# Patient Record
Sex: Male | Born: 1985 | Race: White | Hispanic: No | Marital: Single
Health system: Midwestern US, Community
[De-identification: ages and names within clinical notes are randomized; demographics above are authoritative.]

## PROBLEM LIST (undated history)

## (undated) DIAGNOSIS — Z9114 Patient's other noncompliance with medication regimen: Secondary | ICD-10-CM

## (undated) DIAGNOSIS — F329 Major depressive disorder, single episode, unspecified: Secondary | ICD-10-CM

## (undated) DIAGNOSIS — D649 Anemia, unspecified: Secondary | ICD-10-CM

## (undated) DIAGNOSIS — F250 Schizoaffective disorder, bipolar type: Secondary | ICD-10-CM

## (undated) DIAGNOSIS — J189 Pneumonia, unspecified organism: Secondary | ICD-10-CM

## (undated) DIAGNOSIS — M1990 Unspecified osteoarthritis, unspecified site: Secondary | ICD-10-CM

## (undated) DIAGNOSIS — F1910 Other psychoactive substance abuse, uncomplicated: Secondary | ICD-10-CM

## (undated) DIAGNOSIS — E1110 Type 2 diabetes mellitus with ketoacidosis without coma: Secondary | ICD-10-CM

## (undated) DIAGNOSIS — K219 Gastro-esophageal reflux disease without esophagitis: Secondary | ICD-10-CM

## (undated) DIAGNOSIS — B019 Varicella without complication: Secondary | ICD-10-CM

## (undated) DIAGNOSIS — F419 Anxiety disorder, unspecified: Secondary | ICD-10-CM

## (undated) DIAGNOSIS — F32A Depression, unspecified: Secondary | ICD-10-CM

## (undated) DIAGNOSIS — Z91148 Patient's other noncompliance with medication regimen for other reason: Secondary | ICD-10-CM

## (undated) DIAGNOSIS — J45909 Unspecified asthma, uncomplicated: Secondary | ICD-10-CM

## (undated) DIAGNOSIS — M419 Scoliosis, unspecified: Secondary | ICD-10-CM

## (undated) DIAGNOSIS — I10 Essential (primary) hypertension: Secondary | ICD-10-CM

## (undated) DIAGNOSIS — G8929 Other chronic pain: Secondary | ICD-10-CM

## (undated) DIAGNOSIS — E109 Type 1 diabetes mellitus without complications: Secondary | ICD-10-CM

## (undated) DIAGNOSIS — F259 Schizoaffective disorder, unspecified: Secondary | ICD-10-CM

## (undated) DIAGNOSIS — G43909 Migraine, unspecified, not intractable, without status migrainosus: Secondary | ICD-10-CM

## (undated) HISTORY — DX: Varicella without complication: B01.9

## (undated) HISTORY — DX: Essential (primary) hypertension: I10

---

## 2011-05-31 ENCOUNTER — Inpatient Hospital Stay (INDEPENDENT_AMBULATORY_CARE_PROVIDER_SITE_OTHER)
Admission: RE | Admit: 2011-05-31 | Discharge: 2011-05-31 | Disposition: A | Discharge status: Home / Self Care | Payer: Medicare Other | Source: Ambulatory Visit | Attending: Family Medicine | Admitting: Family Medicine

## 2011-05-31 ENCOUNTER — Inpatient Hospital Stay (HOSPITAL_COMMUNITY)
Admission: EM | Admit: 2011-05-31 | Discharge: 2011-06-01 | DRG: 639 | Disposition: A | Discharge status: Home / Self Care | Payer: Self-pay | Attending: Emergency Medicine | Admitting: Emergency Medicine

## 2011-05-31 ENCOUNTER — Emergency Department (HOSPITAL_COMMUNITY): Payer: Self-pay

## 2011-05-31 DIAGNOSIS — R824 Acetonuria: Secondary | ICD-10-CM

## 2011-05-31 DIAGNOSIS — F209 Schizophrenia, unspecified: Secondary | ICD-10-CM

## 2011-05-31 DIAGNOSIS — Z79899 Other long term (current) drug therapy: Secondary | ICD-10-CM

## 2011-05-31 DIAGNOSIS — F329 Major depressive disorder, single episode, unspecified: Secondary | ICD-10-CM | POA: Diagnosis present

## 2011-05-31 DIAGNOSIS — F259 Schizoaffective disorder, unspecified: Secondary | ICD-10-CM | POA: Diagnosis present

## 2011-05-31 DIAGNOSIS — E119 Type 2 diabetes mellitus without complications: Principal | ICD-10-CM | POA: Diagnosis present

## 2011-05-31 DIAGNOSIS — Z794 Long term (current) use of insulin: Secondary | ICD-10-CM

## 2011-05-31 DIAGNOSIS — F3289 Other specified depressive episodes: Secondary | ICD-10-CM | POA: Diagnosis present

## 2011-05-31 LAB — COMPREHENSIVE METABOLIC PANEL
ALT: 28 U/L (ref 0–53)
AST: 16 U/L (ref 0–37)
Albumin: 4.2 g/dL (ref 3.5–5.2)
Calcium: 9.7 mg/dL (ref 8.4–10.5)
Creatinine, Ser: 0.56 mg/dL (ref 0.4–1.5)
Sodium: 121 mEq/L — ABNORMAL LOW (ref 135–145)
Total Protein: 6.6 g/dL (ref 6.0–8.3)

## 2011-05-31 LAB — URINALYSIS, ROUTINE W REFLEX MICROSCOPIC
Bilirubin Urine: NEGATIVE
Glucose, UA: >1000 mg/dL — AB
Hgb urine dipstick: NEGATIVE
Specific Gravity, Urine: 1.043 — ABNORMAL HIGH (ref 1.005–1.030)
Urobilinogen, UA: 0.2 mg/dL (ref 0.0–1.0)
pH: 5.5 (ref 5.0–8.0)

## 2011-05-31 LAB — POCT I-STAT, CHEM 8
Calcium, Ion: 1.21 mmol/L (ref 1.12–1.32)
Chloride: 94 mEq/L — ABNORMAL LOW (ref 96–112)
Glucose, Bld: 479 mg/dL — ABNORMAL HIGH (ref 70–99)
HCT: 45 % (ref 39.0–52.0)
Hemoglobin: 15.3 g/dL (ref 13.0–17.0)
Potassium: 4.3 mEq/L (ref 3.5–5.1)

## 2011-05-31 LAB — POCT URINALYSIS DIP (DEVICE)
Leukocytes, UA: NEGATIVE
Protein, ur: NEGATIVE mg/dL
Urobilinogen, UA: 0.2 mg/dL (ref 0.0–1.0)
pH: 6 (ref 5.0–8.0)

## 2011-05-31 LAB — POCT I-STAT 3, ART BLOOD GAS (G3+)
O2 Saturation: 95 %
pCO2 arterial: 38.4 mmHg (ref 35.0–45.0)
pH, Arterial: 7.362 (ref 7.350–7.450)

## 2011-05-31 LAB — CBC
HCT: 41.6 % (ref 39.0–52.0)
MCHC: 37 g/dL — ABNORMAL HIGH (ref 30.0–36.0)
MCV: 84 fL (ref 78.0–100.0)
Platelets: 351 10*3/uL (ref 150–400)
RDW: 12.4 % (ref 11.5–15.5)
WBC: 12.2 10*3/uL — ABNORMAL HIGH (ref 4.0–10.5)

## 2011-05-31 LAB — DIFFERENTIAL
Basophils Absolute: 0.1 10*3/uL (ref 0.0–0.1)
Eosinophils Absolute: 0.1 10*3/uL (ref 0.0–0.7)
Eosinophils Relative: 1 % (ref 0–5)
Lymphocytes Relative: 16 % (ref 12–46)
Monocytes Absolute: 1.1 10*3/uL — ABNORMAL HIGH (ref 0.1–1.0)

## 2011-05-31 LAB — URINE MICROSCOPIC-ADD ON

## 2011-05-31 LAB — GLUCOSE, CAPILLARY
Glucose-Capillary: 585 mg/dL (ref 70–99)
Glucose-Capillary: 474 mg/dL — ABNORMAL HIGH (ref 70–99)
Glucose-Capillary: 333 mg/dL — ABNORMAL HIGH (ref 70–99)
Glucose-Capillary: 276 mg/dL — ABNORMAL HIGH (ref 70–99)
Glucose-Capillary: 226 mg/dL — ABNORMAL HIGH (ref 70–99)
Glucose-Capillary: 160 mg/dL — ABNORMAL HIGH (ref 70–99)

## 2011-06-01 LAB — GLUCOSE, CAPILLARY
Glucose-Capillary: 149 mg/dL — ABNORMAL HIGH (ref 70–99)
Glucose-Capillary: 174 mg/dL — ABNORMAL HIGH (ref 70–99)

## 2011-06-08 ENCOUNTER — Emergency Department (HOSPITAL_COMMUNITY)
Admission: EM | Admit: 2011-06-08 | Discharge: 2011-06-08 | Disposition: A | Discharge status: Home / Self Care | Payer: Self-pay | Attending: Emergency Medicine | Admitting: Emergency Medicine

## 2011-06-08 DIAGNOSIS — Z794 Long term (current) use of insulin: Secondary | ICD-10-CM | POA: Insufficient documentation

## 2011-06-08 DIAGNOSIS — E119 Type 2 diabetes mellitus without complications: Secondary | ICD-10-CM | POA: Insufficient documentation

## 2011-06-08 DIAGNOSIS — R000 Tachycardia, unspecified: Secondary | ICD-10-CM | POA: Insufficient documentation

## 2011-06-08 DIAGNOSIS — R5381 Other malaise: Secondary | ICD-10-CM | POA: Insufficient documentation

## 2011-06-08 LAB — BASIC METABOLIC PANEL
CO2: 28 mEq/L (ref 19–32)
Calcium: 9.8 mg/dL (ref 8.4–10.5)
Creatinine, Ser: 0.53 mg/dL (ref 0.50–1.35)
GFR calc non Af Amer: >60 mL/min (ref >60)
Glucose, Bld: 310 mg/dL — ABNORMAL HIGH (ref 70–99)

## 2011-06-08 LAB — GLUCOSE, CAPILLARY

## 2011-06-19 ENCOUNTER — Emergency Department (HOSPITAL_BASED_OUTPATIENT_CLINIC_OR_DEPARTMENT_OTHER)
Admission: EM | Admit: 2011-06-19 | Discharge: 2011-06-20 | Disposition: A | Discharge status: Other Acute Inpatient Hospital | Payer: Self-pay | Attending: Emergency Medicine | Admitting: Emergency Medicine

## 2011-06-19 DIAGNOSIS — F29 Unspecified psychosis not due to a substance or known physiological condition: Secondary | ICD-10-CM | POA: Insufficient documentation

## 2011-06-19 DIAGNOSIS — E119 Type 2 diabetes mellitus without complications: Secondary | ICD-10-CM | POA: Insufficient documentation

## 2011-06-19 LAB — URINALYSIS, ROUTINE W REFLEX MICROSCOPIC
Hgb urine dipstick: NEGATIVE
Nitrite: NEGATIVE
Specific Gravity, Urine: 1.038 — ABNORMAL HIGH (ref 1.005–1.030)
Urobilinogen, UA: 0.2 mg/dL (ref 0.0–1.0)
pH: 6.5 (ref 5.0–8.0)

## 2011-06-19 LAB — CBC
HCT: 42 % (ref 39.0–52.0)
MCV: 84.5 fL (ref 78.0–100.0)
RBC: 4.97 MIL/uL (ref 4.22–5.81)
RDW: 11.9 % (ref 11.5–15.5)
WBC: 7.9 10*3/uL (ref 4.0–10.5)

## 2011-06-19 LAB — DIFFERENTIAL
Basophils Absolute: 0.1 10*3/uL (ref 0.0–0.1)
Eosinophils Relative: 3 % (ref 0–5)
Lymphocytes Relative: 29 % (ref 12–46)
Lymphs Abs: 2.3 10*3/uL (ref 0.7–4.0)
Neutro Abs: 4.5 10*3/uL (ref 1.7–7.7)
Neutrophils Relative %: 58 % (ref 43–77)

## 2011-06-19 LAB — URINE MICROSCOPIC-ADD ON

## 2011-06-19 LAB — COMPREHENSIVE METABOLIC PANEL
Alkaline Phosphatase: 85 U/L (ref 39–117)
BUN: 20 mg/dL (ref 6–23)
Chloride: 98 mEq/L (ref 96–112)
Creatinine, Ser: <0.47 mg/dL — ABNORMAL LOW (ref 0.50–1.35)
Glucose, Bld: 211 mg/dL — ABNORMAL HIGH (ref 70–99)
Potassium: 3.8 mEq/L (ref 3.5–5.1)
Total Bilirubin: 0.4 mg/dL (ref 0.3–1.2)

## 2011-06-19 LAB — ETHANOL: Alcohol, Ethyl (B): <11 mg/dL (ref 0–11)

## 2011-06-19 LAB — RAPID URINE DRUG SCREEN, HOSP PERFORMED
Amphetamines: POSITIVE — AB
Cocaine: NOT DETECTED
Opiates: NOT DETECTED

## 2011-06-20 LAB — GLUCOSE, CAPILLARY
Glucose-Capillary: 109 mg/dL — ABNORMAL HIGH (ref 70–99)
Glucose-Capillary: 327 mg/dL — ABNORMAL HIGH (ref 70–99)

## 2011-08-10 ENCOUNTER — Emergency Department (HOSPITAL_COMMUNITY)
Admission: EM | Admit: 2011-08-10 | Discharge: 2011-08-10 | Disposition: A | Discharge status: Home / Self Care | Payer: Self-pay | Attending: Emergency Medicine | Admitting: Emergency Medicine

## 2011-08-10 DIAGNOSIS — Z794 Long term (current) use of insulin: Secondary | ICD-10-CM | POA: Insufficient documentation

## 2011-08-10 DIAGNOSIS — Z79899 Other long term (current) drug therapy: Secondary | ICD-10-CM | POA: Insufficient documentation

## 2011-08-10 DIAGNOSIS — F329 Major depressive disorder, single episode, unspecified: Secondary | ICD-10-CM | POA: Insufficient documentation

## 2011-08-10 DIAGNOSIS — E119 Type 2 diabetes mellitus without complications: Secondary | ICD-10-CM | POA: Insufficient documentation

## 2011-08-10 DIAGNOSIS — R350 Frequency of micturition: Secondary | ICD-10-CM | POA: Insufficient documentation

## 2011-08-10 DIAGNOSIS — F3289 Other specified depressive episodes: Secondary | ICD-10-CM | POA: Insufficient documentation

## 2011-08-10 LAB — POCT I-STAT, CHEM 8
BUN: 9 mg/dL (ref 6–23)
Calcium, Ion: 1.21 mmol/L (ref 1.12–1.32)
Chloride: 99 mEq/L (ref 96–112)
Creatinine, Ser: 0.5 mg/dL (ref 0.50–1.35)
Glucose, Bld: 390 mg/dL — ABNORMAL HIGH (ref 70–99)
HCT: 46 % (ref 39.0–52.0)
Hemoglobin: 15.6 g/dL (ref 13.0–17.0)
Potassium: 3.6 mEq/L (ref 3.5–5.1)
Sodium: 135 mEq/L (ref 135–145)
TCO2: 25 mmol/L (ref 0–100)

## 2011-08-10 LAB — GLUCOSE, CAPILLARY
Glucose-Capillary: 297 mg/dL — ABNORMAL HIGH (ref 70–99)
Glucose-Capillary: 254 mg/dL — ABNORMAL HIGH (ref 70–99)

## 2011-08-11 ENCOUNTER — Inpatient Hospital Stay (INDEPENDENT_AMBULATORY_CARE_PROVIDER_SITE_OTHER)
Admission: RE | Admit: 2011-08-11 | Discharge: 2011-08-11 | Disposition: A | Discharge status: Home / Self Care | Payer: Self-pay | Source: Ambulatory Visit | Attending: Emergency Medicine | Admitting: Emergency Medicine

## 2011-08-11 DIAGNOSIS — Z760 Encounter for issue of repeat prescription: Secondary | ICD-10-CM

## 2011-08-11 LAB — GLUCOSE, CAPILLARY: Glucose-Capillary: 441 mg/dL — ABNORMAL HIGH (ref 70–99)

## 2011-08-13 ENCOUNTER — Inpatient Hospital Stay (HOSPITAL_COMMUNITY)
Admission: EM | Admit: 2011-08-13 | Discharge: 2011-08-14 | DRG: 081 | Disposition: A | Discharge status: Home / Self Care | Payer: Self-pay | Attending: Family Medicine | Admitting: Family Medicine

## 2011-08-13 DIAGNOSIS — Z794 Long term (current) use of insulin: Secondary | ICD-10-CM

## 2011-08-13 DIAGNOSIS — T43505A Adverse effect of unspecified antipsychotics and neuroleptics, initial encounter: Secondary | ICD-10-CM | POA: Diagnosis present

## 2011-08-13 DIAGNOSIS — F259 Schizoaffective disorder, unspecified: Secondary | ICD-10-CM

## 2011-08-13 DIAGNOSIS — F329 Major depressive disorder, single episode, unspecified: Secondary | ICD-10-CM | POA: Diagnosis present

## 2011-08-13 DIAGNOSIS — T4275XA Adverse effect of unspecified antiepileptic and sedative-hypnotic drugs, initial encounter: Secondary | ICD-10-CM | POA: Diagnosis present

## 2011-08-13 DIAGNOSIS — IMO0001 Reserved for inherently not codable concepts without codable children: Secondary | ICD-10-CM | POA: Diagnosis present

## 2011-08-13 DIAGNOSIS — R404 Transient alteration of awareness: Principal | ICD-10-CM | POA: Diagnosis present

## 2011-08-13 DIAGNOSIS — F3289 Other specified depressive episodes: Secondary | ICD-10-CM | POA: Diagnosis present

## 2011-08-13 DIAGNOSIS — E876 Hypokalemia: Secondary | ICD-10-CM | POA: Diagnosis present

## 2011-08-13 LAB — URINALYSIS, ROUTINE W REFLEX MICROSCOPIC
Ketones, ur: 40 mg/dL — AB
Leukocytes, UA: NEGATIVE
Nitrite: NEGATIVE
pH: 6.5 (ref 5.0–8.0)

## 2011-08-13 LAB — CBC
MCH: 30.4 pg (ref 26.0–34.0)
MCV: 82.3 fL (ref 78.0–100.0)
Platelets: 196 10*3/uL (ref 150–400)
RDW: 12.3 % (ref 11.5–15.5)
WBC: 4.9 10*3/uL (ref 4.0–10.5)

## 2011-08-13 LAB — GLUCOSE, CAPILLARY
Glucose-Capillary: 267 mg/dL — ABNORMAL HIGH (ref 70–99)
Glucose-Capillary: 411 mg/dL — ABNORMAL HIGH (ref 70–99)
Glucose-Capillary: 428 mg/dL — ABNORMAL HIGH (ref 70–99)

## 2011-08-13 LAB — COMPREHENSIVE METABOLIC PANEL
BUN: 8 mg/dL (ref 6–23)
Calcium: 9.1 mg/dL (ref 8.4–10.5)
Creatinine, Ser: <0.47 mg/dL — ABNORMAL LOW (ref 0.50–1.35)
Glucose, Bld: 394 mg/dL — ABNORMAL HIGH (ref 70–99)
Total Protein: 6.2 g/dL (ref 6.0–8.3)

## 2011-08-13 LAB — RAPID URINE DRUG SCREEN, HOSP PERFORMED
Barbiturates: NOT DETECTED
Benzodiazepines: NOT DETECTED

## 2011-08-13 LAB — DIFFERENTIAL
Eosinophils Absolute: 0.1 10*3/uL (ref 0.0–0.7)
Eosinophils Relative: 2 % (ref 0–5)
Lymphs Abs: 1.1 10*3/uL (ref 0.7–4.0)
Monocytes Relative: 12 % (ref 3–12)

## 2011-08-13 LAB — SALICYLATE LEVEL: Salicylate Lvl: <2 mg/dL — ABNORMAL LOW (ref 2.8–20.0)

## 2011-08-13 LAB — URINE MICROSCOPIC-ADD ON

## 2011-08-14 LAB — GLUCOSE, CAPILLARY

## 2011-08-19 ENCOUNTER — Inpatient Hospital Stay (HOSPITAL_COMMUNITY)
Admission: EM | Admit: 2011-08-19 | Discharge: 2011-08-20 | DRG: 918 | Discharge status: Against Medical Advice | Payer: Self-pay | Attending: Internal Medicine | Admitting: Internal Medicine

## 2011-08-19 ENCOUNTER — Emergency Department (HOSPITAL_COMMUNITY): Payer: Self-pay

## 2011-08-19 DIAGNOSIS — IMO0002 Reserved for concepts with insufficient information to code with codable children: Secondary | ICD-10-CM | POA: Diagnosis present

## 2011-08-19 DIAGNOSIS — Z91199 Patient's noncompliance with other medical treatment and regimen due to unspecified reason: Secondary | ICD-10-CM

## 2011-08-19 DIAGNOSIS — R4182 Altered mental status, unspecified: Secondary | ICD-10-CM | POA: Diagnosis present

## 2011-08-19 DIAGNOSIS — Z882 Allergy status to sulfonamides status: Secondary | ICD-10-CM

## 2011-08-19 DIAGNOSIS — Z23 Encounter for immunization: Secondary | ICD-10-CM

## 2011-08-19 DIAGNOSIS — Z5900 Homelessness unspecified: Secondary | ICD-10-CM

## 2011-08-19 DIAGNOSIS — T50901A Poisoning by unspecified drugs, medicaments and biological substances, accidental (unintentional), initial encounter: Principal | ICD-10-CM | POA: Diagnosis present

## 2011-08-19 DIAGNOSIS — Z794 Long term (current) use of insulin: Secondary | ICD-10-CM

## 2011-08-19 DIAGNOSIS — Z79899 Other long term (current) drug therapy: Secondary | ICD-10-CM

## 2011-08-19 DIAGNOSIS — F17200 Nicotine dependence, unspecified, uncomplicated: Secondary | ICD-10-CM | POA: Diagnosis present

## 2011-08-19 DIAGNOSIS — Z560 Unemployment, unspecified: Secondary | ICD-10-CM

## 2011-08-19 DIAGNOSIS — F3289 Other specified depressive episodes: Secondary | ICD-10-CM | POA: Diagnosis present

## 2011-08-19 DIAGNOSIS — F329 Major depressive disorder, single episode, unspecified: Secondary | ICD-10-CM | POA: Diagnosis present

## 2011-08-19 DIAGNOSIS — F259 Schizoaffective disorder, unspecified: Secondary | ICD-10-CM | POA: Diagnosis present

## 2011-08-19 DIAGNOSIS — E1065 Type 1 diabetes mellitus with hyperglycemia: Secondary | ICD-10-CM | POA: Diagnosis present

## 2011-08-19 DIAGNOSIS — Z9119 Patient's noncompliance with other medical treatment and regimen: Secondary | ICD-10-CM

## 2011-08-19 DIAGNOSIS — R55 Syncope and collapse: Secondary | ICD-10-CM | POA: Diagnosis present

## 2011-08-19 LAB — DIFFERENTIAL
Lymphocytes Relative: 18 % (ref 12–46)
Lymphs Abs: 1.3 10*3/uL (ref 0.7–4.0)
Neutrophils Relative %: 75 % (ref 43–77)

## 2011-08-19 LAB — RAPID URINE DRUG SCREEN, HOSP PERFORMED
Amphetamines: POSITIVE — AB
Barbiturates: NOT DETECTED
Benzodiazepines: NOT DETECTED
Tetrahydrocannabinol: NOT DETECTED

## 2011-08-19 LAB — SALICYLATE LEVEL: Salicylate Lvl: <2 mg/dL — ABNORMAL LOW (ref 2.8–20.0)

## 2011-08-19 LAB — COMPREHENSIVE METABOLIC PANEL
AST: 8 U/L (ref 0–37)
Albumin: 3.5 g/dL (ref 3.5–5.2)
CO2: 25 mEq/L (ref 19–32)
Calcium: 9.6 mg/dL (ref 8.4–10.5)
Creatinine, Ser: 0.58 mg/dL (ref 0.50–1.35)
GFR calc non Af Amer: >60 mL/min (ref >60)

## 2011-08-19 LAB — CBC
HCT: 37.2 % — ABNORMAL LOW (ref 39.0–52.0)
Hemoglobin: 13.7 g/dL (ref 13.0–17.0)
MCV: 82.1 fL (ref 78.0–100.0)
RBC: 4.53 MIL/uL (ref 4.22–5.81)
WBC: 7 10*3/uL (ref 4.0–10.5)

## 2011-08-19 LAB — CK TOTAL AND CKMB (NOT AT ARMC)
CK, MB: 2.3 ng/mL (ref 0.3–4.0)
Relative Index: INVALID (ref 0.0–2.5)

## 2011-08-19 LAB — URINALYSIS, ROUTINE W REFLEX MICROSCOPIC
Bilirubin Urine: NEGATIVE
Hgb urine dipstick: NEGATIVE
Protein, ur: NEGATIVE mg/dL
Urobilinogen, UA: 0.2 mg/dL (ref 0.0–1.0)
pH: 7 (ref 5.0–8.0)

## 2011-08-19 LAB — GLUCOSE, CAPILLARY: Glucose-Capillary: 298 mg/dL — ABNORMAL HIGH (ref 70–99)

## 2011-08-19 LAB — URINE MICROSCOPIC-ADD ON

## 2011-08-19 LAB — TROPONIN I: Troponin I: <0.3 ng/mL (ref <0.30)

## 2011-08-19 LAB — ACETAMINOPHEN LEVEL: Acetaminophen (Tylenol), Serum: <15 ug/mL (ref 10–30)

## 2011-08-20 LAB — CBC
HCT: 33.8 % — ABNORMAL LOW (ref 39.0–52.0)
Hemoglobin: 12.1 g/dL — ABNORMAL LOW (ref 13.0–17.0)
WBC: 6.5 10*3/uL (ref 4.0–10.5)

## 2011-08-20 LAB — COMPREHENSIVE METABOLIC PANEL
ALT: 15 U/L (ref 0–53)
Albumin: 2.8 g/dL — ABNORMAL LOW (ref 3.5–5.2)
Alkaline Phosphatase: 58 U/L (ref 39–117)
Potassium: 3.8 mEq/L (ref 3.5–5.1)
Sodium: 137 mEq/L (ref 135–145)
Total Protein: 5.2 g/dL — ABNORMAL LOW (ref 6.0–8.3)

## 2011-08-20 LAB — GLUCOSE, CAPILLARY: Glucose-Capillary: 252 mg/dL — ABNORMAL HIGH (ref 70–99)

## 2011-08-20 LAB — LIPID PANEL
Cholesterol: 143 mg/dL (ref 0–200)
Total CHOL/HDL Ratio: 3.4 RATIO
VLDL: 22 mg/dL (ref 0–40)

## 2011-08-20 LAB — TROPONIN I: Troponin I: <0.3 ng/mL (ref <0.30)

## 2011-08-20 LAB — CK TOTAL AND CKMB (NOT AT ARMC)
CK, MB: 2.5 ng/mL (ref 0.3–4.0)
Relative Index: INVALID (ref 0.0–2.5)
Relative Index: INVALID (ref 0.0–2.5)
Total CK: 35 U/L (ref 7–232)

## 2011-08-22 NOTE — H&P (Signed)
NAMETHEADOR, JEZEWSKI NO.:  [id number]  MEDICAL RECORD NO.:  [id number]  LOCATION:  3302                         FACILITY:  [facility]  PHYSICIAN:  Isidor Holts, M.D.  DATE OF BIRTH:  10/23/1986  DATE OF ADMISSION:  08/19/2011 DATE OF DISCHARGE:                             HISTORY & PHYSICAL   PRIMARY MD:  Dr. Allena Katz, [facility].  CHIEF COMPLAINT:  Passed out, altered mental status, and possible drug overdose.  HISTORY OF PRESENT ILLNESS:  This is a 25 year old male, who is status post hospitalization from August 13, 2011 to August 14, 2011, for possible drug overdose including benzodiazepines, homelessness, and unemployed.  The patient is currently rambling, somewhat confused and hypersomnolent in the Emergency Department, so history is in the main, gleaned from ED MD and what can be made out from the patient's speech. According to ED MD, the patient walked into a restaurant to use the bathroom and then passed out.  EMS was called.  As fas as could be determined from the patient, he still homeless and had felt somewhat unwell and dizzy with blurred vision yesterday, ended up sleeping in a park close to restaurant.  Then, today he had woken up, gotten to the restaurant, asked to use the bathroom and then felt very weak, lay on the floor and "must have passed out."  He was brought to the Emergency Department.  He denies having taken "too many of my medications."  He states that he is currently taking Adderall, Geodon, trazodone, and Depakote.  He never utilized any of the other medications that were prescribed upon discharge.  PAST MEDICAL HISTORY: 1. Status post hospitalization from August 13, 2011 to August 14, 2011     for possible drug overdose, including benzodiazepines. 2. Diabetes mellitus type I. 3. Depression. 4. Schizoaffective disorder. 5. Homelessness. 6. Smoking history.  ALLERGIES:  Sulfa.  MEDICATIONS:  The patient currently takes the  following: 1. Adderall 10 mg p.o. daily. 2. Depakote ER 1000 mg p.o. at bedtime. 3. Geodon (80 mg) 2 tablets p.o. at bedtime. 4. Trazodone 300 mg p.o. at bedtime.  In addition to these, he was discharged on the following: 1. Humalog insulin subcutaneously per sliding scale. 2. Lantus 14 subcutaneously at bedtime. 3. Clonidine 0.1 mg p.o. at bedtime. 4. Clonazepam 0.5 mg p.o. p.r.n. t.i.d. for anxiety. 5. Ibuprofen 200 mg p.o. p.r.n. every 8 hourly for pain.  As described     above, the patient says that he does not take these later     medications, especially not since discharge from his latest     hospitalization.  REVIEW OF SYSTEMS:  Unobtainable at this time, although the patient denies abdominal pain, vomiting or diarrhea, cough, shortness of breath, or fever.  SOCIAL HISTORY:  The patient is currently unemployed, homeless, single, and smokes about half a pack of cigarettes per day.  Has no history of "illegal drug abuse or alcohol abuse."  FAMILY HISTORY:  According to the patient, both parents are living. However, he does not know how old they are or what their health status is.  PHYSICAL EXAMINATION:  VITAL SIGNS:  Temperature 98.7, blood pressure 111/78 mmHg, respiratory 16, heart rate 77 per  minute and regular, and pulse oximeter 100% on 2 L of oxygen.  The patient at the present time was moaning, stating that Foley catheter was "very painful."  Otherwise, rambling, although he intermittently does make sense allowing for a limited history to be obtained, otherwise not seen to be in obvious acute distress. HEENT:  No clinical pallor, no jaundice.  No conjunctival injection. Pupils are equally reactive normally to light.  Visible mucous membranes appear moist. NECK:  Supple.  JVP not seen.  No palpable lymphadenopathy.  No palpable goiter. CHEST:  Clinically clear to auscultation.  No wheezes or crackles. HEART:  S1, S2 heard normal, regular.  No murmurs. ABDOMEN:   Scaphoid.  Soft, nontender.  No palpable organomegaly or palpable masses.  Normal bowel sounds. LOWER EXTREMITY:  No pitting edema.  Palpable peripheral pulses. MUSCULOSKELETAL:  Appears unremarkable. CENTRAL NERVOUS SYSTEM:  No focal neurologic deficit on gross examination.  INVESTIGATIONS:  CBC; WBC 7.0, hemoglobin 13.7, hematocrit 82.1, and platelets 276.  Electrolytes; sodium 140, potassium 4.4, chloride 101, CO2 of 25, BUN 16, creatinine 0.58, and glucose 562.  CBG of 298.  Urine drug screen is positive only for amphetamines, alcohol level is less than 11.  Urinalysis is negative.  Chest x-ray on August 19, 2011, shows no acute cardiopulmonary disease.  Head CT scan on August 19, 2011, shows no acute intracranial abnormality.  A 12-lead EKG August 19, 2011, shows sinus rhythm regular, 96 per minute, left axis deviation, old Q-waves in V1 and V2, otherwise no acute ischemic changes.  This EKG is essentially unchanged from 12-lead EKG of August 13, 2011.  ASSESSMENT AND PLAN: 1. Syncopal episode/hypersomnolence and altered mental status.     Etiology is unclear at the present time, although this may be     indeed medication-induced and possibly secondary to inadvertent     overdose.  However, unable to substantiate for the present time.     Management will be supportive, with intravenous fluid hydration,     observation, regular neuro checks, telemetric monitoring.  The     patient will need step-down unit initially for close     observation, although I believe this will be for an exceedingly short     time.  2. Uncontrolled type I diabetes mellitus.  This is of course, secondary     to noncompliance with medications.  We shall commence the patient     on sliding scale insulin coverage for now and then adjust     medications as indicated.  3. Smoking history.  The patient was commenced on NicoDerm CQ patch,     however we will attempt to counsel him when altered mental  status     resolved.  4. Homelessness.  We shall involve clinical social worker for     appropriate input.  5. Psychiatric problems.  Unable to assess the patient's psychiatric     state at the present time.  However, he may indeed need psychiatric     reevaluation prior to discharge, given his background psychiatric     problems.     Further management will depend on clinical course.     Isidor Holts, M.D.     CO/MEDQ  D:  08/19/2011  T:  08/19/2011  Job:  [id number]  cc:   Dr. Allena Katz  Electronically Signed by Isidor Holts M.D. on 08/22/2011 04:54:09 PM

## 2011-08-24 NOTE — Discharge Summary (Signed)
[name], [name]:  [id number]  MEDICAL RECORD NO.:  [id number]  LOCATION:  1518                         FACILITY:  [facility]  PHYSICIAN:  Brendia Sacks, MD    DATE OF BIRTH:  1986/01/08  DATE OF ADMISSION:  08/13/2011 DATE OF DISCHARGE:  08/14/2011                              DISCHARGE SUMMARY   Primary care services include [facility] as well as [facility] for counseling and prescription medication.  Also the patient is going to be set up with [facility] [place].  CONDITION ON DISCHARGE:  Improved.  DISPOSITION:  Home.  DISCHARGE DIAGNOSES: 1. Somnolence secondary to prescribed psychiatric medications. 2. Report on admission of overdose, doubted. 3. Diabetes mellitus type 2, uncontrolled. 4. Depression. 5. Schizoaffective disorder.  HISTORY OF PRESENT ILLNESS:  This is a 25 year old man who was staying with friends and was brought to the emergency room for questionable history of overdose.  Poor history from EMS obtained from friends, the patient had been taking a lot of Klonopin.  There is concern for overdose.  However, the patient notes that with his prescribed medications, which include Depakote, Klonopin, Geodon, and trazodone that it is very difficult to arouse him from sleep.  He feels that most likely his friends tried to arouse him and when they were unable, they became concerned.  He adamantly denied any suicidal or homicidal ideation.  He denied suicide attempt and denied an overdose.  HOSPITAL COURSE:  [name] was admitted to the medical floor with a sitter.  He was seen in consultation with Psychiatry, who has cleared him for discharge home.  He is not felt to need any inpatient psychiatric services and not felt to be a danger to himself or to others.  No changes to his medication regimen were recommended.  The patient was monitored on telemetry, had no arrhythmias and is feeling quite well.  CONSULTATIONS:  Psychiatry,  recommendations as above.  PROCEDURES:  None.  IMAGING:  None.  MICROBIOLOGY:  None.  PERTINENT LABORATORY STUDIES: 1. Urine drug screen was negative to tricyclics.  Urine screen was     negative. 2. Acetaminophen level was negative, alcohol level was negative and     salicylate level was negative. 3. CBC was unremarkable. 4. Capillary blood sugar was moderately elevated; however, the patient     tells me that he has been on 40 units of Lantus, not 25 as medical     reconciliation reported. 5. Basic metabolic panel was otherwise unremarkable. 6. Urinalysis was essentially negative.  DISCHARGE INSTRUCTIONS:  The patient will be discharged home.  He is currently homeless and therefore he has been seen by social work and has been given bus passes as well as information on shelters.  He has also been set up with [facility] by social work and will continue with [facility] [facility] as well.  Social work is also in the process of trying to arrange [employer] [facility] [place] with him.  DISCHARGE INSTRUCTIONS:  As above.  ACTIVITIES:  Unrestricted.  DIET:  Recommend a diabetic diet.  DISCHARGE MEDICATIONS:  Nicotine transdermal patch 40 mg per 24 hours daily to skin if not smoking.  Resume the following home medications.  1. Humalog sliding scale as directed by his primary care physician.     Note, I  made no changes to this too. 2. Lantus 40 units subcutaneous q.h.s.  Note that this is actually his     chronic home dose. 3. Adderall 10 mg 1 tablet p.o. daily. 4. Clonazepam 0.5 mg p.o. t.i.d. as needed for anxiety. 5. Clonidine 0.1 mg p.o. q.h.s. 6. Depakote ER 500 mg 2 tablets p.o. q.h.s. 7. Geodon 80 mg 2 tablets p.o. q.h.s. 8. Ibuprofen 200 mg every 8 hours as needed for pain. 9. Trazodone 100 mg 3 tablets p.o. q.h.s.  The patient should continue to follow with counseling as well as follow up for his diabetes.  Time coordinating discharge is 26 minutes.     Brendia Sacks,  MD     DG/MEDQ  D:  08/14/2011  T:  08/14/2011  Job:  [id number]  Electronically Signed by Brendia Sacks  on 08/24/2011 09:59:56 PM

## 2011-08-24 NOTE — Consult Note (Signed)
[name], [name]:  [id number]  MEDICAL RECORD NO.:  [id number]  LOCATION:  1518                         FACILITY:  [facility]  PHYSICIAN:  Conni Slipper, MDDATE OF BIRTH:  [birth year]  DATE OF CONSULTATION:  08/13/2011 DATE OF DISCHARGE:                                CONSULTATION   HISTORY:  [name] is a 25 year old single Caucasian male who was [name] with questionable overdose on his medications, hypokalemia, and schizoaffective disorder.  The patient was brought into the hospital by the EMS when his roommate from the [facility] called regarding unresponsive behavior.  [name] stated that he has been in [place] about 2 months initially came to live with a friend from his school and later he was found his friend is a Higher education careers adviser and robed him and left him at the shelter.  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  The patient reported that he has been suffering with auditory hallucinations, mostly conversations, constantly going on his head which resulted given a diagnosis of schizophrenia in 2008 in [place].  The patient reportedly has a suicidal attempts resulted being admitted over there.  The patient reported his recent admission was couple of weeks ago in [place] when he tried to abuse the system by telling them he was suicidal when actually he was homeless.  The patient reportedly taking medications Depakote 1000 mg a day, Geodon 160 mg a day, trazodone up to 450 mg a day from [facility] [place].  He has reported name of the doctor is Dr. Migdalia Dk.  PAST PSYCHIATRIC HISTORY:  Significant for both inpatient and outpatient psychiatric services since 2008.  He was on medication like  Seroquel, Invega, Haldol, Cogentin, and Klonopin in the past.  The patient reported currently he does not take Klonopin, but he takes Depakote, Geodon, and trazodone.  He does not have a history of substance abuse or alcohol.  Medically, there is a questionable diabetes, the patient was not conformed it.  ALLERGIES:  No known drug allergies.  Psychosocially, the patient reportedly lived in [place] and [place]. His mom was living with his sister who is not doing well.  His brother was not found and his step-dad was not helpful.  He has aunt who kill herself with overdose, also suffered with a mental illness.  SOCIAL HISTORY:  The patient was homeless, living in [place] and shelters and trying to find social security disability income and struggling to live in [place].  MENTAL STATUS EXAMINATION:  He appeared as per his stated age.  He is tall, slender, lying down in hospital bed with sheets covered up to his waist.  There is a staff next to him for observation.  He stated mood was fine.  His affect was appropriate.  He has a normal rate, rhythm, and volume of speech.  His thought process is linear and goal directed. He has denied suicidal or homicidal ideation, intention,  or plan.  He has no evidence of paranoia, visual hallucinations.  He endorses auditory hallucinations, which were chronic in nature.  He has a fair insight, judgment, and impulse control.  DIAGNOSES:  AXIS I:  Schizoaffective disorder by history, questionable overdose on his medications. AXIS II:  Deferred. AXIS III:  None. AXIS IV:  Problems with moderate-to-severe psychosocial stressors, homeless, unemployment, financial problems, place to live, and being living on and off in different places for the last 2-3 months in [place].  He has been in contact with his mother who is a little helpful to him financially. Axis V:  GAF was 40-50.  TREATMENT PLAN:  I reviewed the case with the patient and Dr.  Irene Limbo. Hospitalist recommended continue his medication management Depakote 1000 mg at bedtime, Geodon 160 mg at bedtime, trazodone 300-450 mg at bedtime, but in which needed to be verified from the pharmacy.  The patient does not meet criteria for acute psychiatric hospitalizations due to repeated recurrent denial of the suicidal or homicidal ideations. No danger to himself or others.  The patient referred to the case manager/social worker for the appropriate psychosocial support.  The patient will be required outpatient psychiatric services upon discharge medically.     Conni Slipper, MD     JRJ/MEDQ  D:  08/13/2011  T:  08/14/2011  Job:  [id number]  Electronically Signed by Leata Mouse MD on 08/24/2011 01:48:41 PM

## 2011-08-24 NOTE — H&P (Signed)
NAMETREVELL, PARISEAU NO.:  [id number]  MEDICAL RECORD NO.:  [id number]  LOCATION:  WLED                         FACILITY:  [facility]  PHYSICIAN:  Brendia Sacks, MD    DATE OF BIRTH:  12-29-1985  DATE OF ADMISSION:  08/13/2011 DATE OF DISCHARGE:                             HISTORY & PHYSICAL   REFERRING PHYSICIAN:  Tinnie Gens P. Caporossi, MD  PRIMARY CARE PHYSICIAN:  [facility], Dr. Allena Katz.  CHIEF COMPLAINT:  "No clue."  HISTORY OF PRESENT ILLNESS:  This is a 25 year old man who presents with a history of overdose.  Per the review of ER physician's documentation and EMS, the patient was found in his hotel room this morning with pills missing from his trazodone and Klonopin bottle with witnesses having watched him take a significant amount of Klonopin.  Per these records, he was taking half a tablet of his Klonopin every 30 minutes and pills were noted to be missing from at least his Klonopin bottle.  EMS was called and the patient was obtunded on their arrival.  They had difficulty awakening him.  He was taken to the emergency room and Poison Control was contacted.  Supportive care was main treatment for him.  I reviewed the ER documentation which has some conflicting information. It was noted that the patient was unresponsive in the HPI and obtunded on arrival, but also that the history was provided by the patient.  Per ER physical examination, the patient is noted to be in no acute distress.  Respiratory status was stable and he is noted to be alert and oriented x4 with no abnormalities in mood or affect.  Again, he was noted to be obtunded in the ER, arising to sternal rub.  It is noted that about 20 Klonopin missing from his bottle as well as 10 trazodone missing from his bottle.  The patient himself has no idea why he is here.  He thinks he has been staying with someone and he believes that he took all of his medications and went to bed and his friend  tried to awaken him and when he had difficulty he called EMS.  The patient notes that he has extreme difficulty waking when he takes his prescribed medications for his psychiatric disorders.  He absolutely denies taking extra Klonopin or overdosed in anyway.  He denies suicidal or homicidal ideation.  He would not be surprised if the people who were in the hotel room with him took some of this medication.  The patient was placed in involuntary commitment and referred for admission by the emergency room physician.  REVIEW OF SYSTEMS:  Negative for fever, changes to his vision, sore throat, rash, muscle aches, chest pain, shortness of breath, nausea, vomiting, abdominal pain, diarrhea, dysuria, or bleeding.  PAST MEDICAL HISTORY: 1. Diabetes mellitus. 2. Depression. 3. Schizo-affective disorder.  PAST SURGICAL HISTORY:  None.  SOCIAL HISTORY:  One half pack per day of tobacco.  No alcohol.  No drugs.  ALLERGIES:  SULFA, he has no idea what his reaction is.  FAMILY HISTORY:  Negative for first-degree coronary artery disease.  MAINTENANCE MEDICATIONS: 1. Klonopin 0.5 mg p.o. t.i.d. as needed for anxiety. 2. Depakote ER 500  mg 2 capsules p.o. q.h.s. 3. Humalog mix 50/50 sliding scale t.i.d. 4. Ibuprofen 200 mg p.o. every 8 hours as needed for pain. 5. Trazodone 100 mg p.o. 3 tablets nightly. 6. Lantus 25 units subcutaneous nightly. 7. Geodon 80 mg 2 tablets p.o. nightly. 8. Clonidine 0.1 mg p.o. nightly. 9. Adderall 10 mg p.o. daily.  PHYSICAL EXAMINATION:  VITAL SIGNS:  Blood pressure 111/60, pulse 113, respirations 16, temperature 98.2, saturation 98%. GENERAL:  The patient was examined in the ER, currently lying on a stretcher.  He is in no acute distress.  He is alert and awake and answers questions appropriately.  He was able to provide his own review of systems and further history. HEENT:  Head appears to be normal.  Eyes, sclerae clear.  Pupils are equal, round,  reactive to light with irides and conjunctivae appear unremarkable.  ENT, hearing is grossly normal.  Lips and tongue appear unremarkable. NECK:  Supple.  No lymphadenopathy or masses.  No thyromegaly. CHEST:  Clear to auscultation bilaterally.  No wheezes, rales, or rhonchi.  There is normal respiratory effort. ABDOMEN:  Soft, nontender, and nondistended.  Nontender to palpation. SKIN:  Normal without rash or indurations.  Nontender to palpation. EXTREMITIES:  Tone in the upper and lower extremities appears to be grossly normal. PSYCHIATRIC:  Grossly normal mood and affect.  Speech is fluent and appropriate.  ANCILLARY STUDIES:  EKG independently reviewed shows a mild sinus tachycardia, no acute changes.  IMAGING:  None.  PERTINENT LABORATORY STUDIES: 1. CBC is unremarkable. 2. Basic metabolic panel was notable for potassium of 3.3 and glucose     of 394. 3. Hepatic function panel was unremarkable. 4. Acetaminophen level and salicylate levels were negative. 5. Urine drug screen was negative. 6. Alcohol level was negative. 7. Urinalysis was essentially unremarkable.  ASSESSMENT AND PLAN:  This is a 25 year old man who presents with a reported history of overdose. 1. Reported overdose of Klonopin and trazodone.  Main treatment is     supportive care.  Monitor for respiratory depression and cardiac     arrhythmias.  The patient will be admitted to the step-down unit.     The patient adamantly denies any suicidal or homicidal ideation or     overdose; however, there is conflicting information here.  He has     already been placed on involuntary commitment by Dr. Weldon Inches.  We     will ask Psychiatry to see in consultation to provide further     evaluation and recommendations.  We will hold his sedating     medications at this point and follow him. 2. Diabetes mellitus type 2, probably uncontrolled.  We will place him     back on his Lantus and place him on sliding scale insulin  here. 3. Hypokalemia.  We will replete. 4. Depression, schizo-affective disorder.  We will continue him on his     Depakote.  We will ask Psychiatry for further evaluation and     recommendations.     Brendia Sacks, MD     DG/MEDQ  D:  08/13/2011  T:  08/13/2011  Job:  [id number]  Electronically Signed by Brendia Sacks  on 08/24/2011 10:00:04 PM

## 2011-08-28 NOTE — Discharge Summary (Signed)
  NAMEVALERIA, [name]:  [id number]  MEDICAL RECORD NO.:  [id number]  LOCATION:  3302                         FACILITY:  [facility]  PHYSICIAN:  [name], M.D.DATE OF BIRTH:  11-Aug-1986  DATE OF ADMISSION:  08/19/2011 DATE OF DISCHARGE:  08/20/2011                              DISCHARGE SUMMARY   PRIMARY CARE PROVIDER:  Dr. Allena [name], of [facility].  DISCHARGE DIAGNOSES: 1. Altered mental status felt to be secondary to medication ingestion     although it is not clear what the patient was taking, this ought to     be a complete accidental overdose. 2. Controlled diabetes mellitus type 1. 3. Tobacco abuse. 4. History of schizoaffective disorder. 5. History of depression.  DISCHARGE MEDICATIONS:  The patient is leaving against medical advice. He will likely resume on his previous medications which are listed in his previous discharge summary: 1. Adderall 10 mg p.o. daily. 2. Depakote ER 1000 p.o. at bedtime. 3. Geodon 160 p.o. at bedtime. 4. Trazodone 300 at bedtime. 5. Insulin. 6. Clonidine 0.1 p.o. at bedtime. 7. Clonazepam 0.5 p.o. p.r.n. t.i.d. for anxiety. 8. Motrin 200 p.o. p.r.n. every 8 hours for pain.  HOSPITAL COURSE:  The patient is a 25 year old white male with past medical history as above who was discharged from the hospitalist service 5 days ago for somnolence secondary to prescribed psychiatric medications.  He presented back again with passed out and rambling, incoherent, to be hypersomnolent.  He was admitted and watched overnight.  By the morning of August 20, 2011, he was fully wide awake.  He was amenable to getting subcu sliding-scale insulin but would not allow anything else to be done.  He asked to immediately leave and was not willing to wait 30 minutes for myself, the hospitalist to come and evaluate him for formally discharging the patient.  I discussed this case with his nurse on call.  The patient's vital  signs are stable.  His only abnormal labs were elevated CBG levels for which he is going to receive sliding scale.  He admits to have been inconsistently taking his insulin medications.  His other labs were unremarkable and he left against medical advice.     [name], M.D.     SKK/MEDQ  D:  08/20/2011  T:  08/20/2011  Job:  [id number]  cc:   [facility]  Electronically Signed by [name] M.D. on 08/28/2011 04:40:41 PM

## 2011-09-16 ENCOUNTER — Emergency Department (HOSPITAL_COMMUNITY)
Admission: EM | Admit: 2011-09-16 | Discharge: 2011-09-16 | Disposition: A | Discharge status: Home / Self Care | Payer: Self-pay | Attending: Emergency Medicine | Admitting: Emergency Medicine

## 2011-09-16 DIAGNOSIS — R109 Unspecified abdominal pain: Secondary | ICD-10-CM | POA: Insufficient documentation

## 2011-09-16 DIAGNOSIS — F3289 Other specified depressive episodes: Secondary | ICD-10-CM | POA: Insufficient documentation

## 2011-09-16 DIAGNOSIS — R112 Nausea with vomiting, unspecified: Secondary | ICD-10-CM | POA: Insufficient documentation

## 2011-09-16 DIAGNOSIS — F329 Major depressive disorder, single episode, unspecified: Secondary | ICD-10-CM | POA: Insufficient documentation

## 2011-09-16 DIAGNOSIS — R3589 Other polyuria: Secondary | ICD-10-CM | POA: Insufficient documentation

## 2011-09-16 DIAGNOSIS — R358 Other polyuria: Secondary | ICD-10-CM | POA: Insufficient documentation

## 2011-09-16 DIAGNOSIS — Z794 Long term (current) use of insulin: Secondary | ICD-10-CM | POA: Insufficient documentation

## 2011-09-16 DIAGNOSIS — E119 Type 2 diabetes mellitus without complications: Secondary | ICD-10-CM | POA: Insufficient documentation

## 2011-09-16 DIAGNOSIS — Z79899 Other long term (current) drug therapy: Secondary | ICD-10-CM | POA: Insufficient documentation

## 2011-09-16 LAB — GLUCOSE, CAPILLARY
Glucose-Capillary: 372 mg/dL — ABNORMAL HIGH (ref 70–99)
Glucose-Capillary: 235 mg/dL — ABNORMAL HIGH (ref 70–99)
Glucose-Capillary: 398 mg/dL — ABNORMAL HIGH (ref 70–99)
Glucose-Capillary: 282 mg/dL — ABNORMAL HIGH (ref 70–99)

## 2011-09-16 LAB — COMPREHENSIVE METABOLIC PANEL
ALT: 17 U/L (ref 0–53)
AST: 11 U/L (ref 0–37)
Albumin: 4.5 g/dL (ref 3.5–5.2)
Alkaline Phosphatase: 92 U/L (ref 39–117)
BUN: 14 mg/dL (ref 6–23)
CO2: 32 mEq/L (ref 19–32)
Calcium: 10.4 mg/dL (ref 8.4–10.5)
Chloride: 90 mEq/L — ABNORMAL LOW (ref 96–112)
Creatinine, Ser: 0.6 mg/dL (ref 0.50–1.35)
GFR calc Af Amer: >60 mL/min (ref >60)
GFR calc non Af Amer: >60 mL/min (ref >60)
Glucose, Bld: 573 mg/dL (ref 70–99)
Potassium: 4 mEq/L (ref 3.5–5.1)
Sodium: 132 mEq/L — ABNORMAL LOW (ref 135–145)
Total Bilirubin: 0.5 mg/dL (ref 0.3–1.2)
Total Protein: 7.5 g/dL (ref 6.0–8.3)

## 2011-09-16 LAB — URINALYSIS, ROUTINE W REFLEX MICROSCOPIC
Glucose, UA: >1000 mg/dL — AB
Leukocytes, UA: NEGATIVE
Nitrite: NEGATIVE
pH: 7.5 (ref 5.0–8.0)

## 2011-09-16 LAB — CBC
HCT: 45.4 % (ref 39.0–52.0)
MCH: 30.7 pg (ref 26.0–34.0)
MCV: 83.5 fL (ref 78.0–100.0)
Platelets: 306 10*3/uL (ref 150–400)
RBC: 5.44 MIL/uL (ref 4.22–5.81)

## 2011-09-16 LAB — DIFFERENTIAL
Basophils Absolute: 0.1 10*3/uL (ref 0.0–0.1)
Basophils Relative: 1 % (ref 0–1)
Eosinophils Absolute: 0.1 10*3/uL (ref 0.0–0.7)
Eosinophils Relative: 1 % (ref 0–5)
Lymphocytes Relative: 21 % (ref 12–46)
Lymphs Abs: 2.1 10*3/uL (ref 0.7–4.0)
Monocytes Absolute: 0.7 10*3/uL (ref 0.1–1.0)
Monocytes Relative: 7 % (ref 3–12)
Neutro Abs: 6.7 10*3/uL (ref 1.7–7.7)
Neutrophils Relative %: 70 % (ref 43–77)

## 2011-09-16 LAB — POCT I-STAT 3, VENOUS BLOOD GAS (G3P V)
Acid-Base Excess: 4 mmol/L — ABNORMAL HIGH (ref 0.0–2.0)
Bicarbonate: 31.3 mEq/L — ABNORMAL HIGH (ref 20.0–24.0)
O2 Saturation: 49 %
TCO2: 33 mmol/L (ref 0–100)

## 2011-09-16 LAB — URINE MICROSCOPIC-ADD ON

## 2011-11-18 HISTORY — PX: INCISION AND DRAINAGE ABSCESS: SHX5864

## 2011-12-12 ENCOUNTER — Emergency Department (HOSPITAL_COMMUNITY)
Admission: EM | Admit: 2011-12-12 | Discharge: 2011-12-13 | Disposition: A | Discharge status: Home / Self Care | Payer: Self-pay | Attending: Emergency Medicine | Admitting: Emergency Medicine

## 2011-12-12 ENCOUNTER — Encounter: Payer: Self-pay | Admitting: Emergency Medicine

## 2011-12-12 DIAGNOSIS — L02818 Cutaneous abscess of other sites: Secondary | ICD-10-CM | POA: Insufficient documentation

## 2011-12-12 DIAGNOSIS — L02811 Cutaneous abscess of head [any part, except face]: Secondary | ICD-10-CM

## 2011-12-12 DIAGNOSIS — S0100XA Unspecified open wound of scalp, initial encounter: Secondary | ICD-10-CM | POA: Insufficient documentation

## 2011-12-12 DIAGNOSIS — X58XXXA Exposure to other specified factors, initial encounter: Secondary | ICD-10-CM | POA: Insufficient documentation

## 2011-12-12 NOTE — ED Notes (Signed)
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 No bleeding noted, hard to touch, redness in the area, above the skin level.

## 2011-12-13 ENCOUNTER — Emergency Department (HOSPITAL_COMMUNITY): Payer: Self-pay

## 2011-12-13 MED ORDER — DOXYCYCLINE HYCLATE 100 MG PO CAPS: 100.0000 mg | ORAL_CAPSULE | Freq: Two times a day (BID) | ORAL | Status: DC

## 2011-12-13 MED ORDER — TRAMADOL HCL 50 MG PO TABS: 50.0000 mg | ORAL_TABLET | Freq: Four times a day (QID) | ORAL | Status: DC | PRN

## 2011-12-13 MED ORDER — TRAMADOL HCL 50 MG PO TABS
50.0000 mg | ORAL_TABLET | Freq: Once | ORAL | Status: AC
  Administered 2011-12-13: 50 mg via ORAL
  Filled 2011-12-13: qty 1

## 2011-12-13 MED ORDER — LIDOCAINE HCL 2 % IJ SOLN
INTRAMUSCULAR | Status: AC
  Administered 2011-12-13: 01:00:00
  Filled 2011-12-13: qty 1

## 2011-12-13 NOTE — ED Provider Notes (Signed)
History     CSN: [id number]  Arrival date & time 12/12/11  2207   First MD Initiated Contact with Patient 12/13/11 0006      Chief Complaint  Patient presents with  . Abscess  . Cyst    (Consider location/radiation/quality/duration/timing/severity/associated sxs/prior treatment) Patient is a 25 y.o. male presenting with abscess. The history is provided by the patient.  Abscess  This is a new problem. Episode onset: 5 days ago. The problem has been unchanged. The abscess is present on the scalp. The problem is moderate. The abscess is characterized by redness, painfulness and swelling. The abscess first occurred at home.  Pt states his brother tried to squeeze it but nothing came out. States it is not improving. Denies fever, chills, malaise. 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 No pertinent past medical history.  History reviewed. No pertinent past surgical history.  History reviewed. No pertinent family history.  History  Substance Use Topics  . Smoking status: Current Everyday Smoker  . Smokeless tobacco: Not on file  . Alcohol Use: No      Review of Systems  Constitutional: Negative.   HENT: Negative.   Eyes: Negative.   Cardiovascular: Negative.   Gastrointestinal: Negative.   Genitourinary: Negative.   Musculoskeletal: Negative.   Skin: Positive for wound.  Neurological: Negative.   Psychiatric/Behavioral: Negative.     Allergies  Sulfa antibiotics  Home Medications  No current outpatient prescriptions on file.  BP 131/89  Pulse 103  Resp 20  SpO2 100%  Physical Exam  Nursing note and vitals reviewed. Constitutional: He is oriented to person, place, and time. He appears well-developed and well-nourished.  HENT:  Head: Normocephalic.  Eyes: Conjunctivae are normal.  Neck: Normal range of motion. Neck supple.  Cardiovascular: Normal rate, regular rhythm and normal heart  sounds.   Pulmonary/Chest: Effort normal and breath sounds normal. No respiratory distress.  Neurological: He is alert and oriented to person, place, and time.  Skin: Skin is warm and dry.       3cm indurated area to the posterior midline scalp, with central scabbing. Tender to palpation. NO surrounding erythema.   Psychiatric: He has a normal mood and affect.    ED Course  Procedures (including critical care time)    INCISION AND DRAINAGE Performed by: Jaynie Crumble A Consent: Verbal consent obtained. Risks and benefits: risks, benefits and alternatives were discussed Type: abscess  Body area: scalp  Anesthesia: local infiltration  Local anesthetic: lidocaine 2% w/o epinephrine  Anesthetic total: 2 ml  Complexity: complex Blunt dissection to break up loculations  Drainage: purulent  Drainage amount: minimal  Packing material: 1/4 in iodoform gauze  Patient tolerance: Patient tolerated the procedure well with no immediate complications.   Dg Skull 1-3 Views  12/13/2011  *RADIOLOGY REPORT*  Clinical Data: Laceration to the occiput; the patient works with metal.  Assess for metal embedded within the soft tissues.  SKULL - 1-3 VIEW  Comparison: CT of the head performed 08/19/2011  Findings: No radiopaque foreign bodies are identified.  The calvarium is grossly unremarkable in appearance.  There is no evidence of fracture.  The visualized paranasal sinuses and mastoid air cells are well-aerated.  Diffuse dental fillings are noted.  No significant soft tissue abnormalities are characterized on radiograph.  IMPRESSION: No radiopaque foreign bodies seen.  Original Report Authenticated By: Tonia Ghent, M.D.   No foreign body. Will d/c home  MDM  Lottie Mussel, [place] 12/13/11 [phone]

## 2011-12-14 ENCOUNTER — Emergency Department (INDEPENDENT_AMBULATORY_CARE_PROVIDER_SITE_OTHER)
Admission: EM | Admit: 2011-12-14 | Discharge: 2011-12-14 | Disposition: A | Discharge status: Home / Self Care | Payer: Self-pay | Source: Home / Self Care | Attending: Emergency Medicine | Admitting: Emergency Medicine

## 2011-12-14 ENCOUNTER — Encounter (HOSPITAL_COMMUNITY): Payer: Self-pay

## 2011-12-14 DIAGNOSIS — L089 Local infection of the skin and subcutaneous tissue, unspecified: Secondary | ICD-10-CM

## 2011-12-14 MED ORDER — IBUPROFEN 800 MG PO TABS: 800.0000 mg | ORAL_TABLET | Freq: Three times a day (TID) | ORAL | Status: DC

## 2011-12-14 NOTE — ED Provider Notes (Signed)
History     CSN: [id number]  Arrival date & time 12/14/11  1726   First MD Initiated Contact with Patient 12/14/11 1743      Chief Complaint  Patient presents with  . Wound Check    (Consider location/radiation/quality/duration/timing/severity/associated sxs/prior treatment) Patient is a 25 y.o. male presenting with wound check. The history is provided by the patient.  Wound Check  He was treated in the ED 5 to 10 days ago. Previous treatment in the ED includes I&D of abscess. Treatments since wound repair include oral antibiotics. There has been clear discharge from the wound. There is no swelling present. The pain has no pain.    Past Medical History  Diagnosis Date  . Diabetes mellitus     History reviewed. No pertinent past surgical history.  No family history on file.  History  Substance Use Topics  . Smoking status: Current Everyday Smoker -- 0.5 packs/day  . Smokeless tobacco: Not on file  . Alcohol Use: No      Review of Systems  Allergies  Sulfa antibiotics  Home Medications   Current Outpatient Rx  Name Route Sig Dispense Refill  . DOXYCYCLINE HYCLATE 100 MG PO CAPS Oral Take 1 capsule (100 mg total) by mouth 2 (two) times daily. 14 capsule 0  . INSULIN GLARGINE 100 UNIT/ML [place] SOLN Subcutaneous Inject 25 Units into the skin at bedtime.      . INSULIN LISPRO (HUMAN) 100 UNIT/ML [place] SOLN Subcutaneous Inject 10 Units into the skin 3 (three) times daily before meals.      . TRAMADOL HCL 50 MG PO TABS Oral Take 1 tablet (50 mg total) by mouth every 6 (six) hours as needed for pain. Maximum dose= 8 tablets per day 15 tablet 0  . IBUPROFEN 800 MG PO TABS Oral Take 1 tablet (800 mg total) by mouth 3 (three) times daily. 21 tablet 0    BP 124/79  Pulse 114  Temp(Src) 99 F (37.2 C) (Oral)  Resp 16  SpO2 100%  Physical Exam  Nursing note and vitals reviewed. Constitutional: He appears well-developed and well-nourished.  Skin: There is erythema.        ED Course  Procedures (including critical care time)  Labs Reviewed - No data to display Dg Skull 1-3 Views  12/13/2011  *RADIOLOGY REPORT*  Clinical Data: Laceration to the occiput; the patient works with metal.  Assess for metal embedded within the soft tissues.  SKULL - 1-3 VIEW  Comparison: CT of the head performed 08/19/2011  Findings: No radiopaque foreign bodies are identified.  The calvarium is grossly unremarkable in appearance.  There is no evidence of fracture.  The visualized paranasal sinuses and mastoid air cells are well-aerated.  Diffuse dental fillings are noted.  No significant soft tissue abnormalities are characterized on radiograph.  IMPRESSION: No radiopaque foreign bodies seen.  Original Report Authenticated By: Tonia Ghent, M.D.     1. Superficial injury of scalp with infection       MDM  Scalp- injury        Jimmie Molly, MD 12/14/11 1932

## 2011-12-14 NOTE — ED Notes (Signed)
Seen in ED 2 days ago for abscess to posterior scalp.  Here for recheck as directed.  States compliant with antibiotics and pain med.  Reports area is more painful today.  Has not changed dressing.

## 2011-12-14 NOTE — ED Provider Notes (Signed)
Medical screening examination/treatment/procedure(s) were performed by non-physician practitioner and as supervising physician I was immediately available for consultation/collaboration.   Kaiyden Simkin, MD 12/14/11 0750 

## 2011-12-18 ENCOUNTER — Emergency Department (HOSPITAL_COMMUNITY): Payer: Worker's Compensation

## 2011-12-18 ENCOUNTER — Inpatient Hospital Stay (HOSPITAL_COMMUNITY)
Admission: EM | Admit: 2011-12-18 | Discharge: 2011-12-22 | DRG: 581 | Disposition: A | Discharge status: Home / Self Care | Payer: Worker's Compensation | Source: Ambulatory Visit | Attending: Orthopedic Surgery | Admitting: Orthopedic Surgery

## 2011-12-18 ENCOUNTER — Encounter (HOSPITAL_COMMUNITY): Payer: Self-pay

## 2011-12-18 DIAGNOSIS — E119 Type 2 diabetes mellitus without complications: Secondary | ICD-10-CM | POA: Diagnosis present

## 2011-12-18 DIAGNOSIS — Z794 Long term (current) use of insulin: Secondary | ICD-10-CM

## 2011-12-18 DIAGNOSIS — L02519 Cutaneous abscess of unspecified hand: Principal | ICD-10-CM | POA: Diagnosis present

## 2011-12-18 DIAGNOSIS — F329 Major depressive disorder, single episode, unspecified: Secondary | ICD-10-CM | POA: Diagnosis present

## 2011-12-18 DIAGNOSIS — F3289 Other specified depressive episodes: Secondary | ICD-10-CM | POA: Diagnosis present

## 2011-12-18 DIAGNOSIS — F17200 Nicotine dependence, unspecified, uncomplicated: Secondary | ICD-10-CM | POA: Diagnosis present

## 2011-12-18 DIAGNOSIS — L03114 Cellulitis of left upper limb: Secondary | ICD-10-CM

## 2011-12-18 DIAGNOSIS — X58XXXA Exposure to other specified factors, initial encounter: Secondary | ICD-10-CM | POA: Diagnosis present

## 2011-12-18 DIAGNOSIS — S61209A Unspecified open wound of unspecified finger without damage to nail, initial encounter: Secondary | ICD-10-CM | POA: Diagnosis present

## 2011-12-18 DIAGNOSIS — Y990 Civilian activity done for income or pay: Secondary | ICD-10-CM

## 2011-12-18 HISTORY — DX: Major depressive disorder, single episode, unspecified: F32.9

## 2011-12-18 HISTORY — DX: Depression, unspecified: F32.A

## 2011-12-18 LAB — CBC
Hemoglobin: 13.9 g/dL (ref 13.0–17.0)
MCH: 30.5 pg (ref 26.0–34.0)
MCHC: 36.7 g/dL — ABNORMAL HIGH (ref 30.0–36.0)
Platelets: 339 10*3/uL (ref 150–400)
RBC: 4.55 MIL/uL (ref 4.22–5.81)

## 2011-12-18 LAB — DIFFERENTIAL
Basophils Absolute: 0.1 10*3/uL (ref 0.0–0.1)
Basophils Relative: 1 % (ref 0–1)
Eosinophils Absolute: 0.4 10*3/uL (ref 0.0–0.7)
Monocytes Relative: 9 % (ref 3–12)
Neutro Abs: 9.1 10*3/uL — ABNORMAL HIGH (ref 1.7–7.7)
Neutrophils Relative %: 71 % (ref 43–77)

## 2011-12-18 LAB — BASIC METABOLIC PANEL
BUN: 8 mg/dL (ref 6–23)
Chloride: 95 mEq/L — ABNORMAL LOW (ref 96–112)
GFR calc Af Amer: >90 mL/min (ref >90)
GFR calc non Af Amer: >90 mL/min (ref >90)
Potassium: 3.5 mEq/L (ref 3.5–5.1)
Sodium: 134 mEq/L — ABNORMAL LOW (ref 135–145)

## 2011-12-18 MED ORDER — HYDROMORPHONE HCL PF 1 MG/ML IJ SOLN
1.0000 mg | Freq: Once | INTRAMUSCULAR | Status: AC
  Administered 2011-12-18: 1 mg via INTRAVENOUS
  Filled 2011-12-18: qty 1

## 2011-12-18 MED ORDER — INSULIN ASPART 100 UNIT/ML ~~LOC~~ SOLN
0.0000 [IU] | SUBCUTANEOUS | Status: DC
  Administered 2011-12-19: 2 [IU] via SUBCUTANEOUS
  Administered 2011-12-19: 5 [IU] via SUBCUTANEOUS
  Administered 2011-12-19: 11 [IU] via SUBCUTANEOUS
  Administered 2011-12-19 (×2): 8 [IU] via SUBCUTANEOUS
  Administered 2011-12-19: 2 [IU] via SUBCUTANEOUS
  Administered 2011-12-20: 8 [IU] via SUBCUTANEOUS
  Administered 2011-12-20 (×2): 5 [IU] via SUBCUTANEOUS
  Administered 2011-12-20 – 2011-12-21 (×2): 3 [IU] via SUBCUTANEOUS
  Administered 2011-12-21: 2 [IU] via SUBCUTANEOUS
  Administered 2011-12-21 (×2): 8 [IU] via SUBCUTANEOUS
  Administered 2011-12-21: 2 [IU] via SUBCUTANEOUS
  Administered 2011-12-22: 8 [IU] via SUBCUTANEOUS
  Administered 2011-12-22: 2 [IU] via SUBCUTANEOUS
  Administered 2011-12-22: 5 [IU] via SUBCUTANEOUS
  Administered 2011-12-22: 15 [IU] via SUBCUTANEOUS
  Filled 2011-12-18 (×3): qty 3

## 2011-12-18 MED ORDER — SODIUM CHLORIDE 0.9 % IV BOLUS (SEPSIS)
1000.0000 mL | Freq: Once | INTRAVENOUS | Status: AC
  Administered 2011-12-18: 1000 mL via INTRAVENOUS

## 2011-12-18 MED ORDER — INSULIN GLARGINE 100 UNIT/ML ~~LOC~~ SOLN
25.0000 [IU] | Freq: Every day | SUBCUTANEOUS | Status: DC
  Administered 2011-12-19 – 2011-12-21 (×4): 25 [IU] via SUBCUTANEOUS
  Filled 2011-12-18: qty 3

## 2011-12-18 MED ORDER — SODIUM CHLORIDE 0.9 % IV SOLN
Freq: Once | INTRAVENOUS | Status: AC
  Administered 2011-12-18: 22:00:00 via INTRAVENOUS

## 2011-12-18 NOTE — ED Provider Notes (Deleted)
Patient presents to ED for evaluation of infection to left thumb.  Patient reports he received a laceration from an unknown sharp object while at work.  Large erythematous area surrounding MP joint of left thumb, with spreading cellulitis to dorsum of hand.  Patient is a diabetic.  Patient refusing lab work, as he states he does not think the lab work is important in the treatment plan.  Mild fruit odor noted when standing next to patient.  After speaking with Dr. Ethelda Chick, patient agreeable to diagnostic and treatment plan.  Jimmye Norman, NP 12/18/11 [phone]

## 2011-12-18 NOTE — ED Provider Notes (Signed)
History     CSN: [id number]  Arrival date & time 12/18/11  1703   First MD Initiated Contact with Patient 12/18/11 2010      Chief Complaint  Patient presents with  . Wound Infection    (Consider location/radiation/quality/duration/timing/severity/associated sxs/prior treatment) Patient is a 25 y.o. male presenting with hand injury.  Hand Injury  The incident occurred more than 2 days ago. The incident occurred at work. The injury mechanism was an incision. The pain is present in the left hand. The quality of the pain is described as throbbing. The pain is severe. The pain has been fluctuating since the incident. He reports no foreign bodies present. The symptoms are aggravated by movement, use and palpation. The treatment provided no relief.    Past Medical History  Diagnosis Date  . Diabetes mellitus     History reviewed. No pertinent past surgical history.  No family history on file.  History  Substance Use Topics  . Smoking status: Current Everyday Smoker -- 0.5 packs/day  . Smokeless tobacco: Not on file  . Alcohol Use: No      Review of Systems  Musculoskeletal: Positive for joint swelling and arthralgias.  All other systems reviewed and are negative.    Allergies  Sulfa antibiotics  Home Medications   Current Outpatient Rx  Name Route Sig Dispense Refill  . DOXYCYCLINE HYCLATE 100 MG PO CAPS Oral Take 100 mg by mouth 2 (two) times daily.      . IBUPROFEN 800 MG PO TABS Oral Take 800 mg by mouth 3 (three) times daily.      . INSULIN GLARGINE 100 UNIT/ML [place] SOLN Subcutaneous Inject 25 Units into the skin at bedtime.      . INSULIN LISPRO (HUMAN) 100 UNIT/ML [place] SOLN Subcutaneous Inject 10 Units into the skin 3 (three) times daily before meals.      . TRAMADOL HCL 50 MG PO TABS Oral Take 50 mg by mouth every 6 (six) hours as needed. For pain. Maximum dose= 8 tablets per day       BP 129/77  Pulse 108  Temp(Src) 98.3 F (36.8 C) (Oral)  Resp 16  Ht 6'  2" (1.88 m)  Wt 135 lb (61.236 kg)  BMI 17.33 kg/m2  SpO2 97%  Physical Exam  Constitutional: He is oriented to person, place, and time. He appears well-developed and well-nourished.  HENT:  Head: Normocephalic and atraumatic.  Eyes: Conjunctivae are normal. Pupils are equal, round, and reactive to light.  Neck: Normal range of motion. Neck supple.  Cardiovascular: Normal rate, regular rhythm, normal heart sounds and intact distal pulses.   Pulmonary/Chest: Effort normal and breath sounds normal.  Abdominal: Soft. Bowel sounds are normal.  Musculoskeletal: He exhibits edema and tenderness.  Neurological: He is alert and oriented to person, place, and time.  Skin: Skin is warm and dry.  Psychiatric: He has a normal mood and affect.    ED Course  Procedures (including critical care time)  Labs Reviewed  CBC - Abnormal; Notable for the following:    WBC 12.7 (*)    HCT 37.9 (*)    MCHC 36.7 (*)    All other components within normal limits  DIFFERENTIAL - Abnormal; Notable for the following:    Neutro Abs 9.1 (*)    Monocytes Absolute 1.2 (*)    All other components within normal limits  BASIC METABOLIC PANEL - Abnormal; Notable for the following:    Sodium 134 (*)  Chloride 95 (*)    Glucose, Bld 387 (*)    Creatinine, Ser 0.44 (*)    All other components within normal limits   Dg Hand Complete Left  12/18/2011  *RADIOLOGY REPORT*  Clinical Data: Left thumb wound.  Prior laceration.  Erythema. Diabetes.  LEFT HAND - COMPLETE 3+ VIEW  Comparison: None.  Findings: No fracture, foreign body, or acute bony findings are identified.  No findings of osteomyelitis or gas in the soft tissues.  IMPRESSION: 1.  No fracture, foreign body, findings of osteomyelitis, or gas in the soft tissues.  Original Report Authenticated By: Dellia Cloud, M.D.     No diagnosis found.  Patient presents to ED for evaluation of infection to left thumb. Patient reports he received a  laceration from an unknown sharp object while at work. Large erythematous area surrounding MCP joint of left thumb, with spreading cellulitis to dorsum of hand. Patient is a diabetic. Patient refusing lab work, as he states he does not think the lab work is important in the treatment plan. Mild fruit odor noted when standing next to patient. After speaking with Dr. Ethelda Chick, patient agreeable to diagnostic and treatment plan.  Jimmye Norman, NP  12/18/11 2144  10:19 PM   Dr. Orlan Leavens contacted to consult--currently in surgery, will see shortly.  11:43 PM  Dr.Ortman has seen patient, will admit for surgery in AM.   MDM          Jimmye Norman, NP 12/18/11 [phone]

## 2011-12-18 NOTE — ED Notes (Signed)
MD Orttman at bedside 

## 2011-12-18 NOTE — ED Provider Notes (Signed)
Patient injured his left palm several weeks ago presents today with redness pain and swelling in left hand radiating proximally to the distal forearm. On exam alert nontoxic left upper extremity with approximately dime size lesion over from MCP joint or so aspect hand is diffusely reddened, with a red streak distally and proximally the distal one fourth of the volar forearm patient has pain active extension of the thumb. Concern for cellulitis and possible extensor tenosynovitis  Doug Sou, MD 12/18/11 2043

## 2011-12-18 NOTE — ED Notes (Signed)
Pt states he was at work and on 12/07 and scraped his thumb on the left hand on a piece of metal.  Pt did not seek treatment at that time because it was a scrape and not a significant injury.  Subsequently on 12/20 pt noticed something on his head and sought treatment on 12/25 at [facility].  I & D performed in the ED and then pt followed up at [facility] on 12/27.  Pt presents to [facility] today for infection of the thumb injury sustained on 12/07.  Pt reports increased redness, swelling, and pain to the left hand.  Redness appears to be extending up beyond the wrist at this time.

## 2011-12-18 NOTE — H&P (Signed)
[name] is an 25 y.o. male.   Chief Complaint: Worsening left thumb infection HPI: Pt with several day history of worsening thumb infection. Pt presented to ed with redness and swelling in thumb has been on oral abx. Pt concerned about worsening infection.s  Past Medical History  Diagnosis Date  . Diabetes mellitus     History reviewed. No pertinent past surgical history.  No family history on file. Social History:  reports that he has been smoking.  He does not have any smokeless tobacco history on file. He reports that he does not drink alcohol or use illicit drugs.  Allergies:  Allergies  Allergen Reactions  . Sulfa Antibiotics Other (See Comments)    unknown    Medications Prior to Admission  Medication Dose Route Frequency Provider Last Rate Last Dose  . 0.9 %  sodium chloride infusion   Intravenous Once Jimmye Norman, NP 999 mL/hr at 12/18/11 2146    . HYDROmorphone (DILAUDID) injection 1 mg  1 mg Intravenous Once Jimmye Norman, NP   1 mg at 12/18/11 2144  . insulin aspart (novoLOG) injection 0-15 Units  0-15 Units Subcutaneous Q4H Cherith Tewell W Burnetta Kohls      . insulin glargine (LANTUS) injection 25 Units  25 Units Subcutaneous QHS Bladimir Auman W Charrie Mcconnon      . sodium chloride 0.9 % bolus 1,000 mL  1,000 mL Intravenous Once Jimmye Norman, NP   1,000 mL at 12/18/11 2334   Medications Prior to Admission  Medication Sig Dispense Refill  . doxycycline (VIBRAMYCIN) 100 MG capsule Take 100 mg by mouth 2 (two) times daily.        [name] ibuprofen (ADVIL,MOTRIN) 800 MG tablet Take 800 mg by mouth 3 (three) times daily.        . insulin glargine (LANTUS) 100 UNIT/ML injection Inject 25 Units into the skin at bedtime.        . insulin lispro (HUMALOG) 100 UNIT/ML injection Inject 10 Units into the skin 3 (three) times daily before meals.        . traMADol (ULTRAM) 50 MG tablet Take 50 mg by mouth every 6 (six) hours as needed. For pain. Maximum dose= 8 tablets per day         Results  for orders placed during the hospital encounter of 12/18/11 (from the past 48 hour(s))  CBC     Status: Abnormal   Collection Time   12/18/11  8:48 PM      Component Value Range Comment   WBC 12.7 (*) 4.0 - 10.5 (K/uL)    RBC 4.55  4.22 - 5.81 (MIL/uL)    Hemoglobin 13.9  13.0 - 17.0 (g/dL)    HCT 16.1 (*) 09.6 - 52.0 (%)    MCV 83.3  78.0 - 100.0 (fL)    MCH 30.5  26.0 - 34.0 (pg)    MCHC 36.7 (*) 30.0 - 36.0 (g/dL)    RDW 04.5  40.9 - 81.1 (%)    Platelets 339  150 - 400 (K/uL)   DIFFERENTIAL     Status: Abnormal   Collection Time   12/18/11  8:48 PM      Component Value Range Comment   Neutrophils Relative 71  43 - 77 (%)    Neutro Abs 9.1 (*) 1.7 - 7.7 (K/uL)    Lymphocytes Relative 16  12 - 46 (%)    Lymphs Abs 2.1  0.7 - 4.0 (K/uL)    Monocytes Relative 9  3 -  12 (%)    Monocytes Absolute 1.2 (*) 0.1 - 1.0 (K/uL)    Eosinophils Relative 3  0 - 5 (%)    Eosinophils Absolute 0.4  0.0 - 0.7 (K/uL)    Basophils Relative 1  0 - 1 (%)    Basophils Absolute 0.1  0.0 - 0.1 (K/uL)   BASIC METABOLIC PANEL     Status: Abnormal   Collection Time   12/18/11  8:48 PM      Component Value Range Comment   Sodium 134 (*) 135 - 145 (mEq/L)    Potassium 3.5  3.5 - 5.1 (mEq/L)    Chloride 95 (*) 96 - 112 (mEq/L)    CO2 30  19 - 32 (mEq/L)    Glucose, Bld 387 (*) 70 - 99 (mg/dL)    BUN 8  6 - 23 (mg/dL)    Creatinine, Ser 1.61 (*) 0.50 - 1.35 (mg/dL)    Calcium 9.5  8.4 - 10.5 (mg/dL)    GFR calc non Af Amer >90  >90 (mL/min)    GFR calc Af Amer >90  >90 (mL/min)    Dg Hand Complete Left  12/18/2011  *RADIOLOGY REPORT*  Clinical Data: Left thumb wound.  Prior laceration.  Erythema. Diabetes.  LEFT HAND - COMPLETE 3+ VIEW  Comparison: None.  Findings: No fracture, foreign body, or acute bony findings are identified.  No findings of osteomyelitis or gas in the soft tissues.  IMPRESSION: 1.  No fracture, foreign body, findings of osteomyelitis, or gas in the soft tissues.  Original Report  Authenticated By: Dellia Cloud, M.D.   Pt with scalp infection had i/d in er per patient   Blood pressure 129/77, pulse 108, temperature 98.3 F (36.8 C), temperature source Oral, resp. rate 16, height 6\' 2"  (1.88 m), weight 61.236 kg (135 lb), SpO2 97.00%. General Appearance:  Alert, cooperative, no distress, appears stated age  Head:  Normocephalic, without obvious abnormality, atraumatic  Eyes:  Pupils equal, conjunctiva/corneas clear,         Throat: Lips, mucosa, and tongue normal; teeth and gums normal  Neck: No visible masses     Lungs:   respirations unlabored  Chest Wall:  No tenderness or deformity  Heart:  Regular rate and rhythm,  Abdomen:   Soft, non-tender,         Extremities: Left thumb large amount of swelling small open wound but no purulence. Thumb warm well perfused. Swelling extends to wrist and dorsoradial forearm. Other fingers not involved. Mild fluctuance over dorsum of thumb.  Pulses: 2+ and symmetric  Skin: Skin color, texture, turgor normal, no rashes or lesions     Neurologic: Normal     Assessment/Plan Left thumb abscess  TO OR in several hours for urgent i/d of left thumb Will admit for iv vanc with concern for mrsa infection Wound dressed.  R/B/A DISCUSSED WITH PT IN ED PT VOICED UNDERSTANDING OF PLAN CONSENT SIGNED DAY OF SURGERY PT SEEN AND EXAMINED PRIOR TO OPERATIVE PROCEDURE/DAY OF SURGERY SITE MARKED. QUESTIONS ANSWERED WILL stay as inpatient FOLLOWING SURGERY  Sharma Covert 12/18/2011, 11:47 PM

## 2011-12-18 NOTE — ED Notes (Signed)
Patient transported to X-ray 

## 2011-12-19 ENCOUNTER — Inpatient Hospital Stay (HOSPITAL_COMMUNITY): Payer: Worker's Compensation | Admitting: Certified Registered"

## 2011-12-19 ENCOUNTER — Encounter (HOSPITAL_COMMUNITY): Payer: Self-pay | Admitting: Certified Registered"

## 2011-12-19 ENCOUNTER — Encounter (HOSPITAL_COMMUNITY)
Admission: EM | Disposition: A | Discharge status: Home / Self Care | Payer: Self-pay | Source: Ambulatory Visit | Attending: Orthopedic Surgery

## 2011-12-19 ENCOUNTER — Encounter (HOSPITAL_COMMUNITY): Payer: Self-pay | Admitting: *Deleted

## 2011-12-19 LAB — GLUCOSE, CAPILLARY
Glucose-Capillary: 126 mg/dL — ABNORMAL HIGH (ref 70–99)
Glucose-Capillary: 146 mg/dL — ABNORMAL HIGH (ref 70–99)
Glucose-Capillary: 149 mg/dL — ABNORMAL HIGH (ref 70–99)
Glucose-Capillary: 270 mg/dL — ABNORMAL HIGH (ref 70–99)

## 2011-12-19 SURGERY — INCISION AND DRAINAGE, ABSCESS
Anesthesia: General | Site: Thumb | Laterality: Left | Wound class: Dirty or Infected

## 2011-12-19 SURGERY — IRRIGATION AND DEBRIDEMENT EXTREMITY
Anesthesia: General | Laterality: Left

## 2011-12-19 MED ORDER — INSULIN ASPART 100 UNIT/ML ~~LOC~~ SOLN
SUBCUTANEOUS | Status: AC
  Filled 2011-12-19: qty 1

## 2011-12-19 MED ORDER — HYDROMORPHONE HCL PF 1 MG/ML IJ SOLN
0.2500 mg | INTRAMUSCULAR | Status: DC | PRN
  Administered 2011-12-19 (×2): 0.5 mg via INTRAVENOUS
  Administered 2011-12-19: 1 mg via INTRAVENOUS
  Filled 2011-12-19 (×2): qty 1

## 2011-12-19 MED ORDER — MIDAZOLAM HCL 5 MG/5ML IJ SOLN
INTRAMUSCULAR | Status: DC | PRN
  Administered 2011-12-19: 2 mg via INTRAVENOUS

## 2011-12-19 MED ORDER — DOCUSATE SODIUM 100 MG PO CAPS: 100.0000 mg | ORAL_CAPSULE | Freq: Two times a day (BID) | ORAL | Status: DC

## 2011-12-19 MED ORDER — METOCLOPRAMIDE HCL 5 MG/ML IJ SOLN
5.0000 mg | Freq: Three times a day (TID) | INTRAMUSCULAR | Status: DC | PRN
  Filled 2011-12-19: qty 2

## 2011-12-19 MED ORDER — PROMETHAZINE HCL 25 MG/ML IJ SOLN: 6.2500 mg | INTRAMUSCULAR | Status: DC | PRN

## 2011-12-19 MED ORDER — MORPHINE SULFATE 2 MG/ML IJ SOLN
1.0000 mg | INTRAMUSCULAR | Status: DC | PRN
  Administered 2011-12-19 – 2011-12-21 (×4): 1 mg via INTRAVENOUS
  Administered 2011-12-21: 0.5 mg via INTRAVENOUS
  Administered 2011-12-21 – 2011-12-22 (×3): 1 mg via INTRAVENOUS
  Filled 2011-12-19 (×6): qty 1

## 2011-12-19 MED ORDER — MEPERIDINE HCL 25 MG/ML IJ SOLN: 6.2500 mg | INTRAMUSCULAR | Status: DC | PRN

## 2011-12-19 MED ORDER — METHOCARBAMOL 100 MG/ML IJ SOLN
500.0000 mg | Freq: Four times a day (QID) | INTRAMUSCULAR | Status: DC | PRN
  Filled 2011-12-19: qty 5

## 2011-12-19 MED ORDER — DIPHENHYDRAMINE HCL 12.5 MG/5ML PO ELIX
12.5000 mg | ORAL_SOLUTION | ORAL | Status: DC | PRN
  Filled 2011-12-19: qty 10

## 2011-12-19 MED ORDER — MUPIROCIN 2 % EX OINT
1.0000 "application " | TOPICAL_OINTMENT | Freq: Two times a day (BID) | CUTANEOUS | Status: DC
  Administered 2011-12-19 – 2011-12-22 (×7): 1 via NASAL
  Filled 2011-12-19: qty 22

## 2011-12-19 MED ORDER — PHENYLEPHRINE HCL 10 MG/ML IJ SOLN
INTRAMUSCULAR | Status: DC | PRN
  Administered 2011-12-19 (×2): 80 ug via INTRAVENOUS

## 2011-12-19 MED ORDER — ONDANSETRON HCL 4 MG/2ML IJ SOLN
INTRAMUSCULAR | Status: DC | PRN
  Administered 2011-12-19: 4 mg via INTRAVENOUS

## 2011-12-19 MED ORDER — ONDANSETRON HCL 4 MG/2ML IJ SOLN: 4.0000 mg | Freq: Four times a day (QID) | INTRAMUSCULAR | Status: DC | PRN

## 2011-12-19 MED ORDER — METOCLOPRAMIDE HCL 10 MG PO TABS: 5.0000 mg | ORAL_TABLET | Freq: Three times a day (TID) | ORAL | Status: DC | PRN

## 2011-12-19 MED ORDER — METHOCARBAMOL 500 MG PO TABS: 500.0000 mg | ORAL_TABLET | Freq: Four times a day (QID) | ORAL | Status: DC | PRN

## 2011-12-19 MED ORDER — HYDROCODONE-ACETAMINOPHEN 10-325 MG PO TABS
1.0000 | ORAL_TABLET | ORAL | Status: DC | PRN
  Administered 2011-12-19 – 2011-12-22 (×5): 2 via ORAL
  Filled 2011-12-19 (×5): qty 2

## 2011-12-19 MED ORDER — ZOLPIDEM TARTRATE 5 MG PO TABS
5.0000 mg | ORAL_TABLET | Freq: Every evening | ORAL | Status: DC | PRN
  Administered 2011-12-19 – 2011-12-21 (×3): 5 mg via ORAL
  Filled 2011-12-19 (×3): qty 1

## 2011-12-19 MED ORDER — VANCOMYCIN HCL IN DEXTROSE 1-5 GM/200ML-% IV SOLN: 1000.0000 mg | INTRAVENOUS | Status: DC

## 2011-12-19 MED ORDER — ONDANSETRON HCL 4 MG PO TABS: 4.0000 mg | ORAL_TABLET | Freq: Four times a day (QID) | ORAL | Status: DC | PRN

## 2011-12-19 MED ORDER — DOCUSATE SODIUM 100 MG PO CAPS
100.0000 mg | ORAL_CAPSULE | Freq: Two times a day (BID) | ORAL | Status: DC
  Administered 2011-12-19 – 2011-12-22 (×8): 100 mg via ORAL
  Filled 2011-12-19 (×8): qty 1

## 2011-12-19 MED ORDER — INSULIN LISPRO 100 UNIT/ML ~~LOC~~ SOLN
10.0000 [IU] | Freq: Three times a day (TID) | SUBCUTANEOUS | Status: DC
  Administered 2011-12-19 – 2011-12-21 (×6): 10 [IU] via SUBCUTANEOUS
  Filled 2011-12-19: qty 3

## 2011-12-19 MED ORDER — HYDROCODONE-ACETAMINOPHEN 5-325 MG PO TABS
1.0000 | ORAL_TABLET | ORAL | Status: DC | PRN
  Administered 2011-12-19 (×2): 2 via ORAL
  Filled 2011-12-19: qty 2

## 2011-12-19 MED ORDER — INSULIN GLARGINE 100 UNIT/ML ~~LOC~~ SOLN
SUBCUTANEOUS | Status: AC
  Administered 2011-12-19: 25 [IU] via SUBCUTANEOUS
  Filled 2011-12-19: qty 1

## 2011-12-19 MED ORDER — METHOCARBAMOL 100 MG/ML IJ SOLN
500.0000 mg | Freq: Four times a day (QID) | INTRAVENOUS | Status: DC | PRN
  Filled 2011-12-19: qty 5

## 2011-12-19 MED ORDER — OXYCODONE HCL 5 MG PO TABS
5.0000 mg | ORAL_TABLET | ORAL | Status: DC | PRN
  Filled 2011-12-19 (×12): qty 2

## 2011-12-19 MED ORDER — INSULIN GLARGINE 100 UNIT/ML ~~LOC~~ SOLN
25.0000 [IU] | Freq: Every day | SUBCUTANEOUS | Status: DC
  Filled 2011-12-19: qty 3

## 2011-12-19 MED ORDER — HYDROMORPHONE HCL PF 1 MG/ML IJ SOLN
0.5000 mg | INTRAMUSCULAR | Status: DC | PRN
  Administered 2011-12-19 – 2011-12-22 (×15): 1 mg via INTRAVENOUS
  Filled 2011-12-19 (×13): qty 1

## 2011-12-19 MED ORDER — INSULIN ASPART 100 UNIT/ML ~~LOC~~ SOLN: 0.0000 [IU] | SUBCUTANEOUS | Status: DC

## 2011-12-19 MED ORDER — VANCOMYCIN HCL IN DEXTROSE 1-5 GM/200ML-% IV SOLN
1000.0000 mg | Freq: Three times a day (TID) | INTRAVENOUS | Status: DC
  Administered 2011-12-19 – 2011-12-21 (×6): 1000 mg via INTRAVENOUS
  Filled 2011-12-19 (×8): qty 200

## 2011-12-19 MED ORDER — VANCOMYCIN HCL IN DEXTROSE 1-5 GM/200ML-% IV SOLN
1000.0000 mg | Freq: Two times a day (BID) | INTRAVENOUS | Status: DC
  Filled 2011-12-19: qty 200

## 2011-12-19 MED ORDER — LACTATED RINGERS IV SOLN
INTRAVENOUS | Status: DC | PRN
  Administered 2011-12-19: 07:00:00 via INTRAVENOUS

## 2011-12-19 MED ORDER — OXYCODONE HCL 5 MG PO TABS
5.0000 mg | ORAL_TABLET | ORAL | Status: DC | PRN
  Administered 2011-12-19 – 2011-12-21 (×10): 10 mg via ORAL
  Administered 2011-12-21: 5 mg via ORAL
  Administered 2011-12-21 – 2011-12-22 (×5): 10 mg via ORAL
  Filled 2011-12-19 (×4): qty 2

## 2011-12-19 MED ORDER — VANCOMYCIN HCL IN DEXTROSE 1-5 GM/200ML-% IV SOLN
1000.0000 mg | INTRAVENOUS | Status: AC
  Administered 2011-12-19 (×2): 1000 mg via INTRAVENOUS
  Filled 2011-12-19: qty 200

## 2011-12-19 MED ORDER — INSULIN GLARGINE 100 UNIT/ML ~~LOC~~ SOLN
SUBCUTANEOUS | Status: AC
  Filled 2011-12-19: qty 1

## 2011-12-19 MED ORDER — CHLORHEXIDINE GLUCONATE CLOTH 2 % EX PADS
6.0000 | MEDICATED_PAD | Freq: Every day | CUTANEOUS | Status: DC
  Administered 2011-12-19 – 2011-12-21 (×4): 6 via TOPICAL

## 2011-12-19 MED ORDER — MIDAZOLAM HCL 2 MG/2ML IJ SOLN
0.5000 mg | Freq: Once | INTRAMUSCULAR | Status: AC | PRN
  Administered 2011-12-19: 1 mg via INTRAVENOUS

## 2011-12-19 MED ORDER — METHOCARBAMOL 500 MG PO TABS
500.0000 mg | ORAL_TABLET | Freq: Four times a day (QID) | ORAL | Status: DC | PRN
  Administered 2011-12-19 – 2011-12-22 (×9): 500 mg via ORAL
  Filled 2011-12-19 (×9): qty 1

## 2011-12-19 MED ORDER — POTASSIUM CHLORIDE IN NACL 20-0.45 MEQ/L-% IV SOLN
INTRAVENOUS | Status: DC
  Administered 2011-12-19: 03:00:00 via INTRAVENOUS
  Administered 2011-12-19: 1000 mL via INTRAVENOUS
  Administered 2011-12-20 – 2011-12-21 (×2): via INTRAVENOUS
  Filled 2011-12-19 (×6): qty 1000

## 2011-12-19 MED ORDER — PROPOFOL 10 MG/ML IV EMUL
INTRAVENOUS | Status: DC | PRN
  Administered 2011-12-19: 200 mg via INTRAVENOUS

## 2011-12-19 MED ORDER — MORPHINE SULFATE 2 MG/ML IJ SOLN
0.0500 mg/kg | INTRAMUSCULAR | Status: DC | PRN
  Filled 2011-12-19: qty 1

## 2011-12-19 MED ORDER — FENTANYL CITRATE 0.05 MG/ML IJ SOLN
INTRAMUSCULAR | Status: DC | PRN
  Administered 2011-12-19: 100 ug via INTRAVENOUS

## 2011-12-19 SURGICAL SUPPLY — 50 items
BANDAGE CONFORM 2  STR LF (GAUZE/BANDAGES/DRESSINGS) IMPLANT
BANDAGE ELASTIC 3 VELCRO ST LF (GAUZE/BANDAGES/DRESSINGS) ×2 IMPLANT
BANDAGE ELASTIC 4 VELCRO ST LF (GAUZE/BANDAGES/DRESSINGS) ×2 IMPLANT
BANDAGE GAUZE ELAST BULKY 4 IN (GAUZE/BANDAGES/DRESSINGS) ×2 IMPLANT
BNDG COHESIVE 1X5 TAN STRL LF (GAUZE/BANDAGES/DRESSINGS) IMPLANT
BNDG ESMARK 4X9 LF (GAUZE/BANDAGES/DRESSINGS) ×2 IMPLANT
CLOTH BEACON ORANGE TIMEOUT ST (SAFETY) ×2 IMPLANT
CORDS BIPOLAR (ELECTRODE) ×2 IMPLANT
COVER SURGICAL LIGHT HANDLE (MISCELLANEOUS) ×2 IMPLANT
CUFF TOURNIQUET SINGLE 18IN (TOURNIQUET CUFF) ×2 IMPLANT
CUFF TOURNIQUET SINGLE 24IN (TOURNIQUET CUFF) IMPLANT
DRAIN PENROSE 1/4X12 LTX STRL (WOUND CARE) IMPLANT
DRAPE SURG 17X23 STRL (DRAPES) ×2 IMPLANT
DRSG ADAPTIC 3X8 NADH LF (GAUZE/BANDAGES/DRESSINGS) ×2 IMPLANT
ELECT REM PT RETURN 9FT ADLT (ELECTROSURGICAL)
ELECTRODE REM PT RTRN 9FT ADLT (ELECTROSURGICAL) IMPLANT
GAUZE XEROFORM 1X8 LF (GAUZE/BANDAGES/DRESSINGS) ×2 IMPLANT
GAUZE XEROFORM 5X9 LF (GAUZE/BANDAGES/DRESSINGS) IMPLANT
GLOVE BIOGEL PI IND STRL 8.5 (GLOVE) ×1 IMPLANT
GLOVE BIOGEL PI INDICATOR 8.5 (GLOVE) ×1
GLOVE SURG ORTHO 8.0 STRL STRW (GLOVE) ×2 IMPLANT
GOWN PREVENTION PLUS XLARGE (GOWN DISPOSABLE) ×2 IMPLANT
GOWN STRL NON-REIN LRG LVL3 (GOWN DISPOSABLE) ×6 IMPLANT
HANDPIECE INTERPULSE COAX TIP (DISPOSABLE)
KIT BASIN OR (CUSTOM PROCEDURE TRAY) ×2 IMPLANT
KIT ROOM TURNOVER OR (KITS) ×2 IMPLANT
MANIFOLD NEPTUNE II (INSTRUMENTS) ×2 IMPLANT
NEEDLE HYPO 25GX1X1/2 BEV (NEEDLE) IMPLANT
NS IRRIG 1000ML POUR BTL (IV SOLUTION) ×2 IMPLANT
PACK ORTHO EXTREMITY (CUSTOM PROCEDURE TRAY) ×2 IMPLANT
PAD ARMBOARD 7.5X6 YLW CONV (MISCELLANEOUS) ×4 IMPLANT
PAD CAST 4YDX4 CTTN HI CHSV (CAST SUPPLIES) ×1 IMPLANT
PADDING CAST COTTON 4X4 STRL (CAST SUPPLIES) ×1
SET HNDPC FAN SPRY TIP SCT (DISPOSABLE) IMPLANT
SOAP 2 % CHG 4 OZ (WOUND CARE) ×2 IMPLANT
SPONGE GAUZE 4X4 12PLY (GAUZE/BANDAGES/DRESSINGS) ×2 IMPLANT
SPONGE LAP 18X18 X RAY DECT (DISPOSABLE) ×2 IMPLANT
SPONGE LAP 4X18 X RAY DECT (DISPOSABLE) ×2 IMPLANT
SUCTION FRAZIER TIP 10 FR DISP (SUCTIONS) ×2 IMPLANT
SUT ETHILON 4 0 PS 2 18 (SUTURE) IMPLANT
SUT ETHILON 5 0 P 3 18 (SUTURE) ×1
SUT NYLON ETHILON 5-0 P-3 1X18 (SUTURE) ×1 IMPLANT
SYR CONTROL 10ML LL (SYRINGE) IMPLANT
TOWEL OR 17X24 6PK STRL BLUE (TOWEL DISPOSABLE) ×2 IMPLANT
TOWEL OR 17X26 10 PK STRL BLUE (TOWEL DISPOSABLE) ×2 IMPLANT
TUBE ANAEROBIC SPECIMEN COL (MISCELLANEOUS) IMPLANT
TUBE CONNECTING 12X1/4 (SUCTIONS) ×2 IMPLANT
UNDERPAD 30X30 INCONTINENT (UNDERPADS AND DIAPERS) ×2 IMPLANT
WATER STERILE IRR 1000ML POUR (IV SOLUTION) ×2 IMPLANT
YANKAUER SUCT BULB TIP NO VENT (SUCTIONS) ×2 IMPLANT

## 2011-12-19 SURGICAL SUPPLY — 22 items
BANDAGE ELASTIC 3 VELCRO ST LF (GAUZE/BANDAGES/DRESSINGS) ×2 IMPLANT
BANDAGE GAUZE ELAST BULKY 4 IN (GAUZE/BANDAGES/DRESSINGS) ×2 IMPLANT
BNDG ELASTIC 2 VLCR STRL LF (GAUZE/BANDAGES/DRESSINGS) ×2 IMPLANT
CANISTER SUCTION 2500CC (MISCELLANEOUS) ×2 IMPLANT
CLOTH BEACON ORANGE TIMEOUT ST (SAFETY) ×2 IMPLANT
COVER SURGICAL LIGHT HANDLE (MISCELLANEOUS) ×2 IMPLANT
DRSG EMULSION OIL 3X3 NADH (GAUZE/BANDAGES/DRESSINGS) ×2 IMPLANT
GAUZE SPONGE 4X4 12PLY STRL LF (GAUZE/BANDAGES/DRESSINGS) ×2 IMPLANT
GLOVE BIOGEL PI IND STRL 7.0 (GLOVE) ×1 IMPLANT
GLOVE BIOGEL PI IND STRL 8.5 (GLOVE) ×1 IMPLANT
GLOVE BIOGEL PI INDICATOR 7.0 (GLOVE) ×1
GLOVE BIOGEL PI INDICATOR 8.5 (GLOVE) ×1
GLOVE ECLIPSE 6.5 STRL STRAW (GLOVE) ×2 IMPLANT
GLOVE SURG ORTHO 8.0 STRL STRW (GLOVE) ×2 IMPLANT
GOWN PREVENTION PLUS XLARGE (GOWN DISPOSABLE) ×2 IMPLANT
GOWN STRL NON-REIN LRG LVL3 (GOWN DISPOSABLE) ×2 IMPLANT
KIT BASIN OR (CUSTOM PROCEDURE TRAY) ×2 IMPLANT
PACK ORTHO EXTREMITY (CUSTOM PROCEDURE TRAY) ×2 IMPLANT
SUCTION FRAZIER TIP 10 FR DISP (SUCTIONS) ×2 IMPLANT
TOWEL OR 17X24 6PK STRL BLUE (TOWEL DISPOSABLE) ×2 IMPLANT
TOWEL OR 17X26 10 PK STRL BLUE (TOWEL DISPOSABLE) ×2 IMPLANT
TUBE CONNECTING 12X1/4 (SUCTIONS) ×2 IMPLANT

## 2011-12-19 NOTE — ED Notes (Signed)
Neuro vascular check presents Pt left hand with kerlex bandage in position of function. Pulses present, sensation intact, cap refill <2, skin warm dry. Will continue to monitor.l

## 2011-12-19 NOTE — Op Note (Signed)
NAMEALVIE, FOWLES NO.:  [id number]  MEDICAL RECORD NO.:  [id number]  LOCATION:  5035                         FACILITY:  [facility]  PHYSICIAN:  Madelynn Done, MD  DATE OF BIRTH:  [birth year]  DATE OF PROCEDURE:  12/19/2011 DATE OF DISCHARGE:                              OPERATIVE REPORT   PREOPERATIVE DIAGNOSIS:  Left thumb abscess, deep abscess.  POSTOPERATIVE DIAGNOSIS:  Left thumb abscess, deep abscess.  ATTENDING PHYSICIAN:  Sharma Covert IV, MD, who scrubbed and present the entire procedure.  ASSISTANT SURGEON:  None.  ANESTHESIA:  General via LMA.  SURGICAL PROCEDURE: 1. Left thumb drainage of a deep abscess with excisional debridement     of skin, subcutaneous tissue, and abscess area. 2. Left thumb tenosynovectomy, extensor pollicis longus and abductor     pollicis longus aggressive tenosynovectomy of the course of the     tendons.  SURGICAL INDICATIONS:  Mr. Stern is a 26 year old gentleman who sustained a laceration where sustaining open wound to his left thumb. The patient was seen and was placed on wound got infected.  The patient was seen and evaluated at [facility], continued to progress who presented to [facility] on December 18, 2011.  It was recommended that the undergo the above procedure.  Risks, benefits, and alternatives discussed in detail with the patient, and a signed informed consent was obtained.  Risks include, but not limited to bleeding, infection, damage to nearby nerves, arteries, or tendons, loss of motion of the elbow, wrist and digits, and need for further surgical intervention.  DESCRIPTION OF PROCEDURE:  The patient was properly identified in preop holding area and a mark with permanent marker was made on left thumb to indicate correct operative site.  The patient was brought back to the operating room and placed supine on the anesthesia room table.  General anesthesia administered.  The patient had been on IV  vancomycin.  Well- padded tourniquet was then placed in the left brachium and sealed with 1000 drape.  Left upper extremity was prepped and draped in normal sterile fashion.  Time-out was called.  Correct site was identified and procedure then begun.  Attention was then turned to the left thumb where the patient did have a necrotic carry over the dorsal aspect of the thumb that was incised longitudinally and this necrotic area was then excised sharply in elliptical fashion.  The abscess area was then opened up and the patient had gross purulence and his wound cultures were then taken.  Following this, dissection was then carried all the way down to the tendon sheath where aggressive tenosynovectomy was then carried out along the EPL, EPB and APL.  Tenosynovectomy was then carried all the way out extending into the dorsum of the metacarpal.  After removing the necrotic tissue, extensively using sharp scissors, knives and rongeurs, the abscess area was then decompressed and then thoroughly drained.  It was closed following thorough irrigation and debridement, sharp excisional debridement.  The wound was loosely closed over small blue vessel loops.  Adaptic dressing and sterile compressive bandage was then applied.  The patient was then placed in a well-padded thumb spica splint, extubated, and taken  to recovery room in good condition.  POSTOPERATIVE PLAN:  The patient was admitted back to the floor for IV antibiotics and pain control.  Wound check in approximately 48 hours, and then see whether or not the patient is going to need repeat I and D given his response to the above treatment modality and the IV therapy.     Madelynn Done, MD     FWO/MEDQ  D:  12/19/2011  T:  12/19/2011  Job:  [id number]

## 2011-12-19 NOTE — Progress Notes (Signed)
ANTIBIOTIC CONSULT NOTE - INITIAL  Pharmacy Consult for vancomycin Indication: thumb infection, abscess/possible MRSA  Allergies  Allergen Reactions  . Sulfa Antibiotics Other (See Comments)    unknown    Patient Measurements: Height: 6\' 2"  (188 cm) Weight: 135 lb (61.236 kg) IBW/kg (Calculated) : 82.2  Adjusted Body Weight: 61  Vital Signs: Temp: 98.3 F (36.8 C) (12/31 2052) Temp src: Oral (12/31 2052) BP: 129/77 mmHg (12/31 2052) Pulse Rate: 108  (12/31 2052) Intake/Output from previous day:   Intake/Output from this shift:    Labs:  [facility] 12/18/11 2048  WBC 12.7*  HGB 13.9  PLT 339  LABCREA --  CREATININE 0.44*   Estimated Creatinine Clearance: 120.1 ml/min (by C-G formula based on Cr of 0.44). No results found for this basename: VANCOTROUGH:2,VANCOPEAK:2,VANCORANDOM:2,GENTTROUGH:2,GENTPEAK:2,GENTRANDOM:2,TOBRATROUGH:2,TOBRAPEAK:2,TOBRARND:2,AMIKACINPEAK:2,AMIKACINTROU:2,AMIKACIN:2, in the last 72 hours   Microbiology: No results found for this or any previous visit (from the past 720 hour(s)).  Medical History: Past Medical History  Diagnosis Date  . Diabetes mellitus     Medications:   Anti-infectives     Start     Dose/Rate Route Frequency Ordered Stop   12/19/11 0800   vancomycin (VANCOCIN) IVPB 1000 mg/200 mL premix        1,000 mg 200 mL/hr over 60 Minutes Intravenous Every 8 hours 12/19/11 0006     12/19/11 0015   vancomycin (VANCOCIN) IVPB 1000 mg/200 mL premix        1,000 mg 200 mL/hr over 60 Minutes Intra[facility]ous [facility] 12/19/11 0005 12/20/11 0015         Assessment: 25 yom admitted to [facility] for worsening thumb infection/abscess with possible MRSA infection. Patient has been treated with doxycycline as outpatient. He has a significant past medical history of DM. Renal fx is excellent given age so patient will be started on q 8 hour dosing. Noted plans to take patient to OR for urgent I&D. Goal of Therapy:  Vancomycin  trough level 10-15 mcg/ml  Plan:  Vancomycin 1000mg  IV q 8 hours  Follow culture data and renal function for changes Follow up after OR for any changes Check vancomycin trough at steady state or as indicated.  Severiano Gilbert 12/19/2011,12:07 AM

## 2011-12-19 NOTE — ED Notes (Signed)
Pt AOx4, resp e/u, at time of admission.

## 2011-12-19 NOTE — Anesthesia Postprocedure Evaluation (Signed)
  Anesthesia Post-op Note  Patient: [name]  Procedure(s) Performed:  INCISION AND DRAINAGE ABSCESS  Patient Location: PACU  Anesthesia Type: General  Level of Consciousness: awake  Airway and Oxygen Therapy: Patient Spontanous Breathing  Post-op Pain: mild  Post-op Assessment: Post-op Vital signs reviewed  Post-op Vital Signs: stable  Complications: No apparent anesthesia complications

## 2011-12-19 NOTE — ED Provider Notes (Signed)
Medical screening examination/treatment/procedure(s) were conducted as a shared visit with non-physician practitioner(s) and myself.  I personally evaluated the patient during the encounter  Destanee Bedonie, MD 12/19/11 0110 

## 2011-12-19 NOTE — Anesthesia Preprocedure Evaluation (Addendum)
Anesthesia Evaluation  Patient identified by MRN, date of birth, ID band Patient awake    Reviewed: Allergy & Precautions, H&P , NPO status , Patient's Chart, lab work & pertinent test results  Airway Mallampati: I TM Distance: >3 FB Neck ROM: Full    Dental  (+) Teeth Intact and Dental Advisory Given   Pulmonary neg pulmonary ROS,  clear to auscultation        Cardiovascular Regular Normal    Neuro/Psych Depression Negative Neurological ROS     GI/Hepatic negative GI ROS, Neg liver ROS,   Endo/Other  Negative Endocrine ROSDiabetes mellitus-  Renal/GU negative Renal ROS     Musculoskeletal   Abdominal   Peds  Hematology negative hematology ROS (+)   Anesthesia Other Findings   Reproductive/Obstetrics                          Anesthesia Physical Anesthesia Plan  ASA: III  Anesthesia Plan: General   Post-op Pain Management:    Induction: Intravenous  Airway Management Planned:   Additional Equipment:   Intra-op Plan:   Post-operative Plan:   Informed Consent: I have reviewed the patients History and Physical, chart, labs and discussed the procedure including the risks, benefits and alternatives for the proposed anesthesia with the patient or authorized representative who has indicated his/her understanding and acceptance.     Plan Discussed with: CRNA  Anesthesia Plan Comments:         Anesthesia Quick Evaluation

## 2011-12-19 NOTE — OR Nursing (Signed)
Much calmer. Quiter, pain much better per pt/ now eating crackers and drinking soda/ asking many questions re surgery  And follow up/

## 2011-12-19 NOTE — Brief Op Note (Signed)
12/18/2011 - 12/19/2011  9:02 AM  PATIENT:  [name]  26 y.o. male  PRE-OPERATIVE DIAGNOSIS:  Left thumb abscess  POST-OPERATIVE DIAGNOSIS:  Left thumb abscess  PROCEDURE:  Procedure(s): INCISION AND DRAINAGE ABSCESS  SURGEON:  Surgeon(s): Sharma Covert  PHYSICIAN ASSISTANT:   ASSISTANTS: none   ANESTHESIA:   none  EBL:  Total I/O In: 900 [I.V.:900] Out: 10 [Blood:10]  BLOOD ADMINISTERED:none  DRAINS: One blue vessel loop  LOCAL MEDICATIONS USED:  NONE  SPECIMEN:  No Specimen  DISPOSITION OF SPECIMEN:  N/A  COUNTS:  YES  TOURNIQUET:   Total Tourniquet Time Documented: Upper Arm (Left) - 11 minutes  DICTATION: .Other Dictation: Dictation Number [id number]  PLAN OF CARE: Admit to inpatient   PATIENT DISPOSITION:  PACU - hemodynamically stable.   Delay start of Pharmacological VTE agent (>24hrs) due to surgical blood loss or risk of bleeding:  {YES/NO/NOT APPLICABLE:20182

## 2011-12-19 NOTE — Transfer of Care (Signed)
Immediate Anesthesia Transfer of Care Note  Patient: [name]  Procedure(s) Performed:  INCISION AND DRAINAGE ABSCESS  Patient Location: PACU  Anesthesia Type: General  Level of Consciousness: awake, alert  and oriented  Airway & Oxygen Therapy: Patient Spontanous Breathing and Patient connected to nasal cannula oxygen  Post-op Assessment: Report given to PACU RN, Post -op Vital signs reviewed and stable and Patient moving all extremities X 4  Post vital signs: Reviewed and stable  Complications: No apparent anesthesia complications

## 2011-12-20 ENCOUNTER — Encounter (HOSPITAL_COMMUNITY): Payer: Self-pay | Admitting: Orthopedic Surgery

## 2011-12-20 LAB — GLUCOSE, CAPILLARY
Glucose-Capillary: 252 mg/dL — ABNORMAL HIGH (ref 70–99)
Glucose-Capillary: 180 mg/dL — ABNORMAL HIGH (ref 70–99)
Glucose-Capillary: 194 mg/dL — ABNORMAL HIGH (ref 70–99)
Glucose-Capillary: 225 mg/dL — ABNORMAL HIGH (ref 70–99)

## 2011-12-20 NOTE — Progress Notes (Signed)
Subjective: Left thumb pain   Objective: Vital signs in last 24 hours: Temp:  [97.6 F (36.4 C)-98.3 F (36.8 C)] 98.3 F (36.8 C) (01/02 1943) Pulse Rate:  [90-92] 92  (01/02 1943) Resp:  [18] 18  (01/02 1943) BP: (112-128)/(67-75) 117/67 mmHg (01/02 1943) SpO2:  [98 %-100 %] 100 % (01/02 1943)  Intake/Output from previous day: 01/01 0701 - 01/02 0700 In: 1740 [P.O.:840; I.V.:900] Out: 12 [Urine:2; Blood:10] Intake/Output this shift:     Basename 12/18/11 2048  HGB 13.9    Basename 12/18/11 2048  WBC 12.7*  RBC 4.55  HCT 37.9*  PLT 339    Basename 12/18/11 2048  NA 134*  K 3.5  CL 95*  CO2 30  BUN 8  CREATININE 0.44*  GLUCOSE 387*  CALCIUM 9.5   No results found for this basename: LABPT:2,INR:2 in the last 72 hours  Thumb tip warm well perfused dressing in place  Assessment/Plan: Left thumb abscess s/p incision and drainage  Continue with iv vanc Await wound cultures Will check wound 12/21/2011 If looks better may consider d/c home If worse may require repeat i/d   Sharma Covert 12/20/2011, 9:23 PM

## 2011-12-20 NOTE — Progress Notes (Signed)
Utilization review completed. Shekera Beavers, RN, BSN. 12/20/11 

## 2011-12-21 LAB — BASIC METABOLIC PANEL
Chloride: 100 mEq/L (ref 96–112)
Creatinine, Ser: 0.36 mg/dL — ABNORMAL LOW (ref 0.50–1.35)
GFR calc Af Amer: >90 mL/min (ref >90)
Potassium: 3.8 mEq/L (ref 3.5–5.1)
Sodium: 139 mEq/L (ref 135–145)

## 2011-12-21 LAB — VANCOMYCIN, TROUGH: Vancomycin Tr: 7.2 ug/mL — ABNORMAL LOW (ref 10.0–20.0)

## 2011-12-21 LAB — GLUCOSE, CAPILLARY
Glucose-Capillary: 129 mg/dL — ABNORMAL HIGH (ref 70–99)
Glucose-Capillary: 141 mg/dL — ABNORMAL HIGH (ref 70–99)

## 2011-12-21 MED ORDER — DOCUSATE SODIUM 100 MG PO CAPS: 100.0000 mg | ORAL_CAPSULE | Freq: Two times a day (BID) | ORAL | Status: AC

## 2011-12-21 MED ORDER — VANCOMYCIN HCL 1000 MG IV SOLR
1250.0000 mg | Freq: Three times a day (TID) | INTRAVENOUS | Status: DC
  Administered 2011-12-21 (×2): 1250 mg via INTRAVENOUS
  Filled 2011-12-21 (×6): qty 1250

## 2011-12-21 MED ORDER — INSULIN PEN STARTER KIT
1.0000 | Freq: Once | Status: AC
  Administered 2011-12-21: 1
  Filled 2011-12-21: qty 1

## 2011-12-21 MED ORDER — HYDROMORPHONE HCL 2 MG PO TABS: 4.0000 mg | ORAL_TABLET | ORAL | Status: AC | PRN

## 2011-12-21 MED ORDER — DOXYCYCLINE HYCLATE 100 MG PO CAPS: 100.0000 mg | ORAL_CAPSULE | Freq: Two times a day (BID) | ORAL | Status: AC

## 2011-12-21 MED ORDER — BD GETTING STARTED TAKE HOME KIT: 3/10ML X 30G SYRINGES
1.0000 | Freq: Once | Status: AC
  Administered 2011-12-21: 1
  Filled 2011-12-21: qty 1

## 2011-12-21 MED ORDER — VITAMIN C 500 MG PO TABS: 500.0000 mg | ORAL_TABLET | Freq: Every day | ORAL | Status: DC

## 2011-12-21 MED ORDER — TRAZODONE HCL 150 MG PO TABS
300.0000 mg | ORAL_TABLET | Freq: Every day | ORAL | Status: DC
  Administered 2011-12-21: 300 mg via ORAL
  Filled 2011-12-21 (×2): qty 2

## 2011-12-21 MED ORDER — METHOCARBAMOL 500 MG PO TABS: 500.0000 mg | ORAL_TABLET | Freq: Four times a day (QID) | ORAL | Status: AC

## 2011-12-21 MED ORDER — INSULIN ASPART 100 UNIT/ML ~~LOC~~ SOLN
10.0000 [IU] | Freq: Three times a day (TID) | SUBCUTANEOUS | Status: DC
  Administered 2011-12-21 – 2011-12-22 (×4): 10 [IU] via SUBCUTANEOUS

## 2011-12-21 NOTE — Progress Notes (Signed)
ANTIBIOTIC CONSULT NOTE - FOLLOW UP  Pharmacy Consult for Vancomycin Indication: r/o MRSA thumb infection/abscess  Allergies  Allergen Reactions  . Sulfa Antibiotics Other (See Comments)    unknown    Patient Measurements: Height: 6\' 2"  (188 cm) Weight: 135 lb (61.236 kg) IBW/kg (Calculated) : 82.2    Vital Signs: Temp: 98.1 F (36.7 C) (01/03 0458) BP: 111/67 mmHg (01/03 0458) Pulse Rate: 86  (01/03 0458) Intake/Output from previous day: 01/02 0701 - 01/03 0700 In: 3575.8 [P.O.:1360; I.V.:2015.8; IV Piggyback:200] Out: 4 [Urine:4] Intake/Output from this shift: Total I/O In: 200 [IV Piggyback:200] Out: -   Labs:  Basename 12/21/11 0626 12/18/11 2048  WBC -- 12.7*  HGB -- 13.9  PLT -- 339  LABCREA -- --  CREATININE 0.36* 0.44*   Estimated Creatinine Clearance: 120.1 ml/min (by C-G formula based on Cr of 0.36).  Basename 12/21/11 0626  VANCOTROUGH 7.2*  VANCOPEAK --  Drue Dun --  GENTTROUGH --  GENTPEAK --  GENTRANDOM --  TOBRATROUGH --  Nolen Mu --  TOBRARND --  AMIKACINPEAK --  AMIKACINTROU --  AMIKACIN --     Microbiology: Recent Results (from the past 720 hour(s))  SURGICAL PCR SCREEN     Status: Abnormal   Collection Time   12/19/11  2:54 AM      Component Value Range Status Comment   MRSA, PCR POSITIVE (*) NEGATIVE  Final    Staphylococcus aureus POSITIVE (*) NEGATIVE  Final   ANAEROBIC CULTURE     Status: Normal (Preliminary result)   Collection Time   12/19/11  8:05 AM      Component Value Range Status Comment   Specimen Description ABSCESS THUMB LEFT   Final    Special Requests NONE   Final    Gram Stain     Final    Value: NO WBC SEEN     NO SQUAMOUS EPITHELIAL CELLS SEEN     RARE GRAM POSITIVE COCCI     IN PAIRS IN CHAINS   Culture     Final    Value: NO ANAEROBES ISOLATED; CULTURE IN PROGRESS FOR 5 DAYS   Report Status PENDING   Incomplete   CULTURE, ROUTINE-ABSCESS     Status: Normal (Preliminary result)   Collection Time   12/19/11  8:05 AM      Component Value Range Status Comment   Specimen Description ABSCESS THUMB LEFT   Final    Special Requests NONE   Final    Gram Stain     Final    Value: NO WBC SEEN     NO SQUAMOUS EPITHELIAL CELLS SEEN     RARE GRAM POSITIVE COCCI     IN PAIRS IN CHAINS   Culture     Final    Value: MODERATE STAPHYLOCOCCUS AUREUS     Note: RIFAMPIN AND GENTAMICIN SHOULD NOT BE USED AS SINGLE DRUGS FOR TREATMENT OF STAPH INFECTIONS.   Report Status PENDING   Incomplete     Anti-infectives     Start     Dose/Rate Route Frequency Ordered Stop   12/19/11 0945   vancomycin (VANCOCIN) IVPB 1000 mg/200 mL premix  Status:  Discontinued        1,000 mg 200 mL/hr over 60 Minutes Intravenous Every 12 hours 12/19/11 0944 12/19/11 1027   12/19/11 0800   vancomycin (VANCOCIN) IVPB 1000 mg/200 mL premix        1,000 mg 200 mL/hr over 60 Minutes Intravenous Every 8 hours 12/19/11 0006  12/19/11 0200   vancomycin (VANCOCIN) IVPB 1000 mg/200 mL premix  Status:  Discontinued        1,000 mg 200 mL/hr over 60 Minutes Intravenous 60 min pre-op 12/19/11 0149 12/19/11 0209   12/19/11 0015   vancomycin (VANCOCIN) IVPB 1000 mg/200 mL premix        1,000 mg 200 mL/hr over 60 Minutes Intra[facility]ous [facility] 12/19/11 0005 12/19/11 0735          Assessment: 26 y/o male patient receiving day #2 vancomycin for thumb infection s/p I&D. Cx reveal staph with final c&s pending. Renal fxn stable. Afebrile. Obtained trough today =7.2 , goal 10-15, will increase dose.  Goal of Therapy:  Vancomycin trough level 10-15 mcg/ml  Plan:  Increase vancomycin to 1250mg  iv q8h and monitor renal fxn. Follow up culture results  Verlene Mayer, [name], BCPS Pager [phone] 12/21/2011,2:05 PM

## 2011-12-21 NOTE — Discharge Summary (Signed)
Physician Discharge Summary  Patient ID: [name] MRN: [id number] DOB/AGE: 03-16-1986 26 y.o.  Admit date: 12/18/2011 Discharge date: 12/22/2011           Admission Diagnoses: Left thumb abscess Past Medical History  Diagnosis Date  . Diabetes mellitus   . Depression     Discharge Diagnoses:  Active Problems:  * No active hospital problems. *    Surgeries: Procedure(s): INCISION AND DRAINAGE ABSCESS on 12/18/2011 - 12/19/2011    Consultants:  none  Discharged Condition: Improved  Hospital Course: Gibson Lad is an 26 y.o. male who was admitted 12/18/2011 with a chief complaint of  Chief Complaint  Patient presents with  . Wound Infection  , and found to have a diagnosis of Left thumb abscess.  They were brought to the operating room on 12/18/2011 - 12/19/2011 and underwent Procedure(s): INCISION AND DRAINAGE ABSCESS.    They were given perioperative antibiotics: Anti-infectives     Start     Dose/Rate Route Frequency Ordered Stop   12/21/11 1445   vancomycin (VANCOCIN) 1,250 mg in sodium chloride 0.9 % 250 mL IVPB        1,250 mg 166.7 mL/hr over 90 Minutes Intravenous Every 8 hours 12/21/11 1409     12/19/11 0945   vancomycin (VANCOCIN) IVPB 1000 mg/200 mL premix  Status:  Discontinued        1,000 mg 200 mL/hr over 60 Minutes Intravenous Every 12 hours 12/19/11 0944 12/19/11 1027   12/19/11 0800   vancomycin (VANCOCIN) IVPB 1000 mg/200 mL premix  Status:  Discontinued        1,000 mg 200 mL/hr over 60 Minutes Intravenous Every 8 hours 12/19/11 0006 12/21/11 1408   12/19/11 0200   vancomycin (VANCOCIN) IVPB 1000 mg/200 mL premix  Status:  Discontinued        1,000 mg 200 mL/hr over 60 Minutes Intravenous 60 min pre-op 12/19/11 0149 12/19/11 0209   12/19/11 0015   vancomycin (VANCOCIN) IVPB 1000 mg/200 mL premix        1,000 mg 200 mL/hr over 60 Minutes Intra[facility]ous [facility] 12/19/11 0005 12/19/11 0735        .  They were given  sequential compression devices, early ambulation, and Other (comment)no indication for DVT prophylaxis.  Recent vital signs: Patient Vitals for the past 24 hrs:  BP Temp Pulse Resp SpO2  12/21/11 1400 121/77 mmHg 98.9 F (37.2 C) 83  16  98 %  12/21/11 0458 111/67 mmHg 98.1 F (36.7 C) 86  18  99 %  12/20/11 1943 117/67 mmHg 98.3 F (36.8 C) 92  18  100 %  .  Recent laboratory studies: No results found.  Discharge Medications:  Current Discharge Medication List    CONTINUE these medications which have NOT CHANGED   Details  doxycycline (VIBRAMYCIN) 100 MG capsule Take 100 mg by mouth 2 (two) times daily.      ibuprofen (ADVIL,MOTRIN) 800 MG tablet Take 800 mg by mouth 3 (three) times daily.      insulin glargine (LANTUS) 100 UNIT/ML injection Inject 25 Units into the skin at bedtime.      insulin lispro (HUMALOG) 100 UNIT/ML injection Inject 10 Units into the skin 3 (three) times daily before meals.      traMADol (ULTRAM) 50 MG tablet Take 50 mg by mouth every 6 (six) hours as needed. For pain. Maximum dose= 8 tablets per day         Diagnostic Studies: Dg Skull  1-3 Views  12/13/2011  *RADIOLOGY REPORT*  Clinical Data: Laceration to the occiput; the patient works with metal.  Assess for metal embedded within the soft tissues.  SKULL - 1-3 VIEW  Comparison: CT of the head performed 08/19/2011  Findings: No radiopaque foreign bodies are identified.  The calvarium is grossly unremarkable in appearance.  There is no evidence of fracture.  The visualized paranasal sinuses and mastoid air cells are well-aerated.  Diffuse dental fillings are noted.  No significant soft tissue abnormalities are characterized on radiograph.  IMPRESSION: No radiopaque foreign bodies seen.  Original Report Authenticated By: Tonia Ghent, M.D.   Dg Hand Complete Left  12/18/2011  *RADIOLOGY REPORT*  Clinical Data: Left thumb wound.  Prior laceration.  Erythema. Diabetes.  LEFT HAND - COMPLETE 3+ VIEW   Comparison: None.  Findings: No fracture, foreign body, or acute bony findings are identified.  No findings of osteomyelitis or gas in the soft tissues.  IMPRESSION: 1.  No fracture, foreign body, findings of osteomyelitis, or gas in the soft tissues.  Original Report Authenticated By: Dellia Cloud, M.D.    They benefited maximally from their hospital stay and there were no complications.     Disposition: Home or Self Care  Follow-up Information    Follow up with Arne Cleveland information:   [facility] [address] [place] [address] [phone]           Signed: Sharma Covert 12/21/2011, 6:05 PM

## 2011-12-21 NOTE — Progress Notes (Signed)
Inpatient Diabetes Program Recommendations  AACE/ADA: New Consensus Statement on Inpatient Glycemic Control (2009)  Target Ranges:  Prepandial:   less than 140 mg/dL      Peak postprandial:   less than 180 mg/dL (1-2 hours)      Critically ill patients:  140 - 180 mg/dL   Inpatient Diabetes Program Recommendations Correction (SSI): change to TID correction from q4 since patient is eating now  Spoke with patient concerning using insulin pen vs. Insulin vial and syringe.  Patient actually prefers the insulin pen but has had issues with getting them and affording the needles.  We called [facility] and they do provide insulin pens and needles.   Patient said he has been seeing "flashes" in his eyes disrupting his vision.  Reports his last eye exam was approximately 5 years ago.  Informed patient that standard of care is to have an eye exam yearly.  Will continue to follow during this admission.  Thanks

## 2011-12-22 LAB — GLUCOSE, CAPILLARY
Glucose-Capillary: 310 mg/dL — ABNORMAL HIGH (ref 70–99)
Glucose-Capillary: 157 mg/dL — ABNORMAL HIGH (ref 70–99)
Glucose-Capillary: 123 mg/dL — ABNORMAL HIGH (ref 70–99)
Glucose-Capillary: 224 mg/dL — ABNORMAL HIGH (ref 70–99)
Glucose-Capillary: 307 mg/dL — ABNORMAL HIGH (ref 70–99)

## 2011-12-22 LAB — CULTURE, ROUTINE-ABSCESS: Gram Stain: NONE SEEN

## 2011-12-22 MED ORDER — TEMAZEPAM 15 MG PO CAPS: 15.0000 mg | ORAL_CAPSULE | Freq: Every evening | ORAL | Status: DC | PRN

## 2011-12-22 NOTE — Progress Notes (Signed)
Occupational Therapy Evaluation Patient Details Name: Nethan Caudillo MRN: [id number] DOB: 02/27/1986 Today's Date: 12/22/2011  Pain: 3/10. Received pain meds prior to therapy. Given ice pack.  Problem List: Strength, ROM   Past Medical History:  Past Medical History  Diagnosis Date  . Diabetes mellitus   . Depression    Past Surgical History:  Past Surgical History  Procedure Date  . No past surgeries   . I&d extremity 12/19/2011    Procedure: IRRIGATION AND DEBRIDEMENT EXTREMITY;  Surgeon: Sharma Covert;  Location: [facility] OR;  Service: Orthopedics;  Laterality: Left;    OT Assessment/Plan/Recommendation OT Assessment Clinical Impression Statement: Pt completing ADL at indep/mod i level. All education completed. OT Recommendation/Assessment: Patient does not need any further OT services (Further needs to be assessed at next MD visit.)   Prior Functioning Home Living Lives With: Friend(s) Receives Help From: Friend(s) Type of Home: Apartment Home Layout: One level Bathroom Shower/Tub: Engineer, manufacturing systems: Standard Bathroom Accessibility: Yes How Accessible: Accessible via walker Home Adaptive Equipment: None Prior Function Level of Independence: Independent with basic ADLs;Independent with homemaking with ambulation Driving: Yes Vocation: Full time employment ADL ADL Eating/Feeding: Independent Where Assessed - Eating/Feeding: Edge of bed Grooming: Brushing hair;Wash/dry hands;Wash/dry face;Performed Where Assessed - Grooming: Standing at sink Upper Body Bathing: Simulated;Independent Where Assessed - Upper Body Bathing: Standing at sink Lower Body Bathing: Simulated;Independent Where Assessed - Lower Body Bathing: Standing at sink Upper Body Dressing: Simulated;Independent Where Assessed - Upper Body Dressing: Sit to stand from bed Lower Body Dressing: Independent Where Assessed - Lower Body Dressing: Sit to stand from bed Toilet Transfer:  Independent Toilet Transfer Method: Ambulating Toileting - Clothing Manipulation: Simulated Where Assessed - Glass blower/designer Manipulation: Standing Toileting - Hygiene: Independent Where Assessed - Toileting Hygiene: Standing Tub/Shower Transfer: Not assessed Tub/Shower Transfer Method: Not assessed Vision/Perception  Vision - History Baseline Vision: No visual deficits Cognition Cognition Arousal/Alertness: Awake/alert Overall Cognitive Status: Appears within functional limits for tasks assessed Orientation Level: Oriented X4 Sensation/Coordination Sensation Additional Comments: thumb not tested due to bandage. Coordination Gross Motor Movements are Fluid and Coordinated: Yes Fine Motor Movements are Fluid and Coordinated: No (due to dressings. pt adapting to dressings.) Extremity Assessment RUE Assessment RUE Assessment: Within Functional Limits LUE Assessment LUE Assessment:  (WFL with the exception of thumb/wrist) Mobility  Bed Mobility Bed Mobility: Yes - independent Transfers Transfers: Yes - independent  Skilled intervention: Exercises Hand Exercises Digit Composite Flexion: AROM Digit Lifts: AROM Other Exercises Other Exercises: Composite flexion/extension. Increasing MP flexion with gentle passive stretch. Educated on importance of keeping LUE elevated. End of Session OT - End of Session Activity Tolerance: Patient tolerated treatment well Patient left: in chair General Behavior During Session: [facility] for tasks performed Cognition: [facility] for tasks performed  Evaluation only. No goals established.   Laurren Lepkowski,HILLARY 12/22/2011, 1:15 PM

## 2011-12-24 LAB — ANAEROBIC CULTURE

## 2012-02-17 ENCOUNTER — Inpatient Hospital Stay (HOSPITAL_COMMUNITY)
Admission: RE | Admit: 2012-02-17 | Discharge: 2012-02-22 | DRG: 885 | Disposition: A | Discharge status: Home / Self Care | Payer: PRIVATE HEALTH INSURANCE | Source: Ambulatory Visit | Attending: Psychiatry | Admitting: Psychiatry

## 2012-02-17 ENCOUNTER — Emergency Department (HOSPITAL_COMMUNITY)
Admission: EM | Admit: 2012-02-17 | Discharge: 2012-02-17 | Disposition: A | Discharge status: Other Acute Inpatient Hospital | Payer: Medicare Other | Attending: Emergency Medicine | Admitting: Emergency Medicine

## 2012-02-17 ENCOUNTER — Encounter (HOSPITAL_COMMUNITY): Payer: Self-pay | Admitting: *Deleted

## 2012-02-17 DIAGNOSIS — F329 Major depressive disorder, single episode, unspecified: Secondary | ICD-10-CM

## 2012-02-17 DIAGNOSIS — F259 Schizoaffective disorder, unspecified: Principal | ICD-10-CM

## 2012-02-17 DIAGNOSIS — F3289 Other specified depressive episodes: Secondary | ICD-10-CM

## 2012-02-17 DIAGNOSIS — Z794 Long term (current) use of insulin: Secondary | ICD-10-CM

## 2012-02-17 DIAGNOSIS — R440 Auditory hallucinations: Secondary | ICD-10-CM

## 2012-02-17 DIAGNOSIS — R443 Hallucinations, unspecified: Secondary | ICD-10-CM | POA: Insufficient documentation

## 2012-02-17 DIAGNOSIS — IMO0002 Reserved for concepts with insufficient information to code with codable children: Secondary | ICD-10-CM | POA: Diagnosis present

## 2012-02-17 DIAGNOSIS — F29 Unspecified psychosis not due to a substance or known physiological condition: Secondary | ICD-10-CM

## 2012-02-17 DIAGNOSIS — Z882 Allergy status to sulfonamides status: Secondary | ICD-10-CM

## 2012-02-17 DIAGNOSIS — Z79899 Other long term (current) drug therapy: Secondary | ICD-10-CM

## 2012-02-17 DIAGNOSIS — R441 Visual hallucinations: Secondary | ICD-10-CM

## 2012-02-17 DIAGNOSIS — F17200 Nicotine dependence, unspecified, uncomplicated: Secondary | ICD-10-CM

## 2012-02-17 DIAGNOSIS — F203 Undifferentiated schizophrenia: Secondary | ICD-10-CM | POA: Diagnosis present

## 2012-02-17 DIAGNOSIS — F411 Generalized anxiety disorder: Secondary | ICD-10-CM | POA: Diagnosis present

## 2012-02-17 DIAGNOSIS — E1065 Type 1 diabetes mellitus with hyperglycemia: Secondary | ICD-10-CM

## 2012-02-17 HISTORY — DX: Schizoaffective disorder, bipolar type: F25.0

## 2012-02-17 HISTORY — DX: Schizoaffective disorder, unspecified: F25.9

## 2012-02-17 LAB — URINE MICROSCOPIC-ADD ON

## 2012-02-17 LAB — CBC
HCT: 41.7 % (ref 39.0–52.0)
Platelets: 284 10*3/uL (ref 150–400)
WBC: 7.1 10*3/uL (ref 4.0–10.5)

## 2012-02-17 LAB — URINALYSIS, ROUTINE W REFLEX MICROSCOPIC
Bilirubin Urine: NEGATIVE
Glucose, UA: >1000 mg/dL — AB
Hgb urine dipstick: NEGATIVE
Nitrite: NEGATIVE
Specific Gravity, Urine: >1.046 — ABNORMAL HIGH (ref 1.005–1.030)
pH: 6.5 (ref 5.0–8.0)

## 2012-02-17 LAB — COMPREHENSIVE METABOLIC PANEL
Albumin: 4.3 g/dL (ref 3.5–5.2)
BUN: 11 mg/dL (ref 6–23)
Chloride: 95 mEq/L — ABNORMAL LOW (ref 96–112)
GFR calc Af Amer: >90 mL/min (ref >90)
GFR calc non Af Amer: >90 mL/min (ref >90)
Potassium: 3.7 mEq/L (ref 3.5–5.1)
Sodium: 134 mEq/L — ABNORMAL LOW (ref 135–145)
Total Protein: 6.7 g/dL (ref 6.0–8.3)

## 2012-02-17 LAB — RAPID URINE DRUG SCREEN, HOSP PERFORMED
Cocaine: NOT DETECTED
Opiates: NOT DETECTED

## 2012-02-17 LAB — GLUCOSE, CAPILLARY
Glucose-Capillary: 414 mg/dL — ABNORMAL HIGH (ref 70–99)
Glucose-Capillary: 210 mg/dL — ABNORMAL HIGH (ref 70–99)

## 2012-02-17 LAB — ETHANOL: Alcohol, Ethyl (B): <11 mg/dL (ref 0–11)

## 2012-02-17 MED ORDER — DOXYCYCLINE HYCLATE 100 MG PO TABS
100.0000 mg | ORAL_TABLET | Freq: Two times a day (BID) | ORAL | Status: DC
  Administered 2012-02-17: 100 mg via ORAL
  Filled 2012-02-17: qty 1

## 2012-02-17 MED ORDER — INSULIN ASPART 100 UNIT/ML ~~LOC~~ SOLN
0.0000 [IU] | Freq: Three times a day (TID) | SUBCUTANEOUS | Status: DC
  Filled 2012-02-17: qty 3

## 2012-02-17 MED ORDER — INSULIN ASPART 100 UNIT/ML ~~LOC~~ SOLN
10.0000 [IU] | Freq: Three times a day (TID) | SUBCUTANEOUS | Status: DC
  Filled 2012-02-17: qty 3

## 2012-02-17 MED ORDER — ZOLPIDEM TARTRATE 5 MG PO TABS: 5.0000 mg | ORAL_TABLET | Freq: Every evening | ORAL | Status: DC | PRN

## 2012-02-17 MED ORDER — ALUM & MAG HYDROXIDE-SIMETH 200-200-20 MG/5ML PO SUSP: 30.0000 mL | ORAL | Status: DC | PRN

## 2012-02-17 MED ORDER — ONDANSETRON HCL 4 MG PO TABS: 4.0000 mg | ORAL_TABLET | Freq: Three times a day (TID) | ORAL | Status: DC | PRN

## 2012-02-17 MED ORDER — DOXYCYCLINE HYCLATE 100 MG PO TABS
200.0000 mg | ORAL_TABLET | Freq: Two times a day (BID) | ORAL | Status: DC
  Administered 2012-02-17 – 2012-02-21 (×9): 200 mg via ORAL
  Filled 2012-02-17 (×13): qty 2

## 2012-02-17 MED ORDER — ACETAMINOPHEN 325 MG PO TABS
650.0000 mg | ORAL_TABLET | Freq: Four times a day (QID) | ORAL | Status: DC | PRN
  Administered 2012-02-17 – 2012-02-18 (×2): 650 mg via ORAL

## 2012-02-17 MED ORDER — FLUPHENAZINE HCL 2.5 MG PO TABS
2.5000 mg | ORAL_TABLET | Freq: Every day | ORAL | Status: DC
  Filled 2012-02-17 (×2): qty 1

## 2012-02-17 MED ORDER — INSULIN ASPART 100 UNIT/ML ~~LOC~~ SOLN
10.0000 [IU] | Freq: Three times a day (TID) | SUBCUTANEOUS | Status: DC
  Administered 2012-02-18 – 2012-02-19 (×5): 10 [IU] via SUBCUTANEOUS
  Filled 2012-02-17: qty 3

## 2012-02-17 MED ORDER — INSULIN GLARGINE 100 UNIT/ML ~~LOC~~ SOLN
25.0000 [IU] | Freq: Every day | SUBCUTANEOUS | Status: DC
  Administered 2012-02-17 – 2012-02-18 (×2): 25 [IU] via SUBCUTANEOUS

## 2012-02-17 MED ORDER — INSULIN ASPART 100 UNIT/ML ~~LOC~~ SOLN
0.0000 [IU] | Freq: Three times a day (TID) | SUBCUTANEOUS | Status: DC
  Administered 2012-02-17: 15 [IU] via SUBCUTANEOUS
  Filled 2012-02-17: qty 3

## 2012-02-17 MED ORDER — ACETAMINOPHEN 325 MG PO TABS: 650.0000 mg | ORAL_TABLET | ORAL | Status: DC | PRN

## 2012-02-17 MED ORDER — HYDROCODONE-ACETAMINOPHEN 5-325 MG PO TABS
1.0000 | ORAL_TABLET | Freq: Four times a day (QID) | ORAL | Status: DC | PRN
  Administered 2012-02-17: 1 via ORAL
  Filled 2012-02-17: qty 1

## 2012-02-17 MED ORDER — TRAZODONE HCL 100 MG PO TABS: 200.0000 mg | ORAL_TABLET | Freq: Every day | ORAL | Status: DC

## 2012-02-17 MED ORDER — TRAZODONE HCL 100 MG PO TABS
200.0000 mg | ORAL_TABLET | Freq: Every day | ORAL | Status: DC
  Administered 2012-02-17 – 2012-02-21 (×5): 200 mg via ORAL
  Filled 2012-02-17 (×6): qty 2

## 2012-02-17 MED ORDER — QUETIAPINE FUMARATE 50 MG PO TABS
50.0000 mg | ORAL_TABLET | Freq: Once | ORAL | Status: AC
  Administered 2012-02-17: 50 mg via ORAL
  Filled 2012-02-17: qty 1

## 2012-02-17 MED ORDER — LORAZEPAM 1 MG PO TABS
1.0000 mg | ORAL_TABLET | ORAL | Status: DC | PRN
  Administered 2012-02-17: 1 mg via ORAL
  Filled 2012-02-17: qty 1

## 2012-02-17 MED ORDER — HYDROXYZINE HCL 25 MG PO TABS
25.0000 mg | ORAL_TABLET | Freq: Four times a day (QID) | ORAL | Status: DC | PRN
  Administered 2012-02-18 – 2012-02-21 (×8): 25 mg via ORAL
  Filled 2012-02-17 (×4): qty 1

## 2012-02-17 MED ORDER — BENZTROPINE MESYLATE 1 MG PO TABS: 1.0000 mg | ORAL_TABLET | Freq: Two times a day (BID) | ORAL | Status: DC

## 2012-02-17 MED ORDER — MAGNESIUM HYDROXIDE 400 MG/5ML PO SUSP: 30.0000 mL | Freq: Every day | ORAL | Status: DC | PRN

## 2012-02-17 MED ORDER — NICOTINE 21 MG/24HR TD PT24
21.0000 mg | MEDICATED_PATCH | Freq: Every day | TRANSDERMAL | Status: DC
  Administered 2012-02-17: 21 mg via TRANSDERMAL
  Filled 2012-02-17: qty 1

## 2012-02-17 MED ORDER — INSULIN GLARGINE 100 UNIT/ML ~~LOC~~ SOLN
25.0000 [IU] | Freq: Every day | SUBCUTANEOUS | Status: DC
  Filled 2012-02-17: qty 3

## 2012-02-17 MED ORDER — CITALOPRAM HYDROBROMIDE 20 MG PO TABS
20.0000 mg | ORAL_TABLET | Freq: Every day | ORAL | Status: DC
  Filled 2012-02-17 (×2): qty 1

## 2012-02-17 MED ORDER — CLONAZEPAM 1 MG PO TABS
1.0000 mg | ORAL_TABLET | Freq: Once | ORAL | Status: AC
  Administered 2012-02-17: 1 mg via ORAL
  Filled 2012-02-17: qty 1

## 2012-02-17 MED ORDER — IBUPROFEN 600 MG PO TABS
600.0000 mg | ORAL_TABLET | Freq: Three times a day (TID) | ORAL | Status: DC | PRN
  Administered 2012-02-17: 600 mg via ORAL
  Filled 2012-02-17: qty 1

## 2012-02-17 NOTE — BH Assessment (Signed)
Pt accepted to [facility]. By Dr. Fredda Hammed to Dr. Allena Katz bed 404-1. EDP and RN notified. All support paperwork signed and faxed to [facility].

## 2012-02-17 NOTE — ED Notes (Signed)
Resting quietly, nad.  Pt reports no relief w/ pain med and is requesting additional

## 2012-02-17 NOTE — ED Notes (Signed)
telepsych complete 

## 2012-02-17 NOTE — Tx Team (Addendum)
Initial Interdisciplinary Treatment Plan  PATIENT STRENGTHS: (choose at least two) Active sense of humor Average or above average intelligence Communication skills Motivation for treatment/growth  PATIENT STRESSORS: Financial difficulties Health problems Substance abuse   PROBLEM LIST: Problem List/Patient Goals Date to be addressed Date deferred Reason deferred Estimated date of resolution  Psychosis 02/17/12     Depression 02/17/12                                                DISCHARGE CRITERIA:  Adequate post-discharge living arrangements Improved stabilization in mood, thinking, and/or behavior Motivation to continue treatment in a less acute level of care Reduction of life-threatening or endangering symptoms to within safe limits Verbal commitment to aftercare and medication compliance  PRELIMINARY DISCHARGE PLAN: Outpatient therapy Placement in alternative living arrangements  PATIENT/FAMIILY INVOLVEMENT: This treatment plan has been presented to and reviewed with the patient, [name].  The patient and family have been given the opportunity to ask questions and make suggestions.  Juliann Pares 02/17/2012, 11:01 PM

## 2012-02-17 NOTE — ED Notes (Signed)
approx 3" healing laceration noted rt thumb (surgical) 1cm scab noted mid lac.  No drainage noted.  Pt reports that he had MRSA at the site and it had to be surgically drained.

## 2012-02-17 NOTE — ED Notes (Signed)
Pt became choked  While eating lunch.  Pt was able to clear airway, breath sounds CTA  Bilaterally.  Pt finish lunch following episode

## 2012-02-17 NOTE — ED Provider Notes (Signed)
History     CSN: [id number]  Arrival date & time 02/17/12  1047   First MD Initiated Contact with Patient 02/17/12 1135      12:20 PM HPI Patient reports yesterday began having visual auditory hallucinations. Hallucinations included seeing people that there, and got caught on his arms. Reports similar symptoms in the past 2 to schizo effective disorder. Denies suicidal ideation or homicidal ideation. States "I feel very unsafe". Was brought to the ED by his counsellor, Court.  Patient is a 26 y.o. male presenting with mental health disorder. The history is provided by the patient.  Mental Health Problem The primary symptoms include hallucinations. The current episode started yesterday. This is a recurrent problem.  He has auditory and visual hallucinations.  The degree of incapacity that he is experiencing as a consequence of his illness is mild. Additional symptoms of the illness include agitation, flight of ideas and distractible. Additional symptoms of the illness do not include no abdominal pain. He does not admit to suicidal ideas. He does not have a plan to commit suicide. He does not contemplate harming himself. He has not already injured self. He does not contemplate injuring another person. He has not already  injured another person. Risk factors that are present for mental illness include a history of mental illness.    Past Medical History  Diagnosis Date  . Schizo affective schizophrenia     History reviewed. No pertinent past surgical history.  History reviewed. No pertinent family history.  History  Substance Use Topics  . Smoking status: Not on file  . Smokeless tobacco: Not on file  . Alcohol Use:       Review of Systems  Constitutional: Negative for fever and chills.  Respiratory: Negative for cough.   Cardiovascular: Negative for chest pain.  Gastrointestinal: Negative for abdominal pain.  Skin: Positive for wound.  Psychiatric/Behavioral: Positive for  hallucinations, decreased concentration and agitation. Negative for suicidal ideas and self-injury. The patient is not nervous/anxious.   All other systems reviewed and are negative.    Allergies  Sulfa antibiotics  Home Medications  No current outpatient prescriptions on file.  BP 91/59  Pulse 111  Temp(Src) 97.8 F (36.6 C) (Oral)  Resp 20  SpO2 95%  Physical Exam  Vitals reviewed. Constitutional: He is oriented to person, place, and time. He appears well-developed and well-nourished.  HENT:  Head: Normocephalic and atraumatic.  Eyes: Pupils are equal, round, and reactive to light.  Neurological: He is alert and oriented to person, place, and time.  Skin: Skin is warm and dry. No rash noted. No erythema. No pallor.  Psychiatric: He has a normal mood and affect. His behavior is normal. Thought content normal.    ED Course  Procedures  Results for orders placed during the hospital encounter of 02/17/12  CBC      Component Value Range   WBC 7.1  4.0 - 10.5 (K/uL)   RBC 4.77  4.22 - 5.81 (MIL/uL)   Hemoglobin 14.8  13.0 - 17.0 (g/dL)   HCT 82.9  56.2 - 13.0 (%)   MCV 87.4  78.0 - 100.0 (fL)   MCH 31.0  26.0 - 34.0 (pg)   MCHC 35.5  30.0 - 36.0 (g/dL)   RDW NOT CALCULATED  11.5 - 15.5 (%)   Platelets 284  150 - 400 (K/uL)  COMPREHENSIVE METABOLIC PANEL      Component Value Range   Sodium 134 (*) 135 - 145 (mEq/L)   Potassium  3.7  3.5 - 5.1 (mEq/L)   Chloride 95 (*) 96 - 112 (mEq/L)   CO2 27  19 - 32 (mEq/L)   Glucose, Bld 290 (*) 70 - 99 (mg/dL)   BUN 11  6 - 23 (mg/dL)   Creatinine, Ser 1.61 (*) 0.50 - 1.35 (mg/dL)   Calcium 9.6  8.4 - 09.6 (mg/dL)   Total Protein 6.7  6.0 - 8.3 (g/dL)   Albumin 4.3  3.5 - 5.2 (g/dL)   AST 14  0 - 37 (U/L)   ALT 17  0 - 53 (U/L)   Alkaline Phosphatase 69  39 - 117 (U/L)   Total Bilirubin 0.7  0.3 - 1.2 (mg/dL)   GFR calc non Af Amer >90  >90 (mL/min)   GFR calc Af Amer >90  >90 (mL/min)  ETHANOL      Component Value Range     Alcohol, Ethyl (B) <11  0 - 11 (mg/dL)     MDM  Patient also reports a recent history of MRSA. States he was given IV antibiotics for 7 days by Dr. Thomas Hoff. Reports he was given a prescription to take home but only took 2 days worth. Denies change in left thumb wound. Reports it appears to be improving.   2:32 PM Spoke with Chevis Pretty, ACT who will see the patient.         Thomasene Lot, PA-C 02/17/12 1432

## 2012-02-17 NOTE — ED Notes (Signed)
Pt is aware that we need a urine sample-collection cup given

## 2012-02-17 NOTE — ED Notes (Signed)
Pt reports  He takes 10 units of novolog before meals w/ sliding scale and 25 units of lantus at night
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
## 2012-02-17 NOTE — ED Notes (Signed)
Pt now reports that he takes 10 units novolog  Before meals if his blood sugars are normal and if they (CBG) are abnormal he uses sliding scale--Bridgette PA aware

## 2012-02-17 NOTE — ED Notes (Signed)
Note--pat. eatting sandwich on arrival to unit, then ate lunch tray and sandwich w/ soda prior to notify staff he is diabetic.  Lennette Bihari PA aware of CBG-no additional orders recieved

## 2012-02-17 NOTE — ED Notes (Signed)
Pt now reports that he is diabetic and normally takes novolog and lantus-has been off of his meds x3 weeks.  Pt reports recent wt loss, increased appetite, and thirst.

## 2012-02-17 NOTE — ED Notes (Signed)
Pt in c/o auditory and visual hallucinations since yesterday, states he is compliant with his medication, denies ETOH or drug use, denies SI/HI

## 2012-02-17 NOTE — ED Notes (Signed)
Report given to [facility] admission Rn at [facility] reports to send pt in 30 min

## 2012-02-17 NOTE — Progress Notes (Signed)
Pt is 26 year old admitted to the services of Dr. Allena Katz for treatment of auditory and visual hallucinations.  Pt states they are not command in nature.  Pt states that he has had these before and when they returned he became concerned.  Pt states that he has not been using illicit drugs other than THC.  Pt states that he currently is living with friends and that he feels that he may be in danger with them, he does not trust them.  Pt has MRSA in his left thumb and was ordered Doxycycline for this.  Pt was appropriate and pleasant during admission.  Pt lists his friend Nettie Elm as his emergency contact [phone]

## 2012-02-17 NOTE — ED Notes (Signed)
Sleeping soundly 

## 2012-02-17 NOTE — ED Notes (Signed)
[phone] for father

## 2012-02-17 NOTE — ED Notes (Signed)
ACT-bobby-into see 

## 2012-02-18 DIAGNOSIS — F29 Unspecified psychosis not due to a substance or known physiological condition: Secondary | ICD-10-CM

## 2012-02-18 DIAGNOSIS — F203 Undifferentiated schizophrenia: Secondary | ICD-10-CM | POA: Diagnosis present

## 2012-02-18 LAB — URINALYSIS, ROUTINE W REFLEX MICROSCOPIC
Bilirubin Urine: NEGATIVE
Glucose, UA: 250 mg/dL — AB
Hgb urine dipstick: NEGATIVE
Ketones, ur: NEGATIVE mg/dL
Protein, ur: NEGATIVE mg/dL
Urobilinogen, UA: 0.2 mg/dL (ref 0.0–1.0)

## 2012-02-18 LAB — GLUCOSE, CAPILLARY
Glucose-Capillary: 391 mg/dL — ABNORMAL HIGH (ref 70–99)
Glucose-Capillary: 288 mg/dL — ABNORMAL HIGH (ref 70–99)

## 2012-02-18 MED ORDER — FLUPHENAZINE HCL 5 MG PO TABS: 5.0000 mg | ORAL_TABLET | Freq: Two times a day (BID) | ORAL | Status: DC

## 2012-02-18 MED ORDER — QUETIAPINE FUMARATE 100 MG PO TABS
100.0000 mg | ORAL_TABLET | Freq: Every day | ORAL | Status: DC
  Administered 2012-02-18 – 2012-02-20 (×3): 100 mg via ORAL
  Filled 2012-02-18 (×5): qty 1

## 2012-02-18 MED ORDER — IBUPROFEN 600 MG PO TABS
600.0000 mg | ORAL_TABLET | Freq: Four times a day (QID) | ORAL | Status: DC | PRN
  Administered 2012-02-18 – 2012-02-19 (×3): 600 mg via ORAL
  Filled 2012-02-18 (×3): qty 1

## 2012-02-18 MED ORDER — NICOTINE 21 MG/24HR TD PT24
21.0000 mg | MEDICATED_PATCH | Freq: Every day | TRANSDERMAL | Status: DC
  Administered 2012-02-18 – 2012-02-22 (×6): 21 mg via TRANSDERMAL
  Filled 2012-02-18 (×6): qty 1

## 2012-02-18 MED ORDER — QUETIAPINE FUMARATE 50 MG PO TABS
50.0000 mg | ORAL_TABLET | Freq: Two times a day (BID) | ORAL | Status: DC | PRN
  Administered 2012-02-18 – 2012-02-19 (×3): 50 mg via ORAL
  Filled 2012-02-18 (×3): qty 1

## 2012-02-18 NOTE — Progress Notes (Signed)
Patient ID: [name], male   DOB: 10/21/1986, 26 y.o.   MRN: [id number]  [facility] Group Notes:  (Counselor/Nursing/MHT/Case Management/Adjunct)  02/18/2012 11 AM  Type of Therapy:  Group Therapy, Dance/Movement Therapy   Participation Level:  Active  Participation Quality:  Appropriate  Affect:  Appropriate  Cognitive:  Oriented  Insight:  Good  Engagement in Group:  Good  Engagement in Therapy:  Good  Modes of Intervention:  Clarification, Problem-solving, Role-play, Socialization and Support  Summary of Progress/Problems:  Therapist discussed the definition of supports.  Therapist asked group to describe their idea of support.  Patient stated that support means, " eating a good meal".     Rhunette Croft

## 2012-02-18 NOTE — H&P (Signed)
  Pt was seen by me today and I agree with the key elements documented in H&P.  

## 2012-02-18 NOTE — H&P (Signed)
Psychiatric Admission Assessment Adult  Patient Identification:  [name] Date of Evaluation:  02/18/2012 Chief Complaint:  SCHIZOAFFECTIVE D/O  History of Present Illness:: This is a 26 year old Caucasian male, admitted to [name] from the [facility] ED with complaints of both auditory, tactile and visual hallucinations. Patient reports, "My symptoms are acting up on me. I am hearing voices and I see bugs crawling all over me for the past 2 days. I had these symptoms in 2007. I know that the voices and bugs that I hear and see craw all over me are not real, but I see and hear them any way. It is distracting as well distressing. I have been to a lot of other psychiatric hospitals in the past, but the one that I remember most is the [facility]. I was there for a while.  I remember some of the medicines that I was on in the past, they are, prolixin, Cogentin, Cymbalta, Lantus and Novolog insulin. I am diabetic. It started when I was 21. I had MRSA infection to my Lt. Thumb this past January. I had a small reddened spot to my left thumb that got infected. It turned out to be MRSA. It was surgically opened and drained on the first of January 2013. I was on a course of antibiotics that I had completed. "  Mood Symptoms:  Sadness, Depression Symptoms:  depressed mood, anxiety, (Hypo) Manic Symptoms:  Distractibility, Irritable Mood, Anxiety Symptoms:  Excessive Worry, Psychotic Symptoms:  Hallucinations: Auditory Tactile Visual  PTSD Symptoms: Had a traumatic exposure:  None reported  Past Psychiatric History: Diagnosis:  Hospitalizations:  Outpatient Care:  Substance Abuse Care:  Self-Mutilation:  Suicidal Attempts:  Violent Behaviors:   Past Medical History:   Past Medical History  Diagnosis Date  . Diabetes mellitus   . Depression    Loss of Consciousness:  None reported  Seizure History:  None reported Cardiac History:  None reported Traumatic Brain Injury:  None  reported Allergies:   Allergies  Allergen Reactions  . Sulfa Antibiotics Other (See Comments)    unknown   PTA Medications: Prescriptions prior to admission  Medication Sig Dispense Refill  . benztropine (COGENTIN) 1 MG tablet Take 1 mg by mouth 2 (two) times daily.      . Citalopram Hydrobromide (CELEXA PO) Take 1 tablet by mouth 2 (two) times daily.      . fluPHENAZine (PROLIXIN) 5 MG tablet Take 5 mg by mouth 2 (two) times daily. Patient not sure of dose      . HYDROcodone-acetaminophen (NORCO) 5-325 MG per tablet Take 2 tablets by mouth every 6 (six) hours as needed.      [name] ibuprofen (ADVIL,MOTRIN) 800 MG tablet Take 800 mg by mouth 3 (three) times daily as needed.      . traZODone (DESYREL) 100 MG tablet Take 200 mg by mouth at bedtime.      . insulin glargine (LANTUS) 100 UNIT/ML injection Inject 25 Units into the skin at bedtime.        . insulin lispro (HUMALOG) 100 UNIT/ML injection Inject 10 Units into the skin 3 (three) times daily before meals.          Previous Psychotropic Medications:  Medication/Dose  See lists above               Substance Abuse History in the last 12 months: Substance Age of 1st Use Last Use Amount Specific Type  Nicotine 21 Prior to hosp 2 packs  daily Cigarettes  Alcohol 14  2 bottles of beer in a week Beer  Cannabis 12 Occasional  Marijuana  Opiates Denies use     Cocaine Denies use     Methamphetamines Denies use     LSD Denies use     Ecstasy Denies use     Benzodiazepines Denies use     Caffeine      Inhalants      Others:                         Consequences of Substance Abuse: Medical Consequences:  Liver damage Legal Consequences:  Arrests, jail time Family Consequences:  Family discord Withdrawal Symptoms:   None  Social History: Current Place of Residence: [place] features [place] of Birth: [place], [place]  Family Members: "I have a brother who lives in [place] Marital Status:  Single Children:  0  Sons:0  Daughters:0 Relationships:"None reported" Education:  GED Educational Problems/Performance:"I completed GED" Religious Beliefs/Practices: None reported History of Abuse (Emotional/Phsycial/Sexual): None reported Occupational Experiences: Employed Hotel manager History:  None. Legal History: None reported Hobbies/Interests: None reported  Family History:  No family history on file.  Mental Status Examination/Evaluation: Objective:  Appearance: Casual, thin and frail looking  Eye Contact::  Good  Speech:  Clear and Coherent  Volume:  Normal  Mood:  Depressed  Affect:  Flat  Thought Process:  Coherent  Orientation:  Full  Thought Content:  Hallucinations: Auditory Tactile Visual  Suicidal Thoughts:  No  Homicidal Thoughts:  No  Memory:  Immediate;   Good Recent;   Fair Remote;   Fair  Judgement:  Fair  Insight:  Fair  Psychomotor Activity:  Normal  Concentration:  Fair  Recall:  Fair  Akathisia:  No  Handed:  Right  AIMS (if indicated):     Assets:  Desire for Improvement  Sleep:  Number of Hours: 6.25     Laboratory/X-Ray Psychological Evaluation(s)      Assessment:    AXIS I:  Psychotic disorder, NOS AXIS II:  Deferred AXIS III:   Past Medical History  Diagnosis Date  . Diabetes mellitus   . Depression    AXIS IV:  other psychosocial or environmental problems, problems related to social environment and problems with primary support group AXIS V:  41-50 serious symptoms  Treatment Plan/Recommendations: Admit for safety and stabilization.                                                                Review and reinstate any pertinent home medications.                                                                Increase Seroquel from 50 mg to 100 mg Q bedtime.  Add Seroquel 50 mg bid PRN for agitations.                                                                Obtain labs.                                                                 Continue current treatment plan.                                       Treatment Plan Summary: Daily contact with patient to assess and evaluate symptoms and progress in treatment Medication management Patient is currently on antibiotics for wound infection (Hx. MRSA).  Current Medications:  Current Facility-Administered Medications  Medication Dose Route Frequency Provider Last Rate Last Dose  . acetaminophen (TYLENOL) tablet 650 mg  650 mg Oral Q6H PRN Wonda Cerise, MD   650 mg at 02/18/12 1036  . alum & mag hydroxide-simeth (MAALOX/MYLANTA) 200-200-20 MG/5ML suspension 30 mL  30 mL Oral Q4H PRN Wonda Cerise, MD      . clonazePAM Scarlette Calico) tablet 1 mg  1 mg Oral Once Wonda Cerise, MD   1 mg at 02/17/12 2243  . doxycycline (VIBRA-TABS) tablet 200 mg  200 mg Oral Q12H Wonda Cerise, MD   200 mg at 02/18/12 1036  . hydrOXYzine (ATARAX/VISTARIL) tablet 25 mg  25 mg Oral Q6H PRN Wonda Cerise, MD   25 mg at 02/18/12 1036  . insulin aspart (novoLOG) injection 10 Units  10 Units Subcutaneous TID WC Wonda Cerise, MD   10 Units at 02/18/12 1206  . insulin glargine (LANTUS) injection 25 Units  25 Units Subcutaneous QHS Wonda Cerise, MD   25 Units at 02/17/12 2215  . magnesium hydroxide (MILK OF MAGNESIA) suspension 30 mL  30 mL Oral Daily PRN Wonda Cerise, MD      . nicotine (NICODERM CQ - dosed in mg/24 hours) patch 21 mg  21 mg Transdermal Q0600 Franchot Gallo, MD   21 mg at 02/18/12 4098  . QUEtiapine (SEROQUEL) tablet 50 mg  50 mg Oral Once Wonda Cerise, MD   50 mg at 02/17/12 2243  . traZODone (DESYREL) tablet 200 mg  200 mg Oral QHS Wonda Cerise, MD   200 mg at 02/17/12 2242    Observation Level/Precautions:  Q 15 minutes checks for safetry  Laboratory:  Reviewed and noted ED lab findings on file.  Psychotherapy:  Group  Medications:  See lists  Routine PRN Medications:  Yes  Consultations:    Discharge Concerns:  Safety  Other:     Armandina Stammer  I 3/3/201312:17 PM

## 2012-02-18 NOTE — ED Provider Notes (Signed)
Medical screening examination/treatment/procedure(s) were performed by non-physician practitioner and as supervising physician I was immediately available for consultation/collaboration.  Gerhard Munch, MD 02/18/12 [phone]

## 2012-02-18 NOTE — Progress Notes (Signed)
Pt has been intrusive  And medication seeking   He attended group with limited participation   He comes frequently to the nurses station requesting narcotic medications  He is mostly is cooperative with treatment   Verbal support given  Medications administered and effectiveness monitored  Q 15 min checks  Pt safe at present
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
## 2012-02-18 NOTE — Progress Notes (Signed)
Patient ID: [name], male   DOB: [birth year], 26 y.o.   MRN: [id number] Pt. attended and participated in aftercare planning group. Pt. accepted information on suicide prevention, warning signs to look for with suicide and crisis line numbers to use. The pt. agreed to call crisis line numbers if having warning signs or having thoughts of suicide. Pt. listed their current anxiety level as 6 and depression as a 6 on a scale of 1-10 with 10 being the highest.

## 2012-02-18 NOTE — Progress Notes (Signed)
[facility] Family/Significant Other Suicide Prevention Education  Suicide Prevention Education:   Patient Refusal for Family/Significant Other Suicide Prevention Education: The patient [name] has refused to provide written consent for family/significant other to be provided Family/Significant Other Suicide Prevention Education during admission and/or prior to discharge.  Physician notified.  Lamar Blinks [facility] 02/18/2012, 2:40 PM

## 2012-02-19 DIAGNOSIS — E1065 Type 1 diabetes mellitus with hyperglycemia: Secondary | ICD-10-CM | POA: Diagnosis present

## 2012-02-19 DIAGNOSIS — F411 Generalized anxiety disorder: Secondary | ICD-10-CM

## 2012-02-19 DIAGNOSIS — F209 Schizophrenia, unspecified: Secondary | ICD-10-CM

## 2012-02-19 LAB — DIFFERENTIAL
Eosinophils Relative: 2 % (ref 0–5)
Lymphocytes Relative: 30 % (ref 12–46)
Lymphs Abs: 2.1 10*3/uL (ref 0.7–4.0)
Monocytes Relative: 8 % (ref 3–12)
Neutrophils Relative %: 60 % (ref 43–77)

## 2012-02-19 LAB — URINE DRUGS OF ABUSE SCREEN W ALC, ROUTINE (REF LAB)
Amphetamine Screen, Ur: NEGATIVE
Benzodiazepines.: NEGATIVE
Cocaine Metabolites: NEGATIVE
Methadone: NEGATIVE
Propoxyphene: NEGATIVE

## 2012-02-19 LAB — RPR: RPR Ser Ql: NONREACTIVE

## 2012-02-19 LAB — CBC
Hemoglobin: 16.1 g/dL (ref 13.0–17.0)
MCV: 87.2 fL (ref 78.0–100.0)
Platelets: 289 10*3/uL (ref 150–400)
RBC: 5.08 MIL/uL (ref 4.22–5.81)
WBC: 7 10*3/uL (ref 4.0–10.5)

## 2012-02-19 LAB — T4, FREE: Free T4: 1.58 ng/dL (ref 0.80–1.80)

## 2012-02-19 LAB — HIV ANTIBODY (ROUTINE TESTING W REFLEX): HIV: NONREACTIVE

## 2012-02-19 LAB — T3, FREE: T3, Free: 2.6 pg/mL (ref 2.3–4.2)

## 2012-02-19 MED ORDER — INSULIN ASPART 100 UNIT/ML ~~LOC~~ SOLN
0.0000 [IU] | Freq: Three times a day (TID) | SUBCUTANEOUS | Status: DC
  Administered 2012-02-19: 7 [IU] via SUBCUTANEOUS
  Administered 2012-02-20: 2 [IU] via SUBCUTANEOUS
  Filled 2012-02-19: qty 3

## 2012-02-19 MED ORDER — FLUPHENAZINE HCL 5 MG PO TABS
5.0000 mg | ORAL_TABLET | ORAL | Status: DC
  Administered 2012-02-19 – 2012-02-22 (×7): 5 mg via ORAL
  Filled 2012-02-19 (×6): qty 1

## 2012-02-19 MED ORDER — BENZTROPINE MESYLATE 1 MG PO TABS
1.0000 mg | ORAL_TABLET | ORAL | Status: DC
  Administered 2012-02-19 – 2012-02-22 (×7): 1 mg via ORAL
  Filled 2012-02-19 (×10): qty 1

## 2012-02-19 MED ORDER — HYDROCODONE-ACETAMINOPHEN 5-325 MG PO TABS: 1.0000 | ORAL_TABLET | ORAL | Status: DC | PRN

## 2012-02-19 MED ORDER — INSULIN NPH (HUMAN) (ISOPHANE) 100 UNIT/ML ~~LOC~~ SUSP
10.0000 [IU] | Freq: Once | SUBCUTANEOUS | Status: AC
  Administered 2012-02-19: 10 [IU] via SUBCUTANEOUS
  Filled 2012-02-19: qty 10

## 2012-02-19 MED ORDER — INSULIN ASPART 100 UNIT/ML ~~LOC~~ SOLN
6.0000 [IU] | Freq: Three times a day (TID) | SUBCUTANEOUS | Status: DC
  Administered 2012-02-19 – 2012-02-20 (×3): 6 [IU] via SUBCUTANEOUS
  Filled 2012-02-19: qty 3

## 2012-02-19 MED ORDER — IBUPROFEN 600 MG PO TABS
600.0000 mg | ORAL_TABLET | Freq: Four times a day (QID) | ORAL | Status: DC | PRN
  Administered 2012-02-19 – 2012-02-21 (×3): 600 mg via ORAL
  Filled 2012-02-19 (×3): qty 1

## 2012-02-19 MED ORDER — TRAMADOL HCL 50 MG PO TABS
50.0000 mg | ORAL_TABLET | Freq: Four times a day (QID) | ORAL | Status: DC | PRN
  Administered 2012-02-19 – 2012-02-20 (×4): 50 mg via ORAL
  Filled 2012-02-19 (×4): qty 1

## 2012-02-19 MED ORDER — SERTRALINE HCL 50 MG PO TABS
50.0000 mg | ORAL_TABLET | Freq: Every day | ORAL | Status: DC
  Administered 2012-02-19 – 2012-02-22 (×4): 50 mg via ORAL
  Filled 2012-02-19 (×5): qty 1

## 2012-02-19 MED ORDER — CLONAZEPAM 0.5 MG PO TABS
0.5000 mg | ORAL_TABLET | ORAL | Status: DC
  Administered 2012-02-19 – 2012-02-20 (×4): 0.5 mg via ORAL
  Filled 2012-02-19 (×4): qty 1

## 2012-02-19 MED ORDER — INSULIN GLARGINE 100 UNIT/ML ~~LOC~~ SOLN
30.0000 [IU] | Freq: Every day | SUBCUTANEOUS | Status: DC
Start: 2012-02-19 — End: 2012-02-20
  Administered 2012-02-19: 30 [IU] via SUBCUTANEOUS

## 2012-02-19 MED ORDER — INSULIN ASPART 100 UNIT/ML ~~LOC~~ SOLN: 0.0000 [IU] | Freq: Three times a day (TID) | SUBCUTANEOUS | Status: DC

## 2012-02-19 NOTE — Progress Notes (Signed)
02/19/2012  Time: 0930   Group Topic/Focus: The focus of this group is on discussing various styles of communication and communicating assertively using 'I' (feeling) statements.   Participation Level:  Minimal  Participation Quality:  Attentive  Affect:  Appropriate  Cognitive:  Oriented  Additional Comments: Patient missed the majority of group as he was with MD. Patient was otherwise observed to be laying in the middle of the floor in the group room.   Braydee Shimkus  02/19/2012 1:16 PM
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
## 2012-02-19 NOTE — Progress Notes (Signed)
Patient ID: [name], male   DOB: [birth year], 26 y.o.   MRN: [id number] Pt reports fair sleep and normal energy level.  He reports his ability to pay attention is poor.  He requested medication for pain and anxiety and for managing the "chirping" he is hearing, saying "I know it is not real"  Pt asking when he will see the MD. On self inventory pt reported seeing people who aren't there, hearing them talking to him and then disappearing.

## 2012-02-19 NOTE — Progress Notes (Signed)
[facility] MD Progress Note  02/19/2012 2:48 PM  Diagnosis:  Axis I: Schizophrenia - Undifferentiated Type.  Generalized Anxiety Disorder.  The patient was seen today and reports the following:   ADL's: Intact.  Sleep: The patient reports to having some difficulty maintaining sleep.  Appetite: The patient reports a good appetite today.   Mild>(1-10) >Severe  Hopelessness (1-10): 9  Depression (1-10): 0  Anxiety (1-10): 9   Suicidal Ideation: The patient denies any suicidal ideations today.  Plan: No  Intent: No  Means: No  Homicidal Ideation: The patient denies any homicidal ideations today.  Plan: No  Intent: No.  Means: No   General Appearance/Behavior: Cooperative and depressed in appearance.  Eye Contact: Good.  Speech: Appropriate in rate and volume with no pressuring noted.  Motor Behavior: wnl.  Level of Consciousness: Alert and Oriented x 3.  Mental Status: Alert and Oriented x 3.  Mood: Severely Depressed in appearance although he rates his depression as a 0. Affect: Moderate to Severely constricted.  Anxiety Level: Severe anxiety reported.  Thought Process: Auditory and visual hallucinations reported.  Thought Content: The patient reports auditory and visual hallucinations but denies any delusional thinking.  Perception:. Auditory and visual hallucinations .  Judgment: Fair.  Insight: Fair.  Cognition: Oriented to time, place and person.  Sleep:  Number of Hours: 6.5    Vital Signs:Blood pressure 104/69, pulse 105, temperature 97.7 F (36.5 C), temperature source Oral, resp. rate 20, height 5' 9.5" (1.765 m), weight 59.875 kg (132 lb).  Current Medications: Current Facility-Administered Medications  Medication Dose Route Frequency Provider Last Rate Last Dose  . alum & mag hydroxide-simeth (MAALOX/MYLANTA) 200-200-20 MG/5ML suspension 30 mL  30 mL Oral Q4H PRN Wonda Cerise, MD      . benztropine (COGENTIN) tablet 1 mg  1 mg Oral BH-qamhs Armonee Bojanowski, MD   1 mg  at 02/19/12 1205  . clonazePAM (KLONOPIN) tablet 0.5 mg  0.5 mg Oral [id number] Angelie Kram, MD   0.5 mg at 02/19/12 1308  . doxycycline (VIBRA-TABS) tablet 200 mg  200 mg Oral Q12H Franchot Gallo, MD   200 mg at 02/19/12 0937  . fluPHENAZine (PROLIXIN) tablet 5 mg  5 mg Oral BH-qamhs Hollie Bartus, MD   5 mg at 02/19/12 1205  . hydrOXYzine (ATARAX/VISTARIL) tablet 25 mg  25 mg Oral Q6H PRN Wonda Cerise, MD   25 mg at 02/19/12 0821  . ibuprofen (ADVIL,MOTRIN) tablet 600 mg  600 mg Oral Q6H PRN Franchot Gallo, MD      . insulin aspart (novoLOG) injection 10 Units  10 Units Subcutaneous TID WC Franchot Gallo, MD   10 Units at 02/19/12 1206  . insulin glargine (LANTUS) injection 25 Units  25 Units Subcutaneous QHS Franchot Gallo, MD   25 Units at 02/18/12 2150  . magnesium hydroxide (MILK OF MAGNESIA) suspension 30 mL  30 mL Oral Daily PRN Wonda Cerise, MD      . nicotine (NICODERM CQ - dosed in mg/24 hours) patch 21 mg  21 mg Transdermal Q0600 Franchot Gallo, MD   21 mg at 02/19/12 0620  . QUEtiapine (SEROQUEL) tablet 100 mg  100 mg Oral QHS Franchot Gallo, MD   100 mg at 02/18/12 2141  . traMADol (ULTRAM) tablet 50 mg  50 mg Oral Q6H PRN Viviann Spare, NP   50 mg at 02/19/12 1154  . traZODone (DESYREL) tablet 200 mg  200 mg Oral QHS Franchot Gallo, MD   200 mg at 02/18/12 2138  .  DISCONTD: acetaminophen (TYLENOL) tablet 650 mg  650 mg Oral Q6H PRN Wonda Cerise, MD   650 mg at 02/18/12 1036  . DISCONTD: fluPHENAZine (PROLIXIN) tablet 5 mg  5 mg Oral BID Sanjuana Kava, NP      . DISCONTD: HYDROcodone-acetaminophen (NORCO) 5-325 MG per tablet 1 tablet  1 tablet Oral Q4H PRN Viviann Spare, NP      . DISCONTD: ibuprofen (ADVIL,MOTRIN) tablet 600 mg  600 mg Oral Q6H PRN Sanjuana Kava, NP   600 mg at 02/19/12 1610  . DISCONTD: QUEtiapine (SEROQUEL) tablet 50 mg  50 mg Oral BID PRN Sanjuana Kava, NP   50 mg at 02/19/12 0825   Lab Results:  Results for orders placed during the hospital encounter of  02/17/12 (from the past 48 hour(s))  GLUCOSE, CAPILLARY     Status: Abnormal   Collection Time   02/17/12 10:19 PM      Component Value Range Comment   Glucose-Capillary 191 (*) 70 - 99 (mg/dL)    Comment 1 Notify RN      Comment 2 Documented in Chart     GLUCOSE, CAPILLARY     Status: Abnormal   Collection Time   02/18/12  6:46 AM      Component Value Range Comment   Glucose-Capillary 347 (*) 70 - 99 (mg/dL)    Comment 1 Notify RN     HIV ANTIBODY (ROUTINE TESTING)     Status: Normal   Collection Time   02/18/12 11:05 AM      Component Value Range Comment   HIV NON REACTIVE  NON REACTIVE    GLUCOSE, CAPILLARY     Status: Abnormal   Collection Time   02/18/12 11:59 AM      Component Value Range Comment   Glucose-Capillary 391 (*) 70 - 99 (mg/dL)    Comment 1 Notify RN      Comment 2 Documented in Chart     URINALYSIS, ROUTINE W REFLEX MICROSCOPIC     Status: Abnormal   Collection Time   02/18/12  3:52 PM      Component Value Range Comment   Color, Urine YELLOW  YELLOW     APPearance CLOUDY (*) CLEAR     Specific Gravity, Urine 1.021  1.005 - 1.030     pH 6.5  5.0 - 8.0     Glucose, UA 250 (*) NEGATIVE (mg/dL)    Hgb urine dipstick NEGATIVE  NEGATIVE     Bilirubin Urine NEGATIVE  NEGATIVE     Ketones, ur NEGATIVE  NEGATIVE (mg/dL)    Protein, ur NEGATIVE  NEGATIVE (mg/dL)    Urobilinogen, UA 0.2  0.0 - 1.0 (mg/dL)    Nitrite NEGATIVE  NEGATIVE     Leukocytes, UA NEGATIVE  NEGATIVE  MICROSCOPIC NOT DONE ON URINES WITH NEGATIVE PROTEIN, BLOOD, LEUKOCYTES, NITRITE, OR GLUCOSE <1000 mg/dL.  DRUGS OF ABUSE SCREEN W ALC, ROUTINE URINE     Status: Abnormal   Collection Time   02/18/12  3:52 PM      Component Value Range Comment   Amphetamine Screen, Ur NEGATIVE  Negative     Marijuana Metabolite POSITIVE (*) Negative     Barbiturate Quant, Ur NEGATIVE  Negative     Methadone NEGATIVE  Negative     Propoxyphene NEGATIVE  Negative     Benzodiazepines. NEGATIVE  Negative      Phencyclidine (PCP) NEGATIVE  Negative     Cocaine Metabolites NEGATIVE  Negative  Opiate Screen, Urine NEGATIVE  Negative     Ethyl Alcohol <10  <10 (mg/dL)    Creatinine,U 16.1     GLUCOSE, CAPILLARY     Status: Abnormal   Collection Time   02/18/12  5:10 PM      Component Value Range Comment   Glucose-Capillary 288 (*) 70 - 99 (mg/dL)    Comment 1 Documented in Chart      Comment 2 Notify RN     GLUCOSE, CAPILLARY     Status: Abnormal   Collection Time   02/18/12  9:49 PM      Component Value Range Comment   Glucose-Capillary 324 (*) 70 - 99 (mg/dL)   GLUCOSE, CAPILLARY     Status: Abnormal   Collection Time   02/19/12  6:06 AM      Component Value Range Comment   Glucose-Capillary 258 (*) 70 - 99 (mg/dL)   CBC     Status: Abnormal   Collection Time   02/19/12  6:30 AM      Component Value Range Comment   WBC 7.0  4.0 - 10.5 (K/uL)    RBC 5.08  4.22 - 5.81 (MIL/uL)    Hemoglobin 16.1  13.0 - 17.0 (g/dL)    HCT 09.6  04.5 - 40.9 (%)    MCV 87.2  78.0 - 100.0 (fL)    MCH 31.7  26.0 - 34.0 (pg)    MCHC 36.3 (*) 30.0 - 36.0 (g/dL)    RDW 81.1  91.4 - 78.2 (%)    Platelets 289  150 - 400 (K/uL)   DIFFERENTIAL     Status: Normal   Collection Time   02/19/12  6:30 AM      Component Value Range Comment   Neutrophils Relative 60  43 - 77 (%)    Neutro Abs 4.2  1.7 - 7.7 (K/uL)    Lymphocytes Relative 30  12 - 46 (%)    Lymphs Abs 2.1  0.7 - 4.0 (K/uL)    Monocytes Relative 8  3 - 12 (%)    Monocytes Absolute 0.6  0.1 - 1.0 (K/uL)    Eosinophils Relative 2  0 - 5 (%)    Eosinophils Absolute 0.1  0.0 - 0.7 (K/uL)    Basophils Relative 1  0 - 1 (%)    Basophils Absolute 0.1  0.0 - 0.1 (K/uL)   T3, FREE     Status: Normal   Collection Time   02/19/12  6:30 AM      Component Value Range Comment   T3, Free 2.6  2.3 - 4.2 (pg/mL)   T4, FREE     Status: Normal   Collection Time   02/19/12  6:30 AM      Component Value Range Comment   Free T4 1.58  0.80 - 1.80 (ng/dL)   TSH      Status: Normal   Collection Time   02/19/12  6:30 AM      Component Value Range Comment   TSH 0.428  0.350 - 4.500 (uIU/mL)   RPR     Status: Normal   Collection Time   02/19/12  6:30 AM      Component Value Range Comment   RPR NON REACTIVE  NON REACTIVE    GLUCOSE, CAPILLARY     Status: Abnormal   Collection Time   02/19/12 11:29 AM      Component Value Range Comment   Glucose-Capillary 225 (*) 70 - 99 (mg/dL)  Comment 1 Notify RN      Time was spent today discussing with the patient the situation leading to his admission.  The patient reports that he is hearing voices as well as "seeing bugs" which are not there.  He also states that he will be having conversation with individuals and then they will disappear. He reports significant anxiety and hopelessness today.  Treatment Plan Summary:  1. Daily contact with patient to assess and evaluate symptoms and progress in treatment  2. Medication management  3. The patient will deny suicidal ideations or homicidal ideations for 48 hours prior to discharge and have a depression and anxiety rating of 3 or less. The patient will also deny any auditory or visual hallucinations or delusional thinking.   Plan:  1. Will continue the patient on her current medications.  2. Will restart Prolixin at 5 mgs po q am and hs for psychosis.  3. Will start Klonopin at 0.5 mgs po q am, 2 pm and hs for anxiety. 4. Will start cogentin 1 mg po q am and hs for EPS. 5. Will continue to monitor. 6. Laboratory studies reviewed.  Rhylin Venters 02/19/2012, 2:48 PM

## 2012-02-19 NOTE — Consult Note (Signed)
Requesting physician: Dr Harvie Heck Reading, Psychiatrist.  Reason for consultation: Management of uncontrolled type1 diabetes mellitus.  History of Present Illness: This is a 26 year old male, with known history of type 1 diabetes mellitus, depression, schizoaffective disorder, smoking history, admitted to [facility] via [facility] ED, for hallucinosis and depressive symptoms. We have been requested to assist in management of uncontrolled diabetes.  Allergies:   Allergies  Allergen Reactions  . Percocet (Oxycodone-Acetaminophen) Swelling  . Sulfa Antibiotics Other (See Comments)    Unknown - Childhood      Past Medical History  Diagnosis Date  . Diabetes mellitus   . Depression     Past Surgical History  Procedure Date  . No past surgeries   . I&d extremity 12/19/2011    Procedure: IRRIGATION AND DEBRIDEMENT EXTREMITY;  Surgeon: Sharma Covert;  Location: [facility] OR;  Service: Orthopedics;  Laterality: Left;    Scheduled Meds:    . benztropine  1 mg Oral BH-qamhs  . clonazePAM  0.5 mg Oral BH-q8a2phs  . doxycycline  200 mg Oral Q12H  . fluPHENAZine  5 mg Oral BH-qamhs  . insulin aspart  0-9 Units Subcutaneous TID WC  . insulin aspart  6 Units Subcutaneous TID WC  . insulin glargine  30 Units Subcutaneous QHS  . insulin NPH  10 Units Subcutaneous Once  . nicotine  21 mg Transdermal Q0600  . QUEtiapine  100 mg Oral QHS  . traZODone  200 mg Oral QHS  . DISCONTD: insulin aspart  0-15 Units Subcutaneous TID WC  . DISCONTD: insulin aspart  10 Units Subcutaneous TID WC  . DISCONTD: insulin glargine  25 Units Subcutaneous QHS   Continuous Infusions:  PRN Meds:.alum & mag hydroxide-simeth, hydrOXYzine, ibuprofen, magnesium hydroxide, traMADol, DISCONTD: acetaminophen, DISCONTD: HYDROcodone-acetaminophen, DISCONTD: ibuprofen, DISCONTD: QUEtiapine  Social History:  reports that he has been smoking.  He does not have any smokeless tobacco history on file. He reports that he does not  drink alcohol or use illicit drugs.  No family history on file.  Review of Systems:  Patent denies fever, chills, headache, blurred vision, difficulty in speaking, dysphagia, chest pain, cough, shortness of breath, orthopnea, paroxysmal nocturnal dyspnea, nausea, diaphoresis, abdominal pain, vomiting, diarrhea, belching, heartburn, hematemesis, melena, dysuria, nocturia, urinary frequency, hematochezia, lower extremity swelling, pain, or redness.  Physical Exam: Blood pressure 104/69, pulse 105, temperature 97.7 F (36.5 C), temperature source Oral, resp. rate 20, height 5' 9.5" (1.765 m), weight 59.875 kg (132 lb). General:  Patient does not appear to be in obvious acute distress. Alert, communicative, fully oriented, talking in complete sentences, not short of breath at rest.  HEENT:  No clinical pallor, no jaundice, no conjunctival injection or discharge. NECK:  Supple, JVP not seen, no carotid bruits, no palpable lymphadenopathy, no palpable goiter. CHEST:  Clinically clear to auscultation, no wheezes, no crackles. HEART:  Sounds 1 and 2 heard, normal, regular, no murmurs. ABDOMEN:  Flat, soft, non-tender, no palpable organomegaly, no palpable masses, normal bowel sounds. GENITALIA:  Not examined. LOWER EXTREMITIES:  No pitting edema, palpable peripheral pulses. MUSCULOSKELETAL SYSTEM:  unremarkable. CENTRAL NERVOUS SYSTEM:  No focal neurologic deficit on gross examination.  Labs on Admission:  Results for orders placed during the hospital encounter of 02/17/12 (from the past 48 hour(s))  GLUCOSE, CAPILLARY     Status: Abnormal   Collection Time   02/17/12 10:19 PM      Component Value Range Comment   Glucose-Capillary 191 (*) 70 - 99 (mg/dL)  Comment 1 Notify RN      Comment 2 Documented in Chart     GLUCOSE, CAPILLARY     Status: Abnormal   Collection Time   02/18/12  6:46 AM      Component Value Range Comment   Glucose-Capillary 347 (*) 70 - 99 (mg/dL)    Comment 1 Notify RN      HIV ANTIBODY (ROUTINE TESTING)     Status: Normal   Collection Time   02/18/12 11:05 AM      Component Value Range Comment   HIV NON REACTIVE  NON REACTIVE    GLUCOSE, CAPILLARY     Status: Abnormal   Collection Time   02/18/12 11:59 AM      Component Value Range Comment   Glucose-Capillary 391 (*) 70 - 99 (mg/dL)    Comment 1 Notify RN      Comment 2 Documented in Chart     URINALYSIS, ROUTINE W REFLEX MICROSCOPIC     Status: Abnormal   Collection Time   02/18/12  3:52 PM      Component Value Range Comment   Color, Urine YELLOW  YELLOW     APPearance CLOUDY (*) CLEAR     Specific Gravity, Urine 1.021  1.005 - 1.030     pH 6.5  5.0 - 8.0     Glucose, UA 250 (*) NEGATIVE (mg/dL)    Hgb urine dipstick NEGATIVE  NEGATIVE     Bilirubin Urine NEGATIVE  NEGATIVE     Ketones, ur NEGATIVE  NEGATIVE (mg/dL)    Protein, ur NEGATIVE  NEGATIVE (mg/dL)    Urobilinogen, UA 0.2  0.0 - 1.0 (mg/dL)    Nitrite NEGATIVE  NEGATIVE     Leukocytes, UA NEGATIVE  NEGATIVE  MICROSCOPIC NOT DONE ON URINES WITH NEGATIVE PROTEIN, BLOOD, LEUKOCYTES, NITRITE, OR GLUCOSE <1000 mg/dL.  DRUGS OF ABUSE SCREEN W ALC, ROUTINE URINE     Status: Abnormal   Collection Time   02/18/12  3:52 PM      Component Value Range Comment   Amphetamine Screen, Ur NEGATIVE  Negative     Marijuana Metabolite POSITIVE (*) Negative     Barbiturate Quant, Ur NEGATIVE  Negative     Methadone NEGATIVE  Negative     Propoxyphene NEGATIVE  Negative     Benzodiazepines. NEGATIVE  Negative     Phencyclidine (PCP) NEGATIVE  Negative     Cocaine Metabolites NEGATIVE  Negative     Opiate Screen, Urine NEGATIVE  Negative     Ethyl Alcohol <10  <10 (mg/dL)    Creatinine,U 16.1     GLUCOSE, CAPILLARY     Status: Abnormal   Collection Time   02/18/12  5:10 PM      Component Value Range Comment   Glucose-Capillary 288 (*) 70 - 99 (mg/dL)    Comment 1 Documented in Chart      Comment 2 Notify RN     GLUCOSE, CAPILLARY     Status: Abnormal    Collection Time   02/18/12  9:49 PM      Component Value Range Comment   Glucose-Capillary 324 (*) 70 - 99 (mg/dL)   GLUCOSE, CAPILLARY     Status: Abnormal   Collection Time   02/19/12  6:06 AM      Component Value Range Comment   Glucose-Capillary 258 (*) 70 - 99 (mg/dL)   CBC     Status: Abnormal   Collection Time   02/19/12  6:30  AM      Component Value Range Comment   WBC 7.0  4.0 - 10.5 (K/uL)    RBC 5.08  4.22 - 5.81 (MIL/uL)    Hemoglobin 16.1  13.0 - 17.0 (g/dL)    HCT 16.1  09.6 - 04.5 (%)    MCV 87.2  78.0 - 100.0 (fL)    MCH 31.7  26.0 - 34.0 (pg)    MCHC 36.3 (*) 30.0 - 36.0 (g/dL)    RDW 40.9  81.1 - 91.4 (%)    Platelets 289  150 - 400 (K/uL)   DIFFERENTIAL     Status: Normal   Collection Time   02/19/12  6:30 AM      Component Value Range Comment   Neutrophils Relative 60  43 - 77 (%)    Neutro Abs 4.2  1.7 - 7.7 (K/uL)    Lymphocytes Relative 30  12 - 46 (%)    Lymphs Abs 2.1  0.7 - 4.0 (K/uL)    Monocytes Relative 8  3 - 12 (%)    Monocytes Absolute 0.6  0.1 - 1.0 (K/uL)    Eosinophils Relative 2  0 - 5 (%)    Eosinophils Absolute 0.1  0.0 - 0.7 (K/uL)    Basophils Relative 1  0 - 1 (%)    Basophils Absolute 0.1  0.0 - 0.1 (K/uL)   T3, FREE     Status: Normal   Collection Time   02/19/12  6:30 AM      Component Value Range Comment   T3, Free 2.6  2.3 - 4.2 (pg/mL)   T4, FREE     Status: Normal   Collection Time   02/19/12  6:30 AM      Component Value Range Comment   Free T4 1.58  0.80 - 1.80 (ng/dL)   TSH     Status: Normal   Collection Time   02/19/12  6:30 AM      Component Value Range Comment   TSH 0.428  0.350 - 4.500 (uIU/mL)   RPR     Status: Normal   Collection Time   02/19/12  6:30 AM      Component Value Range Comment   RPR NON REACTIVE  NON REACTIVE    GLUCOSE, CAPILLARY     Status: Abnormal   Collection Time   02/19/12 11:29 AM      Component Value Range Comment   Glucose-Capillary 225 (*) 70 - 99 (mg/dL)    Comment 1 Notify RN        Radiological Exams on Admission: No results found.  Assessment/Plan Principal Problem:  *Schizophrenia, undifferentiated: Manage per primary. Active Problems: 1.  Generalized anxiety disorder: Per primary 2. Diabetes type 1, uncontrolled: CBGs are currently in the middle 200s, despite appropriate diet, Lantus at 25 units qhs, and meal cover with 10 units Novolog, t.i.d.  I recommend management with SSI in addition, and will decrease meal cover to 6 units. I have increased Lantus to 30 units qhs. Meanwhile, give a single dose of 10 units NPH now. 3. Smoking history: Patient smokes about a pack of cigarettes per day. He has already been counseled, and is on Nicoderm CQ.  Thanks for the consultation. We shall follow with you.   Time Spent on Admission: 45 mins.  Colena Ketterman,CHRISTOPHER 02/19/2012, 3:44 PM

## 2012-02-19 NOTE — Progress Notes (Signed)
Patient ID: [name], male   DOB: 01-10-1986, 26 y.o.   MRN: [id number] The patient was very irritable this evening because he said he was threatened by another patient. He started to make threatening comments that he was going to hit this patient if he comes near him. Was encouraged to keep his distance from this patient and that our goal was to keep all patients safe. Frequently seeks medication, requesting Klonopin and Percocet. States he is hearing voices. Denies any suicidal ideation.

## 2012-02-19 NOTE — Progress Notes (Signed)
[facility] Group Notes:  (Counselor/Nursing/MHT/Case Management/Adjunct)  02/19/2012 4:06 PM  Type of Therapy:  Group Therapy  Participation Level:  Active  Participation Quality:  Appropriate  Affect:  Appropriate  Cognitive:  Oriented  Insight:  Limited  Engagement in Group:  Good  Engagement in Therapy:  Good  Modes of Intervention:  Education, Problem-solving, Support and Exploration  Summary of Progress/Problems: Patient stated that he has an ACTT ([facility]) so he was getting medications but he started having more paranoid thoughts and thought he should come in the hospital . Irritated with doctor for not giving him the Klonopin he needs. Stated he got it in [place] and [place], but now can't get it here. I'm going to have an illness the rest of my life who cares if I'm addicted to a medicine all my life. Encouraged treatment of his hallucinations and paranoid thoughts which are causing some of his anxiety.   Khadir Roam, Aram Beecham 02/19/2012, 4:06 PM

## 2012-02-20 LAB — BASIC METABOLIC PANEL
BUN: 17 mg/dL (ref 6–23)
CO2: 32 mEq/L (ref 19–32)
Chloride: 100 mEq/L (ref 96–112)
GFR calc Af Amer: >90 mL/min (ref >90)
Glucose, Bld: 187 mg/dL — ABNORMAL HIGH (ref 70–99)
Potassium: 4.2 mEq/L (ref 3.5–5.1)

## 2012-02-20 LAB — GLUCOSE, CAPILLARY
Glucose-Capillary: 191 mg/dL — ABNORMAL HIGH (ref 70–99)
Glucose-Capillary: 320 mg/dL — ABNORMAL HIGH (ref 70–99)
Glucose-Capillary: 285 mg/dL — ABNORMAL HIGH (ref 70–99)

## 2012-02-20 MED ORDER — HYDROCODONE-ACETAMINOPHEN 5-325 MG PO TABS
1.0000 | ORAL_TABLET | ORAL | Status: DC | PRN
  Administered 2012-02-20 – 2012-02-22 (×8): 1 via ORAL
  Filled 2012-02-20 (×8): qty 1

## 2012-02-20 MED ORDER — INSULIN ASPART 100 UNIT/ML ~~LOC~~ SOLN
0.0000 [IU] | Freq: Every day | SUBCUTANEOUS | Status: DC
  Administered 2012-02-20 – 2012-02-21 (×2): 2 [IU] via SUBCUTANEOUS

## 2012-02-20 MED ORDER — INSULIN ASPART 100 UNIT/ML ~~LOC~~ SOLN
0.0000 [IU] | Freq: Three times a day (TID) | SUBCUTANEOUS | Status: DC
  Administered 2012-02-20: 11 [IU] via SUBCUTANEOUS
  Administered 2012-02-20: 15 [IU] via SUBCUTANEOUS
  Administered 2012-02-21: 4 [IU] via SUBCUTANEOUS
  Administered 2012-02-21: 20 [IU] via SUBCUTANEOUS
  Administered 2012-02-22 (×2): 7 [IU] via SUBCUTANEOUS

## 2012-02-20 MED ORDER — INSULIN ASPART 100 UNIT/ML ~~LOC~~ SOLN
8.0000 [IU] | Freq: Three times a day (TID) | SUBCUTANEOUS | Status: DC
  Administered 2012-02-20 – 2012-02-22 (×6): 8 [IU] via SUBCUTANEOUS
  Filled 2012-02-20: qty 3

## 2012-02-20 MED ORDER — CLONAZEPAM 1 MG PO TABS
1.0000 mg | ORAL_TABLET | ORAL | Status: DC
  Administered 2012-02-20 – 2012-02-22 (×5): 1 mg via ORAL
  Filled 2012-02-20: qty 1
  Filled 2012-02-20: qty 21
  Filled 2012-02-20: qty 1
  Filled 2012-02-20 (×2): qty 21
  Filled 2012-02-20: qty 1
  Filled 2012-02-20: qty 21
  Filled 2012-02-20 (×2): qty 1

## 2012-02-20 MED ORDER — INSULIN GLARGINE 100 UNIT/ML ~~LOC~~ SOLN
35.0000 [IU] | Freq: Every day | SUBCUTANEOUS | Status: DC
  Administered 2012-02-20 – 2012-02-21 (×2): 35 [IU] via SUBCUTANEOUS
  Filled 2012-02-20: qty 3

## 2012-02-20 MED ORDER — INSULIN NPH (HUMAN) (ISOPHANE) 100 UNIT/ML ~~LOC~~ SUSP
10.0000 [IU] | Freq: Once | SUBCUTANEOUS | Status: AC
  Administered 2012-02-20: 10 [IU] via SUBCUTANEOUS
  Filled 2012-02-20: qty 10

## 2012-02-20 NOTE — Progress Notes (Signed)
Pt states he slept poor, appetite is good, energy level is low, focus is good. Pt rates his depression as a 9, and hopelessness as a 10, although he told this RN he was feeling better. Pt denies SI/HI. Minimal insight as pt's goal is "I don't know yet." Pt has received, Tramadol, Vistaril and Ibuprofen this am for c/o pain in his thumb. Pt rates this pain at 10.

## 2012-02-20 NOTE — Progress Notes (Signed)
Patient ID: [name], male   DOB: August 06, 1986, 26 y.o.   MRN: [id number] (D) Pt. Awake, alert, NAD.  Wearing dark blue scrubs.  Appropriately groomed.  Affect is bright.  Behavior is attention seeking.    (A) Discussed nursing plan of care.  Assessed surgical wound on Left hand, healing well, no drainage, covered by bandaid.    (R) Pt. Exhibiting attention-seeking behavior.  He is repeatedly asking about his medications and what he can have next.  He denies SI/HI.  Denies A/V hallucinations at this time; he stated he was having auditory hallucinations earlier today but they are gone now.  He states that he is having anxiety and also pain associated with the healing surgical wound on his left hand.  He states that his medication is helping with the anxiety and the pain.

## 2012-02-20 NOTE — Progress Notes (Signed)
[facility] Group Notes:  (Counselor/Nursing/MHT/Case Management/Adjunct)  02/20/2012 4:40 PM  Type of Therapy:  Group Therapy  Participation Level:  Active  Participation Quality:  Appropriate  Affect:  Depressed  Cognitive:  Oriented  Insight:  Good  Engagement in Group:  Good  Engagement in Therapy:  Good  Modes of Intervention:  Education, Problem-solving, Support and Exploration  Summary of Progress/Problems: Patient was in and out of group. Did not verbally participate   Veto Kemps 02/20/2012, 4:40 PM

## 2012-02-20 NOTE — Progress Notes (Signed)
Subjective: Asymptomatic.  Objective: Vital signs in last 24 hours: Temp:  [97.7 F (36.5 C)] 97.7 F (36.5 C) (03/05 0613) Pulse Rate:  [96-106] 106  (03/05 0614) Resp:  [16] 16  (03/05 0613) BP: (110-115)/(65-68) 115/68 mmHg (03/05 1610) Weight change:  Last BM Date: 02/19/12  Intake/Output from previous day:       Physical Exam: General: Patient does not appear to be in obvious acute distress. Alert, communicative, fully oriented, talking in complete sentences, not short of breath at rest.  HEENT: No clinical pallor, no jaundice, no conjunctival injection or discharge.  NECK: Supple, JVP not seen, no carotid bruits, no palpable lymphadenopathy, no palpable goiter.  CHEST: Clinically clear to auscultation, no wheezes, no crackles.  HEART: Sounds 1 and 2 heard, normal, regular, no murmurs.  ABDOMEN: Flat, soft, non-tender, no palpable organomegaly, no palpable masses, normal bowel sounds.  GENITALIA: Not examined.  LOWER EXTREMITIES: No pitting edema, palpable peripheral pulses.  MUSCULOSKELETAL SYSTEM: unremarkable.  CENTRAL NERVOUS SYSTEM: No focal neurologic deficit on gross examination.     Lab Results:  Basename 02/19/12 0630  WBC 7.0  HGB 16.1  HCT 44.3  PLT 289    Basename 02/20/12 0630  NA 137  K 4.2  CL 100  CO2 32  GLUCOSE 187*  BUN 17  CREATININE 0.49*  CALCIUM 9.2   No results found for this or any previous visit (from the past 240 hour(s)).   Studies/Results: No results found.  Medications: Scheduled Meds:   . benztropine  1 mg Oral BH-qamhs  . clonazePAM  1 mg Oral BH-q8a2phs  . doxycycline  200 mg Oral Q12H  . fluPHENAZine  5 mg Oral BH-qamhs  . insulin aspart  0-20 Units Subcutaneous TID WC  . insulin aspart  0-5 Units Subcutaneous QHS  . insulin aspart  8 Units Subcutaneous TID WC  . insulin glargine  35 Units Subcutaneous QHS  . insulin NPH  10 Units Subcutaneous Once  . nicotine  21 mg Transdermal Q0600  . QUEtiapine  100 mg  Oral QHS  . sertraline  50 mg Oral Daily  . traZODone  200 mg Oral QHS  . DISCONTD: clonazePAM  0.5 mg Oral BH-q8a2phs  . DISCONTD: insulin aspart  0-9 Units Subcutaneous TID WC  . DISCONTD: insulin aspart  6 Units Subcutaneous TID WC  . DISCONTD: insulin glargine  30 Units Subcutaneous QHS   Continuous Infusions:  PRN Meds:.alum & mag hydroxide-simeth, HYDROcodone-acetaminophen, hydrOXYzine, ibuprofen, magnesium hydroxide, DISCONTD: traMADol  Assessment/Plan: Principal Problem:  *Schizophrenia, undifferentiated: Manage per primary.  Active Problems:  1. Generalized anxiety disorder: Per primary  2. Diabetes type 1, uncontrolled: CBGs are still in the middle 200s, despite appropriate diet, Lantus at 30 units qhs, SSI and meal cover with 6 units Novolog, t.i.d. I have added qhs cover, increased meal cover to 8 units, and Lantus to 35 Units, today. Further titration may be needed.  3. Smoking history: Patient smokes about a pack of cigarettes per day. He has already been counseled, and is on Nicoderm CQ.    LOS: 3 days   Laylanie Kruczek,[name] 02/20/2012, 5:21 PM

## 2012-02-20 NOTE — Progress Notes (Signed)
Patient ID: [name], male   DOB: 1986-06-09, 26 y.o.   MRN: [id number] Pleasant on approach, congenial, polite, cooperative. Says he is having throbbing pain in his L thumb and he is asking to go back on Lortab which Dr. Melvyn Novas has prescribed for him.  He denies any auditory hallucinations today, but in group therapy he mentioned that they were there intermittently.  He denies any suicidal thoughts, and rates his depression a 0/10 and anxiety a 0/10.  He plans to go back to his own place, and follow up with his hand surgeon on Friday as scheduled.   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  Continues to smoke cannabis.   Plan;  Discharge planning working on follow-up and will discharge in am if remains stable.

## 2012-02-20 NOTE — Progress Notes (Signed)
Met pt coming out of his room.  He had attended evening wrap up group.  He reports he is rather sleepy and wants to go to bed early.  Inform pt that after his CBG is obtained, he can receive his meds.  He voices no concerns/needs at this time.  He denies SI/HI/AV to this Clinical research associate.  His CBGs continue to run high.  Internal medicine came today for a consult, and pt's insulin orders have been changed.  He is to receive 30 units of Lantus tonight.  He also had meal coverage added to the correction insulin scale.  Pt encouraged to make needs known to staff.  Pt voices understanding.  Safety maintained with q15 minute checks.

## 2012-02-20 NOTE — Tx Team (Signed)
Interdisciplinary Treatment Plan Update (Adult)  Date:  02/20/2012  Time Reviewed:  9:36 AM   Progress in Treatment: Attending groups:   Yes   Participating in groups:  Yes Taking medication as prescribed:  Yes Tolerating medication:  Yes Family/Significant othe contact made:  Patient understands diagnosis:  Yes Discussing patient identified problems/goals with staff: Yes Medical problems stabilized or resolved: Yes Denies suicidal/homicidal ideation:Yes Issues/concerns per patient self-inventory: None Other:  New problem(s) identified:  None  Reason for Continuation of Hospitalization: Anxiety Hallucinations Medication stabilization  Interventions implemented related to continuation of hospitalization:  Medication monitoring and adjustment, safety checks Q15 min., suicide risk assessment, group therapy, psychoeducation, collateral contact, aftercare planning, ongoing physician assessments, medication education  Additional comments:  None  Estimated length of stay:  1-2 days  Discharge Plan:  Will return home to stay with his roommate with intention of moving out as quickly as possible, maybe will go to [facility] for a month or so (is eligible since has been >6 months) - Will follow up with [facility] ACTT  New goal(s):  N/A  Review of initial/current patient goals per problem list:    1.  Goal(s):  Reduce/eliminate psychosis (Patient admitted endorsing A/V, and tactile hallucinations)  Met:  No  Target date: d/c  As evidenced by: Casimiro Needle will endorse returning to baselines (no hallucinations or less frequent occurrences) - Tells Nurse Practitioner is not hearing voices today, but tells group he is hearing voices currently  2.  Goal (s): Eliminate SI/thoughts of self harm  Met: No  Target date: d/c  As evidenced by:  Patient will report that he is not having SI or other thouthts of self harm - On 3/5 denies SI, needs to deny 48 hours prior to discharge.  3.  Goal(s):   Stabilize on medications  Met: Yes  Target date: d/c  As evidenced by:  Patient will report medications are working - feels that meds are effective, will follow up with [facility] ACTT  4.  Goal(s):  Explore options for housing  Met:  Yes  Target date:  d/c  As evidenced by:  Patient advised of plans of where he will live at d/c, states he will go back to his former residence for now until can make arrangements to go elsewhere.  May go to [facility] for a month while looks for another place.  Attendees: Patient:  [name]  02/20/2012 11:35 AM   Family:     Physician:  Franchot Gallo, MD 02/20/2012 9:36 AM   Nursing:   Neill Loft, RN 02/20/2012 9:36 AM   CaseManager:  Juline Patch, LCSW 02/20/2012 9:36 AM   Counselor: Veto Kemps, MT-BC 02/20/2012 9:36 AM   Other:  Ambrose Mantle, LCSW 02/20/2012 9:36 AM   Other:     Other:     Other:      Scribe for Treatment Team:   Wynn Banker, LCSW,  02/20/2012 9:36 AM

## 2012-02-21 LAB — GLUCOSE, CAPILLARY
Glucose-Capillary: 97 mg/dL (ref 70–99)
Glucose-Capillary: 199 mg/dL — ABNORMAL HIGH (ref 70–99)
Glucose-Capillary: 420 mg/dL — ABNORMAL HIGH (ref 70–99)
Glucose-Capillary: 430 mg/dL — ABNORMAL HIGH (ref 70–99)
Glucose-Capillary: 267 mg/dL — ABNORMAL HIGH (ref 70–99)

## 2012-02-21 LAB — THC (MARIJUANA), URINE, CONFIRMATION: Marijuana, Ur-Confirmation: 46 NG/ML — ABNORMAL HIGH

## 2012-02-21 MED ORDER — INSULIN ASPART 100 UNIT/ML ~~LOC~~ SOLN
20.0000 [IU] | Freq: Once | SUBCUTANEOUS | Status: DC
  Filled 2012-02-21: qty 3

## 2012-02-21 MED ORDER — GABAPENTIN 400 MG PO CAPS
400.0000 mg | ORAL_CAPSULE | Freq: Three times a day (TID) | ORAL | Status: DC | PRN
  Administered 2012-02-21 – 2012-02-22 (×3): 400 mg via ORAL
  Filled 2012-02-21 (×3): qty 1

## 2012-02-21 MED ORDER — QUETIAPINE FUMARATE 200 MG PO TABS
200.0000 mg | ORAL_TABLET | Freq: Every day | ORAL | Status: DC
  Administered 2012-02-21: 200 mg via ORAL
  Filled 2012-02-21 (×2): qty 1

## 2012-02-21 NOTE — Progress Notes (Signed)
Writer met with patient who advised of continuing to have A/H.  He denies SI/HI.  Patient shared that he is concerned that he may not be able to return to his previous living arrangement.  Patient advised that he is upset that roommate has called his (patient's) employer and reported patient is using drugs.  He rates depression three, anxiety at seven and hopelessness/helplessness at ten.

## 2012-02-21 NOTE — Progress Notes (Signed)
Patient is complaining of anxiety and hopelessness.  Patient has been med seeking during the morning with multiple medical complaints. Patient reports being upset with roommate at home and not sleeping well.  Offered support, 15 minute checks.  Administered Norco for pain and Vistaril for anxiety.  Safety maintained.

## 2012-02-21 NOTE — Progress Notes (Signed)
Subjective:   Chart reviewed. Diabetic since age 26 yrs. Has been on Lantus 25 units at bedtime, Novolog 10 units tid with meals and SSI prior to admission. Says has no place to follow up or get insulins on discharge. Blood sugars at home range in 250-300 mg/dl. Non compliant with diet. Ate 6 cookies and drank Gatorade this afternoon and blood sugars now greater 400 mg/dl.  Objective  Vital signs in last 24 hours: Filed Vitals:   02/21/12 0640 02/21/12 0641 02/21/12 1230 02/21/12 1231  BP: 102/60 101/66 106/68 113/70  Pulse: 100 127 90 101  Temp: 97.8 F (36.6 C)     TempSrc: Oral     Resp: 16     Height:      Weight:       Weight change:  No intake or output data in the 24 hours ending 02/21/12 1755  Physical Exam:  General Exam: Comfortable. Ambulating. Respiratory System: Clear. No increased work of breathing.  Cardiovascular System: First and second heart sounds heard. Regular rate and rhythm. No JVD/murmurs.  Gastrointestinal System: Abdomen is non distended, soft and normal bowel sounds heard.  Central Nervous System: Alert and oriented. No focal neurological deficits.  Labs:  Basic Metabolic Panel:  Lab 02/20/12 4132  NA 137  K 4.2  CL 100  CO2 32  GLUCOSE 187*  BUN 17  CREATININE 0.49*  CALCIUM 9.2  ALB --  PHOS --   Liver Function Tests: No results found for this basename: AST:3,ALT:3,ALKPHOS:3,BILITOT:3,PROT:3,ALBUMIN:3 in the last 168 hours No results found for this basename: LIPASE:3,AMYLASE:3 in the last 168 hours No results found for this basename: AMMONIA:3 in the last 168 hours CBC:  Lab 02/19/12 0630  WBC 7.0  NEUTROABS 4.2  HGB 16.1  HCT 44.3  MCV 87.2  PLT 289   Cardiac Enzymes: No results found for this basename: CKTOTAL:5,CKMB:5,CKMBINDEX:5,TROPONINI:5 in the last 168 hours CBG:  Lab 02/21/12 1700 02/21/12 1657 02/21/12 1201 02/21/12 0610 02/20/12 2154  GLUCAP 430* 420* 199* 97 247*    Iron Studies: No results found for this  basename: IRON,TIBC,TRANSFERRIN,FERRITIN in the last 72 hours Studies/Results: No results found. Medications:      . benztropine  1 mg Oral BH-qamhs  . clonazePAM  1 mg Oral BH-q8a2phs  . doxycycline  200 mg Oral Q12H  . fluPHENAZine  5 mg Oral BH-qamhs  . insulin aspart  0-20 Units Subcutaneous TID WC  . insulin aspart  0-5 Units Subcutaneous QHS  . insulin aspart  20 Units Subcutaneous Once  . insulin aspart  8 Units Subcutaneous TID WC  . insulin glargine  35 Units Subcutaneous QHS  . nicotine  21 mg Transdermal Q0600  . QUEtiapine  200 mg Oral QHS  . sertraline  50 mg Oral Daily  . traZODone  200 mg Oral QHS  . DISCONTD: QUEtiapine  100 mg Oral QHS    I  have reviewed scheduled and prn medications.     Problem/Plan: Principal Problem:  *Schizophrenia, undifferentiated Active Problems:  Generalized anxiety disorder  Diabetes type 1, uncontrolled  1. Uncontrolled DM 1: Patient volunteers that it is unlikely he will be compliant with his diet and asks re Insulin pump. Advised patient that it will be very difficult to adequately control his DM if he is not on consistent diet and medications. He verbalizes understanding. Continue current Insulin regimen for now. Will consult Diabetes coordinator and may need to consider switching to 70/30 mix insulin for affordability on discharge. Difficult situation! 2.  Schizophrenia: Management per primary service. 3. Tobacco Abuse  Discussed with Dr. Allena Katz.  [name] 02/21/2012,5:55 PM  LOS: 4 days

## 2012-02-21 NOTE — Progress Notes (Addendum)
Patient ID: [name], male   DOB: [birth year], 26 y.o.   MRN: [id number] Pt is awake and active on the unit this PM. Pt denies SI/HI and is cooperative with staff. Pt states that he experiences AVH. He sometimes hears voices and has conversations with people who he knows are not there. Pt sees them only briefly before they disappear again. Pt also states that he hears sounds like chirping and sirens. Pt does not seem to be responding to internal stimuli. Pt went downstairs to the gym for recreation and was in good spirits. Writer will continue to monitor.  Pt CBG was 430 prior to dinner. NP notified and 28 units given per current insulin orders. Pt states that he has eaten several snacks since lunch that he gets from other pts. Pt also refused to give up his Gatorade stating that his CBG was "normal for him." Writer educated pt about the importance of healthy diabetes management including good snack choices. Pt verbally acknowledged understanding. Writer will continue to monitor and instruct staff to closely monitor snacks.

## 2012-02-21 NOTE — Progress Notes (Signed)
[facility] MD Progress Note  02/21/2012 1:25 PM  Diagnosis:  Axis I: Schizophrenia - Undifferentiated Type.  Generalized Anxiety Disorder.   The patient was seen today and reports the following:   ADL's: Intact.  Sleep: The patient reports to having ongoing difficulty initiating and maintaining sleep.  Appetite: The patient reports a good appetite today.   Mild>(1-10) >Severe  Hopelessness (1-10): 10  Depression (1-10): 0  Anxiety (1-10): 6   Suicidal Ideation: The patient adamantly denies any suicidal ideations today.  Plan: No  Intent: No  Means: No  Homicidal Ideation: The patient adamantly denies any homicidal ideations today.  Plan: No  Intent: No.  Means: No   General Appearance/Behavior: The patient continue to be friendly and cooperative with treatment team. Eye Contact: Good.  Speech: Appropriate in rate and volume with no pressuring noted.  Motor Behavior: wnl.  Level of Consciousness: Alert and Oriented x 3.  Mental Status: Alert and Oriented x 3.  Mood: Moderately depressed in appearance although he rates his depression as a 0.  Affect: Moderately constricted.  Anxiety Level: Moderate anxiety reported.  Thought Process: Auditory and visual hallucinations reported.  Thought Content: The patient reports ongoing auditory and visual hallucinations but denies any delusional thinking.  Perception:. Auditory and visual hallucinations continue .  Judgment: Fair.  Insight: Fair.  Cognition: Oriented to time, place and person.  Sleep:  Number of Hours: 6.5    Vital Signs:Blood pressure 101/66, pulse 127, temperature 97.8 F (36.6 C), temperature source Oral, resp. rate 16, height 5' 9.5" (1.765 m), weight 59.875 kg (132 lb).  Current Medications: Current Facility-Administered Medications  Medication Dose Route Frequency Provider Last Rate Last Dose  . alum & mag hydroxide-simeth (MAALOX/MYLANTA) 200-200-20 MG/5ML suspension 30 mL  30 mL Oral Q4H PRN Wonda Cerise, MD      .  benztropine (COGENTIN) tablet 1 mg  1 mg Oral BH-qamhs Alayiah Fontes, MD   1 mg at 02/21/12 0753  . clonazePAM (KLONOPIN) tablet 1 mg  1 mg Oral BH-q8a2phs Franchot Gallo, MD   1 mg at 02/21/12 0753  . doxycycline (VIBRA-TABS) tablet 200 mg  200 mg Oral Q12H Franchot Gallo, MD   200 mg at 02/21/12 0953  . fluPHENAZine (PROLIXIN) tablet 5 mg  5 mg Oral BH-qamhs Delvon Chipps, MD   5 mg at 02/21/12 0753  . gabapentin (NEURONTIN) capsule 400 mg  400 mg Oral Q8H PRN Franchot Gallo, MD      . HYDROcodone-acetaminophen (NORCO) 5-325 MG per tablet 1 tablet  1 tablet Oral Q4H PRN Viviann Spare, NP   1 tablet at 02/21/12 1204  . ibuprofen (ADVIL,MOTRIN) tablet 600 mg  600 mg Oral Q6H PRN Franchot Gallo, MD   600 mg at 02/20/12 0909  . insulin aspart (novoLOG) injection 0-20 Units  0-20 Units Subcutaneous TID WC Isidor Holts, MD   4 Units at 02/21/12 1203  . insulin aspart (novoLOG) injection 0-5 Units  0-5 Units Subcutaneous QHS Isidor Holts, MD   2 Units at 02/20/12 2211  . insulin aspart (novoLOG) injection 8 Units  8 Units Subcutaneous TID WC Isidor Holts, MD   8 Units at 02/21/12 1203  . insulin glargine (LANTUS) injection 35 Units  35 Units Subcutaneous QHS Isidor Holts, MD   35 Units at 02/20/12 2209  . insulin NPH (HUMULIN N,NOVOLIN N) injection 10 Units  10 Units Subcutaneous Once Isidor Holts, MD   10 Units at 02/20/12 1448  . magnesium hydroxide (MILK OF MAGNESIA) suspension 30  mL  30 mL Oral Daily PRN Wonda Cerise, MD      . nicotine (NICODERM CQ - dosed in mg/24 hours) patch 21 mg  21 mg Transdermal Q0600 Franchot Gallo, MD   21 mg at 02/21/12 1120  . QUEtiapine (SEROQUEL) tablet 200 mg  200 mg Oral QHS Erna Brossard, MD      . sertraline (ZOLOFT) tablet 50 mg  50 mg Oral Daily Franchot Gallo, MD   50 mg at 02/21/12 0753  . traZODone (DESYREL) tablet 200 mg  200 mg Oral QHS Franchot Gallo, MD   200 mg at 02/20/12 2213  . DISCONTD: clonazePAM (KLONOPIN) tablet 0.5 mg  0.5 mg  Oral BH-q8a2phs Adryana Mogensen, MD   0.5 mg at 02/20/12 1331  . DISCONTD: hydrOXYzine (ATARAX/VISTARIL) tablet 25 mg  25 mg Oral Q6H PRN Wonda Cerise, MD   25 mg at 02/21/12 0955  . DISCONTD: insulin aspart (novoLOG) injection 6 Units  6 Units Subcutaneous TID WC Isidor Holts, MD   6 Units at 02/20/12 1159  . DISCONTD: insulin glargine (LANTUS) injection 30 Units  30 Units Subcutaneous QHS Isidor Holts, MD   30 Units at 02/19/12 2155  . DISCONTD: QUEtiapine (SEROQUEL) tablet 100 mg  100 mg Oral QHS Franchot Gallo, MD   100 mg at 02/20/12 2213  . DISCONTD: traMADol (ULTRAM) tablet 50 mg  50 mg Oral Q6H PRN Viviann Spare, NP   50 mg at 02/20/12 1331   Lab Results:  Results for orders placed during the hospital encounter of 02/17/12 (from the past 48 hour(s))  GLUCOSE, CAPILLARY     Status: Abnormal   Collection Time   02/19/12  5:00 PM      Component Value Range Comment   Glucose-Capillary 301 (*) 70 - 99 (mg/dL)   GLUCOSE, CAPILLARY     Status: Abnormal   Collection Time   02/19/12  9:31 PM      Component Value Range Comment   Glucose-Capillary 372 (*) 70 - 99 (mg/dL)   GLUCOSE, CAPILLARY     Status: Abnormal   Collection Time   02/20/12  6:20 AM      Component Value Range Comment   Glucose-Capillary 191 (*) 70 - 99 (mg/dL)   BASIC METABOLIC PANEL     Status: Abnormal   Collection Time   02/20/12  6:30 AM      Component Value Range Comment   Sodium 137  135 - 145 (mEq/L)    Potassium 4.2  3.5 - 5.1 (mEq/L)    Chloride 100  96 - 112 (mEq/L)    CO2 32  19 - 32 (mEq/L)    Glucose, Bld 187 (*) 70 - 99 (mg/dL)    BUN 17  6 - 23 (mg/dL)    Creatinine, Ser 4.78 (*) 0.50 - 1.35 (mg/dL)    Calcium 9.2  8.4 - 10.5 (mg/dL)    GFR calc non Af Amer >90  >90 (mL/min)    GFR calc Af Amer >90  >90 (mL/min)   GLUCOSE, CAPILLARY     Status: Abnormal   Collection Time   02/20/12 11:48 AM      Component Value Range Comment   Glucose-Capillary 320 (*) 70 - 99 (mg/dL)   GLUCOSE, CAPILLARY      Status: Abnormal   Collection Time   02/20/12  5:06 PM      Component Value Range Comment   Glucose-Capillary 285 (*) 70 - 99 (mg/dL)   GLUCOSE, CAPILLARY  Status: Abnormal   Collection Time   02/20/12  7:30 PM      Component Value Range Comment   Glucose-Capillary 134 (*) 70 - 99 (mg/dL)   GLUCOSE, CAPILLARY     Status: Abnormal   Collection Time   02/20/12  9:54 PM      Component Value Range Comment   Glucose-Capillary 247 (*) 70 - 99 (mg/dL)   GLUCOSE, CAPILLARY     Status: Normal   Collection Time   02/21/12  6:10 AM      Component Value Range Comment   Glucose-Capillary 97  70 - 99 (mg/dL)   GLUCOSE, CAPILLARY     Status: Abnormal   Collection Time   02/21/12 12:01 PM      Component Value Range Comment   Glucose-Capillary 199 (*) 70 - 99 (mg/dL)    Time was spent today discussing with the patient his current symptoms.  The patient reports that he continues to experience auditory hallucinations as well as visual hallucinations involving seeing a person and having a conversation with the person and then having the person disappear.  Treatment Plan Summary:  1. Daily contact with patient to assess and evaluate symptoms and progress in treatment  2. Medication management  3. The patient will deny suicidal ideations or homicidal ideations for 48 hours prior to discharge and have a depression and anxiety rating of 3 or less. The patient will also deny any auditory or visual hallucinations or delusional thinking.   Plan:  1. Will continue the patient on her current medications.  2. Will increase Seroquel to 200 mgs po q hs for sleep and psychosis. 3. Will add Neurontin 400 mgs po q 8 hours - prn for break thru anxiety. 4. Will continue to monitor.   Quaneshia Wareing 02/21/2012, 1:25 PM

## 2012-02-21 NOTE — Progress Notes (Signed)
Pt resting in bed with eyes closed.  No distress observed.  Safety maintained with q15 minute checks. 

## 2012-02-21 NOTE — Progress Notes (Signed)
02/21/2012  Time: 0930   Group Topic/Focus: The focus of this group is on discussing various aspects of wellness, balancing those aspects and exploring ways to increase the ability to experience wellness.   Participation Level:  Active  Participation Quality:  Attentive  Affect:  Blunted  Cognitive:  Alert   Additional Comments: None.   [name]  02/21/2012 1:07 PM  

## 2012-02-21 NOTE — Progress Notes (Signed)
[facility] Group Notes:  (Counselor/Nursing/MHT/Case Management/Adjunct)  02/21/2012 2:07 PM  Type of Therapy:  Group Therapy  Participation Level:  Minimal  Participation Quality:  Drowsy  Affect:  Blunted  Cognitive:  Oriented  Insight:  Limited  Engagement in Group:  Limited  Engagement in Therapy:  Limited  Modes of Intervention:  Education and Problem-solving  Summary of Progress/Problems: Patient was late for group. Only spoke up to say that the room was too hot. Did not participate in discussion and eventually fell asleep.   Reyne Falconi, Aram Beecham 02/21/2012, 2:07 PM

## 2012-02-22 MED ORDER — INSULIN ASPART 100 UNIT/ML ~~LOC~~ SOLN: 8.0000 [IU] | Freq: Three times a day (TID) | SUBCUTANEOUS | Status: DC

## 2012-02-22 MED ORDER — DOXYCYCLINE HYCLATE 100 MG PO CAPS
200.0000 mg | ORAL_CAPSULE | Freq: Two times a day (BID) | ORAL | Status: DC
  Filled 2012-02-22 (×2): qty 2

## 2012-02-22 MED ORDER — SERTRALINE HCL 50 MG PO TABS: 50.0000 mg | ORAL_TABLET | Freq: Every day | ORAL | Status: DC

## 2012-02-22 MED ORDER — CITALOPRAM HYDROBROMIDE 20 MG PO TABS: ORAL_TABLET | ORAL | Status: DC

## 2012-02-22 MED ORDER — DOXYCYCLINE HYCLATE 100 MG PO CAPS: 100.0000 mg | ORAL_CAPSULE | Freq: Two times a day (BID) | ORAL | Status: DC

## 2012-02-22 MED ORDER — IBUPROFEN 800 MG PO TABS: 800.0000 mg | ORAL_TABLET | Freq: Three times a day (TID) | ORAL | Status: DC | PRN

## 2012-02-22 MED ORDER — TRAZODONE HCL 100 MG PO TABS: 200.0000 mg | ORAL_TABLET | Freq: Every day | ORAL | Status: DC

## 2012-02-22 MED ORDER — INSULIN ASPART 100 UNIT/ML ~~LOC~~ SOLN: SUBCUTANEOUS | Status: DC

## 2012-02-22 MED ORDER — QUETIAPINE FUMARATE 200 MG PO TABS: 200.0000 mg | ORAL_TABLET | Freq: Every day | ORAL | Status: DC

## 2012-02-22 MED ORDER — CLONAZEPAM 1 MG PO TABS: 1.0000 mg | ORAL_TABLET | ORAL | Status: DC

## 2012-02-22 MED ORDER — INSULIN GLARGINE 100 UNIT/ML ~~LOC~~ SOLN: 30.0000 [IU] | Freq: Every day | SUBCUTANEOUS | Status: DC

## 2012-02-22 MED ORDER — BENZTROPINE MESYLATE 1 MG PO TABS: 1.0000 mg | ORAL_TABLET | Freq: Two times a day (BID) | ORAL | Status: DC

## 2012-02-22 MED ORDER — FLUPHENAZINE HCL 5 MG PO TABS: 5.0000 mg | ORAL_TABLET | ORAL | Status: DC

## 2012-02-22 MED ORDER — CITALOPRAM HYDROBROMIDE 40 MG PO TABS: ORAL_TABLET | ORAL | Status: DC

## 2012-02-22 MED ORDER — INSULIN ASPART 100 UNIT/ML ~~LOC~~ SOLN: 0.0000 [IU] | Freq: Three times a day (TID) | SUBCUTANEOUS | Status: DC

## 2012-02-22 MED ORDER — DOXYCYCLINE HYCLATE 100 MG PO CAPS
100.0000 mg | ORAL_CAPSULE | Freq: Two times a day (BID) | ORAL | Status: DC
  Filled 2012-02-22 (×2): qty 1

## 2012-02-22 MED ORDER — DOXYCYCLINE HYCLATE 100 MG PO CAPS: 200.0000 mg | ORAL_CAPSULE | Freq: Two times a day (BID) | ORAL | Status: AC

## 2012-02-22 NOTE — Progress Notes (Signed)
Subjective:   Denies complaints.   Diabetic since age 26 yrs. Has been on Lantus 25 units at bedtime, Novolog 10 units tid with meals and SSI prior to admission. Says has no place to follow up or get insulins on discharge. Blood sugars at home range in 250-300 mg/dl. Non compliant with diet.   Objective  Vital signs in last 24 hours: Filed Vitals:   02/21/12 1230 02/21/12 1231 02/22/12 0532 02/22/12 0535  BP: 106/68 113/70 118/74 115/67  Pulse: 90 101 102 98  Temp:   97.3 F (36.3 C)   TempSrc:   Oral   Resp:   20   Height:      Weight:       Weight change:  No intake or output data in the 24 hours ending 02/22/12 0836  Physical Exam:  General Exam: Comfortable. Ambulating. Respiratory System: Clear. No increased work of breathing.  Cardiovascular System: First and second heart sounds heard. Regular rate and rhythm. No JVD/murmurs.  Gastrointestinal System: Abdomen is non distended, soft and normal bowel sounds heard.  Central Nervous System: Alert and oriented. No focal neurological deficits. Extremities: multiple healed scars on forearms.  Labs:  Basic Metabolic Panel:  Lab 02/20/12 7829  NA 137  K 4.2  CL 100  CO2 32  GLUCOSE 187*  BUN 17  CREATININE 0.49*  CALCIUM 9.2  ALB --  PHOS --   Liver Function Tests: No results found for this basename: AST:3,ALT:3,ALKPHOS:3,BILITOT:3,PROT:3,ALBUMIN:3 in the last 168 hours No results found for this basename: LIPASE:3,AMYLASE:3 in the last 168 hours No results found for this basename: AMMONIA:3 in the last 168 hours CBC:  Lab 02/19/12 0630  WBC 7.0  NEUTROABS 4.2  HGB 16.1  HCT 44.3  MCV 87.2  PLT 289   Cardiac Enzymes: No results found for this basename: CKTOTAL:5,CKMB:5,CKMBINDEX:5,TROPONINI:5 in the last 168 hours CBG:  Lab 02/22/12 0633 02/21/12 2145 02/21/12 1841 02/21/12 1700 02/21/12 1657  GLUCAP 226* 241* 267* 430* 420*    Iron Studies: No results found for this basename:  IRON,TIBC,TRANSFERRIN,FERRITIN in the last 72 hours Studies/Results: No results found. Medications:      . benztropine  1 mg Oral BH-qamhs  . clonazePAM  1 mg Oral BH-q8a2phs  . doxycycline  200 mg Oral Q12H  . fluPHENAZine  5 mg Oral BH-qamhs  . insulin aspart  0-20 Units Subcutaneous TID WC  . insulin aspart  0-5 Units Subcutaneous QHS  . insulin aspart  20 Units Subcutaneous Once  . insulin aspart  8 Units Subcutaneous TID WC  . insulin glargine  35 Units Subcutaneous QHS  . nicotine  21 mg Transdermal Q0600  . QUEtiapine  200 mg Oral QHS  . sertraline  50 mg Oral Daily  . traZODone  200 mg Oral QHS  . DISCONTD: QUEtiapine  100 mg Oral QHS    I  have reviewed scheduled and prn medications.     Problem/Plan: Principal Problem:  *Schizophrenia, undifferentiated Active Problems:  Generalized anxiety disorder  Diabetes type 1, uncontrolled  1. Uncontrolled DM 1: Better control (CBG's ranging b/w 241-267 mg/dl since last pm). Patient says that he has applied for Medicaid and should be approved in next couple of weeks. Will await Diabetes coordinator to meet with patient to discuss treatment/insulin choices.Stressed compliance.  2. Schizophrenia: Management per primary service. 3. Tobacco Abuse  Discussed with Dr. Allena Katz who plans for possible discharge tomorrow.  [name] 02/22/2012,8:36 AM  LOS: 5 days

## 2012-02-22 NOTE — Progress Notes (Signed)
Pt's mood is anxious, intrusive and demanding. Pt became upset when another pt received her medication before him and pt started cursing and calling the pt names. Pt redirected multiple times and asked to return to his room. Pt went to his room and came back to medication window a few minutes later not as irritable and took medication. Offered support, encouragement and 15 minute checks. Gave prn medication as requested and ordered. Safety maintained on unit.

## 2012-02-22 NOTE — [facility] Suicide Risk Assessment (Signed)
Suicide Risk Assessment  Discharge Assessment     Demographic factors:  Assessment Details Time of Assessment: Admission Information Obtained From: Patient Current Mental Status:  Current Mental Status: Suicidal ideation indicated by patient Risk Reduction Factors:  Risk Reduction Factors: Sense of responsibility to family  CLINICAL FACTORS:   Severe Anxiety and/or Agitation Schizophrenia:   Paranoid or undifferentiated type  COGNITIVE FEATURES THAT CONTRIBUTE TO RISK:  None Noted.    Diagnosis:  Axis I: Schizophrenia - Undifferentiated Type.  Generalized Anxiety Disorder.   The patient was seen today and reports the following:   ADL's: Intact.  Sleep: The patient reports to sleeping well last night without difficulty.  Appetite: The patient reports a good appetite today.   Mild>(1-10) >Severe  Hopelessness (1-10): 5 (due to not knowing what my life holds for me.) Depression (1-10): 0  Anxiety (1-10): 7 (due to worrying about my future.)  Suicidal Ideation: The patient adamantly denies any suicidal ideations today.  Plan: No  Intent: No  Means: No  Homicidal Ideation: The patient adamantly denies any homicidal ideations today.  Plan: No  Intent: No.  Means: No   General Appearance/Behavior: The patient continue to be friendly and cooperative with treatment team.  Eye Contact: Good.  Speech: Appropriate in rate and volume with no pressuring noted.  Motor Behavior: wnl.  Level of Consciousness: Alert and Oriented x 3.  Mental Status: Alert and Oriented x 3.  Mood: Euthymic today. Affect: Mildly constricted.  Anxiety Level: Moderate anxiety reported.  Thought Process: wnl.  Thought Content: The patient reports very mild auditory hallucinations but denies any visual hallucinations or delusional thinking.  Perception:. wnl .  Judgment: Fair to Good.  Insight: Fair to Good.  Cognition: Oriented to time, place and person.   Time was spent today discussing with the  patient his current symptoms. The patient reports that he continues to experience very mild auditory hallucinations but denies any visual hallucinations or delusional thinking.  He states that he can ignore the voices and would like to be discharged today.  The patient reports that he plans to return to his apartment with his roommate and will see his ACT team tomorrow for continued mental health follow up.  Treatment Plan Summary:  1. Daily contact with patient to assess and evaluate symptoms and progress in treatment  2. Medication management  3. The patient will deny suicidal ideations or homicidal ideations for 48 hours prior to discharge and have a depression and anxiety rating of 3 or less. The patient will also deny any auditory or visual hallucinations or delusional thinking.   Plan:  1. Will continue the patient on her current medications.  2. Will continue to monitor.  3. Will discharge today to outpatient follow up.  SUICIDE RISK:   Minimal: No identifiable suicidal ideation.  Patients presenting with no risk factors but with morbid ruminations; may be classified as minimal risk based on the severity of the depressive symptoms  [name] 02/22/2012, 11:56 AM

## 2012-02-22 NOTE — Progress Notes (Signed)
Pt resting in bed with eyes closed.  No distress observed.  Safety maintained with q15 minute checks. 

## 2012-02-22 NOTE — Progress Notes (Signed)
Pt d/c from hospital with bus pass. All items returned. D/C instructions given for follow up appointment and insulin instructions given. Insulin pens given, samples given and prescriptions given. Pt denies si and hi.

## 2012-02-22 NOTE — Tx Team (Signed)
Interdisciplinary Treatment Plan Update (Adult)  Date:  02/22/2012  Time Reviewed:  10:35 AM   Progress in Treatment: Attending groups:   Yes   Participating in groups:  Yes Taking medication as prescribed:  Yes Tolerating medication:  Yes Family/Significant othe contact made: Refused Patient understands diagnosis:  Yes Discussing patient identified problems/goals with staff: Yes Medical problems stabilized or resolved: Yes Denies suicidal/homicidal ideation:Yes Issues/concerns per patient self-inventory: None Other:  New problem(s) identified:  N/A  Reason for Continuation of Hospitalization: None  Interventions implemented related to continuation of hospitalization:  15 min. Checks until discharge  Medication Management; safety checks q 15 mins  Additional comments:  None  Estimated length of stay:  Discharge today  Discharge Plan:  Would like to return to roommate, but if roommate will not agree will go to homeless shelter.  [facility] appointment is set up, but patient also has [facility].  This is confirmed in Treatment Team, and patient will be seen by ACTT.  New goal(s):  None  Review of initial/current patient goals per problem list:   1.  Goal(s):  Eliminate/Reduce Psychosis (A/H)  Met:  Yes  Target date: d/c  As evidenced by:  [name] will report return to baseline - no psychosis or less frequent occurrences  2.  Goal (s):  Reduce Si/Thoughts of self ham  Met:  Yes  Target date: d/c  As evidenced by: [name] denies SI or other thoughts of self-harm  3.  Goal(s):  Stabilization on medications  Met:  Yes  Target date: d./c  As evidenced by:  Patient will report medications are working  4.  Goal(s): Schedule outpatient follow up  Met:  Yes  Target date: d/c  As evidenced by:  Follow up appointment scheduled  Attendees: Patient:  [name]  02/22/2012 10:35 AM   Family:     Physician:  Franchot Gallo, MD 02/22/2012 10:34 AM   Nursing:   Edwyna Shell, RN 02/22/2012 10:34 AM   CaseManager:  Juline Patch, LCSW 02/22/2012 10:34 AM   Counselor: Albertine Patricia, MT 02/22/2012 10:34 AM   Other:   Lynann Bologna, NP 02/22/2012 10:34 AM   Other: Ambrose Mantle, LCSW 02/22/2012 10:34AM  Other:     Other:      Scribe for Treatment Team:   Wynn Banker, LCSW,  02/22/2012 10:34 AM

## 2012-02-23 ENCOUNTER — Encounter (HOSPITAL_COMMUNITY): Payer: Self-pay | Admitting: Orthopedic Surgery

## 2012-02-23 NOTE — Discharge Summary (Signed)
Physician Discharge Summary Note  Patient:  [name] is an 26 y.o., male MRN:  [id number] DOB:  [birth year] Patient phone:  [phone] (home)  Patient address:   [address] [place] [address],   Date of Admission:  02/17/2012 Date of Discharge: 02/22/2012  Discharge Diagnoses: Principal Problem:  *Schizophrenia, undifferentiated Active Problems:  Generalized anxiety disorder  Diabetes type 1, uncontrolled  Axis Diagnosis:   AXIS I:  Schizophrenia, undifferentiated type; Methamphetamine Abuse in 7 month remission; Cannabis Abuse; Generalized Anxiety disorder AXIS II:  deferred AXIS III:  DM II uncontrolled Past Medical History  Diagnosis Date  . Diabetes mellitus   . Depression   . Schizo affective schizophrenia    AXIS IV:  housing problems; poor social supports AXIS V:  61-70 mild symptoms  Level of Care:  OP  Hospital Course:  [name] presented with an exacerbation of his schizophrenia. He was seeing bugs crawling on himself, and also having some auditory hallucinations, complained of some depression along with this. He previously done well on Prolixin. He had had no insurance for some time, and had been unable to afford any medications, including his medications for diabetes.  He was admitted to our acute stabilization unit and started back on Prolixin, Celexa was added to address his depression symptoms. Seroquel was ultimately added to help with sleep.   Diabetes was uncontrolled with CBGs greater than 400, and internal medicine was called to manage this. He was pleasant and cooperative on the unit. 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 Received hydrocodone for pain.  By March 7 he was in full contact with reality, denying any suicidal thoughts had slept well, and no hallucinations. He was discharged with a 14 day supply of insulin, and other  medications.  Consults:  Internal Medicine for Diabetes uncontrolled, insulin dosage adjustments made.  Significant Diagnostic Studies:  HgbA1c 11.9; UDS positive for cannabis metabolites; TSH .428, Free T4 1.58; RPR nonreactive.    Discharge Vitals:   Blood pressure 115/67, pulse 98, temperature 97.3 F (36.3 C), temperature source Oral, resp. rate 20, height 5' 9.5" (1.765 m), weight 59.875 kg (132 lb).  Mental Status Exam: See Mental Status Examination and Suicide Risk Assessment completed by Attending Physician prior to discharge.  Discharge destination:  Home  Is patient on multiple antipsychotic therapies at discharge:  Yes  Has Patient had three or more failed trials of antipsychotic monotherapy by history: Yes, including Risperdal, Prolixin, and Seroquel.  Recommended Plan for Multiple Antipsychotic Therapies: Outpatient provider may want to consider taper/discontinue Seroquel at night and monotherapy with Prolixin only.   Discharge Orders    Future Orders Please Complete By Expires   Nursing communication      Scheduling Instructions:   At discharge RN please dispense patient's lantus and novolog flex pens and give instructions.  Thank you.     Medication List  As of 02/23/2012  3:52 PM   STOP taking these medications         citalopram 20 MG tablet      HYDROcodone-acetaminophen 5-325 MG per tablet      insulin lispro 100 UNIT/ML injection         TAKE these medications      Indication    benztropine 1 MG tablet   Commonly known as: COGENTIN   Take 1 tablet (1 mg total) by mouth 2 (two) times daily. To  prevent EPS symptoms.       clonazePAM 1 MG tablet   Commonly known as: KLONOPIN   Take 1 tablet (1 mg total) by mouth 3 (three) times daily at 8am, 2pm and bedtime. For anxiety.       doxycycline 100 MG capsule   Commonly known as: VIBRAMYCIN   Take 2 capsules (200 mg total) by mouth every 12 (twelve) hours. For L Thumb infection.  Follow up with Dr. Stefano Gaul MD for this.       fluPHENAZine 5 MG tablet   Commonly known as: PROLIXIN   Take 1 tablet (5 mg total) by mouth 2 (two) times daily in the am and at bedtime.. For psychosis/voices.       ibuprofen 800 MG tablet   Commonly known as: ADVIL,MOTRIN   Take 1 tablet (800 mg total) by mouth 3 (three) times daily as needed for pain (for L thumb pain. ).       insulin aspart 100 UNIT/ML injection   Commonly known as: novoLOG   Inject 10 units subcutaneous, three times daily with meals. Use this dose only when you eat. This is a short-acting insulin for meal coverage, for diabetes.       insulin aspart 100 UNIT/ML injection   Commonly known as: novoLOG   Inject 0-20 units into the skin three times daily with meals, in addition to the meal coverage insulin. For diabetes control. See the sliding scale written on your prescription.       insulin glargine 100 UNIT/ML injection   Commonly known as: LANTUS   Inject 30 Units into the skin at bedtime. Long acting insulin for diabetes control       QUEtiapine 200 MG tablet   Commonly known as: SEROQUEL   Take 1 tablet (200 mg total) by mouth at bedtime. Helps with psychosis/voices, and sleep.       sertraline 50 MG tablet   Commonly known as: ZOLOFT   Take 1 tablet (50 mg total) by mouth daily. For depression.       traZODone 100 MG tablet   Commonly known as: DESYREL   Take 2 tablets (200 mg total) by mouth at bedtime. For sleep            Follow-up Information    Follow up with [facility]  on 02/23/2012. (You will be seen by [facility] between 1:00 and 3:00 p.m. on Friday, February 23, 2012)    Contact information:   [phone]      Follow up with community clinic in [place]. (for Diabetes Follow-up)    Contact information:   Call for appointment [phone], 1-4pm [facility] is no longer taking eligibility applications.          Follow-up recommendations:  Activity:  unrestricted Diet:  regular  [name] has a scheduled appointment with Christopher Bienenstock MD, orthopedist, Friday 3/8 at 9:30am for follow up of infected L thumb.   Signed: SCOTT,MARGARET A 02/23/2012, 3:52 PM

## 2012-02-25 ENCOUNTER — Encounter (HOSPITAL_COMMUNITY): Payer: Self-pay | Admitting: Family Medicine

## 2012-02-25 ENCOUNTER — Emergency Department (HOSPITAL_COMMUNITY): Payer: Medicare Other

## 2012-02-25 ENCOUNTER — Inpatient Hospital Stay (HOSPITAL_COMMUNITY)
Admission: EM | Admit: 2012-02-25 | Discharge: 2012-02-26 | DRG: 639 | Disposition: A | Discharge status: Home / Self Care | Payer: Medicare Other | Attending: Family Medicine | Admitting: Family Medicine

## 2012-02-25 DIAGNOSIS — Z794 Long term (current) use of insulin: Secondary | ICD-10-CM

## 2012-02-25 DIAGNOSIS — F1910 Other psychoactive substance abuse, uncomplicated: Secondary | ICD-10-CM | POA: Diagnosis present

## 2012-02-25 DIAGNOSIS — F209 Schizophrenia, unspecified: Secondary | ICD-10-CM | POA: Diagnosis present

## 2012-02-25 DIAGNOSIS — E1110 Type 2 diabetes mellitus with ketoacidosis without coma: Secondary | ICD-10-CM

## 2012-02-25 DIAGNOSIS — Z91199 Patient's noncompliance with other medical treatment and regimen due to unspecified reason: Secondary | ICD-10-CM

## 2012-02-25 DIAGNOSIS — Z9119 Patient's noncompliance with other medical treatment and regimen: Secondary | ICD-10-CM

## 2012-02-25 DIAGNOSIS — E1010 Type 1 diabetes mellitus with ketoacidosis without coma: Principal | ICD-10-CM | POA: Diagnosis present

## 2012-02-25 DIAGNOSIS — E860 Dehydration: Secondary | ICD-10-CM | POA: Diagnosis present

## 2012-02-25 HISTORY — DX: Other psychoactive substance abuse, uncomplicated: F19.10

## 2012-02-25 LAB — CBC
HCT: 37.9 % — ABNORMAL LOW (ref 39.0–52.0)
Hemoglobin: 14.7 g/dL (ref 13.0–17.0)
Hemoglobin: 13.7 g/dL (ref 13.0–17.0)
MCH: 31.4 pg (ref 26.0–34.0)
MCHC: 36.1 g/dL — ABNORMAL HIGH (ref 30.0–36.0)
MCV: 86.7 fL (ref 78.0–100.0)
RBC: 4.73 MIL/uL (ref 4.22–5.81)
RBC: 4.37 MIL/uL (ref 4.22–5.81)
WBC: 6.1 10*3/uL (ref 4.0–10.5)

## 2012-02-25 LAB — MRSA PCR SCREENING: MRSA by PCR: NEGATIVE

## 2012-02-25 LAB — POCT I-STAT, CHEM 8
BUN: 17 mg/dL (ref 6–23)
Chloride: 95 mEq/L — ABNORMAL LOW (ref 96–112)
HCT: 45 % (ref 39.0–52.0)
Sodium: 132 mEq/L — ABNORMAL LOW (ref 135–145)
TCO2: 25 mmol/L (ref 0–100)

## 2012-02-25 LAB — GLUCOSE, CAPILLARY
Glucose-Capillary: >600 mg/dL (ref 70–99)
Glucose-Capillary: 335 mg/dL — ABNORMAL HIGH (ref 70–99)
Glucose-Capillary: 79 mg/dL (ref 70–99)
Glucose-Capillary: 117 mg/dL — ABNORMAL HIGH (ref 70–99)

## 2012-02-25 LAB — RAPID URINE DRUG SCREEN, HOSP PERFORMED
Amphetamines: NOT DETECTED
Barbiturates: NOT DETECTED
Opiates: NOT DETECTED
Tetrahydrocannabinol: NOT DETECTED

## 2012-02-25 LAB — BASIC METABOLIC PANEL
BUN: 12 mg/dL (ref 6–23)
CO2: 29 mEq/L (ref 19–32)
GFR calc Af Amer: >90 mL/min (ref >90)
GFR calc non Af Amer: >90 mL/min (ref >90)
GFR calc non Af Amer: >90 mL/min (ref >90)
Glucose, Bld: 871 mg/dL (ref 70–99)
Glucose, Bld: 91 mg/dL (ref 70–99)
Potassium: 4.3 mEq/L (ref 3.5–5.1)
Potassium: 3.4 mEq/L — ABNORMAL LOW (ref 3.5–5.1)
Sodium: 129 mEq/L — ABNORMAL LOW (ref 135–145)

## 2012-02-25 LAB — BLOOD GAS, VENOUS
FIO2: 0.21 %
O2 Saturation: 95.6 %
pO2, Ven: 84.3 mmHg — ABNORMAL HIGH (ref 30.0–45.0)

## 2012-02-25 LAB — URINALYSIS, ROUTINE W REFLEX MICROSCOPIC
Glucose, UA: >1000 mg/dL — AB
Hgb urine dipstick: NEGATIVE
Leukocytes, UA: NEGATIVE
Specific Gravity, Urine: 1.005 (ref 1.005–1.030)
Urobilinogen, UA: 0.2 mg/dL (ref 0.0–1.0)

## 2012-02-25 LAB — DIFFERENTIAL
Lymphocytes Relative: 20 % (ref 12–46)
Lymphs Abs: 1.2 10*3/uL (ref 0.7–4.0)
Monocytes Relative: 7 % (ref 3–12)
Neutro Abs: 4.4 10*3/uL (ref 1.7–7.7)
Neutrophils Relative %: 72 % (ref 43–77)

## 2012-02-25 LAB — KETONES, QUALITATIVE: Acetone, Bld: NEGATIVE

## 2012-02-25 MED ORDER — SODIUM CHLORIDE 0.9 % IV BOLUS (SEPSIS): 1000.0000 mL | Freq: Once | INTRAVENOUS | Status: DC

## 2012-02-25 MED ORDER — SERTRALINE HCL 50 MG PO TABS
50.0000 mg | ORAL_TABLET | Freq: Every day | ORAL | Status: DC
  Administered 2012-02-25 – 2012-02-26 (×2): 50 mg via ORAL
  Filled 2012-02-25 (×2): qty 1

## 2012-02-25 MED ORDER — INSULIN ASPART 100 UNIT/ML ~~LOC~~ SOLN: 0.0000 [IU] | Freq: Every day | SUBCUTANEOUS | Status: DC

## 2012-02-25 MED ORDER — SODIUM CHLORIDE 0.9 % IV SOLN
INTRAVENOUS | Status: DC
  Administered 2012-02-25: 13:00:00 via INTRAVENOUS

## 2012-02-25 MED ORDER — SODIUM CHLORIDE 0.9 % IV SOLN
INTRAVENOUS | Status: DC
  Administered 2012-02-25: 5.4 [IU]/h via INTRAVENOUS
  Filled 2012-02-25: qty 1

## 2012-02-25 MED ORDER — TRAZODONE HCL 100 MG PO TABS
200.0000 mg | ORAL_TABLET | Freq: Every day | ORAL | Status: DC
  Administered 2012-02-25: 200 mg via ORAL
  Filled 2012-02-25 (×2): qty 2

## 2012-02-25 MED ORDER — ONDANSETRON HCL 4 MG/2ML IJ SOLN: 4.0000 mg | Freq: Three times a day (TID) | INTRAMUSCULAR | Status: DC | PRN

## 2012-02-25 MED ORDER — QUETIAPINE FUMARATE 200 MG PO TABS
200.0000 mg | ORAL_TABLET | Freq: Every day | ORAL | Status: DC
  Administered 2012-02-25: 200 mg via ORAL
  Filled 2012-02-25 (×2): qty 1

## 2012-02-25 MED ORDER — SODIUM CHLORIDE 0.9 % IV SOLN
INTRAVENOUS | Status: DC
  Administered 2012-02-26: 75 mL/h via INTRAVENOUS

## 2012-02-25 MED ORDER — SODIUM CHLORIDE 0.9 % IV SOLN
1000.0000 mL | Freq: Once | INTRAVENOUS | Status: AC
  Administered 2012-02-25: 1000 mL via INTRAVENOUS

## 2012-02-25 MED ORDER — CITALOPRAM HYDROBROMIDE 20 MG PO TABS
20.0000 mg | ORAL_TABLET | Freq: Every day | ORAL | Status: DC
  Filled 2012-02-25 (×2): qty 1

## 2012-02-25 MED ORDER — SODIUM CHLORIDE 0.9 % IV SOLN
INTRAVENOUS | Status: DC
  Administered 2012-02-25: 17:00:00 via INTRAVENOUS

## 2012-02-25 MED ORDER — BENZTROPINE MESYLATE 1 MG PO TABS
1.0000 mg | ORAL_TABLET | Freq: Two times a day (BID) | ORAL | Status: DC
  Administered 2012-02-25 – 2012-02-26 (×2): 1 mg via ORAL
  Filled 2012-02-25 (×3): qty 1

## 2012-02-25 MED ORDER — DEXTROSE-NACL 5-0.45 % IV SOLN: INTRAVENOUS | Status: DC | PRN

## 2012-02-25 MED ORDER — POTASSIUM CHLORIDE CRYS ER 20 MEQ PO TBCR
40.0000 meq | EXTENDED_RELEASE_TABLET | Freq: Two times a day (BID) | ORAL | Status: DC
  Administered 2012-02-25 – 2012-02-26 (×2): 40 meq via ORAL
  Filled 2012-02-25 (×3): qty 2

## 2012-02-25 MED ORDER — INSULIN ASPART 100 UNIT/ML ~~LOC~~ SOLN
8.0000 [IU] | Freq: Once | SUBCUTANEOUS | Status: AC
  Administered 2012-02-25: 8 [IU] via SUBCUTANEOUS

## 2012-02-25 MED ORDER — LORAZEPAM 2 MG/ML IJ SOLN
2.0000 mg | Freq: Once | INTRAMUSCULAR | Status: AC
  Administered 2012-02-25: 2 mg via INTRAVENOUS
  Filled 2012-02-25: qty 1

## 2012-02-25 MED ORDER — CLONAZEPAM 1 MG PO TABS
1.0000 mg | ORAL_TABLET | ORAL | Status: DC
  Administered 2012-02-25 – 2012-02-26 (×2): 1 mg via ORAL
  Filled 2012-02-25 (×2): qty 1

## 2012-02-25 MED ORDER — HALOPERIDOL LACTATE 5 MG/ML IJ SOLN
INTRAMUSCULAR | Status: AC
  Filled 2012-02-25: qty 1

## 2012-02-25 MED ORDER — HALOPERIDOL LACTATE 5 MG/ML IJ SOLN
5.0000 mg | Freq: Once | INTRAMUSCULAR | Status: AC
  Administered 2012-02-25: 5 mg via INTRAVENOUS

## 2012-02-25 MED ORDER — INSULIN GLARGINE 100 UNIT/ML ~~LOC~~ SOLN
20.0000 [IU] | Freq: Every day | SUBCUTANEOUS | Status: DC
  Administered 2012-02-25: 20 [IU] via SUBCUTANEOUS
  Filled 2012-02-25: qty 3

## 2012-02-25 MED ORDER — SODIUM CHLORIDE 0.9 % IV SOLN
INTRAVENOUS | Status: DC
  Administered 2012-02-25: 16:00:00 via INTRAVENOUS
  Filled 2012-02-25: qty 1

## 2012-02-25 MED ORDER — DEXTROSE-NACL 5-0.45 % IV SOLN: INTRAVENOUS | Status: DC

## 2012-02-25 MED ORDER — FLUPHENAZINE HCL 5 MG PO TABS
5.0000 mg | ORAL_TABLET | Freq: Two times a day (BID) | ORAL | Status: DC
  Administered 2012-02-25 – 2012-02-26 (×2): 5 mg via ORAL
  Filled 2012-02-25 (×3): qty 1

## 2012-02-25 MED ORDER — DEXTROSE 50 % IV SOLN
25.0000 mL | INTRAVENOUS | Status: DC | PRN
  Filled 2012-02-25: qty 25

## 2012-02-25 MED ORDER — INSULIN ASPART 100 UNIT/ML ~~LOC~~ SOLN
0.0000 [IU] | Freq: Three times a day (TID) | SUBCUTANEOUS | Status: DC
  Administered 2012-02-26: 7 [IU] via SUBCUTANEOUS
  Administered 2012-02-26: 1 [IU] via SUBCUTANEOUS
  Filled 2012-02-25: qty 3

## 2012-02-25 MED ORDER — SODIUM CHLORIDE 0.9 % IV SOLN
1000.0000 mL | INTRAVENOUS | Status: DC
  Administered 2012-02-25: 1000 mL via INTRAVENOUS

## 2012-02-25 NOTE — H&P (Signed)
History and Physical  [name] LOV:[id number] DOB: [birth year] DOA: 02/25/2012  Referring physician: Glynn Octave, MD PCP: Sheila Oats, MD, MD   Chief Complaint: Hearing voices  HPI:  26 year old man with a history of schizophrenia who presented to the emergency department with complaint of hallucinations. He was found to be severely dehydrated and hyperglycemic. Further investigation suggested DKA the patient was referred for admission. The patient does endorse hallucinations but denies suicidal ideation. He reports not taking his insulin but he cannot tell me why.  Chart review: 02/17/2012-02/22/2012 behavioral health hospitalization: Schizophrenia, generalized anxiety disorder, methamphetamine abuse, cannabis abuse.  Review of Systems:  Negative for fever, changes to his vision, sore throat, rash, muscle aches, chest pain, shortness of breath, dysuria, bleeding, nausea, vomiting, abdominal pain.  Past Medical History  Diagnosis Date  . Diabetes mellitus   . Depression   . Schizo affective schizophrenia     Past Surgical History  Procedure Date  . No past surgeries   . I&d extremity 12/19/2011    Procedure: IRRIGATION AND DEBRIDEMENT EXTREMITY;  Surgeon: Sharma Covert;  Location: [facility] OR;  Service: Orthopedics;  Laterality: Left;   Social History:  does not have a smoking history on file. He does not have any smokeless tobacco history on file. His alcohol and drug histories not on file.  Allergies  Allergen Reactions  . Percocet (Oxycodone-Acetaminophen) Swelling  . Sulfa Antibiotics Other (See Comments)    Unknown - Childhood  . Sulfa Antibiotics Other (See Comments)    unknown   Family history: Patient denies any significant medical problems in his family.  Prior to Admission medications   Medication Sig Start Date End Date Taking? Authorizing Provider  benztropine (COGENTIN) 1 MG tablet Take 1 tablet (1 mg total) by mouth 2 (two) times daily. To prevent EPS  symptoms. 02/22/12  Yes Viviann Spare, NP  citalopram (CELEXA) 20 MG tablet Take 20 mg by mouth daily.   Yes Historical Provider, MD  clonazePAM (KLONOPIN) 1 MG tablet Take 1 tablet (1 mg total) by mouth 3 (three) times daily at 8am, 2pm and bedtime. For anxiety. 02/22/12 03/23/12 Yes Viviann Spare, NP  doxycycline (VIBRAMYCIN) 100 MG capsule Take 2 capsules (200 mg total) by mouth every 12 (twelve) hours. For L Thumb infection.  Follow up with Dr. Stefano Gaul MD for this. 02/22/12 03/03/12 Yes Curlene Labrum Readling, MD  fluPHENAZine (PROLIXIN) 5 MG tablet Take 1 tablet (5 mg total) by mouth 2 (two) times daily in the am and at bedtime.. For psychosis/voices. 02/22/12 03/23/12 Yes Viviann Spare, NP  ibuprofen (ADVIL,MOTRIN) 800 MG tablet Take 1 tablet (800 mg total) by mouth 3 (three) times daily as needed for pain (for L thumb pain. ). 02/22/12  Yes Viviann Spare, NP  insulin aspart (NOVOLOG) 100 UNIT/ML injection Inject 10 units subcutaneous, three times daily with meals. Use this dose only when you eat. This is a short-acting insulin for meal coverage, for diabetes. 02/22/12  Yes Viviann Spare, NP  insulin aspart (NOVOLOG) 100 UNIT/ML injection Inject 0-20 units into the skin three times daily with meals, in addition to the meal coverage insulin. For diabetes control. See the sliding scale written on your prescription. 02/22/12  Yes Viviann Spare, NP  insulin glargine (LANTUS) 100 UNIT/ML injection Inject 30 Units into the skin at bedtime. Long acting insulin for diabetes control 02/22/12  Yes Viviann Spare, NP  QUEtiapine (SEROQUEL) 200 MG tablet Take 1 tablet (200 mg total) by mouth  at bedtime. Helps with psychosis/voices, and sleep. 02/22/12 03/23/12 Yes Curlene Labrum Readling, MD  sertraline (ZOLOFT) 50 MG tablet Take 1 tablet (50 mg total) by mouth daily. For depression. 02/22/12 02/21/13 Yes Viviann Spare, NP  traZODone (DESYREL) 100 MG tablet Take 2 tablets (200 mg total) by mouth at bedtime. For sleep 02/22/12   Yes Viviann Spare, NP   Physical Exam: Filed Vitals:   02/25/12 1020 02/25/12 1026  BP: 126/82 116/78  Pulse: 102 96  Temp: 97.6 F (36.4 C)   TempSrc: Oral   Resp: 16 16  SpO2: 100% 100%     General:  Appears moderately ill. Alert [place] to voice and follows commands. Sits up in bed without assistance.  Eyes: Pupils equal, round, reactive to light. Normal lids, irises, conjunctiva.  ENT: Very dry mucous membranes. Grossly normal hearing. Fair dentition.  Neck: No lymphadenopathy or masses. No thyromegaly.  Cardiovascular: Regular rate and rhythm. No murmur, rub, gallop. No lower extremity edema  Respiratory: Clear to auscultation bilaterally. No wheezes, rales, rhonchi. Normal respiratory effort.  Abdomen: Soft, nontender, nondistended.  Skin: Some scratches on his arms. No significant lesions seen.  Musculoskeletal: Grossly normal tone and strength in his upper and lower extremities.  Psychiatric: Flat affect. Speech fluent and clear. Follows commands. Endorses hallucinations.  Neurologic: Grossly intact.  Labs on Admission:  Basic Metabolic Panel:  Lab 02/25/12 1610 02/25/12 1041 02/20/12 0630  NA 132* 129* 137  K 5.0 4.3 --  CL 95* 88* 100  CO2 -- 25 32  GLUCOSE >700* 871* 187*  BUN 17 16 17   CREATININE 0.60 0.58 0.49*  CALCIUM -- 9.3 9.2  MG -- -- --  PHOS -- -- --   CBC:  Lab 02/25/12 1108 02/25/12 1041 02/19/12 0630  WBC -- 6.1 7.0  NEUTROABS -- 4.4 4.2  HGB 15.3 14.7 16.1  HCT 45.0 43.4 44.3  MCV -- 91.8 87.2  PLT -- 257 289   CBG:  Lab 02/25/12 1024 02/22/12 1137 02/22/12 0633 02/21/12 2145 02/21/12 1841  GLUCAP >600* 205* 226* 241* 267*     Radiological Exams on Admission: Dg Chest 2 View  02/25/2012  *RADIOLOGY REPORT*  Clinical Data: Hyperglycemia.  Medical clearance.  CHEST - 2 VIEW  Comparison: 08/19/2011.  Findings: The cardiac silhouette, mediastinal and hilar contours are within normal limits and stable. The lungs are clear.  No  pleural effusions.  The bony thorax is intact  IMPRESSION: Normal chest x-ray.  No change since prior study.  Original Report Authenticated By: P. Loralie Champagne, M.D.    Assessment/Plan 1. DKA: Secondary to noncompliance. Patient alert and following commands. Will defer ABG. Aggressive IV fluids. Insulin infusion. Long-term compliance may be an issue as the patient was discharged with a 14 day supply 3 days ago and nevertheless did not take it. 2. Diabetes mellitus type 1, uncontrolled hemoglobin A1c 11.9: Plan as above. Transitional long-acting insulin when DKA resolved. 3. Hallucinations/schizophrenia: Continue psychotropics as reflected in the recent discharge summary. Psychiatry consult on patient medically stable for transfer to behavioral health. 4. History of polysubstance abuse: Check urine drug screen.    Code Status: Full code Family Communication: None at bedside Disposition Plan: Psychiatry evaluation when medically improved.  Brendia Sacks, MD  [facility] Pager [phone] 02/25/2012, 12:01 PM

## 2012-02-25 NOTE — ED Notes (Signed)
Pt requesting food.  Informed of his NPO status.

## 2012-02-25 NOTE — ED Provider Notes (Signed)
History     CSN: [id number]  Arrival date & time 02/25/12  1013   First MD Initiated Contact with Patient 02/25/12 1020      Chief Complaint  Patient presents with  . Hyperglycemia  . Medical Clearance    (Consider location/radiation/quality/duration/timing/severity/associated sxs/prior treatment) HPI Comments: Patient presents via EMS with hallucinations and hyperglycemia. He was recently discharged from behavioral health hospital for schizophrenia. He states he's been out of his insulin for a long time but he he was receiving an is in the hospital. Colostomy as well as yesterday afternoon. Sugars over 600 EMS arrival. He complains of dry mouth abdominal pain and nausea. He denies any chest pain or shortness of breath.  The history is provided by the patient and the EMS personnel.    Past Medical History  Diagnosis Date  . Diabetes mellitus   . Depression   . Schizo affective schizophrenia   . Polysubstance abuse     Past Surgical History  Procedure Date  . I&d extremity 12/19/2011    Procedure: IRRIGATION AND DEBRIDEMENT EXTREMITY;  Surgeon: Sharma Covert;  Location: [facility] OR;  Service: Orthopedics;  Laterality: Left;    No family history on file.  History  Substance Use Topics  . Smoking status: Not on file  . Smokeless tobacco: Not on file  . Alcohol Use:       Review of Systems  Constitutional: Positive for fatigue. Negative for fever, activity change and appetite change.  HENT: Negative for congestion and rhinorrhea.   Respiratory: Negative for cough and shortness of breath.   Cardiovascular: Negative for chest pain.  Gastrointestinal: Positive for nausea and abdominal pain. Negative for vomiting.  Genitourinary: Negative for flank pain.  Musculoskeletal: Negative for back pain.  Skin: Negative for rash.  Neurological: Positive for headaches.  Psychiatric/Behavioral: Positive for hallucinations, confusion, decreased concentration and agitation. The patient is  nervous/anxious.     Allergies  Percocet; Sulfa antibiotics; and Sulfa antibiotics  Home Medications   No current outpatient prescriptions on file.  BP 91/51  Pulse 74  Temp(Src) 98.1 F (36.7 C) (Oral)  Resp 16  Ht 6\' 2"  (1.88 m)  Wt 143 lb 8.3 oz (65.1 kg)  BMI 18.43 kg/m2  SpO2 98%  Physical Exam  Constitutional: He is oriented to person, place, and time. He appears well-developed and well-nourished. No distress.  HENT:  Head: Normocephalic and atraumatic.  Mouth/Throat: Oropharynx is clear and moist. No oropharyngeal exudate.       Very dry MM  Eyes: Conjunctivae are normal. Pupils are equal, round, and reactive to light.  Neck: Normal range of motion. Neck supple.  Cardiovascular: Normal rate, regular rhythm and normal heart sounds.   Pulmonary/Chest: Effort normal and breath sounds normal. No respiratory distress.  Abdominal: Soft. There is no tenderness. There is no rebound and no guarding.  Musculoskeletal: Normal range of motion. He exhibits no edema and no tenderness.  Neurological: He is alert and oriented to person, place, and time. No cranial nerve deficit.  Skin: Skin is warm.    ED Course  Procedures (including critical care time)  Labs Reviewed  GLUCOSE, CAPILLARY - Abnormal; Notable for the following:    Glucose-Capillary >600 (*)    All other components within normal limits  BASIC METABOLIC PANEL - Abnormal; Notable for the following:    Sodium 129 (*)    Chloride 88 (*)    Glucose, Bld 871 (*)    All other components within normal limits  URINALYSIS,  ROUTINE W REFLEX MICROSCOPIC - Abnormal; Notable for the following:    Color, Urine STRAW (*)    Glucose, UA >1000 (*)    All other components within normal limits  POCT I-STAT, CHEM 8 - Abnormal; Notable for the following:    Sodium 132 (*)    Chloride 95 (*)    Glucose, Bld >700 (*)    All other components within normal limits  BLOOD GAS, VENOUS - Abnormal; Notable for the following:    pCO2,  Ven 56.3 (*)    pO2, Ven 84.3 (*)    Bicarbonate 24.8 (*)    Acid-base deficit 2.8 (*)    All other components within normal limits  GLUCOSE, CAPILLARY - Abnormal; Notable for the following:    Glucose-Capillary 335 (*)    All other components within normal limits  GLUCOSE, CAPILLARY - Abnormal; Notable for the following:    Glucose-Capillary 125 (*)    All other components within normal limits  BASIC METABOLIC PANEL - Abnormal; Notable for the following:    Potassium 3.4 (*)    Creatinine, Ser 0.40 (*)    All other components within normal limits  CBC - Abnormal; Notable for the following:    HCT 37.9 (*)    MCHC 36.1 (*)    All other components within normal limits  GLUCOSE, CAPILLARY - Abnormal; Notable for the following:    Glucose-Capillary 117 (*)    All other components within normal limits  CBC  DIFFERENTIAL  KETONES, QUALITATIVE  URINE MICROSCOPIC-ADD ON  MRSA PCR SCREENING  GLUCOSE, CAPILLARY  KETONES, QUALITATIVE  BASIC METABOLIC PANEL  BASIC METABOLIC PANEL  BASIC METABOLIC PANEL  URINE RAPID DRUG SCREEN (HOSP PERFORMED)   Dg Chest 2 View  02/25/2012  *RADIOLOGY REPORT*  Clinical Data: Hyperglycemia.  Medical clearance.  CHEST - 2 VIEW  Comparison: 08/19/2011.  Findings: The cardiac silhouette, mediastinal and hilar contours are within normal limits and stable. The lungs are clear.  No pleural effusions.  The bony thorax is intact  IMPRESSION: Normal chest x-ray.  No change since prior study.  Original Report Authenticated By: P. Loralie Champagne, M.D.     1. DKA (diabetic ketoacidoses)       MDM  Hyperglycemia, noncompliance.  Hallucinations with history of schizophrenia.  Vitals stable. Abdomen soft and nontender.  R/o DKA.  Anion gap 17, bicarb 24.  No ketones yet.  Suspect early DKA. IVF, insulin gtt.  Patient demanding to be fed stating that he will leave if we do not feed him.  He is actively psychotic and hearing voices and seeing people who are not  there. I stated that he could not eat at this time. Ativan given for agitation and protection of patient and staff.  CRITICAL CARE Performed by: Glynn Octave   Total critical care time: 30  Critical care time was exclusive of separately billable procedures and treating other patients.  Critical care was necessary to treat or prevent imminent or life-threatening deterioration.  Critical care was time spent personally by me on the following activities: development of treatment plan with patient and/or surrogate as well as nursing, discussions with consultants, evaluation of patient's response to treatment, examination of patient, obtaining history from patient or surrogate, ordering and performing treatments and interventions, ordering and review of laboratory studies, ordering and review of radiographic studies, pulse oximetry and re-evaluation of patient's condition.     Glynn Octave, MD 02/25/12 [phone]

## 2012-02-25 NOTE — ED Notes (Signed)
Per EMS: Pt called to be taken back to [facility] because he was still hearing voices and seeing people.  Pt is a diabetic.  Sugar was over 600.  Feels groggy.  Last insulin was given sometime yesterday.

## 2012-02-25 NOTE — ED Notes (Signed)
Attempted to call report to nurse.  Nurse will call back.

## 2012-02-25 NOTE — ED Notes (Signed)
Pt aware of the need for a urine sample, urinal at bedside. 

## 2012-02-25 NOTE — ED Notes (Signed)
Pt is cursing and yelling to give him food.  Dr. Manus Gunning brought in and explained importance of NPO status.  Yelling, "Give me some fucking food or I'm going to flip the fuck out".  When attempting to give pt ativan, pt stated, "I am not going to accept any medicine.  I hope you are expecting a fight when you try".  Dr. Manus Gunning brought in during administration of ativan.

## 2012-02-25 NOTE — ED Notes (Signed)
ZDG:UY40<HK> Expected date:02/25/12<BR> Expected time:10:10 AM<BR> Means of arrival:Ambulance<BR> Comments:<BR> Hyperglycemia V70.1 eval

## 2012-02-25 NOTE — ED Notes (Signed)
CBG registered high on ED Glucometer

## 2012-02-26 DIAGNOSIS — F209 Schizophrenia, unspecified: Secondary | ICD-10-CM

## 2012-02-26 DIAGNOSIS — F1910 Other psychoactive substance abuse, uncomplicated: Secondary | ICD-10-CM

## 2012-02-26 LAB — BASIC METABOLIC PANEL
BUN: 15 mg/dL (ref 6–23)
Chloride: 108 mEq/L (ref 96–112)
GFR calc Af Amer: >90 mL/min (ref >90)
Glucose, Bld: 118 mg/dL — ABNORMAL HIGH (ref 70–99)
Potassium: 3.6 mEq/L (ref 3.5–5.1)

## 2012-02-26 LAB — GLUCOSE, CAPILLARY: Glucose-Capillary: 333 mg/dL — ABNORMAL HIGH (ref 70–99)

## 2012-02-26 MED ORDER — INSULIN ASPART 100 UNIT/ML ~~LOC~~ SOLN: SUBCUTANEOUS | Status: DC

## 2012-02-26 MED ORDER — SODIUM CHLORIDE 0.9 % IV BOLUS (SEPSIS)
1000.0000 mL | Freq: Once | INTRAVENOUS | Status: AC
  Administered 2012-02-26: 1000 mL via INTRAVENOUS

## 2012-02-26 MED ORDER — INSULIN GLARGINE 100 UNIT/ML ~~LOC~~ SOLN: 30.0000 [IU] | Freq: Every day | SUBCUTANEOUS | Status: DC
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
## 2012-02-26 NOTE — Progress Notes (Signed)
Spoke with patient at bedside. Anxious to d/c home. States he lives alone, plan for getting meds was to go to ED. States he has enough meds at home to cover him for now. Discussed with him that he needs to establish with a PCP, provided information on [facility]. Instructed patient to contact them asap for f/u appt. Patient not very interested in anything but discharge. Requesting a bus pass, contacted CSW to arrange.

## 2012-02-26 NOTE — Progress Notes (Addendum)
PROGRESS NOTE  [name] JYN:[id number] DOB: [birth year] DOA: 02/25/2012 PCP: Sheila Oats, MD, MD  Brief narrative: 26 year old man with a history of schizophrenia who presented to the emergency department with complaint of hallucinations. He was found to be severely dehydrated and hyperglycemic. Further investigation suggested DKA the patient was referred for admission. The patient does endorse hallucinations but denies suicidal ideation. He reports not taking his insulin but he cannot tell me why.   Chart review:  02/17/2012-02/22/2012 behavioral health hospitalization: Schizophrenia, generalized anxiety disorder, methamphetamine abuse, cannabis abuse.   Past medical history: Diabetes mellitus type 1, depression, schizophrenia  Consultants:  Psychiatry  Procedures:  None  Antibiotics:  None  Interim History: Interval documentation reviewed.  Subjective: Patient feels well. Complains of continued hallucinations.  Objective: Filed Vitals:   02/25/12 1528 02/25/12 2047 02/26/12 0202 02/26/12 0521  BP: 91/51 98/60 89/53  95/61  Pulse: 74 92 86 85  Temp: 98.1 F (36.7 C) 98.4 F (36.9 C)  98.1 F (36.7 C)  TempSrc: Oral Oral  Oral  Resp: 16 16  18   Height: 6\' 2"  (1.88 m)     Weight: 65.1 kg (143 lb 8.3 oz)     SpO2: 98% 97%  97%    Intake/Output Summary (Last 24 hours) at 02/26/12 1007 Last data filed at 02/26/12 0552  Gross per 24 hour  Intake 5592.5 ml  Output      1 ml  Net 5591.5 ml    Exam:   General:  Appears calm and comfortable.  Cardiovascular: Regular rate and rhythm. No murmur, rub, gallop. No lower extremity edema.  Telemetry: Sinus rhythm. No acute changes.  Respiratory: Clear to auscultation bilaterally. No wheezes, rales, rhonchi. Normal respiratory effort.  Psychiatric: Calm.  Data Reviewed: Basic Metabolic Panel:  Lab 02/26/12 8657 02/25/12 1623 02/25/12 1108 02/25/12 1041 02/20/12 0630  NA 142 140 132* 129* 137  K 3.6  3.4* -- -- --  CL 108 104 95* 88* 100  CO2 29 29 -- 25 32  GLUCOSE 118* 91 >700* 871* 187*  BUN 15 12 17 16 17   CREATININE 0.46* 0.40* 0.60 0.58 0.49*  CALCIUM 8.3* 8.9 -- 9.3 9.2  MG -- -- -- -- --  PHOS -- -- -- -- --   CBC:  Lab 02/25/12 1623 02/25/12 1108 02/25/12 1041  WBC 8.1 -- 6.1  NEUTROABS -- -- 4.4  HGB 13.7 15.3 14.7  HCT 37.9* 45.0 43.4  MCV 86.7 -- 91.8  PLT 282 -- 257   CBG:  Lab 02/26/12 0735 02/25/12 2046 02/25/12 1803 02/25/12 1700 02/25/12 1549  GLUCAP 125* 380* 134* 117* 79    Recent Results (from the past 240 hour(s))  MRSA PCR SCREENING     Status: Normal   Collection Time   02/25/12  3:47 PM      Component Value Range Status Comment   MRSA by PCR NEGATIVE  NEGATIVE  Final      Studies: Dg Chest 2 View  02/25/2012  *RADIOLOGY REPORT*  Clinical Data: Hyperglycemia.  Medical clearance.  CHEST - 2 VIEW  Comparison: 08/19/2011.  Findings: The cardiac silhouette, mediastinal and hilar contours are within normal limits and stable. The lungs are clear.  No pleural effusions.  The bony thorax is intact  IMPRESSION: Normal chest x-ray.  No change since prior study.  Original Report Authenticated By: P. Loralie Champagne, M.D.    Scheduled Meds:   . sodium chloride  1,000 mL Intravenous Once  . benztropine  1 mg Oral BID  .  clonazePAM  1 mg Oral [id number]  . fluPHENAZine  5 mg Oral BID  . haloperidol lactate  5 mg Intravenous Once  . insulin aspart  0-5 Units Subcutaneous QHS  . insulin aspart  0-9 Units Subcutaneous TID WC  . insulin aspart  8 Units Subcutaneous Once  . insulin glargine  20 Units Subcutaneous QHS  . LORazepam  2 mg Intravenous Once  . potassium chloride  40 mEq Oral BID  . QUEtiapine  200 mg Oral QHS  . sertraline  50 mg Oral Daily  . sodium chloride  1,000 mL Intravenous Once  . traZODone  200 mg Oral QHS  . DISCONTD: sodium chloride   Intravenous STAT  . DISCONTD: citalopram  20 mg Oral Daily  . DISCONTD: sodium chloride  1,000 mL  Intravenous Once   Continuous Infusions:   . sodium chloride 75 mL/hr (02/26/12 0846)  . DISCONTD: sodium chloride 1,000 mL (02/25/12 1245)  . DISCONTD: sodium chloride 150 mL/hr at 02/25/12 1718  . DISCONTD: dextrose 5 % and 0.45% NaCl    . DISCONTD: dextrose 5 % and 0.45% NaCl    . DISCONTD: insulin (NOVOLIN-R) infusion 0.7 Units/hr (02/25/12 1345)  . DISCONTD: insulin (NOVOLIN-R) infusion       Assessment/Plan: 1. DKA: Resolved. Secondary to noncompliance. 2. Diabetes mellitus type 1, uncontrolled hemoglobin A1c 11.9: Blood sugars controlled. Continue Lantus and sliding scale insulin. Outpatient compliance is a clear issue. 3. Hallucinations/schizophrenia: Continue psychotropics as reflected in the recent discharge summary. Psychiatry consult. 4. History of polysubstance abuse: Urine drug screen positive for benzodiazepines, appropriate.  Patient is medically stable. Psychiatry consult for continued hallucinations. Stable for transfer to behavioral Health Center today if accepted.  Addendum 11:22 AM: Notified by RN that the patient would like to leave AMA. Patient was evaluated by psychiatry and cleared for discharge.  Code Status: Full code  Family Communication: None at bedside  Disposition Plan: Psychiatry evaluation when medically improved.    Brendia Sacks, MD  [facility] Pager [phone] 02/26/2012, 10:07 AM    LOS: 1 day

## 2012-02-26 NOTE — Progress Notes (Signed)
Patient Discharge Instructions:  Psychiatric Admission Assessment Note Faxed,  02/26/2012 Discharge Summary Note Faxed,   02/26/2012 After Visit Summary (AVS) Faxed,  02/26/2012 Face Sheet Faxed, 02/26/2012 Faxed to the Next Level Care provider:  02/26/2012  Faxed to [facility] @ [phone]  Wandra Scot, 02/26/2012, 3:59 PM

## 2012-02-26 NOTE — Progress Notes (Signed)
UR complete 

## 2012-02-26 NOTE — Consult Note (Signed)
Patient Identification:  [name] Date of Evaluation:  02/26/2012   History of Present Illness: Patient is 26 year old Caucasian male who was admitted on a medical floor due to noncompliance with his insulin. He was dehydrated and hyperglycemic. Patient was recently discharged from behavioral [facility]. She has history of schizophrenia and he was noncompliant with his medication. He was discharged on Prolixin and Seroquel. Patient was seen with social worker today. Patient told he was not taking his medication due to insurance reasons. Recently patient is approved and liked to keep his appointment with therapist. Patient admitted noncompliance with the medication caused increased hallucination and paranoia. However recent medication changes he is doing much better. He admitted having some mood swings and conflict with roommate but overall he denies any active suicidal thinking and homicidal thinking. He is followup at [facility] team in [place]. He reported no side effects of medication. He denies any tremors or shakes. He has no concern with her medication.  Past Psychiatric History: Patient has significant history of schizophrenia and polysubstance use. He has multiple hospitalization. He has tried to go antipsychotic medication including Risperdal Seroquel and Prolixin. Patient had a good response with combination of Prolixin and Seroquel. He's been seen with ACT team or passive months.   Past Medical History:     Past Medical History  Diagnosis Date  . Diabetes mellitus   . Depression   . Schizo affective schizophrenia   . Polysubstance abuse        Past Surgical History  Procedure Date  . I&d extremity 12/19/2011    Procedure: IRRIGATION AND DEBRIDEMENT EXTREMITY;  Surgeon: Sharma Covert;  Location: [facility] OR;  Service: Orthopedics;  Laterality: Left;    Allergies:  Allergies  Allergen Reactions  . Percocet (Oxycodone-Acetaminophen) Swelling  . Sulfa Antibiotics Other (See  Comments)    Unknown - Childhood  . Sulfa Antibiotics Other (See Comments)    unknown    Current Medications:  Prior to Admission medications   Medication Sig Start Date End Date Taking? Authorizing Provider  benztropine (COGENTIN) 1 MG tablet Take 1 tablet (1 mg total) by mouth 2 (two) times daily. To prevent EPS symptoms. 02/22/12  Yes Viviann Spare, NP  citalopram (CELEXA) 20 MG tablet Take 20 mg by mouth daily.   Yes Historical Provider, MD  clonazePAM (KLONOPIN) 1 MG tablet Take 1 tablet (1 mg total) by mouth 3 (three) times daily at 8am, 2pm and bedtime. For anxiety. 02/22/12 03/23/12 Yes Viviann Spare, NP  doxycycline (VIBRAMYCIN) 100 MG capsule Take 2 capsules (200 mg total) by mouth every 12 (twelve) hours. For L Thumb infection.  Follow up with Dr. Stefano Gaul MD for this. 02/22/12 03/03/12 Yes Curlene Labrum Readling, MD  fluPHENAZine (PROLIXIN) 5 MG tablet Take 1 tablet (5 mg total) by mouth 2 (two) times daily in the am and at bedtime.. For psychosis/voices. 02/22/12 03/23/12 Yes Viviann Spare, NP  ibuprofen (ADVIL,MOTRIN) 800 MG tablet Take 1 tablet (800 mg total) by mouth 3 (three) times daily as needed for pain (for L thumb pain. ). 02/22/12  Yes Viviann Spare, NP  insulin aspart (NOVOLOG) 100 UNIT/ML injection Inject 10 units subcutaneous, three times daily with meals. Use this dose only when you eat. This is a short-acting insulin for meal coverage, for diabetes. 02/22/12  Yes Viviann Spare, NP  insulin aspart (NOVOLOG) 100 UNIT/ML injection Inject 0-20 units into the skin three times daily with meals, in addition to the meal coverage insulin.  For diabetes control. See the sliding scale written on your prescription. 02/22/12  Yes Viviann Spare, NP  insulin glargine (LANTUS) 100 UNIT/ML injection Inject 30 Units into the skin at bedtime. Long acting insulin for diabetes control 02/22/12  Yes Viviann Spare, NP  QUEtiapine (SEROQUEL) 200 MG tablet Take 1 tablet (200 mg total) by mouth at  bedtime. Helps with psychosis/voices, and sleep. 02/22/12 03/23/12 Yes Curlene Labrum Readling, MD  sertraline (ZOLOFT) 50 MG tablet Take 1 tablet (50 mg total) by mouth daily. For depression. 02/22/12 02/21/13 Yes Viviann Spare, NP  traZODone (DESYREL) 100 MG tablet Take 2 tablets (200 mg total) by mouth at bedtime. For sleep 02/22/12  Yes Viviann Spare, NP    Social History:  He  does not have a smoking history on file. He does not have any smokeless tobacco history on file. His alcohol and drug histories not on file.   Family History:    No family history on file.  Mental Status Examination/Evaluation: Objective:  Appearance: Fairly Groomed  Psychomotor Activity:  Decreased  Eye Contact::  Fair  Speech:  Slow  Volume:  Decreased  Mood:  Anxious  Affect:  Appropriate  Thought Process:  Coherent  Orientation:  Full  Thought Content:  Auditory hallucinations  Suicidal Thoughts:  No  Homicidal Thoughts:  No  Judgement:  Good  Insight:  Good    DIAGNOSIS:   AXIS I   schizophrenia undifferentiated type   AXIS II  Deffered  AXIS III See medical notes.  AXIS IV economic problems, problems with access to health care services and problems with primary support group  AXIS V 61-70 mild symptoms     Assessment/Plan: Axis I schizophrenia paranoid type, polysubstance dependence Axis II deferred Axis III diabetes mellitus Axis IV mild to moderate  Plan At this time patient is now suicidal or homicidal. He is appropriate plan to followup with ACT team. He reported no side effects of medication. He likes to continue his medication which is helping his psychosis. Patient does not meet criteria for inpatient psychiatric treatment. Patient will be followup with ACT team this afternoon. Social worker will schedule the appointment. Patient is recommended to keep taking his medication regularly. Safety plan discuss that if he feel worsening of her symptoms or any time having suicidal thoughts or  homicidal thoughts that he need to call 911 or go to local ER

## 2012-02-26 NOTE — Discharge Summary (Signed)
Physician Discharge Summary  [name] KGM:[id number] DOB: January 18, 1986 DOA: 02/25/2012  PCP: Sheila Oats, MD, MD  Admit date: 02/25/2012 Discharge date: 02/26/2012  Discharge Diagnoses:  1. DKA, resolved 2. Diabetes mellitus type 1, uncontrolled 3. Hallucinations/schizophrenia 4. History of polysubstance abuse  Discharge Condition: Improved  Disposition: Home  History of present illness:  26 year old man with a history of schizophrenia who presented to the emergency department with complaint of hallucinations. He was found to be severely dehydrated and hyperglycemic. Further investigation suggested DKA the patient was referred for admission. The patient does endorse hallucinations but denies suicidal ideation. He reports not taking his insulin but he cannot tell me why.   Chart review:  02/17/2012-02/22/2012 behavioral health hospitalization: Schizophrenia, generalized anxiety disorder, methamphetamine abuse, cannabis abuse.   Hospital Course: Mr. Corbitt was admitted to the medical floor and treated for diabetic ketoacidosis. His condition quickly improved he is transitioned to subcutaneous insulin. The etiology of his DKA was noncompliance. He knows it is important to take his medications as directed. His blood sugars are stable for discharge. Patient complained of hallucinations and was seen in consultation with psychiatry. He was not felt to be a danger to self or others and was clear for discharge home.  1. DKA: Resolved. Secondary to noncompliance.  2. Diabetes mellitus type 1, uncontrolled hemoglobin A1c 11.9: Blood sugars controlled. Continue Lantus and sliding scale insulin. Outpatient compliance is a clear issue.  3. Hallucinations/schizophrenia: Continue psychotropics as reflected in the recent discharge summary. 4. History of polysubstance abuse: Urine drug screen positive for benzodiazepines, appropriate.  Consultants:  Psychiatry  Procedures:  None  Discharge  Instructions  Discharge Orders    Future Orders Please Complete By Expires   Diet Carb Modified      Discharge instructions      Comments:   Take insulin as directed. Suggest establishing with a primary care physician.   Activity as tolerated - No restrictions        Medication List  As of 02/26/2012  1:09 PM   STOP taking these medications         citalopram 20 MG tablet         TAKE these medications         benztropine 1 MG tablet   Commonly known as: COGENTIN   Take 1 tablet (1 mg total) by mouth 2 (two) times daily. To prevent EPS symptoms.      clonazePAM 1 MG tablet   Commonly known as: KLONOPIN   Take 1 tablet (1 mg total) by mouth 3 (three) times daily at 8am, 2pm and bedtime. For anxiety.      doxycycline 100 MG capsule   Commonly known as: VIBRAMYCIN   Take 2 capsules (200 mg total) by mouth every 12 (twelve) hours. For L Thumb infection.  Follow up with Dr. Stefano Gaul MD for this.      fluPHENAZine 5 MG tablet   Commonly known as: PROLIXIN   Take 1 tablet (5 mg total) by mouth 2 (two) times daily in the am and at bedtime.. For psychosis/voices.      ibuprofen 800 MG tablet   Commonly known as: ADVIL,MOTRIN   Take 1 tablet (800 mg total) by mouth 3 (three) times daily as needed for pain (for L thumb pain. ).      insulin aspart 100 UNIT/ML injection   Commonly known as: novoLOG   Inject 5 units subcutaneous, three times daily with meals. Use this dose only when you eat. This  is a short-acting insulin for meal coverage, for diabetes.      insulin glargine 100 UNIT/ML injection   Commonly known as: LANTUS   Inject 30 Units into the skin at bedtime. Long acting insulin for diabetes control      QUEtiapine 200 MG tablet   Commonly known as: SEROQUEL   Take 1 tablet (200 mg total) by mouth at bedtime. Helps with psychosis/voices, and sleep.      sertraline 50 MG tablet   Commonly known as: ZOLOFT   Take 1 tablet (50 mg total) by mouth daily. For depression.        traZODone 100 MG tablet   Commonly known as: DESYREL   Take 2 tablets (200 mg total) by mouth at bedtime. For sleep           Follow-up Information    Follow up with [facility] Schedule an appointment as soon as possible for a visit in 1 week.   Contact information:   [phone] [address]  [address] [place], [place] [address]           The results of significant diagnostics from this hospitalization (including imaging, microbiology, ancillary and laboratory) are listed below for reference.    Significant Diagnostic Studies: Dg Chest 2 View  02/25/2012  *RADIOLOGY REPORT*  Clinical Data: Hyperglycemia.  Medical clearance.  CHEST - 2 VIEW  Comparison: 08/19/2011.  Findings: The cardiac silhouette, mediastinal and hilar contours are within normal limits and stable. The lungs are clear.  No pleural effusions.  The bony thorax is intact  IMPRESSION: Normal chest x-ray.  No change since prior study.  Original Report Authenticated By: P. Loralie Champagne, M.D.    Microbiology: Recent Results (from the past 240 hour(s))  MRSA PCR SCREENING     Status: Normal   Collection Time   02/25/12  3:47 PM      Component Value Range Status Comment   MRSA by PCR NEGATIVE  NEGATIVE  Final      Labs: Basic Metabolic Panel:  Lab 02/26/12 9562 02/25/12 1623 02/25/12 1108 02/25/12 1041 02/20/12 0630  NA 142 140 132* 129* 137  K 3.6 3.4* -- -- --  CL 108 104 95* 88* 100  CO2 29 29 -- 25 32  GLUCOSE 118* 91 >700* 871* 187*  BUN 15 12 17 16 17   CREATININE 0.46* 0.40* 0.60 0.58 0.49*  CALCIUM 8.3* 8.9 -- 9.3 9.2  MG -- -- -- -- --  PHOS -- -- -- -- --   CBC:  Lab 02/25/12 1623 02/25/12 1108 02/25/12 1041  WBC 8.1 -- 6.1  NEUTROABS -- -- 4.4  HGB 13.7 15.3 14.7  HCT 37.9* 45.0 43.4  MCV 86.7 -- 91.8  PLT 282 -- 257   CBG:  Lab 02/26/12 1154 02/26/12 0735 02/25/12 2046 02/25/12 1803 02/25/12 1700  GLUCAP 333* 125* 380* 134* 117*    Time  coordinating discharge: 35 minutes.  Signed:  Brendia Sacks, MD  [facility] 02/26/2012, 1:09 PM

## 2012-02-26 NOTE — Progress Notes (Signed)
Patient very insistent to go home, MD already told the patient that he will be discharged after Psych MD will see him. Patient was very irritable   Yelling and wanting to see the house supervisor, charge nurse made aware and called AC (B. Shearer) spoke with patient and the same time Marchelle Folks, Clinical Psych came in to talk to patient.Patient calm down for a while and started shouting and yelling again. Psych MD came to evaluate patient and cleared for discharge. Patient was discharge  ambulatory, no s/s of distress or hallucination and was calm and quiet, bus pass was given to patient.Discharge instructions and follow up appointment was done and discussed with patient.

## 2012-02-26 NOTE — Progress Notes (Signed)
Pt evaluated by psych MD.    Per psych MD, IVC not necessary.  Psych MD asking CSW to f/u with ACT Team.  Spoke with Ce-Ce at [facility] team.  Per Ce-Ce, MD will follow up with Pt in Pt's home this afternoon.  Notified MD.  Providence Crosby, LCSWA Clinical Social Work [phone]

## 2012-02-26 NOTE — Progress Notes (Signed)
Inpatient Diabetes Program Recommendations  AACE/ADA: New Consensus Statement on Inpatient Glycemic Control (2009)  Target Ranges:  Prepandial:   less than 140 mg/dL      Peak postprandial:   less than 180 mg/dL (1-2 hours)      Critically ill patients:  140 - 180 mg/dL   Inpatient Diabetes Program Recommendations Insulin - Meal Coverage: Add Novolog 4 units with meals  Note: Type 1 DM and elevated post-prandial CBGs.  Will continue to follow during this admission.  Thank you  Piedad Climes RN,BSN,CDE Inpatient Diabetes Coordinator

## 2012-02-26 NOTE — Plan of Care (Signed)
Problem: Consults Goal: Diagnosis-Diabetes Mellitus Outcome: Completed/Met Date Met:  02/26/12 Hyperglycemia

## 2012-03-05 ENCOUNTER — Emergency Department (HOSPITAL_COMMUNITY)
Admission: EM | Admit: 2012-03-05 | Discharge: 2012-03-05 | Disposition: A | Discharge status: Home / Self Care | Payer: Medicare Other | Attending: Emergency Medicine | Admitting: Emergency Medicine

## 2012-03-05 ENCOUNTER — Encounter (HOSPITAL_COMMUNITY): Payer: Self-pay | Admitting: Emergency Medicine

## 2012-03-05 DIAGNOSIS — F203 Undifferentiated schizophrenia: Secondary | ICD-10-CM

## 2012-03-05 DIAGNOSIS — R443 Hallucinations, unspecified: Secondary | ICD-10-CM | POA: Insufficient documentation

## 2012-03-05 DIAGNOSIS — R739 Hyperglycemia, unspecified: Secondary | ICD-10-CM

## 2012-03-05 DIAGNOSIS — Z9114 Patient's other noncompliance with medication regimen: Secondary | ICD-10-CM

## 2012-03-05 DIAGNOSIS — Z91199 Patient's noncompliance with other medical treatment and regimen due to unspecified reason: Secondary | ICD-10-CM | POA: Insufficient documentation

## 2012-03-05 DIAGNOSIS — F411 Generalized anxiety disorder: Secondary | ICD-10-CM

## 2012-03-05 DIAGNOSIS — E1169 Type 2 diabetes mellitus with other specified complication: Secondary | ICD-10-CM | POA: Insufficient documentation

## 2012-03-05 DIAGNOSIS — Z9119 Patient's noncompliance with other medical treatment and regimen: Secondary | ICD-10-CM | POA: Insufficient documentation

## 2012-03-05 LAB — RAPID URINE DRUG SCREEN, HOSP PERFORMED
Amphetamines: NOT DETECTED
Barbiturates: NOT DETECTED
Benzodiazepines: POSITIVE — AB
Tetrahydrocannabinol: POSITIVE — AB

## 2012-03-05 LAB — GLUCOSE, CAPILLARY: Glucose-Capillary: 196 mg/dL — ABNORMAL HIGH (ref 70–99)

## 2012-03-05 LAB — COMPREHENSIVE METABOLIC PANEL
ALT: 20 U/L (ref 0–53)
AST: 11 U/L (ref 0–37)
Albumin: 4.4 g/dL (ref 3.5–5.2)
Alkaline Phosphatase: 81 U/L (ref 39–117)
GFR calc Af Amer: >90 mL/min (ref >90)
Glucose, Bld: 535 mg/dL — ABNORMAL HIGH (ref 70–99)
Potassium: 4.4 mEq/L (ref 3.5–5.1)
Sodium: 135 mEq/L (ref 135–145)
Total Protein: 6.8 g/dL (ref 6.0–8.3)

## 2012-03-05 LAB — CBC
Hemoglobin: 15.3 g/dL (ref 13.0–17.0)
MCHC: 35.7 g/dL (ref 30.0–36.0)
RBC: 4.89 MIL/uL (ref 4.22–5.81)

## 2012-03-05 MED ORDER — SERTRALINE HCL 50 MG PO TABS
50.0000 mg | ORAL_TABLET | Freq: Every day | ORAL | Status: DC
  Administered 2012-03-05: 50 mg via ORAL
  Filled 2012-03-05: qty 1

## 2012-03-05 MED ORDER — SODIUM CHLORIDE 0.9 % IV BOLUS (SEPSIS)
1000.0000 mL | Freq: Once | INTRAVENOUS | Status: AC
  Administered 2012-03-05: 1000 mL via INTRAVENOUS

## 2012-03-05 MED ORDER — FLUPHENAZINE HCL 5 MG PO TABS
5.0000 mg | ORAL_TABLET | ORAL | Status: DC
  Filled 2012-03-05 (×4): qty 1

## 2012-03-05 MED ORDER — ZOLPIDEM TARTRATE 5 MG PO TABS
5.0000 mg | ORAL_TABLET | Freq: Every evening | ORAL | Status: DC | PRN
  Administered 2012-03-05: 5 mg via ORAL
  Filled 2012-03-05: qty 1

## 2012-03-05 MED ORDER — INSULIN REGULAR HUMAN 100 UNIT/ML IJ SOLN
10.0000 [IU] | Freq: Once | INTRAMUSCULAR | Status: AC
  Administered 2012-03-05: 10 [IU] via SUBCUTANEOUS

## 2012-03-05 MED ORDER — INSULIN ASPART 100 UNIT/ML ~~LOC~~ SOLN
5.0000 [IU] | Freq: Three times a day (TID) | SUBCUTANEOUS | Status: DC
  Filled 2012-03-05: qty 1

## 2012-03-05 MED ORDER — CLONAZEPAM 1 MG PO TABS
1.0000 mg | ORAL_TABLET | ORAL | Status: DC
  Administered 2012-03-05 (×2): 1 mg via ORAL
  Filled 2012-03-05: qty 1
  Filled 2012-03-05: qty 2

## 2012-03-05 MED ORDER — INSULIN ASPART 100 UNIT/ML ~~LOC~~ SOLN: 6.0000 [IU] | SUBCUTANEOUS | Status: DC

## 2012-03-05 MED ORDER — QUETIAPINE FUMARATE 100 MG PO TABS
200.0000 mg | ORAL_TABLET | Freq: Every day | ORAL | Status: DC
  Administered 2012-03-05 (×2): 200 mg via ORAL
  Filled 2012-03-05 (×2): qty 2

## 2012-03-05 MED ORDER — ONDANSETRON HCL 4 MG PO TABS: 4.0000 mg | ORAL_TABLET | Freq: Three times a day (TID) | ORAL | Status: DC | PRN

## 2012-03-05 MED ORDER — BENZTROPINE MESYLATE 1 MG PO TABS
1.0000 mg | ORAL_TABLET | Freq: Two times a day (BID) | ORAL | Status: DC
  Administered 2012-03-05 (×2): 1 mg via ORAL
  Filled 2012-03-05 (×2): qty 1

## 2012-03-05 MED ORDER — ACETAMINOPHEN 325 MG PO TABS: 650.0000 mg | ORAL_TABLET | ORAL | Status: DC | PRN

## 2012-03-05 MED ORDER — IBUPROFEN 600 MG PO TABS
600.0000 mg | ORAL_TABLET | Freq: Three times a day (TID) | ORAL | Status: DC | PRN
  Administered 2012-03-05: 600 mg via ORAL
  Filled 2012-03-05: qty 3

## 2012-03-05 MED ORDER — INSULIN GLARGINE 100 UNIT/ML ~~LOC~~ SOLN
30.0000 [IU] | Freq: Every day | SUBCUTANEOUS | Status: DC
  Administered 2012-03-05: 30 [IU] via SUBCUTANEOUS
  Filled 2012-03-05: qty 1

## 2012-03-05 NOTE — ED Notes (Signed)
CBG 196 

## 2012-03-05 NOTE — ED Notes (Signed)
ZOX:[id number]<AV> Expected date:03/05/12<BR> Expected time: 4:08 PM<BR> Means of arrival:Other<BR> Comments:<BR> Hold for [employer]
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
## 2012-03-05 NOTE — ED Notes (Signed)
Continues to ask for food. Blood sugar over 500. Pt is verbally abusive to staff and states RN is incompetent. Explained need for IV fluids and IV insulin and that pt can not have cookies at this time. Pt continues to ask for cookies and food. conitinues to threaten to leave AMA

## 2012-03-05 NOTE — ED Provider Notes (Addendum)
History     CSN: [id number]  Arrival date & time 03/05/12  1425   First MD Initiated Contact with Patient 03/05/12 1504      Chief Complaint  Patient presents with  . Medical Clearance    The history is provided by the patient.   the patient reports a history of schizoaffective disorder and medication noncompliance secondary to the inability to afford his Seroquel.  His been off for cerebral for approximately one week.  She reports auditory hallucinations and reports the scare him.  They don't necessarily tell him to kill himself or other people however he does not feel as though being restarted on his medications and discharged home as appropriate care.  He denies homicidal and suicidal thoughts at this time.  He reports otherwise compliance with his additional medications.  He denies chest pain or abdominal pain.  His had no nausea vomiting or diarrhea.  His had no fever chills.  Nothing worsens the symptoms.  Nothing improves his symptoms.  Symptoms are constant the  Past Medical History  Diagnosis Date  . Diabetes mellitus   . Depression   . Schizo affective schizophrenia   . Polysubstance abuse     Past Surgical History  Procedure Date  . I&d extremity 12/19/2011    Procedure: IRRIGATION AND DEBRIDEMENT EXTREMITY;  Surgeon: Sharma Covert;  Location: [facility] OR;  Service: Orthopedics;  Laterality: Left;    No family history on file.  History  Substance Use Topics  . Smoking status: Not on file  . Smokeless tobacco: Not on file  . Alcohol Use:       Review of Systems  All other systems reviewed and are negative.    Allergies  Percocet; Sulfa antibiotics; and Sulfa antibiotics  Home Medications   Current Outpatient Rx  Name Route Sig Dispense Refill  . BENZTROPINE MESYLATE 1 MG PO TABS Oral Take 1 tablet (1 mg total) by mouth 2 (two) times daily. To prevent EPS symptoms. 60 tablet 0  . CLONAZEPAM 1 MG PO TABS Oral Take 1 tablet (1 mg total) by mouth 3 (three) times  daily at 8am, 2pm and bedtime. For anxiety. 90 tablet 0  . DOXYCYCLINE HYCLATE 50 MG PO CAPS Oral Take 100 mg by mouth 2 (two) times daily. For 7 days.    [name] FLUPHENAZINE HCL 5 MG PO TABS Oral Take 5 mg by mouth 2 (two) times daily in the am and at bedtime.. For psychosis/voices.    [name] HYDROMORPHONE HCL 2 MG PO TABS Oral Take 2 mg by mouth every 4 (four) hours as needed.    . IBUPROFEN 800 MG PO TABS Oral Take 800 mg by mouth 2 (two) times daily as needed.    . INSULIN ASPART 100 UNIT/ML [place] SOLN Subcutaneous Inject 6 Units into the skin See admin instructions. Patient uses sliding scale. Inject 6 units subcutaneous, three times daily with meals. Use this dose only when you eat. This is a short-acting insulin for meal coverage, for diabetes.    . INSULIN GLARGINE 100 UNIT/ML [place] SOLN Subcutaneous Inject 30 Units into the skin at bedtime. Long acting insulin for diabetes control 10 mL 0  . QUETIAPINE FUMARATE 200 MG PO TABS Oral Take 200 mg by mouth at bedtime. Helps with psychosis/voices, and sleep. Too exspensive    . SERTRALINE HCL 50 MG PO TABS Oral Take 50 mg by mouth daily. For depression. Just started med, have not picked up yet [facility] [address]    .  TRAZODONE HCL 100 MG PO TABS Oral Take 2 tablets (200 mg total) by mouth at bedtime. For sleep 30 tablet 0    BP 105/59  Pulse 69  Temp(Src) 97.9 F (36.6 C) (Oral)  Resp 18  SpO2 100%  Physical Exam  Nursing note and vitals reviewed. Constitutional: He is oriented to person, place, and time. He appears well-developed and well-nourished.  HENT:  Head: Normocephalic and atraumatic.  Eyes: EOM are normal.  Neck: Normal range of motion.  Cardiovascular: Normal rate, regular rhythm, normal heart sounds and intact distal pulses.   Pulmonary/Chest: Effort normal and breath sounds normal. No respiratory distress.  Abdominal: Soft. He exhibits no distension. There is no tenderness.  Musculoskeletal: Normal range of motion.  Neurological: He  is alert and oriented to person, place, and time.  Skin: Skin is warm and dry.  Psychiatric: He has a normal mood and affect. Judgment normal.    ED Course  Procedures (including critical care time)   Labs Reviewed  CBC  COMPREHENSIVE METABOLIC PANEL  ETHANOL  URINE RAPID DRUG SCREEN (HOSP PERFORMED)   No results found.   No diagnosis found.    MDM  I've asked the behavioral health assessment team to evaluate the patient for possible admission.  At this point we'll restart the patients medications and he may be stabilized in the ER once he is compliant with his medications.  The ability to obtain his medications will still need to be addressed however  5:25 PM The patient has hyperglycemia.  He has no evidence of diabetic ketoacidosis.  His anion gap.  He's been hydrated in the ER and given IV insulin.  Will readdress his blood sugar in a short time  6:32 PM Glucose inproved. Will ask telepsych to evaluate the patient for medication recommendations  11:28 PM The pt was evaluated by the psychiatrist, Dr Debbora Presto, MD who determined that the pt was safe for discharge home. The patient has medicaid and therefore should be able to get his seroquel without difficulty. The pt is aggreable to discharge home  Lyanne Co, MD 03/05/12 1534  Lyanne Co, MD 03/05/12 1832  Lyanne Co, MD 03/05/12 2329

## 2012-03-05 NOTE — ED Notes (Signed)
Pt has completed his tele-psych which recommends discharge. EDP notified and is in agreement with disposition. RN made aware. CSW met with pt and provided referrals for community mental health services such as [facility] and mobile crisis services. Pt agreed to follow up with his ACTT team. No further needs identified at this time.

## 2012-03-05 NOTE — ED Notes (Signed)
Pt states that he is having hallucinations from being off seraqual and that he wants to have treatment in a facility to treat this. Denies any si or hi. Alert x4, was sent over from [facility].

## 2012-03-06 NOTE — ED Notes (Signed)
Discharge instructions reviewed with pt, who verbalizes understanding. Departs unit in safe and stable condition.

## 2012-03-08 ENCOUNTER — Encounter (HOSPITAL_COMMUNITY): Payer: Self-pay | Admitting: Emergency Medicine

## 2012-03-08 ENCOUNTER — Emergency Department (HOSPITAL_COMMUNITY)
Admission: EM | Admit: 2012-03-08 | Discharge: 2012-03-08 | Discharge status: Against Medical Advice | Payer: Medicare Other | Attending: Emergency Medicine | Admitting: Emergency Medicine

## 2012-03-08 DIAGNOSIS — M545 Low back pain, unspecified: Secondary | ICD-10-CM

## 2012-03-08 DIAGNOSIS — R739 Hyperglycemia, unspecified: Secondary | ICD-10-CM

## 2012-03-08 DIAGNOSIS — Z794 Long term (current) use of insulin: Secondary | ICD-10-CM | POA: Insufficient documentation

## 2012-03-08 DIAGNOSIS — Z9119 Patient's noncompliance with other medical treatment and regimen: Secondary | ICD-10-CM

## 2012-03-08 DIAGNOSIS — F3289 Other specified depressive episodes: Secondary | ICD-10-CM | POA: Insufficient documentation

## 2012-03-08 DIAGNOSIS — F329 Major depressive disorder, single episode, unspecified: Secondary | ICD-10-CM | POA: Insufficient documentation

## 2012-03-08 DIAGNOSIS — E119 Type 2 diabetes mellitus without complications: Secondary | ICD-10-CM | POA: Insufficient documentation

## 2012-03-08 DIAGNOSIS — F17200 Nicotine dependence, unspecified, uncomplicated: Secondary | ICD-10-CM | POA: Insufficient documentation

## 2012-03-08 DIAGNOSIS — Z882 Allergy status to sulfonamides status: Secondary | ICD-10-CM | POA: Insufficient documentation

## 2012-03-08 DIAGNOSIS — F259 Schizoaffective disorder, unspecified: Secondary | ICD-10-CM | POA: Insufficient documentation

## 2012-03-08 DIAGNOSIS — Z91199 Patient's noncompliance with other medical treatment and regimen due to unspecified reason: Secondary | ICD-10-CM

## 2012-03-08 MED ORDER — HYDROCODONE-ACETAMINOPHEN 5-500 MG PO TABS: 1.0000 | ORAL_TABLET | Freq: Three times a day (TID) | ORAL | Status: AC | PRN

## 2012-03-08 NOTE — ED Notes (Signed)
States woke up one day ago with lower back not radiating continued today 8/10 sharp.  Denies any trauma.

## 2012-03-08 NOTE — ED Notes (Signed)
Pt aware of risk of leaving with high blood sugar. ama form signed. No complaints. Ambulatory at discharge.

## 2012-03-08 NOTE — Discharge Instructions (Signed)
Back Exercises Back exercises help treat and prevent back injuries. The goal of back exercises is to increase the strength of your abdominal and back muscles and the flexibility of your back. These exercises should be started when you no longer have back pain. Back exercises include:  Pelvic Tilt. Lie on your back with your knees bent. Tilt your pelvis until the lower part of your back is against the floor. Hold this position 5 to 10 sec and repeat 5 to 10 times.   Knee to Chest. Pull first 1 knee up against your chest and hold for 20 to 30 seconds, repeat this with the other knee, and then both knees. This may be done with the other leg straight or bent, whichever feels better.   Sit-Ups or Curl-Ups. Bend your knees 90 degrees. Start with tilting your pelvis, and do a partial, slow sit-up, lifting your trunk only 30 to 45 degrees off the floor. Take at least 2 to 3 seconds for each sit-up. Do not do sit-ups with your knees out straight. If partial sit-ups are difficult, simply do the above but with only tightening your abdominal muscles and holding it as directed.   Hip-Lift. Lie on your back with your knees flexed 90 degrees. Push down with your feet and shoulders as you raise your hips a couple inches off the floor; hold for 10 seconds, repeat 5 to 10 times.   Back arches. Lie on your stomach, propping yourself up on bent elbows. Slowly press on your hands, causing an arch in your low back. Repeat 3 to 5 times. Any initial stiffness and discomfort should lessen with repetition over time.   Shoulder-Lifts. Lie face down with arms beside your body. Keep hips and torso pressed to floor as you slowly lift your head and shoulders off the floor.  Do not overdo your exercises, especially in the beginning. Exercises may cause you some mild back discomfort which lasts for a few minutes; however, if the pain is more severe, or lasts for more than 15 minutes, do not continue exercises until you see your  caregiver. Improvement with exercise therapy for back problems is slow.  See your caregivers for assistance with developing a proper back exercise program. Document Released: 01/11/2005 Document Revised: 11/23/2011 Document Reviewed: 12/04/2005 [facility] Patient Information 2012 [place].Back Pain, Adult Low back pain is very common. About 1 in 5 people have back pain.The cause of low back pain is rarely dangerous. The pain often gets better over time.About half of people with a sudden onset of back pain feel better in just 2 weeks. About 8 in 10 people feel better by 6 weeks.  CAUSES Some common causes of back pain include:  Strain of the muscles or ligaments supporting the spine.   Wear and tear (degeneration) of the spinal discs.   Arthritis.   Direct injury to the back.  DIAGNOSIS Most of the time, the direct cause of low back pain is not known.However, back pain can be treated effectively even when the exact cause of the pain is unknown.Answering your caregiver's questions about your overall health and symptoms is one of the most accurate ways to make sure the cause of your pain is not dangerous. If your caregiver needs more information, he or she may order lab work or imaging tests (X-rays or MRIs).However, even if imaging tests show changes in your back, this usually does not require surgery. HOME CARE INSTRUCTIONS For many people, back pain returns.Since low back pain is rarely  dangerous, it is often a condition that people can learn to [facility] their own.   Remain active. It is stressful on the back to sit or stand in one place. Do not sit, drive, or stand in one place for more than 30 minutes at a time. Take short walks on level surfaces as soon as pain allows.Try to increase the length of time you walk each day.   Do not stay in bed.Resting more than 1 or 2 days can delay your recovery.   Do not avoid exercise or work.Your body is made to move.It is not dangerous  to be active, even though your back may hurt.Your back will likely heal faster if you return to being active before your pain is gone.   Pay attention to your body when you bend and lift. Many people have less discomfortwhen lifting if they bend their knees, keep the load close to their bodies,and avoid twisting. Often, the most comfortable positions are those that put less stress on your recovering back.   Find a comfortable position to sleep. Use a firm mattress and lie on your side with your knees slightly bent. If you lie on your back, put a pillow under your knees.   Only take over-the-counter or prescription medicines as directed by your caregiver. Over-the-counter medicines to reduce pain and inflammation are often the most helpful.Your caregiver may prescribe muscle relaxant drugs.These medicines help dull your pain so you can more quickly return to your normal activities and healthy exercise.   Put ice on the injured area.   Put ice in a plastic bag.   Place a towel between your skin and the bag.   Leave the ice on for 15 to 20 minutes, 3 to 4 times a day for the first 2 to 3 days. After that, ice and heat may be alternated to reduce pain and spasms.   Ask your caregiver about trying back exercises and gentle massage. This may be of some benefit.   Avoid feeling anxious or stressed.Stress increases muscle tension and can worsen back pain.It is important to recognize when you are anxious or stressed and learn ways to manage it.Exercise is a great option.  SEEK MEDICAL CARE IF:  You have pain that is not relieved with rest or medicine.   You have pain that does not improve in 1 week.   You have new symptoms.   You are generally not feeling well.  SEEK IMMEDIATE MEDICAL CARE IF:   You have pain that radiates from your back into your legs.   You develop new bowel or bladder control problems.   You have unusual weakness or numbness in your arms or legs.   You develop  nausea or vomiting.   You develop abdominal pain.   You feel faint.  Document Released: 12/04/2005 Document Revised: 11/23/2011 Document Reviewed: 04/24/2011 [facility] Patient Information 2012 [place], [place].   Acknowledgement of Risk of Discharge  Against Medical Advice   And Release of Liability    Because I am choosing to leave the hospital in spite of these risks, I release the hospital, its employees and officers, and my attending physician from all liability for any adverse results caused by my leaving the hospital prematurely.   ________________________________      _____________________________________ Patient's Signature  Date and Time   ________________________________      _____________________________________ Witness' Signature                                        Relationship to Patient    ________________________________      _____________________________________ Witness' Signature                                        Relationship to Patient   [If patient refuses to sign, write "Patient refused to sign" on the patient's signature line and have witnesses to the refusal sign as witnesses.]      Hyperglycemia Hyperglycemia occurs when the glucose (sugar) in your blood is too high. Hyperglycemia can happen for many reasons, but it most often happens to people who do not know they have diabetes or are not managing their diabetes properly.  CAUSES  Whether you have diabetes or not, there are other causes of hyperglycemia. Hyperglycemia can occur when you have diabetes, but it can also occur in other situations that you might not be as aware of, such as: Diabetes  If you have diabetes and are having problems controlling your blood glucose, hyperglycemia could occur because of some of the following reasons:   Not following your meal plan.   Not taking your diabetes medications or not taking it properly.   Exercising  less or doing less activity than you normally do.   Being sick.  Pre-diabetes  This cannot be ignored. Before people develop Type 2 diabetes, they almost always have "pre-diabetes." This is when your blood glucose levels are higher than normal, but not yet high enough to be diagnosed as diabetes. Research has shown that some long-term damage to the body, especially the heart and circulatory system, may already be occurring during pre-diabetes. If you take action to manage your blood glucose when you have pre-diabetes, you may delay or prevent Type 2 diabetes from developing.  Stress  If you have diabetes, you may be "diet" controlled or on oral medications or insulin to control your diabetes. However, you may find that your blood glucose is higher than usual in the hospital whether you have diabetes or not. This is often referred to as "stress hyperglycemia." Stress can elevate your blood glucose. This happens because of hormones put out by the body during times of stress. If stress has been the cause of your high blood glucose, it can be followed regularly by your caregiver. That way he/she can make sure your hyperglycemia does not continue to get worse or progress to diabetes.  Steroids  Steroids are medications that act on the infection fighting system (immune system) to block inflammation or infection. One side effect can be a rise in blood glucose. Most people can produce enough extra insulin to allow for this rise, but for those who cannot, steroids make blood glucose levels go even higher. It is not unusual for steroid treatments to "uncover" diabetes that is developing. It is not always possible to determine if the hyperglycemia will go away after the steroids are stopped. A special blood test called an A1c is sometimes done to determine if your blood glucose was elevated before the steroids were started.  SYMPTOMS  Thirsty.   Frequent urination.   Dry mouth.   Blurred vision.  Tired  or fatigue.   Weakness.   Sleepy.   Tingling in feet or leg.  DIAGNOSIS  Diagnosis is made by monitoring blood glucose in one or all of the following ways:  A1c test. This is a chemical found in your blood.   Fingerstick blood glucose monitoring.   Laboratory results.  TREATMENT  First, knowing the cause of the hyperglycemia is important before the hyperglycemia can be treated. Treatment may include, but is not be limited to:  Education.   Change or adjustment in medications.   Change or adjustment in meal plan.   Treatment for an illness, infection, etc.   More frequent blood glucose monitoring.   Change in exercise plan.   Decreasing or stopping steroids.   Lifestyle changes.  HOME CARE INSTRUCTIONS   Test your blood glucose as directed.   Exercise regularly. Your caregiver will give you instructions about exercise. Pre-diabetes or diabetes which comes on with stress is helped by exercising.   Eat wholesome, balanced meals. Eat often and at regular, fixed times. Your caregiver or nutritionist will give you a meal plan to guide your sugar intake.   Being at an ideal weight is important. If needed, losing as little as 10 to 15 pounds may help improve blood glucose levels.  SEEK MEDICAL CARE IF:   You have questions about medicine, activity, or diet.   You continue to have symptoms (problems such as increased thirst, urination, or weight gain).  SEEK IMMEDIATE MEDICAL CARE IF:   You are vomiting or have diarrhea.   Your breath smells fruity.   You are breathing faster or slower.   You are very sleepy or incoherent.   You have numbness, tingling, or pain in your feet or hands.   You have chest pain.   Your symptoms get worse even though you have been following your caregiver's orders.   If you have any other questions or concerns.  Document Released: 05/30/2001 Document Revised: 11/23/2011 Document Reviewed: 07/26/2009 [facility] Patient Information 2012  [place], [place].   Back Pain, Adult Low back pain is very common. About 1 in 5 people have back pain.The cause of low back pain is rarely dangerous. The pain often gets better over time.About half of people with a sudden onset of back pain feel better in just 2 weeks. About 8 in 10 people feel better by 6 weeks.  CAUSES Some common causes of back pain include:  Strain of the muscles or ligaments supporting the spine.   Wear and tear (degeneration) of the spinal discs.   Arthritis.   Direct injury to the back.  DIAGNOSIS Most of the time, the direct cause of low back pain is not known.However, back pain can be treated effectively even when the exact cause of the pain is unknown.Answering your caregiver's questions about your overall health and symptoms is one of the most accurate ways to make sure the cause of your pain is not dangerous. If your caregiver needs more information, he or she may order lab work or imaging tests (X-rays or MRIs).However, even if imaging tests show changes in your back, this usually does not require surgery. HOME CARE INSTRUCTIONS For many people, back pain returns.Since low back pain is rarely dangerous, it is often a condition that people can learn to [facility] their own.   Remain active. It is stressful on the back to sit or stand in one place. Do not sit, drive, or stand in one place for more than 30 minutes at  a time. Take short walks on level surfaces as soon as pain allows.Try to increase the length of time you walk each day.   Do not stay in bed.Resting more than 1 or 2 days can delay your recovery.   Do not avoid exercise or work.Your body is made to move.It is not dangerous to be active, even though your back may hurt.Your back will likely heal faster if you return to being active before your pain is gone.   Pay attention to your body when you bend and lift. Many people have less discomfortwhen lifting if they bend their knees, keep the load  close to their bodies,and avoid twisting. Often, the most comfortable positions are those that put less stress on your recovering back.   Find a comfortable position to sleep. Use a firm mattress and lie on your side with your knees slightly bent. If you lie on your back, put a pillow under your knees.   Only take over-the-counter or prescription medicines as directed by your caregiver. Over-the-counter medicines to reduce pain and inflammation are often the most helpful.Your caregiver may prescribe muscle relaxant drugs.These medicines help dull your pain so you can more quickly return to your normal activities and healthy exercise.   Put ice on the injured area.   Put ice in a plastic bag.   Place a towel between your skin and the bag.   Leave the ice on for 15 to 20 minutes, 3 to 4 times a day for the first 2 to 3 days. After that, ice and heat may be alternated to reduce pain and spasms.   Ask your caregiver about trying back exercises and gentle massage. This may be of some benefit.   Avoid feeling anxious or stressed.Stress increases muscle tension and can worsen back pain.It is important to recognize when you are anxious or stressed and learn ways to manage it.Exercise is a great option.  SEEK MEDICAL CARE IF:  You have pain that is not relieved with rest or medicine.   You have pain that does not improve in 1 week.   You have new symptoms.   You are generally not feeling well.  SEEK IMMEDIATE MEDICAL CARE IF:   You have pain that radiates from your back into your legs.   You develop new bowel or bladder control problems.   You have unusual weakness or numbness in your arms or legs.   You develop nausea or vomiting.   You develop abdominal pain.   You feel faint.  Document Released: 12/04/2005 Document Revised: 11/23/2011 Document Reviewed: 04/24/2011 [facility] Patient Information 2012 [place], [place].

## 2012-03-08 NOTE — ED Provider Notes (Signed)
History     CSN: [id number]  Arrival date & time 03/08/12  1131   First MD Initiated Contact with Patient 03/08/12 1212      Chief Complaint  Patient presents with  . Back Pain   patient does have a known history of diabetes, depression, and schizoaffective disorder. He currently denies any psychosocial symptoms. He is here today for back pain. He states he awoke with spasms and low back pain. He states he is currently not on any pain medications. The chart shows he had been on such medications as Dilaudid and ibuprofen. He denies any direct trauma. He denies any associated systemic symptoms. He's had no fever. No weakness, no numbness or tingling. No nausea, vomiting. No abdominal or genital pain. No focal deficits. Patient was taking tramadol, but states it was not helping, so he stopped taking it.  (Consider location/radiation/quality/duration/timing/severity/associated sxs/prior treatment) HPI  Past Medical History  Diagnosis Date  . Diabetes mellitus   . Depression   . Schizo affective schizophrenia   . Polysubstance abuse     Past Surgical History  Procedure Date  . I&d extremity 12/19/2011    Procedure: IRRIGATION AND DEBRIDEMENT EXTREMITY;  Surgeon: Sharma Covert;  Location: [facility] OR;  Service: Orthopedics;  Laterality: Left;    No family history on file.  History  Substance Use Topics  . Smoking status: Current Some Day Smoker -- 0.5 packs/day  . Smokeless tobacco: Not on file  . Alcohol Use: No      Review of Systems  All other systems reviewed and are negative.    Allergies  Percocet; Sulfa antibiotics; and Sulfa antibiotics  Home Medications   Current Outpatient Rx  Name Route Sig Dispense Refill  . BENZTROPINE MESYLATE 1 MG PO TABS Oral Take 1 tablet (1 mg total) by mouth 2 (two) times daily. To prevent EPS symptoms. 60 tablet 0  . CLONAZEPAM 1 MG PO TABS Oral Take 1 tablet (1 mg total) by mouth 3 (three) times daily at 8am, 2pm and bedtime. For  anxiety. 90 tablet 0  . FLUPHENAZINE HCL 5 MG PO TABS Oral Take 5 mg by mouth 2 (two) times daily in the am and at bedtime.. For psychosis/voices.    [name] HYDROMORPHONE HCL 2 MG PO TABS Oral Take 2 mg by mouth every 4 (four) hours as needed. For pain    . IBUPROFEN 800 MG PO TABS Oral Take 800 mg by mouth 2 (two) times daily as needed. For pain    . INSULIN ASPART 100 UNIT/ML [place] SOLN Subcutaneous Inject 6 Units into the skin See admin instructions. Patient uses sliding scale. Inject 6 units subcutaneous, three times daily with meals. Use this dose only when you eat. This is a short-acting insulin for meal coverage, for diabetes.    . INSULIN GLARGINE 100 UNIT/ML [place] SOLN Subcutaneous Inject 30 Units into the skin at bedtime.    [name] QUETIAPINE FUMARATE 200 MG PO TABS Oral Take 200 mg by mouth at bedtime.     . TRAZODONE HCL 100 MG PO TABS Oral Take 2 tablets (200 mg total) by mouth at bedtime. For sleep 30 tablet 0    BP 125/76  Pulse 100  Temp(Src) 97.6 F (36.4 C) (Oral)  Resp 22  SpO2 100%  Physical Exam  Nursing note and vitals reviewed. Constitutional: He is oriented to person, place, and time. He appears well-developed and well-nourished. No distress.  HENT:  Head: Normocephalic and atraumatic.  Eyes: Conjunctivae and EOM are  normal. Pupils are equal, round, and reactive to light.  Neck: Neck supple.  Cardiovascular: Normal rate and regular rhythm.  Exam reveals no gallop and no friction rub.   No murmur heard. Pulmonary/Chest: Breath sounds normal. He has no wheezes. He has no rales. He exhibits no tenderness.  Abdominal: Soft. Bowel sounds are normal. He exhibits no distension. There is no tenderness. There is no rebound and no guarding.  Musculoskeletal: Normal range of motion. He exhibits no edema.       Diffuse tenderness to palpation of the lower back and paralumbar musculature. There are a couple of abrasions, but no ecchymoses no spinal tenderness per se, no swelling no deformity    Neurological: He is alert and oriented to person, place, and time. He has normal reflexes. He displays normal reflexes. No cranial nerve deficit. He exhibits normal muscle tone. Coordination normal.  Skin: Skin is warm and dry. No rash noted. No erythema.  Psychiatric: He has a normal mood and affect.    ED Course  Procedures (including critical care time)  Labs Reviewed - No data to display No results found.   No diagnosis found.    MDM  Patient is seen and examined, initial history and physical is completed. Evaluation initiated   We'll treat for probable musculoskeletal back pain. Patient is given a very short course of pain medications and will be referred to the orthopedic specialist for followup  Also, we'll be checking blood sugar.  In regard to recent evaluation, patient has no current suicidal or homicidal thoughts. His mood and judgment appear to be normal. At this time.    12:51 PM  Blood sugar was elevated at 426. I feel this is likely chronic and not indicative of DKA. However, and nonetheless, patient refused IV, refused fluid hydration refused lab studies. He is awake, alert, oriented, and expresses adequate, mental and medical decision-making capacity. Patient was encouraged to continue taking his diabetes medications and to see his primary doctor today or tomorrow. He is encouraged to return to the ER at any time for any concerns.   Patient was encouraged to stable, will be signing out AGAINST MEDICAL ADVICE      Theron Arista A. Patrica Duel, MD 03/08/12 1252

## 2012-03-11 ENCOUNTER — Emergency Department (HOSPITAL_COMMUNITY)
Admission: EM | Admit: 2012-03-11 | Discharge: 2012-03-11 | Disposition: A | Discharge status: Home / Self Care | Payer: Medicare Other | Attending: Emergency Medicine | Admitting: Emergency Medicine

## 2012-03-11 ENCOUNTER — Encounter (HOSPITAL_COMMUNITY): Payer: Self-pay | Admitting: Emergency Medicine

## 2012-03-11 DIAGNOSIS — F259 Schizoaffective disorder, unspecified: Secondary | ICD-10-CM | POA: Insufficient documentation

## 2012-03-11 DIAGNOSIS — M25561 Pain in right knee: Secondary | ICD-10-CM

## 2012-03-11 DIAGNOSIS — F17200 Nicotine dependence, unspecified, uncomplicated: Secondary | ICD-10-CM | POA: Insufficient documentation

## 2012-03-11 DIAGNOSIS — E119 Type 2 diabetes mellitus without complications: Secondary | ICD-10-CM | POA: Insufficient documentation

## 2012-03-11 DIAGNOSIS — M25569 Pain in unspecified knee: Secondary | ICD-10-CM | POA: Insufficient documentation

## 2012-03-11 DIAGNOSIS — Z79899 Other long term (current) drug therapy: Secondary | ICD-10-CM | POA: Insufficient documentation

## 2012-03-11 DIAGNOSIS — Z794 Long term (current) use of insulin: Secondary | ICD-10-CM | POA: Insufficient documentation

## 2012-03-11 MED ORDER — IBUPROFEN 200 MG PO TABS
400.0000 mg | ORAL_TABLET | Freq: Once | ORAL | Status: AC
  Administered 2012-03-11: 400 mg via ORAL
  Filled 2012-03-11: qty 2

## 2012-03-11 NOTE — ED Notes (Signed)
Pt states that his knee began hurting yesterday not associated with trauma/injury to the knee.  States he can barely walk.

## 2012-03-11 NOTE — Discharge Instructions (Signed)
Arthralgia Your caregiver has diagnosed you as suffering from an arthralgia. Arthralgia means there is pain in a joint. This can come from many reasons including:  Bruising the joint which causes soreness (inflammation) in the joint.   Wear and tear on the joints which occur as we grow older (osteoarthritis).   Overusing the joint.   Various forms of arthritis.   Infections of the joint.  Regardless of the cause of pain in your joint, most of these different pains respond to anti-inflammatory drugs and rest. The exception to this is when a joint is infected, and these cases are treated with antibiotics, if it is a bacterial infection. HOME CARE INSTRUCTIONS   Rest the injured area for as long as directed by your caregiver. Then slowly start using the joint as directed by your caregiver and as the pain allows. Crutches as directed may be useful if the ankles, knees or hips are involved. If the knee was splinted or casted, continue use and care as directed. If an stretchy or elastic wrapping bandage has been applied today, it should be removed and re-applied every 3 to 4 hours. It should not be applied tightly, but firmly enough to keep swelling down. Watch toes and feet for swelling, bluish discoloration, coldness, numbness or excessive pain. If any of these problems (symptoms) occur, remove the ace bandage and re-apply more loosely. If these symptoms persist, contact your caregiver or return to this location.   For the first 24 hours, keep the injured extremity elevated on pillows while lying down.   Apply ice for 15 to 20 minutes to the sore joint every couple hours while awake for the first half day. Then 3 to 4 times per day for the first 48 hours. Put the ice in a plastic bag and place a towel between the bag of ice and your skin.   Wear any splinting, casting, elastic bandage applications, or slings as instructed.   Only take over-the-counter or prescription medicines for pain,  discomfort, or fever as directed by your caregiver. Do not use aspirin immediately after the injury unless instructed by your physician. Aspirin can cause increased bleeding and bruising of the tissues.   If you were given crutches, continue to use them as instructed and do not resume weight bearing on the sore joint until instructed.  Persistent pain and inability to use the sore joint as directed for more than 2 to 3 days are warning signs indicating that you should see a caregiver for a follow-up visit as soon as possible. Initially, a hairline fracture (break in bone) may not be evident on X-rays. Persistent pain and swelling indicate that further evaluation, non-weight bearing or use of the joint (use of crutches or slings as instructed), or further X-rays are indicated. X-rays may sometimes not show a small fracture until a week or 10 days later. Make a follow-up appointment with your own caregiver or one to whom we have referred you. A radiologist (specialist in reading X-rays) may read your X-rays. Make sure you know how you are to obtain your X-ray results. Do not assume everything is normal if you do not hear from us. SEEK MEDICAL CARE IF: Bruising, swelling, or pain increases. SEEK IMMEDIATE MEDICAL CARE IF:   Your fingers or toes are numb or blue.   The pain is not responding to medications and continues to stay the same or get worse.   The pain in your joint becomes severe.   You develop a fever over   102 F (38.9 C).   It becomes impossible to move or use the joint.  MAKE SURE YOU:   Understand these instructions.   Will watch your condition.   Will get help right away if you are not doing well or get worse.  Document Released: 12/04/2005 Document Revised: 11/23/2011 Document Reviewed: 07/22/2008 [employer] Patient Information 2012 [employer].Knee Pain The knee is the complex joint between your thigh and your lower leg. It is made up of bones, tendons, ligaments, and  cartilage. The bones that make up the knee are:  The femur in the thigh.   The tibia and fibula in the lower leg.   The patella or kneecap riding in the groove on the lower femur.  CAUSES  Knee pain is a common complaint with many causes. A few of these causes are:  Injury, such as:   A ruptured ligament or tendon injury.   Torn cartilage.   Medical conditions, such as:   Gout   Arthritis   Infections   Overuse, over training or overdoing a physical activity.  Knee pain can be minor or severe. Knee pain can accompany debilitating injury. Minor knee problems often respond well to self-care measures or get well on their own. More serious injuries may need medical intervention or even surgery. SYMPTOMS The knee is complex. Symptoms of knee problems can vary widely. Some of the problems are:  Pain with movement and weight bearing.   Swelling and tenderness.   Buckling of the knee.   Inability to straighten or extend your knee.   Your knee locks and you cannot straighten it.   Warmth and redness with pain and fever.   Deformity or dislocation of the kneecap.  DIAGNOSIS  Determining what is wrong may be very straight forward such as when there is an injury. It can also be challenging because of the complexity of the knee. Tests to make a diagnosis may include:  Your caregiver taking a history and doing a physical exam.   Routine X-rays can be used to rule out other problems. X-rays will not reveal a cartilage tear. Some injuries of the knee can be diagnosed by:   Arthroscopy a surgical technique by which a small video camera is inserted through tiny incisions on the sides of the knee. This procedure is used to examine and repair internal knee joint problems. Tiny instruments can be used during arthroscopy to repair the torn knee cartilage (meniscus).   Arthrography is a radiology technique. A contrast liquid is directly injected into the knee joint. Internal structures of  the knee joint then become visible on X-ray film.   An MRI scan is a non x-ray radiology procedure in which magnetic fields and a computer produce two- or three-dimensional images of the inside of the knee. Cartilage tears are often visible using an MRI scanner. MRI scans have largely replaced arthrography in diagnosing cartilage tears of the knee.   Blood work.   Examination of the fluid that helps to lubricate the knee joint (synovial fluid). This is done by taking a sample out using a needle and a syringe.  TREATMENT The treatment of knee problems depends on the cause. Some of these treatments are:  Depending on the injury, proper casting, splinting, surgery or physical therapy care will be needed.   Give yourself adequate recovery time. Do not overuse your joints. If you begin to get sore during workout routines, back off. Slow down or do fewer repetitions.   For repetitive activities   such as cycling or running, maintain your strength and nutrition.   Alternate muscle groups. For example if you are a weight lifter, work the upper body on one day and the lower body the next.   Either tight or weak muscles do not give the proper support for your knee. Tight or weak muscles do not absorb the stress placed on the knee joint. Keep the muscles surrounding the knee strong.   Take care of mechanical problems.   If you have flat feet, orthotics or special shoes may help. See your caregiver if you need help.   Arch supports, sometimes with wedges on the inner or outer aspect of the heel, can help. These can shift pressure away from the side of the knee most bothered by osteoarthritis.   A brace called an "unloader" brace also may be used to help ease the pressure on the most arthritic side of the knee.   If your caregiver has prescribed crutches, braces, wraps or ice, use as directed. The acronym for this is PRICE. This means protection, rest, ice, compression and elevation.   Nonsteroidal  anti-inflammatory drugs (NSAID's), can help relieve pain. But if taken immediately after an injury, they may actually increase swelling. Take NSAID's with food in your stomach. Stop them if you develop stomach problems. Do not take these if you have a history of ulcers, stomach pain or bleeding from the bowel. Do not take without your caregiver's approval if you have problems with fluid retention, heart failure, or kidney problems.   For ongoing knee problems, physical therapy may be helpful.   Glucosamine and chondroitin are over-the-counter dietary supplements. Both may help relieve the pain of osteoarthritis in the knee. These medicines are different from the usual anti-inflammatory drugs. Glucosamine may decrease the rate of cartilage destruction.   Injections of a corticosteroid drug into your knee joint may help reduce the symptoms of an arthritis flare-up. They may provide pain relief that lasts a few months. You may have to wait a few months between injections. The injections do have a small increased risk of infection, water retention and elevated blood sugar levels.   Hyaluronic acid injected into damaged joints may ease pain and provide lubrication. These injections may work by reducing inflammation. A series of shots may give relief for as long as 6 months.   Topical painkillers. Applying certain ointments to your skin may help relieve the pain and stiffness of osteoarthritis. Ask your pharmacist for suggestions. Many over the-counter products are approved for temporary relief of arthritis pain.   In some countries, doctors often prescribe topical NSAID's for relief of chronic conditions such as arthritis and tendinitis. A review of treatment with NSAID creams found that they worked as well as oral medications but without the serious side effects.  PREVENTION  Maintain a healthy weight. Extra pounds put more strain on your joints.   Get strong, stay limber. Weak muscles are a common  cause of knee injuries. Stretching is important. Include flexibility exercises in your workouts.   Be smart about exercise. If you have osteoarthritis, chronic knee pain or recurring injuries, you may need to change the way you exercise. This does not mean you have to stop being active. If your knees ache after jogging or playing basketball, consider switching to swimming, water aerobics or other low-impact activities, at least for a few days a week. Sometimes limiting high-impact activities will provide relief.   Make sure your shoes fit well. Choose footwear that is right   for your sport.   Protect your knees. Use the proper gear for knee-sensitive activities. Use kneepads when playing volleyball or laying carpet. Buckle your seat belt every time you drive. Most shattered kneecaps occur in car accidents.   Rest when you are tired.  SEEK MEDICAL CARE IF:  You have knee pain that is continual and does not seem to be getting better.  SEEK IMMEDIATE MEDICAL CARE IF:  Your knee joint feels hot to the touch and you have a high fever. MAKE SURE YOU:   Understand these instructions.   Will watch your condition.   Will get help right away if you are not doing well or get worse.  Document Released: 10/01/2007 Document Revised: 11/23/2011 Document Reviewed: 10/01/2007 [name] Patient Information 2012 [place], [place].  RESOURCE GUIDE  Dental Problems  Patients with Medicaid: [facility] [id number]                                           [phone] [address] Phone:  [phone]                                                  Phone:  [phone]  If unable to pay or uninsured, contact:  [facility] or [facility]. to become qualified for the adult dental clinic.  Chronic Pain Problems Contact [facility]  [phone] Patients need to be referred by their primary care doctor.  Insufficient Money for  Medicine Contact [employer] Way:  call "211" or [facility] [phone].  No Primary Care Doctor Call [facility]  [phone] Other agencies that provide inexpensive medical care    [facility]  [phone]    [facility]  [phone]    [facility]  [phone]    [facility]  [phone]    [facility]  [phone]    [facility]  [phone]  Psychological Services [facility]  [phone] [facility] Services  [phone] [facility] Mental Health   [phone] (emergency services [phone])  Substance Abuse Resources [facility]  [phone] [facility] [phone] [facility] [phone] Floydene Flock [phone] Residential & Outpatient Substance Abuse Program  4400830795  Abuse/Neglect [facility] Child Abuse Hotline [phone] [facility] Child Abuse Hotline [phone])220-6598 (After Hours)  Emergency Shelter [facility] [phone]  Maternity Homes [facility] 331-564-0705 [facility] [phone]  MRSA Hotline #:   7173581575    [facility] Resources  [facility]     [employer]                          [facility] Dept. [address] [place]                       [address]      [address]  [address]  [place]ela Hua Phone:  [phone]                                   Phone:  [phone]                 Phone:  [place] Phone:  [place] [phone] (After Hours)

## 2012-03-11 NOTE — ED Notes (Signed)
Pt states that his knee began hurting yesterday not associated with injury/trauma.

## 2012-03-11 NOTE — Progress Notes (Signed)
Orthopedic Tech Progress Note Patient Details:  [name] 03-Jun-1986 [id number]  Other Ortho Devices Type of Ortho Device: Crutches Ortho Device Interventions: Adjustment   Cammer, Mickie Bail 03/11/2012, 1:37 PM

## 2012-03-11 NOTE — ED Notes (Signed)
Previous MRSA infection in January 2013 per pt.

## 2012-03-11 NOTE — ED Provider Notes (Signed)
History    26 year old male with right knee pain. Onset while walking yesterday. Patient reports he gets a "twinge" in the lateral aspect of his right knee when he steps. No pain at rest. No numbness or tingling. Denies any significant acute pain anywhere else. A history of similar symptoms. No fevers or chills. No swelling or redness. Denies history of blood clot. Patient has been taking Vicodin for the pain without relief.  CSN: [id number]  Arrival date & time 03/11/12  1209   First MD Initiated Contact with Patient 03/11/12 1304      Chief Complaint  Patient presents with  . Knee Pain    (Consider location/radiation/quality/duration/timing/severity/associated sxs/prior treatment) HPI  Past Medical History  Diagnosis Date  . Diabetes mellitus   . Depression   . Schizo affective schizophrenia   . Polysubstance abuse     Past Surgical History  Procedure Date  . I&d extremity 12/19/2011    Procedure: IRRIGATION AND DEBRIDEMENT EXTREMITY;  Surgeon: Sharma Covert;  Location: [facility] OR;  Service: Orthopedics;  Laterality: Left;    No family history on file.  History  Substance Use Topics  . Smoking status: Current Some Day Smoker -- 0.5 packs/day  . Smokeless tobacco: Not on file  . Alcohol Use: No      Review of Systems   Review of symptoms negative unless otherwise noted in HPI.   Allergies  Percocet; Sulfa antibiotics; and Sulfa antibiotics  Home Medications   Current Outpatient Rx  Name Route Sig Dispense Refill  . BENZTROPINE MESYLATE 1 MG PO TABS Oral Take 1 tablet (1 mg total) by mouth 2 (two) times daily. To prevent EPS symptoms. 60 tablet 0  . CLONAZEPAM 1 MG PO TABS Oral Take 1 tablet (1 mg total) by mouth 3 (three) times daily at 8am, 2pm and bedtime. For anxiety. 90 tablet 0  . FLUPHENAZINE HCL 5 MG PO TABS Oral Take 5 mg by mouth 2 (two) times daily in the am and at bedtime.. For psychosis/voices.    [name] HYDROCODONE-ACETAMINOPHEN 5-500 MG PO TABS Oral Take  1 tablet by mouth every 8 (eight) hours as needed for pain. 10 tablet 0  . HYDROMORPHONE HCL 2 MG PO TABS Oral Take 2 mg by mouth every 4 (four) hours as needed. For pain    . IBUPROFEN 800 MG PO TABS Oral Take 800 mg by mouth 2 (two) times daily as needed. For pain    . INSULIN ASPART 100 UNIT/ML [place] SOLN Subcutaneous Inject 6 Units into the skin See admin instructions. Patient uses sliding scale. Inject 6 units subcutaneous, three times daily with meals. Use this dose only when you eat. This is a short-acting insulin for meal coverage, for diabetes.    . INSULIN GLARGINE 100 UNIT/ML [place] SOLN Subcutaneous Inject 30 Units into the skin at bedtime.    [name] QUETIAPINE FUMARATE 200 MG PO TABS Oral Take 200 mg by mouth at bedtime.     . TRAZODONE HCL 100 MG PO TABS Oral Take 2 tablets (200 mg total) by mouth at bedtime. For sleep 30 tablet 0    BP 122/74  Pulse 99  Temp 98 F (36.7 C)  Resp 18  SpO2 95%  Physical Exam  Nursing note and vitals reviewed. Constitutional: He appears well-developed and well-nourished. No distress.  HENT:  Head: Normocephalic and atraumatic.  Eyes: Conjunctivae are normal. Right eye exhibits no discharge. Left eye exhibits no discharge.  Neck: Neck supple.  Cardiovascular: Normal rate, regular  rhythm and normal heart sounds.  Exam reveals no gallop and no friction rub.   No murmur heard. Pulmonary/Chest: Effort normal and breath sounds normal. No respiratory distress.  Musculoskeletal: He exhibits no edema and no tenderness.       Lower extremities grossly normal in appearance and symmetric as compared to each other. Patient with very mild tenderness over the lateral aspect of his right knee. No effusion appreciated. No significant overlying skin changes. No tenderness posteriorly. No ligamentous laxity. Neurovascular intact distally.  Neurological: He is alert.  Skin: Skin is warm and dry.       Multiple small, nonspecific lesion to bilateral hands forearms in  various stages of healing.  Psychiatric: He has a normal mood and affect. His behavior is normal. Thought content normal.    ED Course  Procedures (including critical care time)  Labs Reviewed - No data to display No results found.   1. Right knee pain       MDM  26 year old male with right knee pain. No history of trauma. Exam fairly unremarkable. Feel that imaging of very little utility given extremely low suspicion for fracture. No ligamentous laxity appreciated either. Patient requested crutches which I feel is reasonable. Patient is also requesting Vicodin. Admitted already has prescriptions for narcotics. Per review of records, patient also has a history of drug overdose most recently back in August. Feel that patient would benefit from NSAIDs more than anything else. Explained this to him. We'll give or so referral. Return precautions were discussed.        Raeford Razor, MD 03/13/12 [phone]

## 2012-03-21 ENCOUNTER — Emergency Department (INDEPENDENT_AMBULATORY_CARE_PROVIDER_SITE_OTHER)
Admission: EM | Admit: 2012-03-21 | Discharge: 2012-03-21 | Disposition: A | Discharge status: Home Health | Payer: Medicare Other | Source: Home / Self Care | Attending: Emergency Medicine | Admitting: Emergency Medicine

## 2012-03-21 ENCOUNTER — Encounter (HOSPITAL_COMMUNITY): Payer: Self-pay

## 2012-03-21 DIAGNOSIS — M25561 Pain in right knee: Secondary | ICD-10-CM

## 2012-03-21 DIAGNOSIS — M25569 Pain in unspecified knee: Secondary | ICD-10-CM

## 2012-03-21 DIAGNOSIS — G8929 Other chronic pain: Secondary | ICD-10-CM

## 2012-03-21 MED ORDER — MELOXICAM 7.5 MG PO TABS: 7.5000 mg | ORAL_TABLET | Freq: Every day | ORAL | Status: DC

## 2012-03-21 NOTE — Discharge Instructions (Signed)
Have recommended today that you see and call your orthopedic provider as they have been treating you for your right knee pain. Your exam today does not suggest a you have a fracture dislocation or ligament or meniscal tear. We also discuss pain management and have encouraged you to take this medicine for the next 2 weeks and followup with your primary care Dr.     Chronic Pain Chronic pain can be defined as pain that is lasting, off and on, and lasts for 3 to 6 months or longer. Many things cause chronic pain, which can make it difficult to make a discrete diagnosis. There are many treatment options available for chronic pain. However, finding a treatment that works well for you may require trying various approaches until a suitable one is found. CAUSES  In some types of chronic medical conditions, the pain is caused by a normal pain response within the body. A normal pain response helps the body identify illness or injury and prevent further damage from being done. In these cases, the cause of the pain may be identified and treated, even if it may not be cured completely. Examples of chronic conditions which can cause chronic pain include:  Inflammation of the joints (arthritis).   Back pain or neck pain (including bulging or herniated disks).   Migraine headaches.   Cancer.  In some other types of chronic pain syndromes, the pain is caused by an abnormal pain response within the body. An abnormal pain response is present when there is no ongoing cause (or stimulus) for the pain, or when the cause of the pain is arising from the nerves or nervous system itself. Examples of conditions which can cause chronic pain due to an abnormal pain response include:  Fibromyalgia.   Reflex sympathetic dystrophy (RSD).   Neuropathy (when the nerves themselves are damaged, and may cause pain).  DIAGNOSIS  Your caregiver will help diagnose your condition over time. In many cases, the initial focus will  be on excluding conditions that could be causing the pain. Depending on your symptoms, your caregiver may order some tests to diagnose your condition. Some of these tests include:  Blood tests.   Computerized X-ray scans (CT scan).   Computerized magnetic scans (MRI).   X-rays.   Ultrasounds.   Nerve conduction studies.   Consultation with other physicians or specialists.  TREATMENT  There are many treatment options for people suffering from chronic pain. Finding a treatment that works well may take time.   You may be referred to a pain management specialist.   You may be put on medication to help with the pain. Unfortunately, some medications (such as opiate medications) may not be very effective in cases where chronic pain is due to abnormal pain responses. Finding the right medications can take some time.   Adjunctive therapies may be used to provide additional relief and improve a patient's quality of life. These therapies include:   Mindfulness meditation.   Acupuncture.   Biofeedback.   Cognitive-behavioral therapy.   In certain cases, surgical interventions may be attempted.  HOME CARE INSTRUCTIONS   Make sure you understand these instructions prior to discharge.   Ask any questions and share any further concerns you have with your caregiver prior to discharge.   Take all medications as directed by your caregiver.   Keep all follow-up appointments.  SEEK MEDICAL CARE IF:   Your pain gets worse.   You develop a new pain that was not present  before.   You cannot tolerate any medications prescribed by your caregiver.   You develop new symptoms since your last visit with your caregiver.  SEEK IMMEDIATE MEDICAL CARE IF:   You develop muscular weakness.   You have decreased sensation or numbness.   You lose control of bowel or bladder function.   Your pain suddenly gets much worse.   You have an oral temperature above 102 F (38.9 C), not controlled by  medication.   You develop shaking chills, confusion, chest pain, or shortness of breath.  Document Released: 08/26/2002 Document Revised: 11/23/2011 Document Reviewed: 12/02/2008 [facility] Patient Information 2012 [place], [place].

## 2012-03-21 NOTE — ED Provider Notes (Signed)
History     CSN: [id number]  Arrival date & time 03/21/12  1026   First MD Initiated Contact with Patient 03/21/12 1104      Chief Complaint  Patient presents with  . Knee Pain    (Consider location/radiation/quality/duration/timing/severity/associated sxs/prior treatment) HPI Comments: I continue to have pain on my right knee (points to the lateral aspect of right knee) I have seen a doctor in the emergency department and I also followup with an orthopedic doctor not the usual Dr. I see but another one. They provided me with his knee splint but is still hurting, and decided to come in today to have a second look and to treat my pain.  I have a doctor's appointment on April 19 with a provider at [facility] I have been seen there before but they a sign and median you Dr. My sugars have been doing better they go from 140-210 been taking them lately. Pain gets worse with movements and walking when I been my knee have not sustained any new injuries. It all started couple weeks ago as he was just walking down hill.  Patient is a 26 y.o. male presenting with knee pain. The history is provided by the patient.  Knee Pain This is a recurrent problem. The current episode started more than 1 week ago. The problem occurs constantly. The problem has not changed since onset.The symptoms are aggravated by walking (movement). The symptoms are relieved by nothing. Treatments tried: knee splint by orthopedic provider-    Past Medical History  Diagnosis Date  . Diabetes mellitus   . Depression   . Schizo affective schizophrenia   . Polysubstance abuse     Past Surgical History  Procedure Date  . I&d extremity 12/19/2011    Procedure: IRRIGATION AND DEBRIDEMENT EXTREMITY;  Surgeon: Sharma Covert;  Location: [facility] OR;  Service: Orthopedics;  Laterality: Left;    No family history on file.  History  Substance Use Topics  . Smoking status: Current Some Day Smoker -- 0.5 packs/day  . Smokeless  tobacco: Not on file  . Alcohol Use: No      Review of Systems  Constitutional: Negative for fever, activity change and appetite change.  Musculoskeletal: Negative for joint swelling.  Skin: Negative for rash and wound.  Neurological: Negative for weakness and numbness.    Allergies  Percocet; Sulfa antibiotics; and Sulfa antibiotics  Home Medications   Current Outpatient Rx  Name Route Sig Dispense Refill  . BENZTROPINE MESYLATE 1 MG PO TABS Oral Take 1 tablet (1 mg total) by mouth 2 (two) times daily. To prevent EPS symptoms. 60 tablet 0  . CLONAZEPAM 1 MG PO TABS Oral Take 1 tablet (1 mg total) by mouth 3 (three) times daily at 8am, 2pm and bedtime. For anxiety. 90 tablet 0  . FLUPHENAZINE HCL 5 MG PO TABS Oral Take 5 mg by mouth 2 (two) times daily in the am and at bedtime.. For psychosis/voices.    [name] HYDROMORPHONE HCL 2 MG PO TABS Oral Take 2 mg by mouth every 4 (four) hours as needed. For pain    . IBUPROFEN 800 MG PO TABS Oral Take 800 mg by mouth 2 (two) times daily as needed. For pain    . INSULIN ASPART 100 UNIT/ML [place] SOLN Subcutaneous Inject 6 Units into the skin See admin instructions. Patient uses sliding scale. Inject 6 units subcutaneous, three times daily with meals. Use this dose only when you eat. This is a short-acting insulin  for meal coverage, for diabetes.    . INSULIN GLARGINE 100 UNIT/ML [place] SOLN Subcutaneous Inject 30 Units into the skin at bedtime.    . MELOXICAM 7.5 MG PO TABS Oral Take 1 tablet (7.5 mg total) by mouth daily. 14 tablet 0  . QUETIAPINE FUMARATE 200 MG PO TABS Oral Take 200 mg by mouth at bedtime.     . TRAZODONE HCL 100 MG PO TABS Oral Take 2 tablets (200 mg total) by mouth at bedtime. For sleep 30 tablet 0    BP 117/81  Pulse 89  Temp(Src) 98.6 F (37 C) (Oral)  Resp 16  SpO2 98%  Physical Exam  Nursing note and vitals reviewed. Constitutional: He appears well-developed and well-nourished.  Eyes: Conjunctivae are normal.    Abdominal: He exhibits no distension.  Musculoskeletal: He exhibits tenderness. He exhibits no edema.       Right shoulder: He exhibits tenderness and pain. He exhibits normal range of motion, no swelling, no effusion, no crepitus, no deformity, normal pulse and normal strength.       Right knee: He exhibits bony tenderness. He exhibits no swelling, no effusion, no ecchymosis, no deformity, no erythema, normal alignment, no LCL laxity, normal patellar mobility, normal meniscus and no MCL laxity. tenderness found. Lateral joint line tenderness noted. No medial joint line, no MCL, no LCL and no patellar tendon tenderness noted.       Legs: Neurological: He is alert.  Skin: No rash noted. No erythema.    ED Course  Procedures (including critical care time)  Labs Reviewed - No data to display No results found.   1. Chronic pain   2. Knee pain, right       MDM          Jimmie Molly, MD 03/21/12 1200

## 2012-03-21 NOTE — ED Notes (Addendum)
Pt c/o R knee pain.  Pt seen at ED and FU with Ortho given crutches and pain meds, Pt states he is unable to use crutches and pain meds aren't working.  Pt also c/o muscle spasms to R calf.

## 2012-04-16 ENCOUNTER — Emergency Department (HOSPITAL_COMMUNITY)
Admission: EM | Admit: 2012-04-16 | Discharge: 2012-04-17 | Disposition: A | Discharge status: Home / Self Care | Payer: Medicare Other | Attending: Emergency Medicine | Admitting: Emergency Medicine

## 2012-04-16 ENCOUNTER — Encounter (HOSPITAL_COMMUNITY): Payer: Self-pay | Admitting: *Deleted

## 2012-04-16 DIAGNOSIS — Z794 Long term (current) use of insulin: Secondary | ICD-10-CM | POA: Insufficient documentation

## 2012-04-16 DIAGNOSIS — F329 Major depressive disorder, single episode, unspecified: Secondary | ICD-10-CM | POA: Insufficient documentation

## 2012-04-16 DIAGNOSIS — Z91199 Patient's noncompliance with other medical treatment and regimen due to unspecified reason: Secondary | ICD-10-CM | POA: Insufficient documentation

## 2012-04-16 DIAGNOSIS — Z9119 Patient's noncompliance with other medical treatment and regimen: Secondary | ICD-10-CM | POA: Insufficient documentation

## 2012-04-16 DIAGNOSIS — Z79899 Other long term (current) drug therapy: Secondary | ICD-10-CM | POA: Insufficient documentation

## 2012-04-16 DIAGNOSIS — Z8659 Personal history of other mental and behavioral disorders: Secondary | ICD-10-CM | POA: Insufficient documentation

## 2012-04-16 DIAGNOSIS — Z760 Encounter for issue of repeat prescription: Secondary | ICD-10-CM | POA: Insufficient documentation

## 2012-04-16 DIAGNOSIS — R358 Other polyuria: Secondary | ICD-10-CM | POA: Insufficient documentation

## 2012-04-16 DIAGNOSIS — R3589 Other polyuria: Secondary | ICD-10-CM | POA: Insufficient documentation

## 2012-04-16 DIAGNOSIS — R5383 Other fatigue: Secondary | ICD-10-CM | POA: Insufficient documentation

## 2012-04-16 DIAGNOSIS — R631 Polydipsia: Secondary | ICD-10-CM | POA: Insufficient documentation

## 2012-04-16 DIAGNOSIS — E119 Type 2 diabetes mellitus without complications: Secondary | ICD-10-CM | POA: Insufficient documentation

## 2012-04-16 DIAGNOSIS — F3289 Other specified depressive episodes: Secondary | ICD-10-CM | POA: Insufficient documentation

## 2012-04-16 DIAGNOSIS — R739 Hyperglycemia, unspecified: Secondary | ICD-10-CM

## 2012-04-16 DIAGNOSIS — R5381 Other malaise: Secondary | ICD-10-CM | POA: Insufficient documentation

## 2012-04-16 DIAGNOSIS — Z9114 Patient's other noncompliance with medication regimen: Secondary | ICD-10-CM

## 2012-04-16 HISTORY — DX: Scoliosis, unspecified: M41.9

## 2012-04-16 LAB — GLUCOSE, CAPILLARY: Glucose-Capillary: 526 mg/dL — ABNORMAL HIGH (ref 70–99)

## 2012-04-16 NOTE — ED Notes (Signed)
Per EMS: pt c/o weakness x 1 hour.  Unable to get to apt where insulin was.  Feeling flushed all over, CBG > 600.

## 2012-04-17 LAB — COMPREHENSIVE METABOLIC PANEL
ALT: 12 U/L (ref 0–53)
AST: 16 U/L (ref 0–37)
Albumin: 4.3 g/dL (ref 3.5–5.2)
CO2: 29 mEq/L (ref 19–32)
Calcium: 9.4 mg/dL (ref 8.4–10.5)
Chloride: 95 mEq/L — ABNORMAL LOW (ref 96–112)
Creatinine, Ser: 0.55 mg/dL (ref 0.50–1.35)
Sodium: 135 mEq/L (ref 135–145)

## 2012-04-17 LAB — RAPID URINE DRUG SCREEN, HOSP PERFORMED
Barbiturates: NOT DETECTED
Benzodiazepines: NOT DETECTED
Cocaine: NOT DETECTED
Tetrahydrocannabinol: POSITIVE — AB

## 2012-04-17 LAB — CBC
HCT: 41.2 % (ref 39.0–52.0)
MCHC: 35.9 g/dL (ref 30.0–36.0)
Platelets: 263 10*3/uL (ref 150–400)
RDW: 12.1 % (ref 11.5–15.5)
WBC: 9.6 10*3/uL (ref 4.0–10.5)

## 2012-04-17 LAB — URINALYSIS, ROUTINE W REFLEX MICROSCOPIC
Nitrite: NEGATIVE
Protein, ur: NEGATIVE mg/dL
Specific Gravity, Urine: 1.01 (ref 1.005–1.030)
Urobilinogen, UA: 1 mg/dL (ref 0.0–1.0)

## 2012-04-17 LAB — DIFFERENTIAL
Basophils Absolute: 0.1 10*3/uL (ref 0.0–0.1)
Basophils Relative: 1 % (ref 0–1)
Eosinophils Relative: 1 % (ref 0–5)
Lymphocytes Relative: 23 % (ref 12–46)
Monocytes Absolute: 0.8 10*3/uL (ref 0.1–1.0)
Neutro Abs: 6.4 10*3/uL (ref 1.7–7.7)

## 2012-04-17 LAB — URINE MICROSCOPIC-ADD ON

## 2012-04-17 MED ORDER — SODIUM CHLORIDE 0.9 % IV BOLUS (SEPSIS)
2000.0000 mL | Freq: Once | INTRAVENOUS | Status: AC
  Administered 2012-04-17: 2000 mL via INTRAVENOUS

## 2012-04-17 MED ORDER — TRAZODONE HCL 100 MG PO TABS: 200.0000 mg | ORAL_TABLET | Freq: Every day | ORAL | Status: DC

## 2012-04-17 MED ORDER — GLUCOSE BLOOD VI STRP: ORAL_STRIP | Status: DC

## 2012-04-17 MED ORDER — MORPHINE SULFATE 4 MG/ML IJ SOLN: 4.0000 mg | Freq: Once | INTRAMUSCULAR | Status: DC

## 2012-04-17 MED ORDER — BENZTROPINE MESYLATE 1 MG PO TABS: 1.0000 mg | ORAL_TABLET | Freq: Two times a day (BID) | ORAL | Status: DC

## 2012-04-17 MED ORDER — MORPHINE SULFATE 4 MG/ML IJ SOLN
4.0000 mg | Freq: Once | INTRAMUSCULAR | Status: AC
  Administered 2012-04-17: 4 mg via INTRAMUSCULAR
  Filled 2012-04-17: qty 1

## 2012-04-17 MED ORDER — INSULIN GLARGINE 100 UNIT/ML ~~LOC~~ SOLN: 30.0000 [IU] | Freq: Every day | SUBCUTANEOUS | Status: DC

## 2012-04-17 MED ORDER — MORPHINE SULFATE 4 MG/ML IJ SOLN
4.0000 mg | Freq: Once | INTRAMUSCULAR | Status: AC
  Administered 2012-04-17: 4 mg via INTRAVENOUS
  Filled 2012-04-17: qty 1

## 2012-04-17 MED ORDER — FLUPHENAZINE HCL 5 MG PO TABS: 5.0000 mg | ORAL_TABLET | Freq: Three times a day (TID) | ORAL | Status: DC

## 2012-04-17 MED ORDER — FREESTYLE SYSTEM KIT: 1.0000 | PACK | Status: DC | PRN

## 2012-04-17 MED ORDER — INSULIN ASPART 100 UNIT/ML ~~LOC~~ SOLN: 10.0000 [IU] | Freq: Three times a day (TID) | SUBCUTANEOUS | Status: DC

## 2012-04-17 NOTE — ED Notes (Signed)
Pt aware that we need a urine specimen.  

## 2012-04-17 NOTE — ED Provider Notes (Signed)
History     CSN: [id number]  Arrival date & time 04/16/12  2228   First MD Initiated Contact with Patient 04/17/12 0031      Chief Complaint  Patient presents with  . Hyperglycemia    (Consider location/radiation/quality/duration/timing/severity/associated sxs/prior treatment) The history is provided by the patient.   the patient reports generalized weakness today.  He has no focal weakness complaints.  He denies headache.  He reports noncompliance with his insulin because he ran out of her last month.  He reports his blood sugars a bit high.  His had polyuria and polydipsia.  The patient denies fevers or chills.  He denies chest pain or shortness of breath.  He has no abdominal pain.  He denies nausea vomiting diarrhea.  He denies melena and hematochezia.  He has no other complaints this time.  He reports he just received his Medicaid insurance and will be developing relationship with a primary care doctor soon.  He does report that he wants to change his current lifestyle and get his blood sugars under control.  He request refills of all of his medications including a new glucometer and glucose strips.  His symptoms are moderate in severity  Past Medical History  Diagnosis Date  . Diabetes mellitus   . Depression   . Schizo affective schizophrenia   . Polysubstance abuse   . Scoliosis     Past Surgical History  Procedure Date  . I&d extremity 12/19/2011    Procedure: IRRIGATION AND DEBRIDEMENT EXTREMITY;  Surgeon: Sharma Covert;  Location: [facility] OR;  Service: Orthopedics;  Laterality: Left;    History reviewed. No pertinent family history.  History  Substance Use Topics  . Smoking status: Current Some Day Smoker -- 0.5 packs/day  . Smokeless tobacco: Not on file  . Alcohol Use: No      Review of Systems  All other systems reviewed and are negative.    Allergies  Percocet; Sulfa antibiotics; and Sulfa antibiotics  Home Medications   Current Outpatient Rx  Name  Route Sig Dispense Refill  . CLONAZEPAM 1 MG PO TABS Oral Take 1 tablet (1 mg total) by mouth 3 (three) times daily at 8am, 2pm and bedtime. For anxiety. 90 tablet 0  . BENZTROPINE MESYLATE 1 MG PO TABS Oral Take 1 tablet (1 mg total) by mouth 2 (two) times daily. To prevent EPS symptoms. 60 tablet 1  . FLUPHENAZINE HCL 5 MG PO TABS Oral Take 1 tablet (5 mg total) by mouth 3 (three) times daily. For psychosis/voices. 90 tablet 1  . GLUCOSE BLOOD VI STRP  Use as instructed 100 each 12  . FREESTYLE SYSTEM KIT Does not apply 1 each by Does not apply route as needed for other. 1 each 0  . INSULIN ASPART 100 UNIT/ML [place] SOLN Subcutaneous Inject 10-18 Units into the skin 3 (three) times daily before meals. Patient uses sliding scale. 1 vial 3  . INSULIN GLARGINE 100 UNIT/ML [place] SOLN Subcutaneous Inject 30 Units into the skin at bedtime. 10 mL 3  . TRAZODONE HCL 100 MG PO TABS Oral Take 2 tablets (200 mg total) by mouth at bedtime. For sleep 30 tablet 1    BP 129/90  Pulse 85  Temp(Src) 98.2 F (36.8 C) (Oral)  Resp 22  SpO2 100%  Physical Exam  Nursing note and vitals reviewed. Constitutional: He is oriented to person, place, and time. He appears well-developed and well-nourished.  HENT:  Head: Normocephalic and atraumatic.  Eyes: EOM are  normal.  Neck: Normal range of motion.  Cardiovascular: Normal rate, regular rhythm, normal heart sounds and intact distal pulses.   Pulmonary/Chest: Effort normal and breath sounds normal. No respiratory distress.  Abdominal: Soft. He exhibits no distension. There is no tenderness.  Musculoskeletal: Normal range of motion.  Neurological: He is alert and oriented to person, place, and time.  Skin: Skin is warm and dry.  Psychiatric: He has a normal mood and affect. Judgment normal.    ED Course  Procedures (including critical care time)  Labs Reviewed  URINALYSIS, ROUTINE W REFLEX MICROSCOPIC - Abnormal; Notable for the following:    Glucose, UA >1000  (*)    Ketones, ur >80 (*)    All other components within normal limits  GLUCOSE, CAPILLARY - Abnormal; Notable for the following:    Glucose-Capillary 526 (*)    All other components within normal limits  COMPREHENSIVE METABOLIC PANEL - Abnormal; Notable for the following:    Chloride 95 (*)    Glucose, Bld 396 (*)    All other components within normal limits  CBC  DIFFERENTIAL  URINE MICROSCOPIC-ADD ON  URINE RAPID DRUG SCREEN (HOSP PERFORMED)   No results found.   1. Hyperglycemia   2. H/O medication noncompliance   3. Medication refill       MDM  The patient's medications were refilled.  Hyperglycemia secondary to insulin noncompliance.  No evidence of DKA.  Hydrated in the emergency department with several liters of fluid.  Discharge home with primary care all up.  At this time he does not have her primary care physician but he just received his Medicaid and we'll be searching to find a primary care physician.  He understands to return to the ER for new or worsening symptoms.  Medication refill.        Lyanne Co, MD 04/17/12 [phone]

## 2012-04-17 NOTE — Discharge Instructions (Signed)
Diabetes, Type 2, Am I At Risk?  Diabetes is a lasting (chronic) disease. In type 2 diabetes, the pancreas does not make enough insulin, and the body does not respond normally to the insulin that is made. This type of diabetes was also previously called adult onset diabetes. About 90% of all those who have diabetes have type 2. It usually occurs after the age of 40, but can occur at any age.    People develop type 2 diabetes because they do not use insulin properly. Eventually, the pancreas cannot make enough insulin for the body's needs. Over time, the amount of glucose (sugar) in the blood increases.  RISK FACTORS   Overweight - the more weight you have, the more resistant your cells become to insulin.    Family history - you are more likely to get diabetes if a parent or sibling has diabetes.    Race - certain races get diabetes more.    African Americans.    American Indians.    Asian Americans.    Hispanics.    Pacific Islander.    Inactive - exercise helps control weight and helps your cells be more sensitive to insulin.    Gestational diabetes - some women develop diabetes while they are pregnant. This goes away when they deliver. However, they are 50-60% more likely to develop type 2 diabetes at a later time.    Having a baby over 9 pounds - a sign that you may have had gestational diabetes.    Age - the risk of diabetes goes up as you get older, especially after age 45.    High blood pressure (hypertension).   SYMPTOMS  Many people have no signs or symptoms. Symptoms can be so mild that you might not even notice them. Some of these signs are:   Increased thirst.    Increased hunger.    Tiredness (fatigue).    Increased urination, especially at night.    Weight loss.    Blurred vision.    Sores that do not heal.   WHO SHOULD BE TESTED?   Anyone 45 years or older, especially if overweight, should consider getting tested.     If you are younger than 45, overweight, and have one or more of the risk factors, you should consider getting tested.   DIAGNOSIS   Fasting blood glucose (FBS). Usually, 2 are done.    FBS 101-125 mg/dl is considered pre-diabetes.    FBS 126 mg/dl or greater is considered diabetes.    2 hour Oral Glucose Tolerance Test (OGTT). This test is preformed by first having you not eat or drink for several hours. You are then given something sweet to drink and your blood glucose is measured fasting, at one hour and 2 hours. This test tells how well you are able to handle sugars or carbohydrates.    Fasting: 60-100 mg/dl.    1 hour: less than 200 mg/dl.    2 hours: less than 140 mg/dl.    A1c -A1c is a blood glucose test that gives and average of your blood glucose over 3 months. It is the accepted method to use to diagnose diabetes.    A1c 5.7-6.4% is considered pre-diabetes.    A1c 6.5% or greater is considered diabetes.   WHAT DOES IT MEAN TO HAVE PRE-DIABETES?  Pre-diabetes means you are at risk for getting type 2 diabetes. Your blood glucose is higher than normal, but not yet high enough to diagnose diabetes. The good news   is, if you have pre-diabetes you can reduce the risk of getting diabetes and even return to normal blood glucose levels. With modest weight loss and moderate physical activity, you can delay or prevent type 2 diabetes.    PREVENTION  You cannot do anything about race, age or family history, but you can lower your chances of getting diabetes. You can:     Exercise regularly and be active.    Reduce fat and calorie intake.    Make wise food choices as much as you can.    Reduce your intake of salt and alcohol.    Maintain a reasonable weight.    Keep blood pressure in an acceptable range. Take medication if needed.    Not smoke.    Maintain an acceptable cholesterol level (HDL, LDL, Triglycerides). Take medication if needed.   DOING MY PART: GETTING STARTED   Making big changes in your life is hard, especially if you are faced with more than one change. You can make it easier by taking these steps:   Make a plan to change behavior.    Decide exactly what you will do and when you will do it.    Plan what you need to get ready.    Think about what might prevent you from reaching your goals.    Find family and friends who will support and encourage you.    Decide how you will reward yourself when you do what you have planned.    Your doctor, dietitian, or counselor can help you make a plan.   HERE ARE SOME OF THE AREAS YOU MAY WISH TO CHANGE TO REDUCE YOUR RISK OF DIABETES.  If you are overweight or obese, choose sensible ways to get in shape. Even small amounts of weight loss, like 5-10 pounds, can help reduce the effects of insulin resistance and help blood glucose control.  Diet   Avoid crash diets. Instead, eat less of the foods you usually have. Limit the amount of fat you eat.    Increase your physical activity. Aim for at least 30 minutes of exercise most days of the week.    Set a reasonable weight-loss goal, such as losing 1 pound a week. Aim for a long-term goal of losing 5-7% of your total body weight.    Make wise food choices most of the time.    What you eat has a big impact on your health. By making wise food choices, you can help control your body weight, blood pressure, and cholesterol.    Take a hard look at the serving sizes of the foods you eat. Reduce serving sizes of meat, desserts, and foods high in fat. Increase your intake of fruits and vegetables.    Limit your fat intake to about 25% of your total calories. For example, if your food choices add up to about 2,000 calories a day, try to eat no more than 56 grams of fat. Your caregiver or a dietitian can help you figure out how much fat to have. You can check food labels for fat content too.    You may also want to reduce the number of calories you have each day.     Keep a food log. Write down what you eat, how much you eat, and anything else that helps keep you on track.    When you meet your goal, reward yourself with a nonfood item or activity.   Exercise   Be physically active every day.      Keep and exercise log. Write down what exercise you did, for how long, and anything else that keeps you on track.    Regular exercise (like brisk walking) tackles several risk factors at once. It helps you lose weight, it keeps your cholesterol and blood pressure under control, and it helps your body use insulin. People who are physically active for 30 minutes a day, 5 days a week, reduced their risk of type 2 diabetes. If you are not very active, you should start slowly at first. Talk with your caregiver first about what kinds of exercise would be safe for you. Make a plan to increase your activity level with the goal of being active for at least 30 minutes a day, most days of the week.    Choose activities you enjoy. Here are some ways to work extra activity into your daily routine:    Take the stairs rather than an elevator or escalator.    Park at the far end of the lot and walk.    Get off the bus a few stops early and walk the rest of the way.    Walk or bicycle instead of drive whenever you can.   Medications  Some people need medication to help control their blood pressure or cholesterol levels. If you do, take your medicines as directed. Ask your caregiver whether there are any medicines you can take to prevent type 2 diabetes.  Document Released: 12/07/2003 Document Revised: 11/23/2011 Document Reviewed: 09/01/2009  [employer] Patient Information 2012 [employer].

## 2012-04-25 ENCOUNTER — Emergency Department (HOSPITAL_COMMUNITY)
Admission: EM | Admit: 2012-04-25 | Discharge: 2012-04-26 | Disposition: A | Discharge status: Home / Self Care | Payer: Medicare Other | Attending: Emergency Medicine | Admitting: Emergency Medicine

## 2012-04-25 ENCOUNTER — Encounter (HOSPITAL_COMMUNITY): Payer: Self-pay | Admitting: Emergency Medicine

## 2012-04-25 DIAGNOSIS — E119 Type 2 diabetes mellitus without complications: Secondary | ICD-10-CM | POA: Insufficient documentation

## 2012-04-25 DIAGNOSIS — T50902A Poisoning by unspecified drugs, medicaments and biological substances, intentional self-harm, initial encounter: Secondary | ICD-10-CM

## 2012-04-25 DIAGNOSIS — F329 Major depressive disorder, single episode, unspecified: Secondary | ICD-10-CM

## 2012-04-25 DIAGNOSIS — R000 Tachycardia, unspecified: Secondary | ICD-10-CM | POA: Insufficient documentation

## 2012-04-25 DIAGNOSIS — Z8659 Personal history of other mental and behavioral disorders: Secondary | ICD-10-CM | POA: Insufficient documentation

## 2012-04-25 DIAGNOSIS — F3289 Other specified depressive episodes: Secondary | ICD-10-CM | POA: Insufficient documentation

## 2012-04-25 DIAGNOSIS — G8929 Other chronic pain: Secondary | ICD-10-CM | POA: Insufficient documentation

## 2012-04-25 DIAGNOSIS — Z79899 Other long term (current) drug therapy: Secondary | ICD-10-CM | POA: Insufficient documentation

## 2012-04-25 DIAGNOSIS — R45851 Suicidal ideations: Secondary | ICD-10-CM | POA: Insufficient documentation

## 2012-04-25 DIAGNOSIS — R42 Dizziness and giddiness: Secondary | ICD-10-CM | POA: Insufficient documentation

## 2012-04-25 DIAGNOSIS — T43502A Poisoning by unspecified antipsychotics and neuroleptics, intentional self-harm, initial encounter: Secondary | ICD-10-CM | POA: Insufficient documentation

## 2012-04-25 DIAGNOSIS — T43294A Poisoning by other antidepressants, undetermined, initial encounter: Secondary | ICD-10-CM | POA: Insufficient documentation

## 2012-04-25 DIAGNOSIS — Z794 Long term (current) use of insulin: Secondary | ICD-10-CM | POA: Insufficient documentation

## 2012-04-25 DIAGNOSIS — T43501A Poisoning by unspecified antipsychotics and neuroleptics, accidental (unintentional), initial encounter: Secondary | ICD-10-CM | POA: Insufficient documentation

## 2012-04-25 HISTORY — DX: Other chronic pain: G89.29

## 2012-04-25 LAB — COMPREHENSIVE METABOLIC PANEL
ALT: 7 U/L (ref 0–53)
BUN: 7 mg/dL (ref 6–23)
CO2: 21 mEq/L (ref 19–32)
Calcium: 8.7 mg/dL (ref 8.4–10.5)
GFR calc Af Amer: >90 mL/min (ref >90)
GFR calc non Af Amer: >90 mL/min (ref >90)
Glucose, Bld: 326 mg/dL — ABNORMAL HIGH (ref 70–99)
Sodium: 131 mEq/L — ABNORMAL LOW (ref 135–145)

## 2012-04-25 LAB — DIFFERENTIAL
Eosinophils Relative: 0 % (ref 0–5)
Lymphocytes Relative: 12 % (ref 12–46)
Lymphs Abs: 1.1 10*3/uL (ref 0.7–4.0)
Monocytes Absolute: 0.7 10*3/uL (ref 0.1–1.0)
Monocytes Relative: 7 % (ref 3–12)

## 2012-04-25 LAB — CBC
HCT: 38.2 % — ABNORMAL LOW (ref 39.0–52.0)
Hemoglobin: 13.4 g/dL (ref 13.0–17.0)
MCH: 30 pg (ref 26.0–34.0)
MCV: 85.5 fL (ref 78.0–100.0)
Platelets: 308 10*3/uL (ref 150–400)
RBC: 4.47 MIL/uL (ref 4.22–5.81)
WBC: 9.4 10*3/uL (ref 4.0–10.5)

## 2012-04-25 LAB — RAPID URINE DRUG SCREEN, HOSP PERFORMED
Benzodiazepines: NOT DETECTED
Opiates: NOT DETECTED

## 2012-04-25 LAB — GLUCOSE, CAPILLARY
Glucose-Capillary: 301 mg/dL — ABNORMAL HIGH (ref 70–99)
Glucose-Capillary: 333 mg/dL — ABNORMAL HIGH (ref 70–99)

## 2012-04-25 LAB — ETHANOL: Alcohol, Ethyl (B): <11 mg/dL (ref 0–11)

## 2012-04-25 LAB — PROTIME-INR: Prothrombin Time: 13.9 seconds (ref 11.6–15.2)

## 2012-04-25 MED ORDER — NICOTINE 21 MG/24HR TD PT24
21.0000 mg | MEDICATED_PATCH | Freq: Every day | TRANSDERMAL | Status: DC | PRN
  Administered 2012-04-25: 21 mg via TRANSDERMAL
  Filled 2012-04-25: qty 1

## 2012-04-25 MED ORDER — ACETAMINOPHEN 325 MG PO TABS
650.0000 mg | ORAL_TABLET | ORAL | Status: DC | PRN
  Administered 2012-04-25 – 2012-04-26 (×2): 650 mg via ORAL
  Filled 2012-04-25 (×2): qty 2

## 2012-04-25 MED ORDER — INSULIN ASPART 100 UNIT/ML ~~LOC~~ SOLN
8.0000 [IU] | Freq: Once | SUBCUTANEOUS | Status: AC
  Administered 2012-04-25: 8 [IU] via SUBCUTANEOUS
  Filled 2012-04-25: qty 1

## 2012-04-25 MED ORDER — LORAZEPAM 1 MG PO TABS
1.0000 mg | ORAL_TABLET | Freq: Three times a day (TID) | ORAL | Status: DC | PRN
  Administered 2012-04-25 – 2012-04-26 (×2): 1 mg via ORAL
  Filled 2012-04-25 (×3): qty 1

## 2012-04-25 MED ORDER — TRAZODONE HCL 100 MG PO TABS
200.0000 mg | ORAL_TABLET | Freq: Every day | ORAL | Status: DC
  Administered 2012-04-25: 200 mg via ORAL
  Filled 2012-04-25: qty 2

## 2012-04-25 MED ORDER — DIPHENHYDRAMINE HCL 25 MG PO CAPS
25.0000 mg | ORAL_CAPSULE | Freq: Once | ORAL | Status: AC
  Administered 2012-04-25: 25 mg via ORAL
  Filled 2012-04-25: qty 1

## 2012-04-25 MED ORDER — IBUPROFEN 200 MG PO TABS
400.0000 mg | ORAL_TABLET | Freq: Three times a day (TID) | ORAL | Status: DC | PRN
  Filled 2012-04-25: qty 2

## 2012-04-25 MED ORDER — INSULIN GLARGINE 100 UNIT/ML ~~LOC~~ SOLN
30.0000 [IU] | Freq: Every day | SUBCUTANEOUS | Status: DC
  Administered 2012-04-25: 30 [IU] via SUBCUTANEOUS
  Filled 2012-04-25: qty 1

## 2012-04-25 MED ORDER — FLUPHENAZINE HCL 5 MG PO TABS
5.0000 mg | ORAL_TABLET | Freq: Three times a day (TID) | ORAL | Status: DC
  Administered 2012-04-25 – 2012-04-26 (×4): 5 mg via ORAL
  Filled 2012-04-25 (×6): qty 1

## 2012-04-25 MED ORDER — INSULIN ASPART 100 UNIT/ML ~~LOC~~ SOLN: 10.0000 [IU] | Freq: Three times a day (TID) | SUBCUTANEOUS | Status: DC

## 2012-04-25 MED ORDER — ZOLPIDEM TARTRATE 5 MG PO TABS: 5.0000 mg | ORAL_TABLET | Freq: Every evening | ORAL | Status: DC | PRN

## 2012-04-25 MED ORDER — ALUM & MAG HYDROXIDE-SIMETH 200-200-20 MG/5ML PO SUSP: 30.0000 mL | ORAL | Status: DC | PRN

## 2012-04-25 MED ORDER — BENZTROPINE MESYLATE 1 MG PO TABS
1.0000 mg | ORAL_TABLET | Freq: Two times a day (BID) | ORAL | Status: DC
  Administered 2012-04-25 – 2012-04-26 (×3): 1 mg via ORAL
  Filled 2012-04-25 (×3): qty 1

## 2012-04-25 MED ORDER — INSULIN REGULAR HUMAN 100 UNIT/ML IJ SOLN: 8.0000 [IU] | Freq: Once | INTRAMUSCULAR | Status: DC

## 2012-04-25 MED ORDER — ONDANSETRON HCL 4 MG PO TABS: 4.0000 mg | ORAL_TABLET | Freq: Three times a day (TID) | ORAL | Status: DC | PRN

## 2012-04-25 MED ORDER — INSULIN GLARGINE 100 UNIT/ML ~~LOC~~ SOLN: 30.0000 [IU] | Freq: Every day | SUBCUTANEOUS | Status: DC

## 2012-04-25 MED ORDER — INSULIN ASPART 100 UNIT/ML ~~LOC~~ SOLN
5.0000 [IU] | Freq: Three times a day (TID) | SUBCUTANEOUS | Status: DC
  Administered 2012-04-25 – 2012-04-26 (×3): 5 [IU] via SUBCUTANEOUS
  Filled 2012-04-25 (×3): qty 1

## 2012-04-25 NOTE — ED Notes (Signed)
Report received from Previous RN. First contact with patient. Pt is alert, oriented, insulin given, admit to SI, and hallucination, no HI. Cooperative and calm with [place]

## 2012-04-25 NOTE — ED Notes (Addendum)
Pt sts "I tried to kill myself last night." reportedly took approx 15 geodon 80mg  and "at least" 10 trazodone 100mg  at 8-9pm last night. Sts when he went to breakfast this morning, he felt faint, like he was going to pass out. Was at the [facility] and could not lie down so he called ems because he didn't want to pass out. Pt alert and oriented. Denies complaints r/t OD. Sts some sob x3 days, only when taking deep breath r/t congestion and cough. Remi Deter, [place], now at bedside. Sts SI for past few days, denies HI. Sts hearing voices since yesterday, telling him to kill self.

## 2012-04-25 NOTE — ED Notes (Signed)
Per EMS, pt took unknown amt trazadone and geodon because the "voices told him to kill himself". Per EMS, pt felt faint this am and called EMS.

## 2012-04-25 NOTE — ED Provider Notes (Signed)
History     CSN: [id number]  Arrival date & time 04/25/12  1108   First MD Initiated Contact with Patient 04/25/12 1115      Chief Complaint  Patient presents with  . Suicide Attempt    HPI Pt was seen at 1125.  Per pt, c/o gradual onset and worsening of persistent depression and SI for the past several days.  Pt with SA last night.  States he took "15 geodon and 10 trazodone" tabs last night approx 2000.  States he did so because "the voices told him to kill himself."  States he felt "Lightheaded" this morning, so he called EMS.  Endorses he has not eaten today and is hungry now.  Denies taking any other meds, no abd pain, no N/V/D, no CP/SOB.    Past Medical History  Diagnosis Date  . Diabetes mellitus   . Depression   . Schizo affective schizophrenia   . Polysubstance abuse   . Scoliosis   . Chronic pain     Past Surgical History  Procedure Date  . I&d extremity 12/19/2011    Procedure: IRRIGATION AND DEBRIDEMENT EXTREMITY;  Surgeon: Sharma Covert;  Location: [facility] OR;  Service: Orthopedics;  Laterality: Left;   History  Substance Use Topics  . Smoking status: Current Some Day Smoker -- 0.5 packs/day  . Smokeless tobacco: Not on file  . Alcohol Use: No    Review of Systems ROS: Statement: All systems negative except as marked or noted in the HPI; Constitutional: Negative for fever and chills. ; ; Eyes: Negative for eye pain, redness and discharge. ; ; ENMT: Negative for ear pain, hoarseness, nasal congestion, sinus pressure and sore throat. ; ; Cardiovascular: Negative for chest pain, palpitations, diaphoresis, dyspnea and peripheral edema. ; ; Respiratory: Negative for cough, wheezing and stridor. ; ; Gastrointestinal: Negative for nausea, vomiting, diarrhea, abdominal pain, blood in stool, hematemesis, jaundice and rectal bleeding. . ; ; Genitourinary: Negative for dysuria, flank pain and hematuria. ; ; Musculoskeletal: Negative for back pain and neck pain. Negative for  swelling and trauma.; ; Skin: Negative for pruritus, rash, abrasions, blisters, bruising and skin lesion.; ; Neuro: +lightheadedness.  Negative for headache and neck stiffness. Negative for weakness, altered level of consciousness , altered mental status, extremity weakness, paresthesias, involuntary movement, seizure and syncope.; Psych:  +SI, +SA, no HI, +auditory hallucinations.   Allergies  Percocet; Sulfa antibiotics; and Sulfa antibiotics  Home Medications   Current Outpatient Rx  Name Route Sig Dispense Refill  . BENZTROPINE MESYLATE 1 MG PO TABS Oral Take 1 tablet (1 mg total) by mouth 2 (two) times daily. To prevent EPS symptoms. 60 tablet 1  . CLONAZEPAM 1 MG PO TABS Oral Take 1 tablet (1 mg total) by mouth 3 (three) times daily at 8am, 2pm and bedtime. For anxiety. 90 tablet 0  . FLUPHENAZINE HCL 5 MG PO TABS Oral Take 5 mg by mouth 3 (three) times daily. For psychosis/voices.    [name] GLUCOSE BLOOD VI STRP  Use as instructed 100 each 12  . FREESTYLE SYSTEM KIT Does not apply 1 each by Does not apply route as needed for other. 1 each 0  . INSULIN ASPART 100 UNIT/ML [place] SOLN Subcutaneous Inject 10-18 Units into the skin 3 (three) times daily before meals. Patient uses sliding scale. 1 vial 3  . INSULIN GLARGINE 100 UNIT/ML [place] SOLN Subcutaneous Inject 30 Units into the skin at bedtime. 10 mL 3  . TRAZODONE HCL 100 MG PO  TABS Oral Take 2 tablets (200 mg total) by mouth at bedtime. For sleep 30 tablet 1    BP 119/80  Pulse 98  Temp(Src) 98.8 F (37.1 C) (Oral)  Resp 20  SpO2 100%  Physical Exam 1130: Physical examination:  Nursing notes reviewed; Vital signs and O2 SAT reviewed;  Constitutional: Thin, Well hydrated, In no acute distress; Head:  Normocephalic, atraumatic; Eyes: EOMI, PERRL, No scleral icterus; ENMT: Mouth and pharynx normal, Mucous membranes moist; Neck: Supple, Full range of motion, No lymphadenopathy; Cardiovascular: Tachycardic rate and rhythm, No murmur or gallop;  Respiratory: Breath sounds clear & equal bilaterally, No rales, rhonchi, wheezes, Normal respiratory effort/excursion; Chest: Nontender, Movement normal; Abdomen: Soft, Nontender, Nondistended, Normal bowel sounds; Extremities: Pulses normal, No tenderness, No edema, No calf edema or asymmetry.; Neuro: AA&Ox3, Major CN grossly intact. Speech clear, no facial droop. No gross focal motor or sensory deficits in extremities.; Skin: Color normal, Warm, Dry; Psych:  Affect flat, +SI.   ED Course  Procedures   MDM  MDM Reviewed: nursing note, vitals and previous chart Interpretation: labs    Date: 04/25/2012  Rate: 108  Rhythm: sinus tachycardia  QRS Axis: normal  Intervals: normal  ST/T Wave abnormalities: normal  Conduction Disutrbances:none  Narrative Interpretation:   Old EKG Reviewed: unchanged; no significant changes from previous EKG dated 08/19/2011.   Results for orders placed during the hospital encounter of 04/25/12  ACETAMINOPHEN LEVEL      Component Value Range   Acetaminophen (Tylenol), Serum <15.0  10 - 30 (ug/mL)  APTT      Component Value Range   aPTT 33  24 - 37 (seconds)  CBC      Component Value Range   WBC 9.4  4.0 - 10.5 (K/uL)   RBC 4.47  4.22 - 5.81 (MIL/uL)   Hemoglobin 13.4  13.0 - 17.0 (g/dL)   HCT 16.1 (*) 09.6 - 52.0 (%)   MCV 85.5  78.0 - 100.0 (fL)   MCH 30.0  26.0 - 34.0 (pg)   MCHC 35.1  30.0 - 36.0 (g/dL)   RDW 04.5  40.9 - 81.1 (%)   Platelets 308  150 - 400 (K/uL)  DIFFERENTIAL      Component Value Range   Neutrophils Relative 81 (*) 43 - 77 (%)   Neutro Abs 7.6  1.7 - 7.7 (K/uL)   Lymphocytes Relative 12  12 - 46 (%)   Lymphs Abs 1.1  0.7 - 4.0 (K/uL)   Monocytes Relative 7  3 - 12 (%)   Monocytes Absolute 0.7  0.1 - 1.0 (K/uL)   Eosinophils Relative 0  0 - 5 (%)   Eosinophils Absolute 0.0  0.0 - 0.7 (K/uL)   Basophils Relative 0  0 - 1 (%)   Basophils Absolute 0.0  0.0 - 0.1 (K/uL)  COMPREHENSIVE METABOLIC PANEL      Component Value  Range   Sodium 131 (*) 135 - 145 (mEq/L)   Potassium 3.6  3.5 - 5.1 (mEq/L)   Chloride 92 (*) 96 - 112 (mEq/L)   CO2 21  19 - 32 (mEq/L)   Glucose, Bld 326 (*) 70 - 99 (mg/dL)   BUN 7  6 - 23 (mg/dL)   Creatinine, Ser 9.14 (*) 0.50 - 1.35 (mg/dL)   Calcium 8.7  8.4 - 78.2 (mg/dL)   Total Protein 6.8  6.0 - 8.3 (g/dL)   Albumin 3.3 (*) 3.5 - 5.2 (g/dL)   AST 9  0 - 37 (U/L)  ALT 7  0 - 53 (U/L)   Alkaline Phosphatase 94  39 - 117 (U/L)   Total Bilirubin 0.3  0.3 - 1.2 (mg/dL)   GFR calc non Af Amer >90  >90 (mL/min)   GFR calc Af Amer >90  >90 (mL/min)  URINE RAPID DRUG SCREEN (HOSP PERFORMED)      Component Value Range   Opiates NONE DETECTED  NONE DETECTED    Cocaine NONE DETECTED  NONE DETECTED    Benzodiazepines NONE DETECTED  NONE DETECTED    Amphetamines NONE DETECTED  NONE DETECTED    Tetrahydrocannabinol POSITIVE (*) NONE DETECTED    Barbiturates NONE DETECTED  NONE DETECTED   ETHANOL      Component Value Range   Alcohol, Ethyl (B) <11  0 - 11 (mg/dL)  PROTIME-INR      Component Value Range   Prothrombin Time 13.9  11.6 - 15.2 (seconds)   INR 1.05  0.00 - 1.49   SALICYLATE LEVEL      Component Value Range   Salicylate Lvl <2.0 (*) 2.8 - 20.0 (mg/dL)  GLUCOSE, CAPILLARY      Component Value Range   Glucose-Capillary 301 (*) 70 - 99 (mg/dL)   Comment 1 Notify RN       1600:  Insulin SQ given for pt's elevated CBG with good response.  T/C to ACT to eval.  Pt does not know what his insulin sliding scale is, states he "just gives myself some when I eat."  Short and long acting insulin orders written as per pt's last admission's d/c instructions 02/25/2012.               Laray Anger, DO 04/27/12 1155

## 2012-04-25 NOTE — ED Notes (Signed)
Security wanded and searched pt belongings. One patient belonging bag.

## 2012-04-25 NOTE — ED Notes (Signed)
GUY:[id number]<VQ> Expected date:04/25/12<BR> Expected time:10:58 AM<BR> Means of arrival:Ambulance<BR> Comments:<BR> Overdose

## 2012-04-26 ENCOUNTER — Emergency Department (HOSPITAL_COMMUNITY): Payer: Medicare Other

## 2012-04-26 LAB — GLUCOSE, CAPILLARY: Glucose-Capillary: 273 mg/dL — ABNORMAL HIGH (ref 70–99)

## 2012-04-26 MED ORDER — ZIPRASIDONE MESYLATE 20 MG IM SOLR
INTRAMUSCULAR | Status: AC
  Filled 2012-04-26: qty 20

## 2012-04-26 MED ORDER — ZIPRASIDONE MESYLATE 20 MG IM SOLR
10.0000 mg | Freq: Once | INTRAMUSCULAR | Status: AC
  Administered 2012-04-26: 10 mg via INTRAMUSCULAR

## 2012-04-26 MED ORDER — GUAIFENESIN 100 MG/5ML PO SOLN
10.0000 mL | Freq: Once | ORAL | Status: AC
  Administered 2012-04-26: 200 mg via ORAL
  Filled 2012-04-26: qty 10

## 2012-04-26 NOTE — ED Notes (Signed)
Pt to xray

## 2012-04-26 NOTE — BH Assessment (Signed)
Assessment Note   [name] is a 26 y.o. male who presents to with SI/Depression. Pt overdosed on 15-80mg  Geodon tabs and 10-100mg  Trazodone tabs last night.  Pt called ems with c/o feeling lightheaded and faint.  Pt reports being SI for a few days and hearing voices with command to kill self, pt not actively psychotic.  Pt refused to talk with this counselor, when asked why pt attempted to harm self, pt replied--"I want to keep that to myself, I don't feel like talking about it".  Telepsych ordered and completed by Dr. Jacky Kindle.  Recommendation: INPT ADMISSION.     Axis I: Major Depression, Recurrent severe and Substance Abuse Axis II: Deferred Axis III:  Past Medical History  Diagnosis Date  . Diabetes mellitus   . Depression   . Schizo affective schizophrenia   . Polysubstance abuse   . Scoliosis   . Chronic pain    Axis IV: other psychosocial or environmental problems, problems related to social environment and problems with primary support group Axis V: 31-40 impairment in reality testing  Past Medical History:  Past Medical History  Diagnosis Date  . Diabetes mellitus   . Depression   . Schizo affective schizophrenia   . Polysubstance abuse   . Scoliosis   . Chronic pain     Past Surgical History  Procedure Date  . I&d extremity 12/19/2011    Procedure: IRRIGATION AND DEBRIDEMENT EXTREMITY;  Surgeon: Sharma Covert;  Location: [facility] OR;  Service: Orthopedics;  Laterality: Left;    Family History: No family history on file.  Social History:  reports that he has been smoking.  He does not have any smokeless tobacco history on file. He reports that he uses illicit drugs (Marijuana). He reports that he does not drink alcohol.  Additional Social History:  Alcohol / Drug Use Pain Medications: None  Prescriptions: None  Over the Counter: None  History of alcohol / drug use?: Yes Longest period of sobriety (when/how long): None  Substance #1 Name of Substance 1: THC  1  - Age of First Use: Unk  1 - Amount (size/oz): Unk  1 - Frequency: Unk  1 - Duration: Unk  1 - Last Use / Amount: Unk  Allergies:  Allergies  Allergen Reactions  . Percocet (Oxycodone-Acetaminophen) Swelling  . Sulfa Antibiotics Other (See Comments)    Unknown - Childhood  . Sulfa Antibiotics Other (See Comments)    unknown    Home Medications:  (Not in a hospital admission)  OB/GYN Status:  No LMP for male patient.  General Assessment Data Location of Assessment: [facility] ED Living Arrangements: Alone Can pt return to current living arrangement?: Yes Admission Status: Voluntary Is patient capable of signing voluntary admission?: Yes Transfer from: Acute Hospital  Education Status Is patient currently in school?: No Current Grade: None  Highest grade of school patient has completed: Unk  Name of school: Unk  Contact person: None   Risk to self Suicidal Ideation: Yes-Currently Present Suicidal Intent: Yes-Currently Present Is patient at risk for suicide?: Yes Suicidal Plan?: Yes-Currently Present Specify Current Suicidal Plan: Pt overdosed on 15-80mg  Geodon and 10-100mg  Trazodone  Access to Means: Yes Specify Access to Suicidal Means: Medications  What has been your use of drugs/alcohol within the last 12 months?: Pt abusing THC Previous Attempts/Gestures: Yes How many times?: 2  Other Self Harm Risks: None  Triggers for Past Attempts: Unpredictable Intentional Self Injurious Behavior: None Family Suicide History: No Recent stressful life event(s): Other (  Comment) (Pt did not disclose infomation ) Persecutory voices/beliefs?: No Depression: Yes Depression Symptoms: Loss of interest in usual pleasures;Feeling worthless/self pity;Feeling angry/irritable Substance abuse history and/or treatment for substance abuse?: Yes Suicide prevention information given to non-admitted patients: Not applicable  Risk to Others Homicidal Ideation: No Thoughts of Harm to Others:  No Current Homicidal Intent: No Current Homicidal Plan: No Access to Homicidal Means: No Identified Victim: None  Assessment of Violence: None Noted Violent Behavior Description: None  Does patient have access to weapons?: No Criminal Charges Pending?: No Does patient have a court date: No  Psychosis Hallucinations: Auditory;With command (Command to kill self ) Delusions: None noted  Mental Status Report Appear/Hygiene: Body odor;Poor hygiene;Disheveled Eye Contact: Poor Motor Activity: Unremarkable Speech: Logical/coherent;Slow Level of Consciousness: Drowsy;Irritable Mood: Depressed;Anhedonia;Irritable;Sad Affect: Blunted Anxiety Level: None Thought Processes: Relevant Judgement: Impaired Orientation: Person;Place;Time;Situation Obsessive Compulsive Thoughts/Behaviors: None  Cognitive Functioning Concentration: Normal Memory: Recent Intact;Remote Intact IQ: Average Insight: Poor Impulse Control: Poor Appetite: Poor Weight Loss: 0  Weight Gain: 0  Sleep: No Change Total Hours of Sleep:  (Pt did not disclose ) Vegetative Symptoms: None  Prior Inpatient Therapy Prior Inpatient Therapy: Yes Prior Therapy Dates: Unk  Prior Therapy Facilty/Provider(s): [facility], [facility], [place], [place]  Reason for Treatment: SI/Psychosis   Prior Outpatient Therapy Prior Outpatient Therapy: Yes Prior Therapy Dates: 2013 Prior Therapy Facilty/Provider(s): [facility]  Reason for Treatment: Schizoaffective D/O   ADL Screening (condition at time of admission) Patient's cognitive ability adequate to safely complete daily activities?: Yes Patient able to express need for assistance with ADLs?: Yes Independently performs ADLs?: Yes Weakness of Legs: None Weakness of Arms/Hands: None       Abuse/Neglect Assessment (Assessment to be complete while patient is alone) Physical Abuse: Denies Verbal Abuse: Denies Sexual Abuse: Denies Exploitation of patient/patient's resources:  Denies Self-Neglect: Denies Values / Beliefs Cultural Requests During Hospitalization: None Spiritual Requests During Hospitalization: None Consults Spiritual Care Consult Needed: No Social Work Consult Needed: No Merchant navy officer (For Healthcare) Advance Directive: Patient does not have advance directive;Patient would not like information Pre-existing out of facility DNR order (yellow form or pink MOST form): No    Additional Information 1:1 In Past 12 Months?: No CIRT Risk: No Elopement Risk: No Does patient have medical clearance?: Yes     Disposition:  Disposition Disposition of Patient: Referred to ([facility] ) Other disposition(s): Other (Comment) (Telepsych) Patient referred to: Other (Comment) (Telepsych )  On Site Evaluation by:   Reviewed with Physician:     Murrell Redden 04/26/2012 12:43 AM

## 2012-04-26 NOTE — ED Notes (Signed)
Pt reports feeling more anxious. rn explained that pt needed to give the ativan a little time to work. rn advised pt to turn the lights off and work on taking deep breathes and relaxing. rn explained the process of placement and that ACT team was working on getting placement set up for pt.

## 2012-04-26 NOTE — ED Notes (Signed)
Pt calm and collected. Pt taken out of right handcuff. Pt meets criteria to be released from restraints. Pt vitals being taken and pt will be provided with snack.

## 2012-04-26 NOTE — Discharge Planning (Addendum)
Patient has been accepted to [facility] by Dr. Wendall Stade. Nurse to call report to [phone].  Patient has been IVCed and will be transported by [facility] to [facility] [facility] [place] building.  Pt's nurse notified.  [name] , MSW, LCSWA 04/26/2012 2:29 PM [phone]

## 2012-04-26 NOTE — ED Notes (Addendum)
Pt was placed in hand cuffs due to the fact that pt was getting verbally aggressive and staring at GDP's gun intently. Pt reports that he wants to escape and says that if he is cuffed then he has to keep his hands to himself. Pt said that the black restraints would be a fun challenge to get out of. Pt extremely riled up and anxious.

## 2012-04-26 NOTE — ED Notes (Signed)
Kathie Rhodes, rn received report.

## 2012-04-26 NOTE — ED Notes (Signed)
Sheriffs department called and will arrive shortly to transport pt.

## 2012-04-26 NOTE — ED Notes (Signed)
Pt being verbally aggressive and stating he has to leave this place. Pt educated that he is IVC and cannot leave. Pt is requesting to be arrested instead. Security and GDP in room. md alerted

## 2012-04-26 NOTE — Discharge Planning (Signed)
CSW verified with Broadus John at [facility] that patient's information was received.  Patient has been pre approved for admission by Dr. Wendall Stade, however there are no beds available.  Will contact us if bed becomes available. Patient is also being reviewed at [facility].  Marlaine Hind ANN S , MSW, LCSWA 04/26/2012 12:26 PM [phone]

## 2012-05-10 ENCOUNTER — Encounter (HOSPITAL_COMMUNITY): Payer: Self-pay | Admitting: *Deleted

## 2012-05-10 ENCOUNTER — Emergency Department (HOSPITAL_COMMUNITY)
Admission: EM | Admit: 2012-05-10 | Discharge: 2012-05-11 | Disposition: A | Discharge status: Other Acute Inpatient Hospital | Payer: Medicare Other | Attending: Emergency Medicine | Admitting: Emergency Medicine

## 2012-05-10 DIAGNOSIS — K0889 Other specified disorders of teeth and supporting structures: Secondary | ICD-10-CM

## 2012-05-10 DIAGNOSIS — H53149 Visual discomfort, unspecified: Secondary | ICD-10-CM | POA: Insufficient documentation

## 2012-05-10 DIAGNOSIS — F203 Undifferentiated schizophrenia: Secondary | ICD-10-CM

## 2012-05-10 DIAGNOSIS — R739 Hyperglycemia, unspecified: Secondary | ICD-10-CM

## 2012-05-10 DIAGNOSIS — E119 Type 2 diabetes mellitus without complications: Secondary | ICD-10-CM | POA: Insufficient documentation

## 2012-05-10 DIAGNOSIS — F209 Schizophrenia, unspecified: Secondary | ICD-10-CM | POA: Insufficient documentation

## 2012-05-10 DIAGNOSIS — F3289 Other specified depressive episodes: Secondary | ICD-10-CM | POA: Insufficient documentation

## 2012-05-10 DIAGNOSIS — M4120 Other idiopathic scoliosis, site unspecified: Secondary | ICD-10-CM | POA: Insufficient documentation

## 2012-05-10 DIAGNOSIS — R45851 Suicidal ideations: Secondary | ICD-10-CM | POA: Insufficient documentation

## 2012-05-10 DIAGNOSIS — K089 Disorder of teeth and supporting structures, unspecified: Secondary | ICD-10-CM | POA: Insufficient documentation

## 2012-05-10 DIAGNOSIS — Z794 Long term (current) use of insulin: Secondary | ICD-10-CM | POA: Insufficient documentation

## 2012-05-10 DIAGNOSIS — F329 Major depressive disorder, single episode, unspecified: Secondary | ICD-10-CM | POA: Insufficient documentation

## 2012-05-10 DIAGNOSIS — H539 Unspecified visual disturbance: Secondary | ICD-10-CM | POA: Insufficient documentation

## 2012-05-10 DIAGNOSIS — F17200 Nicotine dependence, unspecified, uncomplicated: Secondary | ICD-10-CM | POA: Insufficient documentation

## 2012-05-10 LAB — URINE MICROSCOPIC-ADD ON

## 2012-05-10 LAB — URINALYSIS, ROUTINE W REFLEX MICROSCOPIC
Bilirubin Urine: NEGATIVE
Glucose, UA: >1000 mg/dL — AB
Ketones, ur: NEGATIVE mg/dL
Leukocytes, UA: NEGATIVE
Nitrite: NEGATIVE
Specific Gravity, Urine: 1.033 — ABNORMAL HIGH (ref 1.005–1.030)
pH: 7 (ref 5.0–8.0)

## 2012-05-10 LAB — CBC
HCT: 39.3 % (ref 39.0–52.0)
Hemoglobin: 13.8 g/dL (ref 13.0–17.0)
MCV: 86.4 fL (ref 78.0–100.0)
RBC: 4.55 MIL/uL (ref 4.22–5.81)
WBC: 9.2 10*3/uL (ref 4.0–10.5)

## 2012-05-10 LAB — BASIC METABOLIC PANEL
BUN: 13 mg/dL (ref 6–23)
CO2: 30 mEq/L (ref 19–32)
Chloride: 92 mEq/L — ABNORMAL LOW (ref 96–112)
Creatinine, Ser: 0.48 mg/dL — ABNORMAL LOW (ref 0.50–1.35)
GFR calc Af Amer: >90 mL/min (ref >90)
Potassium: 3.7 mEq/L (ref 3.5–5.1)

## 2012-05-10 LAB — RAPID URINE DRUG SCREEN, HOSP PERFORMED
Amphetamines: NOT DETECTED
Barbiturates: NOT DETECTED
Benzodiazepines: NOT DETECTED
Cocaine: NOT DETECTED

## 2012-05-10 LAB — GLUCOSE, CAPILLARY

## 2012-05-10 MED ORDER — INSULIN ASPART 100 UNIT/ML ~~LOC~~ SOLN: 5.0000 [IU] | Freq: Once | SUBCUTANEOUS | Status: DC

## 2012-05-10 MED ORDER — LORAZEPAM 1 MG PO TABS
1.0000 mg | ORAL_TABLET | Freq: Three times a day (TID) | ORAL | Status: DC | PRN
  Administered 2012-05-10 – 2012-05-11 (×2): 1 mg via ORAL
  Filled 2012-05-10 (×2): qty 1

## 2012-05-10 MED ORDER — INSULIN ASPART 100 UNIT/ML ~~LOC~~ SOLN
10.0000 [IU] | Freq: Once | SUBCUTANEOUS | Status: AC
  Administered 2012-05-10: 10 [IU] via SUBCUTANEOUS
  Filled 2012-05-10: qty 10

## 2012-05-10 MED ORDER — HYDROCODONE-ACETAMINOPHEN 5-325 MG PO TABS
1.0000 | ORAL_TABLET | Freq: Four times a day (QID) | ORAL | Status: DC | PRN
  Administered 2012-05-10 – 2012-05-11 (×3): 1 via ORAL
  Filled 2012-05-10 (×4): qty 1

## 2012-05-10 MED ORDER — IBUPROFEN 200 MG PO TABS
600.0000 mg | ORAL_TABLET | Freq: Three times a day (TID) | ORAL | Status: DC | PRN
  Administered 2012-05-10 – 2012-05-11 (×2): 600 mg via ORAL
  Filled 2012-05-10 (×3): qty 3

## 2012-05-10 MED ORDER — INSULIN ASPART 100 UNIT/ML ~~LOC~~ SOLN
5.0000 [IU] | Freq: Once | SUBCUTANEOUS | Status: AC
  Administered 2012-05-10: 5 [IU] via SUBCUTANEOUS
  Filled 2012-05-10: qty 5

## 2012-05-10 MED ORDER — PENICILLIN V POTASSIUM 250 MG PO TABS
500.0000 mg | ORAL_TABLET | Freq: Four times a day (QID) | ORAL | Status: DC
  Administered 2012-05-10 – 2012-05-11 (×5): 500 mg via ORAL
  Filled 2012-05-10 (×5): qty 2

## 2012-05-10 MED ORDER — HYDROCODONE-ACETAMINOPHEN 5-325 MG PO TABS
1.0000 | ORAL_TABLET | Freq: Once | ORAL | Status: AC
  Administered 2012-05-10: 1 via ORAL

## 2012-05-10 MED ORDER — ONDANSETRON HCL 8 MG PO TABS: 4.0000 mg | ORAL_TABLET | Freq: Three times a day (TID) | ORAL | Status: DC | PRN

## 2012-05-10 NOTE — ED Notes (Signed)
Patient argumentative and angry, demanding more pain medication. Informed patient that his pain medication wasn't due until 9 pm. Patient stated that he "has the right to have his f###ing pain managed" and told the RN "f### you" Security and sitter remains at bedside.
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
## 2012-05-10 NOTE — ED Notes (Signed)
Personal belongings in ED locker #9

## 2012-05-10 NOTE — ED Provider Notes (Addendum)
History   This chart was scribed for Shelda Jakes, MD by Shari Heritage. The patient was seen in room MCY30/MCY30. Patient's care was started at 1246.      CSN: [id number]  Arrival date & time 05/10/12  1246   First MD Initiated Contact with Patient 05/10/12 1318      Chief Complaint  Patient presents with  . Suicidal    (Consider location/radiation/quality/duration/timing/severity/associated sxs/prior treatment) The history is provided by the patient. No language interpreter was used.   [name] is a 26 y.o. male who presents to the Emergency Department complaining of suicidal ideation associated with paranoia, loss of sleep, photophobia and  visual disturbance. Patient also reports wisdom tooth pain on the top right of his mouth. Patient was an inpatient at [facility]. He was released 2 days ago. Patient was taking Zyprexa, but the says that the medication is very expensive and has been unable to fill his current rx's. Patient with h/o diabetes, depression, schizo affect disorder schizophrenia, and polysubstance abuse. Patient is a current smoker.  Past Medical History  Diagnosis Date  . Diabetes mellitus   . Depression   . Schizo affective schizophrenia   . Polysubstance abuse   . Scoliosis   . Chronic pain     Past Surgical History  Procedure Date  . I&d extremity 12/19/2011    Procedure: IRRIGATION AND DEBRIDEMENT EXTREMITY;  Surgeon: Sharma Covert;  Location: [facility] OR;  Service: Orthopedics;  Laterality: Left;    History reviewed. No pertinent family history.  History  Substance Use Topics  . Smoking status: Current Some Day Smoker -- 0.5 packs/day  . Smokeless tobacco: Not on file  . Alcohol Use: No      Review of Systems  Constitutional: Negative for fever and chills.  Eyes: Positive for photophobia and visual disturbance.  Respiratory: Negative for shortness of breath.   Gastrointestinal: Negative for nausea and vomiting.  Neurological: Negative  for weakness.  Psychiatric/Behavioral: Positive for suicidal ideas.  Positive for loss of sleep and paranoia.  Allergies  Percocet; Sulfa antibiotics; and Sulfa antibiotics  Home Medications   Current Outpatient Rx  Name Route Sig Dispense Refill  . INSULIN ASPART 100 UNIT/ML [place] SOLN Subcutaneous Inject 10-18 Units into the skin 3 (three) times daily before meals. Patient uses sliding scale. 1 vial 3  . INSULIN GLARGINE 100 UNIT/ML [place] SOLN Subcutaneous Inject 30 Units into the skin at bedtime. 10 mL 3    BP 126/89  Pulse 125  Temp(Src) 98.1 F (36.7 C) (Oral)  Resp 16  SpO2 99%  Physical Exam  Constitutional: He is oriented to person, place, and time.  HENT:       Poor dentition in back molars. No gum swelling.  Eyes:       Pupils are sluggish and do not fully constrict.  Cardiovascular: Normal rate, regular rhythm and normal heart sounds.   No murmur heard. Pulmonary/Chest: Effort normal and breath sounds normal. He has no wheezes. He has no rales.  Abdominal: Bowel sounds are normal. There is no tenderness.  Musculoskeletal: Normal range of motion. He exhibits no edema.  Neurological: He is alert and oriented to person, place, and time.    ED Course  Procedures (including critical care time) DIAGNOSTIC STUDIES: Oxygen Saturation is 99% on room air, normal by my interpretation.    COORDINATION OF CARE: 2:20PM- Patient informed of current plan for treatment and evaluation and agrees with plan at this time.    Labs  Reviewed  BASIC METABOLIC PANEL - Abnormal; Notable for the following:    Chloride 92 (*)    Glucose, Bld 493 (*)    Creatinine, Ser 0.48 (*)    All other components within normal limits  CBC  URINALYSIS, ROUTINE W REFLEX MICROSCOPIC  URINE RAPID DRUG SCREEN (HOSP PERFORMED)   No results found. Results for orders placed during the hospital encounter of 05/10/12  CBC      Component Value Range   WBC 9.2  4.0 - 10.5 (K/uL)   RBC 4.55  4.22 - 5.81  (MIL/uL)   Hemoglobin 13.8  13.0 - 17.0 (g/dL)   HCT 16.1  09.6 - 04.5 (%)   MCV 86.4  78.0 - 100.0 (fL)   MCH 30.3  26.0 - 34.0 (pg)   MCHC 35.1  30.0 - 36.0 (g/dL)   RDW 40.9  81.1 - 91.4 (%)   Platelets 377  150 - 400 (K/uL)  BASIC METABOLIC PANEL      Component Value Range   Sodium 135  135 - 145 (mEq/L)   Potassium 3.7  3.5 - 5.1 (mEq/L)   Chloride 92 (*) 96 - 112 (mEq/L)   CO2 30  19 - 32 (mEq/L)   Glucose, Bld 493 (*) 70 - 99 (mg/dL)   BUN 13  6 - 23 (mg/dL)   Creatinine, Ser 7.82 (*) 0.50 - 1.35 (mg/dL)   Calcium 9.5  8.4 - 95.6 (mg/dL)   GFR calc non Af Amer >90  >90 (mL/min)   GFR calc Af Amer >90  >90 (mL/min)   Results for orders placed during the hospital encounter of 05/10/12  CBC      Component Value Range   WBC 9.2  4.0 - 10.5 (K/uL)   RBC 4.55  4.22 - 5.81 (MIL/uL)   Hemoglobin 13.8  13.0 - 17.0 (g/dL)   HCT 21.3  08.6 - 57.8 (%)   MCV 86.4  78.0 - 100.0 (fL)   MCH 30.3  26.0 - 34.0 (pg)   MCHC 35.1  30.0 - 36.0 (g/dL)   RDW 46.9  62.9 - 52.8 (%)   Platelets 377  150 - 400 (K/uL)  BASIC METABOLIC PANEL      Component Value Range   Sodium 135  135 - 145 (mEq/L)   Potassium 3.7  3.5 - 5.1 (mEq/L)   Chloride 92 (*) 96 - 112 (mEq/L)   CO2 30  19 - 32 (mEq/L)   Glucose, Bld 493 (*) 70 - 99 (mg/dL)   BUN 13  6 - 23 (mg/dL)   Creatinine, Ser 4.13 (*) 0.50 - 1.35 (mg/dL)   Calcium 9.5  8.4 - 24.4 (mg/dL)   GFR calc non Af Amer >90  >90 (mL/min)   GFR calc Af Amer >90  >90 (mL/min)  URINALYSIS, ROUTINE W REFLEX MICROSCOPIC      Component Value Range   Color, Urine YELLOW  YELLOW    APPearance CLEAR  CLEAR    Specific Gravity, Urine 1.033 (*) 1.005 - 1.030    pH 7.0  5.0 - 8.0    Glucose, UA >1000 (*) NEGATIVE (mg/dL)   Hgb urine dipstick NEGATIVE  NEGATIVE    Bilirubin Urine NEGATIVE  NEGATIVE    Ketones, ur NEGATIVE  NEGATIVE (mg/dL)   Protein, ur NEGATIVE  NEGATIVE (mg/dL)   Urobilinogen, UA 0.2  0.0 - 1.0 (mg/dL)   Nitrite NEGATIVE  NEGATIVE     Leukocytes, UA NEGATIVE  NEGATIVE   URINE RAPID DRUG SCREEN (  HOSP PERFORMED)      Component Value Range   Opiates NONE DETECTED  NONE DETECTED    Cocaine NONE DETECTED  NONE DETECTED    Benzodiazepines NONE DETECTED  NONE DETECTED    Amphetamines NONE DETECTED  NONE DETECTED    Tetrahydrocannabinol NONE DETECTED  NONE DETECTED    Barbiturates NONE DETECTED  NONE DETECTED   URINE MICROSCOPIC-ADD ON      Component Value Range   Squamous Epithelial / LPF RARE  RARE    WBC, UA 0-2  <3 (WBC/hpf)  GLUCOSE, CAPILLARY      Component Value Range   Glucose-Capillary 331 (*) 70 - 99 (mg/dL)     1. Schizophrenia, undifferentiated   2. Suicidal ideation   3. Hyperglycemia   4. Toothache       MDM   Patient just discharged from [facility] 2 days ago his back with complaints of paranoia and fleeting suicidal thoughts. Patient's glucose is significantly elevated and require treatment [facility] can see the patient patient also with complaint of right upper wisdom tooth pain has had tooth pain in the past we'll treat with pain medicine and antibiotics. For the blood sugar will need to treat with insulin and did get that under control. We'll give 10 units of regular insulin subcutaneous. Will followup blood sugars.    The patient has been accepted by [facility] if the blood sugar can get down to around 200. Most recent fingerstick asthma 295 patient's already received 10 units of regular insulin subcutaneous when he first [facility]. Will going give an additional 5 units of regular insulin subcutaneous. Recheck of blood sugar in an hour. Patient been evaluated by behavioral health team and as stated above except by [facility]. [facility]    I personally performed the services described in this documentation, which was scribed in my presence. The recorded information has been reviewed and considered.     Shelda Jakes, MD 05/10/12  1441  Shelda Jakes, MD 05/10/12 [phone]

## 2012-05-10 NOTE — ED Notes (Signed)
[facility] notified that patient's blood sugar is under 200 and can be processed for transport to outside facility

## 2012-05-10 NOTE — ED Notes (Signed)
Accidentally discharged patient. Patient remains under our care. dispo pending.

## 2012-05-10 NOTE — ED Notes (Signed)
Patient states recently tried to commit SI by overdosing on trazodone and geodon.  Seen in ED here and sent to [facility].  Was there for 2 weeks and released 2 days ago. States taking medication at that facility that helped.  However since discharge patient tried to get medication filled cannot because the cost is $70 and cannot afford it.  Patient states feels paranoia sits on couch watch locks and thinks they are going to open and a bag of chips are going to talk to him.  States SI and denies HI.  Patient entered stretcher triage with blue scrubs on and wanded by security in triage.

## 2012-05-10 NOTE — ED Notes (Signed)
Pt here for placement at mental health facility, pt with hx of schizo-effective disorder. Pt states he is unable to fill rx's for medications for psych. Pt reports paranoia, thoughts of suicide, and depression. Pt has been taking his insulin.

## 2012-05-10 NOTE — ED Notes (Signed)
Sitter informed this RN that patient has locked himself in the bathroom. Pt states "that I am taking a shower and I needed privacy" Pt states "I am not involuntary I can do what I want" This RN explained to pt that he must keep door open for safety purposes. Pt came out of bathroom. MD made aware. IVC papers iniated by [facility] team

## 2012-05-11 LAB — GLUCOSE, CAPILLARY
Glucose-Capillary: 503 mg/dL — ABNORMAL HIGH (ref 70–99)
Glucose-Capillary: 350 mg/dL — ABNORMAL HIGH (ref 70–99)

## 2012-05-11 MED ORDER — INSULIN ASPART 100 UNIT/ML ~~LOC~~ SOLN
10.0000 [IU] | Freq: Three times a day (TID) | SUBCUTANEOUS | Status: DC
  Administered 2012-05-11: 15 [IU] via SUBCUTANEOUS
  Filled 2012-05-11: qty 1

## 2012-05-11 MED ORDER — BENZOCAINE 10 % MT GEL
Freq: Three times a day (TID) | OROMUCOSAL | Status: DC | PRN
  Filled 2012-05-11: qty 9.4

## 2012-05-11 MED ORDER — INSULIN GLARGINE 100 UNIT/ML ~~LOC~~ SOLN
30.0000 [IU] | Freq: Every day | SUBCUTANEOUS | Status: DC
  Administered 2012-05-11: 30 [IU] via SUBCUTANEOUS

## 2012-05-11 NOTE — ED Notes (Signed)
PER [facility], PT'S GLUCOSE NEEDED TO DROP BELOW 300. LAST CBG 173. CBG LAB RESULTS FAXED TO [facility] AS REQUESTED.
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
## 2012-05-11 NOTE — ED Notes (Signed)
Sheriff at the bedside to transport to facility. IVC paperwork, transfer paperwork, ED notes, and belongings given to officer.

## 2012-05-11 NOTE — ED Provider Notes (Signed)
Pt was accepted at [facility].  Cheri Guppy, MD 05/11/12 [phone]

## 2012-05-11 NOTE — ED Notes (Signed)
CBG 224 

## 2012-05-11 NOTE — ED Notes (Signed)
Pt frustrated, security at the bedside. States he wants to call the sheriff himself. Pt instructed to stay in room.

## 2012-05-11 NOTE — ED Notes (Signed)
Pt resting quietly at the time. CBG 350. Will continue to monitor. No signs of distress noted. Sitter at bedside.

## 2012-05-11 NOTE — ED Notes (Signed)
Report called to [place], Charity fundraiser at [employer]. Pt to be transported by sheriff to facility. Pt updated on plan of care. Had no questions at the time.

## 2012-05-11 NOTE — ED Notes (Signed)
Patient ok to be d/c to [facility]. [facility] notified for transport. Report called to [facility]

## 2012-06-17 ENCOUNTER — Inpatient Hospital Stay (HOSPITAL_COMMUNITY)
Admission: EM | Admit: 2012-06-17 | Discharge: 2012-06-18 | DRG: 638 | Disposition: A | Discharge status: Home / Self Care | Payer: Medicare Other | Attending: Internal Medicine | Admitting: Internal Medicine

## 2012-06-17 ENCOUNTER — Inpatient Hospital Stay (HOSPITAL_COMMUNITY)
Admission: EM | Admit: 2012-06-17 | Discharge: 2012-06-17 | Disposition: A | Discharge status: Home / Self Care | Payer: Medicare Other | Source: Home / Self Care | Attending: Internal Medicine | Admitting: Internal Medicine

## 2012-06-17 ENCOUNTER — Encounter (HOSPITAL_COMMUNITY): Payer: Self-pay | Admitting: Family Medicine

## 2012-06-17 ENCOUNTER — Emergency Department (HOSPITAL_COMMUNITY): Payer: Medicare Other

## 2012-06-17 ENCOUNTER — Encounter (HOSPITAL_COMMUNITY): Payer: Self-pay | Admitting: Emergency Medicine

## 2012-06-17 DIAGNOSIS — E1110 Type 2 diabetes mellitus with ketoacidosis without coma: Secondary | ICD-10-CM

## 2012-06-17 DIAGNOSIS — E860 Dehydration: Secondary | ICD-10-CM | POA: Diagnosis present

## 2012-06-17 DIAGNOSIS — E1010 Type 1 diabetes mellitus with ketoacidosis without coma: Principal | ICD-10-CM | POA: Diagnosis present

## 2012-06-17 DIAGNOSIS — F203 Undifferentiated schizophrenia: Secondary | ICD-10-CM | POA: Diagnosis present

## 2012-06-17 DIAGNOSIS — F411 Generalized anxiety disorder: Secondary | ICD-10-CM | POA: Diagnosis present

## 2012-06-17 DIAGNOSIS — E871 Hypo-osmolality and hyponatremia: Secondary | ICD-10-CM | POA: Diagnosis present

## 2012-06-17 DIAGNOSIS — F209 Schizophrenia, unspecified: Secondary | ICD-10-CM | POA: Diagnosis present

## 2012-06-17 DIAGNOSIS — E1065 Type 1 diabetes mellitus with hyperglycemia: Secondary | ICD-10-CM

## 2012-06-17 HISTORY — DX: Patient's other noncompliance with medication regimen: Z91.14

## 2012-06-17 HISTORY — DX: Patient's other noncompliance with medication regimen for other reason: Z91.148

## 2012-06-17 LAB — POCT I-STAT, CHEM 8
BUN: 7 mg/dL (ref 6–23)
Chloride: 100 mEq/L (ref 96–112)
Chloride: 104 mEq/L (ref 96–112)
Creatinine, Ser: 0.7 mg/dL (ref 0.50–1.35)
Glucose, Bld: 411 mg/dL — ABNORMAL HIGH (ref 70–99)
Glucose, Bld: 310 mg/dL — ABNORMAL HIGH (ref 70–99)
HCT: 59 % — ABNORMAL HIGH (ref 39.0–52.0)
HCT: 55 % — ABNORMAL HIGH (ref 39.0–52.0)
Hemoglobin: 20.1 g/dL — ABNORMAL HIGH (ref 13.0–17.0)
Potassium: 4.4 mEq/L (ref 3.5–5.1)
Potassium: 4.5 mEq/L (ref 3.5–5.1)

## 2012-06-17 LAB — CBC
HCT: 44.2 % (ref 39.0–52.0)
HCT: 51.1 % (ref 39.0–52.0)
Hemoglobin: 15.9 g/dL (ref 13.0–17.0)
Hemoglobin: 18.2 g/dL — ABNORMAL HIGH (ref 13.0–17.0)
Hemoglobin: 18.4 g/dL — ABNORMAL HIGH (ref 13.0–17.0)
MCH: 30.5 pg (ref 26.0–34.0)
MCH: 31 pg (ref 26.0–34.0)
MCHC: 37 g/dL — ABNORMAL HIGH (ref 30.0–36.0)
MCV: 84.6 fL (ref 78.0–100.0)
Platelets: 347 10*3/uL (ref 150–400)
RBC: 5.22 MIL/uL (ref 4.22–5.81)
RBC: 5.87 MIL/uL — ABNORMAL HIGH (ref 4.22–5.81)
RBC: 6.04 MIL/uL — ABNORMAL HIGH (ref 4.22–5.81)
WBC: 13.8 10*3/uL — ABNORMAL HIGH (ref 4.0–10.5)

## 2012-06-17 LAB — BLOOD GAS, ARTERIAL
Bicarbonate: 9.4 mEq/L — ABNORMAL LOW (ref 20.0–24.0)
Patient temperature: 98.6
TCO2: 8.2 mmol/L (ref 0–100)
pCO2 arterial: 22.2 mmHg — ABNORMAL LOW (ref 35.0–45.0)
pH, Arterial: 7.25 — ABNORMAL LOW (ref 7.350–7.450)
pO2, Arterial: 142 mmHg — ABNORMAL HIGH (ref 80.0–100.0)

## 2012-06-17 LAB — BASIC METABOLIC PANEL
BUN: 10 mg/dL (ref 6–23)
Chloride: 87 mEq/L — ABNORMAL LOW (ref 96–112)
Creatinine, Ser: 0.58 mg/dL (ref 0.50–1.35)
GFR calc Af Amer: >90 mL/min (ref >90)
GFR calc non Af Amer: >90 mL/min (ref >90)

## 2012-06-17 LAB — GLUCOSE, CAPILLARY
Glucose-Capillary: 432 mg/dL — ABNORMAL HIGH (ref 70–99)
Glucose-Capillary: 387 mg/dL — ABNORMAL HIGH (ref 70–99)
Glucose-Capillary: 377 mg/dL — ABNORMAL HIGH (ref 70–99)
Glucose-Capillary: 296 mg/dL — ABNORMAL HIGH (ref 70–99)
Glucose-Capillary: 224 mg/dL — ABNORMAL HIGH (ref 70–99)

## 2012-06-17 LAB — RAPID URINE DRUG SCREEN, HOSP PERFORMED
Amphetamines: NOT DETECTED
Benzodiazepines: NOT DETECTED
Tetrahydrocannabinol: NOT DETECTED

## 2012-06-17 MED ORDER — DEXTROSE-NACL 5-0.45 % IV SOLN: INTRAVENOUS | Status: DC

## 2012-06-17 MED ORDER — POTASSIUM CHLORIDE 10 MEQ/100ML IV SOLN: 10.0000 meq | INTRAVENOUS | Status: DC

## 2012-06-17 MED ORDER — PANTOPRAZOLE SODIUM 40 MG IV SOLR
40.0000 mg | Freq: Two times a day (BID) | INTRAVENOUS | Status: DC
Start: 2012-06-17 — End: 2012-06-18
  Administered 2012-06-17 – 2012-06-18 (×2): 40 mg via INTRAVENOUS
  Filled 2012-06-17 (×3): qty 40

## 2012-06-17 MED ORDER — SODIUM CHLORIDE 0.9 % IV BOLUS (SEPSIS)
2000.0000 mL | Freq: Once | INTRAVENOUS | Status: AC
  Administered 2012-06-17: 2000 mL via INTRAVENOUS

## 2012-06-17 MED ORDER — DEXTROSE 50 % IV SOLN
25.0000 mL | INTRAVENOUS | Status: DC | PRN
  Filled 2012-06-17: qty 50

## 2012-06-17 MED ORDER — ONDANSETRON HCL 4 MG/2ML IJ SOLN: 4.0000 mg | Freq: Three times a day (TID) | INTRAMUSCULAR | Status: AC | PRN

## 2012-06-17 MED ORDER — GI COCKTAIL ~~LOC~~
30.0000 mL | Freq: Once | ORAL | Status: AC
  Administered 2012-06-17: 30 mL via ORAL
  Filled 2012-06-17: qty 30

## 2012-06-17 MED ORDER — POTASSIUM CHLORIDE 10 MEQ/100ML IV SOLN
10.0000 meq | INTRAVENOUS | Status: AC
  Administered 2012-06-17: 10 meq via INTRAVENOUS
  Filled 2012-06-17: qty 100

## 2012-06-17 MED ORDER — SODIUM CHLORIDE 0.9 % IV SOLN
1000.0000 mL | Freq: Once | INTRAVENOUS | Status: AC
  Administered 2012-06-17: 1000 mL via INTRAVENOUS

## 2012-06-17 MED ORDER — SODIUM CHLORIDE 0.9 % IV SOLN: INTRAVENOUS | Status: AC

## 2012-06-17 MED ORDER — HYDROMORPHONE HCL PF 1 MG/ML IJ SOLN: 1.0000 mg | INTRAMUSCULAR | Status: DC | PRN

## 2012-06-17 MED ORDER — SODIUM CHLORIDE 0.9 % IV SOLN
INTRAVENOUS | Status: DC
  Administered 2012-06-17: 22:00:00 via INTRAVENOUS

## 2012-06-17 MED ORDER — DEXTROSE 50 % IV SOLN: 25.0000 mL | INTRAVENOUS | Status: DC | PRN

## 2012-06-17 MED ORDER — SODIUM CHLORIDE 0.9 % IV SOLN: INTRAVENOUS | Status: DC

## 2012-06-17 MED ORDER — SODIUM CHLORIDE 0.9 % IV SOLN
INTRAVENOUS | Status: AC
  Administered 2012-06-17: 22:00:00 via INTRAVENOUS

## 2012-06-17 MED ORDER — INSULIN REGULAR HUMAN 100 UNIT/ML IJ SOLN
INTRAMUSCULAR | Status: DC
  Administered 2012-06-18: 0.5 [IU]/h via INTRAVENOUS
  Filled 2012-06-17: qty 1

## 2012-06-17 MED ORDER — ENOXAPARIN SODIUM 40 MG/0.4ML ~~LOC~~ SOLN
40.0000 mg | Freq: Every day | SUBCUTANEOUS | Status: DC
  Administered 2012-06-18: 40 mg via SUBCUTANEOUS
  Filled 2012-06-17 (×2): qty 0.4

## 2012-06-17 MED ORDER — SODIUM CHLORIDE 0.9 % IV SOLN
INTRAVENOUS | Status: DC
  Administered 2012-06-17: 2.4 [IU]/h via INTRAVENOUS
  Filled 2012-06-17: qty 1

## 2012-06-17 MED ORDER — SODIUM CHLORIDE 0.9 % IV SOLN
INTRAVENOUS | Status: DC
  Administered 2012-06-17: 12:00:00 via INTRAVENOUS

## 2012-06-17 MED ORDER — SODIUM CHLORIDE 0.9 % IV SOLN
1000.0000 mL | INTRAVENOUS | Status: DC
  Administered 2012-06-17: 1000 mL via INTRAVENOUS

## 2012-06-17 MED ORDER — POTASSIUM CHLORIDE 10 MEQ/100ML IV SOLN
10.0000 meq | INTRAVENOUS | Status: AC
  Administered 2012-06-18 (×2): 10 meq via INTRAVENOUS
  Filled 2012-06-17 (×3): qty 100

## 2012-06-17 MED ORDER — DEXTROSE-NACL 5-0.45 % IV SOLN
INTRAVENOUS | Status: DC
  Administered 2012-06-18: 01:00:00 via INTRAVENOUS

## 2012-06-17 MED ORDER — SODIUM CHLORIDE 0.9 % IV SOLN
INTRAVENOUS | Status: DC
  Filled 2012-06-17: qty 1

## 2012-06-17 NOTE — ED Provider Notes (Signed)
History     CSN: [id number]  Arrival date & time 06/17/12  4098   First MD Initiated Contact with Patient 06/17/12 [phone]      Chief Complaint  Patient presents with  . Hyperglycemia     HPI Pt was seen at 0940.  Per pt, c/o gradual onset and persistence of constantly "feeling like my sugar is high" for the past several days.  Pt states he has not taken his insulin or checked his sugar in a few weeks because "I just haven't."  Has been assoc with several episodes of N/V since yesterday after eating "a bag of chips."  Also c/o gradual onset and persistence of constant "cough" x1 month.  Denies CP/palpitations, no SOB, no fevers, no abd pain, no back pain, no diarrhea, no rash.     Past Medical History  Diagnosis Date  . Diabetes mellitus   . Depression   . Schizo affective schizophrenia   . Polysubstance abuse   . Scoliosis   . Chronic pain   . Noncompliance with medication regimen     Past Surgical History  Procedure Date  . I&d extremity 12/19/2011    Procedure: IRRIGATION AND DEBRIDEMENT EXTREMITY;  Surgeon: Sharma Covert;  Location: [facility] OR;  Service: Orthopedics;  Laterality: Left;     History  Substance Use Topics  . Smoking status: Current Some Day Smoker -- 0.5 packs/day  . Smokeless tobacco: Not on file  . Alcohol Use: No    Review of Systems ROS: Statement: All systems negative except as marked or noted in the HPI; Constitutional: Negative for fever and chills. ; ; Eyes: Negative for eye pain, redness and discharge. ; ; ENMT: Negative for ear pain, hoarseness, nasal congestion, sinus pressure and sore throat. ; ; Cardiovascular: Negative for chest pain, palpitations, diaphoresis, dyspnea and peripheral edema. ; ; Respiratory: +cough. Negative for wheezing and stridor. ; ; Gastrointestinal: +N/V. Negative for diarrhea, abdominal pain, blood in stool, hematemesis, jaundice and rectal bleeding. . ; ; Genitourinary: Negative for dysuria, flank pain and hematuria. ; ;  Musculoskeletal: Negative for back pain and neck pain. Negative for swelling and trauma.; ; Skin: Negative for pruritus, rash, abrasions, blisters, bruising and skin lesion.; ; Neuro: Negative for headache, lightheadedness and neck stiffness. Negative for weakness, altered level of consciousness , altered mental status, extremity weakness, paresthesias, involuntary movement, seizure and syncope.     Allergies  Percocet; Sulfa antibiotics; and Sulfa antibiotics  Home Medications   Current Outpatient Rx  Name Route Sig Dispense Refill  . BENZTROPINE MESYLATE 1 MG PO TABS Oral Take 1 mg by mouth 2 (two) times daily.    . BUSPIRONE HCL 10 MG PO TABS Oral Take 10 mg by mouth 2 (two) times daily.    [name] FLUOXETINE HCL 20 MG PO TABS Oral Take 20 mg by mouth every morning.    [name] GABAPENTIN 600 MG PO TABS Oral Take 600 mg by mouth 3 (three) times daily.    [name] HYDROXYZINE PAMOATE 25 MG PO CAPS Oral Take 25 mg by mouth 3 (three) times daily.    . INSULIN ASPART 100 UNIT/ML [place] SOLN Subcutaneous Inject 10-18 Units into the skin 3 (three) times daily before meals. Patient uses sliding scale. 1 vial 3  . INSULIN GLARGINE 100 UNIT/ML [place] SOLN Subcutaneous Inject 30 Units into the skin at bedtime. 10 mL 3  . OLANZAPINE 10 MG PO TBDP Oral Take 10 mg by mouth 2 (two) times daily.    [name]  TRAZODONE HCL 300 MG PO TABS Oral Take 300 mg by mouth at bedtime.      BP 127/86  Pulse 134  Temp 98 F (36.7 C) (Oral)  Resp 25  SpO2 100%  Physical Exam 0945: Physical examination:  Nursing notes reviewed; Vital signs and O2 SAT reviewed;  Constitutional:  Thin, In no acute distress; Head:  Normocephalic, atraumatic; Eyes: EOMI, PERRL, No scleral icterus; ENMT: Mouth and pharynx normal, Mucous membranes dry; Neck: Supple, Full range of motion, No lymphadenopathy; Cardiovascular: Tacycardic rate and rhythm, No gallop; Respiratory: Breath sounds clear & equal bilaterally, No rales, rhonchi, wheezes.  Speaking full sentences with  ease, Normal respiratory effort/excursion; Chest: Nontender, Movement normal; Abdomen: Soft, Nontender, Nondistended, Normal bowel sounds;; Extremities: Pulses normal, No tenderness, No edema, No calf edema or asymmetry.; Neuro: AA&Ox3, Major CN grossly intact. Speech clear. No gross focal motor or sensory deficits in extremities.; Skin: Color normal, Warm, Dry.    ED Course  Procedures   1020:  Pt told me he "didn't have" his meds, but ED RN was told that he had his medicines, but pt told her that he was "just too lazy" to take them.  T/C to Child psychotherapist, she will come speak with pt, but states pt has been through this several times, has Medicare and is aware he does not qualify for free meds.    MDM  MDM Reviewed: nursing note, previous chart and vitals Reviewed previous: labs and ECG Interpretation: labs, x-ray and ECG Total time providing critical care: 30-74 minutes. This excludes time spent performing separately reportable procedures and services. Consults: admitting MD   CRITICAL CARE Performed by: Laray Anger Total critical care time: 31 Critical care time was exclusive of separately billable procedures and treating other patients. Critical care was necessary to treat or prevent imminent or life-threatening deterioration. Critical care was time spent personally by me on the following activities: development of treatment plan with patient and/or surrogate as well as nursing, discussions with consultants, evaluation of patient's response to treatment, examination of patient, obtaining history from patient or surrogate, ordering and performing treatments and interventions, ordering and review of laboratory studies, ordering and review of radiographic studies, pulse oximetry and re-evaluation of patient's condition.    Date: 06/17/2012  Rate: 146  Rhythm: sinus tachycardia  QRS Axis: left  Intervals: normal  ST/T Wave abnormalities: normal  Conduction Disutrbances:left  anterior fascicular block  Narrative Interpretation:   Old EKG Reviewed: unchanged; no significant changes from previous EKG dated 04/25/2012.   Dg Chest 2 View 06/17/2012  *RADIOLOGY REPORT*  Clinical Data: Cough and congestion.  Nausea and vomiting.  CHEST - 2 VIEW  Comparison: 04/26/2012.  Findings: Trachea is midline.  Heart size normal.  Lungs are hyperinflated but clear.  No pleural fluid.  IMPRESSION: Hyperinflation.  No acute findings.  Original Report Authenticated By: Reyes Ivan, M.D.    Results for orders placed during the hospital encounter of 06/17/12  GLUCOSE, CAPILLARY      Component Value Range   Glucose-Capillary 432 (*) 70 - 99 mg/dL  BASIC METABOLIC PANEL      Component Value Range   Sodium 128 (*) 135 - 145 mEq/L   Potassium 4.3  3.5 - 5.1 mEq/L   Chloride 87 (*) 96 - 112 mEq/L   CO2 11 (*) 19 - 32 mEq/L   Glucose, Bld 425 (*) 70 - 99 mg/dL   BUN 10  6 - 23 mg/dL   Creatinine, Ser 1.61  0.50 -  1.35 mg/dL   Calcium 9.5  8.4 - 82.9 mg/dL   GFR calc non Af Amer >90  >90 mL/min   GFR calc Af Amer >90  >90 mL/min  CBC      Component Value Range   WBC 13.8 (*) 4.0 - 10.5 K/uL   RBC 6.04 (*) 4.22 - 5.81 MIL/uL   Hemoglobin 18.4 (*) 13.0 - 17.0 g/dL   HCT 56.2  13.0 - 86.5 %   MCV 84.6  78.0 - 100.0 fL   MCH 30.5  26.0 - 34.0 pg   MCHC 36.0  30.0 - 36.0 g/dL   RDW 78.4  69.6 - 29.5 %   Platelets 316  150 - 400 K/uL  BLOOD GAS, ARTERIAL      Component Value Range   O2 Content 2.0     Delivery systems NASAL CANNULA     pH, Arterial 7.250 (*) 7.350 - 7.450   pCO2 arterial 22.2 (*) 35.0 - 45.0 mmHg   pO2, Arterial 142.0 (*) 80.0 - 100.0 mmHg   Bicarbonate 9.4 (*) 20.0 - 24.0 mEq/L   TCO2 8.2  0 - 100 mmol/L   Acid-base deficit 16.3 (*) 0.0 - 2.0 mmol/L   O2 Saturation 98.3     Patient temperature 98.6     Collection site RIGHT RADIAL     Drawn by [id number]     Sample type ARTERIAL DRAW     Allens test (pass/fail) PASS  PASS  GLUCOSE, CAPILLARY       Component Value Range   Glucose-Capillary 387 (*) 70 - 99 mg/dL     4401:  BMP resulted, CO2 11, AG 11.  Pt has received 2+ L IVF, will start IV insulin per DKA protocol.  VS remain stable, resps easy.  No N/V or stooling while in the ED.  Needs admit.  Pt aware and agreeable.   12:26 PM:  T/C to [facility] Dr. Arthor Captain, case discussed, including:  HPI, pertinent PM/SHx, VS/PE, dx testing, ED course and treatment:  Agreeable to admit, requests to obtain stepdown bed to team 2.  1300:   Called into exam room.  Pt apparently does not want to be admitted.  Pt has gotten himself dressed, is pacing the exam room, talking on the cellphone, extending his arm out to remove his IV.  States he "has to leave to pay my bills and my rent."  Offered to make phone calls on his behalf, have Social Work/Case Management assist, etc.  Pt refuses and states he "has to go."  Pt verbalizes to myself, his ED RN and the Consulting civil engineer that he is aware he is "acidotic" due to not taking his insulin which has made him "sick."  States he has been taking his mental health meds every day "and seeing my psychiatrist every week," but "just didn't feel like" taking his insulin or checking his sugars.  States he has insulin at home and will take it when he gets home and "knows how to manage my diabetes" because he "has been doing this for a long time."  States he is aware he is "very sick" and "needs to be in the ICU" because of his diabetes, but states he "has to leave" and "knows how to handle this at home."  Refuses to stay any longer.  Pt refuses admission.  I, pt's ED RN and the Charge RN all strongly encouraged pt to stay, continues to refuse.  Pt makes his own medical decisions; does not appear to  be hallucinating, psychotic, responding to internal stimuli, or suicidal at this time.  Risks of AMA explained to pt, including, but not limited to:  stroke, heart attack, cardiac arrythmia ("irregular heart rate/beat"), "passing out," temporary  and/or permanent disability, death.  Pt verb understanding, repeating again to myself, the ED RN and the Charge RN what his dx was and what the risks of refusing treatment were, and continues to refuse admission, understanding the consequences of his decision.  I encouraged pt to follow up with his PMD tomorrow and return to the ED immediately if symptoms worsen, he changes his mind about admission, or for any other concerns.  Pt verb understanding, agreeable.  Pt left the ED with steady gait, easy resps, NAD, VSS.         Laray Anger, DO 06/19/12 1558

## 2012-06-17 NOTE — ED Notes (Signed)
Pt asked by Dr. Clarene Duke about mental health problems. Pt states "Ma'am that's none of your business I see a psychotherapeutic dr every week, my mental health is under control. I take my medication every day and have been taking it."

## 2012-06-17 NOTE — ED Notes (Signed)
Per EMS: Pt from home. Reports cbg: 380. States he has insulin at home, but has not taken it in a few weeks, states he is just lazy. Reports N/V/D starting last night and cough x 1 month. 20G IV started to LAC.

## 2012-06-17 NOTE — ED Notes (Signed)
EDP made aware pt wants something for heartburn

## 2012-06-17 NOTE — Progress Notes (Signed)
[facility] ED CM consulted by EDP, [place], [place] and ED RN about medication assistance This patient is listed as covered by Medicare therefore is not eligible for [facility] indigent medication assistance.  CM spoke with Dr Clarene Duke

## 2012-06-17 NOTE — ED Notes (Signed)
Pt irate and stating "I got to get out of here, I have to leave I have to pay my rent by 1:00pm, take this IV out of my arm I have a medical right to refuse treatment, I do not consent to anymore treatment, I want to leave AMA. Take this IV out of my arm ma'am. I want to leave."

## 2012-06-17 NOTE — ED Notes (Signed)
RUE:[id number]<JW> Expected date:06/17/12<BR> Expected time: 9:12 AM<BR> Means of arrival:Ambulance<BR> Comments:<BR> 48yoM,hyperglycemia,n/v

## 2012-06-17 NOTE — ED Notes (Signed)
Patient is refusing blood draw for labs.stated wants to leave AMA

## 2012-06-17 NOTE — H&P (Signed)
DATE OF ADMISSION:  06/17/2012  PCP:    Sheila Oats, MD   Chief Complaint:  Nausea and Vomiting and ABD Pain and Increased Blood Sugars   HPI: [name] is an 26 y.o. male with Type I Diabetes who presents with complaints of Increased Blood sugars and nausea vomiting and ABD pain for the past 3 days. He denies any fevers or chills or diarrhea.   He was seen earlier today in the ED and left AMA, because he reports that he had to leave to pay his rent.  He returned and was referred for medical admission to continue treating his DKA.    Past Medical History  Diagnosis Date  . Diabetes mellitus   . Depression   . Schizo affective schizophrenia   . Polysubstance abuse   . Scoliosis   . Chronic pain   . Noncompliance with medication regimen     Past Surgical History  Procedure Date  . I&d extremity 12/19/2011    Procedure: IRRIGATION AND DEBRIDEMENT EXTREMITY;  Surgeon: Sharma Covert;  Location: [facility] OR;  Service: Orthopedics;  Laterality: Left;    Medications:  HOME MEDS: Prior to Admission medications   Medication Sig Start Date End Date Taking? Authorizing Provider  benztropine (COGENTIN) 1 MG tablet Take 1 mg by mouth 2 (two) times daily.   Yes Historical Provider, MD  busPIRone (BUSPAR) 10 MG tablet Take 10 mg by mouth 2 (two) times daily.   Yes Historical Provider, MD  FLUoxetine (PROZAC) 20 MG tablet Take 20 mg by mouth every morning.   Yes Historical Provider, MD  gabapentin (NEURONTIN) 600 MG tablet Take 600 mg by mouth 3 (three) times daily.   Yes Historical Provider, MD  hydrOXYzine (VISTARIL) 25 MG capsule Take 25 mg by mouth 3 (three) times daily.   Yes Historical Provider, MD  insulin aspart (NOVOLOG) 100 UNIT/ML injection Inject 10-18 Units into the skin 3 (three) times daily before meals. Patient uses sliding scale. 04/17/12  Yes Lyanne Co, MD  insulin glargine (LANTUS) 100 UNIT/ML injection Inject 30 Units into the skin at bedtime. 04/17/12  Yes Lyanne Co, MD  OLANZapine zydis (ZYPREXA) 10 MG disintegrating tablet Take 10 mg by mouth 2 (two) times daily.   Yes Historical Provider, MD  trazodone (DESYREL) 300 MG tablet Take 300 mg by mouth at bedtime.   Yes Historical Provider, MD    Allergies:  Allergies  Allergen Reactions  . Percocet (Oxycodone-Acetaminophen) Swelling  . Sulfa Antibiotics Other (See Comments)    Unknown - Childhood  . Sulfa Antibiotics Other (See Comments)    unknown    Social History:   reports that he has been smoking.  He has never used smokeless tobacco. He reports that he uses illicit drugs (Marijuana). He reports that he does not drink alcohol.  Family History: History reviewed. No pertinent family history.  Review of Systems:  Positive for:  Indigestion/heartburn, ABD Pain, nausea and vomiting, and muscle weakness  The patient denies anorexia, fever, weight loss, vision loss, decreased hearing, hoarseness, chest pain, syncope, dyspnea on exertion, peripheral edema, balance deficits, hemoptysis, melena, hematochezia, severe  hematuria, incontinence, genital sores,, suspicious skin lesions, transient blindness, difficulty walking, depression, unusual weight change, abnormal bleeding, enlarged lymph nodes, angioedema, and breast masses.   Physical Exam:  GEN:  Pleasant  26 year old thin Caucasian male examined  and in no acute distress; cooperative with exam Filed Vitals:   06/17/12 1914 06/17/12 2046  BP: 137/86 144/87  Pulse:  117 115  Temp: 98.9 F (37.2 C)   TempSrc: Oral   Resp: 26 16  SpO2: 98% 100%   Blood pressure 144/87, pulse 115, temperature 98.9 F (37.2 C), temperature source Oral, resp. rate 16, SpO2 100.00%. PSYCH: He is alert and oriented x4; does not appear anxious does not appear depressed; affect is normal HEENT: Normocephalic and Atraumatic, Mucous membranes pink; PERRLA; EOM intact; Fundi:  Benign;  No scleral icterus, Nares: Patent, Oropharynx: Clear, Fair Dentition, Neck:   FROM, no cervical lymphadenopathy nor thyromegaly or carotid bruit; no JVD; Breasts:: Not examined CHEST WALL: No tenderness CHEST: Normal respiration, clear to auscultation bilaterally HEART: Regular rate and rhythm; no murmurs rubs or gallops BACK: No kyphosis or scoliosis; no CVA tenderness ABDOMEN: Positive Bowel Sounds, soft non-tender; no masses, no organomegaly. Rectal Exam: Not done EXTREMITIES: No bone or joint deformity; age-appropriate arthropathy of the hands and knees; no cyanosis, clubbing or edema; no ulcerations. Genitalia: not examined PULSES: 2+ and symmetric SKIN: Normal hydration no rash or ulceration CNS: Cranial nerves 2-12 grossly intact no focal neurologic deficit   Labs & Imaging Results for orders placed during the hospital encounter of 06/17/12 (from the past 48 hour(s))  GLUCOSE, CAPILLARY     Status: Abnormal   Collection Time   06/17/12  7:13 PM      Component Value Range Comment   Glucose-Capillary 377 (*) 70 - 99 mg/dL   CBC     Status: Abnormal   Collection Time   06/17/12  9:00 PM      Component Value Range Comment   WBC 19.8 (*) 4.0 - 10.5 K/uL    RBC 5.87 (*) 4.22 - 5.81 MIL/uL    Hemoglobin 18.2 (*) 13.0 - 17.0 g/dL    HCT 62.1  30.8 - 65.7 %    MCV 83.8  78.0 - 100.0 fL    MCH 31.0  26.0 - 34.0 pg    MCHC 37.0 (*) 30.0 - 36.0 g/dL    RDW 84.6  96.2 - 95.2 %    Platelets 347  150 - 400 K/uL   POCT I-STAT, CHEM 8     Status: Abnormal   Collection Time   06/17/12  9:21 PM      Component Value Range Comment   Sodium 134 (*) 135 - 145 mEq/L    Potassium 4.5  3.5 - 5.1 mEq/L    Chloride 104  96 - 112 mEq/L    BUN 7  6 - 23 mg/dL    Creatinine, Ser 8.41  0.50 - 1.35 mg/dL    Glucose, Bld 324 (*) 70 - 99 mg/dL    Calcium, Ion 4.01  0.27 - 1.32 mmol/L    TCO2 12  0 - 100 mmol/L    Hemoglobin 18.7 (*) 13.0 - 17.0 g/dL    HCT 25.3 (*) 66.4 - 52.0 %   URINE RAPID DRUG SCREEN (HOSP PERFORMED)     Status: Normal   Collection Time   06/17/12  9:34 PM        Component Value Range Comment   Opiates NONE DETECTED  NONE DETECTED    Cocaine NONE DETECTED  NONE DETECTED    Benzodiazepines NONE DETECTED  NONE DETECTED    Amphetamines NONE DETECTED  NONE DETECTED    Tetrahydrocannabinol NONE DETECTED  NONE DETECTED    Barbiturates NONE DETECTED  NONE DETECTED   GLUCOSE, CAPILLARY     Status: Abnormal   Collection Time   06/17/12  9:52  PM      Component Value Range Comment   Glucose-Capillary 296 (*) 70 - 99 mg/dL    Dg Chest 2 View  0/07/6577  *RADIOLOGY REPORT*  Clinical Data: Cough and congestion.  Nausea and vomiting.  CHEST - 2 VIEW  Comparison: 04/26/2012.  Findings: Trachea is midline.  Heart size normal.  Lungs are hyperinflated but clear.  No pleural fluid.  IMPRESSION: Hyperinflation.  No acute findings.  Original Report Authenticated By: Reyes Ivan, M.D.      Assessment/Plan: Present on Admission:  .DKA, type 1 .Diabetes type 1, uncontrolled .Hyponatremia .Schizophrenia, undifferentiated .Generalized anxiety disorder .Dehydration   Plan:    ADmit to Tlemetry Bed DKA Protocol IVFs Monitor Electrolytes and HB levels Reconcile Home Meds DVT prophylaxis and Tobacco Cessation Other plans as per orders.    CODE STATUS:      FULL CODE        Josecarlos Harriott C 06/17/2012, 11:11 PM

## 2012-06-17 NOTE — ED Provider Notes (Signed)
History     CSN: [id number]  Arrival date & time 06/17/12  1858   First MD Initiated Contact with Patient 06/17/12 2045      Chief Complaint  Patient presents with  . Hyperglycemia  . Diabetic Ketoacidosis     HPI The patient was seen earlier today in this emergency department and ended up leaving AMA at approximately 1:30 this afternoon. Patient was evaluated for having difficulty with his blood sugars. He has history of diabetes and was having polyuria and polydipsia. He states he been out of his insulin for at least the last month or so. Patient was evaluated in the ED and felt to be in diabetic ketoacidosis. Arrangements were made to admit him to the hospital. Patient decided to leave AMA. Patient states he took care of the business that he needed take care of and he returned here to the emergency room.  Labs from earlier today: Results for orders placed during the hospital encounter of 06/17/12  GLUCOSE, CAPILLARY  Component  Value  Range  Glucose-Capillary  432 (*)  70 - 99 mg/dL  BASIC METABOLIC PANEL  Component  Value  Range  Sodium  128 (*)  135 - 145 mEq/L  Potassium  4.3  3.5 - 5.1 mEq/L  Chloride  87 (*)  96 - 112 mEq/L  CO2  11 (*)  19 - 32 mEq/L  Glucose, Bld  425 (*)  70 - 99 mg/dL  BUN  10  6 - 23 mg/dL  Creatinine, Ser  4.09  0.50 - 1.35 mg/dL  Calcium  9.5  8.4 - 81.1 mg/dL  GFR calc non Af Amer  >90  >90 mL/min  GFR calc Af Amer  >90  >90 mL/min  CBC  Component  Value  Range  WBC  13.8 (*)  4.0 - 10.5 K/uL  RBC  6.04 (*)  4.22 - 5.81 MIL/uL  Hemoglobin  18.4 (*)  13.0 - 17.0 g/dL  HCT  91.4  78.2 - 95.6 %  MCV  84.6  78.0 - 100.0 fL  MCH  30.5  26.0 - 34.0 pg  MCHC  36.0  30.0 - 36.0 g/dL  RDW  21.3  08.6 - 57.8 %  Platelets  316  150 - 400 K/uL  BLOOD GAS, ARTERIAL  Component  Value  Range  O2 Content  2.0  Delivery systems  NASAL CANNULA  pH, Arterial  7.250 (*)  7.350 - 7.450  pCO2 arterial    22.2 (*)  35.0 - 45.0 mmHg  pO2, Arterial  142.0 (*)  80.0 - 100.0 mmHg  Bicarbonate  9.4 (*)  20.0 - 24.0 mEq/L  TCO2  8.2  0 - 100 mmol/L  Acid-base deficit  16.3 (*)  0.0 - 2.0 mmol/L  O2 Saturation  98.3  Patient temperature  98.6  Collection site  RIGHT RADIAL  Drawn by  [id number]  Sample type  ARTERIAL DRAW  Allens test (pass/fail)  PASS  PASS  GLUCOSE, CAPILLARY  Component  Value  Range  Glucose-Capillary  387 (*)  70 - 99 mg/dL   Past Medical History  Diagnosis Date  . Diabetes mellitus   . Depression   . Schizo affective schizophrenia   . Polysubstance abuse   . Scoliosis   . Chronic pain   . Noncompliance with medication regimen     Past Surgical History  Procedure Date  . I&d extremity 12/19/2011    Procedure: IRRIGATION AND DEBRIDEMENT EXTREMITY;  Surgeon: Sharma Covert;  Location: [facility] OR;  Service: Orthopedics;  Laterality: Left;    History reviewed. No pertinent family history.  History  Substance Use Topics  . Smoking status: Current Some Day Smoker -- 0.5 packs/day  . Smokeless tobacco: Never Used  . Alcohol Use: No      Review of Systems  All other systems reviewed and are negative.    Allergies  Percocet; Sulfa antibiotics; and Sulfa antibiotics  Home Medications   Current Outpatient Rx  Name Route Sig Dispense Refill  . BENZTROPINE MESYLATE 1 MG PO TABS Oral Take 1 mg by mouth 2 (two) times daily.    . BUSPIRONE HCL 10 MG PO TABS Oral Take 10 mg by mouth 2 (two) times daily.    [name] FLUOXETINE HCL 20 MG PO TABS Oral Take 20 mg by mouth every morning.    [name] GABAPENTIN 600 MG PO TABS Oral Take 600 mg by mouth 3 (three) times daily.    [name] HYDROXYZINE PAMOATE 25 MG PO CAPS Oral Take 25 mg by mouth 3 (three) times daily.    . INSULIN ASPART 100 UNIT/ML [place] SOLN Subcutaneous Inject 10-18 Units into the skin 3 (three) times daily before meals. Patient uses sliding scale. 1 vial 3  . INSULIN GLARGINE 100 UNIT/ML [place] SOLN Subcutaneous  Inject 30 Units into the skin at bedtime. 10 mL 3  . OLANZAPINE 10 MG PO TBDP Oral Take 10 mg by mouth 2 (two) times daily.    . TRAZODONE HCL 300 MG PO TABS Oral Take 300 mg by mouth at bedtime.      BP 144/87  Pulse 115  Temp 98.9 F (37.2 C) (Oral)  Resp 16  SpO2 100%  Physical Exam  Nursing note and vitals reviewed. Constitutional: No distress.       Malnourished, thin  HENT:  Head: Normocephalic and atraumatic.  Right Ear: External ear normal.  Left Ear: External ear normal.  Mouth/Throat: No oropharyngeal exudate (mucous membranes dry).  Eyes: Conjunctivae are normal. Right eye exhibits no discharge. Left eye exhibits no discharge. No scleral icterus.  Neck: Neck supple. No tracheal deviation present.  Cardiovascular: Regular rhythm and intact distal pulses.  Tachycardia present.   Pulmonary/Chest: Effort normal and breath sounds normal. No stridor. No respiratory distress. He has no wheezes. He has no rales.  Abdominal: Soft. Bowel sounds are normal. He exhibits no distension. There is no tenderness. There is no rebound and no guarding.  Musculoskeletal: He exhibits no edema and no tenderness.  Neurological: He is alert. He has normal strength. No sensory deficit. Cranial nerve deficit:  no gross defecits noted. He exhibits normal muscle tone. He displays no seizure activity. Coordination normal.  Skin: Skin is warm and dry. No rash noted.  Psychiatric: He has a normal mood and affect.    ED Course  Procedures (including critical care time)  Labs Reviewed  GLUCOSE, CAPILLARY - Abnormal; Notable for the following:    Glucose-Capillary 377 (*)     All other components within normal limits  POCT I-STAT, CHEM 8 - Abnormal; Notable for the following:    Sodium 134 (*)     Glucose, Bld 310 (*)     Hemoglobin 18.7 (*)     HCT 55.0 (*)     All other components within normal limits  CBC   Dg Chest 2 View  06/17/2012  *RADIOLOGY REPORT*  Clinical Data: Cough and  congestion.  Nausea and vomiting.  CHEST - 2 VIEW  Comparison: 04/26/2012.  Findings: Trachea is midline.  Heart size normal.  Lungs are hyperinflated but clear.  No pleural fluid.  IMPRESSION: Hyperinflation.  No acute findings.  Original Report Authenticated By: Reyes Ivan, M.D.      MDM  The patient was seen earlier this afternoon and diagnosed with diabetic ketoacidosis. He had a bicarbonate of 11. Patient was given IV fluids and arrangements were made for admission. Patient and decided to leave AMA. He has returned to receive treatment at this time. I will repeat his electrolyte panel. IV fluids and insulin infusion has been ordered. I have ordered potassium IV simultaneously considering his potassium level earlier today. The patient remains hemodynamically stable. He'll be admitted to the hospital for further treatment and evaluation.       Celene Kras, MD 06/17/12 2133

## 2012-06-17 NOTE — ED Notes (Signed)
Pt continuing to state "I want to leave." Security and [employer] at bedside with Dr. Clarene Duke. Dr. Clarene Duke explaining to pt risks vs benefits of staying in hospital. Pt was offered social work services to assist with paying rent. Pt declined stating "Ma'am stop harrassing me, I'm ready to leave now, I have to go now. I don't need that. I am in my right mind and I have a right to sign out AMA."

## 2012-06-17 NOTE — ED Notes (Signed)
Heard pt yelling, went in to check on pt, pt sitting on the stretcher with his clothes on, yelling stating that he needs to be out of here by 1pm, he states "I have told them that I  Wanted to leave AMA an hour ago, I need to be somewhere at 1pm."  Pt yelling and keeps saying that he wants to leave AMA.  Stark Jock RN came in the room to start pt on insulin gtt.  Pt refused and states that he wants to leave AMA.  Stark Jock RN reminded pt that when the EDP was at bedside and that he had agreed when she informed that he will be admitted to the ICU unit.  Pt Security at bedside.  Pt is wanting to have his IV taken out.  Dr. Clarene Duke made aware, she went in the room to speak to pt.

## 2012-06-17 NOTE — ED Notes (Signed)
Returned from break w/ pt upset and yelling he wanted to leave AMA, EDP was at bedside explaining to pt he needed to go to ICU and he was very sick, pt was not listening, just kept stating he was leaving AMA and she could not hold him here. Was offered that someone would call his landlord and explain about his rent but he did not want that done. Security was at bedside. Pt does have a hx of suicidal ideations, pt states he has a doctor now and has his psych issues under control.

## 2012-06-17 NOTE — ED Notes (Signed)
Patient states that he left here earlier against medical advice. The patient reports that he has felt worse since he left here. The patient reports chest pain. BS is 377

## 2012-06-17 NOTE — ED Notes (Signed)
[id number]> Expected date:<BR> Expected time:<BR> Means of arrival:<BR> Comments:<BR> EMS, hyperglycemia

## 2012-06-18 DIAGNOSIS — E1110 Type 2 diabetes mellitus with ketoacidosis without coma: Secondary | ICD-10-CM

## 2012-06-18 DIAGNOSIS — E871 Hypo-osmolality and hyponatremia: Secondary | ICD-10-CM

## 2012-06-18 DIAGNOSIS — E1065 Type 1 diabetes mellitus with hyperglycemia: Secondary | ICD-10-CM

## 2012-06-18 DIAGNOSIS — F209 Schizophrenia, unspecified: Secondary | ICD-10-CM

## 2012-06-18 LAB — BASIC METABOLIC PANEL
BUN: 6 mg/dL (ref 6–23)
CO2: 13 mEq/L — ABNORMAL LOW (ref 19–32)
CO2: 15 mEq/L — ABNORMAL LOW (ref 19–32)
CO2: 17 mEq/L — ABNORMAL LOW (ref 19–32)
Calcium: 8.1 mg/dL — ABNORMAL LOW (ref 8.4–10.5)
Calcium: 8.1 mg/dL — ABNORMAL LOW (ref 8.4–10.5)
Chloride: 101 mEq/L (ref 96–112)
Chloride: 103 mEq/L (ref 96–112)
Chloride: 104 mEq/L (ref 96–112)
Chloride: 104 mEq/L (ref 96–112)
Chloride: 107 mEq/L (ref 96–112)
Creatinine, Ser: 0.44 mg/dL — ABNORMAL LOW (ref 0.50–1.35)
Creatinine, Ser: 0.48 mg/dL — ABNORMAL LOW (ref 0.50–1.35)
GFR calc Af Amer: >90 mL/min (ref >90)
GFR calc Af Amer: >90 mL/min (ref >90)
GFR calc non Af Amer: >90 mL/min (ref >90)
GFR calc non Af Amer: >90 mL/min (ref >90)
Glucose, Bld: 147 mg/dL — ABNORMAL HIGH (ref 70–99)
Glucose, Bld: 170 mg/dL — ABNORMAL HIGH (ref 70–99)
Potassium: 3.7 mEq/L (ref 3.5–5.1)
Potassium: 3.8 mEq/L (ref 3.5–5.1)
Potassium: 3.9 mEq/L (ref 3.5–5.1)
Potassium: 4.1 mEq/L (ref 3.5–5.1)
Sodium: 131 mEq/L — ABNORMAL LOW (ref 135–145)
Sodium: 132 mEq/L — ABNORMAL LOW (ref 135–145)
Sodium: 133 mEq/L — ABNORMAL LOW (ref 135–145)
Sodium: 133 mEq/L — ABNORMAL LOW (ref 135–145)

## 2012-06-18 LAB — GLUCOSE, CAPILLARY
Glucose-Capillary: 113 mg/dL — ABNORMAL HIGH (ref 70–99)
Glucose-Capillary: 119 mg/dL — ABNORMAL HIGH (ref 70–99)
Glucose-Capillary: 160 mg/dL — ABNORMAL HIGH (ref 70–99)
Glucose-Capillary: 174 mg/dL — ABNORMAL HIGH (ref 70–99)

## 2012-06-18 LAB — MRSA PCR SCREENING: MRSA by PCR: NEGATIVE

## 2012-06-18 MED ORDER — LORAZEPAM 2 MG/ML IJ SOLN
1.0000 mg | Freq: Once | INTRAMUSCULAR | Status: AC
  Administered 2012-06-18: 1 mg via INTRAVENOUS
  Filled 2012-06-18: qty 1

## 2012-06-18 MED ORDER — INSULIN GLARGINE 100 UNIT/ML ~~LOC~~ SOLN
10.0000 [IU] | Freq: Every day | SUBCUTANEOUS | Status: DC
  Administered 2012-06-18: 10 [IU] via SUBCUTANEOUS

## 2012-06-18 MED ORDER — INSULIN ASPART 100 UNIT/ML ~~LOC~~ SOLN: 0.0000 [IU] | SUBCUTANEOUS | Status: DC

## 2012-06-18 MED ORDER — LORAZEPAM 2 MG/ML IJ SOLN: 0.5000 mg | Freq: Four times a day (QID) | INTRAMUSCULAR | Status: DC | PRN

## 2012-06-18 MED ORDER — INSULIN ASPART 100 UNIT/ML ~~LOC~~ SOLN
0.0000 [IU] | Freq: Three times a day (TID) | SUBCUTANEOUS | Status: DC
  Administered 2012-06-18: 100 [IU] via SUBCUTANEOUS
  Administered 2012-06-18: 08:00:00 via SUBCUTANEOUS

## 2012-06-18 MED ORDER — ALUM & MAG HYDROXIDE-SIMETH 200-200-20 MG/5ML PO SUSP
15.0000 mL | ORAL | Status: DC | PRN
  Administered 2012-06-18: 15 mL via ORAL
  Filled 2012-06-18: qty 30

## 2012-06-18 MED ORDER — SODIUM CHLORIDE 0.9 % IV SOLN
INTRAVENOUS | Status: DC
  Administered 2012-06-18 (×2): via INTRAVENOUS

## 2012-06-18 MED ORDER — NICOTINE 21 MG/24HR TD PT24
21.0000 mg | MEDICATED_PATCH | Freq: Every day | TRANSDERMAL | Status: DC
  Administered 2012-06-18: 21 mg via TRANSDERMAL
  Filled 2012-06-18: qty 1

## 2012-06-18 MED ORDER — LORAZEPAM 0.5 MG PO TABS
0.5000 mg | ORAL_TABLET | Freq: Four times a day (QID) | ORAL | Status: DC | PRN
  Administered 2012-06-18: 0.5 mg via ORAL
  Filled 2012-06-18: qty 1

## 2012-06-18 NOTE — Discharge Summary (Signed)
Discharge Summary  [name] MR#: [id number]  DOB:1985-12-31  Date of Admission: 06/17/2012 Date of Discharge: 06/18/2012  Patient's PCP: Christopher Oats, MD  Attending Physician:Nur Krasinski   Discharge Diagnoses: Principal Problem:  *DKA, type 1 Active Problems:  Schizophrenia, undifferentiated  Generalized anxiety disorder  Diabetes type 1, uncontrolled  Hyponatremia  Dehydration   Brief Admitting History and Physical [name] is an 26 y.o. male with Type I Diabetes who presents with complaints of Increased Blood sugars and nausea vomiting and ABD pain for the past 3 days. He denies any fevers or chills or diarrhea. He was seen earlier today in the ED and left AMA, because he reports that he had to leave to pay his rent. He returned and was referred for medical admission to continue treating his DKA   Discharge Medications Medication List  As of 06/18/2012  3:53 PM   TAKE these medications         benztropine 1 MG tablet   Commonly known as: COGENTIN   Take 1 mg by mouth 2 (two) times daily.      busPIRone 10 MG tablet   Commonly known as: BUSPAR   Take 10 mg by mouth 2 (two) times daily.      FLUoxetine 20 MG tablet   Commonly known as: PROZAC   Take 20 mg by mouth every morning.      gabapentin 600 MG tablet   Commonly known as: NEURONTIN   Take 600 mg by mouth 3 (three) times daily.      hydrOXYzine 25 MG capsule   Commonly known as: VISTARIL   Take 25 mg by mouth 3 (three) times daily.      insulin aspart 100 UNIT/ML injection   Commonly known as: novoLOG   Inject 10-18 Units into the skin 3 (three) times daily before meals. Patient uses sliding scale.      insulin glargine 100 UNIT/ML injection   Commonly known as: LANTUS   Inject 30 Units into the skin at bedtime.      OLANZapine zydis 10 MG disintegrating tablet   Commonly known as: ZYPREXA   Take 10 mg by mouth 2 (two) times daily.      trazodone 300 MG tablet   Commonly known as:  DESYREL   Take 300 mg by mouth at bedtime.            Hospital Course: DKA, type 1- frequently here for DKA- gap closed, off gtt, eating, refuses his medication as it stresses him out to take.  He plan to go back to [place] soon for care and declines follow up     Day of Discharge BP 115/76  Pulse 90  Temp 98.1 F (36.7 C) (Oral)  Resp 18  Ht 6\' 2"  (1.88 m)  Wt 64.4 kg (141 lb 15.6 oz)  BMI 18.23 kg/m2  SpO2 100% See progress note for physical exam  Results for orders placed during the hospital encounter of 06/17/12 (from the past 48 hour(s))  GLUCOSE, CAPILLARY     Status: Abnormal   Collection Time   06/17/12  7:13 PM      Component Value Range Comment   Glucose-Capillary 377 (*) 70 - 99 mg/dL   CBC     Status: Abnormal   Collection Time   06/17/12  9:00 PM      Component Value Range Comment   WBC 19.8 (*) 4.0 - 10.5 K/uL    RBC 5.87 (*) 4.22 - 5.81 MIL/uL  Hemoglobin 18.2 (*) 13.0 - 17.0 g/dL    HCT 43.3  29.5 - 18.8 %    MCV 83.8  78.0 - 100.0 fL    MCH 31.0  26.0 - 34.0 pg    MCHC 37.0 (*) 30.0 - 36.0 g/dL    RDW 41.6  60.6 - 30.1 %    Platelets 347  150 - 400 K/uL   POCT I-STAT, CHEM 8     Status: Abnormal   Collection Time   06/17/12  9:21 PM      Component Value Range Comment   Sodium 134 (*) 135 - 145 mEq/L    Potassium 4.5  3.5 - 5.1 mEq/L    Chloride 104  96 - 112 mEq/L    BUN 7  6 - 23 mg/dL    Creatinine, Ser 6.01  0.50 - 1.35 mg/dL    Glucose, Bld 093 (*) 70 - 99 mg/dL    Calcium, Ion 2.35  5.73 - 1.32 mmol/L    TCO2 12  0 - 100 mmol/L    Hemoglobin 18.7 (*) 13.0 - 17.0 g/dL    HCT 22.0 (*) 25.4 - 52.0 %   URINE RAPID DRUG SCREEN (HOSP PERFORMED)     Status: Normal   Collection Time   06/17/12  9:34 PM      Component Value Range Comment   Opiates NONE DETECTED  NONE DETECTED    Cocaine NONE DETECTED  NONE DETECTED    Benzodiazepines NONE DETECTED  NONE DETECTED    Amphetamines NONE DETECTED  NONE DETECTED    Tetrahydrocannabinol NONE DETECTED   NONE DETECTED    Barbiturates NONE DETECTED  NONE DETECTED   GLUCOSE, CAPILLARY     Status: Abnormal   Collection Time   06/17/12  9:52 PM      Component Value Range Comment   Glucose-Capillary 296 (*) 70 - 99 mg/dL   GLUCOSE, CAPILLARY     Status: Abnormal   Collection Time   06/17/12 11:13 PM      Component Value Range Comment   Glucose-Capillary 224 (*) 70 - 99 mg/dL   BASIC METABOLIC PANEL     Status: Abnormal   Collection Time   06/17/12 11:30 PM      Component Value Range Comment   Sodium 133 (*) 135 - 145 mEq/L    Potassium 3.9  3.5 - 5.1 mEq/L    Chloride 103  96 - 112 mEq/L    CO2 12 (*) 19 - 32 mEq/L    Glucose, Bld 200 (*) 70 - 99 mg/dL    BUN 6  6 - 23 mg/dL    Creatinine, Ser 2.70 (*) 0.50 - 1.35 mg/dL    Calcium 8.1 (*) 8.4 - 10.5 mg/dL    GFR calc non Af Amer >90  >90 mL/min    GFR calc Af Amer >90  >90 mL/min   CBC     Status: Abnormal   Collection Time   06/17/12 11:30 PM      Component Value Range Comment   WBC 17.5 (*) 4.0 - 10.5 K/uL    RBC 5.22  4.22 - 5.81 MIL/uL    Hemoglobin 15.9  13.0 - 17.0 g/dL    HCT 62.3  76.2 - 83.1 %    MCV 84.7  78.0 - 100.0 fL    MCH 30.5  26.0 - 34.0 pg    MCHC 36.0  30.0 - 36.0 g/dL    RDW 51.7  61.6 - 07.3 %  Platelets 294  150 - 400 K/uL   GLUCOSE, CAPILLARY     Status: Abnormal   Collection Time   06/18/12 12:14 AM      Component Value Range Comment   Glucose-Capillary 160 (*) 70 - 99 mg/dL   BASIC METABOLIC PANEL     Status: Abnormal   Collection Time   06/18/12  1:03 AM      Component Value Range Comment   Sodium 132 (*) 135 - 145 mEq/L    Potassium 3.8  3.5 - 5.1 mEq/L    Chloride 104  96 - 112 mEq/L    CO2 13 (*) 19 - 32 mEq/L    Glucose, Bld 155 (*) 70 - 99 mg/dL    BUN 6  6 - 23 mg/dL    Creatinine, Ser 1.61 (*) 0.50 - 1.35 mg/dL    Calcium 8.0 (*) 8.4 - 10.5 mg/dL    GFR calc non Af Amer >90  >90 mL/min    GFR calc Af Amer >90  >90 mL/min   GLUCOSE, CAPILLARY     Status: Abnormal   Collection Time   06/18/12   1:26 AM      Component Value Range Comment   Glucose-Capillary 120 (*) 70 - 99 mg/dL   GLUCOSE, CAPILLARY     Status: Abnormal   Collection Time   06/18/12  1:57 AM      Component Value Range Comment   Glucose-Capillary 113 (*) 70 - 99 mg/dL   GLUCOSE, CAPILLARY     Status: Abnormal   Collection Time   06/18/12  3:02 AM      Component Value Range Comment   Glucose-Capillary 119 (*) 70 - 99 mg/dL   BASIC METABOLIC PANEL     Status: Abnormal   Collection Time   06/18/12  3:10 AM      Component Value Range Comment   Sodium 133 (*) 135 - 145 mEq/L    Potassium 3.7  3.5 - 5.1 mEq/L    Chloride 107  96 - 112 mEq/L    CO2 15 (*) 19 - 32 mEq/L    Glucose, Bld 109 (*) 70 - 99 mg/dL    BUN 6  6 - 23 mg/dL    Creatinine, Ser 0.96 (*) 0.50 - 1.35 mg/dL    Calcium 8.1 (*) 8.4 - 10.5 mg/dL    GFR calc non Af Amer >90  >90 mL/min    GFR calc Af Amer >90  >90 mL/min   GLUCOSE, CAPILLARY     Status: Abnormal   Collection Time   06/18/12  4:05 AM      Component Value Range Comment   Glucose-Capillary 122 (*) 70 - 99 mg/dL   BASIC METABOLIC PANEL     Status: Abnormal   Collection Time   06/18/12  4:52 AM      Component Value Range Comment   Sodium 131 (*) 135 - 145 mEq/L    Potassium 4.1  3.5 - 5.1 mEq/L    Chloride 104  96 - 112 mEq/L    CO2 13 (*) 19 - 32 mEq/L    Glucose, Bld 147 (*) 70 - 99 mg/dL    BUN 6  6 - 23 mg/dL    Creatinine, Ser 0.45 (*) 0.50 - 1.35 mg/dL    Calcium 8.4  8.4 - 40.9 mg/dL    GFR calc non Af Amer >90  >90 mL/min    GFR calc Af Amer >90  >90 mL/min  GLUCOSE, CAPILLARY     Status: Abnormal   Collection Time   06/18/12  5:12 AM      Component Value Range Comment   Glucose-Capillary 191 (*) 70 - 99 mg/dL   GLUCOSE, CAPILLARY     Status: Abnormal   Collection Time   06/18/12  7:38 AM      Component Value Range Comment   Glucose-Capillary 176 (*) 70 - 99 mg/dL   MRSA PCR SCREENING     Status: Normal   Collection Time   06/18/12 10:18 AM      Component Value Range  Comment   MRSA by PCR NEGATIVE  NEGATIVE   GLUCOSE, CAPILLARY     Status: Abnormal   Collection Time   06/18/12 11:46 AM      Component Value Range Comment   Glucose-Capillary 174 (*) 70 - 99 mg/dL   BASIC METABOLIC PANEL     Status: Abnormal   Collection Time   06/18/12  2:10 PM      Component Value Range Comment   Sodium 130 (*) 135 - 145 mEq/L    Potassium 3.5  3.5 - 5.1 mEq/L    Chloride 101  96 - 112 mEq/L    CO2 17 (*) 19 - 32 mEq/L    Glucose, Bld 170 (*) 70 - 99 mg/dL    BUN 6  6 - 23 mg/dL    Creatinine, Ser 1.19 (*) 0.50 - 1.35 mg/dL    Calcium 8.1 (*) 8.4 - 10.5 mg/dL    GFR calc non Af Amer >90  >90 mL/min    GFR calc Af Amer >90  >90 mL/min     Dg Chest 2 View  06/17/2012  *RADIOLOGY REPORT*  Clinical Data: Cough and congestion.  Nausea and vomiting.  CHEST - 2 VIEW  Comparison: 04/26/2012.  Findings: Trachea is midline.  Heart size normal.  Lungs are hyperinflated but clear.  No pleural fluid.  IMPRESSION: Hyperinflation.  No acute findings.  Original Report Authenticated By: Reyes Ivan, M.D.     Disposition: home  Diet: diabetic  Activity: as tolerated   Follow-up Appts: Discharge Orders    Future Orders Please Complete By Expires   Diet Carb Modified      Scheduling Instructions:   Try clear liquids and then advance diet as tolerated   Increase activity slowly      Discharge instructions      Comments:   Take lantus as prescribed Drink lots of fluid       Time spent on discharge, talking to the patient, and coordinating care: 45 mins.   SignedMarlin Canary, DO 06/18/2012, 3:53 PM

## 2012-06-18 NOTE — Progress Notes (Signed)
Inpatient Diabetes Program Recommendations  AACE/ADA: New Consensus Statement on Inpatient Glycemic Control (2009)  Target Ranges:  Prepandial:   less than 140 mg/dL      Peak postprandial:   less than 180 mg/dL (1-2 hours)      Critically ill patients:  140 - 180 mg/dL   Reason for Visit: DKA    [name] is an 26 y.o. male with Type I Diabetes who presented on 06/17/12 with complaints of Increased Blood sugars and nausea vomiting and ABD pain for the past 3 days. He denied any fevers or chills or diarrhea. He was seen earlier that day in the ED and left AMA, because he reports that he had to leave to pay his rent. He returned and was referred for medical admission to continue treating his DKA.   Very familiar with pt from previous visits.  Has had 11 ED visits since 12/2011.  York Spaniel he has just decided that he doesn't want to take his insulin - it stresses him out.  When writer asked if the injections hurt, he said, "No, I'm lazy and I just don't want to take it."  Long discussion about why he needs insulin and he said he knows... And he's heard this numerous times before.    Became tearful and said that he had no family and no friends and he needs someone to be with.  Tried to be supportive.  He said that he may possibly move back to [place] where he has a friend.    Results for BAYLEY, HURN (MRN [id number]) as of 06/18/2012 14:30  Ref. Range 06/18/2012 03:02 06/18/2012 04:05 06/18/2012 05:12 06/18/2012 07:38 06/18/2012 11:46  Glucose-Capillary Latest Range: 70-99 mg/dL 962 (H) 952 (H) 841 (H) 176 (H) 174 (H)    Will need meal coverage insulin.  Please add Novolog 4 units tidwc.  Titrate Lantus to 30 units QHS.  Will follow.

## 2012-06-18 NOTE — Progress Notes (Signed)
[facility] PROGRESS NOTE  Elihu Milstein ZOX:[id number] DOB: 15-Oct-1986 DOA: 06/17/2012 PCP: Sheila Oats, MD  Brief narrative: [name] is an 26 y.o. male with Type I Diabetes who presented on 06/17/12 with complaints of Increased Blood sugars and nausea vomiting and ABD pain for the past 3 days. He denied any fevers or chills or diarrhea. He was seen earlier that day in the ED and left AMA, because he reports that he had to leave to pay his rent. He returned and was referred for medical admission to continue treating his DKA.    Consultants:    Procedures:    Antibiotics:    HPI/Subjective: Lying in bed, eyes closed, easily aroused. Reports abd pain/nausea. Reports taking inusulin "too stressful". NAD  Objective: Filed Vitals:   06/17/12 1914 06/17/12 2046 06/18/12 0017 06/18/12 0404  BP: 137/86 144/87 112/71 100/64  Pulse: 117 115 107 86  Temp: 98.9 F (37.2 C)  98.2 F (36.8 C) 98.3 F (36.8 C)  TempSrc: Oral  Oral Oral  Resp: 26 16 18 18   Height:   6\' 2"  (1.88 m)   Weight:   64.4 kg (141 lb 15.6 oz)   SpO2: 98% 100% 100% 100%    Intake/Output Summary (Last 24 hours) at 06/18/12 1015 Last data filed at 06/18/12 0900  Gross per 24 hour  Intake 252.43 ml  Output      0 ml  Net 252.43 ml    Exam:   General:  Awake alert oriented x3. NAD  Cardiovascular: RRR, no MGR,No LEE,PPP  Respiratory: normal effort. BSCTAB, no wheeze, no rhonchi  Abdomen: flat soft +bs. No guarding rebound  Data Reviewed: Basic Metabolic Panel:  Lab 06/18/12 8119 06/18/12 0310 06/18/12 0103 06/17/12 2330 06/17/12 2121 06/17/12 1007  NA 131* 133* 132* 133* 134* --  K 4.1 3.7 3.8 3.9 4.5 --  CL 104 107 104 103 104 --  CO2 13* 15* 13* 12* -- 11*  GLUCOSE 147* 109* 155* 200* 310* --  BUN 6 6 6 6 7  --  CREATININE 0.39* 0.40* 0.44* 0.49* 0.70 --  CALCIUM 8.4 8.1* 8.0* 8.1* -- 9.5  MG -- -- -- -- -- --  PHOS -- -- -- -- -- --   Liver Function Tests: No results  found for this basename: AST:5,ALT:5,ALKPHOS:5,BILITOT:5,PROT:5,ALBUMIN:5 in the last 168 hours No results found for this basename: LIPASE:5,AMYLASE:5 in the last 168 hours No results found for this basename: AMMONIA:5 in the last 168 hours CBC:  Lab 06/17/12 2330 06/17/12 2121 06/17/12 2100 06/17/12 1226 06/17/12 1007  WBC 17.5* -- 19.8* -- 13.8*  NEUTROABS -- -- -- -- --  HGB 15.9 18.7* 18.2* 20.1* 18.4*  HCT 44.2 55.0* 49.2 59.0* 51.1  MCV 84.7 -- 83.8 -- 84.6  PLT 294 -- 347 -- 316   Cardiac Enzymes: No results found for this basename: CKTOTAL:5,CKMB:5,CKMBINDEX:5,TROPONINI:5 in the last 168 hours BNP: No components found with this basename: POCBNP:5 CBG:  Lab 06/18/12 0738 06/18/12 0512 06/18/12 0405 06/18/12 0302 06/18/12 0157  GLUCAP 176* 191* 122* 119* 113*    No results found for this or any previous visit (from the past 240 hour(s)).   Studies: Dg Chest 2 View  06/17/2012  *RADIOLOGY REPORT*  Clinical Data: Cough and congestion.  Nausea and vomiting.  CHEST - 2 VIEW  Comparison: 04/26/2012.  Findings: Trachea is midline.  Heart size normal.  Lungs are hyperinflated but clear.  No pleural fluid.  IMPRESSION: Hyperinflation.  No acute findings.  Original Report Authenticated By:  Reyes Ivan, M.D.    Scheduled Meds:   . sodium chloride  1,000 mL Intravenous Once   Followed by  . sodium chloride  1,000 mL Intravenous Once  . enoxaparin  40 mg Subcutaneous QHS  . gi cocktail  30 mL Oral Once  . insulin aspart  0-15 Units Subcutaneous TID WC  . insulin glargine  10 Units Subcutaneous QHS  . LORazepam  1 mg Intravenous Once  . pantoprazole (PROTONIX) IV  40 mg Intravenous Q12H  . potassium chloride  10 mEq Intravenous Q1H  . potassium chloride  10 mEq Intravenous Q1H  . DISCONTD: insulin aspart  0-4 Units Subcutaneous Q4H   Continuous Infusions:   . sodium chloride 1,000 mL (06/17/12 2258)  . sodium chloride 999 mL/hr at 06/17/12 2157  . sodium chloride 150  mL/hr at 06/17/12 2158  . sodium chloride Stopped (06/17/12 2315)  . sodium chloride Stopped (06/17/12 2315)  . sodium chloride 100 mL/hr at 06/18/12 0528  . insulin (NOVOLIN-R) infusion 0.5 Units/hr (06/18/12 0203)  . DISCONTD: dextrose 5 % and 0.45% NaCl    . DISCONTD: dextrose 5 % and 0.45% NaCl 100 mL/hr at 06/18/12 0118  . DISCONTD: insulin (NOVOLIN-R) infusion 3.3 Units/hr (06/17/12 2319)     Assessment/Plan: 1.DKA, type 1: resolving. Off glucomander Anion gap 14. Will continue CBG AC and HS. Continue lantus and SSI. Monitor. .Diabetes type 1, uncontrolled. A1C 11.9 in march. Reports non compliance with insulin. Has been educated/counseled on multiple occasions regarding importance of compliance (per chart review).  .Hyponatremia: related to #1. Will continue IV fluids and check bmet this afternoon.  .Schizophrenia, undifferentiated: at baseline .Generalized anxiety disorder: at baseline .Dehydration : continue vigorous IV hydration. Monitor closely.  Abd pain/nausea: likely gastroparesis secondary to #1 and #2. Will provide anti-emetic. Monitor.      Code Status: full Family Communication:  Disposition Plan: home when medically stable   Gwenyth Bender, NP  [facility] Pager [phone]  If 7PM-7AM, please contact night-coverage [email or web address] Password [email or web address] 06/18/2012, 10:15 AM   LOS: 1 day       Patient is being D/C 'd please see D/C summary.  Marlin Canary

## 2012-06-18 NOTE — Plan of Care (Signed)
Problem: Phase I Progression Outcomes Goal: Order "Choose to Live" book from Pharmacy Outcome: Not Applicable Date Met:  06/18/12 Pt has this book. He has been admitted multiple times for hyperglycemia.

## 2014-11-17 ENCOUNTER — Emergency Department (INDEPENDENT_AMBULATORY_CARE_PROVIDER_SITE_OTHER)
Admission: EM | Admit: 2014-11-17 | Discharge: 2014-11-17 | Discharge status: Against Medical Advice | Payer: Medicare Other | Source: Home / Self Care | Attending: Family Medicine | Admitting: Family Medicine

## 2014-11-17 ENCOUNTER — Emergency Department (HOSPITAL_COMMUNITY)
Admission: EM | Admit: 2014-11-17 | Discharge: 2014-11-17 | Disposition: A | Discharge status: Home / Self Care | Payer: Medicare Other | Source: Home / Self Care | Attending: Emergency Medicine | Admitting: Emergency Medicine

## 2014-11-17 ENCOUNTER — Encounter (HOSPITAL_COMMUNITY): Payer: Self-pay | Admitting: Emergency Medicine

## 2014-11-17 DIAGNOSIS — E1065 Type 1 diabetes mellitus with hyperglycemia: Secondary | ICD-10-CM

## 2014-11-17 DIAGNOSIS — R000 Tachycardia, unspecified: Secondary | ICD-10-CM | POA: Insufficient documentation

## 2014-11-17 DIAGNOSIS — F419 Anxiety disorder, unspecified: Secondary | ICD-10-CM | POA: Insufficient documentation

## 2014-11-17 DIAGNOSIS — IMO0002 Reserved for concepts with insufficient information to code with codable children: Secondary | ICD-10-CM

## 2014-11-17 DIAGNOSIS — M419 Scoliosis, unspecified: Secondary | ICD-10-CM

## 2014-11-17 DIAGNOSIS — E1165 Type 2 diabetes mellitus with hyperglycemia: Secondary | ICD-10-CM

## 2014-11-17 DIAGNOSIS — F329 Major depressive disorder, single episode, unspecified: Secondary | ICD-10-CM | POA: Insufficient documentation

## 2014-11-17 DIAGNOSIS — F209 Schizophrenia, unspecified: Secondary | ICD-10-CM | POA: Insufficient documentation

## 2014-11-17 DIAGNOSIS — Z79899 Other long term (current) drug therapy: Secondary | ICD-10-CM

## 2014-11-17 DIAGNOSIS — Z720 Tobacco use: Secondary | ICD-10-CM

## 2014-11-17 DIAGNOSIS — Z9119 Patient's noncompliance with other medical treatment and regimen: Secondary | ICD-10-CM

## 2014-11-17 DIAGNOSIS — Z794 Long term (current) use of insulin: Secondary | ICD-10-CM | POA: Insufficient documentation

## 2014-11-17 DIAGNOSIS — E1010 Type 1 diabetes mellitus with ketoacidosis without coma: Secondary | ICD-10-CM | POA: Diagnosis not present

## 2014-11-17 DIAGNOSIS — G8929 Other chronic pain: Secondary | ICD-10-CM | POA: Insufficient documentation

## 2014-11-17 DIAGNOSIS — M25559 Pain in unspecified hip: Secondary | ICD-10-CM | POA: Diagnosis not present

## 2014-11-17 DIAGNOSIS — R739 Hyperglycemia, unspecified: Secondary | ICD-10-CM

## 2014-11-17 LAB — COMPREHENSIVE METABOLIC PANEL
ALT: 14 U/L (ref 0–53)
ANION GAP: 17 — AB (ref 5–15)
AST: 14 U/L (ref 0–37)
Albumin: 4.8 g/dL (ref 3.5–5.2)
Alkaline Phosphatase: 74 U/L (ref 39–117)
BUN: 11 mg/dL (ref 6–23)
CALCIUM: 9.7 mg/dL (ref 8.4–10.5)
CO2: 28 meq/L (ref 19–32)
CREATININE: 0.54 mg/dL (ref 0.50–1.35)
Chloride: 92 mEq/L — ABNORMAL LOW (ref 96–112)
GLUCOSE: 433 mg/dL — AB (ref 70–99)
Potassium: 4.4 mEq/L (ref 3.7–5.3)
Sodium: 137 mEq/L (ref 137–147)
TOTAL PROTEIN: 7.3 g/dL (ref 6.0–8.3)
Total Bilirubin: 0.4 mg/dL (ref 0.3–1.2)

## 2014-11-17 LAB — I-STAT CHEM 8, ED
BUN: 10 mg/dL (ref 6–23)
CHLORIDE: 96 meq/L (ref 96–112)
CREATININE: 0.6 mg/dL (ref 0.50–1.35)
Calcium, Ion: 1.11 mmol/L — ABNORMAL LOW (ref 1.12–1.23)
Glucose, Bld: 443 mg/dL — ABNORMAL HIGH (ref 70–99)
HCT: 48 % (ref 39.0–52.0)
Hemoglobin: 16.3 g/dL (ref 13.0–17.0)
Potassium: 4.4 mEq/L (ref 3.7–5.3)
Sodium: 136 mEq/L — ABNORMAL LOW (ref 137–147)
TCO2: 25 mmol/L (ref 0–100)

## 2014-11-17 LAB — POCT I-STAT, CHEM 8
BUN: 10 mg/dL (ref 6–23)
Calcium, Ion: 1.15 mmol/L (ref 1.12–1.23)
Chloride: 94 mEq/L — ABNORMAL LOW (ref 96–112)
Creatinine, Ser: 0.6 mg/dL (ref 0.50–1.35)
Glucose, Bld: 492 mg/dL — ABNORMAL HIGH (ref 70–99)
HCT: 47 % (ref 39.0–52.0)
Hemoglobin: 16 g/dL (ref 13.0–17.0)
POTASSIUM: 3.5 meq/L — AB (ref 3.7–5.3)
SODIUM: 135 meq/L — AB (ref 137–147)
TCO2: 23 mmol/L (ref 0–100)

## 2014-11-17 LAB — CBG MONITORING, ED: GLUCOSE-CAPILLARY: 427 mg/dL — AB (ref 70–99)

## 2014-11-17 LAB — CBC
HCT: 44.6 % (ref 39.0–52.0)
Hemoglobin: 16.4 g/dL (ref 13.0–17.0)
MCH: 32.1 pg (ref 26.0–34.0)
MCHC: 36.8 g/dL — ABNORMAL HIGH (ref 30.0–36.0)
MCV: 87.3 fL (ref 78.0–100.0)
PLATELETS: 297 10*3/uL (ref 150–400)
RBC: 5.11 MIL/uL (ref 4.22–5.81)
RDW: 11.8 % (ref 11.5–15.5)
WBC: 8.3 10*3/uL (ref 4.0–10.5)

## 2014-11-17 MED ORDER — DOXEPIN HCL 10 MG/ML PO CONC: 100.0000 mg | Freq: Every day | ORAL | Status: DC

## 2014-11-17 MED ORDER — AMPHETAMINE-DEXTROAMPHETAMINE 20 MG PO TABS
40.0000 mg | ORAL_TABLET | Freq: Two times a day (BID) | ORAL | Status: DC
Start: 2014-11-17 — End: 2014-11-22

## 2014-11-17 MED ORDER — INSULIN DETEMIR 100 UNIT/ML ~~LOC~~ SOLN: 35.0000 [IU] | Freq: Every day | SUBCUTANEOUS | Status: DC

## 2014-11-17 MED ORDER — INSULIN ASPART 100 UNIT/ML ~~LOC~~ SOLN: 10.0000 [IU] | Freq: Three times a day (TID) | SUBCUTANEOUS | Status: DC

## 2014-11-17 NOTE — Discharge Instructions (Signed)
As discussed, your evaluation today has been largely reassuring.  But, it is important that you monitor your condition carefully, and do not hesitate to return to the ED if you develop new, or concerning changes in your condition. ? ?Otherwise, please follow-up with your physician for appropriate ongoing care. ? ?

## 2014-11-17 NOTE — ED Notes (Signed)
pts CBG reported to nurse ED.

## 2014-11-17 NOTE — ED Notes (Signed)
Reports out medications.    Pt triaged and assessed by provider.   Provider in before nurse.

## 2014-11-17 NOTE — ED Provider Notes (Signed)
Lavonte Palos is a 28 y.o. male who presents to Urgent Care today for refill of medication. Patient is here to refill his insulin Adderall with Toprol and trazodone. He recently moved to the area and notes that his medications were lost in the move. He states that he has an appointment in the next several days with a primary care provider. He has not had insulin for 3 days. He feels well. No fevers or chills.   Past Medical History  Diagnosis Date  . Diabetes mellitus   . Depression   . Schizo affective schizophrenia   . Polysubstance abuse   . Scoliosis   . Chronic pain   . Noncompliance with medication regimen    Past Surgical History  Procedure Laterality Date  . I&d extremity  12/19/2011    Procedure: IRRIGATION AND DEBRIDEMENT EXTREMITY;  Surgeon: Sharma Covert;  Location: [facility] OR;  Service: Orthopedics;  Laterality: Left;   History  Substance Use Topics  . Smoking status: Current Some Day Smoker -- 0.50 packs/day  . Smokeless tobacco: Never Used  . Alcohol Use: No   ROS as above Medications: No current facility-administered medications for this encounter.   Current Outpatient Prescriptions  Medication Sig Dispense Refill  . benztropine (COGENTIN) 1 MG tablet Take 1 mg by mouth 2 (two) times daily.    [name] FLUoxetine (PROZAC) 20 MG tablet Take 20 mg by mouth every morning.    . gabapentin (NEURONTIN) 600 MG tablet Take 600 mg by mouth 3 (three) times daily.    . hydrOXYzine (VISTARIL) 25 MG capsule Take 25 mg by mouth 3 (three) times daily.    . insulin aspart (NOVOLOG) 100 UNIT/ML injection Inject 10-18 Units into the skin 3 (three) times daily before meals. Patient uses sliding scale. 1 vial 3  . insulin glargine (LANTUS) 100 UNIT/ML injection Inject 30 Units into the skin at bedtime. 10 mL 3  . OLANZapine zydis (ZYPREXA) 10 MG disintegrating tablet Take 10 mg by mouth 2 (two) times daily.    . trazodone (DESYREL) 300 MG tablet Take 300 mg by mouth at bedtime.    . busPIRone  (BUSPAR) 10 MG tablet Take 10 mg by mouth 2 (two) times daily.    . [DISCONTINUED] citalopram (CELEXA) 20 MG tablet Take 20 mg by mouth daily.    . [DISCONTINUED] insulin lispro (HUMALOG) 100 UNIT/ML injection Inject 10 Units into the skin 3 (three) times daily before meals.      . [DISCONTINUED] sertraline (ZOLOFT) 50 MG tablet Take 50 mg by mouth daily. For depression. Just started med, have not picked up yet [facility] [address]     Allergies  Allergen Reactions  . Percocet [Oxycodone-Acetaminophen] Swelling  . Sulfa Antibiotics Other (See Comments)    Unknown - Childhood  . Sulfa Antibiotics Other (See Comments)    unknown     Exam:  BP 108/77 mmHg  Pulse 110  Temp(Src) 98.1 F (36.7 C) (Oral)  Resp 16  SpO2 100% Gen: Well NAD   Results for orders placed or performed during the hospital encounter of 11/17/14 (from the past 24 hour(s))  I-STAT, chem 8     Status: Abnormal   Collection Time: 11/17/14  1:19 PM  Result Value Ref Range   Sodium 135 (L) 137 - 147 mEq/L   Potassium 3.5 (L) 3.7 - 5.3 mEq/L   Chloride 94 (L) 96 - 112 mEq/L   BUN 10 6 - 23 mg/dL   Creatinine, Ser 2.70 0.50 -  1.35 mg/dL   Glucose, Bld 163 (H) 70 - 99 mg/dL   Calcium, Ion 8.46 6.59 - 1.23 mmol/L   TCO2 23 0 - 100 mmol/L   Hemoglobin 16.0 13.0 - 17.0 g/dL   HCT 93.5 70.1 - 77.9 %   No results found.  Assessment and Plan: 28 y.o. male with hyperglycemia. After labs were obtained and after interview but before exam patient left AMA. We had a discussion about what medicines I can and cannot fill a dislocation. He became very disappointed that I could not fill Adderall. After the i-STAT Chem-8 was obtained patient abruptly left. He did not notify staff. We are unable to contact him.  Discussed warning signs or symptoms. Please see discharge instructions. Patient expresses understanding.     Rodolph Bong, MD 11/17/14 [phone]

## 2014-11-17 NOTE — ED Notes (Signed)
Pt reports to staff that provider is doesn't know what he is doing and is not sympathetic at all.    From being in the room during the assessment the provider did show concern and compassion.  Pt was provided with information on where to get other meds refilled that we are not able to fill.  Mw,cma.

## 2014-11-17 NOTE — ED Notes (Signed)
Went to [facility] for blood sugar being high and med. Refills. cbg 426 this am. Referred here by [facility].  Needs adhd meds refilled.

## 2014-11-17 NOTE — ED Provider Notes (Signed)
CSN: [id number]     Arrival date & time 11/17/14  1408 History   First MD Initiated Contact with Patient 11/17/14 1535     No chief complaint on file.    (Consider location/radiation/quality/duration/timing/severity/associated sxs/prior Treatment) HPI  Patient presents with minimal physical concerns, but with general concern about missed medication dosing the past 3 days.  Patient has multiple medical problems, largely related to depression, prior substance abuse. Patient states that he is not currently using any illicit substances. Patient moved here from different state several days ago, lost medication in route. Patient currently denies lightheadedness, chest pain, dyspnea, nausea, a focal physical complaint. Patient states that he went to our isolated primary care office, was referred to our urgent care office for medication refills.  He did not receive these refills, became frustrated, and left. Patient still has no complaints, but is concerned about the lack of access to medication. Patient's evaluation at that facility was largely reassuring, though he left prior to completing his evaluation. Currently the patient has no new complaints, is awake, alert, appropriately interactive.    Past Medical History  Diagnosis Date  . Diabetes mellitus   . Depression   . Schizo affective schizophrenia   . Polysubstance abuse   . Scoliosis   . Chronic pain   . Noncompliance with medication regimen    Past Surgical History  Procedure Laterality Date  . I&d extremity  12/19/2011    Procedure: IRRIGATION AND DEBRIDEMENT EXTREMITY;  Surgeon: Sharma Covert;  Location: [facility] OR;  Service: Orthopedics;  Laterality: Left;   No family history on file. History  Substance Use Topics  . Smoking status: Current Some Day Smoker -- 0.50 packs/day  . Smokeless tobacco: Never Used  . Alcohol Use: No    Review of Systems  Constitutional:       Per HPI, otherwise negative  HENT:       Per HPI,  otherwise negative  Respiratory:       Per HPI, otherwise negative  Cardiovascular:       Per HPI, otherwise negative  Gastrointestinal: Negative for vomiting.  Endocrine:       Negative aside from HPI  Genitourinary:       Neg aside from HPI   Musculoskeletal:       Per HPI, otherwise negative  Skin: Positive for wound.  Neurological: Negative for syncope.  Psychiatric/Behavioral: Negative for suicidal ideas. The patient is nervous/anxious.       Allergies  Sulfa antibiotics  Home Medications   Prior to Admission medications   Medication Sig Start Date End Date Taking? Authorizing Provider  amphetamine-dextroamphetamine (ADDERALL) 20 MG tablet Take 2 tablets (40 mg total) by mouth 2 (two) times daily. 11/17/14   Gerhard Munch, MD  doxepin (SINEQUAN) 10 MG/ML solution Take 10 mLs (100 mg total) by mouth at bedtime. 11/17/14   Gerhard Munch, MD  insulin aspart (NOVOLOG) 100 UNIT/ML injection Inject 10 Units into the skin 3 (three) times daily with meals. Patient uses sliding scale. 11/17/14   Gerhard Munch, MD  insulin detemir (LEVEMIR) 100 UNIT/ML injection Inject 0.35 mLs (35 Units total) into the skin daily. 11/17/14   Gerhard Munch, MD   BP 120/88 mmHg  Pulse 108  Temp(Src) 97.8 F (36.6 C) (Oral)  Resp 16  SpO2 99% Physical Exam  Constitutional: He is oriented to person, place, and time. He appears well-developed. No distress.  HENT:  Head: Normocephalic and atraumatic.  Eyes: Conjunctivae and EOM are normal.  Cardiovascular:  Regular rhythm.  Tachycardia present.   Pulmonary/Chest: Effort normal. No stridor. No respiratory distress.  Abdominal: He exhibits no distension.  Musculoskeletal: He exhibits no edema.  Neurological: He is alert and oriented to person, place, and time.  Skin: Skin is warm and dry.  Multiple cutaneous lesions on each hand / wrist.  No confluent erythema, discharge, bleeding.  Psychiatric: His mood appears anxious. Cognition and memory  are not impaired. He expresses no homicidal and no suicidal ideation. He expresses no suicidal plans and no homicidal plans.  Nursing note and vitals reviewed.   ED Course  Procedures (including critical care time) Labs Review Labs Reviewed  COMPREHENSIVE METABOLIC PANEL - Abnormal; Notable for the following:    Chloride 92 (*)    Glucose, Bld 433 (*)    Anion gap 17 (*)    All other components within normal limits  CBC - Abnormal; Notable for the following:    MCHC 36.8 (*)    All other components within normal limits  I-STAT CHEM 8, ED - Abnormal; Notable for the following:    Sodium 136 (*)    Glucose, Bld 443 (*)    Calcium, Ion 1.11 (*)    All other components within normal limits  CBG MONITORING, ED - Abnormal; Notable for the following:    Glucose-Capillary 427 (*)    All other components within normal limits    I reviewed the patient's chart from our urgent care center. Labs notable for hyperglycemia, no anion gap. Patient is mild tachycardia, but no evidence for distress, and no physical complaint. I confirm the medications the patient is currently taking. Given the patient's normal blood pressure, he will not be restarted on his antihypertensives, but will follow up with primary care later this week for additional evaluation. MDM   Final diagnoses:  Hyperglycemia    Patient presents with concern of hyperglycemia, lack of medication access. Patient is awake alert, I will tachycardic, but otherwise hemodynamically stable. No evidence for anion gap acidosis, nor other substantial modalities when necessary hyperglycemia. Per his request, the patient was started on his medications, discharged in stable condition.    Gerhard Munch, MD 11/17/14 [phone]

## 2014-11-18 ENCOUNTER — Inpatient Hospital Stay (HOSPITAL_COMMUNITY): Payer: Medicare Other

## 2014-11-18 ENCOUNTER — Inpatient Hospital Stay (HOSPITAL_COMMUNITY)
Admission: EM | Admit: 2014-11-18 | Discharge: 2014-11-22 | DRG: 639 | Disposition: A | Discharge status: Home / Self Care | Payer: Medicare Other | Attending: Internal Medicine | Admitting: Internal Medicine

## 2014-11-18 ENCOUNTER — Encounter (HOSPITAL_COMMUNITY): Payer: Self-pay | Admitting: Emergency Medicine

## 2014-11-18 DIAGNOSIS — E1011 Type 1 diabetes mellitus with ketoacidosis with coma: Secondary | ICD-10-CM

## 2014-11-18 DIAGNOSIS — Z794 Long term (current) use of insulin: Secondary | ICD-10-CM

## 2014-11-18 DIAGNOSIS — Z833 Family history of diabetes mellitus: Secondary | ICD-10-CM | POA: Diagnosis not present

## 2014-11-18 DIAGNOSIS — F203 Undifferentiated schizophrenia: Secondary | ICD-10-CM | POA: Diagnosis present

## 2014-11-18 DIAGNOSIS — K219 Gastro-esophageal reflux disease without esophagitis: Secondary | ICD-10-CM | POA: Diagnosis present

## 2014-11-18 DIAGNOSIS — F29 Unspecified psychosis not due to a substance or known physiological condition: Secondary | ICD-10-CM | POA: Diagnosis present

## 2014-11-18 DIAGNOSIS — Z87898 Personal history of other specified conditions: Secondary | ICD-10-CM

## 2014-11-18 DIAGNOSIS — E1010 Type 1 diabetes mellitus with ketoacidosis without coma: Secondary | ICD-10-CM | POA: Diagnosis present

## 2014-11-18 DIAGNOSIS — Z720 Tobacco use: Secondary | ICD-10-CM | POA: Diagnosis not present

## 2014-11-18 DIAGNOSIS — M25559 Pain in unspecified hip: Secondary | ICD-10-CM

## 2014-11-18 DIAGNOSIS — F411 Generalized anxiety disorder: Secondary | ICD-10-CM | POA: Diagnosis present

## 2014-11-18 DIAGNOSIS — Z9114 Patient's other noncompliance with medication regimen: Secondary | ICD-10-CM | POA: Diagnosis present

## 2014-11-18 DIAGNOSIS — R451 Restlessness and agitation: Secondary | ICD-10-CM | POA: Diagnosis present

## 2014-11-18 DIAGNOSIS — E1110 Type 2 diabetes mellitus with ketoacidosis without coma: Secondary | ICD-10-CM | POA: Diagnosis present

## 2014-11-18 DIAGNOSIS — Z781 Physical restraint status: Secondary | ICD-10-CM

## 2014-11-18 DIAGNOSIS — F419 Anxiety disorder, unspecified: Secondary | ICD-10-CM | POA: Diagnosis present

## 2014-11-18 DIAGNOSIS — M25551 Pain in right hip: Secondary | ICD-10-CM | POA: Diagnosis present

## 2014-11-18 DIAGNOSIS — F1290 Cannabis use, unspecified, uncomplicated: Secondary | ICD-10-CM | POA: Diagnosis present

## 2014-11-18 DIAGNOSIS — Z882 Allergy status to sulfonamides status: Secondary | ICD-10-CM

## 2014-11-18 DIAGNOSIS — G8929 Other chronic pain: Secondary | ICD-10-CM | POA: Diagnosis present

## 2014-11-18 DIAGNOSIS — M25552 Pain in left hip: Secondary | ICD-10-CM | POA: Diagnosis present

## 2014-11-18 DIAGNOSIS — E0810 Diabetes mellitus due to underlying condition with ketoacidosis without coma: Secondary | ICD-10-CM | POA: Insufficient documentation

## 2014-11-18 DIAGNOSIS — E860 Dehydration: Secondary | ICD-10-CM | POA: Diagnosis present

## 2014-11-18 DIAGNOSIS — F329 Major depressive disorder, single episode, unspecified: Secondary | ICD-10-CM | POA: Diagnosis present

## 2014-11-18 DIAGNOSIS — F259 Schizoaffective disorder, unspecified: Secondary | ICD-10-CM | POA: Diagnosis present

## 2014-11-18 DIAGNOSIS — R52 Pain, unspecified: Secondary | ICD-10-CM

## 2014-11-18 DIAGNOSIS — Z046 Encounter for general psychiatric examination, requested by authority: Secondary | ICD-10-CM

## 2014-11-18 DIAGNOSIS — M419 Scoliosis, unspecified: Secondary | ICD-10-CM | POA: Diagnosis present

## 2014-11-18 DIAGNOSIS — E1065 Type 1 diabetes mellitus with hyperglycemia: Secondary | ICD-10-CM | POA: Diagnosis present

## 2014-11-18 DIAGNOSIS — IMO0002 Reserved for concepts with insufficient information to code with codable children: Secondary | ICD-10-CM | POA: Diagnosis present

## 2014-11-18 HISTORY — DX: Type 2 diabetes mellitus with ketoacidosis without coma: E11.10

## 2014-11-18 HISTORY — DX: Gastro-esophageal reflux disease without esophagitis: K21.9

## 2014-11-18 LAB — BASIC METABOLIC PANEL
ANION GAP: 18 — AB (ref 5–15)
Anion gap: 14 (ref 5–15)
BUN: 13 mg/dL (ref 6–23)
BUN: 13 mg/dL (ref 6–23)
CHLORIDE: 94 meq/L — AB (ref 96–112)
CO2: 27 mEq/L (ref 19–32)
CO2: 26 meq/L (ref 19–32)
CREATININE: 0.61 mg/dL (ref 0.50–1.35)
Calcium: 9.8 mg/dL (ref 8.4–10.5)
Calcium: 8.5 mg/dL (ref 8.4–10.5)
Chloride: 107 mEq/L (ref 96–112)
Creatinine, Ser: 0.93 mg/dL (ref 0.50–1.35)
GFR calc Af Amer: >90 mL/min (ref >90)
GFR calc Af Amer: >90 mL/min (ref >90)
GFR calc non Af Amer: >90 mL/min (ref >90)
GFR calc non Af Amer: >90 mL/min (ref >90)
Glucose, Bld: 504 mg/dL — ABNORMAL HIGH (ref 70–99)
Glucose, Bld: 140 mg/dL — ABNORMAL HIGH (ref 70–99)
POTASSIUM: 4 meq/L (ref 3.7–5.3)
Potassium: 3.4 mEq/L — ABNORMAL LOW (ref 3.7–5.3)
Sodium: 139 mEq/L (ref 137–147)
Sodium: 147 mEq/L (ref 137–147)

## 2014-11-18 LAB — URINALYSIS, ROUTINE W REFLEX MICROSCOPIC
BILIRUBIN URINE: NEGATIVE
Glucose, UA: >1000 mg/dL — AB
Ketones, ur: 40 mg/dL — AB
Leukocytes, UA: NEGATIVE
Nitrite: NEGATIVE
PROTEIN: NEGATIVE mg/dL
Specific Gravity, Urine: 1.037 — ABNORMAL HIGH (ref 1.005–1.030)
Urobilinogen, UA: 0.2 mg/dL (ref 0.0–1.0)
pH: 6.5 (ref 5.0–8.0)

## 2014-11-18 LAB — CBG MONITORING, ED
GLUCOSE-CAPILLARY: 451 mg/dL — AB (ref 70–99)
GLUCOSE-CAPILLARY: 138 mg/dL — AB (ref 70–99)
GLUCOSE-CAPILLARY: 159 mg/dL — AB (ref 70–99)
Glucose-Capillary: 254 mg/dL — ABNORMAL HIGH (ref 70–99)
Glucose-Capillary: 95 mg/dL (ref 70–99)
Glucose-Capillary: 107 mg/dL — ABNORMAL HIGH (ref 70–99)
Glucose-Capillary: 275 mg/dL — ABNORMAL HIGH (ref 70–99)

## 2014-11-18 LAB — D-DIMER, QUANTITATIVE (NOT AT ARMC)

## 2014-11-18 LAB — GLUCOSE, CAPILLARY
Glucose-Capillary: 184 mg/dL — ABNORMAL HIGH (ref 70–99)
Glucose-Capillary: 62 mg/dL — ABNORMAL LOW (ref 70–99)

## 2014-11-18 LAB — RAPID URINE DRUG SCREEN, HOSP PERFORMED
Amphetamines: NOT DETECTED
Barbiturates: NOT DETECTED
Benzodiazepines: NOT DETECTED
COCAINE: NOT DETECTED
OPIATES: NOT DETECTED
Tetrahydrocannabinol: POSITIVE — AB

## 2014-11-18 LAB — HEMOGLOBIN A1C
Hgb A1c MFr Bld: 11.5 % — ABNORMAL HIGH (ref <5.7)
Mean Plasma Glucose: 283 mg/dL — ABNORMAL HIGH (ref <117)

## 2014-11-18 LAB — URIC ACID: URIC ACID, SERUM: 3.6 mg/dL — AB (ref 4.0–7.8)

## 2014-11-18 LAB — URINE MICROSCOPIC-ADD ON

## 2014-11-18 MED ORDER — DEXTROSE-NACL 5-0.45 % IV SOLN
INTRAVENOUS | Status: DC
  Administered 2014-11-18: 05:00:00 via INTRAVENOUS

## 2014-11-18 MED ORDER — ACETAMINOPHEN 325 MG PO TABS: 650.0000 mg | ORAL_TABLET | Freq: Four times a day (QID) | ORAL | Status: DC | PRN

## 2014-11-18 MED ORDER — SODIUM CHLORIDE 0.9 % IV SOLN
1000.0000 mL | INTRAVENOUS | Status: DC
  Administered 2014-11-18: 1000 mL via INTRAVENOUS

## 2014-11-18 MED ORDER — CLONAZEPAM 1 MG PO TABS
1.0000 mg | ORAL_TABLET | Freq: Once | ORAL | Status: AC
  Administered 2014-11-18: 1 mg via ORAL
  Filled 2014-11-18: qty 1

## 2014-11-18 MED ORDER — SODIUM CHLORIDE 0.9 % IV BOLUS (SEPSIS)
1000.0000 mL | Freq: Once | INTRAVENOUS | Status: AC
  Administered 2014-11-18: 1000 mL via INTRAVENOUS

## 2014-11-18 MED ORDER — OLANZAPINE 5 MG PO TABS
5.0000 mg | ORAL_TABLET | Freq: Once | ORAL | Status: DC
  Filled 2014-11-18: qty 1

## 2014-11-18 MED ORDER — KETOROLAC TROMETHAMINE 30 MG/ML IJ SOLN
30.0000 mg | Freq: Once | INTRAMUSCULAR | Status: AC
  Administered 2014-11-18: 30 mg via INTRAVENOUS
  Filled 2014-11-18: qty 1

## 2014-11-18 MED ORDER — HALOPERIDOL 5 MG PO TABS
5.0000 mg | ORAL_TABLET | Freq: Four times a day (QID) | ORAL | Status: DC | PRN
  Administered 2014-11-21: 5 mg via ORAL
  Filled 2014-11-18 (×3): qty 1

## 2014-11-18 MED ORDER — SODIUM CHLORIDE 0.9 % IV SOLN
INTRAVENOUS | Status: DC
  Administered 2014-11-18: 1.9 [IU]/h via INTRAVENOUS

## 2014-11-18 MED ORDER — INSULIN DETEMIR 100 UNIT/ML ~~LOC~~ SOLN: 15.0000 [IU] | Freq: Every day | SUBCUTANEOUS | Status: DC

## 2014-11-18 MED ORDER — INSULIN DETEMIR 100 UNIT/ML ~~LOC~~ SOLN
25.0000 [IU] | Freq: Every day | SUBCUTANEOUS | Status: DC
  Filled 2014-11-18: qty 0.25

## 2014-11-18 MED ORDER — INSULIN ASPART 100 UNIT/ML ~~LOC~~ SOLN
0.0000 [IU] | Freq: Three times a day (TID) | SUBCUTANEOUS | Status: DC
  Administered 2014-11-18: 2 [IU] via SUBCUTANEOUS
  Administered 2014-11-19: 9 [IU] via SUBCUTANEOUS
  Administered 2014-11-20: 2 [IU] via SUBCUTANEOUS
  Administered 2014-11-20: 5 [IU] via SUBCUTANEOUS
  Administered 2014-11-21: 9 [IU] via SUBCUTANEOUS
  Administered 2014-11-21: 3 [IU] via SUBCUTANEOUS
  Administered 2014-11-21: 5 [IU] via SUBCUTANEOUS

## 2014-11-18 MED ORDER — INSULIN ASPART 100 UNIT/ML ~~LOC~~ SOLN
0.0000 [IU] | Freq: Every day | SUBCUTANEOUS | Status: DC
  Administered 2014-11-20: 2 [IU] via SUBCUTANEOUS

## 2014-11-18 MED ORDER — AMPHETAMINE-DEXTROAMPHETAMINE 10 MG PO TABS
40.0000 mg | ORAL_TABLET | Freq: Two times a day (BID) | ORAL | Status: DC
  Administered 2014-11-18: 40 mg via ORAL
  Filled 2014-11-18: qty 4

## 2014-11-18 MED ORDER — DEXTROSE-NACL 5-0.45 % IV SOLN: INTRAVENOUS | Status: DC

## 2014-11-18 MED ORDER — SODIUM CHLORIDE 0.9 % IV SOLN
INTRAVENOUS | Status: DC
  Administered 2014-11-18: 3.9 [IU]/h via INTRAVENOUS
  Filled 2014-11-18: qty 2.5

## 2014-11-18 MED ORDER — SODIUM CHLORIDE 0.9 % IV BOLUS (SEPSIS): 2000.0000 mL | Freq: Once | INTRAVENOUS | Status: DC

## 2014-11-18 MED ORDER — HEPARIN SODIUM (PORCINE) 5000 UNIT/ML IJ SOLN
5000.0000 [IU] | Freq: Three times a day (TID) | INTRAMUSCULAR | Status: DC
  Administered 2014-11-18 – 2014-11-22 (×14): 5000 [IU] via SUBCUTANEOUS
  Filled 2014-11-18 (×14): qty 1

## 2014-11-18 MED ORDER — INSULIN ASPART 100 UNIT/ML ~~LOC~~ SOLN
0.0000 [IU] | Freq: Three times a day (TID) | SUBCUTANEOUS | Status: DC
  Administered 2014-11-20: 3 [IU] via SUBCUTANEOUS

## 2014-11-18 MED ORDER — INSULIN DETEMIR 100 UNIT/ML ~~LOC~~ SOLN
10.0000 [IU] | Freq: Every day | SUBCUTANEOUS | Status: DC
  Administered 2014-11-18: 10 [IU] via SUBCUTANEOUS
  Filled 2014-11-18: qty 0.1

## 2014-11-18 MED ORDER — SODIUM CHLORIDE 0.9 % IV SOLN: INTRAVENOUS | Status: DC

## 2014-11-18 MED ORDER — CLONAZEPAM 1 MG PO TABS: 1.0000 mg | ORAL_TABLET | Freq: Once | ORAL | Status: DC

## 2014-11-18 MED ORDER — POTASSIUM CHLORIDE 10 MEQ/100ML IV SOLN
10.0000 meq | INTRAVENOUS | Status: AC
  Administered 2014-11-18 (×2): 10 meq via INTRAVENOUS
  Filled 2014-11-18 (×2): qty 100

## 2014-11-18 MED ORDER — DOXEPIN HCL 100 MG PO CAPS
100.0000 mg | ORAL_CAPSULE | Freq: Every day | ORAL | Status: DC
  Administered 2014-11-18 – 2014-11-21 (×4): 100 mg via ORAL
  Filled 2014-11-18 (×6): qty 1

## 2014-11-18 MED ORDER — KETOROLAC TROMETHAMINE 30 MG/ML IJ SOLN: 30.0000 mg | Freq: Four times a day (QID) | INTRAMUSCULAR | Status: AC | PRN

## 2014-11-18 MED ORDER — DOXEPIN HCL 10 MG/ML PO CONC
100.0000 mg | Freq: Every day | ORAL | Status: DC
  Filled 2014-11-18: qty 10

## 2014-11-18 MED ORDER — INSULIN DETEMIR 100 UNIT/ML ~~LOC~~ SOLN
15.0000 [IU] | Freq: Every day | SUBCUTANEOUS | Status: DC
  Administered 2014-11-19: 15 [IU] via SUBCUTANEOUS
  Filled 2014-11-18 (×2): qty 0.15

## 2014-11-18 MED ORDER — HALOPERIDOL LACTATE 5 MG/ML IJ SOLN
5.0000 mg | Freq: Four times a day (QID) | INTRAMUSCULAR | Status: DC | PRN
  Administered 2014-11-18 – 2014-11-20 (×3): 5 mg via INTRAMUSCULAR
  Filled 2014-11-18 (×3): qty 1

## 2014-11-18 MED ORDER — TRAMADOL HCL 50 MG PO TABS
100.0000 mg | ORAL_TABLET | Freq: Three times a day (TID) | ORAL | Status: DC | PRN
  Administered 2014-11-18 – 2014-11-20 (×5): 100 mg via ORAL
  Filled 2014-11-18 (×7): qty 2

## 2014-11-18 MED ORDER — HYDROXYZINE HCL 25 MG PO TABS
50.0000 mg | ORAL_TABLET | Freq: Once | ORAL | Status: AC
  Administered 2014-11-18: 50 mg via ORAL
  Filled 2014-11-18: qty 2

## 2014-11-18 MED ORDER — POTASSIUM CHLORIDE CRYS ER 20 MEQ PO TBCR
40.0000 meq | EXTENDED_RELEASE_TABLET | Freq: Once | ORAL | Status: AC
  Administered 2014-11-18: 40 meq via ORAL
  Filled 2014-11-18: qty 2

## 2014-11-18 MED ORDER — INSULIN DETEMIR 100 UNIT/ML ~~LOC~~ SOLN
15.0000 [IU] | SUBCUTANEOUS | Status: AC
  Administered 2014-11-18: 5 [IU] via SUBCUTANEOUS
  Filled 2014-11-18: qty 0.15

## 2014-11-18 MED ORDER — QUETIAPINE FUMARATE 100 MG PO TABS
100.0000 mg | ORAL_TABLET | Freq: Two times a day (BID) | ORAL | Status: DC
  Administered 2014-11-19 – 2014-11-22 (×7): 100 mg via ORAL
  Filled 2014-11-18 (×8): qty 1

## 2014-11-18 MED ORDER — DEXTROSE 50 % IV SOLN: 25.0000 mL | INTRAVENOUS | Status: DC | PRN

## 2014-11-18 NOTE — ED Notes (Signed)
Security at bedside

## 2014-11-18 NOTE — ED Notes (Signed)
Pt in room yelling and cursing at staff.

## 2014-11-18 NOTE — Progress Notes (Signed)
Was on the floor and was requested to see the patient ( Dr Antionette Poles) for ++ agitation, patient here for DKA, Schizophrenia, having auditory hallucinations, Haldol IM, requested Psych to see the patient now, on call S work for [facility] here to see.

## 2014-11-18 NOTE — ED Notes (Signed)
Patient transported to MRI 

## 2014-11-18 NOTE — H&P (Addendum)
[facility] History and Physical  [name] ZDG:[id number] DOB: 1986-05-07 DOA: 11/18/2014  Referring physician: ED physician PCP: Default, Provider, MD  Specialists:   Chief Complaint: Polydipsia and polyuria  HPI: [name] is a 28 y.o. male with past medical history diabetes type 1, depression, schizophrenia, who presents with polydipsia and polyuria.  Patient reports that he has type 1 diabetes. He has not been taking his medications for more than a week. He developed polyuria and polydipsia. He also feel confused and dizzy. He does not have fever, chills, shortness of breath, chest pain. No nausea, vomiting or abdominal pain. He does not have dysuria or burning on urination.  Patient also reports having chronic bilateral hip pain, he pointed to the bilateral groin areas. The pain is aggravated by medially rotating the legs. He said he had a steroid injection in the past with good relief.  Work up in the ED demonstrates elevated blood sugar level with anion gap of 18. UDS positive for THC, urinalysis negative for UTI, but with positive ketone. Patient is admitted to inpatient for further evaluation and treatment.  Review of Systems: As presented in the history of presenting illness, rest negative.  Where does patient live?  Shelter (pt is homeless) Can patient participate in ADLs? none  Allergy:  Allergies  Allergen Reactions  . Sulfa Antibiotics Other (See Comments)    Unknown - Childhood    Past Medical History  Diagnosis Date  . Diabetes mellitus   . Depression   . Schizo affective schizophrenia   . Polysubstance abuse   . Scoliosis   . Chronic pain   . Noncompliance with medication regimen     Past Surgical History  Procedure Laterality Date  . I&d extremity  12/19/2011    Procedure: IRRIGATION AND DEBRIDEMENT EXTREMITY;  Surgeon: Sharma Covert;  Location: [facility] OR;  Service: Orthopedics;  Laterality: Left;    Social History:  reports that he has  been smoking.  He has never used smokeless tobacco. He reports that he uses illicit drugs (Marijuana). He reports that he does not drink alcohol.  Family History:  Family History  Problem Relation Age of Onset  . Diabetes Mother      Prior to Admission medications   Medication Sig Start Date End Date Taking? Authorizing Provider  amphetamine-dextroamphetamine (ADDERALL) 20 MG tablet Take 2 tablets (40 mg total) by mouth 2 (two) times daily. 11/17/14   Gerhard Munch, MD  doxepin (SINEQUAN) 10 MG/ML solution Take 10 mLs (100 mg total) by mouth at bedtime. 11/17/14   Gerhard Munch, MD  insulin aspart (NOVOLOG) 100 UNIT/ML injection Inject 10 Units into the skin 3 (three) times daily with meals. Patient uses sliding scale. 11/17/14   Gerhard Munch, MD  insulin detemir (LEVEMIR) 100 UNIT/ML injection Inject 0.35 mLs (35 Units total) into the skin daily. 11/17/14   Gerhard Munch, MD    Physical Exam: Filed Vitals:   11/18/14 0330 11/18/14 0345 11/18/14 0400 11/18/14 0430  BP: 129/89 128/84 147/99 134/83  Pulse: 95 85 93 87  Temp:      TempSrc:      Resp: 11   14  Height:      Weight:      SpO2: 100% 99% 100% 99%   General: Not in acute distress. Dry mucus and the membrane. HEENT:       Eyes: PERRL, EOMI, no scleral icterus       ENT: No discharge from the ears and nose, no pharynx injection,  no tonsillar enlargement.        Neck: No JVD, no bruit, no mass felt. Cardiac: S1/S2, RRR, No murmurs, No gallops or rubs Pulm: Good air movement bilaterally. Clear to auscultation bilaterally. No rales, wheezing, rhonchi or rubs. Abd: Soft, nondistended, nontender, no rebound pain, no organomegaly, BS present Ext: No edema bilaterally. 2+DP/PT pulse bilaterally Musculoskeletal: tendernss in bilateral groin area. Skin: No rashes.  Neuro: Alert and oriented X3, cranial nerves II-XII grossly intact, muscle strength 5/5 in all extremeties, sensation to light touch intact. Brachial reflex 2+  bilaterally. Knee reflex 1+ bilaterally. Negative Babinski's sign. Normal finger to nose test. Psych: Patient is not psychotic, no suicidal or hemocidal ideation.  Labs on Admission:  Basic Metabolic Panel:  Recent Labs Lab 11/17/14 1319 11/17/14 1439 11/17/14 1446 11/18/14 0153  NA 135* 137 136* 139  K 3.5* 4.4 4.4 4.0  CL 94* 92* 96 94*  CO2  --  28  --  27  GLUCOSE 492* 433* 443* 504*  BUN 10 11 10 13   CREATININE 0.60 0.54 0.60 0.93  CALCIUM  --  9.7  --  9.8   Liver Function Tests:  Recent Labs Lab 11/17/14 1439  AST 14  ALT 14  ALKPHOS 74  BILITOT 0.4  PROT 7.3  ALBUMIN 4.8   No results for input(s): LIPASE, AMYLASE in the last 168 hours. No results for input(s): AMMONIA in the last 168 hours. CBC:  Recent Labs Lab 11/17/14 1319 11/17/14 1439 11/17/14 1446  WBC  --  8.3  --   HGB 16.0 16.4 16.3  HCT 47.0 44.6 48.0  MCV  --  87.3  --   PLT  --  297  --    Cardiac Enzymes: No results for input(s): CKTOTAL, CKMB, CKMBINDEX, TROPONINI in the last 168 hours.  BNP (last 3 results) No results for input(s): PROBNP in the last 8760 hours. CBG:  Recent Labs Lab 11/17/14 1438 11/18/14 0318 11/18/14 0439 11/18/14 0541  GLUCAP 427* 451* 254* 95    Radiological Exams on Admission: No results found.  EKG: Independently reviewed.   Assessment/Plan Principal Problem:   DKA (diabetic ketoacidoses) Active Problems:   Schizophrenia, undifferentiated   Generalized anxiety disorder   Diabetes type 1, uncontrolled   Hip pain, bilateral  Addendum: Repeated BMP at 5 AM showed closed anion gap. We'll DC DKA protocol, given basal insulin coverage of Levemir 10 units, start sliding scale insulin.  Change bed to med-surg.   DKA and DM-I: Anion Gap 18 and bicarbonate 27, indicating mild DKA. No source of infection identified. Most likely due to medication noncompliance. Currently hemodynamically stable. Clinically dehydrated.  - Admit to stepdown  - IVF: 2  L of NS bolus, followed by 150 cc/h - start DKA protocol with BMP q2h - NPO  -blood cultrue x 2  Psych issues: Including depression, anxiety, schizophrenia. No suicidal and homicidal ideations. -continue home doxepin and Adderall  Hip pain: Unclear etiology -X-ray of Hip  -tylenol for pain   DVT ppx: SQ Heparin     Code Status: Full code Family Communication: None at bed side.  Disposition Plan: Admit to inpatient   Date of Service 11/18/2014    14/01/2014 [facility] Pager [phone]  If 7PM-7AM, please contact night-coverage [email or web address] Password [facility] 11/18/2014, 5:49 AM

## 2014-11-18 NOTE — Progress Notes (Signed)
Tech came to me and said pt was dressing to leave. I go in and talk with pt. He states" you are killing me and taking my soul". Pt very anxious and states that someone on staff is harassing him but he doesn't know who. Stated that he was leaving within the hour and leaving the state of [place]. Pt was tearful while talking. Contacted Dr. Susie Cassette and she stated she would see him about 5:45. When I returned to room pt had been smoking. Once again the pt was informed that this is a smoke free facility.

## 2014-11-18 NOTE — Progress Notes (Signed)
  Arrived to unit 5w38 from ED. Denies nausea/ c/o left hip pain, instructed on usage of call system and room surroundings.

## 2014-11-18 NOTE — Progress Notes (Signed)
Hypoglycemic Event  CBG: 62  Treatment: 15 GM carbohydrate snack  Symptoms: None  Follow-up CBG: Time:0608 CBG Result:154  Possible Reasons for Event: Unknown  Comments/MD notified: Schorr, NP; Pt refused 15 min CBG recheck    [name]  Remember to initiate Hypoglycemia Order Set & complete

## 2014-11-18 NOTE — Progress Notes (Signed)
Team 1 notified of arrival to [phone]

## 2014-11-18 NOTE — ED Notes (Addendum)
Flow manager called in regards to pt bed placement, states he will be put on list for med surg bed

## 2014-11-18 NOTE — Progress Notes (Signed)
Patient ID: [name], male   DOB: Jul 28, 1986, 28 y.o.   MRN: [id number]  Dr. Susie Cassette and Dr. Thedore Mins called emergency psych consult for paranoid psychosis and combative behaviros. [name] has an exacerbation of his schizophrenia. He has been paranoid about every body is trying to her him, and also having some auditory hallucinations, and responding to internal stimuli.  He previously treated with seroquel, Prolixin celexa and sertraline. He has type 1 diabetes and presented with DKA to [facility]. Patient required haldol and klonopin to calm him down and will be placed on Involuntary commitment at this time due to increased psychosis and combative behaviors.   Start Seroquel 100 mg BID and discontinue adderall due to psychotic features Continue Haldol and klonopin Q6H/PRN for now  [facility] R. 11/18/2014 5:57 PM

## 2014-11-18 NOTE — Progress Notes (Signed)
Pt moved to 5w26(camera room). Pt remained asleep during transfer. Sitter remains at bedside

## 2014-11-18 NOTE — ED Notes (Signed)
Spoke to pharmacy again about Levemir, pharmacy reports it will be sent ASAP.

## 2014-11-18 NOTE — ED Notes (Addendum)
Spoke with pharmacy about pt's missing dose of Levemir. Pharmacy will be sending medication ASAP.

## 2014-11-18 NOTE — Progress Notes (Signed)
Pt sleeping, sitter at bedside

## 2014-11-18 NOTE — H&P (Addendum)
[facility] Progress note   [name] BTD:[id number] DOB: [birth year] DOA: 11/18/2014  Referring physician: ED physician PCP: Default, Provider, MD  Specialists:   Chief Complaint: Polydipsia and polyuria  HPI: [name] is a 28 y.o. male with past medical history diabetes type 1, depression, schizophrenia, who presents with polydipsia and polyuria.  Patient reports that he has type 1 diabetes. He has not been taking his medications for more than a week. He developed polyuria and polydipsia. He also feel confused and dizzy. He does not have fever, chills, shortness of breath, chest pain. No nausea, vomiting or abdominal pain. He does not have dysuria or burning on urination.  Patient also reports having chronic bilateral hip pain, he pointed to the bilateral groin areas. The pain is aggravated by medially rotating the legs. He said he had a steroid injection in the past with good relief.  Work up in the ED demonstrates elevated blood sugar level with anion gap of 18. UDS positive for THC, urinalysis negative for UTI, but with positive ketone. Patient is admitted to inpatient for further evaluation and treatment.     Subjective  Requesting pain medicine due to pain in left leg Hip x rays negative  Threatening to leave AMA Security by the bedside    ASSESSMENT AND PLAN  DKA and DM-I: Anion Gap 18 and bicarbonate 27, indicating mild DKA. No source of infection identified. Most likely due to medication noncompliance. Currently hemodynamically stable. Clinically dehydrated. Med surg bed  -blood cultrue x 2 Carb modified diet  levemir and SSI   Psych issues: Including depression, anxiety, schizophrenia. No suicidal and homicidal ideations. -continue home doxepin and Adderall  Hip pain: Unclear etiology -X-ray of Hip  toradol and tramadol for hip pain With hx of substance abuse ,avoid narcotics  D-dimer to r/o DVT , if abnl will check doppler B/L LE Left hip pain,  will do MRI left hip to r/o AVN ,  Pt states he is unable to bear weight   DVT ppx: SQ Heparin     Code Status: Full code Family Communication: None at bed side.  Disposition Plan: Admit to inpatient     Physical Exam:  General: Not in acute distress. Dry mucus and the membrane. HEENT:       Eyes: PERRL, EOMI, no scleral icterus       ENT: No discharge from the ears and nose, no pharynx injection, no tonsillar enlargement.        Neck: No JVD, no bruit, no mass felt. Cardiac: S1/S2, RRR, No murmurs, No gallops or rubs Pulm: Good air movement bilaterally. Clear to auscultation bilaterally. No rales, wheezing, rhonchi or rubs. Abd: Soft, nondistended, nontender, no rebound pain, no organomegaly, BS present Ext: No edema bilaterally. 2+DP/PT pulse bilaterally Musculoskeletal: tendernss in bilateral groin area. Skin: No rashes.  Neuro: Alert and oriented X3, cranial nerves II-XII grossly intact, muscle strength 5/5 in all extremeties, sensation to light touch intact. Brachial reflex 2+ bilaterally. Knee reflex 1+ bilaterally. Negative Babinski's sign. Normal finger to nose test. Psych: Patient is not psychotic, no suicidal or hemocidal ideation.  Labs on Admission:  Basic Metabolic Panel:  Recent Labs Lab 11/17/14 1319 11/17/14 1439 11/17/14 1446 11/18/14 0153 11/18/14 0512  NA 135* 137 136* 139 147  K 3.5* 4.4 4.4 4.0 3.4*  CL 94* 92* 96 94* 107  CO2  --  28  --  27 26  GLUCOSE 492* 433* 443* 504* 140*  BUN 10 11 10 13  13  CREATININE 0.60 0.54 0.60 0.93 0.61  CALCIUM  --  9.7  --  9.8 8.5   Liver Function Tests:  Recent Labs Lab 11/17/14 1439  AST 14  ALT 14  ALKPHOS 74  BILITOT 0.4  PROT 7.3  ALBUMIN 4.8   No results for input(s): LIPASE, AMYLASE in the last 168 hours. No results for input(s): AMMONIA in the last 168 hours. CBC:  Recent Labs Lab 11/17/14 1319 11/17/14 1439 11/17/14 1446  WBC  --  8.3  --   HGB 16.0 16.4 16.3  HCT 47.0 44.6 48.0   MCV  --  87.3  --   PLT  --  297  --    Cardiac Enzymes: No results for input(s): CKTOTAL, CKMB, CKMBINDEX, TROPONINI in the last 168 hours.  BNP (last 3 results) No results for input(s): PROBNP in the last 8760 hours. CBG:  Recent Labs Lab 11/18/14 0318 11/18/14 0439 11/18/14 0541 11/18/14 0634 11/18/14 0732  GLUCAP 451* 254* 95 107* 138*    Radiological Exams on Admission: Dg Hip Bilateral W/pelvis  11/18/2014   CLINICAL DATA:  Chronic bilateral groin pain for several months. Initial encounter.  EXAM: BILATERAL HIP WITH PELVIS - 4+ VIEW  COMPARISON:  None.  FINDINGS: There is no evidence of fracture or dislocation. Both femoral heads are seated normally within their respective acetabula. No significant degenerative change is appreciated. The sacroiliac joints are unremarkable in appearance.  The visualized bowel gas pattern is grossly unremarkable in appearance.  IMPRESSION: No evidence of fracture or dislocation.   Electronically Signed   By: Roanna Raider M.D.   On: 11/18/2014 05:51    EKG: Independently reviewed.   Assessment/Plan Principal Problem:   DKA (diabetic ketoacidoses) Active Problems:   Schizophrenia, undifferentiated   Generalized anxiety disorder   Diabetes type 1, uncontrolled   Hip pain, bilateral        Date of Service 11/18/2014    [facility] Pager [phone]  If 7PM-7AM, please contact night-coverage [email or web address] Password [facility] 11/18/2014, 9:28 AM

## 2014-11-18 NOTE — Progress Notes (Addendum)
[facility] Progress note   Sora Olivo WUJ:[id number] DOB: 30-Aug-1986 DOA: 11/18/2014  Referring physician: ED physician PCP: Default, Provider, MD  Specialists:   Chief Complaint: Polydipsia and polyuria  HPI: General Wearing is a 28 y.o. male with past medical history diabetes type 1, depression, schizophrenia, who presents with polydipsia and polyuria.  Patient reports that he has type 1 diabetes. He has not been taking his medications for more than a week. He developed polyuria and polydipsia. He also feel confused and dizzy. He does not have fever, chills, shortness of breath, chest pain. No nausea, vomiting or abdominal pain. He does not have dysuria or burning on urination.  Patient also reports having chronic bilateral hip pain, he pointed to the bilateral groin areas. The pain is aggravated by medially rotating the legs. He said he had a steroid injection in the past with good relief.  Work up in the ED demonstrates elevated blood sugar level with anion gap of 18. UDS positive for THC, urinalysis negative for UTI, but with positive ketone. Patient is admitted to inpatient for further evaluation and treatment.     Subjective  Requesting pain medicine due to pain in left leg Hip x rays negative  Threatening to leave AMA Security by the bedside    ASSESSMENT AND PLAN  DKA and DM-I: Anion Gap 18 and bicarbonate 27, indicating mild DKA. No source of infection identified. Most likely due to medication noncompliance. Currently hemodynamically stable. Clinically dehydrated. Med surg bed  -blood cultrue x 2 Carb modified diet  levemir and SSI   Psych issues: Including depression, anxiety, schizophrenia. No suicidal and homicidal ideations. -continue home doxepin and Adderall Patient probably now having a psychotic episode  Discussed with Dr Shela Commons , ordered zyprexa and klonopin,psyche to see him tomorrow   Hip pain: Unclear etiology -X-ray of Hip  toradol and tramadol  for hip pain With hx of substance abuse ,avoid narcotics  D-dimer to r/o DVT , if abnl will check doppler B/L LE Left hip pain, will do MRI left hip to r/o AVN ,  Pt states he is unable to bear weight   DVT ppx: SQ Heparin     Code Status: Full code Family Communication: None at bed side.  Disposition Plan: Admit to inpatient     Physical Exam:  General: Not in acute distress. Dry mucus and the membrane. HEENT:       Eyes: PERRL, EOMI, no scleral icterus       ENT: No discharge from the ears and nose, no pharynx injection, no tonsillar enlargement.        Neck: No JVD, no bruit, no mass felt. Cardiac: S1/S2, RRR, No murmurs, No gallops or rubs Pulm: Good air movement bilaterally. Clear to auscultation bilaterally. No rales, wheezing, rhonchi or rubs. Abd: Soft, nondistended, nontender, no rebound pain, no organomegaly, BS present Ext: No edema bilaterally. 2+DP/PT pulse bilaterally Musculoskeletal: tendernss in bilateral groin area. Skin: No rashes.  Neuro: Alert and oriented X3, cranial nerves II-XII grossly intact, muscle strength 5/5 in all extremeties, sensation to light touch intact. Brachial reflex 2+ bilaterally. Knee reflex 1+ bilaterally. Negative Babinski's sign. Normal finger to nose test. Psych: Patient is not psychotic, no suicidal or hemocidal ideation.  Labs on Admission:  Basic Metabolic Panel:  Recent Labs Lab 11/17/14 1319 11/17/14 1439 11/17/14 1446 11/18/14 0153 11/18/14 0512  NA 135* 137 136* 139 147  K 3.5* 4.4 4.4 4.0 3.4*  CL 94* 92* 96 94* 107  CO2  --  28  --  27 26  GLUCOSE 492* 433* 443* 504* 140*  BUN 10 11 10 13 13   CREATININE 0.60 0.54 0.60 0.93 0.61  CALCIUM  --  9.7  --  9.8 8.5   Liver Function Tests:  Recent Labs Lab 11/17/14 1439  AST 14  ALT 14  ALKPHOS 74  BILITOT 0.4  PROT 7.3  ALBUMIN 4.8   No results for input(s): LIPASE, AMYLASE in the last 168 hours. No results for input(s): AMMONIA in the last 168  hours. CBC:  Recent Labs Lab 11/17/14 1319 11/17/14 1439 11/17/14 1446  WBC  --  8.3  --   HGB 16.0 16.4 16.3  HCT 47.0 44.6 48.0  MCV  --  87.3  --   PLT  --  297  --    Cardiac Enzymes: No results for input(s): CKTOTAL, CKMB, CKMBINDEX, TROPONINI in the last 168 hours.  BNP (last 3 results) No results for input(s): PROBNP in the last 8760 hours. CBG:  Recent Labs Lab 11/18/14 0541 11/18/14 0634 11/18/14 0732 11/18/14 0919 11/18/14 1028  GLUCAP 95 107* 138* 159* 275*    Radiological Exams on Admission: Dg Hip Bilateral W/pelvis  11/18/2014   CLINICAL DATA:  Chronic bilateral groin pain for several months. Initial encounter.  EXAM: BILATERAL HIP WITH PELVIS - 4+ VIEW  COMPARISON:  None.  FINDINGS: There is no evidence of fracture or dislocation. Both femoral heads are seated normally within their respective acetabula. No significant degenerative change is appreciated. The sacroiliac joints are unremarkable in appearance.  The visualized bowel gas pattern is grossly unremarkable in appearance.  IMPRESSION: No evidence of fracture or dislocation.   Electronically Signed   By: 14/01/2014 M.D.   On: 11/18/2014 05:51   Mr Hip Left Wo Contrast  11/18/2014   CLINICAL DATA:  Bilateral hip pain.  EXAM: MR OF THE LEFT HIP WITHOUT CONTRAST  TECHNIQUE: Multiplanar, multisequence MR imaging was performed. No intravenous contrast was administered.  COMPARISON:  Radiographs dated 11/18/2014  FINDINGS: Bones: The osseous structures are normal.  Articular cartilage and labrum  Articular cartilage:  Normal.  Labrum:  Intact.  Joint or bursal effusion  Joint effusion:  None  Bursae:  Normal  Muscles and tendons  Muscles and tendons: The muscles and tendons around the left hip are normal. There is a focal area of abnormal edema in the right gluteus maximus muscle just posterior to the ischial tuberosity. This could be due to a recent injection or due to a partial tear of the muscle.  Other  findings  Miscellaneous:  There are no pelvic masses.  There is no adenopathy.  IMPRESSION: 1. Normal MRI of the left hip. 2. Abnormality of the right gluteus maximus muscle which could be due to an intramuscular injection or to a focal muscle tear.   Electronically Signed   By: 14/01/2014 M.D.   On: 11/18/2014 12:47    EKG: Independently reviewed.   Assessment/Plan Principal Problem:   DKA (diabetic ketoacidoses) Active Problems:   Schizophrenia, undifferentiated   Generalized anxiety disorder   Diabetes type 1, uncontrolled   Hip pain, bilateral        Date of Service 11/18/2014    [facility] Pager [phone]  If 7PM-7AM, please contact night-coverage [email or web address] Password [facility] 11/18/2014, 4:32 PM

## 2014-11-18 NOTE — ED Provider Notes (Signed)
CSN: [id number]     Arrival date & time 11/18/14  0126 History   This chart was scribed for [employer]. Rubin Payor, MD by Evon Slack, ED Scribe. This patient was seen in room A04C/A04C and the patient's care was started at 1:47 AM.      Chief Complaint  Patient presents with  . Hyperglycemia   Patient is a 28 y.o. male presenting with hyperglycemia. The history is provided by the patient. No language interpreter was used.  Hyperglycemia  HPI Comments: Level 5 Caveat: Psychiatric disorder [name] is a 28 y.o. male with PMHx of diabetes, Schizophrenia, and depression who presents to the Emergency Department complaining of hyperglycemia onset tonight. He states that he has associated urinary frequency. He states that he recently loss his medications due to his financial situation. Ems states that his cbg was 516 PTA.   Past Medical History  Diagnosis Date  . Diabetes mellitus   . Depression   . Schizo affective schizophrenia   . Polysubstance abuse   . Scoliosis   . Chronic pain   . Noncompliance with medication regimen    Past Surgical History  Procedure Laterality Date  . I&d extremity  12/19/2011    Procedure: IRRIGATION AND DEBRIDEMENT EXTREMITY;  Surgeon: Sharma Covert;  Location: [facility] OR;  Service: Orthopedics;  Laterality: Left;   No family history on file. History  Substance Use Topics  . Smoking status: Current Some Day Smoker -- 0.50 packs/day  . Smokeless tobacco: Never Used  . Alcohol Use: No    Review of Systems  Unable to perform ROS: Psychiatric disorder  Genitourinary: Positive for frequency.    Allergies  Sulfa antibiotics  Home Medications   Prior to Admission medications   Medication Sig Start Date End Date Taking? Authorizing Provider  amphetamine-dextroamphetamine (ADDERALL) 20 MG tablet Take 2 tablets (40 mg total) by mouth 2 (two) times daily. 11/17/14   Gerhard Munch, MD  doxepin (SINEQUAN) 10 MG/ML solution Take 10 mLs (100 mg total) by  mouth at bedtime. 11/17/14   Gerhard Munch, MD  insulin aspart (NOVOLOG) 100 UNIT/ML injection Inject 10 Units into the skin 3 (three) times daily with meals. Patient uses sliding scale. 11/17/14   Gerhard Munch, MD  insulin detemir (LEVEMIR) 100 UNIT/ML injection Inject 0.35 mLs (35 Units total) into the skin daily. 11/17/14   Gerhard Munch, MD   BP 127/88 mmHg  Pulse 97  Temp(Src) 98.2 F (36.8 C) (Oral)  Resp 22  Ht 6\' 1"  (1.854 m)  Wt 140 lb (63.504 kg)  BMI 18.47 kg/m2  SpO2 99%   Physical Exam  Constitutional: He appears well-developed.  HENT:  Head: Normocephalic.  Eyes: Pupils are equal, round, and reactive to light.  Cardiovascular: Normal rate.   Skin: Rash noted.  Some lesions bilateral hands. Scar on left thumb.  Psychiatric:  Somewhat strange affect    ED Course  Procedures (including critical care time) Labs Review Labs Reviewed  BASIC METABOLIC PANEL - Abnormal; Notable for the following:    Chloride 94 (*)    Glucose, Bld 504 (*)    Anion gap 18 (*)    All other components within normal limits  URINE RAPID DRUG SCREEN (HOSP PERFORMED) - Abnormal; Notable for the following:    Tetrahydrocannabinol POSITIVE (*)    All other components within normal limits  URINALYSIS, ROUTINE W REFLEX MICROSCOPIC - Abnormal; Notable for the following:    Specific Gravity, Urine 1.037 (*)    Glucose, UA >1000 (*)  Hgb urine dipstick TRACE (*)    Ketones, ur 40 (*)    All other components within normal limits  CBG MONITORING, ED - Abnormal; Notable for the following:    Glucose-Capillary 451 (*)    All other components within normal limits  URINE MICROSCOPIC-ADD ON    Imaging Review No results found.   EKG Interpretation None      MDM   Final diagnoses:  None   Patient with hyperglycemia. Apparent mild DKA. Has not taken his medications. Also is at the homeless shelter. Seen for same yesterday. Continues to be hyperglycemic and will admit to internal  medicine. Started on [employer]    I personally performed the services described in this documentation, which was scribed in my presence. The recorded information has been reviewed and is accurate.       Juliet Rude. Rubin Payor, MD 11/18/14 [phone]

## 2014-11-18 NOTE — ED Notes (Signed)
Per ems-- pt here yesterday for hyperglycemia. Pt is not taking medications. cbg 516.

## 2014-11-18 NOTE — Progress Notes (Signed)
Refused IVF

## 2014-11-18 NOTE — Progress Notes (Addendum)
After speaking to patient's nurse about his behavior, I went in to speak to the patient. He was calm at first then became tearful and at times belligerent. He was waiting for his doctor and agreed not to leave until speaking to the doctor first. He stated he was hearing voices in his head and felt that people were out to get him. He seemed very paranoid and depressed. I asked him if he had any intention on hurting himself and he answered no.  After Dr. Thedore Mins saw patient administered IM Haldol to help with his agitation and anxiety. We called for a sitter and patient was very upset by the sitter stating that he did not want another "spirit in his realm". He was very belligerent again with the sitter. Security had been called but never had to enter the patient's room.

## 2014-11-18 NOTE — ED Notes (Signed)
Pt reporting to security he will wait 15 minutes for stronger pain medicine or he will leave. Admitting doctor has been paged.

## 2014-11-18 NOTE — ED Notes (Signed)
Pt upset that he has no pain medicine ordered. I offered patient Tylenol that is ordered, pt refused. Will page doctor again to request more pain medicine.

## 2014-11-18 NOTE — Progress Notes (Signed)
Dr Singh into see pt

## 2014-11-18 NOTE — ED Notes (Addendum)
Dietary tray at bedside, pt eating and calmer and apologetic for behavior earlier.

## 2014-11-18 NOTE — ED Notes (Addendum)
Pt refusing medications at this time until he receives breakfast. Will contact admitting MD in regards to pt's diet order.

## 2014-11-19 DIAGNOSIS — E0810 Diabetes mellitus due to underlying condition with ketoacidosis without coma: Secondary | ICD-10-CM

## 2014-11-19 DIAGNOSIS — Z046 Encounter for general psychiatric examination, requested by authority: Secondary | ICD-10-CM

## 2014-11-19 LAB — COMPREHENSIVE METABOLIC PANEL
ALBUMIN: 3.4 g/dL — AB (ref 3.5–5.2)
ALK PHOS: 46 U/L (ref 39–117)
ALT: 12 U/L (ref 0–53)
ANION GAP: 12 (ref 5–15)
AST: 21 U/L (ref 0–37)
BUN: 10 mg/dL (ref 6–23)
CO2: 26 mEq/L (ref 19–32)
Calcium: 8.7 mg/dL (ref 8.4–10.5)
Chloride: 104 mEq/L (ref 96–112)
Creatinine, Ser: 0.47 mg/dL — ABNORMAL LOW (ref 0.50–1.35)
GFR calc Af Amer: >90 mL/min (ref >90)
GFR calc non Af Amer: >90 mL/min (ref >90)
Glucose, Bld: 104 mg/dL — ABNORMAL HIGH (ref 70–99)
POTASSIUM: 3.8 meq/L (ref 3.7–5.3)
SODIUM: 142 meq/L (ref 137–147)
TOTAL PROTEIN: 5.3 g/dL — AB (ref 6.0–8.3)
Total Bilirubin: <0.2 mg/dL — ABNORMAL LOW (ref 0.3–1.2)

## 2014-11-19 LAB — CBC
HCT: 38.9 % — ABNORMAL LOW (ref 39.0–52.0)
Hemoglobin: 13.8 g/dL (ref 13.0–17.0)
MCH: 30.9 pg (ref 26.0–34.0)
MCHC: 35.5 g/dL (ref 30.0–36.0)
MCV: 87.2 fL (ref 78.0–100.0)
PLATELETS: 209 10*3/uL (ref 150–400)
RBC: 4.46 MIL/uL (ref 4.22–5.81)
RDW: 11.8 % (ref 11.5–15.5)
WBC: 5.9 10*3/uL (ref 4.0–10.5)

## 2014-11-19 LAB — GLUCOSE, CAPILLARY
GLUCOSE-CAPILLARY: 154 mg/dL — AB (ref 70–99)
GLUCOSE-CAPILLARY: 113 mg/dL — AB (ref 70–99)
Glucose-Capillary: 371 mg/dL — ABNORMAL HIGH (ref 70–99)
Glucose-Capillary: 136 mg/dL — ABNORMAL HIGH (ref 70–99)
Glucose-Capillary: 237 mg/dL — ABNORMAL HIGH (ref 70–99)

## 2014-11-19 LAB — MRSA PCR SCREENING: MRSA BY PCR: NEGATIVE

## 2014-11-19 MED ORDER — CLONAZEPAM 1 MG PO TABS
1.0000 mg | ORAL_TABLET | Freq: Four times a day (QID) | ORAL | Status: DC | PRN
  Administered 2014-11-20 – 2014-11-22 (×5): 1 mg via ORAL
  Filled 2014-11-19 (×10): qty 1

## 2014-11-19 NOTE — Progress Notes (Signed)
Utilization review completed.  

## 2014-11-19 NOTE — Progress Notes (Signed)
Restraint was removed. Patient resting in bed. Alert, oriented, cooperating with nurse. Will continue to monitor.

## 2014-11-19 NOTE — Plan of Care (Signed)
Problem: Phase II Progression Outcomes Goal: CBGs stable on SQ insulin Outcome: Completed/Met Date Met:  11/19/14

## 2014-11-19 NOTE — Progress Notes (Signed)
Pt refused CBG  recheck. Will continue to monitor pt and try again.

## 2014-11-19 NOTE — Progress Notes (Signed)
Received Diabetes Coordinator consult. Noted that patient is being transferred to behavioral health.  Further recommendations can be made if needed after transfer.  Smith Mince RN BSN CDE

## 2014-11-19 NOTE — Progress Notes (Signed)
Patient uncooperative; is refusing to check CBG. MD notified.

## 2014-11-19 NOTE — Progress Notes (Signed)
Patient ID: [name], male   DOB: 21-Oct-1986, 28 y.o.   MRN: [id number]  Attempted to complete the psych evaluation and spoke with staff RN. Patient was sedated again this morning due to elopement and combative behaviors. Patient IVC petition is completed and will ask Primary MD to complete the first certificate. Patient will be followed later and Vickii Penna, LCSW is aware of the patient situation and been involved at this time.   [name],[name] 11/19/2014 2:07 PM

## 2014-11-19 NOTE — Clinical Social Work Psych Note (Signed)
Per report from evening CSW, patient has been involuntarily committed.  Paperwork on the chart.    Involuntary Commitment (IVC) Expiration: 11/25/2014  Vickii Penna, LCSWA [phone]  Psychiatric & Orthopedics (5N 1-16) Clinical Social Worker

## 2014-11-19 NOTE — Plan of Care (Signed)
Problem: Phase I Progression Outcomes Goal: Pain controlled with appropriate interventions Outcome: Completed/Met Date Met:  11/19/14 Goal: OOB as tolerated unless otherwise ordered Outcome: Completed/Met Date Met:  11/19/14 Goal: Voiding-avoid urinary catheter unless indicated Outcome: Completed/Met Date Met:  11/19/14 Goal: Other Phase I Outcomes/Goals Outcome: Not Applicable Date Met:  20/80/22  Problem: Phase II Progression Outcomes Goal: Nausea & vomiting resolved Outcome: Completed/Met Date Met:  11/19/14 Goal: Other Phase II Outcomes/Goals Outcome: Not Applicable Date Met:  33/61/22  Problem: Phase III Progression Outcomes Goal: Tolerating PO carb modified diet Outcome: Completed/Met Date Met:  11/19/14 Goal: Other Phase III Outcomes/Goals Outcome: Not Applicable Date Met:  44/97/53  Problem: Discharge Progression Outcomes Goal: Tolerating diet Outcome: Completed/Met Date Met:  11/19/14 Goal: Other Discharge Outcomes/Goals Outcome: Not Applicable Date Met:  00/51/10

## 2014-11-19 NOTE — Progress Notes (Signed)
[facility] PROGRESS NOTE  [name] WUJ:[id number] DOB: [birth year] DOA: 11/18/2014 PCP: Default, Provider, MD  Assessment/Plan:  DKA -likely due to medication noncompliance. 12/3- CBGs stable,  bicarbonate 26, AG 12.    -Levemir 15 units, SSI.  Continue IV hydration and NS 125 ml/hr -no identified source of infection: UA negative,  blood cultures with no growth.  Involuntairly Commitment -Patient with increased psychosis and combative behaviors. -Psychiatrist evaluate patient and patient is involuntary committed, exp 11-25-14 -Patient attempted to leave AMA,and due to combative behavior, initated soft restraints at arms and wrists.  Diabetes type 1, uncontrolled -patient has not taken his medication for over a week. -hemoglobin A1c 11.5 -continue carb modified diet.  Schizophrenia -patient with an episode of increased agitation and auditory hallucination requiring IM Haldol -Denies any SI or hallucinations.   -Appreciate Psych rec's- Pt will be IVC'd. Placed on Seroquiel 100mg  BID. Continue Haldol and Klonipin PRN.  Adderall d/c'ed due to psychotic features. -sitter at bedside  Bilateral hip pain -complains of hip pain worse with movement. -D-dimer negative -X-ray of hips no fracture/dislocation.  MRI of left hip- negative for acute abnormalities -Continue Toradol, tramadol when necessary.  Avoid narcotics. -will need outpatient follow-up for steroid injections.  Generalized anxiety disorder -Continue Doxepin 100mg  daily    DVT Prophylaxis SQ Heparin Code Status: Full Family Communication: No family at bedside Disposition Plan: Inpatient   Consultants:  Psychiatry  Procedures:  None  Antibiotics:  None  HPI/Subjective: [name] is a 28 yo Caucasian male with PMH of type 1 diabetes, depression, schizophrenia, that presents with polydipsia and polyuria.  Patient states he was not taking his diabetes medication for more than a week.  He  admits to feeling confused and dizzy.  He denies any fever, chills, chest pain, shortness of breath, nausea, vomiting, abdominal pain, or dysuria.  He also  Complains of bilateral hip pain.  He denies any injury to the area, and states he used to receive steroids injections, which helped with the pain.  In the ED CBGs were elevated with an iron Of 18, UDS positive for THC, UA with ketones.  Patient is admitted for further management.   States he wants to leave AMA if he does not get his personal computer. Declines CBG checks.   Objective: Filed Vitals:   11/18/14 2205  Pulse:   Temp:   Resp: 18   No intake or output data in the 24 hours ending 11/19/14 1228 Filed Weights   11/18/14 0323  Weight: 63.504 kg (140 lb)    Exam:  Gen: Alert Caucasian male in NAD. Sitting comfortably in bed  HEENT: Normocephalic, atraumatic.  Pupils symmertrical.  Moist mucosa. Bad dentition Chest: clear to auscultate bilaterally, no ronchi or rales  Cardiac: Regular rate and rhythm, S1-S2, no rubs murmurs or gallops  Abdomen: soft, non tender, non distended, +bowel sounds. No guarding or rigidity  Extremities: Symmetrical in appearance without cyanosis or edema  Neurological: Alert awake oriented to time place and person.  Psychiatric: Appears normal.   Data Reviewed: Basic Metabolic Panel:  Recent Labs Lab 11/17/14 1439 11/17/14 1446 11/18/14 0153 11/18/14 0512 11/19/14 0503  NA 137 136* 139 147 142  K 4.4 4.4 4.0 3.4* 3.8  CL 92* 96 94* 107 104  CO2 28  --  27 26 26   GLUCOSE 433* 443* 504* 140* 104*  BUN 11 10 13 13 10   CREATININE 0.54 0.60 0.93 0.61 0.47*  CALCIUM 9.7  --  9.8 8.5 8.7  Liver Function Tests:  Recent Labs Lab 11/17/14 1439 11/19/14 0503  AST 14 21  ALT 14 12  ALKPHOS 74 46  BILITOT 0.4 <0.2*  PROT 7.3 5.3*  ALBUMIN 4.8 3.4*   No results for input(s): LIPASE, AMYLASE in the last 168 hours. No results for input(s): AMMONIA in the last 168 hours. CBC:  Recent  Labs Lab 11/17/14 1319 11/17/14 1439 11/17/14 1446 11/19/14 0503  WBC  --  8.3  --  5.9  HGB 16.0 16.4 16.3 13.8  HCT 47.0 44.6 48.0 38.9*  MCV  --  87.3  --  87.2  PLT  --  297  --  209   Cardiac Enzymes: No results for input(s): CKTOTAL, CKMB, CKMBINDEX, TROPONINI in the last 168 hours. BNP (last 3 results) No results for input(s): PROBNP in the last 8760 hours. CBG:  Recent Labs Lab 11/18/14 0919 11/18/14 1028 11/18/14 1736 11/18/14 2206 11/19/14 0608  GLUCAP 159* 275* 184* 62* 154*    Recent Results (from the past 240 hour(s))  Culture, blood (routine x 2)     Status: None (Preliminary result)   Collection Time: 11/18/14  5:00 AM  Result Value Ref Range Status   Specimen Description BLOOD ARM LEFT  Final   Special Requests BOTTLES DRAWN AEROBIC AND ANAEROBIC 10CC  Final   Culture  Setup Time   Final    11/18/2014 08:15 Performed at [facility]    Culture   Final           BLOOD CULTURE RECEIVED NO GROWTH TO DATE CULTURE WILL BE HELD FOR 5 DAYS BEFORE ISSUING A FINAL NEGATIVE REPORT Performed at [facility]    Report Status PENDING  Incomplete  Culture, blood (routine x 2)     Status: None (Preliminary result)   Collection Time: 11/18/14  5:12 AM  Result Value Ref Range Status   Specimen Description BLOOD HAND LEFT  Final   Special Requests BOTTLES DRAWN AEROBIC ONLY 10CC  Final   Culture  Setup Time   Final    11/18/2014 08:15 Performed at [facility]    Culture   Final           BLOOD CULTURE RECEIVED NO GROWTH TO DATE CULTURE WILL BE HELD FOR 5 DAYS BEFORE ISSUING A FINAL NEGATIVE REPORT Performed at [facility]    Report Status PENDING  Incomplete  MRSA PCR Screening     Status: None   Collection Time: 11/19/14 12:36 AM  Result Value Ref Range Status   MRSA by PCR NEGATIVE NEGATIVE Final    Comment:        The GeneXpert MRSA Assay (FDA approved for NASAL specimens only), is one component of a comprehensive  MRSA colonization surveillance program. It is not intended to diagnose MRSA infection nor to guide or monitor treatment for MRSA infections.      Studies: Dg Hip Bilateral W/pelvis  11/18/2014   CLINICAL DATA:  Chronic bilateral groin pain for several months. Initial encounter.  EXAM: BILATERAL HIP WITH PELVIS - 4+ VIEW  COMPARISON:  None.  FINDINGS: There is no evidence of fracture or dislocation. Both femoral heads are seated normally within their respective acetabula. No significant degenerative change is appreciated. The sacroiliac joints are unremarkable in appearance.  The visualized bowel gas pattern is grossly unremarkable in appearance.  IMPRESSION: No evidence of fracture or dislocation.   Electronically Signed   By: Roanna Raider M.D.   On: 11/18/2014 05:51  Mr Hip Left Wo Contrast  11/18/2014   CLINICAL DATA:  Bilateral hip pain.  EXAM: MR OF THE LEFT HIP WITHOUT CONTRAST  TECHNIQUE: Multiplanar, multisequence MR imaging was performed. No intravenous contrast was administered.  COMPARISON:  Radiographs dated 11/18/2014  FINDINGS: Bones: The osseous structures are normal.  Articular cartilage and labrum  Articular cartilage:  Normal.  Labrum:  Intact.  Joint or bursal effusion  Joint effusion:  None  Bursae:  Normal  Muscles and tendons  Muscles and tendons: The muscles and tendons around the left hip are normal. There is a focal area of abnormal edema in the right gluteus maximus muscle just posterior to the ischial tuberosity. This could be due to a recent injection or due to a partial tear of the muscle.  Other findings  Miscellaneous:  There are no pelvic masses.  There is no adenopathy.  IMPRESSION: 1. Normal MRI of the left hip. 2. Abnormality of the right gluteus maximus muscle which could be due to an intramuscular injection or to a focal muscle tear.   Electronically Signed   By: Geanie Cooley M.D.   On: 11/18/2014 12:47    Scheduled Meds: . doxepin  100 mg Oral QHS  . heparin   5,000 Units Subcutaneous 3 times per day  . insulin aspart  0-5 Units Subcutaneous QHS  . insulin aspart  0-9 Units Subcutaneous TID WC  . insulin aspart  0-9 Units Subcutaneous TID WC  . insulin detemir  15 Units Subcutaneous Daily  . QUEtiapine  100 mg Oral BID   Continuous Infusions: . sodium chloride      Principal Problem:   DKA (diabetic ketoacidoses) Active Problems:   Schizophrenia, undifferentiated   Generalized anxiety disorder   Diabetes type 1, uncontrolled   Hip pain, bilateral    Time spent: 67    [name] [facility]  [facility] Pager [phone]. If 7PM-7AM, please contact night-coverage at [email or web address], password [facility] 11/19/2014, 12:28 PM  LOS: 1 day

## 2014-11-19 NOTE — Progress Notes (Addendum)
Patient sitting at the edge of the bed, was asking for his morning medication. Writer explained to him what kind of medicine he has scheduled for 10AM. He said he wants only Insulin and he refused Seroquel. He said if we are not giving what he wants he is living and he jumped out of bed and left the room. Clinical research associate and his sitter went after him and caught him near Engineer, structural. In the same time, sitter activated the alarm. We received more help but patient run to the emergency door. He went outside in the parking lot where we received help from security. Patient back in his room; per MD PRN order, he received Haldol IM with good effect. MD notified and initiated soft restraints at arms and wrists. At this time, patient is resting in his bed.  Will continue to monitor.

## 2014-11-20 DIAGNOSIS — R45851 Suicidal ideations: Secondary | ICD-10-CM

## 2014-11-20 DIAGNOSIS — F1919 Other psychoactive substance abuse with unspecified psychoactive substance-induced disorder: Secondary | ICD-10-CM

## 2014-11-20 DIAGNOSIS — F259 Schizoaffective disorder, unspecified: Secondary | ICD-10-CM

## 2014-11-20 DIAGNOSIS — R45850 Homicidal ideations: Secondary | ICD-10-CM

## 2014-11-20 LAB — BASIC METABOLIC PANEL
Anion gap: 9 (ref 5–15)
BUN: 9 mg/dL (ref 6–23)
CHLORIDE: 99 meq/L (ref 96–112)
CO2: 30 mEq/L (ref 19–32)
CREATININE: 0.58 mg/dL (ref 0.50–1.35)
Calcium: 8.9 mg/dL (ref 8.4–10.5)
GFR calc Af Amer: >90 mL/min (ref >90)
GFR calc non Af Amer: >90 mL/min (ref >90)
Glucose, Bld: 241 mg/dL — ABNORMAL HIGH (ref 70–99)
Potassium: 4 mEq/L (ref 3.7–5.3)
Sodium: 138 mEq/L (ref 137–147)

## 2014-11-20 LAB — GLUCOSE, CAPILLARY
GLUCOSE-CAPILLARY: 249 mg/dL — AB (ref 70–99)
GLUCOSE-CAPILLARY: 232 mg/dL — AB (ref 70–99)
Glucose-Capillary: 292 mg/dL — ABNORMAL HIGH (ref 70–99)
Glucose-Capillary: 187 mg/dL — ABNORMAL HIGH (ref 70–99)

## 2014-11-20 LAB — CBC
HCT: 39.6 % (ref 39.0–52.0)
Hemoglobin: 13.7 g/dL (ref 13.0–17.0)
MCH: 30 pg (ref 26.0–34.0)
MCHC: 34.6 g/dL (ref 30.0–36.0)
MCV: 86.7 fL (ref 78.0–100.0)
Platelets: 203 10*3/uL (ref 150–400)
RBC: 4.57 MIL/uL (ref 4.22–5.81)
RDW: 11.9 % (ref 11.5–15.5)
WBC: 4.6 10*3/uL (ref 4.0–10.5)

## 2014-11-20 MED ORDER — INSULIN DETEMIR 100 UNIT/ML ~~LOC~~ SOLN
18.0000 [IU] | Freq: Every day | SUBCUTANEOUS | Status: DC
Start: 2014-11-20 — End: 2014-11-20
  Administered 2014-11-20: 18 [IU] via SUBCUTANEOUS
  Filled 2014-11-20: qty 0.18

## 2014-11-20 MED ORDER — INSULIN DETEMIR 100 UNIT/ML ~~LOC~~ SOLN
22.0000 [IU] | Freq: Every day | SUBCUTANEOUS | Status: DC
  Filled 2014-11-20: qty 0.22

## 2014-11-20 MED ORDER — TRAMADOL HCL 50 MG PO TABS
100.0000 mg | ORAL_TABLET | Freq: Once | ORAL | Status: AC
  Administered 2014-11-20 (×2): 100 mg via ORAL

## 2014-11-20 MED ORDER — INSULIN DETEMIR 100 UNIT/ML ~~LOC~~ SOLN
4.0000 [IU] | Freq: Once | SUBCUTANEOUS | Status: AC
  Administered 2014-11-20: 4 [IU] via SUBCUTANEOUS
  Filled 2014-11-20: qty 0.04

## 2014-11-20 MED ORDER — TRAMADOL HCL 50 MG PO TABS
100.0000 mg | ORAL_TABLET | Freq: Four times a day (QID) | ORAL | Status: DC | PRN
  Administered 2014-11-20 – 2014-11-22 (×5): 100 mg via ORAL
  Filled 2014-11-20 (×5): qty 2

## 2014-11-20 NOTE — Progress Notes (Signed)
[facility] PROGRESS NOTE  [name] VZC:[id number] DOB: [birth year] DOA: 11/18/2014 PCP: Default, Provider, MD  Assessment/Plan: #1 DKA/poorly controlled type 1 diabetes. Secondary to medical noncompliance. Anion gap is closed. Hemoglobin A1c is 11.5. CBGs have ranged from 136 -249. Increase Levemir to 22 units daily. Continue sliding scale insulin. Follow CBGs and titrate as needed. Outpatient diabetes follow-up.  #2 schizophrenia with psychosis Patient with agitation and belligerent behavior. Patient with auditory hallucinations. Patient denies any suicidal ideation. Patient denies any homicidal ideation. Patient is currently on the IVC and needs inpatient psychiatric treatment. Continue current regimen of Seroquel 100 mg twice a day. Haldol and Klonopin when necessary.  #3 bilateral hip pain Patient with complaints of hip pain. X-rays negative for fracture or dislocation. MRI of the left hip negative for any acute abnormalities. Continue pain management. Try to avoid narcotics. Outpatient follow-up.  #4 generalized anxiety disorder Continue doxepin. Psychiatric follow-up.  #5 prophylaxis Heparin for DVT prophylaxis.  Code Status: Full Family Communication: Updated patient and family present. Disposition Plan: To inpatient psych when bed available.   Consultants:  Psychiatry: Dr. Elsie Saas 11/19/2014  Procedures:  X-ray of bilateral hips 11/18/2014  MRI of left hip 11/18/2014  Antibiotics:  None  HPI/Subjective: Patient was still complaints of auditory hallucinations. Patient denies any homicidal ideation. Patient denies any suicidal ideation. Patient is using profanity with the nurses. Patient asking for double portions of food.  Objective: Filed Vitals:   11/20/14 0704  BP: 106/76  Pulse: 96  Temp: 97.6 F (36.4 C)  Resp: 18   No intake or output data in the 24 hours ending 11/20/14 1039 Filed Weights   11/18/14 0323  Weight: 63.504 kg (140  lb)    Exam:   General:  NAD  Cardiovascular: RRR  Respiratory: CTAB  Abdomen: Soft, nontender, nondistended, positive bowel sounds.  Musculoskeletal: No clubbing cyanosis or edema.  Data Reviewed: Basic Metabolic Panel:  Recent Labs Lab 11/17/14 1439 11/17/14 1446 11/18/14 0153 11/18/14 0512 11/19/14 0503 11/20/14 0641  NA 137 136* 139 147 142 138  K 4.4 4.4 4.0 3.4* 3.8 4.0  CL 92* 96 94* 107 104 99  CO2 28  --  27 26 26 30   GLUCOSE 433* 443* 504* 140* 104* 241*  BUN 11 10 13 13 10 9   CREATININE 0.54 0.60 0.93 0.61 0.47* 0.58  CALCIUM 9.7  --  9.8 8.5 8.7 8.9   Liver Function Tests:  Recent Labs Lab 11/17/14 1439 11/19/14 0503  AST 14 21  ALT 14 12  ALKPHOS 74 46  BILITOT 0.4 <0.2*  PROT 7.3 5.3*  ALBUMIN 4.8 3.4*   No results for input(s): LIPASE, AMYLASE in the last 168 hours. No results for input(s): AMMONIA in the last 168 hours. CBC:  Recent Labs Lab 11/17/14 1319 11/17/14 1439 11/17/14 1446 11/19/14 0503 11/20/14 0641  WBC  --  8.3  --  5.9 4.6  HGB 16.0 16.4 16.3 13.8 13.7  HCT 47.0 44.6 48.0 38.9* 39.6  MCV  --  87.3  --  87.2 86.7  PLT  --  297  --  209 203   Cardiac Enzymes: No results for input(s): CKTOTAL, CKMB, CKMBINDEX, TROPONINI in the last 168 hours. BNP (last 3 results) No results for input(s): PROBNP in the last 8760 hours. CBG:  Recent Labs Lab 11/19/14 1235 11/19/14 1649 11/19/14 2102 11/19/14 2224 11/20/14 0743  GLUCAP 371* 113* 136* 237* 249*    Recent Results (from the past 240 hour(s))  Culture, blood (  routine x 2)     Status: None (Preliminary result)   Collection Time: 11/18/14  5:00 AM  Result Value Ref Range Status   Specimen Description BLOOD ARM LEFT  Final   Special Requests BOTTLES DRAWN AEROBIC AND ANAEROBIC 10CC  Final   Culture  Setup Time   Final    11/18/2014 08:15 Performed at [facility]    Culture   Final           BLOOD CULTURE RECEIVED NO GROWTH TO DATE CULTURE WILL BE  HELD FOR 5 DAYS BEFORE ISSUING A FINAL NEGATIVE REPORT Performed at [facility]    Report Status PENDING  Incomplete  Culture, blood (routine x 2)     Status: None (Preliminary result)   Collection Time: 11/18/14  5:12 AM  Result Value Ref Range Status   Specimen Description BLOOD HAND LEFT  Final   Special Requests BOTTLES DRAWN AEROBIC ONLY 10CC  Final   Culture  Setup Time   Final    11/18/2014 08:15 Performed at [facility]    Culture   Final           BLOOD CULTURE RECEIVED NO GROWTH TO DATE CULTURE WILL BE HELD FOR 5 DAYS BEFORE ISSUING A FINAL NEGATIVE REPORT Performed at [facility]    Report Status PENDING  Incomplete  MRSA PCR Screening     Status: None   Collection Time: 11/19/14 12:36 AM  Result Value Ref Range Status   MRSA by PCR NEGATIVE NEGATIVE Final    Comment:        The GeneXpert MRSA Assay (FDA approved for NASAL specimens only), is one component of a comprehensive MRSA colonization surveillance program. It is not intended to diagnose MRSA infection nor to guide or monitor treatment for MRSA infections.      Studies: Mr Hip Left Wo Contrast  11/18/2014   CLINICAL DATA:  Bilateral hip pain.  EXAM: MR OF THE LEFT HIP WITHOUT CONTRAST  TECHNIQUE: Multiplanar, multisequence MR imaging was performed. No intravenous contrast was administered.  COMPARISON:  Radiographs dated 11/18/2014  FINDINGS: Bones: The osseous structures are normal.  Articular cartilage and labrum  Articular cartilage:  Normal.  Labrum:  Intact.  Joint or bursal effusion  Joint effusion:  None  Bursae:  Normal  Muscles and tendons  Muscles and tendons: The muscles and tendons around the left hip are normal. There is a focal area of abnormal edema in the right gluteus maximus muscle just posterior to the ischial tuberosity. This could be due to a recent injection or due to a partial tear of the muscle.  Other findings  Miscellaneous:  There are no pelvic masses.  There  is no adenopathy.  IMPRESSION: 1. Normal MRI of the left hip. 2. Abnormality of the right gluteus maximus muscle which could be due to an intramuscular injection or to a focal muscle tear.   Electronically Signed   By: Geanie Cooley M.D.   On: 11/18/2014 12:47    Scheduled Meds: . doxepin  100 mg Oral QHS  . heparin  5,000 Units Subcutaneous 3 times per day  . insulin aspart  0-5 Units Subcutaneous QHS  . insulin aspart  0-9 Units Subcutaneous TID WC  . insulin aspart  0-9 Units Subcutaneous TID WC  . insulin detemir  18 Units Subcutaneous Daily  . QUEtiapine  100 mg Oral BID  . traMADol  100 mg Oral Once   Continuous Infusions:   Principal  Problem:   DKA (diabetic ketoacidoses) Active Problems:   Schizophrenia, undifferentiated   Generalized anxiety disorder   Diabetes type 1, uncontrolled   Hip pain, bilateral   Involuntary commitment    Time spent: 40 mins    [facility] MD [facility] Pager [phone]. If 7PM-7AM, please contact night-coverage at [email or web address], password [facility] 11/20/2014, 10:39 AM  LOS: 2 days

## 2014-11-20 NOTE — Consult Note (Signed)
[facility] Face-to-Face Psychiatry Consult   Reason for Consult:  Psychosis and agitation Referring Physician:  Dr. [name] is an 28 y.o. male. Total Time spent with patient: 45 minutes  Assessment: AXIS I:  Schizoaffective Disorder and Substance Abuse AXIS II:  Cluster A Traits AXIS III:   Past Medical History  Diagnosis Date  . Diabetes mellitus   . Depression   . Schizo affective schizophrenia   . Polysubstance abuse   . Scoliosis   . Chronic pain   . Noncompliance with medication regimen   . DKA (diabetic ketoacidoses) 11/18/2014  . GERD (gastroesophageal reflux disease)    AXIS IV:  economic problems, occupational problems, other psychosocial or environmental problems, problems related to social environment and problems with primary support group AXIS V:  41-50 serious symptoms  Plan:  Case discussed with the social service and [facility] from [place] Patient was placed on involuntary commitment We'll continue his current medication management including when necessary Haldol and Ativan for agitation and aggressive behaviors Patient was previously taken [place] and Prophenazine  Recommend psychiatric Inpatient admission when medically cleared. Supportive therapy provided about ongoing stressors.  Appreciate psychiatric consultation Please contact [phone] or [phone] if needs further assistance   Subjective:   [name] is a 28 y.o. male patient admitted with Psychosis and agitation.  HPI: [name] is a 28 y.o. male seen for the last 2 days and finally able to complete the assessment today. Patient reported he has been diagnosed with a schizoaffective disorder and also smoked marijuana came to the [facility] with a diabetic ketoacidosis. Patient has been noncompliant with the antipsychotic medications over few months and continue to have delusional thoughts, paranoid ideation and disorganized behaviors. Patient has stated people are  playing with his mind and talking to him bad stuff and he feels like jumping out of the window and trying to walk away from the hospital without proper procedure. The patient was stopped by the staff he become combative which required chemical sedation and physical restraints to prevent danger to himself under others. Yesterday morning he tried to run away from the hospital when he was offered medication and then started combative, yelling screaming and trying to throw his hand to her staff members and made threatening to hurt them on their families and required soft restraints and chemical sedation again. Patient has been off of his restraints, calm and cooperative during this evaluation but continued to be resistant with psychiatric medication management and does not want to be admitted to inpatient psychiatric hospitalization. Patient continued to show poor insight, judgment and impulse control. Patient reportedly came into the [place] about 4 days ago from [place]. Patient also reportedly falling around [place] and [place]. Spoke with the patient mother who reported recent has been disabled and receiving Social Security and misusing his money and continue to leave places and becoming homeless. Patient mother believes patient need to be may psychiatric placement when he can be stabilized. Patient urine drug screen is positive for tetrahydrocannabinol but not amphetamine even though he reports taking Adderall from a doctor in [place]. Patient does not have a outpatient psychiatric services in [place] at this time. Patient is willing to follow up with the family service of [place] upon discharged.  Medical history: Patient with past medical history diabetes type 1, depression, schizophrenia, who presents with polydipsia and polyuria. Patient reports that he has type 1 diabetes. He has not been taking his medications for more than a  week. He developed polyuria and polydipsia. He also feel confused and dizzy. He  does not have fever, chills, shortness of breath, chest pain. No nausea, vomiting or abdominal pain. He does not have dysuria or burning on urination. Patient also reports having chronic bilateral hip pain, he pointed to the bilateral groin areas. The pain is aggravated by medially rotating the legs. He said he had a steroid injection in the past with good relief. Work up in the ED demonstrates elevated blood sugar level with anion gap of 18. UDS positive for THC, urinalysis negative for UTI, but with positive ketone. Patient is admitted to inpatient for further evaluation and treatment.  HPI Elements:  Location:  Paranoid delusions and disorganized thoughts. Quality:  Agitation and combative behavior. Severity:  Noncompliant with his treatment. Timing:  Admitted for diabetic ketoacidosis.  Past Psychiatric History: Past Medical History  Diagnosis Date  . Diabetes mellitus   . Depression   . Schizo affective schizophrenia   . Polysubstance abuse   . Scoliosis   . Chronic pain   . Noncompliance with medication regimen   . DKA (diabetic ketoacidoses) 11/18/2014  . GERD (gastroesophageal reflux disease)     reports that he has been smoking.  He has never used smokeless tobacco. He reports that he drinks alcohol. He reports that he uses illicit drugs (Marijuana). Family History  Problem Relation Age of Onset  . Diabetes Mother      Living Arrangements: Other (Comment)   Abuse/Neglect [facility]) Physical Abuse: Denies Verbal Abuse: Denies Sexual Abuse: Denies Allergies:   Allergies  Allergen Reactions  . Sulfa Antibiotics Other (See Comments)    Unknown - Childhood    ACT Assessment Complete:  No Objective: Blood pressure 106/76, pulse 96, temperature 97.6 F (36.4 C), temperature source Oral, resp. rate 18, height $RemoveBe'6\' 1"'RWvsYTCEW$  (1.854 m), weight 63.504 kg (140 lb), SpO2 96 %.Body mass index is 18.47 kg/(m^2). Results for orders placed or performed during the hospital encounter of 11/18/14  (from the past 72 hour(s))  Basic metabolic panel     Status: Abnormal   Collection Time: 11/18/14  1:53 AM  Result Value Ref Range   Sodium 139 137 - 147 mEq/L   Potassium 4.0 3.7 - 5.3 mEq/L   Chloride 94 (L) 96 - 112 mEq/L   CO2 27 19 - 32 mEq/L   Glucose, Bld 504 (H) 70 - 99 mg/dL   BUN 13 6 - 23 mg/dL   Creatinine, Ser 0.93 0.50 - 1.35 mg/dL    Comment: DELTA CHECK NOTED   Calcium 9.8 8.4 - 10.5 mg/dL   GFR calc non Af Amer >90 >90 mL/min   GFR calc Af Amer >90 >90 mL/min    Comment: (NOTE) The eGFR has been calculated using the CKD EPI equation. This calculation has not been validated in all clinical situations. eGFR's persistently <90 mL/min signify possible Chronic Kidney Disease.    Anion gap 18 (H) 5 - 15  Drug screen panel, emergency     Status: Abnormal   Collection Time: 11/18/14  2:48 AM  Result Value Ref Range   Opiates NONE DETECTED NONE DETECTED   Cocaine NONE DETECTED NONE DETECTED   Benzodiazepines NONE DETECTED NONE DETECTED   Amphetamines NONE DETECTED NONE DETECTED   Tetrahydrocannabinol POSITIVE (A) NONE DETECTED   Barbiturates NONE DETECTED NONE DETECTED    Comment:        DRUG SCREEN FOR MEDICAL PURPOSES ONLY.  IF CONFIRMATION IS NEEDED FOR ANY PURPOSE, NOTIFY LAB  WITHIN 5 DAYS.        LOWEST DETECTABLE LIMITS FOR URINE DRUG SCREEN Drug Class       Cutoff (ng/mL) Amphetamine      1000 Barbiturate      200 Benzodiazepine   469 Tricyclics       629 Opiates          300 Cocaine          300 THC              50   Urinalysis, Routine w reflex microscopic     Status: Abnormal   Collection Time: 11/18/14  2:48 AM  Result Value Ref Range   Color, Urine YELLOW YELLOW   APPearance CLEAR CLEAR   Specific Gravity, Urine 1.037 (H) 1.005 - 1.030   pH 6.5 5.0 - 8.0   Glucose, UA >1000 (A) NEGATIVE mg/dL   Hgb urine dipstick TRACE (A) NEGATIVE   Bilirubin Urine NEGATIVE NEGATIVE   Ketones, ur 40 (A) NEGATIVE mg/dL   Protein, ur NEGATIVE NEGATIVE  mg/dL   Urobilinogen, UA 0.2 0.0 - 1.0 mg/dL   Nitrite NEGATIVE NEGATIVE   Leukocytes, UA NEGATIVE NEGATIVE  Urine microscopic-add on     Status: None   Collection Time: 11/18/14  2:48 AM  Result Value Ref Range   Squamous Epithelial / LPF RARE RARE   RBC / HPF 3-6 <3 RBC/hpf   Bacteria, UA RARE RARE  CBG monitoring, ED     Status: Abnormal   Collection Time: 11/18/14  3:18 AM  Result Value Ref Range   Glucose-Capillary 451 (H) 70 - 99 mg/dL  CBG monitoring, ED     Status: Abnormal   Collection Time: 11/18/14  4:39 AM  Result Value Ref Range   Glucose-Capillary 254 (H) 70 - 99 mg/dL  Culture, blood (routine x 2)     Status: None (Preliminary result)   Collection Time: 11/18/14  5:00 AM  Result Value Ref Range   Specimen Description BLOOD ARM LEFT    Special Requests BOTTLES DRAWN AEROBIC AND ANAEROBIC 10CC    Culture  Setup Time      11/18/2014 08:15 Performed at [facility] NO GROWTH TO DATE CULTURE WILL BE HELD FOR 5 DAYS BEFORE ISSUING A FINAL NEGATIVE REPORT Performed at [facility]    Report Status PENDING   Culture, blood (routine x 2)     Status: None (Preliminary result)   Collection Time: 11/18/14  5:12 AM  Result Value Ref Range   Specimen Description BLOOD HAND LEFT    Special Requests BOTTLES DRAWN AEROBIC ONLY 10CC    Culture  Setup Time      11/18/2014 08:15 Performed at [facility] TO DATE CULTURE WILL BE HELD FOR 5 DAYS BEFORE ISSUING A FINAL NEGATIVE REPORT Performed at [facility]    Report Status PENDING   Basic metabolic panel (stat then every 4 hours)     Status: Abnormal   Collection Time: 11/18/14  5:12 AM  Result Value Ref Range   Sodium 147 137 - 147 mEq/L    Comment: DELTA CHECK NOTED   Potassium 3.4 (L) 3.7 - 5.3 mEq/L   Chloride 107 96 - 112 mEq/L   CO2 26 19 -  32 mEq/L   Glucose, Bld 140 (H) 70 - 99  mg/dL   BUN 13 6 - 23 mg/dL   Creatinine, Ser 0.61 0.50 - 1.35 mg/dL    Comment: DELTA CHECK NOTED   Calcium 8.5 8.4 - 10.5 mg/dL   GFR calc non Af Amer >90 >90 mL/min   GFR calc Af Amer >90 >90 mL/min    Comment: (NOTE) The eGFR has been calculated using the CKD EPI equation. This calculation has not been validated in all clinical situations. eGFR's persistently <90 mL/min signify possible Chronic Kidney Disease.    Anion gap 14 5 - 15  CBG monitoring, ED     Status: None   Collection Time: 11/18/14  5:41 AM  Result Value Ref Range   Glucose-Capillary 95 70 - 99 mg/dL  CBG monitoring, ED     Status: Abnormal   Collection Time: 11/18/14  6:34 AM  Result Value Ref Range   Glucose-Capillary 107 (H) 70 - 99 mg/dL  CBG monitoring, ED     Status: Abnormal   Collection Time: 11/18/14  7:32 AM  Result Value Ref Range   Glucose-Capillary 138 (H) 70 - 99 mg/dL  Hemoglobin A1c     Status: Abnormal   Collection Time: 11/18/14  8:27 AM  Result Value Ref Range   Hgb A1c MFr Bld 11.5 (H) <5.7 %    Comment: (NOTE)                                                                       According to the ADA Clinical Practice Recommendations for 2011, when HbA1c is used as a screening test:  >=6.5%   Diagnostic of Diabetes Mellitus           (if abnormal result is confirmed) 5.7-6.4%   Increased risk of developing Diabetes Mellitus References:Diagnosis and Classification of Diabetes Mellitus,Diabetes WUJW,1191,47(WGNFA 1):S62-S69 and Standards of Medical Care in         Diabetes - 2011,Diabetes Care,2011,34 (Suppl 1):S11-S61.    Mean Plasma Glucose 283 (H) <117 mg/dL    Comment: Performed at [facility]  CBG monitoring, ED     Status: Abnormal   Collection Time: 11/18/14  9:19 AM  Result Value Ref Range   Glucose-Capillary 159 (H) 70 - 99 mg/dL  CBG monitoring, ED     Status: Abnormal   Collection Time: 11/18/14 10:28 AM  Result Value Ref Range   Glucose-Capillary 275 (H) 70 -  99 mg/dL  D-dimer, quantitative     Status: None   Collection Time: 11/18/14 12:39 PM  Result Value Ref Range   D-Dimer, Quant <0.27 0.00 - 0.48 ug/mL-FEU    Comment:        AT THE INHOUSE ESTABLISHED CUTOFF VALUE OF 0.48 ug/mL FEU, THIS ASSAY HAS BEEN DOCUMENTED IN THE LITERATURE TO HAVE A SENSITIVITY AND NEGATIVE PREDICTIVE VALUE OF AT LEAST 98 TO 99%.  THE TEST RESULT SHOULD BE CORRELATED WITH AN ASSESSMENT OF THE CLINICAL PROBABILITY OF DVT / VTE. SPECIMEN CHECKED FOR CLOTS   Uric acid     Status: Abnormal   Collection Time: 11/18/14 12:39 PM  Result Value Ref Range   Uric Acid, Serum 3.6 (L) 4.0 - 7.8 mg/dL  Glucose, capillary  Status: Abnormal   Collection Time: 11/18/14  5:36 PM  Result Value Ref Range   Glucose-Capillary 184 (H) 70 - 99 mg/dL  Glucose, capillary     Status: Abnormal   Collection Time: 11/18/14 10:06 PM  Result Value Ref Range   Glucose-Capillary 62 (L) 70 - 99 mg/dL  MRSA PCR Screening     Status: None   Collection Time: 11/19/14 12:36 AM  Result Value Ref Range   MRSA by PCR NEGATIVE NEGATIVE    Comment:        The GeneXpert MRSA Assay (FDA approved for NASAL specimens only), is one component of a comprehensive MRSA colonization surveillance program. It is not intended to diagnose MRSA infection nor to guide or monitor treatment for MRSA infections.   CBC     Status: Abnormal   Collection Time: 11/19/14  5:03 AM  Result Value Ref Range   WBC 5.9 4.0 - 10.5 K/uL   RBC 4.46 4.22 - 5.81 MIL/uL   Hemoglobin 13.8 13.0 - 17.0 g/dL   HCT 38.9 (L) 39.0 - 52.0 %   MCV 87.2 78.0 - 100.0 fL   MCH 30.9 26.0 - 34.0 pg   MCHC 35.5 30.0 - 36.0 g/dL   RDW 11.8 11.5 - 15.5 %   Platelets 209 150 - 400 K/uL    Comment: REPEATED TO VERIFY SPECIMEN CHECKED FOR CLOTS   Comprehensive metabolic panel     Status: Abnormal   Collection Time: 11/19/14  5:03 AM  Result Value Ref Range   Sodium 142 137 - 147 mEq/L   Potassium 3.8 3.7 - 5.3 mEq/L    Chloride 104 96 - 112 mEq/L   CO2 26 19 - 32 mEq/L   Glucose, Bld 104 (H) 70 - 99 mg/dL   BUN 10 6 - 23 mg/dL   Creatinine, Ser 0.47 (L) 0.50 - 1.35 mg/dL   Calcium 8.7 8.4 - 10.5 mg/dL   Total Protein 5.3 (L) 6.0 - 8.3 g/dL   Albumin 3.4 (L) 3.5 - 5.2 g/dL   AST 21 0 - 37 U/L    Comment: HEMOLYSIS AT THIS LEVEL MAY AFFECT RESULT   ALT 12 0 - 53 U/L   Alkaline Phosphatase 46 39 - 117 U/L   Total Bilirubin <0.2 (L) 0.3 - 1.2 mg/dL   GFR calc non Af Amer >90 >90 mL/min   GFR calc Af Amer >90 >90 mL/min    Comment: (NOTE) The eGFR has been calculated using the CKD EPI equation. This calculation has not been validated in all clinical situations. eGFR's persistently <90 mL/min signify possible Chronic Kidney Disease.    Anion gap 12 5 - 15  Glucose, capillary     Status: Abnormal   Collection Time: 11/19/14  6:08 AM  Result Value Ref Range   Glucose-Capillary 154 (H) 70 - 99 mg/dL  Glucose, capillary     Status: Abnormal   Collection Time: 11/19/14 12:35 PM  Result Value Ref Range   Glucose-Capillary 371 (H) 70 - 99 mg/dL  Glucose, capillary     Status: Abnormal   Collection Time: 11/19/14  4:49 PM  Result Value Ref Range   Glucose-Capillary 113 (H) 70 - 99 mg/dL  Glucose, capillary     Status: Abnormal   Collection Time: 11/19/14  9:02 PM  Result Value Ref Range   Glucose-Capillary 136 (H) 70 - 99 mg/dL  Glucose, capillary     Status: Abnormal   Collection Time: 11/19/14 10:24 PM  Result Value Ref  Range   Glucose-Capillary 237 (H) 70 - 99 mg/dL  CBC     Status: None   Collection Time: 11/20/14  6:41 AM  Result Value Ref Range   WBC 4.6 4.0 - 10.5 K/uL   RBC 4.57 4.22 - 5.81 MIL/uL   Hemoglobin 13.7 13.0 - 17.0 g/dL   HCT 39.6 39.0 - 52.0 %   MCV 86.7 78.0 - 100.0 fL   MCH 30.0 26.0 - 34.0 pg   MCHC 34.6 30.0 - 36.0 g/dL   RDW 11.9 11.5 - 15.5 %   Platelets 203 150 - 400 K/uL  Basic metabolic panel     Status: Abnormal   Collection Time: 11/20/14  6:41 AM  Result  Value Ref Range   Sodium 138 137 - 147 mEq/L   Potassium 4.0 3.7 - 5.3 mEq/L   Chloride 99 96 - 112 mEq/L   CO2 30 19 - 32 mEq/L   Glucose, Bld 241 (H) 70 - 99 mg/dL   BUN 9 6 - 23 mg/dL   Creatinine, Ser 0.58 0.50 - 1.35 mg/dL   Calcium 8.9 8.4 - 10.5 mg/dL   GFR calc non Af Amer >90 >90 mL/min   GFR calc Af Amer >90 >90 mL/min    Comment: (NOTE) The eGFR has been calculated using the CKD EPI equation. This calculation has not been validated in all clinical situations. eGFR's persistently <90 mL/min signify possible Chronic Kidney Disease.    Anion gap 9 5 - 15  Glucose, capillary     Status: Abnormal   Collection Time: 11/20/14  7:43 AM  Result Value Ref Range   Glucose-Capillary 249 (H) 70 - 99 mg/dL   Labs are reviewed and are pertinent ftetrahydrocannabinol as per history and physical.  Current Facility-Administered Medications  Medication Dose Route Frequency Provider Last Rate Last Dose  . acetaminophen (TYLENOL) tablet 650 mg  650 mg Oral Q6H PRN Ivor Costa, MD      . clonazePAM [facility]) tablet 1 mg  1 mg Oral Q6H PRN Eugenie Filler, MD      . doxepin [facility]) capsule 100 mg  100 mg Oral QHS Kimberly Ballard Hammons, RPH   100 mg at 11/19/14 2104  . haloperidol (HALDOL) tablet 5 mg  5 mg Oral Q6H PRN Thurnell Lose, MD       Or  . haloperidol lactate (HALDOL) injection 5 mg  5 mg Intramuscular Q6H PRN Thurnell Lose, MD   5 mg at 11/20/14 0920  . heparin injection 5,000 Units  5,000 Units Subcutaneous 3 times per day Ivor Costa, MD   5,000 Units at 11/20/14 0533  . insulin aspart (novoLOG) injection 0-5 Units  0-5 Units Subcutaneous QHS Samella Parr, NP   0 Units at 11/18/14 2200  . insulin aspart (novoLOG) injection 0-9 Units  0-9 Units Subcutaneous TID WC Ivor Costa, MD   3 Units at 11/20/14 0930  . insulin aspart (novoLOG) injection 0-9 Units  0-9 Units Subcutaneous TID WC Samella Parr, NP   9 Units at 11/19/14 1250  . insulin detemir (LEVEMIR) injection  18 Units  18 Units Subcutaneous Daily Irine Seal V, MD      . QUEtiapine (SEROQUEL) tablet 100 mg  100 mg Oral BID Durward Parcel, MD   100 mg at 11/20/14 0931  . traMADol (ULTRAM) tablet 100 mg  100 mg Oral Q8H PRN Reyne Dumas, MD   100 mg at 11/20/14 0646  . traMADol (ULTRAM) tablet 100 mg  100 mg Oral Once Eugenie Filler, MD        Psychiatric Specialty Exam: Physical Exam as per history and physical   ROS agitation, combative behavior, disorganized and paranoid delusions and noncompliant with the treatment   Blood pressure 106/76, pulse 96, temperature 97.6 F (36.4 C), temperature source Oral, resp. rate 18, height $RemoveBe'6\' 1"'bTjVXwBbb$  (1.854 m), weight 63.504 kg (140 lb), SpO2 96 %.Body mass index is 18.47 kg/(m^2).  General Appearance: Bizarre and Guarded  Eye Contact::  Good  Speech:  Clear and Coherent  Volume:  Normal  Mood:  Anxious and Irritable  Affect:  Appropriate and Congruent  Thought Process:  Disorganized  Orientation:  Full (Time, Place, and Person)  Thought Content:  Hallucinations: Auditory, Paranoid Ideation and Rumination  Suicidal Thoughts:  Yes.  with intent/plan  Homicidal Thoughts:  Yes.  without intent/plan  Memory:  Immediate;   Fair Recent;   Fair  Judgement:  Impaired  Insight:  Lacking  Psychomotor Activity:  Increased  Concentration:  Fair  Recall:  Good  Fund of Knowledge:Good  Language: Good  Akathisia:  NA  Handed:  Right  AIMS (if indicated):     Assets:  Agricultural consultant Intimacy Leisure Time Resilience Social Support  Sleep:      Musculoskeletal: Strength & Muscle Tone: within normal limits Gait & Station: normal Patient leans: N/A  Treatment Plan Summary: Daily contact with patient to assess and evaluate symptoms and progress in treatment Medication management Recommended acute psychiatric hospitalization as patient has been noncompliant with his medication continued to be psychotic with  the suicidal and homicidal ideation, intention or plans.  Cross Jorge,[name] 11/20/2014 10:00 AM

## 2014-11-20 NOTE — Progress Notes (Signed)
Pt. Was placed in Wine colored scrubs and voluntarily changed out of his civilian clothes and handed over his belongings (Phone,CPU,clothing). Security, Solicitor present. Security used [employer] to [employer] and pt. Pt. Was calm and cooperative during this process. Administered IV Haldol. All belongings placed in soiled utility. Will continue to monitor.

## 2014-11-20 NOTE — Plan of Care (Signed)
Problem: Consults Goal: Skin Care Protocol Initiated - if Braden Score 18 or less If consults are not indicated, leave blank or document N/A  Outcome: Not Applicable Date Met:  21/74/71  Problem: Phase I Progression Outcomes Goal: NPO or per MD order Outcome: Not Applicable Date Met:  59/53/96  Problem: Phase II Progression Outcomes Goal: Progress activity as tolerated unless otherwise ordered Outcome: Progressing  Problem: Phase III Progression Outcomes Goal: CBGs stable on SQ insulin Outcome: Progressing

## 2014-11-20 NOTE — Progress Notes (Signed)
Pt refused cardiac monitoring and peripheral IV. MD is aware.

## 2014-11-20 NOTE — Plan of Care (Signed)
Problem: Phase II Progression Outcomes Goal: Tolerating PO clear liquid diet Outcome: Completed/Met Date Met:  11/20/14  Problem: Phase III Progression Outcomes Goal: Activity at appropriate level-compared to baseline (UP IN CHAIR FOR HEMODIALYSIS)  Outcome: Completed/Met Date Met:  11/20/14

## 2014-11-20 NOTE — Clinical Social Work Psych Note (Addendum)
2:50pm- Patient has been referred to: [facility]- at capacity [facility]- referral faxed [facility]- at capacity [facility]- at capacity [facility]- at capacity, accepted fax referral  [facility]- denied aggressive behaviors/acuity [facility]- at capacity [facility] - referral sent   1:04pm- Psych CSW has sent clinical information to local and extended facilities for inpatient psychiatric placement.   Vickii Penna, LCSWA [phone]  Psychiatric & Orthopedics (5N 1-16) Clinical Social Worker '

## 2014-11-21 DIAGNOSIS — F1994 Other psychoactive substance use, unspecified with psychoactive substance-induced mood disorder: Secondary | ICD-10-CM

## 2014-11-21 LAB — GLUCOSE, CAPILLARY
Glucose-Capillary: 244 mg/dL — ABNORMAL HIGH (ref 70–99)
Glucose-Capillary: 380 mg/dL — ABNORMAL HIGH (ref 70–99)
Glucose-Capillary: 291 mg/dL — ABNORMAL HIGH (ref 70–99)
Glucose-Capillary: 164 mg/dL — ABNORMAL HIGH (ref 70–99)

## 2014-11-21 LAB — BASIC METABOLIC PANEL
ANION GAP: 10 (ref 5–15)
BUN: 11 mg/dL (ref 6–23)
CHLORIDE: 99 meq/L (ref 96–112)
CO2: 31 meq/L (ref 19–32)
Calcium: 9.8 mg/dL (ref 8.4–10.5)
Creatinine, Ser: 0.59 mg/dL (ref 0.50–1.35)
GFR calc Af Amer: >90 mL/min (ref >90)
GFR calc non Af Amer: >90 mL/min (ref >90)
Glucose, Bld: 222 mg/dL — ABNORMAL HIGH (ref 70–99)
POTASSIUM: 4.4 meq/L (ref 3.7–5.3)
Sodium: 140 mEq/L (ref 137–147)

## 2014-11-21 MED ORDER — INSULIN DETEMIR 100 UNIT/ML ~~LOC~~ SOLN
5.0000 [IU] | Freq: Once | SUBCUTANEOUS | Status: AC
  Administered 2014-11-21: 5 [IU] via SUBCUTANEOUS
  Filled 2014-11-21: qty 0.05

## 2014-11-21 MED ORDER — INSULIN DETEMIR 100 UNIT/ML ~~LOC~~ SOLN
35.0000 [IU] | Freq: Every day | SUBCUTANEOUS | Status: DC
  Administered 2014-11-22: 35 [IU] via SUBCUTANEOUS
  Filled 2014-11-21: qty 0.35

## 2014-11-21 MED ORDER — INSULIN ASPART 100 UNIT/ML ~~LOC~~ SOLN: 0.0000 [IU] | Freq: Every day | SUBCUTANEOUS | Status: DC

## 2014-11-21 MED ORDER — INSULIN DETEMIR 100 UNIT/ML ~~LOC~~ SOLN
30.0000 [IU] | Freq: Every day | SUBCUTANEOUS | Status: DC
  Administered 2014-11-21: 30 [IU] via SUBCUTANEOUS
  Filled 2014-11-21: qty 0.3

## 2014-11-21 MED ORDER — INSULIN ASPART 100 UNIT/ML ~~LOC~~ SOLN
0.0000 [IU] | Freq: Three times a day (TID) | SUBCUTANEOUS | Status: DC
  Administered 2014-11-22: 3 [IU] via SUBCUTANEOUS

## 2014-11-21 NOTE — Consult Note (Signed)
[name] Face-to-Face Psychiatry Consult   Reason for Consult:  Psychosis and agitation Referring Physician:  Dr. [name] is an 28 y.o. male. Total Time spent with patient: 45 minutes  Assessment: AXIS I:  Schizoaffective Disorder and Substance Abuse AXIS II:  Cluster A Traits AXIS III:   Past Medical History  Diagnosis Date  . Diabetes mellitus   . Depression   . Schizo affective schizophrenia   . Polysubstance abuse   . Scoliosis   . Chronic pain   . Noncompliance with medication regimen   . DKA (diabetic ketoacidoses) 11/18/2014  . GERD (gastroesophageal reflux disease)    AXIS IV:  economic problems, occupational problems, other psychosocial or environmental problems, problems related to social environment and problems with primary support group AXIS V:  41-50 serious symptoms  Plan:  Case discussed with the social service and [name] [place] Patient was placed on involuntary commitment We'll continue his current medication management including when necessary Haldol and Ativan for agitation and aggressive behaviors Patient was previously taken [name] and Prophenazine  Recommend psychiatric Inpatient admission when medically cleared. Supportive therapy provided about ongoing stressors.  Appreciate psychiatric consultation Please contact [phone] or [phone] if needs further assistance   Subjective:   [name] is a 28 y.o. male patient admitted with Psychosis and agitation.  HPI: [name] is a 28 y.o. male seen for the last 2 days and finally able to complete the assessment today. Patient reported he has been diagnosed with a schizoaffective disorder and also smoked marijuana came to the [name] with a diabetic ketoacidosis. Patient has been noncompliant with the antipsychotic medications over few months and continue to have delusional thoughts, paranoid ideation and disorganized behaviors. Patient has stated people are  playing with his mind and talking to him bad stuff and he feels like jumping out of the window and trying to walk away from the hospital without proper procedure. The patient was stopped by the staff he become combative which required chemical sedation and physical restraints to prevent danger to himself under others. Yesterday morning he tried to run away from the hospital when he was offered medication and then started combative, yelling screaming and trying to throw his hand to her staff members and made threatening to hurt them on their families and required soft restraints and chemical sedation again. Patient has been off of his restraints, calm and cooperative during this evaluation but continued to be resistant with psychiatric medication management and does not want to be admitted to inpatient psychiatric hospitalization. Patient continued to show poor insight, judgment and impulse control. Patient reportedly came into the [place] about 4 days ago from [place]. Patient also reportedly falling around [place] and [place]. Spoke with the patient mother who reported recent has been disabled and receiving Social Security and misusing his money and continue to leave places and becoming homeless. Patient mother believes patient need to be may psychiatric placement when he can be stabilized. Patient urine drug screen is positive for tetrahydrocannabinol but not amphetamine even though he reports taking Adderall from a doctor in [place]. Patient does not have a outpatient psychiatric services in [place] at this time. Patient is willing to follow up with the family service of [place] upon discharged.  Patient is seen again today on 11/21/2014. He was initially pleasant and friendly but then became rather agitated and angry. He has absolutely no insight into his illness. He states that the only medicine is ever helped  him his Adderall and claims that when he leaves here will not take any other psychiatric  medications. He denies hearing voices or having delusions today. He denies being depressed or suicidal. He wants to be discharged as soon as possible and return to the family services with the [place]. He is currently homeless but was willing to live in a shelter. I'm concerned about letting him go when his insight is so poor. However it is unlikely that this will change anytime soon  Medical history: Patient with past medical history diabetes type 1, depression, schizophrenia, who presents with polydipsia and polyuria. Patient reports that he has type 1 diabetes. He has not been taking his medications for more than a week. He developed polyuria and polydipsia. He also feel confused and dizzy. He does not have fever, chills, shortness of breath, chest pain. No nausea, vomiting or abdominal pain. He does not have dysuria or burning on urination. Patient also reports having chronic bilateral hip pain, he pointed to the bilateral groin areas. The pain is aggravated by medially rotating the legs. He said he had a steroid injection in the past with good relief. Work up in the ED demonstrates elevated blood sugar level with anion gap of 18. UDS positive for THC, urinalysis negative for UTI, but with positive ketone. Patient is admitted to inpatient for further evaluation and treatment.  HPI Elements:  Location:  Paranoid delusions and disorganized thoughts. Quality:  Agitation and combative behavior. Severity:  Noncompliant with his treatment. Timing:  Admitted for diabetic ketoacidosis.  Past Psychiatric History: Past Medical History  Diagnosis Date  . Diabetes mellitus   . Depression   . Schizo affective schizophrenia   . Polysubstance abuse   . Scoliosis   . Chronic pain   . Noncompliance with medication regimen   . DKA (diabetic ketoacidoses) 11/18/2014  . GERD (gastroesophageal reflux disease)     reports that he has been smoking.  He has never used smokeless tobacco. He reports that he drinks  alcohol. He reports that he uses illicit drugs (Marijuana). Family History  Problem Relation Age of Onset  . Diabetes Mother      Living Arrangements: Other (Comment)   Abuse/Neglect [name]) Physical Abuse: Denies Verbal Abuse: Denies Sexual Abuse: Denies Allergies:   Allergies  Allergen Reactions  . Sulfa Antibiotics Other (See Comments)    Unknown - Childhood    ACT Assessment Complete:  No Objective: Blood pressure 100/74, pulse 79, temperature 97.9 F (36.6 C), temperature source Oral, resp. rate 18, height $RemoveBe'6\' 1"'LcDKVAAoI$  (1.854 m), weight 140 lb (63.504 kg), SpO2 98 %.Body mass index is 18.47 kg/(m^2). Results for orders placed or performed during the hospital encounter of 11/18/14 (from the past 72 hour(s))  Glucose, capillary     Status: Abnormal   Collection Time: 11/18/14 10:06 PM  Result Value Ref Range   Glucose-Capillary 62 (L) 70 - 99 mg/dL  MRSA PCR Screening     Status: None   Collection Time: 11/19/14 12:36 AM  Result Value Ref Range   MRSA by PCR NEGATIVE NEGATIVE    Comment:        The GeneXpert MRSA Assay (FDA approved for NASAL specimens only), is one component of a comprehensive MRSA colonization surveillance program. It is not intended to diagnose MRSA infection nor to guide or monitor treatment for MRSA infections.   CBC     Status: Abnormal   Collection Time: 11/19/14  5:03 AM  Result Value Ref Range   WBC 5.9  4.0 - 10.5 K/uL   RBC 4.46 4.22 - 5.81 MIL/uL   Hemoglobin 13.8 13.0 - 17.0 g/dL   HCT 38.9 (L) 39.0 - 52.0 %   MCV 87.2 78.0 - 100.0 fL   MCH 30.9 26.0 - 34.0 pg   MCHC 35.5 30.0 - 36.0 g/dL   RDW 11.8 11.5 - 15.5 %   Platelets 209 150 - 400 K/uL    Comment: REPEATED TO VERIFY SPECIMEN CHECKED FOR CLOTS   Comprehensive metabolic panel     Status: Abnormal   Collection Time: 11/19/14  5:03 AM  Result Value Ref Range   Sodium 142 137 - 147 mEq/L   Potassium 3.8 3.7 - 5.3 mEq/L   Chloride 104 96 - 112 mEq/L   CO2 26 19 - 32 mEq/L    Glucose, Bld 104 (H) 70 - 99 mg/dL   BUN 10 6 - 23 mg/dL   Creatinine, Ser 0.47 (L) 0.50 - 1.35 mg/dL   Calcium 8.7 8.4 - 10.5 mg/dL   Total Protein 5.3 (L) 6.0 - 8.3 g/dL   Albumin 3.4 (L) 3.5 - 5.2 g/dL   AST 21 0 - 37 U/L    Comment: HEMOLYSIS AT THIS LEVEL MAY AFFECT RESULT   ALT 12 0 - 53 U/L   Alkaline Phosphatase 46 39 - 117 U/L   Total Bilirubin <0.2 (L) 0.3 - 1.2 mg/dL   GFR calc non Af Amer >90 >90 mL/min   GFR calc Af Amer >90 >90 mL/min    Comment: (NOTE) The eGFR has been calculated using the CKD EPI equation. This calculation has not been validated in all clinical situations. eGFR's persistently <90 mL/min signify possible Chronic Kidney Disease.    Anion gap 12 5 - 15  Glucose, capillary     Status: Abnormal   Collection Time: 11/19/14  6:08 AM  Result Value Ref Range   Glucose-Capillary 154 (H) 70 - 99 mg/dL  Glucose, capillary     Status: Abnormal   Collection Time: 11/19/14 12:35 PM  Result Value Ref Range   Glucose-Capillary 371 (H) 70 - 99 mg/dL  Glucose, capillary     Status: Abnormal   Collection Time: 11/19/14  4:49 PM  Result Value Ref Range   Glucose-Capillary 113 (H) 70 - 99 mg/dL  Glucose, capillary     Status: Abnormal   Collection Time: 11/19/14  9:02 PM  Result Value Ref Range   Glucose-Capillary 136 (H) 70 - 99 mg/dL  Glucose, capillary     Status: Abnormal   Collection Time: 11/19/14 10:24 PM  Result Value Ref Range   Glucose-Capillary 237 (H) 70 - 99 mg/dL  CBC     Status: None   Collection Time: 11/20/14  6:41 AM  Result Value Ref Range   WBC 4.6 4.0 - 10.5 K/uL   RBC 4.57 4.22 - 5.81 MIL/uL   Hemoglobin 13.7 13.0 - 17.0 g/dL   HCT 39.6 39.0 - 52.0 %   MCV 86.7 78.0 - 100.0 fL   MCH 30.0 26.0 - 34.0 pg   MCHC 34.6 30.0 - 36.0 g/dL   RDW 11.9 11.5 - 15.5 %   Platelets 203 150 - 400 K/uL  Basic metabolic panel     Status: Abnormal   Collection Time: 11/20/14  6:41 AM  Result Value Ref Range   Sodium 138 137 - 147 mEq/L    Potassium 4.0 3.7 - 5.3 mEq/L   Chloride 99 96 - 112 mEq/L   CO2 30 19 -  32 mEq/L   Glucose, Bld 241 (H) 70 - 99 mg/dL   BUN 9 6 - 23 mg/dL   Creatinine, Ser 9.15 0.50 - 1.35 mg/dL   Calcium 8.9 8.4 - 05.6 mg/dL   GFR calc non Af Amer >90 >90 mL/min   GFR calc Af Amer >90 >90 mL/min    Comment: (NOTE) The eGFR has been calculated using the CKD EPI equation. This calculation has not been validated in all clinical situations. eGFR's persistently <90 mL/min signify possible Chronic Kidney Disease.    Anion gap 9 5 - 15  Glucose, capillary     Status: Abnormal   Collection Time: 11/20/14  7:43 AM  Result Value Ref Range   Glucose-Capillary 249 (H) 70 - 99 mg/dL  Glucose, capillary     Status: Abnormal   Collection Time: 11/20/14 11:20 AM  Result Value Ref Range   Glucose-Capillary 292 (H) 70 - 99 mg/dL  Glucose, capillary     Status: Abnormal   Collection Time: 11/20/14  5:01 PM  Result Value Ref Range   Glucose-Capillary 187 (H) 70 - 99 mg/dL  Glucose, capillary     Status: Abnormal   Collection Time: 11/20/14  9:08 PM  Result Value Ref Range   Glucose-Capillary 232 (H) 70 - 99 mg/dL   Comment 1 Notify RN    Comment 2 Documented in Chart   Basic metabolic panel     Status: Abnormal   Collection Time: 11/21/14  6:05 AM  Result Value Ref Range   Sodium 140 137 - 147 mEq/L   Potassium 4.4 3.7 - 5.3 mEq/L   Chloride 99 96 - 112 mEq/L   CO2 31 19 - 32 mEq/L   Glucose, Bld 222 (H) 70 - 99 mg/dL   BUN 11 6 - 23 mg/dL   Creatinine, Ser 9.79 0.50 - 1.35 mg/dL   Calcium 9.8 8.4 - 48.0 mg/dL   GFR calc non Af Amer >90 >90 mL/min   GFR calc Af Amer >90 >90 mL/min    Comment: (NOTE) The eGFR has been calculated using the CKD EPI equation. This calculation has not been validated in all clinical situations. eGFR's persistently <90 mL/min signify possible Chronic Kidney Disease.    Anion gap 10 5 - 15  Glucose, capillary     Status: Abnormal   Collection Time: 11/21/14  8:03 AM   Result Value Ref Range   Glucose-Capillary 244 (H) 70 - 99 mg/dL  Glucose, capillary     Status: Abnormal   Collection Time: 11/21/14 12:25 PM  Result Value Ref Range   Glucose-Capillary 380 (H) 70 - 99 mg/dL  Glucose, capillary     Status: Abnormal   Collection Time: 11/21/14  5:13 PM  Result Value Ref Range   Glucose-Capillary 291 (H) 70 - 99 mg/dL   Labs are reviewed and are pertinent ftetrahydrocannabinol as per history and physical.  Current Facility-Administered Medications  Medication Dose Route Frequency Provider Last Rate Last Dose  . acetaminophen (TYLENOL) tablet 650 mg  650 mg Oral Q6H PRN Lorretta Harp, MD      . clonaze[facility]AM [name]) tablet 1 mg  1 mg Oral Q6H PRN Rodolph Bong, MD   1 mg at 11/21/14 1110  . doxepin (SINEQUAN) capsule 100 mg  100 mg Oral QHS Judie Bonus Hammons, RPH   100 mg at 11/20/14 2258  . haloperidol (HALDOL) tablet 5 mg  5 mg Oral Q6H PRN Leroy Sea, MD   5 mg at 11/21/14  1553   Or  . haloperidol lactate (HALDOL) injection 5 mg  5 mg Intramuscular Q6H PRN Thurnell Lose, MD   5 mg at 11/20/14 0920  . heparin injection 5,000 Units  5,000 Units Subcutaneous 3 times per day Ivor Costa, MD   5,000 Units at 11/21/14 1454  . insulin aspart (novoLOG) injection 0-5 Units  0-5 Units Subcutaneous QHS Samella Parr, NP   2 Units at 11/20/14 2259  . insulin aspart (novoLOG) injection 0-9 Units  0-9 Units Subcutaneous TID WC Samella Parr, NP   5 Units at 11/21/14 1821  . insulin detemir (LEVEMIR) injection 30 Units  30 Units Subcutaneous Daily Eugenie Filler, MD   30 Units at 11/21/14 1000  . QUEtiapine (SEROQUEL) tablet 100 mg  100 mg Oral BID Durward Parcel, MD   100 mg at 11/21/14 1033  . traMADol (ULTRAM) tablet 100 mg  100 mg Oral Q6H PRN Eugenie Filler, MD   100 mg at 11/21/14 1244    Psychiatric Specialty Exam: Physical Exam as per history and physical   ROS agitation, combative behavior, disorganized and paranoid  delusions and noncompliant with the treatment   Blood pressure 100/74, pulse 79, temperature 97.9 F (36.6 C), temperature source Oral, resp. rate 18, height $RemoveBe'6\' 1"'LSCPQtRle$  (1.854 m), weight 140 lb (63.504 kg), SpO2 98 %.Body mass index is 18.47 kg/(m^2).  General Appearance: Bizarre and Guarded  Eye Contact::  Good  Speech:  Clear and Coherent  Volume:  Normal  Mood:  Anxious and Irritable  Affect:  Appropriate and Congruent  Thought Process:  Disorganized  Orientation:  Full (Time, Place, and Person)  Thought Content:  Denies hallucinations today   Suicidal Thoughts:  Denies today   Homicidal Thoughts: Denies today   Memory:  Immediate;   Fair Recent;   Fair  Judgement:  Impaired  Insight:  Lacking  Psychomotor Activity:  Increased  Concentration:  Fair  Recall:  Good  Fund of Knowledge:Good  Language: Good  Akathisia:  NA  Handed:  Right  AIMS (if indicated):     Assets:  Agricultural consultant Intimacy Leisure Time Resilience Social Support  Sleep:      Musculoskeletal: Strength & Muscle Tone: within normal limits Gait & Station: normal Patient leans: N/A  Treatment Plan Summary: Daily contact with patient to assess and evaluate symptoms and progress in treatment Medication management Recommended continuing antipsychotic medication. His thought processes improving but his insight remains poor. However given that he no longer endorses symptoms of paranoia delusions auditory or visual hallucinations or suicidality he's not going to be committable and will probably need to be discharged despite his poor insight and noncompliance. I will return tomorrow to reassess the situation  Harrington Challenger [facility] 11/21/2014 6:41 PM

## 2014-11-21 NOTE — Progress Notes (Signed)
[facility] PROGRESS NOTE  [name] KDT:[id number] DOB: 1985-12-30 DOA: 11/18/2014 PCP: Default, Provider, MD  Assessment/Plan: #1 DKA/poorly controlled type 1 diabetes. Secondary to medical noncompliance. Anion gap is closed. Hemoglobin A1c is 11.5. CBGs have ranged from 244 -380. Increase Levemir to 35 units daily. Continue sliding scale insulin. Follow CBGs and titrate as needed. Outpatient diabetes follow-up.  #2 schizophrenia with psychosis Patient with agitation and belligerent behavior. Patient with auditory hallucinations. Patient denies any suicidal ideation. Patient denies any homicidal ideation. Patient is currently on the IVC and needs inpatient psychiatric treatment. Continue current regimen of Seroquel 100 mg twice a day. Haldol and Klonopin when necessary.  #3 bilateral hip pain Patient with complaints of hip pain. X-rays negative for fracture or dislocation. MRI of the left hip negative for any acute abnormalities. Continue pain management. Try to avoid narcotics. Outpatient follow-up.  #4 generalized anxiety disorder Continue doxepin. Psychiatric follow-up.  #5 prophylaxis Heparin for DVT prophylaxis.  Code Status: Full Family Communication: Updated patient and family present. Disposition Plan: To inpatient psych when bed available.   Consultants:  Psychiatry: Dr. Elsie Saas 11/19/2014  Procedures:  X-ray of bilateral hips 11/18/2014  MRI of left hip 11/18/2014  Antibiotics:  None  HPI/Subjective: Patient was still complaints of auditory hallucinations which he states are improving. Patient denies any homicidal ideation. Patient denies any suicidal ideation.   Objective: Filed Vitals:   11/21/14 0557  BP: 93/60  Pulse:   Temp: 97.5 F (36.4 C)  Resp:     Intake/Output Summary (Last 24 hours) at 11/21/14 1058 Last data filed at 11/21/14 1015  Gross per 24 hour  Intake   1080 ml  Output      0 ml  Net   1080 ml   Filed Weights    11/18/14 0323  Weight: 63.504 kg (140 lb)    Exam:   General:  NAD  Cardiovascular: RRR  Respiratory: CTAB  Abdomen: Soft, nontender, nondistended, positive bowel sounds.  Musculoskeletal: No clubbing cyanosis or edema.  Data Reviewed: Basic Metabolic Panel:  Recent Labs Lab 11/18/14 0153 11/18/14 0512 11/19/14 0503 11/20/14 0641 11/21/14 0605  NA 139 147 142 138 140  K 4.0 3.4* 3.8 4.0 4.4  CL 94* 107 104 99 99  CO2 27 26 26 30 31   GLUCOSE 504* 140* 104* 241* 222*  BUN 13 13 10 9 11   CREATININE 0.93 0.61 0.47* 0.58 0.59  CALCIUM 9.8 8.5 8.7 8.9 9.8   Liver Function Tests:  Recent Labs Lab 11/17/14 1439 11/19/14 0503  AST 14 21  ALT 14 12  ALKPHOS 74 46  BILITOT 0.4 <0.2*  PROT 7.3 5.3*  ALBUMIN 4.8 3.4*   No results for input(s): LIPASE, AMYLASE in the last 168 hours. No results for input(s): AMMONIA in the last 168 hours. CBC:  Recent Labs Lab 11/17/14 1319 11/17/14 1439 11/17/14 1446 11/19/14 0503 11/20/14 0641  WBC  --  8.3  --  5.9 4.6  HGB 16.0 16.4 16.3 13.8 13.7  HCT 47.0 44.6 48.0 38.9* 39.6  MCV  --  87.3  --  87.2 86.7  PLT  --  297  --  209 203   Cardiac Enzymes: No results for input(s): CKTOTAL, CKMB, CKMBINDEX, TROPONINI in the last 168 hours. BNP (last 3 results) No results for input(s): PROBNP in the last 8760 hours. CBG:  Recent Labs Lab 11/20/14 0743 11/20/14 1120 11/20/14 1701 11/20/14 2108 11/21/14 0803  GLUCAP 249* 292* 187* 232* 244*    Recent  Results (from the past 240 hour(s))  Culture, blood (routine x 2)     Status: None (Preliminary result)   Collection Time: 11/18/14  5:00 AM  Result Value Ref Range Status   Specimen Description BLOOD ARM LEFT  Final   Special Requests BOTTLES DRAWN AEROBIC AND ANAEROBIC 10CC  Final   Culture  Setup Time   Final    11/18/2014 08:15 Performed at [facility]    Culture   Final           BLOOD CULTURE RECEIVED NO GROWTH TO DATE CULTURE WILL BE HELD FOR 5  DAYS BEFORE ISSUING A FINAL NEGATIVE REPORT Performed at [facility]    Report Status PENDING  Incomplete  Culture, blood (routine x 2)     Status: None (Preliminary result)   Collection Time: 11/18/14  5:12 AM  Result Value Ref Range Status   Specimen Description BLOOD HAND LEFT  Final   Special Requests BOTTLES DRAWN AEROBIC ONLY 10CC  Final   Culture  Setup Time   Final    11/18/2014 08:15 Performed at [facility]    Culture   Final           BLOOD CULTURE RECEIVED NO GROWTH TO DATE CULTURE WILL BE HELD FOR 5 DAYS BEFORE ISSUING A FINAL NEGATIVE REPORT Performed at [facility]    Report Status PENDING  Incomplete  MRSA PCR Screening     Status: None   Collection Time: 11/19/14 12:36 AM  Result Value Ref Range Status   MRSA by PCR NEGATIVE NEGATIVE Final    Comment:        The GeneXpert MRSA Assay (FDA approved for NASAL specimens only), is one component of a comprehensive MRSA colonization surveillance program. It is not intended to diagnose MRSA infection nor to guide or monitor treatment for MRSA infections.      Studies: No results found.  Scheduled Meds: . doxepin  100 mg Oral QHS  . heparin  5,000 Units Subcutaneous 3 times per day  . insulin aspart  0-5 Units Subcutaneous QHS  . insulin aspart  0-9 Units Subcutaneous TID WC  . insulin detemir  30 Units Subcutaneous Daily  . QUEtiapine  100 mg Oral BID   Continuous Infusions:   Principal Problem:   DKA (diabetic ketoacidoses) Active Problems:   Schizophrenia, undifferentiated   Generalized anxiety disorder   Diabetes type 1, uncontrolled   Hip pain, bilateral   Involuntary commitment    Time spent: 40 mins    [facility] MD [facility] Pager [phone]. If 7PM-7AM, please contact night-coverage at [email or web address], password [facility] 11/21/2014, 10:58 AM  LOS: 3 days

## 2014-11-22 ENCOUNTER — Telehealth: Payer: Medicare Other | Admitting: Physician Assistant

## 2014-11-22 DIAGNOSIS — E0810 Diabetes mellitus due to underlying condition with ketoacidosis without coma: Secondary | ICD-10-CM | POA: Insufficient documentation

## 2014-11-22 DIAGNOSIS — E860 Dehydration: Secondary | ICD-10-CM

## 2014-11-22 LAB — BASIC METABOLIC PANEL
Anion gap: 12 (ref 5–15)
BUN: 19 mg/dL (ref 6–23)
CHLORIDE: 97 meq/L (ref 96–112)
CO2: 29 mEq/L (ref 19–32)
Calcium: 9.5 mg/dL (ref 8.4–10.5)
Creatinine, Ser: 0.59 mg/dL (ref 0.50–1.35)
GFR calc non Af Amer: >90 mL/min (ref >90)
GLUCOSE: 292 mg/dL — AB (ref 70–99)
POTASSIUM: 4.2 meq/L (ref 3.7–5.3)
Sodium: 138 mEq/L (ref 137–147)

## 2014-11-22 LAB — GLUCOSE, CAPILLARY
GLUCOSE-CAPILLARY: 75 mg/dL (ref 70–99)
Glucose-Capillary: 401 mg/dL — ABNORMAL HIGH (ref 70–99)
Glucose-Capillary: 138 mg/dL — ABNORMAL HIGH (ref 70–99)
Glucose-Capillary: 214 mg/dL — ABNORMAL HIGH (ref 70–99)

## 2014-11-22 MED ORDER — DOXEPIN HCL 100 MG PO CAPS: 100.0000 mg | ORAL_CAPSULE | Freq: Every day | ORAL | Status: DC

## 2014-11-22 MED ORDER — QUETIAPINE FUMARATE 100 MG PO TABS: 100.0000 mg | ORAL_TABLET | Freq: Two times a day (BID) | ORAL | Status: DC

## 2014-11-22 MED ORDER — TRAMADOL HCL 50 MG PO TABS: 100.0000 mg | ORAL_TABLET | Freq: Four times a day (QID) | ORAL | Status: DC | PRN

## 2014-11-22 MED ORDER — INSULIN DETEMIR 100 UNIT/ML ~~LOC~~ SOLN: 35.0000 [IU] | Freq: Every day | SUBCUTANEOUS | Status: DC

## 2014-11-22 MED ORDER — INSULIN ASPART 100 UNIT/ML ~~LOC~~ SOLN
24.0000 [IU] | Freq: Once | SUBCUTANEOUS | Status: AC
  Administered 2014-11-22: 24 [IU] via SUBCUTANEOUS

## 2014-11-22 NOTE — Progress Notes (Signed)
Patient discharge teaching given, including activity, diet, follow-up appoints, and medications. Patient verbalized understanding of all discharge instructions. IV access was d/c'd. Vitals are stable. Skin is intact except as charted in most recent assessments. Pt to be escorted out by NT, to be driven home by family. 

## 2014-11-22 NOTE — Consult Note (Signed)
[name]   Reason for Consult:  Psychosis and agitation Referring Physician:  Dr. [name] is an 28 y.o. male. Total Time spent with patient: 45 minutes  Assessment: AXIS I:  Schizoaffective Disorder and Substance Abuse AXIS II:  Cluster A Traits AXIS III:   Past Medical History  Diagnosis Date  . Diabetes mellitus   . Depression   . Schizo affective schizophrenia   . Polysubstance abuse   . Scoliosis   . Chronic pain   . Noncompliance with medication regimen   . DKA (diabetic ketoacidoses) 11/18/2014  . GERD (gastroesophageal reflux disease)    AXIS IV:  economic problems, occupational problems, other psychosocial or environmental problems, problems related to social environment and problems with primary support group AXIS V:  41-50 serious symptoms  Plan:  Case discussed with the social service and [facility] from [place] Patient was placed on involuntary commitment We'll continue his current medication management including when necessary Haldol and Ativan for agitation and aggressive behaviors Patient was previously taken [place] and Prophenazine  Recommend psychiatric Inpatient admission when medically cleared. Supportive therapy provided about ongoing stressors.  Appreciate psychiatric consultation Please contact [phone] or [phone] if needs further assistance   Subjective:   [name] is a 28 y.o. male patient admitted with Psychosis and agitation.  HPI: [name] is a 28 y.o. male seen for the last 2 days and finally able to complete the assessment today. Patient reported he has been diagnosed with a schizoaffective disorder and also smoked marijuana came to the [facility] with a diabetic ketoacidosis. Patient has been noncompliant with the antipsychotic medications over few months and continue to have delusional thoughts, paranoid ideation and disorganized behaviors. Patient has stated people  are playing with his mind and talking to him bad stuff and he feels like jumping out of the window and trying to walk away from the hospital without proper procedure. The patient was stopped by the staff he become combative which required chemical sedation and physical restraints to prevent danger to himself under others. Yesterday morning he tried to run away from the hospital when he was offered medication and then started combative, yelling screaming and trying to throw his hand to her staff members and made threatening to hurt them on their families and required soft restraints and chemical sedation again. Patient has been off of his restraints, calm and cooperative during this evaluation but continued to be resistant with psychiatric medication management and does not want to be admitted to inpatient psychiatric hospitalization. Patient continued to show poor insight, judgment and impulse control. Patient reportedly came into the [facility] about 4 days ago from [place]. Patient also reportedly falling around [place] and [place]. Spoke with the patient mother who reported recent has been disabled and receiving Social Security and misusing his money and continue to leave places and becoming homeless. Patient mother believes patient need to be may psychiatric placement when he can be stabilized. Patient urine drug screen is positive for tetrahydrocannabinol but not amphetamine even though he reports taking Adderall from a doctor in [place]. Patient does not have a outpatient psychiatric services in [place] at this time. Patient is willing to follow up with the [facility] [place] upon discharged.  Patient is seen again today on 12/12/2014. He states that he really wants to go home and he is not good to do anything to harm himself. He denies any suicidal ideation auditory visual hallucinations or  paranoia. For the last several days he's been calm and cooperative with the staff here and medication  compliant. He states he is already arranged follow-up at [facility] the [place] and also has follow-up for management of diabetes with his primary physician. He is agreeable to staying at the homeless shelter. Given that he is not exhibiting any signs of dangerousness to self or others we really cannot hold him here under involuntary commitment any longer  Medical history: Patient with past medical history diabetes type 1, depression, schizophrenia, who presents with polydipsia and polyuria. Patient reports that he has type 1 diabetes. He has not been taking his medications for more than a week. He developed polyuria and polydipsia. He also feel confused and dizzy. He does not have fever, chills, shortness of breath, chest pain. No nausea, vomiting or abdominal pain. He does not have dysuria or burning on urination. Patient also reports having chronic bilateral hip pain, he pointed to the bilateral groin areas. The pain is aggravated by medially rotating the legs. He said he had a steroid injection in the past with good relief. Work up in the ED demonstrates elevated blood sugar level with anion gap of 18. UDS positive for THC, urinalysis negative for UTI, but with positive ketone. Patient is admitted to inpatient for further evaluation and treatment.  HPI Elements:  Location:  Paranoid delusions and disorganized thoughts. Quality:  Agitation and combative behavior. Severity:  Noncompliant with his treatment. Timing:  Admitted for diabetic ketoacidosis.  Past Psychiatric History: Past Medical History  Diagnosis Date  . Diabetes mellitus   . Depression   . Schizo affective schizophrenia   . Polysubstance abuse   . Scoliosis   . Chronic pain   . Noncompliance with medication regimen   . DKA (diabetic ketoacidoses) 11/18/2014  . GERD (gastroesophageal reflux disease)     reports that he has been smoking.  He has never used smokeless tobacco. He reports that he drinks alcohol. He reports  that he uses illicit drugs (Marijuana). Family History  Problem Relation Age of Onset  . Diabetes Mother      Living Arrangements: Other (Comment)   Abuse/Neglect [facility]) Physical Abuse: Denies Verbal Abuse: Denies Sexual Abuse: Denies Allergies:   Allergies  Allergen Reactions  . Sulfa Antibiotics Other (See Comments)    Unknown - Childhood    ACT Assessment Complete:  No Objective: Blood pressure 90/59, pulse 124, temperature 98 F (36.7 C), temperature source Oral, resp. rate 18, height _0  (1.854 m), weight 140 lb (63.504 kg), SpO2 98 %.Body mass index is 18.47 kg/(m^2). Results for orders placed or performed during the hospital encounter of 11/18/14 (from the past 72 hour(s))  Glucose, capillary     Status: Abnormal   Collection Time: 11/19/14  4:49 PM  Result Value Ref Range   Glucose-Capillary 113 (H) 70 - 99 mg/dL  Glucose, capillary     Status: Abnormal   Collection Time: 11/19/14  9:02 PM  Result Value Ref Range   Glucose-Capillary 136 (H) 70 - 99 mg/dL  Glucose, capillary     Status: Abnormal   Collection Time: 11/19/14 10:24 PM  Result Value Ref Range   Glucose-Capillary 237 (H) 70 - 99 mg/dL  CBC     Status: None   Collection Time: 11/20/14  6:41 AM  Result Value Ref Range   WBC 4.6 4.0 - 10.5 K/uL   RBC 4.57 4.22 - 5.81 MIL/uL   Hemoglobin 13.7 13.0 - 17.0 g/dL  HCT 39.6 39.0 - 52.0 %   MCV 86.7 78.0 - 100.0 fL   MCH 30.0 26.0 - 34.0 pg   MCHC 34.6 30.0 - 36.0 g/dL   RDW 11.9 11.5 - 15.5 %   Platelets 203 150 - 400 K/uL  Basic metabolic panel     Status: Abnormal   Collection Time: 11/20/14  6:41 AM  Result Value Ref Range   Sodium 138 137 - 147 mEq/L   Potassium 4.0 3.7 - 5.3 mEq/L   Chloride 99 96 - 112 mEq/L   CO2 30 19 - 32 mEq/L   Glucose, Bld 241 (H) 70 - 99 mg/dL   BUN 9 6 - 23 mg/dL   Creatinine, Ser 0.58 0.50 - 1.35 mg/dL   Calcium 8.9 8.4 - 10.5 mg/dL   GFR calc non Af Amer >90 >90 mL/min   GFR calc Af Amer >90 >90 mL/min     Comment: (NOTE) The eGFR has been calculated using the CKD EPI equation. This calculation has not been validated in all clinical situations. eGFR's persistently <90 mL/min signify possible Chronic Kidney Disease.    Anion gap 9 5 - 15  Glucose, capillary     Status: Abnormal   Collection Time: 11/20/14  7:43 AM  Result Value Ref Range   Glucose-Capillary 249 (H) 70 - 99 mg/dL  Glucose, capillary     Status: Abnormal   Collection Time: 11/20/14 11:20 AM  Result Value Ref Range   Glucose-Capillary 292 (H) 70 - 99 mg/dL  Glucose, capillary     Status: Abnormal   Collection Time: 11/20/14  5:01 PM  Result Value Ref Range   Glucose-Capillary 187 (H) 70 - 99 mg/dL  Glucose, capillary     Status: Abnormal   Collection Time: 11/20/14  9:08 PM  Result Value Ref Range   Glucose-Capillary 232 (H) 70 - 99 mg/dL   Comment 1 Notify RN    Comment 2 Documented in Chart   Basic metabolic panel     Status: Abnormal   Collection Time: 11/21/14  6:05 AM  Result Value Ref Range   Sodium 140 137 - 147 mEq/L   Potassium 4.4 3.7 - 5.3 mEq/L   Chloride 99 96 - 112 mEq/L   CO2 31 19 - 32 mEq/L   Glucose, Bld 222 (H) 70 - 99 mg/dL   BUN 11 6 - 23 mg/dL   Creatinine, Ser 0.59 0.50 - 1.35 mg/dL   Calcium 9.8 8.4 - 10.5 mg/dL   GFR calc non Af Amer >90 >90 mL/min   GFR calc Af Amer >90 >90 mL/min    Comment: (NOTE) The eGFR has been calculated using the CKD EPI equation. This calculation has not been validated in all clinical situations. eGFR's persistently <90 mL/min signify possible Chronic Kidney Disease.    Anion gap 10 5 - 15  Glucose, capillary     Status: Abnormal   Collection Time: 11/21/14  8:03 AM  Result Value Ref Range   Glucose-Capillary 244 (H) 70 - 99 mg/dL  Glucose, capillary     Status: Abnormal   Collection Time: 11/21/14 12:25 PM  Result Value Ref Range   Glucose-Capillary 380 (H) 70 - 99 mg/dL  Glucose, capillary     Status: Abnormal   Collection Time: 11/21/14  5:13 PM   Result Value Ref Range   Glucose-Capillary 291 (H) 70 - 99 mg/dL  Glucose, capillary     Status: Abnormal   Collection Time: 11/21/14 10:05 PM  Result  Value Ref Range   Glucose-Capillary 164 (H) 70 - 99 mg/dL   Comment 1 Documented in Chart    Comment 2 Notify RN   Basic metabolic panel     Status: Abnormal   Collection Time: 11/22/14  6:45 AM  Result Value Ref Range   Sodium 138 137 - 147 mEq/L   Potassium 4.2 3.7 - 5.3 mEq/L   Chloride 97 96 - 112 mEq/L   CO2 29 19 - 32 mEq/L   Glucose, Bld 292 (H) 70 - 99 mg/dL   BUN 19 6 - 23 mg/dL   Creatinine, Ser 0.59 0.50 - 1.35 mg/dL   Calcium 9.5 8.4 - 10.5 mg/dL   GFR calc non Af Amer >90 >90 mL/min   GFR calc Af Amer >90 >90 mL/min    Comment: (NOTE) The eGFR has been calculated using the CKD EPI equation. This calculation has not been validated in all clinical situations. eGFR's persistently <90 mL/min signify possible Chronic Kidney Disease.    Anion gap 12 5 - 15  Glucose, capillary     Status: Abnormal   Collection Time: 11/22/14  7:50 AM  Result Value Ref Range   Glucose-Capillary 401 (H) 70 - 99 mg/dL  Glucose, capillary     Status: Abnormal   Collection Time: 11/22/14 11:58 AM  Result Value Ref Range   Glucose-Capillary 138 (H) 70 - 99 mg/dL  Glucose, capillary     Status: None   Collection Time: 11/22/14  2:32 PM  Result Value Ref Range   Glucose-Capillary 75 70 - 99 mg/dL   Labs are reviewed and are pertinent ftetrahydrocannabinol as per history and physical.  Current Facility-Administered Medications  Medication Dose Route Frequency Provider Last Rate Last Dose  . acetaminophen (TYLENOL) tablet 650 mg  650 mg Oral Q6H PRN Ivor Costa, MD      . clonazePAM [facility]) tablet 1 mg  1 mg Oral Q6H PRN Eugenie Filler, MD   1 mg at 11/22/14 0701  . doxepin (SINEQUAN) capsule 100 mg  100 mg Oral QHS Rocky Crafts Hammons, RPH   100 mg at 11/21/14 2213  . haloperidol (HALDOL) tablet 5 mg  5 mg Oral Q6H PRN Thurnell Lose, MD   5 mg at 11/21/14 1553   Or  . haloperidol lactate (HALDOL) injection 5 mg  5 mg Intramuscular Q6H PRN Thurnell Lose, MD   5 mg at 11/20/14 0920  . heparin injection 5,000 Units  5,000 Units Subcutaneous 3 times per day Ivor Costa, MD   5,000 Units at 11/22/14 1440  . insulin aspart (novoLOG) injection 0-20 Units  0-20 Units Subcutaneous TID WC Eugenie Filler, MD   3 Units at 11/22/14 1306  . insulin aspart (novoLOG) injection 0-5 Units  0-5 Units Subcutaneous QHS Eugenie Filler, MD   0 Units at 11/21/14 2200  . insulin detemir (LEVEMIR) injection 35 Units  35 Units Subcutaneous Daily Eugenie Filler, MD   35 Units at 11/22/14 1044  . QUEtiapine (SEROQUEL) tablet 100 mg  100 mg Oral BID Durward Parcel, MD   100 mg at 11/22/14 1044  . traMADol (ULTRAM) tablet 100 mg  100 mg Oral Q6H PRN Eugenie Filler, MD   100 mg at 11/22/14 1458    Psychiatric Specialty Exam: Physical Exam as per history and physical   ROS agitation, combative behavior, disorganized and paranoid delusions and noncompliant with the treatment   Blood pressure 90/59, pulse 124, temperature 98 F (  36.7 C), temperature source Oral, resp. rate 18, height _0  (1.854 m), weight 140 lb (63.504 kg), SpO2 98 %.Body mass index is 18.47 kg/(m^2).  General Appearance: Bizarre and Guarded  Eye Contact::  Good  Speech:  Clear and Coherent  Volume:  Normal  Mood:  Good   Affect:  Appropriate and Congruent  Thought Process:  Disorganized  Orientation:  Full (Time, Place, and Person)  Thought Content:  Denies hallucinations today   Suicidal Thoughts:  Denies today   Homicidal Thoughts: Denies today   Memory:  Immediate;   Fair Recent;   Fair  Judgement:  Impaired  Insight:  Lacking  Psychomotor Activity:  Increased  Concentration:  Fair  Recall:  Good  Fund of Knowledge:Good  Language: Good  Akathisia:  NA  Handed:  Right  AIMS (if indicated):     Assets:  Sales promotion account executive Intimacy Leisure Time Resilience Social Support  Sleep:      Musculoskeletal: Strength & Muscle Tone: within normal limits Gait & Station: normal Patient leans: N/A  Treatment Plan Summary: Daily contact with patient to assess and evaluate symptoms and progress in treatment Medication management Recommended continuing antipsychotic medication. I  see no reason for him to be prescribed Adderall particular given the addiction potential. He is already arranged follow-up at family services [facility] the [place] and can be discharged today  [place], [facility] 11/22/2014 4:04 PM

## 2014-11-22 NOTE — Discharge Summary (Signed)
Physician Discharge Summary  [name] JJO:[id number] DOB: February 24, 1986 DOA: 11/18/2014  PCP: Default, Provider, MD  Admit date: 11/18/2014 Discharge date: 11/22/2014  Time spent: 65 minutes  Recommendations for Outpatient Follow-up:  1. Follow-up with PCP in 1 week. On follow-up patient diabetes will need to be reassessed. 2. Follow-up with outpatient psychiatry within one week for further management of patient's schizophrenia and further medication recommendations.  Discharge Diagnoses:  Principal Problem:   DKA (diabetic ketoacidoses) Active Problems:   Schizophrenia, undifferentiated   Generalized anxiety disorder   Diabetes type 1, uncontrolled   Dehydration   Hip pain, bilateral   Involuntary commitment   Diabetic ketoacidosis without coma associated with diabetes mellitus due to underlying condition   Discharge Condition: Stable  Diet recommendation: Carb modified  Filed Weights   11/18/14 0323  Weight: 63.504 kg (140 lb)    History of present illness:  [name] is a 28 y.o. male with past medical history diabetes type 1, depression, schizophrenia, who presented with polydipsia and polyuria.  Patient reported that he had type 1 diabetes. He had not been taking his medications for more than a week. He developed polyuria and polydipsia. He also felt confused and dizzy. He did not have fever, chills, shortness of breath, chest pain. No nausea, vomiting or abdominal pain. He did not have dysuria or burning on urination.  Patient also reported having chronic bilateral hip pain, he pointed to the bilateral groin areas. The pain is aggravated by medially rotating the legs. He stated he had a steroid injection in the past with good relief.  Work up in the ED demonstrated elevated blood sugar level with anion gap of 18. UDS positive for THC, urinalysis negative for UTI, but with positive ketone. Patient was admitted to inpatient for further evaluation and  treatment.  Hospital Course:   #1 DKA/poorly controlled type 1 diabetes. Patient had presented with polydipsia and polyuria. It was noted that patient had been noncompliant with his medications for greater than a week. Patient also presented with dizziness and some confusion. Infectious workup which was undertaken was negative. Patient was noted in the emergency room to have a elevated blood sugar with anion gap of 18. UDS was positive for THC. Urine was also ketone positive. Patient was admitted to the step down unit and placed on the glucose stabilizer as well as IV fluids and followed. Patient's blood sugar levels improved and was subsequently transitioned to subcutaneous insulin. Patient was subsequently transferred to the floor. Patient's anion gap closed. Hemoglobin A1c is 11.5. Patient's Levemir was uptitrated to 35 units daily. Patient was also maintained on a sliding scale insulin. Patient will be discharged on Levemir. Patient is to follow-up with PCP as outpatient.   #2 schizophrenia with psychosis Patient with agitation and belligerent behavior during the hospitalization.. Patient with auditory hallucinations. Patient during the hospitalization was having delusional thoughts, paranoid ideation and disorganized behavior. Patient had been noncompliant in the past with antipsychotic medications. Patient did state that people were playing with his mind and talking to him about bad stuff and he felt like jumping out of the window and try to walk away from the hospital without proper procedure. Patient during the hospitalization was stopped by the staff as he tried to run away from the hospital when he was offered medication and then started to become combative, yelling screaming and trying to throw his hands and making threats to the staff. Patient had required soft restraints and chemical sedation. Patient was subsequently placed  in the IVC. Psychiatric consultation was obtained and was recommended  that patient's Adderall be discontinued as it increased psychosis. Was recommended patient be placed on Seroquel twice daily as well as Haldol and Klonopin as needed. Patient improved slowly became less combative and did not require any further soft restraints. Patient stated his auditory hallucinations had improved. Patient denied any homicidal or suicidal ideation. Psychiatry was asked to reassess the patient and patient was seen by Dr. Tenny Craw on 11/21/2014 and followed on 11/22/2014. It was recommended per psychiatry to continue patient on antipsychotic medication. It was felt patient's thought process was improving but his insight remained poor. It was felt that as patient no longer endorsed symptoms of paranoia, delusions, auditory or visual hallucinations or he was no longer further committable and may need to be discharged in spite his poor insight and noncompliance. Patient wanted to be discharged and a such patient be discharged on Seroquel twice daily. She is to follow-up with psychiatry as outpatient.   #3 bilateral hip pain Patient with complaints of hip pain on admission. X-rays negative for fracture or dislocation. MRI of the left hip negative for any acute abnormalities. Patient was placed on tramadol as needed for pain management. Outpatient follow-up.  #4 generalized anxiety disorder Continued on doxepin. Patient will need Psychiatric follow-up.  Procedures:  X-ray of bilateral hips 11/18/2014  MRI of left hip 11/18/2014    Consultations:  Psychiatry: Dr. Elsie Saas 11/19/2014/  Psychiatry: Dr. Tenny Craw 11/21/2014  Discharge Exam: Filed Vitals:   11/22/14 1252  BP: 90/59  Pulse: 124  Temp: 98 F (36.7 C)  Resp: 18    General: NAD Cardiovascular: RRR Respiratory: CTAB Abdomen: Soft, nontender, nondistended, positive bowel sounds.  Discharge Instructions You were cared for by a hospitalist during your hospital stay. If you have any questions about your discharge  medications or the care you received while you were in the hospital after you are discharged, you can call the unit and asked to speak with the hospitalist on call if the hospitalist that took care of you is not available. Once you are discharged, your primary care physician will handle any further medical issues. Please note that NO REFILLS for any discharge medications will be authorized once you are discharged, as it is imperative that you return to your primary care physician (or establish a relationship with a primary care physician if you do not have one) for your aftercare needs so that they can reassess your need for medications and monitor your lab values.  Discharge Instructions    Diet Carb Modified    Complete by:  As directed      Discharge instructions    Complete by:  As directed   [facility]. Follow up with PCP in 1 week.     Increase activity slowly    Complete by:  As directed           Current Discharge Medication List    START taking these medications   Details  doxepin (SINEQUAN) 100 MG capsule Take 1 capsule (100 mg total) by mouth at bedtime. Qty: 31 capsule, Refills: 0    insulin detemir (LEVEMIR) 100 UNIT/ML injection Inject 0.35 mLs (35 Units total) into the skin daily. Qty: 10 mL, Refills: 2    QUEtiapine (SEROQUEL) 100 MG tablet Take 1 tablet (100 mg total) by mouth 2 (two) times daily. Qty: 60 tablet, Refills: 0    traMADol (ULTRAM) 50 MG tablet Take 2  tablets (100 mg total) by mouth every 6 (six) hours as needed (Pain). Qty: 15 tablet, Refills: 0      STOP taking these medications     amphetamine-dextroamphetamine (ADDERALL) 20 MG tablet        Allergies  Allergen Reactions  . Sulfa Antibiotics Other (See Comments)    Unknown - Childhood   Follow-up Information    Follow up with [facility].   Specialty:  Professional Counselor   Why:  f/u as scheduled   Contact information:    [facility] [address] [place] [address] [phone]       Schedule an appointment as soon as possible for a visit in 1 week to follow up.   Why:  F/U with PCP in 1 week.       The results of significant diagnostics from this hospitalization (including imaging, microbiology, ancillary and laboratory) are listed below for reference.    Significant Diagnostic Studies: Dg Hip Bilateral W/pelvis  11/18/2014   CLINICAL DATA:  Chronic bilateral groin pain for several months. Initial encounter.  EXAM: BILATERAL HIP WITH PELVIS - 4+ VIEW  COMPARISON:  None.  FINDINGS: There is no evidence of fracture or dislocation. Both femoral heads are seated normally within their respective acetabula. No significant degenerative change is appreciated. The sacroiliac joints are unremarkable in appearance.  The visualized bowel gas pattern is grossly unremarkable in appearance.  IMPRESSION: No evidence of fracture or dislocation.   Electronically Signed   By: Roanna Raider M.D.   On: 11/18/2014 05:51   Mr Hip Left Wo Contrast  11/18/2014   CLINICAL DATA:  Bilateral hip pain.  EXAM: MR OF THE LEFT HIP WITHOUT CONTRAST  TECHNIQUE: Multiplanar, multisequence MR imaging was performed. No intravenous contrast was administered.  COMPARISON:  Radiographs dated 11/18/2014  FINDINGS: Bones: The osseous structures are normal.  Articular cartilage and labrum  Articular cartilage:  Normal.  Labrum:  Intact.  Joint or bursal effusion  Joint effusion:  None  Bursae:  Normal  Muscles and tendons  Muscles and tendons: The muscles and tendons around the left hip are normal. There is a focal area of abnormal edema in the right gluteus maximus muscle just posterior to the ischial tuberosity. This could be due to a recent injection or due to a partial tear of the muscle.  Other findings  Miscellaneous:  There are no pelvic masses.  There is no adenopathy.  IMPRESSION: 1. Normal MRI of the left hip.  2. Abnormality of the right gluteus maximus muscle which could be due to an intramuscular injection or to a focal muscle tear.   Electronically Signed   By: Geanie Cooley M.D.   On: 11/18/2014 12:47    Microbiology: Recent Results (from the past 240 hour(s))  Culture, blood (routine x 2)     Status: None (Preliminary result)   Collection Time: 11/18/14  5:00 AM  Result Value Ref Range Status   Specimen Description BLOOD ARM LEFT  Final   Special Requests BOTTLES DRAWN AEROBIC AND ANAEROBIC 10CC  Final   Culture  Setup Time   Final    11/18/2014 08:15 Performed at [facility]    Culture   Final           BLOOD CULTURE RECEIVED NO GROWTH TO DATE CULTURE WILL BE HELD FOR 5 DAYS BEFORE ISSUING A FINAL NEGATIVE REPORT Performed at [facility]    Report Status PENDING  Incomplete  Culture, blood (routine x 2)     Status: None (Preliminary result)   Collection Time: 11/18/14  5:12 AM  Result Value Ref Range Status   Specimen Description BLOOD HAND LEFT  Final   Special Requests BOTTLES DRAWN AEROBIC ONLY 10CC  Final   Culture  Setup Time   Final    11/18/2014 08:15 Performed at [facility]    Culture   Final           BLOOD CULTURE RECEIVED NO GROWTH TO DATE CULTURE WILL BE HELD FOR 5 DAYS BEFORE ISSUING A FINAL NEGATIVE REPORT Performed at [facility]    Report Status PENDING  Incomplete  MRSA PCR Screening     Status: None   Collection Time: 11/19/14 12:36 AM  Result Value Ref Range Status   MRSA by PCR NEGATIVE NEGATIVE Final    Comment:        The GeneXpert MRSA Assay (FDA approved for NASAL specimens only), is one component of a comprehensive MRSA colonization surveillance program. It is not intended to diagnose MRSA infection nor to guide or monitor treatment for MRSA infections.      Labs: Basic Metabolic Panel:  Recent Labs Lab 11/18/14 0512 11/19/14 0503 11/20/14 0641 11/21/14 0605 11/22/14 0645  NA 147 142 138 140 138   K 3.4* 3.8 4.0 4.4 4.2  CL 107 104 99 99 97  CO2 26 26 30 31 29   GLUCOSE 140* 104* 241* 222* 292*  BUN 13 10 9 11 19   CREATININE 0.61 0.47* 0.58 0.59 0.59  CALCIUM 8.5 8.7 8.9 9.8 9.5   Liver Function Tests:  Recent Labs Lab 11/17/14 1439 11/19/14 0503  AST 14 21  ALT 14 12  ALKPHOS 74 46  BILITOT 0.4 <0.2*  PROT 7.3 5.3*  ALBUMIN 4.8 3.4*   No results for input(s): LIPASE, AMYLASE in the last 168 hours. No results for input(s): AMMONIA in the last 168 hours. CBC:  Recent Labs Lab 11/17/14 1319 11/17/14 1439 11/17/14 1446 11/19/14 0503 11/20/14 0641  WBC  --  8.3  --  5.9 4.6  HGB 16.0 16.4 16.3 13.8 13.7  HCT 47.0 44.6 48.0 38.9* 39.6  MCV  --  87.3  --  87.2 86.7  PLT  --  297  --  209 203   Cardiac Enzymes: No results for input(s): CKTOTAL, CKMB, CKMBINDEX, TROPONINI in the last 168 hours. BNP: BNP (last 3 results) No results for input(s): PROBNP in the last 8760 hours. CBG:  Recent Labs Lab 11/21/14 1713 11/21/14 2205 11/22/14 0750 11/22/14 1158 11/22/14 1432  GLUCAP 291* 164* 401* 138* 75       Signed:  Elijan Googe MD [facility] 11/22/2014, 4:20 PM

## 2014-11-22 NOTE — Progress Notes (Signed)
Patient is endorsing he has been admitted to Psychiatric unit against his will. I am not sure how this patient has access to e-visits, much less to a computer as he is currently hospitalized.  I will send a note to the attending psychiatirst taking care of him.  Our chief operator for E-visits (Dr. Lovell Sheehan) has been made aware of this.

## 2014-11-24 LAB — CULTURE, BLOOD (ROUTINE X 2)
Culture: NO GROWTH
Culture: NO GROWTH

## 2014-11-25 ENCOUNTER — Inpatient Hospital Stay
Admit: 2014-11-25 | Discharge: 2014-11-25 | Disposition: A | Discharge status: Home / Self Care | Attending: Emergency Medicine

## 2014-11-25 DIAGNOSIS — R440 Auditory hallucinations: Secondary | ICD-10-CM

## 2014-11-25 LAB — POC CHEM8
Anion gap (POC): 19 mmol/L — ABNORMAL HIGH (ref 5–15)
BUN (POC): 24 MG/DL — ABNORMAL HIGH (ref 9–20)
CO2 (POC): 25 MMOL/L (ref 21–32)
Calcium, ionized (POC): 1.15 MMOL/L (ref 1.12–1.32)
Chloride (POC): 96 MMOL/L — ABNORMAL LOW (ref 98–107)
Creatinine (POC): 0.7 MG/DL (ref 0.6–1.3)
GFRAA, POC: >60 mL/min/{1.73_m2} (ref >60)
GFRNA, POC: >60 mL/min/{1.73_m2} (ref >60)
Glucose (POC): 343 MG/DL — ABNORMAL HIGH (ref 75–110)
Hematocrit (POC): 48 % (ref 36.6–50.3)
Hemoglobin (POC): 16.3 GM/DL (ref 12.1–17.0)
Potassium (POC): 4.2 MMOL/L (ref 3.5–5.1)
Sodium (POC): 134 MMOL/L — ABNORMAL LOW (ref 136–145)

## 2014-11-25 LAB — GLUCOSE, POC: Glucose (POC): 301 mg/dL — ABNORMAL HIGH (ref 65–100)

## 2014-11-25 MED ORDER — LORAZEPAM 2 MG/ML IJ SOLN
2 mg/mL | INTRAMUSCULAR | Status: AC
Start: 2014-11-25 — End: 2014-11-25
  Administered 2014-11-25: 08:00:00 via INTRAVENOUS

## 2014-11-25 MED ORDER — SODIUM CHLORIDE 0.9% BOLUS IV
0.9 % | Freq: Once | INTRAVENOUS | Status: AC
Start: 2014-11-25 — End: 2014-11-25
  Administered 2014-11-25: 08:00:00 via INTRAVENOUS

## 2014-11-25 MED FILL — SODIUM CHLORIDE 0.9 % IV: INTRAVENOUS | Qty: 2000

## 2014-11-25 MED FILL — LORAZEPAM 2 MG/ML IJ SOLN: 2 mg/mL | INTRAMUSCULAR | Qty: 1

## 2014-11-25 NOTE — ED Notes (Signed)
TRIAGE NOTE: Patient [place] by EMS [place] the bus station. [place]  and patient reports that his several year history of auditory hallucinations were overcoming him. Patient reports the hallucinations are not negative. Denies SI/HI. Patient denies drugs or alcohol. Patient is here seeking electro shock therapy.

## 2014-11-25 NOTE — ED Notes (Signed)
MD reviewed discharge instructions with patient. Opportunity for questions provided, patient verbalized understanding.

## 2014-11-25 NOTE — ED Provider Notes (Signed)
HPI Comments: 28 y.o. male with past medical history significant for Schizophrenia and DM who [place] bus station via EMS with chief complaint of auditory hallucination. Pt reports that he is "transient" and does not live in one place for very long. Pt states that he has been "dreading life right now" and that [place] [facility] [place] the [place] last residence. Pt reports that he has been experiencing a recurrent auditory hallucination for five years ("a bad little kid"). Pt further reports that he got off the bus to be seen in the [facility] ED to obtain "peace of mind" after exacerbated auditory hallucinations en route, and states is seeking ETC psychological procedure for his hallucinations. Pt notes SI and HI in "younger years," but denies current HI or SI. Pt states he has been rx'd Adderall for his Schizophrenia, and Levemir with Novolog for his DM from physicians in [place] and other various places. Pt reports last insulin was "2 or 3 days ago." Pt denies currently taking rx'd anti-psychotic medications. There are no other acute medical concerns at this time.    Social hx: -EtOH, -drugs   PCP: None    Note written by Rayetta Piggyan M. Johnson, Scribe, as dictated by Lanell Personsara Kamber Vignola, MD 1:58 AM    The history is provided by the patient. No language interpreter was used.        Past Medical History:   Diagnosis Date   ??? Schizophrenia ([name])        No past surgical history on file.      Family History:   Problem Relation Age of Onset   ??? Family history unknown: Yes       History     Social History   ??? Marital Status: SINGLE     Spouse Name: N/A     Number of Children: N/A   ??? Years of Education: N/A     Occupational History   ??? Not on file.     Social History Main Topics   ??? Smoking status: Current Every Day Smoker -- 1.00 packs/day   ??? Smokeless tobacco: Not on file   ??? Alcohol Use: Yes   ??? Drug Use: Not on file   ??? Sexual Activity: Not on file      Other Topics Concern   ??? Not on file     Social History Narrative   ??? No narrative on file                ALLERGIES: Sulfa (sulfonamide antibiotics)      Review of Systems   Constitutional: Negative for fever and chills.   HENT: Negative for congestion.    Eyes: Negative for visual disturbance.   Respiratory: Negative for cough and shortness of breath.    Cardiovascular: Negative for chest pain.   Gastrointestinal: Negative for nausea and abdominal pain.   Endocrine: Negative for polyuria.   Genitourinary: Negative for dysuria.   Neurological: Negative for headaches.   Psychiatric/Behavioral: Positive for hallucinations (auditory). Negative for suicidal ideas and dysphoric mood.        -HI   All other systems reviewed and are negative.      Filed Vitals:    11/25/14 0142   BP: 138/92   Pulse: 120   Temp: 98.4 ??F (36.9 ??C)   Resp: 18   Height: 6\' 1"  (1.854 m)   Weight: 63.504 kg (140 lb)   SpO2: 99%  Physical Exam   Constitutional: He is oriented to person, place, and time. He appears well-developed and well-nourished.   HENT:   Head: Normocephalic and atraumatic.   Nose: Nose normal.   Eyes: Conjunctivae and EOM are normal. Pupils are equal, round, and reactive to light.   Neck: Normal range of motion. Neck supple.   Cardiovascular: Normal rate, regular rhythm, normal heart sounds and intact distal pulses.    Pulmonary/Chest: Effort normal and breath sounds normal.   Abdominal: Soft. Bowel sounds are normal.   Musculoskeletal: Normal range of motion.   Neurological: He is oriented to person, place, and time. He has normal reflexes.   Skin: Skin is warm and dry.   Nursing note and vitals reviewed.  Note written by Rayetta Piggyan M. Johnson, Scribe, as dictated by Lanell Personsara Kristof Nadeem, MD 2:16 AM    MDM    Procedures     POC chem 8 without acidosis.  Fluids/ativan given with decrease in heart rate. Patient will not be admitted by  Psych. He would like to leave and go back to bus stop.

## 2014-11-25 NOTE — ED Notes (Signed)
Assumed care of patient from Adrianne,RN. Patient resting on stretcher, NAD noted at this time. Bed low and locked, call bell in reach. Remains in view of nurses station. Patient continues to speak to himself. Will continue to monitor.

## 2014-11-25 NOTE — Other (Addendum)
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  The Medical Doctor is in agreement with Psychiatrist disposition because of (reason) Pt does not warrant admission at this time per Dr. Latanya Maudlinahlvani.  The plan is D/C home  The on-call Psychiatrist consulted was Dr. Latanya Maudlinahlvani.      Section II - Integrated Summary  Summary:   Pt presents to the ER from the bus station.  Pt is by choice transient and  [place] [facility] [place]   He had called the secret service to request assistance with ECT.  The secret service had him taken [place] the bus [place] and he presents to ER for assessment.  PT has a baseline of A/Hallucinations " They don't bther me they just talk with me"  He reports he is ready to have them leave hima nd ECT is the only thing he has not tried.  He wants ECT and no other mental health treatment.  Explained to PT how admissions work and assessment for ECT.  He reports he is not interested in this mental health admission.  He denies SI/HI.  [place]  He is alert and oriented,ADLS WNL, dressed appropriately.  He is currently only taking Adderal as a mental health medication.  TC to Dr. Latanya Maudlinahlvani- d/c home f/u given    The patienthas demonstrated mental capacity to provide informed consent.  The information is given by the patient.  The Chief Complaint is wants ECT.  The Precipitant Factors are lack of structure  Previous Hospitalizations: numerous  The patient has not previously been in restraints.  Current Psychiatrist and/or Case Manager is NA.    Lethality Assessment:    The potential for suicide noted by the following:not noted  The patient has not been a perpetrator of sexual or physical abuse.  There are not pending charges.   The patient is not felt to be at risk for self harm or harm to others.  The attending nurse was advised that security has not been notified.    Section III - Psychosocial  The patient's overall mood and attitude is WNL.  Feelings of helplessness and hopelessness are not observed.  Generalized anxiety is not observed.  Panic is not observed. Phobias are not observed.  Obsessive compulsive tendencies are not observed.      Section IV - Mental Status Exam  The patient's appearance shows no evidence of impairment.  The patient's behavior is manic . The patient is oriented to time, place, person and situation.  The patient's speech is pressured.  The patient's mood is euthymic and is depressed.  The range of affect shows no evidence of impairment.  The patient's thought content demonstrates delusions.  The thought process is tangential.  The patient's perception demonstrated changes in the following:  auditory  hallucinations. The patient's memory shows no evidence of impairment.  The patient's appetite shows no evidence of impairment.  The patient's sleep shows no evidence of impairment. The patient shows no insight.  The patient's judgement is psychologically impaired.                  Section V -  Substance Abuse  The patient is using substances.  The patient is using other substances orally for greater than 10 years with last use on today. The patient has experienced the following withdrawal symptoms: sleep disturbance and N/A.      Section VI - Living Arrangements  The patient is single.  The patient lives alone. The patient has no children.  The patient does plan to return home upon discharge.  The patient does not have legal issues pending. The patient's source of income comes from social security and family.  Religious and cultural practices have not been voiced at this time.    The patient's greatest support comes from mother and this person will not be involved with the treatment.     The patient has not been in an event described as horrible or outside the realm of ordinary life experience either currently or in the past.  The patient has not been a victim of sexual/physical abuse.    Section VII - Other Areas of Clinical Concern  The highest grade achieved is unk with the overall quality of school experience being described as unk.  The patient is currently unemployed and speaks [place] as a primary language.  The patient has no communication impairments affecting communication. The patient's preference for learning can be described as: can read and write adequately.  The patient's hearing is normal.  The patient's vision is normal.      Maryln ManuelJean M Todd, LCSW

## 2014-11-25 NOTE — ED Notes (Signed)
Upon providing discharge paperwork to patient patient questioning "which is your most prestigious hospital in the area?". Patient provided with some names of hospitals in the area. Patient continuing to request Electro convulsive therapy. Encouraged patient to follow up to establish psychiatric care to be able seek the help he needs; patient continuing to request names of hospitals in the area.

## 2014-12-15 ENCOUNTER — Emergency Department (HOSPITAL_COMMUNITY)
Admission: EM | Admit: 2014-12-15 | Discharge: 2014-12-15 | Disposition: A | Discharge status: Home / Self Care | Payer: Medicare Other | Attending: Emergency Medicine | Admitting: Emergency Medicine

## 2014-12-15 ENCOUNTER — Encounter (HOSPITAL_COMMUNITY): Payer: Self-pay | Admitting: *Deleted

## 2014-12-15 DIAGNOSIS — F329 Major depressive disorder, single episode, unspecified: Secondary | ICD-10-CM | POA: Diagnosis not present

## 2014-12-15 DIAGNOSIS — Y998 Other external cause status: Secondary | ICD-10-CM | POA: Insufficient documentation

## 2014-12-15 DIAGNOSIS — Z79899 Other long term (current) drug therapy: Secondary | ICD-10-CM | POA: Diagnosis not present

## 2014-12-15 DIAGNOSIS — Y9289 Other specified places as the place of occurrence of the external cause: Secondary | ICD-10-CM | POA: Insufficient documentation

## 2014-12-15 DIAGNOSIS — Y9389 Activity, other specified: Secondary | ICD-10-CM | POA: Insufficient documentation

## 2014-12-15 DIAGNOSIS — F209 Schizophrenia, unspecified: Secondary | ICD-10-CM | POA: Insufficient documentation

## 2014-12-15 DIAGNOSIS — Z794 Long term (current) use of insulin: Secondary | ICD-10-CM | POA: Insufficient documentation

## 2014-12-15 DIAGNOSIS — X58XXXA Exposure to other specified factors, initial encounter: Secondary | ICD-10-CM | POA: Diagnosis not present

## 2014-12-15 DIAGNOSIS — Z8739 Personal history of other diseases of the musculoskeletal system and connective tissue: Secondary | ICD-10-CM | POA: Diagnosis not present

## 2014-12-15 DIAGNOSIS — Z8719 Personal history of other diseases of the digestive system: Secondary | ICD-10-CM | POA: Insufficient documentation

## 2014-12-15 DIAGNOSIS — K088 Other specified disorders of teeth and supporting structures: Secondary | ICD-10-CM | POA: Diagnosis present

## 2014-12-15 DIAGNOSIS — S01512A Laceration without foreign body of oral cavity, initial encounter: Secondary | ICD-10-CM | POA: Diagnosis not present

## 2014-12-15 DIAGNOSIS — G8929 Other chronic pain: Secondary | ICD-10-CM | POA: Insufficient documentation

## 2014-12-15 DIAGNOSIS — E1065 Type 1 diabetes mellitus with hyperglycemia: Secondary | ICD-10-CM | POA: Insufficient documentation

## 2014-12-15 DIAGNOSIS — Z720 Tobacco use: Secondary | ICD-10-CM | POA: Diagnosis not present

## 2014-12-15 LAB — BASIC METABOLIC PANEL
Anion gap: 7 (ref 5–15)
BUN: 14 mg/dL (ref 6–23)
CALCIUM: 9 mg/dL (ref 8.4–10.5)
CO2: 26 mmol/L (ref 19–32)
Chloride: 101 mEq/L (ref 96–112)
Creatinine, Ser: 0.63 mg/dL (ref 0.50–1.35)
GFR calc Af Amer: >90 mL/min (ref >90)
Glucose, Bld: 352 mg/dL — ABNORMAL HIGH (ref 70–99)
POTASSIUM: 4 mmol/L (ref 3.5–5.1)
SODIUM: 134 mmol/L — AB (ref 135–145)

## 2014-12-15 LAB — CBG MONITORING, ED: Glucose-Capillary: 358 mg/dL — ABNORMAL HIGH (ref 70–99)

## 2014-12-15 MED ORDER — LIDOCAINE VISCOUS 2 % MT SOLN
15.0000 mL | Freq: Once | OROMUCOSAL | Status: AC
  Administered 2014-12-15: 15 mL via OROMUCOSAL
  Filled 2014-12-15: qty 15

## 2014-12-15 MED ORDER — BUPIVACAINE HCL 0.5 % IJ SOLN
5.0000 mL | Freq: Once | INTRAMUSCULAR | Status: DC
  Filled 2014-12-15: qty 5

## 2014-12-15 MED ORDER — ACETAMINOPHEN 500 MG PO TABS
500.0000 mg | ORAL_TABLET | Freq: Once | ORAL | Status: AC
  Administered 2014-12-15: 500 mg via ORAL
  Filled 2014-12-15: qty 1

## 2014-12-15 MED ORDER — IBUPROFEN 400 MG PO TABS
600.0000 mg | ORAL_TABLET | Freq: Once | ORAL | Status: AC
  Administered 2014-12-15: 600 mg via ORAL
  Filled 2014-12-15 (×2): qty 1

## 2014-12-15 MED ORDER — NAPROXEN 500 MG PO TABS: 500.0000 mg | ORAL_TABLET | Freq: Two times a day (BID) | ORAL | Status: DC

## 2014-12-15 MED ORDER — BUPIVACAINE HCL (PF) 0.5 % IJ SOLN
5.0000 mL | Freq: Once | INTRAMUSCULAR | Status: AC
  Administered 2014-12-15: 5 mL
  Filled 2014-12-15: qty 10

## 2014-12-15 NOTE — ED Notes (Signed)
Pt. Out in hallways requesting [place] sandwich and ice cream and demanding pain medication. RN explained provider is working as hard as he can and has signed up and will see him as shortly and pt can get food afterwards. Pt agreeable to plan at present time and went back to room.

## 2014-12-15 NOTE — Discharge Instructions (Signed)
Tongue Laceration  A tongue laceration is a cut on the tongue. Over the next 1 to 2 days, you will see that the wound edges appear gray in color. The edges may appear ragged and slightly spread apart. Because of all the normal bacteria in the mouth, these wounds are contaminated, but this is not an infection that needs antibiotics. Most wounds heal with no problems despite their appearance. TREATMENT  Most tongue lacerations only go partway through the tongue. This type of injury generally does not need stitches (sutures). More serious injuries penetrate the tongue deeper and may need sutures and follow-up care. HOME CARE INSTRUCTIONS   Cover an ice cube in a thin cloth and hold it directly on the cut for 1 to 3 minutes at a time, 6 to 10 times per day. Do this for 1 day. This can help reduce pain and swelling.  After the first day, rinse the mouth with a warm, saltwater wash 4 to 6 times per day, or as your caregiver instructs.  If there are no injuries to your teeth, continue oral hygiene and gentle brushing. Do not brush loose or broken teeth or teeth that have been put back into normal position by your caregiver.  Large or complex cuts may require antibiotics to prevent infection. Take your antibiotics as directed. Finish them even if you start to feel better.  Do not eat or drink hot food or beverages while your mouth is still numb.  Do not eat hard foods (such as apples) or chewy foods (such as broiled meat) until your caregiver advises you otherwise.  If your caregiver used sutures to repair the cut, do not pull or chew them. If you do this, they will gradually loosen and may become untied.  Only take over-the-counter or prescription medicines for pain, discomfort, or fever as directed by your caregiver. You may need a tetanus shot if:  You cannot remember when you had your last tetanus shot.  You have never had a tetanus shot. If you get a tetanus shot, your arm may swell, get red,  and feel warm to the touch. This is common and not a problem. If you need a tetanus shot and you choose not to have one, there is a rare chance of getting tetanus. Sickness from tetanus can be serious. SEEK IMMEDIATE MEDICAL CARE IF:   You develop swelling or increasing pain in the wound or in other parts of your mouth or face.  You see pus coming from the wound. Some drainage in the mouth is normal.  You have a fever.  You notice the edges of the wound break open after sutures have been removed.  You develop bleeding that does not stop when you apply pressure.  You develop any breathing problems. MAKE SURE YOU:  Understand these instructions.  Will watch your condition.  Will get help right away if you are not doing well or get worse. Document Released: 12/04/2006 Document Revised: 02/26/2012 Document Reviewed: 06/08/2011 [facility] Patient Information 2015 [place], [place]. This information is not intended to replace advice given to you by your health care provider. Make sure you discuss any questions you have with your health care provider.  Blood Glucose Monitoring Monitoring your blood glucose (also know as blood sugar) helps you to manage your diabetes. It also helps you and your health care provider monitor your diabetes and determine how well your treatment plan is working. WHY SHOULD YOU MONITOR YOUR BLOOD GLUCOSE?  It can help you understand how  food, exercise, and medicine affect your blood glucose.  It allows you to know what your blood glucose is at any given moment. You can quickly tell if you are having low blood glucose (hypoglycemia) or high blood glucose (hyperglycemia).  It can help you and your health care provider know how to adjust your medicines.  It can help you understand how to manage an illness or adjust medicine for exercise. WHEN SHOULD YOU TEST? Your health care provider will help you decide how often you should check your blood glucose. This may depend  on the type of diabetes you have, your diabetes control, or the types of medicines you are taking. Be sure to write down all of your blood glucose readings so that this information can be reviewed with your health care provider. See below for examples of testing times that your health care provider may suggest. Type 1 Diabetes  Test 4 times a day if you are in good control, using an insulin pump, or perform multiple daily injections.  If your diabetes is not well controlled or if you are sick, you may need to monitor more often.  It is a good idea to also monitor:  Before and after exercise.  Between meals and 2 hours after a meal.  Occasionally between 2:00 a.m. and 3:00 a.m. Type 2 Diabetes  It can vary with each person, but generally, if you are on insulin, test 4 times a day.  If you take medicines by mouth (orally), test 2 times a day.  If you are on a controlled diet, test once a day.  If your diabetes is not well controlled or if you are sick, you may need to monitor more often. HOW TO MONITOR YOUR BLOOD GLUCOSE Supplies Needed  Blood glucose meter.  Test strips for your meter. Each meter has its own strips. You must use the strips that go with your own meter.  A pricking needle (lancet).  A device that holds the lancet (lancing device).  A journal or log book to write down your results. Procedure  Wash your hands with soap and water. Alcohol is not preferred.  Prick the side of your finger (not the tip) with the lancet.  Gently milk the finger until a small drop of blood appears.  Follow the instructions that come with your meter for inserting the test strip, applying blood to the strip, and using your blood glucose meter. Other Areas to Get Blood for Testing Some meters allow you to use other areas of your body (other than your finger) to test your blood. These areas are called alternative sites. The most common alternative sites are:  The forearm.  The  thigh.  The back area of the lower leg.  The palm of the hand. The blood flow in these areas is slower. Therefore, the blood glucose values you get may be delayed, and the numbers are different from what you would get from your fingers. Do not use alternative sites if you think you are having hypoglycemia. Your reading will not be accurate. Always use a finger if you are having hypoglycemia. Also, if you cannot feel your lows (hypoglycemia unawareness), always use your fingers for your blood glucose checks. ADDITIONAL TIPS FOR GLUCOSE MONITORING  Do not reuse lancets.  Always carry your supplies with you.  All blood glucose meters have a 24-hour "hotline" number to call if you have questions or need help.  Adjust (calibrate) your blood glucose meter with a control solution after finishing  a few boxes of strips. BLOOD GLUCOSE RECORD KEEPING It is a good idea to keep a daily record or log of your blood glucose readings. Most glucose meters, if not all, keep your glucose records stored in the meter. Some meters come with the ability to download your records to your home computer. Keeping a record of your blood glucose readings is especially helpful if you are wanting to look for patterns. Make notes to go along with the blood glucose readings because you might forget what happened at that exact time. Keeping good records helps you and your health care provider to work together to achieve good diabetes management.  Document Released: 12/07/2003 Document Revised: 04/20/2014 Document Reviewed: 04/28/2013 [facility] Patient Information 2015 [place], [place]. This information is not intended to replace advice given to you by your health care provider. Make sure you discuss any questions you have with your health care provider. Diabetes and Exercise Exercising regularly is important. It is not just about losing weight. It has many health benefits, such as:  Improving your overall fitness, flexibility, and  endurance.  Increasing your bone density.  Helping with weight control.  Decreasing your body fat.  Increasing your muscle strength.  Reducing stress and tension.  Improving your overall health. People with diabetes who exercise gain additional benefits because exercise:  Reduces appetite.  Improves the body's use of blood sugar (glucose).  Helps lower or control blood glucose.  Decreases blood pressure.  Helps control blood lipids (such as cholesterol and triglycerides).  Improves the body's use of the hormone insulin by:  Increasing the body's insulin sensitivity.  Reducing the body's insulin needs.  Decreases the risk for heart disease because exercising:  Lowers cholesterol and triglycerides levels.  Increases the levels of good cholesterol (such as high-density lipoproteins [HDL]) in the body.  Lowers blood glucose levels. YOUR ACTIVITY PLAN  Choose an activity that you enjoy and set realistic goals. Your health care provider or diabetes educator can help you make an activity plan that works for you. Exercise regularly as directed by your health care provider. This includes:  Performing resistance training twice a week such as push-ups, sit-ups, lifting weights, or using resistance bands.  Performing 150 minutes of cardio exercises each week such as walking, running, or playing sports.  Staying active and spending no more than 90 minutes at one time being inactive. Even short bursts of exercise are good for you. Three 10-minute sessions spread throughout the day are just as beneficial as a single 30-minute session. Some exercise ideas include:  Taking the dog for a walk.  Taking the stairs instead of the elevator.  Dancing to your favorite song.  Doing an exercise video.  Doing your favorite exercise with a friend. RECOMMENDATIONS FOR EXERCISING WITH TYPE 1 OR TYPE 2 DIABETES   Check your blood glucose before exercising. If blood glucose levels are  greater than 240 mg/dL, check for urine ketones. Do not exercise if ketones are present.  Avoid injecting insulin into areas of the body that are going to be exercised. For example, avoid injecting insulin into:  The arms when playing tennis.  The legs when jogging.  Keep a record of:  Food intake before and after you exercise.  Expected peak times of insulin action.  Blood glucose levels before and after you exercise.  The type and amount of exercise you have done.  Review your records with your health care provider. Your health care provider will help you to develop guidelines for  adjusting food intake and insulin amounts before and after exercising.  If you take insulin or oral hypoglycemic agents, watch for signs and symptoms of hypoglycemia. They include:  Dizziness.  Shaking.  Sweating.  Chills.  Confusion.  Drink plenty of water while you exercise to prevent dehydration or heat stroke. Body water is lost during exercise and must be replaced.  Talk to your health care provider before starting an exercise program to make sure it is safe for you. Remember, almost any type of activity is better than none. Document Released: 02/24/2004 Document Revised: 04/20/2014 Document Reviewed: 05/13/2013 [facility] Patient Information 2015 [place], [place]. This information is not intended to replace advice given to you by your health care provider. Make sure you discuss any questions you have with your health care provider.

## 2014-12-15 NOTE — ED Notes (Signed)
Will PA at bedside.  

## 2014-12-15 NOTE — ED Provider Notes (Signed)
CSN: [id number]     Arrival date & time 12/15/14  1248 History   First MD Initiated Contact with Patient 12/15/14 1717     Chief Complaint  Patient presents with  . Dental Pain   [name] is a 28 y.o. male with history of diabetes, schizoaffective disorder, polysubstance abuse, chronic pain, and medical noncompliance who presents the emergency department complaining of a cut to his tongue and requesting a refill on Adderall. Patient reports he cut his right side of his tongue on a 2 the last 24 hours. Patient is complaining 10 out of 10 pain. Patient reports he's tried heat and ice without relief. Patient reports he's been tolerating by mouth liquids and food well. Patient is requesting a refill on Adderall. The patient reports he does not have insulin at home because he cannot afford it. Patient reports his sugars have been normal because he's been eating well. Patient denies any illicit drug use recently. The patient denies suicidal or homicidal ideations. Patient denies fevers, chills, trouble swallowing, throat swelling, abdominal pain, nausea, vomiting, or discharge from his mouth.  (Consider location/radiation/quality/duration/timing/severity/associated sxs/prior Treatment) HPI  Past Medical History  Diagnosis Date  . Diabetes mellitus   . Depression   . Schizo affective schizophrenia   . Polysubstance abuse   . Scoliosis   . Chronic pain   . Noncompliance with medication regimen   . DKA (diabetic ketoacidoses) 11/18/2014  . GERD (gastroesophageal reflux disease)    Past Surgical History  Procedure Laterality Date  . I&d extremity  12/19/2011    Procedure: IRRIGATION AND DEBRIDEMENT EXTREMITY;  Surgeon: Sharma Covert;  Location: [facility] OR;  Service: Orthopedics;  Laterality: Left;   Family History  Problem Relation Age of Onset  . Diabetes Mother    History  Substance Use Topics  . Smoking status: Current Some Day Smoker -- 0.50 packs/day  . Smokeless tobacco: Never Used   . Alcohol Use: Yes     Comment: occ    Review of Systems  Constitutional: Negative for fever and chills.  HENT: Negative for congestion, ear pain, facial swelling, sinus pressure, sore throat and trouble swallowing.        Tongue pain  Eyes: Negative for pain and visual disturbance.  Respiratory: Negative for cough, shortness of breath and wheezing.   Cardiovascular: Negative for chest pain and palpitations.  Gastrointestinal: Negative for nausea, vomiting, abdominal pain and diarrhea.  Genitourinary: Negative for dysuria.  Musculoskeletal: Negative for back pain and neck pain.  Skin: Negative for rash.  Neurological: Negative for dizziness, weakness, numbness and headaches.  Psychiatric/Behavioral: Negative for suicidal ideas and self-injury.  All other systems reviewed and are negative.     Allergies  Sulfa antibiotics  Home Medications   Prior to Admission medications   Medication Sig Start Date End Date Taking? Authorizing Provider  amphetamine-dextroamphetamine (ADDERALL) 20 MG tablet Take 20 mg by mouth 2 (two) times daily.   Yes Historical Provider, MD  doxepin (SINEQUAN) 100 MG capsule Take 1 capsule (100 mg total) by mouth at bedtime. 11/22/14  Yes Rodolph Bong, MD  insulin aspart (NOVOLOG) 100 UNIT/ML injection Inject 10 Units into the skin 3 (three) times daily before meals. Per sliding scale   Yes Historical Provider, MD  insulin detemir (LEVEMIR) 100 UNIT/ML injection Inject 0.35 mLs (35 Units total) into the skin daily. 11/22/14  Yes Rodolph Bong, MD  doxepin (SINEQUAN) 10 MG/ML solution Take 10 mLs by mouth at bedtime. 11/29/14   Historical Provider,  MD  naproxen (NAPROSYN) 500 MG tablet Take 1 tablet (500 mg total) by mouth 2 (two) times daily with a meal. 12/15/14   Einar Gip Suhaila Troiano, PA-C  QUEtiapine (SEROQUEL) 100 MG tablet Take 1 tablet (100 mg total) by mouth 2 (two) times daily. Patient not taking: Reported on 12/15/2014 11/22/14   Rodolph Bong, MD  traMADol (ULTRAM) 50 MG tablet Take 2 tablets (100 mg total) by mouth every 6 (six) hours as needed (Pain). Patient not taking: Reported on 12/15/2014 11/22/14   Rodolph Bong, MD   BP 124/94 mmHg  Pulse 98  Temp(Src) 97.8 F (36.6 C) (Oral)  Resp 98  Ht 6' (1.829 m)  Wt 145 lb (65.772 kg)  BMI 19.66 kg/m2  SpO2 100% Physical Exam  Constitutional: He is oriented to person, place, and time. He appears well-developed and well-nourished. No distress.  HENT:  Head: Normocephalic and atraumatic.  Right Ear: External ear normal.  Left Ear: External ear normal.  Nose: Nose normal.  Mouth/Throat: Oropharynx is clear and moist. No oropharyngeal exudate.  Patient has a very small laceration to his right side of his tongue that is not bleeding. The patient has poor dentition.  Eyes: Conjunctivae are normal. Pupils are equal, round, and reactive to light. Right eye exhibits no discharge. Left eye exhibits no discharge.  Neck: Neck supple.  Cardiovascular: Normal rate, regular rhythm, normal heart sounds and intact distal pulses.  Exam reveals no gallop and no friction rub.   No murmur heard. Heart rate 92  Pulmonary/Chest: Effort normal and breath sounds normal. No respiratory distress. He has no wheezes. He has no rales.  Abdominal: Soft. He exhibits no distension. There is no tenderness.  Musculoskeletal: He exhibits no edema.  Lymphadenopathy:    He has no cervical adenopathy.  Neurological: He is alert and oriented to person, place, and time. Coordination normal.  Skin: Skin is warm and dry. No rash noted. He is not diaphoretic. No erythema. No pallor.  Psychiatric: He has a normal mood and affect. His behavior is normal. He expresses no homicidal and no suicidal ideation.  Patient is highly irritable. He denies homicidal or suicidal ideations. He is not responding to external stimuli.  Nursing note and vitals reviewed.   ED Course  Procedures (including critical  care time) Labs Review Labs Reviewed  BASIC METABOLIC PANEL - Abnormal; Notable for the following:    Sodium 134 (*)    Glucose, Bld 352 (*)    All other components within normal limits  CBG MONITORING, ED - Abnormal; Notable for the following:    Glucose-Capillary 358 (*)    All other components within normal limits    Imaging Review No results found.   EKG Interpretation None     Filed Vitals:   12/15/14 1830 12/15/14 1900 12/15/14 1930 12/15/14 2031  BP: 127/87 134/95 139/88 124/94  Pulse: 101 98 103 98  Temp:    97.8 F (36.6 C)  TempSrc:    Oral  Resp:   16 98  Height:      Weight:      SpO2: 99% 99% 96% 100%    MDM   Meds given in ED:  Medications  ibuprofen (ADVIL,MOTRIN) tablet 600 mg (600 mg Oral Given 12/15/14 1328)  lidocaine (XYLOCAINE) 2 % viscous mouth solution 15 mL (15 mLs Mouth/Throat Given 12/15/14 1757)  acetaminophen (TYLENOL) tablet 500 mg (500 mg Oral Given 12/15/14 1757)  bupivacaine (MARCAINE) 0.5 % injection 5 mL (5 mLs  Infiltration Given by Other 12/15/14 2116)    Discharge Medication List as of 12/15/2014  9:13 PM    START taking these medications   Details  naproxen (NAPROSYN) 500 MG tablet Take 1 tablet (500 mg total) by mouth 2 (two) times daily with a meal., Starting 12/15/2014, Until Discontinued, Print        Final diagnoses:  Tongue laceration, initial encounter  Type 1 diabetes mellitus with hyperglycemia   Patient presented to the emergency department complaining out of 10 out of 10 pain after cutting his time with his tooth earlier today. He is also requesting refill on his Adderall prescription. The patient is given viscous lidocaine and Tylenol in the ED for pain control. Patient has a history of type 1 diabetes that he says is well controlled. The patient does have glucose of 352. Patient is not in DKA. The patient was given water to drink and to adhere to his insulin regimens. The patient verbalizes that he will  follow-up with primary care and does not wish for any prescriptions for insulin here. Patient is afebrile and nontoxic appearing. Had reevaluation the patient is requesting to be evaluated by a physician as he is not happy with viscous lidocaine and Tylenol for pain control. Dr. Juleen China evaluated this patient and performed a tongue block on this patient. I advised patient that the emergency department does not refill prescriptions for Adderall. I advised patient that he needs to follow-up with his primary care provider or the [facility] for his diabetes and adderall. The patient was provided a Pharmacist, hospital. Advised the patient to return to the emergency department with new or worsening symptoms or new concerns. The patient verbalized understanding and agreement with plan.  This patient was discussed with and evaluated by Dr. Juleen China who agrees with assessment and plan.     Lawana Chambers, PA-C 12/16/14 9326  Raeford Razor, MD 12/17/14 [phone]

## 2014-12-15 NOTE — ED Notes (Addendum)
Patient states he has been out in the cold.  He states he had some swelling to the right side of his neck on yesterday and feels that he may have a cut on the right side of his tongue.  Patient has pain when swallowing due to injury to his tongue.  Patient denies any fevers,    He is also requesting a refill of adderall   He is staying [place] a friend [facility] [address]

## 2014-12-15 NOTE — ED Notes (Signed)
Pt called out multiple times for pain medication RN spoke to patient about wait times and that PA is working as fast as he can. RN apologetic and offering ice chips to numb tongue. Pt yelling profanity's and then apologetic. Pt has flight of ideas and difficulty maintaining thoughts. Pt. Exhibiting drug seeking behaviors.

## 2014-12-21 ENCOUNTER — Encounter (HOSPITAL_COMMUNITY): Payer: Self-pay | Admitting: Nurse Practitioner

## 2014-12-21 ENCOUNTER — Observation Stay (HOSPITAL_COMMUNITY)
Admission: EM | Admit: 2014-12-21 | Discharge: 2014-12-22 | Disposition: A | Discharge status: Home / Self Care | Payer: PPO | Attending: Internal Medicine | Admitting: Internal Medicine

## 2014-12-21 DIAGNOSIS — M419 Scoliosis, unspecified: Secondary | ICD-10-CM | POA: Insufficient documentation

## 2014-12-21 DIAGNOSIS — F329 Major depressive disorder, single episode, unspecified: Secondary | ICD-10-CM | POA: Diagnosis not present

## 2014-12-21 DIAGNOSIS — G8929 Other chronic pain: Secondary | ICD-10-CM | POA: Diagnosis not present

## 2014-12-21 DIAGNOSIS — E1065 Type 1 diabetes mellitus with hyperglycemia: Secondary | ICD-10-CM | POA: Diagnosis not present

## 2014-12-21 DIAGNOSIS — Z590 Homelessness: Secondary | ICD-10-CM | POA: Diagnosis not present

## 2014-12-21 DIAGNOSIS — R531 Weakness: Secondary | ICD-10-CM | POA: Insufficient documentation

## 2014-12-21 DIAGNOSIS — F259 Schizoaffective disorder, unspecified: Secondary | ICD-10-CM | POA: Diagnosis not present

## 2014-12-21 DIAGNOSIS — Z9114 Patient's other noncompliance with medication regimen: Secondary | ICD-10-CM | POA: Insufficient documentation

## 2014-12-21 DIAGNOSIS — F17210 Nicotine dependence, cigarettes, uncomplicated: Secondary | ICD-10-CM | POA: Diagnosis not present

## 2014-12-21 DIAGNOSIS — Z598 Other problems related to housing and economic circumstances: Secondary | ICD-10-CM | POA: Diagnosis not present

## 2014-12-21 DIAGNOSIS — Z794 Long term (current) use of insulin: Secondary | ICD-10-CM | POA: Insufficient documentation

## 2014-12-21 DIAGNOSIS — R739 Hyperglycemia, unspecified: Secondary | ICD-10-CM

## 2014-12-21 DIAGNOSIS — K219 Gastro-esophageal reflux disease without esophagitis: Secondary | ICD-10-CM | POA: Diagnosis not present

## 2014-12-21 HISTORY — DX: Unspecified osteoarthritis, unspecified site: M19.90

## 2014-12-21 LAB — I-STAT CHEM 8, ED
BUN: 19 mg/dL (ref 6–23)
CHLORIDE: 92 meq/L — AB (ref 96–112)
Calcium, Ion: 1.11 mmol/L — ABNORMAL LOW (ref 1.12–1.23)
Creatinine, Ser: 1 mg/dL (ref 0.50–1.35)
Glucose, Bld: 695 mg/dL (ref 70–99)
HEMATOCRIT: 45 % (ref 39.0–52.0)
Hemoglobin: 15.3 g/dL (ref 13.0–17.0)
Potassium: 4.4 mmol/L (ref 3.5–5.1)
SODIUM: 130 mmol/L — AB (ref 135–145)
TCO2: 23 mmol/L (ref 0–100)

## 2014-12-21 LAB — CBC
HCT: 39.9 % (ref 39.0–52.0)
Hemoglobin: 14.1 g/dL (ref 13.0–17.0)
MCH: 30 pg (ref 26.0–34.0)
MCHC: 35.3 g/dL (ref 30.0–36.0)
MCV: 84.9 fL (ref 78.0–100.0)
Platelets: 265 10*3/uL (ref 150–400)
RBC: 4.7 MIL/uL (ref 4.22–5.81)
RDW: 12.1 % (ref 11.5–15.5)
WBC: 11.5 10*3/uL — AB (ref 4.0–10.5)

## 2014-12-21 LAB — GLUCOSE, CAPILLARY
GLUCOSE-CAPILLARY: 189 mg/dL — AB (ref 70–99)
Glucose-Capillary: 133 mg/dL — ABNORMAL HIGH (ref 70–99)
Glucose-Capillary: 142 mg/dL — ABNORMAL HIGH (ref 70–99)
Glucose-Capillary: 158 mg/dL — ABNORMAL HIGH (ref 70–99)
Glucose-Capillary: 162 mg/dL — ABNORMAL HIGH (ref 70–99)
Glucose-Capillary: 186 mg/dL — ABNORMAL HIGH (ref 70–99)
Glucose-Capillary: 226 mg/dL — ABNORMAL HIGH (ref 70–99)

## 2014-12-21 LAB — CBG MONITORING, ED
Glucose-Capillary: >600 mg/dL (ref 70–99)
Glucose-Capillary: 520 mg/dL — ABNORMAL HIGH (ref 70–99)

## 2014-12-21 LAB — BASIC METABOLIC PANEL
Anion gap: 6 (ref 5–15)
BUN: 13 mg/dL (ref 6–23)
CO2: 25 mmol/L (ref 19–32)
Calcium: 8.5 mg/dL (ref 8.4–10.5)
Chloride: 103 mEq/L (ref 96–112)
Creatinine, Ser: 0.55 mg/dL (ref 0.50–1.35)
GFR calc Af Amer: >90 mL/min (ref >90)
GFR calc non Af Amer: >90 mL/min (ref >90)
Glucose, Bld: 162 mg/dL — ABNORMAL HIGH (ref 70–99)
Potassium: 3.6 mmol/L (ref 3.5–5.1)
Sodium: 134 mmol/L — ABNORMAL LOW (ref 135–145)

## 2014-12-21 LAB — COMPREHENSIVE METABOLIC PANEL
ALK PHOS: 82 U/L (ref 39–117)
ALT: 27 U/L (ref 0–53)
ANION GAP: 11 (ref 5–15)
AST: 19 U/L (ref 0–37)
Albumin: 4.3 g/dL (ref 3.5–5.2)
BILIRUBIN TOTAL: 0.7 mg/dL (ref 0.3–1.2)
BUN: 15 mg/dL (ref 6–23)
CO2: 26 mmol/L (ref 19–32)
CREATININE: 1.12 mg/dL (ref 0.50–1.35)
Calcium: 8.9 mg/dL (ref 8.4–10.5)
Chloride: 92 mEq/L — ABNORMAL LOW (ref 96–112)
GFR calc non Af Amer: 87 mL/min — ABNORMAL LOW (ref >90)
GLUCOSE: 703 mg/dL — AB (ref 70–99)
Potassium: 4.4 mmol/L (ref 3.5–5.1)
SODIUM: 129 mmol/L — AB (ref 135–145)
Total Protein: 6.5 g/dL (ref 6.0–8.3)

## 2014-12-21 LAB — URINALYSIS, ROUTINE W REFLEX MICROSCOPIC
Bilirubin Urine: NEGATIVE
Glucose, UA: >1000 mg/dL — AB
HGB URINE DIPSTICK: NEGATIVE
Ketones, ur: 15 mg/dL — AB
Leukocytes, UA: NEGATIVE
Nitrite: NEGATIVE
Protein, ur: NEGATIVE mg/dL
SPECIFIC GRAVITY, URINE: 1.031 — AB (ref 1.005–1.030)
Urobilinogen, UA: 0.2 mg/dL (ref 0.0–1.0)
pH: 6 (ref 5.0–8.0)

## 2014-12-21 LAB — URINE MICROSCOPIC-ADD ON

## 2014-12-21 LAB — I-STAT CG4 LACTIC ACID, ED: LACTIC ACID, VENOUS: 1.7 mmol/L (ref 0.5–2.2)

## 2014-12-21 MED ORDER — ACETAMINOPHEN 325 MG PO TABS
650.0000 mg | ORAL_TABLET | Freq: Four times a day (QID) | ORAL | Status: DC | PRN
Start: 2014-12-21 — End: 2014-12-22
  Administered 2014-12-22: 650 mg via ORAL
  Filled 2014-12-21: qty 2

## 2014-12-21 MED ORDER — SODIUM CHLORIDE 0.9 % IV SOLN
INTRAVENOUS | Status: DC
  Administered 2014-12-21: 4.6 [IU]/h via INTRAVENOUS
  Administered 2014-12-21: 2.6 [IU]/h via INTRAVENOUS
  Filled 2014-12-21: qty 2.5

## 2014-12-21 MED ORDER — TRAMADOL HCL 50 MG PO TABS
25.0000 mg | ORAL_TABLET | Freq: Four times a day (QID) | ORAL | Status: DC | PRN
  Administered 2014-12-21: 25 mg via ORAL
  Filled 2014-12-21: qty 1

## 2014-12-21 MED ORDER — SODIUM CHLORIDE 0.9 % IV SOLN
INTRAVENOUS | Status: DC
  Filled 2014-12-21: qty 2.5

## 2014-12-21 MED ORDER — ACETAMINOPHEN 325 MG PO TABS
650.0000 mg | ORAL_TABLET | Freq: Once | ORAL | Status: AC
  Administered 2014-12-21: 650 mg via ORAL
  Filled 2014-12-21: qty 2

## 2014-12-21 MED ORDER — HEPARIN SODIUM (PORCINE) 5000 UNIT/ML IJ SOLN
5000.0000 [IU] | Freq: Three times a day (TID) | INTRAMUSCULAR | Status: DC
  Administered 2014-12-21 – 2014-12-22 (×3): 5000 [IU] via SUBCUTANEOUS
  Filled 2014-12-21 (×6): qty 1

## 2014-12-21 MED ORDER — SODIUM CHLORIDE 0.9 % IV BOLUS (SEPSIS)
1000.0000 mL | Freq: Once | INTRAVENOUS | Status: AC
  Administered 2014-12-21: 1000 mL via INTRAVENOUS

## 2014-12-21 MED ORDER — SODIUM CHLORIDE 0.9 % IV SOLN: INTRAVENOUS | Status: DC

## 2014-12-21 MED ORDER — ACETAMINOPHEN 650 MG RE SUPP: 650.0000 mg | Freq: Four times a day (QID) | RECTAL | Status: DC | PRN

## 2014-12-21 MED ORDER — INSULIN REGULAR BOLUS VIA INFUSION
0.0000 [IU] | Freq: Three times a day (TID) | INTRAVENOUS | Status: DC
  Administered 2014-12-21: 7.1 [IU] via INTRAVENOUS
  Filled 2014-12-21: qty 10

## 2014-12-21 MED ORDER — DEXTROSE 50 % IV SOLN: 25.0000 mL | INTRAVENOUS | Status: DC | PRN

## 2014-12-21 MED ORDER — ONDANSETRON HCL 4 MG PO TABS: 4.0000 mg | ORAL_TABLET | Freq: Four times a day (QID) | ORAL | Status: DC | PRN

## 2014-12-21 MED ORDER — OXYCODONE-ACETAMINOPHEN 5-325 MG PO TABS
2.0000 | ORAL_TABLET | Freq: Once | ORAL | Status: AC
  Administered 2014-12-21: 2 via ORAL
  Filled 2014-12-21: qty 2

## 2014-12-21 MED ORDER — DEXTROSE-NACL 5-0.45 % IV SOLN
INTRAVENOUS | Status: DC
  Administered 2014-12-21: 18:00:00 via INTRAVENOUS

## 2014-12-21 MED ORDER — DOXEPIN HCL 10 MG/ML PO CONC
100.0000 mg | Freq: Every day | ORAL | Status: DC
  Administered 2014-12-21: 100 mg via ORAL
  Filled 2014-12-21 (×2): qty 10

## 2014-12-21 MED ORDER — OXYCODONE-ACETAMINOPHEN 5-325 MG PO TABS
1.0000 | ORAL_TABLET | Freq: Four times a day (QID) | ORAL | Status: DC | PRN
  Administered 2014-12-22: 1 via ORAL
  Filled 2014-12-21: qty 1

## 2014-12-21 MED ORDER — AMPHETAMINE-DEXTROAMPHETAMINE 10 MG PO TABS
20.0000 mg | ORAL_TABLET | Freq: Two times a day (BID) | ORAL | Status: DC
  Administered 2014-12-21 – 2014-12-22 (×2): 20 mg via ORAL
  Filled 2014-12-21 (×2): qty 2

## 2014-12-21 MED ORDER — ALUM & MAG HYDROXIDE-SIMETH 200-200-20 MG/5ML PO SUSP: 30.0000 mL | Freq: Four times a day (QID) | ORAL | Status: DC | PRN

## 2014-12-21 MED ORDER — ONDANSETRON HCL 4 MG/2ML IJ SOLN: 4.0000 mg | Freq: Four times a day (QID) | INTRAMUSCULAR | Status: DC | PRN

## 2014-12-21 NOTE — ED Notes (Signed)
Pt requesting coffee and diet coke cola; informed patient of water only per PA. Tylenol given per request for tooth pain; Pt awaiting hospitalist

## 2014-12-21 NOTE — H&P (Signed)
Patient Demographics  [name], is a 29 y.o. male  MRN: [id number]   DOB - 30-Apr-1986  Admit Date - 12/21/2014  Outpatient Primary MD for the patient is Default, Provider, MD   With History of -  Past Medical History  Diagnosis Date  . Diabetes mellitus   . Depression   . Schizo affective schizophrenia   . Polysubstance abuse   . Scoliosis   . Chronic pain   . Noncompliance with medication regimen   . DKA (diabetic ketoacidoses) 11/18/2014  . GERD (gastroesophageal reflux disease)       Past Surgical History  Procedure Laterality Date  . I&d extremity  12/19/2011    Procedure: IRRIGATION AND DEBRIDEMENT EXTREMITY;  Surgeon: Sharma Covert;  Location: [facility] OR;  Service: Orthopedics;  Laterality: Left;    in for   Chief Complaint  Patient presents with  . Hyperglycemia     HPI  [name]  is a 29 y.o. male, with past medical history of type 1 diabetes mellitus, depression, schizophrenia, who presents with generalized weakness, reports blood sugar could not be read on his glucometer just read to high. Patient reports he did not take any insulin for the last 2 weeks due to financial reason, as he was out of money, but sugar was in the 700s range in ED, patient with normal anion gap, not in DKA, denies nausea, vomiting, fever, chills, chest pain, shortness of breath, just stays I no I am in DKA, I can just tell, patient was started on insulin drip in ED, hospitalist requested to admit.    Review of Systems    In addition to the HPI above,  No Fever-chills, complaints of generalized weakness No Headache, No changes with Vision or hearing, No problems swallowing food or Liquids, No Chest pain, Cough or Shortness of Breath, No Abdominal pain, No Nausea or Vommitting, Bowel movements are regular, No Blood in stool or Urine, No dysuria, No new skin rashes or bruises, No new joints pains-aches,  No new weakness, tingling, numbness in any extremity, No  recent weight gain or loss, Reports polyuria and polydipsia No significant Mental Stressors.  A full 10 point Review of Systems was done, except as stated above, all other Review of Systems were negative.   Social History History  Substance Use Topics  . Smoking status: Current Some Day Smoker -- 0.50 packs/day  . Smokeless tobacco: Never Used  . Alcohol Use: Yes     Comment: occ     Family History Family History  Problem Relation Age of Onset  . Diabetes Mother      Prior to Admission medications   Medication Sig Start Date End Date Taking? Authorizing Provider  amphetamine-dextroamphetamine (ADDERALL) 20 MG tablet Take 20 mg by mouth 2 (two) times daily.   Yes Historical Provider, MD  doxepin (SINEQUAN) 10 MG/ML solution Take 10 mLs by mouth at bedtime. 11/29/14  Yes Historical Provider, MD  insulin aspart (NOVOLOG) 100 UNIT/ML injection Inject 10 Units into the skin 3 (three) times daily before meals. Per sliding scale   Yes Historical Provider, MD  insulin detemir (LEVEMIR) 100 UNIT/ML injection Inject 0.35 mLs (35 Units total) into the skin daily. 11/22/14  Yes Rodolph Bong, MD    Allergies  Allergen Reactions  . Sulfa Antibiotics Other (See Comments)    Unknown - Childhood    Physical Exam  Vitals  Blood pressure 121/87, pulse 110, temperature 98.5 F (36.9 C), temperature source Oral,  resp. rate 18, height 6\' 1"  (1.854 m), weight 67.631 kg (149 lb 1.6 oz), SpO2 99 %.   1. General male lying in bed in NAD,    2. Normal affect and insight, Not Suicidal or Homicidal, Awake Alert, Oriented X 3.  3. No F.N deficits, ALL C.Nerves Intact, Strength 5/5 all 4 extremities, Sensation intact all 4 extremities, Plantars down going.  4. Ears and Eyes appear Normal, Conjunctivae clear, PERRLA. Dry Oral Mucosa.  5. Supple Neck, No JVD, No cervical lymphadenopathy appriciated, No Carotid Bruits.  6. Symmetrical Chest wall movement, Good air movement bilaterally,  CTAB.  7. RRR, No Gallops, Rubs or Murmurs, No Parasternal Heave.  8. Positive Bowel Sounds, Abdomen Soft, No tenderness, No organomegaly appriciated,No rebound -guarding or rigidity.  9.  No Cyanosis, Normal Skin Turgor, No Skin Rash or Bruise.  10. Good muscle tone,  joints appear normal , no effusions, Normal ROM.  11. No Palpable Lymph Nodes in Neck or Axillae   Data Review  CBC  Recent Labs Lab 12/21/14 1409 12/21/14 1438  WBC 11.5*  --   HGB 14.1 15.3  HCT 39.9 45.0  PLT 265  --   MCV 84.9  --   MCH 30.0  --   MCHC 35.3  --   RDW 12.1  --    ------------------------------------------------------------------------------------------------------------------  Chemistries   Recent Labs Lab 12/15/14 1834 12/21/14 1409 12/21/14 1438  NA 134* 129* 130*  K 4.0 4.4 4.4  CL 101 92* 92*  CO2 26 26  --   GLUCOSE 352* 703* 695*  BUN 14 15 19   CREATININE 0.63 1.12 1.00  CALCIUM 9.0 8.9  --   AST  --  19  --   ALT  --  27  --   ALKPHOS  --  82  --   BILITOT  --  0.7  --    ------------------------------------------------------------------------------------------------------------------ estimated creatinine clearance is 103.3 mL/min (by C-G formula based on Cr of 1). ------------------------------------------------------------------------------------------------------------------ No results for input(s): TSH, T4TOTAL, T3FREE, THYROIDAB in the last 72 hours.  Invalid input(s): FREET3   Coagulation profile No results for input(s): INR, PROTIME in the last 168 hours. ------------------------------------------------------------------------------------------------------------------- No results for input(s): DDIMER in the last 72 hours. -------------------------------------------------------------------------------------------------------------------  Cardiac Enzymes No results for input(s): CKMB, TROPONINI, MYOGLOBIN in the last 168 hours.  Invalid input(s):  CK ------------------------------------------------------------------------------------------------------------------ Invalid input(s): POCBNP   ---------------------------------------------------------------------------------------------------------------  Urinalysis    Component Value Date/Time   COLORURINE YELLOW 12/21/2014 1435   APPEARANCEUR CLEAR 12/21/2014 1435   LABSPEC 1.031* 12/21/2014 1435   PHURINE 6.0 12/21/2014 1435   GLUCOSEU >1000* 12/21/2014 1435   HGBUR NEGATIVE 12/21/2014 1435   BILIRUBINUR NEGATIVE 12/21/2014 1435   KETONESUR 15* 12/21/2014 1435   PROTEINUR NEGATIVE 12/21/2014 1435   UROBILINOGEN 0.2 12/21/2014 1435   NITRITE NEGATIVE 12/21/2014 1435   LEUKOCYTESUR NEGATIVE 12/21/2014 1435    ----------------------------------------------------------------------------------------------------------------  Imaging results:   No results found.     Assessment & Plan  Active Problems:   Hyperglycemia    Hyperglycemia, type 1 diabetes mellitus insulin-dependent with hyperglycemia - Secondary to noncompliance, even though not in DKA, but secondary to significantly elevated blood glucose, he'll be started on IV insulin and glucose stabilizer protocol, mid to telemetry, continue with IV fluids, consult diabetic coordinator, social worker and his recurrent admissions, but he received 1 L fluid bolus, will give him another 1 L, continue with normal saline at 150 mL/h.  History of anxiety and schizophrenia - Suicidal thoughts or ideation,  continue home doxepin and Adderall   DVT Prophylaxis Heparin -  AM Labs Ordered, also please review Full Orders  Family Communication: Admission, patients condition and plan of care including tests being ordered have been discussed with the patient  who indicate understanding and agree with the plan and Code Status.  Code Status  full  Likely DC to  home  Condition GUARDED    Time spent in minutes : 55  minutes    Cayleen Benjamin M.D on 12/21/2014 at 5:04 PM  Between 7am to 7pm - Pager - [phone]  After 7pm go to [email or web address] - password [email or web address]  And look for the night coverage person covering me after hours  [facility] Office  [phone]   **Disclaimer: This note may have been dictated with voice recognition software. Similar sounding words can inadvertently be transcribed and this note may contain transcription errors which may not have been corrected upon publication of note.**

## 2014-12-21 NOTE — ED Notes (Signed)
Chem 8 results given to [employer], PA-C

## 2014-12-21 NOTE — ED Notes (Signed)
Lab notified of critical lab value for glucose, 703.

## 2014-12-21 NOTE — ED Notes (Signed)
Per EMS pt from [facility] called out for fatigue, increased thirst. Pt sugar read "hi" CBG greater than 600. Pt given 500cc of NS IV. Pt endorses bilateral hip pain 6/10.

## 2014-12-21 NOTE — ED Notes (Signed)
Dr.Wofford at bedside; black coffee given per MD

## 2014-12-21 NOTE — ED Provider Notes (Signed)
CSN: [id number]     Arrival date & time 12/21/14  1357 History   First MD Initiated Contact with Patient 12/21/14 1358     Chief Complaint  Patient presents with  . Hyperglycemia     (Consider location/radiation/quality/duration/timing/severity/associated sxs/prior Treatment) HPI Comments: 29 y/o male with PMHx of IDDM, medication non-compliance, DKA, depression, polysubstance abuse and chronic pain presenting via EMS from homeless shelter with hyperglycemia. Pt states he has been out of his insulin for the past 2 weeks and does not have a PCP at this time. States he feels fatigued, thirsty and has increased urination. Admits to an episode of diarrhea. Denies fever, chills, CP, SOB, abdominal pain, n/v. Pt states "I think I'm in DKA, I can just tell". CBG via EMS reading "hi". He was given 500 cc bolus prior to arrival.  Patient is a 29 y.o. male presenting with hyperglycemia. The history is provided by the patient.  Hyperglycemia Associated symptoms: fatigue, increased thirst and polyuria     Past Medical History  Diagnosis Date  . Diabetes mellitus   . Depression   . Schizo affective schizophrenia   . Polysubstance abuse   . Scoliosis   . Chronic pain   . Noncompliance with medication regimen   . DKA (diabetic ketoacidoses) 11/18/2014  . GERD (gastroesophageal reflux disease)    Past Surgical History  Procedure Laterality Date  . I&d extremity  12/19/2011    Procedure: IRRIGATION AND DEBRIDEMENT EXTREMITY;  Surgeon: Sharma Covert;  Location: [facility] OR;  Service: Orthopedics;  Laterality: Left;   Family History  Problem Relation Age of Onset  . Diabetes Mother    History  Substance Use Topics  . Smoking status: Current Some Day Smoker -- 0.50 packs/day  . Smokeless tobacco: Never Used  . Alcohol Use: Yes     Comment: occ    Review of Systems  Constitutional: Positive for fatigue.  Gastrointestinal: Positive for diarrhea.  Endocrine: Positive for polydipsia and polyuria.   All other systems reviewed and are negative.     Allergies  Sulfa antibiotics  Home Medications   Prior to Admission medications   Medication Sig Start Date End Date Taking? Authorizing Provider  amphetamine-dextroamphetamine (ADDERALL) 20 MG tablet Take 20 mg by mouth 2 (two) times daily.   Yes Historical Provider, MD  doxepin (SINEQUAN) 10 MG/ML solution Take 10 mLs by mouth at bedtime. 11/29/14  Yes Historical Provider, MD  insulin aspart (NOVOLOG) 100 UNIT/ML injection Inject 10 Units into the skin 3 (three) times daily before meals. Per sliding scale   Yes Historical Provider, MD  insulin detemir (LEVEMIR) 100 UNIT/ML injection Inject 0.35 mLs (35 Units total) into the skin daily. 11/22/14  Yes Rodolph Bong, MD  doxepin (SINEQUAN) 100 MG capsule Take 1 capsule (100 mg total) by mouth at bedtime. Patient not taking: Reported on 12/21/2014 11/22/14   Rodolph Bong, MD  naproxen (NAPROSYN) 500 MG tablet Take 1 tablet (500 mg total) by mouth 2 (two) times daily with a meal. Patient not taking: Reported on 12/21/2014 12/15/14   Einar Gip Dansie, PA-C  QUEtiapine (SEROQUEL) 100 MG tablet Take 1 tablet (100 mg total) by mouth 2 (two) times daily. Patient not taking: Reported on 12/15/2014 11/22/14   Rodolph Bong, MD  traMADol (ULTRAM) 50 MG tablet Take 2 tablets (100 mg total) by mouth every 6 (six) hours as needed (Pain). Patient not taking: Reported on 12/15/2014 11/22/14   Rodolph Bong, MD   BP 134/93 mmHg  Pulse 105  Temp(Src) 98.3 F (36.8 C) (Oral)  Resp 20  SpO2 100% Physical Exam  Constitutional: He is oriented to person, place, and time. He appears well-developed and well-nourished. No distress.  HENT:  Head: Normocephalic and atraumatic.  Mouth/Throat: Oropharynx is clear and moist.  Eyes: Conjunctivae are normal.  Neck: Normal range of motion. Neck supple.  Cardiovascular: Regular rhythm and normal heart sounds.   Tachycardic.  Pulmonary/Chest:  Effort normal and breath sounds normal.  Abdominal: Soft. Bowel sounds are normal. There is no tenderness.  Musculoskeletal: Normal range of motion. He exhibits no edema.  Neurological: He is alert and oriented to person, place, and time.  Skin: Skin is warm and dry. He is not diaphoretic.  Psychiatric: He has a normal mood and affect. His behavior is normal.  Nursing note and vitals reviewed.   ED Course  Procedures (including critical care time) Labs Review Labs Reviewed  CBC - Abnormal; Notable for the following:    WBC 11.5 (*)    All other components within normal limits  COMPREHENSIVE METABOLIC PANEL - Abnormal; Notable for the following:    Sodium 129 (*)    Chloride 92 (*)    Glucose, Bld 703 (*)    GFR calc non Af Amer 87 (*)    All other components within normal limits  URINALYSIS, ROUTINE W REFLEX MICROSCOPIC - Abnormal; Notable for the following:    Specific Gravity, Urine 1.031 (*)    Glucose, UA >1000 (*)    Ketones, ur 15 (*)    All other components within normal limits  I-STAT CHEM 8, ED - Abnormal; Notable for the following:    Sodium 130 (*)    Chloride 92 (*)    Glucose, Bld 695 (*)    Calcium, Ion 1.11 (*)    All other components within normal limits  CBG MONITORING, ED - Abnormal; Notable for the following:    Glucose-Capillary >600 (*)    All other components within normal limits  CBG MONITORING, ED - Abnormal; Notable for the following:    Glucose-Capillary 520 (*)    All other components within normal limits  URINE MICROSCOPIC-ADD ON  I-STAT CG4 LACTIC ACID, ED    Imaging Review No results found.   EKG Interpretation None      MDM   Final diagnoses:  Hyperglycemia   Pt in NAD. Tachycardic, vitals otherwise stable. CBG on arrival >600. Glucose on CMP 703. He is not in DKA. Insulin started with glucostabilizer. Recent admission for the same, d/c on 12/6, did not have any PCP f/u. Non-compliant with medication. Will admit for hyperglycemia.  Admission accepted by Dr. Randol Kern, [facility], telemetry bed.  Discussed with attending Dr. Loretha Stapler who agrees with plan of care.   Kathrynn Speed, PA-C 12/21/14 1549  Merrie Roof, MD 12/22/14 [phone]

## 2014-12-22 LAB — CBC
HCT: 35.6 % — ABNORMAL LOW (ref 39.0–52.0)
Hemoglobin: 12.8 g/dL — ABNORMAL LOW (ref 13.0–17.0)
MCH: 30.3 pg (ref 26.0–34.0)
MCHC: 36 g/dL (ref 30.0–36.0)
MCV: 84.4 fL (ref 78.0–100.0)
PLATELETS: 248 10*3/uL (ref 150–400)
RBC: 4.22 MIL/uL (ref 4.22–5.81)
RDW: 12.5 % (ref 11.5–15.5)
WBC: 10.3 10*3/uL (ref 4.0–10.5)

## 2014-12-22 LAB — BASIC METABOLIC PANEL
ANION GAP: 7 (ref 5–15)
BUN: 10 mg/dL (ref 6–23)
CALCIUM: 8.6 mg/dL (ref 8.4–10.5)
CHLORIDE: 103 meq/L (ref 96–112)
CO2: 29 mmol/L (ref 19–32)
CREATININE: 0.45 mg/dL — AB (ref 0.50–1.35)
GFR calc non Af Amer: >90 mL/min (ref >90)
Glucose, Bld: 166 mg/dL — ABNORMAL HIGH (ref 70–99)
Potassium: 3.4 mmol/L — ABNORMAL LOW (ref 3.5–5.1)
Sodium: 139 mmol/L (ref 135–145)

## 2014-12-22 LAB — GLUCOSE, CAPILLARY
GLUCOSE-CAPILLARY: 106 mg/dL — AB (ref 70–99)
GLUCOSE-CAPILLARY: 124 mg/dL — AB (ref 70–99)
Glucose-Capillary: 319 mg/dL — ABNORMAL HIGH (ref 70–99)
Glucose-Capillary: 203 mg/dL — ABNORMAL HIGH (ref 70–99)

## 2014-12-22 MED ORDER — MENTHOL 3 MG MT LOZG
1.0000 | LOZENGE | OROMUCOSAL | Status: DC | PRN
Start: 2014-12-22 — End: 2014-12-22
  Filled 2014-12-22: qty 9

## 2014-12-22 MED ORDER — POTASSIUM CHLORIDE CRYS ER 20 MEQ PO TBCR
40.0000 meq | EXTENDED_RELEASE_TABLET | Freq: Once | ORAL | Status: AC
  Administered 2014-12-22: 40 meq via ORAL
  Filled 2014-12-22: qty 2

## 2014-12-22 MED ORDER — INSULIN DETEMIR 100 UNIT/ML ~~LOC~~ SOLN
35.0000 [IU] | Freq: Every day | SUBCUTANEOUS | Status: DC
  Administered 2014-12-22: 35 [IU] via SUBCUTANEOUS
  Filled 2014-12-22 (×2): qty 0.35

## 2014-12-22 MED ORDER — AMPHETAMINE-DEXTROAMPHETAMINE 20 MG PO TABS: 40.0000 mg | ORAL_TABLET | Freq: Two times a day (BID) | ORAL | Status: DC

## 2014-12-22 MED ORDER — TRAMADOL HCL 50 MG PO TABS: 50.0000 mg | ORAL_TABLET | Freq: Three times a day (TID) | ORAL | Status: DC | PRN

## 2014-12-22 MED ORDER — DOXEPIN HCL 10 MG/ML PO CONC: 100.0000 mg | Freq: Every day | ORAL | Status: DC

## 2014-12-22 MED ORDER — INSULIN DETEMIR 100 UNIT/ML ~~LOC~~ SOLN: 35.0000 [IU] | Freq: Every day | SUBCUTANEOUS | Status: DC

## 2014-12-22 MED ORDER — INSULIN ASPART 100 UNIT/ML ~~LOC~~ SOLN
0.0000 [IU] | Freq: Three times a day (TID) | SUBCUTANEOUS | Status: DC
  Administered 2014-12-22: 3 [IU] via SUBCUTANEOUS

## 2014-12-22 MED ORDER — INSULIN ASPART 100 UNIT/ML ~~LOC~~ SOLN: 10.0000 [IU] | Freq: Three times a day (TID) | SUBCUTANEOUS | Status: DC

## 2014-12-22 MED ORDER — "INSULIN SYRINGE 29G X 1/2"" 0.5 ML MISC": 1.0000 | Freq: Two times a day (BID) | Status: DC

## 2014-12-22 NOTE — Progress Notes (Signed)
UR completed 

## 2014-12-22 NOTE — Clinical Social Work Note (Signed)
Bus passes given to patient. Patient reports no other CSW needs. CSW signing off at this time.   Roddie Mc MSW, [place], [place], [phone]

## 2014-12-22 NOTE — Discharge Summary (Signed)
Physician Discharge Summary  [name] EGB:[id number] DOB: Jul 03, 1986 DOA: 12/21/2014  PCP: No PCP Per Patient  Admit date: 12/21/2014 Discharge date: 12/22/2014  Time spent: 50 minutes  Recommendations for Outpatient Follow-up:  1. Follow up with [facility] on 12/24/13 for refill of Insulin, repeat BMET for evaluation of hypokalemia, referral for Psychiatrist for adjustment of medications, and for management of other medical illness 2. Discharge  home   Discharge Diagnoses:  Active Problems:   Hyperglycemia   Discharge Condition: Stable  Diet recommendation: Carb modified diet  Filed Weights   12/21/14 1642  Weight: 67.631 kg (149 lb 1.6 oz)    History of present illness:  [name] is a 29 yo male with PMH of DM type 1, Schizo affective schizophrenia, depression, scoliosis, chronic pain, and GERD that presented to the ED with generalized weakness.  He reports that his blood sugar were elevated, and reports not taking his insulin for the past 2 weeks due to financial reasons.  He denies any fever, chills, chest pain, shortness of breath, nausea, vomiting, abdominal pain.  In the ED, CBGs were in the 700s,patient with normal anion gap. He was placed on insulin drip, and rehydrated with IV fluids.    Hospital Course:   Hyperglycemia, type 1 diabetes mellitus insulin-dependent with hyperglycemia -Patient presented with generalized weakness and states has not been taking his insulin for past two weeks due to financial strains.   He was placed on insulin drip per glucose stabilized protocol and was rehydrated with IV fluids.  Patient was then transitioned to home insulin regimen of 35 units of Levemir and sliding scale insulin, after CBGs stabalized.  Patient states he has been on Insulin 70/30 in the past and has done well on it. -On discharge, follow up [facility] for management of diabetes and refill of Insulin.     Schizoaffective schizophrenia -Stable with no complaints.   On discharge, continue Adderall 40 mg twice a day. -Follow-up with [facility]  for referral to psychiatrist for follow up on psychiatric issues.  Depression -Stable on discharge, denies any suicidal or homicidal ideations. -Continue Doxepin 100 mg at bedtime.  States has been prescribed Seroquel in past but has not been taking it. -Follow up with [facility] for psychiatric referral for management and adjustment of medication  Chronic pain -Stable with no complaints.  States has been prescribed tramadol but has not needed to take them. -Follow up with PCP [facility] for management of pain.   Homeless -Pt currently resides at [facility].     Procedures:  None  Consultations:  None  Discharge Exam: Filed Vitals:   12/22/14 0508  BP: 100/67  Pulse: 101  Temp: 97.8 F (36.6 C)  Resp: 16     Exam General: Alert and oriented Caucasian male in NAD. Sitting comfortably in bed Cardiovascular: Regular rate and rhythm.  No murmurs, rubs, or gallops. Respiratory: Clear to auscultate bilaterally.  No rhonchi or crepitations. Abdomen: Soft nontender bowel sounds present. No guarding or rigidity.  Psychiatric: Appears normal. No SI or HI.    Discharge Instructions     Medication List    TAKE these medications        amphetamine-dextroamphetamine 20 MG tablet  Commonly known as:  ADDERALL  Take 40 mg by mouth 2 (two) times daily.     doxepin 10 MG/ML solution  Commonly known as:  SINEQUAN  Take 10 mLs by mouth at bedtime.  insulin aspart 100 UNIT/ML injection  Commonly known as:  novoLOG  Inject 10 Units into the skin 3 (three) times daily before meals. Per sliding scale     insulin detemir 100 UNIT/ML injection  Commonly known as:  LEVEMIR  Inject 0.35 mLs (35 Units total) into the skin daily.       Allergies  Allergen Reactions  . Sulfa Antibiotics Other (See Comments)    Unknown - Childhood      The results  of significant diagnostics from this hospitalization (including imaging, microbiology, ancillary and laboratory) are listed below for reference.    Significant Diagnostic Studies: No results found.  Microbiology: No results found for this or any previous visit (from the past 240 hour(s)).   Labs: Basic Metabolic Panel:  Recent Labs Lab 12/15/14 1834 12/21/14 1409 12/21/14 1438 12/21/14 2145 12/22/14 0329  NA 134* 129* 130* 134* 139  K 4.0 4.4 4.4 3.6 3.4*  CL 101 92* 92* 103 103  CO2 26 26  --  25 29  GLUCOSE 352* 703* 695* 162* 166*  BUN 14 15 19 13 10   CREATININE 0.63 1.12 1.00 0.55 0.45*  CALCIUM 9.0 8.9  --  8.5 8.6   Liver Function Tests:  Recent Labs Lab 12/21/14 1409  AST 19  ALT 27  ALKPHOS 82  BILITOT 0.7  PROT 6.5  ALBUMIN 4.3   No results for input(s): LIPASE, AMYLASE in the last 168 hours. No results for input(s): AMMONIA in the last 168 hours. CBC:  Recent Labs Lab 12/21/14 1409 12/21/14 1438 12/22/14 0329  WBC 11.5*  --  10.3  HGB 14.1 15.3 12.8*  HCT 39.9 45.0 35.6*  MCV 84.9  --  84.4  PLT 265  --  248   Cardiac Enzymes: No results for input(s): CKTOTAL, CKMB, CKMBINDEX, TROPONINI in the last 168 hours. BNP: BNP (last 3 results) No results for input(s): PROBNP in the last 8760 hours. CBG:  Recent Labs Lab 12/21/14 2143 12/21/14 2232 12/21/14 2340 12/22/14 0128 12/22/14 0820  GLUCAP 162* 142* 133* 106* 203*       Signed:  02/20/15 PA-C  [facility] 12/22/2014, 10:27 AM   I have personally examined the patient and reviewed the entire database. Agree with the above note and plan as outlined, any necessary changes made.  Judith Campillo M.D. [facility] 12/22/2014, 1:32 PM Pager: 02/20/2015  If 7PM-7AM, please contact night-coverage [email or web address] Password [email or web address]

## 2014-12-22 NOTE — Care Management Note (Signed)
    Page 1 of 1   12/22/2014     2:44:18 PM CARE MANAGEMENT NOTE 12/22/2014  Patient:  [name], [name]   Account Number:  [id number]  Date Initiated:  12/22/2014  Documentation initiated by:  Letha Cape  Subjective/Objective Assessment:   dx hyperglycemia, honk  admit- homeless     Action/Plan:   Anticipated DC Date:  12/22/2014   Anticipated DC Plan:  HOME/SELF CARE  In-house referral  Clinical Social Worker      DC Associate Professor  CM consult  [facility]  Medication Assistance      Choice offered to / List presented to:             Status of service:  Completed, signed off Medicare Important Message given?  NA - LOS <3 / Initial given by admissions (If response is "NO", the following Medicare IM given date fields will be blank) Date Medicare IM given:   Medicare IM given by:   Date Additional Medicare IM given:   Additional Medicare IM given by:    Discharge Disposition:  HOME/SELF CARE  Per UR Regulation:  Reviewed for med. necessity/level of care/duration of stay  If discussed at Long Length of Stay Meetings, dates discussed:    Comments:  12/22/14 1440 Letha Cape RN, BSN [phone] patient states that needs ast with meds, NCM scheduled a apt at the [facility] clinic for patient, informed him they will ast him in getting his medications.  NCM spoke with Ephriam Knuckles in the pharmacy at the clinic.  Patient will have to get narcotic on his own (adderall).

## 2014-12-24 ENCOUNTER — Encounter: Payer: Self-pay | Admitting: Internal Medicine

## 2014-12-24 ENCOUNTER — Ambulatory Visit: Payer: PPO | Attending: Internal Medicine | Admitting: Internal Medicine

## 2014-12-24 VITALS — BP 130/96 | HR 98 | Temp 98.0°F | Resp 16 | Ht 73.0 in | Wt 151.0 lb

## 2014-12-24 DIAGNOSIS — F902 Attention-deficit hyperactivity disorder, combined type: Secondary | ICD-10-CM

## 2014-12-24 DIAGNOSIS — Z720 Tobacco use: Secondary | ICD-10-CM

## 2014-12-24 DIAGNOSIS — E109 Type 1 diabetes mellitus without complications: Secondary | ICD-10-CM

## 2014-12-24 DIAGNOSIS — F909 Attention-deficit hyperactivity disorder, unspecified type: Secondary | ICD-10-CM | POA: Insufficient documentation

## 2014-12-24 DIAGNOSIS — M25551 Pain in right hip: Secondary | ICD-10-CM | POA: Diagnosis not present

## 2014-12-24 DIAGNOSIS — F1910 Other psychoactive substance abuse, uncomplicated: Secondary | ICD-10-CM

## 2014-12-24 LAB — GLUCOSE, POCT (MANUAL RESULT ENTRY): POC GLUCOSE: 66 mg/dL — AB (ref 70–99)

## 2014-12-24 MED ORDER — VARENICLINE TARTRATE 0.5 MG X 11 & 1 MG X 42 PO MISC: ORAL | Status: DC

## 2014-12-24 MED ORDER — AMPHETAMINE-DEXTROAMPHETAMINE 20 MG PO TABS: 40.0000 mg | ORAL_TABLET | Freq: Two times a day (BID) | ORAL | Status: DC

## 2014-12-24 MED ORDER — FREESTYLE LANCETS MISC: Status: DC

## 2014-12-24 MED ORDER — "INSULIN SYRINGE-NEEDLE U-100 30G X 5/16"" 0.5 ML MISC": Status: DC

## 2014-12-24 MED ORDER — GLUCOSE BLOOD VI STRP: ORAL_STRIP | Status: DC

## 2014-12-24 MED ORDER — ACETAMINOPHEN-CODEINE #3 300-30 MG PO TABS: 1.0000 | ORAL_TABLET | ORAL | Status: DC | PRN

## 2014-12-24 NOTE — Patient Instructions (Signed)
Diabetes and Exercise Exercising regularly is important. It is not just about losing weight. It has many health benefits, such as:  Improving your overall fitness, flexibility, and endurance.  Increasing your bone density.  Helping with weight control.  Decreasing your body fat.  Increasing your muscle strength.  Reducing stress and tension.  Improving your overall health. People with diabetes who exercise gain additional benefits because exercise:  Reduces appetite.  Improves the body's use of blood sugar (glucose).  Helps lower or control blood glucose.  Decreases blood pressure.  Helps control blood lipids (such as cholesterol and triglycerides).  Improves the body's use of the hormone insulin by:  Increasing the body's insulin sensitivity.  Reducing the body's insulin needs.  Decreases the risk for heart disease because exercising:  Lowers cholesterol and triglycerides levels.  Increases the levels of good cholesterol (such as high-density lipoproteins [HDL]) in the body.  Lowers blood glucose levels. YOUR ACTIVITY PLAN  Choose an activity that you enjoy and set realistic goals. Your health care provider or diabetes educator can help you make an activity plan that works for you. Exercise regularly as directed by your health care provider. This includes:  Performing resistance training twice a week such as push-ups, sit-ups, lifting weights, or using resistance bands.  Performing 150 minutes of cardio exercises each week such as walking, running, or playing sports.  Staying active and spending no more than 90 minutes at one time being inactive. Even short bursts of exercise are good for you. Three 10-minute sessions spread throughout the day are just as beneficial as a single 30-minute session. Some exercise ideas include:  Taking the dog for a walk.  Taking the stairs instead of the elevator.  Dancing to your favorite song.  Doing an exercise  video.  Doing your favorite exercise with a friend. RECOMMENDATIONS FOR EXERCISING WITH TYPE 1 OR TYPE 2 DIABETES   Check your blood glucose before exercising. If blood glucose levels are greater than 240 mg/dL, check for urine ketones. Do not exercise if ketones are present.  Avoid injecting insulin into areas of the body that are going to be exercised. For example, avoid injecting insulin into:  The arms when playing tennis.  The legs when jogging.  Keep a record of:  Food intake before and after you exercise.  Expected peak times of insulin action.  Blood glucose levels before and after you exercise.  The type and amount of exercise you have done.  Review your records with your health care provider. Your health care provider will help you to develop guidelines for adjusting food intake and insulin amounts before and after exercising.  If you take insulin or oral hypoglycemic agents, watch for signs and symptoms of hypoglycemia. They include:  Dizziness.  Shaking.  Sweating.  Chills.  Confusion.  Drink plenty of water while you exercise to prevent dehydration or heat stroke. Body water is lost during exercise and must be replaced.  Talk to your health care provider before starting an exercise program to make sure it is safe for you. Remember, almost any type of activity is better than none. Document Released: 02/24/2004 Document Revised: 04/20/2014 Document Reviewed: 05/13/2013 ExitCare Patient Information 2015 [employer]. This information is not intended to replace advice given to you by your health care provider. Make sure you discuss any questions you have with your health care provider. Basic Carbohydrate Counting for Diabetes Mellitus Carbohydrate counting is a method for keeping track of the amount of carbohydrates you eat.   Eating carbohydrates naturally increases the level of sugar (glucose) in your blood, so it is important for you to know the amount that is  okay for you to have in every meal. Carbohydrate counting helps keep the level of glucose in your blood within normal limits. The amount of carbohydrates allowed is different for every person. A dietitian can help you calculate the amount that is right for you. Once you know the amount of carbohydrates you can have, you can count the carbohydrates in the foods you want to eat. Carbohydrates are found in the following foods:  Grains, such as breads and cereals.  Dried beans and soy products.  Starchy vegetables, such as potatoes, peas, and corn.  Fruit and fruit juices.  Milk and yogurt.  Sweets and snack foods, such as cake, cookies, candy, chips, soft drinks, and fruit drinks. CARBOHYDRATE COUNTING There are two ways to count the carbohydrates in your food. You can use either of the methods or a combination of both. Reading the "Nutrition Facts" on Packaged Food The "Nutrition Facts" is an area that is included on the labels of almost all packaged food and beverages in the [place]. It includes the serving size of that food or beverage and information about the nutrients in each serving of the food, including the grams (g) of carbohydrate per serving.  Decide the number of servings of this food or beverage that you will be able to eat or drink. Multiply that number of servings by the number of grams of carbohydrate that is listed on the label for that serving. The total will be the amount of carbohydrates you will be having when you eat or drink this food or beverage. Learning Standard Serving Sizes of Food When you eat food that is not packaged or does not include "Nutrition Facts" on the label, you need to measure the servings in order to count the amount of carbohydrates.A serving of most carbohydrate-rich foods contains about 15 g of carbohydrates. The following list includes serving sizes of carbohydrate-rich foods that provide 15 g ofcarbohydrate per serving:   1 slice of bread  (1 oz) or 1 six-inch tortilla.    of a hamburger bun or English muffin.  4-6 crackers.   cup unsweetened dry cereal.    cup hot cereal.   cup rice or pasta.    cup mashed potatoes or  of a large baked potato.  1 cup fresh fruit or one small piece of fruit.    cup canned or frozen fruit or fruit juice.  1 cup milk.   cup plain fat-free yogurt or yogurt sweetened with artificial sweeteners.   cup cooked dried beans or starchy vegetable, such as peas, corn, or potatoes.  Decide the number of standard-size servings that you will eat. Multiply that number of servings by 15 (the grams of carbohydrates in that serving). For example, if you eat 2 cups of strawberries, you will have eaten 2 servings and 30 g of carbohydrates (2 servings x 15 g = 30 g). For foods such as soups and casseroles, in which more than one food is mixed in, you will need to count the carbohydrates in each food that is included. EXAMPLE OF CARBOHYDRATE COUNTING Sample Dinner  3 oz chicken breast.   cup of brown rice.   cup of corn.  1 cup milk.   1 cup strawberries with sugar-free whipped topping.  Carbohydrate Calculation Step 1: Identify the foods that contain carbohydrates:   Rice.   Corn.     Milk.   Strawberries. Step 2:Calculate the number of servings eaten of each:   2 servings of rice.   1 serving of corn.   1 serving of milk.   1 serving of strawberries. Step 3: Multiply each of those number of servings by 15 g:   2 servings of rice x 15 g = 30 g.   1 serving of corn x 15 g = 15 g.   1 serving of milk x 15 g = 15 g.   1 serving of strawberries x 15 g = 15 g. Step 4: Add together all of the amounts to find the total grams of carbohydrates eaten: 30 g + 15 g + 15 g + 15 g = 75 g. Document Released: 12/04/2005 Document Revised: 04/20/2014 Document Reviewed: 10/31/2013 ExitCare Patient Information 2015 [employer]. This information is not intended to  replace advice given to you by your health care provider. Make sure you discuss any questions you have with your health care provider.  

## 2014-12-24 NOTE — Progress Notes (Signed)
[facility] Pt is here to manage his diabetes. Pt reports having arthritis in b/l hips for 6 months.

## 2014-12-24 NOTE — Progress Notes (Signed)
Patient ID: [name], male   DOB: 1985/12/20, 29 y.o.   MRN: [id number]   [name], is a 29 y.o. male  [id number]  [id number]  DOB - 10/24/1986  CC:  Chief Complaint  Patient presents with  . Establish Care  . Hospitalization Follow-up       HPI: [name] is a 29 y.o. male here today to establish medical care. Patient is homeless with history of type 1 diabetes mellitus, schizoaffective disorder, major depression, polysubstance abuse, ADD, chronic pain syndrome and hip arthritis came into clinic today to establish care and for hospital follow-up. Patient was recently discharged from the hospital after admission for DKA. Patient is requesting refill of his ADD medication and "something stronger" for pain. Patient is known for noncompliance with his diabetes medications, has had recurrent DKA for which she has been admitted and frequent ED visits for the same. Patient would like to stop smoking, requesting Chantix prescription. He does not have any major complaint today. Patient has No headache, No chest pain, No abdominal pain - No Nausea, No new weakness tingling or numbness, No Cough - SOB.  Allergies  Allergen Reactions  . Sulfa Antibiotics Other (See Comments)    Unknown - Childhood   Past Medical History  Diagnosis Date  . Diabetes mellitus   . Depression   . Schizo affective schizophrenia   . Polysubstance abuse   . Scoliosis   . Chronic pain   . Noncompliance with medication regimen   . DKA (diabetic ketoacidoses) 11/18/2014  . GERD (gastroesophageal reflux disease)   . Arthritis     "both hips" (12/21/2014)   Current Outpatient Prescriptions on File Prior to Visit  Medication Sig Dispense Refill  . insulin aspart (NOVOLOG) 100 UNIT/ML injection Inject 10 Units into the skin 3 (three) times daily before meals. Per sliding scale 10 mL 11  . insulin detemir (LEVEMIR) 100 UNIT/ML injection Inject 0.35 mLs (35 Units total) into the skin daily. 10  mL 11  . INSULIN SYRINGE .5CC/29G 29G X 1/2" 0.5 ML MISC 1 vial by Does not apply route 2 (two) times daily. 50 each 0  . doxepin (SINEQUAN) 10 MG/ML solution Take 10 mLs (100 mg total) by mouth at bedtime. (Patient not taking: Reported on 12/24/2014) 30 mL 0  . [DISCONTINUED] citalopram (CELEXA) 20 MG tablet Take 20 mg by mouth daily.    . [DISCONTINUED] FLUoxetine (PROZAC) 20 MG tablet Take 20 mg by mouth every morning.    . [DISCONTINUED] gabapentin (NEURONTIN) 600 MG tablet Take 600 mg by mouth 3 (three) times daily.    . [DISCONTINUED] insulin glargine (LANTUS) 100 UNIT/ML injection Inject 30 Units into the skin at bedtime. 10 mL 3  . [DISCONTINUED] insulin lispro (HUMALOG) 100 UNIT/ML injection Inject 10 Units into the skin 3 (three) times daily before meals.      . [DISCONTINUED] OLANZapine zydis (ZYPREXA) 10 MG disintegrating tablet Take 10 mg by mouth 2 (two) times daily.    . [DISCONTINUED] sertraline (ZOLOFT) 50 MG tablet Take 50 mg by mouth daily. For depression. Just started med, have not picked up yet [facility] [address]     No current facility-administered medications on file prior to visit.   Family History  Problem Relation Age of Onset  . Diabetes Mother    History   Social History  . Marital Status: Married    Spouse Name: N/A    Number of Children: N/A  . Years of Education: N/A   Occupational  History  . Not on file.   Social History Main Topics  . Smoking status: Current Some Day Smoker -- 1.00 packs/day for 7 years    Types: Cigarettes  . Smokeless tobacco: Never Used  . Alcohol Use: Yes     Comment: 12/21/2014 "I drink on a rare occasion"  . Drug Use: Yes    Special: Marijuana     Comment: 12/21/2014 "I use marijuana on a rare occasion"  . Sexual Activity: Yes   Other Topics Concern  . Not on file   Social History Narrative   ** Merged History Encounter **        Review of Systems: Constitutional: Negative for fever, chills, diaphoresis, activity  change, appetite change and fatigue. HENT: Negative for ear pain, nosebleeds, congestion, facial swelling, rhinorrhea, neck pain, neck stiffness and ear discharge.  Eyes: Negative for pain, discharge, redness, itching and visual disturbance. Respiratory: Negative for cough, choking, chest tightness, shortness of breath, wheezing and stridor.  Cardiovascular: Negative for chest pain, palpitations and leg swelling. Gastrointestinal: Negative for abdominal distention. Genitourinary: Negative for dysuria, urgency, frequency, hematuria, flank pain, decreased urine volume, difficulty urinating and dyspareunia.  Musculoskeletal: Negative for back pain, joint swelling, arthralgia and gait problem. Neurological: Negative for dizziness, tremors, seizures, syncope, facial asymmetry, speech difficulty, weakness, light-headedness, numbness and headaches.  Hematological: Negative for adenopathy. Does not bruise/bleed easily. Psychiatric/Behavioral: Negative for hallucinations, behavioral problems, confusion, dysphoric mood, decreased concentration and agitation.    Objective:   Filed Vitals:   12/24/14 0944  BP: 130/96  Pulse: 98  Temp: 98 F (36.7 C)  Resp: 16    Physical Exam: Constitutional: Patient appears well-developed and well-nourished. No distress. HENT: Normocephalic, atraumatic, External right and left ear normal. Oropharynx is clear and moist.  Eyes: Conjunctivae and EOM are normal. PERRLA, no scleral icterus. Neck: Normal ROM. Neck supple. No JVD. No tracheal deviation. No thyromegaly. CVS: RRR, S1/S2 +, no murmurs, no gallops, no carotid bruit.  Pulmonary: Effort and breath sounds normal, no stridor, rhonchi, wheezes, rales.  Abdominal: Soft. BS +, no distension, tenderness, rebound or guarding.  Musculoskeletal: Normal range of motion. No edema and no tenderness.  Lymphadenopathy: No lymphadenopathy noted, cervical, inguinal or axillary Neuro: Alert. Normal reflexes, muscle tone  coordination. No cranial nerve deficit. Skin: Skin is warm and dry. No rash noted. Not diaphoretic. No erythema. No pallor. Psychiatric: Normal mood and affect. Behavior, judgment, thought content normal.  Lab Results  Component Value Date   WBC 10.3 12/22/2014   HGB 12.8* 12/22/2014   HCT 35.6* 12/22/2014   MCV 84.4 12/22/2014   PLT 248 12/22/2014   Lab Results  Component Value Date   CREATININE 0.45* 12/22/2014   BUN 10 12/22/2014   NA 139 12/22/2014   K 3.4* 12/22/2014   CL 103 12/22/2014   CO2 29 12/22/2014    Lab Results  Component Value Date   HGBA1C 1 12/22/2014   Lipid Panel     Component Value Date/Time   CHOL 143 08/20/2011 0607   TRIG 110 08/20/2011 0607   HDL 42 08/20/2011 0607   CHOLHDL 3.4 08/20/2011 0607   VLDL 22 08/20/2011 0607   LDLCALC 79 08/20/2011 0607       Assessment and plan:   1. Type 1 diabetes mellitus without complication  - Glucose (CBG) - Continue current regimen Aim for 2-3 Carb Choices per meal (30-45 grams) +/- 1 either way  Aim for 0-15 Carbs per snack if hungry  Include protein in  moderation with your meals and snacks  Consider reading food labels for Total Carbohydrate and Fat Grams of foods  Consider checking BG at alternate times per day  Continue taking medication as directed Fruit Punch - find one with no sugar  Measure and decrease portions of carbohydrate foods  Make your plate and don't go back for seconds   2. Attention deficit hyperactivity disorder (ADHD), combined type  - amphetamine-dextroamphetamine (ADDERALL) 20 MG tablet; Take 2 tablets (40 mg total) by mouth 2 (two) times daily.  Dispense: 120 tablet; Refill: 0   - I have told patient about ongoing prescription of Adderall, will refer patient to psychiatrist who will determine the need of this medication on a chronic basis and prescribed same subsequently.  3. Hip pain, right  - acetaminophen-codeine (TYLENOL #3) 300-30 MG per tablet; Take 1 tablet by  mouth every 4 (four) hours as needed.  Dispense: 90 tablet; Refill: 0  4. Nicotine abuse  - varenicline (CHANTIX STARTING MONTH PAK) 0.5 MG X 11 & 1 MG X 42 tablet; Take one 0.5 mg tablet by mouth once daily for 3 days, then increase to one 0.5 mg tablet twice daily for 4 days, then increase to one 1 mg tablet twice daily.  Dispense: 53 tablet; Refill: 0   Return in about 2 months (around 02/22/2015), or if symptoms worsen or fail to improve, for Hemoglobin A1C and Follow up, DM, Routine Follow Up.  The patient was given clear instructions to go to ER or return to medical center if symptoms don't improve, worsen or new problems develop. The patient verbalized understanding. The patient was told to call to get lab results if they haven't heard anything in the next week.     This note has been created with Education officer, environmental. Any transcriptional errors are unintentional.    Jeanann Lewandowsky, MD, MHA, FACP, FAAP [facility] [place], [place] [phone]   12/24/2014, 10:43 AM

## 2014-12-30 ENCOUNTER — Encounter (HOSPITAL_COMMUNITY): Payer: Self-pay

## 2014-12-30 ENCOUNTER — Emergency Department (INDEPENDENT_AMBULATORY_CARE_PROVIDER_SITE_OTHER)
Admission: EM | Admit: 2014-12-30 | Discharge: 2014-12-30 | Disposition: A | Discharge status: Home / Self Care | Payer: PPO | Source: Home / Self Care | Attending: Emergency Medicine | Admitting: Emergency Medicine

## 2014-12-30 DIAGNOSIS — J019 Acute sinusitis, unspecified: Secondary | ICD-10-CM

## 2014-12-30 MED ORDER — AMOXICILLIN 500 MG PO CAPS: 1000.0000 mg | ORAL_CAPSULE | Freq: Three times a day (TID) | ORAL | Status: DC

## 2014-12-30 MED ORDER — BENZONATATE 200 MG PO CAPS: 200.0000 mg | ORAL_CAPSULE | Freq: Three times a day (TID) | ORAL | Status: DC | PRN

## 2014-12-30 MED ORDER — FLUTICASONE PROPIONATE 50 MCG/ACT NA SUSP: 2.0000 | Freq: Every day | NASAL | Status: DC

## 2014-12-30 NOTE — ED Notes (Signed)
C/o poss sinus infection .

## 2014-12-30 NOTE — Discharge Instructions (Signed)

## 2014-12-30 NOTE — ED Provider Notes (Signed)
   Chief Complaint   Facial Pain   History of Present Illness   [name] is a 29 year old homeless gentleman with diabetes and schizoaffective disorder. He presents with a four-day history of sore throat, nasal congestion with bloody, yellow drainage, sinus pain and pressure, chills, cough productive yellow sputum, pinching sensation in his left pectoral area, and feeling weak, tired, and rundown. He denies any fever, chills, or GI symptoms.  Review of Systems   Other than as noted above, the patient denies any of the following symptoms: Systemic:  No fevers, chills, sweats, or myalgias. Eye:  No redness or discharge. ENT:  No ear pain, headache, nasal congestion, drainage, sinus pressure, or sore throat. Neck:  No neck pain, stiffness, or swollen glands. Lungs:  No cough, sputum production, hemoptysis, wheezing, chest tightness, shortness of breath or chest pain. GI:  No abdominal pain, nausea, vomiting or diarrhea.  PMFSH   Past medical history, family history, social history, meds, and allergies were reviewed. He has a history of type 1 diabetes, schizoaffective disorder, and polysubstance abuse. He's on her medications including Tylenol No. 3, Adderall, doxepin, NovoLog, and Levemir. He's allergic to sulfa.  Physical exam   Vital signs:  BP 130/90 mmHg  Pulse 108  Temp(Src) 96.7 F (35.9 C) (Oral)  Resp 18  SpO2 97% General:  Alert and oriented.  In no distress.  Skin warm and dry. Eye:  No conjunctival injection or drainage. Lids were normal. ENT:  TMs and canals were normal, without erythema or inflammation.  Nasal mucosa was congested with clear to yellow drainage.  Mucous membranes were moist.  Pharynx was clear with no exudate or drainage.  There were no oral ulcerations or lesions. Neck:  Supple, no adenopathy, tenderness or mass. Lungs:  No respiratory distress.  Lungs were clear to auscultation, without wheezes, rales or rhonchi.  Breath sounds were clear and  equal bilaterally.  Heart:  Regular rhythm, without gallops, murmers or rubs. Skin:  Clear, warm, and dry, without rash or lesions.  Assessment     The encounter diagnosis was Acute sinusitis, recurrence not specified, unspecified location.  Plan    1.  Meds:  The following meds were prescribed:   New Prescriptions   AMOXICILLIN (AMOXIL) 500 MG CAPSULE    Take 2 capsules (1,000 mg total) by mouth 3 (three) times daily.   BENZONATATE (TESSALON) 200 MG CAPSULE    Take 1 capsule (200 mg total) by mouth 3 (three) times daily as needed for cough.   FLUTICASONE (FLONASE) 50 MCG/ACT NASAL SPRAY    Place 2 sprays into both nostrils daily.    2.  Patient Education/Counseling:  The patient was given appropriate handouts, self care instructions, and instructed in symptomatic relief.  Instructed to get extra fluids and extra rest.    3.  Follow up:  The patient was told to follow up here if no better in 3 to 4 days, or sooner if becoming worse in any way, and given some red flag symptoms such as increasing fever, difficulty breathing, chest pain, or persistent vomiting which would prompt immediate return.       Reuben Likes, MD 12/30/14 [phone]

## 2014-12-31 ENCOUNTER — Encounter (HOSPITAL_COMMUNITY): Payer: Self-pay | Admitting: Orthopedic Surgery

## 2015-01-12 ENCOUNTER — Other Ambulatory Visit: Payer: Self-pay | Admitting: Emergency Medicine

## 2015-01-12 MED ORDER — FREESTYLE SYSTEM KIT: 1.0000 | PACK | Status: DC | PRN

## 2015-01-23 ENCOUNTER — Emergency Department (HOSPITAL_COMMUNITY): Payer: PPO

## 2015-01-23 ENCOUNTER — Encounter (HOSPITAL_COMMUNITY): Payer: Self-pay | Admitting: *Deleted

## 2015-01-23 ENCOUNTER — Inpatient Hospital Stay (HOSPITAL_COMMUNITY)
Admission: EM | Admit: 2015-01-23 | Discharge: 2015-01-24 | DRG: 194 | Discharge status: Against Medical Advice | Payer: PPO | Attending: Internal Medicine | Admitting: Internal Medicine

## 2015-01-23 DIAGNOSIS — F329 Major depressive disorder, single episode, unspecified: Secondary | ICD-10-CM | POA: Diagnosis present

## 2015-01-23 DIAGNOSIS — R05 Cough: Secondary | ICD-10-CM

## 2015-01-23 DIAGNOSIS — R739 Hyperglycemia, unspecified: Secondary | ICD-10-CM

## 2015-01-23 DIAGNOSIS — E871 Hypo-osmolality and hyponatremia: Secondary | ICD-10-CM | POA: Diagnosis present

## 2015-01-23 DIAGNOSIS — Z9114 Patient's other noncompliance with medication regimen: Secondary | ICD-10-CM | POA: Diagnosis present

## 2015-01-23 DIAGNOSIS — F209 Schizophrenia, unspecified: Secondary | ICD-10-CM

## 2015-01-23 DIAGNOSIS — F17210 Nicotine dependence, cigarettes, uncomplicated: Secondary | ICD-10-CM | POA: Diagnosis present

## 2015-01-23 DIAGNOSIS — R059 Cough, unspecified: Secondary | ICD-10-CM

## 2015-01-23 DIAGNOSIS — M419 Scoliosis, unspecified: Secondary | ICD-10-CM | POA: Diagnosis present

## 2015-01-23 DIAGNOSIS — Z590 Homelessness: Secondary | ICD-10-CM

## 2015-01-23 DIAGNOSIS — F259 Schizoaffective disorder, unspecified: Secondary | ICD-10-CM | POA: Diagnosis present

## 2015-01-23 DIAGNOSIS — Z79899 Other long term (current) drug therapy: Secondary | ICD-10-CM | POA: Diagnosis not present

## 2015-01-23 DIAGNOSIS — Z794 Long term (current) use of insulin: Secondary | ICD-10-CM | POA: Diagnosis not present

## 2015-01-23 DIAGNOSIS — J189 Pneumonia, unspecified organism: Principal | ICD-10-CM | POA: Diagnosis present

## 2015-01-23 DIAGNOSIS — M13851 Other specified arthritis, right hip: Secondary | ICD-10-CM | POA: Diagnosis present

## 2015-01-23 DIAGNOSIS — Z882 Allergy status to sulfonamides status: Secondary | ICD-10-CM

## 2015-01-23 DIAGNOSIS — G8929 Other chronic pain: Secondary | ICD-10-CM | POA: Diagnosis present

## 2015-01-23 DIAGNOSIS — E860 Dehydration: Secondary | ICD-10-CM | POA: Diagnosis present

## 2015-01-23 DIAGNOSIS — F203 Undifferentiated schizophrenia: Secondary | ICD-10-CM

## 2015-01-23 DIAGNOSIS — K219 Gastro-esophageal reflux disease without esophagitis: Secondary | ICD-10-CM | POA: Diagnosis present

## 2015-01-23 DIAGNOSIS — E1065 Type 1 diabetes mellitus with hyperglycemia: Secondary | ICD-10-CM | POA: Diagnosis present

## 2015-01-23 DIAGNOSIS — Y95 Nosocomial condition: Secondary | ICD-10-CM | POA: Diagnosis present

## 2015-01-23 DIAGNOSIS — Z833 Family history of diabetes mellitus: Secondary | ICD-10-CM

## 2015-01-23 DIAGNOSIS — M13852 Other specified arthritis, left hip: Secondary | ICD-10-CM | POA: Diagnosis present

## 2015-01-23 DIAGNOSIS — IMO0002 Reserved for concepts with insufficient information to code with codable children: Secondary | ICD-10-CM | POA: Diagnosis present

## 2015-01-23 LAB — EXPECTORATED SPUTUM ASSESSMENT W GRAM STAIN, RFLX TO RESP C

## 2015-01-23 LAB — CBG MONITORING, ED
GLUCOSE-CAPILLARY: 251 mg/dL — AB (ref 70–99)
Glucose-Capillary: 402 mg/dL — ABNORMAL HIGH (ref 70–99)

## 2015-01-23 LAB — BASIC METABOLIC PANEL
Anion gap: 13 (ref 5–15)
BUN: 9 mg/dL (ref 6–23)
CHLORIDE: 97 mmol/L (ref 96–112)
CO2: 20 mmol/L (ref 19–32)
Calcium: 8.4 mg/dL (ref 8.4–10.5)
Creatinine, Ser: 0.97 mg/dL (ref 0.50–1.35)
GFR calc Af Amer: >90 mL/min (ref >90)
Glucose, Bld: 651 mg/dL (ref 70–99)
Potassium: 4.1 mmol/L (ref 3.5–5.1)
SODIUM: 130 mmol/L — AB (ref 135–145)

## 2015-01-23 LAB — CBC
HCT: 36.5 % — ABNORMAL LOW (ref 39.0–52.0)
HEMOGLOBIN: 12.3 g/dL — AB (ref 13.0–17.0)
MCH: 29.2 pg (ref 26.0–34.0)
MCHC: 33.7 g/dL (ref 30.0–36.0)
MCV: 86.7 fL (ref 78.0–100.0)
Platelets: 181 10*3/uL (ref 150–400)
RBC: 4.21 MIL/uL — ABNORMAL LOW (ref 4.22–5.81)
RDW: 13 % (ref 11.5–15.5)
WBC: 12.9 10*3/uL — AB (ref 4.0–10.5)

## 2015-01-23 LAB — GLUCOSE, CAPILLARY
GLUCOSE-CAPILLARY: 160 mg/dL — AB (ref 70–99)
Glucose-Capillary: 213 mg/dL — ABNORMAL HIGH (ref 70–99)
Glucose-Capillary: 218 mg/dL — ABNORMAL HIGH (ref 70–99)
Glucose-Capillary: 195 mg/dL — ABNORMAL HIGH (ref 70–99)
Glucose-Capillary: 133 mg/dL — ABNORMAL HIGH (ref 70–99)

## 2015-01-23 LAB — MRSA PCR SCREENING: MRSA BY PCR: NEGATIVE

## 2015-01-23 LAB — STREP PNEUMONIAE URINARY ANTIGEN: STREP PNEUMO URINARY ANTIGEN: NEGATIVE

## 2015-01-23 MED ORDER — VANCOMYCIN HCL IN DEXTROSE 1-5 GM/200ML-% IV SOLN
1000.0000 mg | Freq: Once | INTRAVENOUS | Status: AC
Start: 2015-01-23 — End: 2015-01-23
  Administered 2015-01-23: 1000 mg via INTRAVENOUS
  Filled 2015-01-23 (×2): qty 200

## 2015-01-23 MED ORDER — DEXTROSE 5 % IV SOLN: 1.0000 g | Freq: Once | INTRAVENOUS | Status: DC

## 2015-01-23 MED ORDER — HYDROCODONE-ACETAMINOPHEN 5-325 MG PO TABS
1.0000 | ORAL_TABLET | ORAL | Status: DC | PRN
  Administered 2015-01-24: 2 via ORAL
  Filled 2015-01-23: qty 2

## 2015-01-23 MED ORDER — LORAZEPAM 0.5 MG PO TABS
0.5000 mg | ORAL_TABLET | Freq: Once | ORAL | Status: AC
  Administered 2015-01-23: 0.5 mg via ORAL
  Filled 2015-01-23: qty 1

## 2015-01-23 MED ORDER — INSULIN REGULAR BOLUS VIA INFUSION
0.0000 [IU] | Freq: Three times a day (TID) | INTRAVENOUS | Status: DC
  Administered 2015-01-23: 4.5 [IU] via INTRAVENOUS
  Administered 2015-01-23 – 2015-01-24 (×2): 5 [IU] via INTRAVENOUS
  Administered 2015-01-24: 6 [IU] via INTRAVENOUS
  Administered 2015-01-24: 4 [IU] via INTRAVENOUS
  Filled 2015-01-23: qty 10

## 2015-01-23 MED ORDER — SODIUM CHLORIDE 0.9 % IV SOLN
INTRAVENOUS | Status: DC
  Administered 2015-01-23: 1.9 [IU]/h via INTRAVENOUS
  Administered 2015-01-23: 3.4 [IU]/h via INTRAVENOUS
  Filled 2015-01-23: qty 2.5

## 2015-01-23 MED ORDER — SODIUM CHLORIDE 0.9 % IV BOLUS (SEPSIS)
1000.0000 mL | Freq: Once | INTRAVENOUS | Status: AC
Start: 2015-01-23 — End: 2015-01-23
  Administered 2015-01-23: 1000 mL via INTRAVENOUS

## 2015-01-23 MED ORDER — ONDANSETRON HCL 4 MG/2ML IJ SOLN: 4.0000 mg | Freq: Four times a day (QID) | INTRAMUSCULAR | Status: DC | PRN

## 2015-01-23 MED ORDER — ACETAMINOPHEN 650 MG RE SUPP: 650.0000 mg | Freq: Four times a day (QID) | RECTAL | Status: DC | PRN

## 2015-01-23 MED ORDER — DEXTROSE-NACL 5-0.45 % IV SOLN
INTRAVENOUS | Status: DC
  Administered 2015-01-23: 23:00:00 via INTRAVENOUS

## 2015-01-23 MED ORDER — PIPERACILLIN-TAZOBACTAM 3.375 G IVPB 30 MIN
3.3750 g | Freq: Once | INTRAVENOUS | Status: AC
  Administered 2015-01-23: 3.375 g via INTRAVENOUS
  Filled 2015-01-23: qty 50

## 2015-01-23 MED ORDER — ENOXAPARIN SODIUM 40 MG/0.4ML ~~LOC~~ SOLN: 40.0000 mg | SUBCUTANEOUS | Status: DC

## 2015-01-23 MED ORDER — SODIUM CHLORIDE 0.9 % IV BOLUS (SEPSIS)
1000.0000 mL | Freq: Once | INTRAVENOUS | Status: AC
  Administered 2015-01-23: 1000 mL via INTRAVENOUS

## 2015-01-23 MED ORDER — GUAIFENESIN-DM 100-10 MG/5ML PO SYRP: 5.0000 mL | ORAL_SOLUTION | ORAL | Status: DC | PRN

## 2015-01-23 MED ORDER — ALUM & MAG HYDROXIDE-SIMETH 200-200-20 MG/5ML PO SUSP: 30.0000 mL | Freq: Four times a day (QID) | ORAL | Status: DC | PRN

## 2015-01-23 MED ORDER — SODIUM CHLORIDE 0.9 % IV SOLN
INTRAVENOUS | Status: DC
  Administered 2015-01-23: 16:00:00 via INTRAVENOUS

## 2015-01-23 MED ORDER — POLYETHYLENE GLYCOL 3350 17 G PO PACK: 17.0000 g | PACK | Freq: Every day | ORAL | Status: DC | PRN

## 2015-01-23 MED ORDER — INSULIN DETEMIR 100 UNIT/ML ~~LOC~~ SOLN: 20.0000 [IU] | Freq: Two times a day (BID) | SUBCUTANEOUS | Status: DC

## 2015-01-23 MED ORDER — DEXTROSE 5 % IV SOLN: 500.0000 mg | Freq: Once | INTRAVENOUS | Status: DC

## 2015-01-23 MED ORDER — AMPHETAMINE-DEXTROAMPHETAMINE 10 MG PO TABS
40.0000 mg | ORAL_TABLET | Freq: Two times a day (BID) | ORAL | Status: DC
  Administered 2015-01-23 – 2015-01-24 (×2): 40 mg via ORAL
  Filled 2015-01-23 (×2): qty 4

## 2015-01-23 MED ORDER — ONDANSETRON HCL 4 MG PO TABS: 4.0000 mg | ORAL_TABLET | Freq: Four times a day (QID) | ORAL | Status: DC | PRN

## 2015-01-23 MED ORDER — DEXTROSE 50 % IV SOLN: 25.0000 mL | INTRAVENOUS | Status: DC | PRN

## 2015-01-23 MED ORDER — VANCOMYCIN HCL IN DEXTROSE 1-5 GM/200ML-% IV SOLN
1000.0000 mg | Freq: Three times a day (TID) | INTRAVENOUS | Status: DC
  Administered 2015-01-23 – 2015-01-24 (×2): 1000 mg via INTRAVENOUS
  Filled 2015-01-23 (×5): qty 200

## 2015-01-23 MED ORDER — INSULIN ASPART 100 UNIT/ML ~~LOC~~ SOLN
16.0000 [IU] | Freq: Once | SUBCUTANEOUS | Status: AC
  Administered 2015-01-23: 16 [IU] via SUBCUTANEOUS
  Filled 2015-01-23: qty 1

## 2015-01-23 MED ORDER — ACETAMINOPHEN 325 MG PO TABS: 650.0000 mg | ORAL_TABLET | Freq: Four times a day (QID) | ORAL | Status: DC | PRN

## 2015-01-23 MED ORDER — CEFEPIME HCL 1 G IJ SOLR
1.0000 g | Freq: Three times a day (TID) | INTRAMUSCULAR | Status: DC
  Administered 2015-01-23 – 2015-01-24 (×3): 1 g via INTRAVENOUS
  Filled 2015-01-23 (×6): qty 1

## 2015-01-23 NOTE — ED Notes (Signed)
Pt requested [place] sandwich to eat. Pt given [place] sandwich with 1 slice of bread and coffee to drink ok per RN Becki.

## 2015-01-23 NOTE — ED Provider Notes (Addendum)
CSN: [id number]     Arrival date & time 01/23/15  13 History   First MD Initiated Contact with Patient 01/23/15 1212     Chief Complaint  Patient presents with  . Cough     (Consider location/radiation/quality/duration/timing/severity/associated sxs/prior Treatment) Patient is a 29 y.o. male presenting with cough. The history is provided by the patient.  Cough Associated symptoms: no chest pain, no chills, no fever, no headaches, no rash, no shortness of breath and no sore throat   pt c/o productive cough, yellowish sputum for the past couple days. Cough episodic, persistent. No chest pain or discomfort. No sob. No sore throat or runny nose. No fever or chills. No abd pain. No nvd. No known ill contacts, but does live in shelter. No wt loss. No night sweats.   Pt then goes off on tangent about the 'powers than be in the world' trying to control his thoughts and manipulate his being, and states he is very anxious about his thoughts trying to be controlled by others. Pt reports auditory hallucinations.  Pt unsure what the voices are saying. Denies thoughts of harm to self or others. Very limited insight into condition. Not compliant w meds.     Past Medical History  Diagnosis Date  . Diabetes mellitus   . Depression   . Schizo affective schizophrenia   . Polysubstance abuse   . Scoliosis   . Chronic pain   . Noncompliance with medication regimen   . DKA (diabetic ketoacidoses) 11/18/2014  . GERD (gastroesophageal reflux disease)   . Arthritis     "both hips" (12/21/2014)   History reviewed. No pertinent past surgical history. Family History  Problem Relation Age of Onset  . Diabetes Mother    History  Substance Use Topics  . Smoking status: Current Some Day Smoker -- 1.00 packs/day for 7 years    Types: Cigarettes  . Smokeless tobacco: Never Used  . Alcohol Use: Yes     Comment: 12/21/2014 "I drink on a rare occasion"    Review of Systems  Constitutional: Negative for  fever and chills.  HENT: Negative for sore throat.   Eyes: Negative for redness.  Respiratory: Positive for cough. Negative for shortness of breath.   Cardiovascular: Negative for chest pain.  Gastrointestinal: Negative for vomiting, abdominal pain and diarrhea.  Genitourinary: Negative for flank pain.  Musculoskeletal: Negative for back pain and neck pain.  Skin: Negative for rash.  Neurological: Negative for headaches.  Hematological: Does not bruise/bleed easily.  Psychiatric/Behavioral: Positive for hallucinations. Negative for suicidal ideas. The patient is nervous/anxious.       Allergies  Sulfa antibiotics  Home Medications   Prior to Admission medications   Medication Sig Start Date End Date Taking? Authorizing Provider  acetaminophen-codeine (TYLENOL #3) 300-30 MG per tablet Take 1 tablet by mouth every 4 (four) hours as needed. 12/24/14   Tresa Garter, MD  amoxicillin (AMOXIL) 500 MG capsule Take 2 capsules (1,000 mg total) by mouth 3 (three) times daily. 12/30/14   Harden Mo, MD  amphetamine-dextroamphetamine (ADDERALL) 20 MG tablet Take 2 tablets (40 mg total) by mouth 2 (two) times daily. 12/24/14   Tresa Garter, MD  benzonatate (TESSALON) 200 MG capsule Take 1 capsule (200 mg total) by mouth 3 (three) times daily as needed for cough. 12/30/14   Harden Mo, MD  doxepin (SINEQUAN) 10 MG/ML solution Take 10 mLs (100 mg total) by mouth at bedtime. Patient not taking: Reported on 12/24/2014 12/22/14  Ripudeep Krystal Eaton, MD  fluticasone (FLONASE) 50 MCG/ACT nasal spray Place 2 sprays into both nostrils daily. 12/30/14   Harden Mo, MD  glucose blood (FREESTYLE LITE) test strip Use as instructed 12/24/14   Tresa Garter, MD  glucose monitoring kit (FREESTYLE) monitoring kit 1 each by Does not apply route as needed for other. Test blood sugars twice daily and when needed 01/12/15   Tresa Garter, MD  insulin aspart (NOVOLOG) 100 UNIT/ML injection Inject 10  Units into the skin 3 (three) times daily before meals. Per sliding scale 12/22/14   Ripudeep K Rai, MD  insulin detemir (LEVEMIR) 100 UNIT/ML injection Inject 0.35 mLs (35 Units total) into the skin daily. 12/22/14   Ripudeep Krystal Eaton, MD  INSULIN SYRINGE .5CC/29G 29G X 1/2" 0.5 ML MISC 1 vial by Does not apply route 2 (two) times daily. 12/22/14   Lacy Duverney, PA-C  Insulin Syringe-Needle U-100 30G X 5/16" 0.5 ML MISC Test blood glucose 3 times daily 12/24/14   Tresa Garter, MD  Lancets (FREESTYLE) lancets Use as instructed 12/24/14   Tresa Garter, MD  varenicline (CHANTIX STARTING MONTH PAK) 0.5 MG X 11 & 1 MG X 42 tablet Take one 0.5 mg tablet by mouth once daily for 3 days, then increase to one 0.5 mg tablet twice daily for 4 days, then increase to one 1 mg tablet twice daily. 12/24/14   Tresa Garter, MD   BP 126/88 mmHg  Pulse 114  Temp(Src) 98.4 F (36.9 C) (Oral)  Resp 18  SpO2 99% Physical Exam  Constitutional: He is oriented to person, place, and time. He appears well-developed and well-nourished. No distress.  HENT:  Head: Atraumatic.  Mouth/Throat: Oropharynx is clear and moist.  Eyes: Conjunctivae are normal. No scleral icterus.  Neck: Neck supple. No tracheal deviation present.  No stiffness or rigidity  Cardiovascular: Normal rate, regular rhythm, normal heart sounds and intact distal pulses.  Exam reveals no gallop and no friction rub.   No murmur heard. Pulmonary/Chest: Effort normal and breath sounds normal. No accessory muscle usage. No respiratory distress. He exhibits no tenderness.  Abdominal: Soft. He exhibits no distension. There is no tenderness.  Musculoskeletal: Normal range of motion. He exhibits no edema or tenderness.  Lymphadenopathy:    He has no cervical adenopathy.  Neurological: He is alert and oriented to person, place, and time.  Steady gait.   Skin: Skin is warm and dry. No rash noted.  Psychiatric:  Mildly anxious.   Nursing note and  vitals reviewed.   ED Course  Procedures (including critical care time)   Results for orders placed or performed during the hospital encounter of 01/23/15  CBC  Result Value Ref Range   WBC 12.9 (H) 4.0 - 10.5 K/uL   RBC 4.21 (L) 4.22 - 5.81 MIL/uL   Hemoglobin 12.3 (L) 13.0 - 17.0 g/dL   HCT 36.5 (L) 39.0 - 52.0 %   MCV 86.7 78.0 - 100.0 fL   MCH 29.2 26.0 - 34.0 pg   MCHC 33.7 30.0 - 36.0 g/dL   RDW 13.0 11.5 - 15.5 %   Platelets 181 150 - 400 K/uL  Basic metabolic panel  Result Value Ref Range   Sodium 130 (L) 135 - 145 mmol/L   Potassium 4.1 3.5 - 5.1 mmol/L   Chloride 97 96 - 112 mmol/L   CO2 20 19 - 32 mmol/L   Glucose, Bld 651 (HH) 70 - 99 mg/dL  BUN 9 6 - 23 mg/dL   Creatinine, Ser 0.97 0.50 - 1.35 mg/dL   Calcium 8.4 8.4 - 10.5 mg/dL   GFR calc non Af Amer >90 >90 mL/min   GFR calc Af Amer >90 >90 mL/min   Anion gap 13 5 - 15   Dg Chest 2 View  01/23/2015   CLINICAL DATA:  Productive cough for 3 days with shortness of breath.  EXAM: CHEST  2 VIEW  COMPARISON:  06/17/2012.  FINDINGS: Trachea is midline. Heart size normal. There is airspace opacification in the lingula. Question early/ mild involvement of the left lower lobe as well. Lungs are otherwise clear. No pleural fluid.  IMPRESSION: Lingular pneumonia. Question mild/ early involvement of the left lower lobe as well.   Electronically Signed   By: Lorin Picket M.D.   On: 01/23/2015 13:41       MDM   Cxr.  Reviewed nursing notes and prior charts for additional history.   1 liter ns iv.   Po fluids.  Blood sugar very high. hco3 normal.  Pt not compliant w meds, including his diabetic meds and psych meds.  novolog sq.  Will restart home meds.   Given hallucinations, anxiety, psychiatric symptoms, will get psych team to assess.  SW to assess for living situation and meds.   Given blood sugars > 600 and pna, will admit.   Additional ns iv bolus.  Pt was admitted last month with hyperglycemia, as  such, will rx for possible hcap, zosyn and vanc iv.   Discussed pt with hospitalist, Dr Candiss Norse - they will see/admit.  Will place psych/telepsych consult.      Mirna Mires, MD 01/23/15 [phone]

## 2015-01-23 NOTE — Progress Notes (Signed)
On-call NP called again due to pt still not wanting to have sitter and was asked if he could assess him at this time to make the decision.  NP still not comfortable with decision and said MD in morning should make that call. NP said to call back if pt still insistent. Pt is calm at this time, seems rather pleasant and is eating sandwich. Will continue to monitor.

## 2015-01-23 NOTE — Progress Notes (Signed)
Pt became very upset and began shouting very soon after I was done with shift report. I went in to see what was going on and found pt on the phone with nutritional services shouting at them. Pt had ordered a bacon cheeseburger for supper and had not yet received it. Nutritional had called pt to say the grill was closed and they were unable to make the burger; this made pt very upset.  N[employer]ritional services called me not long after I entered pt's room to inform me of the problem; pt was on the phone with another member of [employer] when I received the call. Pt got off the phone and was still very upset. I tried to explain that the cheeseburger would not be coming up tonight and he would not take no for an answer. Pt stated he either wanted the cheeseburger or wanted his AMA papers to leave. I again tried to explain I couldn't get the cheeseburger but would gladly get him something else. He them demanded the [facility] papers. I informed the charge nurse of the situation and went to get the papers. I went back into pt's room to educate him on the reason he should stay but before I could say anything he said he had changed his mind and was going to stay but wanted to see the charge nurse, Hospital administration, and still wanted the cheeseburger. Pt stated, "I don't care how you get the cheeseburger, just get it for me now!". I went and got the charge nurse. Charge nurse was able to talk to pt and he started to calm down a bit. Charge nurse told him we could get him subway and he was ok with that. I took his order and he calmed down. I paged on call NP and asked for anti-anxiety medication as well. Order given. Pt was pleased with sandwich. Will continue to monitor at this time.

## 2015-01-23 NOTE — Progress Notes (Signed)
Pt refused to change into paper scrubs. Will cont to monitor. Halo Shevlin, [facility]

## 2015-01-23 NOTE — ED Notes (Signed)
VERIFIED W/PHARMACIST MAXIPIME IS COMPATIBLE W/INSULIN INFUSION

## 2015-01-23 NOTE — ED Notes (Signed)
Zosyn started - Vanc completed - 3rd liter NS infusing

## 2015-01-23 NOTE — ED Notes (Signed)
Pt. Feeling anxious and wants an ativan.

## 2015-01-23 NOTE — Progress Notes (Signed)
ANTIBIOTIC CONSULT NOTE - INITIAL  Pharmacy Consult for vancomycin Indication: rule out pneumonia  Allergies  Allergen Reactions  . Sulfa Antibiotics Other (See Comments)    Unknown - Childhood    Patient Measurements:   Adjusted Body Weight:   Vital Signs: Temp: 98.2 F (36.8 C) (02/06 1518) Temp Source: Oral (02/06 1518) BP: 122/87 mmHg (02/06 1515) Pulse Rate: 96 (02/06 1515) Intake/Output from previous day:   Intake/Output from this shift: Total I/O In: 2000 [I.V.:2000] Out: -   Labs:  Recent Labs  01/23/15 1250  WBC 12.9*  HGB 12.3*  PLT 181  CREATININE 0.97   CrCl cannot be calculated (Unknown ideal weight.). No results for input(s): VANCOTROUGH, VANCOPEAK, VANCORANDOM, GENTTROUGH, GENTPEAK, GENTRANDOM, TOBRATROUGH, TOBRAPEAK, TOBRARND, AMIKACINPEAK, AMIKACINTROU, AMIKACIN in the last 72 hours.   Microbiology: No results found for this or any previous visit (from the past 720 hour(s)).  Medical History: Past Medical History  Diagnosis Date  . Diabetes mellitus   . Depression   . Schizo affective schizophrenia   . Polysubstance abuse   . Scoliosis   . Chronic pain   . Noncompliance with medication regimen   . DKA (diabetic ketoacidoses) 11/18/2014  . GERD (gastroesophageal reflux disease)   . Arthritis     "both hips" (12/21/2014)    Medications:  Anti-infectives    Start     Dose/Rate Route Frequency Ordered Stop   01/23/15 2230  vancomycin (VANCOCIN) IVPB 1000 mg/200 mL premix     1,000 mg200 mL/hr over 60 Minutes Intravenous Every 8 hours 01/23/15 1529     01/23/15 1530  ceFEPIme (MAXIPIME) 1 g in dextrose 5 % 50 mL IVPB     1 g100 mL/hr over 30 Minutes Intravenous 3 times per day 01/23/15 1517 01/31/15 1359   01/23/15 1415  cefTRIAXone (ROCEPHIN) 1 g in dextrose 5 % 50 mL IVPB  Status:  Discontinued     1 g100 mL/hr over 30 Minutes Intravenous  Once 01/23/15 1408 01/23/15 1409   01/23/15 1415  azithromycin (ZITHROMAX) 500 mg in dextrose 5 %  250 mL IVPB  Status:  Discontinued     500 mg250 mL/hr over 60 Minutes Intravenous  Once 01/23/15 1408 01/23/15 1409   01/23/15 1415  piperacillin-tazobactam (ZOSYN) IVPB 3.375 g     3.375 g100 mL/hr over 30 Minutes Intravenous  Once 01/23/15 1409 01/23/15 1502   01/23/15 1415  vancomycin (VANCOCIN) IVPB 1000 mg/200 mL premix     1,000 mg200 mL/hr over 60 Minutes Intravenous  Once 01/23/15 1409 01/23/15 1519     Assessment: 28 yom presented to the ED with a cough. Loaded with cefepime and vancomycin. Now to continue both for treatment of pneumonia. Pt is afebrile and WBC is 12.9. Scr is WNL at 0.97.  Vanc 2/6>> Cefepime 2/6>>  Goal of Therapy:  Vancomycin trough level 15-20 mcg/ml  Plan:  1. Vancomycin 1gm IV Q8H 2. F/u renal fxn, C&S, clinical status and trough at [facility]  Zana Biancardi, [facility] Leach 01/23/2015,3:32 PM

## 2015-01-23 NOTE — ED Notes (Addendum)
Here last week for lung infection; taking pcn for the infection; but states, "unresolved." from [facility].

## 2015-01-23 NOTE — H&P (Signed)
Patient Demographics  Lemond Griffee, is a 29 y.o. male  MRN: [id number]   DOB - 04-May-1986  Admit Date - 01/23/2015  Outpatient Primary MD for the patient is Angelica Chessman, MD   With History of -  Past Medical History  Diagnosis Date  . Diabetes mellitus   . Depression   . Schizo affective schizophrenia   . Polysubstance abuse   . Scoliosis   . Chronic pain   . Noncompliance with medication regimen   . DKA (diabetic ketoacidoses) 11/18/2014  . GERD (gastroesophageal reflux disease)   . Arthritis     "both hips" (12/21/2014)      History reviewed. No pertinent past surgical history.  in for   Chief Complaint  Patient presents with  . Cough     HPI  Dontae Minerva  is a 29 y.o. male, with history of type 1 diabetes mellitus, noncompliant, schizophrenia, who currently is homeless and living at [place], comes in with 3-4 day history of cough, fever and shortness of breath, cough is productive. No palpitations. No exposure to sick contacts, he has also stopped taking his insulin because it doesn't make him feel good, he is having some bizarre ideations where he thinks that Powers to be in the world at controlling his thoughts.  In the ER workup consistent with HCAP as he was recently admitted for hyperglycemia, his glucose was over 600, he was dehydrated with hyponatremia and I was called to admit the patient. Psych was consulted by ER for schizophrenia.    Review of Systems    In addition to the HPI above,   No Fever-chills, No Headache, No changes with Vision or hearing, No problems swallowing food or Liquids, No Chest pain, ++ Cough & Shortness of Breath, No Abdominal pain, No Nausea or Vommitting, Bowel movements are regular, No Blood in stool or Urine, No dysuria, No new skin  rashes or bruises, No new joints pains-aches,  No new weakness, tingling, numbness in any extremity, No recent weight gain or loss, No polyuria, polydypsia or polyphagia, +ve Mental Stressors.  A full 10 point Review of Systems was done, except as stated above, all other Review of Systems were negative.   Social History History  Substance Use Topics  . Smoking status: Current Some Day Smoker -- 1.00 packs/day for 7 years    Types: Cigarettes  . Smokeless tobacco: Never Used  . Alcohol Use: Yes     Comment: 12/21/2014 "I drink on a rare occasion"      Family History Family History  Problem Relation Age of Onset  . Diabetes Mother       Prior to Admission medications   Medication Sig Start Date End Date Taking? Authorizing Provider  amphetamine-dextroamphetamine (ADDERALL) 20 MG tablet Take 2 tablets (40 mg total) by mouth 2 (two) times daily. 12/24/14  Yes Tresa Garter, MD  doxepin (SINEQUAN) 10 MG/ML solution Take 10 mLs (  100 mg total) by mouth at bedtime. 12/22/14  Yes Ripudeep Krystal Eaton, MD  fluticasone (FLONASE) 50 MCG/ACT nasal spray Place 2 sprays into both nostrils daily. 12/30/14  Yes Harden Mo, MD  acetaminophen-codeine (TYLENOL #3) 300-30 MG per tablet Take 1 tablet by mouth every 4 (four) hours as needed. 12/24/14   Tresa Garter, MD  amoxicillin (AMOXIL) 500 MG capsule Take 2 capsules (1,000 mg total) by mouth 3 (three) times daily. 12/30/14   Harden Mo, MD  benzonatate (TESSALON) 200 MG capsule Take 1 capsule (200 mg total) by mouth 3 (three) times daily as needed for cough. 12/30/14   Harden Mo, MD  glucose blood (FREESTYLE LITE) test strip Use as instructed 12/24/14   Tresa Garter, MD  glucose monitoring kit (FREESTYLE) monitoring kit 1 each by Does not apply route as needed for other. Test blood sugars twice daily and when needed 01/12/15   Tresa Garter, MD  insulin aspart (NOVOLOG) 100 UNIT/ML injection Inject 10 Units into the skin 3  (three) times daily before meals. Per sliding scale Patient taking differently: Inject 3-10 Units into the skin 3 (three) times daily before meals. Per sliding scale 12/22/14   Ripudeep K Rai, MD  insulin detemir (LEVEMIR) 100 UNIT/ML injection Inject 0.35 mLs (35 Units total) into the skin daily. 12/22/14   Ripudeep Krystal Eaton, MD  INSULIN SYRINGE .5CC/29G 29G X 1/2" 0.5 ML MISC 1 vial by Does not apply route 2 (two) times daily. 12/22/14   Lacy Duverney, PA-C  Insulin Syringe-Needle U-100 30G X 5/16" 0.5 ML MISC Test blood glucose 3 times daily 12/24/14   Tresa Garter, MD  Lancets (FREESTYLE) lancets Use as instructed 12/24/14   Tresa Garter, MD  varenicline (CHANTIX STARTING MONTH PAK) 0.5 MG X 11 & 1 MG X 42 tablet Take one 0.5 mg tablet by mouth once daily for 3 days, then increase to one 0.5 mg tablet twice daily for 4 days, then increase to one 1 mg tablet twice daily. Patient not taking: Reported on 01/23/2015 12/24/14   Tresa Garter, MD    Allergies  Allergen Reactions  . Sulfa Antibiotics Other (See Comments)    Unknown - Childhood    Physical Exam  Vitals  Blood pressure 122/87, pulse 96, temperature 98.2 F (36.8 C), temperature source Oral, resp. rate 20, SpO2 100 %.   1. General Young disheveled white male lying in bed in NAD,     2. Bizzare affect and insight, Not Suicidal or Homicidal, Awake Alert, Oriented X 3.  3. No F.N deficits, ALL C.Nerves Intact, Strength 5/5 all 4 extremities, Sensation intact all 4 extremities, Plantars down going.  4. Ears and Eyes appear Normal, Conjunctivae clear, PERRLA. Moist Oral Mucosa.  5. Supple Neck, No JVD, No cervical lymphadenopathy appriciated, No Carotid Bruits.  6. Symmetrical Chest wall movement, Good air movement bilaterally, L sided rales  7. RRR, No Gallops, Rubs or Murmurs, No Parasternal Heave.  8. Positive Bowel Sounds, Abdomen Soft, No tenderness, No organomegaly appriciated,No rebound -guarding or rigidity.  9.   No Cyanosis, Normal Skin Turgor, No Skin Rash or Bruise.  10. Good muscle tone,  joints appear normal , no effusions, Normal ROM.  11. No Palpable Lymph Nodes in Neck or Axillae     Data Review  CBC  Recent Labs Lab 01/23/15 1250  WBC 12.9*  HGB 12.3*  HCT 36.5*  PLT 181  MCV 86.7  MCH 29.2  MCHC  33.7  RDW 13.0   ------------------------------------------------------------------------------------------------------------------  Chemistries   Recent Labs Lab 01/23/15 1250  NA 130*  K 4.1  CL 97  CO2 20  GLUCOSE 651*  BUN 9  CREATININE 0.97  CALCIUM 8.4   ------------------------------------------------------------------------------------------------------------------ CrCl cannot be calculated (Unknown ideal weight.). ------------------------------------------------------------------------------------------------------------------ No results for input(s): TSH, T4TOTAL, T3FREE, THYROIDAB in the last 72 hours.  Invalid input(s): FREET3   Coagulation profile No results for input(s): INR, PROTIME in the last 168 hours. ------------------------------------------------------------------------------------------------------------------- No results for input(s): DDIMER in the last 72 hours. -------------------------------------------------------------------------------------------------------------------  Cardiac Enzymes No results for input(s): CKMB, TROPONINI, MYOGLOBIN in the last 168 hours.  Invalid input(s): CK ------------------------------------------------------------------------------------------------------------------ Invalid input(s): POCBNP   ---------------------------------------------------------------------------------------------------------------  Urinalysis    Component Value Date/Time   COLORURINE YELLOW 12/21/2014 1435   APPEARANCEUR CLEAR 12/21/2014 1435   LABSPEC 1.031* 12/21/2014 1435   PHURINE 6.0 12/21/2014 1435   GLUCOSEU >1000*  12/21/2014 1435   HGBUR NEGATIVE 12/21/2014 1435   BILIRUBINUR NEGATIVE 12/21/2014 1435   KETONESUR 15* 12/21/2014 1435   PROTEINUR NEGATIVE 12/21/2014 1435   UROBILINOGEN 0.2 12/21/2014 1435   NITRITE NEGATIVE 12/21/2014 1435   LEUKOCYTESUR NEGATIVE 12/21/2014 1435    ----------------------------------------------------------------------------------------------------------------  Imaging results:   Dg Chest 2 View  01/23/2015   CLINICAL DATA:  Productive cough for 3 days with shortness of breath.  EXAM: CHEST  2 VIEW  COMPARISON:  06/17/2012.  FINDINGS: Trachea is midline. Heart size normal. There is airspace opacification in the lingula. Question early/ mild involvement of the left lower lobe as well. Lungs are otherwise clear. No pleural fluid.  IMPRESSION: Lingular pneumonia. Question mild/ early involvement of the left lower lobe as well.   Electronically Signed   By: Lorin Picket M.D.   On: 01/23/2015 13:41         Assessment & Plan  1. HCAP - admit, sputum and blood cultures, empiric IV antibiotics, supportive care. Follow HIV, strep pneumo and Legoenella  urine antigen.  2. DM type I in poor control. Bicarbonate is stable, Is borderline, will continue his Levemir in split twice a day dose, glucose stabilizer drip. IV fluids. Check A1c. Counseled on compliance.   3. Schizophrenia with bizarre ideation. Psych consulted by EDP. May need placement as homeless.   4. Dehydration and hyponatremia. Due to #2. Hydrate.   DVT Prophylaxis   Lovenox    AM Labs Ordered, also please review Full Orders  Family Communication: Admission, patients condition and plan of care including tests being ordered have been discussed with the patient  who indicates understanding and agree with the plan and Code Status.  Code Status Full  Likely DC to  TBD  Condition Fair  Time spent in minutes : 35    Bairon Klemann K M.D on 01/23/2015 at 3:33 PM  Between 7am to 7pm - Pager -  [phone]  After 7pm go to [email or web address] - [facility] Office  [phone]

## 2015-01-23 NOTE — Progress Notes (Signed)
Pt insisting on not wanting to have a sitter in his room anymore. Pt states he does not have any suicidal ideations and does not want to be 1:1. MD notes also state pt is not suicidal but does want someone in there with him. On-call NP called to see if there was anyway to take away sitter. NP did not feel comfortable with taking order off at this time. Will continue to monitor.

## 2015-01-24 LAB — GLUCOSE, CAPILLARY
Glucose-Capillary: 122 mg/dL — ABNORMAL HIGH (ref 70–99)
Glucose-Capillary: 130 mg/dL — ABNORMAL HIGH (ref 70–99)
Glucose-Capillary: 159 mg/dL — ABNORMAL HIGH (ref 70–99)
Glucose-Capillary: 167 mg/dL — ABNORMAL HIGH (ref 70–99)
Glucose-Capillary: 168 mg/dL — ABNORMAL HIGH (ref 70–99)
Glucose-Capillary: 181 mg/dL — ABNORMAL HIGH (ref 70–99)
Glucose-Capillary: 249 mg/dL — ABNORMAL HIGH (ref 70–99)
Glucose-Capillary: 169 mg/dL — ABNORMAL HIGH (ref 70–99)
Glucose-Capillary: 195 mg/dL — ABNORMAL HIGH (ref 70–99)
Glucose-Capillary: 142 mg/dL — ABNORMAL HIGH (ref 70–99)

## 2015-01-24 LAB — BASIC METABOLIC PANEL
Anion gap: 6 (ref 5–15)
BUN: 9 mg/dL (ref 6–23)
CALCIUM: 8.4 mg/dL (ref 8.4–10.5)
CO2: 29 mmol/L (ref 19–32)
Chloride: 101 mmol/L (ref 96–112)
Creatinine, Ser: 0.47 mg/dL — ABNORMAL LOW (ref 0.50–1.35)
GFR calc Af Amer: >90 mL/min (ref >90)
GFR calc non Af Amer: >90 mL/min (ref >90)
Glucose, Bld: 226 mg/dL — ABNORMAL HIGH (ref 70–99)
Potassium: 3.7 mmol/L (ref 3.5–5.1)
SODIUM: 136 mmol/L (ref 135–145)

## 2015-01-24 LAB — CBC
HCT: 33 % — ABNORMAL LOW (ref 39.0–52.0)
HEMOGLOBIN: 11.1 g/dL — AB (ref 13.0–17.0)
MCH: 28.8 pg (ref 26.0–34.0)
MCHC: 33.6 g/dL (ref 30.0–36.0)
MCV: 85.7 fL (ref 78.0–100.0)
Platelets: 180 10*3/uL (ref 150–400)
RBC: 3.85 MIL/uL — AB (ref 4.22–5.81)
RDW: 12.9 % (ref 11.5–15.5)
WBC: 8.7 10*3/uL (ref 4.0–10.5)

## 2015-01-24 MED ORDER — INSULIN ASPART 100 UNIT/ML ~~LOC~~ SOLN: 0.0000 [IU] | Freq: Three times a day (TID) | SUBCUTANEOUS | Status: DC

## 2015-01-24 MED ORDER — INSULIN DETEMIR 100 UNIT/ML ~~LOC~~ SOLN
20.0000 [IU] | Freq: Every day | SUBCUTANEOUS | Status: DC
  Administered 2015-01-24: 20 [IU] via SUBCUTANEOUS
  Filled 2015-01-24: qty 0.2

## 2015-01-24 MED ORDER — ENOXAPARIN SODIUM 40 MG/0.4ML ~~LOC~~ SOLN: 40.0000 mg | SUBCUTANEOUS | Status: DC

## 2015-01-24 MED ORDER — ENOXAPARIN SODIUM 30 MG/0.3ML ~~LOC~~ SOLN
30.0000 mg | SUBCUTANEOUS | Status: DC
  Filled 2015-01-24: qty 0.3

## 2015-01-24 NOTE — Progress Notes (Signed)
Pt continues to be agitated and verbally aggressive to staff.  Pt requesting to leave AMA and asking to speak to administration.  Reinforced to pt that he could not leave AMA until he was seen by psych.  Pt stated that he "refused all treatment" until he was allowed to sign AMA.  MD notified.  Will cont to monitor. Jailon Schaible, [facility]

## 2015-01-24 NOTE — Progress Notes (Addendum)
After Multiple  discussions with the patient's, and explaining to him that he should be cleared by psychiatry before he is able to leave AMA, it appears patient had left the building, IVC papers were already signed by me, where in the process getting them approved by the Magistrate when the patient left. Huey Bienenstock MD

## 2015-01-24 NOTE — Progress Notes (Signed)
Utilization review completed.  

## 2015-01-24 NOTE — Progress Notes (Signed)
Patient Demographics  [name], is a 29 y.o. male, DOB - 02-19-1986, QIO:[id number]  Admit date - 01/23/2015   Admitting Physician Leroy Sea, MD  Outpatient Primary MD for the patient is Jeanann Lewandowsky, MD  LOS - 1   Chief Complaint  Patient presents with  . Cough       Admission history of present illness/brief narrative; [name] is a 29 y.o. male, with history of type 1 diabetes mellitus, noncompliant, schizophrenia, who currently is homeless and living at [facility], comes in with 3-4 day history of cough, fever and shortness of breath, cough is productive. No palpitations. No exposure to sick contacts, he has also stopped taking his insulin because it doesn't make him feel good, he is having some bizarre ideations where he thinks that Powers to be in the world at controlling his thoughts. In the ER workup consistent with HCAP as he was recently admitted for hyperglycemia, his glucose was over 600, he was dehydrated with hyponatremia , patient was started on IV vancomycin and ceftazidime, started on IV glucose stabilizer, patient was kept on one-to-one observation given his paranoia, patient was extremely agitated, combative and hostile to staff,and to me personally ( he stated I'll come after you )as he didn't like me telling him we have to wait for psychiatric consult before we can discontinue one-to-one monitoring,( where psychiatric has been consulted.     Subjective:   [name] today is extremely agitated and combative, hostile and verbally threatening, noncompliant with physical exam.  Assessment & Plan    Principal Problem:   HCAP (healthcare-associated pneumonia) Active Problems:   Schizophrenia, undifferentiated   Diabetes type 1, uncontrolled   Dehydration  Healthcare acquired pneumonia - Continue with IV  vancomycin and Cefepime - Follow on blood cultures and respiratory cultures - Negative strep pneumonia urinary antigen - Follow on Legionella antigen, HIV test  Type 1 diabetes - Uncontrolled, given patient is not compliant with his medication. - ABG is currently controlled, will DC IV glucose stabilizer, will transition to Levemir 20 units daily, and insulin sliding-scale, will adjust Levemir dose as needed. - Follow on hemoglobin A1c  History of schizophrenia - Patient presents with active paranoia, style and combative to staff. - continue 1:1 till seen by psychiatric    Code Status: Full  Family Communication: None at bedside  Disposition Plan: Await psychiatric evaluation   Procedures  None   Consults   Requested psychiatric consult   Medications  Scheduled Meds: . amphetamine-dextroamphetamine  40 mg Oral BID  . ceFEPime (MAXIPIME) IV  1 g Intravenous 3 times per day  . insulin aspart  0-9 Units Subcutaneous TID WC  . insulin detemir  20 Units Subcutaneous Daily  . vancomycin  1,000 mg Intravenous Q8H   Continuous Infusions:  PRN Meds:.acetaminophen **OR** acetaminophen, alum & mag hydroxide-simeth, dextrose, guaiFENesin-dextromethorphan, HYDROcodone-acetaminophen, ondansetron **OR** ondansetron (ZOFRAN) IV, polyethylene glycol  DVT Prophylaxis  Lovenox  Lab Results  Component Value Date   PLT 180 01/24/2015    Antibiotics    Anti-infectives    Start     Dose/Rate Route Frequency Ordered Stop   01/23/15 2230  vancomycin (VANCOCIN) IVPB 1000 mg/200 mL premix     1,000 mg200 mL/hr over 60  Minutes Intravenous Every 8 hours 01/23/15 1529     01/23/15 1530  ceFEPIme (MAXIPIME) 1 g in dextrose 5 % 50 mL IVPB     1 g100 mL/hr over 30 Minutes Intravenous 3 times per day 01/23/15 1517 01/31/15 1359   01/23/15 1415  cefTRIAXone (ROCEPHIN) 1 g in dextrose 5 % 50 mL IVPB  Status:  Discontinued     1 g100 mL/hr over 30 Minutes Intravenous  Once 01/23/15 1408  01/23/15 1409   01/23/15 1415  azithromycin (ZITHROMAX) 500 mg in dextrose 5 % 250 mL IVPB  Status:  Discontinued     500 mg250 mL/hr over 60 Minutes Intravenous  Once 01/23/15 1408 01/23/15 1409   01/23/15 1415  piperacillin-tazobactam (ZOSYN) IVPB 3.375 g     3.375 g100 mL/hr over 30 Minutes Intravenous  Once 01/23/15 1409 01/23/15 1605   01/23/15 1415  vancomycin (VANCOCIN) IVPB 1000 mg/200 mL premix     1,000 mg200 mL/hr over 60 Minutes Intravenous  Once 01/23/15 1409 01/23/15 1519          Objective:   Filed Vitals:   01/23/15 2314 01/24/15 0335 01/24/15 0730 01/24/15 0802  BP: 116/63 96/68 110/73   Pulse: 110 103  100  Temp:  97.7 F (36.5 C)  98 F (36.7 C)  TempSrc:  Oral  Oral  Resp: 20 13  17   Height:      Weight:  65.2 kg (143 lb 11.8 oz)    SpO2: 96% 98%  100%    Wt Readings from Last 3 Encounters:  01/24/15 65.2 kg (143 lb 11.8 oz)  12/24/14 68.493 kg (151 lb)  12/21/14 67.631 kg (149 lb 1.6 oz)     Intake/Output Summary (Last 24 hours) at 01/24/15 1020 Last data filed at 01/24/15 0730  Gross per 24 hour  Intake 5277.28 ml  Output      0 ml  Net 5277.28 ml     Physical Exam  Patient is refusing physical exam.   Data Review   Micro Results Recent Results (from the past 240 hour(s))  MRSA PCR Screening     Status: None   Collection Time: 01/23/15  6:21 PM  Result Value Ref Range Status   MRSA by PCR NEGATIVE NEGATIVE Final    Comment:        The GeneXpert MRSA Assay (FDA approved for NASAL specimens only), is one component of a comprehensive MRSA colonization surveillance program. It is not intended to diagnose MRSA infection nor to guide or monitor treatment for MRSA infections.   Culture, sputum-assessment     Status: None   Collection Time: 01/23/15  6:48 PM  Result Value Ref Range Status   Specimen Description SPUTUM  Final   Special Requests NONE  Final   Sputum evaluation   Final    THIS SPECIMEN IS ACCEPTABLE. RESPIRATORY  CULTURE REPORT TO FOLLOW.   Report Status 01/23/2015 FINAL  Final    Radiology Reports Dg Chest 2 View  01/23/2015   CLINICAL DATA:  Productive cough for 3 days with shortness of breath.  EXAM: CHEST  2 VIEW  COMPARISON:  06/17/2012.  FINDINGS: Trachea is midline. Heart size normal. There is airspace opacification in the lingula. Question early/ mild involvement of the left lower lobe as well. Lungs are otherwise clear. No pleural fluid.  IMPRESSION: Lingular pneumonia. Question mild/ early involvement of the left lower lobe as well.   Electronically Signed   By: 08/18/2012 M.D.   On: 01/23/2015  13:41    CBC  Recent Labs Lab 01/23/15 1250 01/24/15 0332  WBC 12.9* 8.7  HGB 12.3* 11.1*  HCT 36.5* 33.0*  PLT 181 180  MCV 86.7 85.7  MCH 29.2 28.8  MCHC 33.7 33.6  RDW 13.0 12.9    Chemistries   Recent Labs Lab 01/23/15 1250 01/24/15 0332  NA 130* 136  K 4.1 3.7  CL 97 101  CO2 20 29  GLUCOSE 651* 226*  BUN 9 9  CREATININE 0.97 0.47*  CALCIUM 8.4 8.4   ------------------------------------------------------------------------------------------------------------------ estimated creatinine clearance is 124.5 mL/min (by C-G formula based on Cr of 0.47). ------------------------------------------------------------------------------------------------------------------ No results for input(s): HGBA1C in the last 72 hours. ------------------------------------------------------------------------------------------------------------------ No results for input(s): CHOL, HDL, LDLCALC, TRIG, CHOLHDL, LDLDIRECT in the last 72 hours. ------------------------------------------------------------------------------------------------------------------ No results for input(s): TSH, T4TOTAL, T3FREE, THYROIDAB in the last 72 hours.  Invalid input(s): FREET3 ------------------------------------------------------------------------------------------------------------------ No results for  input(s): VITAMINB12, FOLATE, FERRITIN, TIBC, IRON, RETICCTPCT in the last 72 hours.  Coagulation profile No results for input(s): INR, PROTIME in the last 168 hours.  No results for input(s): DDIMER in the last 72 hours.  Cardiac Enzymes No results for input(s): CKMB, TROPONINI, MYOGLOBIN in the last 168 hours.  Invalid input(s): CK ------------------------------------------------------------------------------------------------------------------ Invalid input(s): POCBNP     Time Spent in minutes   45 minutes   [name] M.D on 01/24/2015 at 10:20 AM  Between 7am to 7pm - Pager - [phone]  After 7pm go to [email or web address] - password [email or web address]  And look for the night coverage person covering for me after hours  [facility] Office  [phone]   **Disclaimer: This note may have been dictated with voice recognition software. Similar sounding words can inadvertently be transcribed and this note may contain transcription errors which may not have been corrected upon publication of note.**

## 2015-01-24 NOTE — Progress Notes (Signed)
Pt requested to speak to charge RN.  CN in to see pt.  Pt demanding to see psych MD or leave AMA.  Dr. Waldron Labs notified and up to see pt.  MD explained to pt that psych MD would not be available to evaluate pt until tonight.  Recommendation for telepsych consult was brought up.  BH was contacted for telepsych eval and arrangements were made for this to occur at the NP's convenience. Pt was agreeable to this.  Pt requested his personal belongings which were returned to him. Pt dressed and began to walk off of unit with sitter and RN.  Pt was met in ED lobby by [facility].  [place] PD attempted to deescalate pt and encouraged him to stay for telepsych eval.  Pt refused to return to room and left facility.  MD notified.  Kiani Wurtzel, [facility]

## 2015-01-24 NOTE — Progress Notes (Signed)
Pt became upset this morning after receiving disposable tray and plasticware at breakfast.  Pt repeatedly verbally abusive to staff regarding suicide precautions in place by hospital policy. Attempted to be redirected by numerous staff members without success.  MD at bedside for daily rounds.  Pt was verbally aggressive and abusive to him, refusing care.  Pt proceeded to cal 911, requesting officer take report of his "treatment by hospital staff".  Security and [place] police at bedside.  Pt was informed that precautions would be changed to elopement based on MDs assessment of situation and psych would be the final determinant of remaining precautionary measures.  Pt sat in chair in room, continually talking out loud with flight of ideas and ideations of being monitored by hospital administrators.  Pts voice loud and aggressive.  Pt was redirected and offered to shower.  After completing shower, pt refused to allow staff to reapply telemetry as pt stated he wanted to go outside for fresh air.  Explained step-down status and need for close monitoring and that pt was not allowed to leave floor without MD order and RN assistance.  Pt became more agitated, yelling continually in room.  MD notified of pts behavior.  Will continue to monitor closely.  Taziah Difatta, [facility]

## 2015-01-24 NOTE — Consult Note (Signed)
  Called 2H Heart Unit at [facility] for tele-psych. Spoke with Efraim Kaufmann, RN who states [name] "has left the building" and tele-psych services are not needed at this time. She states he did not sign AMA papers but they were unable to hold him on the unit due to IVC paperwork not being notarized. She states they are still proceeding with obtaining IVC and once obtained, [employer] will go to [facility] to pick him up.  Alberteen Sam, FNP-BC [facility]

## 2015-01-25 LAB — LEGIONELLA ANTIGEN, URINE

## 2015-01-25 LAB — GLUCOSE, CAPILLARY
Glucose-Capillary: 81 mg/dL (ref 70–99)
Glucose-Capillary: 106 mg/dL — ABNORMAL HIGH (ref 70–99)

## 2015-01-25 LAB — HEMOGLOBIN A1C
Hgb A1c MFr Bld: 12 % — ABNORMAL HIGH (ref 4.8–5.6)
Mean Plasma Glucose: 298 mg/dL

## 2015-01-25 LAB — HIV ANTIBODY (ROUTINE TESTING W REFLEX): HIV Screen 4th Generation wRfx: NONREACTIVE

## 2015-01-26 LAB — CULTURE, RESPIRATORY W GRAM STAIN

## 2015-01-26 LAB — CULTURE, RESPIRATORY

## 2015-01-26 NOTE — Discharge Summary (Signed)
Please review progress note on 2/7. - Patient had eloped, after refusing medical care.  Huey Bienenstock MD

## 2015-01-31 ENCOUNTER — Observation Stay (HOSPITAL_COMMUNITY)
Admission: AD | Admit: 2015-01-31 | Discharge: 2015-02-02 | Disposition: A | Discharge status: Home / Self Care | Payer: PPO | Attending: Internal Medicine | Admitting: Internal Medicine

## 2015-01-31 ENCOUNTER — Emergency Department (HOSPITAL_COMMUNITY): Payer: PPO

## 2015-01-31 ENCOUNTER — Encounter (HOSPITAL_COMMUNITY): Payer: Self-pay | Admitting: Emergency Medicine

## 2015-01-31 DIAGNOSIS — F250 Schizoaffective disorder, bipolar type: Secondary | ICD-10-CM | POA: Diagnosis not present

## 2015-01-31 DIAGNOSIS — F329 Major depressive disorder, single episode, unspecified: Secondary | ICD-10-CM | POA: Insufficient documentation

## 2015-01-31 DIAGNOSIS — E1310 Other specified diabetes mellitus with ketoacidosis without coma: Secondary | ICD-10-CM | POA: Insufficient documentation

## 2015-01-31 DIAGNOSIS — M1990 Unspecified osteoarthritis, unspecified site: Secondary | ICD-10-CM | POA: Diagnosis not present

## 2015-01-31 DIAGNOSIS — R739 Hyperglycemia, unspecified: Secondary | ICD-10-CM | POA: Diagnosis present

## 2015-01-31 DIAGNOSIS — Z794 Long term (current) use of insulin: Secondary | ICD-10-CM | POA: Insufficient documentation

## 2015-01-31 DIAGNOSIS — F203 Undifferentiated schizophrenia: Secondary | ICD-10-CM | POA: Diagnosis not present

## 2015-01-31 DIAGNOSIS — K219 Gastro-esophageal reflux disease without esophagitis: Secondary | ICD-10-CM | POA: Insufficient documentation

## 2015-01-31 DIAGNOSIS — Z79899 Other long term (current) drug therapy: Secondary | ICD-10-CM | POA: Diagnosis not present

## 2015-01-31 DIAGNOSIS — E1010 Type 1 diabetes mellitus with ketoacidosis without coma: Secondary | ICD-10-CM | POA: Diagnosis not present

## 2015-01-31 DIAGNOSIS — Z9114 Patient's other noncompliance with medication regimen: Secondary | ICD-10-CM | POA: Insufficient documentation

## 2015-01-31 DIAGNOSIS — R05 Cough: Secondary | ICD-10-CM | POA: Diagnosis present

## 2015-01-31 DIAGNOSIS — G8929 Other chronic pain: Secondary | ICD-10-CM | POA: Insufficient documentation

## 2015-01-31 DIAGNOSIS — J189 Pneumonia, unspecified organism: Principal | ICD-10-CM | POA: Diagnosis present

## 2015-01-31 DIAGNOSIS — Z720 Tobacco use: Secondary | ICD-10-CM | POA: Diagnosis not present

## 2015-01-31 DIAGNOSIS — F411 Generalized anxiety disorder: Secondary | ICD-10-CM | POA: Diagnosis present

## 2015-01-31 LAB — CBC WITH DIFFERENTIAL/PLATELET
BASOS ABS: 0 10*3/uL (ref 0.0–0.1)
BASOS PCT: 0 % (ref 0–1)
EOS ABS: 0.1 10*3/uL (ref 0.0–0.7)
Eosinophils Relative: 1 % (ref 0–5)
HEMATOCRIT: 39.2 % (ref 39.0–52.0)
Hemoglobin: 13.6 g/dL (ref 13.0–17.0)
LYMPHS ABS: 1.5 10*3/uL (ref 0.7–4.0)
LYMPHS PCT: 16 % (ref 12–46)
MCH: 29.8 pg (ref 26.0–34.0)
MCHC: 34.7 g/dL (ref 30.0–36.0)
MCV: 85.8 fL (ref 78.0–100.0)
MONO ABS: 0.6 10*3/uL (ref 0.1–1.0)
Monocytes Relative: 6 % (ref 3–12)
NEUTROS ABS: 7.1 10*3/uL (ref 1.7–7.7)
Neutrophils Relative %: 77 % (ref 43–77)
Platelets: 375 10*3/uL (ref 150–400)
RBC: 4.57 MIL/uL (ref 4.22–5.81)
RDW: 12.7 % (ref 11.5–15.5)
WBC: 9.2 10*3/uL (ref 4.0–10.5)

## 2015-01-31 LAB — URINALYSIS, ROUTINE W REFLEX MICROSCOPIC
BILIRUBIN URINE: NEGATIVE
Glucose, UA: >1000 mg/dL — AB
HGB URINE DIPSTICK: NEGATIVE
KETONES UR: 40 mg/dL — AB
Leukocytes, UA: NEGATIVE
Nitrite: NEGATIVE
Protein, ur: NEGATIVE mg/dL
SPECIFIC GRAVITY, URINE: 1.044 — AB (ref 1.005–1.030)
Urobilinogen, UA: 1 mg/dL (ref 0.0–1.0)
pH: 6.5 (ref 5.0–8.0)

## 2015-01-31 LAB — BASIC METABOLIC PANEL
Anion gap: 7 (ref 5–15)
BUN: 13 mg/dL (ref 6–23)
CO2: 30 mmol/L (ref 19–32)
CREATININE: 0.73 mg/dL (ref 0.50–1.35)
Calcium: 8.7 mg/dL (ref 8.4–10.5)
Chloride: 99 mmol/L (ref 96–112)
GFR calc non Af Amer: >90 mL/min (ref >90)
GLUCOSE: 244 mg/dL — AB (ref 70–99)
Potassium: 4 mmol/L (ref 3.5–5.1)
Sodium: 136 mmol/L (ref 135–145)

## 2015-01-31 LAB — GLUCOSE, CAPILLARY
GLUCOSE-CAPILLARY: 228 mg/dL — AB (ref 70–99)
Glucose-Capillary: 239 mg/dL — ABNORMAL HIGH (ref 70–99)
Glucose-Capillary: 128 mg/dL — ABNORMAL HIGH (ref 70–99)
Glucose-Capillary: 171 mg/dL — ABNORMAL HIGH (ref 70–99)
Glucose-Capillary: 256 mg/dL — ABNORMAL HIGH (ref 70–99)
Glucose-Capillary: 178 mg/dL — ABNORMAL HIGH (ref 70–99)
Glucose-Capillary: 145 mg/dL — ABNORMAL HIGH (ref 70–99)

## 2015-01-31 LAB — I-STAT CHEM 8, ED
BUN: 9 mg/dL (ref 6–23)
CHLORIDE: 92 mmol/L — AB (ref 96–112)
Calcium, Ion: 1.15 mmol/L (ref 1.12–1.23)
Creatinine, Ser: 0.6 mg/dL (ref 0.50–1.35)
Glucose, Bld: 504 mg/dL — ABNORMAL HIGH (ref 70–99)
HCT: 42 % (ref 39.0–52.0)
HEMOGLOBIN: 14.3 g/dL (ref 13.0–17.0)
Potassium: 3.9 mmol/L (ref 3.5–5.1)
SODIUM: 135 mmol/L (ref 135–145)
TCO2: 28 mmol/L (ref 0–100)

## 2015-01-31 LAB — CULTURE, BLOOD (ROUTINE X 2)
CULTURE: NO GROWTH
Culture: NO GROWTH

## 2015-01-31 LAB — CBG MONITORING, ED
Glucose-Capillary: 429 mg/dL — ABNORMAL HIGH (ref 70–99)
Glucose-Capillary: 266 mg/dL — ABNORMAL HIGH (ref 70–99)

## 2015-01-31 LAB — URINE MICROSCOPIC-ADD ON

## 2015-01-31 MED ORDER — SODIUM CHLORIDE 0.9 % IV SOLN
INTRAVENOUS | Status: DC
  Administered 2015-01-31: 1.8 [IU]/h via INTRAVENOUS
  Filled 2015-01-31: qty 2.5

## 2015-01-31 MED ORDER — SODIUM CHLORIDE 0.9 % IV BOLUS (SEPSIS)
1000.0000 mL | Freq: Once | INTRAVENOUS | Status: AC
  Administered 2015-01-31: 1000 mL via INTRAVENOUS

## 2015-01-31 MED ORDER — LEVOFLOXACIN IN D5W 750 MG/150ML IV SOLN
750.0000 mg | INTRAVENOUS | Status: DC
  Administered 2015-01-31: 750 mg via INTRAVENOUS
  Filled 2015-01-31 (×2): qty 150

## 2015-01-31 MED ORDER — DEXTROSE 5 % IV SOLN
2.0000 g | INTRAVENOUS | Status: AC
  Administered 2015-01-31: 2 g via INTRAVENOUS
  Filled 2015-01-31: qty 2

## 2015-01-31 MED ORDER — DOXEPIN HCL 10 MG/ML PO CONC
100.0000 mg | Freq: Every day | ORAL | Status: DC
  Filled 2015-01-31: qty 10

## 2015-01-31 MED ORDER — DEXTROSE-NACL 5-0.45 % IV SOLN
INTRAVENOUS | Status: DC
  Administered 2015-01-31 – 2015-02-01 (×2): via INTRAVENOUS

## 2015-01-31 MED ORDER — FLUTICASONE PROPIONATE 50 MCG/ACT NA SUSP
2.0000 | Freq: Every day | NASAL | Status: DC
  Administered 2015-02-02: 2 via NASAL
  Filled 2015-01-31: qty 16

## 2015-01-31 MED ORDER — SODIUM CHLORIDE 0.9 % IV SOLN: 1000.0000 mL | Freq: Once | INTRAVENOUS | Status: DC

## 2015-01-31 MED ORDER — INSULIN ASPART 100 UNIT/ML ~~LOC~~ SOLN: 4.0000 [IU] | Freq: Three times a day (TID) | SUBCUTANEOUS | Status: DC

## 2015-01-31 MED ORDER — AMPHETAMINE-DEXTROAMPHETAMINE 10 MG PO TABS
40.0000 mg | ORAL_TABLET | Freq: Two times a day (BID) | ORAL | Status: DC
  Administered 2015-01-31 – 2015-02-01 (×2): 40 mg via ORAL
  Filled 2015-01-31 (×2): qty 4

## 2015-01-31 MED ORDER — VANCOMYCIN HCL IN DEXTROSE 1-5 GM/200ML-% IV SOLN
1000.0000 mg | Freq: Once | INTRAVENOUS | Status: AC
  Administered 2015-01-31: 1000 mg via INTRAVENOUS
  Filled 2015-01-31: qty 200

## 2015-01-31 MED ORDER — ENOXAPARIN SODIUM 40 MG/0.4ML ~~LOC~~ SOLN
40.0000 mg | SUBCUTANEOUS | Status: DC
  Administered 2015-01-31 – 2015-02-01 (×2): 40 mg via SUBCUTANEOUS
  Filled 2015-01-31 (×3): qty 0.4

## 2015-01-31 MED ORDER — ACETAMINOPHEN-CODEINE #3 300-30 MG PO TABS
1.0000 | ORAL_TABLET | ORAL | Status: DC | PRN
  Administered 2015-01-31: 1 via ORAL
  Filled 2015-01-31: qty 1

## 2015-01-31 MED ORDER — DOXEPIN HCL 100 MG PO CAPS
100.0000 mg | ORAL_CAPSULE | Freq: Every day | ORAL | Status: DC
  Administered 2015-01-31 – 2015-02-01 (×2): 100 mg via ORAL
  Filled 2015-01-31 (×3): qty 1

## 2015-01-31 MED ORDER — BENZONATATE 100 MG PO CAPS: 200.0000 mg | ORAL_CAPSULE | Freq: Three times a day (TID) | ORAL | Status: DC | PRN

## 2015-01-31 MED ORDER — CEFEPIME HCL 1 G IJ SOLR
1.0000 g | Freq: Three times a day (TID) | INTRAMUSCULAR | Status: DC
  Filled 2015-01-31: qty 1

## 2015-01-31 MED ORDER — INSULIN ASPART 100 UNIT/ML ~~LOC~~ SOLN
10.0000 [IU] | Freq: Once | SUBCUTANEOUS | Status: AC
  Administered 2015-01-31: 10 [IU] via SUBCUTANEOUS
  Filled 2015-01-31 (×2): qty 1

## 2015-01-31 MED ORDER — INSULIN DETEMIR 100 UNIT/ML ~~LOC~~ SOLN
20.0000 [IU] | Freq: Every day | SUBCUTANEOUS | Status: DC
  Administered 2015-02-01 (×2): 20 [IU] via SUBCUTANEOUS
  Filled 2015-01-31 (×2): qty 0.2

## 2015-01-31 MED ORDER — SODIUM CHLORIDE 0.9 % IV SOLN: 1000.0000 mL | INTRAVENOUS | Status: DC

## 2015-01-31 NOTE — ED Notes (Signed)
Report given to Diana.

## 2015-01-31 NOTE — ED Notes (Signed)
Pt aware of need for urine specimen and given urinal.

## 2015-01-31 NOTE — ED Notes (Signed)
Bed: WA01 Expected date: 01/31/15 Expected time: 12:08 PM Means of arrival: Ambulance Comments: Congestion recent Pneumonia

## 2015-01-31 NOTE — ED Notes (Signed)
Pt refused cbg. Pt started being verbally abusive to staff. I explained to pt verbal abuse would not be tolerated. He then told me " Get the fuck out of my room then and send me someone I can speak to that way."

## 2015-01-31 NOTE — ED Notes (Signed)
Pt seen at [facility] ED last week, diagnosed with pneumonia, but left AMA. Cough continues, pt has not been taking any meds for cough except for one dose of PCN he had at home.

## 2015-01-31 NOTE — ED Provider Notes (Signed)
CSN: [id number]     Arrival date & time 01/31/15  1224 History   First MD Initiated Contact with Patient 01/31/15 1253     Chief Complaint  Patient presents with  . Cough  . Pneumonia     (Consider location/radiation/quality/duration/timing/severity/associated sxs/prior Treatment) HPI   29 year old male with history of type 1 diabetes, noncompliant with his medication, schizophrenia, who is currently homeless and living at the [facility] who presents for evaluation of cough. Patient was seen on February 6 for evaluation of cough and was subsequently diagnosed with having HCAP, along with hyperglycemia. He was admitted to the hospital and stay for 1 day but left AMA before he was psychiatrically cleared. Prior note indicating that pt stop staking his insulin because it doesn't make him feel good, and he was having bizarre ideation where he thinks that Powerful force are controlling his thoughts. He was placed on IV vancomycin and ceftazidime as well as IV glucose stabilizer.  During his recent hospitalization, pt became extremely agitated and combative to staff.  Pt decline to explain his thoughts.  He however report since leaving the hospital he has had a persistent cough, cough is worsening when he is around other people. Cough is productive with yellow sputum. He denies fever but does endorse chills, generalized fatigue, and shortness of breath with coughing. Denies any nausea vomiting diarrhea abdominal pain or back pain.     Past Medical History  Diagnosis Date  . Diabetes mellitus   . Depression   . Schizo affective schizophrenia   . Polysubstance abuse   . Scoliosis   . Chronic pain   . Noncompliance with medication regimen   . DKA (diabetic ketoacidoses) 11/18/2014  . GERD (gastroesophageal reflux disease)   . Arthritis     "both hips" (12/21/2014)   History reviewed. No pertinent past surgical history. Family History  Problem Relation Age of Onset  . Diabetes Mother     History  Substance Use Topics  . Smoking status: Current Some Day Smoker -- 1.00 packs/day for 7 years    Types: Cigarettes  . Smokeless tobacco: Never Used  . Alcohol Use: Yes     Comment: 12/21/2014 "I drink on a rare occasion"    Review of Systems  All other systems reviewed and are negative.     Allergies  Sulfa antibiotics  Home Medications   Prior to Admission medications   Medication Sig Start Date End Date Taking? Authorizing Provider  acetaminophen-codeine (TYLENOL #3) 300-30 MG per tablet Take 1 tablet by mouth every 4 (four) hours as needed. Patient taking differently: Take 1 tablet by mouth every 4 (four) hours as needed for moderate pain.  12/24/14  Yes Tresa Garter, MD  amphetamine-dextroamphetamine (ADDERALL) 20 MG tablet Take 2 tablets (40 mg total) by mouth 2 (two) times daily. 12/24/14  Yes Tresa Garter, MD  doxepin (SINEQUAN) 10 MG/ML solution Take 10 mLs (100 mg total) by mouth at bedtime. 12/22/14  Yes Ripudeep Krystal Eaton, MD  fluticasone (FLONASE) 50 MCG/ACT nasal spray Place 2 sprays into both nostrils daily. 12/30/14  Yes Harden Mo, MD  glucose blood (FREESTYLE LITE) test strip Use as instructed Patient taking differently: 1 each by Other route 3 (three) times daily. Use as instructed 12/24/14  Yes Olugbemiga E Doreene Burke, MD  glucose monitoring kit (FREESTYLE) monitoring kit 1 each by Does not apply route as needed for other. Test blood sugars twice daily and when needed 01/12/15  Yes Tresa Garter, MD  insulin aspart (NOVOLOG) 100 UNIT/ML injection Inject 10 Units into the skin 3 (three) times daily before meals. Per sliding scale Patient taking differently: Inject 3-10 Units into the skin 3 (three) times daily before meals. Per sliding scale 12/22/14  Yes Ripudeep K Rai, MD  insulin detemir (LEVEMIR) 100 UNIT/ML injection Inject 0.35 mLs (35 Units total) into the skin daily. 12/22/14  Yes Ripudeep Krystal Eaton, MD  INSULIN SYRINGE .5CC/29G 29G X 1/2" 0.5 ML  MISC 1 vial by Does not apply route 2 (two) times daily. Patient taking differently: 1 vial by Does not apply route 3 (three) times daily.  12/22/14  Yes Lacy Duverney, PA-C  Insulin Syringe-Needle U-100 30G X 5/16" 0.5 ML MISC Test blood glucose 3 times daily 12/24/14  Yes Olugbemiga E Doreene Burke, MD  Lancets (FREESTYLE) lancets Use as instructed Patient taking differently: 1 each by Other route 3 (three) times daily. Use as instructed 12/24/14  Yes Tresa Garter, MD  amoxicillin (AMOXIL) 500 MG capsule Take 2 capsules (1,000 mg total) by mouth 3 (three) times daily. Patient not taking: Reported on 01/31/2015 12/30/14   Harden Mo, MD  benzonatate (TESSALON) 200 MG capsule Take 1 capsule (200 mg total) by mouth 3 (three) times daily as needed for cough. Patient not taking: Reported on 01/31/2015 12/30/14   Harden Mo, MD  varenicline (CHANTIX STARTING MONTH PAK) 0.5 MG X 11 & 1 MG X 42 tablet Take one 0.5 mg tablet by mouth once daily for 3 days, then increase to one 0.5 mg tablet twice daily for 4 days, then increase to one 1 mg tablet twice daily. Patient not taking: Reported on 01/23/2015 12/24/14   Tresa Garter, MD   BP 104/71 mmHg  Pulse 115  Temp(Src) 97.7 F (36.5 C) (Oral)  SpO2 100% Physical Exam  Constitutional: He is oriented to person, place, and time. He appears well-developed and well-nourished. No distress.  Patient appeared unkempt however nontoxic in appearance able to speak in complete sentences.  HENT:  Head: Atraumatic.  Throat: Uvula is midline, mild posterior oropharyngeal erythema without tonsillar enlargement or exudates.  Eyes: Conjunctivae are normal.  Neck: Normal range of motion. Neck supple. No JVD present.  Cardiovascular:  Mild tachycardia without murmurs rubs or gallops  Pulmonary/Chest: Effort normal and breath sounds normal. No respiratory distress. He has no wheezes. He has no rales.  Abdominal: Soft. There is no tenderness.  Musculoskeletal: He  exhibits no edema.  Neurological: He is alert and oriented to person, place, and time.  Skin: No rash noted.  Psychiatric: He has a normal mood and affect.  Nursing note and vitals reviewed.   ED Course  Procedures (including critical care time)  2:40 PM Patient was admitted to the hospital 8 days ago diagnosis of HCAP And hyperglycemia however he left a day after admission but returned today due to worsening cough and generalized body aches. He is not tachycardic however afebrile with stable blood pressure and no hypoxia. Chest x-ray shows no evidence of pneumonia at this time. No leukocytosis. His CBG is 504 and anion gap is 15, has ketone in urine.   IV fluid given along with insulin subcutaneous.    3:17 PM Pt report he would prefer to be admitted because he feels fatigue and does not have a good home living quarter with lack of heat.  He does have insulin available and agrees to use it as prescribed.    3:37 PM I have consulted with [facility]  Dr. Cruzita Lederer who agrees to admit pt to med surg  Under his care.  He request pt to be started on glucostabilizer as treatment for DKA.  Pt made aware of plan and agrees with plan.    CRITICAL CARE Performed by: Domenic Moras Total critical care time: 30 min Critical care time was exclusive of separately billable procedures and treating other patients. Critical care was necessary to treat or prevent imminent or life-threatening deterioration. Critical care was time spent personally by me on the following activities: development of treatment plan with patient and/or surrogate as well as nursing, discussions with consultants, evaluation of patient's response to treatment, examination of patient, obtaining history from patient or surrogate, ordering and performing treatments and interventions, ordering and review of laboratory studies, ordering and review of radiographic studies, pulse oximetry and re-evaluation of patient's condition.   Labs  Review Labs Reviewed  URINALYSIS, ROUTINE W REFLEX MICROSCOPIC - Abnormal; Notable for the following:    Specific Gravity, Urine 1.044 (*)    Glucose, UA >1000 (*)    Ketones, ur 40 (*)    All other components within normal limits  I-STAT CHEM 8, ED - Abnormal; Notable for the following:    Chloride 92 (*)    Glucose, Bld 504 (*)    All other components within normal limits  CBG MONITORING, ED - Abnormal; Notable for the following:    Glucose-Capillary 429 (*)    All other components within normal limits  CBC WITH DIFFERENTIAL/PLATELET  URINE MICROSCOPIC-ADD ON    Imaging Review Dg Chest 2 View  01/31/2015   CLINICAL DATA:  Cough.  Coldness and numbness of the hands.  EXAM: CHEST  2 VIEW  COMPARISON:  Chest radiograph 01/23/2015  FINDINGS: Normal cardiac and mediastinal contours. Minimal scarring and or atelectasis left lung base. Lungs are hyperinflated. No pleural effusion or pneumothorax. Regional skeleton is unremarkable.  IMPRESSION: Minimal scarring and or atelectasis left lung base.  Lungs are hyperinflated.   Electronically Signed   By: Lovey Newcomer M.D.   On: 01/31/2015 14:06     EKG Interpretation None      MDM   Final diagnoses:  HCAP (healthcare-associated pneumonia)  Diabetic ketoacidosis without coma associated with type 1 diabetes mellitus    BP 145/101 mmHg  Pulse 94  Temp(Src) 97.7 F (36.5 C) (Oral)  Resp 18  SpO2 100%  I have reviewed nursing notes and vital signs. I personally reviewed the imaging tests through PACS system  I reviewed available ER/hospitalization records thought the EMR     Domenic Moras, PA-C 01/31/15 [place]. Alvino Chapel, MD 01/31/15 1623

## 2015-01-31 NOTE — H&P (Signed)
History and Physical   Hartford Maulden LAG:[id number] DOB: 1986-09-18 DOA: 01/31/2015  Referring physician: EDP PCP: Angelica Chessman, MD  Specialists: psychiatry   Chief Complaint: cough/chest congestion  HPI: Jin Capote is a 29 y.o. male has a past medical history significant for type 1 diabetes, noncompliance, recurrent DKA's, schizophrenia, presents to the emergency room with a chief complaint of a cough and chest congestion. He was hospitalized about a week ago for healthcare associated pneumonia, however he left AMA after one day. He was about to get involuntarily committed but eloped prior to having the paperwork done. States that he has been doing well, he apparently now has his own place, however he doesn't have heating and uses his oven and wall heaters to keep some warm. In the emergency room he exhibits paranoid ideation, he tells me that a lot of people in the world control him and everybody else's thoughts are causing his house to be very cold and his body to be sick, he attributes his cough and chest congestion as caused by other people's thoughts about him. Patient reports that he hasn't been taking his medications including his insulin for the last couple of days. He was seen by psychiatry in the past, however he does not trust them and is not following up regularly with any psychiatrist. He denies any depression or suicidal or homicidal ideation. He has been hospitalized multiple times due to medication noncompliance. He currently denies any abdominal pain or any nausea or vomiting, he denies any chest pain or breathing difficulties. He endorses a cough which has been persistent over the last week, with sputum production, as well as a tooth infection. He denies any fever or chills. In the ED, he was found to be in mild DKA, started on a glucose stabilizer and TRH was asked for admission. Chest x-ray was negative for pneumonia.   Review of Systems:  as per history of present  illness, otherwise negative   Past Medical History  Diagnosis Date  . Diabetes mellitus   . Depression   . Schizo affective schizophrenia   . Polysubstance abuse   . Scoliosis   . Chronic pain   . Noncompliance with medication regimen   . DKA (diabetic ketoacidoses) 11/18/2014  . GERD (gastroesophageal reflux disease)   . Arthritis     "both hips" (12/21/2014)   History reviewed. No pertinent past surgical history. Social History:  reports that he has been smoking Cigarettes.  He has a 7 pack-year smoking history. He has never used smokeless tobacco. He reports that he drinks alcohol. He reports that he uses illicit drugs (Marijuana).  Allergies  Allergen Reactions  . Sulfa Antibiotics Other (See Comments)    Unknown - Childhood    Family History  Problem Relation Age of Onset  . Diabetes Mother     Prior to Admission medications   Medication Sig Start Date End Date Taking? Authorizing Provider  acetaminophen-codeine (TYLENOL #3) 300-30 MG per tablet Take 1 tablet by mouth every 4 (four) hours as needed. Patient taking differently: Take 1 tablet by mouth every 4 (four) hours as needed for moderate pain.  12/24/14  Yes Tresa Garter, MD  amphetamine-dextroamphetamine (ADDERALL) 20 MG tablet Take 2 tablets (40 mg total) by mouth 2 (two) times daily. 12/24/14  Yes Tresa Garter, MD  doxepin (SINEQUAN) 10 MG/ML solution Take 10 mLs (100 mg total) by mouth at bedtime. 12/22/14  Yes Ripudeep Krystal Eaton, MD  fluticasone (FLONASE) 50 MCG/ACT nasal spray Place  2 sprays into both nostrils daily. 12/30/14  Yes Harden Mo, MD  glucose blood (FREESTYLE LITE) test strip Use as instructed Patient taking differently: 1 each by Other route 3 (three) times daily. Use as instructed 12/24/14  Yes Olugbemiga E Doreene Burke, MD  glucose monitoring kit (FREESTYLE) monitoring kit 1 each by Does not apply route as needed for other. Test blood sugars twice daily and when needed 01/12/15  Yes Olugbemiga E  Jegede, MD  insulin aspart (NOVOLOG) 100 UNIT/ML injection Inject 10 Units into the skin 3 (three) times daily before meals. Per sliding scale Patient taking differently: Inject 3-10 Units into the skin 3 (three) times daily before meals. Per sliding scale 12/22/14  Yes Ripudeep K Rai, MD  insulin detemir (LEVEMIR) 100 UNIT/ML injection Inject 0.35 mLs (35 Units total) into the skin daily. 12/22/14  Yes Ripudeep Krystal Eaton, MD  INSULIN SYRINGE .5CC/29G 29G X 1/2" 0.5 ML MISC 1 vial by Does not apply route 2 (two) times daily. Patient taking differently: 1 vial by Does not apply route 3 (three) times daily.  12/22/14  Yes Lacy Duverney, PA-C  Insulin Syringe-Needle U-100 30G X 5/16" 0.5 ML MISC Test blood glucose 3 times daily 12/24/14  Yes Olugbemiga E Doreene Burke, MD  Lancets (FREESTYLE) lancets Use as instructed Patient taking differently: 1 each by Other route 3 (three) times daily. Use as instructed 12/24/14  Yes Tresa Garter, MD  amoxicillin (AMOXIL) 500 MG capsule Take 2 capsules (1,000 mg total) by mouth 3 (three) times daily. Patient not taking: Reported on 01/31/2015 12/30/14   Harden Mo, MD  benzonatate (TESSALON) 200 MG capsule Take 1 capsule (200 mg total) by mouth 3 (three) times daily as needed for cough. Patient not taking: Reported on 01/31/2015 12/30/14   Harden Mo, MD  varenicline (CHANTIX STARTING MONTH PAK) 0.5 MG X 11 & 1 MG X 42 tablet Take one 0.5 mg tablet by mouth once daily for 3 days, then increase to one 0.5 mg tablet twice daily for 4 days, then increase to one 1 mg tablet twice daily. Patient not taking: Reported on 01/23/2015 12/24/14   Tresa Garter, MD   Physical Exam: Filed Vitals:   01/31/15 1219 01/31/15 1457  BP: 104/71 145/101  Pulse: 115 94  Temp: 97.7 F (36.5 C)   TempSrc: Oral   Resp:  18  SpO2: 100% 100%     General:  No apparent distress, somewhat agitated   Eyes: no scleral icterus  ENT: moist oropharynx, poor dentition, small swelling/abscess  evident on the right mandible gum line  Neck: supple, no lymphadenopathy  Cardiovascular: regular rate without MRG; 2+ peripheral pulses, no JVD, no peripheral edema  Respiratory: CTA biL, good air movement without wheezing, rhonchi or crackled  Abdomen: soft, non tender to palpation, positive bowel sounds, no guarding, no rebound  Skin: no rashes  Musculoskeletal: normal bulk and tone, no joint swelling  Psychiatric: pressured speech, flight of ideas   Neurologic: nonfocal   Labs on Admission:  Basic Metabolic Panel:  Recent Labs Lab 01/31/15 1426  NA 135  K 3.9  CL 92*  GLUCOSE 504*  BUN 9  CREATININE 0.60   CBC:  Recent Labs Lab 01/31/15 1306 01/31/15 1426  WBC 9.2  --   NEUTROABS 7.1  --   HGB 13.6 14.3  HCT 39.2 42.0  MCV 85.8  --   PLT 375  --    CBG:  Recent Labs Lab 01/31/15 [place]  429*    Radiological Exams on Admission: Dg Chest 2 View  01/31/2015   CLINICAL DATA:  Cough.  Coldness and numbness of the hands.  EXAM: CHEST  2 VIEW  COMPARISON:  Chest radiograph 01/23/2015  FINDINGS: Normal cardiac and mediastinal contours. Minimal scarring and or atelectasis left lung base. Lungs are hyperinflated. No pleural effusion or pneumothorax. Regional skeleton is unremarkable.  IMPRESSION: Minimal scarring and or atelectasis left lung base.  Lungs are hyperinflated.   Electronically Signed   By: Lovey Newcomer M.D.   On: 01/31/2015 14:06   EKG: Independently reviewed.  Assessment/Plan Active Problems:   Schizophrenia, undifferentiated   Generalized anxiety disorder   DKA, type 1   Hyperglycemia   HCAP (healthcare-associated pneumonia)   Mild DKA - patient was started on a medical stabilizer in the emergency room, will continue. He has a mild gap of 15, has ketones in the urine, however he also did not eat well for the past few days. He is really hungry in the emergency room, will allow diet with mealtime coverage, I expect the patient that if his  sugars are not coming down with mealtime coverage and insulin drip he will need to be nothing by mouth until his sugars get better. - We'll check a BMP in 4 hours, if gap closed and can probably have his long-acting insulin and come off of the glucose stabilizer  Type 1 diabetes mellitus - patient is quite noncompliant with his medications, states that he was so cold and unable to eat that he didn't take his medications this time around  Schizophrenia - patient is noncompliant with medications for this as well. I feel like this is impacting his diabetes as well as his overall living conditions as he feels like other people's thoughts are causing his body to be sick. I have consulted psychiatry to come see hospitalized. Last time he was unable to be evaluated since he left AMA. He was seen by psychiatry at one point last December. Appreciate input. I don't see reasons this time around to involuntarily commit him as he agrees with admission, denies any suicidal/homicidal ideation or has active hallucinations.    ?HCAP - Chest x-ray on 2/6 at his previous ED visit showed lingular pneumonia with question of early informant of the left lower lobe as well. He was briefly on IV antibiotics for 1 day until he left AMA  And on no antibiotics since. Chest x-ray now shows no evidence of pneumonia. He has respiratory symptoms most likely consistent with bronchitis, already received cefepime in the emergency room, will discontinue that and start levofloxacin. He is afebrile and has no leukocytosis.   Dental caries - he will definitely need some resources for dentistry in the area, however I'm not sure his compliance to that.   Diet: carb modified Fluids: NS DVT Prophylaxis: Lovenox  Code Status: Full  Family Communication: d/w patient  Disposition Plan: inpatient  Time spent: 35  Keanon Bevins M. Cruzita Lederer, MD [facility] Pager [phone]  If 7PM-7AM, please contact night-coverage [email or web address] Password  [facility] 01/31/2015, 4:16 PM

## 2015-01-31 NOTE — ED Notes (Signed)
Glucose:266

## 2015-01-31 NOTE — Progress Notes (Signed)
ANTIBIOTIC CONSULT NOTE - INITIAL  Pharmacy Consult for Cefepime Indication: HCAP  Allergies  Allergen Reactions  . Sulfa Antibiotics Other (See Comments)    Unknown - Childhood    Patient Measurements:   Height: 6'1" Weight: 65 kg  Vital Signs: Temp: 97.7 F (36.5 C) (02/14 1219) Temp Source: Oral (02/14 1219) BP: 104/71 mmHg (02/14 1219) Pulse Rate: 115 (02/14 1219) Intake/Output from previous day:   Intake/Output from this shift:    Labs: No results for input(s): WBC, HGB, PLT, LABCREA, CREATININE in the last 72 hours. Estimated Creatinine Clearance: 124.5 mL/min (by C-G formula based on Cr of 0.Christopher). No results for input(s): VANCOTROUGH, VANCOPEAK, VANCORANDOM, GENTTROUGH, GENTPEAK, GENTRANDOM, TOBRATROUGH, TOBRAPEAK, TOBRARND, AMIKACINPEAK, AMIKACINTROU, AMIKACIN in the last 72 hours.   Microbiology: Recent Results (from the past 720 hour(s))  Culture, blood (routine x 2) Call MD if unable to obtain prior to antibiotics being given     Status: None (Preliminary result)   Collection Time: 01/23/15  3:55 PM  Result Value Ref Range Status   Specimen Description BLOOD LEFT ARM  Final   Special Requests BOTTLES DRAWN AEROBIC ONLY 5CC  Final   Culture   Final           BLOOD CULTURE RECEIVED NO GROWTH TO DATE CULTURE WILL BE HELD FOR 5 DAYS BEFORE ISSUING A FINAL NEGATIVE REPORT Performed at [facility]    Report Status PENDING  Incomplete  Culture, blood (routine x 2) Call MD if unable to obtain prior to antibiotics being given     Status: None (Preliminary result)   Collection Time: 01/23/15  4:01 PM  Result Value Ref Range Status   Specimen Description BLOOD LEFT HAND  Final   Special Requests BOTTLES DRAWN AEROBIC AND ANAEROBIC 5CC  Final   Culture   Final           BLOOD CULTURE RECEIVED NO GROWTH TO DATE CULTURE WILL BE HELD FOR 5 DAYS BEFORE ISSUING A FINAL NEGATIVE REPORT Performed at [facility]    Report Status PENDING  Incomplete  MRSA  PCR Screening     Status: None   Collection Time: 01/23/15  6:21 PM  Result Value Ref Range Status   MRSA by PCR NEGATIVE NEGATIVE Final    Comment:        The GeneXpert MRSA Assay (FDA approved for NASAL specimens only), is one component of a comprehensive MRSA colonization surveillance program. It is not intended to diagnose MRSA infection nor to guide or monitor treatment for MRSA infections.   Culture, sputum-assessment     Status: None   Collection Time: 01/23/15  6:48 PM  Result Value Ref Range Status   Specimen Description SPUTUM  Final   Special Requests NONE  Final   Sputum evaluation   Final    THIS SPECIMEN IS ACCEPTABLE. RESPIRATORY CULTURE REPORT TO FOLLOW.   Report Status 01/23/2015 FINAL  Final  Culture, respiratory (NON-Expectorated)     Status: None   Collection Time: 01/23/15  6:48 PM  Result Value Ref Range Status   Specimen Description SPUTUM  Final   Special Requests NONE  Final   Gram Stain   Final    FEW WBC PRESENT,BOTH PMN AND MONONUCLEAR NO SQUAMOUS EPITHELIAL CELLS SEEN NO ORGANISMS SEEN Performed at [facility]    Culture   Final    MODERATE HAEMOPHILUS INFLUENZAE Note: BETA LACTAMASE NEGATIVE Performed at [facility]    Report Status 01/26/2015 FINAL  Final  Medical History: Past Medical History  Diagnosis Date  . Diabetes mellitus   . Depression   . Schizo affective schizophrenia   . Polysubstance abuse   . Scoliosis   . Chronic pain   . Noncompliance with medication regimen   . DKA (diabetic ketoacidoses) 11/18/2014  . GERD (gastroesophageal reflux disease)   . Arthritis     "both hips" (12/21/2014)    Medications:  Scheduled:   Infusions:  . ceFEPime (MAXIPIME) IV    . sodium chloride    . vancomycin     PRN:   Assessment: 29 yo [name] with type 1 diabetes and schizophrenia who presents for evaluation of cough on 2/14. Patient was seen on February 6 for evaluation of cough and was subsequently  diagnosed with having HCAP and treated with vancomycin and cefepime for approximately one day then left AMA. Cultures were negative. Pharmacy is consulted to dose cefepime for the treatment of HCAP.  Goal of Therapy:  Eradication of infection Dose appropriate for indication and renal function  Plan:   Cefepime 2g IV x 1, then 1g q8h Follow up renal function & cultures  Christopher [name], [name], BCPS Pager: [phone] 01/31/2015,2:00 PM

## 2015-01-31 NOTE — ED Notes (Signed)
CBG 429. 

## 2015-02-01 DIAGNOSIS — J201 Acute bronchitis due to Hemophilus influenzae: Secondary | ICD-10-CM

## 2015-02-01 DIAGNOSIS — J189 Pneumonia, unspecified organism: Secondary | ICD-10-CM | POA: Diagnosis not present

## 2015-02-01 DIAGNOSIS — E876 Hypokalemia: Secondary | ICD-10-CM

## 2015-02-01 DIAGNOSIS — F329 Major depressive disorder, single episode, unspecified: Secondary | ICD-10-CM | POA: Diagnosis not present

## 2015-02-01 DIAGNOSIS — E1310 Other specified diabetes mellitus with ketoacidosis without coma: Secondary | ICD-10-CM | POA: Diagnosis not present

## 2015-02-01 DIAGNOSIS — F250 Schizoaffective disorder, bipolar type: Secondary | ICD-10-CM | POA: Diagnosis not present

## 2015-02-01 DIAGNOSIS — F203 Undifferentiated schizophrenia: Secondary | ICD-10-CM

## 2015-02-01 DIAGNOSIS — E1010 Type 1 diabetes mellitus with ketoacidosis without coma: Secondary | ICD-10-CM

## 2015-02-01 LAB — GLUCOSE, CAPILLARY
GLUCOSE-CAPILLARY: 106 mg/dL — AB (ref 70–99)
GLUCOSE-CAPILLARY: 137 mg/dL — AB (ref 70–99)
GLUCOSE-CAPILLARY: 143 mg/dL — AB (ref 70–99)
GLUCOSE-CAPILLARY: 349 mg/dL — AB (ref 70–99)
Glucose-Capillary: 143 mg/dL — ABNORMAL HIGH (ref 70–99)
Glucose-Capillary: 223 mg/dL — ABNORMAL HIGH (ref 70–99)
Glucose-Capillary: 313 mg/dL — ABNORMAL HIGH (ref 70–99)

## 2015-02-01 LAB — CBC
HEMATOCRIT: 32.7 % — AB (ref 39.0–52.0)
HEMOGLOBIN: 11 g/dL — AB (ref 13.0–17.0)
MCH: 28.9 pg (ref 26.0–34.0)
MCHC: 33.6 g/dL (ref 30.0–36.0)
MCV: 85.8 fL (ref 78.0–100.0)
Platelets: 315 10*3/uL (ref 150–400)
RBC: 3.81 MIL/uL — ABNORMAL LOW (ref 4.22–5.81)
RDW: 12.7 % (ref 11.5–15.5)
WBC: 7.1 10*3/uL (ref 4.0–10.5)

## 2015-02-01 LAB — COMPREHENSIVE METABOLIC PANEL
ALK PHOS: 74 U/L (ref 39–117)
ALT: 11 U/L (ref 0–53)
ANION GAP: 6 (ref 5–15)
AST: 11 U/L (ref 0–37)
Albumin: 2.8 g/dL — ABNORMAL LOW (ref 3.5–5.2)
BUN: 13 mg/dL (ref 6–23)
CO2: 32 mmol/L (ref 19–32)
Calcium: 8.7 mg/dL (ref 8.4–10.5)
Chloride: 100 mmol/L (ref 96–112)
Creatinine, Ser: 0.53 mg/dL (ref 0.50–1.35)
GFR calc Af Amer: >90 mL/min (ref >90)
Glucose, Bld: 158 mg/dL — ABNORMAL HIGH (ref 70–99)
Potassium: 3.1 mmol/L — ABNORMAL LOW (ref 3.5–5.1)
SODIUM: 138 mmol/L (ref 135–145)
TOTAL PROTEIN: 5.5 g/dL — AB (ref 6.0–8.3)
Total Bilirubin: 0.5 mg/dL (ref 0.3–1.2)

## 2015-02-01 LAB — BASIC METABOLIC PANEL
Anion gap: 5 (ref 5–15)
BUN: 14 mg/dL (ref 6–23)
CALCIUM: 8.7 mg/dL (ref 8.4–10.5)
CO2: 31 mmol/L (ref 19–32)
CREATININE: 0.6 mg/dL (ref 0.50–1.35)
Chloride: 100 mmol/L (ref 96–112)
GFR calc Af Amer: >90 mL/min (ref >90)
Glucose, Bld: 158 mg/dL — ABNORMAL HIGH (ref 70–99)
POTASSIUM: 3.5 mmol/L (ref 3.5–5.1)
Sodium: 136 mmol/L (ref 135–145)

## 2015-02-01 MED ORDER — INSULIN ASPART 100 UNIT/ML ~~LOC~~ SOLN
0.0000 [IU] | Freq: Three times a day (TID) | SUBCUTANEOUS | Status: DC
  Administered 2015-02-01: 3 [IU] via SUBCUTANEOUS

## 2015-02-01 MED ORDER — BENZTROPINE MESYLATE 1 MG/ML IJ SOLN
1.0000 mg | Freq: Two times a day (BID) | INTRAMUSCULAR | Status: DC | PRN
  Filled 2015-02-01: qty 1

## 2015-02-01 MED ORDER — INSULIN ASPART 100 UNIT/ML ~~LOC~~ SOLN
0.0000 [IU] | Freq: Every day | SUBCUTANEOUS | Status: DC
  Administered 2015-02-01: 4 [IU] via SUBCUTANEOUS

## 2015-02-01 MED ORDER — QUETIAPINE FUMARATE 100 MG PO TABS
100.0000 mg | ORAL_TABLET | Freq: Two times a day (BID) | ORAL | Status: DC
  Administered 2015-02-01 – 2015-02-02 (×3): 100 mg via ORAL
  Filled 2015-02-01 (×5): qty 1

## 2015-02-01 MED ORDER — BENZTROPINE MESYLATE 1 MG PO TABS
1.0000 mg | ORAL_TABLET | Freq: Two times a day (BID) | ORAL | Status: DC | PRN
  Filled 2015-02-01: qty 1

## 2015-02-01 MED ORDER — POTASSIUM CHLORIDE CRYS ER 20 MEQ PO TBCR
40.0000 meq | EXTENDED_RELEASE_TABLET | Freq: Once | ORAL | Status: AC
  Administered 2015-02-01: 40 meq via ORAL
  Filled 2015-02-01: qty 2

## 2015-02-01 MED ORDER — HALOPERIDOL LACTATE 5 MG/ML IJ SOLN: 5.0000 mg | Freq: Four times a day (QID) | INTRAMUSCULAR | Status: DC | PRN

## 2015-02-01 MED ORDER — LEVOFLOXACIN 500 MG PO TABS
500.0000 mg | ORAL_TABLET | Freq: Every day | ORAL | Status: DC
  Filled 2015-02-01: qty 1

## 2015-02-01 MED ORDER — LORAZEPAM 2 MG/ML IJ SOLN
0.5000 mg | Freq: Four times a day (QID) | INTRAMUSCULAR | Status: DC
  Administered 2015-02-01 – 2015-02-02 (×3): 0.5 mg via INTRAMUSCULAR
  Filled 2015-02-01 (×3): qty 1

## 2015-02-01 MED ORDER — AMOXICILLIN 500 MG PO CAPS
500.0000 mg | ORAL_CAPSULE | Freq: Three times a day (TID) | ORAL | Status: DC
  Administered 2015-02-01 – 2015-02-02 (×3): 500 mg via ORAL
  Filled 2015-02-01 (×6): qty 1

## 2015-02-01 MED ORDER — INSULIN ASPART 100 UNIT/ML ~~LOC~~ SOLN
0.0000 [IU] | Freq: Three times a day (TID) | SUBCUTANEOUS | Status: DC
Start: 2015-02-01 — End: 2015-02-02
  Administered 2015-02-01: 7 [IU] via SUBCUTANEOUS
  Administered 2015-02-02: 9 [IU] via SUBCUTANEOUS
  Administered 2015-02-02: 3 [IU] via SUBCUTANEOUS

## 2015-02-01 NOTE — Progress Notes (Addendum)
PROGRESS NOTE    [name] MBW:[id number] DOB: Dec 17, 1986 DOA: 01/31/2015 PCP: Jeanann Lewandowsky, MD  HPI/Brief narrative 29 y/o Male with PMH of DM1, recurrent DKA's, Schizophrenia, polysubstance abuse, recent hospitalization for HCAP (Left AMA), medical noncompliance presented to the ED with complaints of productive cough and chest congestion. He had not been taking his insulins for the last couple of days. He does not regularly follow-up with psychiatry. He was paranoid in the ED but denied suicidal or homicidal ideations. He was found to be in mild DKA and chest x-ray was negative for pneumonia.  Assessment/Plan:  1. Poorly controlled type I DM with mild DKA: Patient was hydrated with IV fluids and placed on IV insulin. DKA resolved. He was transitioned to Levemir and NovoLog SSI. CBGs fluctuating but better. Counseled regarding compliance with medications. This is likely to be a recurrent problem secondary to his underlying psychiatric illness. A1c: 12 recently. 2. Recent HCAP/Acute bronchitis d/t Hemophilus Influnza: Chest x-ray 2/6 showed lingual or pneumonia and early infiltrate in LLL. He was treated with IV antibiotics for 1 day before he left AMA without antibiotics. Current chest x-ray shows no pneumonia. He received a dose of cefepime in the ED. Continue levofloxacin for possible acute bacterial bronchitis. Final blood cultures from 2/6: Negative. Sputum cultures from 2/6: Haemophilus influenza > Levofloxacin should cover. 3. Hypokalemia: Replace and follow BMP 4. Anemia: Unclear etiology. Follow CBC in a.m. 5. Schizophrenia/depression: Psychiatry input pending. No hallucinations, suicidal or homicidal ideations. Does have intermittent paranoia. He was verbally abusive to staff yesterday. 6. THC abuse: Cessation counseled. 7. Dental caries: Outpatient follow-up with her dentist.   Code Status: Full Family Communication: None at bedside Disposition Plan: Home when  medically stable- possibly in 1-2 days.   Consultants:  Psychiatry  Procedures:  None  Antibiotics:  IV cefepime 1 dose.  Oral levofloxacin to/14 >   Subjective: Feels better. Complains of cough and chest congestion. Denies chest pain or dyspnea. No hallucinations, suicidal or homicidal ideations.   Objective: Filed Vitals:   01/31/15 1219 01/31/15 1457 01/31/15 1702 01/31/15 2013  BP: 104/71 145/101 120/75 153/108  Pulse: 115 94 91 113  Temp: 97.7 F (36.5 C)  98 F (36.7 C) 98.1 F (36.7 C)  TempSrc: Oral  Oral Oral  Resp:  18 18 18   Height:   6\' 1"  (1.854 m)   Weight:   60.192 kg (132 lb 11.2 oz)   SpO2: 100% 100% 100% 100%    Intake/Output Summary (Last 24 hours) at 02/01/15 1204 Last data filed at 01/31/15 2200  Gross per 24 hour  Intake 691.67 ml  Output      0 ml  Net 691.67 ml   Filed Weights   01/31/15 1702  Weight: 60.192 kg (132 lb 11.2 oz)     Exam:  General exam: Pleasant young male lying comfortably in bed.  Respiratory system:Slightly harsh breath sounds posteriorly but otherwise clear to auscultation. No increased work of breathing. Cardiovascular system: S1 & S2 heard, RRR. No JVD, murmurs, gallops, clicks or pedal edema. Gastrointestinal system: Abdomen is nondistended, soft and nontender. Normal bowel sounds heard. Central nervous system: Alert and oriented. No focal neurological deficits. Extremities: Symmetric 5 x 5 power.   Data Reviewed: Basic Metabolic Panel:  Recent Labs Lab 01/31/15 1426 01/31/15 2006 02/01/15 0013 02/01/15 0413  NA 135 136 136 138  K 3.9 4.0 3.5 3.1*  CL 92* 99 100 100  CO2  --  30 31 32  GLUCOSE  504* 244* 158* 158*  BUN 9 13 14 13   CREATININE 0.60 0.73 0.60 0.53  CALCIUM  --  8.7 8.7 8.7   Liver Function Tests:  Recent Labs Lab 02/01/15 0413  AST 11  ALT 11  ALKPHOS 74  BILITOT 0.5  PROT 5.5*  ALBUMIN 2.8*   No results for input(s): LIPASE, AMYLASE in the last 168 hours. No results  for input(s): AMMONIA in the last 168 hours. CBC:  Recent Labs Lab 01/31/15 1306 01/31/15 1426 02/01/15 0413  WBC 9.2  --  7.1  NEUTROABS 7.1  --   --   HGB 13.6 14.3 11.0*  HCT 39.2 42.0 32.7*  MCV 85.8  --  85.8  PLT 375  --  315   Cardiac Enzymes: No results for input(s): CKTOTAL, CKMB, CKMBINDEX, TROPONINI in the last 168 hours. BNP (last 3 results) No results for input(s): PROBNP in the last 8760 hours. CBG:  Recent Labs Lab 02/01/15 0022 02/01/15 0119 02/01/15 0233 02/01/15 0918 02/01/15 1110  GLUCAP 143* 137* 143* 106* 223*    Recent Results (from the past 240 hour(s))  Culture, blood (routine x 2) Call MD if unable to obtain prior to antibiotics being given     Status: None   Collection Time: 01/23/15  3:55 PM  Result Value Ref Range Status   Specimen Description BLOOD LEFT ARM  Final   Special Requests BOTTLES DRAWN AEROBIC ONLY 5CC  Final   Culture   Final    NO GROWTH 5 DAYS Performed at [facility]    Report Status 01/31/2015 FINAL  Final  Culture, blood (routine x 2) Call MD if unable to obtain prior to antibiotics being given     Status: None   Collection Time: 01/23/15  4:01 PM  Result Value Ref Range Status   Specimen Description BLOOD LEFT HAND  Final   Special Requests BOTTLES DRAWN AEROBIC AND ANAEROBIC 5CC  Final   Culture   Final    NO GROWTH 5 DAYS Performed at [facility]/24[facility]    Report Status 01/31/2015 FINAL  Final  MRSA PCR Screening     Status: None   Collection Time: 01/23/15  6:21 PM  Result Value Ref Range Status   MRSA by PCR NEGATIVE NEGATIVE Final    Comment:        The GeneXpert MRSA Assay (FDA approved for NASAL specimens only), is one component of a comprehensive MRSA colonization surveillance program. It is not intended to diagnose MRSA infection nor to guide or monitor treatment for MRSA infections.   Culture, sputum-assessment     Status: None   Collection Time: 01/23/15  6:48 PM  Result Value Ref  Range Status   Specimen Description SPUTUM  Final   Special Requests NONE  Final   Sputum evaluation   Final    THIS SPECIMEN IS ACCEPTABLE. RESPIRATORY CULTURE REPORT TO FOLLOW.   Report Status 01/23/2015 FINAL  Final  Culture, respiratory (NON-Expectorated)     Status: None   Collection Time: 01/23/15  6:48 PM  Result Value Ref Range Status   Specimen Description SPUTUM  Final   Special Requests NONE  Final   Gram Stain   Final    FEW WBC PRESENT,BOTH PMN AND MONONUCLEAR NO SQUAMOUS EPITHELIAL CELLS SEEN NO ORGANISMS SEEN Performed at [facility]    Culture   Final    MODERATE HAEMOPHILUS INFLUENZAE Note: BETA LACTAMASE NEGATIVE Performed at [facility]    Report Status  01/26/2015 FINAL  Final         Studies: Dg Chest 2 View  01/31/2015   CLINICAL DATA:  Cough.  Coldness and numbness of the hands.  EXAM: CHEST  2 VIEW  COMPARISON:  Chest radiograph 01/23/2015  FINDINGS: Normal cardiac and mediastinal contours. Minimal scarring and or atelectasis left lung base. Lungs are hyperinflated. No pleural effusion or pneumothorax. Regional skeleton is unremarkable.  IMPRESSION: Minimal scarring and or atelectasis left lung base.  Lungs are hyperinflated.   Electronically Signed   By: Annia Belt M.D.   On: 01/31/2015 14:06        Scheduled Meds: . amphetamine-dextroamphetamine  40 mg Oral BID  . doxepin  100 mg Oral QHS  . enoxaparin (LOVENOX) injection  40 mg Subcutaneous Q24H  . fluticasone  2 spray Each Nare Daily  . insulin aspart  0-9 Units Subcutaneous TID WC  . insulin detemir  20 Units Subcutaneous QHS  . levofloxacin (LEVAQUIN) IV  750 mg Intravenous Q24H   Continuous Infusions: . dextrose 5 % and 0.45% NaCl 100 mL/hr at 02/01/15 0546    Active Problems:   Schizophrenia, undifferentiated   Generalized anxiety disorder   DKA, type 1   Hyperglycemia   HCAP (healthcare-associated pneumonia)    Time spent: 30 minutes   Miley Lindon,  MD, FACP, FHM. [facility] Pager [phone]  If 7PM-7AM, please contact night-coverage [email or web address] Password [email or web address] 02/01/2015, 12:04 PM    LOS: 1 day

## 2015-02-01 NOTE — Progress Notes (Signed)
Clinical Social Work Department CLINICAL SOCIAL WORK PSYCHIATRY SERVICE LINE ASSESSMENT 02/01/2015  Patient:  [name]  Account:  [id number]  [place] Date:  01/31/2015  Clinical Social Worker:  Sindy Messing, LCSW  Date/Time:  02/01/2015 02:00 PM Referred by:  Physician  Date referred:  02/01/2015 Reason for Referral  Psychosocial assessment   Presenting Symptoms/Problems (In the person's/family's own words):   Psych consulted due to schizophrenia and medication management.   Abuse/Neglect/Trauma History (check all that apply)  Denies history   Abuse/Neglect/Trauma Comments:   Psychiatric History (check all that apply)  Outpatient treatment   Psychiatric medications:  Ativan 0.5 mg  Seroquel 100 mg  Sinequan 100 mg   Current Mental Health Hospitalizations/Previous Mental Health History:   Patient denies any previous MH history of schizophrenia. Patient reports he has been prescribed Adderall but denies any ADHD. Patient reports he might have been diagnosed with depression in the past.   Current provider:   [facility] and Date:   [place], [place]   Current Medications:   Scheduled Meds:      . amoxicillin  500 mg Oral 3 times per day  . doxepin  100 mg Oral QHS  . enoxaparin (LOVENOX) injection  40 mg Subcutaneous Q24H  . fluticasone  2 spray Each Nare Daily  . insulin aspart  0-5 Units Subcutaneous QHS  . insulin aspart  0-9 Units Subcutaneous TID WC  . insulin detemir  20 Units Subcutaneous QHS  . LORazepam  0.5 mg Intramuscular Q6H  . QUEtiapine  100 mg Oral BID        Continuous Infusions:      PRN Meds:.acetaminophen-codeine, benzonatate, benztropine **OR** benztropine mesylate, haloperidol lactate       Previous Impatient Admission/Date/Reason:   Per chart review, patient was at [facility] in 2013.   Emotional Health / Current Symptoms    Suicide/Self Harm  None reported   Suicide attempt in the past:   Patient denies any SI  or HI. Patient denies any previous suicide attempts.   Other harmful behavior:   None reported   Psychotic/Dissociative Symptoms  Delusional  Paranoia   Other Psychotic/Dissociative Symptoms:   Patient denies any AH or VH. RN reports that patient has been paranoid and reporting that "people are out to get him".    Attention/Behavioral Symptoms  Verbal aggression   Other Attention / Behavioral Symptoms:   Patient reports he does not want to answer questions and is annoyed that medical team will not prescribe his medications like he wants.    Cognitive Impairment  Within Normal Limits   Other Cognitive Impairment:   Patient alert and oriented.    Mood and Adjustment  Aggressive/frustrated  Labile  Guarded    Stress, Anxiety, Trauma, Any Recent Loss/Stressor  None reported   Anxiety (frequency):   N/A   Phobia (specify):   N/A   Compulsive behavior (specify):   N/A   Obsessive behavior (specify):   N/A   Other:   N/A   Substance Abuse/Use  None   SBIRT completed (please refer for detailed history):  N  Self-reported substance use:   Patient denies any substance use. Per chart review, patient has a history of marijuana use.   Urinary Drug Screen Completed:  N Alcohol level:   N/A    Environmental/Housing/Living Arrangement  Stable housing   Who is in the home:   Alone   Emergency contact:  [place]  Patient's Strengths and Goals (patient's own words):   Patient reports he is independent and strong. Patient reports no concerns.   Clinical Social Worker's Interpretive Summary:   CSW received referral in order to complete psychosocial assessment. CSW reviewed chart and met with patient at bedside along with psych MD.    Patient laying in bed and has pressured speech. Patient guarded and refuses to answer several questions. Patient reports he does not want to be interrogated but needs a doctor to prescribe his  Adderall.    Patient reports a long history of moving to several different states. Patient talks about living in [place], [place], [place] and finally moving to [place]. Patient reports he has not been here long but that he was living at [facility] for about 1 month prior to moving into his apartment. Patient reports that he went to the [facility] and Dr Doreene Burke prescribed him with a month of Adderall but reports he will need to find another MD to prescribe Adderall on an ongoing basis. Patient reports he does not want to see a psychiatrist and will "go doctor shopping" to find someone to write him a prescription. Patient reports he had [facility] through [facility] in the past but fired them because he does not feel he needs any assistance.    Patient adamant that he wants to be left alone and hates talking with psychiatrist. Patient reports he will never follow up in the community and only wants his Adderall prescription refilled. Patient denies any other MH diagnosis and reports he does not want to talk about why he was taking psychotropic medications for schizophrenia in the past.    Patient guarded and began yelling at medical team. Patient denies any SI or HI. Patient denies any psychotic features. Patient does report that at times "I talk in the air and God deals with it" but denies any paranoia or delusions at this time.    CSW staffed case with psych MD who reports that at this time, patient does not meet criteria for IVC. Psych MD reports if behavior changes or patient begins to voice SI or HI, then IVC can be reconsidered. CSW attempted to talk with patient about outpatient resources but patient reports he is angry and asked CSW to come back tomorrow. CSW will continue to follow.   Disposition:  Recommend Psych CSW continuing to support while in hospital   [place], [place] [phone]

## 2015-02-01 NOTE — Progress Notes (Signed)
UR completed 

## 2015-02-01 NOTE — Consult Note (Signed)
[facility] Face-to-Face Psychiatry Consult   Reason for Consult:  Schizophrenia and non compliance with psych medications Referring Physician:  Dr. Pamella Pert  Patient Identification: [name] MRN:  [id number] Principal Diagnosis: <principal problem not specified> Diagnosis:   Patient Active Problem List   Diagnosis Date Noted  . HCAP (healthcare-associated pneumonia) [J18.9] 01/23/2015  . Hyperglycemia [R73.9] 12/21/2014  . Diabetic ketoacidosis without coma associated with diabetes mellitus due to underlying condition [E08.10]   . Involuntary commitment [Z04.6] 11/19/2014  . DKA (diabetic ketoacidoses) [E13.10] 11/18/2014  . Hip pain, bilateral [M25.551, M25.552] 11/18/2014  . DKA, type 1 [E10.10] 06/17/2012  . Hyponatremia [E87.1] 06/17/2012  . Dehydration [E86.0] 06/17/2012  . Generalized anxiety disorder [F41.1] 02/19/2012  . Diabetes type 1, uncontrolled [E10.65] 02/19/2012  . Schizophrenia, undifferentiated [F20.3] 02/18/2012    Class: Acute    Total Time spent with patient: 45 minutes  Subjective:   [name] is a 29 y.o. male patient admitted with psychosis and chest congestion.  HPI:  [name] is a 29 y.o. male seen and chart reviewed. Patient has stated that he has mood disorder and adderall and doxepin works for him. He also smokes THC. He has paranoid delusions about some body doing some thing to him but states that he does not want to talk about it. He feels god is there to take care of the people who trying to do some thing to him. He states that he came to the hospital because he was sick and coughing.Reportedly he has been non compliant with his insulin. He was in [place] about a month and about two weeks ago relocated to his own apartment. [facility] but reportedly told him he needs to find a another physician. Patient reportedly trying to find another physician who can prescribe his adderall  and doxepin. Patient repeatedly stated that he has no suicide or homicide ideation, intention or plans and does not want to see psychiatrist to take psychiatric medication. He was previously treated with Prolixin and seroquel and trazodone. Reportedly he has no psych follow up after discharge. Patient does not have family, reportedly father died and mother relocated to [place]. He was briefly lived in [place] and [place].   Medical history: Patient has a past medical history significant for type 1 diabetes, noncompliance, recurrent DKA's, schizophrenia, presents to the emergency room with a chief complaint of a cough and chest congestion. He was hospitalized about a week ago for healthcare associated pneumonia, however he left AMA after one day. He was about to get involuntarily committed but eloped prior to having the paperwork done. States that he has been doing well, he apparently now has his own place, however he doesn't have heating and uses his oven and wall heaters to keep some warm. In the emergency room he exhibits paranoid ideation, he tells me that a lot of people in the world control him and everybody else's thoughts are causing his house to be very cold and his body to be sick, he attributes his cough and chest congestion as caused by other people's thoughts about him. Patient reports that he hasn't been taking his medications including his insulin for the last couple of days. He was seen by psychiatry in the past, however he does not trust them and is not following up regularly with any psychiatrist. He denies any depression or suicidal or homicidal ideation. He has been hospitalized multiple times due to medication noncompliance. He currently denies any abdominal  pain or any nausea or vomiting, he denies any chest pain or breathing difficulties. He endorses a cough which has been persistent over the last week, with sputum production, as well as a tooth infection. He denies any fever or chills. In  the ED, he was found to be in mild DKA, started on a glucose stabilizer   HPI Elements:   Location:  schizophrenia. Quality:  poor. Severity:  irritability, angry and paranoid delusions. Timing:  few weeks. Duration:  few years. Context:  non compliance with medications and PSI agency.  Past Medical History:  Past Medical History  Diagnosis Date  . Diabetes mellitus   . Depression   . Schizo affective schizophrenia   . Polysubstance abuse   . Scoliosis   . Chronic pain   . Noncompliance with medication regimen   . DKA (diabetic ketoacidoses) 11/18/2014  . GERD (gastroesophageal reflux disease)   . Arthritis     "both hips" (12/21/2014)   History reviewed. No pertinent past surgical history. Family History:  Family History  Problem Relation Age of Onset  . Diabetes Mother    Social History:  History  Alcohol Use  . Yes    Comment: 12/21/2014 "I drink on a rare occasion"     History  Drug Use  . Yes  . Special: Marijuana    Comment: 12/21/2014 "I use marijuana on a rare occasion", last used marijuana 01/30/2015    History   Social History  . Marital Status: Married    Spouse Name: N/A  . Number of Children: N/A  . Years of Education: N/A   Social History Main Topics  . Smoking status: Current Some Day Smoker -- 1.00 packs/day for 7 years    Types: Cigarettes  . Smokeless tobacco: Never Used  . Alcohol Use: Yes     Comment: 12/21/2014 "I drink on a rare occasion"  . Drug Use: Yes    Special: Marijuana     Comment: 12/21/2014 "I use marijuana on a rare occasion", last used marijuana 01/30/2015  . Sexual Activity: Yes   Other Topics Concern  . None   Social History Narrative   ** Merged History Encounter **       Additional Social History:                          Allergies:   Allergies  Allergen Reactions  . Sulfa Antibiotics Other (See Comments)    Unknown - Childhood    Vitals: Blood pressure 153/108, pulse 113, temperature 98.1 F (36.7 C),  temperature source Oral, resp. rate 18, height 6\' 1"  (1.854 m), weight 60.192 kg (132 lb 11.2 oz), SpO2 100 %.  Risk to Self: Is patient at risk for suicide?: No Risk to Others:   Prior Inpatient Therapy:   Prior Outpatient Therapy:    Current Facility-Administered Medications  Medication Dose Route Frequency Provider Last Rate Last Dose  . acetaminophen-codeine (TYLENOL #3) 300-30 MG per tablet 1 tablet  1 tablet Oral Q4H PRN , MD   1 tablet at 01/31/15 2023  . amphetamine-dextroamphetamine (ADDERALL) tablet 40 mg  40 mg Oral BID 2024, MD   40 mg at 02/01/15 1035  . benzonatate (TESSALON) capsule 200 mg  200 mg Oral TID PRN 02/03/15, MD      . dextrose 5 %-0.45 % sodium chloride infusion   Intravenous Continuous Leatha Gilding, PA-C 100 mL/hr at 02/01/15 0546    .  doxepin (SINEQUAN) capsule 100 mg  100 mg Oral QHS Leatha Gilding, MD   100 mg at 01/31/15 2131  . enoxaparin (LOVENOX) injection 40 mg  40 mg Subcutaneous Q24H Leatha Gilding, MD   40 mg at 01/31/15 2131  . fluticasone (FLONASE) 50 MCG/ACT nasal spray 2 spray  2 spray Each Nare Daily Costin Otelia Sergeant, MD   2 spray at 02/01/15 1000  . insulin aspart (novoLOG) injection 0-9 Units  0-9 Units Subcutaneous TID WC Costin Otelia Sergeant, MD   0 Units at 02/01/15 0800  . insulin detemir (LEVEMIR) injection 20 Units  20 Units Subcutaneous QHS Leatha Gilding, MD   20 Units at 02/01/15 0127  . levofloxacin (LEVAQUIN) IVPB 750 mg  750 mg Intravenous Q24H Leatha Gilding, MD   750 mg at 01/31/15 1804    Musculoskeletal: Strength & Muscle Tone: within normal limits Gait & Station: normal Patient leans: N/A  Psychiatric Specialty Exam: Physical Exam as per history and physical  ROS delusions and paranoid, non compliance with medication  Blood pressure 153/108, pulse 113, temperature 98.1 F (36.7 C), temperature source Oral, resp. rate 18, height 6\' 1"  (1.854 m), weight 60.192 kg (132 lb 11.2 oz), SpO2 100  %.Body mass index is 17.51 kg/(m^2).  General Appearance: Guarded  Eye Contact::  Good  Speech:  Pressured  Volume:  Increased  Mood:  Euphoric and Irritable  Affect:  Inappropriate and Labile  Thought Process:  Coherent and Goal Directed  Orientation:  Full (Time, Place, and Person)  Thought Content:  Delusions and Paranoid Ideation  Suicidal Thoughts:  No  Homicidal Thoughts:  No  Memory:  Immediate;   Fair Recent;   Fair  Judgement:  Impaired  Insight:  Lacking  Psychomotor Activity:  Increased  Concentration:  Fair  Recall:  Good  Fund of Knowledge:Good  Language: Good  Akathisia:  NA  Handed:  Right  AIMS (if indicated):     Assets:  [phone] Housing Leisure Time Physical Health Resilience Social Support  ADL's:  Intact  Cognition: WNL  Sleep:      Medical Decision Making: New problem, with additional work up planned, Review of Psycho-Social Stressors (1), Review or order clinical lab tests (1), Decision to obtain old records (1), Review and summation of old records (2), Established Problem, Worsening (2), Review or order medicine tests (1), Review of Medication Regimen & Side Effects (2) and Review of New Medication or Change in Dosage (2)  Treatment Plan Summary: Daily contact with patient to assess and evaluate symptoms and progress in treatment and Medication management  Plan: Start Seroquel 100 mg PO BID for psychosis, paranoid delusions Continue Doxepin 100 mg PO Qhs Discontinue Adderalll - has no ADHD and has substance abuse by history and no PCP Start Haldol/ativan/cogentin PRN for agitation and aggressive behaviors Patient does not meet criteria for IVC at this time based on has no A/V hallucination, not responding to internal stimuli and no SI/HI Patient does not meet criteria for psychiatric inpatient admission. Supportive therapy provided about ongoing stressors.  Disposition: May be discharged to out patient  care when medically stable. He becomes threat to himself or other, we will IVC.   [name],[name] 02/01/2015 10:58 AM

## 2015-02-01 NOTE — Progress Notes (Signed)
Per RN conversation with diabetes coordinator earlier in the day, pt encouraged to self administer dinner time dose of insulin.  RN observed patient select and prep site and self administer 7 units of insulin.  Patient able to complete task effectively and correctly. RN to continue to monitor.

## 2015-02-01 NOTE — Progress Notes (Signed)
Inpatient Diabetes Program Recommendations  AACE/ADA: New Consensus Statement on Inpatient Glycemic Control (2013)  Target Ranges:  Prepandial:   less than 140 mg/dL      Peak postprandial:   less than 180 mg/dL (1-2 hours)      Critically ill patients:  140 - 180 mg/dL   Reason for Visit: Hyperglycemia  Diabetes history: DM1 Outpatient Diabetes medications: Levemir 35 units QHS, Novolog 10 units tidwc (pt states he has not taken in last couple of days)  Current orders for Inpatient glycemic control: Levemir 20 QHS, Novolog sensitive tidwc and hs  Pt states he hasn't taken meds for past couple of days. States "I really don't want to talk with you anymore, so please leave the room." Said he did have insulin, meter and strips available to him. Tried to discuss HgbA1C and he again asked me to leave. Transitioned from [employer] to [employer]. Pt was eating a snack (eggs and bacon) after he had already eaten his lunch.  Results for KEVIS, QU (MRN [id number]) as of 02/01/2015 14:08  Ref. Range 02/01/2015 00:22 02/01/2015 01:19 02/01/2015 02:33 02/01/2015 09:18 02/01/2015 11:10  Glucose-Capillary Latest Range: 70-99 mg/dL 858 (H) 850 (H) 277 (H) 106 (H) 223 (H)   Results for WINIFRED, BALOGH (MRN [id number]) as of 02/01/2015 14:08  Ref. Range 11/18/2014 08:27 01/23/2015 15:55  Hemoglobin A1C Latest Range: 4.8-5.6 % 11.5 (H) 12.0 (H)   Needs meal coverage insulin.  Recommendations: Add Novolog 6 units tidwc for meal coverage insulin if pt eats >50% meal. Titrate Levemir if FBS >180 mg/dL.  Will follow. Discussed with RN.  Thank you. Ailene Ards, RD, LDN, CDE Inpatient Diabetes Coordinator [phone]

## 2015-02-02 LAB — BASIC METABOLIC PANEL
ANION GAP: 5 (ref 5–15)
BUN: 14 mg/dL (ref 6–23)
CALCIUM: 8.8 mg/dL (ref 8.4–10.5)
CO2: 33 mmol/L — ABNORMAL HIGH (ref 19–32)
Chloride: 100 mmol/L (ref 96–112)
Creatinine, Ser: 0.79 mg/dL (ref 0.50–1.35)
Glucose, Bld: 345 mg/dL — ABNORMAL HIGH (ref 70–99)
POTASSIUM: 4.1 mmol/L (ref 3.5–5.1)
SODIUM: 138 mmol/L (ref 135–145)

## 2015-02-02 LAB — GLUCOSE, CAPILLARY
Glucose-Capillary: 355 mg/dL — ABNORMAL HIGH (ref 70–99)
Glucose-Capillary: 206 mg/dL — ABNORMAL HIGH (ref 70–99)

## 2015-02-02 LAB — CBC
HCT: 35.7 % — ABNORMAL LOW (ref 39.0–52.0)
Hemoglobin: 11.8 g/dL — ABNORMAL LOW (ref 13.0–17.0)
MCH: 28.7 pg (ref 26.0–34.0)
MCHC: 33.1 g/dL (ref 30.0–36.0)
MCV: 86.9 fL (ref 78.0–100.0)
Platelets: 327 10*3/uL (ref 150–400)
RBC: 4.11 MIL/uL — AB (ref 4.22–5.81)
RDW: 12.9 % (ref 11.5–15.5)
WBC: 7.4 10*3/uL (ref 4.0–10.5)

## 2015-02-02 MED ORDER — DOXEPIN HCL 10 MG/ML PO CONC: 100.0000 mg | Freq: Every day | ORAL | Status: DC

## 2015-02-02 MED ORDER — INSULIN ASPART 100 UNIT/ML ~~LOC~~ SOLN: 0.0000 [IU] | Freq: Three times a day (TID) | SUBCUTANEOUS | Status: DC

## 2015-02-02 MED ORDER — AMOXICILLIN 500 MG PO CAPS: 500.0000 mg | ORAL_CAPSULE | Freq: Three times a day (TID) | ORAL | Status: DC

## 2015-02-02 MED ORDER — LORAZEPAM 0.5 MG PO TABS
0.5000 mg | ORAL_TABLET | Freq: Four times a day (QID) | ORAL | Status: DC
  Filled 2015-02-02: qty 1

## 2015-02-02 NOTE — Discharge Summary (Signed)
Physician Discharge Summary  [name] MGN:[id number] DOB: Feb 05, 1986 DOA: 01/31/2015  PCP: Angelica Chessman, MD  Admit date: 01/31/2015 Discharge date: 02/02/2015  Time spent: Less than 30 minutes  Recommendations for Outpatient Follow-up:  1. Dr. Angelica Chessman, PCP in 3 days. Follow-up regarding diabetes management. Recommend outpatient psychiatric consultation.  Discharge Diagnoses:  Active Problems:   Schizophrenia, undifferentiated   Generalized anxiety disorder   DKA, type 1   Hyperglycemia   HCAP (healthcare-associated pneumonia)   Discharge Condition: Improved & Stable  Diet recommendation: Heart healthy and diabetic diet.  Filed Weights   01/31/15 1702  Weight: 60.192 kg (132 lb 11.2 oz)    History of present illness:  29 y/o Male with PMH of DM1, recurrent DKA's, Schizophrenia, polysubstance abuse, recent hospitalization for HCAP (Left AMA), medical noncompliance presented to the ED with complaints of productive cough and chest congestion. He had not been taking his insulins for the last couple of days. He does not regularly follow-up with psychiatry. He was paranoid in the ED but denied suicidal or homicidal ideations. He was found to be in mild DKA and chest x-ray was negative for pneumonia.  Hospital Course:    Poorly controlled type I DM with mild DKA: Patient was hydrated with IV fluids and placed on IV insulin. DKA resolved. He was transitioned to Levemir and NovoLog SSI. Counseled regarding compliance with medications. This is likely to be a recurrent problem secondary to his underlying psychiatric illness. A1c: 12 recently. CBG this morning at 355. Patient uncooperative on many levels of care-does not wish for his diabetes medications to be adjusted, refusing inpatient psychiatry follow-up or new psychiatric medications and wants to take his Adderall despite psychiatrist advised that he does not have ADHD and psychiatrist has discontinued Adderall.  Patient is advised to continue his home dose of Levemir and NovoLog SSI and follow-up with PCP in the next couple of days for outpatient diabetes management.  Recent HCAP/Acute bronchitis d/t Hemophilus Influnza: Chest x-ray 2/6 showed lingual or pneumonia and early infiltrate in LLL. He was treated with IV antibiotics for 1 day before he left AMA without antibiotics. Current chest x-ray shows no pneumonia. He received a dose of cefepime in the ED. Final blood cultures from 2/6: Negative. Sputum cultures from 2/6: Haemophilus influenza. Discussed with infectious disease M.D. on call on 2/15 who recommended oral amoxicillin-complete 7 days course. Clinically improved.  Hypokalemia: Replaced  Anemia: Unclear etiology. stable.  Schizophrenia/depression: Psychiatry had consulted on 2/15 and made several medication changes including discontinuing Adderall and starting Seroquel, when necessary Haldol/Ativan/Cogentin. Patient extremely unhappy today and demanding his Adderall back. Discussed with psychiatry as who advise that patient does not have PTSD and should not be on Adderall. Patient denies hallucinations, suicidal or homicidal ideations. Psychiatry stat this time recommended that at this time since patient is not a threat to himself or others, he can be discharged home with outpatient psychiatric resources for follow-up. Requested social worker to assist with such resources. We'll DC Adderall but suspect that patient is going to continue to take it and look for physicians who will prescribe this for him.   THC abuse: Cessation counseled.  Dental caries: Outpatient follow-up with her dentist.  Consultants:  Psychiatry  Procedures:  None   Discharge Exam:  Complaints:  mild dry cough but no chest pain or dyspnea reported. No hallucinations, suicidal or homicidal ideations. Demanding to restart Adderall, refusing all other new psychiatric medications and refusing to meet with psychiatry as  again.  Filed Vitals:   01/31/15 1702 01/31/15 2013 02/01/15 1319 02/01/15 2107  BP: 120/75 153/108 133/93 107/71  Pulse: 91 113 106 114  Temp: 98 F (36.7 C) 98.1 F (36.7 C) 98.3 F (36.8 C) 98.1 F (36.7 C)  TempSrc: Oral Oral Oral Oral  Resp: $Remo'18 18 18 18  'MgdCo$ Height: $Rem'6\' 1"'mLYx$  (1.854 m)     Weight: 60.192 kg (132 lb 11.2 oz)     SpO2: 100% 100% 100% 100%    General exam: Pleasant young male sitting up comfortably in bed  Respiratory system: clear to auscultation. No increased work of breathing. Cardiovascular system: S1 & S2 heard, RRR. No JVD, murmurs, gallops, clicks or pedal edema. Gastrointestinal system: Abdomen is nondistended, soft and nontender. Normal bowel sounds heard. Central nervous system: Alert and oriented. No focal neurological deficits. Extremities: Symmetric 5 x 5 power.   Discharge Instructions      Discharge Instructions    Call MD for:  difficulty breathing, headache or visual disturbances    Complete by:  As directed      Call MD for:  extreme fatigue    Complete by:  As directed      Call MD for:  persistant dizziness or light-headedness    Complete by:  As directed      Call MD for:  temperature >100.4    Complete by:  As directed      Diet - low sodium heart healthy    Complete by:  As directed      Diet Carb Modified    Complete by:  As directed      Increase activity slowly    Complete by:  As directed             Medication List    STOP taking these medications        amphetamine-dextroamphetamine 20 MG tablet  Commonly known as:  ADDERALL     benzonatate 200 MG capsule  Commonly known as:  TESSALON     varenicline 0.5 MG X 11 & 1 MG X 42 tablet  Commonly known as:  CHANTIX STARTING MONTH PAK      TAKE these medications        acetaminophen-codeine 300-30 MG per tablet  Commonly known as:  TYLENOL #3  Take 1 tablet by mouth every 4 (four) hours as needed.     amoxicillin 500 MG capsule  Commonly known as:  AMOXIL  Take  1 capsule (500 mg total) by mouth every 8 (eight) hours.     doxepin 10 MG/ML solution  Commonly known as:  SINEQUAN  Take 10 mLs (100 mg total) by mouth at bedtime.     fluticasone 50 MCG/ACT nasal spray  Commonly known as:  FLONASE  Place 2 sprays into both nostrils daily.     freestyle lancets  Use as instructed     glucose blood test strip  Commonly known as:  FREESTYLE LITE  Use as instructed     glucose monitoring kit monitoring kit  1 each by Does not apply route as needed for other. Test blood sugars twice daily and when needed     insulin aspart 100 UNIT/ML injection  Commonly known as:  novoLOG  - Inject 0-9 Units into the skin 3 (three) times daily with meals. CBG < 70: Eat or drink something sweet and recheck.  - CBG 70 - 120: 0 units  - CBG 121 - 150: 1 unit  - CBG 151 - 200: 2  units  - CBG 201 - 250: 3 units  - CBG 251 - 300: 5 units  - CBG 301 - 350: 7 units  - CBG 351 - 400: 9 units  - CBG > 400: call MD.     insulin detemir 100 UNIT/ML injection  Commonly known as:  LEVEMIR  Inject 0.35 mLs (35 Units total) into the skin daily.     INSULIN SYRINGE .5CC/29G 29G X 1/2" 0.5 ML Misc  1 vial by Does not apply route 2 (two) times daily.     Insulin Syringe-Needle U-100 30G X 5/16" 0.5 ML Misc  Test blood glucose 3 times daily       Follow-up Information    Follow up with Jeanann Lewandowsky, MD. Schedule an appointment as soon as possible for a visit in 3 days.   Specialty:  Internal Medicine   Why:  to be seen for diabetes management and medication adjustment as needed.   Contact information:   [address] [place] [address] [phone]        The results of significant diagnostics from this hospitalization (including imaging, microbiology, ancillary and laboratory) are listed below for reference.    Significant Diagnostic Studies: Dg Chest 2 View  01/31/2015   CLINICAL DATA:  Cough.  Coldness and numbness of the hands.  EXAM:  CHEST  2 VIEW  COMPARISON:  Chest radiograph 01/23/2015  FINDINGS: Normal cardiac and mediastinal contours. Minimal scarring and or atelectasis left lung base. Lungs are hyperinflated. No pleural effusion or pneumothorax. Regional skeleton is unremarkable.  IMPRESSION: Minimal scarring and or atelectasis left lung base.  Lungs are hyperinflated.   Electronically Signed   By: Annia Belt M.D.   On: 01/31/2015 14:06   Dg Chest 2 View  01/23/2015   CLINICAL DATA:  Productive cough for 3 days with shortness of breath.  EXAM: CHEST  2 VIEW  COMPARISON:  06/17/2012.  FINDINGS: Trachea is midline. Heart size normal. There is airspace opacification in the lingula. Question early/ mild involvement of the left lower lobe as well. Lungs are otherwise clear. No pleural fluid.  IMPRESSION: Lingular pneumonia. Question mild/ early involvement of the left lower lobe as well.   Electronically Signed   By: Leanna Battles M.D.   On: 01/23/2015 13:41    Microbiology: Recent Results (from the past 240 hour(s))  Culture, blood (routine x 2) Call MD if unable to obtain prior to antibiotics being given     Status: None   Collection Time: 01/23/15  3:55 PM  Result Value Ref Range Status   Specimen Description BLOOD LEFT ARM  Final   Special Requests BOTTLES DRAWN AEROBIC ONLY 5CC  Final   Culture   Final    NO GROWTH 5 DAYS Performed at [facility]    Report Status 01/31/2015 FINAL  Final  Culture, blood (routine x 2) Call MD if unable to obtain prior to antibiotics being given     Status: None   Collection Time: 01/23/15  4:01 PM  Result Value Ref Range Status   Specimen Description BLOOD LEFT HAND  Final   Special Requests BOTTLES DRAWN AEROBIC AND ANAEROBIC 5CC  Final   Culture   Final    NO GROWTH 5 DAYS Performed at [facility]    Report Status 01/31/2015 FINAL  Final  MRSA PCR Screening     Status: None   Collection Time: 01/23/15  6:21 PM  Result Value Ref Range Status   MRSA  by PCR  NEGATIVE NEGATIVE Final    Comment:        The GeneXpert MRSA Assay (FDA approved for NASAL specimens only), is one component of a comprehensive MRSA colonization surveillance program. It is not intended to diagnose MRSA infection nor to guide or monitor treatment for MRSA infections.   Culture, sputum-assessment     Status: None   Collection Time: 01/23/15  6:48 PM  Result Value Ref Range Status   Specimen Description SPUTUM  Final   Special Requests NONE  Final   Sputum evaluation   Final    THIS SPECIMEN IS ACCEPTABLE. RESPIRATORY CULTURE REPORT TO FOLLOW.   Report Status 01/23/2015 FINAL  Final  Culture, respiratory (NON-Expectorated)     Status: None   Collection Time: 01/23/15  6:48 PM  Result Value Ref Range Status   Specimen Description SPUTUM  Final   Special Requests NONE  Final   Gram Stain   Final    FEW WBC PRESENT,BOTH PMN AND MONONUCLEAR NO SQUAMOUS EPITHELIAL CELLS SEEN NO ORGANISMS SEEN Performed at [facility]    Culture   Final    MODERATE HAEMOPHILUS INFLUENZAE Note: BETA LACTAMASE NEGATIVE Performed at [facility]    Report Status 01/26/2015 FINAL  Final     Labs: Basic Metabolic Panel:  Recent Labs Lab 01/31/15 1426 01/31/15 2006 02/01/15 0013 02/01/15 0413 02/02/15 0425  NA 135 136 136 138 138  K 3.9 4.0 3.5 3.1* 4.1  CL 92* 99 100 100 100  CO2  --  30 31 32 33*  GLUCOSE 504* 244* 158* 158* 345*  BUN $Re'9 13 14 13 14  'xrV$ CREATININE 0.60 0.73 0.60 0.53 0.79  CALCIUM  --  8.7 8.7 8.7 8.8   Liver Function Tests:  Recent Labs Lab 02/01/15 0413  AST 11  ALT 11  ALKPHOS 74  BILITOT 0.5  PROT 5.5*  ALBUMIN 2.8*   No results for input(s): LIPASE, AMYLASE in the last 168 hours. No results for input(s): AMMONIA in the last 168 hours. CBC:  Recent Labs Lab 01/31/15 1306 01/31/15 1426 02/01/15 0413 02/02/15 0425  WBC 9.2  --  7.1 7.4  NEUTROABS 7.1  --   --   --   HGB 13.6 14.3 11.0* 11.8*  HCT 39.2 42.0 32.7*  35.7*  MCV 85.8  --  85.8 86.9  PLT 375  --  315 327   Cardiac Enzymes: No results for input(s): CKTOTAL, CKMB, CKMBINDEX, TROPONINI in the last 168 hours. BNP: BNP (last 3 results) No results for input(s): BNP in the last 8760 hours.  ProBNP (last 3 results) No results for input(s): PROBNP in the last 8760 hours.  CBG:  Recent Labs Lab 02/01/15 0918 02/01/15 1110 02/01/15 1748 02/01/15 2112 02/02/15 0810  GLUCAP 106* 223* 313* 349* 355*        Signed:  Vernell Leep, MD, FACP, FHM. [facility] Pager [phone]  If 7PM-7AM, please contact night-coverage [email or web address] Password [facility] 02/02/2015, 11:04 AM

## 2015-02-02 NOTE — Progress Notes (Signed)
Patient ambulatory without assistance. Was discharged home alone in stable condition. Discharge instructions and prescriptions given. Patient verbalized understanding of discharge instructions.

## 2015-02-02 NOTE — Discharge Instructions (Signed)
Diabetic Ketoacidosis °Diabetic ketoacidosis (DKA) is a life-threatening complication of type 1 diabetes. It must be quickly recognized and treated. Treatment requires hospitalization. °CAUSES  °When there is no insulin in the body, glucose (sugar) cannot be used, and the body breaks down fat for energy. When fat breaks down, acids (ketones) build up in the blood. Very high levels of glucose and high levels of acids lead to severe loss of body fluids (dehydration) and other dangerous chemical changes. This stresses your vital organs and can cause coma or death. °SIGNS AND SYMPTOMS  °· Tiredness (fatigue). °· Weight loss. °· Excessive thirst. °· Ketones in your urine. °· Light-headedness. °· Fruity or sweet smelling breath. °· Excessive urination. °· Visual changes. °· Confusion or irritability. °· Nausea or vomiting. °· Rapid breathing. °· Stomachache or abdominal pain. °DIAGNOSIS  °Your health care provider will diagnose DKA based on your history, physical exam, and blood tests. The health care provider will check to see if you have another illness that caused you to go into DKA. Most of this will be done quickly in an emergency room. °TREATMENT  °· Fluid replacement to correct dehydration. °· Insulin. °· Correction of electrolytes, such as potassium and sodium. °· Antibiotic medicines. °PREVENTION °· Always take your insulin. Do not skip your insulin injections. °· If you are sick, treat yourself quickly. Your body often needs more insulin to fight the illness. °· Check your blood glucose regularly. °· Check urine ketones if your blood glucose is greater than 240 milligrams per deciliter (mg/dL). °· Do not use outdated (expired) insulin. °· If your blood glucose is high, drink plenty of fluids. This helps flush out ketones. °HOME CARE INSTRUCTIONS  °· If you are sick, follow the advice of your health care provider. °· To prevent dehydration, drink enough water and fluids to keep your urine clear or pale  yellow. °¨ If you cannot eat, alternate between drinking fluids with sugar (soda, juices, flavored gelatin) and salty fluids (broth, bouillon). °¨ If you can eat, follow your usual diet and drink sugar-free liquids (water, diet drinks). °· Always take your usual dose of insulin. If you cannot eat or if your glucose is getting too low, call your health care provider for further instructions. °· Continue to monitor your blood or urine ketones every 3-4 hours around the clock. Set your alarm clock or have someone wake you up. If you are too sick, have someone test it for you. °· Rest and avoid exercise. °SEEK MEDICAL CARE IF:  °· You have a fever. °· You have ketones in your urine, or your blood glucose is higher than a level your health care provider suggests. You may need extra insulin. Call your health care provider if you need advice on adjusting your insulin. °· You cannot drink at least a tablespoon (15 mL) of fluid every 15-20 minutes. °· You have been vomiting for more than 2 hours. °· You have symptoms of DKA: °¨ Fruity smelling breath. °¨ Breathing faster or slower. °¨ Becoming very sleepy. °SEEK IMMEDIATE MEDICAL CARE IF:  °· You have signs of dehydration: °¨ Decreased urination. °¨ Increased thirst. °¨ Dry skin and mouth. °¨ Light-headedness. °· Your blood glucose is very high (as advised by your health care provider) twice in a row. °· You faint. °· You have chest pain or trouble breathing. °· You have a sudden, severe headache. °· You have sudden weakness in one arm or one leg. °· You have sudden trouble speaking or swallowing. °· You   have vomiting or diarrhea that is getting worse after 3 hours. °· You have abdominal pain. °MAKE SURE YOU:  °· Understand these instructions. °· Will watch your condition. °· Will get help right away if you are not doing well or get worse. °Document Released: 12/01/2000 Document Revised: 12/09/2013 Document Reviewed: 06/09/2009 °ExitCare® Patient Information ©2015 [employer]  [employer]. This information is not intended to replace advice given to you by your health care provider. Make sure you discuss any questions you have with your health care provider. ° °

## 2015-02-02 NOTE — Progress Notes (Signed)
Inpatient Diabetes Program Recommendations  AACE/ADA: New Consensus Statement on Inpatient Glycemic Control (2013)  Target Ranges:  Prepandial:   less than 140 mg/dL      Peak postprandial:   less than 180 mg/dL (1-2 hours)      Critically ill patients:  140 - 180 mg/dL   Reason for Visit: Hyperglycemia  Results for HENRIQUE, PAREKH (MRN [id number]) as of 02/02/2015 09:41  Ref. Range 02/01/2015 17:48 02/01/2015 21:12 02/02/2015 08:10  Glucose-Capillary Latest Range: 70-99 mg/dL 622 (H) 633 (H) 354 (H)    Inpatient Diabetes Program Recommendations Insulin - Basal: Increase Levemir to 30 units QHS Insulin - Meal Coverage: Add Novolog 8 units tidwc for meal coverage insulin if pt eats >50% meal  Note: Will continue to follow. Thank you. Ailene Ards, RD, LDN, CDE Inpatient Diabetes Coordinator [phone]

## 2015-02-02 NOTE — Consult Note (Signed)
Patient evaluated for [facility] Care Management services as a benefit of his [employer] insurance. However, due to patient's ongoing psychiatric issues, unable to follow at this time. Will make inpatient RNCM aware.  Raiford Noble, MSN- RN,BSN- [facility] Liaison-[phone]

## 2015-02-02 NOTE — Progress Notes (Signed)
Pt did well during the night. Refused vital signs to be taken this morning. Resting quietly .

## 2015-02-02 NOTE — Consult Note (Signed)
Psychiatry Consult progress note  Reason for Consult:  Schizophrenia and non compliance with psych medications Referring Physician:  Dr. Pamella Pert  Patient Identification: [name] MRN:  [id number] Principal Diagnosis: <principal problem not specified> Diagnosis:   Patient Active Problem List   Diagnosis Date Noted  . HCAP (healthcare-associated pneumonia) [J18.9] 01/23/2015  . Hyperglycemia [R73.9] 12/21/2014  . Diabetic ketoacidosis without coma associated with diabetes mellitus due to underlying condition [E08.10]   . Involuntary commitment [Z04.6] 11/19/2014  . DKA (diabetic ketoacidoses) [E13.10] 11/18/2014  . Hip pain, bilateral [M25.551, M25.552] 11/18/2014  . DKA, type 1 [E10.10] 06/17/2012  . Hyponatremia [E87.1] 06/17/2012  . Dehydration [E86.0] 06/17/2012  . Generalized anxiety disorder [F41.1] 02/19/2012  . Diabetes type 1, uncontrolled [E10.65] 02/19/2012  . Schizophrenia, undifferentiated [F20.3] 02/18/2012    Class: Acute    Total Time spent with patient: 15 minutes  Subjective:   [name] is a 29 y.o. male patient admitted with psychosis and chest congestion.  HPI:  [name] is a 29 y.o. male seen and chart reviewed. Patient has stated that he has mood disorder and adderall and doxepin works for him. He also smokes THC. He has paranoid delusions about some body doing some thing to him but states that he does not want to talk about it. He feels god is there to take care of the people who trying to do some thing to him. He states that he came to the hospital because he was sick and coughing.Reportedly he has been non compliant with his insulin. He was in [employer] about a month and about two weeks ago relocated to his own apartment. [facility] but reportedly told him he needs to find a another physician. Patient reportedly trying to find another physician who can prescribe his adderall and  doxepin. Patient repeatedly stated that he has no suicide or homicide ideation, intention or plans and does not want to see psychiatrist to take psychiatric medication. He was previously treated with Prolixin and seroquel and trazodone. Reportedly he has no psych follow up after discharge. Patient does not have family, reportedly father died and mother relocated to [place]. He was briefly lived in [place] and [place].   Interim report: Case discussed with Dr. Waymon Amato, staff RN and case manager Starlyn Skeans, LCSW. Reportedly patient was unhappy about discontinuing his medication Adderall which he claims for mood disorder and denies being diagnosed with ADHD. Patient has taken Seroquel 100 mg last evening and 100 mg this morning and has no reported adverse affects. Patient slept well without any difficulties. Patient continued to request not to see psychiatric services in their inpatient or outpatient at this time. [facility] [place] but he refused to go back there. Patient will be discharged to outpatient care with the primary care physician at [facility]. Patient will be provided bus ticket as requested by case manager. Patient does not meet criteria for involuntary commitment to acute psychiatric hospitalization as he does not have threatening behavior to himself or other people during this hospitalization. Patient denies being suicidal or homicidal ideation, intention or plans.  Medical history: Patient has a past medical history significant for type 1 diabetes, noncompliance, recurrent DKA's, schizophrenia, presents to the emergency room with a chief complaint of a cough and chest congestion. He was hospitalized about a week ago for healthcare associated pneumonia, however he left AMA after one day. He was about to get involuntarily committed  but eloped prior to having the paperwork done. States that he has been doing well, he apparently now has his own place,  however he doesn't have heating and uses his oven and wall heaters to keep some warm. In the emergency room he exhibits paranoid ideation, he tells me that a lot of people in the world control him and everybody else's thoughts are causing his house to be very cold and his body to be sick, he attributes his cough and chest congestion as caused by other people's thoughts about him. Patient reports that he hasn't been taking his medications including his insulin for the last couple of days. He was seen by psychiatry in the past, however he does not trust them and is not following up regularly with any psychiatrist. He denies any depression or suicidal or homicidal ideation. He has been hospitalized multiple times due to medication noncompliance. He currently denies any abdominal pain or any nausea or vomiting, he denies any chest pain or breathing difficulties. He endorses a cough which has been persistent over the last week, with sputum production, as well as a tooth infection. He denies any fever or chills. In the ED, he was found to be in mild DKA, started on a glucose stabilizer   Past Medical History:  Past Medical History  Diagnosis Date  . Diabetes mellitus   . Depression   . Schizo affective schizophrenia   . Polysubstance abuse   . Scoliosis   . Chronic pain   . Noncompliance with medication regimen   . DKA (diabetic ketoacidoses) 11/18/2014  . GERD (gastroesophageal reflux disease)   . Arthritis     "both hips" (12/21/2014)   History reviewed. No pertinent past surgical history. Family History:  Family History  Problem Relation Age of Onset  . Diabetes Mother    Social History:  History  Alcohol Use  . Yes    Comment: 12/21/2014 "I drink on a rare occasion"     History  Drug Use  . Yes  . Special: Marijuana    Comment: 12/21/2014 "I use marijuana on a rare occasion", last used marijuana 01/30/2015    History   Social History  . Marital Status: Married    Spouse Name: N/A  .  Number of Children: N/A  . Years of Education: N/A   Social History Main Topics  . Smoking status: Current Some Day Smoker -- 1.00 packs/day for 7 years    Types: Cigarettes  . Smokeless tobacco: Never Used  . Alcohol Use: Yes     Comment: 12/21/2014 "I drink on a rare occasion"  . Drug Use: Yes    Special: Marijuana     Comment: 12/21/2014 "I use marijuana on a rare occasion", last used marijuana 01/30/2015  . Sexual Activity: Yes   Other Topics Concern  . None   Social History Narrative   ** Merged History Encounter **       Additional Social History:     Allergies:   Allergies  Allergen Reactions  . Sulfa Antibiotics Other (See Comments)    Unknown - Childhood    Vitals: Blood pressure 107/71, pulse 114, temperature 98.1 F (36.7 C), temperature source Oral, resp. rate 18, height 6\' 1"  (1.854 m), weight 60.192 kg (132 lb 11.2 oz), SpO2 100 %.  Risk to Self: Is patient at risk for suicide?: No Risk to Others:   Prior Inpatient Therapy:   Prior Outpatient Therapy:    Current Facility-Administered Medications  Medication Dose Route Frequency  Provider Last Rate Last Dose  . acetaminophen-codeine (TYLENOL #3) 300-30 MG per tablet 1 tablet  1 tablet Oral Q4H PRN Leatha Gilding, MD   1 tablet at 01/31/15 2023  . amoxicillin (AMOXIL) capsule 500 mg  500 mg Oral 3 times per day Elease Etienne, MD   500 mg at 02/02/15 0620  . benzonatate (TESSALON) capsule 200 mg  200 mg Oral TID PRN Leatha Gilding, MD      . benztropine (COGENTIN) tablet 1 mg  1 mg Oral BID PRN Nehemiah Settle, MD       Or  . benztropine mesylate (COGENTIN) injection 1 mg  1 mg Intramuscular BID PRN Nehemiah Settle, MD      . doxepin (SINEQUAN) capsule 100 mg  100 mg Oral QHS Leatha Gilding, MD   100 mg at 02/01/15 2031  . enoxaparin (LOVENOX) injection 40 mg  40 mg Subcutaneous Q24H Leatha Gilding, MD   40 mg at 02/01/15 2033  . fluticasone (FLONASE) 50 MCG/ACT nasal spray 2 spray   2 spray Each Nare Daily Costin Otelia Sergeant, MD   2 spray at 02/02/15 1029  . haloperidol lactate (HALDOL) injection 5 mg  5 mg Intramuscular Q6H PRN Nehemiah Settle, MD      . insulin aspart (novoLOG) injection 0-5 Units  0-5 Units Subcutaneous QHS Elease Etienne, MD   4 Units at 02/01/15 2136  . insulin aspart (novoLOG) injection 0-9 Units  0-9 Units Subcutaneous TID WC Elease Etienne, MD   9 Units at 02/02/15 [phone]  . insulin detemir (LEVEMIR) injection 20 Units  20 Units Subcutaneous QHS Leatha Gilding, MD   20 Units at 02/01/15 2135  . LORazepam (ATIVAN) tablet 0.5 mg  0.5 mg Oral 4 times per day Elease Etienne, MD   0.5 mg at 02/02/15 0800  . QUEtiapine (SEROQUEL) tablet 100 mg  100 mg Oral BID Nehemiah Settle, MD   100 mg at 02/02/15 1029    Musculoskeletal: Strength & Muscle Tone: within normal limits Gait & Station: normal Patient leans: N/A  Psychiatric Specialty Exam: Physical Exam as per history and physical  ROS: Noncooperative with mental health services and willing to see primary care physician.  Blood pressure 107/71, pulse 114, temperature 98.1 F (36.7 C), temperature source Oral, resp. rate 18, height 6\' 1"  (1.854 m), weight 60.192 kg (132 lb 11.2 oz), SpO2 100 %.Body mass index is 17.51 kg/(m^2).  General Appearance: Guarded  Eye Contact::  Good  Speech:  Clear and Coherent  Volume:  Normal  Mood:  Euphoric  Affect:  Congruent and Constricted  Thought Process:  Coherent and Goal Directed  Orientation:  Full (Time, Place, and Person)  Thought Content:  Paranoid Ideation  Suicidal Thoughts:  No  Homicidal Thoughts:  No  Memory:  Immediate;   Fair Recent;   Fair  Judgement:  Impaired  Insight:  Lacking  Psychomotor Activity:  Increased  Concentration:  Fair  Recall:  Good  Fund of Knowledge:Good  Language: Good  Akathisia:  NA  Handed:  Right  AIMS (if indicated):     Assets:  [phone] Housing Leisure Time Physical Health Resilience Social Support  ADL's:  Intact  Cognition: WNL  Sleep:      Medical Decision Making: New problem, with additional work up planned, Review of Psycho-Social Stressors (1), Review or order clinical lab tests (1), Decision to obtain old records (1), Review and summation  of old records (2), Established Problem, Worsening (2), Review or order medicine tests (1), Review of Medication Regimen & Side Effects (2) and Review of New Medication or Change in Dosage (2)  Treatment Plan Summary: Daily contact with patient to assess and evaluate symptoms and progress in treatment and Medication management  Plan: Continue Seroquel 100 mg PO BID for paranoid delusions Continue Doxepin 100 mg PO Qhs for insomnia  Discontinue Adderalll - has no ADHD and has substance abuse by history and no PCP Patient does not meet criteria for IVC at this time based on has no A/V hallucination and no SI/HI Patient does not meet criteria for psychiatric inpatient admission. Supportive therapy provided about ongoing stressors.  Disposition: May discharged to out patient care when medically stable.   Darnetta Kesselman,[name] 02/02/2015 11:57 AM

## 2015-02-02 NOTE — Progress Notes (Signed)
Clinical Social Work  Per chart review, patient is medically stable to DC today. CSW met with patient at bedside. Patient reports he slept well last night and is feeling well today. Patient reports he is happy and glad that he will DC today. Patient denies any SI or HI or any psychotic features. Patient reports that he plans [facility]. CSW spoke with patient about his plans for his psychotropic medications since the clinic is unable to prescribe those medications. Patient reports he is not interested in any referrals to psychiatrist, therapy or support groups. Patient reports he does not have any MH concerns and stated, "I'll find a doctor to write me a prescription for Adderall so I'm not worried about it." Patient reports his apartment is off of [address] and he usually walks but his apartment is too far to walk to. Patient requested taxi voucher but patient is stable and has no need for taxi. CSW offered a bus pass and patient agreeable to pass. Patient declined any directions to ride the bus and reports he knows how to ride routes to get home. Bus pass provided to RN for when patient is ready to leave.  CSW staffed case with psych MD and updated MD on plans that patient is refusing outpatient follow up. Patient contracts for safety and psych MD aware of DC plans.   CSW is signing off but available if further needs arise.  [place], [place] [phone]

## 2015-02-02 NOTE — Care Management Note (Signed)
CARE MANAGEMENT NOTE 02/02/2015  Patient:  [name], [name]   Account Number:  [id number]  Date Initiated:  02/02/2015  Documentation initiated by:  Marney Doctor  Subjective/Objective Assessment:   29 yo admitted with DKA     Action/Plan:   [place] home alone   Anticipated DC Date:  02/02/2015   Anticipated DC Plan:  [place]  CM consult      Choice offered to / List presented to:             Status of service:   Medicare Important Message given?   (If response is "NO", the following Medicare IM given date fields will be blank) Date Medicare IM given:   Medicare IM given by:   Date Additional Medicare IM given:   Additional Medicare IM given by:    Discharge Disposition:    Per UR Regulation:  Reviewed for med. necessity/level of care/duration of stay  If discussed at [place] of Stay Meetings, dates discussed:    Comments:  Marney Doctor RN,BSN,NCM [phone] CM consult for medication needs. Met with pt at bedside. Pt states he has a PCP and his [employer] insurance is current.  Pt asked if he is able to obtain his medications at DC and he states that yes, he is fine obtaining them from his regular pharmacy.  No other CM needs noted for DC.

## 2015-02-05 ENCOUNTER — Encounter (HOSPITAL_COMMUNITY): Payer: Self-pay

## 2015-02-05 ENCOUNTER — Emergency Department (INDEPENDENT_AMBULATORY_CARE_PROVIDER_SITE_OTHER)
Admission: EM | Admit: 2015-02-05 | Discharge: 2015-02-05 | Disposition: A | Discharge status: Home / Self Care | Payer: PPO | Source: Home / Self Care | Attending: Emergency Medicine | Admitting: Emergency Medicine

## 2015-02-05 DIAGNOSIS — Z9119 Patient's noncompliance with other medical treatment and regimen: Secondary | ICD-10-CM

## 2015-02-05 DIAGNOSIS — Z9114 Patient's other noncompliance with medication regimen: Secondary | ICD-10-CM

## 2015-02-05 MED ORDER — INSULIN DETEMIR 100 UNIT/ML ~~LOC~~ SOLN: 35.0000 [IU] | Freq: Every day | SUBCUTANEOUS | Status: DC

## 2015-02-05 MED ORDER — DOXEPIN HCL 100 MG PO CAPS: 100.0000 mg | ORAL_CAPSULE | Freq: Every day | ORAL | Status: DC

## 2015-02-05 NOTE — ED Provider Notes (Signed)
CSN: [id number]     Arrival date & time 02/05/15  1555 History   First MD Initiated Contact with Patient 02/05/15 1642     Chief Complaint  Patient presents with  . Medication Refill   (Consider location/radiation/quality/duration/timing/severity/associated sxs/prior Treatment) HPI Comments: Patient presents to [facility] requesting medication refill Rxs. States he was recently seen by provider at [facility] who agreed to write him for his levemir, doxepin and adderall. States that when he while he was en route to have these prescriptions filled at [facility] today, he somehow lost all of his prescriptions. He then proceeds to try to explain that he had a doctor write him for the same medications one month ago and he was told that they would not continue to write for psychiatric medications without his being involved with counseling or psychiatry.  History provided by patient is quite convoluted and fragmented, story changes frequently during conversation and above serves as a synopsis of what I feel patient is trying to communicate. He also spends several minutes talking about his former providers in other states and the various medications they used to provide for him. He seems to focus most intently on his need for his adderall prescription.   The history is provided by the patient.    Past Medical History  Diagnosis Date  . Diabetes mellitus   . Depression   . Schizo affective schizophrenia   . Polysubstance abuse   . Scoliosis   . Chronic pain   . Noncompliance with medication regimen   . DKA (diabetic ketoacidoses) 11/18/2014  . GERD (gastroesophageal reflux disease)   . Arthritis     "both hips" (12/21/2014)   History reviewed. No pertinent past surgical history. Family History  Problem Relation Age of Onset  . Diabetes Mother    History  Substance Use Topics  . Smoking status: Current Some Day Smoker -- 1.00 packs/day for 7 years    Types: Cigarettes  . Smokeless  tobacco: Never Used  . Alcohol Use: Yes     Comment: 12/21/2014 "I drink on a rare occasion"    Review of Systems  Psychiatric/Behavioral: Positive for agitation. Negative for suicidal ideas, hallucinations, confusion and self-injury. The patient is not nervous/anxious.     Allergies  Sulfa antibiotics  Home Medications   Prior to Admission medications   Medication Sig Start Date End Date Taking? Authorizing Provider  amoxicillin (AMOXIL) 500 MG capsule Take 1 capsule (500 mg total) by mouth every 8 (eight) hours. 02/02/15  Yes Modena Jansky, MD  acetaminophen-codeine (TYLENOL #3) 300-30 MG per tablet Take 1 tablet by mouth every 4 (four) hours as needed. Patient taking differently: Take 1 tablet by mouth every 4 (four) hours as needed for moderate pain.  12/24/14   Tresa Garter, MD  doxepin (SINEQUAN) 100 MG capsule Take 1 capsule (100 mg total) by mouth at bedtime. 02/05/15   Audelia Hives Jenaro Souder, PA  fluticasone (FLONASE) 50 MCG/ACT nasal spray Place 2 sprays into both nostrils daily. 12/30/14   Harden Mo, MD  glucose blood (FREESTYLE LITE) test strip Use as instructed Patient taking differently: 1 each by Other route 3 (three) times daily. Use as instructed 12/24/14   Tresa Garter, MD  glucose monitoring kit (FREESTYLE) monitoring kit 1 each by Does not apply route as needed for other. Test blood sugars twice daily and when needed 01/12/15   Tresa Garter, MD  insulin aspart (NOVOLOG) 100 UNIT/ML injection Inject 0-9  Units into the skin 3 (three) times daily with meals. CBG < 70: Eat or drink something sweet and recheck. CBG 70 - 120: 0 units CBG 121 - 150: 1 unit CBG 151 - 200: 2 units CBG 201 - 250: 3 units CBG 251 - 300: 5 units CBG 301 - 350: 7 units CBG 351 - 400: 9 units CBG > 400: call MD. 02/02/15   Modena Jansky, MD  insulin detemir (LEVEMIR) 100 UNIT/ML injection Inject 0.35 mLs (35 Units total) into the skin daily. 12/22/14   Ripudeep Krystal Eaton, MD   insulin detemir (LEVEMIR) 100 UNIT/ML injection Inject 0.35 mLs (35 Units total) into the skin daily. 02/05/15   Audelia Hives Ervan Heber, PA  INSULIN SYRINGE .5CC/29G 29G X 1/2" 0.5 ML MISC 1 vial by Does not apply route 2 (two) times daily. Patient taking differently: 1 vial by Does not apply route 3 (three) times daily.  12/22/14   Lacy Duverney, PA-C  Insulin Syringe-Needle U-100 30G X 5/16" 0.5 ML MISC Test blood glucose 3 times daily 12/24/14   Tresa Garter, MD  Lancets (FREESTYLE) lancets Use as instructed Patient taking differently: 1 each by Other route 3 (three) times daily. Use as instructed 12/24/14   Tresa Garter, MD   BP 116/77 mmHg  Pulse 104  Temp(Src) 98.1 F (36.7 C) (Oral)  Resp 20  SpO2 96% Physical Exam  Constitutional: He is oriented to person, place, and time. He appears well-developed and well-nourished. No distress.  HENT:  Head: Normocephalic and atraumatic.  Eyes: Conjunctivae are normal.  Cardiovascular: Normal rate.   Pulmonary/Chest: Effort normal.  Musculoskeletal: Normal range of motion.  Neurological: He is alert and oriented to person, place, and time.  Skin: Skin is warm and dry.  Psychiatric: His speech is normal. His mood appears anxious. His affect is blunt. He is agitated. He expresses no homicidal and no suicidal ideation. He expresses no suicidal plans and no homicidal plans.  Nursing note and vitals reviewed.   ED Course  Procedures (including critical care time) Labs Review Labs Reviewed - No data to display  Imaging Review No results found.   MDM   1. H/O medication noncompliance    Patient's records reviewed along with [place] Controlled Substance registry. I see no record of patient having been prescribed Adderall or having filled a prescription for this medication. Patient was recently admitted to [facility] for CAP and was evaluated daily by psychiatry during his admission with most recent evaluation on 02/02/2015. This  evaluation states clearly that patient should not be taking Adderall and that it should not be prescribed given his history of polysubstance abuse. When I advised patient that I would be happy to provide Rx for 3 days of his levamir and doxepin (these are the other prescriptions he states he has lost) until he can follow up at [facility], he seemed thankful. I suggested he seek outpatient psychiatric services at [facility] and he stated he did not want to do this. He just "wants someone to write the medication (Adderall) for me." However, when I advised him that psychiatry advised against Adderall, he became upset and agitated. I explained to him that I would not be at all comfortable prescribing him a medication that a specialist has clearly stated he should not be taking. He was not pleased with this answer, but when he was offered something to drink and his discharge paperwork, he quietly left the clinic.     Annett Gula  Renelda Loma, PA 02/05/15 1821

## 2015-02-05 NOTE — Discharge Instructions (Signed)
Please contact your doctor and follow up at [facility] for additional refills.

## 2015-02-05 NOTE — ED Notes (Signed)
States he received new Rx from his medical provider yesterday, went to 2-3 different areas and then went back to 1st pharmacy , only to find there was no rx at either pharmacy. Asking for replacement Rx, as his Rx writer denied a new rewrite

## 2015-02-08 LAB — GLYCOHEMOGLOBIN, TOTAL: HEMOGLOBIN-A1C: 19.2 % — AB (ref 5.4–7.4)

## 2015-03-18 ENCOUNTER — Emergency Department (HOSPITAL_COMMUNITY)
Admission: EM | Admit: 2015-03-18 | Discharge: 2015-03-19 | Disposition: A | Discharge status: Home / Self Care | Payer: PPO | Attending: Emergency Medicine | Admitting: Emergency Medicine

## 2015-03-18 ENCOUNTER — Encounter (HOSPITAL_COMMUNITY): Payer: Self-pay | Admitting: Emergency Medicine

## 2015-03-18 DIAGNOSIS — R000 Tachycardia, unspecified: Secondary | ICD-10-CM | POA: Insufficient documentation

## 2015-03-18 DIAGNOSIS — Z79899 Other long term (current) drug therapy: Secondary | ICD-10-CM | POA: Diagnosis not present

## 2015-03-18 DIAGNOSIS — F29 Unspecified psychosis not due to a substance or known physiological condition: Secondary | ICD-10-CM

## 2015-03-18 DIAGNOSIS — Z7951 Long term (current) use of inhaled steroids: Secondary | ICD-10-CM | POA: Insufficient documentation

## 2015-03-18 DIAGNOSIS — Z8739 Personal history of other diseases of the musculoskeletal system and connective tissue: Secondary | ICD-10-CM | POA: Insufficient documentation

## 2015-03-18 DIAGNOSIS — Z046 Encounter for general psychiatric examination, requested by authority: Secondary | ICD-10-CM

## 2015-03-18 DIAGNOSIS — Z720 Tobacco use: Secondary | ICD-10-CM | POA: Diagnosis not present

## 2015-03-18 DIAGNOSIS — Z8719 Personal history of other diseases of the digestive system: Secondary | ICD-10-CM | POA: Insufficient documentation

## 2015-03-18 DIAGNOSIS — Z794 Long term (current) use of insulin: Secondary | ICD-10-CM | POA: Diagnosis not present

## 2015-03-18 DIAGNOSIS — Z008 Encounter for other general examination: Secondary | ICD-10-CM | POA: Diagnosis present

## 2015-03-18 DIAGNOSIS — E119 Type 2 diabetes mellitus without complications: Secondary | ICD-10-CM | POA: Diagnosis not present

## 2015-03-18 DIAGNOSIS — Z792 Long term (current) use of antibiotics: Secondary | ICD-10-CM | POA: Insufficient documentation

## 2015-03-18 DIAGNOSIS — F203 Undifferentiated schizophrenia: Secondary | ICD-10-CM | POA: Diagnosis present

## 2015-03-18 DIAGNOSIS — G8929 Other chronic pain: Secondary | ICD-10-CM | POA: Insufficient documentation

## 2015-03-18 LAB — URINE MICROSCOPIC-ADD ON

## 2015-03-18 LAB — CBG MONITORING, ED
GLUCOSE-CAPILLARY: 118 mg/dL — AB (ref 70–99)
Glucose-Capillary: 122 mg/dL — ABNORMAL HIGH (ref 70–99)

## 2015-03-18 LAB — COMPREHENSIVE METABOLIC PANEL
ALBUMIN: 4.5 g/dL (ref 3.5–5.2)
ALT: 19 U/L (ref 0–53)
AST: 23 U/L (ref 0–37)
Alkaline Phosphatase: 86 U/L (ref 39–117)
Anion gap: 15 (ref 5–15)
BUN: 16 mg/dL (ref 6–23)
CALCIUM: 9.6 mg/dL (ref 8.4–10.5)
CO2: 26 mmol/L (ref 19–32)
Chloride: 94 mmol/L — ABNORMAL LOW (ref 96–112)
Creatinine, Ser: 0.7 mg/dL (ref 0.50–1.35)
GFR calc Af Amer: >90 mL/min (ref >90)
Glucose, Bld: 452 mg/dL — ABNORMAL HIGH (ref 70–99)
Potassium: 4.4 mmol/L (ref 3.5–5.1)
SODIUM: 135 mmol/L (ref 135–145)
Total Bilirubin: 0.8 mg/dL (ref 0.3–1.2)
Total Protein: 7 g/dL (ref 6.0–8.3)

## 2015-03-18 LAB — CBC
HCT: 43.1 % (ref 39.0–52.0)
Hemoglobin: 14.4 g/dL (ref 13.0–17.0)
MCH: 28.9 pg (ref 26.0–34.0)
MCHC: 33.4 g/dL (ref 30.0–36.0)
MCV: 86.5 fL (ref 78.0–100.0)
PLATELETS: 232 10*3/uL (ref 150–400)
RBC: 4.98 MIL/uL (ref 4.22–5.81)
RDW: 12.9 % (ref 11.5–15.5)
WBC: 7.6 10*3/uL (ref 4.0–10.5)

## 2015-03-18 LAB — RAPID URINE DRUG SCREEN, HOSP PERFORMED
Amphetamines: NOT DETECTED
BENZODIAZEPINES: NOT DETECTED
Barbiturates: NOT DETECTED
Cocaine: NOT DETECTED
OPIATES: NOT DETECTED
Tetrahydrocannabinol: NOT DETECTED

## 2015-03-18 LAB — URINALYSIS, ROUTINE W REFLEX MICROSCOPIC
Bilirubin Urine: NEGATIVE
HGB URINE DIPSTICK: NEGATIVE
KETONES UR: 40 mg/dL — AB
Leukocytes, UA: NEGATIVE
Nitrite: NEGATIVE
Protein, ur: NEGATIVE mg/dL
Specific Gravity, Urine: 1.043 — ABNORMAL HIGH (ref 1.005–1.030)
UROBILINOGEN UA: 0.2 mg/dL (ref 0.0–1.0)
pH: 6.5 (ref 5.0–8.0)

## 2015-03-18 LAB — SALICYLATE LEVEL

## 2015-03-18 LAB — ACETAMINOPHEN LEVEL: Acetaminophen (Tylenol), Serum: <10 ug/mL — ABNORMAL LOW (ref 10–30)

## 2015-03-18 LAB — ETHANOL: Alcohol, Ethyl (B): <5 mg/dL (ref 0–9)

## 2015-03-18 MED ORDER — ACETAMINOPHEN 325 MG PO TABS: 650.0000 mg | ORAL_TABLET | ORAL | Status: DC | PRN

## 2015-03-18 MED ORDER — ONDANSETRON HCL 4 MG PO TABS: 4.0000 mg | ORAL_TABLET | Freq: Three times a day (TID) | ORAL | Status: DC | PRN

## 2015-03-18 MED ORDER — INSULIN DETEMIR 100 UNIT/ML ~~LOC~~ SOLN
35.0000 [IU] | Freq: Every day | SUBCUTANEOUS | Status: DC
  Administered 2015-03-19: 35 [IU] via SUBCUTANEOUS
  Filled 2015-03-18 (×2): qty 0.35

## 2015-03-18 MED ORDER — LORAZEPAM 2 MG/ML IJ SOLN: 2.0000 mg | Freq: Once | INTRAMUSCULAR | Status: DC

## 2015-03-18 MED ORDER — ALUM & MAG HYDROXIDE-SIMETH 200-200-20 MG/5ML PO SUSP: 30.0000 mL | ORAL | Status: DC | PRN

## 2015-03-18 MED ORDER — ZIPRASIDONE MESYLATE 20 MG IM SOLR: 20.0000 mg | Freq: Once | INTRAMUSCULAR | Status: DC

## 2015-03-18 MED ORDER — LORAZEPAM 1 MG PO TABS
1.0000 mg | ORAL_TABLET | Freq: Three times a day (TID) | ORAL | Status: DC | PRN
  Administered 2015-03-19: 1 mg via ORAL
  Filled 2015-03-18: qty 1

## 2015-03-18 MED ORDER — SODIUM CHLORIDE 0.9 % IV BOLUS (SEPSIS)
1000.0000 mL | Freq: Once | INTRAVENOUS | Status: AC
  Administered 2015-03-18: 1000 mL via INTRAVENOUS

## 2015-03-18 MED ORDER — LORAZEPAM 2 MG/ML IJ SOLN
2.0000 mg | Freq: Once | INTRAMUSCULAR | Status: AC
  Administered 2015-03-18: 2 mg via INTRAVENOUS
  Filled 2015-03-18: qty 1

## 2015-03-18 MED ORDER — INSULIN ASPART 100 UNIT/ML ~~LOC~~ SOLN
10.0000 [IU] | Freq: Once | SUBCUTANEOUS | Status: AC
  Administered 2015-03-18: 10 [IU] via INTRAVENOUS
  Filled 2015-03-18: qty 1

## 2015-03-18 MED ORDER — DOXEPIN HCL 100 MG PO CAPS
100.0000 mg | ORAL_CAPSULE | Freq: Every day | ORAL | Status: DC
  Filled 2015-03-18 (×2): qty 1

## 2015-03-18 MED ORDER — HALOPERIDOL LACTATE 5 MG/ML IJ SOLN
5.0000 mg | Freq: Once | INTRAMUSCULAR | Status: AC
  Administered 2015-03-18: 5 mg via INTRAVENOUS
  Filled 2015-03-18: qty 1

## 2015-03-18 MED ORDER — INSULIN ASPART 100 UNIT/ML ~~LOC~~ SOLN
0.0000 [IU] | Freq: Three times a day (TID) | SUBCUTANEOUS | Status: DC
  Administered 2015-03-19: 100 [IU] via SUBCUTANEOUS
  Administered 2015-03-19: 09:00:00 via SUBCUTANEOUS

## 2015-03-18 MED ORDER — IBUPROFEN 200 MG PO TABS: 600.0000 mg | ORAL_TABLET | Freq: Three times a day (TID) | ORAL | Status: DC | PRN

## 2015-03-18 MED ORDER — FLUTICASONE PROPIONATE 50 MCG/ACT NA SUSP
2.0000 | Freq: Every day | NASAL | Status: DC
  Administered 2015-03-19: 2 via NASAL
  Filled 2015-03-18: qty 16

## 2015-03-18 NOTE — ED Provider Notes (Signed)
CSN: [id number]     Arrival date & time 03/18/15  1818 History   First MD Initiated Contact with Patient 03/18/15 1845     Chief Complaint  Patient presents with  . Medical Clearance     (Consider location/radiation/quality/duration/timing/severity/associated sxs/prior Treatment) HPI Comments: [name] is a 29 y.o. male with a PMHx of DM, depression, schizoaffective schizophrenia, polysubstance abuse, chronic pain, scoliosis, noncompliance, GERD, and b/l hip arthritis, who presents to the ED accompanied by law enforcement who were called out for bizarre behavior. LEVEL 5 CAVEAT: PT PSYCHOTIC AND UNCOOPERATIVE, DOES NOT PROVIDE HISTORY. Nursing notes state that the patient was brought in for abnormal behavior. Chart review reveals that he does have a psychiatric history, it is unclear whether he is compliant with medications. The [place] and that [place]med[place]." He provides no further information, and when asked if he is compliant with his medications, he states that medications don't exist and rambles on about which vein to put his insulin in. Unable to obtain any further history.  Patient is a 29 y.o. male presenting with mental health disorder. The history is provided by medical records. The history is limited by the condition of the patient. No language interpreter was used.  Mental Health Problem Presenting symptoms: bizarre behavior and disorganized speech   Patient accompanied by:  Law enforcement Degree of incapacity (severity):  Severe Onset quality:  Unable to specify Timing:  Unable to specify Progression:  Unable to specify Chronicity:  Recurrent Treatment compliance:  Unable to specify Risk factors: hx of mental illness     Past Medical History  Diagnosis Date  . Diabetes mellitus   . Depression   . Schizo affective schizophrenia   . Polysubstance abuse   . Scoliosis   . Chronic pain   .  Noncompliance with medication regimen   . DKA (diabetic ketoacidoses) 11/18/2014  . GERD (gastroesophageal reflux disease)   . Arthritis     "both hips" (12/21/2014)   History reviewed. No pertinent past surgical history. Family History  Problem Relation Age of Onset  . Diabetes Mother    History  Substance Use Topics  . Smoking status: Current Some Day Smoker -- 1.00 packs/day for 7 years    Types: Cigarettes  . Smokeless tobacco: Never Used  . Alcohol Use: Yes     Comment: 12/21/2014 "I drink on a rare occasion"    Review of Systems  Unable to perform ROS: Psychiatric disorder    Allergies  Sulfa antibiotics  Home Medications   Prior to Admission medications   Medication Sig Start Date End Date Taking? Authorizing Provider  acetaminophen-codeine (TYLENOL #3) 300-30 MG per tablet Take 1 tablet by mouth every 4 (four) hours as needed. Patient taking differently: Take 1 tablet by mouth every 4 (four) hours as needed for moderate pain.  12/24/14   Tresa Garter, MD  amoxicillin (AMOXIL) 500 MG capsule Take 1 capsule (500 mg total) by mouth every 8 (eight) hours. 02/02/15   Modena Jansky, MD  doxepin (SINEQUAN) 100 MG capsule Take 1 capsule (100 mg total) by mouth at bedtime. 02/05/15   Audelia Hives Presson, PA  fluticasone (FLONASE) 50 MCG/ACT nasal spray Place 2 sprays into both nostrils daily. 12/30/14   Harden Mo, MD  glucose blood (FREESTYLE LITE) test strip Use as instructed Patient taking differently: 1 each by Other route 3 (three) times daily. Use as instructed 12/24/14  Tresa Garter, MD  glucose monitoring kit (FREESTYLE) monitoring kit 1 each by Does not apply route as needed for other. Test blood sugars twice daily and when needed 01/12/15   Tresa Garter, MD  insulin aspart (NOVOLOG) 100 UNIT/ML injection Inject 0-9 Units into the skin 3 (three) times daily with meals. CBG < 70: Eat or drink something sweet and recheck. CBG 70 - 120: 0 units CBG  121 - 150: 1 unit CBG 151 - 200: 2 units CBG 201 - 250: 3 units CBG 251 - 300: 5 units CBG 301 - 350: 7 units CBG 351 - 400: 9 units CBG > 400: call MD. 02/02/15   Modena Jansky, MD  insulin detemir (LEVEMIR) 100 UNIT/ML injection Inject 0.35 mLs (35 Units total) into the skin daily. 12/22/14   Ripudeep Krystal Eaton, MD  insulin detemir (LEVEMIR) 100 UNIT/ML injection Inject 0.35 mLs (35 Units total) into the skin daily. 02/05/15   Audelia Hives Presson, PA  INSULIN SYRINGE .5CC/29G 29G X 1/2" 0.5 ML MISC 1 vial by Does not apply route 2 (two) times daily. Patient taking differently: 1 vial by Does not apply route 3 (three) times daily.  12/22/14   Sahar Heloise Beecham, PA-C  Insulin Syringe-Needle U-100 30G X 5/16" 0.5 ML MISC Test blood glucose 3 times daily 12/24/14   Tresa Garter, MD  Lancets (FREESTYLE) lancets Use as instructed Patient taking differently: 1 each by Other route 3 (three) times daily. Use as instructed 12/24/14   Tresa Garter, MD   BP 130/93 mmHg  Pulse 108  Temp(Src) 98.7 F (37.1 C) (Oral)  Resp 18  SpO2 100% Physical Exam  Constitutional: He appears well-developed and well-nourished. He is uncooperative.  Non-toxic appearance. No distress.  Afebrile, nontoxic, rambling about "[place]", tangential speech, tachycardic. Uncooperative with exam, not allowing for full physical, cursing  HENT:  Head: Normocephalic and atraumatic.  Mouth/Throat: Oropharynx is clear and moist. Mucous membranes are dry.  Mildly dry mucous membranes  Eyes: Conjunctivae and EOM are normal. Right eye exhibits no discharge. Left eye exhibits no discharge.  Neck: Normal range of motion. Neck supple.  Cardiovascular: Regular rhythm and intact distal pulses.  Tachycardia present.   Tachycardic, reg rhythm, unable to auscultate due to pt talking and uncooperativeness  Pulmonary/Chest: Effort normal. No respiratory distress.  Unable to auscultate due to uncooperativeness  Abdominal: Normal  appearance. He exhibits no distension.  nondistended Uncooperative for exam  Musculoskeletal: Normal range of motion.  MAE x4  Neurological: He is alert. He has normal strength.  Uncooperative for exam Does not answer questions appropriately, unable to determine orientation status  Skin: Skin is warm and dry. No rash noted.  Scratches to R wrist, no open wounds noted over exposed surfaces  Psychiatric: His speech is rapid and/or pressured and tangential. He is combative.  Bizarre behavior, tangential and rapid speech, somewhat combative with exam, uncooperative  Nursing note and vitals reviewed.   ED Course  Procedures (including critical care time) CRITICAL CARE- acute psychosis Performed by: Corine [facility]   Total critical care time: 30 minutes  Critical care time was exclusive of separately billable procedures and treating other patients.  Critical care was necessary to treat or prevent imminent or life-threatening deterioration.  Critical care was time spent personally by me on the following activities: development of treatment plan with patient and/or surrogate as well as nursing, discussions with consultants, evaluation of patient's response to treatment, examination of patient, obtaining  history from patient or surrogate, ordering and performing treatments and interventions, ordering and review of laboratory studies, ordering and review of radiographic studies, pulse oximetry and re-evaluation of patient's condition.   Labs Review Labs Reviewed  ACETAMINOPHEN LEVEL - Abnormal; Notable for the following:    Acetaminophen (Tylenol), Serum <10.0 (*)    All other components within normal limits  COMPREHENSIVE METABOLIC PANEL - Abnormal; Notable for the following:    Chloride 94 (*)    Glucose, Bld 452 (*)    All other components within normal limits  URINALYSIS, ROUTINE W REFLEX MICROSCOPIC - Abnormal; Notable for the following:    Specific Gravity, Urine  1.043 (*)    Glucose, UA >1000 (*)    Ketones, ur 40 (*)    All other components within normal limits  CBG MONITORING, ED - Abnormal; Notable for the following:    Glucose-Capillary 118 (*)    All other components within normal limits  CBG MONITORING, ED - Abnormal; Notable for the following:    Glucose-Capillary 122 (*)    All other components within normal limits  CBC  ETHANOL  SALICYLATE LEVEL  URINE RAPID DRUG SCREEN (HOSP PERFORMED)  URINE MICROSCOPIC-ADD ON    Imaging Review No results found.   EKG Interpretation None      MDM   Final diagnoses:  Psychosis, unspecified psychosis type  Involuntary commitment    29 y.o. male here with bizarre behavior, unable to obtain any history or perform much of a physical exam due to patient being uncooperative and combative, verbally inappropriate cursing at me during exam. Pt tangential and does not answer questions, [place] and very disorganized thought process. Pt will need to be IVC'd for his protection. Will get labs and attempt to medically clear. CBG reported as being performed, will attempt to find out value prior to feeding, but will give fluids and carb modified diet. Will get med rec from pharmacy.  0:78 PM APAP and salicylate level WNL. EtOH WNL. CBC WNL. CMP showing glucose 452 without anion gap or bicarb abnormalities. Will give insulin now. Awaiting UDS and U/A then will medically clear patient once CBG has come down.  8:20 PM UDS clear. U/A with mild ketones but doubt DKA due to no anion gap or bicarb changes. Nursing reporting that pt is becoming more agitated, would like IV haldol and ativan instead of IM geodon and ativan. Will give this now. Will reorder home meds. Will give 2L fluids before discontinuing his IV but pt is otherwise medically cleared.  8:53 PM TTS calling back, stated he's too sedated to be assessed. Requested that I cancel the order and consult again when he's awake.  11:40  PM Nursing staff reporting his CBG is 118, will hold levemir tonight since pt isn't eating, will reassess in the AM and monitor sugar per orders. Pt still asleep. Will monitor.  1:35 AM Pt care signed over at shift change, Antonietta Breach PA-C will follow up with pt care, will consult TTS once pt is awake, he has been medically cleared, all orders are in for his chronic meds and nursing staff aware to check his sugars per protocol orders. Please see Antonietta Breach' note for further documentation of care.   BP 95/57 mmHg  Pulse 86  Temp(Src) 98.7 F (37.1 C) (Oral)  Resp 12  SpO2 100%  Meds ordered this encounter  Medications  . sodium chloride 0.9 % bolus 1,000 mL    Sig:   . amphetamine-dextroamphetamine (  ADDERALL) 20 MG tablet    Sig: Take 20 mg by mouth daily.  . insulin aspart (novoLOG) injection 10 Units    Sig:   . DISCONTD: ziprasidone (GEODON) injection 20 mg    Sig:   . DISCONTD: LORazepam (ATIVAN) injection 2 mg    Sig:   . sodium chloride 0.9 % bolus 1,000 mL    Sig:   . LORazepam (ATIVAN) injection 2 mg    Sig:   . haloperidol lactate (HALDOL) injection 5 mg    Sig:   . insulin detemir (LEVEMIR) injection 35 Units    Sig:   . fluticasone (FLONASE) 50 MCG/ACT nasal spray 2 spray    Sig:   . insulin aspart (novoLOG) injection 0-9 Units    Sig:   . doxepin (SINEQUAN) capsule 100 mg    Sig:   . alum & mag hydroxide-simeth (MAALOX/MYLANTA) 200-200-20 MG/5ML suspension 30 mL    Sig:   . ondansetron (ZOFRAN) tablet 4 mg    Sig:   . ibuprofen (ADVIL,MOTRIN) tablet 600 mg    Sig:   . acetaminophen (TYLENOL) tablet 650 mg    Sig:   . LORazepam (ATIVAN) tablet 1 mg    Sig:      Esti Demello Camprubi-Soms, PA-C 03/19/15 0136  Daleen Bo, MD 03/19/15 1116

## 2015-03-18 NOTE — ED Notes (Signed)
Per EMS called per neighbors for pt abnormal behavior. Bystanders reports paraphernalia in house.

## 2015-03-18 NOTE — ED Provider Notes (Signed)
  Face-to-face evaluation   History: He presents for evaluation of abnormal behavior. He is unable to specify why he is here, or what happened today.  Physical exam: Alert, communicative, and somewhat cooperative. He moves all extremities equally. There is no dysarthria or aphasia. He is disassociated from reality. He exhibits delusions of not having illnesses, and not recognizing usual treatments.  I completed involuntary commitment, and first opinion papers on the patient.  Medical screening examination/treatment/procedure(s) were conducted as a shared visit with non-physician practitioner(s) and myself.  I personally evaluated the patient during the encounter  Mancel Bale, MD 03/19/15 1119

## 2015-03-18 NOTE — BH Assessment (Signed)
Unable to complete assessment at this time. [name], AC reported that pt was given Geodon and Ativan. This Clinical research associate requested that the TTS consult be removed and re-ordered when pt is alert.

## 2015-03-18 NOTE — ED Notes (Signed)
Sitter requested for patient

## 2015-03-18 NOTE — ED Notes (Signed)
Bed: RESB Expected date:  Expected time:  Means of arrival:  Comments: Bizarre behavior

## 2015-03-19 ENCOUNTER — Inpatient Hospital Stay (HOSPITAL_COMMUNITY)
Admission: AD | Admit: 2015-03-19 | Discharge: 2015-03-26 | DRG: 885 | Disposition: A | Discharge status: Home / Self Care | Payer: Federal, State, Local not specified - Other | Source: Intra-hospital | Attending: Psychiatry | Admitting: Psychiatry

## 2015-03-19 DIAGNOSIS — M1990 Unspecified osteoarthritis, unspecified site: Secondary | ICD-10-CM | POA: Diagnosis present

## 2015-03-19 DIAGNOSIS — F203 Undifferentiated schizophrenia: Secondary | ICD-10-CM

## 2015-03-19 DIAGNOSIS — E1065 Type 1 diabetes mellitus with hyperglycemia: Secondary | ICD-10-CM | POA: Diagnosis present

## 2015-03-19 DIAGNOSIS — Z9114 Patient's other noncompliance with medication regimen: Secondary | ICD-10-CM | POA: Diagnosis present

## 2015-03-19 DIAGNOSIS — Z794 Long term (current) use of insulin: Secondary | ICD-10-CM

## 2015-03-19 DIAGNOSIS — F329 Major depressive disorder, single episode, unspecified: Secondary | ICD-10-CM | POA: Diagnosis present

## 2015-03-19 DIAGNOSIS — G4700 Insomnia, unspecified: Secondary | ICD-10-CM | POA: Diagnosis present

## 2015-03-19 DIAGNOSIS — M419 Scoliosis, unspecified: Secondary | ICD-10-CM | POA: Diagnosis present

## 2015-03-19 DIAGNOSIS — R197 Diarrhea, unspecified: Secondary | ICD-10-CM | POA: Diagnosis present

## 2015-03-19 DIAGNOSIS — F29 Unspecified psychosis not due to a substance or known physiological condition: Secondary | ICD-10-CM | POA: Insufficient documentation

## 2015-03-19 DIAGNOSIS — IMO0002 Reserved for concepts with insufficient information to code with codable children: Secondary | ICD-10-CM | POA: Diagnosis present

## 2015-03-19 DIAGNOSIS — K219 Gastro-esophageal reflux disease without esophagitis: Secondary | ICD-10-CM | POA: Diagnosis present

## 2015-03-19 DIAGNOSIS — F17210 Nicotine dependence, cigarettes, uncomplicated: Secondary | ICD-10-CM | POA: Diagnosis present

## 2015-03-19 DIAGNOSIS — F209 Schizophrenia, unspecified: Secondary | ICD-10-CM | POA: Diagnosis present

## 2015-03-19 DIAGNOSIS — F419 Anxiety disorder, unspecified: Secondary | ICD-10-CM | POA: Diagnosis present

## 2015-03-19 DIAGNOSIS — Z833 Family history of diabetes mellitus: Secondary | ICD-10-CM | POA: Diagnosis not present

## 2015-03-19 DIAGNOSIS — E109 Type 1 diabetes mellitus without complications: Secondary | ICD-10-CM

## 2015-03-19 LAB — GLUCOSE, CAPILLARY: GLUCOSE-CAPILLARY: 423 mg/dL — AB (ref 70–99)

## 2015-03-19 LAB — CBG MONITORING, ED
GLUCOSE-CAPILLARY: 284 mg/dL — AB (ref 70–99)
Glucose-Capillary: 459 mg/dL — ABNORMAL HIGH (ref 70–99)
Glucose-Capillary: 161 mg/dL — ABNORMAL HIGH (ref 70–99)
Glucose-Capillary: 309 mg/dL — ABNORMAL HIGH (ref 70–99)

## 2015-03-19 MED ORDER — INSULIN ASPART 100 UNIT/ML ~~LOC~~ SOLN
4.0000 [IU] | Freq: Once | SUBCUTANEOUS | Status: AC
  Administered 2015-03-19: 4 [IU] via SUBCUTANEOUS

## 2015-03-19 MED ORDER — ALUM & MAG HYDROXIDE-SIMETH 200-200-20 MG/5ML PO SUSP
30.0000 mL | ORAL | Status: DC | PRN
  Administered 2015-03-20 – 2015-03-21 (×2): 30 mL via ORAL
  Filled 2015-03-19 (×2): qty 30

## 2015-03-19 MED ORDER — BENZTROPINE MESYLATE 1 MG PO TABS
1.0000 mg | ORAL_TABLET | Freq: Two times a day (BID) | ORAL | Status: DC
  Administered 2015-03-19: 1 mg via ORAL
  Filled 2015-03-19: qty 1

## 2015-03-19 MED ORDER — MAGNESIUM HYDROXIDE 400 MG/5ML PO SUSP: 30.0000 mL | Freq: Every day | ORAL | Status: DC | PRN

## 2015-03-19 MED ORDER — NICOTINE POLACRILEX 2 MG MT GUM
2.0000 mg | CHEWING_GUM | OROMUCOSAL | Status: DC | PRN
  Administered 2015-03-19 – 2015-03-25 (×5): 2 mg via ORAL
  Filled 2015-03-19 (×4): qty 1

## 2015-03-19 MED ORDER — HYDROXYZINE HCL 25 MG PO TABS
25.0000 mg | ORAL_TABLET | Freq: Three times a day (TID) | ORAL | Status: DC | PRN
  Administered 2015-03-19 – 2015-03-24 (×9): 25 mg via ORAL
  Filled 2015-03-19 (×6): qty 1
  Filled 2015-03-19: qty 20
  Filled 2015-03-19 (×3): qty 1

## 2015-03-19 MED ORDER — INSULIN DETEMIR 100 UNIT/ML ~~LOC~~ SOLN
35.0000 [IU] | Freq: Every day | SUBCUTANEOUS | Status: DC
  Administered 2015-03-19: 35 [IU] via SUBCUTANEOUS
  Filled 2015-03-19: qty 0.35

## 2015-03-19 MED ORDER — TRAZODONE HCL 50 MG PO TABS
50.0000 mg | ORAL_TABLET | Freq: Every evening | ORAL | Status: DC | PRN
  Administered 2015-03-20 – 2015-03-22 (×3): 50 mg via ORAL
  Filled 2015-03-19 (×3): qty 1

## 2015-03-19 MED ORDER — ACETAMINOPHEN 325 MG PO TABS
650.0000 mg | ORAL_TABLET | Freq: Four times a day (QID) | ORAL | Status: DC | PRN
  Administered 2015-03-24 (×2): 650 mg via ORAL
  Filled 2015-03-19 (×2): qty 2

## 2015-03-19 MED ORDER — QUETIAPINE FUMARATE 200 MG PO TABS
200.0000 mg | ORAL_TABLET | Freq: Every evening | ORAL | Status: DC | PRN
  Administered 2015-03-19: 200 mg via ORAL
  Filled 2015-03-19: qty 1

## 2015-03-19 MED ORDER — FLUPHENAZINE HCL 5 MG PO TABS
5.0000 mg | ORAL_TABLET | Freq: Two times a day (BID) | ORAL | Status: DC
  Administered 2015-03-19: 5 mg via ORAL
  Filled 2015-03-19 (×2): qty 1

## 2015-03-19 MED ORDER — INSULIN ASPART 100 UNIT/ML ~~LOC~~ SOLN
0.0000 [IU] | Freq: Three times a day (TID) | SUBCUTANEOUS | Status: DC
  Administered 2015-03-20: 5 [IU] via SUBCUTANEOUS
  Administered 2015-03-20: 9 [IU] via SUBCUTANEOUS
  Administered 2015-03-20: 3 [IU] via SUBCUTANEOUS
  Administered 2015-03-21: 2 [IU] via SUBCUTANEOUS
  Administered 2015-03-21: 3 [IU] via SUBCUTANEOUS
  Filled 2015-03-19: qty 1

## 2015-03-19 NOTE — BH Assessment (Signed)
Tele Assessment Note   [name] is an 29 y.o. male.  -Clinician reviewed note by Dr. Effie Shy.  Pt was accompanied by law enforcement.  Pt uncooperative.  Dr. Effie Shy placed pt under commitment.  Patient answered a few of clinical questions then fell asleep and would not awaken.  Patient said that EMS brought him to [facility] from his home but he cannot say why.  Patient did say no to SI.  Fell asleep before could answer interview questions..  Pt did say he drank but could not say how much.     Pt denies SI & HI in previous documentation.  When asked about A/V hallucinations he does not answer.  Pt did not participate in interview.  He needs to be assessed in the AM on 04/01.   -Patient needs to be seen by psychiatry due to his inability to remain awake for assessment.  Axis I: Schizoaffective Disorder Axis II: Deferred Axis III:  Past Medical History  Diagnosis Date  . Diabetes mellitus   . Depression   . Schizo affective schizophrenia   . Polysubstance abuse   . Scoliosis   . Chronic pain   . Noncompliance with medication regimen   . DKA (diabetic ketoacidoses) 11/18/2014  . GERD (gastroesophageal reflux disease)   . Arthritis     "both hips" (12/21/2014)   Axis IV: economic problems, housing problems, occupational problems, other psychosocial or environmental problems and problems related to social environment Axis V: 21-30 behavior considerably influenced by delusions or hallucinations OR serious impairment in judgment, communication OR inability to function in almost all areas  Past Medical History:  Past Medical History  Diagnosis Date  . Diabetes mellitus   . Depression   . Schizo affective schizophrenia   . Polysubstance abuse   . Scoliosis   . Chronic pain   . Noncompliance with medication regimen   . DKA (diabetic ketoacidoses) 11/18/2014  . GERD (gastroesophageal reflux disease)   . Arthritis     "both hips" (12/21/2014)    History reviewed. No pertinent past  surgical history.  Family History:  Family History  Problem Relation Age of Onset  . Diabetes Mother     Social History:  reports that he has been smoking Cigarettes.  He has a 7 pack-year smoking history. He has never used smokeless tobacco. He reports that he drinks alcohol. He reports that he uses illicit drugs (Marijuana).  Additional Social History:  Alcohol / Drug Use Pain Medications: No medications Prescriptions: No medications Over the Counter: Unknown History of alcohol / drug use?: Yes Substance #1 Name of Substance 1: ETOH  1 - Age of First Use: Teens 1 - Amount (size/oz): Varies 1 - Frequency: Pt could not tell 1 - Duration: On-going 1 - Last Use / Amount: Pt cannot answer.  CIWA: CIWA-Ar BP: 109/74 mmHg Pulse Rate: 87 COWS:    PATIENT STRENGTHS: (choose at least two) Average or above average intelligence Capable of independent living  Allergies:  Allergies  Allergen Reactions  . Sulfa Antibiotics Other (See Comments)    Unknown - Childhood    Home Medications:  (Not in a hospital admission)  OB/GYN Status:  No LMP for male patient.  General Assessment Data Location of Assessment: [facility] ED Is this a Tele or Face-to-Face Assessment?: Tele Assessment Is this an Initial Assessment or a Re-assessment for this encounter?: Initial Assessment Living Arrangements: Alone Can pt return to current living arrangement?: Yes Admission Status: Involuntary (Physician initiated.) Is patient capable of  signing voluntary admission?: No Transfer from: Acute Hospital Referral Source: Self/Family/Friend     [facility] Crisis Care Plan Living Arrangements: Alone Name of Psychiatrist: None Name of Therapist: None     Risk to self with the past 6 months Suicidal Ideation: No Suicidal Intent: No Is patient at risk for suicide?: No Suicidal Plan?: No Access to Means: No What has been your use of drugs/alcohol within the last 12 months?: ETOH use  Previous  Attempts/Gestures:  (Unable to assess.) How many times?:  (Unknown) Other Self Harm Risks: N/A Triggers for Past Attempts: None known Intentional Self Injurious Behavior: None Family Suicide History: Unknown Recent stressful life event(s): Other (Comment) (Unknown) Persecutory voices/beliefs?:  (Unable to assess) Depression:  (Unable to assess) Depression Symptoms:  (Unable to assess) Substance abuse history and/or treatment for substance abuse?:  (Unable to assess) Suicide prevention information given to non-admitted patients: Not applicable  Risk to Others within the past 6 months Homicidal Ideation: No Thoughts of Harm to Others: No Current Homicidal Intent: No Current Homicidal Plan: No Access to Homicidal Means: No Identified Victim: No one History of harm to others?: No Assessment of Violence: None Noted Violent Behavior Description: Pt calm and sleepy Does patient have access to weapons?:  (Unknown) Criminal Charges Pending?:  (Unknown) Does patient have a court date:  (Unknown)  Psychosis Hallucinations: Auditory (Pt displaying psychotic behavior) Delusions:  (Pt displaying psychotic behavior)  Mental Status Report Appearance/Hygiene: Disheveled, In scrubs Eye Contact: Poor Motor Activity: Freedom of movement, Unremarkable Speech: Soft, Slurred Level of Consciousness: Sleeping Mood: Other (Comment) (Unable to assess) Affect: Unable to Assess Anxiety Level:  (Unable to assess.) Thought Processes: Unable to Assess Judgement: Unable to Assess Orientation: Unable to assess Obsessive Compulsive Thoughts/Behaviors: Unable to Assess  Cognitive Functioning Concentration: Unable to Assess Memory: Unable to Assess IQ: Average Impulse Control: Unable to Assess Appetite:  (Unable to assess) Weight Loss: 0 Weight Gain: 0 Sleep: Unable to Assess Total Hours of Sleep:  (Unknown.  Sleepy now however.) Vegetative Symptoms: Unable to Assess  ADLScreening [facility] Assessment  Services) Patient able to express need for assistance with ADLs?: Yes Independently performs ADLs?: Yes (appropriate for developmental age)  Prior Inpatient Therapy Prior Inpatient Therapy: Yes Prior Therapy Dates: March 2013 Prior Therapy Facilty/Provider(s): [facility] Reason for Treatment: psychotic behavior  Prior Outpatient Therapy Prior Outpatient Therapy: Yes Prior Therapy Dates: Unknown Prior Therapy Facilty/Provider(s): Unknown Reason for Treatment: Unknown  ADL Screening (condition at time of admission) Is the patient deaf or have difficulty hearing?: No Does the patient have difficulty seeing, even when wearing glasses/contacts?: No Does the patient have difficulty concentrating, remembering, or making decisions?: Yes Patient able to express need for assistance with ADLs?: Yes Does the patient have difficulty dressing or bathing?: No Independently performs ADLs?: Yes (appropriate for developmental age) Does the patient have difficulty walking or climbing stairs?: No Weakness of Legs: None Weakness of Arms/Hands: None       Abuse/Neglect Assessment (Assessment to be complete while patient is alone) Physical Abuse:  (unable to assess, pt sleeping.) Verbal Abuse:  (Unable to assess, pt sleeping) Sexual Abuse:  (Unable to assess, pt sleeping) Exploitation of patient/patient's resources: Denies Self-Neglect: Denies     Merchant navy officer (For Healthcare) Does patient have an advance directive?: No Would patient like information on creating an advanced directive?: No - patient declined information    Additional Information 1:1 In Past 12 Months?: No CIRT Risk: No Elopement Risk: No Does patient have medical clearance?: Yes  Disposition:  Disposition Initial Assessment Completed for this Encounter: Yes Disposition of Patient: Other dispositions Other disposition(s): Other (Comment) (To be seen by psychiatry in AM on 04/01)  Alexandria Lodge 03/19/2015 3:57  AM

## 2015-03-19 NOTE — ED Notes (Signed)
CBG 161 

## 2015-03-19 NOTE — BH Assessment (Signed)
[facility] Assessment Progress Note  Per Carolanne Grumbling, MD this pt requires psychiatric hospitalization at this time.  Berneice Heinrich, RN, [facility] has assigned pt to Rm [id number].  Due to pt's state of mind, he is currently incapable of signing Consent to Release Information, but he reports that he does not have an outpatient provider at this time.  IVC paperwork has been faxed to [facility].  Pt's nurse, Carlisle Beers, has been notified.  Pt will be transported via [employer].  Doylene Canning, MA Triage Specialist 03/19/2015 @ 14:18

## 2015-03-19 NOTE — Tx Team (Signed)
Initial Interdisciplinary Treatment Plan   PATIENT STRESSORS: "I have no stressers"   PATIENT STRENGTHS: Active sense of humor Communication skills   PROBLEM LIST: Problem List/Patient Goals Date to be addressed Date deferred Reason deferred Estimated date of resolution  "Medication Management" 03-19-15     Delusional  03-19-15                                                DISCHARGE CRITERIA:  Ability to meet basic life and health needs Improved stabilization in mood, thinking, and/or behavior  PRELIMINARY DISCHARGE PLAN: Attend aftercare/continuing care group Return to previous living arrangement  PATIENT/FAMIILY INVOLVEMENT: This treatment plan has been presented to and reviewed with the patient, [name], and/or family member.  The patient and family have been given the opportunity to ask questions and make suggestions.  [name] 03/19/2015, 7:00 PM

## 2015-03-19 NOTE — ED Notes (Signed)
Patient drowsy; minimal communication, cooperative. Denies SI, HI. Denies feelings of anxiety and depression.   Encouragement offered. Oriented to the unit. Snack per request.  Q 15 safety checks in place.

## 2015-03-19 NOTE — Progress Notes (Signed)
Pt states he has healthteam advantage coverage and a pcp Unable to recall pcp name.   CM asked registration staff in ED to check/rerun pt coverage

## 2015-03-19 NOTE — Progress Notes (Signed)
Adult Psychoeducational Group Note  Date:  03/19/2015 Time:  8:34 PM  Group Topic/Focus:  Wrap-Up Group:   The focus of this group is to help patients review their daily goal of treatment and discuss progress on daily workbooks.  Participation Level:  Active  Participation Quality:  Appropriate  Affect:  Appropriate  Cognitive:  Appropriate  Insight: Appropriate  Engagement in Group:  Engaged  Modes of Intervention:  Discussion  Additional Comments:  Staff discussed rules here at [facility].  Octavio Manns 03/19/2015, 8:34 PM

## 2015-03-19 NOTE — Progress Notes (Signed)
Pt neighbor called the cops due to him displaying bizarre behaviors. Pt denies SI/HI/AVH. Pt is responding to internal stimuli. Pt still very delusional, has disorganized thoughts, and is preoccupied with male staff and patients. Pt says he "is not here for help but is here only to help other pts and see pretty girls". Pt also stated that "if he does not see a doctor and satan tells him to do something that all of our rules here will no longer apply to him". Pt was redirected and was informed that he would see a doctor but it would be tomorrow. Pt seems to be cooperative at times but needs to be redirected often.

## 2015-03-19 NOTE — Progress Notes (Signed)
Patient ID: [name], male   DOB: 1986-02-05, 29 y.o.   MRN: [id number] D: Client in hall cursing, "I need something for anxiety and that vistaril don't work, I need Ativan." Client say he is not a diabetic,"that's not true, I can take insulin and put it in my veins and it won't do nothing, don't believe all that stuff about A1C and all that" Client is loud, tangential, and labile. A: Writer introduced self to client, provided emotional support, staff with NP and received orders for medication. Received orders for Seroquel and administered orally. Staff will monitor q56min for safetty. R: Client is safe on the unit, attended group.

## 2015-03-19 NOTE — Consult Note (Signed)
[facility] Consult   Reason for Consult:  Schizoaffective disorder, Schizophrenia, Anxiety disorder Referring Physician:  EDP Patient Identification: [name] MRN:  [id number] Principal Diagnosis: Schizophrenia, undifferentiated Diagnosis:   Patient Active Problem List   Diagnosis Date Noted  . Schizophrenia, undifferentiated [F20.3] 02/18/2012    Priority: High    Class: Acute  . HCAP (healthcare-associated pneumonia) [J18.9] 01/23/2015  . Hyperglycemia [R73.9] 12/21/2014  . Diabetic ketoacidosis without coma associated with diabetes mellitus due to underlying condition [E08.10]   . Involuntary commitment [Z04.6] 11/19/2014  . DKA (diabetic ketoacidoses) [E13.10] 11/18/2014  . Hip pain, bilateral [M25.551, M25.552] 11/18/2014  . DKA, type 1 [E10.10] 06/17/2012  . Hyponatremia [E87.1] 06/17/2012  . Dehydration [E86.0] 06/17/2012  . Generalized anxiety disorder [F41.1] 02/19/2012  . Diabetes type 1, uncontrolled [E10.65] 02/19/2012    Total Time spent with patient: 1 hour  Subjective:   [name] is a 29 y.o. male patient admitted with .Schizoaffective disorder, Schizophrenia, Anxiety disorder  HPI:  Caucasian male, 29 years old was evaluated this morning after he was brought in by the Police under IVC from his apartment for Bizarre behavior.  Today, patient was seen in his room awake, he could not say why he was brought in and what kind of need he has.  He denied any MH illness and that stated that his medical issue is "broken heart" but did not elaborate.   He has a hx of DM and is on Insulin.   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   He stated that he is disabled but could not explain his disability.  He stated that he does not go out of his Apartment to avoid putting people in trouble.  He admitted to visual and auditory hallucinations seeing bugs crawling around his apartment but finds out nothing was there.   He reports hearing  voices from the "magical land" but could not explain what the voices are saying to him.  He was last hospitalized at our [facility] 2013 for exacerbation of Schizophrenia and Schizoaffective disorder and anxiety.  Patient denies SI/HI and he has been accepted for admission and will be transferred to any available inpatient Psychiatric unit.  We will resume his home medications while we wait for bed.  HPI Elements:   Location:  Pshychosis, Schizoaffective disorder, unspecified, Schizophrenia. Quality:  severe. Severity:  severe. Timing:  Acute. Duration:  Chronic mental illness. Context:  IVC for Bizarre behavior and for treatment..  Past Medical History:  Past Medical History  Diagnosis Date  . Diabetes mellitus   . Depression   . Schizo affective schizophrenia   . Polysubstance abuse   . Scoliosis   . Chronic pain   . Noncompliance with medication regimen   . DKA (diabetic ketoacidoses) 11/18/2014  . GERD (gastroesophageal reflux disease)   . Arthritis     "both hips" (12/21/2014)   History reviewed. No pertinent past surgical history. Family History:  Family History  Problem Relation Age of Onset  . Diabetes Mother    Social History:  History  Alcohol Use  . Yes    Comment: 12/21/2014 "I drink on a rare occasion"     History  Drug Use  . Yes  . Special: Marijuana    Comment: 12/21/2014 "I use marijuana on a rare occasion", last used marijuana 01/30/2015    History   Social History  . Marital Status: Married    Spouse Name: N/A  . Number of  Children: N/A  . Years of Education: N/A   Social History Main Topics  . Smoking status: Current Some Day Smoker -- 1.00 packs/day for 7 years    Types: Cigarettes  . Smokeless tobacco: Never Used  . Alcohol Use: Yes     Comment: 12/21/2014 "I drink on a rare occasion"  . Drug Use: Yes    Special: Marijuana     Comment: 12/21/2014 "I use marijuana on a rare occasion", last used marijuana 01/30/2015  . Sexual Activity: Yes   Other Topics  Concern  . None   Social History Narrative   ** Merged History Encounter **       Additional Social History:    Pain Medications: No medications Prescriptions: No medications Over the Counter: Unknown History of alcohol / drug use?: Yes Name of Substance 1: ETOH  1 - Age of First Use: Teens 1 - Amount (size/oz): Varies 1 - Frequency: Pt could not tell 1 - Duration: On-going 1 - Last Use / Amount: Pt cannot answer.    Allergies:   Allergies  Allergen Reactions  . Sulfa Antibiotics Other (See Comments)    Unknown - Childhood    Labs:  Results for orders placed or performed during the hospital encounter of 03/18/15 (from the past 48 hour(s))  Acetaminophen level     Status: Abnormal   Collection Time: 03/18/15  6:36 PM  Result Value Ref Range   Acetaminophen (Tylenol), Serum <10.0 (L) 10 - 30 ug/mL    Comment:        THERAPEUTIC CONCENTRATIONS VARY SIGNIFICANTLY. A RANGE OF 10-30 ug/mL MAY BE AN EFFECTIVE CONCENTRATION FOR MANY PATIENTS. HOWEVER, SOME ARE BEST TREATED AT CONCENTRATIONS OUTSIDE THIS RANGE. ACETAMINOPHEN CONCENTRATIONS >150 ug/mL AT 4 HOURS AFTER INGESTION AND >50 ug/mL AT 12 HOURS AFTER INGESTION ARE OFTEN ASSOCIATED WITH TOXIC REACTIONS.   CBC     Status: None   Collection Time: 03/18/15  6:36 PM  Result Value Ref Range   WBC 7.6 4.0 - 10.5 K/uL   RBC 4.98 4.22 - 5.81 MIL/uL   Hemoglobin 14.4 13.0 - 17.0 g/dL   HCT 43.1 39.0 - 52.0 %   MCV 86.5 78.0 - 100.0 fL   MCH 28.9 26.0 - 34.0 pg   MCHC 33.4 30.0 - 36.0 g/dL   RDW 12.9 11.5 - 15.5 %   Platelets 232 150 - 400 K/uL  Comprehensive metabolic panel     Status: Abnormal   Collection Time: 03/18/15  6:36 PM  Result Value Ref Range   Sodium 135 135 - 145 mmol/L   Potassium 4.4 3.5 - 5.1 mmol/L   Chloride 94 (L) 96 - 112 mmol/L   CO2 26 19 - 32 mmol/L   Glucose, Bld 452 (H) 70 - 99 mg/dL   BUN 16 6 - 23 mg/dL   Creatinine, Ser 0.70 0.50 - 1.35 mg/dL   Calcium 9.6 8.4 - 10.5 mg/dL    Total Protein 7.0 6.0 - 8.3 g/dL   Albumin 4.5 3.5 - 5.2 g/dL   AST 23 0 - 37 U/L   ALT 19 0 - 53 U/L   Alkaline Phosphatase 86 39 - 117 U/L   Total Bilirubin 0.8 0.3 - 1.2 mg/dL   GFR calc non Af Amer >90 >90 mL/min   GFR calc Af Amer >90 >90 mL/min    Comment: (NOTE) The eGFR has been calculated using the CKD EPI equation. This calculation has not been validated in all clinical situations. eGFR's persistently <90  mL/min signify possible Chronic Kidney Disease.    Anion gap 15 5 - 15  Ethanol (ETOH)     Status: None   Collection Time: 03/18/15  6:36 PM  Result Value Ref Range   Alcohol, Ethyl (B) <5 0 - 9 mg/dL    Comment:        LOWEST DETECTABLE LIMIT FOR SERUM ALCOHOL IS 11 mg/dL FOR MEDICAL PURPOSES ONLY   Salicylate level     Status: None   Collection Time: 03/18/15  6:36 PM  Result Value Ref Range   Salicylate Lvl <9.1 2.8 - 20.0 mg/dL  Urine Drug Screen     Status: None   Collection Time: 03/18/15  7:20 PM  Result Value Ref Range   Opiates NONE DETECTED NONE DETECTED   Cocaine NONE DETECTED NONE DETECTED   Benzodiazepines NONE DETECTED NONE DETECTED   Amphetamines NONE DETECTED NONE DETECTED   Tetrahydrocannabinol NONE DETECTED NONE DETECTED   Barbiturates NONE DETECTED NONE DETECTED    Comment:        DRUG SCREEN FOR MEDICAL PURPOSES ONLY.  IF CONFIRMATION IS NEEDED FOR ANY PURPOSE, NOTIFY LAB WITHIN 5 DAYS.        LOWEST DETECTABLE LIMITS FOR URINE DRUG SCREEN Drug Class       Cutoff (ng/mL) Amphetamine      1000 Barbiturate      200 Benzodiazepine   791 Tricyclics       505 Opiates          300 Cocaine          300 THC              50   Urinalysis, Routine w reflex microscopic     Status: Abnormal   Collection Time: 03/18/15  7:20 PM  Result Value Ref Range   Color, Urine YELLOW YELLOW   APPearance CLEAR CLEAR   Specific Gravity, Urine 1.043 (H) 1.005 - 1.030   pH 6.5 5.0 - 8.0   Glucose, UA >1000 (A) NEGATIVE mg/dL   Hgb urine dipstick  NEGATIVE NEGATIVE   Bilirubin Urine NEGATIVE NEGATIVE   Ketones, ur 40 (A) NEGATIVE mg/dL   Protein, ur NEGATIVE NEGATIVE mg/dL   Urobilinogen, UA 0.2 0.0 - 1.0 mg/dL   Nitrite NEGATIVE NEGATIVE   Leukocytes, UA NEGATIVE NEGATIVE  Urine microscopic-add on     Status: None   Collection Time: 03/18/15  7:20 PM  Result Value Ref Range   Squamous Epithelial / LPF RARE RARE   WBC, UA 0-2 <3 WBC/hpf   Bacteria, UA RARE RARE   Urine-Other MUCOUS PRESENT   CBG monitoring, ED     Status: Abnormal   Collection Time: 03/18/15 10:01 PM  Result Value Ref Range   Glucose-Capillary 118 (H) 70 - 99 mg/dL  CBG monitoring, ED     Status: Abnormal   Collection Time: 03/18/15 11:00 PM  Result Value Ref Range   Glucose-Capillary 122 (H) 70 - 99 mg/dL  CBG monitoring, ED     Status: Abnormal   Collection Time: 03/19/15  8:28 AM  Result Value Ref Range   Glucose-Capillary 284 (H) 70 - 99 mg/dL    Vitals: Blood pressure 124/90, pulse 104, temperature 98.6 F (37 C), temperature source Oral, resp. rate 16, SpO2 99 %.  Risk to Self: Suicidal Ideation: No Suicidal Intent: No Is patient at risk for suicide?: No Suicidal Plan?: No Access to Means: No What has been your use of drugs/alcohol within the last 12 months?: ETOH  use  How many times?:  (Unknown) Other Self Harm Risks: N/A Triggers for Past Attempts: None known Intentional Self Injurious Behavior: None Risk to Others: Homicidal Ideation: No Thoughts of Harm to Others: No Current Homicidal Intent: No Current Homicidal Plan: No Access to Homicidal Means: No Identified Victim: No one History of harm to others?: No Assessment of Violence: None Noted Violent Behavior Description: Pt calm and sleepy Does patient have access to weapons?:  (Unknown) Criminal Charges Pending?:  (Unknown) Does patient have a court date:  (Unknown) Prior Inpatient Therapy: Prior Inpatient Therapy: Yes Prior Therapy Dates: March 2013 Prior Therapy  Facilty/Provider(s): [facility] Reason for Treatment: psychotic behavior Prior Outpatient Therapy: Prior Outpatient Therapy: Yes Prior Therapy Dates: Unknown Prior Therapy Facilty/Provider(s): Unknown Reason for Treatment: Unknown  Current Facility-Administered Medications  Medication Dose Route Frequency Provider Last Rate Last Dose  . acetaminophen (TYLENOL) tablet 650 mg  650 mg Oral Q4H PRN Mercedes Camprubi-Soms, PA-C      . alum & mag hydroxide-simeth (MAALOX/MYLANTA) 200-200-20 MG/5ML suspension 30 mL  30 mL Oral PRN Mercedes Camprubi-Soms, PA-C      . doxepin (SINEQUAN) capsule 100 mg  100 mg Oral QHS Mercedes Camprubi-Soms, PA-C   100 mg at 03/18/15 2345  . fluticasone (FLONASE) 50 MCG/ACT nasal spray 2 spray  2 spray Each Nare Daily Mercedes Camprubi-Soms, PA-C   2 spray at 03/19/15 0929  . ibuprofen (ADVIL,MOTRIN) tablet 600 mg  600 mg Oral Q8H PRN Mercedes Camprubi-Soms, PA-C      . insulin aspart (novoLOG) injection 0-9 Units  0-9 Units Subcutaneous TID WC Mercedes Camprubi-Soms, PA-C      . insulin detemir (LEVEMIR) injection 35 Units  35 Units Subcutaneous Daily Mercedes Camprubi-Soms, PA-C   35 Units at 03/19/15 0929  . LORazepam (ATIVAN) tablet 1 mg  1 mg Oral Q8H PRN Mercedes Camprubi-Soms, PA-C      . ondansetron (ZOFRAN) tablet 4 mg  4 mg Oral Q8H PRN Mercedes Camprubi-Soms, PA-C       Current Outpatient Prescriptions  Medication Sig Dispense Refill  . amphetamine-dextroamphetamine (ADDERALL) 20 MG tablet Take 20 mg by mouth daily.    [name] acetaminophen-codeine (TYLENOL #3) 300-30 MG per tablet Take 1 tablet by mouth every 4 (four) hours as needed. (Patient not taking: Reported on 03/18/2015) 90 tablet 0  . amoxicillin (AMOXIL) 500 MG capsule Take 1 capsule (500 mg total) by mouth every 8 (eight) hours. (Patient not taking: Reported on 03/18/2015) 18 capsule 0  . doxepin (SINEQUAN) 100 MG capsule Take 1 capsule (100 mg total) by mouth at bedtime. (Patient not taking: Reported on  03/18/2015) 3 capsule 0  . fluticasone (FLONASE) 50 MCG/ACT nasal spray Place 2 sprays into both nostrils daily. (Patient not taking: Reported on 03/18/2015) 16 g 0  . glucose blood (FREESTYLE LITE) test strip Use as instructed (Patient not taking: Reported on 03/18/2015) 100 each 12  . glucose monitoring kit (FREESTYLE) monitoring kit 1 each by Does not apply route as needed for other. Test blood sugars twice daily and when needed (Patient not taking: Reported on 03/18/2015) 1 each 0  . insulin aspart (NOVOLOG) 100 UNIT/ML injection Inject 0-9 Units into the skin 3 (three) times daily with meals. CBG < 70: Eat or drink something sweet and recheck. CBG 70 - 120: 0 units CBG 121 - 150: 1 unit CBG 151 - 200: 2 units CBG 201 - 250: 3 units CBG 251 - 300: 5 units CBG 301 - 350: 7 units CBG 351 -  400: 9 units CBG > 400: call MD. (Patient not taking: Reported on 03/18/2015)    . insulin detemir (LEVEMIR) 100 UNIT/ML injection Inject 0.35 mLs (35 Units total) into the skin daily. (Patient not taking: Reported on 03/18/2015) 10 mL 11  . insulin detemir (LEVEMIR) 100 UNIT/ML injection Inject 0.35 mLs (35 Units total) into the skin daily. (Patient not taking: Reported on 03/18/2015) 10 mL 0  . INSULIN SYRINGE .5CC/29G 29G X 1/2" 0.5 ML MISC 1 vial by Does not apply route 2 (two) times daily. (Patient not taking: Reported on 03/18/2015) 50 each 0  . Insulin Syringe-Needle U-100 30G X 5/16" 0.5 ML MISC Test blood glucose 3 times daily (Patient not taking: Reported on 03/18/2015) 100 each 12  . Lancets (FREESTYLE) lancets Use as instructed (Patient not taking: Reported on 03/18/2015) 100 each 12  . [DISCONTINUED] citalopram (CELEXA) 20 MG tablet Take 20 mg by mouth daily.    . [DISCONTINUED] FLUoxetine (PROZAC) 20 MG tablet Take 20 mg by mouth every morning.    . [DISCONTINUED] gabapentin (NEURONTIN) 600 MG tablet Take 600 mg by mouth 3 (three) times daily.    . [DISCONTINUED] insulin glargine (LANTUS) 100 UNIT/ML  injection Inject 30 Units into the skin at bedtime. 10 mL 3  . [DISCONTINUED] insulin lispro (HUMALOG) 100 UNIT/ML injection Inject 10 Units into the skin 3 (three) times daily before meals.      . [DISCONTINUED] OLANZapine zydis (ZYPREXA) 10 MG disintegrating tablet Take 10 mg by mouth 2 (two) times daily.    . [DISCONTINUED] sertraline (ZOLOFT) 50 MG tablet Take 50 mg by mouth daily. For depression. Just started med, have not picked up yet [facility] [address]      Musculoskeletal: Strength & Muscle Tone: seen in beed and have been sleeping off and on Gait & Station: seen in bed Patient leans: see above  Psychiatric Specialty Exam:     Blood pressure 124/90, pulse 104, temperature 98.6 F (37 C), temperature source Oral, resp. rate 16, SpO2 99 %.There is no weight on file to calculate BMI.  General Appearance: Casual and Disheveled  Eye Contact::  Minimal  Speech:  Pressured  Volume:  Normal  Mood:  Euphoric  Affect:  Congruent and confused  Thought Process:  Circumstantial, Disorganized, Irrelevant, Loose and Tangential  Orientation:  Full (Time, Place, and Person)  Thought Content:  Hallucinations: Auditory Visual  Suicidal Thoughts:  No  Homicidal Thoughts:  No  Memory:  unable to remember and process well.  Judgement:  Impaired  Insight:  Shallow  Psychomotor Activity:  Psychomotor Retardation  Concentration:  Poor  Recall:  NA  Fund of Knowledge:Poor  Language: Poor  Akathisia:  NA  Handed:  Right  AIMS (if indicated):     Assets:  Desire for Improvement  ADL's:  Impaired  Cognition: Impaired,  Severe  Sleep:      Medical Decision Making: Established Problem, Worsening (2), Review of Medication Regimen & Side Effects (2) and Review of New Medication or Change in Dosage (2)  Treatment Plan Summary: Daily contact with patient to assess and evaluate symptoms and progress in treatment, Medication management and Plan for admission  Plan:  Recommend psychiatric  Inpatient admission when medically cleared. Disposition: see  above  Delfin Gant   PMHNP-[facility] 03/19/2015 11:38 AM

## 2015-03-20 ENCOUNTER — Encounter (HOSPITAL_COMMUNITY): Payer: Self-pay | Admitting: Psychiatry

## 2015-03-20 DIAGNOSIS — E1065 Type 1 diabetes mellitus with hyperglycemia: Secondary | ICD-10-CM

## 2015-03-20 DIAGNOSIS — F209 Schizophrenia, unspecified: Principal | ICD-10-CM

## 2015-03-20 LAB — URINALYSIS, ROUTINE W REFLEX MICROSCOPIC
Bilirubin Urine: NEGATIVE
Glucose, UA: >1000 mg/dL — AB
Hgb urine dipstick: NEGATIVE
Ketones, ur: NEGATIVE mg/dL
Leukocytes, UA: NEGATIVE
Nitrite: NEGATIVE
Protein, ur: NEGATIVE mg/dL
Specific Gravity, Urine: 1.037 — ABNORMAL HIGH (ref 1.005–1.030)
Urobilinogen, UA: 0.2 mg/dL (ref 0.0–1.0)
pH: 5.5 (ref 5.0–8.0)

## 2015-03-20 LAB — BASIC METABOLIC PANEL
Anion gap: 9 (ref 5–15)
BUN: 23 mg/dL (ref 6–23)
CO2: 26 mmol/L (ref 19–32)
Calcium: 8.7 mg/dL (ref 8.4–10.5)
Chloride: 101 mmol/L (ref 96–112)
Creatinine, Ser: 0.99 mg/dL (ref 0.50–1.35)
GFR calc Af Amer: >90 mL/min (ref >90)
GFR calc non Af Amer: >90 mL/min (ref >90)
Glucose, Bld: 379 mg/dL — ABNORMAL HIGH (ref 70–99)
Potassium: 4.5 mmol/L (ref 3.5–5.1)
Sodium: 136 mmol/L (ref 135–145)

## 2015-03-20 LAB — COMPREHENSIVE METABOLIC PANEL
ALBUMIN: 3.5 g/dL (ref 3.5–5.2)
ALT: 15 U/L (ref 0–53)
AST: 14 U/L (ref 0–37)
Alkaline Phosphatase: 65 U/L (ref 39–117)
Anion gap: 12 (ref 5–15)
BUN: 22 mg/dL (ref 6–23)
CO2: 27 mmol/L (ref 19–32)
Calcium: 8.4 mg/dL (ref 8.4–10.5)
Chloride: 99 mmol/L (ref 96–112)
Creatinine, Ser: 0.64 mg/dL (ref 0.50–1.35)
GFR calc Af Amer: >90 mL/min (ref >90)
GLUCOSE: 274 mg/dL — AB (ref 70–99)
Potassium: 3.7 mmol/L (ref 3.5–5.1)
Sodium: 138 mmol/L (ref 135–145)
Total Bilirubin: <0.1 mg/dL — ABNORMAL LOW (ref 0.3–1.2)
Total Protein: 5.6 g/dL — ABNORMAL LOW (ref 6.0–8.3)

## 2015-03-20 LAB — GLUCOSE, CAPILLARY
GLUCOSE-CAPILLARY: 522 mg/dL — AB (ref 70–99)
Glucose-Capillary: 212 mg/dL — ABNORMAL HIGH (ref 70–99)
Glucose-Capillary: 253 mg/dL — ABNORMAL HIGH (ref 70–99)
Glucose-Capillary: 318 mg/dL — ABNORMAL HIGH (ref 70–99)
Glucose-Capillary: 468 mg/dL — ABNORMAL HIGH (ref 70–99)
Glucose-Capillary: 510 mg/dL — ABNORMAL HIGH (ref 70–99)

## 2015-03-20 LAB — CBG MONITORING, ED
Glucose-Capillary: 427 mg/dL — ABNORMAL HIGH (ref 70–99)
Glucose-Capillary: 207 mg/dL — ABNORMAL HIGH (ref 70–99)

## 2015-03-20 LAB — URINE MICROSCOPIC-ADD ON

## 2015-03-20 MED ORDER — INSULIN ASPART 100 UNIT/ML ~~LOC~~ SOLN
5.0000 [IU] | SUBCUTANEOUS | Status: AC
  Administered 2015-03-20: 5 [IU] via SUBCUTANEOUS

## 2015-03-20 MED ORDER — OLANZAPINE 5 MG PO TBDP
5.0000 mg | ORAL_TABLET | Freq: Three times a day (TID) | ORAL | Status: DC | PRN
  Administered 2015-03-21 – 2015-03-22 (×5): 5 mg via ORAL
  Filled 2015-03-20 (×7): qty 1

## 2015-03-20 MED ORDER — SODIUM CHLORIDE 0.9 % IV BOLUS (SEPSIS)
2000.0000 mL | Freq: Once | INTRAVENOUS | Status: AC
  Administered 2015-03-20: 2000 mL via INTRAVENOUS

## 2015-03-20 MED ORDER — LORAZEPAM 1 MG PO TABS
ORAL_TABLET | ORAL | Status: AC
  Administered 2015-03-20: 12:00:00
  Filled 2015-03-20: qty 1

## 2015-03-20 MED ORDER — ZIPRASIDONE MESYLATE 20 MG IM SOLR
20.0000 mg | INTRAMUSCULAR | Status: AC | PRN
  Administered 2015-03-20: 20 mg via INTRAMUSCULAR
  Filled 2015-03-20: qty 20

## 2015-03-20 MED ORDER — INSULIN ASPART 100 UNIT/ML ~~LOC~~ SOLN
5.0000 [IU] | Freq: Once | SUBCUTANEOUS | Status: AC
  Administered 2015-03-20: 5 [IU] via SUBCUTANEOUS

## 2015-03-20 MED ORDER — RISPERIDONE 1 MG PO TBDP
1.0000 mg | ORAL_TABLET | Freq: Two times a day (BID) | ORAL | Status: DC
  Administered 2015-03-20 – 2015-03-23 (×5): 1 mg via ORAL
  Filled 2015-03-20 (×8): qty 1

## 2015-03-20 MED ORDER — DOUBLE ANTIBIOTIC 500-10000 UNIT/GM EX OINT
TOPICAL_OINTMENT | Freq: Two times a day (BID) | CUTANEOUS | Status: DC
  Filled 2015-03-20 (×23): qty 1

## 2015-03-20 MED ORDER — INSULIN DETEMIR 100 UNIT/ML ~~LOC~~ SOLN
40.0000 [IU] | Freq: Every day | SUBCUTANEOUS | Status: DC
  Administered 2015-03-20 – 2015-03-21 (×2): 40 [IU] via SUBCUTANEOUS
  Filled 2015-03-20: qty 0.4

## 2015-03-20 MED ORDER — INSULIN ASPART 100 UNIT/ML ~~LOC~~ SOLN
10.0000 [IU] | Freq: Once | SUBCUTANEOUS | Status: AC
  Administered 2015-03-20: 10 [IU] via SUBCUTANEOUS

## 2015-03-20 MED ORDER — BENZTROPINE MESYLATE 0.5 MG PO TABS
0.5000 mg | ORAL_TABLET | Freq: Two times a day (BID) | ORAL | Status: DC
  Administered 2015-03-20 – 2015-03-26 (×11): 0.5 mg via ORAL
  Filled 2015-03-20 (×6): qty 1
  Filled 2015-03-20: qty 14
  Filled 2015-03-20 (×7): qty 1
  Filled 2015-03-20: qty 14
  Filled 2015-03-20: qty 1
  Filled 2015-03-20: qty 14
  Filled 2015-03-20: qty 1
  Filled 2015-03-20: qty 14

## 2015-03-20 MED ORDER — LORAZEPAM 1 MG PO TABS
1.0000 mg | ORAL_TABLET | ORAL | Status: AC | PRN
  Administered 2015-03-21: 1 mg via ORAL
  Filled 2015-03-20: qty 1

## 2015-03-20 MED ORDER — DIVALPROEX SODIUM 500 MG PO DR TAB
500.0000 mg | DELAYED_RELEASE_TABLET | Freq: Two times a day (BID) | ORAL | Status: DC
  Administered 2015-03-20 – 2015-03-24 (×9): 500 mg via ORAL
  Filled 2015-03-20 (×11): qty 1

## 2015-03-20 MED ORDER — LORAZEPAM 1 MG PO TABS
1.0000 mg | ORAL_TABLET | Freq: Four times a day (QID) | ORAL | Status: DC | PRN
  Administered 2015-03-20 – 2015-03-22 (×7): 1 mg via ORAL
  Filled 2015-03-20 (×2): qty 1
  Filled 2015-03-20: qty 2
  Filled 2015-03-20 (×5): qty 1

## 2015-03-20 MED ORDER — STERILE WATER FOR INJECTION IJ SOLN
INTRAMUSCULAR | Status: AC
  Administered 2015-03-20: 1.2 mL
  Filled 2015-03-20: qty 10

## 2015-03-20 MED ORDER — INSULIN ASPART 100 UNIT/ML ~~LOC~~ SOLN
5.0000 [IU] | Freq: Three times a day (TID) | SUBCUTANEOUS | Status: DC
  Administered 2015-03-21 – 2015-03-22 (×4): 5 [IU] via SUBCUTANEOUS
  Filled 2015-03-20: qty 1

## 2015-03-20 NOTE — ED Provider Notes (Signed)
CSN: [id number]     Arrival date & time 03/20/15  1221 History   First MD Initiated Contact with Patient 03/20/15 1223     Chief Complaint  Patient presents with  . Hyperglycemia     (Consider location/radiation/quality/duration/timing/severity/associated sxs/prior Treatment) HPI   28yM with hyperglycemia. Sent from [facility] for further management. Pt currently manic/desluional. Doesn't think his hyperglycemia or diabetes is real. He is agreeable to treatment though. On levemir 35u daily and SSI. Recent A1C 12. Previous admission for hyperglycemia and DKA. Denies polyuria/polydipsia.   Past Medical History  Diagnosis Date  . Diabetes mellitus   . Depression   . Schizo affective schizophrenia   . Polysubstance abuse   . Scoliosis   . Chronic pain   . Noncompliance with medication regimen   . DKA (diabetic ketoacidoses) 11/18/2014  . GERD (gastroesophageal reflux disease)   . Arthritis     "both hips" (12/21/2014)   History reviewed. No pertinent past surgical history. Family History  Problem Relation Age of Onset  . Diabetes Mother    History  Substance Use Topics  . Smoking status: Current Some Day Smoker -- 1.00 packs/day for 7 years    Types: Cigarettes  . Smokeless tobacco: Never Used  . Alcohol Use: Yes     Comment: 12/21/2014 "I drink on a rare occasion"    Review of Systems  Level 5 caveat because pt is psychotic.   Allergies  Sulfa antibiotics  Home Medications   Prior to Admission medications   Medication Sig Start Date End Date Taking? Authorizing Provider  acetaminophen-codeine (TYLENOL #3) 300-30 MG per tablet  01/12/15   Historical Provider, MD  amphetamine-dextroamphetamine (ADDERALL) 20 MG tablet Take 20 mg by mouth 2 (two) times daily. 02/25/15   Historical Provider, MD  Blood Glucose Monitoring Suppl (FREESTYLE LITE) DEVI See admin instructions. 01/12/15   Historical Provider, MD  doxepin (SINEQUAN) 100 MG capsule Take 1 capsule (100 mg total) by mouth at  bedtime. Patient not taking: Reported on 03/18/2015 02/05/15   Audelia Hives Presson, PA  fluticasone (FLONASE) 50 MCG/ACT nasal spray Place 2 sprays into both nostrils daily. Patient not taking: Reported on 03/18/2015 12/30/14   Harden Mo, MD  glucose blood (FREESTYLE LITE) test strip Use as instructed Patient not taking: Reported on 03/18/2015 12/24/14   Tresa Garter, MD  glucose monitoring kit (FREESTYLE) monitoring kit 1 each by Does not apply route as needed for other. Test blood sugars twice daily and when needed Patient not taking: Reported on 03/18/2015 01/12/15   Tresa Garter, MD  insulin aspart (NOVOLOG) 100 UNIT/ML injection Inject 0-9 Units into the skin 3 (three) times daily with meals. CBG < 70: Eat or drink something sweet and recheck. CBG 70 - 120: 0 units CBG 121 - 150: 1 unit CBG 151 - 200: 2 units CBG 201 - 250: 3 units CBG 251 - 300: 5 units CBG 301 - 350: 7 units CBG 351 - 400: 9 units CBG > 400: call MD. Patient not taking: Reported on 03/18/2015 02/02/15   Modena Jansky, MD  insulin detemir (LEVEMIR) 100 UNIT/ML injection Inject 0.35 mLs (35 Units total) into the skin daily. Patient not taking: Reported on 03/18/2015 12/22/14   Ripudeep Krystal Eaton, MD  insulin detemir (LEVEMIR) 100 UNIT/ML injection Inject 0.35 mLs (35 Units total) into the skin daily. Patient not taking: Reported on 03/18/2015 02/05/15   Lutricia Feil, PA  INSULIN SYRINGE .5CC/29G 29G X 1/2" 0.5  ML MISC 1 vial by Does not apply route 2 (two) times daily. Patient not taking: Reported on 03/18/2015 12/22/14   Waldon Merl, PA-C  Insulin Syringe-Needle U-100 30G X 5/16" 0.5 ML MISC Test blood glucose 3 times daily Patient not taking: Reported on 03/18/2015 12/24/14   Tresa Garter, MD  Lancets (FREESTYLE) lancets Use as instructed Patient not taking: Reported on 03/18/2015 12/24/14   Tresa Garter, MD   BP 127/84 mmHg  Pulse 103  Temp(Src) 97.7 F (36.5 C) (Oral)  Resp 22  Ht 6'  1" (1.854 m)  Wt 189 lb 9.5 oz (86 kg)  BMI 25.02 kg/m2  SpO2 100% Physical Exam  Constitutional: He appears well-developed and well-nourished. No distress.  HENT:  Head: Normocephalic and atraumatic.  Eyes: Conjunctivae are normal. Right eye exhibits no discharge. Left eye exhibits no discharge.  Neck: Neck supple.  Cardiovascular: Regular rhythm and normal heart sounds.  Exam reveals no gallop and no friction rub.   No murmur heard. Mild tachycardia  Pulmonary/Chest: Effort normal and breath sounds normal. No respiratory distress.  Abdominal: Soft. He exhibits no distension. There is no tenderness.  Musculoskeletal: He exhibits no edema or tenderness.  Neurological: He is alert.  Skin: Skin is warm and dry.  Psychiatric:  Very animated. Loud and pressured speech. Delusions of not having medical issues. Does not exhibit any insight into current condition.   Nursing note and vitals reviewed.   ED Course  Procedures (including critical care time) Labs Review Labs Reviewed  GLUCOSE, CAPILLARY - Abnormal; Notable for the following:    Glucose-Capillary 423 (*)    All other components within normal limits  GLUCOSE, CAPILLARY - Abnormal; Notable for the following:    Glucose-Capillary 318 (*)    All other components within normal limits  COMPREHENSIVE METABOLIC PANEL - Abnormal; Notable for the following:    Glucose, Bld 274 (*)    Total Protein 5.6 (*)    Total Bilirubin <0.1 (*)    All other components within normal limits  GLUCOSE, CAPILLARY - Abnormal; Notable for the following:    Glucose-Capillary 253 (*)    All other components within normal limits  GLUCOSE, CAPILLARY - Abnormal; Notable for the following:    Glucose-Capillary 468 (*)    All other components within normal limits  URINALYSIS, ROUTINE W REFLEX MICROSCOPIC - Abnormal; Notable for the following:    Specific Gravity, Urine 1.037 (*)    Glucose, UA >1000 (*)    All other components within normal limits   BASIC METABOLIC PANEL - Abnormal; Notable for the following:    Glucose, Bld 379 (*)    All other components within normal limits  CBG MONITORING, ED - Abnormal; Notable for the following:    Glucose-Capillary 427 (*)    All other components within normal limits  CBG MONITORING, ED - Abnormal; Notable for the following:    Glucose-Capillary 207 (*)    All other components within normal limits  URINE MICROSCOPIC-ADD ON  CBG MONITORING, ED    Imaging Review No results found.   EKG Interpretation None      MDM   Final diagnoses:  Hyperglycemia    29 year old male with hyperglycemia. Mental illness and issues with compliance previously. Is hyperglycemic, but he is not in DKA. No anion gap. Bicarb 27. No ketones on UA. He was treated with 2 L of IV fluids and IV insulin. His blood sugars are now in a more acceptable range. I feel that  he is appropriate for discharge back to [facility]. I feel he would benefit from medicine consultation for possible medication adjustments.     Virgel Manifold, MD 03/20/15 [phone]

## 2015-03-20 NOTE — ED Notes (Signed)
Security to bedside

## 2015-03-20 NOTE — ED Notes (Addendum)
Pt resting comfortably. Rise and fall of chest noted.  VSS. Second NS Liter almost complete. Sitter remains at bedside

## 2015-03-20 NOTE — ED Notes (Addendum)
CBG registered 207 on ED Glucometer.

## 2015-03-20 NOTE — ED Notes (Signed)
Pt ambulated with steady gait to restroom with sitter.

## 2015-03-20 NOTE — Progress Notes (Signed)
Orders received to give 10 units of Novolog. Given to pt. Pt was in the bathroom and said that he had diarrhea and some vomiting. Pt had one episode of vomiting at the beginning of the shift (2015) but pt stated that he put his finger down his throat and made himself vomit at that time because he felt overly full. Pt denies that he made himself vomit on the second episode at 2333. Pt denies any other symptoms. Currently resting in bed. Eyes closed, breathing even and unlabored. Will continue to encourage fluids. Will recheck BG in 1 hour post administration of Novolog. Q15 min safety checks maintained. Will continue to monitor pt.

## 2015-03-20 NOTE — ED Notes (Signed)
Pt resting comfortably with sitter at bedside. VSS. SPO2 100% on RA. Cardiac rhythm sinus tachy at 101.

## 2015-03-20 NOTE — Progress Notes (Addendum)
D Pt is seen OOB UAL at lunch time..he is walking around the hall and yells at this nurse" I want some gingerale...Marland Kitchenhey can you give me some ginger ale??? I want some ginger ale. This nurse tried to explain to patient that he  Probably couldn't have plain ginger ale and he became VERY agitated, he yelled " well youre fired....you are not my nurse anymore!"   A Pt's CBG at noon and he went to ED for IVF's. He returned to [facility] around 1530, with most recent cbg down to 207. Pt in NAD and alert.   R Safety in place.  Per report from ED, pt became very agitated while in ED and hew as given IM geodon, consequently, he returned to floor and has been sleeping. Dinner cbg was 212 and he received 5 units meal coverage plus 3 units sliding scale. He went to dining room , ate dinner, returned to his room and is back asleep by 1830. Took all dinner time meds as scheduled.
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
## 2015-03-20 NOTE — Consult Note (Signed)
[facility] Medical Consultation  [name] OIT:[id number] DOB: [birth year] DOA: 03/19/2015 PCP: No primary care provider on file.   Requesting physician: Dr. Posey Boyer.  Date of consultation: 03-20-2015 Reason for consultation: Management if Diabetes.   Impression/Recommendations Principal Problem:   Schizophrenia Active Problems:   Diabetes type 1, uncontrolled    1. Diabetes, type 1: Uncontrolled. No evidence of DKA. Will increase Lantus to 40 units daily. Will add 5 units meal coverage. Continue with SSI. Last Hb-A1c was at 12.0.    I will followup again tomorrow. Please contact me if I can be of assistance in the meanwhile. Thank you for this consultation.  Chief Complaint: Hight blood sugar.   HPI: 29 year old with PMH significant dor Diabetes type 1, prior admissions for DKA who was admitted 4-1 to  [facility] for management of maniac/delusional. He was seen in the ED today due to hyperglycemia. Labs were not consistent with DKA, gap was normal, Bicarb 27. He received insulin in the ED. He was sent back to [facility]. We were consulted to help with management of hyperglycemia.  Patient denies dyspnea, chest pain, no abdominal pain. He use 40 units of lantus at home, and SSI>   Review of Systems:  Negative, except as per HPI./   Past Medical History  Diagnosis Date  . Diabetes mellitus   . Depression   . Schizo affective schizophrenia   . Polysubstance abuse   . Scoliosis   . Chronic pain   . Noncompliance with medication regimen   . DKA (diabetic ketoacidoses) 11/18/2014  . GERD (gastroesophageal reflux disease)   . Arthritis     "both hips" (12/21/2014)   History reviewed. No pertinent past surgical history. Social History:  reports that he has been smoking Cigarettes.  He has a 7 pack-year smoking history. He has never used smokeless tobacco. He reports that he drinks alcohol. He reports that he uses illicit drugs (Marijuana).  Allergies  Allergen Reactions  . Sulfa  Antibiotics Other (See Comments)    Unknown - Childhood   Family History  Problem Relation Age of Onset  . Diabetes Mother     Prior to Admission medications   Medication Sig Start Date End Date Taking? Authorizing Provider  acetaminophen-codeine (TYLENOL #3) 300-30 MG per tablet  01/12/15   Historical Provider, MD  amphetamine-dextroamphetamine (ADDERALL) 20 MG tablet Take 20 mg by mouth 2 (two) times daily. 02/25/15   Historical Provider, MD  Blood Glucose Monitoring Suppl (FREESTYLE LITE) DEVI See admin instructions. 01/12/15   Historical Provider, MD  doxepin (SINEQUAN) 100 MG capsule Take 1 capsule (100 mg total) by mouth at bedtime. Patient not taking: Reported on 03/18/2015 02/05/15   Audelia Hives Presson, PA  fluticasone (FLONASE) 50 MCG/ACT nasal spray Place 2 sprays into both nostrils daily. Patient not taking: Reported on 03/18/2015 12/30/14   Harden Mo, MD  glucose blood (FREESTYLE LITE) test strip Use as instructed Patient not taking: Reported on 03/18/2015 12/24/14   Tresa Garter, MD  glucose monitoring kit (FREESTYLE) monitoring kit 1 each by Does not apply route as needed for other. Test blood sugars twice daily and when needed Patient not taking: Reported on 03/18/2015 01/12/15   Tresa Garter, MD  insulin aspart (NOVOLOG) 100 UNIT/ML injection Inject 0-9 Units into the skin 3 (three) times daily with meals. CBG < 70: Eat or drink something sweet and recheck. CBG 70 - 120: 0 units CBG 121 - 150: 1 unit CBG 151 - 200: 2  units CBG 201 - 250: 3 units CBG 251 - 300: 5 units CBG 301 - 350: 7 units CBG 351 - 400: 9 units CBG > 400: call MD. Patient not taking: Reported on 03/18/2015 02/02/15   Modena Jansky, MD  insulin detemir (LEVEMIR) 100 UNIT/ML injection Inject 0.35 mLs (35 Units total) into the skin daily. Patient not taking: Reported on 03/18/2015 12/22/14   Ripudeep Krystal Eaton, MD  insulin detemir (LEVEMIR) 100 UNIT/ML injection Inject 0.35 mLs (35 Units total)  into the skin daily. Patient not taking: Reported on 03/18/2015 02/05/15   Lutricia Feil, PA  INSULIN SYRINGE .5CC/29G 29G X 1/2" 0.5 ML MISC 1 vial by Does not apply route 2 (two) times daily. Patient not taking: Reported on 03/18/2015 12/22/14   Waldon Merl, PA-C  Insulin Syringe-Needle U-100 30G X 5/16" 0.5 ML MISC Test blood glucose 3 times daily Patient not taking: Reported on 03/18/2015 12/24/14   Tresa Garter, MD  Lancets (FREESTYLE) lancets Use as instructed Patient not taking: Reported on 03/18/2015 12/24/14   Tresa Garter, MD   Physical Exam: Blood pressure 118/85, pulse 95, temperature 97.7 F (36.5 C), temperature source Oral, resp. rate 14, height $RemoveBe'6\' 1"'rgbqJJmIX$  (1.854 m), weight 86 kg (189 lb 9.5 oz), SpO2 97 %. Filed Vitals:   03/20/15 1532  BP: 118/85  Pulse: 95  Temp:   Resp: 14     General:  He was sleepy, wake up, answer questions.   Eyes: PERLA  ENT: no tonsillar enlargement.   Neck: no thyromegaly, supple.   Cardiovascular: S 1, S 2 RRR  Respiratory: CTA  Abdomen: BS present, soft, nt, nd  Skin: no rashes.   Musculoskeletal: normal motor tone.  Neurologic: alert , following command, motor strength 5/5.   Labs on Admission:  Basic Metabolic Panel:  Recent Labs Lab 03/18/15 1836 03/20/15 0630 03/20/15 1302  NA 135 138 136  K 4.4 3.7 4.5  CL 94* 99 101  CO2 $Re'26 27 26  'YuA$ GLUCOSE 452* 274* 379*  BUN $Re'16 22 23  'rHh$ CREATININE 0.70 0.64 0.99  CALCIUM 9.6 8.4 8.7   Liver Function Tests:  Recent Labs Lab 03/18/15 1836 03/20/15 0630  AST 23 14  ALT 19 15  ALKPHOS 86 65  BILITOT 0.8 <0.1*  PROT 7.0 5.6*  ALBUMIN 4.5 3.5   No results for input(s): LIPASE, AMYLASE in the last 168 hours. No results for input(s): AMMONIA in the last 168 hours. CBC:  Recent Labs Lab 03/18/15 1836  WBC 7.6  HGB 14.4  HCT 43.1  MCV 86.5  PLT 232   Cardiac Enzymes: No results for input(s): CKTOTAL, CKMB, CKMBINDEX, TROPONINI in the last 168  hours. BNP: Invalid input(s): POCBNP CBG:  Recent Labs Lab 03/20/15 0003 03/20/15 0611 03/20/15 1143 03/20/15 1231 03/20/15 1433  GLUCAP 318* 253* 468* 427* 207*    Radiological Exams on Admission: No results found.  EKG: none available.   Time spent: 75 minutes.   Niel Hummer A [facility] Pager [phone]  If 7PM-7AM, please contact night-coverage [email or web address] Password [facility] 03/20/2015, 5:16 PM

## 2015-03-20 NOTE — ED Notes (Signed)
Bed: SV77 Expected date: 03/20/15 Expected time: 8:09 AM Means of arrival: Ambulance Comments: EMS BH.

## 2015-03-20 NOTE — ED Notes (Signed)
Report given to [facility] at [facility].

## 2015-03-20 NOTE — ED Notes (Addendum)
Pt transported by [facility] and sitter back to behavioral health. He is alert, oriented,and ambulatory upon transfer. He denies pain. Pt did not bring any personal belonging to ED.

## 2015-03-20 NOTE — ED Notes (Signed)
Pt brought over from [facility] due to elevated CBG, pt accompanied by [employer] and sitter.

## 2015-03-20 NOTE — ED Notes (Signed)
Blood sugar 427, did not cross over

## 2015-03-20 NOTE — ED Notes (Addendum)
Pt asking for [place] sandwich. Pt was made aware that he is unable to have a sandwich right now due to CBG in 400's. Pt agitated and requests to speak to this RN's "boss" and states while pressing call button repeatedly "medicare fraud, I want a [place] sandwich and diet coke".

## 2015-03-20 NOTE — ED Notes (Signed)
MD Kohut at bedside. 

## 2015-03-20 NOTE — ED Notes (Signed)
[facility] called to take patient back to [facility].

## 2015-03-20 NOTE — Progress Notes (Signed)
D Pt asleep from 0730 to about 1115. Pt OOB UAL on the unit and then into see Dr. Elna Breslow. Folllowing this,  pt's CBG obtained prior to lunch and CBG = 468. Pt demanding  Ginger ale and then became out of control irate that this nurse would not give  Him any. HE is tangential, irritable and agitated. A RN spoke with Dr. Elna Breslow and pt will be sent to ED for IVF's and treatment  Of hypergylcemia. AC Thurman Coyer made aware and non emergency 911 called. Pt  Given po depakote, ativan and vistaril . Awaiting 911.

## 2015-03-20 NOTE — ED Notes (Signed)
Pt repeatedly stating " Diabetes is not real"

## 2015-03-20 NOTE — Progress Notes (Signed)
Pt CBG was 522 tonight. On call provider and [facility] notified. One time order for 5 units of novolog given and scheduled Levemir. Vital signs taken and WNL. Pt has no complaints and denies any symptoms. Fluids encouraged. Rechecked CBG one hour later. CBG was 510. On call provider notified. Consult placed. Awaiting orders and encouraging fluids. Pt denies any symptoms and is currently sleeping. Breathing even and unlabored. Will continue to monitor. Q15 min safety checks maintained.

## 2015-03-20 NOTE — Progress Notes (Signed)
Patient's Blood sugar reading @10 :00pm is 450mg /dl. No coverage ordered. Chart reviewed, home med includes daily  use of LEVEMIR 35 units SQ. Hospitalist, Dr. contacted, reviewed labs, no DKA and recommended LEVEMIR 35 units SQ now and then QHS, NovoLOG 4 unites SQ now, Glycemic Control SS Protocol and Comprehensive Metabolic Panel in AM. All order completed and , RN notified.   Niel Hummer, PMHNP-BC

## 2015-03-20 NOTE — BHH Group Notes (Signed)
BHH Group Notes:  (Clinical Social Work)  03/20/2015  11:15-12:00PM  Summary of Progress/Problems:   The main focus of today's process group was to discuss patients' feelings about hospitalization, the stigma attached to mental health, and sources of motivation to stay well.  We then worked to identify a specific plan to avoid future hospitalizations when discharged from the hospital for this admission.  The patient expressed that he does not need to be in the hospital, but is nonetheless "easier" and "more comfortable" here.  He also stated that it is not fair that we are withholding alcohol and cigarettes from patients, repeatedly stating those are the main items that he and many other patients need to get better.  He stated that the patients are "here to help you too.  You need to give [place] a script like on TV to follow."  As other patients were saying he looks like Ronna Polio or Jesus, he stated that he believes he looks like the character Lucifer on a new TV show, and that actor actually talks to him through the television.  Type of Therapy:  Group Therapy - Process  Participation Level:  Active  Participation Quality:  Intrusive, Inattentive and Monopolizing  Affect:  Blunted  Cognitive:  Disorganized and Delusional  Insight:  Limited  Engagement in Therapy:  Limited  Modes of Intervention:  Exploration, Discussion  Ambrose Mantle, LCSW 03/20/2015, 4:34 PM

## 2015-03-20 NOTE — ED Notes (Signed)
Sitter for  [facility] remains at bedside. Security on standby

## 2015-03-20 NOTE — BHH Suicide Risk Assessment (Signed)
[facility] Admission Suicide Risk Assessment   Nursing information obtained from:  Patient Demographic factors:  Caucasian, Living alone Current Mental Status:  NA Loss Factors:  NA Historical Factors:  Impulsivity Risk Reduction Factors:  NA Total Time spent with patient: 20 minutes Principal Problem: Schizophrenia Diagnosis:   Patient Active Problem List   Diagnosis Date Noted  . Schizophrenia [F20.9] 03/20/2015  . HCAP (healthcare-associated pneumonia) [J18.9] 01/23/2015  . Hyperglycemia [R73.9] 12/21/2014  . DKA (diabetic ketoacidoses) [E13.10] 11/18/2014  . Hip pain, bilateral [M25.551, M25.552] 11/18/2014  . DKA, type 1 [E10.10] 06/17/2012  . Hyponatremia [E87.1] 06/17/2012  . Dehydration [E86.0] 06/17/2012  . Diabetes type 1, uncontrolled [E10.65] 02/19/2012     Continued Clinical Symptoms:  Alcohol Use Disorder Identification Test Final Score (AUDIT): 5 The "Alcohol Use Disorders Identification Test", Guidelines for Use in Primary Care, Second Edition.  World Science writer [facility]). Score between 0-7:  no or low risk or alcohol related problems. Score between 8-15:  moderate risk of alcohol related problems. Score between 16-19:  high risk of alcohol related problems. Score 20 or above:  warrants further diagnostic evaluation for alcohol dependence and treatment.   CLINICAL FACTORS:   Schizophrenia:   Less than 32 years old   Musculoskeletal: Strength & Muscle Tone: within normal limits Gait & Station: normal Patient leans: N/A  Psychiatric Specialty Exam: Physical Exam  Review of Systems  Constitutional: Negative.   HENT: Negative.   Eyes: Negative.   Cardiovascular: Negative.   Genitourinary: Negative.   Musculoskeletal: Negative.   Skin: Negative.   Neurological: Negative.   Psychiatric/Behavioral: Positive for hallucinations and substance abuse. The patient is nervous/anxious.     Blood pressure 118/70, pulse 103, temperature 97.7 F (36.5 C),  temperature source Oral, resp. rate 22, height 6\' 1"  (1.854 m), weight 86 kg (189 lb 9.5 oz), SpO2 100 %.Body mass index is 25.02 kg/(m^2).            Please see H&P.                                              COGNITIVE FEATURES THAT CONTRIBUTE TO RISK:  Closed-mindedness, Polarized thinking and Thought constriction (tunnel vision)    SUICIDE RISK:   Mild:  Suicidal ideation of limited frequency, intensity, duration, and specificity.  There are no identifiable plans, no associated intent, mild dysphoria and related symptoms, good self-control (both objective and subjective assessment), few other risk factors, and identifiable protective factors, including available and accessible social support.  PLAN OF CARE: Please see H&P.   Medical Decision Making:  New problem, with additional work up planned, Review of Psycho-Social Stressors (1), Review or order clinical lab tests (1), Review and summation of old records (2), Established Problem, Worsening (2), Review of Last Therapy Session (1), Review of Medication Regimen & Side Effects (2) and Review of New Medication or Change in Dosage (2)  I certify that inpatient services furnished can reasonably be expected to improve the patient's condition.   Areyanna Figeroa md 03/20/2015, 12:37 PM

## 2015-03-20 NOTE — H&P (Signed)
Psychiatric Admission Assessment Adult  Patient Identification: [name] MRN:  [id number] Date of Evaluation:  03/20/2015 Chief Complaint; Patient states " I am not sure why I am here.'    Principal Diagnosis: Schizophrenia Diagnosis:   Patient Active Problem List   Diagnosis Date Noted  . Schizophrenia [F20.9] 03/20/2015  . HCAP (healthcare-associated pneumonia) [J18.9] 01/23/2015  . Hyperglycemia [R73.9] 12/21/2014  . DKA (diabetic ketoacidoses) [E13.10] 11/18/2014  . Hip pain, bilateral [M25.551, M25.552] 11/18/2014  . DKA, type 1 [E10.10] 06/17/2012  . Hyponatremia [E87.1] 06/17/2012  . Dehydration [E86.0] 06/17/2012  . Diabetes type 1, uncontrolled [E10.65] 02/19/2012       History of Present Illness:: [name] is a 29 y.o. c male with a PMHx of DM,schizophrenia, polysubstance abuse, chronic pain, scoliosis, noncompliance, GERD, and b/l hip arthritis, who presented to the  [facility] accompanied by law enforcement who were called out for bizarre behavior. Per initial notes in EHR , patient was brought in for abnormal behavior . Patient in ED [place] Patient otherwise was unable to provide further details while in ED.  Patient also has uncontrolled DM , had DKA in the past , his CBG was >450 in the ED , he required management for the same. After coming to [facility] , patient's CBG continued to be elevated . Pt otherwise noted to have slurred speech ( unknown if this is baseline). Patient was oriented to person , place and time. Patient is a very limited historian and hence could not give much details about his past hx.  Patient when asked about AH ,reported " I can hear your voice and mine.' Pt appeared to be smiling and talkative during evaluation , speech somewhat pressured . Pt denied SI/HI/Paranoia.  Patient reported being born in [place]. Pt reports having a good child hood. Went up to 8 th  grade . Pt lives alone in [place], is separated from his spouse, is on SSD for schizophrenia.  Patient reported previous inpatient hospitalizations here at [facility] as well as in [place]. Pt reports not having an outpatient psychiarist. However last admission was referred to [facility].  Patient reports abusing alcohol , atleast once a week to the point that he gets drunk the patient currently denies withdrawal sx. 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   Elements:  Location:  disorganized, labile mood. Quality:  patient is pressured speech , disorganized behavior , elevated CBG due to uncontrolled DM. Severity:  Severe. Timing:  constant. Duration:  past 2 days. Context:  hx of schizophrenia , alcohol abuse , uncontrolled DM.[name] Associated Signs/Symptoms: Depression Symptoms:  anxiety, (Hypo) Manic Symptoms:  Distractibility, Impulsivity, Labiality of Mood, Anxiety Symptoms:  anxiety unspecified Psychotic Symptoms:  unable to specifiy if he hears AH/VH PTSD Symptoms: NA Total Time spent with patient: 1 hour  Past Medical History:  Past Medical History  Diagnosis Date  . Diabetes mellitus   . Depression   . Schizo affective schizophrenia   . Polysubstance abuse   . Scoliosis   . Chronic pain   . Noncompliance with medication regimen   . DKA (diabetic ketoacidoses) 11/18/2014  . GERD (gastroesophageal reflux disease)   . Arthritis     "both hips" (12/21/2014)   History reviewed. No pertinent past surgical history. Family History:  Family History  Problem Relation Age of Onset  . Diabetes Mother  Social History:  History  Alcohol Use  . Yes    Comment: 12/21/2014 "I drink on a rare occasion"     History  Drug Use  . Yes  . Special: Marijuana    Comment: 12/21/2014 "I use marijuana on a rare occasion", last used marijuana 01/30/2015    History   Social History  . Marital Status: Married    Spouse Name: N/A  . Number of Children:  N/A  . Years of Education: N/A   Social History Main Topics  . Smoking status: Current Some Day Smoker -- 1.00 packs/day for 7 years    Types: Cigarettes  . Smokeless tobacco: Never Used  . Alcohol Use: Yes     Comment: 12/21/2014 "I drink on a rare occasion"  . Drug Use: Yes    Special: Marijuana     Comment: 12/21/2014 "I use marijuana on a rare occasion", last used marijuana 01/30/2015  . Sexual Activity: Yes   Other Topics Concern  . None   Social History Narrative   ** Merged History Encounter **       Additional Social History:                          Musculoskeletal: Strength & Muscle Tone: within normal limits Gait & Station: normal Patient leans: N/A  Psychiatric Specialty Exam: Physical Exam  Constitutional: He is oriented to person, place, and time. He appears well-developed.  HENT:  Head: Normocephalic and atraumatic.  Eyes: Conjunctivae and EOM are normal. Pupils are equal, round, and reactive to light.  Neck: Normal range of motion. Neck supple.  Cardiovascular: Normal rate and regular rhythm.   Respiratory: Effort normal and breath sounds normal.  GI: Soft.  Musculoskeletal: Normal range of motion.  Neurological: He is alert and oriented to person, place, and time.  Skin: Skin is warm.  Psychiatric: His mood appears anxious. His affect is labile and inappropriate. His speech is slurred. He is hyperactive. Thought content is delusional. Cognition and memory are normal. He expresses impulsivity. He is inattentive.    Review of Systems  Constitutional: Negative.   HENT: Negative.   Eyes: Negative.   Respiratory: Negative.   Cardiovascular: Negative.   Gastrointestinal: Negative.   Genitourinary: Negative.   Musculoskeletal: Negative.   Skin: Negative.   Neurological: Negative.   Psychiatric/Behavioral: The patient is nervous/anxious.     Blood pressure 118/70, pulse 103, temperature 97.7 F (36.5 C), temperature source Oral, resp. rate 22,  height 6\' 1"  (1.854 m), weight 86 kg (189 lb 9.5 oz), SpO2 100 %.Body mass index is 25.02 kg/(m^2).  General Appearance: Disheveled  Eye Contact::  Good  Speech:  Pressured and Slurred  Volume:  Normal  Mood:  Euphoric  Affect:  Labile  Thought Process:  Disorganized  Orientation:  Full (Time, Place, and Person)  Thought Content:  WDL  Suicidal Thoughts:  No  Homicidal Thoughts:  No  Memory:  Immediate;   Fair Recent;   Fair Remote;   Poor  Judgement:  Impaired  Insight:  Lacking  Psychomotor Activity:  Restlessness  Concentration:  Poor  Recall:  [phone] of Knowledge:Fair  Language: Fair  Akathisia:  No  Handed:  Right  AIMS (if indicated):     Assets:  Others:  access to health care  ADL's:  Intact  Cognition: WNL  Sleep:      Risk to Self:  denies SI, but is disorganized Risk to Others:  denies HI Prior Inpatient Therapy:  Yes at [facility] Prior Outpatient Therapy:  [facility]  Alcohol Screening: 1. How often do you have a drink containing alcohol?: 2 to 4 times a month 2. How many drinks containing alcohol do you have on a typical day when you are drinking?: 3 or 4 3. How often do you have six or more drinks on one occasion?: Less than monthly Preliminary Score: 2 4. How often during the last year have you found that you were not able to stop drinking once you had started?: Less than monthly 5. How often during the last year have you failed to do what was normally expected from you becasue of drinking?: Never 6. How often during the last year have you needed a first drink in the morning to get yourself going after a heavy drinking session?: Never 7. How often during the last year have you had a feeling of guilt of remorse after drinking?: Never 8. How often during the last year have you been unable to remember what happened the night before because you had been drinking?: Never 9. Have you or someone else been injured as a result of your drinking?: No 10. Has a relative or  friend or a doctor or another health worker been concerned about your drinking or suggested you cut down?: No Alcohol Use Disorder Identification Test Final Score (AUDIT): 5 Brief Intervention: AUDIT score less than 7 or less-screening does not suggest unhealthy drinking-brief intervention not indicated  Allergies:   Allergies  Allergen Reactions  . Sulfa Antibiotics Other (See Comments)    Unknown - Childhood   Lab Results:  Results for orders placed or performed during the hospital encounter of 03/19/15 (from the past 48 hour(s))  Glucose, capillary     Status: Abnormal   Collection Time: 03/19/15 10:01 PM  Result Value Ref Range   Glucose-Capillary 423 (H) 70 - 99 mg/dL  Glucose, capillary     Status: Abnormal   Collection Time: 03/20/15 12:03 AM  Result Value Ref Range   Glucose-Capillary 318 (H) 70 - 99 mg/dL  Glucose, capillary     Status: Abnormal   Collection Time: 03/20/15  6:11 AM  Result Value Ref Range   Glucose-Capillary 253 (H) 70 - 99 mg/dL  Comprehensive metabolic panel     Status: Abnormal   Collection Time: 03/20/15  6:30 AM  Result Value Ref Range   Sodium 138 135 - 145 mmol/L   Potassium 3.7 3.5 - 5.1 mmol/L   Chloride 99 96 - 112 mmol/L   CO2 27 19 - 32 mmol/L   Glucose, Bld 274 (H) 70 - 99 mg/dL   BUN 22 6 - 23 mg/dL   Creatinine, Ser 0.64 0.50 - 1.35 mg/dL   Calcium 8.4 8.4 - 10.5 mg/dL   Total Protein 5.6 (L) 6.0 - 8.3 g/dL   Albumin 3.5 3.5 - 5.2 g/dL   AST 14 0 - 37 U/L   ALT 15 0 - 53 U/L   Alkaline Phosphatase 65 39 - 117 U/L   Total Bilirubin <0.1 (L) 0.3 - 1.2 mg/dL   GFR calc non Af Amer >90 >90 mL/min   GFR calc Af Amer >90 >90 mL/min    Comment: (NOTE) The eGFR has been calculated using the CKD EPI equation. This calculation has not been validated in all clinical situations. eGFR's persistently <90 mL/min signify possible Chronic Kidney Disease.    Anion gap 12 5 - 15    Comment: Performed at  [facility]  Glucose,  capillary     Status: Abnormal   Collection Time: 03/20/15 11:43 AM  Result Value Ref Range   Glucose-Capillary 468 (H) 70 - 99 mg/dL   Comment 1 Notify RN   CBG monitoring, ED     Status: Abnormal   Collection Time: 03/20/15 12:31 PM  Result Value Ref Range   Glucose-Capillary 427 (H) 70 - 99 mg/dL   Current Medications: Current Facility-Administered Medications  Medication Dose Route Frequency Provider Last Rate Last Dose  . acetaminophen (TYLENOL) tablet 650 mg  650 mg Oral Q6H PRN Delfin Gant, NP      . alum & mag hydroxide-simeth (MAALOX/MYLANTA) 200-200-20 MG/5ML suspension 30 mL  30 mL Oral Q4H PRN Delfin Gant, NP      . benztropine (COGENTIN) tablet 0.5 mg  0.5 mg Oral BID Ursula Alert, MD      . divalproex (DEPAKOTE) DR tablet 500 mg  500 mg Oral Q12H Konstantinos Cordoba, MD   500 mg at 03/20/15 1153  . hydrOXYzine (ATARAX/VISTARIL) tablet 25 mg  25 mg Oral TID PRN Kerrie Buffalo, NP   25 mg at 03/20/15 1153  . insulin aspart (novoLOG) injection 0-9 Units  0-9 Units Subcutaneous TID WC Harriet Butte, NP   9 Units at 03/20/15 1149  . insulin detemir (LEVEMIR) injection 35 Units  35 Units Subcutaneous QHS Harriet Butte, NP   35 Units at 03/19/15 2255  . LORazepam (ATIVAN) tablet 1 mg  1 mg Oral Q6H PRN Ursula Alert, MD   1 mg at 03/20/15 1153  . OLANZapine zydis (ZYPREXA) disintegrating tablet 5 mg  5 mg Oral Q8H PRN Ursula Alert, MD       And  . LORazepam (ATIVAN) tablet 1 mg  1 mg Oral PRN Ursula Alert, MD       And  . ziprasidone (GEODON) injection 20 mg  20 mg Intramuscular PRN Anora Schwenke, MD      . magnesium hydroxide (MILK OF MAGNESIA) suspension 30 mL  30 mL Oral Daily PRN Delfin Gant, NP      . nicotine polacrilex (NICORETTE) gum 2 mg  2 mg Oral PRN Ursula Alert, MD   2 mg at 03/19/15 1835  . polymixin-bacitracin (POLYSPORIN) ointment   Topical BID Saida Lonon, MD      . risperiDONE (RISPERDAL M-TABS) disintegrating tablet 1 mg  1 mg  Oral BID Ursula Alert, MD      . sterile water (preservative free) injection           . traZODone (DESYREL) tablet 50 mg  50 mg Oral QHS PRN Kerrie Buffalo, NP   50 mg at 03/20/15 0016   Current Outpatient Prescriptions  Medication Sig Dispense Refill  . acetaminophen-codeine (TYLENOL #3) 300-30 MG per tablet   0  . amphetamine-dextroamphetamine (ADDERALL) 20 MG tablet Take 20 mg by mouth 2 (two) times daily.  0  . Blood Glucose Monitoring Suppl (FREESTYLE LITE) DEVI See admin instructions.  0  . doxepin (SINEQUAN) 100 MG capsule Take 1 capsule (100 mg total) by mouth at bedtime. (Patient not taking: Reported on 03/18/2015) 3 capsule 0  . fluticasone (FLONASE) 50 MCG/ACT nasal spray Place 2 sprays into both nostrils daily. (Patient not taking: Reported on 03/18/2015) 16 g 0  . glucose blood (FREESTYLE LITE) test strip Use as instructed (Patient not taking: Reported on 03/18/2015) 100 each 12  . glucose monitoring kit (FREESTYLE) monitoring kit 1 each by Does  not apply route as needed for other. Test blood sugars twice daily and when needed (Patient not taking: Reported on 03/18/2015) 1 each 0  . insulin aspart (NOVOLOG) 100 UNIT/ML injection Inject 0-9 Units into the skin 3 (three) times daily with meals. CBG < 70: Eat or drink something sweet and recheck. CBG 70 - 120: 0 units CBG 121 - 150: 1 unit CBG 151 - 200: 2 units CBG 201 - 250: 3 units CBG 251 - 300: 5 units CBG 301 - 350: 7 units CBG 351 - 400: 9 units CBG > 400: call MD. (Patient not taking: Reported on 03/18/2015)    . insulin detemir (LEVEMIR) 100 UNIT/ML injection Inject 0.35 mLs (35 Units total) into the skin daily. (Patient not taking: Reported on 03/18/2015) 10 mL 11  . insulin detemir (LEVEMIR) 100 UNIT/ML injection Inject 0.35 mLs (35 Units total) into the skin daily. (Patient not taking: Reported on 03/18/2015) 10 mL 0  . INSULIN SYRINGE .5CC/29G 29G X 1/2" 0.5 ML MISC 1 vial by Does not apply route 2 (two) times daily.  (Patient not taking: Reported on 03/18/2015) 50 each 0  . Insulin Syringe-Needle U-100 30G X 5/16" 0.5 ML MISC Test blood glucose 3 times daily (Patient not taking: Reported on 03/18/2015) 100 each 12  . Lancets (FREESTYLE) lancets Use as instructed (Patient not taking: Reported on 03/18/2015) 100 each 12  . [DISCONTINUED] citalopram (CELEXA) 20 MG tablet Take 20 mg by mouth daily.    . [DISCONTINUED] FLUoxetine (PROZAC) 20 MG tablet Take 20 mg by mouth every morning.    . [DISCONTINUED] gabapentin (NEURONTIN) 600 MG tablet Take 600 mg by mouth 3 (three) times daily.    . [DISCONTINUED] insulin glargine (LANTUS) 100 UNIT/ML injection Inject 30 Units into the skin at bedtime. 10 mL 3  . [DISCONTINUED] insulin lispro (HUMALOG) 100 UNIT/ML injection Inject 10 Units into the skin 3 (three) times daily before meals.      . [DISCONTINUED] OLANZapine zydis (ZYPREXA) 10 MG disintegrating tablet Take 10 mg by mouth 2 (two) times daily.    . [DISCONTINUED] sertraline (ZOLOFT) 50 MG tablet Take 50 mg by mouth daily. For depression. Just started med, have not picked up yet [facility] [address]     PTA Medications:  (Not in a hospital admission)  Previous Psychotropic Medications: Yes , haldol  Substance Abuse History in the last 12 months:  Yes.      Consequences of Substance Abuse: Medical Consequences:  Recent admission  Results for orders placed or performed during the hospital encounter of 03/19/15 (from the past 72 hour(s))  Glucose, capillary     Status: Abnormal   Collection Time: 03/19/15 10:01 PM  Result Value Ref Range   Glucose-Capillary 423 (H) 70 - 99 mg/dL  Glucose, capillary     Status: Abnormal   Collection Time: 03/20/15 12:03 AM  Result Value Ref Range   Glucose-Capillary 318 (H) 70 - 99 mg/dL  Glucose, capillary     Status: Abnormal   Collection Time: 03/20/15  6:11 AM  Result Value Ref Range   Glucose-Capillary 253 (H) 70 - 99 mg/dL  Comprehensive metabolic panel      Status: Abnormal   Collection Time: 03/20/15  6:30 AM  Result Value Ref Range   Sodium 138 135 - 145 mmol/L   Potassium 3.7 3.5 - 5.1 mmol/L   Chloride 99 96 - 112 mmol/L   CO2 27 19 - 32 mmol/L   Glucose, Bld 274 (H) 70 -  99 mg/dL   BUN 22 6 - 23 mg/dL   Creatinine, Ser 0.64 0.50 - 1.35 mg/dL   Calcium 8.4 8.4 - 10.5 mg/dL   Total Protein 5.6 (L) 6.0 - 8.3 g/dL   Albumin 3.5 3.5 - 5.2 g/dL   AST 14 0 - 37 U/L   ALT 15 0 - 53 U/L   Alkaline Phosphatase 65 39 - 117 U/L   Total Bilirubin <0.1 (L) 0.3 - 1.2 mg/dL   GFR calc non Af Amer >90 >90 mL/min   GFR calc Af Amer >90 >90 mL/min    Comment: (NOTE) The eGFR has been calculated using the CKD EPI equation. This calculation has not been validated in all clinical situations. eGFR's persistently <90 mL/min signify possible Chronic Kidney Disease.    Anion gap 12 5 - 15    Comment: Performed at [facility]  Glucose, capillary     Status: Abnormal   Collection Time: 03/20/15 11:43 AM  Result Value Ref Range   Glucose-Capillary 468 (H) 70 - 99 mg/dL   Comment 1 Notify RN   CBG monitoring, ED     Status: Abnormal   Collection Time: 03/20/15 12:31 PM  Result Value Ref Range   Glucose-Capillary 427 (H) 70 - 99 mg/dL    Observation Level/Precautions:  15 minute checks  Laboratory:  HbAIC .[name] Will get Hba1c,EKG,TSH,lipid panel,UDS ,CMP,CBC if not already done   Psychotherapy: Individual and group therapy    Medications:  As needed  Consultations:  Social worker consult  Discharge Concerns:  Stability and safety  Estimated LOS:  Other:     Psychological Evaluations: No   Treatment Plan Summary: Daily contact with patient to assess and evaluate symptoms and progress in treatment and Medication management  Patient will benefit from inpatient treatment and stabilization.  Estimated length of stay is 5-7 days.  Patient with elevated CBG levels > 450 , in the setting of uncontrolled DM , noncompliance with  medications , alcohol abuse . Pt to be send to ED for evaluation and management. Reviewed past medical records,treatment plan.  Will continue to monitor vitals ,medication compliance and treatment side effects while patient is here.  Will monitor for medical issues as well as call consult as needed.  Reviewed labs ,will order as needed.  CSW will start working on disposition.  Patient to participate in therapeutic milieu .       Medical Decision Making:  New problem, with additional work up planned, Review of Psycho-Social Stressors (1), Review or order clinical lab tests (1), Discuss test with performing physician (1), Established Problem, Worsening (2), Review of Last Therapy Session (1), Review of Medication Regimen & Side Effects (2) and Review of New Medication or Change in Dosage (2)  I certify that inpatient services furnished can reasonably be expected to improve the patient's condition.   Campbell Kray md 4/2/20161:00 PM

## 2015-03-21 DIAGNOSIS — R197 Diarrhea, unspecified: Secondary | ICD-10-CM

## 2015-03-21 LAB — CBG MONITORING, ED
Glucose-Capillary: 190 mg/dL — ABNORMAL HIGH (ref 70–99)
Glucose-Capillary: 237 mg/dL — ABNORMAL HIGH (ref 70–99)

## 2015-03-21 LAB — GLUCOSE, CAPILLARY
GLUCOSE-CAPILLARY: 258 mg/dL — AB (ref 70–99)
Glucose-Capillary: 465 mg/dL — ABNORMAL HIGH (ref 70–99)
Glucose-Capillary: 188 mg/dL — ABNORMAL HIGH (ref 70–99)

## 2015-03-21 LAB — CLOSTRIDIUM DIFFICILE BY PCR: Toxigenic C. Difficile by PCR: NEGATIVE

## 2015-03-21 MED ORDER — INSULIN ASPART 100 UNIT/ML ~~LOC~~ SOLN
10.0000 [IU] | Freq: Three times a day (TID) | SUBCUTANEOUS | Status: DC
  Administered 2015-03-21: 10 [IU] via SUBCUTANEOUS

## 2015-03-21 MED ORDER — LOPERAMIDE HCL 2 MG PO CAPS
4.0000 mg | ORAL_CAPSULE | Freq: Four times a day (QID) | ORAL | Status: DC | PRN
Start: 2015-03-21 — End: 2015-03-26
  Filled 2015-03-21: qty 2

## 2015-03-21 MED ORDER — ZIPRASIDONE MESYLATE 20 MG IM SOLR
20.0000 mg | Freq: Once | INTRAMUSCULAR | Status: AC
Start: 2015-03-21 — End: 2015-03-21
  Administered 2015-03-21: 20 mg via INTRAMUSCULAR
  Filled 2015-03-21: qty 20

## 2015-03-21 MED ORDER — INSULIN ASPART 100 UNIT/ML ~~LOC~~ SOLN: 5.0000 [IU] | Freq: Three times a day (TID) | SUBCUTANEOUS | Status: DC

## 2015-03-21 MED ORDER — ZIPRASIDONE MESYLATE 20 MG IM SOLR
INTRAMUSCULAR | Status: AC
  Filled 2015-03-21: qty 20

## 2015-03-21 MED ORDER — LOPERAMIDE HCL 2 MG PO CAPS
4.0000 mg | ORAL_CAPSULE | Freq: Once | ORAL | Status: AC
  Administered 2015-03-21: 4 mg via ORAL

## 2015-03-21 MED ORDER — INSULIN ASPART 100 UNIT/ML ~~LOC~~ SOLN
0.0000 [IU] | Freq: Three times a day (TID) | SUBCUTANEOUS | Status: DC
  Administered 2015-03-21: 9 [IU] via SUBCUTANEOUS
  Administered 2015-03-22: 5 [IU] via SUBCUTANEOUS
  Administered 2015-03-22 – 2015-03-23 (×2): 3 [IU] via SUBCUTANEOUS
  Administered 2015-03-23: 1 [IU] via SUBCUTANEOUS
  Administered 2015-03-23: 08:00:00 via SUBCUTANEOUS
  Administered 2015-03-24: 3 [IU] via SUBCUTANEOUS
  Administered 2015-03-24: 1 [IU] via SUBCUTANEOUS
  Administered 2015-03-24 – 2015-03-25 (×2): 3 [IU] via SUBCUTANEOUS

## 2015-03-21 NOTE — Progress Notes (Signed)
[facility] PROGRESS NOTE  [name] OJJ:[id number] DOB: Mar 12, 1986 DOA: 03/19/2015 PCP: No primary care provider on file.  Assessment/Plan: 1. Insulin dependent diabetes mellitus -Patient presenting to the emergency room with hyperglycemia having blood sugars in the 500s. Basic metabolic panel did not show an anion gap metabolic acidosis -His Levemir was restarted at 40 units subcutaneous daily. Sugars coming down into the 100-200's range -Will increase his mealtime coverage to 10 units of NovoLog -Continue Accu-Cheks  2.  Diarrhea -Patient reports ongoing diarrhea. Stool for C. difficile came back negative with his stool for GI pathogen panel pending at the time of this dictation. -I suspect symptoms secondary to infectious viral gastroenteritis -Continue supportive care, when necessary Imodium  Code Status: Full code Family Communication: Family not present Disposition Plan:     HPI/Subjective: Patient is a 29 year old gentleman with a past medical history of insulin-dependent diabetes mellitus, schizophrenia, admitted to behavior health for treatment of acute psychosis, was sent to the emergency department for treatment of hyperglycemia. He was found to have a blood sugar of 510. BMP did not reveal diabetic ketoacidosis. He was restarted on his basal insulin with Levemir 40 units subcutaneous daily at bedtime as well as mealtime coverage with NovoLog units subcutaneous 3 times a day. At sugars overall improving.  Objective: Filed Vitals:   03/20/15 2215  BP: 104/55  Pulse: 99  Temp:   Resp: 18   No intake or output data in the 24 hours ending 03/21/15 1344 Filed Weights   03/19/15 1830  Weight: 86 kg (189 lb 9.5 oz)    Exam:   General:  Patient is in no acute distress, reports ongoing diarrhea.  Cardiovascular: Regular rate and rhythm normal S1-S2  Respiratory: Clear to auscultation bilaterally  Abdomen: Has a generalized mild abdominal pain to  palpation, no rebound tenderness or guarding, positive bowel sounds  Musculoskeletal: No edema  Data Reviewed: Basic Metabolic Panel:  Recent Labs Lab 03/18/15 1836 03/20/15 0630 03/20/15 1302  NA 135 138 136  K 4.4 3.7 4.5  CL 94* 99 101  CO2 26 27 26   GLUCOSE 452* 274* 379*  BUN 16 22 23   CREATININE 0.70 0.64 0.99  CALCIUM 9.6 8.4 8.7   Liver Function Tests:  Recent Labs Lab 03/18/15 1836 03/20/15 0630  AST 23 14  ALT 19 15  ALKPHOS 86 65  BILITOT 0.8 <0.1*  PROT 7.0 5.6*  ALBUMIN 4.5 3.5   No results for input(s): LIPASE, AMYLASE in the last 168 hours. No results for input(s): AMMONIA in the last 168 hours. CBC:  Recent Labs Lab 03/18/15 1836  WBC 7.6  HGB 14.4  HCT 43.1  MCV 86.5  PLT 232   Cardiac Enzymes: No results for input(s): CKTOTAL, CKMB, CKMBINDEX, TROPONINI in the last 168 hours. BNP (last 3 results) No results for input(s): BNP in the last 8760 hours.  ProBNP (last 3 results) No results for input(s): PROBNP in the last 8760 hours.  CBG:  Recent Labs Lab 03/20/15 2133 03/20/15 2259 03/21/15 0034 03/21/15 0744 03/21/15 1257  GLUCAP 522* 510* 258* 190* 237*    Recent Results (from the past 240 hour(s))  Clostridium Difficile by PCR     Status: None   Collection Time: 03/21/15  8:13 AM  Result Value Ref Range Status   C difficile by pcr NEGATIVE NEGATIVE Final     Studies: No results found.  Scheduled Meds: . benztropine  0.5 mg Oral BID  . divalproex  500 mg Oral  Q12H  . insulin aspart  0-9 Units Subcutaneous TID WC  . insulin aspart  0-9 Units Subcutaneous TID WC  . insulin aspart  5 Units Subcutaneous TID WC  . insulin aspart  5 Units Subcutaneous TID WC  . insulin detemir  40 Units Subcutaneous QHS  . polymixin-bacitracin   Topical BID  . risperiDONE  1 mg Oral BID   Continuous Infusions:   Principal Problem:   Schizophrenia Active Problems:   Diabetes type 1, uncontrolled    Time spent: 25  min    Jeralyn Bennett  [facility] Pager [phone]. If 7PM-7AM, please contact night-coverage at [email or web address], password [facility] 03/21/2015, 1:44 PM  LOS: 2 days

## 2015-03-21 NOTE — ED Notes (Signed)
Pt received in room 31. Alert and oriented and denies having any needs at the moment other than more food to eat. Ambulated to TCU from the ED accompanied by rn and sitter. Pt is IVC'd. Resting quietly in bed with eyes closed. Will cont to care for pt. Vwilliams, rn.

## 2015-03-21 NOTE — Progress Notes (Signed)
Did not attend group 

## 2015-03-21 NOTE — ED Notes (Addendum)
Received pt. Transferred from [facility], allert and oriented x3, IVC, with sitter. Denies of any pain but has been to the bathroom twice, unable to save stool for lab. This time. To collect stool specimen for C-diff, sitter made aware.

## 2015-03-21 NOTE — Progress Notes (Signed)
[name] spent most of the day in then ED at [facility] and returned to the unit about 1530. He received 9u plus 10u plus 5 units for 24 units total at dinnertime for CBG 468. [name] His C diff results came back negative.   A He became agitated at 1800 and was threatening another patient and he received 20 units geodon IM tolerated well.AC obtained MD order for pt NOT to leave unit due to inappropriate behaviors and language.   R Safety in place.

## 2015-03-21 NOTE — ED Notes (Signed)
Pt. Asleep, unable to obtain information for this assessment.

## 2015-03-21 NOTE — ED Notes (Signed)
Pt discharged from unit. Transferred back to [facility]. Spoke with Dr. Juleen China who said he had discharged pt earlier. Spoke with Julieanne Cotton, NP who also said to call [facility] at [facility] and have pt pt transferred back to facility. Spoke with Tanna Savoy at [facility] who gave this rn instructions on how to carry out process, which included calling [employer] to transport pt. . [employer] called and pt transported to [facility]. Left unit ambulatory accompanied by 2 [employer] officers. Left in good condition. Vwilliams, rn.

## 2015-03-21 NOTE — Progress Notes (Signed)
Patient not seen today , since patient was sent out to the ED for evaluation of his uncontrolled DM as well as elevated CBG.  Jomarie Longs ,MD Attending Psychiatrist  [facility] 03/21/15 3:19 PM

## 2015-03-21 NOTE — Progress Notes (Signed)
Inpatient Diabetes Program Recommendations  AACE/ADA: New Consensus Statement on Inpatient Glycemic Control (2013)  Target Ranges:  Prepandial:   less than 140 mg/dL      Peak postprandial:   less than 180 mg/dL (1-2 hours)      Critically ill patients:  140 - 180 mg/dL   CBG's improving with current insulin orders.  Pt is well know to the Inpatient Diabetes Program (see previous notes from diabetes coordinator). No recommendations at this time.  Thank you  Piedad Climes BSN, RN,CDE Inpatient Diabetes Coordinator [phone] (team pager)

## 2015-03-21 NOTE — ED Provider Notes (Signed)
CSN: [id number]     Arrival date & time 03/20/15  1221 History   First MD Initiated Contact with Patient 03/20/15 1223     Chief Complaint  Patient presents with  . Hyperglycemia     (Consider location/radiation/quality/duration/timing/severity/associated sxs/prior Treatment) HPI   28yM with diarrhea. Pt currently IVC'd. He is not great historian.  Pt apparently with vomiting x2 and diarrhea x2. Per review of notes, one episode of vomiting was self induced.  Pt endorses diarrhea when specifically asked. Afebrile. Prescribed amoxicillin for sinusitis on 12/30/14.    Past Medical History  Diagnosis Date  . Diabetes mellitus   . Depression   . Schizo affective schizophrenia   . Polysubstance abuse   . Scoliosis   . Chronic pain   . Noncompliance with medication regimen   . DKA (diabetic ketoacidoses) 11/18/2014  . GERD (gastroesophageal reflux disease)   . Arthritis     "both hips" (12/21/2014)   History reviewed. No pertinent past surgical history. Family History  Problem Relation Age of Onset  . Diabetes Mother    History  Substance Use Topics  . Smoking status: Current Some Day Smoker -- 1.00 packs/day for 7 years    Types: Cigarettes  . Smokeless tobacco: Never Used  . Alcohol Use: Yes     Comment: 12/21/2014 "I drink on a rare occasion"    Review of Systems  Level 5 caveat because pt is psychotic.   Allergies  Sulfa antibiotics  Home Medications   Prior to Admission medications   Medication Sig Start Date End Date Taking? Authorizing Provider  acetaminophen-codeine (TYLENOL #3) 300-30 MG per tablet  01/12/15   Historical Provider, MD  amphetamine-dextroamphetamine (ADDERALL) 20 MG tablet Take 20 mg by mouth 2 (two) times daily. 02/25/15   Historical Provider, MD  Blood Glucose Monitoring Suppl (FREESTYLE LITE) DEVI See admin instructions. 01/12/15   Historical Provider, MD  doxepin (SINEQUAN) 100 MG capsule Take 1 capsule (100 mg total) by mouth at bedtime. Patient  not taking: Reported on 03/18/2015 02/05/15   Audelia Hives Presson, PA  fluticasone (FLONASE) 50 MCG/ACT nasal spray Place 2 sprays into both nostrils daily. Patient not taking: Reported on 03/18/2015 12/30/14   Harden Mo, MD  glucose blood (FREESTYLE LITE) test strip Use as instructed Patient not taking: Reported on 03/18/2015 12/24/14   Tresa Garter, MD  glucose monitoring kit (FREESTYLE) monitoring kit 1 each by Does not apply route as needed for other. Test blood sugars twice daily and when needed Patient not taking: Reported on 03/18/2015 01/12/15   Tresa Garter, MD  insulin aspart (NOVOLOG) 100 UNIT/ML injection Inject 0-9 Units into the skin 3 (three) times daily with meals. CBG < 70: Eat or drink something sweet and recheck. CBG 70 - 120: 0 units CBG 121 - 150: 1 unit CBG 151 - 200: 2 units CBG 201 - 250: 3 units CBG 251 - 300: 5 units CBG 301 - 350: 7 units CBG 351 - 400: 9 units CBG > 400: call MD. Patient not taking: Reported on 03/18/2015 02/02/15   Modena Jansky, MD  insulin detemir (LEVEMIR) 100 UNIT/ML injection Inject 0.35 mLs (35 Units total) into the skin daily. Patient not taking: Reported on 03/18/2015 12/22/14   Ripudeep Krystal Eaton, MD  insulin detemir (LEVEMIR) 100 UNIT/ML injection Inject 0.35 mLs (35 Units total) into the skin daily. Patient not taking: Reported on 03/18/2015 02/05/15   Lutricia Feil, PA  INSULIN SYRINGE .5CC/29G 29G  X 1/2" 0.5 ML MISC 1 vial by Does not apply route 2 (two) times daily. Patient not taking: Reported on 03/18/2015 12/22/14   Waldon Merl, PA-C  Insulin Syringe-Needle U-100 30G X 5/16" 0.5 ML MISC Test blood glucose 3 times daily Patient not taking: Reported on 03/18/2015 12/24/14   Tresa Garter, MD  Lancets (FREESTYLE) lancets Use as instructed Patient not taking: Reported on 03/18/2015 12/24/14   Tresa Garter, MD   BP 104/55 mmHg  Pulse 99  Temp(Src) 97.7 F (36.5 C) (Oral)  Resp 18  Ht $R'6\' 1"'CW$  (1.854 m)  Wt  189 lb 9.5 oz (86 kg)  BMI 25.02 kg/m2  SpO2 99% Physical Exam  Constitutional: He appears well-developed and well-nourished. No distress.  HENT:  Head: Normocephalic and atraumatic.  Eyes: Conjunctivae are normal. Right eye exhibits no discharge. Left eye exhibits no discharge.  Neck: Neck supple.  Cardiovascular: Normal rate, regular rhythm and normal heart sounds.  Exam reveals no gallop and no friction rub.   No murmur heard. Pulmonary/Chest: Effort normal and breath sounds normal. No respiratory distress.  Abdominal: Soft. He exhibits no distension. There is no tenderness.  Musculoskeletal: He exhibits no edema or tenderness.  Neurological:  Drowsy. Opens eyes to voice and answers questions/follows commands.   Skin: Skin is warm and dry.  Nursing note and vitals reviewed.   ED Course  Procedures (including critical care time) Labs Review Labs Reviewed  GLUCOSE, CAPILLARY - Abnormal; Notable for the following:    Glucose-Capillary 423 (*)    All other components within normal limits  GLUCOSE, CAPILLARY - Abnormal; Notable for the following:    Glucose-Capillary 318 (*)    All other components within normal limits  COMPREHENSIVE METABOLIC PANEL - Abnormal; Notable for the following:    Glucose, Bld 274 (*)    Total Protein 5.6 (*)    Total Bilirubin <0.1 (*)    All other components within normal limits  GLUCOSE, CAPILLARY - Abnormal; Notable for the following:    Glucose-Capillary 253 (*)    All other components within normal limits  GLUCOSE, CAPILLARY - Abnormal; Notable for the following:    Glucose-Capillary 468 (*)    All other components within normal limits  URINALYSIS, ROUTINE W REFLEX MICROSCOPIC - Abnormal; Notable for the following:    Specific Gravity, Urine 1.037 (*)    Glucose, UA >1000 (*)    All other components within normal limits  BASIC METABOLIC PANEL - Abnormal; Notable for the following:    Glucose, Bld 379 (*)    All other components within  normal limits  GLUCOSE, CAPILLARY - Abnormal; Notable for the following:    Glucose-Capillary 212 (*)    All other components within normal limits  GLUCOSE, CAPILLARY - Abnormal; Notable for the following:    Glucose-Capillary 522 (*)    All other components within normal limits  GLUCOSE, CAPILLARY - Abnormal; Notable for the following:    Glucose-Capillary 510 (*)    All other components within normal limits  GLUCOSE, CAPILLARY - Abnormal; Notable for the following:    Glucose-Capillary 258 (*)    All other components within normal limits  CBG MONITORING, ED - Abnormal; Notable for the following:    Glucose-Capillary 427 (*)    All other components within normal limits  CBG MONITORING, ED - Abnormal; Notable for the following:    Glucose-Capillary 207 (*)    All other components within normal limits  CLOSTRIDIUM DIFFICILE BY PCR  URINE  MICROSCOPIC-ADD ON  GI PATHOGEN PANEL BY PCR, STOOL  CBG MONITORING, ED    Imaging Review No results found.   EKG Interpretation None      MDM   Final diagnoses:  Undifferentiated schizophrenia  Diarrhea    28yM with diarrhea with symptom onset less than 12 hours ago.  I do not see documented history of prior c-diff. Was prescribed amoxicillin ~10 weeks ago. With his history of noncompliance, I'm not convinced he actually even took it. His vitals are consistent with how they have been over the past two days. BMP less than 24 hours ago which did not show significant metabolic derangement. I recommend anti-motility agent PRN and stool studies can be obtained if there is ongoing concern for c-diff.    Virgel Manifold, MD 03/21/15 [phone]

## 2015-03-21 NOTE — Progress Notes (Signed)
CBG=258. Pt has vomited x2 and had diarrhea x2. Denies fatigue, weakness, stomach pain, confusion. On call provider updated with pt's status. Will continue to encourage fluids. Q15 min safety checks maintained. Will continue to monitor pt.

## 2015-03-21 NOTE — ED Notes (Signed)
Bed: WA21 Expected date:  Expected time:  Means of arrival:  Comments: 

## 2015-03-21 NOTE — ED Notes (Signed)
Bed: JG28 Expected date:  Expected time:  Means of arrival:  Comments: EMS 51M IVC ?Cdiff

## 2015-03-21 NOTE — Progress Notes (Signed)
D: Pt denies SI/HI/AVH. Pt sleep during shift, pt up to get medication, but back to sleep.   A: Pt was offered support and encouragement. Pt was given scheduled medications. Pt was encourage to attend groups. Q 15 minute checks were done for safety.   R: Pt is taking medication. Pt has no complaints.Pt receptive to treatment and safety maintained on unit.

## 2015-03-22 LAB — GLUCOSE, CAPILLARY
GLUCOSE-CAPILLARY: 310 mg/dL — AB (ref 70–99)
Glucose-Capillary: 281 mg/dL — ABNORMAL HIGH (ref 70–99)
Glucose-Capillary: 242 mg/dL — ABNORMAL HIGH (ref 70–99)
Glucose-Capillary: 453 mg/dL — ABNORMAL HIGH (ref 70–99)

## 2015-03-22 MED ORDER — INSULIN ASPART 100 UNIT/ML ~~LOC~~ SOLN
10.0000 [IU] | Freq: Three times a day (TID) | SUBCUTANEOUS | Status: DC
  Administered 2015-03-23 – 2015-03-25 (×7): 10 [IU] via SUBCUTANEOUS

## 2015-03-22 MED ORDER — DIPHENHYDRAMINE HCL 50 MG/ML IJ SOLN
50.0000 mg | Freq: Once | INTRAMUSCULAR | Status: DC
  Filled 2015-03-22 (×2): qty 1

## 2015-03-22 MED ORDER — ZIPRASIDONE MESYLATE 20 MG IM SOLR
20.0000 mg | Freq: Once | INTRAMUSCULAR | Status: AC
  Administered 2015-03-22: 20 mg via INTRAMUSCULAR
  Filled 2015-03-22: qty 20

## 2015-03-22 MED ORDER — DOUBLE ANTIBIOTIC 500-10000 UNIT/GM EX OINT
TOPICAL_OINTMENT | Freq: Two times a day (BID) | CUTANEOUS | Status: DC
  Administered 2015-03-22 – 2015-03-25 (×4): via TOPICAL
  Filled 2015-03-22: qty 28.35

## 2015-03-22 MED ORDER — INSULIN ASPART 100 UNIT/ML ~~LOC~~ SOLN: 10.0000 [IU] | Freq: Once | SUBCUTANEOUS | Status: DC

## 2015-03-22 MED ORDER — LORAZEPAM 1 MG PO TABS
2.0000 mg | ORAL_TABLET | Freq: Once | ORAL | Status: AC
  Administered 2015-03-22: 2 mg via ORAL

## 2015-03-22 MED ORDER — DIPHENHYDRAMINE HCL 50 MG/ML IJ SOLN
50.0000 mg | Freq: Once | INTRAMUSCULAR | Status: AC
  Administered 2015-03-22: 50 mg via INTRAMUSCULAR
  Filled 2015-03-22: qty 1

## 2015-03-22 MED ORDER — ZIPRASIDONE MESYLATE 20 MG IM SOLR
20.0000 mg | Freq: Once | INTRAMUSCULAR | Status: DC
  Filled 2015-03-22: qty 20

## 2015-03-22 MED ORDER — OLANZAPINE 5 MG PO TBDP
5.0000 mg | ORAL_TABLET | Freq: Once | ORAL | Status: AC
Start: 2015-03-22 — End: 2015-03-22
  Administered 2015-03-22: 5 mg via ORAL

## 2015-03-22 MED ORDER — INSULIN ASPART 100 UNIT/ML ~~LOC~~ SOLN
15.0000 [IU] | Freq: Once | SUBCUTANEOUS | Status: AC
  Administered 2015-03-22: 15 [IU] via SUBCUTANEOUS

## 2015-03-22 MED ORDER — LORAZEPAM 1 MG PO TABS
1.0000 mg | ORAL_TABLET | Freq: Once | ORAL | Status: AC
Start: 2015-03-22 — End: 2015-03-22
  Administered 2015-03-22: 1 mg via ORAL

## 2015-03-22 MED ORDER — ZIPRASIDONE MESYLATE 20 MG IM SOLR
20.0000 mg | Freq: Once | INTRAMUSCULAR | Status: AC
Start: 2015-03-22 — End: 2015-03-22
  Filled 2015-03-22: qty 20

## 2015-03-22 MED ORDER — LORAZEPAM 2 MG/ML IJ SOLN
2.0000 mg | Freq: Once | INTRAMUSCULAR | Status: AC
  Administered 2015-03-22: 2 mg via INTRAMUSCULAR
  Filled 2015-03-22: qty 1

## 2015-03-22 MED ORDER — INSULIN DETEMIR 100 UNIT/ML ~~LOC~~ SOLN
45.0000 [IU] | Freq: Every day | SUBCUTANEOUS | Status: DC
  Administered 2015-03-22: 45 [IU] via SUBCUTANEOUS

## 2015-03-22 MED ORDER — ZIPRASIDONE MESYLATE 20 MG IM SOLR
INTRAMUSCULAR | Status: AC
  Administered 2015-03-22: 03:00:00
  Filled 2015-03-22: qty 20

## 2015-03-22 NOTE — Progress Notes (Signed)
[facility] Group Notes:  (Nursing/MHT/Case Management/Adjunct)  Date:  03/22/2015  Time:  8:55 PM  Type of Therapy:  Psychoeducational Skills  Participation Level:  Active  Participation Quality:  Intrusive and Monopolizing  Affect:  Angry and Excited  Cognitive:  Disorganized  Insight:  Limited  Engagement in Group:  Distracting and Monopolizing  Modes of Intervention:  Education  Summary of Progress/Problems: Patient described his day as having been "satisfactory" and then discussed a number of complaints. He indicated that he felt as if he was mistreated by the staff today and that he needed to know to whom he should contact for a complaint, "the hierarchy around here". The patient had to be redirected for speaking out of order to his other peers and repeatedly asked about obtaining a night time snack. In terms of the theme of the day, he had difficulty identifying what he could do to maintain his "wellness".   Hazle Coca S 03/22/2015, 8:55 PM
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
## 2015-03-22 NOTE — Progress Notes (Signed)
Pt got up at 200 demanding snacks. Pt stated he wanted some ice cream, pt stated he wanted some coffee. Pt was informed that his blood sugar was 188 earlier. Pt was demanding disrespectful.

## 2015-03-22 NOTE — Progress Notes (Signed)
Patient ID: [name], male   DOB: 11/01/1986, 29 y.o.   MRN: [id number] D: Client up and down most of the shift, asking repeatedly for food and medications. Client is manipulative and makes comments belittling staff. "I'm going to get everybody fired" "I want to go home, I have a safe place to go and want to sign out AMA" "I want medications, pull the code button so they can give me more medication" "give me PRN, that's as needed medications" Clients beats on the door, "help, help" Client also attempts to sit on the dirty linen hamper. A: Writer continuously redirects client, Engineer, materials called at one point for support. Staff removed dirty linen hamper, client then lays on the floor yelling "medical practice suit" Staff will monitor for safety q8min. R: Client is safe on the unit, attended group.

## 2015-03-22 NOTE — Progress Notes (Signed)
Psychoeducational Group Note  Date:  03/22/2015 Time:  1139  Group Topic/Focus:  Developing a Wellness Toolbox:   The focus of this group is to help patients develop a "wellness toolbox" with skills and strategies to promote recovery upon discharge.  Participation Level: Did Not Attend  Participation Quality:  Not Applicable  Affect:  Not Applicable  Cognitive:  Not Applicable  Insight:  Not Applicable  Engagement in Group: Not Applicable  Additional Comments:  Pt was medicated and asleep and as a result could not attend group.  Janyth Riera E 03/22/2015, 1:20 PM

## 2015-03-22 NOTE — Progress Notes (Signed)
Took EKG as requested by NP following Geodon injection . Showed NP and placed in chart. Safety maintained.

## 2015-03-22 NOTE — Progress Notes (Signed)
Inpatient Diabetes Program Recommendations  AACE/ADA: New Consensus Statement on Inpatient Glycemic Control (2013)  Target Ranges:  Prepandial:   less than 140 mg/dL      Peak postprandial:   less than 180 mg/dL (1-2 hours)      Critically ill patients:  140 - 180 mg/dL     Results for [name], [name] (MRN [id number]) as of 03/22/2015 10:55  Ref. Range 03/21/2015 07:44 03/21/2015 12:57 03/21/2015 16:32 03/21/2015 22:06  Glucose-Capillary Latest Range: 70-99 mg/dL 163 (H) 845 (H) 364 (H) 188 (H)    Results for [name], [name] (MRN [id number]) as of 03/22/2015 10:55  Ref. Range 03/22/2015 06:45  Glucose-Capillary Latest Range: 70-99 mg/dL 825 (H)     Home DM Meds: Levemir 35 units daily       Novolog SSI (not sure if patient has been taking either insulin at home)   Current Orders: Levemir 40 units QHS      Novolog Sensitive SSI tid      Novolog 5 units tidwc     MD- Please consider the following in-hospital insulin adjustments:  1. Increase Levemir to 45 units QHS  2. Increase SSI to Moderate scale tid ac + HS (currently ordered as Sensitive scale)  3. Increase Novolog Meal Coverage to 7 units tid with meals (currently ordered as 5 units tidwc)     Will follow Ambrose Finland RN, MSN, CDE Diabetes Coordinator Inpatient Diabetes Program Team Pager: [phone] (8a-5p)
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
## 2015-03-22 NOTE — Progress Notes (Addendum)
CBG 453. Reported to Dr. Vanessa Barbara and NP. Received order to give 15 units total. Given as ordered. Safety maintained.

## 2015-03-22 NOTE — CIRT (Signed)
Post Seclusion/Restraint Episode Mini Treatment Team  Date of Seclusion or Restraint Episode:   Today's Date:  03/22/2015  List of Patient Triggers/Skill Deficits:  Review of Medications:  Current Facility-Administered Medications  Medication Dose Route Frequency Provider Last Rate Last Dose  . acetaminophen (TYLENOL) tablet 650 mg  650 mg Oral Q6H PRN Earney Navy, NP      . alum & mag hydroxide-simeth (MAALOX/MYLANTA) 200-200-20 MG/5ML suspension 30 mL  30 mL Oral Q4H PRN Earney Navy, NP   30 mL at 03/21/15 0255  . benztropine (COGENTIN) tablet 0.5 mg  0.5 mg Oral BID Jomarie Longs, MD   0.5 mg at 03/21/15 1647  . divalproex (DEPAKOTE) DR tablet 500 mg  500 mg Oral Q12H Saramma Eappen, MD   500 mg at 03/21/15 2212  . hydrOXYzine (ATARAX/VISTARIL) tablet 25 mg  25 mg Oral TID PRN Adonis Brook, NP   25 mg at 03/21/15 1647  . insulin aspart (novoLOG) injection 0-9 Units  0-9 Units Subcutaneous TID WC Raeford Razor, MD   9 Units at 03/21/15 1649  . insulin aspart (novoLOG) injection 10 Units  10 Units Subcutaneous TID WC Jeralyn Bennett, MD   10 Units at 03/21/15 1651  . insulin aspart (novoLOG) injection 5 Units  5 Units Subcutaneous TID WC Belkys A Regalado, MD   5 Units at 03/21/15 1651  . insulin detemir (LEVEMIR) injection 40 Units  40 Units Subcutaneous QHS Belkys A Regalado, MD   40 Units at 03/21/15 2212  . loperamide (IMODIUM) capsule 4 mg  4 mg Oral Q6H PRN Raeford Razor, MD      . LORazepam (ATIVAN) tablet 1 mg  1 mg Oral Q6H PRN Jomarie Longs, MD   1 mg at 03/22/15 0158  . magnesium hydroxide (MILK OF MAGNESIA) suspension 30 mL  30 mL Oral Daily PRN Earney Navy, NP      . nicotine polacrilex (NICORETTE) gum 2 mg  2 mg Oral PRN Jomarie Longs, MD   2 mg at 03/20/15 2102  . OLANZapine zydis (ZYPREXA) disintegrating tablet 5 mg  5 mg Oral Q8H PRN Jomarie Longs, MD   5 mg at 03/22/15 0158  . polymixin-bacitracin (POLYSPORIN) ointment   Topical BID Jomarie Longs,  MD      . risperiDONE (RISPERDAL M-TABS) disintegrating tablet 1 mg  1 mg Oral BID Jomarie Longs, MD   1 mg at 03/21/15 1647  . traZODone (DESYREL) tablet 50 mg  50 mg Oral QHS PRN Adonis Brook, NP   50 mg at 03/20/15 2102  . ziprasidone (GEODON) injection 20 mg  20 mg Intramuscular Once Worthy Flank, NP        Compliant with Medications:  Yes  Need for Medication Adjustment:  Yes  Plan to Prevent Future Episodes of Seclusion and Restraint:   Staff Present:  Physician   Nurse Practitioner/PA   Pharmacist   Nurse Jacques Navy, Gretta Arab,   Nurse Londell Moh  Nurse   MHT/NT Melton Krebs, Kathy Breach  Counselor/Case Manager   Department Leadership [facility]  Security Reatha Harps, Everardo Pacific Denaye 03/22/2015, 3:20 AM

## 2015-03-22 NOTE — [facility] Group Notes (Signed)
[facility] LCSW Aftercare Discharge Planning Group Note   03/22/2015 11:09 AM  Participation Quality:  Sleeping.  Did not attend    [place], Baldo Daub

## 2015-03-22 NOTE — Progress Notes (Signed)
[facility] MD Progress Note  03/22/2015 3:36 PM [name]  MRN:  [id number]  Subjective: [name] reports, "I was sent to the [facility] for nothing. My mood is decent. I don't have diabetes, diabetes is not real. All I need is a lot of food. Give me some food. I can go without insulin for a long time. I sleep good"  Principal Problem: Schizophrenia  Diagnosis:   Patient Active Problem List   Diagnosis Date Noted  . Schizophrenia [F20.9] 03/20/2015  . HCAP (healthcare-associated pneumonia) [J18.9] 01/23/2015  . Hyperglycemia [R73.9] 12/21/2014  . DKA (diabetic ketoacidoses) [E13.10] 11/18/2014  . Hip pain, bilateral [M25.551, M25.552] 11/18/2014  . DKA, type 1 [E10.10] 06/17/2012  . Hyponatremia [E87.1] 06/17/2012  . Dehydration [E86.0] 06/17/2012  . Diabetes type 1, uncontrolled [E10.65] 02/19/2012   Total Time spent with patient: 35 minutes  Past Medical History:  Past Medical History  Diagnosis Date  . Diabetes mellitus   . Depression   . Schizo affective schizophrenia   . Polysubstance abuse   . Scoliosis   . Chronic pain   . Noncompliance with medication regimen   . DKA (diabetic ketoacidoses) 11/18/2014  . GERD (gastroesophageal reflux disease)   . Arthritis     "both hips" (12/21/2014)   History reviewed. No pertinent past surgical history.  Family History:  Family History  Problem Relation Age of Onset  . Diabetes Mother    Social History:  History  Alcohol Use  . Yes    Comment: 12/21/2014 "I drink on a rare occasion"     History  Drug Use  . Yes  . Special: Marijuana    Comment: 12/21/2014 "I use marijuana on a rare occasion", last used marijuana 01/30/2015    History   Social History  . Marital Status: Married    Spouse Name: N/A  . Number of Children: N/A  . Years of Education: N/A   Social History Main Topics  . Smoking status: Current Some Day Smoker -- 1.00 packs/day for 7 years    Types: Cigarettes  . Smokeless tobacco: Never Used   . Alcohol Use: Yes     Comment: 12/21/2014 "I drink on a rare occasion"  . Drug Use: Yes    Special: Marijuana     Comment: 12/21/2014 "I use marijuana on a rare occasion", last used marijuana 01/30/2015  . Sexual Activity: Yes   Other Topics Concern  . None   Social History Narrative   ** Merged History Encounter **       Additional History:    Sleep: Fair  Appetite:  Good  Assessment: Patient is assessed, chart reviewed. [name] is a 61 year gentleman with Schizophrenia. He is also battling brittle diabetes, insulin dependenct. He is visible on unit, participates in some group sessions when not agitated or being disruptive. He has made frequent visit to the ER for elevated blood sugar. A diabetes coordinator has been consulted as well as primary care hospitalist to evaluate his diabetes and recommend appropriate treatment. He is currently receiving treatment for mood stabilization.   Musculoskeletal: Strength & Muscle Tone: within normal limits Gait & Station: normal Patient leans: N/A  Psychiatric Specialty Exam: Physical Exam  Psychiatric: Thought content normal. His mood appears anxious. His affect is labile and inappropriate. His affect is not angry and not blunt. His speech is rapid and/or pressured. He is agitated, aggressive, hyperactive and combative. Cognition and memory are impaired. He expresses impulsivity and inappropriate judgment. He does not exhibit  a depressed mood.    Review of Systems  Constitutional: Negative.   HENT: Negative.   Eyes: Negative.   Cardiovascular: Negative.   Gastrointestinal: Negative.   Genitourinary: Negative.   Musculoskeletal: Negative.   Skin: Negative.   Neurological: Negative.   Endo/Heme/Allergies: Negative.   Psychiatric/Behavioral: Positive for hallucinations. Negative for depression, suicidal ideas, memory loss and substance abuse. The patient is nervous/anxious and has insomnia.     Blood pressure 104/76, pulse 112,  temperature 97.8 F (36.6 C), temperature source Oral, resp. rate 20, height 6\' 1"  (1.854 m), weight 86 kg (189 lb 9.5 oz), SpO2 99 %.Body mass index is 25.02 kg/(m^2).  General Appearance: Disheveled  Eye Solicitor::  Fair  Speech:  Pressured  Volume:  Increased  Mood:  Irritable and agitated  Affect:  Appropriate and Restricted  Thought Process:  Disorganized, Tangential and illogical  Orientation:  Other:  Oriented to self  Thought Content:  Delusions and disorganized  Suicidal Thoughts:  No  Homicidal Thoughts:  No  Memory:  Impaired  Judgement:  Impaired  Insight:  Not present  Psychomotor Activity:  Restlessness and agitated  Concentration:  Poor  Recall:  Poor  Fund of Knowledge:Poor  Language: Fair  Akathisia:  No  Handed:  Right  AIMS (if indicated):     Assets:  Desire for Improvement  ADL's:  Impaired  Cognition: Impaired,  Mild  Sleep:  Number of Hours: 5.5   Current Medications: Current Facility-Administered Medications  Medication Dose Route Frequency Provider Last Rate Last Dose  . acetaminophen (TYLENOL) tablet 650 mg  650 mg Oral Q6H PRN Earney Navy, NP      . alum & mag hydroxide-simeth (MAALOX/MYLANTA) 200-200-20 MG/5ML suspension 30 mL  30 mL Oral Q4H PRN Earney Navy, NP   30 mL at 03/21/15 0255  . benztropine (COGENTIN) tablet 0.5 mg  0.5 mg Oral BID Jomarie Longs, MD   0.5 mg at 03/22/15 0803  . divalproex (DEPAKOTE) DR tablet 500 mg  500 mg Oral Q12H Jomarie Longs, MD   500 mg at 03/22/15 0803  . hydrOXYzine (ATARAX/VISTARIL) tablet 25 mg  25 mg Oral TID PRN Adonis Brook, NP   25 mg at 03/22/15 1440  . insulin aspart (novoLOG) injection 0-9 Units  0-9 Units Subcutaneous TID WC Raeford Razor, MD   3 Units at 03/22/15 1228  . insulin aspart (novoLOG) injection 5 Units  5 Units Subcutaneous TID WC Belkys A Regalado, MD   5 Units at 03/22/15 1228  . insulin detemir (LEVEMIR) injection 40 Units  40 Units Subcutaneous QHS Belkys A Regalado, MD    40 Units at 03/21/15 2212  . loperamide (IMODIUM) capsule 4 mg  4 mg Oral Q6H PRN Raeford Razor, MD      . LORazepam (ATIVAN) tablet 1 mg  1 mg Oral Q6H PRN Jomarie Longs, MD   1 mg at 03/22/15 1019  . magnesium hydroxide (MILK OF MAGNESIA) suspension 30 mL  30 mL Oral Daily PRN Earney Navy, NP      . nicotine polacrilex (NICORETTE) gum 2 mg  2 mg Oral PRN Jomarie Longs, MD   2 mg at 03/20/15 2102  . OLANZapine zydis (ZYPREXA) disintegrating tablet 5 mg  5 mg Oral Q8H PRN Jomarie Longs, MD   5 mg at 03/22/15 1019  . polymixin-bacitracin (POLYSPORIN) ointment   Topical BID Saramma Eappen, MD      . risperiDONE (RISPERDAL M-TABS) disintegrating tablet 1 mg  1 mg Oral BID  Jomarie Longs, MD   1 mg at 03/22/15 0803  . traZODone (DESYREL) tablet 50 mg  50 mg Oral QHS PRN Adonis Brook, NP   50 mg at 03/20/15 2102  . ziprasidone (GEODON) injection 20 mg  20 mg Intramuscular Once Worthy Flank, NP       Lab Results:  Results for orders placed or performed during the hospital encounter of 03/19/15 (from the past 48 hour(s))  Glucose, capillary     Status: Abnormal   Collection Time: 03/20/15  5:36 PM  Result Value Ref Range   Glucose-Capillary 212 (H) 70 - 99 mg/dL   Comment 1 Notify RN   Glucose, capillary     Status: Abnormal   Collection Time: 03/20/15  9:33 PM  Result Value Ref Range   Glucose-Capillary 522 (H) 70 - 99 mg/dL   Comment 1 Notify RN   Glucose, capillary     Status: Abnormal   Collection Time: 03/20/15 10:59 PM  Result Value Ref Range   Glucose-Capillary 510 (H) 70 - 99 mg/dL   Comment 1 Notify RN   Glucose, capillary     Status: Abnormal   Collection Time: 03/21/15 12:34 AM  Result Value Ref Range   Glucose-Capillary 258 (H) 70 - 99 mg/dL  POC CBG, ED     Status: Abnormal   Collection Time: 03/21/15  7:44 AM  Result Value Ref Range   Glucose-Capillary 190 (H) 70 - 99 mg/dL   Comment 1 Notify RN    Comment 2 Document in Chart   Clostridium Difficile by PCR      Status: None   Collection Time: 03/21/15  8:13 AM  Result Value Ref Range   C difficile by pcr NEGATIVE NEGATIVE  CBG monitoring, ED     Status: Abnormal   Collection Time: 03/21/15 12:57 PM  Result Value Ref Range   Glucose-Capillary 237 (H) 70 - 99 mg/dL   Comment 1 Notify RN    Comment 2 Document in Chart   Glucose, capillary     Status: Abnormal   Collection Time: 03/21/15  4:32 PM  Result Value Ref Range   Glucose-Capillary 465 (H) 70 - 99 mg/dL  Glucose, capillary     Status: Abnormal   Collection Time: 03/21/15 10:06 PM  Result Value Ref Range   Glucose-Capillary 188 (H) 70 - 99 mg/dL  Glucose, capillary     Status: Abnormal   Collection Time: 03/22/15  6:45 AM  Result Value Ref Range   Glucose-Capillary 281 (H) 70 - 99 mg/dL  Glucose, capillary     Status: Abnormal   Collection Time: 03/22/15 12:16 PM  Result Value Ref Range   Glucose-Capillary 242 (H) 70 - 99 mg/dL    Physical Findings: AIMS: Facial and Oral Movements Muscles of Facial Expression: None, normal Lips and Perioral Area: None, normal Jaw: None, normal Tongue: None, normal,Extremity Movements Upper (arms, wrists, hands, fingers): None, normal Lower (legs, knees, ankles, toes): None, normal, Trunk Movements Neck, shoulders, hips: None, normal, Overall Severity Severity of abnormal movements (highest score from questions above): None, normal Incapacitation due to abnormal movements: None, normal Patient's awareness of abnormal movements (rate only patient's report): No Awareness, Dental Status Current problems with teeth and/or dentures?: No Does patient usually wear dentures?: No  CIWA:  CIWA-Ar Total: 5 COWS:     Treatment Plan Summary: Daily contact with patient to assess and evaluate symptoms and progress in treatment and Medication management: 1. Continue crisis management, mood stabilization. 2. Continue  current medication management to reduce current symptoms to base line and improve the   patient's overall level of functioning; Risperdal M-tabs 1 mg bid for mood control, Olanzapine Zydis 5 mg Q 8 hours prn for agitation, disruptive behavior, Depakote DR 500 mg for mood stabilization, Cogentin 0.5 mg for prevention of EPS, Trazodone 50 mg q hs for insomnia. 3. Treat health problems as indicated; resumed all pertinent home medications for the other medical issues.  4. Develop treatment plan to enhance medication adeherance upon discharge and decrease the need for  readmission. 5. Psycho-social education regarding relapse prevention and self care.  Medical Decision Making:  Established Problem, Stable/Improving (1), Review of Psycho-Social Stressors (1), Review or order clinical lab tests (1), Review and summation of old records (2), Review of Last Therapy Session (1), Review of Medication Regimen & Side Effects (2) and Review of New Medication or Change in Dosage (2)  Sanjuana Kava, PMHNP-BC 03/22/2015, 3:36 PM  Agree with Progress Note As above

## 2015-03-22 NOTE — Tx Team (Signed)
  Interdisciplinary Treatment Plan Update   Date Reviewed:  03/22/2015  Time Reviewed:  8:52 AM  Progress in Treatment:   Attending groups: Yes Participating in groups: Yes Taking medication as prescribed: Yes  Tolerating medication: Yes Family/Significant other contact made: Yes  Patient understands diagnosis: Yes  Discussing patient identified problems/goals with staff: Yes  See initial care plan Medical problems stabilized or resolved: Yes Denies suicidal/homicidal ideation: Yes  In tx team Patient has not harmed self or others: Yes  For review of initial/current patient goals, please see plan of care.  Estimated Length of Stay:  4-5 days  Reason for Continuation of Hospitalization: Medication stabilization Other; describe Mood instability  New Problems/Goals identified:  N/A  Discharge Plan or Barriers:  return home, follow up outpt   Additional Comments:  [name] is a 29 y.o. c male with a PMHx of DM,schizophrenia, polysubstance abuse, chronic pain, scoliosis, noncompliance, GERD, and b/l hip arthritis, who presented to the [facility] accompanied by law enforcement who were called out for bizarre behavior. Per initial notes in EHR , patient was brought in for abnormal behavior . [place] in [place] Patient otherwise was unable to provide further details while in ED.  Patient also has uncontrolled DM , had DKA in the past , his CBG was >450 in the ED , he required management for the same. After coming to [facility] , patient's CBG continued to be elevated . Pt otherwise noted to have slurred speech ( unknown if this is baseline). Patient was oriented to person , place and time. Patient is a very limited historian and hence could not give much details about his past hx.  Depakote, Risperdal trial along with heavy duty PRN's due to agitation, verbally assaultive behavior  CIRTed early this  morning.  Attendees:  Signature: Ivin Booty, MD 03/22/2015 8:52 AM   Signature: Richelle Ito, LCSW 03/22/2015 8:52 AM  Signature: Fransisca Kaufmann, NP 03/22/2015 8:52 AM  Signature: Waynetta Sandy, RN 03/22/2015 8:52 AM  Signature:  03/22/2015 8:52 AM  Signature:  03/22/2015 8:52 AM  Signature:   03/22/2015 8:52 AM  Signature:    Signature:    Signature:    Signature:    Signature:    Signature:      Scribe for Treatment Team:   Richelle Ito, LCSW  03/22/2015 8:52 AM

## 2015-03-22 NOTE — Progress Notes (Signed)
Pt has been agitated and anxious on the unit this morning. Pt requested diet Gingerale and was given as requested. Pt then requested another soda and asked this writer to go off the unit and get him a soda. Attempted to explain that soda is not good for diabetes and offered a pitcher of water. Pt took the water and was irritable that his request was not granted. Pt was given prn medication for anxiety and agitation with no decrease after 30 minutes. He continued to escalate as other pt's were allowed off the unit. Staff attempted to de-escalate. Pt told Clinical research associate that she was fired because of wearing white. Pt is tangential in speech and difficult to understand at times. Geodon IM given by another RN as pt did not respond well to white uniform at this time. Safety maintained on the unit.

## 2015-03-22 NOTE — Progress Notes (Signed)
[facility] PROGRESS NOTE  [name] PFX:[id number] DOB: 1986/11/10 DOA: 03/19/2015 PCP: No primary care provider on file.  Assessment/Plan: 1. Insulin dependent diabetes mellitus -Patient presenting to the emergency room with hyperglycemia having blood sugars in the 500s. Basic metabolic panel did not show an anion gap metabolic acidosis -His blood sugars have increased in the last 24 hours, will increase his Lantus to 45 units [place] q hs, and novolog to 10 units.  2.  Diarrhea -resolving  Code Status: Full code Family Communication: Family not present Disposition Plan:     HPI/Subjective: Patient is a 29 year old gentleman with a past medical history of insulin-dependent diabetes mellitus, schizophrenia, admitted to behavior health for treatment of acute psychosis, was sent to the emergency department for treatment of hyperglycemia. He was found to have a blood sugar of 510. BMP did not reveal diabetic ketoacidosis. He was restarted on his basal insulin with Levemir 40 units subcutaneous daily at bedtime as well as mealtime coverage with NovoLog units subcutaneous 3 times a day. At sugars overall improving.  Objective: Filed Vitals:   03/22/15 1222  BP: 104/76  Pulse: 112  Temp:   Resp:    No intake or output data in the 24 hours ending 03/22/15 1733 Filed Weights   03/19/15 1830  Weight: 86 kg (189 lb 9.5 oz)    Exam:   General:  Patient is in no acute distress, reports ongoing diarrhea.  Cardiovascular: Regular rate and rhythm normal S1-S2  Respiratory: Clear to auscultation bilaterally  Abdomen: Has a generalized mild abdominal pain to palpation, no rebound tenderness or guarding, positive bowel sounds  Musculoskeletal: No edema  Data Reviewed: Basic Metabolic Panel:  Recent Labs Lab 03/18/15 1836 03/20/15 0630 03/20/15 1302  NA 135 138 136  K 4.4 3.7 4.5  CL 94* 99 101  CO2 26 27 26   GLUCOSE 452* 274* 379*  BUN 16 22 23   CREATININE 0.70  0.64 0.99  CALCIUM 9.6 8.4 8.7   Liver Function Tests:  Recent Labs Lab 03/18/15 1836 03/20/15 0630  AST 23 14  ALT 19 15  ALKPHOS 86 65  BILITOT 0.8 <0.1*  PROT 7.0 5.6*  ALBUMIN 4.5 3.5   No results for input(s): LIPASE, AMYLASE in the last 168 hours. No results for input(s): AMMONIA in the last 168 hours. CBC:  Recent Labs Lab 03/18/15 1836  WBC 7.6  HGB 14.4  HCT 43.1  MCV 86.5  PLT 232   Cardiac Enzymes: No results for input(s): CKTOTAL, CKMB, CKMBINDEX, TROPONINI in the last 168 hours. BNP (last 3 results) No results for input(s): BNP in the last 8760 hours.  ProBNP (last 3 results) No results for input(s): PROBNP in the last 8760 hours.  CBG:  Recent Labs Lab 03/21/15 1257 03/21/15 1632 03/21/15 2206 03/22/15 0645 03/22/15 1216  GLUCAP 237* 465* 188* 281* 242*    Recent Results (from the past 240 hour(s))  Clostridium Difficile by PCR     Status: None   Collection Time: 03/21/15  8:13 AM  Result Value Ref Range Status   C difficile by pcr NEGATIVE NEGATIVE Final     Studies: No results found.  Scheduled Meds: . benztropine  0.5 mg Oral BID  . divalproex  500 mg Oral Q12H  . insulin aspart  0-9 Units Subcutaneous TID WC  . [START ON 03/23/2015] insulin aspart  10 Units Subcutaneous TID WC  . insulin aspart  10 Units Subcutaneous Once  . insulin detemir  45 Units Subcutaneous  QHS  . polymixin-bacitracin   Topical BID  . risperiDONE  1 mg Oral BID  . ziprasidone  20 mg Intramuscular Once   Continuous Infusions:   Principal Problem:   Schizophrenia Active Problems:   Diabetes type 1, uncontrolled    Time spent: 25 min    Jeralyn Bennett  [facility] Pager [phone]. If 7PM-7AM, please contact night-coverage at [email or web address], password [facility] 03/22/2015, 5:33 PM  LOS: 3 days

## 2015-03-23 ENCOUNTER — Other Ambulatory Visit (HOSPITAL_COMMUNITY): Payer: Self-pay | Admitting: Psychiatry

## 2015-03-23 LAB — GI PATHOGEN PANEL BY PCR, STOOL
C difficile toxin A/B: NOT DETECTED
Campylobacter by PCR: NOT DETECTED
Cryptosporidium by PCR: NOT DETECTED
E coli (ETEC) LT/ST: NOT DETECTED
E coli (STEC): NOT DETECTED
E coli 0157 by PCR: NOT DETECTED
G lamblia by PCR: NOT DETECTED
Norovirus GI/GII: NOT DETECTED
Rotavirus A by PCR: NOT DETECTED
Salmonella by PCR: NOT DETECTED
Shigella by PCR: NOT DETECTED

## 2015-03-23 LAB — GLUCOSE, CAPILLARY
GLUCOSE-CAPILLARY: 135 mg/dL — AB (ref 70–99)
GLUCOSE-CAPILLARY: 246 mg/dL — AB (ref 70–99)
Glucose-Capillary: 230 mg/dL — ABNORMAL HIGH (ref 70–99)

## 2015-03-23 LAB — VALPROIC ACID LEVEL: Valproic Acid Lvl: 52.7 ug/mL (ref 50.0–100.0)

## 2015-03-23 MED ORDER — CHLORPROMAZINE HCL 25 MG PO TABS
25.0000 mg | ORAL_TABLET | Freq: Three times a day (TID) | ORAL | Status: DC | PRN
  Administered 2015-03-24: 25 mg via ORAL
  Filled 2015-03-23: qty 1

## 2015-03-23 MED ORDER — HALOPERIDOL LACTATE 5 MG/ML IJ SOLN
5.0000 mg | Freq: Four times a day (QID) | INTRAMUSCULAR | Status: DC | PRN
  Administered 2015-03-23: 5 mg via INTRAMUSCULAR
  Filled 2015-03-23: qty 1

## 2015-03-23 MED ORDER — DIPHENHYDRAMINE HCL 50 MG/ML IJ SOLN: 50.0000 mg | Freq: Three times a day (TID) | INTRAMUSCULAR | Status: DC | PRN

## 2015-03-23 MED ORDER — TRAZODONE HCL 150 MG PO TABS
150.0000 mg | ORAL_TABLET | Freq: Every day | ORAL | Status: DC
  Administered 2015-03-23 – 2015-03-24 (×2): 150 mg via ORAL
  Filled 2015-03-23 (×4): qty 1

## 2015-03-23 MED ORDER — RISPERIDONE 2 MG PO TBDP
2.0000 mg | ORAL_TABLET | Freq: Two times a day (BID) | ORAL | Status: DC
  Administered 2015-03-23 – 2015-03-24 (×2): 2 mg via ORAL
  Filled 2015-03-23 (×4): qty 1

## 2015-03-23 MED ORDER — DIPHENHYDRAMINE HCL 25 MG PO CAPS
50.0000 mg | ORAL_CAPSULE | Freq: Three times a day (TID) | ORAL | Status: DC | PRN
  Administered 2015-03-24: 50 mg via ORAL
  Filled 2015-03-23: qty 2

## 2015-03-23 MED ORDER — INSULIN DETEMIR 100 UNIT/ML ~~LOC~~ SOLN
48.0000 [IU] | Freq: Every day | SUBCUTANEOUS | Status: DC
  Administered 2015-03-23: 48 [IU] via SUBCUTANEOUS

## 2015-03-23 MED ORDER — CHLORPROMAZINE HCL 25 MG/ML IJ SOLN: 25.0000 mg | Freq: Three times a day (TID) | INTRAMUSCULAR | Status: DC | PRN

## 2015-03-23 MED ORDER — LORAZEPAM 2 MG/ML IJ SOLN
2.0000 mg | Freq: Once | INTRAMUSCULAR | Status: AC
  Administered 2015-03-23: 2 mg via INTRAMUSCULAR
  Filled 2015-03-23: qty 1

## 2015-03-23 NOTE — Progress Notes (Signed)
[facility] PROGRESS NOTE  [name] SNK:[id number] DOB: 03-Apr-1986 DOA: 03/19/2015 PCP: No primary care provider on file.  Assessment/Plan: 1. Insulin dependent diabetes mellitus -Blood sugars improved, fluctuating between 135 and 246 today.  -Would recommend continuing Lantus at 48 untis [place] q daily and giving 10 unts of meal time coverage. -Continue accuchecks q ac and hs.  -Will need close outpatient follow up for management of his DM.    2.  Diarrhea -resolved  Will sign off, please call with any questions.   Code Status: Full code Family Communication: Family not present Disposition Plan:     HPI/Subjective: Patient is a 29 year old gentleman with a past medical history of insulin-dependent diabetes mellitus, schizophrenia, admitted to behavior health for treatment of acute psychosis, was sent to the emergency department for treatment of hyperglycemia. He was found to have a blood sugar of 510. BMP did not reveal diabetic ketoacidosis. He was restarted on his basal insulin with Levemir 40 units subcutaneous daily at bedtime as well as mealtime coverage with NovoLog units subcutaneous 3 times a day. At sugars overall improving.  Objective: Filed Vitals:   03/23/15 0810  BP: 115/75  Pulse: 119  Temp:   Resp:    No intake or output data in the 24 hours ending 03/23/15 1817 Filed Weights   03/19/15 1830  Weight: 86 kg (189 lb 9.5 oz)    Exam:   Data Reviewed: Basic Metabolic Panel:  Recent Labs Lab 03/18/15 1836 03/20/15 0630 03/20/15 1302  NA 135 138 136  K 4.4 3.7 4.5  CL 94* 99 101  CO2 26 27 26   GLUCOSE 452* 274* 379*  BUN 16 22 23   CREATININE 0.70 0.64 0.99  CALCIUM 9.6 8.4 8.7   Liver Function Tests:  Recent Labs Lab 03/18/15 1836 03/20/15 0630  AST 23 14  ALT 19 15  ALKPHOS 86 65  BILITOT 0.8 <0.1*  PROT 7.0 5.6*  ALBUMIN 4.5 3.5   No results for input(s): LIPASE, AMYLASE in the last 168 hours. No results for input(s):  AMMONIA in the last 168 hours. CBC:  Recent Labs Lab 03/18/15 1836  WBC 7.6  HGB 14.4  HCT 43.1  MCV 86.5  PLT 232   Cardiac Enzymes: No results for input(s): CKTOTAL, CKMB, CKMBINDEX, TROPONINI in the last 168 hours. BNP (last 3 results) No results for input(s): BNP in the last 8760 hours.  ProBNP (last 3 results) No results for input(s): PROBNP in the last 8760 hours.  CBG:  Recent Labs Lab 03/22/15 1715 03/22/15 2007 03/23/15 0717 03/23/15 1152 03/23/15 1737  GLUCAP 453* 310* 230* 135* 246*    Recent Results (from the past 240 hour(s))  Clostridium Difficile by PCR     Status: None   Collection Time: 03/21/15  8:13 AM  Result Value Ref Range Status   C difficile by pcr NEGATIVE NEGATIVE Final     Studies: No results found.  Scheduled Meds: . benztropine  0.5 mg Oral BID  . divalproex  500 mg Oral Q12H  . insulin aspart  0-9 Units Subcutaneous TID WC  . insulin aspart  10 Units Subcutaneous TID WC  . insulin detemir  48 Units Subcutaneous QHS  . polymixin-bacitracin   Topical BID  . risperiDONE  2 mg Oral BID  . traZODone  150 mg Oral QHS  . ziprasidone  20 mg Intramuscular Once   Continuous Infusions:   Principal Problem:   Schizophrenia Active Problems:   Diabetes type 1, uncontrolled  Time spent: 25 min    Jeralyn Bennett  [facility] Pager [phone]. If 7PM-7AM, please contact night-coverage at [email or web address], password [facility] 03/23/2015, 6:17 PM  LOS: 4 days

## 2015-03-23 NOTE — Progress Notes (Signed)
[facility] MD Progress Note  03/23/2015 2:06 PM [name]  MRN:  [id number]  Subjective: [name] reports, "I want to go home , this place is making me anxious ."  Objective: Patient seen and chart reviewed.Discussed patient with treatment team. [name] per staff has been very agitated and anxious on the unit ,requiring prn medications . [name] also did not sleep well last night and required prn medications multiple times. [name] was in [name] room sleeping the whole AM . [name] was thereafter seen after  1 pm , having lunch in the day room. [name] continues to appear drowsy , slow speech , [name] speech is Christopher bit slurred at baseline . [name] continues to lack insight in to [name] illness . [name] encouraged to take medications. [name] encouraged to attend groups . [name] did not express any delusions , did not express any SI/HI/AH/VH.     Principal Problem: Schizophrenia  Diagnosis:   Patient Active Problem List   Diagnosis Date Noted  . Schizophrenia [F20.9] 03/20/2015  . HCAP (healthcare-associated pneumonia) [J18.9] 01/23/2015  . Hyperglycemia [R73.9] 12/21/2014  . DKA (diabetic ketoacidoses) [E13.10] 11/18/2014  . Hip pain, bilateral [M25.551, M25.552] 11/18/2014  . DKA, type 1 [E10.10] 06/17/2012  . Hyponatremia [E87.1] 06/17/2012  . Dehydration [E86.0] 06/17/2012  . Diabetes type 1, uncontrolled [E10.65] 02/19/2012   Total Time spent with patient: 30 minutes  Past Medical History:  Past Medical History  Diagnosis Date  . Diabetes mellitus   . Depression   . Schizo affective schizophrenia   . Polysubstance abuse   . Scoliosis   . Chronic pain   . Noncompliance with medication regimen   . DKA (diabetic ketoacidoses) 11/18/2014  . GERD (gastroesophageal reflux disease)   . Arthritis     "both hips" (12/21/2014)   History reviewed. No pertinent past surgical history.  Family History:  Family History  Problem Relation Age of Onset  . Diabetes Mother    Social History:  History  Alcohol Use  . Yes    Comment:  12/21/2014 "I drink on Christopher rare occasion"     History  Drug Use  . Yes  . Special: Marijuana    Comment: 12/21/2014 "I use marijuana on Christopher rare occasion", last used marijuana 01/30/2015    History   Social History  . Marital Status: Married    Spouse Name: N/Christopher  . Number of Children: N/Christopher  . Years of Education: N/Christopher   Social History Main Topics  . Smoking status: Current Some Day Smoker -- 1.00 packs/day for 7 years    Types: Cigarettes  . Smokeless tobacco: Never Used  . Alcohol Use: Yes     Comment: 12/21/2014 "I drink on Christopher rare occasion"  . Drug Use: Yes    Special: Marijuana     Comment: 12/21/2014 "I use marijuana on Christopher rare occasion", last used marijuana 01/30/2015  . Sexual Activity: Yes   Other Topics Concern  . None   Social History Narrative   ** Merged History Encounter **       Additional History:    Sleep: Fair  Appetite:  Good  Musculoskeletal: Strength & Muscle Tone: within normal limits Gait & Station: normal Patient leans: N/Christopher  Psychiatric Specialty Exam: Physical Exam  Psychiatric: Thought content normal. [name] mood appears anxious. [name] affect is labile and inappropriate. [name] affect is not angry and not blunt. [name] speech is rapid and/or pressured. He is agitated, aggressive, hyperactive and combative. Cognition and memory are impaired. He expresses impulsivity  and inappropriate judgment. He does not exhibit Christopher depressed mood.    Review of Systems  Psychiatric/Behavioral: Positive for depression. The patient is nervous/anxious.     Blood pressure 115/75, pulse 119, temperature 97.7 F (36.5 C), temperature source Oral, resp. rate 18, height 6\' 1"  (1.854 m), weight 86 kg (189 lb 9.5 oz), SpO2 99 %.Body mass index is 25.02 kg/(m^2).  General Appearance: Disheveled  Eye ::  Fair  Speech:  Garbled  Volume:  Increased  Mood:  Irritable  Affect:  Labile  Thought Process:  Disorganized  Orientation:  Other:  Oriented to self  Thought Content:  Rumination   Suicidal Thoughts:  No  Homicidal Thoughts:  No  Memory:  Immediate;   Fair Recent;   Fair Remote;   Fair  Judgement:  Impaired  Insight:  Not present  Psychomotor Activity:  Restlessness  Concentration:  Poor  Recall:  Poor  Fund of Knowledge:Poor  Language: Fair  Akathisia:  No  Handed:  Right  AIMS (if indicated):     Assets:  Desire for Improvement  ADL's:  Impaired  Cognition: WNL  Sleep:  Number of Hours: 0.75   Current Medications: Current Facility-Administered Medications  Medication Dose Route Frequency Provider Last Rate Last Dose  . acetaminophen (TYLENOL) tablet 650 mg  650 mg Oral Q6H PRN [id number], NP      . alum & mag hydroxide-simeth (MAALOX/MYLANTA) 200-200-20 MG/5ML suspension 30 mL  30 mL Oral Q4H PRN 06-26-2001, NP   30 mL at 03/21/15 0255  . benztropine (COGENTIN) tablet 0.5 mg  0.5 mg Oral BID 05/21/15, MD   0.5 mg at 03/23/15 05/23/15  . chlorproMAZINE (THORAZINE) tablet 25 mg  25 mg Oral TID PRN 4562, MD       Or  . chlorproMAZINE (THORAZINE) injection 25 mg  25 mg Intramuscular TID PRN Jomarie Longs, MD      . diphenhydrAMINE (BENADRYL) capsule 50 mg  50 mg Oral Q8H PRN Jomarie Longs, MD       Or  . diphenhydrAMINE (BENADRYL) injection 50 mg  50 mg Intramuscular Q8H PRN Shawnte Demarest, MD      . divalproex (DEPAKOTE) DR tablet 500 mg  500 mg Oral Q12H Anthone Prieur, MD   500 mg at 03/23/15 05/23/15  . haloperidol lactate (HALDOL) injection 5 mg  5 mg Intramuscular Q6H PRN 5638, PA-C   5 mg at 03/23/15 0340  . hydrOXYzine (ATARAX/VISTARIL) tablet 25 mg  25 mg Oral TID PRN 05/23/15, NP   25 mg at 03/22/15 1440  . insulin aspart (novoLOG) injection 0-9 Units  0-9 Units Subcutaneous TID WC 05/22/15, MD   1 Units at 03/23/15 1159  . insulin aspart (novoLOG) injection 10 Units  10 Units Subcutaneous TID WC 05/23/15, MD   10 Units at 03/23/15 1200  . insulin detemir (LEVEMIR) injection 45 Units  45 Units  Subcutaneous QHS 05/23/15, MD   45 Units at 03/22/15 2101  . loperamide (IMODIUM) capsule 4 mg  4 mg Oral Q6H PRN 2102, MD      . LORazepam (ATIVAN) tablet 1 mg  1 mg Oral Q6H PRN Raeford Razor, MD   1 mg at 03/22/15 2106  . magnesium hydroxide (MILK OF MAGNESIA) suspension 30 mL  30 mL Oral Daily PRN 2107, NP      . nicotine polacrilex (NICORETTE) gum 2 mg  2 mg Oral PRN Earney Navy, MD  2 mg at 03/20/15 2102  . polymixin-bacitracin (POLYSPORIN) ointment   Topical BID Aiana Nordquist, MD      . risperiDONE (RISPERDAL M-TABS) disintegrating tablet 2 mg  2 mg Oral BID Jomarie Longs, MD      . traZODone (DESYREL) tablet 150 mg  150 mg Oral QHS Shaconda Hajduk, MD      . ziprasidone (GEODON) injection 20 mg  20 mg Intramuscular Once Worthy Flank, NP       Lab Results:  Results for orders placed or performed during the hospital encounter of 03/19/15 (from the past 48 hour(s))  Glucose, capillary     Status: Abnormal   Collection Time: 03/21/15  4:32 PM  Result Value Ref Range   Glucose-Capillary 465 (H) 70 - 99 mg/dL  Glucose, capillary     Status: Abnormal   Collection Time: 03/21/15 10:06 PM  Result Value Ref Range   Glucose-Capillary 188 (H) 70 - 99 mg/dL  Glucose, capillary     Status: Abnormal   Collection Time: 03/22/15  6:45 AM  Result Value Ref Range   Glucose-Capillary 281 (H) 70 - 99 mg/dL  Glucose, capillary     Status: Abnormal   Collection Time: 03/22/15 12:16 PM  Result Value Ref Range   Glucose-Capillary 242 (H) 70 - 99 mg/dL  Glucose, capillary     Status: Abnormal   Collection Time: 03/22/15  5:15 PM  Result Value Ref Range   Glucose-Capillary 453 (H) 70 - 99 mg/dL   Comment 1 Document in Chart    Comment 2 Repeat Test   Glucose, capillary     Status: Abnormal   Collection Time: 03/22/15  8:07 PM  Result Value Ref Range   Glucose-Capillary 310 (H) 70 - 99 mg/dL  Glucose, capillary     Status: Abnormal   Collection Time: 03/23/15   7:17 AM  Result Value Ref Range   Glucose-Capillary 230 (H) 70 - 99 mg/dL  Glucose, capillary     Status: Abnormal   Collection Time: 03/23/15 11:52 AM  Result Value Ref Range   Glucose-Capillary 135 (H) 70 - 99 mg/dL   Comment 1 Notify RN    Comment 2 Document in Chart     Physical Findings: AIMS: Facial and Oral Movements Muscles of Facial Expression: None, normal Lips and Perioral Area: None, normal Jaw: None, normal Tongue: None, normal,Extremity Movements Upper (arms, wrists, hands, fingers): None, normal Lower (legs, knees, ankles, toes): None, normal, Trunk Movements Neck, shoulders, hips: None, normal, Overall Severity Severity of abnormal movements (highest score from questions above): None, normal Incapacitation due to abnormal movements: None, normal Patient's awareness of abnormal movements (rate only patient's report): No Awareness, Dental Status Current problems with teeth and/or dentures?: No Does patient usually wear dentures?: No  CIWA:  CIWA-Ar Total: 8 COWS:     Assessment:  [name] is Christopher 29 year old [name] , with Schizophrenia. He is also battling brittle diabetes, insulin dependenct. [name] continues to need Christopher lot of redirection as well as PRN medications.       Treatment Plan Summary: Daily contact with patient to assess and evaluate symptoms and progress in treatment and Medication management:  Increase Risperdal M-tabs to 2 mg po bid for mood control. Continue Depakote DR 500 mg for mood stabilization. Continue  Cogentin 0.5 mg for prevention of EPS. Increase Trazodone to 150 mg q hs for insomnia. Change Zyprexa prn to Thorazine 25 mg tid prn for agitation/severe anxiety sx. [name] with likely paradoxical effect  to ativan, will try to hold off ,since it does not seem to help him much.   Medical Decision Making:  Review of Psycho-Social Stressors (1), Review or order clinical lab tests (1), Review and summation of old records (2), Review of Last Therapy Session  (1), Review of Medication Regimen & Side Effects (2) and Review of New Medication or Change in Dosage (2)  Yacqub Baston, MD 03/23/2015, 2:06 PM

## 2015-03-23 NOTE — [facility] Group Notes (Signed)
[facility] LCSW Group Therapy  03/23/2015 , 1:12 PM   Type of Therapy:  Group Therapy  Participation Level:Sleeping.  Did not attend  Summary of Progress/Problems: Today's group focused on the term Diagnosis.  Participants were asked to define the term, and then pronounce whether it is a negative, positive or neutral term.  [name] 03/23/2015 , 1:12 PM

## 2015-03-23 NOTE — Progress Notes (Signed)
D: Patient slept majority of morning.  He was up in the milieu before lunch.  He appears lethargic and drowsy.  His speech is slurred and slow.  He has not exhibited any aggressive or intrusive behavior.  He continues to request extra meals.  He denies SI/HI/AVH.   A: Continue to monitor medication management and MD orders. Safety checks completed every 15 minutes per protocol.  Meet 1:1 with patient to discuss concerns and offer encouragement. R: Patient has not needed any redirection today.

## 2015-03-23 NOTE — [facility] Group Notes (Signed)
[facility] Group Notes:  (Nursing/MHT/Case Management/Adjunct)  Patient did not attend 0900 [employer] Nursing student color therapy group. Patient was laying in bed sleeping at this time.

## 2015-03-23 NOTE — Progress Notes (Signed)
Patient came to nurse's station.  He states, "I want my medicine.  I want all that I can get."  When asked what he needed, he stated, "I wanna go to sleep.  I don't wanna be here."  Patient specifically requested ativan.  He was informed that MD did not want him to have ativan because of his lethargy and drowsiness.  He again states, "I'm gonna snap my finger and you will give me ativan."  He demanded to have another nurse.  Explained to patient that very shortly we were going to have a shift change and he would have another nurse.  Offered patient vistaril to which he originally refused.  I explained that was all I had to give him at the moment.  He agreed to take the vistaril stating, "it's not going to work, but I'll take it."

## 2015-03-23 NOTE — Progress Notes (Addendum)
Patient ID: MAJED PELLEGRIN, male   DOB: 09-30-1986, 29 y.o.   MRN: [id number] D: Client visible on the unit, dishelved, demanding a dinner directly after eating a snack. Client remarks "call security, I'm being denied food, security, security"  Client still liable, but can be redirected, seems to like authority figures.  Client reports "I slept about all day" A: Writer encouraged client to shower and comb his hair. Security was called to address client as he became loud, hollering at the exit doors. Limits was set and client was informed that a dinner would be given at a certain time and CBG would be obtained and medications given prior to this time. Staff will monitor q3min for safety. R: client was disgruntled, but eventually calmed down. Client showered and combed hair, clean scrubs were given. Client is safe on the unit, attended group. Client's overall behavior was some better than previous night as he went to bed after receiving the tray and slept over six hours (see Flow sheet). Client received no IM medications tonight.

## 2015-03-23 NOTE — Progress Notes (Signed)
Adult Psychoeducational Group Note  Date:  03/23/2015 Time:  9:00 PM  Group Topic/Focus:  Wrap-Up Group:   The focus of this group is to help patients review their daily goal of treatment and discuss progress on daily workbooks.  Participation Level:  Active  Participation Quality:  Intrusive  Affect:  Labile  Cognitive:  Lacking  Insight: Lacking  Engagement in Group:  Defensive  Modes of Intervention:  Socialization and Support  Additional Comments:  Patient attended and participated in group tonight,however, had to be redirected several time due to him interrupting group. He reports having a good day. He eat lots of food and sleep.   Lita Mains [facility] 03/23/2015, 9:00 PM

## 2015-03-24 LAB — HEMOGLOBIN A1C
Hgb A1c MFr Bld: 12 % — ABNORMAL HIGH (ref 4.8–5.6)
MEAN PLASMA GLUCOSE: 298 mg/dL

## 2015-03-24 LAB — GLUCOSE, CAPILLARY
GLUCOSE-CAPILLARY: 404 mg/dL — AB (ref 70–99)
GLUCOSE-CAPILLARY: 370 mg/dL — AB (ref 70–99)
Glucose-Capillary: 150 mg/dL — ABNORMAL HIGH (ref 70–99)
Glucose-Capillary: 243 mg/dL — ABNORMAL HIGH (ref 70–99)
Glucose-Capillary: 250 mg/dL — ABNORMAL HIGH (ref 70–99)

## 2015-03-24 MED ORDER — GABAPENTIN 100 MG PO CAPS
200.0000 mg | ORAL_CAPSULE | ORAL | Status: DC
  Administered 2015-03-24 – 2015-03-25 (×3): 200 mg via ORAL
  Filled 2015-03-24 (×6): qty 2

## 2015-03-24 MED ORDER — INSULIN DETEMIR 100 UNIT/ML ~~LOC~~ SOLN
56.0000 [IU] | Freq: Every day | SUBCUTANEOUS | Status: DC
  Administered 2015-03-24 – 2015-03-25 (×2): 56 [IU] via SUBCUTANEOUS

## 2015-03-24 MED ORDER — LORAZEPAM 2 MG/ML IJ SOLN
2.0000 mg | Freq: Once | INTRAMUSCULAR | Status: AC
  Administered 2015-03-24: 2 mg via INTRAMUSCULAR
  Filled 2015-03-24: qty 1

## 2015-03-24 MED ORDER — DIVALPROEX SODIUM 500 MG PO DR TAB
500.0000 mg | DELAYED_RELEASE_TABLET | Freq: Every day | ORAL | Status: DC
  Administered 2015-03-25 – 2015-03-26 (×2): 500 mg via ORAL
  Filled 2015-03-24: qty 14
  Filled 2015-03-24 (×2): qty 1
  Filled 2015-03-24: qty 14
  Filled 2015-03-24: qty 1

## 2015-03-24 MED ORDER — BENZTROPINE MESYLATE 1 MG PO TABS
1.0000 mg | ORAL_TABLET | Freq: Four times a day (QID) | ORAL | Status: DC | PRN
  Administered 2015-03-24 – 2015-03-25 (×2): 1 mg via ORAL
  Filled 2015-03-24 (×2): qty 1

## 2015-03-24 MED ORDER — ZIPRASIDONE MESYLATE 20 MG IM SOLR
20.0000 mg | Freq: Once | INTRAMUSCULAR | Status: AC
  Administered 2015-03-24: 20 mg via INTRAMUSCULAR
  Filled 2015-03-24 (×2): qty 20

## 2015-03-24 MED ORDER — RISPERIDONE 2 MG PO TBDP
2.0000 mg | ORAL_TABLET | Freq: Every day | ORAL | Status: DC
  Administered 2015-03-25 – 2015-03-26 (×2): 2 mg via ORAL
  Filled 2015-03-24 (×2): qty 14
  Filled 2015-03-24 (×3): qty 1

## 2015-03-24 MED ORDER — DIVALPROEX SODIUM 250 MG PO DR TAB
750.0000 mg | DELAYED_RELEASE_TABLET | Freq: Every day | ORAL | Status: DC
  Administered 2015-03-24 – 2015-03-25 (×2): 750 mg via ORAL
  Filled 2015-03-24 (×2): qty 3
  Filled 2015-03-24: qty 42
  Filled 2015-03-24: qty 3

## 2015-03-24 MED ORDER — INSULIN ASPART 100 UNIT/ML ~~LOC~~ SOLN
16.0000 [IU] | Freq: Once | SUBCUTANEOUS | Status: AC
  Administered 2015-03-24: 16 [IU] via SUBCUTANEOUS

## 2015-03-24 MED ORDER — DIPHENHYDRAMINE HCL 50 MG/ML IJ SOLN
50.0000 mg | Freq: Once | INTRAMUSCULAR | Status: AC
  Administered 2015-03-24: 50 mg via INTRAMUSCULAR
  Filled 2015-03-24: qty 1

## 2015-03-24 MED ORDER — RISPERIDONE 1 MG PO TBDP
3.0000 mg | ORAL_TABLET | Freq: Every evening | ORAL | Status: DC
  Administered 2015-03-24: 3 mg via ORAL
  Filled 2015-03-24 (×2): qty 3

## 2015-03-24 MED ORDER — HALOPERIDOL 5 MG PO TABS
5.0000 mg | ORAL_TABLET | Freq: Four times a day (QID) | ORAL | Status: DC | PRN
  Administered 2015-03-24 – 2015-03-25 (×3): 5 mg via ORAL
  Filled 2015-03-24 (×3): qty 1

## 2015-03-24 MED ORDER — DIPHENHYDRAMINE HCL 50 MG/ML IJ SOLN
INTRAMUSCULAR | Status: AC
  Filled 2015-03-24: qty 1

## 2015-03-24 NOTE — Progress Notes (Signed)
[facility] MD Progress Note  03/24/2015 2:00 PM DIEGO ULBRICHT  MRN:  [id number]  Subjective: Seaborn reports, "I want to go home , my door is open and any one can get in get my play station. I want to tell you how beautiful you are , now I am happy that they are sending beautiful people to see me . I sat with three other beautiful girls today in the cafeteria." stating this patient started giggling.  Objective: Patient seen and chart reviewed.Discussed patient with treatment team. Pt per staff continues to be hyperactive , intrusive , has mood lability requiring redirection as well as prn medications . Last night patient slept well and that is an improvement. However he required Haldol 5 mg prn since he was anxious and agitated. Pt today appeared to be hypersexual , hyperactive , commenting at staff and writer about the way we look and laughing out loud . Pt appears to be in an elated mood today, continues to have pressured speech. Pt denies SI/HI/AH/VH. Pt denies ADRs of medications. Pt wants to have medications for anxiety and wants to be on Klonopin. Discussed ADRs as well as risks of being on klonopin and discussed that that may not be the right option for patient. Discussed starting gabapentin. Agrees with plan.      Principal Problem: Schizophrenia  Diagnosis:   Patient Active Problem List   Diagnosis Date Noted  . Schizophrenia [F20.9] 03/20/2015  . HCAP (healthcare-associated pneumonia) [J18.9] 01/23/2015  . Hyperglycemia [R73.9] 12/21/2014  . DKA (diabetic ketoacidoses) [E13.10] 11/18/2014  . Hip pain, bilateral [M25.551, M25.552] 11/18/2014  . DKA, type 1 [E10.10] 06/17/2012  . Hyponatremia [E87.1] 06/17/2012  . Dehydration [E86.0] 06/17/2012  . Diabetes type 1, uncontrolled [E10.65] 02/19/2012   Total Time spent with patient: 30 minutes  Past Medical History:  Past Medical History  Diagnosis Date  . Diabetes mellitus   . Depression   . Schizo affective schizophrenia   .  Polysubstance abuse   . Scoliosis   . Chronic pain   . Noncompliance with medication regimen   . DKA (diabetic ketoacidoses) 11/18/2014  . GERD (gastroesophageal reflux disease)   . Arthritis     "both hips" (12/21/2014)   History reviewed. No pertinent past surgical history.  Family History:  Family History  Problem Relation Age of Onset  . Diabetes Mother    Social History:  History  Alcohol Use  . Yes    Comment: 12/21/2014 "I drink on a rare occasion"     History  Drug Use  . Yes  . Special: Marijuana    Comment: 12/21/2014 "I use marijuana on a rare occasion", last used marijuana 01/30/2015    History   Social History  . Marital Status: Married    Spouse Name: N/A  . Number of Children: N/A  . Years of Education: N/A   Social History Main Topics  . Smoking status: Current Some Day Smoker -- 1.00 packs/day for 7 years    Types: Cigarettes  . Smokeless tobacco: Never Used  . Alcohol Use: Yes     Comment: 12/21/2014 "I drink on a rare occasion"  . Drug Use: Yes    Special: Marijuana     Comment: 12/21/2014 "I use marijuana on a rare occasion", last used marijuana 01/30/2015  . Sexual Activity: Yes   Other Topics Concern  . None   Social History Narrative   ** Merged History Encounter **       Additional History:  Sleep: Fair  Appetite:  Good  Musculoskeletal: Strength & Muscle Tone: within normal limits Gait & Station: normal Patient leans: N/A  Psychiatric Specialty Exam: Physical Exam  Psychiatric: Thought content normal. His mood appears anxious. His affect is labile and inappropriate. His affect is not angry and not blunt. His speech is rapid and/or pressured. He is hyperactive. He is not agitated, not aggressive and not combative. Cognition and memory are not impaired. He expresses impulsivity and inappropriate judgment. He does not exhibit a depressed mood.    Review of Systems  Psychiatric/Behavioral: Positive for substance abuse. The patient is  nervous/anxious.     Blood pressure 104/78, pulse 110, temperature 98.2 F (36.8 C), temperature source Oral, resp. rate 20, height 6\' 1"  (1.854 m), weight 86 kg (189 lb 9.5 oz), SpO2 99 %.Body mass index is 25.02 kg/(m^2).  General Appearance: Casual  Eye Contact::  Good  Speech:  Pressured  Volume:  Increased  Mood:  Euphoric  Affect:  Labile  Thought Process:  Tangential  Orientation:  Full (Time, Place, and Person)  Thought Content:  Rumination  Suicidal Thoughts:  No  Homicidal Thoughts:  No  Memory:  Immediate;   Fair Recent;   Fair Remote;   Fair  Judgement:  Impaired  Insight:  Not present  Psychomotor Activity:  Restlessness  Concentration:  Poor  Recall:  Poor  Fund of Knowledge:Poor  Language: Fair  Akathisia:  No  Handed:  Right  AIMS (if indicated):     Assets:  Desire for Improvement  ADL's:  Impaired  Cognition: WNL  Sleep:  Number of Hours: 6.25   Current Medications: Current Facility-Administered Medications  Medication Dose Route Frequency Provider Last Rate Last Dose  . acetaminophen (TYLENOL) tablet 650 mg  650 mg Oral Q6H PRN [id number], NP   650 mg at 03/24/15 1257  . alum & mag hydroxide-simeth (MAALOX/MYLANTA) 200-200-20 MG/5ML suspension 30 mL  30 mL Oral Q4H PRN 05/24/15, NP   30 mL at 03/21/15 0255  . benztropine (COGENTIN) tablet 0.5 mg  0.5 mg Oral BID 05/21/15, MD   0.5 mg at 03/24/15 05/24/15  . haloperidol (HALDOL) tablet 5 mg  5 mg Oral Q6H PRN 5366, MD       And  . benztropine (COGENTIN) tablet 1 mg  1 mg Oral Q6H PRN Jomarie Longs, MD      . Jomarie Longs ON 03/25/2015] divalproex (DEPAKOTE) DR tablet 500 mg  500 mg Oral Daily Skarlett Sedlacek, MD      . divalproex (DEPAKOTE) DR tablet 750 mg  750 mg Oral Q2000 Cassandra Harbold, MD      . gabapentin (NEURONTIN) capsule 200 mg  200 mg Oral [id number] Jeannine Pennisi, MD      . hydrOXYzine (ATARAX/VISTARIL) tablet 25 mg  25 mg Oral TID PRN 05/25/2015, NP   25 mg at  03/24/15 0814  . insulin aspart (novoLOG) injection 0-9 Units  0-9 Units Subcutaneous TID WC 05/24/15, MD   3 Units at 03/24/15 1254  . insulin aspart (novoLOG) injection 10 Units  10 Units Subcutaneous TID WC 05/24/15, MD   10 Units at 03/24/15 1254  . insulin detemir (LEVEMIR) injection 48 Units  48 Units Subcutaneous QHS 05/24/15, MD   48 Units at 03/23/15 2113  . loperamide (IMODIUM) capsule 4 mg  4 mg Oral Q6H PRN 2114, MD      . LORazepam (ATIVAN) tablet 1 mg  1 mg Oral  Q6H PRN Jomarie Longs, MD   1 mg at 03/22/15 2106  . magnesium hydroxide (MILK OF MAGNESIA) suspension 30 mL  30 mL Oral Daily PRN Earney Navy, NP      . nicotine polacrilex (NICORETTE) gum 2 mg  2 mg Oral PRN Jomarie Longs, MD   2 mg at 03/24/15 1308  . polymixin-bacitracin (POLYSPORIN) ointment   Topical BID Jomarie Longs, MD      . Melene Muller ON 03/25/2015] risperiDONE (RISPERDAL M-TABS) disintegrating tablet 2 mg  2 mg Oral Daily Chanika Byland, MD      . risperiDONE (RISPERDAL M-TABS) disintegrating tablet 3 mg  3 mg Oral QPM Bandon Sherwin, MD      . traZODone (DESYREL) tablet 150 mg  150 mg Oral QHS Jomarie Longs, MD   150 mg at 03/23/15 2255  . ziprasidone (GEODON) injection 20 mg  20 mg Intramuscular Once Worthy Flank, NP       Lab Results:  Results for orders placed or performed during the hospital encounter of 03/19/15 (from the past 48 hour(s))  Glucose, capillary     Status: Abnormal   Collection Time: 03/22/15  5:15 PM  Result Value Ref Range   Glucose-Capillary 453 (H) 70 - 99 mg/dL   Comment 1 Document in Chart    Comment 2 Repeat Test   Hemoglobin A1c     Status: Abnormal   Collection Time: 03/22/15  7:45 PM  Result Value Ref Range   Hgb A1c MFr Bld 12.0 (H) 4.8 - 5.6 %    Comment: (NOTE)         Pre-diabetes: 5.7 - 6.4         Diabetes: >6.4         Glycemic control for adults with diabetes: <7.0    Mean Plasma Glucose 298 mg/dL    Comment: (NOTE) Performed  At: [facility] [address] [place], [place] [id number] Mila Homer MD RK:[id number] Performed at [facility]   Glucose, capillary     Status: Abnormal   Collection Time: 03/22/15  8:07 PM  Result Value Ref Range   Glucose-Capillary 310 (H) 70 - 99 mg/dL  Glucose, capillary     Status: Abnormal   Collection Time: 03/23/15  7:17 AM  Result Value Ref Range   Glucose-Capillary 230 (H) 70 - 99 mg/dL  Glucose, capillary     Status: Abnormal   Collection Time: 03/23/15 11:52 AM  Result Value Ref Range   Glucose-Capillary 135 (H) 70 - 99 mg/dL   Comment 1 Notify RN    Comment 2 Document in Chart   Glucose, capillary     Status: Abnormal   Collection Time: 03/23/15  5:37 PM  Result Value Ref Range   Glucose-Capillary 246 (H) 70 - 99 mg/dL   Comment 1 Notify RN    Comment 2 Document in Chart   Valproic acid level     Status: None   Collection Time: 03/23/15  7:31 PM  Result Value Ref Range   Valproic Acid Lvl 52.7 50.0 - 100.0 ug/mL    Comment: Performed at [facility]  Glucose, capillary     Status: Abnormal   Collection Time: 03/24/15  6:08 AM  Result Value Ref Range   Glucose-Capillary 150 (H) 70 - 99 mg/dL   Comment 1 Notify RN   Glucose, capillary     Status: Abnormal   Collection Time: 03/24/15 11:45 AM  Result Value Ref Range   Glucose-Capillary  243 (H) 70 - 99 mg/dL   Comment 1 Document in Chart     Physical Findings: AIMS: Facial and Oral Movements Muscles of Facial Expression: None, normal Lips and Perioral Area: None, normal Jaw: None, normal Tongue: None, normal,Extremity Movements Upper (arms, wrists, hands, fingers): None, normal Lower (legs, knees, ankles, toes): None, normal, Trunk Movements Neck, shoulders, hips: None, normal, Overall Severity Severity of abnormal movements (highest score from questions above): None, normal Incapacitation due to abnormal movements: None, normal Patient's awareness of abnormal  movements (rate only patient's report): No Awareness, Dental Status Current problems with teeth and/or dentures?: No Does patient usually wear dentures?: No  CIWA:  CIWA-Ar Total: 2 COWS:     Assessment:  Murvin is a 29 year old CM , with Schizophrenia. He is also battling brittle diabetes, insulin dependent. Pt continues to be manic , requires medication readjustment.       Treatment Plan Summary: Daily contact with patient to assess and evaluate symptoms and progress in treatment and Medication management:  Increase Risperdal M-tabs to 5 mg po daily for mood control. Increase Depakote DR to 1250 mg po daily. Depakote level on 03/28/15. Continue  Cogentin 0.5 mg for prevention of EPS. Continue Trazodone  150 mg q hs for insomnia. Start Haldol 5 mg po prn for  agitation/severe anxiety sx. Pt with likely paradoxical effect to ativan, will try to hold off ,since it does not seem to help him much.   Medical Decision Making:  Review of Psycho-Social Stressors (1), Review or order clinical lab tests (1), Review and summation of old records (2), Review of Last Therapy Session (1), Review of Medication Regimen & Side Effects (2) and Review of New Medication or Change in Dosage (2)  Phuc Kluttz, MD 03/24/2015, 2:00 PM

## 2015-03-24 NOTE — [facility] Group Notes (Signed)
[facility] LCSW Aftercare Discharge Planning Group Note   03/24/2015 10:04 AM  Participation Quality:  Engaged  Mood/Affect:  Excited  Depression Rating:  denies  Anxiety Rating:  denies  Thoughts of Suicide:  No Will you contract for safety?   NA  Current AVH:  No  Plan for Discharge/Comments:  States he is glad to be awake and in group.  Confirms that he was not sleeping prior to admission.  "I guess I needed to catch up."  States he plans on going with sleep again when he leaves [place].  Lives in housing close to [facility] off of [address].  States he is getting no outpt services, and states he will need none when he leaves here.  "I'm good."  Had just shaved prior to group.  Combed hair.  A bit intrusive, both otherwise OK.  Transportation Means: bus  Supports:   [place], Braddock B

## 2015-03-24 NOTE — Progress Notes (Signed)
Obtained pt's CBG, which was 404 mg/dL.  On-site provider notified and Novolog 16 units subcutaneous ordered and administered.  Levemir 56 units subcutaneous ordered and administered.  Writer was instructed to recheck pt's CBG in 2 hours.  CBG at 2308 was 370 mg/dL.  On-site provider notified.  No new orders given.  Pt is currently resting in his room.  No distress noted.  Respirations are even and unlabored.  Will continue to monitor and assess.

## 2015-03-24 NOTE — [facility] Group Notes (Signed)
[facility] LCSW Group Therapy  03/24/2015 1:13 PM  Type of Therapy:  Group Therapy  Participation Level:  None  Participation Quality:  Intrusive and Resistant  Affect:  Irritable  Cognitive:  Disorganized  Insight:  Limited  Engagement in Therapy:  Limited  Modes of Intervention:  Discussion, Education, Socialization and Support  Summary of Progress/Problems:Mental Health Association (MHA) speaker came to talk about his personal journey with substance abuse and mental illness. Group members were challenged to process ways by which to relate to the speaker. MHA speaker provided handouts and educational information pertaining to groups and services offered by the [facility]. [name] attended group briefly. While the speaker was introducing himself, [name] decided to rip up the provided brochure and walk out of group. He was irritated and ranting. [name] was not allowed back in group.    [name],[name] 03/24/2015, 1:13 PM

## 2015-03-24 NOTE — Progress Notes (Signed)
D: Pt has labile affect and mood.  Pt is hostile, manipulative, impulsive, and attention-seeking at times.  Pt reports his goal was "to go home tomorrow."  When asked how his day was, pt reports "I've had a few upsetting moments.  I don't stay mad."  Pt denies SI/HI, denies hallucinations. Pt repeatedly asked for crackers and other snacks after he knew his CBG was elevated. Pt was restless and was in and out of evening group.  Pt was argumentative with staff and peers.  Pt was yelling that a peer said he was making sexually inappropriate statements.  Pt threatened to attack staff; approached staff yelling "I hope I see you on the streets man.  Come find me when I get out of here.  Let's go!"   A: Medications administered per order.  PRN medication administered for pain, see flow sheet.  Attempted to verbally de-escalate pt as needed, reduced stimuli by asking pt to walk down the hallway and talk to Clinical research associate.  Staff set boundaries with pt.  Actively listened to pt and provided support and encouragement.  PRN medication administered for agitation and anxiety without effect.  On-site provider notified of pt's increasing agitation and threats towards staff and Benadryl 50 mg IMx1, Ativan 2 mg IMx1, and Geodon 20 mg IMx1 ordered and administered.  Pt was cooperative with IM administration.   R: Pt verbally contracts for safety.  He is safe on the unit.  Pt is currently resting in his room with eyes closed.  Respirations are even and unlabored.  Will continue to monitor and assess.

## 2015-03-24 NOTE — Progress Notes (Signed)
Pt has remained extremely agitated today. Pt usually grows agitated when hungry and advised to refrain from high sugar snacks. Pt took medications as directed and insulin as needed. Pt denies any SI/HI/AH/VH. Pt remains very irritable at times. When Pt is not agitated, he is labile and delusional at times. Pt blood sugar continues to be monitored. RN will continue to monitor.

## 2015-03-25 LAB — GLUCOSE, CAPILLARY
GLUCOSE-CAPILLARY: 217 mg/dL — AB (ref 70–99)
GLUCOSE-CAPILLARY: 239 mg/dL — AB (ref 70–99)
Glucose-Capillary: 437 mg/dL — ABNORMAL HIGH (ref 70–99)
Glucose-Capillary: 382 mg/dL — ABNORMAL HIGH (ref 70–99)
Glucose-Capillary: 273 mg/dL — ABNORMAL HIGH (ref 70–99)

## 2015-03-25 MED ORDER — TRAZODONE HCL 100 MG PO TABS
200.0000 mg | ORAL_TABLET | Freq: Every day | ORAL | Status: DC
  Administered 2015-03-25: 200 mg via ORAL
  Filled 2015-03-25: qty 28
  Filled 2015-03-25: qty 2
  Filled 2015-03-25: qty 28
  Filled 2015-03-25: qty 2

## 2015-03-25 MED ORDER — INSULIN ASPART 100 UNIT/ML ~~LOC~~ SOLN
0.0000 [IU] | Freq: Three times a day (TID) | SUBCUTANEOUS | Status: DC
  Administered 2015-03-25: 15 [IU] via SUBCUTANEOUS
  Administered 2015-03-25: 5 [IU] via SUBCUTANEOUS
  Administered 2015-03-26 (×2): 11 [IU] via SUBCUTANEOUS

## 2015-03-25 MED ORDER — GABAPENTIN 300 MG PO CAPS
300.0000 mg | ORAL_CAPSULE | ORAL | Status: DC
  Administered 2015-03-25 – 2015-03-26 (×3): 300 mg via ORAL
  Filled 2015-03-25: qty 1
  Filled 2015-03-25 (×3): qty 42
  Filled 2015-03-25: qty 1
  Filled 2015-03-25 (×2): qty 42
  Filled 2015-03-25 (×3): qty 1
  Filled 2015-03-25: qty 42
  Filled 2015-03-25 (×2): qty 1

## 2015-03-25 MED ORDER — RISPERIDONE 2 MG PO TBDP
4.0000 mg | ORAL_TABLET | Freq: Every evening | ORAL | Status: DC
  Administered 2015-03-25: 4 mg via ORAL
  Filled 2015-03-25 (×2): qty 2
  Filled 2015-03-25 (×2): qty 28

## 2015-03-25 MED ORDER — INSULIN ASPART 100 UNIT/ML ~~LOC~~ SOLN
15.0000 [IU] | Freq: Three times a day (TID) | SUBCUTANEOUS | Status: DC
  Administered 2015-03-25 – 2015-03-26 (×3): 15 [IU] via SUBCUTANEOUS

## 2015-03-25 NOTE — Progress Notes (Signed)
HYPERGLYCEMIC EVENT: Pt's CBG 437 at 1147am, pt denies symptoms. MD, Eappen made aware. Charge RN, Film/video editor made aware. AC, Eric made aware. Per MD Eappen, ok to give 15 units standing, plus 15 units maximum on sliding scale Novolog (See MAR), allow pt to eat lunch, then obtain re-check CBG: Verbal orders taken with read back. Pt encouraged to make healthy meal choices. CBG after lunch 382 at 1302pm, MD Eappen made aware.

## 2015-03-25 NOTE — Plan of Care (Signed)
Problem: Ineffective individual coping Goal: STG: Patient will remain free from self harm Outcome: Progressing Patient remains free from self harm. 15 minute checks continued per protocol for patient safety.   Problem: Alteration in thought process Goal: STG-Patient is able to follow short directions Outcome: Progressing Patient is needing frequent redirection today but is receptive to redirection the majority of the time.

## 2015-03-25 NOTE — Tx Team (Signed)
  Interdisciplinary Treatment Plan Update   Date Reviewed:  03/25/2015  Time Reviewed:  3:14 PM  Progress in Treatment:   Attending groups: Yes Participating in groups: Yes Taking medication as prescribed: Yes  Tolerating medication: Yes Family/Significant other contact made: Yes  Patient understands diagnosis: Yes  Discussing patient identified problems/goals with staff: Yes  See initial care plan Medical problems stabilized or resolved: Yes Denies suicidal/homicidal ideation: Yes  In tx team Patient has not harmed self or others: Yes  For review of initial/current patient goals, please see plan of care.  Estimated Length of Stay:  Likely d/c tomorrow  Reason for Continuation of Hospitalization:   New Problems/Goals identified:  N/A  Discharge Plan or Barriers:   return home, follow up outpt  Additional Comments:  Attendees:  Signature: Ivin Booty, MD 03/25/2015 3:14 PM   Signature: Richelle Ito, LCSW 03/25/2015 3:14 PM  Signature:  03/25/2015 3:14 PM  Signature: Lendell Caprice, RN 03/25/2015 3:14 PM  Signature:  03/25/2015 3:14 PM  Signature:  03/25/2015 3:14 PM  Signature:   03/25/2015 3:14 PM  Signature:    Signature:    Signature:    Signature:    Signature:    Signature:      Scribe for Treatment Team:   Richelle Ito, LCSW  03/25/2015 3:14 PM

## 2015-03-25 NOTE — Progress Notes (Signed)
Inpatient Diabetes Program Recommendations  AACE/ADA: New Consensus Statement on Inpatient Glycemic Control (2013)  Target Ranges:  Prepandial:   less than 140 mg/dL      Peak postprandial:   less than 180 mg/dL (1-2 hours)      Critically ill patients:  140 - 180 mg/dL     Results for [name], [name] (MRN [id number]) as of 03/25/2015 09:08  Ref. Range 03/24/2015 06:08 03/24/2015 11:45 03/24/2015 17:12 03/24/2015 20:41 03/24/2015 23:08  Glucose-Capillary Latest Range: 70-99 mg/dL 179 (H) 150 (H) 569 (H) 404 (H) 370 (H)     Home DM Meds: Levemir 35 units daily  Novolog SSI (not sure if patient has been taking either insulin at home)   Current Orders: Levemir 56 units QHS  Novolog Sensitive SSI tid  Novolog 10 units tidwc     MD- Please consider the following in-hospital insulin adjustments:   1. Increase SSI to Moderate scale tid ac + HS (currently ordered as Sensitive scale)  2. Increase Novolog Meal Coverage to 15 units tid with meals (currently ordered as 10 units tidwc)    Will follow Ambrose Finland RN, MSN, CDE Diabetes Coordinator Inpatient Diabetes Program Team Pager: [phone] (8a-5p)

## 2015-03-25 NOTE — B[facility] Group Notes (Signed)
[facility] Group Notes:  (Nursing/MHT/Case Management/Adjunct)  Date:  03/25/2015  Time:  0900am  Type of Therapy:  Nurse Education  Participation Level:  Active  Participation Quality:  Attentive and Intrusive  Affect:  Angry, Anxious and Labile  Cognitive:  Alert  Insight:  Lacking  Engagement in Group:  Off Topic  Modes of Intervention:  Discussion, Education and Support  Summary of Progress/Problems: Patient attended group. Pt was intrusive during group and off topic, verbally aggressive d/t not being able to discharge today. RN met with pt after group to discuss concerns. Pt receptive to redirection.  Charlyne Quale A 03/25/2015, 10:35 AM

## 2015-03-25 NOTE — Progress Notes (Signed)
D: Patient is alert and oriented. Pt's mood and affect is labile and angry and animated at times. Pt's speech is verbally aggressive at times and flirtatious with staff at times and tangential with flight of ideas. Pt is observed being hyperactive throughout the day. Pt denies SI/HI and AVH. Pt rates depression 3/10, denies hopelessness, and rates anxiety 5/10. Pt reports his goal for the day is "working on discharge plan." Pt has several old scratches on bilateral arms, legs, and feet, skin observed to be dry. Pt reports he is angry because he is ready to discharge today and the doctor told him he may leave tomorrow. Pt requesting to be seen by another doctor for second opinion regarding discharge date, MD [place] consulting with pt at 1500pm. Pt states "I have no advocate, my parents aren't here to call and say its ok to send me home like other people." Pt reports he is bored and wanted to write a complaint d/t not being busy enough during [facility] stay. Pt had hyperglycemic event at 1147 (See additional note). Pt became increasingly agitated this afternoon d/t feeling like he "being held hostage" here and not being able to discharge today. Pt observed waving at the cameras and pacing the halls at times. Pt complains of nicotine craving this afternoon. Pt was on telephone and his voice was over hospital intercom, [facility] Minerva Areola made aware. A: Pt redirected during aggressive outbursts, pt encouraged to remain calm. MD Eappen made aware of pt's hyperglycemic event (See additional note), new orders acknowledged. Encouragement/Support provided to pt. Active listening by RN. Pt given lotion for dry skin. Pt given crossword searches, coloring pages, and puzzle. RN consulted with Eilene Ghazi, Diabetic consult coordinator in regards to home health orders and reports case management is responsible for arranging this follow up. PRN medication administered for agitation and nicotine craving per providers orders (See MAR). Scheduled  medications administered per providers orders (See MAR). 15 minute checks continued per protocol for patient safety.  R: Pt cooperative to redirection at times. Pt remains safe.

## 2015-03-25 NOTE — Progress Notes (Signed)
  [facility] Adult Case Management Discharge Plan :  Will you be returning to the same living situation after discharge:  Yes,  home At discharge, do you have transportation home?: Yes,  bus pass Do you have the ability to pay for your medications: Yes,  mental health  Release of information consent forms completed and in the chart;  Patient's signature needed at discharge.  Patient to Follow up at: Follow-up Information    Follow up with [facility].   Specialty:  Behavioral Health   Why:  When discharged, you will need to go to the walk in clinic. They are open Monday- Friday 8:00am- 10:00am. Take all of your hospital paper work.    Contact information:   [address] [place] [address] [phone]       Patient denies SI/HI: Yes,  yes    Safety Planning and Suicide Prevention discussed: Yes,  yes  Have you used any form of tobacco in the last 30 days? (Cigarettes, Smokeless Tobacco, Cigars, and/or Pipes): Yes  Has patient been referred to the Quitline?: Patient refused referral  Ida Rogue 03/25/2015, 4:21 PM

## 2015-03-25 NOTE — [facility] Suicide Risk Assessment (Signed)
[facility] INPATIENT:  Family/Significant Other Suicide Prevention Education  Suicide Prevention Education:  Patient Refusal for Family/Significant Other Suicide Prevention Education: The patient [name] has refused to provide written consent for family/significant other to be provided Family/Significant Other Suicide Prevention Education during admission and/or prior to discharge.  Physician notified.  Daryel Gerald B 03/25/2015, 3:09 PM

## 2015-03-25 NOTE — Progress Notes (Addendum)
[facility] MD Progress Note  03/25/2015 2:31 PM [name]  MRN:  [id number]  Subjective: [name] reports, "I want to go home , I am being held hostage here , I will call 911 and I am going to tell them that you are keeping me hostage here. I don't trust you any more , I want a new doctor.'   Objective: Patient seen and chart reviewed.Discussed patient with treatment team. Pt continues to be labile , likely has a behavioral/personality component to it . Pt is agitated and upset about not being discharged . Pt seen as talking to cameras off and on and writer also overheard him making a phone call to some one and asking them "Are you the people who are watching me on these cameras in the hallway?" Pt continues to need a lot of redirection as well as prn medications. Pt currently on risperdal , which will be increased today , also is on Depakote .  Pt denies SI/HI/AH/VH. Pt denies ADRs of medications. Pt also with brittle DM , fluctuating CBGs .Will continue to monitor.   Principal Problem: Schizophrenia  Diagnosis:   Patient Active Problem List   Diagnosis Date Noted  . Schizophrenia [F20.9] 03/20/2015  . HCAP (healthcare-associated pneumonia) [J18.9] 01/23/2015  . Hyperglycemia [R73.9] 12/21/2014  . DKA (diabetic ketoacidoses) [E13.10] 11/18/2014  . Hip pain, bilateral [M25.551, M25.552] 11/18/2014  . DKA, type 1 [E10.10] 06/17/2012  . Hyponatremia [E87.1] 06/17/2012  . Dehydration [E86.0] 06/17/2012  . Diabetes type 1, uncontrolled [E10.65] 02/19/2012   Total Time spent with patient: 30 minutes  Past Medical History:  Past Medical History  Diagnosis Date  . Diabetes mellitus   . Depression   . Schizo affective schizophrenia   . Polysubstance abuse   . Scoliosis   . Chronic pain   . Noncompliance with medication regimen   . DKA (diabetic ketoacidoses) 11/18/2014  . GERD (gastroesophageal reflux disease)   . Arthritis     "both hips" (12/21/2014)   History reviewed. No  pertinent past surgical history.  Family History:  Family History  Problem Relation Age of Onset  . Diabetes Mother    Social History:  History  Alcohol Use  . Yes    Comment: 12/21/2014 "I drink on a rare occasion"     History  Drug Use  . Yes  . Special: Marijuana    Comment: 12/21/2014 "I use marijuana on a rare occasion", last used marijuana 01/30/2015    History   Social History  . Marital Status: Married    Spouse Name: N/A  . Number of Children: N/A  . Years of Education: N/A   Social History Main Topics  . Smoking status: Current Some Day Smoker -- 1.00 packs/day for 7 years    Types: Cigarettes  . Smokeless tobacco: Never Used  . Alcohol Use: Yes     Comment: 12/21/2014 "I drink on a rare occasion"  . Drug Use: Yes    Special: Marijuana     Comment: 12/21/2014 "I use marijuana on a rare occasion", last used marijuana 01/30/2015  . Sexual Activity: Yes   Other Topics Concern  . None   Social History Narrative   ** Merged History Encounter **       Additional History:    Sleep: Fair  Appetite:  Good  Musculoskeletal: Strength & Muscle Tone: within normal limits Gait & Station: normal Patient leans: N/A  Psychiatric Specialty Exam: Physical Exam  Psychiatric: Thought content normal. His mood appears anxious.  His affect is labile and inappropriate. His affect is not angry and not blunt. His speech is rapid and/or pressured. He is hyperactive. He is not agitated, not aggressive and not combative. Cognition and memory are not impaired. He expresses impulsivity and inappropriate judgment. He does not exhibit a depressed mood.    Review of Systems  Psychiatric/Behavioral: The patient is nervous/anxious.     Blood pressure 112/75, pulse 102, temperature 98.7 F (37.1 C), temperature source Oral, resp. rate 20, height 6\' 1"  (1.854 m), weight 86 kg (189 lb 9.5 oz), SpO2 99 %.Body mass index is 25.02 kg/(m^2).  General Appearance: Casual  Eye Contact::  Good   Speech:  Pressured  Volume:  Increased at times   Mood:  Irritable, angry  Affect:  Labile  Thought Process:  Loose  Orientation:  Full (Time, Place, and Person)  Thought Content:  Paranoid Ideation and Rumination  Suicidal Thoughts:  No  Homicidal Thoughts:  No  Memory:  Immediate;   Fair Recent;   Fair Remote;   Fair  Judgement:  Impaired  Insight:  Not present  Psychomotor Activity:  Restlessness  Concentration:  Poor  Recall:  Poor  Fund of Knowledge:Poor  Language: Fair  Akathisia:  No  Handed:  Right  AIMS (if indicated):     Assets:  Desire for Improvement  ADL's:  Impaired  Cognition: WNL  Sleep:  Number of Hours: 6.75   Current Medications: Current Facility-Administered Medications  Medication Dose Route Frequency Provider Last Rate Last Dose  . acetaminophen (TYLENOL) tablet 650 mg  650 mg Oral Q6H PRN [id number], NP   650 mg at 03/24/15 1939  . alum & mag hydroxide-simeth (MAALOX/MYLANTA) 200-200-20 MG/5ML suspension 30 mL  30 mL Oral Q4H PRN 05/24/15, NP   30 mL at 03/21/15 0255  . benztropine (COGENTIN) tablet 0.5 mg  0.5 mg Oral BID 05/21/15, MD   0.5 mg at 03/25/15 0905  . haloperidol (HALDOL) tablet 5 mg  5 mg Oral Q6H PRN 05/25/15, MD   5 mg at 03/25/15 1316   And  . benztropine (COGENTIN) tablet 1 mg  1 mg Oral Q6H PRN 05/25/15, MD   1 mg at 03/24/15 2109  . divalproex (DEPAKOTE) DR tablet 500 mg  500 mg Oral Daily 2110, MD   500 mg at 03/25/15 0905  . divalproex (DEPAKOTE) DR tablet 750 mg  750 mg Oral Q2000 05/25/15, MD   750 mg at 03/24/15 1939  . gabapentin (NEURONTIN) capsule 300 mg  300 mg Oral [id number] Christopher Mairena, MD      . hydrOXYzine (ATARAX/VISTARIL) tablet 25 mg  25 mg Oral TID PRN 05/24/15, NP   25 mg at 03/24/15 2157  . insulin aspart (novoLOG) injection 0-15 Units  0-15 Units Subcutaneous TID WC [birth year]-1985, MD   15 Units at 03/25/15 1205  . insulin aspart (novoLOG) injection  15 Units  15 Units Subcutaneous TID WC 05/25/15, MD   15 Units at 03/25/15 1203  . insulin detemir (LEVEMIR) injection 56 Units  56 Units Subcutaneous QHS 05/25/15, PA-C   56 Units at 03/24/15 2119  . loperamide (IMODIUM) capsule 4 mg  4 mg Oral Q6H PRN 2120, MD      . LORazepam (ATIVAN) tablet 1 mg  1 mg Oral Q6H PRN Raeford Razor, MD   1 mg at 03/22/15 2106  . magnesium hydroxide (MILK OF MAGNESIA) suspension 30 mL  30  mL Oral Daily PRN Earney Navy, NP      . nicotine polacrilex (NICORETTE) gum 2 mg  2 mg Oral PRN Jomarie Longs, MD   2 mg at 03/24/15 1939  . polymixin-bacitracin (POLYSPORIN) ointment   Topical BID Jomarie Longs, MD      . risperiDONE (RISPERDAL M-TABS) disintegrating tablet 2 mg  2 mg Oral Daily Jomarie Longs, MD   2 mg at 03/25/15 0906  . risperiDONE (RISPERDAL M-TABS) disintegrating tablet 4 mg  4 mg Oral QPM Gerianne Simonet, MD      . traZODone (DESYREL) tablet 150 mg  150 mg Oral QHS Jomarie Longs, MD   150 mg at 03/24/15 2109  . ziprasidone (GEODON) injection 20 mg  20 mg Intramuscular Once Worthy Flank, NP       Lab Results:  Results for orders placed or performed during the hospital encounter of 03/19/15 (from the past 48 hour(s))  Glucose, capillary     Status: Abnormal   Collection Time: 03/23/15  5:37 PM  Result Value Ref Range   Glucose-Capillary 246 (H) 70 - 99 mg/dL   Comment 1 Notify RN    Comment 2 Document in Chart   Valproic acid level     Status: None   Collection Time: 03/23/15  7:31 PM  Result Value Ref Range   Valproic Acid Lvl 52.7 50.0 - 100.0 ug/mL    Comment: Performed at [facility]  Glucose, capillary     Status: Abnormal   Collection Time: 03/24/15  6:08 AM  Result Value Ref Range   Glucose-Capillary 150 (H) 70 - 99 mg/dL   Comment 1 Notify RN   Glucose, capillary     Status: Abnormal   Collection Time: 03/24/15 11:45 AM  Result Value Ref Range   Glucose-Capillary 243 (H) 70 - 99 mg/dL    Comment 1 Document in Chart   Glucose, capillary     Status: Abnormal   Collection Time: 03/24/15  5:12 PM  Result Value Ref Range   Glucose-Capillary 250 (H) 70 - 99 mg/dL  Glucose, capillary     Status: Abnormal   Collection Time: 03/24/15  8:41 PM  Result Value Ref Range   Glucose-Capillary 404 (H) 70 - 99 mg/dL  Glucose, capillary     Status: Abnormal   Collection Time: 03/24/15 11:08 PM  Result Value Ref Range   Glucose-Capillary 370 (H) 70 - 99 mg/dL  Glucose, capillary     Status: Abnormal   Collection Time: 03/25/15  6:12 AM  Result Value Ref Range   Glucose-Capillary 217 (H) 70 - 99 mg/dL  Glucose, capillary     Status: Abnormal   Collection Time: 03/25/15 11:47 AM  Result Value Ref Range   Glucose-Capillary 437 (H) 70 - 99 mg/dL   Comment 1 Notify RN    Comment 2 Document in Chart   Glucose, capillary     Status: Abnormal   Collection Time: 03/25/15  1:02 PM  Result Value Ref Range   Glucose-Capillary 382 (H) 70 - 99 mg/dL    Physical Findings: AIMS: Facial and Oral Movements Muscles of Facial Expression: None, normal Lips and Perioral Area: None, normal Jaw: None, normal Tongue: None, normal,Extremity Movements Upper (arms, wrists, hands, fingers): None, normal Lower (legs, knees, ankles, toes): None, normal, Trunk Movements Neck, shoulders, hips: None, normal, Overall Severity Severity of abnormal movements (highest score from questions above): None, normal Incapacitation due to abnormal movements: None, normal Patient's awareness of abnormal movements (rate  only patient's report): No Awareness, Dental Status Current problems with teeth and/or dentures?: No Does patient usually wear dentures?: No  CIWA:  CIWA-Ar Total: 6 COWS:     Assessment:  Romaine is a 29 year old CM , with Schizophrenia. He is also battling brittle diabetes, insulin dependent. Pt continues to be manic, labile , intrusive and paranoid , requires medication readjustment. Patient to be  seen by [facility] ,since patient requests a second opinion.      Treatment Plan Summary: Daily contact with patient to assess and evaluate symptoms and progress in treatment and Medication management:  Increase Risperdal M-tabs to 6 mg po daily for mood control. Continue Depakote DR  1250 mg po daily. Depakote level on 03/28/15. Increase Gabapentin to 300 mg po tid to augment the effect of Depakote. Continue  Cogentin 0.5 mg for prevention of EPS. Increase Trazodone to 200 mg q hs for insomnia. Continue Haldol 5 mg po prn for  agitation/severe anxiety sx. Pt with likely paradoxical effect to ativan, will try to hold off ,since it does not seem to help him much. CSW will work on disposition. DM management per Diabetic coordinator recommendations.   Medical Decision Making:  Review of Psycho-Social Stressors (1), Review or order clinical lab tests (1), Review and summation of old records (2), Review of Last Therapy Session (1), Review of Medication Regimen & Side Effects (2) and Review of New Medication or Change in Dosage (2)  Jean Alejos, MD 03/25/2015, 2:31 PM

## 2015-03-25 NOTE — B[facility] Group Notes (Signed)
[facility] Group Notes:  (Counselor/Nursing/MHT/Case Management/Adjunct)  03/25/2015 1:15PM  Type of Therapy:  Group Therapy  Participation Level:  Active  Participation Quality:  Appropriate  Affect:  Flat  Cognitive:  Oriented  Insight:  Improving  Engagement in Group:  Limited  Engagement in Therapy:  Limited  Modes of Intervention:  Discussion, Exploration and Socialization  Summary of Progress/Problems: The topic for group was balance in life.  Pt participated in the discussion about when their life was in balance and out of balance and how this feels.  Pt discussed ways to get back in balance and short term goals they can work on to get where they want to be. Loud, intrusive, talking about delusions, perseverating.  Self selected out of group soon after it started.   Daryel Gerald B 03/25/2015 1:35 PM

## 2015-03-26 DIAGNOSIS — F203 Undifferentiated schizophrenia: Secondary | ICD-10-CM | POA: Insufficient documentation

## 2015-03-26 LAB — GLUCOSE, CAPILLARY
GLUCOSE-CAPILLARY: 337 mg/dL — AB (ref 70–99)
Glucose-Capillary: 313 mg/dL — ABNORMAL HIGH (ref 70–99)

## 2015-03-26 MED ORDER — "INSULIN SYRINGE 29G X 1/2"" 0.5 ML MISC": 1.0000 | Freq: Two times a day (BID) | Status: DC

## 2015-03-26 MED ORDER — GLUCOSE BLOOD VI STRP: ORAL_STRIP | Status: DC

## 2015-03-26 MED ORDER — RISPERIDONE 2 MG PO TBDP: ORAL_TABLET | ORAL | Status: DC

## 2015-03-26 MED ORDER — INSULIN ASPART PROT & ASPART (70-30 MIX) 100 UNIT/ML ~~LOC~~ SUSP: 40.0000 [IU] | Freq: Two times a day (BID) | SUBCUTANEOUS | Status: DC

## 2015-03-26 MED ORDER — TRAZODONE HCL 100 MG PO TABS: 200.0000 mg | ORAL_TABLET | Freq: Every day | ORAL | Status: DC

## 2015-03-26 MED ORDER — HYDROXYZINE HCL 25 MG PO TABS: 25.0000 mg | ORAL_TABLET | Freq: Three times a day (TID) | ORAL | Status: DC | PRN

## 2015-03-26 MED ORDER — FREESTYLE SYSTEM KIT: 1.0000 | PACK | Status: DC | PRN

## 2015-03-26 MED ORDER — METFORMIN HCL 1000 MG PO TABS: 1000.0000 mg | ORAL_TABLET | Freq: Two times a day (BID) | ORAL | Status: DC

## 2015-03-26 MED ORDER — DIVALPROEX SODIUM 500 MG PO DR TAB: DELAYED_RELEASE_TABLET | ORAL | Status: DC

## 2015-03-26 MED ORDER — METFORMIN HCL 500 MG PO TABS
1000.0000 mg | ORAL_TABLET | Freq: Two times a day (BID) | ORAL | Status: DC
  Filled 2015-03-26 (×2): qty 12
  Filled 2015-03-26: qty 2
  Filled 2015-03-26 (×2): qty 12
  Filled 2015-03-26: qty 2

## 2015-03-26 MED ORDER — FREESTYLE LANCETS MISC: Status: DC

## 2015-03-26 MED ORDER — BENZTROPINE MESYLATE 0.5 MG PO TABS: 0.5000 mg | ORAL_TABLET | Freq: Two times a day (BID) | ORAL | Status: DC

## 2015-03-26 MED ORDER — GABAPENTIN 300 MG PO CAPS: 300.0000 mg | ORAL_CAPSULE | ORAL | Status: DC

## 2015-03-26 MED ORDER — INSULIN ASPART 100 UNIT/ML ~~LOC~~ SOLN: 0.0000 [IU] | Freq: Three times a day (TID) | SUBCUTANEOUS | Status: DC

## 2015-03-26 NOTE — Progress Notes (Signed)
  [facility] Adult Case Management Discharge Plan :  Will you be returning to the same living situation after discharge:  Yes,  Home  At discharge, do you have transportation home?: Yes,  Bus  Do you have the ability to pay for your medications: Yes,  Mental Health   Release of information consent forms completed and in the chart;  Patient's signature needed at discharge.  Patient to Follow up at: Follow-up Information    Follow up with [facility]   Specialty:  Behavioral Health   Why:  When discharged, you will need to go to the walk in clinic. They are open Monday- Friday 8:00am- 10:00am. Take all of your hospital paper work.    [address] [place] [address] [phone]       Follow up with [facility]     On 03/26/2015.   Contact information:   [address] [place] [address] [phone]      Patient denies SI/HI: Yes,  Yes    Safety Planning and Suicide Prevention discussed: Yes,  With patient.   Have you used any form of tobacco in the last 30 days? (Cigarettes, Smokeless Tobacco, Cigars, and/or Pipes): Yes  Has patient been referred to the Quitline?: Patient refused referral  [name],[name] 03/26/2015, 10:44 AM

## 2015-03-26 NOTE — B[facility] Suicide Risk Assessment (Signed)
[facility] Discharge Suicide Risk Assessment   Demographic Factors:  Male and Caucasian  Total Time spent with patient: 30 minutes  Musculoskeletal: Strength & Muscle Tone: within normal limits Gait & Station: normal Patient leans: N/A  Psychiatric Specialty Exam: Physical Exam  Review of Systems  Psychiatric/Behavioral: Negative for depression, suicidal ideas, hallucinations and substance abuse. The patient is not nervous/anxious and does not have insomnia.     Blood pressure 96/64, pulse 125, temperature 97.6 F (36.4 C), temperature source Oral, resp. rate 18, height 6\' 1"  (1.854 m), weight 86 kg (189 lb 9.5 oz), SpO2 99 %.Body mass index is 25.02 kg/(m^2).  General Appearance: Casual  Eye Contact::  Good  Speech:  Clear and Coherent409  Volume:  Normal  Mood:  Euthymic  Affect:  Appropriate  Thought Process:  Coherent  Orientation:  Full (Time, Place, and Person)  Thought Content:  WDL  Suicidal Thoughts:  No  Homicidal Thoughts:  No  Memory:  Immediate;   Fair Recent;   Fair Remote;   Fair  Judgement:  Fair  Insight:  Fair  Psychomotor Activity:  Normal  Concentration:  Fair  Recall:  [id number] of Knowledge:Fair  Language: Fair  Akathisia:  No  Handed:  Right  AIMS (if indicated):     Assets:  Communication Skills Desire for Improvement  Sleep:  Number of Hours: 6.75  Cognition: WNL  ADL's:  Intact   Have you used any form of tobacco in the last 30 days? (Cigarettes, Smokeless Tobacco, Cigars, and/or Pipes): Yes  Has this patient used any form of tobacco in the last 30 days? (Cigarettes, Smokeless Tobacco, Cigars, and/or Pipes) No  Mental Status Per Nursing Assessment::   On Admission:  NA  Current Mental Status by Physician: patient denies SI/HI/AH/VH  Loss Factors: Decline in physical health  Historical Factors: Impulsivity  Risk Reduction Factors:   Positive coping skills or problem solving skills  Continued Clinical Symptoms:  Previous  Psychiatric Diagnoses and Treatments Medical Diagnoses and Treatments/Surgeries  Cognitive Features That Contribute To Risk:  Polarized thinking    Suicide Risk:  Minimal: No identifiable suicidal ideation.  Patients presenting with no risk factors but with morbid ruminations; may be classified as minimal risk based on the severity of the depressive symptoms  Principal Problem: Schizophrenia Discharge Diagnoses:  Patient Active Problem List   Diagnosis Date Noted  . Schizophrenia [F20.9] 03/20/2015  . HCAP (healthcare-associated pneumonia) [J18.9] 01/23/2015  . Hyperglycemia [R73.9] 12/21/2014  . DKA (diabetic ketoacidoses) [E13.10] 11/18/2014  . Hip pain, bilateral [M25.551, M25.552] 11/18/2014  . DKA, type 1 [E10.10] 06/17/2012  . Hyponatremia [E87.1] 06/17/2012  . Dehydration [E86.0] 06/17/2012  . Diabetes type 1, uncontrolled [E10.65] 02/19/2012    Follow-up Information    Follow up with [facility].   Specialty:  Behavioral Health   Why:  When discharged, you will need to go to the walk in clinic. They are open Monday- Friday 8:00am- 10:00am. Take all of your hospital paper work.    Contact information[address] [place] [phone]       Follow up with [facility]     On 03/26/2015.   Contact information:   [address] [place] [phone]      Plan Of Care/Follow-up recommendations:  Activity:  No restrictions Diet:  carb modified Tests:  Depakote level on 03/28/15. HBA1c as recommended by PCP Other:  Patient to be transported to [facility]  wellness center from here for diabetic care  Is patient on multiple antipsychotic therapies at discharge:  No   Has Patient had three or more failed trials of antipsychotic monotherapy by history:  No  Recommended Plan for Multiple Antipsychotic Therapies: NA    Rayner Erman MD 03/26/2015, 10:26 AM

## 2015-03-26 NOTE — Progress Notes (Signed)
Inpatient Diabetes Program Recommendations  AACE/ADA: New Consensus Statement on Inpatient Glycemic Control (2013)  Target Ranges:  Prepandial:   less than 140 mg/dL      Peak postprandial:   less than 180 mg/dL (1-2 hours)      Critically ill patients:  140 - 180 mg/dL     Results for [name], [name] (MRN [id number]) as of 03/26/2015 08:41  Ref. Range 03/25/2015 06:12 03/25/2015 11:47 03/25/2015 13:02 03/25/2015 16:44 03/25/2015 19:52  Glucose-Capillary Latest Range: 70-99 mg/dL 097 (H) 353 (H) 299 (H) 239 (H) 273 (H)    Results for [name] (MRN [id number]) as of 03/26/2015 08:41  Ref. Range 03/26/2015 06:27  Glucose-Capillary Latest Range: 70-99 mg/dL 622 (H)     Home DM Meds: Levemir 35 units daily  Novolog SSI (not sure if patient has been taking either insulin at home)   Current Orders: Levemir 56 units QHS  Novolog Moderate SSI tid  Novolog 15 units tidwc    **Received referral for this patient.  Patient likely to be discharged home today and needs to continue DM medications at home.  Per MD, question whether patient will be able to take insulin injections at home.  **Based on the amount of insulin that patient has needed and patient's blood sugar levels here in the hospital, patient likely needs insulin at home- I am not sure he can be discharged without insulin.  We could try an oral DM medication regimen, however, patient will likely have extremely high blood glucose levels at home without insulin.  Sherron Monday with RN caring for patient today.  Per RN, patient does not have insurance and needs a CBG meter.  Recommended to RN that patient be set up with an appointment at the [facility] and that patient take his Rxs to the [facility] and Wellness after d/c from [facility] to get his Rxs filled.  If patient has an appointment at the [facility] and Wellness center for  follow-up, he can pick up his Rxs there today.    Recommend the following at time of discharge:  1. Have Care Manager/ Social Work set patient up with a follow up appointment at the [facility] and [employer] (phone number for the Scheduler at the Clinic is [phone]).  If patient has a follow-up appointment at the Clinic, he can go straight to the clinic after discharge and get his Rxs filled there.  If Care Manager/ Social Work cannot get an appointment for pt for follow-up, have patient go straight to the clinic after d/c and he can make a follow-up appt with the staff today and still gets his Rxs filled today.  Clinic address is: [address] (near [facility])  2. Convert patient to Novolog 70/30 insulin at discharge.  Patient can do two shots of insulin a day with the 70/30 insulin versus 4 shots a day with Levemir and Novolog.  3. Recommend the following- Novolog 70/30 insulin- 40 units bid with meals (breakfast and supper)  4. Would also give patient a Rx to start Metformin 1000 mg bid  5. Make sure patient gets a Rx for CBG meter as well    Please give patient the following Rxs (either handwritten or can place in [facility]):  1. Novolog 70/30 insulin- 40 units bid with meals (breakfast and supper)- Order # [id number]  2. Metformin 1000 mg bid- Order # [id number]  3. Blood glucose meter and supplies- Order # [id number]  4. Insulin syringes-  0.61ml (U-100)- Order # [id number]    Will follow Ambrose Finland RN, MSN, CDE Diabetes Coordinator Inpatient Diabetes Program Team Pager: [phone] (8a-5p)

## 2015-03-26 NOTE — Progress Notes (Signed)
Dr Elna Breslow spoke with patient re discharge today, but client is in need of a glucometer. Spoke with diabetic coordinator and she suggested he go directly to the [facility], and he can get his machine and medications filled today, and we should schedule him with a follow up appt before he leaves here. Information discussed with Dr. And will discuss further in treatment team. Also, the coordinator suggested [facility] but if he can get there the machine and strips will be about 16.00 dollars. Getting there and affording it may be too difficult to be successful.

## 2015-03-26 NOTE — Progress Notes (Signed)
D. Pt had been up and visible in milieu this evening, unable to attend evening group activity. Pt had been labile but responded appropriately with re-direction from staff. Pt spoke about how he is aware that he may discharge in the morning and spoke about how he is feeling ready to leave. Pt has denied any SI and did receive all medications without incident and did not have any verbal outbursts this evening. A. Support and encouragement provided. R. Safety maintained, will continue to monitor.

## 2015-03-26 NOTE — BHH Group Notes (Signed)
[facility] LCSW Aftercare Discharge Planning Group Note   03/26/2015 11:54 AM  Participation Quality:  Active   Mood/Affect:  Labile  Depression Rating:  0  Anxiety Rating:  0  Thoughts of Suicide:  No Will you contract for safety?   NA  Current AVH:  No  Plan for Discharge/Comments:  Pt will be discharged today. He plans to go home and follow up with outpatient. Pt stated he will take his medications when discharged but did air quotes while saying it.   Transportation Means: Bus    Supports: None   [name],[name]

## 2015-03-26 NOTE — Progress Notes (Signed)
Patient ID: [name], male   DOB: 05-16-1986, 29 y.o.   MRN: [id number] Discharge Note- Reviewed with him his follow up plans to go to [facility] before 10 am M-F.Also, reviewed his medications. He was given one week of medications and 30 day supply of medications.via a Rx. He was given 2 bus tickets and he is to leave here and go directly to the Wellness Center to pick up his diabetes supplies and he will have his medical follow up there.He did not have any footwear to discharge in, gave him a pair of donated tennis shoes he was very happy with,in order to take the bus to the [facility]. He is pleasant, and has a childlike simplicity to him. Returned his personal items, which were a belt and two cards. Escorted to the lobby for discharge.He denies any thoughts to hurt self or others, and is not psychotic.

## 2015-03-26 NOTE — Discharge Summary (Signed)
Physician Discharge Summary Note  Patient:  [name] is an 29 y.o., male MRN:  [id number] DOB:  1986-07-05 Patient phone:  [phone] (home)  Patient address:   [address] [place] [address],  Total Time spent with patient: Greater than 30 minutes  Date of Admission:  03/19/2015  Date of Discharge: 03/26/15  Reason for Admission:  Bizarre  behavior  Principal Problem: Schizophrenia Discharge Diagnoses: Patient Active Problem List   Diagnosis Date Noted  . Schizophrenia [F20.9] 03/20/2015  . HCAP (healthcare-associated pneumonia) [J18.9] 01/23/2015  . Hyperglycemia [R73.9] 12/21/2014  . DKA (diabetic ketoacidoses) [E13.10] 11/18/2014  . Hip pain, bilateral [M25.551, M25.552] 11/18/2014  . DKA, type 1 [E10.10] 06/17/2012  . Hyponatremia [E87.1] 06/17/2012  . Dehydration [E86.0] 06/17/2012  . Diabetes type 1, uncontrolled [E10.65] 02/19/2012   Musculoskeletal: Strength & Muscle Tone: within normal limits Gait & Station: normal Patient leans: N/A  Psychiatric Specialty Exam: Physical Exam  Psychiatric: His speech is normal and behavior is normal. Thought content normal. His mood appears not anxious. His affect is not angry, not blunt, not labile and not inappropriate. Cognition and memory are normal. He does not exhibit a depressed mood.    Review of Systems  Constitutional: Negative.   HENT: Negative.   Eyes: Negative.   Cardiovascular: Negative.   Gastrointestinal: Negative.   Genitourinary: Negative.   Skin: Negative.   Neurological: Negative.   Endo/Heme/Allergies: Negative.   Psychiatric/Behavioral: Positive for hallucinations (Hx. of) and substance abuse (Hx. polysubstance dependence). Negative for depression, suicidal ideas and memory loss. The patient has insomnia (Stable). The patient is not nervous/anxious.     Blood pressure 96/64, pulse 125, temperature 97.6 F (36.4 C), temperature source Oral, resp. rate 18, height $RemoveBe'6\' 1"'MLyCZljyu$  (1.854 m),  weight 86 kg (189 lb 9.5 oz), SpO2 99 %.Body mass index is 25.02 kg/(m^2).  See Md's SRA   Past Medical History:  Past Medical History  Diagnosis Date  . Diabetes mellitus   . Depression   . Schizo affective schizophrenia   . Polysubstance abuse   . Scoliosis   . Chronic pain   . Noncompliance with medication regimen   . DKA (diabetic ketoacidoses) 11/18/2014  . GERD (gastroesophageal reflux disease)   . Arthritis     "both hips" (12/21/2014)   History reviewed. No pertinent past surgical history. Family History:  Family History  Problem Relation Age of Onset  . Diabetes Mother    Social History:  History  Alcohol Use  . Yes    Comment: 12/21/2014 "I drink on a rare occasion"     History  Drug Use  . Yes  . Special: Marijuana    Comment: 12/21/2014 "I use marijuana on a rare occasion", last used marijuana 01/30/2015    History   Social History  . Marital Status: Married    Spouse Name: N/A  . Number of Children: N/A  . Years of Education: N/A   Social History Main Topics  . Smoking status: Current Some Day Smoker -- 1.00 packs/day for 7 years    Types: Cigarettes  . Smokeless tobacco: Never Used  . Alcohol Use: Yes     Comment: 12/21/2014 "I drink on a rare occasion"  . Drug Use: Yes    Special: Marijuana     Comment: 12/21/2014 "I use marijuana on a rare occasion", last used marijuana 01/30/2015  . Sexual Activity: Yes   Other Topics Concern  . None   Social History Narrative   **  Merged History Encounter **       Risk to Self: What has been your use of drugs/alcohol within the last 12 months?: Pt reports using alcohol about 3-4 days ago.  Pt reports "on a good day" that he drink about a 5th of vodka.   Risk to Others: No  Prior Inpatient Therapy: No  Prior Outpatient Therapy: No  Level of Care:  OP  Hospital Course:  [name] is a 29 y.o. c male with a PMHx of DM, schizophrenia, polysubstance abuse, chronic pain, scoliosis, medication  noncompliance, GERD, and b/l hip arthritis, who presented to the [facility] accompanied by law enforcement who were called out for bizarre behavior. Patient in [place] Patient otherwise was unable to provide further details while in ED. Patient when asked about AH ,reported " I can hear your voice and mine.' Pt appeared to be smiling and talkative during evaluation, speech somewhat pressured . Pt denied SI/HI/Paranoia.  After his admission assessment/evaluation, [name]'s worsening symptoms of Schizophrenia were identified and medications targeting his symptoms were initiated. He was medicated & discharged on Benztropine 0.5 mg bid daily for prevention of drug induced EPS, Depakote 500 mg Q daily & 750 mg Q bedtime, Gabapentin 300 mg tid for agitation, Risperdal disintegrating tablets 2 mg daily & 4 mg at bedtime for mood control & Hydroxyzine 25 mg qid prn for anxiety. He was also resumed on all other medication regimen for the other medical issues that he presented. He was enrolled and participated in the group counseling sessions being offered and held on this unit. He learned coping skills  During his hospital stay, [name] presented with an unstable diabetes symptoms evidenced by increased blood sugar readings. He made frequent ED visits including diabetic/primary care consults to determine the cause of the unstable blood sugar readings. During this process, his diabetes medications were adjusted to meet his needs. He was discharged to continue medical care at the [facility] here in [place], [place]. And for psychiatric care & medication management, he will be receiving these services at [facility] clinic. He is provided with all the pertinent information required to make this appointment without problems.  Upon discharge, he adamantly denies any SIHI, AVh, delusional thoughts and or paranoia. He was  provided with a 14 days worth, supply samples of his [facility] discharge medications. He left [facility] with all personal belongings in no apparent distress. Transportation per [employer]. [place] assisted with taxi fare.  Consults:  psychiatry  Significant Diagnostic Studies:  labs: CBC with diff, CMP, UDS, toxicology tests, U/A, Depakote levels, results reviewed, stable  Discharge Vitals:   Blood pressure 96/64, pulse 125, temperature 97.6 F (36.4 C), temperature source Oral, resp. rate 18, height $RemoveBe'6\' 1"'teRwFbrEb$  (1.854 m), weight 86 kg (189 lb 9.5 oz), SpO2 99 %. Body mass index is 25.02 kg/(m^2). Lab Results:   Results for orders placed or performed during the hospital encounter of 03/19/15 (from the past 72 hour(s))  Glucose, capillary     Status: Abnormal   Collection Time: 03/23/15 11:52 AM  Result Value Ref Range   Glucose-Capillary 135 (H) 70 - 99 mg/dL   Comment 1 Notify RN    Comment 2 Document in Chart   Glucose, capillary     Status: Abnormal   Collection Time: 03/23/15  5:37 PM  Result Value Ref Range   Glucose-Capillary 246 (H) 70 - 99 mg/dL  Comment 1 Notify RN    Comment 2 Document in Chart   Valproic acid level     Status: None   Collection Time: 03/23/15  7:31 PM  Result Value Ref Range   Valproic Acid Lvl 52.7 50.0 - 100.0 ug/mL    Comment: Performed at [facility]  Glucose, capillary     Status: Abnormal   Collection Time: 03/24/15  6:08 AM  Result Value Ref Range   Glucose-Capillary 150 (H) 70 - 99 mg/dL   Comment 1 Notify RN   Glucose, capillary     Status: Abnormal   Collection Time: 03/24/15 11:45 AM  Result Value Ref Range   Glucose-Capillary 243 (H) 70 - 99 mg/dL   Comment 1 Document in Chart   Glucose, capillary     Status: Abnormal   Collection Time: 03/24/15  5:12 PM  Result Value Ref Range   Glucose-Capillary 250 (H) 70 - 99 mg/dL  Glucose, capillary     Status: Abnormal   Collection Time: 03/24/15  8:41 PM  Result Value Ref Range   Glucose-Capillary 404 (H)  70 - 99 mg/dL  Glucose, capillary     Status: Abnormal   Collection Time: 03/24/15 11:08 PM  Result Value Ref Range   Glucose-Capillary 370 (H) 70 - 99 mg/dL  Glucose, capillary     Status: Abnormal   Collection Time: 03/25/15  6:12 AM  Result Value Ref Range   Glucose-Capillary 217 (H) 70 - 99 mg/dL  Glucose, capillary     Status: Abnormal   Collection Time: 03/25/15 11:47 AM  Result Value Ref Range   Glucose-Capillary 437 (H) 70 - 99 mg/dL   Comment 1 Notify RN    Comment 2 Document in Chart   Glucose, capillary     Status: Abnormal   Collection Time: 03/25/15  1:02 PM  Result Value Ref Range   Glucose-Capillary 382 (H) 70 - 99 mg/dL  Glucose, capillary     Status: Abnormal   Collection Time: 03/25/15  4:44 PM  Result Value Ref Range   Glucose-Capillary 239 (H) 70 - 99 mg/dL   Comment 1 Notify RN    Comment 2 Document in Chart   Glucose, capillary     Status: Abnormal   Collection Time: 03/25/15  7:52 PM  Result Value Ref Range   Glucose-Capillary 273 (H) 70 - 99 mg/dL  Glucose, capillary     Status: Abnormal   Collection Time: 03/26/15  6:27 AM  Result Value Ref Range   Glucose-Capillary 313 (H) 70 - 99 mg/dL    Physical Findings: AIMS: Facial and Oral Movements Muscles of Facial Expression: None, normal Lips and Perioral Area: None, normal Jaw: None, normal Tongue: None, normal,Extremity Movements Upper (arms, wrists, hands, fingers): None, normal Lower (legs, knees, ankles, toes): None, normal, Trunk Movements Neck, shoulders, hips: None, normal, Overall Severity Severity of abnormal movements (highest score from questions above): None, normal Incapacitation due to abnormal movements: None, normal Patient's awareness of abnormal movements (rate only patient's report): No Awareness, Dental Status Current problems with teeth and/or dentures?: No Does patient usually wear dentures?: No  CIWA:  CIWA-Ar Total: 5 COWS:     See Psychiatric Specialty Exam and Suicide  Risk Assessment completed by Attending Physician prior to discharge.  Discharge destination:  Home  Is patient on multiple antipsychotic therapies at discharge:  No   Has Patient had three or more failed trials of antipsychotic monotherapy by history:  No  Recommended Plan for Multiple  Antipsychotic Therapies: NA     Discharge Instructions    Discharge instructions    Complete by:  As directed   Check Depakote level on 03-28-15            Medication List    STOP taking these medications        acetaminophen-codeine 300-30 MG per tablet  Commonly known as:  TYLENOL #3     amphetamine-dextroamphetamine 20 MG tablet  Commonly known as:  ADDERALL     doxepin 100 MG capsule  Commonly known as:  SINEQUAN     fluticasone 50 MCG/ACT nasal spray  Commonly known as:  FLONASE     insulin detemir 100 UNIT/ML injection  Commonly known as:  LEVEMIR      TAKE these medications      Indication   benztropine 0.5 MG tablet  Commonly known as:  COGENTIN  Take 1 tablet (0.5 mg total) by mouth 2 (two) times daily. For prevention of drug induced tremors   Indication:  Extrapyramidal Reaction caused by Medications     divalproex 500 MG DR tablet  Commonly known as:  DEPAKOTE  Take 1 Tablet (500 mg) daily & 1.5 tablets (750 mg) at bedtime: For mood stabilization   Indication:  Mood stabilization     freestyle lancets  Use as instructed: For blood sugar checks   Indication:  Diabetes managemnt     gabapentin 300 MG capsule  Commonly known as:  NEURONTIN  Take 1 capsule (300 mg total) by mouth 3 (three) times daily at 8am, 3pm and bedtime. For agitation   Indication:  Agitation     glucose blood test strip  Commonly known as:  FREESTYLE LITE  Use as instructed   Indication:  For glucose monitoring     glucose monitoring kit monitoring kit  1 each by Does not apply route as needed for other. Test blood sugars three daily and when needed: For blood sugar checks   Indication:   Glucose monitoring     hydrOXYzine 25 MG tablet  Commonly known as:  ATARAX/VISTARIL  Take 1 tablet (25 mg total) by mouth 3 (three) times daily as needed for anxiety.   Indication:  Anxiety     insulin aspart 100 UNIT/ML injection  Commonly known as:  novoLOG  - Inject 0-9 Units into the skin 3 (three) times daily with meals. CBG < 70: Eat or drink something sweet and recheck.  - CBG 70 - 120: 0 units  - CBG 121 - 150: 1 unit  - CBG 151 - 200: 2 units: Blood sugar sliding scale insulin coverage.  - CBG 201 - 250: 3 units  - CBG 251 - 300: 5 units  - CBG 301 - 350: 7 units  - CBG 351 - 400: 9 units  - CBG > 400: call MD.   Indication:  Type 2 Diabetes     insulin aspart protamine- aspart (70-30) 100 UNIT/ML injection  Commonly known as:  NOVOLOG MIX 70/30  Inject 0.4 mLs (40 Units total) into the skin 2 (two) times daily with a meal. For diabetes management   Indication:  Type 2 Diabetes     INSULIN SYRINGE .5CC/29G 29G X 1/2" 0.5 ML Misc  1 vial by Does not apply route 2 (two) times daily. For diabetes management   Indication:  Diabetes management     metFORMIN 1000 MG tablet  Commonly known as:  GLUCOPHAGE  Take 1 tablet (1,000 mg total) by mouth 2 (two)  times daily with a meal. For diabetic managment   Indication:  Type 2 Diabetes     risperiDONE 2 MG disintegrating tablet  Commonly known as:  RISPERDAL M-TABS  Take 1 tablet (2 mg) daily & 2 tablets (4 mg) at bedtime: For mood control   Indication:  Mood control     traZODone 100 MG tablet  Commonly known as:  DESYREL  Take 2 tablets (200 mg total) by mouth at bedtime. For sleep   Indication:  Trouble Sleeping       Follow-up Information    Follow up with [facility].   Specialty:  Behavioral Health   Why:  When discharged, you will need to go to the walk in clinic. They are open Monday- Friday 8:00am- 10:00am. Take all of your hospital paper work.    Contact information:   [place]  [phone]       Follow up with [facility]     On 03/26/2015.   Contact information:   [address] [place] [address] [phone]     Follow-up recommendations: Activity:  As tolerated Diet: As recommended by your primary care doctor. Keep all scheduled follow-up appointments as recommended.   Comments: Take all your medications as prescribed by your mental healthcare provider. Report any adverse effects and or reactions from your medicines to your outpatient provider promptly. Patient is instructed and cautioned to not engage in alcohol and or illegal drug use while on prescription medicines. In the event of worsening symptoms, patient is instructed to call the crisis hotline, 911 and or go to the nearest ED for appropriate evaluation and treatment of symptoms. Follow-up with your primary care provider for your other medical issues, concerns and or health care needs.   Total Discharge Time: Greater than 30 minutes  Signed: Encarnacion Slates, PMHNP, FNp-BC 03/26/2015, 10:23 AM

## 2015-03-26 NOTE — [facility] Group Notes (Signed)
[facility] LCSW Group Therapy  03/26/2015 1:28 PM  Type of Therapy:  Group Therapy  Participation Level:  Did Not Attend  Modes of Intervention:  Discussion, Socialization and Support  Summary of Progress/Problems: Chaplain facilitated group around themes such hope and care.   [name],Christopher Blaylock 03/26/2015, 1:28 PM

## 2015-03-26 NOTE — Progress Notes (Signed)
Pt slept for a large portion of the evening, upon waking cordially greeted staff with "good morning". Pt spoke about how he slept well other than having some weird dreams. Pt blood sugar 313 this morning and was covered with novolog before going down to breakfast. Pt has been cooperative with care throughout the evening.

## 2015-03-29 ENCOUNTER — Encounter: Payer: Self-pay | Admitting: Internal Medicine

## 2015-03-29 ENCOUNTER — Ambulatory Visit: Payer: PPO | Attending: Internal Medicine | Admitting: Internal Medicine

## 2015-03-29 VITALS — BP 119/81 | HR 119 | Temp 98.3°F | Ht 73.0 in | Wt 152.6 lb

## 2015-03-29 DIAGNOSIS — F259 Schizoaffective disorder, unspecified: Secondary | ICD-10-CM | POA: Diagnosis not present

## 2015-03-29 DIAGNOSIS — M25551 Pain in right hip: Secondary | ICD-10-CM

## 2015-03-29 DIAGNOSIS — E1065 Type 1 diabetes mellitus with hyperglycemia: Secondary | ICD-10-CM | POA: Diagnosis not present

## 2015-03-29 DIAGNOSIS — F411 Generalized anxiety disorder: Secondary | ICD-10-CM | POA: Diagnosis not present

## 2015-03-29 DIAGNOSIS — IMO0002 Reserved for concepts with insufficient information to code with codable children: Secondary | ICD-10-CM

## 2015-03-29 DIAGNOSIS — M25559 Pain in unspecified hip: Secondary | ICD-10-CM | POA: Insufficient documentation

## 2015-03-29 DIAGNOSIS — E109 Type 1 diabetes mellitus without complications: Secondary | ICD-10-CM

## 2015-03-29 LAB — GLUCOSE, POCT (MANUAL RESULT ENTRY): POC GLUCOSE: 398 mg/dL — AB (ref 70–99)

## 2015-03-29 LAB — POCT URINALYSIS DIPSTICK
Bilirubin, UA: NEGATIVE
Blood, UA: NEGATIVE
GLUCOSE UA: 500
KETONES UA: 80
Leukocytes, UA: NEGATIVE
Nitrite, UA: NEGATIVE
PH UA: 6
Protein, UA: NEGATIVE
SPEC GRAV UA: 1.01
Urobilinogen, UA: 0.2

## 2015-03-29 MED ORDER — "INSULIN SYRINGE 29G X 1/2"" 0.5 ML MISC": 1.0000 | Freq: Two times a day (BID) | Status: DC

## 2015-03-29 MED ORDER — FREESTYLE SYSTEM KIT: 1.0000 | PACK | Status: DC | PRN

## 2015-03-29 MED ORDER — HYDROXYZINE HCL 25 MG PO TABS: 25.0000 mg | ORAL_TABLET | Freq: Three times a day (TID) | ORAL | Status: DC | PRN

## 2015-03-29 MED ORDER — DIVALPROEX SODIUM 500 MG PO DR TAB: DELAYED_RELEASE_TABLET | ORAL | Status: DC

## 2015-03-29 MED ORDER — FREESTYLE LANCETS MISC: Status: DC

## 2015-03-29 MED ORDER — BENZTROPINE MESYLATE 0.5 MG PO TABS: 0.5000 mg | ORAL_TABLET | Freq: Two times a day (BID) | ORAL | Status: DC

## 2015-03-29 MED ORDER — INSULIN ASPART PROT & ASPART (70-30 MIX) 100 UNIT/ML ~~LOC~~ SUSP: 40.0000 [IU] | Freq: Two times a day (BID) | SUBCUTANEOUS | Status: DC

## 2015-03-29 MED ORDER — GABAPENTIN 300 MG PO CAPS: 300.0000 mg | ORAL_CAPSULE | ORAL | Status: DC

## 2015-03-29 MED ORDER — INSULIN ASPART 100 UNIT/ML ~~LOC~~ SOLN: 0.0000 [IU] | Freq: Three times a day (TID) | SUBCUTANEOUS | Status: DC

## 2015-03-29 MED ORDER — ACETAMINOPHEN-CODEINE #3 300-30 MG PO TABS: 1.0000 | ORAL_TABLET | ORAL | Status: DC | PRN

## 2015-03-29 MED ORDER — TRAZODONE HCL 100 MG PO TABS: 200.0000 mg | ORAL_TABLET | Freq: Every day | ORAL | Status: DC

## 2015-03-29 MED ORDER — RISPERIDONE 2 MG PO TBDP: ORAL_TABLET | ORAL | Status: DC

## 2015-03-29 MED ORDER — GLUCOSE BLOOD VI STRP: ORAL_STRIP | Status: DC

## 2015-03-29 NOTE — Progress Notes (Signed)
Patient ID: [name], male   DOB: 09-23-1986, 29 y.o.   MRN: [id number]   [name], is a 29 y.o. male  KCL:[id number]  CBS:[id number]  DOB - 1986-08-27  Chief Complaint  Patient presents with  . Hospitalization Follow-up  . Diabetes        Subjective:   [name] is a 29 y.o. male here today for a follow up visit. Patient has multiple medical history including type 1 diabetes mellitus, schizoaffective disorder, polysubstance abuse, chronic pain syndrome, scoliosis, bilateral hip arthritis noncompliance with medication and diet, repeated admissions for DKA in the past, anxiety and major depression. He follows up with endocrinologist for his uncontrolled diabetes. He was recently seen and admitted to the behavioral Hospital because of some bizarre behavior. Patient was reported to be saying he is from [employer] [place] and [place] He was talkative with pressured speech but denied suicidal ideation, homicidal ideation or paranoia. Patient's blood sugar was labile during admission but largely uncontrolled, there has been some adjustment made to his medications and metformin was added. Patient is here today for hospital follow-up. He has no new complaints except that he thinks his blood sugar is high that he may be going into DKA. CBG today is 398, patient has not used his insulin today, he said he ran out of his insulin and he is not able to afford anymore prescription. Patient also reports taking some Adderall for ADHD. Patient has No headache, No chest pain, No abdominal pain - No Nausea, No new weakness tingling or numbness, No Cough - SOB.  Problem  Hip Pain  Schizoaffective Disorder, Unspecified Type  Generalized Anxiety Disorder    ALLERGIES: Allergies  Allergen Reactions  . Sulfa Antibiotics Other (See Comments)    Unknown - Childhood    PAST MEDICAL HISTORY: Past Medical History  Diagnosis Date  . Diabetes mellitus   .  Depression   . Schizo affective schizophrenia   . Polysubstance abuse   . Scoliosis   . Chronic pain   . Noncompliance with medication regimen   . DKA (diabetic ketoacidoses) 11/18/2014  . GERD (gastroesophageal reflux disease)   . Arthritis     "both hips" (12/21/2014)    MEDICATIONS AT HOME: Prior to Admission medications   Medication Sig Start Date End Date Taking? Authorizing Provider  amphetamine-dextroamphetamine (ADDERALL) 20 MG tablet Take 40 mg by mouth 2 (two) times daily.   Yes Historical Provider, MD  benztropine (COGENTIN) 0.5 MG tablet Take 1 tablet (0.5 mg total) by mouth 2 (two) times daily. For prevention of drug induced tremors 03/29/15  Yes Tresa Garter, MD  divalproex (DEPAKOTE) 500 MG DR tablet Take 1 Tablet (500 mg) daily & 1.5 tablets (750 mg) at bedtime: For mood stabilization 03/29/15  Yes Tresa Garter, MD  gabapentin (NEURONTIN) 300 MG capsule Take 1 capsule (300 mg total) by mouth 3 (three) times daily at 8am, 3pm and bedtime. For agitation 03/29/15  Yes Tresa Garter, MD  glucose blood (FREESTYLE LITE) test strip Use as instructed 03/26/15  Yes Encarnacion Slates, NP  glucose monitoring kit (FREESTYLE) monitoring kit 1 each by Does not apply route as needed for other. Test blood sugars three daily and when needed: For blood sugar checks 03/26/15  Yes Encarnacion Slates, NP  hydrOXYzine (ATARAX/VISTARIL) 25 MG tablet Take 1 tablet (25 mg total) by mouth 3 (three) times daily as needed for anxiety. 03/29/15  Yes Lanay Zinda E  Doreene Burke, MD  insulin aspart (NOVOLOG) 100 UNIT/ML injection Inject 0-9 Units into the skin 3 (three) times daily with meals. CBG < 70: Eat or drink something sweet and recheck. CBG 70 - 120: 0 units CBG 121 - 150: 1 unit CBG 151 - 200: 2 units: Blood sugar sliding scale insulin coverage. CBG 201 - 250: 3 units CBG 251 - 300: 5 units CBG 301 - 350: 7 units CBG 351 - 400: 9 units CBG > 400: call MD. 03/29/15  Yes Tresa Garter, MD    insulin aspart protamine- aspart (NOVOLOG MIX 70/30) (70-30) 100 UNIT/ML injection Inject 0.4 mLs (40 Units total) into the skin 2 (two) times daily with a meal. For diabetes management 03/29/15  Yes Pernell Dikes Essie Christine, MD  INSULIN SYRINGE .5CC/29G 29G X 1/2" 0.5 ML MISC 1 vial by Does not apply route 2 (two) times daily. For diabetes management 03/29/15  Yes Tresa Garter, MD  Lancets (FREESTYLE) lancets Use as instructed: For blood sugar checks 03/26/15  Yes Encarnacion Slates, NP  risperiDONE (RISPERDAL M-TABS) 2 MG disintegrating tablet Take 1 tablet (2 mg) daily & 2 tablets (4 mg) at bedtime: For mood control 03/29/15  Yes Tresa Garter, MD  traZODone (DESYREL) 100 MG tablet Take 2 tablets (200 mg total) by mouth at bedtime. For sleep 03/29/15  Yes Tresa Garter, MD  acetaminophen-codeine (TYLENOL #3) 300-30 MG per tablet Take 1 tablet by mouth every 4 (four) hours as needed. 03/29/15   Tresa Garter, MD  metFORMIN (GLUCOPHAGE) 1000 MG tablet Take 1 tablet (1,000 mg total) by mouth 2 (two) times daily with a meal. For diabetic managment Patient not taking: Reported on 03/29/2015 03/26/15   Encarnacion Slates, NP     Objective:   Filed Vitals:   03/29/15 1046  BP: 119/81  Pulse: 119  Temp: 98.3 F (36.8 C)  TempSrc: Oral  Height: $Remove'6\' 1"'IxaiwPA$  (1.854 m)  Weight: 152 lb 9.6 oz (69.219 kg)    Exam General appearance : Awake, alert, not in any distress. Speech Clear. Not toxic looking HEENT: Atraumatic and Normocephalic, pupils equally reactive to light and accomodation Neck: supple, no JVD. No cervical lymphadenopathy.  Chest:Good air entry bilaterally, no added sounds  CVS: S1 S2 regular, no murmurs.  Abdomen: Bowel sounds present, Non tender and not distended with no gaurding, rigidity or rebound. Extremities: B/L Lower Ext shows no edema, both legs are warm to touch Neurology: Awake alert, and oriented X 3, CN II-XII intact, Non focal Skin:No Rash  Data Review Lab Results   Component Value Date   HGBA1C 12.0* 03/22/2015   HGBA1C 12.0* 01/23/2015   HGBA1C 19.2* 12/22/2014     Assessment & Plan   1. Diabetes type 1, uncontrolled  - Glucose (CBG) - Urinalysis Dipstick - insulin aspart (NOVOLOG) 100 UNIT/ML injection; Inject 0-9 Units into the skin 3 (three) times daily with meals. CBG < 70: Eat or drink something sweet and recheck. CBG 70 - 120: 0 units CBG 121 - 150: 1 unit CBG 151 - 200: 2 units: Blood sugar sliding scale insulin coverage. CBG 201 - 250: 3 units CBG 251 - 300: 5 units CBG 301 - 350: 7 units CBG 351 - 400: 9 units CBG > 400: call MD.  Dispense: 10 mL; Refill: 11 - insulin aspart protamine- aspart (NOVOLOG MIX 70/30) (70-30) 100 UNIT/ML injection; Inject 0.4 mLs (40 Units total) into the skin 2 (two) times daily with a meal. For diabetes management  Dispense: 30 mL; Refill: 3 - INSULIN SYRINGE .5CC/29G 29G X 1/2" 0.5 ML MISC; 1 vial by Does not apply route 2 (two) times daily. For diabetes management  Dispense: 200 each; Refill: 3  Extensive education was again provided on blood sugar control and the consequences of uncontrolled diabetes mellitus both acutely and chronically. Patient has been advised to check blood sugars at least 2 times per day and bring the log to the clinic next visit. Patient verbalized understanding.  Aim for 30 minutes of exercise most days. Rethink what you drink. Water is great! Aim for 2-3 Carb Choices per meal (30-45 grams) +/- 1 either way  Aim for 0-15 Carbs per snack if hungry  Include protein in moderation with your meals and snacks  Consider reading food labels for Total Carbohydrate and Fat Grams of foods  Consider checking BG at alternate times per day  Continue taking medication as directed Be mindful about how much sugar you are adding to beverages and other foods. Fruit Punch - find one with no sugar  Measure and decrease portions of carbohydrate foods  Make your plate and don't go back for  seconds  2. Hip pain, right  - gabapentin (NEURONTIN) 300 MG capsule; Take 1 capsule (300 mg total) by mouth 3 (three) times daily at 8am, 3pm and bedtime. For agitation  Dispense: 90 capsule; Refill: 3 - acetaminophen-codeine (TYLENOL #3) 300-30 MG per tablet; Take 1 tablet by mouth every 4 (four) hours as needed.  Dispense: 60 tablet; Refill: 0  3. Schizoaffective disorder, unspecified type  - benztropine (COGENTIN) 0.5 MG tablet; Take 1 tablet (0.5 mg total) by mouth 2 (two) times daily. For prevention of drug induced tremors  Dispense: 60 tablet; Refill: 3 - divalproex (DEPAKOTE) 500 MG DR tablet; Take 1 Tablet (500 mg) daily & 1.5 tablets (750 mg) at bedtime: For mood stabilization  Dispense: 75 tablet; Refill: 3 - risperiDONE (RISPERDAL M-TABS) 2 MG disintegrating tablet; Take 1 tablet (2 mg) daily & 2 tablets (4 mg) at bedtime: For mood control  Dispense: 90 tablet; Refill: 3  4. Generalized anxiety disorder  - hydrOXYzine (ATARAX/VISTARIL) 25 MG tablet; Take 1 tablet (25 mg total) by mouth 3 (three) times daily as needed for anxiety.  Dispense: 60 tablet; Refill: 3 - traZODone (DESYREL) 100 MG tablet; Take 2 tablets (200 mg total) by mouth at bedtime. For sleep  Dispense: 60 tablet; Refill: 3  Patient have been counseled extensively about nutrition and exercise Return in about 2 weeks (around 04/12/2015) for CBG, Lab/Nurse Visit.  The patient was given clear instructions to go to ER or return to medical center if symptoms don't improve, worsen or new problems develop. The patient verbalized understanding. The patient was told to call to get lab results if they haven't heard anything in the next week.   This note has been created with Surveyor, quantity. Any transcriptional errors are unintentional.    Angelica Chessman, MD, [place], [place], [place], [place] and [facility] [place], [place]   03/29/2015,  11:25 AM

## 2015-03-29 NOTE — Patient Instructions (Signed)
Diabetes and Exercise Exercising regularly is important. It is not just about losing weight. It has many health benefits, such as:  Improving your overall fitness, flexibility, and endurance.  Increasing your bone density.  Helping with weight control.  Decreasing your body fat.  Increasing your muscle strength.  Reducing stress and tension.  Improving your overall health. People with diabetes who exercise gain additional benefits because exercise:  Reduces appetite.  Improves the body's use of blood sugar (glucose).  Helps lower or control blood glucose.  Decreases blood pressure.  Helps control blood lipids (such as cholesterol and triglycerides).  Improves the body's use of the hormone insulin by:  Increasing the body's insulin sensitivity.  Reducing the body's insulin needs.  Decreases the risk for heart disease because exercising:  Lowers cholesterol and triglycerides levels.  Increases the levels of good cholesterol (such as high-density lipoproteins [HDL]) in the body.  Lowers blood glucose levels. YOUR ACTIVITY PLAN  Choose an activity that you enjoy and set realistic goals. Your health care provider or diabetes educator can help you make an activity plan that works for you. Exercise regularly as directed by your health care provider. This includes:  Performing resistance training twice a week such as push-ups, sit-ups, lifting weights, or using resistance bands.  Performing 150 minutes of cardio exercises each week such as walking, running, or playing sports.  Staying active and spending no more than 90 minutes at one time being inactive. Even short bursts of exercise are good for you. Three 10-minute sessions spread throughout the day are just as beneficial as a single 30-minute session. Some exercise ideas include:  Taking the dog for a walk.  Taking the stairs instead of the elevator.  Dancing to your favorite song.  Doing an exercise  video.  Doing your favorite exercise with a friend. RECOMMENDATIONS FOR EXERCISING WITH TYPE 1 OR TYPE 2 DIABETES   Check your blood glucose before exercising. If blood glucose levels are greater than 240 mg/dL, check for urine ketones. Do not exercise if ketones are present.  Avoid injecting insulin into areas of the body that are going to be exercised. For example, avoid injecting insulin into:  The arms when playing tennis.  The legs when jogging.  Keep a record of:  Food intake before and after you exercise.  Expected peak times of insulin action.  Blood glucose levels before and after you exercise.  The type and amount of exercise you have done.  Review your records with your health care provider. Your health care provider will help you to develop guidelines for adjusting food intake and insulin amounts before and after exercising.  If you take insulin or oral hypoglycemic agents, watch for signs and symptoms of hypoglycemia. They include:  Dizziness.  Shaking.  Sweating.  Chills.  Confusion.  Drink plenty of water while you exercise to prevent dehydration or heat stroke. Body water is lost during exercise and must be replaced.  Talk to your health care provider before starting an exercise program to make sure it is safe for you. Remember, almost any type of activity is better than none. Document Released: 02/24/2004 Document Revised: 04/20/2014 Document Reviewed: 05/13/2013 ExitCare Patient Information 2015 [employer]. This information is not intended to replace advice given to you by your health care provider. Make sure you discuss any questions you have with your health care provider.  

## 2015-03-29 NOTE — Progress Notes (Signed)
Patient presents today for a hospital follow up. Patient's A1C in hospital was 12.0. Patient believes he might be going into DKA, collected CBG which is 398. patient has not been taking insulin due to him being out and not being able to afford it at the moment. Patient also reports that he is taking Adderral 20mg  tablets. 40mg  bid

## 2015-03-30 NOTE — Progress Notes (Signed)
Patient Discharge Instructions:  After Visit Summary (AVS):   Faxed to:  03/30/15 Discharge Summary Note:   Faxed to:  03/30/15 Psychiatric Admission Assessment Note:   Faxed to:  03/30/15 Suicide Risk Assessment - Discharge Assessment:   Faxed to:  03/30/15 Faxed/Sent to the Next Level Care provider:  03/30/15 Next Level Care Provider Has Access to the EMR, 03/30/15  Faxed to Emma @ [phone] Records provided to [facility] via [facility]/Epic access.  Jerelene Redden, 03/30/2015, 4:04 PM

## 2015-03-31 ENCOUNTER — Emergency Department (HOSPITAL_COMMUNITY)
Admission: EM | Admit: 2015-03-31 | Discharge: 2015-04-01 | Disposition: A | Discharge status: Home / Self Care | Payer: PPO | Attending: Emergency Medicine | Admitting: Emergency Medicine

## 2015-03-31 ENCOUNTER — Encounter (HOSPITAL_COMMUNITY): Payer: Self-pay | Admitting: Emergency Medicine

## 2015-03-31 DIAGNOSIS — G479 Sleep disorder, unspecified: Secondary | ICD-10-CM | POA: Insufficient documentation

## 2015-03-31 DIAGNOSIS — E109 Type 1 diabetes mellitus without complications: Secondary | ICD-10-CM | POA: Insufficient documentation

## 2015-03-31 DIAGNOSIS — Z720 Tobacco use: Secondary | ICD-10-CM | POA: Insufficient documentation

## 2015-03-31 DIAGNOSIS — Z8719 Personal history of other diseases of the digestive system: Secondary | ICD-10-CM | POA: Diagnosis not present

## 2015-03-31 DIAGNOSIS — F329 Major depressive disorder, single episode, unspecified: Secondary | ICD-10-CM | POA: Diagnosis not present

## 2015-03-31 DIAGNOSIS — F911 Conduct disorder, childhood-onset type: Secondary | ICD-10-CM | POA: Diagnosis not present

## 2015-03-31 DIAGNOSIS — F22 Delusional disorders: Secondary | ICD-10-CM | POA: Insufficient documentation

## 2015-03-31 DIAGNOSIS — M1990 Unspecified osteoarthritis, unspecified site: Secondary | ICD-10-CM | POA: Insufficient documentation

## 2015-03-31 DIAGNOSIS — M419 Scoliosis, unspecified: Secondary | ICD-10-CM | POA: Insufficient documentation

## 2015-03-31 DIAGNOSIS — Z794 Long term (current) use of insulin: Secondary | ICD-10-CM | POA: Diagnosis not present

## 2015-03-31 DIAGNOSIS — IMO0002 Reserved for concepts with insufficient information to code with codable children: Secondary | ICD-10-CM

## 2015-03-31 DIAGNOSIS — Z79899 Other long term (current) drug therapy: Secondary | ICD-10-CM | POA: Insufficient documentation

## 2015-03-31 DIAGNOSIS — F209 Schizophrenia, unspecified: Secondary | ICD-10-CM | POA: Diagnosis not present

## 2015-03-31 DIAGNOSIS — G8929 Other chronic pain: Secondary | ICD-10-CM | POA: Insufficient documentation

## 2015-03-31 DIAGNOSIS — E1065 Type 1 diabetes mellitus with hyperglycemia: Secondary | ICD-10-CM

## 2015-03-31 DIAGNOSIS — F419 Anxiety disorder, unspecified: Secondary | ICD-10-CM | POA: Diagnosis not present

## 2015-03-31 DIAGNOSIS — R440 Auditory hallucinations: Secondary | ICD-10-CM | POA: Diagnosis present

## 2015-03-31 LAB — COMPREHENSIVE METABOLIC PANEL
ALBUMIN: 4.2 g/dL (ref 3.5–5.2)
ALT: 24 U/L (ref 0–53)
AST: 25 U/L (ref 0–37)
Alkaline Phosphatase: 55 U/L (ref 39–117)
Anion gap: 8 (ref 5–15)
BILIRUBIN TOTAL: 0.4 mg/dL (ref 0.3–1.2)
BUN: 18 mg/dL (ref 6–23)
CO2: 32 mmol/L (ref 19–32)
CREATININE: 0.57 mg/dL (ref 0.50–1.35)
Calcium: 9.3 mg/dL (ref 8.4–10.5)
Chloride: 103 mmol/L (ref 96–112)
GFR calc Af Amer: >90 mL/min (ref >90)
Glucose, Bld: 75 mg/dL (ref 70–99)
Potassium: 3.4 mmol/L — ABNORMAL LOW (ref 3.5–5.1)
Sodium: 143 mmol/L (ref 135–145)
TOTAL PROTEIN: 6.5 g/dL (ref 6.0–8.3)

## 2015-03-31 LAB — CBC
HCT: 37.8 % — ABNORMAL LOW (ref 39.0–52.0)
Hemoglobin: 13 g/dL (ref 13.0–17.0)
MCH: 30.5 pg (ref 26.0–34.0)
MCHC: 34.4 g/dL (ref 30.0–36.0)
MCV: 88.7 fL (ref 78.0–100.0)
Platelets: 267 10*3/uL (ref 150–400)
RBC: 4.26 MIL/uL (ref 4.22–5.81)
RDW: 13.7 % (ref 11.5–15.5)
WBC: 8.3 10*3/uL (ref 4.0–10.5)

## 2015-03-31 LAB — ACETAMINOPHEN LEVEL

## 2015-03-31 LAB — SALICYLATE LEVEL

## 2015-03-31 LAB — ETHANOL

## 2015-03-31 MED ORDER — LORAZEPAM 1 MG PO TABS
1.0000 mg | ORAL_TABLET | Freq: Three times a day (TID) | ORAL | Status: DC | PRN
  Administered 2015-04-01: 1 mg via ORAL
  Filled 2015-03-31: qty 1

## 2015-03-31 MED ORDER — ZIPRASIDONE MESYLATE 20 MG IM SOLR
20.0000 mg | Freq: Once | INTRAMUSCULAR | Status: AC
  Administered 2015-03-31: 20 mg via INTRAMUSCULAR
  Filled 2015-03-31: qty 20

## 2015-03-31 MED ORDER — LORAZEPAM 2 MG/ML IJ SOLN
1.0000 mg | Freq: Once | INTRAMUSCULAR | Status: AC
  Administered 2015-03-31: 1 mg via INTRAMUSCULAR
  Filled 2015-03-31: qty 1

## 2015-03-31 MED ORDER — INSULIN ASPART 100 UNIT/ML ~~LOC~~ SOLN: 0.0000 [IU] | Freq: Three times a day (TID) | SUBCUTANEOUS | Status: DC

## 2015-03-31 MED ORDER — DIPHENHYDRAMINE HCL 50 MG/ML IJ SOLN
50.0000 mg | Freq: Once | INTRAMUSCULAR | Status: AC
  Administered 2015-03-31: 50 mg via INTRAMUSCULAR
  Filled 2015-03-31: qty 1

## 2015-03-31 MED ORDER — INSULIN ASPART PROT & ASPART (70-30 MIX) 100 UNIT/ML ~~LOC~~ SUSP
40.0000 [IU] | Freq: Two times a day (BID) | SUBCUTANEOUS | Status: DC
  Administered 2015-04-01: 40 [IU] via SUBCUTANEOUS
  Filled 2015-03-31: qty 10

## 2015-03-31 MED ORDER — LORAZEPAM 1 MG PO TABS
1.0000 mg | ORAL_TABLET | Freq: Once | ORAL | Status: AC
  Administered 2015-03-31: 1 mg via ORAL
  Filled 2015-03-31: qty 1

## 2015-03-31 MED ORDER — GABAPENTIN 300 MG PO CAPS
300.0000 mg | ORAL_CAPSULE | ORAL | Status: DC
Start: 2015-03-31 — End: 2015-04-01
  Administered 2015-03-31 – 2015-04-01 (×2): 300 mg via ORAL
  Filled 2015-03-31 (×2): qty 1

## 2015-03-31 MED ORDER — "INSULIN SYRINGE 29G X 1/2"" 0.5 ML MISC": 1.0000 | Freq: Two times a day (BID) | Status: DC

## 2015-03-31 MED ORDER — METFORMIN HCL 500 MG PO TABS: 1000.0000 mg | ORAL_TABLET | Freq: Two times a day (BID) | ORAL | Status: DC

## 2015-03-31 MED ORDER — AMPHETAMINE-DEXTROAMPHETAMINE 20 MG PO TABS: 40.0000 mg | ORAL_TABLET | Freq: Two times a day (BID) | ORAL | Status: DC

## 2015-03-31 MED ORDER — TRAZODONE HCL 100 MG PO TABS
200.0000 mg | ORAL_TABLET | Freq: Every day | ORAL | Status: DC
  Administered 2015-03-31: 200 mg via ORAL
  Filled 2015-03-31: qty 2

## 2015-03-31 MED ORDER — DIVALPROEX SODIUM 500 MG PO DR TAB
500.0000 mg | DELAYED_RELEASE_TABLET | Freq: Two times a day (BID) | ORAL | Status: DC
  Administered 2015-03-31 – 2015-04-01 (×2): 500 mg via ORAL
  Filled 2015-03-31 (×2): qty 1

## 2015-03-31 NOTE — BH Assessment (Signed)
Tele Assessment Note   [name] is a 29 y.o. male who voluntarily presents to [facility] for psych eval.  Pt was brought BIB police dept.  This writer attempted to interview pt, but he was manic with pressured and rapid speech and responding to internal stimuli.  Pt was able to answer some questions and needed to be re-directed at times. Pt was somewhat agitated and had to be calmed down by nurse and reportedly was aggressive with Dr. Amie Critchley).  Pt stated that he is a member of the [employer] and the agency implanted a device in his head.  Pt says his anger has increased and he can't yelling at people and cannot stop talking.  Pt observed by nurses talking to himself while walking to the bathroom.  Pt was recently d/c'd from [facility] on 03/26/15 and says his medications are not working for him.  Pt says he is self-medicating by drinking alcohol--2 four locos.  His last drink was 2 days ago. Pt denies SI/HI ar present.    Axis I: Schizophrenia; Alcohol use disorder Axis II: Deferred Axis III:  Past Medical History  Diagnosis Date  . Diabetes mellitus   . Depression   . Schizo affective schizophrenia   . Polysubstance abuse   . Scoliosis   . Chronic pain   . Noncompliance with medication regimen   . DKA (diabetic ketoacidoses) 11/18/2014  . GERD (gastroesophageal reflux disease)   . Arthritis     "both hips" (12/21/2014)   Axis IV: other psychosocial or environmental problems, problems related to social environment and problems with primary support group Axis V: 31-40 impairment in reality testing  Past Medical History:  Past Medical History  Diagnosis Date  . Diabetes mellitus   . Depression   . Schizo affective schizophrenia   . Polysubstance abuse   . Scoliosis   . Chronic pain   . Noncompliance with medication regimen   . DKA (diabetic ketoacidoses) 11/18/2014  . GERD (gastroesophageal reflux disease)   . Arthritis     "both hips" (12/21/2014)    History reviewed. No  pertinent past surgical history.  Family History:  Family History  Problem Relation Age of Onset  . Diabetes Mother     Social History:  reports that he has been smoking Cigarettes.  He has a 7 pack-year smoking history. He has never used smokeless tobacco. He reports that he drinks alcohol. He reports that he uses illicit drugs (Marijuana).  Additional Social History:  Alcohol / Drug Use Pain Medications: See MAR  Prescriptions: See MAR  Over the Counter: See MAR  History of alcohol / drug use?: Yes Longest period of sobriety (when/how long): None  Negative Consequences of Use: Work / Programmer, multimedia, Personal relationships Withdrawal Symptoms: Other (Comment) (No w/d sxs ) Substance #1 Name of Substance 1: Alcohol  1 - Age of First Use: Teens  1 - Amount (size/oz): 2--Four Locos  1 - Frequency: 3x's Wkly  1 - Duration: On-going  1 - Last Use / Amount: 2 Days Ago   CIWA:   COWS:    PATIENT STRENGTHS: (choose at least two) Motivation for treatment/growth  Allergies:  Allergies  Allergen Reactions  . Sulfa Antibiotics Other (See Comments)    Unknown - Childhood    Home Medications:  (Not in a hospital admission)  OB/GYN Status:  No LMP for male patient.  General Assessment Data Location of Assessment: [facility] ED Is this a Tele or Face-to-Face Assessment?: Tele Assessment Is this an  Initial Assessment or a Re-assessment for this encounter?: Initial Assessment Living Arrangements: Alone Can pt return to current living arrangement?: Yes Admission Status: Voluntary Is patient capable of signing voluntary admission?: No Transfer from: Home Referral Source: Self/Family/Friend  Medical Screening Exam [facility] Walk-in ONLY) Medical Exam completed: No Reason for MSE not completed: Other: (None )  [facility] Crisis Care Plan Living Arrangements: Alone Name of Psychiatrist: None  Name of Therapist: None   Education Status Is patient currently in school?: No Current Grade: None  Highest  grade of school patient has completed: 8th grade Name of school: None  Contact person: None   Risk to self with the past 6 months Suicidal Ideation: No Suicidal Intent: No Is patient at risk for suicide?: No Suicidal Plan?: No Access to Means: No Specify Access to Suicidal Means: None  What has been your use of drugs/alcohol within the last 12 months?: Pt using alcohol to help him calm down  Previous Attempts/Gestures: Yes How many times?: 10 Other Self Harm Risks: None  Triggers for Past Attempts: Unpredictable Intentional Self Injurious Behavior: None Family Suicide History: No Recent stressful life event(s): Other (Comment) (Recent d/c from [facility] on 03/26/15; Meds not working ) Persecutory voices/beliefs?: Yes Depression: Yes Depression Symptoms: Feeling angry/irritable Substance abuse history and/or treatment for substance abuse?: Yes Suicide prevention information given to non-admitted patients: Not applicable  Risk to Others within the past 6 months Homicidal Ideation: No Thoughts of Harm to Others: No Current Homicidal Intent: No Current Homicidal Plan: No Access to Homicidal Means: No Identified Victim: None  History of harm to others?: No Assessment of Violence: None Noted Violent Behavior Description: None  Does patient have access to weapons?: No Criminal Charges Pending?: No Does patient have a court date: No  Psychosis Hallucinations: Auditory Delusions: Unspecified  Mental Status Report Appearance/Hygiene: Disheveled, In scrubs Eye Contact: Good Motor Activity: Unremarkable Speech: Tangential, Pressured, Loud, Rapid Level of Consciousness: Alert Mood: Preoccupied Affect: Preoccupied, Silly Anxiety Level: Moderate Thought Processes: Tangential Judgement: Impaired Orientation: Person, Place, Situation Obsessive Compulsive Thoughts/Behaviors: Minimal  Cognitive Functioning Concentration: Decreased Memory: Recent Intact, Remote Intact IQ:  Average Insight: Poor Impulse Control: Poor Appetite: Good Weight Loss: 0 Weight Gain: 0 Sleep: No Change Total Hours of Sleep: 6 Vegetative Symptoms: None  ADLScreening [facility] Assessment Services) Patient's cognitive ability adequate to safely complete daily activities?: Yes Patient able to express need for assistance with ADLs?: Yes Independently performs ADLs?: Yes (appropriate for developmental age)  Prior Inpatient Therapy Prior Inpatient Therapy: Yes Prior Therapy Dates: 2013, 2016 Prior Therapy Facilty/Provider(s): [facility]  Reason for Treatment: Schizophrenia   Prior Outpatient Therapy Prior Outpatient Therapy: No Prior Therapy Dates: None  Prior Therapy Facilty/Provider(s): None  Reason for Treatment: None   ADL Screening (condition at time of admission) Patient's cognitive ability adequate to safely complete daily activities?: Yes Is the patient deaf or have difficulty hearing?: No Does the patient have difficulty seeing, even when wearing glasses/contacts?: No Does the patient have difficulty concentrating, remembering, or making decisions?: Yes Patient able to express need for assistance with ADLs?: Yes Does the patient have difficulty dressing or bathing?: No Independently performs ADLs?: Yes (appropriate for developmental age) Does the patient have difficulty walking or climbing stairs?: No Weakness of Legs: None Weakness of Arms/Hands: None  Home Assistive Devices/Equipment Home Assistive Devices/Equipment: None  Therapy Consults (therapy consults require a physician order) PT Evaluation Needed: No OT Evalulation Needed: No SLP Evaluation Needed: No Abuse/Neglect Assessment (Assessment to be complete while patient  is alone) Physical Abuse: Denies Verbal Abuse: Denies Sexual Abuse: Denies Exploitation of patient/patient's resources: Denies Values / Beliefs Cultural Requests During Hospitalization: None Spiritual Requests During Hospitalization:  None Consults Spiritual Care Consult Needed: No Social Work Consult Needed: No Merchant navy officer (For Healthcare) Does patient have an advance directive?: No    Additional Information 1:1 In Past 12 Months?: No CIRT Risk: No Elopement Risk: No Does patient have medical clearance?: Yes     Disposition:  Disposition Initial Assessment Completed for this Encounter: Yes Disposition of Patient: Inpatient treatment program, Referred to (Per Donell Sievert, PA meets criteria for inpt admission ) Type of inpatient treatment program: Adult Other disposition(s): Other (Comment) (Per Donell Sievert, PA meets criteria for inpt admission ) Patient referred to: Other (Comment) (Per Donell Sievert, PA meets criteria for inpt admission )  Murrell Redden 03/31/2015 8:21 PM

## 2015-03-31 NOTE — ED Notes (Signed)
Pt BIB voluntarily by [employer].  Pt obviously responding to internal stimuli.  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  Denies SI/HI.  States he cannot stop talking or yelling at people.  Pt continues talking when going into the bathroom by himself.

## 2015-03-31 NOTE — ED Notes (Signed)
Bed: PNP00 Expected date:  Expected time:  Means of arrival:  Comments: Hold for [facility]

## 2015-03-31 NOTE — ED Provider Notes (Signed)
CSN: [id number]     Arrival date & time 03/31/15  1824 History   First MD Initiated Contact with Patient 03/31/15 1832     Chief Complaint  Patient presents with  . Hallucinations     (Consider location/radiation/quality/duration/timing/severity/associated sxs/prior Treatment) HPI Patient presents via [employer] after being acting inappropriate. On my evaluation the patient is initially talking nonsensically, stating that he is from the future, and asking if I am from the past.  He is yelling, aggressive, but briefly directable to answer questions such as his home medication, and whether or not he is in pain or discomfort. Patient denies any pain or discomfort, or nausea or He does acknowledge feeling agitated, sleepless, excitable, denies suicidal thoughts. He states that he has been unable to appropriately take all medication as prescribed, is concerned that he is being prescribed excessive amounts of medication beyond those he needs.   Past Medical History  Diagnosis Date  . Diabetes mellitus   . Depression   . Schizo affective schizophrenia   . Polysubstance abuse   . Scoliosis   . Chronic pain   . Noncompliance with medication regimen   . DKA (diabetic ketoacidoses) 11/18/2014  . GERD (gastroesophageal reflux disease)   . Arthritis     "both hips" (12/21/2014)   History reviewed. No pertinent past surgical history. Family History  Problem Relation Age of Onset  . Diabetes Mother    History  Substance Use Topics  . Smoking status: Current Some Day Smoker -- 1.00 packs/day for 7 years    Types: Cigarettes  . Smokeless tobacco: Never Used  . Alcohol Use: Yes     Comment: 12/21/2014 "I drink on a rare occasion"    Review of Systems  Constitutional:       Per HPI, otherwise negative  HENT:       Per HPI, otherwise negative  Respiratory:       Per HPI, otherwise negative  Cardiovascular:       Per HPI, otherwise negative  Gastrointestinal: Negative for vomiting.   Endocrine:       Negative aside from HPI  Genitourinary:       Neg aside from HPI   Musculoskeletal:       Per HPI, otherwise negative  Skin: Negative for wound.  Allergic/Immunologic: Negative for immunocompromised state.  Neurological: Negative for syncope.  Psychiatric/Behavioral: Positive for behavioral problems, sleep disturbance, dysphoric mood, decreased concentration and agitation. Negative for suicidal ideas, hallucinations and confusion. The patient is nervous/anxious and is hyperactive.       Allergies  Sulfa antibiotics  Home Medications   Prior to Admission medications   Medication Sig Start Date End Date Taking? Authorizing Provider  acetaminophen-codeine (TYLENOL #3) 300-30 MG per tablet Take 1 tablet by mouth every 4 (four) hours as needed. 03/29/15   Tresa Garter, MD  amphetamine-dextroamphetamine (ADDERALL) 20 MG tablet Take 40 mg by mouth 2 (two) times daily.    Historical Provider, MD  benztropine (COGENTIN) 0.5 MG tablet Take 1 tablet (0.5 mg total) by mouth 2 (two) times daily. For prevention of drug induced tremors 03/29/15   Tresa Garter, MD  divalproex (DEPAKOTE) 500 MG DR tablet Take 1 Tablet (500 mg) daily & 1.5 tablets (750 mg) at bedtime: For mood stabilization 03/29/15   Tresa Garter, MD  gabapentin (NEURONTIN) 300 MG capsule Take 1 capsule (300 mg total) by mouth 3 (three) times daily at 8am, 3pm and bedtime. For agitation 03/29/15   Olugbemiga E Doreene Burke,  MD  glucose blood (FREESTYLE LITE) test strip Use as instructed 03/29/15   Tresa Garter, MD  glucose monitoring kit (FREESTYLE) monitoring kit 1 each by Does not apply route as needed for other. Test blood sugars three daily and when needed: For blood sugar checks 03/29/15   Tresa Garter, MD  hydrOXYzine (ATARAX/VISTARIL) 25 MG tablet Take 1 tablet (25 mg total) by mouth 3 (three) times daily as needed for anxiety. 03/29/15   Tresa Garter, MD  insulin aspart (NOVOLOG)  100 UNIT/ML injection Inject 0-9 Units into the skin 3 (three) times daily with meals. CBG < 70: Eat or drink something sweet and recheck. CBG 70 - 120: 0 units CBG 121 - 150: 1 unit CBG 151 - 200: 2 units: Blood sugar sliding scale insulin coverage. CBG 201 - 250: 3 units CBG 251 - 300: 5 units CBG 301 - 350: 7 units CBG 351 - 400: 9 units CBG > 400: call MD. 03/29/15   Tresa Garter, MD  insulin aspart protamine- aspart (NOVOLOG MIX 70/30) (70-30) 100 UNIT/ML injection Inject 0.4 mLs (40 Units total) into the skin 2 (two) times daily with a meal. For diabetes management 03/29/15   Tresa Garter, MD  INSULIN SYRINGE .5CC/29G 29G X 1/2" 0.5 ML MISC 1 vial by Does not apply route 2 (two) times daily. For diabetes management 03/29/15   Tresa Garter, MD  Lancets (FREESTYLE) lancets Use as instructed: For blood sugar checks 03/29/15   Tresa Garter, MD  metFORMIN (GLUCOPHAGE) 1000 MG tablet Take 1 tablet (1,000 mg total) by mouth 2 (two) times daily with a meal. For diabetic managment Patient not taking: Reported on 03/29/2015 03/26/15   Encarnacion Slates, NP  risperiDONE (RISPERDAL M-TABS) 2 MG disintegrating tablet Take 1 tablet (2 mg) daily & 2 tablets (4 mg) at bedtime: For mood control 03/29/15   Tresa Garter, MD  traZODone (DESYREL) 100 MG tablet Take 2 tablets (200 mg total) by mouth at bedtime. For sleep 03/29/15   Tresa Garter, MD   There were no vitals taken for this visit. Physical Exam  Constitutional: He is oriented to person, place, and time. He appears well-developed. No distress.  HENT:  Head: Normocephalic and atraumatic.  Eyes: Conjunctivae and EOM are normal.  Cardiovascular: Normal rate and regular rhythm.   Pulmonary/Chest: Effort normal. No stridor. No respiratory distress.  Abdominal: He exhibits no distension.  Musculoskeletal: He exhibits no edema.  Neurological: He is alert and oriented to person, place, and time.  Skin: Skin is warm and  dry.  Psychiatric: His mood appears anxious. His affect is inappropriate. He is agitated, aggressive, hyperactive and combative. Thought content is delusional. He expresses impulsivity.  Nursing note and vitals reviewed.   ED Course  Procedures (including critical care time) Labs Review I reviewed the lab results.  Soon after my initial evaluation the patient became verbally aggressive, yelling, threatening staff members. Patient was verbally calmed down, but did receive oral Ativan.   MDM   Patient presents with behavior consistent with a manic episode. Patient denies suicidal ideation, homicidal ideation, but does seem to be interacting with external stimuli. Patient does calmed verbally, but is labile. Patient was medically cleared for further assistance for psychiatric treatment by our colleagues.    Carmin Muskrat, MD 04/01/15 [phone]

## 2015-04-01 LAB — CBG MONITORING, ED
GLUCOSE-CAPILLARY: 406 mg/dL — AB (ref 70–99)
Glucose-Capillary: 362 mg/dL — ABNORMAL HIGH (ref 70–99)

## 2015-04-01 LAB — RAPID URINE DRUG SCREEN, HOSP PERFORMED
AMPHETAMINES: NOT DETECTED
BARBITURATES: NOT DETECTED
Benzodiazepines: NOT DETECTED
Cocaine: NOT DETECTED
Opiates: NOT DETECTED
Tetrahydrocannabinol: NOT DETECTED

## 2015-04-01 MED ORDER — INSULIN ASPART 100 UNIT/ML ~~LOC~~ SOLN
8.0000 [IU] | Freq: Once | SUBCUTANEOUS | Status: DC
  Filled 2015-04-01: qty 1

## 2015-04-01 MED ORDER — INSULIN ASPART 100 UNIT/ML ~~LOC~~ SOLN: 0.0000 [IU] | Freq: Three times a day (TID) | SUBCUTANEOUS | Status: DC

## 2015-04-01 NOTE — Progress Notes (Signed)
TTS Counselor faxed out supporting documentation to the the following facilities for placement: [facility]

## 2015-04-01 NOTE — Consult Note (Signed)
[facility] Consult   Reason for Consult:  Delusion, Psychosis Referring Physician:  EDP Patient Identification: [name] MRN:  [id number] Principal Diagnosis: Schizoaffective disorder, unspecified type Diagnosis:   Patient Active Problem List   Diagnosis Date Noted  . Hip pain [M25.559] 03/29/2015  . Schizoaffective disorder, unspecified type [F25.9] 03/29/2015  . Generalized anxiety disorder [F41.1] 03/29/2015  . Undifferentiated schizophrenia [F20.3]   . Schizophrenia [F20.9] 03/20/2015  . HCAP (healthcare-associated pneumonia) [J18.9] 01/23/2015  . Hyperglycemia [R73.9] 12/21/2014  . DKA (diabetic ketoacidoses) [E13.10] 11/18/2014  . Hip pain, bilateral [M25.551, M25.552] 11/18/2014  . DKA, type 1 [E10.10] 06/17/2012  . Hyponatremia [E87.1] 06/17/2012  . Dehydration [E86.0] 06/17/2012  . Diabetes type 1, uncontrolled [E10.65] 02/19/2012    Total Time spent with patient: 45 minutes  Subjective:   [name] is a 29 y.o. male patient admitted with Delusion, Anxiety, hx of Schizophrenia.  HPI:  Caucasian male, 29 years old was voluntarily brought in by [facility] for responding to internal stimuli.  Patient has a hx of Schizophrenia and was discharged from our inpatient Psychiatric unit on the 8th of this month.   Patient have not been seen for his follow up and stated that he has not taken any of his medications since he left.  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  Patient stated this morning that he was not going to take any medication besides Adderall.  Patient was informed that 80 mg of Adderall keeps him aginated and will not be prescribed.  Patient then requested to be discharged because he came in voluntarily.  Patient was discharged home.  He denied SI/HI/AVH.  HPI Elements:   Location:  Schizophrenia, Delusional disorder, . Severity:  severe-moderate. Timing:  acute. Duration:  Chronic  mental illness. Context:  seeking treatment for delusional thought.  Past Medical History:  Past Medical History  Diagnosis Date  . Diabetes mellitus   . Depression   . Schizo affective schizophrenia   . Polysubstance abuse   . Scoliosis   . Chronic pain   . Noncompliance with medication regimen   . DKA (diabetic ketoacidoses) 11/18/2014  . GERD (gastroesophageal reflux disease)   . Arthritis     "both hips" (12/21/2014)   History reviewed. No pertinent past surgical history. Family History:  Family History  Problem Relation Age of Onset  . Diabetes Mother    Social History:  History  Alcohol Use  . Yes    Comment: 12/21/2014 "I drink on a rare occasion"     History  Drug Use  . Yes  . Special: Marijuana    Comment: 12/21/2014 "I use marijuana on a rare occasion", last used marijuana 01/30/2015    History   Social History  . Marital Status: Married    Spouse Name: N/A  . Number of Children: N/A  . Years of Education: N/A   Social History Main Topics  . Smoking status: Current Some Day Smoker -- 1.00 packs/day for 7 years    Types: Cigarettes  . Smokeless tobacco: Never Used  . Alcohol Use: Yes     Comment: 12/21/2014 "I drink on a rare occasion"  . Drug Use: Yes    Special: Marijuana     Comment: 12/21/2014 "I use marijuana on a rare occasion", last used marijuana 01/30/2015  . Sexual Activity: Yes   Other Topics Concern  . None   Social History Narrative   **  Merged History Encounter **       Additional Social History:    Pain Medications: See MAR  Prescriptions: See MAR  Over the Counter: See MAR  History of alcohol / drug use?: Yes Longest period of sobriety (when/how long): None  Negative Consequences of Use: Work / Youth worker, Charity fundraiser relationships Withdrawal Symptoms: Other (Comment) (No w/d sxs ) Name of Substance 1: Alcohol  1 - Age of First Use: Teens  1 - Amount (size/oz): 2--[employer] [place]  1 - Frequency: 3x's Wkly  1 - Duration: On-going  1 - Last  Use / Amount: 2 Days Ago                    Allergies:   Allergies  Allergen Reactions  . Sulfa Antibiotics Other (See Comments)    Unknown - Childhood    Labs:  Results for orders placed or performed during the hospital encounter of 03/31/15 (from the past 48 hour(s))  Acetaminophen level     Status: Abnormal   Collection Time: 03/31/15  6:48 PM  Result Value Ref Range   Acetaminophen (Tylenol), Serum <10.0 (L) 10 - 30 ug/mL    Comment:        THERAPEUTIC CONCENTRATIONS VARY SIGNIFICANTLY. A RANGE OF 10-30 ug/mL MAY BE AN EFFECTIVE CONCENTRATION FOR MANY PATIENTS. HOWEVER, SOME ARE BEST TREATED AT CONCENTRATIONS OUTSIDE THIS RANGE. ACETAMINOPHEN CONCENTRATIONS >150 ug/mL AT 4 HOURS AFTER INGESTION AND >50 ug/mL AT 12 HOURS AFTER INGESTION ARE OFTEN ASSOCIATED WITH TOXIC REACTIONS.   CBC     Status: Abnormal   Collection Time: 03/31/15  6:48 PM  Result Value Ref Range   WBC 8.3 4.0 - 10.5 K/uL   RBC 4.26 4.22 - 5.81 MIL/uL   Hemoglobin 13.0 13.0 - 17.0 g/dL   HCT 37.8 (L) 39.0 - 52.0 %   MCV 88.7 78.0 - 100.0 fL   MCH 30.5 26.0 - 34.0 pg   MCHC 34.4 30.0 - 36.0 g/dL   RDW 13.7 11.5 - 15.5 %   Platelets 267 150 - 400 K/uL  Comprehensive metabolic panel     Status: Abnormal   Collection Time: 03/31/15  6:48 PM  Result Value Ref Range   Sodium 143 135 - 145 mmol/L   Potassium 3.4 (L) 3.5 - 5.1 mmol/L   Chloride 103 96 - 112 mmol/L   CO2 32 19 - 32 mmol/L   Glucose, Bld 75 70 - 99 mg/dL   BUN 18 6 - 23 mg/dL   Creatinine, Ser 0.57 0.50 - 1.35 mg/dL   Calcium 9.3 8.4 - 10.5 mg/dL   Total Protein 6.5 6.0 - 8.3 g/dL   Albumin 4.2 3.5 - 5.2 g/dL   AST 25 0 - 37 U/L   ALT 24 0 - 53 U/L   Alkaline Phosphatase 55 39 - 117 U/L   Total Bilirubin 0.4 0.3 - 1.2 mg/dL   GFR calc non Af Amer >90 >90 mL/min   GFR calc Af Amer >90 >90 mL/min    Comment: (NOTE) The eGFR has been calculated using the CKD EPI equation. This calculation has not been validated in all  clinical situations. eGFR's persistently <90 mL/min signify possible Chronic Kidney Disease.    Anion gap 8 5 - 15  Ethanol (ETOH)     Status: None   Collection Time: 03/31/15  6:48 PM  Result Value Ref Range   Alcohol, Ethyl (B) <5 0 - 9 mg/dL    Comment:  LOWEST DETECTABLE LIMIT FOR SERUM ALCOHOL IS 11 mg/dL FOR MEDICAL PURPOSES ONLY   Salicylate level     Status: None   Collection Time: 03/31/15  6:48 PM  Result Value Ref Range   Salicylate Lvl <6.7 2.8 - 20.0 mg/dL  Urine Drug Screen     Status: None   Collection Time: 04/01/15  4:42 AM  Result Value Ref Range   Opiates NONE DETECTED NONE DETECTED   Cocaine NONE DETECTED NONE DETECTED   Benzodiazepines NONE DETECTED NONE DETECTED   Amphetamines NONE DETECTED NONE DETECTED   Tetrahydrocannabinol NONE DETECTED NONE DETECTED   Barbiturates NONE DETECTED NONE DETECTED    Comment:        DRUG SCREEN FOR MEDICAL PURPOSES ONLY.  IF CONFIRMATION IS NEEDED FOR ANY PURPOSE, NOTIFY LAB WITHIN 5 DAYS.        LOWEST DETECTABLE LIMITS FOR URINE DRUG SCREEN Drug Class       Cutoff (ng/mL) Amphetamine      1000 Barbiturate      200 Benzodiazepine   544 Tricyclics       920 Opiates          300 Cocaine          300 THC              50   CBG monitoring, ED     Status: Abnormal   Collection Time: 04/01/15  7:02 AM  Result Value Ref Range   Glucose-Capillary 406 (H) 70 - 99 mg/dL   Comment 1 Notify RN    Comment 2 Document in Chart   CBG monitoring, ED     Status: Abnormal   Collection Time: 04/01/15  9:10 AM  Result Value Ref Range   Glucose-Capillary 362 (H) 70 - 99 mg/dL    Vitals: Blood pressure 120/79, pulse 106, temperature 97.7 F (36.5 C), temperature source Oral, resp. rate 18, SpO2 100 %.  Risk to Self: Suicidal Ideation: No Suicidal Intent: No Is patient at risk for suicide?: No Suicidal Plan?: No Access to Means: No Specify Access to Suicidal Means: None  What has been your use of drugs/alcohol  within the last 12 months?: Pt using alcohol to help him calm down  How many times?: 10 Other Self Harm Risks: None  Triggers for Past Attempts: Unpredictable Intentional Self Injurious Behavior: None Risk to Others: Homicidal Ideation: No Thoughts of Harm to Others: No Current Homicidal Intent: No Current Homicidal Plan: No Access to Homicidal Means: No Identified Victim: None  History of harm to others?: No Assessment of Violence: None Noted Violent Behavior Description: None  Does patient have access to weapons?: No Criminal Charges Pending?: No Does patient have a court date: No Prior Inpatient Therapy: Prior Inpatient Therapy: Yes Prior Therapy Dates: 2013, 2016 Prior Therapy Facilty/Provider(s): [facility]  Reason for Treatment: Schizophrenia  Prior Outpatient Therapy: Prior Outpatient Therapy: No Prior Therapy Dates: None  Prior Therapy Facilty/Provider(s): None  Reason for Treatment: None   No current facility-administered medications for this encounter.   Current Outpatient Prescriptions  Medication Sig Dispense Refill  . amphetamine-dextroamphetamine (ADDERALL) 20 MG tablet Take 40 mg by mouth 2 (two) times daily.    [name] gabapentin (NEURONTIN) 300 MG capsule Take 1 capsule (300 mg total) by mouth 3 (three) times daily at 8am, 3pm and bedtime. For agitation 90 capsule 3  . insulin aspart protamine- aspart (NOVOLOG MIX 70/30) (70-30) 100 UNIT/ML injection Inject 0.4 mLs (40 Units total) into the skin 2 (two) times  daily with a meal. For diabetes management 30 mL 3  . risperiDONE (RISPERDAL M-TABS) 2 MG disintegrating tablet Take 1 tablet (2 mg) daily & 2 tablets (4 mg) at bedtime: For mood control 90 tablet 3  . traZODone (DESYREL) 100 MG tablet Take 2 tablets (200 mg total) by mouth at bedtime. For sleep (Patient taking differently: Take 300 mg by mouth at bedtime. For sleep) 60 tablet 3  . benztropine (COGENTIN) 0.5 MG tablet Take 1 tablet (0.5 mg total) by mouth 2 (two) times  daily. For prevention of drug induced tremors (Patient not taking: Reported on 03/31/2015) 60 tablet 3  . divalproex (DEPAKOTE) 500 MG DR tablet Take 1 Tablet (500 mg) daily & 1.5 tablets (750 mg) at bedtime: For mood stabilization 75 tablet 3  . glucose blood (FREESTYLE LITE) test strip Use as instructed 100 each 0  . glucose monitoring kit (FREESTYLE) monitoring kit 1 each by Does not apply route as needed for other. Test blood sugars three daily and when needed: For blood sugar checks 1 each 1  . hydrOXYzine (ATARAX/VISTARIL) 25 MG tablet Take 1 tablet (25 mg total) by mouth 3 (three) times daily as needed for anxiety. (Patient not taking: Reported on 03/31/2015) 60 tablet 3  . insulin aspart (NOVOLOG) 100 UNIT/ML injection Inject 0-9 Units into the skin 3 (three) times daily with meals. CBG < 70: Eat or drink something sweet and recheck. CBG 70 - 120: 0 units CBG 121 - 150: 1 unit CBG 151 - 200: 2 units: Blood sugar sliding scale insulin coverage. CBG 201 - 250: 3 units CBG 251 - 300: 5 units CBG 301 - 350: 7 units CBG 351 - 400: 9 units CBG > 400: call MD. 10 mL 11  . INSULIN SYRINGE .5CC/29G 29G X 1/2" 0.5 ML MISC 1 vial by Does not apply route 2 (two) times daily. For diabetes management 200 each 3  . Lancets (FREESTYLE) lancets Use as instructed: For blood sugar checks 100 each 3  . [DISCONTINUED] citalopram (CELEXA) 20 MG tablet Take 20 mg by mouth daily.    . [DISCONTINUED] FLUoxetine (PROZAC) 20 MG tablet Take 20 mg by mouth every morning.    . [DISCONTINUED] insulin glargine (LANTUS) 100 UNIT/ML injection Inject 30 Units into the skin at bedtime. 10 mL 3  . [DISCONTINUED] insulin lispro (HUMALOG) 100 UNIT/ML injection Inject 10 Units into the skin 3 (three) times daily before meals.      . [DISCONTINUED] OLANZapine zydis (ZYPREXA) 10 MG disintegrating tablet Take 10 mg by mouth 2 (two) times daily.    . [DISCONTINUED] sertraline (ZOLOFT) 50 MG tablet Take 50 mg by mouth daily. For  depression. Just started med, have not picked up yet [facility] [address]      Musculoskeletal: Strength & Muscle Tone: within normal limits Gait & Station: normal Patient leans: N/A  Psychiatric Specialty Exam:     Blood pressure 120/79, pulse 106, temperature 97.7 F (36.5 C), temperature source Oral, resp. rate 18, SpO2 100 %.There is no weight on file to calculate BMI.  General Appearance: Casual and Disheveled  Eye Contact::  Good  Speech:  Clear and Coherent and Normal Rate  Volume:  Normal  Mood:  Angry  Affect:  Congruent and Depressed  Thought Process:  Coherent, Goal Directed and Intact  Orientation:  Full (Time, Place, and Person)  Thought Content:  Delusions  Suicidal Thoughts:  No  Homicidal Thoughts:  No  Memory:  Immediate;   Good Recent;  Good Remote;   Good  Judgement:  Poor  Insight:  Shallow  Psychomotor Activity:  Normal  Concentration:  Fair  Recall:  NA  Fund of Knowledge:Fair  Language: Good  Akathisia:  NA  Handed:  Right  AIMS (if indicated):     Assets:  Desire for Improvement  ADL's:  Impaired  Cognition: WNL  Sleep:      Medical Decision Making: Established Problem, Stable/Improving (1)  Treatment Plan Summary: discharged home.  Plan:  discharge home Disposition: Discharge home  Delfin Gant   PMHNP-BC 04/01/2015 3:57 PM Patient seen face-to-face for psychiatric evaluation, chart reviewed and case discussed with the physician extender and developed treatment plan. Reviewed the information documented and agree with the treatment plan. Corena Pilgrim, MD

## 2015-04-01 NOTE — ED Notes (Signed)
Patient discharged to home.  Instructed to follow up with outpatient provider.  Patient left the unit ambulatory with all belongings.  Denies suicidal thoughts or thoughts of hurting others.

## 2015-04-01 NOTE — ED Notes (Signed)
[employer] officers dealing with patient in treatment area 39 and patient runs up behind officers in a threatening manner. Patient was asked to return to his room patient and he became belligerent. Patient had to be escorted to his room by [facility] staff. Donell Sievert contacted and new orders received. Encouragment and support provided and safety maintain. Q 15 min safety checks remain.

## 2015-04-01 NOTE — [facility] Suicide Risk Assessment (Cosign Needed)
Suicide Risk Assessment  Discharge Assessment   [facility] Discharge Suicide Risk Assessment   Demographic Factors:  Male, Adolescent or young adult, Caucasian, Low socioeconomic status, Living alone and Unemployed  Total Time spent with patient: 20 minutes  Musculoskeletal: Strength & Muscle Tone: within normal limits Gait & Station: normal Patient leans: N/A  Psychiatric Specialty Exam:     Blood pressure 114/75, pulse 112, temperature 98.9 F (37.2 C), temperature source Oral, resp. rate 21, SpO2 98 %.There is no weight on file to calculate BMI.  General Appearance: Casual and Disheveled  Eye Contact::  Good  Speech:  Clear and Coherent and Normal Rate409  Volume:  Normal  Mood:  Angry  Affect:  Congruent  Thought Process:  Coherent, Goal Directed and Intact  Orientation:  Full (Time, Place, and Person)  Thought Content:  WDL  Suicidal Thoughts:  No  Homicidal Thoughts:  No  Memory:  Immediate;   Good Recent;   Good Remote;   Good  Judgement:  Poor  Insight:  Shallow  Psychomotor Activity:  Normal  Concentration:  Fair  Recall:  NA  Fund of Knowledge:Fair  Language: Good  Akathisia:  NA  Handed:  Right  AIMS (if indicated):     Assets:  Desire for Improvement  Sleep:     Cognition: WNL  ADL's:  Intact      Has this patient used any form of tobacco in the last 30 days? (Cigarettes, Smokeless Tobacco, Cigars, and/or Pipes) Yes, A prescription for an FDA-approved tobacco cessation medication was offered at discharge and the patient refused  Mental Status Per Nursing Assessment::   On Admission:     Current Mental Status by Physician: NA  Loss Factors: NA  Historical Factors: Prior suicide attempts  Risk Reduction Factors:   Religious beliefs about death and Positive coping skills or problem solving skills  Continued Clinical Symptoms:  Bipolar Disorder:   Depressive phase Depression:   Insomnia  Cognitive Features That Contribute To Risk:  Polarized  thinking    Suicide Risk:  Minimal: No identifiable suicidal ideation.  Patients presenting with no risk factors but with morbid ruminations; may be classified as minimal risk based on the severity of the depressive symptoms  Principal Problem: <principal problem not specified> Discharge Diagnoses:  Patient Active Problem List   Diagnosis Date Noted  . Hip pain [M25.559] 03/29/2015  . Schizoaffective disorder, unspecified type [F25.9] 03/29/2015  . Generalized anxiety disorder [F41.1] 03/29/2015  . Undifferentiated schizophrenia [F20.3]   . Schizophrenia [F20.9] 03/20/2015  . HCAP (healthcare-associated pneumonia) [J18.9] 01/23/2015  . Hyperglycemia [R73.9] 12/21/2014  . DKA (diabetic ketoacidoses) [E13.10] 11/18/2014  . Hip pain, bilateral [M25.551, M25.552] 11/18/2014  . DKA, type 1 [E10.10] 06/17/2012  . Hyponatremia [E87.1] 06/17/2012  . Dehydration [E86.0] 06/17/2012  . Diabetes type 1, uncontrolled [E10.65] 02/19/2012      Plan Of Care/Follow-up recommendations:  Activity:  AS TOLERATED Diet:  REGULAR  Is patient on multiple antipsychotic therapies at discharge:  No   Has Patient had three or more failed trials of antipsychotic monotherapy by history:  No  Recommended Plan for Multiple Antipsychotic Therapies: NA    [name] C  PMHNP-BC 04/01/2015, 10:29 AM

## 2015-04-01 NOTE — ED Notes (Signed)
Patient complains of increase anxiety. Respirations equal and unlabored, skin warm and dry. NAD. Patient will be medicated per MAR.  Encouragement and support provided and safety maintain. Q 15 min safety checks remain in place.

## 2015-04-01 NOTE — ED Notes (Signed)
Patient refused to take the additional 8 units of insulin for CBG of 362.  States he will only take medications that he takes at home.  Patient was informed of his high blood sugar and he stated that he did not care.

## 2015-04-01 NOTE — ED Notes (Signed)
Gatorade given to patient.

## 2015-04-18 ENCOUNTER — Emergency Department (HOSPITAL_COMMUNITY)
Admission: EM | Admit: 2015-04-18 | Discharge: 2015-04-19 | Disposition: A | Discharge status: Home / Self Care | Payer: PPO | Attending: Emergency Medicine | Admitting: Emergency Medicine

## 2015-04-18 ENCOUNTER — Encounter (HOSPITAL_COMMUNITY): Payer: Self-pay | Admitting: *Deleted

## 2015-04-18 DIAGNOSIS — G8929 Other chronic pain: Secondary | ICD-10-CM | POA: Diagnosis not present

## 2015-04-18 DIAGNOSIS — Z720 Tobacco use: Secondary | ICD-10-CM | POA: Insufficient documentation

## 2015-04-18 DIAGNOSIS — E1310 Other specified diabetes mellitus with ketoacidosis without coma: Secondary | ICD-10-CM | POA: Diagnosis not present

## 2015-04-18 DIAGNOSIS — Z8719 Personal history of other diseases of the digestive system: Secondary | ICD-10-CM | POA: Insufficient documentation

## 2015-04-18 DIAGNOSIS — F329 Major depressive disorder, single episode, unspecified: Secondary | ICD-10-CM | POA: Diagnosis not present

## 2015-04-18 DIAGNOSIS — Z9114 Patient's other noncompliance with medication regimen: Secondary | ICD-10-CM | POA: Insufficient documentation

## 2015-04-18 DIAGNOSIS — F411 Generalized anxiety disorder: Secondary | ICD-10-CM

## 2015-04-18 DIAGNOSIS — F259 Schizoaffective disorder, unspecified: Secondary | ICD-10-CM

## 2015-04-18 DIAGNOSIS — F1210 Cannabis abuse, uncomplicated: Secondary | ICD-10-CM | POA: Insufficient documentation

## 2015-04-18 DIAGNOSIS — F32A Depression, unspecified: Secondary | ICD-10-CM | POA: Diagnosis present

## 2015-04-18 DIAGNOSIS — F10129 Alcohol abuse with intoxication, unspecified: Secondary | ICD-10-CM | POA: Insufficient documentation

## 2015-04-18 DIAGNOSIS — Z79899 Other long term (current) drug therapy: Secondary | ICD-10-CM | POA: Diagnosis not present

## 2015-04-18 DIAGNOSIS — R45851 Suicidal ideations: Secondary | ICD-10-CM | POA: Diagnosis not present

## 2015-04-18 DIAGNOSIS — Z8739 Personal history of other diseases of the musculoskeletal system and connective tissue: Secondary | ICD-10-CM | POA: Insufficient documentation

## 2015-04-18 DIAGNOSIS — Z794 Long term (current) use of insulin: Secondary | ICD-10-CM | POA: Insufficient documentation

## 2015-04-18 DIAGNOSIS — F309 Manic episode, unspecified: Secondary | ICD-10-CM | POA: Diagnosis present

## 2015-04-18 LAB — COMPREHENSIVE METABOLIC PANEL WITH GFR
ALT: 19 U/L (ref 17–63)
AST: 20 U/L (ref 15–41)
Albumin: 4.5 g/dL (ref 3.5–5.0)
Alkaline Phosphatase: 70 U/L (ref 38–126)
Anion gap: 12 (ref 5–15)
BUN: 11 mg/dL (ref 6–20)
CO2: 25 mmol/L (ref 22–32)
Calcium: 8.8 mg/dL — ABNORMAL LOW (ref 8.9–10.3)
Chloride: 106 mmol/L (ref 101–111)
Creatinine, Ser: 0.55 mg/dL — ABNORMAL LOW (ref 0.61–1.24)
GFR calc Af Amer: >60 mL/min
GFR calc non Af Amer: >60 mL/min
Glucose, Bld: 283 mg/dL — ABNORMAL HIGH (ref 70–99)
Potassium: 3.8 mmol/L (ref 3.5–5.1)
Sodium: 143 mmol/L (ref 135–145)
Total Bilirubin: 0.6 mg/dL (ref 0.3–1.2)
Total Protein: 7 g/dL (ref 6.5–8.1)

## 2015-04-18 LAB — RAPID URINE DRUG SCREEN, HOSP PERFORMED
Amphetamines: NOT DETECTED
Barbiturates: NOT DETECTED
Benzodiazepines: NOT DETECTED
Cocaine: NOT DETECTED
Opiates: NOT DETECTED
Tetrahydrocannabinol: POSITIVE — AB

## 2015-04-18 LAB — CBC
HCT: 45.3 % (ref 39.0–52.0)
HEMOGLOBIN: 15.2 g/dL (ref 13.0–17.0)
MCH: 29.7 pg (ref 26.0–34.0)
MCHC: 33.6 g/dL (ref 30.0–36.0)
MCV: 88.6 fL (ref 78.0–100.0)
PLATELETS: 305 10*3/uL (ref 150–400)
RBC: 5.11 MIL/uL (ref 4.22–5.81)
RDW: 14.1 % (ref 11.5–15.5)
WBC: 6.4 10*3/uL (ref 4.0–10.5)

## 2015-04-18 LAB — CBG MONITORING, ED
GLUCOSE-CAPILLARY: 310 mg/dL — AB (ref 70–99)
Glucose-Capillary: 281 mg/dL — ABNORMAL HIGH (ref 70–99)

## 2015-04-18 LAB — ACETAMINOPHEN LEVEL

## 2015-04-18 LAB — SALICYLATE LEVEL: Salicylate Lvl: <4 mg/dL (ref 2.8–30.0)

## 2015-04-18 LAB — ETHANOL: ALCOHOL ETHYL (B): 268 mg/dL — AB (ref <5)

## 2015-04-18 MED ORDER — TRAZODONE HCL 100 MG PO TABS
200.0000 mg | ORAL_TABLET | Freq: Every day | ORAL | Status: DC
  Administered 2015-04-19: 200 mg via ORAL
  Filled 2015-04-18: qty 2

## 2015-04-18 MED ORDER — INSULIN ASPART 100 UNIT/ML ~~LOC~~ SOLN
0.0000 [IU] | Freq: Three times a day (TID) | SUBCUTANEOUS | Status: DC
  Administered 2015-04-18: 9 [IU] via SUBCUTANEOUS
  Administered 2015-04-19: 7 [IU] via SUBCUTANEOUS
  Filled 2015-04-18 (×2): qty 1

## 2015-04-18 MED ORDER — LORAZEPAM 2 MG/ML IJ SOLN: 0.0000 mg | Freq: Four times a day (QID) | INTRAMUSCULAR | Status: DC

## 2015-04-18 MED ORDER — ZOLPIDEM TARTRATE 5 MG PO TABS
5.0000 mg | ORAL_TABLET | Freq: Every evening | ORAL | Status: DC | PRN
  Administered 2015-04-18: 5 mg via ORAL
  Filled 2015-04-18: qty 1

## 2015-04-18 MED ORDER — "INSULIN SYRINGE 29G X 1/2"" 0.5 ML MISC": 1.0000 | Freq: Two times a day (BID) | Status: DC

## 2015-04-18 MED ORDER — LORAZEPAM 1 MG PO TABS: 0.0000 mg | ORAL_TABLET | Freq: Two times a day (BID) | ORAL | Status: DC

## 2015-04-18 MED ORDER — ALUM & MAG HYDROXIDE-SIMETH 200-200-20 MG/5ML PO SUSP: 30.0000 mL | ORAL | Status: DC | PRN

## 2015-04-18 MED ORDER — INSULIN ASPART PROT & ASPART (70-30 MIX) 100 UNIT/ML ~~LOC~~ SUSP
40.0000 [IU] | Freq: Two times a day (BID) | SUBCUTANEOUS | Status: DC
  Administered 2015-04-19: 40 [IU] via SUBCUTANEOUS
  Filled 2015-04-18: qty 10

## 2015-04-18 MED ORDER — LORAZEPAM 2 MG/ML IJ SOLN: 0.0000 mg | Freq: Two times a day (BID) | INTRAMUSCULAR | Status: DC

## 2015-04-18 MED ORDER — LORAZEPAM 2 MG/ML IJ SOLN
0.0000 mg | Freq: Two times a day (BID) | INTRAMUSCULAR | Status: DC
  Filled 2015-04-18: qty 1

## 2015-04-18 MED ORDER — ACETAMINOPHEN 325 MG PO TABS: 650.0000 mg | ORAL_TABLET | ORAL | Status: DC | PRN

## 2015-04-18 MED ORDER — RISPERIDONE 2 MG PO TBDP
4.0000 mg | ORAL_TABLET | Freq: Every day | ORAL | Status: DC
  Administered 2015-04-18: 4 mg via ORAL
  Filled 2015-04-18 (×2): qty 2

## 2015-04-18 MED ORDER — HYDROXYZINE HCL 25 MG PO TABS
25.0000 mg | ORAL_TABLET | Freq: Three times a day (TID) | ORAL | Status: DC | PRN
  Administered 2015-04-18: 25 mg via ORAL
  Filled 2015-04-18: qty 1

## 2015-04-18 MED ORDER — LORAZEPAM 1 MG PO TABS
1.0000 mg | ORAL_TABLET | Freq: Three times a day (TID) | ORAL | Status: DC | PRN
  Administered 2015-04-18: 1 mg via ORAL
  Filled 2015-04-18: qty 1

## 2015-04-18 MED ORDER — AMPHETAMINE-DEXTROAMPHETAMINE 20 MG PO TABS: 40.0000 mg | ORAL_TABLET | Freq: Two times a day (BID) | ORAL | Status: DC

## 2015-04-18 MED ORDER — ONDANSETRON HCL 4 MG PO TABS: 4.0000 mg | ORAL_TABLET | Freq: Three times a day (TID) | ORAL | Status: DC | PRN

## 2015-04-18 MED ORDER — NICOTINE 21 MG/24HR TD PT24: 21.0000 mg | MEDICATED_PATCH | Freq: Every day | TRANSDERMAL | Status: DC

## 2015-04-18 MED ORDER — IBUPROFEN 200 MG PO TABS
600.0000 mg | ORAL_TABLET | Freq: Three times a day (TID) | ORAL | Status: DC | PRN
  Administered 2015-04-18: 600 mg via ORAL
  Filled 2015-04-18: qty 3

## 2015-04-18 MED ORDER — LORAZEPAM 1 MG PO TABS: 0.0000 mg | ORAL_TABLET | Freq: Four times a day (QID) | ORAL | Status: DC

## 2015-04-18 MED ORDER — VITAMIN B-1 100 MG PO TABS
100.0000 mg | ORAL_TABLET | Freq: Every day | ORAL | Status: DC
Start: 2015-04-19 — End: 2015-04-19
  Administered 2015-04-19: 100 mg via ORAL
  Filled 2015-04-18: qty 1

## 2015-04-18 MED ORDER — LORAZEPAM 2 MG/ML IJ SOLN
0.0000 mg | Freq: Four times a day (QID) | INTRAMUSCULAR | Status: DC
  Administered 2015-04-18: 2 mg via INTRAMUSCULAR

## 2015-04-18 MED ORDER — BENZTROPINE MESYLATE 1 MG PO TABS
0.5000 mg | ORAL_TABLET | Freq: Two times a day (BID) | ORAL | Status: DC
  Administered 2015-04-18 – 2015-04-19 (×2): 0.5 mg via ORAL
  Filled 2015-04-18 (×2): qty 1

## 2015-04-18 MED ORDER — GABAPENTIN 300 MG PO CAPS
300.0000 mg | ORAL_CAPSULE | ORAL | Status: DC
  Administered 2015-04-18 – 2015-04-19 (×2): 300 mg via ORAL
  Filled 2015-04-18 (×2): qty 1

## 2015-04-18 MED ORDER — THIAMINE HCL 100 MG/ML IJ SOLN: 100.0000 mg | Freq: Every day | INTRAMUSCULAR | Status: DC

## 2015-04-18 MED ORDER — RISPERIDONE 2 MG PO TBDP
2.0000 mg | ORAL_TABLET | Freq: Every day | ORAL | Status: DC
  Administered 2015-04-19: 2 mg via ORAL
  Filled 2015-04-18: qty 1

## 2015-04-18 MED ORDER — NICOTINE POLACRILEX 2 MG MT GUM
2.0000 mg | CHEWING_GUM | OROMUCOSAL | Status: DC | PRN
  Administered 2015-04-18 – 2015-04-19 (×2): 2 mg via ORAL
  Filled 2015-04-18: qty 1

## 2015-04-18 NOTE — BH Assessment (Signed)
No appropriate beds at [facility]. Pt has been referred to the following facilities: [facility], [facility] and [facility].

## 2015-04-18 NOTE — ED Provider Notes (Signed)
Patient reports he feels depressed with thoughts of suicide presently. He is been out of his medications. Presently he is alert cooperative Glasgow Coma Score 15. Results for orders placed or performed during the hospital encounter of 04/18/15  Acetaminophen level  Result Value Ref Range   Acetaminophen (Tylenol), Serum <10 (L) 10 - 30 ug/mL  CBC  Result Value Ref Range   WBC 6.4 4.0 - 10.5 K/uL   RBC 5.11 4.22 - 5.81 MIL/uL   Hemoglobin 15.2 13.0 - 17.0 g/dL   HCT 54.6 27.0 - 35.0 %   MCV 88.6 78.0 - 100.0 fL   MCH 29.7 26.0 - 34.0 pg   MCHC 33.6 30.0 - 36.0 g/dL   RDW 09.3 81.8 - 29.9 %   Platelets 305 150 - 400 K/uL  Comprehensive metabolic panel  Result Value Ref Range   Sodium 143 135 - 145 mmol/L   Potassium 3.8 3.5 - 5.1 mmol/L   Chloride 106 101 - 111 mmol/L   CO2 25 22 - 32 mmol/L   Glucose, Bld 283 (H) 70 - 99 mg/dL   BUN 11 6 - 20 mg/dL   Creatinine, Ser 3.71 (L) 0.61 - 1.24 mg/dL   Calcium 8.8 (L) 8.9 - 10.3 mg/dL   Total Protein 7.0 6.5 - 8.1 g/dL   Albumin 4.5 3.5 - 5.0 g/dL   AST 20 15 - 41 U/L   ALT 19 17 - 63 U/L   Alkaline Phosphatase 70 38 - 126 U/L   Total Bilirubin 0.6 0.3 - 1.2 mg/dL   GFR calc non Af Amer >60 >60 mL/min   GFR calc Af Amer >60 >60 mL/min   Anion gap 12 5 - 15  Ethanol (ETOH)  Result Value Ref Range   Alcohol, Ethyl (B) 268 (H) <5 mg/dL  Salicylate level  Result Value Ref Range   Salicylate Lvl <4.0 2.8 - 30.0 mg/dL  Urine Drug Screen  Result Value Ref Range   Opiates NONE DETECTED NONE DETECTED   Cocaine NONE DETECTED NONE DETECTED   Benzodiazepines NONE DETECTED NONE DETECTED   Amphetamines NONE DETECTED NONE DETECTED   Tetrahydrocannabinol POSITIVE (A) NONE DETECTED   Barbiturates NONE DETECTED NONE DETECTED  CBG monitoring, ED  Result Value Ref Range   Glucose-Capillary 281 (H) 70 - 99 mg/dL   Comment 1 Notify RN   CBG monitoring, ED  Result Value Ref Range   Glucose-Capillary 310 (H) 70 - 99 mg/dL   No results  found.   Doug Sou, MD 04/18/15 2256

## 2015-04-18 NOTE — ED Notes (Signed)
PA at bedside.

## 2015-04-18 NOTE — ED Notes (Signed)
Pt eating meal tray 

## 2015-04-18 NOTE — BH Assessment (Signed)
Tele Assessment Note   [name] is an 29 y.o. male presenting to [facility] reporting depression. PT stated "I am depress but I made a girlfriend today". 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 "I get to play like this". "It's fun". "I'm playing like a retard". "All I need is a girlfriend and I won't cry anymore". Pt denies SI, HI and AVH at this time; however when this writer left pt's room pt began talking to himself. Pt reported that he has attempted suicide in the past but stated "I don't want to talk about that it's in the past".Pt reported that he drunk a 1/2 bottle of aristocrat vodka prior to coming to the ED. Pt did not report any illicit substance abuse; however his UDS is positive for marijuana. Pt is alert and oriented x3. Pt is cooperative at this time. Pt maintained good eye contact throughout this assessment. Pt speech is loud. Pt appears manic and responding to internal stimuli. Pt stated repeatedly throughout this assessment that he had a girlfriend. PT reported that he has not been compliant with his medication; however he was unable to recall the last time he took his medication.   Axis I: Schizoaffective Disorder  Past Medical History:  Past Medical History  Diagnosis Date  . Diabetes mellitus   . Depression   . Schizo affective schizophrenia   . Polysubstance abuse   . Scoliosis   . Chronic pain   . Noncompliance with medication regimen   . DKA (diabetic ketoacidoses) 11/18/2014  . GERD (gastroesophageal reflux disease)   . Arthritis     "both hips" (12/21/2014)    History reviewed. No pertinent past surgical history.  Family History:  Family History  Problem Relation Age of Onset  . Diabetes Mother     Social History:  reports that he has been smoking Cigarettes.  He has a 7 pack-year smoking history. He has never used smokeless tobacco. He reports that he drinks alcohol. He reports that he uses illicit drugs (Marijuana).  Additional  Social History:  Substance #1 Name of Substance 1: Alcohol  1 - Age of First Use: 10 1 - Amount (size/oz): "1/2 bottle" 1 - Frequency: 3x's Wkly "everytime I can"  1 - Duration: On-going  1 - Last Use / Amount: 04-18-15 "1/2 bottle of Aristocrat Vodka"   CIWA: CIWA-Ar BP: 140/76 mmHg Pulse Rate: 76 COWS:    PATIENT STRENGTHS: (choose at least two) Active sense of humor Communication skills  Allergies:  Allergies  Allergen Reactions  . Sulfa Antibiotics Other (See Comments)    Unknown - Childhood    Home Medications:  (Not in a hospital admission)  OB/GYN Status:  No LMP for male patient.  General Assessment Data Location of Assessment: [facility] ED TTS Assessment: In system Is this a Tele or Face-to-Face Assessment?: Tele Assessment Is this an Initial Assessment or a Re-assessment for this encounter?: Initial Assessment Marital status: Single Maiden name: N Living Arrangements: Alone Can pt return to current living arrangement?: Yes Admission Status: Voluntary Is patient capable of signing voluntary admission?: No Referral Source: Self/Family/Friend     Crisis Care Plan Living Arrangements: Alone Name of Psychiatrist: No provider reported at this time.  Name of Therapist: No  provider reported at this time   Education Status Is patient currently in school?: No Current Grade: NA Highest grade of school patient has completed: 8th grade Name of school: None  Contact person: None   Risk  to self with the past 6 months Suicidal Ideation: No Has patient been a risk to self within the past 6 months prior to admission? : No Suicidal Intent: No Has patient had any suicidal intent within the past 6 months prior to admission? : No Is patient at risk for suicide?: No Suicidal Plan?: No Has patient had any suicidal plan within the past 6 months prior to admission? : No Access to Means: No Specify Access to Suicidal Means: NA What has been your use of drugs/alcohol within  the last 12 months?: Pt reported alcohol use. "I drink as often as I can".  Previous Attempts/Gestures: Yes How many times?: 10 Other Self Harm Risks: No other self harm risk identified at this time.  Triggers for Past Attempts: Unpredictable Intentional Self Injurious Behavior: None Family Suicide History: No Recent stressful life event(s): Financial Problems Persecutory voices/beliefs?: Yes Depression: No Depression Symptoms: Tearfulness, Isolating, Fatigue Substance abuse history and/or treatment for substance abuse?: Yes Suicide prevention information given to non-admitted patients: Not applicable  Risk to Others within the past 6 months Homicidal Ideation: No Does patient have any lifetime risk of violence toward others beyond the six months prior to admission? : No Thoughts of Harm to Others: No Current Homicidal Intent: No Current Homicidal Plan: No Access to Homicidal Means: No Identified Victim: NA History of harm to others?: No Assessment of Violence: None Noted Violent Behavior Description: No violent behaviors observed at this time.  Does patient have access to weapons?: No Criminal Charges Pending?: No Does patient have a court date: No Is patient on probation?: No  Psychosis Hallucinations: Auditory Delusions: Unspecified  Mental Status Report Appearance/Hygiene: Disheveled, In scrubs Eye Contact: Good Motor Activity: Restlessness Speech: Tangential Level of Consciousness: Alert Mood: Euphoric, Preoccupied Affect: Preoccupied, Silly Anxiety Level: Moderate Thought Processes: Tangential Judgement: Impaired Orientation: Person, Place, Situation Obsessive Compulsive Thoughts/Behaviors: Moderate  Cognitive Functioning Concentration: Poor Memory: Recent Intact IQ: Average Insight: Poor Impulse Control: Poor Appetite: Good Weight Loss: 0 Weight Gain: 30 ("In the past 2-3 months". ) Sleep: Decreased Total Hours of Sleep: 5 Vegetative Symptoms: Unable  to Assess  ADLScreening [facility] Assessment Services) Patient's cognitive ability adequate to safely complete daily activities?: Yes Patient able to express need for assistance with ADLs?: Yes Independently performs ADLs?: Yes (appropriate for developmental age)  Prior Inpatient Therapy Prior Inpatient Therapy: Yes Prior Therapy Dates: 2013, 2016 Prior Therapy Facilty/Provider([facility]): [facility]  Reason for Treatment: Schizophrenia   Prior Outpatient Therapy Prior Outpatient Therapy: No Prior Therapy Dates: None  Prior Therapy Facilty/Provider(s): None  Reason for Treatment: None  Does patient have an ACCT team?: Unknown Does patient have Intensive In-House Services?  : No Does patient have [facility] services? : Unknown Does patient have P4CC services?: Unknown  ADL Screening (condition at time of admission) Patient's cognitive ability adequate to safely complete daily activities?: Yes Is the patient deaf or have difficulty hearing?: No Does the patient have difficulty seeing, even when wearing glasses/contacts?: No Does the patient have difficulty concentrating, remembering, or making decisions?: Yes Patient able to express need for assistance with ADLs?: Yes Does the patient have difficulty dressing or bathing?: No Independently performs ADLs?: Yes (appropriate for developmental age)       Abuse/Neglect Assessment (Assessment to be complete while patient is alone) Physical Abuse: Denies Verbal Abuse: Denies Sexual Abuse: Denies Exploitation of patient/patient's resources: Denies Self-Neglect: Denies     Merchant navy officer (For Healthcare) Does patient have an advance directive?: No Would patient like information on  creating an advanced directive?: No - patient declined information    Additional Information 1:1 In Past 12 Months?: No CIRT Risk: No Elopement Risk: No Does patient have medical clearance?: Yes     Disposition: Inpatient treatment  Disposition Initial Assessment  Completed for this Encounter: Yes Disposition of Patient: Inpatient treatment program Type of inpatient treatment program: Adult  Virna Livengood S 04/18/2015 10:01 PM

## 2015-04-18 NOTE — ED Provider Notes (Signed)
CSN: [id number]     Arrival date & time 04/18/15  2017 History   None    This chart was scribed for non-physician practitioner, Noland Fordyce PA-C, working with No att. providers found by Forrestine Him, ED Scribe. This patient was seen in room WBH36/WBH36 and the patient's care was started at 9:22 PM.   Chief Complaint  Patient presents with  . Alcohol Intoxication  . Manic Behavior   The history is provided by the patient. No language interpreter was used.    LEVEL 5 CAVEAT DUE TO MANIC BEHAVIOR  HPI Comments: [name] is a 29 y.o. male with a PMHx of DM, depression, schizo affective schizophrenia, DKA, and GERD who presents to the Emergency Department complaining of ongoing depression this evening. Pt was picked up and brought to ED via EMS this evening for ETOH intoxication and "feeling bad in my head and need help". No illicit drug use. Pt denies any SI/HI at this time. [name] has been without his medication for several days as he is banned from facility that provides his medications. Pt with known allergy to Sulfa antibiotics.  Past Medical History  Diagnosis Date  . Diabetes mellitus   . Depression   . Schizo affective schizophrenia   . Polysubstance abuse   . Scoliosis   . Chronic pain   . Noncompliance with medication regimen   . DKA (diabetic ketoacidoses) 11/18/2014  . GERD (gastroesophageal reflux disease)   . Arthritis     "both hips" (12/21/2014)   History reviewed. No pertinent past surgical history. Family History  Problem Relation Age of Onset  . Diabetes Mother    History  Substance Use Topics  . Smoking status: Current Some Day Smoker -- 1.00 packs/day for 7 years    Types: Cigarettes  . Smokeless tobacco: Never Used  . Alcohol Use: Yes     Comment: 12/21/2014 "I drink on a rare occasion"    Review of Systems  Unable to perform ROS: Psychiatric disorder      Allergies  Sulfa antibiotics  Home Medications   Prior to Admission  medications   Medication Sig Start Date End Date Taking? Authorizing Provider  amphetamine-dextroamphetamine (ADDERALL) 20 MG tablet Take 40 mg by mouth 2 (two) times daily.   Yes Historical Provider, MD  benztropine (COGENTIN) 0.5 MG tablet Take 1 tablet (0.5 mg total) by mouth 2 (two) times daily. For prevention of drug induced tremors 03/29/15  Yes Tresa Garter, MD  divalproex (DEPAKOTE) 500 MG DR tablet Take 1 Tablet (500 mg) daily & 1.5 tablets (750 mg) at bedtime: For mood stabilization 03/29/15  Yes Tresa Garter, MD  gabapentin (NEURONTIN) 300 MG capsule Take 1 capsule (300 mg total) by mouth 3 (three) times daily at 8am, 3pm and bedtime. For agitation 03/29/15  Yes Tresa Garter, MD  glucose blood (FREESTYLE LITE) test strip Use as instructed 03/29/15  Yes Olugbemiga E Doreene Burke, MD  glucose monitoring kit (FREESTYLE) monitoring kit 1 each by Does not apply route as needed for other. Test blood sugars three daily and when needed: For blood sugar checks 03/29/15  Yes Tresa Garter, MD  hydrOXYzine (ATARAX/VISTARIL) 25 MG tablet Take 1 tablet (25 mg total) by mouth 3 (three) times daily as needed for anxiety. 03/29/15  Yes Olugbemiga Essie Christine, MD  insulin aspart (NOVOLOG) 100 UNIT/ML injection Inject 0-9 Units into the skin 3 (three) times daily with meals. CBG < 70: Eat or drink something sweet and recheck.  CBG 70 - 120: 0 units CBG 121 - 150: 1 unit CBG 151 - 200: 2 units: Blood sugar sliding scale insulin coverage. CBG 201 - 250: 3 units CBG 251 - 300: 5 units CBG 301 - 350: 7 units CBG 351 - 400: 9 units CBG > 400: call MD. 04/01/15  Yes Delfin Gant, NP  insulin aspart protamine- aspart (NOVOLOG MIX 70/30) (70-30) 100 UNIT/ML injection Inject 0.4 mLs (40 Units total) into the skin 2 (two) times daily with a meal. For diabetes management 03/29/15  Yes Olugbemiga Essie Christine, MD  INSULIN SYRINGE .5CC/29G 29G X 1/2" 0.5 ML MISC 1 vial by Does not apply route 2 (two) times  daily. For diabetes management 03/29/15  Yes Tresa Garter, MD  Lancets (FREESTYLE) lancets Use as instructed: For blood sugar checks 03/29/15  Yes Tresa Garter, MD  risperiDONE (RISPERDAL M-TABS) 2 MG disintegrating tablet Take 1 tablet (2 mg) daily & 2 tablets (4 mg) at bedtime: For mood control 03/29/15  Yes Tresa Garter, MD  traZODone (DESYREL) 100 MG tablet Take 2 tablets (200 mg total) by mouth at bedtime. For sleep Patient taking differently: Take 300 mg by mouth at bedtime. For sleep 03/29/15  Yes Tresa Garter, MD   Triage Vitals: BP 121/72 mmHg  Pulse 119  Temp(Src) 98.7 F (37.1 C) (Oral)  Resp 20  SpO2 98%   Physical Exam  Constitutional: He is oriented to person, place, and time. He appears well-developed and well-nourished.  HENT:  Head: Normocephalic and atraumatic.  Eyes: EOM are normal.  Neck: Normal range of motion.  Cardiovascular: Normal rate.   Pulmonary/Chest: Effort normal.  Musculoskeletal: Normal range of motion.  Neurological: He is alert and oriented to person, place, and time.  Skin: Skin is warm and dry.  Psychiatric: Thought content normal. His affect is blunt and inappropriate. His speech is rapid and/or pressured. He is hyperactive. He is not aggressive. He expresses impulsivity. He expresses no homicidal and no suicidal ideation.  Pt is loud, hyperactive, flirting with staff members, asking repeatedly for food despite already having a sandwich.  Difficulty redirecting pt.  Pt not aggressive.   Nursing note and vitals reviewed.   ED Course  Procedures (including critical care time)  DIAGNOSTIC STUDIES: Oxygen Saturation is 100% on RA, Normal by my interpretation.    COORDINATION OF CARE: 5:34 AM-Discussed treatment plan with pt at bedside and pt agreed to plan.     Labs Review Labs Reviewed  ACETAMINOPHEN LEVEL - Abnormal; Notable for the following:    Acetaminophen (Tylenol), Serum <10 (*)    All other components within  normal limits  COMPREHENSIVE METABOLIC PANEL - Abnormal; Notable for the following:    Glucose, Bld 283 (*)    Creatinine, Ser 0.55 (*)    Calcium 8.8 (*)    All other components within normal limits  ETHANOL - Abnormal; Notable for the following:    Alcohol, Ethyl (B) 268 (*)    All other components within normal limits  URINE RAPID DRUG SCREEN (HOSP PERFORMED) - Abnormal; Notable for the following:    Tetrahydrocannabinol POSITIVE (*)    All other components within normal limits  CBG MONITORING, ED - Abnormal; Notable for the following:    Glucose-Capillary 281 (*)    All other components within normal limits  CBG MONITORING, ED - Abnormal; Notable for the following:    Glucose-Capillary 310 (*)    All other components within normal limits  CBC  SALICYLATE LEVEL    Imaging Review No results found.   EKG Interpretation None      MDM   Final diagnoses:  Depression    Pt is an insulin dependent diabetic, brought to ED by [employer] for depression and manic behavior. Pt does report drinking alcohol this evening. Pt is hyperactive and inappropriate in triage and during exam, flirting with all staff members.  Pt difficult to redirect but not aggressive. Denies SI/HI. Psych hold orders placed. CBG elevated, however, not concerned for DKA at this time. Home medications ordered.  TTS to evaluate pt to help determine treatment plan and disposition of pt.    10:04 PM Consulted with TTS. Pt does meet inpatient criteria, placement being sought.    I personally performed the services described in this documentation, which was scribed in my presence. The recorded information has been reviewed and is accurate.   Noland Fordyce, PA-C 04/19/15 [id number]  Orlie Dakin, MD 04/20/15 [phone]

## 2015-04-18 NOTE — ED Notes (Signed)
Pt arrives to the ER via EMS for ETOH intoxication and "feeling bad in my head"; pt denies SI; pt denies drug use; pt reports drinking for last several hours; [employer] reports that pt is banned at the present time where he gets his medication; pt reports not currently taking his medications

## 2015-04-18 NOTE — ED Notes (Signed)
Patient showered. Appears drowsy. Remains restless. Continues to make sexually inappropriate statements.  Patient requests Ativan IM and more food.

## 2015-04-18 NOTE — BH Assessment (Signed)
Assessment completed. Consulted [name], NP who recommended inpatient treatment. [name] has been informed of the recommendation.

## 2015-04-19 ENCOUNTER — Encounter (HOSPITAL_COMMUNITY): Payer: Self-pay | Admitting: *Deleted

## 2015-04-19 ENCOUNTER — Emergency Department (HOSPITAL_COMMUNITY)
Admission: EM | Admit: 2015-04-19 | Discharge: 2015-04-20 | Disposition: A | Discharge status: Home / Self Care | Payer: PPO | Attending: Emergency Medicine | Admitting: Emergency Medicine

## 2015-04-19 DIAGNOSIS — Z8719 Personal history of other diseases of the digestive system: Secondary | ICD-10-CM | POA: Diagnosis not present

## 2015-04-19 DIAGNOSIS — Z9114 Patient's other noncompliance with medication regimen: Secondary | ICD-10-CM | POA: Insufficient documentation

## 2015-04-19 DIAGNOSIS — Z720 Tobacco use: Secondary | ICD-10-CM | POA: Insufficient documentation

## 2015-04-19 DIAGNOSIS — E119 Type 2 diabetes mellitus without complications: Secondary | ICD-10-CM | POA: Diagnosis not present

## 2015-04-19 DIAGNOSIS — R45851 Suicidal ideations: Secondary | ICD-10-CM | POA: Insufficient documentation

## 2015-04-19 DIAGNOSIS — F32A Depression, unspecified: Secondary | ICD-10-CM | POA: Diagnosis present

## 2015-04-19 DIAGNOSIS — M419 Scoliosis, unspecified: Secondary | ICD-10-CM | POA: Insufficient documentation

## 2015-04-19 DIAGNOSIS — Z794 Long term (current) use of insulin: Secondary | ICD-10-CM | POA: Insufficient documentation

## 2015-04-19 DIAGNOSIS — Z79899 Other long term (current) drug therapy: Secondary | ICD-10-CM | POA: Insufficient documentation

## 2015-04-19 DIAGNOSIS — F329 Major depressive disorder, single episode, unspecified: Secondary | ICD-10-CM | POA: Diagnosis present

## 2015-04-19 DIAGNOSIS — R4589 Other symptoms and signs involving emotional state: Secondary | ICD-10-CM

## 2015-04-19 DIAGNOSIS — G8929 Other chronic pain: Secondary | ICD-10-CM | POA: Insufficient documentation

## 2015-04-19 DIAGNOSIS — M1990 Unspecified osteoarthritis, unspecified site: Secondary | ICD-10-CM | POA: Diagnosis not present

## 2015-04-19 DIAGNOSIS — R4689 Other symptoms and signs involving appearance and behavior: Secondary | ICD-10-CM

## 2015-04-19 LAB — COMPREHENSIVE METABOLIC PANEL
ALBUMIN: 4.5 g/dL (ref 3.5–5.0)
ALT: 19 U/L (ref 17–63)
AST: 18 U/L (ref 15–41)
Alkaline Phosphatase: 69 U/L (ref 38–126)
Anion gap: 10 (ref 5–15)
BUN: 19 mg/dL (ref 6–20)
CALCIUM: 9.9 mg/dL (ref 8.9–10.3)
CO2: 30 mmol/L (ref 22–32)
Chloride: 98 mmol/L — ABNORMAL LOW (ref 101–111)
Creatinine, Ser: 0.63 mg/dL (ref 0.61–1.24)
GFR calc non Af Amer: >60 mL/min (ref >60)
GLUCOSE: 409 mg/dL — AB (ref 70–99)
Potassium: 4.3 mmol/L (ref 3.5–5.1)
Sodium: 138 mmol/L (ref 135–145)
Total Bilirubin: 0.6 mg/dL (ref 0.3–1.2)
Total Protein: 6.9 g/dL (ref 6.5–8.1)

## 2015-04-19 LAB — CBC
HCT: 40.4 % (ref 39.0–52.0)
HEMOGLOBIN: 13.9 g/dL (ref 13.0–17.0)
MCH: 30.3 pg (ref 26.0–34.0)
MCHC: 34.4 g/dL (ref 30.0–36.0)
MCV: 88.2 fL (ref 78.0–100.0)
PLATELETS: 272 10*3/uL (ref 150–400)
RBC: 4.58 MIL/uL (ref 4.22–5.81)
RDW: 13.8 % (ref 11.5–15.5)
WBC: 7 10*3/uL (ref 4.0–10.5)

## 2015-04-19 LAB — RAPID URINE DRUG SCREEN, HOSP PERFORMED
Amphetamines: NOT DETECTED
Barbiturates: NOT DETECTED
Benzodiazepines: NOT DETECTED
Cocaine: NOT DETECTED
Opiates: NOT DETECTED
TETRAHYDROCANNABINOL: NOT DETECTED

## 2015-04-19 LAB — CBG MONITORING, ED
GLUCOSE-CAPILLARY: 323 mg/dL — AB (ref 70–99)
GLUCOSE-CAPILLARY: 156 mg/dL — AB (ref 70–99)
Glucose-Capillary: 294 mg/dL — ABNORMAL HIGH (ref 70–99)
Glucose-Capillary: 41 mg/dL — CL (ref 70–99)

## 2015-04-19 LAB — SALICYLATE LEVEL: Salicylate Lvl: <4 mg/dL (ref 2.8–30.0)

## 2015-04-19 LAB — ETHANOL: Alcohol, Ethyl (B): <5 mg/dL (ref <5)

## 2015-04-19 LAB — ACETAMINOPHEN LEVEL: Acetaminophen (Tylenol), Serum: <10 ug/mL — ABNORMAL LOW (ref 10–30)

## 2015-04-19 MED ORDER — AMPHETAMINE-DEXTROAMPHETAMINE 20 MG PO TABS
20.0000 mg | ORAL_TABLET | Freq: Two times a day (BID) | ORAL | Status: DC
Start: 2015-04-19 — End: 2015-04-19

## 2015-04-19 MED ORDER — LORAZEPAM 1 MG PO TABS
1.0000 mg | ORAL_TABLET | Freq: Once | ORAL | Status: AC
  Administered 2015-04-19: 1 mg via ORAL
  Filled 2015-04-19: qty 1

## 2015-04-19 MED ORDER — TRAZODONE HCL 100 MG PO TABS: 200.0000 mg | ORAL_TABLET | Freq: Every day | ORAL | Status: DC

## 2015-04-19 MED ORDER — BENZTROPINE MESYLATE 0.5 MG PO TABS: 0.5000 mg | ORAL_TABLET | Freq: Two times a day (BID) | ORAL | Status: DC

## 2015-04-19 NOTE — ED Notes (Signed)
Patient awake. States that he is bored. Request Ativan IM, request benzo so he can sleep. Patient informed that he has had Ativan. Patient continues to request food. Encouraged to try to rest.

## 2015-04-19 NOTE — ED Notes (Signed)
Pt came to the ER via for complaints of suicidal; pt denies having a specific plan; pt states that he has been drinking today; pt was just seen here and released at this afternoon; pt states that he does not feel any better and that is why he is back

## 2015-04-19 NOTE — ED Notes (Signed)
MD notified of CBG level of 41.  Patient was given a snack and will be rechecked in one hour.

## 2015-04-19 NOTE — Consult Note (Signed)
[facility] Face-to-Face Psychiatry Consult   Reason for Consult:  Depression Referring Physician:  EDP Patient Identification: [name] MRN:  [id number] Principal Diagnosis: Depression Diagnosis:   Patient Active Problem List   Diagnosis Date Noted  . Depression [F32.9]     Priority: High  . Hip pain [M25.559] 03/29/2015  . Schizoaffective disorder, unspecified type [F25.9] 03/29/2015  . Generalized anxiety disorder [F41.1] 03/29/2015  . Undifferentiated schizophrenia [F20.3]   . Schizophrenia [F20.9] 03/20/2015  . HCAP (healthcare-associated pneumonia) [J18.9] 01/23/2015  . Hyperglycemia [R73.9] 12/21/2014  . DKA (diabetic ketoacidoses) [E13.10] 11/18/2014  . Hip pain, bilateral [M25.551, M25.552] 11/18/2014  . DKA, type 1 [E10.10] 06/17/2012  . Hyponatremia [E87.1] 06/17/2012  . Dehydration [E86.0] 06/17/2012  . Diabetes type 1, uncontrolled [E10.65] 02/19/2012    Total Time spent with patient: 25 minutes  Subjective:   [name] is a 29 y.o. male patient admitted with reports of worsening depression. Pt seen and chart reviewed with Dr. Jannifer Franklin. Pt continues to report depression, yet immediately changed the topic of conversation to a request for Adderall. Pt reports that he "needs it and it's the only way I can go throughout my day and get going". Pt explained that he feels others are not sensitive to his medication needs and that he does not want [facility] due to the fact that they denied Adderall but prescribed other medications for him. Pt is fixated on an Adderall prescription and has an appointment with Dr. Ronne Binning at [facility] where he plans to ask for such. Pt denies suicidal/homicidal ideation and psychosis and does not appear to be responding to internal stimuli.   HPI:   [name] is an 29 y.o. male presenting to [facility] reporting depression. PT stated "I am depressed but I made a girlfriend today". 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 "I get to play like this". "It's fun". "I'm playing like a retard". "All I need is a girlfriend and I won't cry anymore". Pt denies SI, HI and AVH at this time; however when this writer left pt's room pt began talking to himself. Pt reported that he has attempted suicide in the past but stated "I don't want to talk about that it's in the past".Pt reported that he drunk a 1/2 bottle of aristocrat vodka prior to coming to the ED. Pt did not report any illicit substance abuse; however his UDS is positive for marijuana. Pt is alert and oriented x3. Pt is cooperative at this time. Pt maintained good eye contact throughout this assessment. Pt speech is loud. Pt appears manic and responding to internal stimuli. Pt stated repeatedly throughout this assessment that he had a girlfriend. PT reported that he has not been compliant with his medication; however he was unable to recall the last time he took his medication.   HPI Elements:   Location:  Psychiatric. Quality:  Improving. Severity:  Moderate. Timing:  Intermittent. Duration:  Transient. Context:  Exacerbation of underlying Schizoaffective/depression secondary to severe alcohol intoxication without withdrawal symptoms noted.  Past Medical History:  Past Medical History  Diagnosis Date  . Diabetes mellitus   . Depression   . Schizo affective schizophrenia   . Polysubstance abuse   . Scoliosis   . Chronic pain   . Noncompliance with medication regimen   . DKA (diabetic ketoacidoses) 11/18/2014  . GERD (gastroesophageal reflux disease)   . Arthritis     "both hips" (12/21/2014)  History reviewed. No pertinent past surgical history. Family History:  Family History  Problem Relation Age of Onset  . Diabetes Mother    Social History:  History  Alcohol Use  . Yes    Comment: 12/21/2014 "I drink on a rare occasion"     History  Drug Use  . Yes  . Special: Marijuana    Comment: 12/21/2014 "I use marijuana on a  rare occasion", last used marijuana 01/30/2015    History   Social History  . Marital Status: Married    Spouse Name: N/A  . Number of Children: N/A  . Years of Education: N/A   Social History Main Topics  . Smoking status: Current Some Day Smoker -- 1.00 packs/day for 7 years    Types: Cigarettes  . Smokeless tobacco: Never Used  . Alcohol Use: Yes     Comment: 12/21/2014 "I drink on a rare occasion"  . Drug Use: Yes    Special: Marijuana     Comment: 12/21/2014 "I use marijuana on a rare occasion", last used marijuana 01/30/2015  . Sexual Activity: Yes   Other Topics Concern  . None   Social History Narrative   ** Merged History Encounter **       Additional Social History:      Name of Substance 1: Alcohol  1 - Age of First Use: 10 1 - Amount (size/oz): "1/2 bottle" 1 - Frequency: 3x's Wkly "everytime I can"  1 - Duration: On-going  1 - Last Use / Amount: 04-18-15 "1/2 bottle of Aristocrat Vodka"                    Allergies:   Allergies  Allergen Reactions  . Sulfa Antibiotics Other (See Comments)    Unknown - Childhood    Labs:  Results for orders placed or performed during the hospital encounter of 04/18/15 (from the past 48 hour(s))  CBG monitoring, ED     Status: Abnormal   Collection Time: 04/18/15  8:31 PM  Result Value Ref Range   Glucose-Capillary 281 (H) 70 - 99 mg/dL   Comment 1 Notify RN   Acetaminophen level     Status: Abnormal   Collection Time: 04/18/15  8:34 PM  Result Value Ref Range   Acetaminophen (Tylenol), Serum <10 (L) 10 - 30 ug/mL    Comment:        THERAPEUTIC CONCENTRATIONS VARY SIGNIFICANTLY. A RANGE OF 10-30 ug/mL MAY BE AN EFFECTIVE CONCENTRATION FOR MANY PATIENTS. HOWEVER, SOME ARE BEST TREATED AT CONCENTRATIONS OUTSIDE THIS RANGE. ACETAMINOPHEN CONCENTRATIONS >150 ug/mL AT 4 HOURS AFTER INGESTION AND >50 ug/mL AT 12 HOURS AFTER INGESTION ARE OFTEN ASSOCIATED WITH TOXIC REACTIONS.   Ethanol (ETOH)     Status:  Abnormal   Collection Time: 04/18/15  8:34 PM  Result Value Ref Range   Alcohol, Ethyl (B) 268 (H) <5 mg/dL    Comment:        LOWEST DETECTABLE LIMIT FOR SERUM ALCOHOL IS 11 mg/dL FOR MEDICAL PURPOSES ONLY   Salicylate level     Status: None   Collection Time: 04/18/15  8:34 PM  Result Value Ref Range   Salicylate Lvl <4.4 2.8 - 30.0 mg/dL  CBC     Status: None   Collection Time: 04/18/15  8:35 PM  Result Value Ref Range   WBC 6.4 4.0 - 10.5 K/uL   RBC 5.11 4.22 - 5.81 MIL/uL   Hemoglobin 15.2 13.0 - 17.0 g/dL   HCT 45.3  39.0 - 52.0 %   MCV 88.6 78.0 - 100.0 fL   MCH 29.7 26.0 - 34.0 pg   MCHC 33.6 30.0 - 36.0 g/dL   RDW 14.1 11.5 - 15.5 %   Platelets 305 150 - 400 K/uL  Comprehensive metabolic panel     Status: Abnormal   Collection Time: 04/18/15  8:35 PM  Result Value Ref Range   Sodium 143 135 - 145 mmol/L   Potassium 3.8 3.5 - 5.1 mmol/L   Chloride 106 101 - 111 mmol/L   CO2 25 22 - 32 mmol/L   Glucose, Bld 283 (H) 70 - 99 mg/dL   BUN 11 6 - 20 mg/dL   Creatinine, Ser 0.55 (L) 0.61 - 1.24 mg/dL   Calcium 8.8 (L) 8.9 - 10.3 mg/dL   Total Protein 7.0 6.5 - 8.1 g/dL   Albumin 4.5 3.5 - 5.0 g/dL   AST 20 15 - 41 U/L   ALT 19 17 - 63 U/L   Alkaline Phosphatase 70 38 - 126 U/L   Total Bilirubin 0.6 0.3 - 1.2 mg/dL   GFR calc non Af Amer >60 >60 mL/min   GFR calc Af Amer >60 >60 mL/min    Comment: (NOTE) The eGFR has been calculated using the CKD EPI equation. This calculation has not been validated in all clinical situations. eGFR's persistently <90 mL/min signify possible Chronic Kidney Disease.    Anion gap 12 5 - 15  Urine Drug Screen     Status: Abnormal   Collection Time: 04/18/15  8:36 PM  Result Value Ref Range   Opiates NONE DETECTED NONE DETECTED   Cocaine NONE DETECTED NONE DETECTED   Benzodiazepines NONE DETECTED NONE DETECTED   Amphetamines NONE DETECTED NONE DETECTED   Tetrahydrocannabinol POSITIVE (A) NONE DETECTED   Barbiturates NONE DETECTED  NONE DETECTED    Comment:        DRUG SCREEN FOR MEDICAL PURPOSES ONLY.  IF CONFIRMATION IS NEEDED FOR ANY PURPOSE, NOTIFY LAB WITHIN 5 DAYS.        LOWEST DETECTABLE LIMITS FOR URINE DRUG SCREEN Drug Class       Cutoff (ng/mL) Amphetamine      1000 Barbiturate      200 Benzodiazepine   335 Tricyclics       456 Opiates          300 Cocaine          300 THC              50   CBG monitoring, ED     Status: Abnormal   Collection Time: 04/18/15  9:52 PM  Result Value Ref Range   Glucose-Capillary 310 (H) 70 - 99 mg/dL  CBG monitoring, ED     Status: Abnormal   Collection Time: 04/18/15 10:31 PM  Result Value Ref Range   Glucose-Capillary 294 (H) 70 - 99 mg/dL  CBG monitoring, ED     Status: Abnormal   Collection Time: 04/19/15  7:56 AM  Result Value Ref Range   Glucose-Capillary 323 (H) 70 - 99 mg/dL  CBG monitoring, ED     Status: Abnormal   Collection Time: 04/19/15 10:46 AM  Result Value Ref Range   Glucose-Capillary 41 (LL) 70 - 99 mg/dL  CBG monitoring, ED     Status: Abnormal   Collection Time: 04/19/15 11:59 AM  Result Value Ref Range   Glucose-Capillary 156 (H) 70 - 99 mg/dL    Vitals: Blood pressure 123/76, pulse 109, temperature  97.7 F (36.5 C), temperature source Oral, resp. rate 18, SpO2 99 %.  Risk to Self: Suicidal Ideation: No Suicidal Intent: No Is patient at risk for suicide?: No Suicidal Plan?: No Access to Means: No Specify Access to Suicidal Means: NA What has been your use of drugs/alcohol within the last 12 months?: Pt reported alcohol use. "I drink as often as I can".  How many times?: 10 Other Self Harm Risks: No other self harm risk identified at this time.  Triggers for Past Attempts: Unpredictable Intentional Self Injurious Behavior: None Risk to Others: Homicidal Ideation: No Thoughts of Harm to Others: No Current Homicidal Intent: No Current Homicidal Plan: No Access to Homicidal Means: No Identified Victim: NA History of harm to  others?: No Assessment of Violence: None Noted Violent Behavior Description: No violent behaviors observed at this time.  Does patient have access to weapons?: No Criminal Charges Pending?: No Does patient have a court date: No Prior Inpatient Therapy: Prior Inpatient Therapy: Yes Prior Therapy Dates: 2013, 2016 Prior Therapy Facilty[facility]Provider[facility]): [facility]  Reason for Treatment: Schizophrenia  Prior Outpatient Therapy: Prior Outpatient Therapy: No Prior Therapy Dates: None  Prior Therapy Facilty/Provider(s): None  Reason for Treatment: None  Does patient have an ACCT team?: Unknown Does patient have Intensive In-House Services?  : No Does patient have [facility] services? : Unknown Does patient have [facility] services?: Unknown  Current Facility-Administered Medications  Medication Dose Route Frequency Provider Last Rate Last Dose  . acetaminophen (TYLENOL) tablet 650 mg  650 mg Oral Q4H PRN Noland Fordyce, PA-C      . alum & mag hydroxide-simeth (MAALOX/MYLANTA) 200-200-20 MG/5ML suspension 30 mL  30 mL Oral PRN Noland Fordyce, PA-C      . benztropine (COGENTIN) tablet 0.5 mg  0.5 mg Oral BID Orlie Dakin, MD   0.5 mg at 04/19/15 1057  . gabapentin (NEURONTIN) capsule 300 mg  300 mg Oral [id number] Noland Fordyce, PA-C   300 mg at 04/19/15 6979  . hydrOXYzine (ATARAX/VISTARIL) tablet 25 mg  25 mg Oral TID PRN Orlie Dakin, MD   25 mg at 04/18/15 2303  . ibuprofen (ADVIL,MOTRIN) tablet 600 mg  600 mg Oral Q8H PRN Noland Fordyce, PA-C   600 mg at 04/18/15 2303  . insulin aspart (novoLOG) injection 0-9 Units  0-9 Units Subcutaneous TID WC Noland Fordyce, PA-C   7 Units at 04/19/15 0847  . insulin aspart protamine- aspart (NOVOLOG MIX 70/30) injection 40 Units  40 Units Subcutaneous BID WC Noland Fordyce, PA-C   40 Units at 04/19/15 0850  . LORazepam (ATIVAN) injection 0-4 mg  0-4 mg Intramuscular 4 times per day Orlie Dakin, MD   2 mg at 04/18/15 2335  . LORazepam (ATIVAN) injection 0-4 mg  0-4 mg  Intramuscular Q12H Orlie Dakin, MD      . LORazepam (ATIVAN) tablet 0-4 mg  0-4 mg Oral 4 times per day Orlie Dakin, MD   0 mg at 04/18/15 2346  . LORazepam (ATIVAN) tablet 0-4 mg  0-4 mg Oral Q12H Orlie Dakin, MD   0 mg at 04/18/15 2336  . LORazepam (ATIVAN) tablet 1 mg  1 mg Oral Q8H PRN Noland Fordyce, PA-C   1 mg at 04/18/15 2146  . nicotine polacrilex (NICORETTE) gum 2 mg  2 mg Oral PRN Noland Fordyce, PA-C   2 mg at 04/19/15 0248  . ondansetron (ZOFRAN) tablet 4 mg  4 mg Oral Q8H PRN Noland Fordyce, PA-C      . risperiDONE (RISPERDAL M-TABS)  disintegrating tablet 2 mg  2 mg Oral Daily Orlie Dakin, MD   2 mg at 04/19/15 1057  . risperiDONE (RISPERDAL M-TABS) disintegrating tablet 4 mg  4 mg Oral QHS Orlie Dakin, MD   4 mg at 04/18/15 2332  . thiamine (B-1) injection 100 mg  100 mg Intravenous Daily Orlie Dakin, MD   0 mg at 04/19/15 1059  . thiamine (VITAMIN B-1) tablet 100 mg  100 mg Oral Daily Orlie Dakin, MD   100 mg at 04/19/15 1057  . traZODone (DESYREL) tablet 200 mg  200 mg Oral QHS Orlie Dakin, MD   200 mg at 04/19/15 0247   Current Outpatient Prescriptions  Medication Sig Dispense Refill  . amphetamine-dextroamphetamine (ADDERALL) 20 MG tablet Take 40 mg by mouth 2 (two) times daily.    . benztropine (COGENTIN) 0.5 MG tablet Take 1 tablet (0.5 mg total) by mouth 2 (two) times daily. For prevention of drug induced tremors 60 tablet 3  . divalproex (DEPAKOTE) 500 MG DR tablet Take 1 Tablet (500 mg) daily & 1.5 tablets (750 mg) at bedtime: For mood stabilization 75 tablet 3  . gabapentin (NEURONTIN) 300 MG capsule Take 1 capsule (300 mg total) by mouth 3 (three) times daily at 8am, 3pm and bedtime. For agitation 90 capsule 3  . glucose blood (FREESTYLE LITE) test strip Use as instructed 100 each 0  . glucose monitoring kit (FREESTYLE) monitoring kit 1 each by Does not apply route as needed for other. Test blood sugars three daily and when needed: For blood sugar  checks 1 each 1  . hydrOXYzine (ATARAX/VISTARIL) 25 MG tablet Take 1 tablet (25 mg total) by mouth 3 (three) times daily as needed for anxiety. 60 tablet 3  . insulin aspart (NOVOLOG) 100 UNIT/ML injection Inject 0-9 Units into the skin 3 (three) times daily with meals. CBG < 70: Eat or drink something sweet and recheck. CBG 70 - 120: 0 units CBG 121 - 150: 1 unit CBG 151 - 200: 2 units: Blood sugar sliding scale insulin coverage. CBG 201 - 250: 3 units CBG 251 - 300: 5 units CBG 301 - 350: 7 units CBG 351 - 400: 9 units CBG > 400: call MD. 10 mL 11  . insulin aspart protamine- aspart (NOVOLOG MIX 70/30) (70-30) 100 UNIT/ML injection Inject 0.4 mLs (40 Units total) into the skin 2 (two) times daily with a meal. For diabetes management 30 mL 3  . INSULIN SYRINGE .5CC/29G 29G X 1/2" 0.5 ML MISC 1 vial by Does not apply route 2 (two) times daily. For diabetes management 200 each 3  . Lancets (FREESTYLE) lancets Use as instructed: For blood sugar checks 100 each 3  . risperiDONE (RISPERDAL M-TABS) 2 MG disintegrating tablet Take 1 tablet (2 mg) daily & 2 tablets (4 mg) at bedtime: For mood control 90 tablet 3  . traZODone (DESYREL) 100 MG tablet Take 2 tablets (200 mg total) by mouth at bedtime. For sleep (Patient taking differently: Take 300 mg by mouth at bedtime. For sleep) 60 tablet 3  . [DISCONTINUED] citalopram (CELEXA) 20 MG tablet Take 20 mg by mouth daily.    . [DISCONTINUED] FLUoxetine (PROZAC) 20 MG tablet Take 20 mg by mouth every morning.    . [DISCONTINUED] insulin glargine (LANTUS) 100 UNIT/ML injection Inject 30 Units into the skin at bedtime. 10 mL 3  . [DISCONTINUED] insulin lispro (HUMALOG) 100 UNIT/ML injection Inject 10 Units into the skin 3 (three) times daily before meals.      . [  DISCONTINUED] OLANZapine zydis (ZYPREXA) 10 MG disintegrating tablet Take 10 mg by mouth 2 (two) times daily.    . [DISCONTINUED] sertraline (ZOLOFT) 50 MG tablet Take 50 mg by mouth daily. For  depression. Just started med, have not picked up yet [facility] [address]      Musculoskeletal: Strength & Muscle Tone: within normal limits Gait & Station: normal Patient leans: N/A  Psychiatric Specialty Exam:     Blood pressure 123/76, pulse 109, temperature 97.7 F (36.5 C), temperature source Oral, resp. rate 18, SpO2 99 %.There is no weight on file to calculate BMI.  General Appearance: Casual and Fairly Groomed  Engineer, water::  Good  Speech:  Clear and Coherent and Normal Rate  Volume:  Normal  Mood:  Anxious and Irritable  Affect:  Appropriate and Congruent  Thought Process:  Coherent and Goal Directed  Orientation:  Full (Time, Place, and Person)  Thought Content:  WDL  Suicidal Thoughts:  No  Homicidal Thoughts:  No  Memory:  Immediate;   Good Recent;   Good  Judgement:  Fair  Insight:  Fair  Psychomotor Activity:  Normal  Concentration:  Good  Recall:  Good  Fund of Knowledge:Fair  Language: Good  Akathisia:  No  Handed:    AIMS (if indicated):     Assets:  Communication Skills Desire for Improvement Resilience  ADL's:  Intact  Cognition: WNL  Sleep:      Medical Decision Making: Established Problem, Stable/Improving (1), Self-Limited or Minor (1), Review of Psycho-Social Stressors (1) and Review or order clinical lab tests (1)  Treatment Plan Summary: See below  Plan:  No evidence of imminent risk to self or others at present.   Patient does not meet criteria for psychiatric inpatient admission. Supportive therapy provided about ongoing stressors. Refer to IOP. Discussed crisis plan, support from social network, calling 911, coming to the Emergency Department, and calling Suicide Hotline.  Disposition:  -Discharge home with instructions to keep his current followup appointments -Trazodone 200mg  qhs PRN insomnia # 14  Benjamine Mola, FNP-BC 04/19/2015 12:12 PM Patient seen face-to-face for psychiatric evaluation, chart reviewed and case discussed  with the physician extender and developed treatment plan. Reviewed the information documented and agree with the treatment plan. Corena Pilgrim, MD

## 2015-04-19 NOTE — [facility] Counselor (Signed)
Offered referral information for outpatient therapy. Pt refused information and stated he "just wants trazadone".   Kateri Plummer, M.S., LPCA, [place], [facility] Licensed Professional Counselor Associate  Triage Specialist  [facility]  Therapeutic Triage Services Phone: [phone] Fax: [phone]

## 2015-04-19 NOTE — ED Provider Notes (Addendum)
CSN: [id number]     Arrival date & time 04/19/15  2101 History  This chart was scribed for Junius Creamer, NP working with Jola Schmidt, MD by Randa Evens, ED Scribe. This patient was seen in room WTR3/WLPT3 and the patient's care was started at 10:29 PM.     Chief Complaint  Patient presents with  . Suicidal   HPI HPI Comments: [name] is a 29 y.o. male who presents to the Emergency Department complaining of severe depression. Pt reports that he is not suicidal but needed to do something. Pt states that he use to take adderall  but is not able to get it prescribed her in [place]. Pt states that he was evaluated in the ED yesterday for similar symptoms. PT reports attempted suicide in the past when he was younger. Pt is requesting anxiety medication.    Past Medical History  Diagnosis Date  . Diabetes mellitus   . Depression   . Schizo affective schizophrenia   . Polysubstance abuse   . Scoliosis   . Chronic pain   . Noncompliance with medication regimen   . DKA (diabetic ketoacidoses) 11/18/2014  . GERD (gastroesophageal reflux disease)   . Arthritis     "both hips" (12/21/2014)   History reviewed. No pertinent past surgical history. Family History  Problem Relation Age of Onset  . Diabetes Mother    History  Substance Use Topics  . Smoking status: Current Some Day Smoker -- 1.00 packs/day for 7 years    Types: Cigarettes  . Smokeless tobacco: Never Used  . Alcohol Use: Yes     Comment: 12/21/2014 "I drink on a rare occasion"    Review of Systems  Psychiatric/Behavioral: Positive for suicidal ideas.    Allergies  Sulfa antibiotics  Home Medications   Prior to Admission medications   Medication Sig Start Date End Date Taking? Authorizing Provider  acetaminophen-codeine (TYLENOL #3) 300-30 MG per tablet Take 1 tablet by mouth 4 (four) times daily as needed for moderate pain.   Yes Historical Provider, MD  amphetamine-dextroamphetamine (ADDERALL) 20 MG  tablet Take 20 mg by mouth 2 (two) times daily. 02/25/15  Yes Historical Provider, MD  glucose blood (FREESTYLE LITE) test strip Use as instructed 03/29/15  Yes Olugbemiga E Doreene Burke, MD  glucose monitoring kit (FREESTYLE) monitoring kit 1 each by Does not apply route as needed for other. Test blood sugars three daily and when needed: For blood sugar checks 03/29/15  Yes Olugbemiga Essie Christine, MD  insulin aspart protamine- aspart (NOVOLOG MIX 70/30) (70-30) 100 UNIT/ML injection Inject 0.4 mLs (40 Units total) into the skin 2 (two) times daily with a meal. For diabetes management 03/29/15  Yes Tresa Garter, MD  Lancets (FREESTYLE) lancets Use as instructed: For blood sugar checks 03/29/15  Yes Tresa Garter, MD  traZODone (DESYREL) 100 MG tablet Take 2 tablets (200 mg total) by mouth at bedtime. 04/19/15  Yes Benjamine Mola, FNP  benztropine (COGENTIN) 0.5 MG tablet Take 1 tablet (0.5 mg total) by mouth 2 (two) times daily. For prevention of drug induced tremors Patient not taking: Reported on 04/19/2015 04/19/15   Benjamine Mola, FNP  divalproex (DEPAKOTE) 500 MG DR tablet Take 1 Tablet (500 mg) daily & 1.5 tablets (750 mg) at bedtime: For mood stabilization Patient not taking: Reported on 04/19/2015 03/29/15   Tresa Garter, MD  gabapentin (NEURONTIN) 300 MG capsule Take 1 capsule (300 mg total) by mouth 3 (three) times daily at 8am,  3pm and bedtime. For agitation Patient not taking: Reported on 04/19/2015 03/29/15   Tresa Garter, MD  hydrOXYzine (ATARAX/VISTARIL) 25 MG tablet Take 1 tablet (25 mg total) by mouth 3 (three) times daily as needed for anxiety. Patient not taking: Reported on 04/19/2015 03/29/15   Tresa Garter, MD  insulin aspart (NOVOLOG) 100 UNIT/ML injection Inject 0-9 Units into the skin 3 (three) times daily with meals. CBG < 70: Eat or drink something sweet and recheck. CBG 70 - 120: 0 units CBG 121 - 150: 1 unit CBG 151 - 200: 2 units: Blood sugar sliding scale insulin  coverage. CBG 201 - 250: 3 units CBG 251 - 300: 5 units CBG 301 - 350: 7 units CBG 351 - 400: 9 units CBG > 400: call MD. Patient not taking: Reported on 04/19/2015 04/01/15   Delfin Gant, NP  risperiDONE (RISPERDAL M-TABS) 2 MG disintegrating tablet Take 1 tablet (2 mg) daily & 2 tablets (4 mg) at bedtime: For mood control Patient not taking: Reported on 04/19/2015 03/29/15   Tresa Garter, MD   BP 126/78 mmHg  Pulse 87  Temp(Src) 98.1 F (36.7 C) (Oral)  Resp 18  SpO2 100%   Physical Exam  Constitutional: He appears well-developed.  Disheveled  HENT:  Head: Normocephalic.  Eyes: Pupils are equal, round, and reactive to light.  Neck: Normal range of motion.  Cardiovascular: Normal rate.   Musculoskeletal: Normal range of motion.  Neurological: He is alert.  Skin: Skin is warm.  Psychiatric: His speech is normal and behavior is normal. Cognition and memory are normal. He expresses inappropriate judgment. He exhibits a depressed mood. He expresses suicidal ideation. He expresses no suicidal plans.  Nursing note and vitals reviewed.   ED Course  Procedures (including critical care time) DIAGNOSTIC STUDIES: Oxygen Saturation is 96% on RA, normal by my interpretation.    COORDINATION OF CARE: 10:52 PM-Discussed treatment plan with pt at bedside and pt agreed to plan.     Labs Review Labs Reviewed  ACETAMINOPHEN LEVEL - Abnormal; Notable for the following:    Acetaminophen (Tylenol), Serum <10 (*)    All other components within normal limits  COMPREHENSIVE METABOLIC PANEL - Abnormal; Notable for the following:    Chloride 98 (*)    Glucose, Bld 409 (*)    All other components within normal limits  CBG MONITORING, ED - Abnormal; Notable for the following:    Glucose-Capillary 314 (*)    All other components within normal limits  CBG MONITORING, ED - Abnormal; Notable for the following:    Glucose-Capillary 417 (*)    All other components within normal limits   CBC  ETHANOL  SALICYLATE LEVEL  URINE RAPID DRUG SCREEN (HOSP PERFORMED)    Imaging Review No results found.   EKG Interpretation None      psych ED for assessment MDM   Final diagnoses:  Suicidal behavior    I personally performed the services described in this documentation, which was scribed in my presence. The recorded information has been reviewed and is accurate.       Junius Creamer, NP 04/20/15 [place], NP 04/20/15 [place], MD 04/22/15 [place], NP 04/22/15 [place], MD 04/23/15 [phone]

## 2015-04-19 NOTE — ED Notes (Signed)
Patient discharged to home.  Denies thoughts of harm to self or others.  Denies auditory or visual hallucinations.  All belongings returned to patient.  He was given a [employer] bus pass at his request.  He left the unit ambulatory with instructions and prescription for Trazodone.

## 2015-04-19 NOTE — ED Notes (Signed)
Up to restroom.

## 2015-04-19 NOTE — BHH Suicide Risk Assessment (Signed)
  [facility] Discharge Suicide Risk Assessment   Demographic Factors:  Male, Caucasian and Living alone  Total Time spent with patient: 25 minutes  Musculoskeletal: Strength & Muscle Tone: within normal limits Gait & Station: normal Patient leans: N/A  Psychiatric Specialty Exam:     Blood pressure 123/76, pulse 109, temperature 97.7 F (36.5 C), temperature source Oral, resp. rate 18, SpO2 99 %.There is no weight on file to calculate BMI.     General Appearance: Casual and Fairly Groomed  Patent attorney:: Good  Speech: Clear and Coherent and Normal Rate  Volume: Normal  Mood: Anxious and Irritable  Affect: Appropriate and Congruent  Thought Process: Coherent and Goal Directed  Orientation: Full (Time, Place, and Person)  Thought Content: WDL  Suicidal Thoughts: No  Homicidal Thoughts: No  Memory: Immediate; Good Recent; Good  Judgement: Fair  Insight: Fair  Psychomotor Activity: Normal  Concentration: Good  Recall: Good  Fund of Knowledge:Fair  Language: Good  Akathisia: No  Handed:   AIMS (if indicated):    Assets: Communication Skills Desire for Improvement Resilience  ADL's: Intact  Cognition: WNL  Sleep:             Has this patient used any form of tobacco in the last 30 days? (Cigarettes, Smokeless Tobacco, Cigars, and/or Pipes) Yes, Prescription not provided because: pt not receptive to recommendations at this time unless it is "Adderall"  Mental Status Per Nursing Assessment::   On Admission:     Current Mental Status by Physician: Not suicidal  Loss Factors: Decrease in vocational status  Historical Factors: Impulsivity  Risk Reduction Factors:   Positive social support  Continued Clinical Symptoms:  Depression:   Impulsivity  Cognitive Features That Contribute To Risk:  Closed-mindedness    Suicide Risk:  Minimal: No identifiable suicidal ideation.  Patients presenting with no risk  factors but with morbid ruminations; may be classified as minimal risk based on the severity of the depressive symptoms  Principal Problem: Depression Discharge Diagnoses:  Patient Active Problem List   Diagnosis Date Noted  . Depression [F32.9]     Priority: High  . Hip pain [M25.559] 03/29/2015  . Schizoaffective disorder, unspecified type [F25.9] 03/29/2015  . Generalized anxiety disorder [F41.1] 03/29/2015  . Undifferentiated schizophrenia [F20.3]   . Schizophrenia [F20.9] 03/20/2015  . HCAP (healthcare-associated pneumonia) [J18.9] 01/23/2015  . Hyperglycemia [R73.9] 12/21/2014  . DKA (diabetic ketoacidoses) [E13.10] 11/18/2014  . Hip pain, bilateral [M25.551, M25.552] 11/18/2014  . DKA, type 1 [E10.10] 06/17/2012  . Hyponatremia [E87.1] 06/17/2012  . Dehydration [E86.0] 06/17/2012  . Diabetes type 1, uncontrolled [E10.65] 02/19/2012      Plan Of Care/Follow-up recommendations:  Activity:  As tolerated Diet:  heart healthy with low sodium.  Is patient on multiple antipsychotic therapies at discharge:  No   Has Patient had three or more failed trials of antipsychotic monotherapy by history:  No  Recommended Plan for Multiple Antipsychotic Therapies: NA   Beau Fanny, FNP-BC 04/19/2015, 12:25 PM

## 2015-04-19 NOTE — ED Notes (Signed)
Patient at nurses' station. Requesting Ativan again. Informed none is due at this time. Informed that he needs to rest and that he has had multiple sandwiches and snacks. Patient stated to MHT "I bet if I start yelling and screaming, I can get a shot of Ativan". Patient escorted to room by MHT and helped into bed. Rest encouraged.

## 2015-04-20 DIAGNOSIS — F259 Schizoaffective disorder, unspecified: Secondary | ICD-10-CM

## 2015-04-20 DIAGNOSIS — Z9114 Patient's other noncompliance with medication regimen: Secondary | ICD-10-CM | POA: Diagnosis not present

## 2015-04-20 LAB — CBG MONITORING, ED
GLUCOSE-CAPILLARY: 314 mg/dL — AB (ref 70–99)
GLUCOSE-CAPILLARY: 417 mg/dL — AB (ref 70–99)

## 2015-04-20 NOTE — BH Assessment (Signed)
BHH Assessment Progress Note  Per Thedore Mins, MD, this pt does not require psychiatric hospitalization at this time.  He is to be discharged from [facility] with referral information for [facility] [place].  This information has been included in his discharge instructions.  Pt's nurse, Minerva Areola, has been notified.  Doylene Canning, MA Triage Specialist [phone]

## 2015-04-20 NOTE — ED Notes (Signed)
Patient comes to desk yelling "I need a snack now because of my diabetes I'm sick". Writer tries to explain to patient that she would check his blood sugar and go from there. Patient states " I don't give a fuck about all that I just need a snack!"  Writer tried to encourage patient to lower his voice because other patients was trying to rest. Writer then encourage patient to return to his room to have his CBG checked. Encouragement and support provided and safety maintain. Q 15 min safety checks remain place.

## 2015-04-20 NOTE — BH Assessment (Addendum)
Tele Assessment Note   [name] is an 29 y.o. male.  -Clinician talked with Christopher Hones, NP about need for TTS.  Pt has been to [facility] yesterday with similar complaints .  Patient says he is always anxious, is having some suicidal thoughts.    Patient says that he used to be prescribed Adderall but has not used it in weeks because he ran out of his prescription   Patient says that he went to [facility] to get set up with psychiatric services.  He claims that they told him they do not prescribe Adderall.  Pt is supposed to see a Dr. Ronne [name] about taking him as a new psychiatric patient.    Patient says that he has suicidal thoughts but no intention or plan at this time.  Today is his ex-wife's birthday.  She walked out on him three years ago.  Patient says that he has been depressed for a few weeks.  Patient says that he has no HI or current A/V hallucinations.  Yesterday however he was assessed and was positive for auditory hallucinations.  Patient said that he wanted to go back to [facility], "I liked it there."  Patient was at [facility] at beginning of April.  Patient was at the nurses station asking for ativan.  He was told by nurse [name] that he would have to be seen by psychiatry first.  -Clinician talked to Christopher Sievert, PA about need for TTS.  He recommended patient be seen by psychiatry in AM for evaluation.  Clinician let Christopher Hones, NP know.  Axis I: Generalized Anxiety Disorder and Schizoaffective Disorder Axis II: Deferred Axis III:  Past Medical History  Diagnosis Date  . Diabetes mellitus   . Depression   . Schizo affective schizophrenia   . Polysubstance abuse   . Scoliosis   . Chronic pain   . Noncompliance with medication regimen   . DKA (diabetic ketoacidoses) 11/18/2014  . GERD (gastroesophageal reflux disease)   . Arthritis     "both hips" (12/21/2014)   Axis IV: other psychosocial or environmental problems and problems with primary support group Axis V: 31-40  impairment in reality testing  Past Medical History:  Past Medical History  Diagnosis Date  . Diabetes mellitus   . Depression   . Schizo affective schizophrenia   . Polysubstance abuse   . Scoliosis   . Chronic pain   . Noncompliance with medication regimen   . DKA (diabetic ketoacidoses) 11/18/2014  . GERD (gastroesophageal reflux disease)   . Arthritis     "both hips" (12/21/2014)    History reviewed. No pertinent past surgical history.  Family History:  Family History  Problem Relation Age of Onset  . Diabetes Mother     Social History:  reports that he has been smoking Cigarettes.  He has a 7 pack-year smoking history. He has never used smokeless tobacco. He reports that he drinks alcohol. He reports that he uses illicit drugs (Marijuana).  Additional Social History:  Alcohol / Drug Use Pain Medications: None Prescriptions: No prescription meds for three weeks.  Adderall 20mg  4x/D had been previous medication. Over the Counter: None  CIWA: CIWA-Ar BP: 135/82 mmHg Pulse Rate: 111 COWS:    PATIENT STRENGTHS: (choose at least two) Average or above average intelligence Capable of independent living Communication skills  Allergies:  Allergies  Allergen Reactions  . Sulfa Antibiotics Other (See Comments)    Unknown - Childhood    Home Medications:  (Not in a hospital  admission)  OB/GYN Status:  No LMP for male patient.  General Assessment Data [facility] of Assessment: [facility] TTS Assessment: In system Is this a Tele or Face-to-Face Assessment?: Face-to-Face Is this an Initial Assessment or a Re-assessment for this encounter?: Initial Assessment Marital status: Single Maiden name: N/A Is patient pregnant?: No Living Arrangements: Alone Can pt return to current living arrangement?: Yes Admission Status: Voluntary Is patient capable of signing voluntary admission?: Yes Referral Source: Self/Family/Friend Insurance type: Medicare     Crisis Care  Plan Living Arrangements: Alone Name of Psychiatrist: No provider reported at this time.  Name of Therapist: No  provider reported at this time   Education Status Is patient currently in school?: No Highest grade of school patient has completed: Some college  Risk to self with the past 6 months Suicidal Ideation: Yes-Currently Present Has patient been a risk to self within the past 6 months prior to admission? : Yes (SI primarily in the last month or two.) Suicidal Intent: No Has patient had any suicidal intent within the past 6 months prior to admission? : No Is patient at risk for suicide?: Yes Suicidal Plan?: No Has patient had any suicidal plan within the past 6 months prior to admission? : No Access to Means: No Specify Access to Suicidal Means: No  What has been your use of drugs/alcohol within the last 12 months?: Some ETOH use off and on. Previous Attempts/Gestures: Yes How many times?:  ("Several") Other Self Harm Risks: None Triggers for Past Attempts: Unpredictable Intentional Self Injurious Behavior: None Family Suicide History: No Recent stressful life event(s): Financial Problems, Other (Comment), Divorce (Wife left him 3 years ago, her birthday coming up ) Persecutory voices/beliefs?: No Depression: Yes Depression Symptoms: Despondent, Isolating, Insomnia, Loss of interest in usual pleasures, Feeling worthless/self pity Substance abuse history and/or treatment for substance abuse?: Yes Suicide prevention information given to non-admitted patients: Not applicable  Risk to Others within the past 6 months Homicidal Ideation: No Does patient have any lifetime risk of violence toward others beyond the six months prior to admission? : No Thoughts of Harm to Others: No Current Homicidal Intent: No Current Homicidal Plan: No Access to Homicidal Means: No Identified Victim: No one History of harm to others?: No Assessment of Violence: None Noted Violent Behavior  Description: Pt denies Does patient have access to weapons?: No Criminal Charges Pending?: No Does patient have a court date: No Is patient on probation?: No  Psychosis Hallucinations: None noted Delusions: None noted  Mental Status Report Appearance/Hygiene: Disheveled, In scrubs Eye Contact: Fair Motor Activity: Freedom of movement, Unremarkable Speech: Logical/coherent Level of Consciousness: Quiet/awake Mood: Anxious, Depressed, Helpless, Sad, Despair Affect: Blunted, Depressed, Sad Anxiety Level: Severe Thought Processes: Coherent, Relevant Judgement: Unimpaired Orientation: Person, Place, Situation Obsessive Compulsive Thoughts/Behaviors: None  Cognitive Functioning Concentration: Decreased Memory: Recent Intact, Remote Intact IQ: Average Insight: Poor Impulse Control: Poor Appetite: Good Weight Loss: 0 Weight Gain:  (30 lbs in last few months.) Sleep: Decreased Total Hours of Sleep: 5 Vegetative Symptoms: Staying in bed, Decreased grooming  ADLScreening [facility] Assessment Services) Patient's cognitive ability adequate to safely complete daily activities?: Yes Patient able to express need for assistance with ADLs?: Yes Independently performs ADLs?: Yes (appropriate for developmental age)  Prior Inpatient Therapy Prior Inpatient Therapy: Yes Prior Therapy Dates: 2013, March 31 to april [address]96 Prior Therapy Facilty/Provider(s): [facility]  Reason for Treatment: Schizophrenia   Prior Outpatient Therapy Prior Outpatient Therapy: No Prior Therapy Dates: None  Prior Therapy  Facilty/Provider(s): None  Reason for Treatment: None  Does patient have an ACCT team?: No Does patient have Intensive In-House Services?  : No Does patient have [facility] services? : Unknown Does patient have P4CC services?: Unknown  ADL Screening (condition at time of admission) Patient's cognitive ability adequate to safely complete daily activities?: Yes Is the patient deaf or have difficulty  hearing?: No Does the patient have difficulty seeing, even when wearing glasses/contacts?: No (Pt does say his vision is slightly blurry.) Does the patient have difficulty concentrating, remembering, or making decisions?: No Patient able to express need for assistance with ADLs?: Yes Does the patient have difficulty dressing or bathing?: No Independently performs ADLs?: Yes (appropriate for developmental age) Does the patient have difficulty walking or climbing stairs?: No Weakness of Legs: None Weakness of Arms/Hands: None       Abuse/Neglect Assessment (Assessment to be complete while patient is alone) Physical Abuse: Denies Verbal Abuse: Denies Sexual Abuse: Denies Exploitation of patient/patient's resources: Denies Self-Neglect: Denies     Merchant navy officer (For Healthcare) Does patient have an advance directive?: No Would patient like information on creating an advanced directive?: No - patient declined information    Additional Information 1:1 In Past 12 Months?: No CIRT Risk: No Elopement Risk: No Does patient have medical clearance?: Yes     Disposition:  Disposition Initial Assessment Completed for this Encounter: Yes Disposition of Patient: Inpatient treatment program Type of inpatient treatment program: Adult Other disposition(s): Other (Comment) Patient referred to: Other (Comment) (Pt to be reviewed with PA)  Beatriz Stallion Ray 04/20/2015 3:50 AM

## 2015-04-20 NOTE — BHH Suicide Risk Assessment (Signed)
  [facility] Discharge Suicide Risk Assessment   Demographic Factors:  Male, Caucasian and Living alone  Total Time spent with patient: 25 minutes  Musculoskeletal: Strength & Muscle Tone: within normal limits Gait & Station: normal Patient leans: N/A  Psychiatric Specialty Exam:     Blood pressure 126/78, pulse 101, temperature 97.1 F (36.2 C), temperature source Oral, resp. rate 18, SpO2 98 %.There is no weight on file to calculate BMI.     General Appearance: Casual and Fairly Groomed  Patent attorney:: Good  Speech: Clear and Coherent and Normal Rate  Volume: Normal  Mood: Anxious and Irritable  Affect: Appropriate and Congruent  Thought Process: Coherent and Goal Directed  Orientation: Full (Time, Place, and Person)  Thought Content: WDL  Suicidal Thoughts: No  Homicidal Thoughts: No  Memory: Immediate; Good Recent; Good  Judgement: Fair  Insight: Fair  Psychomotor Activity: Normal  Concentration: Good  Recall: Good  Fund of Knowledge:Fair  Language: Good  Akathisia: No  Handed:   AIMS (if indicated):    Assets: Communication Skills Desire for Improvement Resilience  ADL's: Intact  Cognition: WNL  Sleep:             Has this patient used any form of tobacco in the last 30 days? (Cigarettes, Smokeless Tobacco, Cigars, and/or Pipes) Yes, Prescription not provided because: He only wanted Narcotic anxiolytics.   Mental Status Per Nursing Assessment::   On Admission:     Current Mental Status by Physician: Not suicidal  Loss Factors: Decrease in vocational status  Historical Factors: Impulsivity  Risk Reduction Factors:   Positive social support  Continued Clinical Symptoms:  Depression:   Impulsivity  Cognitive Features That Contribute To Risk:  Closed-mindedness    Suicide Risk:  Minimal: No identifiable suicidal ideation.  Patients presenting with no risk factors but with morbid ruminations; may  be classified as minimal risk based on the severity of the depressive symptoms  Principal Problem: Schizoaffective disorder, by hx Discharge Diagnoses:  Patient Active Problem List   Diagnosis Date Noted  . Depression [F32.9]   . Hip pain [M25.559] 03/29/2015  . Schizoaffective disorder, unspecified type [F25.9] 03/29/2015  . Generalized anxiety disorder [F41.1] 03/29/2015  . Undifferentiated schizophrenia [F20.3]   . Schizophrenia [F20.9] 03/20/2015  . HCAP (healthcare-associated pneumonia) [J18.9] 01/23/2015  . Hyperglycemia [R73.9] 12/21/2014  . DKA (diabetic ketoacidoses) [E13.10] 11/18/2014  . Hip pain, bilateral [M25.551, M25.552] 11/18/2014  . DKA, type 1 [E10.10] 06/17/2012  . Hyponatremia [E87.1] 06/17/2012  . Dehydration [E86.0] 06/17/2012  . Diabetes type 1, uncontrolled [E10.65] 02/19/2012      Plan Of Care/Follow-up recommendations:  Activity:  As tolerated Diet:  heart healthy with low sodium.  Is patient on multiple antipsychotic therapies at discharge:  No   Has Patient had three or more failed trials of antipsychotic monotherapy by history:  No  Recommended Plan for Multiple Antipsychotic Therapies: NA   Ireland Chagnon C, PMHNP-BC 04/20/2015, 10:11 AM

## 2015-04-20 NOTE — ED Notes (Signed)
Patient says that he has suicidal thoughts but no intention or plan at this time. Patient says that he has been depressed for a few weeks. Patient says that he has no HI or current AVH.Patient oriented to unit and plan of care discussed with patient. Patient voices no complaints or concerns at this time. Encouragement and support provided and safety maintain. Q 15 min safety checks in place.

## 2015-04-20 NOTE — ED Notes (Signed)
TTS consult in progress. °

## 2015-04-20 NOTE — Discharge Instructions (Signed)
For your ongoing behavioral health needs you are advised to follow up with [facility].  New patients are seen at their walk-in clinic.  Walk-in hours are Monday - Friday from 8:00 am - 12:00 pm, and from 1:00 pm - 3:00 pm.  Walk-in patients are seen on a first come, first served basis, so try to arrive as early as possible for the best chance of being seen the same day.  There is an initial fee of $22.50: ° °     [facility] °     [address] °     , [place] [address] °     [phone] °

## 2015-04-20 NOTE — Consult Note (Signed)
[place] [facility] Consult   Reason for Consult:  Visit for medication management, anxiety, Hx of Schizophrenia and Schizoaffective disorder, Medication and treatment non compliant Referring Physician:  EDP Patient Identification: [name] MRN:  [id number] Principal Diagnosis: Schizoaffective Disorder by hx Diagnosis:   Patient Active Problem List   Diagnosis Date Noted  . Depression [F32.9]   . Hip pain [M25.559] 03/29/2015  . Schizoaffective disorder, unspecified type [F25.9] 03/29/2015  . Generalized anxiety disorder [F41.1] 03/29/2015  . Undifferentiated schizophrenia [F20.3]   . Schizophrenia [F20.9] 03/20/2015  . HCAP (healthcare-associated pneumonia) [J18.9] 01/23/2015  . Hyperglycemia [R73.9] 12/21/2014  . DKA (diabetic ketoacidoses) [E13.10] 11/18/2014  . Hip pain, bilateral [M25.551, M25.552] 11/18/2014  . DKA, type 1 [E10.10] 06/17/2012  . Hyponatremia [E87.1] 06/17/2012  . Dehydration [E86.0] 06/17/2012  . Diabetes type 1, uncontrolled [E10.65] 02/19/2012    Total Time spent with patient: 45 minutes   HPI:  Caucasian male, 29 years old was discharged yesterday after evaluation for symptoms of Bipolar disorder and medication non compliant.   He came back to day asking for strong medication for anxiety but did not want Vistaril. He also stated that he did not want to go to [facility] as directed because he does not like their medication management protocol.  Patient stated that he wanted to be admitted for medication management.  He denies SI/HI/AVH.   He reported poor sleep and good appetite.  Patient finally was referred to [facility] Since he refused to go to [facility].  Patient is discharged.  HPI Elements:   Location:  Schizoaffective disorder, , by hx , Schizophrenia by hx. Quality:  Moderate-mild, medication and treatment non compliant. Severity:  Moderate-mild. Timing:  Acute. Duration:  Chronic mental illness. Context:   Came back  after discharge the previous day seeking Medication management in the ER.  Past Medical History:  Past Medical History  Diagnosis Date  . Diabetes mellitus   . Depression   . Schizo affective schizophrenia   . Polysubstance abuse   . Scoliosis   . Chronic pain   . Noncompliance with medication regimen   . DKA (diabetic ketoacidoses) 11/18/2014  . GERD (gastroesophageal reflux disease)   . Arthritis     "both hips" (12/21/2014)   History reviewed. No pertinent past surgical history. Family History:  Family History  Problem Relation Age of Onset  . Diabetes Mother    Social History:  History  Alcohol Use  . Yes    Comment: 12/21/2014 "I drink on a rare occasion"     History  Drug Use  . Yes  . Special: Marijuana    Comment: 12/21/2014 "I use marijuana on a rare occasion", last used marijuana 01/30/2015    History   Social History  . Marital Status: Married    Spouse Name: N/A  . Number of Children: N/A  . Years of Education: N/A   Social History Main Topics  . Smoking status: Current Some Day Smoker -- 1.00 packs/day for 7 years    Types: Cigarettes  . Smokeless tobacco: Never Used  . Alcohol Use: Yes     Comment: 12/21/2014 "I drink on a rare occasion"  . Drug Use: Yes    Special: Marijuana     Comment: 12/21/2014 "I use marijuana on a rare occasion", last used marijuana 01/30/2015  . Sexual Activity: Yes   Other Topics Concern  . None   Social History Narrative   ** Merged History Encounter **  Additional Social History:    Pain Medications: None Prescriptions: No prescription meds for three weeks.  Adderall $RemoveB'20mg'OwGPOAAz$  4x/D had been previous medication. Over the Counter: None                     Allergies:   Allergies  Allergen Reactions  . Sulfa Antibiotics Other (See Comments)    Unknown - Childhood    Labs:  Results for orders placed or performed during the hospital encounter of 04/19/15 (from the past 48 hour(s))  Urine Drug Screen      Status: None   Collection Time: 04/19/15  9:01 PM  Result Value Ref Range   Opiates NONE DETECTED NONE DETECTED   Cocaine NONE DETECTED NONE DETECTED   Benzodiazepines NONE DETECTED NONE DETECTED   Amphetamines NONE DETECTED NONE DETECTED   Tetrahydrocannabinol NONE DETECTED NONE DETECTED    Comment: DELTA CHECK NOTED   Barbiturates NONE DETECTED NONE DETECTED    Comment:        DRUG SCREEN FOR MEDICAL PURPOSES ONLY.  IF CONFIRMATION IS NEEDED FOR ANY PURPOSE, NOTIFY LAB WITHIN 5 DAYS.        LOWEST DETECTABLE LIMITS FOR URINE DRUG SCREEN Drug Class       Cutoff (ng/mL) Amphetamine      1000 Barbiturate      200 Benzodiazepine   751 Tricyclics       025 Opiates          300 Cocaine          300 THC              50   CBC     Status: None   Collection Time: 04/19/15  9:33 PM  Result Value Ref Range   WBC 7.0 4.0 - 10.5 K/uL   RBC 4.58 4.22 - 5.81 MIL/uL   Hemoglobin 13.9 13.0 - 17.0 g/dL   HCT 40.4 39.0 - 52.0 %   MCV 88.2 78.0 - 100.0 fL   MCH 30.3 26.0 - 34.0 pg   MCHC 34.4 30.0 - 36.0 g/dL   RDW 13.8 11.5 - 15.5 %   Platelets 272 150 - 400 K/uL  Comprehensive metabolic panel     Status: Abnormal   Collection Time: 04/19/15  9:33 PM  Result Value Ref Range   Sodium 138 135 - 145 mmol/L   Potassium 4.3 3.5 - 5.1 mmol/L   Chloride 98 (L) 101 - 111 mmol/L   CO2 30 22 - 32 mmol/L   Glucose, Bld 409 (H) 70 - 99 mg/dL   BUN 19 6 - 20 mg/dL   Creatinine, Ser 0.63 0.61 - 1.24 mg/dL   Calcium 9.9 8.9 - 10.3 mg/dL   Total Protein 6.9 6.5 - 8.1 g/dL   Albumin 4.5 3.5 - 5.0 g/dL   AST 18 15 - 41 U/L   ALT 19 17 - 63 U/L   Alkaline Phosphatase 69 38 - 126 U/L   Total Bilirubin 0.6 0.3 - 1.2 mg/dL   GFR calc non Af Amer >60 >60 mL/min   GFR calc Af Amer >60 >60 mL/min    Comment: (NOTE) The eGFR has been calculated using the CKD EPI equation. This calculation has not been validated in all clinical situations. eGFR's persistently <90 mL/min signify possible Chronic  Kidney Disease.    Anion gap 10 5 - 15  Acetaminophen level     Status: Abnormal   Collection Time: 04/19/15  9:34 PM  Result Value Ref  Range   Acetaminophen (Tylenol), Serum <10 (L) 10 - 30 ug/mL    Comment:        THERAPEUTIC CONCENTRATIONS VARY SIGNIFICANTLY. A RANGE OF 10-30 ug/mL MAY BE AN EFFECTIVE CONCENTRATION FOR MANY PATIENTS. HOWEVER, SOME ARE BEST TREATED AT CONCENTRATIONS OUTSIDE THIS RANGE. ACETAMINOPHEN CONCENTRATIONS >150 ug/mL AT 4 HOURS AFTER INGESTION AND >50 ug/mL AT 12 HOURS AFTER INGESTION ARE OFTEN ASSOCIATED WITH TOXIC REACTIONS.   Ethanol (ETOH)     Status: None   Collection Time: 04/19/15  9:34 PM  Result Value Ref Range   Alcohol, Ethyl (B) <5 <5 mg/dL    Comment:        LOWEST DETECTABLE LIMIT FOR SERUM ALCOHOL IS 11 mg/dL FOR MEDICAL PURPOSES ONLY   Salicylate level     Status: None   Collection Time: 04/19/15  9:34 PM  Result Value Ref Range   Salicylate Lvl <3.0 2.8 - 30.0 mg/dL  CBG monitoring, ED     Status: Abnormal   Collection Time: 04/20/15  3:25 AM  Result Value Ref Range   Glucose-Capillary 314 (H) 70 - 99 mg/dL   Comment 1 Document in Chart   CBG monitoring, ED     Status: Abnormal   Collection Time: 04/20/15  7:49 AM  Result Value Ref Range   Glucose-Capillary 417 (H) 70 - 99 mg/dL    Vitals: Blood pressure 126/78, pulse 101, temperature 97.1 F (36.2 C), temperature source Oral, resp. rate 18, SpO2 98 %.  Risk to Self: Suicidal Ideation: Yes-Currently Present Suicidal Intent: No Is patient at risk for suicide?: Yes Suicidal Plan?: No Access to Means: No Specify Access to Suicidal Means: No  What has been your use of drugs/alcohol within the last 12 months?: Some ETOH use off and on. How many times?:  ("Several") Other Self Harm Risks: None Triggers for Past Attempts: Unpredictable Intentional Self Injurious Behavior: None Risk to Others: Homicidal Ideation: No Thoughts of Harm to Others: No Current Homicidal  Intent: No Current Homicidal Plan: No Access to Homicidal Means: No Identified Victim: No one History of harm to others?: No Assessment of Violence: None Noted Violent Behavior Description: Pt denies Does patient have access to weapons?: No Criminal Charges Pending?: No Does patient have a court date: No Prior Inpatient Therapy: Prior Inpatient Therapy: Yes Prior Therapy Dates: 2013, March 31 to april 82016 Prior Therapy Facilty/Provider(s): [facility]  Reason for Treatment: Schizophrenia  Prior Outpatient Therapy: Prior Outpatient Therapy: No Prior Therapy Dates: None  Prior Therapy Facilty/Provider(s): None  Reason for Treatment: None  Does patient have an ACCT team?: No Does patient have Intensive In-House Services?  : No Does patient have [facility] services? : Unknown Does patient have [facility] services?: Unknown  No current facility-administered medications for this encounter.   Current Outpatient Prescriptions  Medication Sig Dispense Refill  . acetaminophen-codeine (TYLENOL #3) 300-30 MG per tablet Take 1 tablet by mouth 4 (four) times daily as needed for moderate pain.    [name] amphetamine-dextroamphetamine (ADDERALL) 20 MG tablet Take 20 mg by mouth 2 (two) times daily.  0  . glucose blood (FREESTYLE LITE) test strip Use as instructed 100 each 0  . glucose monitoring kit (FREESTYLE) monitoring kit 1 each by Does not apply route as needed for other. Test blood sugars three daily and when needed: For blood sugar checks 1 each 1  . insulin aspart protamine- aspart (NOVOLOG MIX 70/30) (70-30) 100 UNIT/ML injection Inject 0.4 mLs (40 Units total) into the skin 2 (two) times  daily with a meal. For diabetes management 30 mL 3  . Lancets (FREESTYLE) lancets Use as instructed: For blood sugar checks 100 each 3  . traZODone (DESYREL) 100 MG tablet Take 2 tablets (200 mg total) by mouth at bedtime. 28 tablet 0  . benztropine (COGENTIN) 0.5 MG tablet Take 1 tablet (0.5 mg total) by mouth 2 (two) times  daily. For prevention of drug induced tremors (Patient not taking: Reported on 04/19/2015) 60 tablet 3  . divalproex (DEPAKOTE) 500 MG DR tablet Take 1 Tablet (500 mg) daily & 1.5 tablets (750 mg) at bedtime: For mood stabilization (Patient not taking: Reported on 04/19/2015) 75 tablet 3  . gabapentin (NEURONTIN) 300 MG capsule Take 1 capsule (300 mg total) by mouth 3 (three) times daily at 8am, 3pm and bedtime. For agitation (Patient not taking: Reported on 04/19/2015) 90 capsule 3  . hydrOXYzine (ATARAX/VISTARIL) 25 MG tablet Take 1 tablet (25 mg total) by mouth 3 (three) times daily as needed for anxiety. (Patient not taking: Reported on 04/19/2015) 60 tablet 3  . insulin aspart (NOVOLOG) 100 UNIT/ML injection Inject 0-9 Units into the skin 3 (three) times daily with meals. CBG < 70: Eat or drink something sweet and recheck. CBG 70 - 120: 0 units CBG 121 - 150: 1 unit CBG 151 - 200: 2 units: Blood sugar sliding scale insulin coverage. CBG 201 - 250: 3 units CBG 251 - 300: 5 units CBG 301 - 350: 7 units CBG 351 - 400: 9 units CBG > 400: call MD. (Patient not taking: Reported on 04/19/2015) 10 mL 11  . risperiDONE (RISPERDAL M-TABS) 2 MG disintegrating tablet Take 1 tablet (2 mg) daily & 2 tablets (4 mg) at bedtime: For mood control (Patient not taking: Reported on 04/19/2015) 90 tablet 3  . [DISCONTINUED] citalopram (CELEXA) 20 MG tablet Take 20 mg by mouth daily.    . [DISCONTINUED] FLUoxetine (PROZAC) 20 MG tablet Take 20 mg by mouth every morning.    . [DISCONTINUED] insulin glargine (LANTUS) 100 UNIT/ML injection Inject 30 Units into the skin at bedtime. 10 mL 3  . [DISCONTINUED] insulin lispro (HUMALOG) 100 UNIT/ML injection Inject 10 Units into the skin 3 (three) times daily before meals.      . [DISCONTINUED] OLANZapine zydis (ZYPREXA) 10 MG disintegrating tablet Take 10 mg by mouth 2 (two) times daily.    . [DISCONTINUED] sertraline (ZOLOFT) 50 MG tablet Take 50 mg by mouth daily. For depression.  Just started med, have not picked up yet [facility] [address]     ROS : Unable to perform ROS due to patient refusal to answer questions and cooperate. Musculoskeletal: Strength & Muscle Tone: within normal limits Gait & Station: normal Patient leans: N/A  Psychiatric Specialty Exam:     Blood pressure 126/78, pulse 101, temperature 97.1 F (36.2 C), temperature source Oral, resp. rate 18, SpO2 98 %.There is no weight on file to calculate BMI.  General Appearance: Casual and Fairly Groomed  Engineer, water::  Good  Speech:  Clear and Coherent and Normal Rate  Volume:  Normal  Mood:  Anxious and Irritable  Affect:  Appropriate and Congruent  Thought Process:  Coherent and Goal Directed  Orientation:  Full (Time, Place, and Person)  Thought Content:  WDL  Suicidal Thoughts:  No  Homicidal Thoughts:  No  Memory:  Immediate;   Good Recent;   Good  Judgement:  Fair  Insight:  Fair  Psychomotor Activity:  Normal  Concentration:  Good  Recall:  Roel Cluck of Knowledge:Fair  Language: Good  Akathisia:  No  Handed:    AIMS (if indicated):     Assets:  Communication Skills Desire for Improvement Resilience  ADL's:  Intact  Cognition: WNL  Sleep:      Medical Decision Making: Established Problem, Stable/Improving (1)  Treatment Plan Summary: See below  Plan:  No evidence of imminent risk to self or others at present.   Patient does not meet criteria for psychiatric inpatient admission. Supportive therapy provided about ongoing stressors. Refer to IOP. Discussed crisis plan, support from social network, calling 911, coming to the Emergency Department, and calling Suicide Hotline.  Disposition: Discharge home to follow up with [facility] [place] [facility] for medication management.  We encourage an outpatient provider to change medications and follow up with patient as needed.  He did not meet criteria for admission.   Delfin Gant, PMHNP-BC 04/20/2015 10:12  AM Patient seen face-to-face for psychiatric evaluation, chart reviewed and case discussed with the physician extender and developed treatment plan. Reviewed the information documented and agree with the treatment plan. Corena Pilgrim, MD

## 2015-04-20 NOTE — ED Notes (Signed)
Patient informed of CBG reading. Writer gave patient 2 packs of crackers and a cheese stick. Patient informed that Berna Spare from [facility] was coming to do his assessment. Patient voices no complaints or concerns at this time. Q 15 min safety checks remain in place.

## 2015-04-20 NOTE — ED Notes (Signed)
Patient request a sandwich. Patient given a sandwich at this time.

## 2015-05-11 ENCOUNTER — Ambulatory Visit: Payer: Self-pay | Admitting: Family Medicine

## 2015-05-11 ENCOUNTER — Ambulatory Visit: Payer: Self-pay | Admitting: Family

## 2015-05-13 ENCOUNTER — Emergency Department (HOSPITAL_COMMUNITY)
Admission: EM | Admit: 2015-05-13 | Discharge: 2015-05-13 | Disposition: A | Discharge status: Home / Self Care | Payer: PPO | Attending: Emergency Medicine | Admitting: Emergency Medicine

## 2015-05-13 ENCOUNTER — Encounter (HOSPITAL_COMMUNITY): Payer: Self-pay | Admitting: Emergency Medicine

## 2015-05-13 DIAGNOSIS — F329 Major depressive disorder, single episode, unspecified: Secondary | ICD-10-CM | POA: Diagnosis not present

## 2015-05-13 DIAGNOSIS — Z720 Tobacco use: Secondary | ICD-10-CM | POA: Insufficient documentation

## 2015-05-13 DIAGNOSIS — E1165 Type 2 diabetes mellitus with hyperglycemia: Secondary | ICD-10-CM | POA: Diagnosis not present

## 2015-05-13 DIAGNOSIS — Z9119 Patient's noncompliance with other medical treatment and regimen: Secondary | ICD-10-CM | POA: Insufficient documentation

## 2015-05-13 DIAGNOSIS — Z794 Long term (current) use of insulin: Secondary | ICD-10-CM | POA: Insufficient documentation

## 2015-05-13 DIAGNOSIS — G8929 Other chronic pain: Secondary | ICD-10-CM | POA: Diagnosis not present

## 2015-05-13 DIAGNOSIS — Z8719 Personal history of other diseases of the digestive system: Secondary | ICD-10-CM | POA: Diagnosis not present

## 2015-05-13 DIAGNOSIS — M1990 Unspecified osteoarthritis, unspecified site: Secondary | ICD-10-CM | POA: Insufficient documentation

## 2015-05-13 DIAGNOSIS — Z79899 Other long term (current) drug therapy: Secondary | ICD-10-CM | POA: Insufficient documentation

## 2015-05-13 DIAGNOSIS — R739 Hyperglycemia, unspecified: Secondary | ICD-10-CM

## 2015-05-13 LAB — CBG MONITORING, ED: Glucose-Capillary: 179 mg/dL — ABNORMAL HIGH (ref 65–99)

## 2015-05-13 LAB — URINALYSIS, ROUTINE W REFLEX MICROSCOPIC
BILIRUBIN URINE: NEGATIVE
Glucose, UA: >1000 mg/dL — AB
HGB URINE DIPSTICK: NEGATIVE
Ketones, ur: 40 mg/dL — AB
Leukocytes, UA: NEGATIVE
Nitrite: NEGATIVE
Protein, ur: NEGATIVE mg/dL
Specific Gravity, Urine: 1.046 — ABNORMAL HIGH (ref 1.005–1.030)
Urobilinogen, UA: 1 mg/dL (ref 0.0–1.0)
pH: 6 (ref 5.0–8.0)

## 2015-05-13 LAB — CBC WITH DIFFERENTIAL/PLATELET
BASOS PCT: 1 % (ref 0–1)
Basophils Absolute: 0.1 10*3/uL (ref 0.0–0.1)
EOS ABS: 0.1 10*3/uL (ref 0.0–0.7)
Eosinophils Relative: 1 % (ref 0–5)
HEMATOCRIT: 47.2 % (ref 39.0–52.0)
Hemoglobin: 17.1 g/dL — ABNORMAL HIGH (ref 13.0–17.0)
Lymphocytes Relative: 16 % (ref 12–46)
Lymphs Abs: 1.5 10*3/uL (ref 0.7–4.0)
MCH: 31 pg (ref 26.0–34.0)
MCHC: 36.2 g/dL — ABNORMAL HIGH (ref 30.0–36.0)
MCV: 85.5 fL (ref 78.0–100.0)
MONOS PCT: 7 % (ref 3–12)
Monocytes Absolute: 0.7 10*3/uL (ref 0.1–1.0)
NEUTROS PCT: 75 % (ref 43–77)
Neutro Abs: 7.2 10*3/uL (ref 1.7–7.7)
Platelets: 302 10*3/uL (ref 150–400)
RBC: 5.52 MIL/uL (ref 4.22–5.81)
RDW: 12.5 % (ref 11.5–15.5)
WBC: 9.5 10*3/uL (ref 4.0–10.5)

## 2015-05-13 LAB — URINE MICROSCOPIC-ADD ON

## 2015-05-13 LAB — COMPREHENSIVE METABOLIC PANEL WITH GFR
ALT: 16 U/L — ABNORMAL LOW (ref 17–63)
AST: 21 U/L (ref 15–41)
Albumin: 4.5 g/dL (ref 3.5–5.0)
Alkaline Phosphatase: 87 U/L (ref 38–126)
Anion gap: 13 (ref 5–15)
BUN: 17 mg/dL (ref 6–20)
CO2: 27 mmol/L (ref 22–32)
Calcium: 9.8 mg/dL (ref 8.9–10.3)
Chloride: 91 mmol/L — ABNORMAL LOW (ref 101–111)
Creatinine, Ser: 0.83 mg/dL (ref 0.61–1.24)
GFR calc Af Amer: >60 mL/min
GFR calc non Af Amer: >60 mL/min
Glucose, Bld: 582 mg/dL (ref 65–99)
Potassium: 4.8 mmol/L (ref 3.5–5.1)
Sodium: 131 mmol/L — ABNORMAL LOW (ref 135–145)
Total Bilirubin: 0.9 mg/dL (ref 0.3–1.2)
Total Protein: 7.4 g/dL (ref 6.5–8.1)

## 2015-05-13 LAB — I-STAT VENOUS BLOOD GAS, ED
ACID-BASE EXCESS: 5 mmol/L — AB (ref 0.0–2.0)
Bicarbonate: 31.9 mEq/L — ABNORMAL HIGH (ref 20.0–24.0)
O2 Saturation: 41 %
TCO2: 34 mmol/L (ref 0–100)
pCO2, Ven: 55.4 mmHg — ABNORMAL HIGH (ref 45.0–50.0)
pH, Ven: 7.368 — ABNORMAL HIGH (ref 7.250–7.300)
pO2, Ven: 25 mmHg — CL (ref 30.0–45.0)

## 2015-05-13 MED ORDER — INSULIN ASPART 100 UNIT/ML ~~LOC~~ SOLN
14.0000 [IU] | Freq: Once | SUBCUTANEOUS | Status: AC
  Administered 2015-05-13: 14 [IU] via INTRAVENOUS
  Filled 2015-05-13: qty 1

## 2015-05-13 MED ORDER — SODIUM CHLORIDE 0.9 % IV BOLUS (SEPSIS)
1000.0000 mL | Freq: Once | INTRAVENOUS | Status: AC
  Administered 2015-05-13: 1000 mL via INTRAVENOUS

## 2015-05-13 MED ORDER — INSULIN ASPART PROT & ASPART (70-30 MIX) 100 UNIT/ML ~~LOC~~ SUSP: 40.0000 [IU] | Freq: Two times a day (BID) | SUBCUTANEOUS | Status: DC

## 2015-05-13 NOTE — ED Notes (Signed)
CBG 582 Reported to MD

## 2015-05-13 NOTE — ED Notes (Signed)
NAD at this time. Pt is stable and going home.  

## 2015-05-13 NOTE — ED Notes (Signed)
Lab results given to [facility].

## 2015-05-13 NOTE — ED Provider Notes (Signed)
CSN: [id number]     Arrival date & time 05/13/15  1120 History   First MD Initiated Contact with Patient 05/13/15 1156     Chief Complaint  Patient presents with  . Hyperglycemia     (Consider location/radiation/quality/duration/timing/severity/associated sxs/prior Treatment) The history is provided by the patient and medical records.   This is a 29 y.o. M with PMH significant for depression, schizoaffective disorder, polysubstance abuse, diabetes, presenting to the ED for hyperglycemia.  Patient states he has been out of his insulin for the past 2 weeks. He states some "chicken juice" spilled on his insulin bottles in her freezer so he just decided to throw them out. He states for the past 12 days he has had polyuria, polydipsia, and nausea. He denies any vomiting, diarrhea, or abdominal pain. No chest pain or shortness of breath. He denies any fever or chills.  VSS.  Past Medical History  Diagnosis Date  . Diabetes mellitus   . Depression   . Schizo affective schizophrenia   . Polysubstance abuse   . Scoliosis   . Chronic pain   . Noncompliance with medication regimen   . DKA (diabetic ketoacidoses) 11/18/2014  . GERD (gastroesophageal reflux disease)   . Arthritis     "both hips" (12/21/2014)   Past Surgical History  Procedure Laterality Date  . Hand surgery Left    Family History  Problem Relation Age of Onset  . Diabetes Mother    History  Substance Use Topics  . Smoking status: Current Some Day Smoker -- 1.00 packs/day for 7 years    Types: Cigarettes  . Smokeless tobacco: Never Used  . Alcohol Use: No     Comment: 12/21/2014 "I drink on a rare occasion"    Review of Systems  Endocrine: Positive for polydipsia and polyuria.  All other systems reviewed and are negative.     Allergies  Sulfa antibiotics  Home Medications   Prior to Admission medications   Medication Sig Start Date End Date Taking? Authorizing Provider  acetaminophen-codeine (TYLENOL #3)  300-30 MG per tablet Take 1 tablet by mouth 4 (four) times daily as needed for moderate pain.    Historical Provider, MD  amphetamine-dextroamphetamine (ADDERALL) 20 MG tablet Take 20 mg by mouth 2 (two) times daily. 02/25/15   Historical Provider, MD  benztropine (COGENTIN) 0.5 MG tablet Take 1 tablet (0.5 mg total) by mouth 2 (two) times daily. For prevention of drug induced tremors Patient not taking: Reported on 04/19/2015 04/19/15   Benjamine Mola, FNP  divalproex (DEPAKOTE) 500 MG DR tablet Take 1 Tablet (500 mg) daily & 1.5 tablets (750 mg) at bedtime: For mood stabilization Patient not taking: Reported on 04/19/2015 03/29/15   Tresa Garter, MD  gabapentin (NEURONTIN) 300 MG capsule Take 1 capsule (300 mg total) by mouth 3 (three) times daily at 8am, 3pm and bedtime. For agitation Patient not taking: Reported on 04/19/2015 03/29/15   Tresa Garter, MD  glucose blood (FREESTYLE LITE) test strip Use as instructed 03/29/15   Tresa Garter, MD  glucose monitoring kit (FREESTYLE) monitoring kit 1 each by Does not apply route as needed for other. Test blood sugars three daily and when needed: For blood sugar checks 03/29/15   Tresa Garter, MD  hydrOXYzine (ATARAX/VISTARIL) 25 MG tablet Take 1 tablet (25 mg total) by mouth 3 (three) times daily as needed for anxiety. Patient not taking: Reported on 04/19/2015 03/29/15   Tresa Garter, MD  insulin aspart (NOVOLOG)  100 UNIT/ML injection Inject 0-9 Units into the skin 3 (three) times daily with meals. CBG < 70: Eat or drink something sweet and recheck. CBG 70 - 120: 0 units CBG 121 - 150: 1 unit CBG 151 - 200: 2 units: Blood sugar sliding scale insulin coverage. CBG 201 - 250: 3 units CBG 251 - 300: 5 units CBG 301 - 350: 7 units CBG 351 - 400: 9 units CBG > 400: call MD. Patient not taking: Reported on 04/19/2015 04/01/15   Delfin Gant, NP  insulin aspart protamine- aspart (NOVOLOG MIX 70/30) (70-30) 100 UNIT/ML injection  Inject 0.4 mLs (40 Units total) into the skin 2 (two) times daily with a meal. For diabetes management 03/29/15   Tresa Garter, MD  Lancets (FREESTYLE) lancets Use as instructed: For blood sugar checks 03/29/15   Tresa Garter, MD  risperiDONE (RISPERDAL M-TABS) 2 MG disintegrating tablet Take 1 tablet (2 mg) daily & 2 tablets (4 mg) at bedtime: For mood control Patient not taking: Reported on 04/19/2015 03/29/15   Tresa Garter, MD  traZODone (DESYREL) 100 MG tablet Take 2 tablets (200 mg total) by mouth at bedtime. 04/19/15   Elyse Jarvis Withrow, FNP   BP 135/86 mmHg  Pulse 100  Temp(Src) 98.1 F (36.7 C) (Oral)  Resp 16  SpO2 96%   Physical Exam  Constitutional: He is oriented to person, place, and time. He appears well-developed and well-nourished. No distress.  HENT:  Head: Normocephalic and atraumatic.  Mouth/Throat: Oropharynx is clear and moist.  Eyes: Conjunctivae and EOM are normal. Pupils are equal, round, and reactive to light.  Neck: Normal range of motion. Neck supple.  Cardiovascular: Normal rate, regular rhythm and normal heart sounds.   Pulmonary/Chest: Effort normal and breath sounds normal. No respiratory distress. He has no wheezes.  Abdominal: Soft. Bowel sounds are normal. There is no tenderness. There is no guarding.  Musculoskeletal: Normal range of motion. He exhibits no edema.  Neurological: He is alert and oriented to person, place, and time.  Skin: Skin is warm and dry. He is not diaphoretic.  Psychiatric: He has a normal mood and affect.  Nursing note and vitals reviewed.   ED Course  Procedures (including critical care time) Labs Review Labs Reviewed  CBC WITH DIFFERENTIAL/PLATELET - Abnormal; Notable for the following:    Hemoglobin 17.1 (*)    MCHC 36.2 (*)    All other components within normal limits  URINALYSIS, ROUTINE W REFLEX MICROSCOPIC (NOT AT [facility]) - Abnormal; Notable for the following:    Specific Gravity, Urine 1.046 (*)     Glucose, UA >1000 (*)    Ketones, ur 40 (*)    All other components within normal limits  COMPREHENSIVE METABOLIC PANEL - Abnormal; Notable for the following:    Sodium 131 (*)    Chloride 91 (*)    Glucose, Bld 582 (*)    ALT 16 (*)    All other components within normal limits  CBG MONITORING, ED - Abnormal; Notable for the following:    Glucose-Capillary >600 (*)    All other components within normal limits  I-STAT VENOUS BLOOD GAS, ED - Abnormal; Notable for the following:    pH, Ven 7.368 (*)    pCO2, Ven 55.4 (*)    pO2, Ven 25.0 (*)    Bicarbonate 31.9 (*)    Acid-Base Excess 5.0 (*)    All other components within normal limits  CBG MONITORING, ED - Abnormal; Notable for  the following:    Glucose-Capillary 179 (*)    All other components within normal limits  URINE MICROSCOPIC-ADD ON    Imaging Review No results found.   EKG Interpretation None      MDM   Final diagnoses:  Hyperglycemia   29 y.o. M here with hyperglycemia.  He has been out of his home insulin for the past 2 weeks.  He reports polyuria and polydipsia. He denies any chest pain, shortness of breath, vomiting, or abdominal pain. Initial CBG >600.  Lab work pending.  IVF bolus given.  Labwork as above, CBG 582 with mild electrolyte imbalance. Anion gap and bicarbonate remains within normal limits. Venous blood gas is reassuring. UA with 40 ketones but overall noninfectious. Patient was aggressively fluid resuscitated and given IV insulin with improvement of his glucose to 147. We'll restart his regular home insulin. He has previously scheduled follow-up with new PCP on 05/26/2015, strongly encouraged to keep this appointment.  Discussed plan with patient, he/she acknowledged understanding and agreed with plan of care.  Return precautions given for new or worsening symptoms.  Larene Pickett, PA-C 05/13/15 1618  Sherwood Gambler, MD 05/15/15 [phone]

## 2015-05-13 NOTE — Discharge Instructions (Signed)
Resume taking your insulin as prescribed. Make sure to keep close check on your blood sugar-- come back in if uncontrolled. Follow-up with your doctor on 6/8 as scheduled. Return to the ED for new or worsening symptoms.

## 2015-05-13 NOTE — ED Notes (Signed)
CBG 147 

## 2015-05-13 NOTE — ED Notes (Signed)
Pt states has been out of insulin for 2 weeks-- feels like sugar has been high for 12 days-- now unable to keep anything down, having polydipsia--

## 2015-05-17 ENCOUNTER — Emergency Department (HOSPITAL_COMMUNITY)
Admission: EM | Admit: 2015-05-17 | Discharge: 2015-05-18 | Disposition: A | Discharge status: Home / Self Care | Payer: PPO | Attending: Emergency Medicine | Admitting: Emergency Medicine

## 2015-05-17 ENCOUNTER — Encounter (HOSPITAL_COMMUNITY): Payer: Self-pay | Admitting: *Deleted

## 2015-05-17 DIAGNOSIS — E1065 Type 1 diabetes mellitus with hyperglycemia: Secondary | ICD-10-CM

## 2015-05-17 DIAGNOSIS — M1990 Unspecified osteoarthritis, unspecified site: Secondary | ICD-10-CM | POA: Diagnosis not present

## 2015-05-17 DIAGNOSIS — Z8719 Personal history of other diseases of the digestive system: Secondary | ICD-10-CM | POA: Diagnosis not present

## 2015-05-17 DIAGNOSIS — F209 Schizophrenia, unspecified: Secondary | ICD-10-CM | POA: Insufficient documentation

## 2015-05-17 DIAGNOSIS — G8929 Other chronic pain: Secondary | ICD-10-CM | POA: Diagnosis not present

## 2015-05-17 DIAGNOSIS — K029 Dental caries, unspecified: Secondary | ICD-10-CM | POA: Insufficient documentation

## 2015-05-17 DIAGNOSIS — R739 Hyperglycemia, unspecified: Secondary | ICD-10-CM

## 2015-05-17 DIAGNOSIS — M419 Scoliosis, unspecified: Secondary | ICD-10-CM | POA: Insufficient documentation

## 2015-05-17 DIAGNOSIS — E1165 Type 2 diabetes mellitus with hyperglycemia: Secondary | ICD-10-CM | POA: Diagnosis not present

## 2015-05-17 DIAGNOSIS — K0889 Other specified disorders of teeth and supporting structures: Secondary | ICD-10-CM

## 2015-05-17 DIAGNOSIS — F329 Major depressive disorder, single episode, unspecified: Secondary | ICD-10-CM | POA: Insufficient documentation

## 2015-05-17 DIAGNOSIS — Z720 Tobacco use: Secondary | ICD-10-CM | POA: Diagnosis not present

## 2015-05-17 DIAGNOSIS — R000 Tachycardia, unspecified: Secondary | ICD-10-CM | POA: Diagnosis not present

## 2015-05-17 DIAGNOSIS — K088 Other specified disorders of teeth and supporting structures: Secondary | ICD-10-CM | POA: Insufficient documentation

## 2015-05-17 DIAGNOSIS — Z79899 Other long term (current) drug therapy: Secondary | ICD-10-CM | POA: Insufficient documentation

## 2015-05-17 DIAGNOSIS — IMO0002 Reserved for concepts with insufficient information to code with codable children: Secondary | ICD-10-CM

## 2015-05-17 LAB — CBC WITH DIFFERENTIAL/PLATELET
Basophils Absolute: 0.1 10*3/uL (ref 0.0–0.1)
Basophils Relative: 1 % (ref 0–1)
EOS PCT: 2 % (ref 0–5)
Eosinophils Absolute: 0.1 10*3/uL (ref 0.0–0.7)
HCT: 41.9 % (ref 39.0–52.0)
HEMOGLOBIN: 14.9 g/dL (ref 13.0–17.0)
LYMPHS PCT: 19 % (ref 12–46)
Lymphs Abs: 1.4 10*3/uL (ref 0.7–4.0)
MCH: 30.2 pg (ref 26.0–34.0)
MCHC: 35.6 g/dL (ref 30.0–36.0)
MCV: 84.8 fL (ref 78.0–100.0)
MONO ABS: 0.4 10*3/uL (ref 0.1–1.0)
Monocytes Relative: 5 % (ref 3–12)
NEUTROS ABS: 5.5 10*3/uL (ref 1.7–7.7)
Neutrophils Relative %: 73 % (ref 43–77)
PLATELETS: 298 10*3/uL (ref 150–400)
RBC: 4.94 MIL/uL (ref 4.22–5.81)
RDW: 12.4 % (ref 11.5–15.5)
WBC: 7.4 10*3/uL (ref 4.0–10.5)

## 2015-05-17 LAB — URINE MICROSCOPIC-ADD ON

## 2015-05-17 LAB — I-STAT CHEM 8, ED
BUN: 12 mg/dL (ref 6–20)
CALCIUM ION: 1.18 mmol/L (ref 1.12–1.23)
CREATININE: 0.6 mg/dL — AB (ref 0.61–1.24)
Chloride: 99 mmol/L — ABNORMAL LOW (ref 101–111)
Glucose, Bld: 407 mg/dL — ABNORMAL HIGH (ref 65–99)
HEMATOCRIT: 39 % (ref 39.0–52.0)
HEMOGLOBIN: 13.3 g/dL (ref 13.0–17.0)
POTASSIUM: 3.9 mmol/L (ref 3.5–5.1)
Sodium: 134 mmol/L — ABNORMAL LOW (ref 135–145)
TCO2: 20 mmol/L (ref 0–100)

## 2015-05-17 LAB — URINALYSIS, ROUTINE W REFLEX MICROSCOPIC
Bilirubin Urine: NEGATIVE
Hgb urine dipstick: NEGATIVE
KETONES UR: 40 mg/dL — AB
LEUKOCYTES UA: NEGATIVE
Nitrite: NEGATIVE
PROTEIN: NEGATIVE mg/dL
SPECIFIC GRAVITY, URINE: 1.04 — AB (ref 1.005–1.030)
UROBILINOGEN UA: 0.2 mg/dL (ref 0.0–1.0)
pH: 6 (ref 5.0–8.0)

## 2015-05-17 LAB — BASIC METABOLIC PANEL
Anion gap: 9 (ref 5–15)
BUN: 11 mg/dL (ref 6–20)
CO2: 24 mmol/L (ref 22–32)
CREATININE: 1.03 mg/dL (ref 0.61–1.24)
Calcium: 9.3 mg/dL (ref 8.9–10.3)
Chloride: 96 mmol/L — ABNORMAL LOW (ref 101–111)
GFR calc non Af Amer: >60 mL/min (ref >60)
Glucose, Bld: 746 mg/dL (ref 65–99)
Potassium: 4.6 mmol/L (ref 3.5–5.1)
SODIUM: 129 mmol/L — AB (ref 135–145)

## 2015-05-17 LAB — CBG MONITORING, ED
GLUCOSE-CAPILLARY: 405 mg/dL — AB (ref 65–99)
Glucose-Capillary: >600 mg/dL (ref 65–99)
Glucose-Capillary: 458 mg/dL — ABNORMAL HIGH (ref 65–99)
Glucose-Capillary: 484 mg/dL — ABNORMAL HIGH (ref 65–99)
Glucose-Capillary: 343 mg/dL — ABNORMAL HIGH (ref 65–99)

## 2015-05-17 MED ORDER — ACETAMINOPHEN 500 MG PO TABS
1000.0000 mg | ORAL_TABLET | Freq: Once | ORAL | Status: AC
  Administered 2015-05-17: 1000 mg via ORAL
  Filled 2015-05-17: qty 2

## 2015-05-17 MED ORDER — SODIUM CHLORIDE 0.9 % IV SOLN
1000.0000 mL | Freq: Once | INTRAVENOUS | Status: AC
  Administered 2015-05-17: 1000 mL via INTRAVENOUS

## 2015-05-17 MED ORDER — MAGIC MOUTHWASH
10.0000 mL | Freq: Once | ORAL | Status: AC
  Administered 2015-05-17: 10 mL via ORAL
  Filled 2015-05-17: qty 10

## 2015-05-17 MED ORDER — LIDOCAINE VISCOUS 2 % MT SOLN
15.0000 mL | Freq: Once | OROMUCOSAL | Status: AC
  Administered 2015-05-17: 15 mL via OROMUCOSAL
  Filled 2015-05-17: qty 15

## 2015-05-17 MED ORDER — INSULIN ASPART 100 UNIT/ML ~~LOC~~ SOLN
10.0000 [IU] | SUBCUTANEOUS | Status: AC
  Administered 2015-05-18: 10 [IU] via SUBCUTANEOUS
  Filled 2015-05-17: qty 1

## 2015-05-17 MED ORDER — AMOXICILLIN 500 MG PO CAPS: 500.0000 mg | ORAL_CAPSULE | Freq: Three times a day (TID) | ORAL | Status: DC

## 2015-05-17 MED ORDER — IBUPROFEN 800 MG PO TABS
800.0000 mg | ORAL_TABLET | Freq: Once | ORAL | Status: AC
Start: 2015-05-17 — End: 2015-05-17
  Administered 2015-05-17: 800 mg via ORAL
  Filled 2015-05-17: qty 1

## 2015-05-17 MED ORDER — DEXTROSE-NACL 5-0.45 % IV SOLN: INTRAVENOUS | Status: DC

## 2015-05-17 MED ORDER — IBUPROFEN 600 MG PO TABS: 600.0000 mg | ORAL_TABLET | Freq: Four times a day (QID) | ORAL | Status: DC | PRN

## 2015-05-17 MED ORDER — LIDOCAINE VISCOUS 2 % MT SOLN: 20.0000 mL | OROMUCOSAL | Status: DC | PRN

## 2015-05-17 MED ORDER — SODIUM CHLORIDE 0.9 % IV SOLN
INTRAVENOUS | Status: DC
  Administered 2015-05-17: 5.4 [IU]/h via INTRAVENOUS
  Filled 2015-05-17: qty 2.5

## 2015-05-17 MED ORDER — AMOXICILLIN 500 MG PO CAPS
500.0000 mg | ORAL_CAPSULE | Freq: Once | ORAL | Status: AC
  Administered 2015-05-17: 500 mg via ORAL
  Filled 2015-05-17: qty 1

## 2015-05-17 MED ORDER — SODIUM CHLORIDE 0.9 % IV BOLUS (SEPSIS)
1000.0000 mL | Freq: Once | INTRAVENOUS | Status: AC
  Administered 2015-05-17: 1000 mL via INTRAVENOUS

## 2015-05-17 MED ORDER — SODIUM CHLORIDE 0.9 % IV SOLN
1000.0000 mL | INTRAVENOUS | Status: DC
  Administered 2015-05-17: 1000 mL via INTRAVENOUS

## 2015-05-17 MED ORDER — INSULIN ASPART 100 UNIT/ML ~~LOC~~ SOLN: 0.0000 [IU] | Freq: Three times a day (TID) | SUBCUTANEOUS | Status: DC

## 2015-05-17 NOTE — Discharge Instructions (Signed)
Please follow up with your primary care physician in 1-2 days. If you do not have one please call the [facility] number listed above. Please follow up with a dentist from below to schedule a follow up appointment.  Please take your antibiotic until completion. Please read all discharge instructions and return precautions.   Hyperglycemia Hyperglycemia occurs when the glucose (sugar) in your blood is too high. Hyperglycemia can happen for many reasons, but it most often happens to people who do not know they have diabetes or are not managing their diabetes properly.  CAUSES  Whether you have diabetes or not, there are other causes of hyperglycemia. Hyperglycemia can occur when you have diabetes, but it can also occur in other situations that you might not be as aware of, such as: Diabetes 1. If you have diabetes and are having problems controlling your blood glucose, hyperglycemia could occur because of some of the following reasons: 1. Not following your meal plan. 2. Not taking your diabetes medications or not taking it properly. 3. Exercising less or doing less activity than you normally do. 4. Being sick. Pre-diabetes  This cannot be ignored. Before people develop Type 2 diabetes, they almost always have "pre-diabetes." This is when your blood glucose levels are higher than normal, but not yet high enough to be diagnosed as diabetes. Research has shown that some long-term damage to the body, especially the heart and circulatory system, may already be occurring during pre-diabetes. If you take action to manage your blood glucose when you have pre-diabetes, you may delay or prevent Type 2 diabetes from developing. Stress  If you have diabetes, you may be "diet" controlled or on oral medications or insulin to control your diabetes. However, you may find that your blood glucose is higher than usual in the hospital whether you have diabetes or not. This is often referred to as  "stress hyperglycemia." Stress can elevate your blood glucose. This happens because of hormones put out by the body during times of stress. If stress has been the cause of your high blood glucose, it can be followed regularly by your caregiver. That way he/she can make sure your hyperglycemia does not continue to get worse or progress to diabetes. Steroids  Steroids are medications that act on the infection fighting system (immune system) to block inflammation or infection. One side effect can be a rise in blood glucose. Most people can produce enough extra insulin to allow for this rise, but for those who cannot, steroids make blood glucose levels go even higher. It is not unusual for steroid treatments to "uncover" diabetes that is developing. It is not always possible to determine if the hyperglycemia will go away after the steroids are stopped. A special blood test called an A1c is sometimes done to determine if your blood glucose was elevated before the steroids were started. SYMPTOMS  Thirsty.  Frequent urination.  Dry mouth.  Blurred vision.  Tired or fatigue.  Weakness.  Sleepy.  Tingling in feet or leg. DIAGNOSIS  Diagnosis is made by monitoring blood glucose in one or all of the following ways:  A1c test. This is a chemical found in your blood.  Fingerstick blood glucose monitoring.  Laboratory results. TREATMENT  First, knowing the cause of the hyperglycemia is important before the hyperglycemia can be treated. Treatment may include, but is not be limited to:  Education.  Change or adjustment in medications.  Change or adjustment in meal plan.  Treatment for an  illness, infection, etc.  More frequent blood glucose monitoring.  Change in exercise plan.  Decreasing or stopping steroids.  Lifestyle changes. HOME CARE INSTRUCTIONS   Test your blood glucose as directed.  Exercise regularly. Your caregiver will give you instructions about exercise. Pre-diabetes  or diabetes which comes on with stress is helped by exercising.  Eat wholesome, balanced meals. Eat often and at regular, fixed times. Your caregiver or nutritionist will give you a meal plan to guide your sugar intake.  Being at an ideal weight is important. If needed, losing as little as 10 to 15 pounds may help improve blood glucose levels. SEEK MEDICAL CARE IF:   You have questions about medicine, activity, or diet.  You continue to have symptoms (problems such as increased thirst, urination, or weight gain). SEEK IMMEDIATE MEDICAL CARE IF:   You are vomiting or have diarrhea.  Your breath smells fruity.  You are breathing faster or slower.  You are very sleepy or incoherent.  You have numbness, tingling, or pain in your feet or hands.  You have chest pain.  Your symptoms get worse even though you have been following your caregiver's orders.  If you have any other questions or concerns. Document Released: 05/30/2001 Document Revised: 02/26/2012 Document Reviewed: 04/01/2012 [facility] Patient Information 2015 [place], [place]. This information is not intended to replace advice given to you by your health care provider. Make sure you discuss any questions you have with your health care provider.  RESOURCE GUIDE  If you do not have a primary care doctor to follow up with regarding today's visit, please call the [facility] at [phone] to make an appointment. Hours of operation are 10am - 7pm, Monday through Friday, and they have a sliding scale fee.    Dental Assistance Please contact the on-call dentist listed on your discharge papers WITHIN 48 HOURS.  If the on-call dentist agrees that your condition is emergent, there is a CHANCE (not a guarantee) that you MAY receive a savings on your visit at this point. If you wait more than 48 hours, it will not be considered an emergency.   [facility]:  [address], [place], [place], [address],  [phone]  Short-notice availability  Tooth evaluation: $100  Emergency Treatment: $200 (including exam, xrays, tooth extraction, and post-op visit)  Patients with Medicaid: [facility] [phone] [address], [phone] [address] [phone]  If unable to pay, or uninsured, contact [facility] [phone] in [place], [phone] in [facility]) to become qualified for the adult dental clinic  Other Low-Cost Community [facility] Services: - [facility]: [address] [place], [place], [place], [address], [phone], Ext. [phone], 2nd and 4th Thursday of the month at 6:30am.  10 clients each day by appointment, can sometimes see walk-in patients if someone does not show for an appointment. [facility]:  [address] [place], [place], [address], [phone] [facility]:  [address], [place], [place], [address], [phone] [facility] Health Department:  [phone] [facility] Health Department:  [phone] [facility] Health Department:  [phone]

## 2015-05-17 NOTE — ED Notes (Addendum)
Pt requesting more pain medication stating "my mouth is numb but it still hurts, like the surface is numb but my face hurts, its a deeper pain"; pt also requesting 1st dose of ABX IV; PA-C notified

## 2015-05-17 NOTE — ED Provider Notes (Signed)
CSN: [id number]     Arrival date & time 05/17/15  1645 History   First MD Initiated Contact with Patient 05/17/15 1751     Chief Complaint  Patient presents with  . Dental Pain     (Consider location/radiation/quality/duration/timing/severity/associated sxs/prior Treatment) HPI Comments: He is a 29 year old male past medical history significant for DM, depression, schizoaffective disorder, polysubstance abuse, chronic pain, GERD, medication noncompliance presenting to the emergency department for 2 complaints. Patient's first complaint is left-sided dental pain with radiation to his jaw began a few days ago. He felt like he had some pus drainage in his mouth. Denies any fevers, shortness of breath, chest pain, difficulty swallowing. He has not tried anything at home. No modifying factors. His second complaint is that he his blood sugars are highest he has not been able to take his insulin or check his levels in the past few weeks.   Past Medical History  Diagnosis Date  . Diabetes mellitus   . Depression   . Schizo affective schizophrenia   . Polysubstance abuse   . Scoliosis   . Chronic pain   . Noncompliance with medication regimen   . DKA (diabetic ketoacidoses) 11/18/2014  . GERD (gastroesophageal reflux disease)   . Arthritis     "both hips" (12/21/2014)   Past Surgical History  Procedure Laterality Date  . Hand surgery Left    Family History  Problem Relation Age of Onset  . Diabetes Mother    History  Substance Use Topics  . Smoking status: Current Some Day Smoker -- 1.00 packs/day for 7 years    Types: Cigarettes  . Smokeless tobacco: Never Used  . Alcohol Use: No     Comment: 12/21/2014 "I drink on a rare occasion"    Review of Systems  Constitutional: Negative for fever.  HENT: Positive for dental problem.   Respiratory: Negative for cough and shortness of breath.   Cardiovascular: Negative for chest pain.  Gastrointestinal: Negative for nausea, vomiting,  abdominal pain and diarrhea.  All other systems reviewed and are negative.     Allergies  Sulfa antibiotics  Home Medications   Prior to Admission medications   Medication Sig Start Date End Date Taking? Authorizing Provider  doxepin (SINEQUAN) 100 MG capsule Take 100 mg by mouth at bedtime.   Yes Historical Provider, MD  QUEtiapine (SEROQUEL) 100 MG tablet Take 100 mg by mouth 2 (two) times daily.   Yes Historical Provider, MD  traZODone (DESYREL) 100 MG tablet Take 2 tablets (200 mg total) by mouth at bedtime. 04/19/15  Yes Beau Fanny, FNP  amoxicillin (AMOXIL) 500 MG capsule Take 1 capsule (500 mg total) by mouth 3 (three) times daily. 05/17/15   Kriya Westra, PA-C  benztropine (COGENTIN) 0.5 MG tablet Take 1 tablet (0.5 mg total) by mouth 2 (two) times daily. For prevention of drug induced tremors Patient not taking: Reported on 04/19/2015 04/19/15   Beau Fanny, FNP  divalproex (DEPAKOTE) 500 MG DR tablet Take 1 Tablet (500 mg) daily & 1.5 tablets (750 mg) at bedtime: For mood stabilization Patient not taking: Reported on 04/19/2015 03/29/15   Quentin Angst, MD  gabapentin (NEURONTIN) 300 MG capsule Take 1 capsule (300 mg total) by mouth 3 (three) times daily at 8am, 3pm and bedtime. For agitation Patient not taking: Reported on 04/19/2015 03/29/15   Quentin Angst, MD  hydrOXYzine (ATARAX/VISTARIL) 25 MG tablet Take 1 tablet (25 mg total) by mouth 3 (three) times daily as needed for  anxiety. Patient not taking: Reported on 04/19/2015 03/29/15   Quentin Angst, MD  ibuprofen (ADVIL,MOTRIN) 600 MG tablet Take 1 tablet (600 mg total) by mouth every 6 (six) hours as needed. 05/17/15   Jaxsyn Catalfamo, PA-C  lidocaine (XYLOCAINE) 2 % solution Use as directed 20 mLs in the mouth or throat as needed for mouth pain. 05/17/15   Madelina Sanda, PA-C  risperiDONE (RISPERDAL M-TABS) 2 MG disintegrating tablet Take 1 tablet (2 mg) daily & 2 tablets (4 mg) at bedtime: For  mood control Patient not taking: Reported on 04/19/2015 03/29/15   Quentin Angst, MD   BP 126/73 mmHg  Pulse 85  Temp(Src) 98.4 F (36.9 C) (Oral)  Resp 17  Ht 6\' 1"  (1.854 m)  Wt 160 lb (72.576 kg)  BMI 21.11 kg/m2  SpO2 100% Physical Exam  Constitutional: He is oriented to person, place, and time. He appears well-developed and well-nourished. No distress.  HENT:  Head: Normocephalic and atraumatic.  Right Ear: External ear normal.  Left Ear: External ear normal.  Nose: Nose normal.  Mouth/Throat: Uvula is midline and mucous membranes are normal. No trismus in the jaw. Abnormal dentition. Dental caries present. No uvula swelling.  Submental and sublingual spaces are soft.  Eyes: Conjunctivae are normal.  Neck: Normal range of motion. Neck supple.  Cardiovascular: Regular rhythm and normal heart sounds.  Tachycardia present.   Pulmonary/Chest: Effort normal. No respiratory distress.  Neurological: He is alert and oriented to person, place, and time.  Skin: Skin is warm and dry. He is not diaphoretic.  Psychiatric: He has a normal mood and affect.  Nursing note and vitals reviewed.   ED Course  Procedures (including critical care time) Medications  0.9 %  sodium chloride infusion (0 mLs Intravenous Stopped 05/17/15 2009)    Followed by  0.9 %  sodium chloride infusion (1,000 mLs Intravenous New Bag/Given 05/17/15 2007)  insulin aspart (novoLOG) injection 10 Units (not administered)  sodium chloride 0.9 % bolus 1,000 mL (0 mLs Intravenous Stopped 05/17/15 2008)  lidocaine (XYLOCAINE) 2 % viscous mouth solution 15 mL (15 mLs Mouth/Throat Given 05/17/15 1848)  ibuprofen (ADVIL,MOTRIN) tablet 800 mg (800 mg Oral Given 05/17/15 1848)  amoxicillin (AMOXIL) capsule 500 mg (500 mg Oral Given 05/17/15 2029)  magic mouthwash (10 mLs Oral Given 05/17/15 2109)  acetaminophen (TYLENOL) tablet 1,000 mg (1,000 mg Oral Given 05/17/15 2329)    Labs Review Labs Reviewed  BASIC METABOLIC PANEL  - Abnormal; Notable for the following:    Sodium 129 (*)    Chloride 96 (*)    Glucose, Bld 746 (*)    All other components within normal limits  URINALYSIS, ROUTINE W REFLEX MICROSCOPIC (NOT AT [facility]) - Abnormal; Notable for the following:    Specific Gravity, Urine 1.040 (*)    Glucose, UA >1000 (*)    Ketones, ur 40 (*)    All other components within normal limits  CBG MONITORING, ED - Abnormal; Notable for the following:    Glucose-Capillary >600 (*)    All other components within normal limits  CBG MONITORING, ED - Abnormal; Notable for the following:    Glucose-Capillary 484 (*)    All other components within normal limits  CBG MONITORING, ED - Abnormal; Notable for the following:    Glucose-Capillary 458 (*)    All other components within normal limits  CBG MONITORING, ED - Abnormal; Notable for the following:    Glucose-Capillary 405 (*)    All other components  within normal limits  CBG MONITORING, ED - Abnormal; Notable for the following:    Glucose-Capillary 343 (*)    All other components within normal limits  I-STAT CHEM 8, ED - Abnormal; Notable for the following:    Sodium 134 (*)    Chloride 99 (*)    Creatinine, Ser 0.60 (*)    Glucose, Bld 407 (*)    All other components within normal limits  CBC WITH DIFFERENTIAL/PLATELET  URINE MICROSCOPIC-ADD ON    Imaging Review No results found.   EKG Interpretation None      MDM   Final diagnoses:  Hyperglycemia without ketosis  Pain, dental    Filed Vitals:   05/17/15 2330  BP: 126/73  Pulse: 85  Temp:   Resp: 17   Afebrile, NAD, non-toxic appearing, AAOx4.   I have reviewed nursing notes, vital signs, and all appropriate lab and imaging results if ordered as above.   1) Hyperglycemia with DKA: Patient presenting with elevated glucose at 746. Anion gap is normal. Bicarbonate is normal. IV fluids, IV insulin drip given for correction of hyperglycemia without evidence of DKA. Hyperglycemia corrected.  Electrolytes rechecked, anion gap remains normal with sodium and chloride correction.   2) Dental Pain: Patient with toothache.  No gross abscess.  Exam unconcerning for Ludwig's angina or spread of infection.  Will treat with Amoxil and symptomatic care.  Urged patient to follow-up with dentist.    Discussed the importance of dental follow-up. Discussed the importance of PCP follow-up. Discussed importance of taking at home insulin for better glycemic control. Return precautions discussed. Patient is stable at time of discharge  Patient d/w with Dr. Blinda Leatherwood, agrees with plan.    Francee Piccolo, PA-C 05/18/15 0001  Gilda Crease, MD 05/18/15 [phone]

## 2015-05-17 NOTE — ED Notes (Signed)
Pt in c/o toothache for the last few days, pt also concerned that his sugar levels are high, has not been able to check it or take insulin for the past few weeks

## 2015-05-18 LAB — CBG MONITORING, ED: GLUCOSE-CAPILLARY: 359 mg/dL — AB (ref 65–99)

## 2015-05-26 ENCOUNTER — Encounter: Payer: Self-pay | Admitting: Family

## 2015-05-26 ENCOUNTER — Telehealth: Payer: Self-pay | Admitting: Family

## 2015-05-26 ENCOUNTER — Ambulatory Visit (INDEPENDENT_AMBULATORY_CARE_PROVIDER_SITE_OTHER): Payer: PPO | Admitting: Family

## 2015-05-26 ENCOUNTER — Encounter: Payer: Self-pay | Admitting: Family Medicine

## 2015-05-26 ENCOUNTER — Ambulatory Visit (INDEPENDENT_AMBULATORY_CARE_PROVIDER_SITE_OTHER): Payer: PPO | Admitting: Family Medicine

## 2015-05-26 VITALS — BP 112/80 | HR 106 | Temp 97.8°F | Ht 73.0 in | Wt 155.0 lb

## 2015-05-26 DIAGNOSIS — M7071 Other bursitis of hip, right hip: Secondary | ICD-10-CM | POA: Diagnosis not present

## 2015-05-26 DIAGNOSIS — F411 Generalized anxiety disorder: Secondary | ICD-10-CM

## 2015-05-26 DIAGNOSIS — M7072 Other bursitis of hip, left hip: Secondary | ICD-10-CM

## 2015-05-26 DIAGNOSIS — F259 Schizoaffective disorder, unspecified: Secondary | ICD-10-CM

## 2015-05-26 DIAGNOSIS — E1065 Type 1 diabetes mellitus with hyperglycemia: Secondary | ICD-10-CM | POA: Diagnosis not present

## 2015-05-26 DIAGNOSIS — IMO0002 Reserved for concepts with insufficient information to code with codable children: Secondary | ICD-10-CM

## 2015-05-26 MED ORDER — TRAZODONE HCL 100 MG PO TABS: 200.0000 mg | ORAL_TABLET | Freq: Every day | ORAL | Status: DC

## 2015-05-26 MED ORDER — QUETIAPINE FUMARATE 100 MG PO TABS
100.0000 mg | ORAL_TABLET | Freq: Two times a day (BID) | ORAL | Status: DC
Start: 2015-05-26 — End: 2015-06-23

## 2015-05-26 MED ORDER — DOXEPIN HCL 100 MG PO CAPS: 100.0000 mg | ORAL_CAPSULE | Freq: Every day | ORAL | Status: DC

## 2015-05-26 MED ORDER — INSULIN ASPART PROT & ASPART (70-30 MIX) 100 UNIT/ML PEN: 80.0000 [IU] | PEN_INJECTOR | Freq: Two times a day (BID) | SUBCUTANEOUS | Status: DC

## 2015-05-26 MED ORDER — AMPHETAMINE-DEXTROAMPHETAMINE 20 MG PO TABS
40.0000 mg | ORAL_TABLET | Freq: Two times a day (BID) | ORAL | Status: DC
Start: 2015-05-26 — End: 2015-06-23

## 2015-05-26 MED ORDER — AMPHETAMINE-DEXTROAMPHETAMINE 20 MG PO TABS: 20.0000 mg | ORAL_TABLET | Freq: Two times a day (BID) | ORAL | Status: DC

## 2015-05-26 NOTE — Assessment & Plan Note (Signed)
Type 1 diabetes uncontrolled with current regimen and last A1c of 12.0. History of non-compliance with medication evidenced by self discontinuation of sliding scale Novolog and metformin. Discussed importance of continued use of prescribed medication. Continue current 40 U  Novolog 70/30 twice daily. Recheck A1c in 1 month. Continue to monitor blood sugars at home. Pending A1c may have to re-add metformin and sliding scale Novolog. Novolog 70/30 refilled.

## 2015-05-26 NOTE — Assessment & Plan Note (Signed)
Patient pain seems to be bilateral hip pain. I think this is secondary to more of a bursitis. Patient also has significant tightness of the hip girdle. At this time I do not feel that injections would be beneficial for this individual secondary to his other comorbidities and some noncompliance with his diabetes. Discussed with patient that I would like to avoid injections in case as raising his blood sugar at this time. Patient is going to a topical as well as an oral anti-inflammatory for short course. We discussed icing regimen and patient given a trial of home exercises. Patient will come back in 3 weeks and then we will consider injections as long his blood sugars have been more well controlled.

## 2015-05-26 NOTE — Patient Instructions (Signed)
Good to see you.  Ice 20 minutes 2 times daily. Usually after activity and before bed. Exercises 3 times a week.  Duexis 3 times a day for 6 days pennsaid pinkie amount topically 2 times daily as needed.  See me again 3 weeks and if not better and blood sugars controlled we can try injection.

## 2015-05-26 NOTE — Progress Notes (Signed)
Subjective:    Patient ID: [name], male    DOB: [birth year], 29 y.o.   MRN: [id number]  Chief Complaint  Patient presents with  . Establish Care    Type 1 Diabetes, Schizoaffective Disorder    HPI:  [name] is a 29 y.o. male with a PMH of Type 1 diabetes, chronic hip pain, schizoaffective disorder, depression, and bipolar  who presents today for an office visit to establish care.    1.) Diabetes - Type 1 diabetes diagnosed at the age of 89. Currently maintained on Novolog 70/30 with 40 units twice daily. Currently takes his blood sugars at home and ranges from 130-170 preprandially. Currently out of insulin for a couple of days. Due a diabetic eye exam. Previous notes indicate use of Novolog sliding scale insulin and metformin that patient reports he has not been taking.   Lab Results  Component Value Date   HGBA1C 12.0* 03/22/2015    2.) Schizoaffective Disorder - Diagnosed in 2009. Currently maintained doxepin, seroquel, and Adderall. Indicates that the Adderall was prescribed for treatment related to depression which he reports helps significantly. Currently experiencing fatigue and decreased will power to get things done. Has been out of the medications for about 2 months. Previous notes indicate patient was prescribed  Benztropine for drug induced tremors, depakote for mood stabilization and risperidone to assist with sleep. Indicates that he is not currently taking these medications secondary to lack feeling effectiveness.   Allergies  Allergen Reactions  . Sulfa Antibiotics Other (See Comments)    Unknown - Childhood   3.) Generalized Anxiety Disorder - Previously prescribed hydroxizine and trazodone. Notes that he is not currently taking the hydroxizine but continues to take the trazadone to assist with sleep.   Outpatient Prescriptions Prior to Visit  Medication Sig Dispense Refill  . doxepin (SINEQUAN) 100 MG capsule Take 100 mg by mouth at bedtime.      [name] ibuprofen (ADVIL,MOTRIN) 600 MG tablet Take 1 tablet (600 mg total) by mouth every 6 (six) hours as needed. 30 tablet 0  . QUEtiapine (SEROQUEL) 100 MG tablet Take 100 mg by mouth 2 (two) times daily.    . traZODone (DESYREL) 100 MG tablet Take 2 tablets (200 mg total) by mouth at bedtime. 28 tablet 0  . amoxicillin (AMOXIL) 500 MG capsule Take 1 capsule (500 mg total) by mouth 3 (three) times daily. 30 capsule 0  . benztropine (COGENTIN) 0.5 MG tablet Take 1 tablet (0.5 mg total) by mouth 2 (two) times daily. For prevention of drug induced tremors (Patient not taking: Reported on 04/19/2015) 60 tablet 3  . divalproex (DEPAKOTE) 500 MG DR tablet Take 1 Tablet (500 mg) daily & 1.5 tablets (750 mg) at bedtime: For mood stabilization (Patient not taking: Reported on 04/19/2015) 75 tablet 3  . gabapentin (NEURONTIN) 300 MG capsule Take 1 capsule (300 mg total) by mouth 3 (three) times daily at 8am, 3pm and bedtime. For agitation (Patient not taking: Reported on 04/19/2015) 90 capsule 3  . hydrOXYzine (ATARAX/VISTARIL) 25 MG tablet Take 1 tablet (25 mg total) by mouth 3 (three) times daily as needed for anxiety. (Patient not taking: Reported on 04/19/2015) 60 tablet 3  . lidocaine (XYLOCAINE) 2 % solution Use as directed 20 mLs in the mouth or throat as needed for mouth pain. (Patient not taking: Reported on 05/26/2015) 100 mL 0  . risperiDONE (RISPERDAL M-TABS) 2 MG disintegrating tablet Take 1 tablet (2 mg) daily & 2 tablets (4  mg) at bedtime: For mood control (Patient not taking: Reported on 04/19/2015) 90 tablet 3   No facility-administered medications prior to visit.     Past Medical History  Diagnosis Date  . Depression   . Schizo affective schizophrenia   . Polysubstance abuse   . Scoliosis   . Chronic pain   . Noncompliance with medication regimen   . GERD (gastroesophageal reflux disease)   . Arthritis     "both hips" (12/21/2014)  . Asthma     Childhood  . Chicken pox   . Diabetes mellitus      Type 1 diabetes  . DKA (diabetic ketoacidoses) 11/18/2014  . Hypertension      Past Surgical History  Procedure Laterality Date  . Hand surgery Left     Left thumb surgery     Family History  Problem Relation Age of Onset  . Diabetes Mother      History   Social History  . Marital Status: Legally Separated    Spouse Name: N/A  . Number of Children: 0  . Years of Education: 13   Occupational History  . Disability    Social History Main Topics  . Smoking status: Current Some Day Smoker -- 1.00 packs/day for 7 years    Types: Cigarettes  . Smokeless tobacco: Never Used  . Alcohol Use: No     Comment: 12/21/2014 "I drink on a rare occasion"  . Drug Use: No     Comment: 12/21/2014 "I use marijuana on a rare occasion", last used marijuana 01/30/2015  . Sexual Activity: Yes   Other Topics Concern  . Not on file   Social History Narrative   ** Merged History Encounter **    Fun: Video games        Review of Systems  Constitutional: Positive for fatigue.  Eyes:       Negative for changes of vision.   Respiratory: Negative for chest tightness and shortness of breath.   Cardiovascular: Negative for chest pain, palpitations and leg swelling.  Endocrine: Positive for polydipsia and polyuria. Negative for polyphagia.      Objective:    BP 112/80 mmHg  Pulse 106  Temp(Src) 97.8 F (36.6 C) (Oral)  Ht 6\' 1"  (1.854 m)  Wt 155 lb (70.308 kg)  BMI 20.45 kg/m2  SpO2 96% Nursing note and vital signs reviewed.  Physical Exam  Constitutional: He is oriented to person, place, and time. He appears well-developed and well-nourished. No distress.  Cardiovascular: Normal rate, regular rhythm, normal heart sounds and intact distal pulses.   Pulmonary/Chest: Effort normal and breath sounds normal.  Neurological: He is alert and oriented to person, place, and time.  Skin: Skin is warm and dry.  Psychiatric: He has a normal mood and affect. His behavior is normal. Judgment  and thought content normal.       Assessment & Plan:   Problem List Items Addressed This Visit      Endocrine   Diabetes type 1, uncontrolled    Type 1 diabetes uncontrolled with current regimen and last A1c of 12.0. History of non-compliance with medication evidenced by self discontinuation of sliding scale Novolog and metformin. Discussed importance of continued use of prescribed medication. Continue current 40 U  Novolog 70/30 twice daily. Recheck A1c in 1 month. Continue to monitor blood sugars at home. Pending A1c may have to re-add metformin and sliding scale Novolog. Novolog 70/30 refilled.       Relevant Medications   Lancets (  FREESTYLE) lancets   FREESTYLE LITE test strip   insulin aspart protamine - aspart (NOVOLOG 70/30 MIX) (70-30) 100 UNIT/ML FlexPen     Other   Schizoaffective disorder, unspecified type    Currently maintained on Seroquel, doxepin, and adderall following self-discontinuation of previously prescribed risperidone, cogentin, and depakote. Continue current dosages of seroquel, doxepin and Adderall. Follow up in 1 month to determine mood and behaviors. Currently denies any auditory/visual hallucinations. May refer to psychiatry if symptoms worsen with current regimen. Controlled substance contract signed and UDS completed. [place] controlled substance database reviewed.       Relevant Medications   traZODone (DESYREL) 100 MG tablet   QUEtiapine (SEROQUEL) 100 MG tablet   doxepin (SINEQUAN) 100 MG capsule   amphetamine-dextroamphetamine (ADDERALL) 20 MG tablet   Generalized anxiety disorder - Primary    Anxiety is labile following discontinuation of hydroxyzine. Continue trazodone for sleep.

## 2015-05-26 NOTE — Progress Notes (Signed)
[facility] [address] [place], [place] [address] Phone: [phone] Subjective:    I'm seeing this patient by the request  of:  Jeanine Luz, FNP   CC:  Bilateral hip pain  ERX:[id number] [name] is a 29 y.o. male coming in with complaint of  Bilateral hip pain on the interior and exterior. Patient discusses pain as more of a dull aching sensation. Has had this pain multiple years she states. Has had   steroid injections.  Patient states that his last injections is greater than the near ago. Patient past medical history significant for diabetes and has been controlled recently as well as schizophrenia. Patient states that it is more of a dull aching pain with no significant radiation. Patient states that there is significant tightness. Patient has had difficulty actually doing activities and sometimes he has to stop activities. Rates the severity of pain a 9 out of 10. States that the pain medication he was given by the emergency room has not been beneficial.  Past Medical History  Diagnosis Date  . Depression   . Schizo affective schizophrenia   . Polysubstance abuse   . Scoliosis   . Chronic pain   . Noncompliance with medication regimen   . GERD (gastroesophageal reflux disease)   . Arthritis     "both hips" (12/21/2014)  . Asthma     Childhood  . Chicken pox   . Diabetes mellitus     Type 1 diabetes  . DKA (diabetic ketoacidoses) 11/18/2014  . Hypertension    Past Surgical History  Procedure Laterality Date  . Hand surgery Left     Left thumb surgery   History  Substance Use Topics  . Smoking status: Current Some Day Smoker -- 1.00 packs/day for 7 years    Types: Cigarettes  . Smokeless tobacco: Never Used  . Alcohol Use: No     Comment: 12/21/2014 "I drink on a rare occasion"   Allergies  Allergen Reactions  . Sulfa Antibiotics Other (See Comments)    Unknown - Childhood   Family History  Problem Relation Age of Onset    . Diabetes Mother         Past medical history, social, surgical and family history all reviewed in electronic medical record.   Review of Systems: No headache, visual changes, nausea, vomiting, diarrhea, constipation, dizziness, abdominal pain, skin rash, fevers, chills, night sweats, weight loss, swollen lymph nodes, body aches, joint swelling, muscle aches, chest pain, shortness of breath, mood changes.   Objective There were no vitals taken for this visit.  General: No apparent distress alert and oriented x3 mood and affect normal, dressed appropriately.  HEENT: Pupils equal, extraocular movements intact  Respiratory: Patient's speak in full sentences and does not appear short of breath  Cardiovascular: No lower extremity edema, non tender, no erythema  Skin: Warm dry intact with no signs of infection or rash on extremities or on axial skeleton.  Abdomen: Soft nontender  Neuro: Cranial nerves II through XII are intact, neurovascularly intact in all extremities with 2+ DTRs and 2+ pulses.  Lymph: No lymphadenopathy of posterior or anterior cervical chain or axillae bilaterally.  Gait normal with good balance and coordination.  MSK:  Non tender with full range of motion and good stability and symmetric strength and tone of shoulders, elbows, wrist,  knee and ankles bilaterally.  JKD:TOIZTIWPY ROM IR: 25 Deg, ER: 45 Deg, Flexion: 100 Deg, Extension: 100 Deg, Abduction: 45 Deg,  Adduction: 20 Deg mild tightness the musculature of the hip girdle Strength IR: 5/5, ER: 5/5, Flexion: 4/5, Extension: 5/5, Abduction: 4/5, Adduction: 4/5 Pelvic alignment unremarkable to inspection and palpation. Standing hip rotation and gait without trendelenburg sign / unsteadiness. Tender to palpation of the greater trochanteric Positive Faber bilaterally mild discomfort with internal rotation as well. No SI joint tenderness and normal minimal SI movement.   Impression and Recommendations:     This  case required medical decision making of moderate complexity.

## 2015-05-26 NOTE — Progress Notes (Signed)
Pre visit review using our clinic review tool, if applicable. No additional management support is needed unless otherwise documented below in the visit note. 

## 2015-05-26 NOTE — Telephone Encounter (Signed)
Rec'd from [facility] forward 32 pages to [facility]

## 2015-05-26 NOTE — Patient Instructions (Signed)
Thank you for choosing [facility].  Summary/Instructions:  Please follow up in 1 month for your A1c and medication refills.   Your prescription(s) have been submitted to your pharmacy or been printed and provided for you. Please take as directed and contact our office if you believe you are having problem(s) with the medication(s) or have any questions.  If your symptoms worsen or fail to improve, please contact our office for further instruction, or in case of emergency go directly to the emergency room at the closest medical facility.

## 2015-05-26 NOTE — Assessment & Plan Note (Signed)
Anxiety is labile following discontinuation of hydroxyzine. Continue trazodone for sleep.

## 2015-05-26 NOTE — Assessment & Plan Note (Addendum)
Currently maintained on Seroquel, doxepin, and adderall following self-discontinuation of previously prescribed risperidone, cogentin, and depakote. Continue current dosages of seroquel, doxepin and Adderall. Follow up in 1 month to determine mood and behaviors. Currently denies any auditory/visual hallucinations. May refer to psychiatry if symptoms worsen with current regimen. Controlled substance contract signed and UDS completed. [place] controlled substance database reviewed.

## 2015-06-01 ENCOUNTER — Encounter (HOSPITAL_COMMUNITY): Payer: Self-pay | Admitting: *Deleted

## 2015-06-01 ENCOUNTER — Inpatient Hospital Stay (HOSPITAL_COMMUNITY)
Admission: EM | Admit: 2015-06-01 | Discharge: 2015-06-03 | DRG: 638 | Disposition: A | Discharge status: Home / Self Care | Payer: PPO | Attending: Internal Medicine | Admitting: Internal Medicine

## 2015-06-01 DIAGNOSIS — M419 Scoliosis, unspecified: Secondary | ICD-10-CM | POA: Diagnosis present

## 2015-06-01 DIAGNOSIS — F17210 Nicotine dependence, cigarettes, uncomplicated: Secondary | ICD-10-CM | POA: Diagnosis present

## 2015-06-01 DIAGNOSIS — E1065 Type 1 diabetes mellitus with hyperglycemia: Secondary | ICD-10-CM | POA: Diagnosis present

## 2015-06-01 DIAGNOSIS — Z9114 Patient's other noncompliance with medication regimen: Secondary | ICD-10-CM | POA: Diagnosis present

## 2015-06-01 DIAGNOSIS — J45909 Unspecified asthma, uncomplicated: Secondary | ICD-10-CM | POA: Diagnosis present

## 2015-06-01 DIAGNOSIS — E871 Hypo-osmolality and hyponatremia: Secondary | ICD-10-CM | POA: Diagnosis present

## 2015-06-01 DIAGNOSIS — IMO0002 Reserved for concepts with insufficient information to code with codable children: Secondary | ICD-10-CM

## 2015-06-01 DIAGNOSIS — I10 Essential (primary) hypertension: Secondary | ICD-10-CM | POA: Diagnosis present

## 2015-06-01 DIAGNOSIS — R112 Nausea with vomiting, unspecified: Secondary | ICD-10-CM | POA: Diagnosis present

## 2015-06-01 DIAGNOSIS — E1010 Type 1 diabetes mellitus with ketoacidosis without coma: Secondary | ICD-10-CM | POA: Diagnosis present

## 2015-06-01 DIAGNOSIS — G8929 Other chronic pain: Secondary | ICD-10-CM | POA: Diagnosis present

## 2015-06-01 DIAGNOSIS — F259 Schizoaffective disorder, unspecified: Secondary | ICD-10-CM | POA: Diagnosis present

## 2015-06-01 DIAGNOSIS — K219 Gastro-esophageal reflux disease without esophagitis: Secondary | ICD-10-CM | POA: Diagnosis present

## 2015-06-01 DIAGNOSIS — E860 Dehydration: Secondary | ICD-10-CM | POA: Diagnosis present

## 2015-06-01 DIAGNOSIS — Z794 Long term (current) use of insulin: Secondary | ICD-10-CM

## 2015-06-01 DIAGNOSIS — M1990 Unspecified osteoarthritis, unspecified site: Secondary | ICD-10-CM | POA: Diagnosis present

## 2015-06-01 DIAGNOSIS — E1110 Type 2 diabetes mellitus with ketoacidosis without coma: Secondary | ICD-10-CM | POA: Diagnosis present

## 2015-06-01 DIAGNOSIS — D72829 Elevated white blood cell count, unspecified: Secondary | ICD-10-CM | POA: Diagnosis present

## 2015-06-01 DIAGNOSIS — Z882 Allergy status to sulfonamides status: Secondary | ICD-10-CM

## 2015-06-01 DIAGNOSIS — F329 Major depressive disorder, single episode, unspecified: Secondary | ICD-10-CM | POA: Diagnosis present

## 2015-06-01 DIAGNOSIS — F209 Schizophrenia, unspecified: Secondary | ICD-10-CM | POA: Diagnosis present

## 2015-06-01 LAB — CBC
HCT: 49.9 % (ref 39.0–52.0)
HEMATOCRIT: 43.4 % (ref 39.0–52.0)
Hemoglobin: 17.7 g/dL — ABNORMAL HIGH (ref 13.0–17.0)
Hemoglobin: 15.5 g/dL (ref 13.0–17.0)
MCH: 30.1 pg (ref 26.0–34.0)
MCH: 30 pg (ref 26.0–34.0)
MCHC: 35.5 g/dL (ref 30.0–36.0)
MCHC: 35.7 g/dL (ref 30.0–36.0)
MCV: 84.9 fL (ref 78.0–100.0)
MCV: 84.1 fL (ref 78.0–100.0)
PLATELETS: 377 10*3/uL (ref 150–400)
Platelets: 320 10*3/uL (ref 150–400)
RBC: 5.88 MIL/uL — ABNORMAL HIGH (ref 4.22–5.81)
RBC: 5.16 MIL/uL (ref 4.22–5.81)
RDW: 12.6 % (ref 11.5–15.5)
RDW: 12.7 % (ref 11.5–15.5)
WBC: 12.5 10*3/uL — AB (ref 4.0–10.5)
WBC: 9.7 10*3/uL (ref 4.0–10.5)

## 2015-06-01 LAB — BASIC METABOLIC PANEL
Anion gap: 23 — ABNORMAL HIGH (ref 5–15)
Anion gap: 18 — ABNORMAL HIGH (ref 5–15)
BUN: 20 mg/dL (ref 6–20)
BUN: 17 mg/dL (ref 6–20)
CHLORIDE: 100 mmol/L — AB (ref 101–111)
CO2: 10 mmol/L — ABNORMAL LOW (ref 22–32)
CO2: 9 mmol/L — ABNORMAL LOW (ref 22–32)
Calcium: 9 mg/dL (ref 8.9–10.3)
Calcium: 8.1 mg/dL — ABNORMAL LOW (ref 8.9–10.3)
Chloride: 106 mmol/L (ref 101–111)
Creatinine, Ser: 1.17 mg/dL (ref 0.61–1.24)
Creatinine, Ser: 0.94 mg/dL (ref 0.61–1.24)
GFR calc Af Amer: >60 mL/min (ref >60)
GFR calc Af Amer: >60 mL/min (ref >60)
GFR calc non Af Amer: >60 mL/min (ref >60)
GLUCOSE: 353 mg/dL — AB (ref 65–99)
Glucose, Bld: 354 mg/dL — ABNORMAL HIGH (ref 65–99)
POTASSIUM: 4.4 mmol/L (ref 3.5–5.1)
Potassium: 3.9 mmol/L (ref 3.5–5.1)
SODIUM: 133 mmol/L — AB (ref 135–145)
Sodium: 133 mmol/L — ABNORMAL LOW (ref 135–145)

## 2015-06-01 LAB — RAPID URINE DRUG SCREEN, HOSP PERFORMED
Amphetamines: POSITIVE — AB
BARBITURATES: NOT DETECTED
Benzodiazepines: NOT DETECTED
COCAINE: NOT DETECTED
Opiates: NOT DETECTED
Tetrahydrocannabinol: NOT DETECTED

## 2015-06-01 LAB — GLUCOSE, CAPILLARY
GLUCOSE-CAPILLARY: 145 mg/dL — AB (ref 65–99)
GLUCOSE-CAPILLARY: 101 mg/dL — AB (ref 65–99)
Glucose-Capillary: 296 mg/dL — ABNORMAL HIGH (ref 65–99)
Glucose-Capillary: 307 mg/dL — ABNORMAL HIGH (ref 65–99)
Glucose-Capillary: 266 mg/dL — ABNORMAL HIGH (ref 65–99)
Glucose-Capillary: 215 mg/dL — ABNORMAL HIGH (ref 65–99)
Glucose-Capillary: 91 mg/dL (ref 65–99)

## 2015-06-01 LAB — CBG MONITORING, ED
GLUCOSE-CAPILLARY: 260 mg/dL — AB (ref 65–99)
GLUCOSE-CAPILLARY: 288 mg/dL — AB (ref 65–99)
Glucose-Capillary: 335 mg/dL — ABNORMAL HIGH (ref 65–99)
Glucose-Capillary: 286 mg/dL — ABNORMAL HIGH (ref 65–99)

## 2015-06-01 LAB — CREATININE, SERUM
Creatinine, Ser: 0.81 mg/dL (ref 0.61–1.24)
GFR calc Af Amer: >60 mL/min (ref >60)
GFR calc non Af Amer: >60 mL/min (ref >60)

## 2015-06-01 LAB — MRSA PCR SCREENING: MRSA by PCR: NEGATIVE

## 2015-06-01 MED ORDER — AMPHETAMINE-DEXTROAMPHETAMINE 10 MG PO TABS
30.0000 mg | ORAL_TABLET | Freq: Two times a day (BID) | ORAL | Status: DC
  Administered 2015-06-01 – 2015-06-03 (×4): 30 mg via ORAL
  Filled 2015-06-01 (×4): qty 3

## 2015-06-01 MED ORDER — POTASSIUM CHLORIDE 10 MEQ/100ML IV SOLN
10.0000 meq | INTRAVENOUS | Status: AC
  Administered 2015-06-01 (×2): 10 meq via INTRAVENOUS
  Filled 2015-06-01 (×2): qty 100

## 2015-06-01 MED ORDER — SODIUM CHLORIDE 0.9 % IV SOLN
INTRAVENOUS | Status: DC
  Filled 2015-06-01: qty 2.5

## 2015-06-01 MED ORDER — GI COCKTAIL ~~LOC~~
30.0000 mL | ORAL | Status: DC | PRN
  Administered 2015-06-01: 30 mL via ORAL
  Filled 2015-06-01 (×2): qty 30

## 2015-06-01 MED ORDER — SODIUM CHLORIDE 0.9 % IV SOLN
INTRAVENOUS | Status: DC
  Administered 2015-06-01: 17:00:00 via INTRAVENOUS

## 2015-06-01 MED ORDER — DEXTROSE-NACL 5-0.45 % IV SOLN
INTRAVENOUS | Status: DC
  Administered 2015-06-01: 23:00:00 via INTRAVENOUS

## 2015-06-01 MED ORDER — DOXEPIN HCL 100 MG PO CAPS
100.0000 mg | ORAL_CAPSULE | Freq: Every day | ORAL | Status: DC
  Administered 2015-06-01 – 2015-06-02 (×2): 100 mg via ORAL
  Filled 2015-06-01 (×4): qty 1

## 2015-06-01 MED ORDER — INSULIN REGULAR HUMAN 100 UNIT/ML IJ SOLN
INTRAMUSCULAR | Status: DC
  Administered 2015-06-01: 17:00:00 via INTRAVENOUS
  Filled 2015-06-01: qty 2.5

## 2015-06-01 MED ORDER — PANTOPRAZOLE SODIUM 40 MG IV SOLR
40.0000 mg | Freq: Every day | INTRAVENOUS | Status: DC
  Administered 2015-06-01 – 2015-06-03 (×3): 40 mg via INTRAVENOUS
  Filled 2015-06-01 (×3): qty 40

## 2015-06-01 MED ORDER — DEXTROSE-NACL 5-0.45 % IV SOLN: INTRAVENOUS | Status: DC

## 2015-06-01 MED ORDER — ONDANSETRON HCL 4 MG/2ML IJ SOLN: 4.0000 mg | Freq: Four times a day (QID) | INTRAMUSCULAR | Status: DC | PRN

## 2015-06-01 MED ORDER — SODIUM CHLORIDE 0.9 % IV SOLN: INTRAVENOUS | Status: AC

## 2015-06-01 MED ORDER — TRAZODONE HCL 100 MG PO TABS
200.0000 mg | ORAL_TABLET | Freq: Every day | ORAL | Status: DC
  Administered 2015-06-01 – 2015-06-02 (×2): 200 mg via ORAL
  Filled 2015-06-01 (×2): qty 2
  Filled 2015-06-01: qty 4

## 2015-06-01 MED ORDER — QUETIAPINE FUMARATE 100 MG PO TABS
100.0000 mg | ORAL_TABLET | Freq: Two times a day (BID) | ORAL | Status: DC
  Administered 2015-06-01 – 2015-06-03 (×4): 100 mg via ORAL
  Filled 2015-06-01 (×5): qty 1

## 2015-06-01 MED ORDER — ONDANSETRON HCL 4 MG/2ML IJ SOLN
4.0000 mg | Freq: Once | INTRAMUSCULAR | Status: AC
  Administered 2015-06-01: 4 mg via INTRAVENOUS
  Filled 2015-06-01: qty 2

## 2015-06-01 MED ORDER — SODIUM CHLORIDE 0.9 % IV BOLUS (SEPSIS)
1000.0000 mL | Freq: Once | INTRAVENOUS | Status: AC
  Administered 2015-06-01: 1000 mL via INTRAVENOUS

## 2015-06-01 MED ORDER — ONDANSETRON HCL 4 MG/2ML IJ SOLN
4.0000 mg | Freq: Four times a day (QID) | INTRAMUSCULAR | Status: DC
  Administered 2015-06-01: 4 mg via INTRAVENOUS
  Filled 2015-06-01: qty 2

## 2015-06-01 MED ORDER — ENOXAPARIN SODIUM 40 MG/0.4ML ~~LOC~~ SOLN
40.0000 mg | SUBCUTANEOUS | Status: DC
  Administered 2015-06-01 – 2015-06-02 (×2): 40 mg via SUBCUTANEOUS
  Filled 2015-06-01 (×3): qty 0.4

## 2015-06-01 MED ORDER — SODIUM CHLORIDE 0.9 % IV BOLUS (SEPSIS)
2000.0000 mL | Freq: Once | INTRAVENOUS | Status: AC
  Administered 2015-06-01: 2000 mL via INTRAVENOUS

## 2015-06-01 NOTE — Progress Notes (Addendum)
PT CBG at 2306 was 91. The drip rate was changed to 0.6 per protocol on-call physician was notified that the drip rate is <1 ml/hr. Pt has D51/2NS running. No new orders at this time. Will continue to monitor.

## 2015-06-01 NOTE — H&P (Signed)
[facility] History and Physical  RUMI TARAS SWH:[id number] DOB: March 24, 1986 DOA: 06/01/2015  Referring physician: Dr. Patria Mane  PCP: Jeanine Luz, FNP   Chief Complaint: N/V  HPI:  Pt is 29 yo male with known DM type I, insulin dependent, presented to [facility] ED with main concern of several days duration of progressively worsening nausea and non bloody vomiting, abd pain, located in epigastric area, constant and throbbing, non radiating, 7/10 in severity, worse with eating and with no specific alleviating factors, associated with poor oral intake. Pt has denies fevers, chills, recent sick contacts or exposures.   In ED, pt appeared tired and weak, noted several episodes of vomiting. Per blood work, pt noted to be in DKA and [facility] asked to admit for further evaluation.   Assessment and Plan: Active Problems: Acute DKA, secondary to uncontrolled DM in the setting of medical non compliance  - admit to SDU due to insulin drip requirement  - keep NPO for now, monitor BMP Q2 hours until gap closes - keep on IVF and readjust the type of fluid based on CBG - supplement electrolytes per protocol - diabetic educator consulted for further education on DM management  Leukocytosis - likely secondary to SKA - repeat CBC notable for WBC WNL - no need for ABX Hemoconcentration - secondary to DKA - resolving with IVF  N/V - secondary to DKA - provide antiemetics as needed  Schizophrenia - appears to be stable at this time DVT prophylaxis - Lovenox SQ  Radiological Exams on Admission: No results found.  Code Status: Full Family Communication: Pt at bedside Disposition Plan: Admit for further evaluation    Danie Binder [facility] [phone]   Review of Systems:  Constitutional: Negative for diaphoresis.  HENT: Negative for hearing loss, ear pain, nosebleeds, congestion, sore throat, neck pain, tinnitus and ear discharge.   Eyes: Negative for blurred vision, double vision, photophobia,  pain, discharge and redness.  Respiratory: Negative for cough, hemoptysis, sputum production, shortness of breath, wheezing and stridor.   Cardiovascular: Negative for chest pain, palpitations, orthopnea, claudication and leg swelling.  Gastrointestinal: Negative for heartburn, constipation, blood in stool and melena.  Genitourinary: Negative for hematuria and flank pain.  Musculoskeletal: Negative for myalgias, back pain, joint pain and falls.  Skin: Negative for itching and rash.  Neurological: Negative for dizziness and weakness.  Endo/Heme/Allergies: Negative for environmental allergies and polydipsia. Does not bruise/bleed easily.  Psychiatric/Behavioral: Negative for suicidal ideas. The patient is not nervous/anxious.      Past Medical History  Diagnosis Date  . Depression   . Schizo affective schizophrenia   . Polysubstance abuse   . Scoliosis   . Chronic pain   . Noncompliance with medication regimen   . GERD (gastroesophageal reflux disease)   . Arthritis     "both hips" (12/21/2014)  . Asthma     Childhood  . Chicken pox   . Diabetes mellitus     Type 1 diabetes  . DKA (diabetic ketoacidoses) 11/18/2014  . Hypertension     Past Surgical History  Procedure Laterality Date  . Hand surgery Left     Left thumb surgery    Social History:  reports that he has been smoking Cigarettes.  He has a 7 pack-year smoking history. He has never used smokeless tobacco. He reports that he does not drink alcohol or use illicit drugs.  Allergies  Allergen Reactions  . Sulfa Antibiotics Other (See Comments)    Unknown - Childhood    Family  History  Problem Relation Age of Onset  . Diabetes Mother     Prior to Admission medications   Medication Sig Start Date End Date Taking? Authorizing Provider  amphetamine-dextroamphetamine (ADDERALL) 20 MG tablet Take 2 tablets (40 mg total) by mouth 2 (two) times daily. 05/26/15  Yes Veryl Speak, FNP  doxepin (SINEQUAN) 100 MG capsule  Take 1 capsule (100 mg total) by mouth at bedtime. 05/26/15  Yes Veryl Speak, FNP  FREESTYLE LITE test strip TEST BLOOD SUGARS 3 TIMES DAILY AND WHEN NEEDED 03/29/15  Yes Historical Provider, MD  insulin aspart protamine - aspart (NOVOLOG 70/30 MIX) (70-30) 100 UNIT/ML FlexPen Inject 0.8 mLs (80 Units total) into the skin 2 (two) times daily. 05/26/15  Yes Veryl Speak, FNP  Lancets (FREESTYLE) lancets TEST 3 TIMES DAILY AND WHEN NEEDED 03/29/15  Yes Historical Provider, MD  QUEtiapine (SEROQUEL) 100 MG tablet Take 1 tablet (100 mg total) by mouth 2 (two) times daily. 05/26/15  Yes Veryl Speak, FNP  traZODone (DESYREL) 100 MG tablet Take 2 tablets (200 mg total) by mouth at bedtime. 05/26/15  Yes Veryl Speak, FNP    Physical Exam: Filed Vitals:   06/01/15 1136 06/01/15 1321  BP: 125/85 122/88  Pulse: 110 110  Temp: 97.7 F (36.5 C)   TempSrc: Oral   Resp: 26 24  SpO2: 94% 98%    Physical Exam  Constitutional: Appears tired and weak, NAD HENT: Normocephalic. External right and left ear normal. Dry MM Eyes: Conjunctivae and EOM are normal. PERRLA, no scleral icterus.  Neck: Normal ROM. Neck supple. No JVD. No tracheal deviation. No thyromegaly.  CVS: Regular rhythm, tachycardic, S1/S2 +, no murmurs, no gallops, no carotid bruit.  Pulmonary: Effort and breath sounds normal, no stridor, rhonchi, wheezes, rales.  Abdominal: Soft. BS +,  no distension, tenderness in epigastric area, no rebound or guarding.  Musculoskeletal: Normal range of motion. No edema and no tenderness.  Lymphadenopathy: No lymphadenopathy noted, cervical, inguinal. Neuro: Alert. Normal reflexes, muscle tone coordination. No cranial nerve deficit. Skin: Skin is warm and dry. No rash noted. Not diaphoretic. No erythema. No pallor.  Psychiatric: Normal mood and affect. Behavior, judgment, thought content normal.   Labs on Admission:  Basic Metabolic Panel:  Recent Labs Lab 06/01/15 1135  NA 133*  K 4.4   CL 100*  CO2 10*  GLUCOSE 354*  BUN 20  CREATININE 1.17  CALCIUM 9.0   CBC:  Recent Labs Lab 06/01/15 1135  WBC 12.5*  HGB 17.7*  HCT 49.9  MCV 84.9  PLT 377   CBG:  Recent Labs Lab 06/01/15 1139 06/01/15 1326  GLUCAP 335* 286*    EKG: Pending    If 7PM-7AM, please contact night-coverage [email or web address] Password [email or web address] 06/01/2015, 1:48 PM

## 2015-06-01 NOTE — ED Notes (Signed)
Pt in from home by ems. C/o fatigue, weakness, n/v over last few days. Has not taken insulin for DM1 for last few weeks. Unable to state how long he has been out. Per previous chart notes, pt was seen on 5/26 and stated he had not had it for 2 weeks before that. C/o abd pain, throat pain r/t vomiting. EMS CBG 380, BP 136/92, P130, RR18, O2 sat 98%, ST.

## 2015-06-01 NOTE — ED Notes (Signed)
Bed: XM46 Expected date:  Expected time:  Means of arrival:  Comments: EMS- 29yo M, hyperglycemia

## 2015-06-01 NOTE — ED Notes (Signed)
Pt completing fluid bolus. Delay starting insulin and glucostabilizer because of this. Will reassess CBG after rest of bolus is completed. Pt awaiting admission orders and bed assignment.

## 2015-06-01 NOTE — Progress Notes (Signed)
Inpatient Diabetes Program Recommendations  AACE/ADA: New Consensus Statement on Inpatient Glycemic Control (2013)  Target Ranges:  Prepandial:   less than 140 mg/dL      Peak postprandial:   less than 180 mg/dL (1-2 hours)      Critically ill patients:  140 - 180 mg/dL    Results for TEAGEN, MCLEARY (MRN [id number]) as of 06/01/2015 14:11  Ref. Range 06/01/2015 11:35  Sodium Latest Ref Range: 135-145 mmol/L 133 (L)  Potassium Latest Ref Range: 3.5-5.1 mmol/L 4.4  Chloride Latest Ref Range: 101-111 mmol/L 100 (L)  CO2 Latest Ref Range: 22-32 mmol/L 10 (L)  BUN Latest Ref Range: 6-20 mg/dL 20  Creatinine Latest Ref Range: 0.61-1.24 mg/dL 1.17  Calcium Latest Ref Range: 8.9-10.3 mg/dL 9.0  EGFR (Non-African Amer.) Latest Ref Range: >60 mL/min >60  EGFR (African American) Latest Ref Range: >60 mL/min >60  Glucose Latest Ref Range: 65-99 mg/dL 354 (H)  Anion gap Latest Ref Range: 5-15  23 (H)    Admit with: DKA  History: Type 1 Diabetes, Schizophrenia, Polysubstance Abuse  Home DM Meds: Novolog 70/30 Mix insulin- 40 units bidwc  Current DM Orders: IV Insulin drip per GlucoStabilizer     **Through Chart Review, found that patient established care with Mauricio Po, FNP with [facility] on 05/26/15.  Per those notes from that visit, patient admitted to not taking his insulin regularly.  Mr. Elna Breslow (NP with [facility]) refilled patient's 70/30 insulin Rx at this visit and patient was instructed to continue to take the 70/30 insulin at 40 units bidwc.  **Not sure why patient's med rec states that patient is supposed to be taking 70/30 insulin- 80 units bidwc.  I attempted to contact patient's pharmacy ([facility] on [address]) to verify home insulin dose, however, I could not reach anyone by phone there today.    **Called [facility] office.  Spoke with CMA for Mauricio Po, FNP.  CMA verified that 70/30 insulin dose should be 40 units bid with meals (80 units total  per day).  **Patient is well known to the Inpatient Glycemic Control Team.  He has multiple admissions and ED visits since 11/2014 (6 admissions and 5 ED visits- for both Psych issues and Hyperglycemia, Running out of insulin, etc).   Will follow and assist with Insulin adjustments as needed.  Wyn Quaker RN, MSN, CDE Diabetes Coordinator Inpatient Glycemic Control Team Team Pager: [phone] (8a-5p)

## 2015-06-01 NOTE — Progress Notes (Signed)
Cm noted Cm consult for medication assistance with insulin Pt reports paying presently $30 for Novolog 70/30  Cm shared [facility] has [facility] brand Insulin without a RX for $24.98 -- 70/30,  Relion Humulin R,  [place] N Novolin/ReliOn Human Insulin Novolin/ReliOn is human insulin that works to lower your blood sugar (glucose). Novolin/ReliOn is manufactured for [employer] by [employer], the [employer]  in insulin production. The Novolin/ReliOn line includes the following types of insulin: . Novolin N (NPH human insulin [rDNA origin] isophane suspension) . Novolin R U-100 (regular insulin human injection, USP [rDNA origin]) . Novolin 70/30 (70% human insulin isophane suspension, 30% human insulin injection [rDNA origin]) Novolin/ReliOn Human Insulin - $24.88 Any change in insulin should be made cautiously & only under the supervision of a doctor. Novolin is a Glass blower/designer. Insulin syringes $13.33 for box of 100 syringes And encouraged pt to discuss options with his pcp

## 2015-06-01 NOTE — ED Notes (Signed)
Attempted to call report to 2W. RN called away on RR call. Will return call once available.

## 2015-06-01 NOTE — ED Provider Notes (Signed)
CSN: [id number]     Arrival date & time 06/01/15  1126 History   First MD Initiated Contact with Patient 06/01/15 1128     Chief Complaint  Patient presents with  . Hyperglycemia      HPI Patient has a history of type 1 diabetes, schizophrenia, who presents with nausea vomiting and generalized weakness and fatigue over the past few days.  He states his blood sugars have been elevated.  He reports he has been out of his insulin for several weeks.  Denies blood in his vomit.  States he feels weak.  Symptoms are moderate in severity.   Past Medical History  Diagnosis Date  . Depression   . Schizo affective schizophrenia   . Polysubstance abuse   . Scoliosis   . Chronic pain   . Noncompliance with medication regimen   . GERD (gastroesophageal reflux disease)   . Arthritis     "both hips" (12/21/2014)  . Asthma     Childhood  . Chicken pox   . Diabetes mellitus     Type 1 diabetes  . DKA (diabetic ketoacidoses) 11/18/2014  . Hypertension    Past Surgical History  Procedure Laterality Date  . Hand surgery Left     Left thumb surgery   Family History  Problem Relation Age of Onset  . Diabetes Mother    History  Substance Use Topics  . Smoking status: Current Some Day Smoker -- 1.00 packs/day for 7 years    Types: Cigarettes  . Smokeless tobacco: Never Used  . Alcohol Use: No     Comment: 12/21/2014 "I drink on a rare occasion"    Review of Systems  All other systems reviewed and are negative.     Allergies  Sulfa antibiotics  Home Medications   Prior to Admission medications   Medication Sig Start Date End Date Taking? Authorizing Provider  amphetamine-dextroamphetamine (ADDERALL) 20 MG tablet Take 2 tablets (40 mg total) by mouth 2 (two) times daily. 05/26/15  Yes Veryl Speak, FNP  doxepin (SINEQUAN) 100 MG capsule Take 1 capsule (100 mg total) by mouth at bedtime. 05/26/15  Yes Veryl Speak, FNP  FREESTYLE LITE test strip TEST BLOOD SUGARS 3 TIMES DAILY  AND WHEN NEEDED 03/29/15  Yes Historical Provider, MD  insulin aspart protamine - aspart (NOVOLOG 70/30 MIX) (70-30) 100 UNIT/ML FlexPen Inject 0.8 mLs (80 Units total) into the skin 2 (two) times daily. 05/26/15  Yes Veryl Speak, FNP  Lancets (FREESTYLE) lancets TEST 3 TIMES DAILY AND WHEN NEEDED 03/29/15  Yes Historical Provider, MD  QUEtiapine (SEROQUEL) 100 MG tablet Take 1 tablet (100 mg total) by mouth 2 (two) times daily. 05/26/15  Yes Veryl Speak, FNP  traZODone (DESYREL) 100 MG tablet Take 2 tablets (200 mg total) by mouth at bedtime. 05/26/15  Yes Veryl Speak, FNP   BP 125/85 mmHg  Pulse 110  Temp(Src) 97.7 F (36.5 C) (Oral)  Resp 26  SpO2 94% Physical Exam  Constitutional: He is oriented to person, place, and time. He appears well-developed and well-nourished.  HENT:  Head: Normocephalic and atraumatic.  Eyes: EOM are normal.  Neck: Normal range of motion.  Cardiovascular: Normal rate, regular rhythm, normal heart sounds and intact distal pulses.   Pulmonary/Chest: Effort normal and breath sounds normal. No respiratory distress.  Abdominal: Soft. He exhibits no distension. There is no tenderness.  Musculoskeletal: Normal range of motion.  Neurological: He is alert and oriented to person, place, and time.  Skin: Skin is warm and dry.  Psychiatric: He has a normal mood and affect. Judgment normal.  Nursing note and vitals reviewed.   ED Course  Procedures (including critical care time)  CRITICAL CARE Performed by: Lyanne Co Total critical care time: 33 Critical care time was exclusive of separately billable procedures and treating other patients. Critical care was necessary to treat or prevent imminent or life-threatening deterioration. Critical care was time spent personally by me on the following activities: development of treatment plan with patient and/or surrogate as well as nursing, discussions with consultants, evaluation of patient's response to  treatment, examination of patient, obtaining history from patient or surrogate, ordering and performing treatments and interventions, ordering and review of laboratory studies, ordering and review of radiographic studies, pulse oximetry and re-evaluation of patient's condition.  Labs Review Labs Reviewed  CBC - Abnormal; Notable for the following:    WBC 12.5 (*)    RBC 5.88 (*)    Hemoglobin 17.7 (*)    All other components within normal limits  BASIC METABOLIC PANEL - Abnormal; Notable for the following:    Sodium 133 (*)    Chloride 100 (*)    CO2 10 (*)    Glucose, Bld 354 (*)    Anion gap 23 (*)    All other components within normal limits  CBG MONITORING, ED - Abnormal; Notable for the following:    Glucose-Capillary 335 (*)    All other components within normal limits    Imaging Review No results found.   EKG Interpretation None      MDM   Final diagnoses:  Diabetic ketoacidosis without coma associated with type 1 diabetes mellitus  H/O medication noncompliance    Patient with evidence of diabetic ketoacidosis with a bicarbonate 10 and anion gap 23.  Patient will be started on IV fluids and insulin drip at this time.  Admit to the hospital.    Azalia Bilis, MD 06/01/15 1315

## 2015-06-02 DIAGNOSIS — E871 Hypo-osmolality and hyponatremia: Secondary | ICD-10-CM

## 2015-06-02 DIAGNOSIS — E1010 Type 1 diabetes mellitus with ketoacidosis without coma: Principal | ICD-10-CM

## 2015-06-02 DIAGNOSIS — R112 Nausea with vomiting, unspecified: Secondary | ICD-10-CM

## 2015-06-02 LAB — BASIC METABOLIC PANEL
ANION GAP: 5 (ref 5–15)
ANION GAP: 5 (ref 5–15)
ANION GAP: 4 — AB (ref 5–15)
Anion gap: 7 (ref 5–15)
Anion gap: 4 — ABNORMAL LOW (ref 5–15)
BUN: 12 mg/dL (ref 6–20)
BUN: 11 mg/dL (ref 6–20)
BUN: 9 mg/dL (ref 6–20)
BUN: 8 mg/dL (ref 6–20)
BUN: 6 mg/dL (ref 6–20)
CALCIUM: 8.3 mg/dL — AB (ref 8.9–10.3)
CALCIUM: 8 mg/dL — AB (ref 8.9–10.3)
CALCIUM: 8.4 mg/dL — AB (ref 8.9–10.3)
CHLORIDE: 111 mmol/L (ref 101–111)
CHLORIDE: 109 mmol/L (ref 101–111)
CO2: 14 mmol/L — AB (ref 22–32)
CO2: 15 mmol/L — ABNORMAL LOW (ref 22–32)
CO2: 18 mmol/L — ABNORMAL LOW (ref 22–32)
CO2: 18 mmol/L — AB (ref 22–32)
CO2: 21 mmol/L — ABNORMAL LOW (ref 22–32)
CREATININE: 0.65 mg/dL (ref 0.61–1.24)
CREATININE: 0.56 mg/dL — AB (ref 0.61–1.24)
Calcium: 7.9 mg/dL — ABNORMAL LOW (ref 8.9–10.3)
Calcium: 7.6 mg/dL — ABNORMAL LOW (ref 8.9–10.3)
Chloride: 112 mmol/L — ABNORMAL HIGH (ref 101–111)
Chloride: 110 mmol/L (ref 101–111)
Chloride: 110 mmol/L (ref 101–111)
Creatinine, Ser: 0.69 mg/dL (ref 0.61–1.24)
Creatinine, Ser: 0.72 mg/dL (ref 0.61–1.24)
Creatinine, Ser: 0.52 mg/dL — ABNORMAL LOW (ref 0.61–1.24)
GFR calc Af Amer: >60 mL/min (ref >60)
GFR calc Af Amer: >60 mL/min (ref >60)
GFR calc Af Amer: >60 mL/min (ref >60)
GFR calc Af Amer: >60 mL/min (ref >60)
GFR calc non Af Amer: >60 mL/min (ref >60)
GLUCOSE: 91 mg/dL (ref 65–99)
GLUCOSE: 203 mg/dL — AB (ref 65–99)
GLUCOSE: 176 mg/dL — AB (ref 65–99)
Glucose, Bld: 130 mg/dL — ABNORMAL HIGH (ref 65–99)
Glucose, Bld: 119 mg/dL — ABNORMAL HIGH (ref 65–99)
POTASSIUM: 4 mmol/L (ref 3.5–5.1)
POTASSIUM: 3.4 mmol/L — AB (ref 3.5–5.1)
Potassium: 3.8 mmol/L (ref 3.5–5.1)
Potassium: 3.3 mmol/L — ABNORMAL LOW (ref 3.5–5.1)
Potassium: 3.6 mmol/L (ref 3.5–5.1)
SODIUM: 131 mmol/L — AB (ref 135–145)
SODIUM: 135 mmol/L (ref 135–145)
Sodium: 133 mmol/L — ABNORMAL LOW (ref 135–145)
Sodium: 132 mmol/L — ABNORMAL LOW (ref 135–145)
Sodium: 132 mmol/L — ABNORMAL LOW (ref 135–145)

## 2015-06-02 LAB — CBC
HEMATOCRIT: 34.7 % — AB (ref 39.0–52.0)
HEMOGLOBIN: 12.3 g/dL — AB (ref 13.0–17.0)
MCH: 29.6 pg (ref 26.0–34.0)
MCHC: 35.4 g/dL (ref 30.0–36.0)
MCV: 83.4 fL (ref 78.0–100.0)
Platelets: 224 10*3/uL (ref 150–400)
RBC: 4.16 MIL/uL — ABNORMAL LOW (ref 4.22–5.81)
RDW: 12.8 % (ref 11.5–15.5)
WBC: 5.9 10*3/uL (ref 4.0–10.5)

## 2015-06-02 LAB — GLUCOSE, CAPILLARY
GLUCOSE-CAPILLARY: 127 mg/dL — AB (ref 65–99)
GLUCOSE-CAPILLARY: 135 mg/dL — AB (ref 65–99)
GLUCOSE-CAPILLARY: 140 mg/dL — AB (ref 65–99)
GLUCOSE-CAPILLARY: 160 mg/dL — AB (ref 65–99)
GLUCOSE-CAPILLARY: 164 mg/dL — AB (ref 65–99)
GLUCOSE-CAPILLARY: 212 mg/dL — AB (ref 65–99)
GLUCOSE-CAPILLARY: 97 mg/dL (ref 65–99)
Glucose-Capillary: 139 mg/dL — ABNORMAL HIGH (ref 65–99)
Glucose-Capillary: 146 mg/dL — ABNORMAL HIGH (ref 65–99)
Glucose-Capillary: 172 mg/dL — ABNORMAL HIGH (ref 65–99)
Glucose-Capillary: 202 mg/dL — ABNORMAL HIGH (ref 65–99)
Glucose-Capillary: 191 mg/dL — ABNORMAL HIGH (ref 65–99)
Glucose-Capillary: 202 mg/dL — ABNORMAL HIGH (ref 65–99)
Glucose-Capillary: 166 mg/dL — ABNORMAL HIGH (ref 65–99)
Glucose-Capillary: 126 mg/dL — ABNORMAL HIGH (ref 65–99)
Glucose-Capillary: 156 mg/dL — ABNORMAL HIGH (ref 65–99)

## 2015-06-02 LAB — HEMOGLOBIN A1C
HEMOGLOBIN A1C: 11.9 % — AB (ref 4.8–5.6)
Mean Plasma Glucose: 295 mg/dL

## 2015-06-02 MED ORDER — POTASSIUM CHLORIDE 10 MEQ/100ML IV SOLN
10.0000 meq | INTRAVENOUS | Status: AC
  Administered 2015-06-02 (×4): 10 meq via INTRAVENOUS
  Filled 2015-06-02 (×4): qty 100

## 2015-06-02 MED ORDER — DEXTROSE 5 % IV SOLN
INTRAVENOUS | Status: DC
  Administered 2015-06-02: 01:00:00 via INTRAVENOUS

## 2015-06-02 MED ORDER — DEXTROSE 10 % IV SOLN
INTRAVENOUS | Status: DC
  Administered 2015-06-02 (×2): via INTRAVENOUS
  Filled 2015-06-02 (×3): qty 1000

## 2015-06-02 MED ORDER — SODIUM CHLORIDE 0.9 % IV BOLUS (SEPSIS)
1000.0000 mL | Freq: Once | INTRAVENOUS | Status: AC
  Administered 2015-06-02: 1000 mL via INTRAVENOUS

## 2015-06-02 MED ORDER — POTASSIUM CHLORIDE CRYS ER 20 MEQ PO TBCR
40.0000 meq | EXTENDED_RELEASE_TABLET | Freq: Once | ORAL | Status: AC
  Administered 2015-06-02: 40 meq via ORAL
  Filled 2015-06-02: qty 2

## 2015-06-02 MED ORDER — POTASSIUM CHLORIDE 10 MEQ/100ML IV SOLN
INTRAVENOUS | Status: AC
  Filled 2015-06-02: qty 100

## 2015-06-02 MED ORDER — INSULIN ASPART 100 UNIT/ML ~~LOC~~ SOLN
0.0000 [IU] | Freq: Three times a day (TID) | SUBCUTANEOUS | Status: DC
  Administered 2015-06-02: 2 [IU] via SUBCUTANEOUS
  Administered 2015-06-03: 3 [IU] via SUBCUTANEOUS
  Administered 2015-06-03: 8 [IU] via SUBCUTANEOUS

## 2015-06-02 MED ORDER — SODIUM CHLORIDE 0.9 % IV SOLN
INTRAVENOUS | Status: DC
  Administered 2015-06-02 – 2015-06-03 (×2): via INTRAVENOUS

## 2015-06-02 MED ORDER — POLYVINYL ALCOHOL 1.4 % OP SOLN
1.0000 [drp] | OPHTHALMIC | Status: DC | PRN
  Administered 2015-06-02: 1 [drp] via OPHTHALMIC
  Filled 2015-06-02: qty 15

## 2015-06-02 MED ORDER — INSULIN ASPART 100 UNIT/ML ~~LOC~~ SOLN
0.0000 [IU] | Freq: Every day | SUBCUTANEOUS | Status: DC
  Administered 2015-06-02: 2 [IU] via SUBCUTANEOUS

## 2015-06-02 MED ORDER — SODIUM CHLORIDE 0.9 % IV BOLUS (SEPSIS)
500.0000 mL | Freq: Once | INTRAVENOUS | Status: AC
  Administered 2015-06-02: 500 mL via INTRAVENOUS

## 2015-06-02 MED ORDER — INSULIN ASPART PROT & ASPART (70-30 MIX) 100 UNIT/ML ~~LOC~~ SUSP
32.0000 [IU] | Freq: Two times a day (BID) | SUBCUTANEOUS | Status: DC
  Administered 2015-06-02: 32 [IU] via SUBCUTANEOUS
  Filled 2015-06-02: qty 10

## 2015-06-02 NOTE — Progress Notes (Signed)
PROGRESS NOTE    [name] MWU:[id number] DOB: 1986-01-27 DOA: 06/01/2015 PCP: Jeanine Luz, FNP  HPI/Brief narrative 29 year old male patient with history of type I DM-diagnosed 7 years ago, supposed to be on 70/30 insulin (40 units twice a day) but noncompliant for last 2 weeks, depression/schizoaffective disorder, GERD, HTN and polysubstance abuse, Hospital on 06/01/15 with progressive nausea, nonbloody emesis, abdominal pain and noted to be in DKA. Admitted to stepdown unit for close monitoring and management.   Assessment/Plan:  DKA in poorly controlled type I DM - Precipitated by medication noncompliance - Admitted to stepdown unit, hydrated aggressively and placed on insulin drip area BMPs closely monitored. - Anion gap has closed. CBGs are reasonable in the 127-164 range. Bicarbonate has gradually improved to 18 but not fully normal yet. - We will transition to NovoLog mix 70/30 insulin later this afternoon and DC insulin drip. Add NovoLog moderate SSI while inpatient. Titrate 70/30 insulin as needed. Continue IV fluids. Start diabetic diet along with maintenance insulins. - Diabetes coordinator input appreciated. Discussed with them. - A1c: 11.9 (has been greater than 11 every time it has been checked since 2013)  Nausea and vomiting - Likely secondary to DKA. Resolved. Resume diet.  Schizophrenia - No hallucinations, delusions, suicidal or homicidal ideations - Continue home medication regimen. - Do not know if his psychiatric condition is contributing to his noncompliance too.  Dehydration with hyponatremia - Improved after IV fluids. Continue IV fluids for additional 24 hours.  Noncompliance - Counseled extensively  GERD - PPI  Hypertension - Controlled off medications. Monitor   DVT prophylaxis: Subcutaneous Lovenox Code Status: Full Family Communication: None at bedside Disposition Plan: Transfer to medical floor. Possible discharge home  6/16.   Consultants:  Diabetes coordinator  Procedures:  None  Antibiotics:  None   Subjective: Denies complaints. Denies nausea or vomiting. Asking for something to eat.  Objective: Filed Vitals:   06/02/15 0800 06/02/15 0900 06/02/15 1200 06/02/15 1203  BP: 86/51 96/60  125/61  Pulse: 92 90  103  Temp:   98.4 F (36.9 C)   TempSrc:   Oral   Resp: 10 14  25   Height: 6\' 1"  (1.854 m)     Weight:      SpO2: 100% 100%  100%    Intake/Output Summary (Last 24 hours) at 06/02/15 1423 Last data filed at 06/02/15 0617  Gross per 24 hour  Intake 3440.45 ml  Output    900 ml  Net 2540.45 ml   Filed Weights   06/02/15 0400  Weight: 71.9 kg (158 lb 8.2 oz)     Exam:  General exam: Pleasant young male lying comfortably in bed. Respiratory system: Clear. No increased work of breathing. Cardiovascular system: S1 & S2 heard, RRR. No JVD, murmurs, gallops, clicks or pedal edema. Telemetry: Sinus rhythm-ST in 100s. Gastrointestinal system: Abdomen is nondistended, soft and nontender. Normal bowel sounds heard. Central nervous system: Alert and oriented. No focal neurological deficits. Extremities: Symmetric 5 x 5 power. Psychiatry: Pleasant and responds appropriately to questions.   Data Reviewed: Basic Metabolic Panel:  Recent Labs Lab 06/01/15 1737 06/01/15 2328 06/02/15 0329 06/02/15 0823 06/02/15 1212  NA 133* 133* 131* 132* 132*  K 3.9 3.8 4.0 3.3* 3.6  CL 106 112* 111 110 109  CO2 9* 14* 15* 18* 18*  GLUCOSE 353* 91 203* 176* 130*  BUN 17 12 11 9 8   CREATININE 0.94  0.81 0.69 0.72 0.65 0.56*  CALCIUM 8.1* 7.9* 7.6*  8.3* 8.0*   Liver Function Tests: No results for input(s): AST, ALT, ALKPHOS, BILITOT, PROT, ALBUMIN in the last 168 hours. No results for input(s): LIPASE, AMYLASE in the last 168 hours. No results for input(s): AMMONIA in the last 168 hours. CBC:  Recent Labs Lab 06/01/15 1135 06/01/15 1737 06/02/15 0329  WBC 12.5* 9.7 5.9  HGB  17.7* 15.5 12.3*  HCT 49.9 43.4 34.7*  MCV 84.9 84.1 83.4  PLT 377 320 224   Cardiac Enzymes: No results for input(s): CKTOTAL, CKMB, CKMBINDEX, TROPONINI in the last 168 hours. BNP (last 3 results) No results for input(s): PROBNP in the last 8760 hours. CBG:  Recent Labs Lab 06/02/15 0828 06/02/15 0930 06/02/15 1030 06/02/15 1140 06/02/15 1345  GLUCAP 166* 127* 135* 126* 164*    Recent Results (from the past 240 hour(s))  MRSA PCR Screening     Status: None   Collection Time: 06/01/15  4:15 PM  Result Value Ref Range Status   MRSA by PCR NEGATIVE NEGATIVE Final    Comment:        The GeneXpert MRSA Assay (FDA approved for NASAL specimens only), is one component of a comprehensive MRSA colonization surveillance program. It is not intended to diagnose MRSA infection nor to guide or monitor treatment for MRSA infections.          Studies: No results found.      Scheduled Meds: . amphetamine-dextroamphetamine  30 mg Oral BID  . doxepin  100 mg Oral QHS  . enoxaparin (LOVENOX) injection  40 mg Subcutaneous Q24H  . insulin aspart  0-15 Units Subcutaneous TID WC  . insulin aspart  0-5 Units Subcutaneous QHS  . insulin aspart protamine- aspart  32 Units Subcutaneous BID WC  . pantoprazole (PROTONIX) IV  40 mg Intravenous Daily  . QUEtiapine  100 mg Oral BID  . traZODone  200 mg Oral QHS   Continuous Infusions: . dextrose 100 mL/hr at 06/02/15 1047  . insulin (NOVOLIN-R) infusion 2.1 Units/hr (06/02/15 1347)    Active Problems:   DKA (diabetic ketoacidoses)    Time spent: 45 minutes.    Marcellus Scott, MD, FACP, FHM. [facility] Pager [phone]  If 7PM-7AM, please contact night-coverage [email or web address] Password [email or web address] 06/02/2015, 2:23 PM    LOS: 1 day

## 2015-06-02 NOTE — Progress Notes (Addendum)
Inpatient Diabetes Program Recommendations  AACE/ADA: New Consensus Statement on Inpatient Glycemic Control (2013)  Target Ranges:  Prepandial:   less than 140 mg/dL      Peak postprandial:   less than 180 mg/dL (1-2 hours)      Critically ill patients:  140 - 180 mg/dL    Admit with: DKA  History: Type 1 Diabetes, Schizophrenia, Polysubstance Abuse  Home DM Meds: Novolog 70/30 Mix insulin- 40 units bidwc  Current DM Orders: IV Insulin drip per GlucoStabilizer    **Through Chart Review, found that patient established care with Jeanine Luz, FNP with [facility] on 05/26/15. Per those notes from that visit, patient admitted to not taking his insulin regularly. Mr. Carver Fila (NP with [facility]) refilled patient's 70/30 insulin Rx at this visit and patient was instructed to continue to take the 70/30 insulin at 40 units bidwc.  **Called [facility] office. Spoke with CMA for Jeanine Luz, FNP. CMA verified that 70/30 insulin dose should be 40 units bid with meals (80 units total per day).  **Patient is well known to the Inpatient Glycemic Control Team. He has multiple admissions and ED visits since 11/2014 (6 admissions and 5 ED visits- for both Psych issues and Hyperglycemia, Running out of insulin, etc).    MD- When patient is ready to transition off the IV insulin drip, please make sure patient receives at least 80% of his home dose of 70/30 insulin at least one hour before IV insulin drip stopped (32 units would be 80% of his 1st home dose of 70/30 insulin- Patient will need 70/30 insulin at supper time as well- 70/30 bid with breakfast and supper).  Recommend Novolog Moderate SSI as well at time of discontinuation of IV insulin drip as well.  Not sure what else to do for this patient.  He will take his insulin intermittently at home and note that patient has a history of psychiatric illness.  Note that patient claims he cannot afford his insulin.  Please  provide patient with a Rx for Novolin Reli-on 70/30 insulin at time of d/c [Order #[id number]].  Patient can purchase this particular brand of 70/30 insulin at [facility] for $25 per vial.    Will follow and assist with Insulin adjustments as needed. Ambrose Finland RN, MSN, CDE Diabetes Coordinator Inpatient Glycemic Control Team Team Pager: [phone] (8a-5p)

## 2015-06-02 NOTE — Progress Notes (Signed)
Pt CBG at 2306 was 91 causing the glucose stabilizer infusion rate to decrease to 0.6 ml/hr. MD made aware of rate per protocol and D51/2NS rate was changed to 150 cc/hr. The CBG taken at 0004 was 97 causing the infusion rate to be decreased again to 0.4. MD again was notified and the maintenance fluids were changed to D5 at 100 ml/hr. When the next CBG of 139 was entered into the glucometer, the glucose stabilizer said to stop the infusion. MD was notified and an order was added for D10 at 100. I received verbal orders to maintain the patient on D10 unitl the blood sugar is high enough to keep the glucose stabilizer going. Pt's CBG's were closely monitored and at 0205 the CBG was 146. When entered into the glucometer the drip rate was set to 0.9. MD was notified of drip rate and pt remains on D10 per order. Will continue to monitor pt closely.

## 2015-06-02 NOTE — Progress Notes (Signed)
Initial Nutrition Assessment  INTERVENTION:  Once diet advanced, add Glucerna Shake po BID, each supplement provides 220 kcal and 10 grams of protein  RD will continue to monitor  NUTRITION DIAGNOSIS:  Inadequate oral intake related to inability to eat as evidenced by NPO status.  GOAL:  Patient will meet greater than or equal to 90% of their needs  MONITOR:  Diet advancement, Labs, Weight trends  REASON FOR ASSESSMENT:  Malnutrition Screening Tool   ASSESSMENT: 29 yo male with known DM type I, insulin dependent, presented to [facility] ED with main concern of several days duration of progressively worsening nausea and non bloody vomiting, abd pain, located in epigastric area, constant and throbbing, non radiating, 7/10 in severity, worse with eating and with no specific alleviating factors, associated with poor oral intake. Pt has denies fevers, chills, recent sick contacts or exposures.  In ED, pt appeared tired and weak, noted several episodes of vomiting. Per blood work, pt noted to be in DKA  Pt with 7 lb wt loss in unknown time frame.  He reports that he was eating poorly for a few days prior to admission.  Likes Glucerna Shakes. Feels hungry and asking for food.  Pt able to discuss carb modified diet.  Pt with multiple admissions for psych issues and hyperglycemia. Pt often runs out of insulin- takes intermittently at home. Pt claims he is unable to afford insulin.  RD will continue to monitor for further nutritional needs. Will order supplements once diet advanced.  Labs and medications reviewed.  CBGs 126-135  Na 132  Height:  Ht Readings from Last 1 Encounters:  06/02/15 6\' 1"  (1.854 m)    Weight:  Wt Readings from Last 1 Encounters:  06/02/15 158 lb 8.2 oz (71.9 kg)    Ideal Body Weight:  83.6 kg  Wt Readings from Last 10 Encounters:  06/02/15 158 lb 8.2 oz (71.9 kg)  05/26/15 155 lb (70.308 kg)  05/17/15 160 lb (72.576 kg)  03/29/15 152 lb 9.6 oz (69.219  kg)  03/19/15 189 lb 9.5 oz (86 kg)  01/31/15 132 lb 11.2 oz (60.192 kg)  01/24/15 143 lb 11.8 oz (65.2 kg)  12/24/14 151 lb (68.493 kg)  12/21/14 149 lb 1.6 oz (67.631 kg)  12/15/14 145 lb (65.772 kg)    BMI:  Body mass index is 20.92 kg/(m^2).  Estimated Nutritional Needs:  Kcal:  1900-2100  Protein:  95-110 g  Fluid:  2.0 L/day  Skin:  Reviewed, no issues  Diet Order:   NPO  EDUCATION NEEDS:  Education needs addressed   Intake/Output Summary (Last 24 hours) at 06/02/15 1307 Last data filed at 06/02/15 0617  Gross per 24 hour  Intake 3440.45 ml  Output    900 ml  Net 2540.45 ml    Last BM:  Prior to admission  06/04/15 MS, RD, LDN [phone]

## 2015-06-03 DIAGNOSIS — E860 Dehydration: Secondary | ICD-10-CM

## 2015-06-03 DIAGNOSIS — Z9114 Patient's other noncompliance with medication regimen: Secondary | ICD-10-CM

## 2015-06-03 LAB — GLUCOSE, CAPILLARY
GLUCOSE-CAPILLARY: 144 mg/dL — AB (ref 65–99)
Glucose-Capillary: 183 mg/dL — ABNORMAL HIGH (ref 65–99)
Glucose-Capillary: 267 mg/dL — ABNORMAL HIGH (ref 65–99)
Glucose-Capillary: 183 mg/dL — ABNORMAL HIGH (ref 65–99)

## 2015-06-03 LAB — BASIC METABOLIC PANEL
Anion gap: 4 — ABNORMAL LOW (ref 5–15)
BUN: 7 mg/dL (ref 6–20)
CO2: 22 mmol/L (ref 22–32)
Calcium: 8.2 mg/dL — ABNORMAL LOW (ref 8.9–10.3)
Chloride: 112 mmol/L — ABNORMAL HIGH (ref 101–111)
Creatinine, Ser: 0.55 mg/dL — ABNORMAL LOW (ref 0.61–1.24)
GFR calc Af Amer: >60 mL/min (ref >60)
GFR calc non Af Amer: >60 mL/min (ref >60)
GLUCOSE: 256 mg/dL — AB (ref 65–99)
POTASSIUM: 3.6 mmol/L (ref 3.5–5.1)
Sodium: 138 mmol/L (ref 135–145)

## 2015-06-03 MED ORDER — INSULIN ASPART PROT & ASPART (70-30 MIX) 100 UNIT/ML ~~LOC~~ SUSP
40.0000 [IU] | Freq: Two times a day (BID) | SUBCUTANEOUS | Status: DC
  Administered 2015-06-03: 40 [IU] via SUBCUTANEOUS
  Filled 2015-06-03: qty 10

## 2015-06-03 MED ORDER — INSULIN NPH ISOPHANE & REGULAR (70-30) 100 UNIT/ML ~~LOC~~ SUSP: 40.0000 [IU] | Freq: Two times a day (BID) | SUBCUTANEOUS | Status: DC

## 2015-06-03 MED ORDER — "INSULIN SYRINGE-NEEDLE U-100 29G X 1/2"" 0.3 ML MISC": Status: DC

## 2015-06-03 NOTE — Discharge Instructions (Signed)
Diabetic Ketoacidosis °Diabetic ketoacidosis (DKA) is a life-threatening complication of type 1 diabetes. It must be quickly recognized and treated. Treatment requires hospitalization. °CAUSES  °When there is no insulin in the body, glucose (sugar) cannot be used, and the body breaks down fat for energy. When fat breaks down, acids (ketones) build up in the blood. Very high levels of glucose and high levels of acids lead to severe loss of body fluids (dehydration) and other dangerous chemical changes. This stresses your vital organs and can cause coma or death. °SIGNS AND SYMPTOMS  °· Tiredness (fatigue). °· Weight loss. °· Excessive thirst. °· Ketones in your urine. °· Light-headedness. °· Fruity or sweet smelling breath. °· Excessive urination. °· Visual changes. °· Confusion or irritability. °· Nausea or vomiting. °· Rapid breathing. °· Stomachache or abdominal pain. °DIAGNOSIS  °Your health care provider will diagnose DKA based on your history, physical exam, and blood tests. The health care provider will check to see if you have another illness that caused you to go into DKA. Most of this will be done quickly in an emergency room. °TREATMENT  °· Fluid replacement to correct dehydration. °· Insulin. °· Correction of electrolytes, such as potassium and sodium. °· Antibiotic medicines. °PREVENTION °· Always take your insulin. Do not skip your insulin injections. °· If you are sick, treat yourself quickly. Your body often needs more insulin to fight the illness. °· Check your blood glucose regularly. °· Check urine ketones if your blood glucose is greater than 240 milligrams per deciliter (mg/dL). °· Do not use outdated (expired) insulin. °· If your blood glucose is high, drink plenty of fluids. This helps flush out ketones. °HOME CARE INSTRUCTIONS  °· If you are sick, follow the advice of your health care provider. °· To prevent dehydration, drink enough water and fluids to keep your urine clear or pale  yellow. °¨ If you cannot eat, alternate between drinking fluids with sugar (soda, juices, flavored gelatin) and salty fluids (broth, bouillon). °¨ If you can eat, follow your usual diet and drink sugar-free liquids (water, diet drinks). °· Always take your usual dose of insulin. If you cannot eat or if your glucose is getting too low, call your health care provider for further instructions. °· Continue to monitor your blood or urine ketones every 3-4 hours around the clock. Set your alarm clock or have someone wake you up. If you are too sick, have someone test it for you. °· Rest and avoid exercise. °SEEK MEDICAL CARE IF:  °· You have a fever. °· You have ketones in your urine, or your blood glucose is higher than a level your health care provider suggests. You may need extra insulin. Call your health care provider if you need advice on adjusting your insulin. °· You cannot drink at least a tablespoon (15 mL) of fluid every 15-20 minutes. °· You have been vomiting for more than 2 hours. °· You have symptoms of DKA: °¨ Fruity smelling breath. °¨ Breathing faster or slower. °¨ Becoming very sleepy. °SEEK IMMEDIATE MEDICAL CARE IF:  °· You have signs of dehydration: °¨ Decreased urination. °¨ Increased thirst. °¨ Dry skin and mouth. °¨ Light-headedness. °· Your blood glucose is very high (as advised by your health care provider) twice in a row. °· You faint. °· You have chest pain or trouble breathing. °· You have a sudden, severe headache. °· You have sudden weakness in one arm or one leg. °· You have sudden trouble speaking or swallowing. °· You   have vomiting or diarrhea that is getting worse after 3 hours. °· You have abdominal pain. °MAKE SURE YOU:  °· Understand these instructions. °· Will watch your condition. °· Will get help right away if you are not doing well or get worse. °Document Released: 12/01/2000 Document Revised: 12/09/2013 Document Reviewed: 06/09/2009 °ExitCare® Patient Information ©2015 [employer]  [employer]. This information is not intended to replace advice given to you by your health care provider. Make sure you discuss any questions you have with your health care provider. ° °

## 2015-06-03 NOTE — Care Management Note (Signed)
Case Management Note  Patient Details  Name: [name] MRN: [id number] Date of Birth: [birth year]  Subjective/Objective:      29 yo male admitted with DKA              Action/Plan: Spoke with patient and he stated that he is unable to afford his mediations. Discussed the [facility] with the patient and he stated that he has been a patient at the clinic in the past couple of months but started going to a different PCP because the clinic was not able to prescribe and fill all of his meds. He stated that when he was connected to the [facility] he had access to his medications for DM. Discussed reconnecting the patient to the [facility] for access and patient was agreeable. Appointment scheduled at the [facility] and the Clinic stated that since the patient is established he can bring his dc prescriptions except for narcotics. Patient provided above information and appointment information is listed on the follow up instructions.  Expected Discharge Date:   (unknown)               Expected Discharge Plan:  Home/Self Care  In-House Referral:     Discharge planning Services  CM Consult  Post Acute Care Choice:    Choice offered to:     DME Arranged:    DME Agency:     HH Arranged:    HH Agency:     Status of Service:  Completed, signed off  Medicare Important Message Given:    Date Medicare IM Given:    Medicare IM give by:    Date Additional Medicare IM Given:    Additional Medicare Important Message give by:     If discussed at Long Length of Stay Meetings, dates discussed:    Additional Comments:  Gerrit Halls, RN 06/03/2015, 10:52 AM

## 2015-06-03 NOTE — Discharge Summary (Signed)
Physician Discharge Summary  [name] ZMO:[id number] DOB: [birth year] DOA: 06/01/2015  PCP: Jeanine Luz, FNP  Admit date: 06/01/2015 Discharge date: 06/03/2015  Time spent: Greater than 30 minutes  Recommendations for Outpatient Follow-up:  1. Jeanine Luz, PCP/FNP in 5 days. 2. [facility] on 06/28/15 at 2:15 PM.  Discharge Diagnoses:  Active Problems:   DKA (diabetic ketoacidoses)   Discharge Condition: Improved & Stable  Diet recommendation: Diabetic diet.  Filed Weights   06/02/15 0400 06/02/15 1822  Weight: 71.9 kg (158 lb 8.2 oz) 73.2 kg (161 lb 6 oz)    History of present illness:  29 year old male patient with history of type I DM-diagnosed 7 years ago, supposed to be on 70/30 insulin (40 units twice a day) but noncompliant for last 2 weeks, depression/schizoaffective disorder/schizophrenia, GERD, HTN and polysubstance abuse, Hospital on 06/01/15 with progressive nausea, nonbloody emesis, abdominal pain and noted to be in DKA. Admitted to stepdown unit for close monitoring and management.  Hospital Course:   DKA in poorly controlled type I DM - Admission labs: Sodium 133, potassium 4.4, chloride 100, bicarbonate 10, BUN 20, creatinine 1.17, glucose 354 and anion gap 23 - Precipitated by medication noncompliance - Admitted to stepdown unit, hydrated aggressively and placed on insulin drip area BMPs closely monitored. - Once anion gap closed, he was transitioned to NovoLog mix 70/30 insulin. Fasting blood sugar this morning 267 and patient's 70/30 insulin will be increased to his home dose of 40 units twice a day. - A1c: 11.9 (has been greater than 11 every time it has been checked since 2013) - DKA resolved. Patient will be discharged on prior home dose of 70/30 insulin. He has been counseled extensively regarding importance of compliance with diet, medications and M.D. follow-ups and he verbalizes understanding. Case  management has arranged for him to get his medications through the [facility] clinic. Patient wishes to continue follow-up at the [facility] clinic for diabetic medication needs as well as with his prior PCP regarding other medications.  Nausea and vomiting - Likely secondary to DKA. Resolved.   Schizophrenia - No hallucinations, delusions, suicidal or homicidal ideations - Continue home medication regimen. - Do not know if his psychiatric condition is contributing to his noncompliance too. - Patient's PCP manages his medications.  Dehydration with hyponatremia - Resolved after IV fluids  Noncompliance - Counseled extensively  GERD - PPI while hospitalized. Will not be discharged on same. Can be reassessed at outpatient visit.  Hypertension - Controlled off medications. Monitor   Consultants:  Diabetes coordinator  Procedures:  None   Discharge Exam:  Complaints: Denies complaints.  Filed Vitals:   06/02/15 1822 06/02/15 2137 06/03/15 0213 06/03/15 0543  BP: 114/70 110/69 110/80 105/76  Pulse: 99 91 85 96  Temp: 97.6 F (36.4 C) 98.1 F (36.7 C) 97.7 F (36.5 C) 97.6 F (36.4 C)  TempSrc: Oral Oral Oral Oral  Resp: 18 20 18 20   Height: 6\' 1"  (1.854 m)     Weight: 73.2 kg (161 lb 6 oz)     SpO2: 100% 100% 100% 98%    General exam: Pleasant young male lying comfortably in bed. Respiratory system: Clear. No increased work of breathing. Cardiovascular system: S1 & S2 heard, RRR. No JVD, murmurs, gallops, clicks or pedal edema.  Gastrointestinal system: Abdomen is nondistended, soft and nontender. Normal bowel sounds heard. Central nervous system: Alert and oriented. No focal neurological deficits. Extremities: Symmetric 5 x 5 power. Psychiatry:  Pleasant and responds appropriately to questions.  Discharge Instructions      Discharge Instructions    Activity as tolerated - No restrictions    Complete by:  As directed      Call MD for:  difficulty  breathing, headache or visual disturbances    Complete by:  As directed      Call MD for:  extreme fatigue    Complete by:  As directed      Call MD for:  persistant dizziness or light-headedness    Complete by:  As directed      Call MD for:  persistant nausea and vomiting    Complete by:  As directed      Call MD for:  severe uncontrolled pain    Complete by:  As directed      Diet Carb Modified    Complete by:  As directed             Medication List    STOP taking these medications        insulin aspart protamine - aspart (70-30) 100 UNIT/ML FlexPen  Commonly known as:  NOVOLOG 70/30 MIX      TAKE these medications        amphetamine-dextroamphetamine 20 MG tablet  Commonly known as:  ADDERALL  Take 2 tablets (40 mg total) by mouth 2 (two) times daily.     doxepin 100 MG capsule  Commonly known as:  SINEQUAN  Take 1 capsule (100 mg total) by mouth at bedtime.     freestyle lancets  TEST 3 TIMES DAILY AND WHEN NEEDED     FREESTYLE LITE test strip  Generic drug:  glucose blood  TEST BLOOD SUGARS 3 TIMES DAILY AND WHEN NEEDED     insulin NPH-regular Human (70-30) 100 UNIT/ML injection  Commonly known as:  NOVOLIN 70/30  Inject 40 Units into the skin 2 (two) times daily with a meal. [facility] has [employer] brand Insulin     Insulin Syringe-Needle U-100 29G X 1/2" 0.3 ML Misc  Commonly known as:  SAFETY-GLIDE 0.3CC SYR 29GX1/2  Use as directed.     QUEtiapine 100 MG tablet  Commonly known as:  SEROQUEL  Take 1 tablet (100 mg total) by mouth 2 (two) times daily.     traZODone 100 MG tablet  Commonly known as:  DESYREL  Take 2 tablets (200 mg total) by mouth at bedtime.       Follow-up Information    Follow up with [facility] On 06/28/2015.   Why:  appointment at 2:15   Contact information:   [address] [place] [address] [phone]      Follow up with Jeanine Luz, FNP. Schedule an appointment  as soon as possible for a visit in 5 days.   Specialty:  Family Medicine   Contact information:   [address] [place] [address] [phone]        The results of significant diagnostics from this hospitalization (including imaging, microbiology, ancillary and laboratory) are listed below for reference.    Significant Diagnostic Studies: No results found.  Microbiology: Recent Results (from the past 240 hour(s))  MRSA PCR Screening     Status: None   Collection Time: 06/01/15  4:15 PM  Result Value Ref Range Status   MRSA by PCR NEGATIVE NEGATIVE Final    Comment:        The GeneXpert MRSA Assay (FDA approved for NASAL specimens  only), is one component of a comprehensive MRSA colonization surveillance program. It is not intended to diagnose MRSA infection nor to guide or monitor treatment for MRSA infections.      Labs: Basic Metabolic Panel:  Recent Labs Lab 06/02/15 0329 06/02/15 0823 06/02/15 1212 06/02/15 1755 06/03/15 0500  NA 131* 132* 132* 135 138  K 4.0 3.3* 3.6 3.4* 3.6  CL 111 110 109 110 112*  CO2 15* 18* 18* 21* 22  GLUCOSE 203* 176* 130* 119* 256*  BUN 11 9 8 6 7   CREATININE 0.72 0.65 0.56* 0.52* 0.55*  CALCIUM 7.6* 8.3* 8.0* 8.4* 8.2*   Liver Function Tests: No results for input(s): AST, ALT, ALKPHOS, BILITOT, PROT, ALBUMIN in the last 168 hours. No results for input(s): LIPASE, AMYLASE in the last 168 hours. No results for input(s): AMMONIA in the last 168 hours. CBC:  Recent Labs Lab 06/01/15 1135 06/01/15 1737 06/02/15 0329  WBC 12.5* 9.7 5.9  HGB 17.7* 15.5 12.3*  HCT 49.9 43.4 34.7*  MCV 84.9 84.1 83.4  PLT 377 320 224   Cardiac Enzymes: No results for input(s): CKTOTAL, CKMB, CKMBINDEX, TROPONINI in the last 168 hours. BNP: BNP (last 3 results) No results for input(s): BNP in the last 8760 hours.  ProBNP (last 3 results) No results for input(s): PROBNP in the last 8760 hours.  CBG:  Recent Labs Lab  06/02/15 1445 06/02/15 1553 06/02/15 1710 06/03/15 0728 06/03/15 1127  GLUCAP 160* 183* 156* 267* 183*       Signed:  06/05/15, MD, FACP, FHM. [facility] Pager [phone]  If 7PM-7AM, please contact night-coverage [email or web address] Password [email or web address] 06/03/2015, 11:45 AM

## 2015-06-03 NOTE — Plan of Care (Signed)
Problem: Discharge Progression Outcomes Goal: Obtain signed CBG meter Rx form Outcome: Not Applicable Date Met:  46/27/03 Pt already has CBG meter

## 2015-06-09 NOTE — Patient Outreach (Signed)
[facility]) Care Management  06/09/2015  [name] Nov 16, 1986 [id number]   Referral received from [facility] and assigned to [name], RN, [name], [name], and [name], [name] for patient outreach.   [name] [name] Care Management Assistant [phone]

## 2015-06-10 ENCOUNTER — Telehealth: Payer: Self-pay | Admitting: Internal Medicine

## 2015-06-10 NOTE — Telephone Encounter (Signed)
Noncompliance noted

## 2015-06-10 NOTE — Telephone Encounter (Signed)
Continues to be non compliant.  Patient states he is not taking insulin, not dieting and not doing blood sugar.  Spoke with patient today and just started insulin.  Blood Sugar is 464.  Patient states he is fine and is not concerned.  Greg Calone Pt.

## 2015-06-11 ENCOUNTER — Ambulatory Visit: Payer: PPO | Admitting: Family

## 2015-06-16 ENCOUNTER — Ambulatory Visit: Payer: PPO | Admitting: Family Medicine

## 2015-06-16 ENCOUNTER — Other Ambulatory Visit: Payer: Self-pay

## 2015-06-16 DIAGNOSIS — Z0289 Encounter for other administrative examinations: Secondary | ICD-10-CM

## 2015-06-16 NOTE — Patient Outreach (Signed)
I tried to reach [name] by phone in regards to he medication non-adherence but was not able to leave a message.  I will try again in a couple of day.   [name], [name], [employer] [facility] consultant [phone]

## 2015-06-17 ENCOUNTER — Other Ambulatory Visit: Payer: Self-pay

## 2015-06-17 ENCOUNTER — Telehealth: Payer: Self-pay | Admitting: Family

## 2015-06-17 NOTE — Telephone Encounter (Signed)
Ann from [facility] has been following up on the patient and he is diabetic and extremely non-compliant on taking his medication. She has lost all contact with patient. His last blood sugar count was extremely high and she is wanting you to discuss this with him at his next appt. He visits the ER quite often about this and she is wanting you to get him healthy and on the right track.  Her number is [phone]

## 2015-06-17 NOTE — Patient Outreach (Signed)
[facility]) Care Management  06/17/2015  [name] 02/16/1986 [id number]   Telephone call to patient regarding [facility] referral.  Unable to reach patient or leave voice message due to mailbox being full.    Plan: RNCM will attempt 2nd telephone outreach to patient within 1 week.   [name] RN,BSN,CCM [facility] Telephonic Care Coordinator [phone]

## 2015-06-18 ENCOUNTER — Other Ambulatory Visit: Payer: Self-pay | Admitting: Pharmacist

## 2015-06-18 NOTE — Patient Outreach (Signed)
[name] was referred to pharmacy for medication non-adherence. Left a HIPAA compliant message on the patient's voicemail. If have not heard from patient by 06/23/15, will give him another call at that time.  Duanne Moron, [name] Clinical Pharmacist [facility] Care Management [phone]

## 2015-06-22 ENCOUNTER — Other Ambulatory Visit: Payer: Self-pay

## 2015-06-22 NOTE — Telephone Encounter (Signed)
Noted  

## 2015-06-22 NOTE — Patient Outreach (Signed)
[facility]) Care Management  06/22/2015  [name] [birth year] [id number]   Second telephone call to patient regarding [facility] referral. Unable to reach patient.  HIPAA compliant voice message left with call back phone number.   PLAN: RNCM will attempt 3rd telephone outreach to patient within 3 business days.   [name] RN,BSN,CCM [facility] Telephonic Care Coordinator [phone]

## 2015-06-23 ENCOUNTER — Encounter: Payer: Self-pay | Admitting: Family

## 2015-06-23 ENCOUNTER — Other Ambulatory Visit: Payer: Self-pay | Admitting: Pharmacist

## 2015-06-23 ENCOUNTER — Ambulatory Visit: Payer: Self-pay

## 2015-06-23 ENCOUNTER — Ambulatory Visit (INDEPENDENT_AMBULATORY_CARE_PROVIDER_SITE_OTHER): Payer: PPO | Admitting: Family

## 2015-06-23 VITALS — BP 110/80 | HR 123 | Temp 98.6°F | Resp 18 | Ht 73.0 in | Wt 154.1 lb

## 2015-06-23 DIAGNOSIS — F259 Schizoaffective disorder, unspecified: Secondary | ICD-10-CM | POA: Diagnosis not present

## 2015-06-23 DIAGNOSIS — E1065 Type 1 diabetes mellitus with hyperglycemia: Secondary | ICD-10-CM | POA: Diagnosis not present

## 2015-06-23 DIAGNOSIS — IMO0002 Reserved for concepts with insufficient information to code with codable children: Secondary | ICD-10-CM

## 2015-06-23 LAB — GLUCOSE, POCT (MANUAL RESULT ENTRY): POC Glucose: 110 mg/dl — AB (ref 70–99)

## 2015-06-23 MED ORDER — DOXEPIN HCL 100 MG PO CAPS
100.0000 mg | ORAL_CAPSULE | Freq: Every day | ORAL | Status: DC
Start: 2015-06-23 — End: 2015-07-22

## 2015-06-23 MED ORDER — AMPHETAMINE-DEXTROAMPHETAMINE 20 MG PO TABS: 40.0000 mg | ORAL_TABLET | Freq: Two times a day (BID) | ORAL | Status: DC

## 2015-06-23 MED ORDER — QUETIAPINE FUMARATE 100 MG PO TABS: 100.0000 mg | ORAL_TABLET | Freq: Two times a day (BID) | ORAL | Status: DC

## 2015-06-23 MED ORDER — TRAZODONE HCL 100 MG PO TABS: 200.0000 mg | ORAL_TABLET | Freq: Every day | ORAL | Status: DC

## 2015-06-23 NOTE — Assessment & Plan Note (Signed)
Currently managed with doxepin, quetiapine, and trazodone. Patient is more paranoid today and has flight of ideas during his discussions. Discussed importance of psychotherapy as part of his treatment regimen, patient refused at this time indicating he does not need a therapist. Medications refilled. Follow-up in one month to determine continued effectiveness.

## 2015-06-23 NOTE — Progress Notes (Signed)
Subjective:    Patient ID: [name], male    DOB: 1986/03/03, 29 y.o.   MRN: [id number]  Chief Complaint  Patient presents with  . Follow-up    HPI:  [name] is a 29 y.o. male with a PMH of Type 1 diabetes, non-compliance, hip bursitis, anxiety, depression and schizoaffective disorder who presents today for an office follow up.  1.) Type 1 Diabetes - Recently seen in the hospital for nausea, vomiting, and weakness with fatigue over several days. He was determined to have diabetic ketoacidosis. DKA was treated and resolved and patient was released taking Novolin N 70/3040 units twice daily. All hospital records reviewed in detail.   Reports that he feels good today with no nausea, vomiting, or fatigue. Indicates that he is taking his insulin as prescribed. Previous insulin that was prescribed was not affordable, therefore he did not take the medication. He also notes not notifying provider regarding the cost of medication. He does have a scheduled follow-up with [facility] in attempts for less costly treatment. Denies excessive thirst, hunger, or urination. Have received several notes from care management regarding patient non-compliance.  2.) Schizophrenia - Currently maintained on doxepin, quetiapine, and trazadone. Reports he takes the medications as prescribed and denies adverse side effects. Not currently seeing a therapist and indicates he does not believe he needs to see a therapist.. Denies any visual/auditory hallucinations.  Allergies  Allergen Reactions  . Sulfa Antibiotics Other (See Comments)    Unknown - Childhood    Current Outpatient Prescriptions on File Prior to Visit  Medication Sig Dispense Refill  . FREESTYLE LITE test strip TEST BLOOD SUGARS 3 TIMES DAILY AND WHEN NEEDED  0  . insulin NPH-regular Human (NOVOLIN 70/30) (70-30) 100 UNIT/ML injection Inject 40 Units into the skin 2 (two) times daily with a meal. [employer] has  [employer] brand Insulin 10 mL 0  . Insulin Syringe-Needle U-100 (SAFETY-GLIDE 0.3CC SYR 29GX1/2) 29G X 1/2" 0.3 ML MISC Use as directed. 200 each 0  . Lancets (FREESTYLE) lancets TEST 3 TIMES DAILY AND WHEN NEEDED  0  . [DISCONTINUED] citalopram (CELEXA) 20 MG tablet Take 20 mg by mouth daily.    . [DISCONTINUED] FLUoxetine (PROZAC) 20 MG tablet Take 20 mg by mouth every morning.    . [DISCONTINUED] insulin glargine (LANTUS) 100 UNIT/ML injection Inject 30 Units into the skin at bedtime. 10 mL 3  . [DISCONTINUED] insulin lispro (HUMALOG) 100 UNIT/ML injection Inject 10 Units into the skin 3 (three) times daily before meals.      . [DISCONTINUED] OLANZapine zydis (ZYPREXA) 10 MG disintegrating tablet Take 10 mg by mouth 2 (two) times daily.    . [DISCONTINUED] sertraline (ZOLOFT) 50 MG tablet Take 50 mg by mouth daily. For depression. Just started med, have not picked up yet [facility] [address]     No current facility-administered medications on file prior to visit.    Review of Systems  Respiratory: Negative for chest tightness and shortness of breath.   Cardiovascular: Negative for chest pain, palpitations and leg swelling.  Endocrine: Negative for polydipsia, polyphagia and polyuria.  Psychiatric/Behavioral: Positive for decreased concentration. Negative for suicidal ideas, hallucinations, sleep disturbance and self-injury. The patient is not nervous/anxious.       Objective:    BP 110/80 mmHg  Pulse 123  Temp(Src) 98.6 F (37 C) (Oral)  Resp 18  Ht 6\' 1"  (1.854 m)  Wt 154 lb 1.9 oz (69.908 kg)  BMI  20.34 kg/m2  SpO2 96% Nursing note and vital signs reviewed.  Physical Exam  Constitutional: He is oriented to person, place, and time. He appears well-developed and well-nourished. No distress.  Cardiovascular: Normal rate, regular rhythm, normal heart sounds and intact distal pulses.   Pulmonary/Chest: Effort normal and breath sounds normal.  Neurological: He is alert and  oriented to person, place, and time.  Skin: Skin is warm and dry.  Psychiatric: His behavior is normal. Judgment normal. His affect is labile. His speech is rapid and/or pressured. Thought content is paranoid.       Assessment & Plan:   Problem List Items Addressed This Visit      Endocrine   Diabetes type 1, uncontrolled - Primary    Type 1 diabetes remains uncontrolled, however office random glucose was 110. Discussed importance of taking his medication as prescribed and the risks associated with not taking his medication and high blood sugars. Patient verbally understands. Continue current dosage of Novolin N 70/30 at 40 units 2 times daily. Follow up with community [facility] and wellness as scheduled. Follow-up in one month or sooner if needed to check continued compliance and provide reinforcement. Discussed going to endocrinology, however patient was reluctant at this time.      Relevant Orders   POCT glucose (manual entry) (Completed)     Other   Schizoaffective disorder, unspecified type    Currently managed with doxepin, quetiapine, and trazodone. Patient is more paranoid today and has flight of ideas during his discussions. Discussed importance of psychotherapy as part of his treatment regimen, patient refused at this time indicating he does not need a therapist. Medications refilled. Follow-up in one month to determine continued effectiveness.      Relevant Medications   doxepin (SINEQUAN) 100 MG capsule   QUEtiapine (SEROQUEL) 100 MG tablet   traZODone (DESYREL) 100 MG tablet   amphetamine-dextroamphetamine (ADDERALL) 20 MG tablet

## 2015-06-23 NOTE — Patient Instructions (Addendum)
Please call regarding care management:  Duanne Moron, [name] Clinical Pharmacist [facility] Care Management [phone]  Thank you for choosing [facility].  Summary/Instructions:  Please continue to take your medications as prescribed.   Your prescription(s) have been submitted to your pharmacy or been printed and provided for you. Please take as directed and contact our office if you believe you are having problem(s) with the medication(s) or have any questions.  If your symptoms worsen or fail to improve, please contact our office for further instruction, or in case of emergency go directly to the emergency room at the closest medical facility.

## 2015-06-23 NOTE — Assessment & Plan Note (Signed)
Type 1 diabetes remains uncontrolled, however office random glucose was 110. Discussed importance of taking his medication as prescribed and the risks associated with not taking his medication and high blood sugars. Patient verbally understands. Continue current dosage of Novolin N 70/30 at 40 units 2 times daily. Follow up with community health and wellness as scheduled. Follow-up in one month or sooner if needed to check continued compliance and provide reinforcement. Discussed going to endocrinology, however patient was reluctant at this time.

## 2015-06-23 NOTE — Patient Outreach (Signed)
Second telephone call to patient regarding [facility] referral. Unable to reach patient. HIPAA compliant voice message left with call back phone number. If have not heard back from patient by 06/25/15, will call him again at that time.  Duanne Moron, [name] Clinical Pharmacist [facility] Care Management [phone]

## 2015-06-23 NOTE — Progress Notes (Signed)
Pre visit review using our clinic review tool, if applicable. No additional management support is needed unless otherwise documented below in the visit note. 

## 2015-06-24 ENCOUNTER — Telehealth: Payer: Self-pay

## 2015-06-24 ENCOUNTER — Other Ambulatory Visit: Payer: Self-pay | Admitting: *Deleted

## 2015-06-24 ENCOUNTER — Other Ambulatory Visit: Payer: Self-pay

## 2015-06-24 NOTE — Telephone Encounter (Signed)
Thurston Hole from [facility] Care Management left a VM requesting that at pts visit try to encourage him to interact with the transition of care team. She has not been able to get in touch with him and stated that he wasn't taking his insulin like he is supposed to or dieting. She also wants to know outcome of the visit.

## 2015-06-24 NOTE — Patient Outreach (Signed)
[facility]) Care Management  06/24/2015  [name] 10-29-1986 [id number]   Phone call to patient to provide resources for transportation.  HIPPA compliant voicemail left for a return call.    Adriana Reams [facility] Care Management [phone]

## 2015-06-24 NOTE — Patient Outreach (Signed)
[facility]) Care Management  06/24/2015  [name] 17-Jan-1986 [id number]  Third telephone attempt to patient.  Unable to reach patient.  HIPAA compliant voice message left with call back phone number.    PLAN;  RNCM will send patient outreach letter to attempt contact.   [name] RN,BSN,CCM [facility] Telephonic Care Coordinator [phone]

## 2015-06-24 NOTE — Telephone Encounter (Signed)
Spoke with Thurston Hole from [facility] Management regarding patient plan of care and ways to improve patient compliance with his medication regimen.

## 2015-06-25 ENCOUNTER — Encounter: Payer: Self-pay | Admitting: Pharmacist

## 2015-06-25 ENCOUNTER — Other Ambulatory Visit: Payer: Self-pay | Admitting: Pharmacist

## 2015-06-25 NOTE — Patient Outreach (Addendum)
[name] was referred to pharmacy for medication adherence. Left a third HIPAA compliant message on the patient's voicemail.  I will send patient outreach letter to attempt contact. If I have not heard back from [name] within 10 days, will close pharmacy episode.  Duanne Moron, [name] Clinical Pharmacist [facility] Care Management [phone]

## 2015-06-28 ENCOUNTER — Inpatient Hospital Stay: Payer: Self-pay | Admitting: Internal Medicine

## 2015-06-30 ENCOUNTER — Other Ambulatory Visit: Payer: Self-pay | Admitting: *Deleted

## 2015-06-30 NOTE — Patient Outreach (Signed)
[facility]) Care Management  06/30/2015  YUL DIANA Feb 19, 1986 [id number]   Phone call to patient to assist with transportation needs.  Voice mail message left for a return call.  This social worker spoke to [employer] who has been making attempts to reach patient as well and has not been successful. RNCM has sent contact letter to patient's home requesting a return call.     Adriana Reams [facility] Care Management [phone]

## 2015-07-01 ENCOUNTER — Encounter: Payer: Self-pay | Admitting: *Deleted

## 2015-07-01 ENCOUNTER — Other Ambulatory Visit: Payer: Self-pay | Admitting: *Deleted

## 2015-07-01 ENCOUNTER — Inpatient Hospital Stay: Payer: Self-pay | Admitting: Internal Medicine

## 2015-07-01 NOTE — Patient Outreach (Signed)
[facility]) Care Management  07/01/2015  [name] August 28, 1986 [id number]   Third outreach attempt.  Voicemail message left for a return call. Outreach letter sent.   Adriana Reams [facility] Care Management [phone]

## 2015-07-01 NOTE — Patient Outreach (Signed)
[facility]) Care Management  07/01/2015  AMELIA MACKEN 01/20/1986 [id number]   Three attempts made to contact patient. This Child psychotherapist unable to make contact.  Outreach letter mailed to patient's address listed.   Adriana Reams [facility] Care Management [phone]

## 2015-07-07 ENCOUNTER — Encounter: Payer: Self-pay | Admitting: Pharmacist

## 2015-07-07 NOTE — Patient Outreach (Signed)
[name] was referred to me for medication adherence. Made three phone call attempts and sent letter. No response within 10 days. Will now close pharmacy episode.  [name], [name] Clinical Pharmacist [facility] Care Management [phone]

## 2015-07-08 NOTE — Patient Outreach (Signed)
[facility]) Care Management  07/08/2015  [name] 1986-01-15 [id number]   No response from patient after 3 telephone attempts and outreach letter.  PLAN: RNCM will refer patient to Nena Polio to close due to inability to establish contact. RNCM will notify patients primary provider of closure.  [name] RN,BSN,CCM [facility] Telephonic Care Coordinator [phone]

## 2015-07-22 ENCOUNTER — Ambulatory Visit (INDEPENDENT_AMBULATORY_CARE_PROVIDER_SITE_OTHER): Payer: PPO | Admitting: Family

## 2015-07-22 ENCOUNTER — Encounter: Payer: Self-pay | Admitting: Family

## 2015-07-22 DIAGNOSIS — R51 Headache: Secondary | ICD-10-CM

## 2015-07-22 DIAGNOSIS — IMO0002 Reserved for concepts with insufficient information to code with codable children: Secondary | ICD-10-CM

## 2015-07-22 DIAGNOSIS — F259 Schizoaffective disorder, unspecified: Secondary | ICD-10-CM | POA: Diagnosis not present

## 2015-07-22 DIAGNOSIS — R519 Headache, unspecified: Secondary | ICD-10-CM | POA: Insufficient documentation

## 2015-07-22 DIAGNOSIS — E1065 Type 1 diabetes mellitus with hyperglycemia: Secondary | ICD-10-CM

## 2015-07-22 LAB — GLUCOSE, POCT (MANUAL RESULT ENTRY): POC Glucose: 434 mg/dl — AB (ref 70–99)

## 2015-07-22 MED ORDER — NAPROSYN 500 MG PO TABS: 500.0000 mg | ORAL_TABLET | Freq: Two times a day (BID) | ORAL | Status: DC

## 2015-07-22 MED ORDER — DOXEPIN HCL 100 MG PO CAPS: 100.0000 mg | ORAL_CAPSULE | Freq: Every day | ORAL | Status: DC

## 2015-07-22 MED ORDER — AMPHETAMINE-DEXTROAMPHETAMINE 20 MG PO TABS: 40.0000 mg | ORAL_TABLET | Freq: Two times a day (BID) | ORAL | Status: DC

## 2015-07-22 MED ORDER — TRAZODONE HCL 100 MG PO TABS: 200.0000 mg | ORAL_TABLET | Freq: Every day | ORAL | Status: DC

## 2015-07-22 MED ORDER — QUETIAPINE FUMARATE 100 MG PO TABS: 100.0000 mg | ORAL_TABLET | Freq: Two times a day (BID) | ORAL | Status: DC

## 2015-07-22 NOTE — Assessment & Plan Note (Signed)
Continues to be labile with current regimen. States that he takes the medications as prescribed. Discussed importance of following up with [facility] for continued care management and the importance of multidisciplinary approach. Continues to decline seeing psychiatry. Continue current dosages of trazodone, quetiapine, doxepin and adderall. Encouraged follow up with [facility].

## 2015-07-22 NOTE — Progress Notes (Signed)
Pre visit review using our clinic review tool, if applicable. No additional management support is needed unless otherwise documented below in the visit note. 

## 2015-07-22 NOTE — Assessment & Plan Note (Signed)
In office blood sugar elevated >400 secondary to patient not having medication. He forgot about his appointment with [facility]. Reports the medication is too expensive for him to afford. Discussed and emphasized importance of insulin regimen and follow up maintenance with [facility]. Follow up in 1 month.

## 2015-07-22 NOTE — Assessment & Plan Note (Signed)
Symptoms consistent with generalized/tension headache. Neurological exam is benign. Start naproxen as needed for headache. Follow up if symptoms worsen or fail to improve.

## 2015-07-22 NOTE — Patient Instructions (Signed)
Thank you for choosing [facility].  Summary/Instructions:  Your prescription(s) have been submitted to your pharmacy or been printed and provided for you. Please take as directed and contact our office if you believe you are having problem(s) with the medication(s) or have any questions.  If your symptoms worsen or fail to improve, please contact our office for further instruction, or in case of emergency go directly to the emergency room at the closest medical facility.    Tension Headache A tension headache is a feeling of pain, pressure, or aching often felt over the front and sides of the head. The pain can be dull or can feel tight (constricting). It is the most common type of headache. Tension headaches are not normally associated with nausea or vomiting and do not get worse with physical activity. Tension headaches can last 30 minutes to several days.  CAUSES  The exact cause is not known, but it may be caused by chemicals and hormones in the brain that lead to pain. Tension headaches often begin after stress, anxiety, or depression. Other triggers may include:  Alcohol.  Caffeine (too much or withdrawal).  Respiratory infections (colds, flu, sinus infections).  Dental problems or teeth clenching.  Fatigue.  Holding your head and neck in one position too long while using a computer. SYMPTOMS   Pressure around the head.   Dull, aching head pain.   Pain felt over the front and sides of the head.   Tenderness in the muscles of the head, neck, and shoulders. DIAGNOSIS  A tension headache is often diagnosed based on:   Symptoms.   Physical examination.   A CT scan or MRI of your head. These tests may be ordered if symptoms are severe or unusual. TREATMENT  Medicines may be given to help relieve symptoms.  HOME CARE INSTRUCTIONS   Only take over-the-counter or prescription medicines for pain or discomfort as directed by your caregiver.   Lie down in a  dark, quiet room when you have a headache.   Keep a journal to find out what may be triggering your headaches. For example, write down:  What you eat and drink.  How much sleep you get.  Any change to your diet or medicines.  Try massage or other relaxation techniques.   Ice packs or heat applied to the head and neck can be used. Use these 3 to 4 times per day for 15 to 20 minutes each time, or as needed.   Limit stress.   Sit up straight, and do not tense your muscles.   Quit smoking if you smoke.  Limit alcohol use.  Decrease the amount of caffeine you drink, or stop drinking caffeine.  Eat and exercise regularly.  Get 7 to 9 hours of sleep, or as recommended by your caregiver.  Avoid excessive use of pain medicine as recurrent headaches can occur.  SEEK MEDICAL CARE IF:   You have problems with the medicines you were prescribed.  Your medicines do not work.  You have a change from the usual headache.  You have nausea or vomiting. SEEK IMMEDIATE MEDICAL CARE IF:   Your headache becomes severe.  You have a fever.  You have a stiff neck.  You have loss of vision.  You have muscular weakness or loss of muscle control.  You lose your balance or have trouble walking.  You feel faint or pass out.  You have severe symptoms that are different from your first symptoms. MAKE SURE YOU:     Understand these instructions.  Will watch your condition.  Will get help right away if you are not doing well or get worse. Document Released: 12/04/2005 Document Revised: 02/26/2012 Document Reviewed: 11/24/2011 ExitCare Patient Information 2015 [employer]. This information is not intended to replace advice given to you by your health care provider. Make sure you discuss any questions you have with your health care provider.  

## 2015-07-22 NOTE — Progress Notes (Signed)
Subjective:    Patient ID: [name], male    DOB: 1986/10/24, 29 y.o.   MRN: [id number]  Chief Complaint  Patient presents with  . Medication Refill    headache for a week, tension in neck, shoulder pain, refill on everything    HPI:  [name] is a 29 y.o. male with a PMH of schizoaffective, type 1 diabetes, depression, and bilateral hip bursitis  who presents today for an office follow-up.    1.) Headache - Associated symptom of a headache with associated neck tightness has been going on for about a week. Describes the headache as sharp and that it is inbetween the bones.   2.) Blood sugars - Associated symptoms of increased blood sugars have been elevated for about 2 weeks. He was previously scheduled to see [facility] regarding paying for his insulin and missed that appointment and has not had his insulin for over 2 weeks.  Lab Results  Component Value Date   HGBA1C 11.9* 06/01/2015    3.) Schizoaffective - Improved since last visit which he indicates he does not recall. Maintained on Trazadone, Seroquel, Adderrall and Doxepin. Takes the medications as prescribed and denies adverse side effects. Reports that he is sleeping and eating well.   Allergies  Allergen Reactions  . Sulfa Antibiotics Other (See Comments)    Unknown - Childhood    Current Outpatient Prescriptions on File Prior to Visit  Medication Sig Dispense Refill  . FREESTYLE LITE test strip TEST BLOOD SUGARS 3 TIMES DAILY AND WHEN NEEDED  0  . insulin NPH-regular Human (NOVOLIN 70/30) (70-30) 100 UNIT/ML injection Inject 40 Units into the skin 2 (two) times daily with a meal. [facility] has [employer] brand Insulin 10 mL 0  . Insulin Syringe-Needle U-100 (SAFETY-GLIDE 0.3CC SYR 29GX1/2) 29G X 1/2" 0.3 ML MISC Use as directed. 200 each 0  . Lancets (FREESTYLE) lancets TEST 3 TIMES DAILY AND WHEN NEEDED  0  . [DISCONTINUED] citalopram (CELEXA) 20 MG tablet Take 20 mg by mouth  daily.    . [DISCONTINUED] FLUoxetine (PROZAC) 20 MG tablet Take 20 mg by mouth every morning.    . [DISCONTINUED] insulin glargine (LANTUS) 100 UNIT/ML injection Inject 30 Units into the skin at bedtime. 10 mL 3  . [DISCONTINUED] insulin lispro (HUMALOG) 100 UNIT/ML injection Inject 10 Units into the skin 3 (three) times daily before meals.      . [DISCONTINUED] OLANZapine zydis (ZYPREXA) 10 MG disintegrating tablet Take 10 mg by mouth 2 (two) times daily.    . [DISCONTINUED] sertraline (ZOLOFT) 50 MG tablet Take 50 mg by mouth daily. For depression. Just started med, have not picked up yet [facility] [address]     No current facility-administered medications on file prior to visit.    Past Medical History  Diagnosis Date  . Depression   . Schizo affective schizophrenia   . Polysubstance abuse   . Scoliosis   . Chronic pain   . Noncompliance with medication regimen   . GERD (gastroesophageal reflux disease)   . Arthritis     "both hips" (12/21/2014)  . Asthma     Childhood  . Chicken pox   . Diabetes mellitus     Type 1 diabetes  . DKA (diabetic ketoacidoses) 11/18/2014  . Hypertension     Review of Systems  Constitutional: Negative for fever, chills and diaphoresis.  Respiratory: Negative for chest tightness and shortness of breath.   Cardiovascular: Negative for chest  pain, palpitations and leg swelling.  Neurological: Positive for headaches. Negative for weakness, light-headedness and numbness.  Psychiatric/Behavioral: Negative for suicidal ideas, hallucinations, confusion, sleep disturbance, self-injury and agitation. The patient is not hyperactive.       Objective:    BP 122/62 mmHg  Pulse 116  Temp(Src) 97.5 F (36.4 C) (Oral)  Resp 18  Ht 6\' 1"  (1.854 m)  Wt 141 lb (63.957 kg)  BMI 18.61 kg/m2  SpO2 96% Nursing note and vital signs reviewed.  Physical Exam  Constitutional: He is oriented to person, place, and time. He appears well-developed and  well-nourished. No distress.  Appears older than his stated age and is disheveled. Thought process labile.   Cardiovascular: Normal rate, regular rhythm, normal heart sounds and intact distal pulses.   Pulmonary/Chest: Effort normal and breath sounds normal.  Neurological: He is alert and oriented to person, place, and time.  Skin: Skin is warm and dry.  Psychiatric: He has a normal mood and affect. His behavior is normal. Judgment and thought content normal.       Assessment & Plan:   Problem List Items Addressed This Visit      Endocrine   Diabetes type 1, uncontrolled    In office blood sugar elevated >400 secondary to patient not having medication. He forgot about his appointment with [facility]. Reports the medication is too expensive for him to afford. Discussed and emphasized importance of insulin regimen and follow up maintenance with [facility]. Follow up in 1 month.       Relevant Orders   POCT glucose (manual entry) (Completed)     Other   Schizoaffective disorder, unspecified type    Continues to be labile with current regimen. States that he takes the medications as prescribed. Discussed importance of following up with [facility] for continued care management and the importance of multidisciplinary approach. Continues to decline seeing psychiatry. Continue current dosages of trazodone, quetiapine, doxepin and adderall. Encouraged follow up with [facility].       Relevant Medications   traZODone (DESYREL) 100 MG tablet   QUEtiapine (SEROQUEL) 100 MG tablet   doxepin (SINEQUAN) 100 MG capsule   amphetamine-dextroamphetamine (ADDERALL) 20 MG tablet   Generalized headache    Symptoms consistent with generalized/tension headache. Neurological exam is benign. Start naproxen as needed for headache. Follow up if symptoms worsen or fail to improve.       Relevant Medications   traZODone (DESYREL) 100 MG tablet   doxepin (SINEQUAN) 100 MG capsule    NAPROSYN 500 MG tablet

## 2015-07-26 ENCOUNTER — Other Ambulatory Visit: Payer: Self-pay

## 2015-07-26 NOTE — Patient Outreach (Signed)
[facility]) Care Management  07/26/2015  DENALI BECVAR 1986-01-06 [id number]  Telephonic Care Management Note  Referral Date:  07/26/15 Referral Source: [facility]   Referral Issues:  DM, medication, transportation and financial issues.    (H/O Initial Referral Date: 06/09/15 but unsuccessful outreach and case was closed 07/08/15). Referral Issues:  Patient with Multiple episodes of inpatient admissions and ED visit relating to ketoacidosis relating to non-compliance.  Has Transportation and medication procurement relating to financial resources. Patient has applied for Medicaid in the past but believes he has too much income.  Patient has BH issues but not active with provider.   PCP:  Dr. Marga Melnick / Jeanine Luz NP  Triage/Screening outreach call #1.  Patient not reached.   RN CM left HIPAA compliant voice message with name and contact #.  RN CM rescheduled for next outreach call.   Donato Schultz, RN, BSN, [facility], CCM  [facility] Coordinator [phone] Direct [phone] Cell [phone] Office [phone] Fax

## 2015-07-26 NOTE — Patient Outreach (Signed)
[facility]) Care Management  07/26/2015  [name] 10-12-1986 [id number]   Referral from [facility], assigned to Donato Schultz, RN, Dickie La, LCSW, and [name], [name] for patient outreach.  [name], AAS [facility] Care Management Assistant

## 2015-07-28 ENCOUNTER — Ambulatory Visit: Payer: Self-pay

## 2015-07-30 ENCOUNTER — Other Ambulatory Visit: Payer: Self-pay | Admitting: Pharmacist

## 2015-07-30 ENCOUNTER — Ambulatory Visit: Payer: Self-pay

## 2015-07-30 NOTE — Patient Outreach (Signed)
[name] is a 29 y.o. male again referred to pharmacy for medication adherence. Patient originally contacted in response to first referral on 06/18/15 and episode closed on 07/07/15 after unsuccessful outreach to patient by three phone attempts and a letter.   Left a HIPAA compliant message on the patient's voicemail. If have not heard from patient by 08/02/15, will give her another call at that time.  Duanne Moron, [name] Clinical Pharmacist [facility] Care Management [phone]

## 2015-08-02 ENCOUNTER — Other Ambulatory Visit: Payer: Self-pay | Admitting: Licensed Clinical Social Worker

## 2015-08-02 ENCOUNTER — Ambulatory Visit: Payer: Self-pay

## 2015-08-02 ENCOUNTER — Other Ambulatory Visit: Payer: Self-pay | Admitting: Pharmacist

## 2015-08-02 NOTE — Patient Outreach (Signed)
[facility]) Care Management  08/02/2015  [name] [birth year] [id number]   Assessment-CSW received referral from [facility] for transportation and financial barriers. CSW attempted initial outreach on 08/02/15 but was unsuccessful in reaching patient. CSW left HIPPA compliant voice message requesting call back.   Plan-CSW will await call back and if this does not take place , CSW will attempt second outreach attempt.  Dickie La, BSW, MSW, LCSW [email or web address] Phone: [phone] Fax: [phone]

## 2015-08-02 NOTE — Patient Outreach (Signed)
[name] is a 29 y.o. male again referred to pharmacy for medication adherence. Patient originally contacted in response to first referral on 06/18/15 and episode closed on 07/07/15 after unsuccessful outreach to patient by three phone attempts and a letter.   Left a HIPAA compliant message on the patient's voicemail. Outreach attempt #2. If have not heard from patient by 08/04/15, will give her another call at that time.  Duanne Moron, [name] Clinical Pharmacist [facility] Care Management [phone]

## 2015-08-03 ENCOUNTER — Other Ambulatory Visit: Payer: Self-pay

## 2015-08-03 ENCOUNTER — Other Ambulatory Visit: Payer: Self-pay | Admitting: Licensed Clinical Social Worker

## 2015-08-03 NOTE — Patient Outreach (Signed)
[facility]) Care Management  08/03/2015  BRAIDYN SCORSONE Aug 31, 1986 [id number]   Assessment-CSW received referral to assist patient with transportation and finances. CSW attempted to make second outreach by phone but was unsuccessful in reaching him. CSW left another HIPPA compliant voice message urging patient to return call back to CSW.  Plan-CSW will make 3rd outreach attempt by the end of the week if CSW had not heard back from patient.  Dickie La, BSW, MSW, LCSW [email or web address] Phone: [phone] Fax: [phone]

## 2015-08-03 NOTE — Patient Outreach (Signed)
[facility]) Care Management  08/03/2015  [name] Jun 14, 1986 [id number]  Telephonic Care Management Note  Referral Date: 07/26/15 Referral Source: [facility]  Referral Issues: DM, medication, transportation and financial issues.  (H/O Initial Referral Date: 06/09/15 but unsuccessful outreach and case was closed 07/08/15). Referral Issues: Patient with Multiple episodes of inpatient admissions and ED visit relating to ketoacidosis relating to non-compliance. Has Transportation and medication procurement relating to financial resources. Patient has applied for Medicaid in the past but believes he has too much income. Patient has BH issues but not active with provider.  PCP: Dr. Marga Melnick / Jeanine Luz NP  Triage/Screening outreach call #2. Patient not reached.  RN CM left HIPAA compliant voice message with name and contact #.  RN CM rescheduled for next outreach call within one week.    Donato Schultz, RN, BSN, [facility], CCM  [facility] Coordinator [phone] Direct [phone] Cell [phone] Office [phone] Fax

## 2015-08-04 ENCOUNTER — Other Ambulatory Visit: Payer: Self-pay | Admitting: Pharmacist

## 2015-08-04 ENCOUNTER — Encounter: Payer: Self-pay | Admitting: Pharmacist

## 2015-08-04 NOTE — Patient Outreach (Signed)
[name] is a 29 y.o. male again referred to pharmacy for medication adherence. Patient originally contacted in response to first referral on 06/18/15 and episode closed on 07/07/15 after unsuccessful outreach to patient by three phone attempts and a letter.   Left a third HIPAA compliant message on the patient's voicemail today. I will send patient outreach letter to attempt contact. If I have not heard back from [name] within 10 days, will close pharmacy episode.  Duanne Moron, [name] Clinical Pharmacist [facility] Care Management [phone]

## 2015-08-05 ENCOUNTER — Ambulatory Visit: Payer: Self-pay

## 2015-08-06 ENCOUNTER — Other Ambulatory Visit: Payer: Self-pay

## 2015-08-06 NOTE — Patient Outreach (Signed)
[facility]) Care Management  08/06/2015  DJIBRIL GLOGOWSKI Nov 28, 1986 [id number]   Telephonic Care Management Note  Referral Date: 07/26/15 Referral Source: [facility]  Referral Issues: DM, medication, transportation and financial issues.  (H/O Initial Referral Date: 06/09/15 but unsuccessful outreach and case was closed 07/08/15). Referral Issues: Patient with Multiple episodes of inpatient admissions and ED visit relating to ketoacidosis relating to non-compliance. Has Transportation and medication procurement relating to financial resources. Patient has applied for Medicaid in the past but believes he has too much income. Patient has BH issues but not active with provider.  PCP: Dr. Marga Melnick / Jeanine Luz NP  Triage/Screening outreach call #3. [phone]  Patient not reached.  RN CM left HIPAA compliant voice message with name and contact #.  RN CM sent unsuccessful outreach letter.  RN CM will review within 2 weeks and close case if no response.   Donato Schultz, RN, BSN, [facility], CCM  [facility] Coordinator [phone] Direct [phone] Cell [phone] Office [phone] Fax

## 2015-08-11 ENCOUNTER — Other Ambulatory Visit: Payer: Self-pay | Admitting: Licensed Clinical Social Worker

## 2015-08-11 NOTE — Patient Outreach (Signed)
[facility]) Care Management  08/11/2015  RANON COVEN [birth year] [id number]   Assessment-CSW atempted third and final outreach but was unsuccessful in reaching patient. CSW left HIPPA compliant voice message to patient requesting for return phone call in order to assist patient with social work needs.   Plan-CSW will send outreach to letter on 08/11/15 and if CSW had not heard back from patient within 10 business days, CSW will complete case closure.  Dickie La, BSW, MSW, LCSW [email or web address] Phone: [phone] Fax: [phone]

## 2015-08-16 ENCOUNTER — Ambulatory Visit: Payer: PPO | Admitting: Family

## 2015-08-16 ENCOUNTER — Ambulatory Visit: Payer: PPO | Admitting: Family Medicine

## 2015-08-16 DIAGNOSIS — Z0289 Encounter for other administrative examinations: Secondary | ICD-10-CM

## 2015-08-18 ENCOUNTER — Other Ambulatory Visit: Payer: Self-pay | Admitting: Pharmacist

## 2015-08-18 NOTE — Patient Outreach (Signed)
[name] is a 29 y.o. male again referred to pharmacy for medication adherence. Patient originally contacted in response to first referral on 06/18/15 and episode closed on 07/07/15 after unsuccessful outreach to patient by three phone attempts and a letter.   Have again made three phone call attempts and sent letter. No response within 10 days. Will now close pharmacy episode.  Duanne Moron, [name] Clinical Pharmacist [facility] Care Management [phone]

## 2015-08-19 ENCOUNTER — Encounter: Payer: Self-pay | Admitting: Family

## 2015-08-19 ENCOUNTER — Ambulatory Visit (INDEPENDENT_AMBULATORY_CARE_PROVIDER_SITE_OTHER): Payer: PPO | Admitting: Family

## 2015-08-19 VITALS — BP 124/92 | HR 117 | Temp 97.5°F | Resp 18 | Ht 73.0 in | Wt 137.0 lb

## 2015-08-19 DIAGNOSIS — E1065 Type 1 diabetes mellitus with hyperglycemia: Secondary | ICD-10-CM

## 2015-08-19 DIAGNOSIS — M25512 Pain in left shoulder: Secondary | ICD-10-CM | POA: Diagnosis not present

## 2015-08-19 DIAGNOSIS — IMO0002 Reserved for concepts with insufficient information to code with codable children: Secondary | ICD-10-CM

## 2015-08-19 DIAGNOSIS — F259 Schizoaffective disorder, unspecified: Secondary | ICD-10-CM

## 2015-08-19 LAB — GLUCOSE, POCT (MANUAL RESULT ENTRY): POC Glucose: HIGH mg/dl (ref 70–99)

## 2015-08-19 MED ORDER — DOXEPIN HCL 100 MG PO CAPS: 100.0000 mg | ORAL_CAPSULE | Freq: Every day | ORAL | Status: DC

## 2015-08-19 MED ORDER — AMPHETAMINE-DEXTROAMPHETAMINE 20 MG PO TABS: 40.0000 mg | ORAL_TABLET | Freq: Two times a day (BID) | ORAL | Status: DC

## 2015-08-19 MED ORDER — TRAZODONE HCL 100 MG PO TABS: 200.0000 mg | ORAL_TABLET | Freq: Every day | ORAL | Status: DC

## 2015-08-19 MED ORDER — QUETIAPINE FUMARATE 100 MG PO TABS: 100.0000 mg | ORAL_TABLET | Freq: Two times a day (BID) | ORAL | Status: DC

## 2015-08-19 MED ORDER — OMEPRAZOLE 20 MG PO CPDR: 20.0000 mg | DELAYED_RELEASE_CAPSULE | Freq: Every day | ORAL | Status: DC

## 2015-08-19 NOTE — Progress Notes (Signed)
Pre visit review using our clinic review tool, if applicable. No additional management support is needed unless otherwise documented below in the visit note. 

## 2015-08-19 NOTE — Assessment & Plan Note (Signed)
Thought process is slightly pressured and paranoid with skepticism. Encouraged need for psychiatric referral and need for medication management with psychotherapy. He is adamantly set that he does not need their help and his medication is good where it is at. Continue current dosage of doxepin, quetiapine, trazodone, and Adderall. Will refer to psychiatry for assistance in management. If patient remains non-compliant with regimens may have to find additional providers.

## 2015-08-19 NOTE — Assessment & Plan Note (Signed)
In office POCT glucose is HIGH indicating an excessively elevated blood sugar. Discussed and educated patient that his diabetes remains uncontrolled and the need to take his insulin to prevent DKA and further complications. Patient's thought process is questionable as he insists diabetes is a made up disease and since he feels fine there is no need to take insulin. Expressed concern for the development of DKA and educated him regarding the symptoms, however [facility] was unsuccessful secondary to his thought process. Encouraged patient to take insulin as prescribed and if his blood sugar remains elevated to seek emergency care, although patient is reluctant. One challenge is his financial situation. Discussed financial resources including drug companies and possibly the Health Department. Patient indicates that he willing to attempt this. Insulin 75/25 pen provided to assist with care. Continue current dosage and follow up in one month or sooner.

## 2015-08-19 NOTE — Patient Instructions (Signed)
Thank you for choosing [facility].  Summary/Instructions:  Your prescription(s) have been submitted to your pharmacy or been printed and provided for you. Please take as directed and contact our office if you believe you are having problem(s) with the medication(s) or have any questions.  If your symptoms worsen or fail to improve, please contact our office for further instruction, or in case of emergency go directly to the emergency room at the closest medical facility.   Shoulder Exercises EXERCISES  RANGE OF MOTION (ROM) AND STRETCHING EXERCISES These exercises may help you when beginning to rehabilitate your injury. Your symptoms may resolve with or without further involvement from your physician, physical therapist or athletic trainer. While completing these exercises, remember:   Restoring tissue flexibility helps normal motion to return to the joints. This allows healthier, less painful movement and activity.  An effective stretch should be held for at least 30 seconds.  A stretch should never be painful. You should only feel a gentle lengthening or release in the stretched tissue. ROM - Pendulum  Bend at the waist so that your right / left arm falls away from your body. Support yourself with your opposite hand on a solid surface, such as a table or a countertop.  Your right / left arm should be perpendicular to the ground. If it is not perpendicular, you need to lean over farther. Relax the muscles in your right / left arm and shoulder as much as possible.  Gently sway your hips and trunk so they move your right / left arm without any use of your right / left shoulder muscles.  Progress your movements so that your right / left arm moves side to side, then forward and backward, and finally, both clockwise and counterclockwise.  Complete __________ repetitions in each direction. Many people use this exercise to relieve discomfort in their shoulder as well as to gain range of  motion. Repeat __________ times. Complete this exercise __________ times per day. STRETCH - Flexion, Standing  Stand with good posture. With an underhand grip on your right / left hand and an overhand grip on the opposite hand, grasp a broomstick or cane so that your hands are a little more than shoulder-width apart.  Keeping your right / left elbow straight and shoulder muscles relaxed, push the stick with your opposite hand to raise your right / left arm in front of your body and then overhead. Raise your arm until you feel a stretch in your right / left shoulder, but before you have increased shoulder pain.  Try to avoid shrugging your right / left shoulder as your arm rises by keeping your shoulder blade tucked down and toward your mid-back spine. Hold __________ seconds.  Slowly return to the starting position. Repeat __________ times. Complete this exercise __________ times per day. STRETCH - Internal Rotation  Place your right / left hand behind your back, palm-up.  Throw a towel or belt over your opposite shoulder. Grasp the towel/belt with your right / left hand.  While keeping an upright posture, gently pull up on the towel/belt until you feel a stretch in the front of your right / left shoulder.  Avoid shrugging your right / left shoulder as your arm rises by keeping your shoulder blade tucked down and toward your mid-back spine.  Hold __________. Release the stretch by lowering your opposite hand. Repeat __________ times. Complete this exercise __________ times per day. STRETCH - External Rotation and Abduction  Stagger your stance through a  doorframe. It does not matter which foot is forward.  As instructed by your physician, physical therapist or athletic trainer, place your hands:  And forearms above your head and on the door frame.  And forearms at head-height and on the door frame.  At elbow-height and on the door frame.  Keeping your head and chest upright and  your stomach muscles tight to prevent over-extending your low-back, slowly shift your weight onto your front foot until you feel a stretch across your chest and/or in the front of your shoulders.  Hold __________ seconds. Shift your weight to your back foot to release the stretch. Repeat __________ times. Complete this stretch __________ times per day.  STRENGTHENING EXERCISES  These exercises may help you when beginning to rehabilitate your injury. They may resolve your symptoms with or without further involvement from your physician, physical therapist or athletic trainer. While completing these exercises, remember:   Muscles can gain both the endurance and the strength needed for everyday activities through controlled exercises.  Complete these exercises as instructed by your physician, physical therapist or athletic trainer. Progress the resistance and repetitions only as guided.  You may experience muscle soreness or fatigue, but the pain or discomfort you are trying to eliminate should never worsen during these exercises. If this pain does worsen, stop and make certain you are following the directions exactly. If the pain is still present after adjustments, discontinue the exercise until you can discuss the trouble with your clinician.  If advised by your physician, during your recovery, avoid activity or exercises which involve actions that place your right / left hand or elbow above your head or behind your back or head. These positions stress the tissues which are trying to heal. STRENGTH - Scapular Depression and Adduction  With good posture, sit on a firm chair. Supported your arms in front of you with pillows, arm rests or a table top. Have your elbows in line with the sides of your body.  Gently draw your shoulder blades down and toward your mid-back spine. Gradually increase the tension without tensing the muscles along the top of your shoulders and the back of your neck.  Hold for  __________ seconds. Slowly release the tension and relax your muscles completely before completing the next repetition.  After you have practiced this exercise, remove the arm support and complete it in standing as well as sitting. Repeat __________ times. Complete this exercise __________ times per day.  STRENGTH - External Rotators  Secure a rubber exercise band/tubing to a fixed object so that it is at the same height as your right / left elbow when you are standing or sitting on a firm surface.  Stand or sit so that the secured exercise band/tubing is at your side that is not injured.  Bend your elbow 90 degrees. Place a folded towel or small pillow under your right / left arm so that your elbow is a few inches away from your side.  Keeping the tension on the exercise band/tubing, pull it away from your body, as if pivoting on your elbow. Be sure to keep your body steady so that the movement is only coming from your shoulder rotating.  Hold __________ seconds. Release the tension in a controlled manner as you return to the starting position. Repeat __________ times. Complete this exercise __________ times per day.  STRENGTH - Supraspinatus  Stand or sit with good posture. Grasp a __________ weight or an exercise band/tubing so that your hand is "  thumbs-up," like when you shake hands.  Slowly lift your right / left hand from your thigh into the air, traveling about 30 degrees from straight out at your side. Lift your hand to shoulder height or as far as you can without increasing any shoulder pain. Initially, many people do not lift their hands above shoulder height.  Avoid shrugging your right / left shoulder as your arm rises by keeping your shoulder blade tucked down and toward your mid-back spine.  Hold for __________ seconds. Control the descent of your hand as you slowly return to your starting position. Repeat __________ times. Complete this exercise __________ times per day.    STRENGTH - Shoulder Extensors  Secure a rubber exercise band/tubing so that it is at the height of your shoulders when you are either standing or sitting on a firm arm-less chair.  With a thumbs-up grip, grasp an end of the band/tubing in each hand. Straighten your elbows and lift your hands straight in front of you at shoulder height. Step back away from the secured end of band/tubing until it becomes tense.  Squeezing your shoulder blades together, pull your hands down to the sides of your thighs. Do not allow your hands to go behind you.  Hold for __________ seconds. Slowly ease the tension on the band/tubing as you reverse the directions and return to the starting position. Repeat __________ times. Complete this exercise __________ times per day.  STRENGTH - Scapular Retractors  Secure a rubber exercise band/tubing so that it is at the height of your shoulders when you are either standing or sitting on a firm arm-less chair.  With a palm-down grip, grasp an end of the band/tubing in each hand. Straighten your elbows and lift your hands straight in front of you at shoulder height. Step back away from the secured end of band/tubing until it becomes tense.  Squeezing your shoulder blades together, draw your elbows back as you bend them. Keep your upper arm lifted away from your body throughout the exercise.  Hold __________ seconds. Slowly ease the tension on the band/tubing as you reverse the directions and return to the starting position. Repeat __________ times. Complete this exercise __________ times per day. STRENGTH - Scapular Depressors  Find a sturdy chair without wheels, such as a from a dining room table.  Keeping your feet on the floor, lift your bottom from the seat and lock your elbows.  Keeping your elbows straight, allow gravity to pull your body weight down. Your shoulders will rise toward your ears.  Raise your body against gravity by drawing your shoulder blades down  your back, shortening the distance between your shoulders and ears. Although your feet should always maintain contact with the floor, your feet should progressively support less body weight as you get stronger.  Hold __________ seconds. In a controlled and slow manner, lower your body weight to begin the next repetition. Repeat __________ times. Complete this exercise __________ times per day.  Document Released: 10/18/2005 Document Revised: 02/26/2012 Document Reviewed: 03/18/2009 [facility] Patient Information 2015 [place], [place]. This information is not intended to replace advice given to you by your health care provider. Make sure you discuss any questions you have with your health care provider.

## 2015-08-19 NOTE — Progress Notes (Signed)
Subjective:    Patient ID: [name], male    DOB: 1986/02/27, 29 y.o.   MRN: [id number]  Chief Complaint  Patient presents with  . Follow-up    wants to have an xray of his right shoulder, and wants zofran, states he has been throwing up    HPI:  [name] is a 29 y.o. male with a PMH of arthritis, asthma, depression, diabetes, GERD, schizoaffective schizophrenia, hypertension, noncompliance and substance abuse who presents today for a follow-up office visit.[name]   1.) Diabetes - Previously given 14 day sample of 75/25 insulin to assist with maintaining his blood sugars. Indicates that he has not taken the medication as prescribed and is currently experiencing the associated symptoms of nausea and occasional vomiting which he attributes to eating to much. He has been discharged from [facility] care management secondary to inability to reach him and has not followed up with [facility]. Indicates the insulin that he is prescribed is too expensive and would like "another form of treatment." He would also like a repeat blood work to confirm that his diabetes is not a hoax. Also indicates there is no reason to be treated because he feels absolutely fine with no other symptoms.  Lab Results  Component Value Date   HGBA1C 11.9* 06/01/2015    2.) Schizophrenia - Currently maintained on doxepin, quetiapine, trazodone, and adderall. Reports that he takes the medication as prescribed and denies adverse side effects.   3.) Shoulder pain - Associated symptom of pain located in his left shoulder has been going on for several months. Describes the pain as waxing and waning and excruciating. Reports a previous provider has simply provided injections of cortisone which has significantly in the past and states that he just needs surgery and wishes to skip any other conservative treatment. Denies any additional treatments performed. Would like an x-ray of his shoulder.  Expresses continuously that provider does not understand where he is coming from and he just needs surgery.    Allergies  Allergen Reactions  . Sulfa Antibiotics Other (See Comments)    Unknown - Childhood    Current Outpatient Prescriptions on File Prior to Visit  Medication Sig Dispense Refill  . FREESTYLE LITE test strip TEST BLOOD SUGARS 3 TIMES DAILY AND WHEN NEEDED  0  . insulin NPH-regular Human (NOVOLIN 70/30) (70-30) 100 UNIT/ML injection Inject 40 Units into the skin 2 (two) times daily with a meal. [facility] has [employer] brand Insulin 10 mL 0  . Insulin Syringe-Needle U-100 (SAFETY-GLIDE 0.3CC SYR 29GX1/2) 29G X 1/2" 0.3 ML MISC Use as directed. 200 each 0  . Lancets (FREESTYLE) lancets TEST 3 TIMES DAILY AND WHEN NEEDED  0  . NAPROSYN 500 MG tablet Take 1 tablet (500 mg total) by mouth 2 (two) times daily with a meal. 60 tablet 0  . [DISCONTINUED] citalopram (CELEXA) 20 MG tablet Take 20 mg by mouth daily.    . [DISCONTINUED] FLUoxetine (PROZAC) 20 MG tablet Take 20 mg by mouth every morning.    . [DISCONTINUED] insulin glargine (LANTUS) 100 UNIT/ML injection Inject 30 Units into the skin at bedtime. 10 mL 3  . [DISCONTINUED] insulin lispro (HUMALOG) 100 UNIT/ML injection Inject 10 Units into the skin 3 (three) times daily before meals.      . [DISCONTINUED] OLANZapine zydis (ZYPREXA) 10 MG disintegrating tablet Take 10 mg by mouth 2 (two) times daily.    . [DISCONTINUED] sertraline (ZOLOFT) 50 MG tablet Take 50 mg  by mouth daily. For depression. Just started med, have not picked up yet [facility] [address]     No current facility-administered medications on file prior to visit.    Review of Systems  Constitutional: Negative for fever, chills and diaphoresis.  Respiratory: Negative for chest tightness and shortness of breath.   Cardiovascular: Negative for chest pain, palpitations and leg swelling.  Gastrointestinal: Positive for nausea. Negative for vomiting, abdominal pain  and diarrhea.  Musculoskeletal:       Positive for shoulder pain  Neurological: Negative for numbness.  Psychiatric/Behavioral: Positive for decreased concentration. Negative for suicidal ideas and hallucinations. The patient is nervous/anxious.       Objective:    BP 124/92 mmHg  Pulse 117  Temp(Src) 97.5 F (36.4 C) (Oral)  Resp 18  Ht 6\' 1"  (1.854 m)  Wt 137 lb (62.143 kg)  BMI 18.08 kg/m2  SpO2 97% Nursing note and vital signs reviewed.  Physical Exam  Constitutional: He is oriented to person, place, and time. He appears well-developed and well-nourished. No distress.  Cardiovascular: Normal rate, regular rhythm, normal heart sounds and intact distal pulses.   Pulmonary/Chest: Effort normal and breath sounds normal.  Musculoskeletal:  Left shoulder - no obvious deformity, discoloration or edema noted. Unable to elicit tenderness although midscapular body is indicated by patient. ROM is normal. Distal pulses and sensation are intact and appropriate. Apprehension and impingement are negative.   Neurological: He is alert and oriented to person, place, and time.  Skin: Skin is warm and dry.  Psychiatric: He has a normal mood and affect. His speech is normal. He is hyperactive. Thought content is paranoid. He expresses impulsivity.       Assessment & Plan:   Problem List Items Addressed This Visit      Endocrine   Diabetes type 1, uncontrolled    In office POCT glucose is HIGH indicating an excessively elevated blood sugar. Discussed and educated patient that his diabetes remains uncontrolled and the need to take his insulin to prevent DKA and further complications. Patient's thought process is questionable as he insists diabetes is a made up disease and since he feels fine there is no need to take insulin. Expressed concern for the development of DKA and educated him regarding the symptoms, however [employer] was unsuccessful secondary to his thought process. Encouraged patient  to take insulin as prescribed and if his blood sugar remains elevated to seek emergency care, although patient is reluctant. One challenge is his financial situation. Discussed financial resources including drug companies and possibly the Health Department. Patient indicates that he willing to attempt this. Insulin 75/25 pen provided to assist with care. Continue current dosage and follow up in one month or sooner.       Relevant Medications   omeprazole (PRILOSEC) 20 MG capsule     Other   Schizoaffective disorder, unspecified type    Thought process is slightly pressured and paranoid with skepticism. Encouraged need for psychiatric referral and need for medication management with psychotherapy. He is adamantly set that he does not need their help and his medication is good where it is at. Continue current dosage of doxepin, quetiapine, trazodone, and Adderall. Will refer to psychiatry for assistance in management. If patient remains non-compliant with regimens may have to find additional providers.        Relevant Medications   amphetamine-dextroamphetamine (ADDERALL) 20 MG tablet   QUEtiapine (SEROQUEL) 100 MG tablet   traZODone (DESYREL) 100 MG tablet   doxepin (  SINEQUAN) 100 MG capsule   Left shoulder pain    Left shoulder pain of questionable origin that waxes and wanes. Continue conservative treatment with home therapy and refer to orthopedics for further evaluation.       Relevant Orders   AMB referral to orthopedics    Other Visit Diagnoses    Type I diabetes mellitus, uncontrolled    -  Primary    Relevant Orders    POCT glucose (manual entry) (Completed)

## 2015-08-19 NOTE — Assessment & Plan Note (Signed)
Left shoulder pain of questionable origin that waxes and wanes. Continue conservative treatment with home therapy and refer to orthopedics for further evaluation.

## 2015-08-24 ENCOUNTER — Other Ambulatory Visit: Payer: Self-pay | Admitting: Licensed Clinical Social Worker

## 2015-08-24 NOTE — Patient Outreach (Signed)
[facility]) Care Management  08/24/2015  WARDEN BUFFA 1986-06-30 [id number]   Notification received from Dickie La, LCSW to close case due to not being able to make contact with the patient.  Leontine Radman L. Maleeha Halls, AAS [facility] Care Management Assistant

## 2015-08-24 NOTE — Patient Outreach (Signed)
[facility]) Care Management  08/24/2015  [name] July 28, 1986 [id number]  Assessment-CSW attempted fourth and final outreach attempt but was unsuccessful in reaching patient. CSW left HIPPA compliant voice message informing patient of SW case closure. CSW sent out barrier letter on 08/11/15 and has waited 10 business days.  Plan-CSW will inform RNCM Crystal Hutchinson, Pharmacist Duanne Moron (who are still active with patient) and CMA Nena Polio of SW case closure and discharge accordingly.   [name], BSW, MSW, LCSW [name] Phone: [phone] Fax: [phone]

## 2015-08-24 NOTE — Patient Outreach (Signed)
[facility]) Care Management  08/24/2015  [name] 1986-01-11 [id number]   Telephonic Care Management Note:  No response received back from patient following unsuccessful outreach letter and 3 attempted calls.  Case closed.   Plan:   RN CM notified [facility] Administrative Assistant - Case closed - unable to reach RN CM notified Primary MD via case closure letter.   Donato Schultz, RN, BSN, [facility], CCM  [facility] Management Coordinator [phone] Direct [phone] Cell [phone] Office [phone] Fax

## 2015-09-21 ENCOUNTER — Telehealth: Payer: Self-pay | Admitting: Family

## 2015-09-21 DIAGNOSIS — F259 Schizoaffective disorder, unspecified: Secondary | ICD-10-CM

## 2015-09-21 NOTE — Telephone Encounter (Signed)
Patient is requesting med refills for adderral and trazadone. No access for follow up visit. Could script be issued with no follow up this month?

## 2015-09-22 MED ORDER — TRAZODONE HCL 100 MG PO TABS: 200.0000 mg | ORAL_TABLET | Freq: Every day | ORAL | Status: DC

## 2015-09-22 NOTE — Addendum Note (Signed)
Addended by: Jeanine Luz D on: 09/22/2015 05:01 PM   Modules accepted: Orders

## 2015-09-22 NOTE — Telephone Encounter (Signed)
I will send in the trazadone, but not the adderall without blood sugar management and an office visit.

## 2015-09-24 ENCOUNTER — Ambulatory Visit: Payer: PPO | Admitting: Family Medicine

## 2015-09-24 ENCOUNTER — Other Ambulatory Visit: Payer: Self-pay | Admitting: Internal Medicine

## 2015-09-24 DIAGNOSIS — Z0289 Encounter for other administrative examinations: Secondary | ICD-10-CM

## 2015-09-24 DIAGNOSIS — F259 Schizoaffective disorder, unspecified: Secondary | ICD-10-CM

## 2015-09-24 MED ORDER — AMPHETAMINE-DEXTROAMPHETAMINE 20 MG PO TABS: 40.0000 mg | ORAL_TABLET | Freq: Two times a day (BID) | ORAL | Status: DC

## 2015-10-26 ENCOUNTER — Telehealth: Payer: Self-pay | Admitting: Family

## 2015-10-26 NOTE — Telephone Encounter (Signed)
Pt mother called in and said that pt

## 2015-10-27 ENCOUNTER — Telehealth: Payer: Self-pay | Admitting: Family

## 2015-10-27 ENCOUNTER — Other Ambulatory Visit: Payer: Self-pay | Admitting: Family

## 2015-10-27 DIAGNOSIS — F259 Schizoaffective disorder, unspecified: Secondary | ICD-10-CM

## 2015-10-27 MED ORDER — AMPHETAMINE-DEXTROAMPHETAMINE 20 MG PO TABS: 40.0000 mg | ORAL_TABLET | Freq: Two times a day (BID) | ORAL | Status: DC

## 2015-10-27 MED ORDER — TRAZODONE HCL 100 MG PO TABS
200.0000 mg | ORAL_TABLET | Freq: Every day | ORAL | Status: DC
Start: 2015-10-27 — End: 2015-12-24

## 2015-10-27 NOTE — Telephone Encounter (Signed)
Patient came into office today around 12:45.  Markie asked me to assist her as she was the only registrar up front.  He stated his mother told him to come pick up his scripts for tramadol and adderall.  Patient was talking to me about my computer and stated something in regards to his computer being a mind reader?  I found out from [place] that he would send the tramadol and patient would have to come in for an OV to get the adderall. As I came back up front I noticed that the patient was sitting in a chair talking to himself.  I told the patient that he would need an OV and that I could get him in with [place] as early as the next Morning at 9:45.  Patient was upset by this he stated that he either needed to speak to [facility] or to the manager because he was told that all he needed to do was call two days in advance to get his scripts refilled.  Patient also stated that he did not have bus fair.  He did continue to get loud with me.  I had to go back to [place] to address the issue.

## 2015-10-27 NOTE — Telephone Encounter (Signed)
11.9.16 Pt came in around 12:45 stating he had prescriptions to pick up, when asked for ID he stated that "he never needed to show ID, please go get someone who won't ask me for ID, okay thanks bye" when waiting in lobby pt was either talking to himself or someone on bluetooth device. Pt was stating that if he didn't get his RX he would "bring on a tornado of paperwork" and stated he would set something on fire. Also said "Tammy Sours better give me my RX or else I'll go to administration I don't care." Pt also said "it better get taken care of" and "this is a waste of time." MS

## 2015-10-27 NOTE — Telephone Encounter (Signed)
Pt is requesting refill on Adderall and Trazodone, His phone is not working so mother request that we call her when it is ready.

## 2015-10-27 NOTE — Telephone Encounter (Signed)
Rx has been printed and given to pt.

## 2015-10-27 NOTE — Telephone Encounter (Signed)
Patient is waiting in lobby since his phone is not working.

## 2015-10-27 NOTE — Telephone Encounter (Signed)
Pt was in lobby, when I returned from lunch, a little after 1:00 pm.  He was either speaking to someone on his blue-tooth or talking to himself and cursing about inadequacies of the office and needing his prescription.  Pt was making other patients in the lobby uncomfortable with his mannerisms/ curse language.

## 2015-10-28 NOTE — Telephone Encounter (Signed)
Patient came into the office requesting refills of his Trazadone and Adderall and has not been seen in the office since 9/1 with the instructions to follow up in 1 month which he did not complete. This provider was out of the office during his last refill of medications. Patient was informed that I would fill his Trazadone and he would need an office visit for the Adderall. Per the front office staff he became upset with this and displayed poor judgement in his attitude towards staff. He was brought into an exam room and informed that his behavior was inappropriate. He has clearly not been on his medications as his thoughts are inappropriate and he is having auditory hallucinations talking with himself. I informed patient that I believe it was imperative for him to be seen in the ED or by psychiatry to help his current situation. He insisted that he was fine and he has everything under control and he knows himself and he is good. He clothing was unkempt and appears as though he has not slept in days. Patient has been non-compliant with his diabetes and psychiatric medications for what he said is cost, however he was discharged from care management for the inability to get in touch with him. At the completion of this discussion I informed him that the level of care that he needs is beyond the care that I can provide and I feel uncomfortable treating him going further. Therefore he was informed of the need to find a new provider. Based on his continued non-compliance, refusal to see psychiatry and threats the determination was made to discharge him from the practice. Appropriate paper work was completed and forwarded to the appropriate individuals.

## 2015-10-29 ENCOUNTER — Telehealth: Payer: Self-pay | Admitting: Family

## 2015-10-29 NOTE — Telephone Encounter (Signed)
Patient dismissed from [facility] by Marcos Eke FNP , effective October 27, 2015. Dismissal letter sent out by certified / registered mail.  [facility]  Certified dismissal letter returned as undeliverable, unclaimed, return to sender after three attempts by [employer]PS. Letter placed in another envelope and resent as 1st class mail which does not require a signature. 11/25/15 DAJ

## 2015-11-02 ENCOUNTER — Encounter: Payer: Self-pay | Admitting: Family

## 2015-11-04 ENCOUNTER — Emergency Department (HOSPITAL_COMMUNITY): Payer: PPO

## 2015-11-04 ENCOUNTER — Encounter (HOSPITAL_COMMUNITY): Payer: Self-pay | Admitting: Emergency Medicine

## 2015-11-04 ENCOUNTER — Inpatient Hospital Stay (HOSPITAL_COMMUNITY)
Admission: EM | Admit: 2015-11-04 | Discharge: 2015-11-08 | DRG: 641 | Disposition: A | Discharge status: Home / Self Care | Payer: PPO | Attending: Internal Medicine | Admitting: Internal Medicine

## 2015-11-04 DIAGNOSIS — M13852 Other specified arthritis, left hip: Secondary | ICD-10-CM | POA: Diagnosis present

## 2015-11-04 DIAGNOSIS — E860 Dehydration: Secondary | ICD-10-CM | POA: Diagnosis present

## 2015-11-04 DIAGNOSIS — Z833 Family history of diabetes mellitus: Secondary | ICD-10-CM | POA: Diagnosis not present

## 2015-11-04 DIAGNOSIS — Z91128 Patient's intentional underdosing of medication regimen for other reason: Secondary | ICD-10-CM

## 2015-11-04 DIAGNOSIS — T383X6A Underdosing of insulin and oral hypoglycemic [antidiabetic] drugs, initial encounter: Secondary | ICD-10-CM | POA: Diagnosis present

## 2015-11-04 DIAGNOSIS — E1100 Type 2 diabetes mellitus with hyperosmolarity without nonketotic hyperglycemic-hyperosmolar coma (NKHHC): Secondary | ICD-10-CM

## 2015-11-04 DIAGNOSIS — Z882 Allergy status to sulfonamides status: Secondary | ICD-10-CM | POA: Diagnosis not present

## 2015-11-04 DIAGNOSIS — M25512 Pain in left shoulder: Secondary | ICD-10-CM | POA: Diagnosis present

## 2015-11-04 DIAGNOSIS — E1065 Type 1 diabetes mellitus with hyperglycemia: Secondary | ICD-10-CM | POA: Diagnosis present

## 2015-11-04 DIAGNOSIS — M13851 Other specified arthritis, right hip: Secondary | ICD-10-CM | POA: Diagnosis present

## 2015-11-04 DIAGNOSIS — F22 Delusional disorders: Secondary | ICD-10-CM | POA: Diagnosis present

## 2015-11-04 DIAGNOSIS — E1010 Type 1 diabetes mellitus with ketoacidosis without coma: Secondary | ICD-10-CM

## 2015-11-04 DIAGNOSIS — R739 Hyperglycemia, unspecified: Secondary | ICD-10-CM

## 2015-11-04 DIAGNOSIS — Z9889 Other specified postprocedural states: Secondary | ICD-10-CM | POA: Diagnosis not present

## 2015-11-04 DIAGNOSIS — K219 Gastro-esophageal reflux disease without esophagitis: Secondary | ICD-10-CM | POA: Diagnosis present

## 2015-11-04 DIAGNOSIS — Z79899 Other long term (current) drug therapy: Secondary | ICD-10-CM | POA: Diagnosis not present

## 2015-11-04 DIAGNOSIS — Z9114 Patient's other noncompliance with medication regimen: Secondary | ICD-10-CM | POA: Diagnosis not present

## 2015-11-04 DIAGNOSIS — Z9119 Patient's noncompliance with other medical treatment and regimen: Secondary | ICD-10-CM | POA: Diagnosis not present

## 2015-11-04 DIAGNOSIS — E870 Hyperosmolality and hypernatremia: Principal | ICD-10-CM | POA: Diagnosis present

## 2015-11-04 DIAGNOSIS — F259 Schizoaffective disorder, unspecified: Secondary | ICD-10-CM | POA: Diagnosis present

## 2015-11-04 DIAGNOSIS — R7309 Other abnormal glucose: Secondary | ICD-10-CM

## 2015-11-04 DIAGNOSIS — Z794 Long term (current) use of insulin: Secondary | ICD-10-CM | POA: Diagnosis not present

## 2015-11-04 DIAGNOSIS — I10 Essential (primary) hypertension: Secondary | ICD-10-CM | POA: Diagnosis present

## 2015-11-04 DIAGNOSIS — F17210 Nicotine dependence, cigarettes, uncomplicated: Secondary | ICD-10-CM | POA: Diagnosis present

## 2015-11-04 DIAGNOSIS — G8929 Other chronic pain: Secondary | ICD-10-CM | POA: Diagnosis present

## 2015-11-04 DIAGNOSIS — E871 Hypo-osmolality and hyponatremia: Secondary | ICD-10-CM | POA: Diagnosis not present

## 2015-11-04 LAB — URINE MICROSCOPIC-ADD ON
Bacteria, UA: NONE SEEN
WBC UA: NONE SEEN WBC/hpf (ref 0–5)

## 2015-11-04 LAB — URINALYSIS, ROUTINE W REFLEX MICROSCOPIC
BILIRUBIN URINE: NEGATIVE
HGB URINE DIPSTICK: NEGATIVE
Ketones, ur: NEGATIVE mg/dL
Leukocytes, UA: NEGATIVE
Nitrite: NEGATIVE
PROTEIN: NEGATIVE mg/dL
SPECIFIC GRAVITY, URINE: 1.03 (ref 1.005–1.030)
pH: 6.5 (ref 5.0–8.0)

## 2015-11-04 LAB — BLOOD GAS, VENOUS
Acid-Base Excess: 1.6 mmol/L (ref 0.0–2.0)
BICARBONATE: 27.3 meq/L — AB (ref 20.0–24.0)
O2 Saturation: 67.1 %
PO2 VEN: 35.2 mmHg (ref 30.0–45.0)
Patient temperature: 98.6
TCO2: 24.4 mmol/L (ref 0–100)
pCO2, Ven: 49.4 mmHg (ref 45.0–50.0)
pH, Ven: 7.362 — ABNORMAL HIGH (ref 7.250–7.300)

## 2015-11-04 LAB — CBG MONITORING, ED

## 2015-11-04 LAB — BASIC METABOLIC PANEL
Anion gap: 10 (ref 5–15)
BUN: 16 mg/dL (ref 6–20)
CALCIUM: 9.5 mg/dL (ref 8.9–10.3)
CO2: 29 mmol/L (ref 22–32)
CREATININE: 0.78 mg/dL (ref 0.61–1.24)
Chloride: 87 mmol/L — ABNORMAL LOW (ref 101–111)
GFR calc Af Amer: >60 mL/min (ref >60)
GLUCOSE: 860 mg/dL — AB (ref 65–99)
Potassium: 5 mmol/L (ref 3.5–5.1)
SODIUM: 126 mmol/L — AB (ref 135–145)

## 2015-11-04 LAB — CBC
HCT: 43.4 % (ref 39.0–52.0)
Hemoglobin: 15.3 g/dL (ref 13.0–17.0)
MCH: 31 pg (ref 26.0–34.0)
MCHC: 35.3 g/dL (ref 30.0–36.0)
MCV: 87.9 fL (ref 78.0–100.0)
PLATELETS: 330 10*3/uL (ref 150–400)
RBC: 4.94 MIL/uL (ref 4.22–5.81)
RDW: 12.3 % (ref 11.5–15.5)
WBC: 7.5 10*3/uL (ref 4.0–10.5)

## 2015-11-04 LAB — I-STAT CG4 LACTIC ACID, ED: Lactic Acid, Venous: 2.12 mmol/L (ref 0.5–2.0)

## 2015-11-04 MED ORDER — SODIUM CHLORIDE 0.9 % IV SOLN
1000.0000 mL | INTRAVENOUS | Status: DC
  Administered 2015-11-04: 1000 mL via INTRAVENOUS

## 2015-11-04 MED ORDER — SODIUM CHLORIDE 0.9 % IV SOLN
1000.0000 mL | Freq: Once | INTRAVENOUS | Status: AC
  Administered 2015-11-04: 1000 mL via INTRAVENOUS

## 2015-11-04 MED ORDER — SODIUM CHLORIDE 0.9 % IV SOLN
INTRAVENOUS | Status: DC
  Administered 2015-11-04: via INTRAVENOUS
  Filled 2015-11-04: qty 2.5

## 2015-11-04 MED ORDER — DEXTROSE-NACL 5-0.45 % IV SOLN: INTRAVENOUS | Status: DC

## 2015-11-04 NOTE — ED Notes (Signed)
Patient presents for hyperglycemia. Per EMS cbg reads "high", patient reports AH, history of schizophrenia. Patient also c/o left shoulder pain.  Last VS: 138/92, 99%ra,18resp  18g Right forearm, 200cc NS

## 2015-11-04 NOTE — ED Notes (Signed)
Notified EDP,Plunkett,MD., pt. i-stat Lactic acid results 2.12.

## 2015-11-04 NOTE — Progress Notes (Addendum)
Patient listed as having [employer] insurance and Dr. Carver Fila as his pcp.  EDP has asked EDCM to speak to patient regarding medication compliance and pcp issues.  EDCM spoke to  Patient at bedside.  Patient confirms his pcp is Dr. Carver Fila, however, he is not sure if he is still a patient of Dr. Carver Fila as he has had a "falling out" with his doctor.  When [facility] asked patient what caused the falling out?  Patient responded, "Because he won't prescribe me Adderral."  Patient reports his pcp would like him to go to a psychiatrist to receive prescription for Adderral.  Patient reports, "I don't believe in mental health people at all.  I will not see anyone associated with mental health.  Any doctor can prescribe that medication."  EDCM explained to patient that if a doctor is not comfortable prescribing a certain medication and feels that it would be safer for his patient to go to a specialist for that medication, he will refer his patient to that specialist.  Patient responded, "Then I will move to a different region.  I will also go to the medical board and cause all types of trouble."  [facility] informed patient that he has every right to do that, but if his doctor feels his decision not to prescribe a medication is safer for his patient, there's not much more that can be done.  "Then I will find another doctor."  [facility] encouraged patient to call the phone number listed on the back of his insurance card to help him find a doctor who is close to him and within network.  Patient verbalized understanding.  [facility] asked permission to schedule patient an appointment at [facility] for patient.  Patient declined and said, "They don't prescribe anything there either."  [facility] asked patient how long has it been since he took his insulin?  Patient responded, "Like 2 months."  Patient reports he cannot afford it even with his insurance, "It's like 110 dollars a month.  Patient reports he gets a 1200 dollar check a month.  [facility] asked  patient if there was anything he could cut back on so that he could pay for his insulin?  Patient responded, "No, I need cigarettes, cable food and I don't even cook my food because there's so much mess in it."  [facility] asked patient if he understood the seriousness of diabetes and that he can die if he does not treat his diabetes.  Patient resonded, "You are not a professional and I'm sorry that they have to make you say those things, so unless you are an expert you don't have to say it."  [facility] asked patient if he has a glucometer at home?  Patient reports, "I lost it."  [facility] informed patient that he may purchase one at [facility] for discount price.  Patient then stated, "Well, you can donate money to my pay pal account so that I can buy one, I don't have the money."  [facility] reminded patient that he receives a 1200 dollar check every month.  Patient proceeded to give [facility] his pay pal account number to donate funds into his account.  Patient agreeable for [facility] to place [facility] referral for disease management. Patient provided home phone number of [phone].  Discussed patient with EDP. Awaiting disposition.

## 2015-11-04 NOTE — ED Notes (Signed)
Bed: WA08 Expected date:  Expected time:  Means of arrival:  Comments: 29 yo M  Hyperglycemia, behavioral issues

## 2015-11-04 NOTE — ED Provider Notes (Signed)
CSN: [id number]     Arrival date & time 11/04/15  2050 History   First MD Initiated Contact with Patient 11/04/15 2106     Chief Complaint  Patient presents with  . Hyperglycemia     (Consider location/radiation/quality/duration/timing/severity/associated sxs/prior Treatment) HPI Comments: Patient is a 29 year old male with a history of schizophrenia, polysubstance abuse, diabetes and hypertension who presents today the EMS for elevated blood sugar. Patient states he ran out of his insulin 2 months ago and today he knew he needed his insulin. Patient denies abdominal pain, nausea, vomiting, fever, diarrhea or constipation. He does have polyuria and polydipsia. Patient eats very little and lives alone. He denies suicidal or homicidal ideation. He does not feel like he needs to talk to behavioral health at this time. He states he and his doctor had a falling out and he has not seen a doctor in a while which is why he ran out of his insulin. He smokes daily but denies recent alcohol or drug use.    Patient is a 29 y.o. male presenting with hyperglycemia. The history is provided by the patient.  Hyperglycemia Blood sugar level PTA:  >600 Severity:  Severe Timing:  Constant Progression:  Worsening Context: noncompliance   Relieved by:  None tried Ineffective treatments:  None tried Associated symptoms: no abdominal pain, no altered mental status, no blurred vision, no chest pain, no nausea, no shortness of breath, no vomiting and no weakness   Risk factors: hx of DKA   Risk factors: no recent steroid use     Past Medical History  Diagnosis Date  . Depression   . Schizo affective schizophrenia ([name])   . Polysubstance abuse   . Scoliosis   . Chronic pain   . Noncompliance with medication regimen   . GERD (gastroesophageal reflux disease)   . Arthritis     "both hips" (12/21/2014)  . Asthma     Childhood  . Chicken pox   . Diabetes mellitus     Type 1 diabetes  . DKA (diabetic  ketoacidoses) ([name]) 11/18/2014  . Hypertension    Past Surgical History  Procedure Laterality Date  . Hand surgery Left     Left thumb surgery   Family History  Problem Relation Age of Onset  . Diabetes Mother    Social History  Substance Use Topics  . Smoking status: Current Some Day Smoker -- 1.00 packs/day for 7 years    Types: Cigarettes  . Smokeless tobacco: Never Used  . Alcohol Use: No     Comment: 12/21/2014 "I drink on a rare occasion"    Review of Systems  Eyes: Negative for blurred vision.  Respiratory: Negative for shortness of breath.   Cardiovascular: Negative for chest pain.  Gastrointestinal: Negative for nausea, vomiting and abdominal pain.  Musculoskeletal:       Patient has had left shoulder pain for the last 6 months. He thinks it is from playing video games for 2-3 days at a time  Neurological: Negative for weakness.  All other systems reviewed and are negative.     Allergies  Sulfa antibiotics  Home Medications   Prior to Admission medications   Medication Sig Start Date End Date Taking? Authorizing Provider  amphetamine-dextroamphetamine (ADDERALL) 20 MG tablet Take 2 tablets (40 mg total) by mouth 2 (two) times daily. 10/27/15  Yes Veryl Speak, FNP  doxepin (SINEQUAN) 100 MG capsule Take 1 capsule (100 mg total) by mouth at bedtime. 08/19/15  Yes Tama Headings  Calone, FNP  QUEtiapine (SEROQUEL) 100 MG tablet Take 1 tablet (100 mg total) by mouth 2 (two) times daily. 08/19/15  Yes Veryl Speak, FNP  traZODone (DESYREL) 100 MG tablet Take 2 tablets (200 mg total) by mouth at bedtime. 10/27/15  Yes Veryl Speak, FNP  FREESTYLE LITE test strip TEST BLOOD SUGARS 3 TIMES DAILY AND WHEN NEEDED 03/29/15   Historical Provider, MD  insulin NPH-regular Human (NOVOLIN 70/30) (70-30) 100 UNIT/ML injection Inject 40 Units into the skin 2 (two) times daily with a meal. [facility] has [employer] brand Insulin Patient not taking: Reported on 11/04/2015 06/03/15    Elease Etienne, MD  Insulin Syringe-Needle U-100 (SAFETY-GLIDE 0.3CC SYR 29GX1/2) 29G X 1/2" 0.3 ML MISC Use as directed. 06/03/15   Elease Etienne, MD  Lancets (FREESTYLE) lancets TEST 3 TIMES DAILY AND WHEN NEEDED 03/29/15   Historical Provider, MD  NAPROSYN 500 MG tablet Take 1 tablet (500 mg total) by mouth 2 (two) times daily with a meal. Patient not taking: Reported on 11/04/2015 07/22/15   Veryl Speak, FNP  omeprazole (PRILOSEC) 20 MG capsule Take 1 capsule (20 mg total) by mouth daily. Patient not taking: Reported on 11/04/2015 08/19/15   Veryl Speak, FNP   BP 132/93 mmHg  Pulse 111  Temp(Src) 98.3 F (36.8 C) (Oral)  Resp 22  SpO2 97% Physical Exam  Constitutional: He is oriented to person, place, and time. He appears well-developed and well-nourished. No distress.  HENT:  Head: Normocephalic and atraumatic.  Mouth/Throat: Oropharynx is clear and moist.  Poor dentition and dry mucous membranes  Eyes: Conjunctivae and EOM are normal. Pupils are equal, round, and reactive to light.  Neck: Normal range of motion. Neck supple.  Cardiovascular: Regular rhythm and intact distal pulses.  Tachycardia present.   No murmur heard. Pulmonary/Chest: Effort normal and breath sounds normal. No respiratory distress. He has no wheezes. He has no rales.  Abdominal: Soft. He exhibits no distension. There is no tenderness. There is no rebound and no guarding.  Musculoskeletal: Normal range of motion. He exhibits no edema or tenderness.  Neurological: He is alert and oriented to person, place, and time.  Skin: Skin is warm and dry. No rash noted. No erythema.  Psychiatric: He has a normal mood and affect. His speech is tangential. He is actively hallucinating. Thought content is paranoid and delusional. He expresses inappropriate judgment. He expresses no homicidal and no suicidal ideation.  Patient talking about devices in the wall that can talk to him, saw that the problems of the world  and various other things  Nursing note and vitals reviewed.   ED Course  Procedures (including critical care time) Labs Review Labs Reviewed  BASIC METABOLIC PANEL - Abnormal; Notable for the following:    Sodium 126 (*)    Chloride 87 (*)    Glucose, Bld 860 (*)    All other components within normal limits  BLOOD GAS, VENOUS - Abnormal; Notable for the following:    pH, Ven 7.362 (*)    Bicarbonate 27.3 (*)    All other components within normal limits  CBG MONITORING, ED - Abnormal; Notable for the following:    Glucose-Capillary >600 (*)    All other components within normal limits  I-STAT CG4 LACTIC ACID, ED - Abnormal; Notable for the following:    Lactic Acid, Venous 2.12 (*)    All other components within normal limits  CBC  URINALYSIS, ROUTINE W REFLEX MICROSCOPIC (NOT AT  [facility])    Imaging Review No results found. I have personally reviewed and evaluated these images and lab results as part of my medical decision-making.   EKG Interpretation None      MDM   Final diagnoses:  None    Patient is a 29 year old male with a history of schizophrenia and diabetes who presents today after running out of insulin several months ago and recurrent elevated blood sugar. He denies abdominal pain or vomiting. Patient has an obvious history of schizophrenia with auditory hallucinations, paranoia and delusions however he is calm and cooperative. He denies any suicidal or homicidal ideation. When asked if he would like to speak with behavioral health he declined.  Patient's blood sugar today is greater than 600. Concern for possible developing DKA. Patient is tachycardic but otherwise hemodynamically stable. CBC, BMP, UA, VBG and lactic acid pending. Patient started on IV fluids.  10:23 PM Patient's blood sugar is over 800 today without signs of DKA. Lactate elevated at 2.12. Patient started on an insulin drip. Currently potassium and creatinine are within normal limits.  We'll  admit patient for further care. Care management also saw the patient and is going to try to get him involved with [facility] for education and help with his insulin as he does not grasp the seriousness of diabetes and the need for taking insulin.  CRITICAL CARE Performed by: Gwyneth Sprout Total critical care time: 30 minutes Critical care time was exclusive of separately billable procedures and treating other patients. Critical care was necessary to treat or prevent imminent or life-threatening deterioration. Critical care was time spent personally by me on the following activities: development of treatment plan with patient and/or surrogate as well as nursing, discussions with consultants, evaluation of patient's response to treatment, examination of patient, obtaining history from patient or surrogate, ordering and performing treatments and interventions, ordering and review of laboratory studies, ordering and review of radiographic studies, pulse oximetry and re-evaluation of patient's condition.   Gwyneth Sprout, MD 11/04/15 2224

## 2015-11-05 ENCOUNTER — Encounter (HOSPITAL_COMMUNITY): Payer: Self-pay | Admitting: Internal Medicine

## 2015-11-05 ENCOUNTER — Inpatient Hospital Stay (HOSPITAL_COMMUNITY): Payer: PPO

## 2015-11-05 DIAGNOSIS — E1165 Type 2 diabetes mellitus with hyperglycemia: Secondary | ICD-10-CM | POA: Insufficient documentation

## 2015-11-05 DIAGNOSIS — E871 Hypo-osmolality and hyponatremia: Secondary | ICD-10-CM

## 2015-11-05 DIAGNOSIS — E1100 Type 2 diabetes mellitus with hyperosmolarity without nonketotic hyperglycemic-hyperosmolar coma (NKHHC): Secondary | ICD-10-CM

## 2015-11-05 DIAGNOSIS — IMO0002 Reserved for concepts with insufficient information to code with codable children: Secondary | ICD-10-CM | POA: Insufficient documentation

## 2015-11-05 LAB — BASIC METABOLIC PANEL
ANION GAP: 7 (ref 5–15)
Anion gap: 6 (ref 5–15)
Anion gap: 6 (ref 5–15)
Anion gap: 6 (ref 5–15)
BUN: 17 mg/dL (ref 6–20)
BUN: 19 mg/dL (ref 6–20)
BUN: 18 mg/dL (ref 6–20)
BUN: 18 mg/dL (ref 6–20)
CALCIUM: 9.4 mg/dL (ref 8.9–10.3)
CHLORIDE: 102 mmol/L (ref 101–111)
CHLORIDE: 102 mmol/L (ref 101–111)
CHLORIDE: 100 mmol/L — AB (ref 101–111)
CO2: 30 mmol/L (ref 22–32)
CO2: 30 mmol/L (ref 22–32)
CO2: 29 mmol/L (ref 22–32)
CO2: 31 mmol/L (ref 22–32)
CREATININE: 0.53 mg/dL — AB (ref 0.61–1.24)
Calcium: 9.1 mg/dL (ref 8.9–10.3)
Calcium: 8.9 mg/dL (ref 8.9–10.3)
Calcium: 8.7 mg/dL — ABNORMAL LOW (ref 8.9–10.3)
Chloride: 98 mmol/L — ABNORMAL LOW (ref 101–111)
Creatinine, Ser: 0.44 mg/dL — ABNORMAL LOW (ref 0.61–1.24)
Creatinine, Ser: 0.54 mg/dL — ABNORMAL LOW (ref 0.61–1.24)
Creatinine, Ser: 0.77 mg/dL (ref 0.61–1.24)
GFR calc Af Amer: >60 mL/min (ref >60)
GFR calc Af Amer: >60 mL/min (ref >60)
GFR calc Af Amer: >60 mL/min (ref >60)
GFR calc non Af Amer: >60 mL/min (ref >60)
GLUCOSE: 294 mg/dL — AB (ref 65–99)
GLUCOSE: 216 mg/dL — AB (ref 65–99)
GLUCOSE: 213 mg/dL — AB (ref 65–99)
Glucose, Bld: 97 mg/dL (ref 65–99)
POTASSIUM: 3.8 mmol/L (ref 3.5–5.1)
POTASSIUM: 3.2 mmol/L — AB (ref 3.5–5.1)
POTASSIUM: 3.3 mmol/L — AB (ref 3.5–5.1)
POTASSIUM: 3.6 mmol/L (ref 3.5–5.1)
SODIUM: 134 mmol/L — AB (ref 135–145)
SODIUM: 138 mmol/L (ref 135–145)
SODIUM: 137 mmol/L (ref 135–145)
Sodium: 138 mmol/L (ref 135–145)

## 2015-11-05 LAB — CBC WITH DIFFERENTIAL/PLATELET
Basophils Absolute: 0 10*3/uL (ref 0.0–0.1)
Basophils Relative: 1 %
EOS PCT: 2 %
Eosinophils Absolute: 0.1 10*3/uL (ref 0.0–0.7)
HCT: 35.7 % — ABNORMAL LOW (ref 39.0–52.0)
Hemoglobin: 12.6 g/dL — ABNORMAL LOW (ref 13.0–17.0)
LYMPHS ABS: 2.5 10*3/uL (ref 0.7–4.0)
LYMPHS PCT: 33 %
MCH: 30.4 pg (ref 26.0–34.0)
MCHC: 35.3 g/dL (ref 30.0–36.0)
MCV: 86.2 fL (ref 78.0–100.0)
MONO ABS: 0.2 10*3/uL (ref 0.1–1.0)
Monocytes Relative: 3 %
Neutro Abs: 4.7 10*3/uL (ref 1.7–7.7)
Neutrophils Relative %: 61 %
PLATELETS: 276 10*3/uL (ref 150–400)
RBC: 4.14 MIL/uL — ABNORMAL LOW (ref 4.22–5.81)
RDW: 12.4 % (ref 11.5–15.5)
WBC: 7.6 10*3/uL (ref 4.0–10.5)

## 2015-11-05 LAB — CBC
HCT: 39.6 % (ref 39.0–52.0)
HEMOGLOBIN: 14.3 g/dL (ref 13.0–17.0)
MCH: 31.1 pg (ref 26.0–34.0)
MCHC: 36.1 g/dL — ABNORMAL HIGH (ref 30.0–36.0)
MCV: 86.1 fL (ref 78.0–100.0)
PLATELETS: 327 10*3/uL (ref 150–400)
RBC: 4.6 MIL/uL (ref 4.22–5.81)
RDW: 12.5 % (ref 11.5–15.5)
WBC: 9.1 10*3/uL (ref 4.0–10.5)

## 2015-11-05 LAB — GLUCOSE, CAPILLARY
GLUCOSE-CAPILLARY: 218 mg/dL — AB (ref 65–99)
GLUCOSE-CAPILLARY: 289 mg/dL — AB (ref 65–99)
Glucose-Capillary: 404 mg/dL — ABNORMAL HIGH (ref 65–99)
Glucose-Capillary: 231 mg/dL — ABNORMAL HIGH (ref 65–99)
Glucose-Capillary: 253 mg/dL — ABNORMAL HIGH (ref 65–99)
Glucose-Capillary: 234 mg/dL — ABNORMAL HIGH (ref 65–99)
Glucose-Capillary: 191 mg/dL — ABNORMAL HIGH (ref 65–99)
Glucose-Capillary: 193 mg/dL — ABNORMAL HIGH (ref 65–99)
Glucose-Capillary: 214 mg/dL — ABNORMAL HIGH (ref 65–99)
Glucose-Capillary: 155 mg/dL — ABNORMAL HIGH (ref 65–99)

## 2015-11-05 LAB — RAPID URINE DRUG SCREEN, HOSP PERFORMED
AMPHETAMINES: POSITIVE — AB
Barbiturates: NOT DETECTED
Benzodiazepines: NOT DETECTED
Cocaine: NOT DETECTED
OPIATES: NOT DETECTED
Tetrahydrocannabinol: NOT DETECTED

## 2015-11-05 LAB — LACTIC ACID, PLASMA: Lactic Acid, Venous: 1.6 mmol/L (ref 0.5–2.0)

## 2015-11-05 LAB — MAGNESIUM: MAGNESIUM: 1.6 mg/dL — AB (ref 1.7–2.4)

## 2015-11-05 LAB — TROPONIN I: Troponin I: <0.03 ng/mL (ref <0.031)

## 2015-11-05 LAB — TSH: TSH: 0.658 u[IU]/mL (ref 0.350–4.500)

## 2015-11-05 LAB — MRSA PCR SCREENING: MRSA by PCR: NEGATIVE

## 2015-11-05 MED ORDER — QUETIAPINE FUMARATE 100 MG PO TABS
100.0000 mg | ORAL_TABLET | Freq: Two times a day (BID) | ORAL | Status: DC
  Administered 2015-11-05 – 2015-11-08 (×8): 100 mg via ORAL
  Filled 2015-11-05 (×9): qty 1

## 2015-11-05 MED ORDER — ACETAMINOPHEN 325 MG PO TABS: 650.0000 mg | ORAL_TABLET | Freq: Four times a day (QID) | ORAL | Status: DC | PRN

## 2015-11-05 MED ORDER — AMPHETAMINE-DEXTROAMPHETAMINE 10 MG PO TABS
40.0000 mg | ORAL_TABLET | Freq: Two times a day (BID) | ORAL | Status: DC
  Administered 2015-11-05 – 2015-11-08 (×7): 40 mg via ORAL
  Filled 2015-11-05 (×7): qty 4

## 2015-11-05 MED ORDER — ONDANSETRON HCL 4 MG/2ML IJ SOLN: 4.0000 mg | Freq: Four times a day (QID) | INTRAMUSCULAR | Status: DC | PRN

## 2015-11-05 MED ORDER — GLUCERNA SHAKE PO LIQD
237.0000 mL | Freq: Three times a day (TID) | ORAL | Status: DC
  Administered 2015-11-05 – 2015-11-08 (×9): 237 mL via ORAL
  Filled 2015-11-05 (×11): qty 237

## 2015-11-05 MED ORDER — INSULIN ASPART PROT & ASPART (70-30 MIX) 100 UNIT/ML ~~LOC~~ SUSP
40.0000 [IU] | Freq: Two times a day (BID) | SUBCUTANEOUS | Status: DC
  Administered 2015-11-05 – 2015-11-08 (×7): 40 [IU] via SUBCUTANEOUS
  Filled 2015-11-05: qty 10

## 2015-11-05 MED ORDER — DOXEPIN HCL 100 MG PO CAPS
100.0000 mg | ORAL_CAPSULE | Freq: Every day | ORAL | Status: DC
  Administered 2015-11-05 – 2015-11-07 (×4): 100 mg via ORAL
  Filled 2015-11-05 (×6): qty 1

## 2015-11-05 MED ORDER — SODIUM CHLORIDE 0.9 % IV SOLN
INTRAVENOUS | Status: DC
  Administered 2015-11-05: 2.5 [IU]/h via INTRAVENOUS
  Administered 2015-11-05: 3.7 [IU]/h via INTRAVENOUS
  Administered 2015-11-05: 3.3 [IU]/h via INTRAVENOUS
  Filled 2015-11-05: qty 2.5

## 2015-11-05 MED ORDER — SODIUM CHLORIDE 0.9 % IV SOLN
1000.0000 mL | INTRAVENOUS | Status: DC
  Administered 2015-11-05: 1000 mL via INTRAVENOUS

## 2015-11-05 MED ORDER — ONDANSETRON HCL 4 MG PO TABS: 4.0000 mg | ORAL_TABLET | Freq: Four times a day (QID) | ORAL | Status: DC | PRN

## 2015-11-05 MED ORDER — DEXTROSE-NACL 5-0.45 % IV SOLN
INTRAVENOUS | Status: DC
  Administered 2015-11-05: 08:00:00 via INTRAVENOUS

## 2015-11-05 MED ORDER — TRAZODONE HCL 100 MG PO TABS
200.0000 mg | ORAL_TABLET | Freq: Every day | ORAL | Status: DC
  Administered 2015-11-05 – 2015-11-07 (×4): 200 mg via ORAL
  Filled 2015-11-05 (×4): qty 2
  Filled 2015-11-05: qty 4

## 2015-11-05 MED ORDER — ENOXAPARIN SODIUM 40 MG/0.4ML ~~LOC~~ SOLN
40.0000 mg | SUBCUTANEOUS | Status: DC
  Administered 2015-11-05 – 2015-11-08 (×4): 40 mg via SUBCUTANEOUS
  Filled 2015-11-05 (×4): qty 0.4

## 2015-11-05 MED ORDER — ACETAMINOPHEN 650 MG RE SUPP: 650.0000 mg | Freq: Four times a day (QID) | RECTAL | Status: DC | PRN

## 2015-11-05 NOTE — Progress Notes (Signed)
Inpatient Diabetes Program Recommendations  AACE/ADA: New Consensus Statement on Inpatient Glycemic Control (2015)  Target Ranges:  Prepandial:   less than 140 mg/dL      Peak postprandial:   less than 180 mg/dL (1-2 hours)      Critically ill patients:  140 - 180 mg/dL   Review of Glycemic Control   **Patient is well known to the Inpatient Glycemic Control Team. He has multiple admissions and ED visits since 11/2014 (7 admissions and 5 ED visits- for both Psych issues and Hyperglycemia, Running out of insulin, etc). Transitioned off insulin drip and 70/30 40 units given.   Results for JAHMIRE, RUFFINS (MRN [id number]) as of 11/05/2015 12:45  Ref. Range 11/05/2015 06:14 11/05/2015 07:19 11/05/2015 09:24 11/05/2015 10:28 11/05/2015 12:03  Glucose-Capillary Latest Ref Range: 65-99 mg/dL 944 (H) 967 (H) 591 (H) 193 (H) 289 (H)     MD-   Recommend Novolog Moderate SSI tidwc and hs.   Not sure what else to do for this patient. He will take his insulin intermittently at home and note that patient has a history of psychiatric illness.  Note that patient claims he cannot afford his insulin. Please provide patient with a Rx for Novolin Reli-on 70/30 insulin at time of d/c [Order #[id number]]. Patient can purchase this particular brand of 70/30 insulin at [facility] for $25 per vial.  Thank you. Ailene Ards, RD, LDN, CDE Inpatient Diabetes Coordinator [phone]

## 2015-11-05 NOTE — Care Management Note (Signed)
Case Management Note  Patient Details  Name: [name] MRN: [id number] Date of Birth: Nov 22, 1986  Subjective/Objective:                 dka iv insulin infusion   Action/Plan:  Will follow for level of care and needs   Expected Discharge Date:                  Expected Discharge Plan:  Home/Self Care  In-House Referral:  NA  Discharge planning Services  CM Consult  Post Acute Care Choice:    Choice offered to:     DME Arranged:    DME Agency:     HH Arranged:    HH Agency:     Status of Service:  In process, will continue to follow  Medicare Important Message Given:    Date Medicare IM Given:    Medicare IM give by:    Date Additional Medicare IM Given:    Additional Medicare Important Message give by:     If discussed at Long Length of Stay Meetings, dates discussed:    Additional Comments:  Golda Acre, RN 11/05/2015, 10:10 AM

## 2015-11-05 NOTE — Progress Notes (Addendum)
Patient seen and examined, sinus tachycardia, blood sugar has improved on insulin drip, no anion gap, will transition to long acting insulin, diabetic education/care manager consult , diabetes management is challenging due to underline psychiatric disorder and financial restrain. Currently patient is pleasant, does not appear anxious, denies SI/HI, reported chronic left shoulder pain, he reported run out of insulin for the last two months. Repeat bmp in pm.

## 2015-11-05 NOTE — H&P (Signed)
[facility] History and Physical  [name] PQZ:[id number] DOB: [birth year] DOA: 11/04/2015  Referring physician: Dr. Anitra Lauth. PCP: Jeanine Luz, FNP  Specialists: None.  Chief Complaint: Elevated blood sugar.  HPI: [name] is a 29 y.o. male with history of diabetes mellitus type 1, schizoaffective disorder as per the chart, tobacco abuse presents to the ER because of elevated blood sugar. Patient states he has been off his insulin for last 2 months as he has run out of his medications and has not followed up with his PCP. Patient's blood sugars found to be elevated more than 800 with tachycardia and hyponatremia. Patient is not in DKA. Denies any chest pain shortness of breath nausea vomiting diarrhea or abdominal pain or fever chills. Patient has been started on IV fluid bolus and admitted for hyperosmolar nonketotic state.  Review of Systems: As presented in the history of presenting illness, rest negative.  Past Medical History  Diagnosis Date  . Depression   . Schizo affective schizophrenia ([name])   . Polysubstance abuse   . Scoliosis   . Chronic pain   . Noncompliance with medication regimen   . GERD (gastroesophageal reflux disease)   . Arthritis     "both hips" (12/21/2014)  . Asthma     Childhood  . Chicken pox   . Diabetes mellitus     Type 1 diabetes  . DKA (diabetic ketoacidoses) ([name]) 11/18/2014  . Hypertension    Past Surgical History  Procedure Laterality Date  . Hand surgery Left     Left thumb surgery   Social History:  reports that he has been smoking Cigarettes.  He has a 7 pack-year smoking history. He has never used smokeless tobacco. He reports that he does not drink alcohol or use illicit drugs. Where does patient live home. Can patient participate in ADLs? Yes.  Allergies  Allergen Reactions  . Sulfa Antibiotics Other (See Comments)    Unknown - Childhood    Family History:  Family History  Problem Relation Age of  Onset  . Diabetes Mother       Prior to Admission medications   Medication Sig Start Date End Date Taking? Authorizing Provider  amphetamine-dextroamphetamine (ADDERALL) 20 MG tablet Take 2 tablets (40 mg total) by mouth 2 (two) times daily. 10/27/15  Yes Veryl Speak, FNP  doxepin (SINEQUAN) 100 MG capsule Take 1 capsule (100 mg total) by mouth at bedtime. 08/19/15  Yes Veryl Speak, FNP  QUEtiapine (SEROQUEL) 100 MG tablet Take 1 tablet (100 mg total) by mouth 2 (two) times daily. 08/19/15  Yes Veryl Speak, FNP  traZODone (DESYREL) 100 MG tablet Take 2 tablets (200 mg total) by mouth at bedtime. 10/27/15  Yes Veryl Speak, FNP  FREESTYLE LITE test strip TEST BLOOD SUGARS 3 TIMES DAILY AND WHEN NEEDED 03/29/15   Historical Provider, MD  insulin NPH-regular Human (NOVOLIN 70/30) (70-30) 100 UNIT/ML injection Inject 40 Units into the skin 2 (two) times daily with a meal. [facility] has [employer] brand Insulin Patient not taking: Reported on 11/04/2015 06/03/15   Elease Etienne, MD  Insulin Syringe-Needle U-100 (SAFETY-GLIDE 0.3CC SYR 29GX1/2) 29G X 1/2" 0.3 ML MISC Use as directed. 06/03/15   Elease Etienne, MD  Lancets (FREESTYLE) lancets TEST 3 TIMES DAILY AND WHEN NEEDED 03/29/15   Historical Provider, MD  NAPROSYN 500 MG tablet Take 1 tablet (500 mg total) by mouth 2 (two) times daily with a meal. Patient not taking: Reported on 11/04/2015  07/22/15   Veryl Speak, FNP  omeprazole (PRILOSEC) 20 MG capsule Take 1 capsule (20 mg total) by mouth daily. Patient not taking: Reported on 11/04/2015 08/19/15   Veryl Speak, FNP    Physical Exam: Filed Vitals:   11/04/15 2104 11/04/15 2200 11/05/15 0021  BP: 132/93 137/102 128/86  Pulse: 111 117 124  Temp: 98.3 F (36.8 C)    TempSrc: Oral    Resp: 22  19  SpO2: 97% 99% 96%     General:  Moderately built and poorly nourished.  Eyes: Anicteric nonpalpable.  ENT: No discharge from the ears eyes nose and mouth.  Neck: No  mass felt.  Cardiovascular: S1 and S2 heard.  Respiratory: No rhonchi or crepitations.  Abdomen: Soft nontender bowel sounds present.  Skin: No rash.  Musculoskeletal: No edema.  Psychiatric: Appears normal.  Neurologic: Alert awake oriented to time place and person. Moves all extremities.  Labs on Admission:  Basic Metabolic Panel:  Recent Labs Lab 11/04/15 2125  NA 126*  K 5.0  CL 87*  CO2 29  GLUCOSE 860*  BUN 16  CREATININE 0.78  CALCIUM 9.5   Liver Function Tests: No results for input(s): AST, ALT, ALKPHOS, BILITOT, PROT, ALBUMIN in the last 168 hours. No results for input(s): LIPASE, AMYLASE in the last 168 hours. No results for input(s): AMMONIA in the last 168 hours. CBC:  Recent Labs Lab 11/04/15 2125  WBC 7.5  HGB 15.3  HCT 43.4  MCV 87.9  PLT 330   Cardiac Enzymes: No results for input(s): CKTOTAL, CKMB, CKMBINDEX, TROPONINI in the last 168 hours.  BNP (last 3 results) No results for input(s): BNP in the last 8760 hours.  ProBNP (last 3 results) No results for input(s): PROBNP in the last 8760 hours.  CBG:  Recent Labs Lab 11/04/15 2112  GLUCAP >600*    Radiological Exams on Admission: Dg Shoulder Left  11/04/2015  CLINICAL DATA:  Chronic left shoulder pain for 3-6 months. Initial encounter. EXAM: LEFT SHOULDER - 2+ VIEW COMPARISON:  Chest radiograph performed 01/31/2015 FINDINGS: There is no evidence of fracture or dislocation. Mild cortical irregularity is noted along the anterior aspect of the left humeral head, possibly reflecting mild degenerative change. The left humeral head is seated within the glenoid fossa. The acromioclavicular joint is unremarkable in appearance. No significant soft tissue abnormalities are seen. The visualized portions of the left lung are clear. IMPRESSION: 1. No evidence of fracture or dislocation. 2. Mild cortical irregularity along the anterior aspect of the left humeral head, possibly reflecting mild  degenerative change. Given the chronicity of the patient's symptoms, MRI could be considered for further evaluation, as deemed clinically appropriate. Electronically Signed   By: Roanna Raider M.D.   On: 11/04/2015 22:26     Assessment/Plan Principal Problem:   Diabetes mellitus with nonketotic hyperosmolarity ([name]) Active Problems:   Hyponatremia   Hyperglycemia   Schizoaffective disorder, unspecified type ([name])   1. Diabetes mellitus type 1 uncontrolled with hyperosmolar nonketotic state - patient has been placed on IV fluid boluses and started on IV insulin infusion. Once blood sugar is less than 250 will change back to long-acting insulin. Patient's blood sugar has been uncontrolled probably from being noncompliant with his medications which he has not taken for last 2 months. We will get case manager and social worker consult. Patient advised to be compliant with his medications. Check hemoglobin A1c. Chest x-ray EKG cardiac markers urine drug screens TSH are pending. 2. Schizoaffective  disorder - patient will be continued on his home dose of psychiatric medications including trazodone and Adderall and Seroquel. Reviewing patient's primary care notes primary care is planned to refer him to psychiatrist. 3. Tobacco abuse - patient advised to quit smoking. Follow urine drug screen. 4. Tachycardia probably from dehydration - follow EKG. Posterior Martin telemetry. Check TSH. 5. Hyponatremia probably from uncontrolled diabetes and dehydration - I think patient's sodium will improve with correction of his sodium and dehydration.  I have reviewed patient's old charts and labs.   DVT Prophylaxis Lovenox.  Code Status: Full code.  Family Communication: Discussed with patient.  Disposition Plan: Admit to inpatient.    Billiejo Sorto N. [facility] Pager [phone].  If 7PM-7AM, please contact night-coverage [email or web address] Password [email or web address] 11/05/2015, 1:01 AM

## 2015-11-05 NOTE — Consult Note (Signed)
   [facility] Inpatient Consult   11/05/2015  SRIJAN GIVAN 1986-08-11 [id number]    EPIC referral received for [facility] services. Went to bedside to speak with patient. He endorses he has issues with affording insulin and with transportation to appointments. He goes to [facility] on [address]. He reports he lives alone. States his best contact number is [phone]. He is agreeable to [facility] services and follow up. Consents signed. Will request for patient to be assigned to [facility] RNCM, [facility] [name], and [facility] Licensed CSW. Will make inpatient RNCM aware.  Raiford Noble, MSN-Ed, RN,BSN [facility] Liaison [phone]

## 2015-11-05 NOTE — Progress Notes (Signed)
Initial Nutrition Assessment  DOCUMENTATION CODES:   Severe malnutrition in context of chronic illness  INTERVENTION:  Glucerna Shake po TID, each supplement provides 220 kcal and 10 grams of protein  NUTRITION DIAGNOSIS:   Malnutrition related to poor appetite, other (see comment) (Poor management of T1DM) as evidenced by percent weight loss.  GOAL:   Patient will meet greater than or equal to 90% of their needs  MONITOR:   PO intake, I & O's, Labs, Supplement acceptance  REASON FOR ASSESSMENT:   Malnutrition Screening Tool    ASSESSMENT:   [name] is a 29 y.o. male with history of diabetes mellitus type 1, schizoaffective disorder as per the chart, tobacco abuse presents to the ER because of elevated blood sugar. Patient states he has been off his insulin for last 2 months as he has run out of his medications and has not followed up with his PCP. Patient's blood sugars found to be elevated more than 800 with tachycardia and hyponatremia. Patient is not in DKA  Spoke with pt at bedside.  Pt admitted to poor oral intake secondary to poor control of t1dm. Stated at first he felt nauseous but eventually felt ok.  Pt stated appetite improved overtime, but he continued to lose weight.  Likely related to constantly high blood sugar, body utilized fat stores for energy.  Pt stated schitzoaffective disorder had no affects on intake.  Nutrition-Focused physical exam completed. Findings are severe fat depletion, severe muscle depletion, and no edema.   Will provide Glucerna Shake during stay.   Diet Order:  Diet Carb Modified Fluid consistency:: Thin; Room service appropriate?: Yes  Skin:  Reviewed, no issues  Last BM:  PTA  Height:   Ht Readings from Last 1 Encounters:  11/05/15 6\' 2"  (1.88 m)    Weight:   Wt Readings from Last 1 Encounters:  11/05/15 122 lb 2.2 oz (55.4 kg)    Ideal Body Weight:  86.36 kg  BMI:  Body mass index is 15.67  kg/(m^2).  Estimated Nutritional Needs:   Kcal:  1900-2200 calories  Protein:  70-80 calories  Fluid:  >/= 1.9L  EDUCATION NEEDS:   No education needs identified at this time 11/07/15. Christopher Bingman, MS, RD LDN After Hours/Weekend Pager [phone]

## 2015-11-06 DIAGNOSIS — F259 Schizoaffective disorder, unspecified: Secondary | ICD-10-CM

## 2015-11-06 DIAGNOSIS — Z9114 Patient's other noncompliance with medication regimen: Secondary | ICD-10-CM

## 2015-11-06 LAB — BASIC METABOLIC PANEL
ANION GAP: 7 (ref 5–15)
BUN: 23 mg/dL — AB (ref 6–20)
CHLORIDE: 99 mmol/L — AB (ref 101–111)
CO2: 29 mmol/L (ref 22–32)
Calcium: 8.7 mg/dL — ABNORMAL LOW (ref 8.9–10.3)
Creatinine, Ser: 0.53 mg/dL — ABNORMAL LOW (ref 0.61–1.24)
GFR calc non Af Amer: >60 mL/min (ref >60)
Glucose, Bld: 264 mg/dL — ABNORMAL HIGH (ref 65–99)
POTASSIUM: 3.9 mmol/L (ref 3.5–5.1)
Sodium: 135 mmol/L (ref 135–145)

## 2015-11-06 LAB — CBC
HEMATOCRIT: 37.3 % — AB (ref 39.0–52.0)
Hemoglobin: 13.2 g/dL (ref 13.0–17.0)
MCH: 31.1 pg (ref 26.0–34.0)
MCHC: 35.4 g/dL (ref 30.0–36.0)
MCV: 87.8 fL (ref 78.0–100.0)
Platelets: 241 10*3/uL (ref 150–400)
RBC: 4.25 MIL/uL (ref 4.22–5.81)
RDW: 12.6 % (ref 11.5–15.5)
WBC: 6.9 10*3/uL (ref 4.0–10.5)

## 2015-11-06 LAB — HEMOGLOBIN A1C
HEMOGLOBIN A1C: 14.6 % — AB (ref 4.8–5.6)
MEAN PLASMA GLUCOSE: 372 mg/dL

## 2015-11-06 LAB — GLUCOSE, CAPILLARY
GLUCOSE-CAPILLARY: 215 mg/dL — AB (ref 65–99)
GLUCOSE-CAPILLARY: 119 mg/dL — AB (ref 65–99)
GLUCOSE-CAPILLARY: 196 mg/dL — AB (ref 65–99)
Glucose-Capillary: 147 mg/dL — ABNORMAL HIGH (ref 65–99)

## 2015-11-06 LAB — MAGNESIUM: Magnesium: 1.6 mg/dL — ABNORMAL LOW (ref 1.7–2.4)

## 2015-11-06 MED ORDER — DIPHENHYDRAMINE HCL 25 MG PO CAPS
25.0000 mg | ORAL_CAPSULE | Freq: Three times a day (TID) | ORAL | Status: DC | PRN
  Administered 2015-11-07 (×3): 25 mg via ORAL
  Filled 2015-11-06 (×3): qty 1

## 2015-11-06 MED ORDER — MAGNESIUM SULFATE 2 GM/50ML IV SOLN
2.0000 g | Freq: Once | INTRAVENOUS | Status: AC
  Administered 2015-11-06: 2 g via INTRAVENOUS
  Filled 2015-11-06: qty 50

## 2015-11-06 NOTE — Progress Notes (Signed)
Pt threatening to leave AMA if he can not get unlimited snacks, he has had a total of two sandwiches, four cups of coffee, and 6 gram crackers . Admitted with DKA CBG's in the 800's on admission. Informed the Pt that he, should be conscious about his caloric intake. Found having a conversation with himself, yelling and screaming at a very loud volume, asking the staff members to leave his room, becoming suspious of everyone,continues to  call everyone a liar. Pt's states " Why can't I have what I want, [place]. " Made the Charge Nurse and [facility] Joe aware. Will continue to monitor and redirect.

## 2015-11-06 NOTE — Progress Notes (Signed)
PROGRESS NOTE  [name] UMP:[id number] DOB: November 23, 1986 DOA: 11/04/2015 PCP: Jeanine Luz, FNP  HPI/Recap of past 24 hours: Very paranoid, agitated,   Assessment/Plan: Principal Problem:   Diabetes mellitus with nonketotic hyperosmolarity ([name]) Active Problems:   Hyponatremia   Hyperglycemia   Schizoaffective disorder, unspecified type ([name])  Hyperglycemia: due to meds noncompliance, better, reported not able to afford meds, even from [employer] brand.  Paranoia/agitation: psychiatry consulted  Code Status: full  Family Communication: patient   Disposition Plan: home vs psych placement?   Consultants:  psychiatry  Procedures:  none  Antibiotics:  none   Objective: BP 113/67 mmHg  Pulse 114  Temp(Src) 98.2 F (36.8 C) (Oral)  Resp 18  Ht 6\' 2"  (1.88 m)  Wt 122 lb 2.2 oz (55.4 kg)  BMI 15.67 kg/m2  SpO2 98%  Intake/Output Summary (Last 24 hours) at 11/06/15 1928 Last data filed at 11/06/15 1804  Gross per 24 hour  Intake   1200 ml  Output      0 ml  Net   1200 ml   Filed Weights   11/05/15 0153  Weight: 122 lb 2.2 oz (55.4 kg)    Exam:   General:  NAD  Cardiovascular: RRR  Respiratory: CTABL  Abdomen: Soft/ND/NT, positive BS  Musculoskeletal: No Edema  Neuro: aaox3,  Psych: paranoia, agitation  Data Reviewed: Basic Metabolic Panel:  Recent Labs Lab 11/05/15 0205 11/05/15 0515 11/05/15 0906 11/05/15 1426 11/06/15 0855  NA 134* 138 138 137 135  K 3.8 3.2* 3.3* 3.6 3.9  CL 98* 102 102 100* 99*  CO2 30 30 29 31 29   GLUCOSE 294* 216* 213* 97 264*  BUN 17 19 18 18  23*  CREATININE 0.53* 0.44* 0.54* 0.77 0.53*  CALCIUM 9.4 9.1 8.9 8.7* 8.7*  MG  --   --   --  1.6* 1.6*   Liver Function Tests: No results for input(s): AST, ALT, ALKPHOS, BILITOT, PROT, ALBUMIN in the last 168 hours. No results for input(s): LIPASE, AMYLASE in the last 168 hours. No results for input(s): AMMONIA in the last 168 hours. CBC:  Recent  Labs Lab 11/04/15 2125 11/05/15 0205 11/05/15 0515 11/06/15 0855  WBC 7.5 9.1 7.6 6.9  NEUTROABS  --   --  4.7  --   HGB 15.3 14.3 12.6* 13.2  HCT 43.4 39.6 35.7* 37.3*  MCV 87.9 86.1 86.2 87.8  PLT 330 327 276 241   Cardiac Enzymes:    Recent Labs Lab 11/05/15 0205  TROPONINI <0.03   BNP (last 3 results) No results for input(s): BNP in the last 8760 hours.  ProBNP (last 3 results) No results for input(s): PROBNP in the last 8760 hours.  CBG:  Recent Labs Lab 11/05/15 1653 11/05/15 1948 11/06/15 0814 11/06/15 1215 11/06/15 1703  GLUCAP 214* 155* 215* 119* 196*    Recent Results (from the past 240 hour(s))  MRSA PCR Screening     Status: None   Collection Time: 11/05/15  1:07 AM  Result Value Ref Range Status   MRSA by PCR NEGATIVE NEGATIVE Final    Comment:        The GeneXpert MRSA Assay (FDA approved for NASAL specimens only), is one component of a comprehensive MRSA colonization surveillance program. It is not intended to diagnose MRSA infection nor to guide or monitor treatment for MRSA infections.      Studies: No results found.  Scheduled Meds: . amphetamine-dextroamphetamine  40 mg Oral BID  . doxepin  100  mg Oral QHS  . enoxaparin (LOVENOX) injection  40 mg Subcutaneous Q24H  . feeding supplement (GLUCERNA SHAKE)  237 mL Oral TID BM  . insulin aspart protamine- aspart  40 Units Subcutaneous BID WC  . QUEtiapine  100 mg Oral BID  . traZODone  200 mg Oral QHS    Continuous Infusions:    Time spent:  Jolina Symonds MD, PhD  [facility] Pager [phone]. If 7PM-7AM, please contact night-coverage at [email or web address], password [facility] 11/06/2015, 7:28 PM  LOS: 2 days

## 2015-11-07 DIAGNOSIS — Z9119 Patient's noncompliance with other medical treatment and regimen: Secondary | ICD-10-CM

## 2015-11-07 LAB — BASIC METABOLIC PANEL
ANION GAP: 6 (ref 5–15)
BUN: 24 mg/dL — ABNORMAL HIGH (ref 6–20)
CHLORIDE: 101 mmol/L (ref 101–111)
CO2: 29 mmol/L (ref 22–32)
CREATININE: 0.47 mg/dL — AB (ref 0.61–1.24)
Calcium: 8.6 mg/dL — ABNORMAL LOW (ref 8.9–10.3)
GFR calc non Af Amer: >60 mL/min (ref >60)
Glucose, Bld: 307 mg/dL — ABNORMAL HIGH (ref 65–99)
Potassium: 4.1 mmol/L (ref 3.5–5.1)
SODIUM: 136 mmol/L (ref 135–145)

## 2015-11-07 LAB — GLUCOSE, CAPILLARY
GLUCOSE-CAPILLARY: 327 mg/dL — AB (ref 65–99)
GLUCOSE-CAPILLARY: 231 mg/dL — AB (ref 65–99)
GLUCOSE-CAPILLARY: 212 mg/dL — AB (ref 65–99)
GLUCOSE-CAPILLARY: 269 mg/dL — AB (ref 65–99)

## 2015-11-07 LAB — MAGNESIUM: MAGNESIUM: 1.9 mg/dL (ref 1.7–2.4)

## 2015-11-07 MED ORDER — INSULIN ASPART 100 UNIT/ML ~~LOC~~ SOLN
0.0000 [IU] | Freq: Three times a day (TID) | SUBCUTANEOUS | Status: DC
  Administered 2015-11-07 – 2015-11-08 (×3): 5 [IU] via SUBCUTANEOUS
  Administered 2015-11-08: 3 [IU] via SUBCUTANEOUS

## 2015-11-07 NOTE — Progress Notes (Signed)
PROGRESS NOTE  [name] WJX:[id number] DOB: May 13, 1986 DOA: 11/04/2015 PCP: Jeanine Luz, FNP  HPI/Recap of past 24 hours:  Very paranoid, flight of ideas, tangential though process Am blood sugar elevated, patient does eat large amount of diet  Assessment/Plan: Principal Problem:   Diabetes mellitus with nonketotic hyperosmolarity ([name]) Active Problems:   Hyponatremia   Hyperglycemia   Schizoaffective disorder, unspecified type ([name])  Hyperglycemia: due to meds noncompliance, better, reported not able to afford meds, even from [employer] brand. Continue titrate insulin here, but managing this patient 's diabetes is very chanllenging due to underline psychiatric issues/financial constrains/inconsistent diet intake.  Paranoia/agitation: psychiatry consulted  Code Status: full  Family Communication: patient   Disposition Plan: home vs psych placement?   Consultants:  psychiatry  Procedures:  none  Antibiotics:  none   Objective: BP 100/61 mmHg  Pulse 97  Temp(Src) 97.7 F (36.5 C) (Oral)  Resp 18  Ht 6\' 2"  (1.88 m)  Wt 135 lb 12.9 oz (61.6 kg)  BMI 17.43 kg/m2  SpO2 97%  Intake/Output Summary (Last 24 hours) at 11/07/15 1311 Last data filed at 11/07/15 0900  Gross per 24 hour  Intake   1320 ml  Output    400 ml  Net    920 ml   Filed Weights   11/05/15 0153 11/07/15 0517  Weight: 122 lb 2.2 oz (55.4 kg) 135 lb 12.9 oz (61.6 kg)    Exam:   General:  NAD  Cardiovascular: RRR  Respiratory: CTABL  Abdomen: Soft/ND/NT, positive BS  Musculoskeletal: No Edema  Neuro: aaox3,  Psych: paranoia, agitation  Data Reviewed: Basic Metabolic Panel:  Recent Labs Lab 11/05/15 0515 11/05/15 0906 11/05/15 1426 11/06/15 0855 11/07/15 0504  NA 138 138 137 135 136  K 3.2* 3.3* 3.6 3.9 4.1  CL 102 102 100* 99* 101  CO2 30 29 31 29 29   GLUCOSE 216* 213* 97 264* 307*  BUN 19 18 18  23* 24*  CREATININE 0.44* 0.54* 0.77 0.53* 0.47*    CALCIUM 9.1 8.9 8.7* 8.7* 8.6*  MG  --   --  1.6* 1.6* 1.9   Liver Function Tests: No results for input(s): AST, ALT, ALKPHOS, BILITOT, PROT, ALBUMIN in the last 168 hours. No results for input(s): LIPASE, AMYLASE in the last 168 hours. No results for input(s): AMMONIA in the last 168 hours. CBC:  Recent Labs Lab 11/04/15 2125 11/05/15 0205 11/05/15 0515 11/06/15 0855  WBC 7.5 9.1 7.6 6.9  NEUTROABS  --   --  4.7  --   HGB 15.3 14.3 12.6* 13.2  HCT 43.4 39.6 35.7* 37.3*  MCV 87.9 86.1 86.2 87.8  PLT 330 327 276 241   Cardiac Enzymes:    Recent Labs Lab 11/05/15 0205  TROPONINI <0.03   BNP (last 3 results) No results for input(s): BNP in the last 8760 hours.  ProBNP (last 3 results) No results for input(s): PROBNP in the last 8760 hours.  CBG:  Recent Labs Lab 11/06/15 1215 11/06/15 1703 11/06/15 2212 11/07/15 0751 11/07/15 1229  GLUCAP 119* 196* 147* 327* 231*    Recent Results (from the past 240 hour(s))  MRSA PCR Screening     Status: None   Collection Time: 11/05/15  1:07 AM  Result Value Ref Range Status   MRSA by PCR NEGATIVE NEGATIVE Final    Comment:        The GeneXpert MRSA Assay (FDA approved for NASAL specimens only), is one component of a comprehensive MRSA colonization  surveillance program. It is not intended to diagnose MRSA infection nor to guide or monitor treatment for MRSA infections.      Studies: No results found.  Scheduled Meds: . amphetamine-dextroamphetamine  40 mg Oral BID  . doxepin  100 mg Oral QHS  . enoxaparin (LOVENOX) injection  40 mg Subcutaneous Q24H  . feeding supplement (GLUCERNA SHAKE)  237 mL Oral TID BM  . insulin aspart  0-15 Units Subcutaneous TID WC  . insulin aspart protamine- aspart  40 Units Subcutaneous BID WC  . QUEtiapine  100 mg Oral BID  . traZODone  200 mg Oral QHS    Continuous Infusions:    Time spent:  Alphonso Gregson MD, PhD  [facility] Pager [phone]. If 7PM-7AM,  please contact night-coverage at [email or web address], password [facility] 11/07/2015, 1:11 PM  LOS: 3 days

## 2015-11-08 DIAGNOSIS — R739 Hyperglycemia, unspecified: Secondary | ICD-10-CM

## 2015-11-08 LAB — BASIC METABOLIC PANEL
ANION GAP: 7 (ref 5–15)
BUN: 26 mg/dL — ABNORMAL HIGH (ref 6–20)
CHLORIDE: 102 mmol/L (ref 101–111)
CO2: 29 mmol/L (ref 22–32)
Calcium: 9.1 mg/dL (ref 8.9–10.3)
Creatinine, Ser: 0.51 mg/dL — ABNORMAL LOW (ref 0.61–1.24)
GFR calc non Af Amer: >60 mL/min (ref >60)
Glucose, Bld: 218 mg/dL — ABNORMAL HIGH (ref 65–99)
POTASSIUM: 3.9 mmol/L (ref 3.5–5.1)
Sodium: 138 mmol/L (ref 135–145)

## 2015-11-08 LAB — GLUCOSE, CAPILLARY
GLUCOSE-CAPILLARY: 233 mg/dL — AB (ref 65–99)
Glucose-Capillary: 176 mg/dL — ABNORMAL HIGH (ref 65–99)

## 2015-11-08 MED ORDER — GLUCERNA SHAKE PO LIQD: 237.0000 mL | Freq: Three times a day (TID) | ORAL | Status: DC

## 2015-11-08 MED ORDER — DIPHENHYDRAMINE HCL 25 MG PO CAPS: 25.0000 mg | ORAL_CAPSULE | Freq: Three times a day (TID) | ORAL | Status: DC | PRN

## 2015-11-08 MED ORDER — KETOROLAC TROMETHAMINE 15 MG/ML IJ SOLN
7.5000 mg | Freq: Once | INTRAMUSCULAR | Status: AC
  Administered 2015-11-08: 7.5 mg via INTRAVENOUS
  Filled 2015-11-08: qty 1

## 2015-11-08 NOTE — Progress Notes (Signed)
Pt complained about being discharged prior to MD addressing his L shoulder pain.  Pt requesting cortisone injection prior to discharge.  X-ray L shoulder completed on admission with no acute disease noted.  MD aware and requests for patient to follow up outpatient.  Spoke with patient in length regarding no acute disease per x-ray and out patient follow up.  Patient thanked me but stated he would be calling patient experience for follow up.  Pt aware of patient experience number for follow-up.  No further issues noted.

## 2015-11-08 NOTE — Consult Note (Signed)
[name]   Reason for [name]:  Medication management for schizophrenia Referring Physician:  Dr. Erlinda Hong Patient Identification: [name] MRN:  [id number] Principal Diagnosis: Schizoaffective disorder, unspecified type [facility]) Diagnosis:   Patient Active Problem List   Diagnosis Date Noted  . Uncontrolled diabetes mellitus ([place]) [E11.65] 11/05/2015  . Diabetes mellitus with nonketotic hyperosmolarity ([place]) [E11.00] 11/05/2015  . Schizoaffective disorder ([place]) [F25.9]   . Left shoulder pain [M25.512] 08/19/2015  . Generalized headache [R51] 07/22/2015  . Diabetic ketoacidosis without coma associated with type 1 diabetes mellitus ([place]) [E10.10]   . Bilateral hip bursitis [M70.71, M70.72] 05/26/2015  . Schizoaffective disorder, bipolar type ([place]) [F25.0]   . Depression [F32.9]   . Hip pain [M25.559] 03/29/2015  . Schizoaffective disorder, unspecified type ([place]) [F25.9] 03/29/2015  . Generalized anxiety disorder [F41.1] 03/29/2015  . Undifferentiated schizophrenia ([place]) [F20.3]   . Schizophrenia ([place]) [F20.9] 03/20/2015  . HCAP (healthcare-associated pneumonia) [J18.9] 01/23/2015  . Hyperglycemia [R73.9] 12/21/2014  . DKA (diabetic ketoacidoses) ([place]) [E13.10] 11/18/2014  . Hip pain, bilateral [M25.551, M25.552] 11/18/2014  . DKA, type 1 ([place]) [E10.10] 06/17/2012  . Hyponatremia [E87.1] 06/17/2012  . Dehydration [E86.0] 06/17/2012  . Diabetes type 1, uncontrolled ([place]) [E10.65] 02/19/2012    Total Time spent with patient: 30 minutes  Subjective:   [name] is a 29 y.o. male patient admitted with hyperglycemia and noncompliant with medication.  HPI: [name] is a 29 y.o. male seen, chart reviewed and case discussed with the Dr. Erlinda Hong and staff RN. Patient admitted to [facility] with the hyperglycemia, hyponatremia and diabetes mellitus with nonketotic take hyperosmolarity. Psychiatric consultation requested secondary to  history of schizoaffective disorder and bizarre paranoid behaviors while in the hospital. Patient refused to talk to the psychiatrist saying that he does not want to deal with the psychiatric professionals during this evaluation. Review of medical records indicated patient has previous acute psychiatric hospitalization at behavioral [facility] about 7 months ago for undifferentiated schizophrenia/schizoaffective disorder and received medication risperidone and Depakote. Patient is currently taking Seroquel and Adderall.  Past Psychiatric History: He has few acute psych admission at [facility] and his last admission was April 2016 for non compliant with medications and bizarre behaviors with delusional thoughts.  Risk to Self: Is patient at risk for suicide?: No Risk to Others:   Prior Inpatient Therapy:   Prior Outpatient Therapy:    Past Medical History:  Past Medical History  Diagnosis Date  . Depression   . Schizo affective schizophrenia ([place])   . Polysubstance abuse   . Scoliosis   . Chronic pain   . Noncompliance with medication regimen   . GERD (gastroesophageal reflux disease)   . Arthritis     "both hips" (12/21/2014)  . Asthma     Childhood  . Chicken pox   . Diabetes mellitus     Type 1 diabetes  . DKA (diabetic ketoacidoses) ([place]) 11/18/2014  . Hypertension     Past Surgical History  Procedure Laterality Date  . Hand surgery Left     Left thumb surgery   Family History:  Family History  Problem Relation Age of Onset  . Diabetes Mother    Family Psychiatric  History: Unknown Social History:  History  Alcohol Use No    Comment: 12/21/2014 "I drink on a rare occasion"     History  Drug Use No    Comment: 12/21/2014 "I use marijuana on a rare occasion", last used marijuana 01/30/2015  Social History   Social History  . Marital Status: Legally Separated    Spouse Name: N/A  . Number of Children: 0  . Years of Education: 13   Occupational History  . Disability     Social History Main Topics  . Smoking status: Current Some Day Smoker -- 1.00 packs/day for 7 years    Types: Cigarettes  . Smokeless tobacco: Never Used  . Alcohol Use: No     Comment: 12/21/2014 "I drink on a rare occasion"  . Drug Use: No     Comment: 12/21/2014 "I use marijuana on a rare occasion", last used marijuana 01/30/2015  . Sexual Activity: Yes   Other Topics Concern  . None   Social History Narrative   ** Merged History Encounter **    Fun: Video games      Additional Social History:                          Allergies:   Allergies  Allergen Reactions  . Sulfa Antibiotics Other (See Comments)    Unknown - Childhood    Labs:  Results for orders placed or performed during the hospital encounter of 11/04/15 (from the past 48 hour(s))  Glucose, capillary     Status: Abnormal   Collection Time: 11/06/15 12:15 PM  Result Value Ref Range   Glucose-Capillary 119 (H) 65 - 99 mg/dL  Glucose, capillary     Status: Abnormal   Collection Time: 11/06/15  5:03 PM  Result Value Ref Range   Glucose-Capillary 196 (H) 65 - 99 mg/dL   Comment 1 Notify RN   Glucose, capillary     Status: Abnormal   Collection Time: 11/06/15 10:12 PM  Result Value Ref Range   Glucose-Capillary 147 (H) 65 - 99 mg/dL  Basic metabolic panel     Status: Abnormal   Collection Time: 11/07/15  5:04 AM  Result Value Ref Range   Sodium 136 135 - 145 mmol/L   Potassium 4.1 3.5 - 5.1 mmol/L   Chloride 101 101 - 111 mmol/L   CO2 29 22 - 32 mmol/L   Glucose, Bld 307 (H) 65 - 99 mg/dL   BUN 24 (H) 6 - 20 mg/dL   Creatinine, Ser 0.47 (L) 0.61 - 1.24 mg/dL   Calcium 8.6 (L) 8.9 - 10.3 mg/dL   GFR calc non Af Amer >60 >60 mL/min   GFR calc Af Amer >60 >60 mL/min    Comment: (NOTE) The eGFR has been calculated using the CKD EPI equation. This calculation has not been validated in all clinical situations. eGFR's persistently <60 mL/min signify possible Chronic Kidney Disease.    Anion gap 6  5 - 15  Magnesium     Status: None   Collection Time: 11/07/15  5:04 AM  Result Value Ref Range   Magnesium 1.9 1.7 - 2.4 mg/dL  Glucose, capillary     Status: Abnormal   Collection Time: 11/07/15  7:51 AM  Result Value Ref Range   Glucose-Capillary 327 (H) 65 - 99 mg/dL  Glucose, capillary     Status: Abnormal   Collection Time: 11/07/15 12:29 PM  Result Value Ref Range   Glucose-Capillary 231 (H) 65 - 99 mg/dL  Glucose, capillary     Status: Abnormal   Collection Time: 11/07/15  4:36 PM  Result Value Ref Range   Glucose-Capillary 212 (H) 65 - 99 mg/dL  Glucose, capillary     Status: Abnormal  Collection Time: 11/07/15  9:28 PM  Result Value Ref Range   Glucose-Capillary 269 (H) 65 - 99 mg/dL  Basic metabolic panel     Status: Abnormal   Collection Time: 11/08/15  5:20 AM  Result Value Ref Range   Sodium 138 135 - 145 mmol/L   Potassium 3.9 3.5 - 5.1 mmol/L   Chloride 102 101 - 111 mmol/L   CO2 29 22 - 32 mmol/L   Glucose, Bld 218 (H) 65 - 99 mg/dL   BUN 26 (H) 6 - 20 mg/dL   Creatinine, Ser 0.51 (L) 0.61 - 1.24 mg/dL   Calcium 9.1 8.9 - 10.3 mg/dL   GFR calc non Af Amer >60 >60 mL/min   GFR calc Af Amer >60 >60 mL/min    Comment: (NOTE) The eGFR has been calculated using the CKD EPI equation. This calculation has not been validated in all clinical situations. eGFR's persistently <60 mL/min signify possible Chronic Kidney Disease.    Anion gap 7 5 - 15  Glucose, capillary     Status: Abnormal   Collection Time: 11/08/15  7:12 AM  Result Value Ref Range   Glucose-Capillary 233 (H) 65 - 99 mg/dL    Current Facility-Administered Medications  Medication Dose Route Frequency Provider Last Rate Last Dose  . acetaminophen (TYLENOL) tablet 650 mg  650 mg Oral Q6H PRN Rise Patience, MD       Or  . acetaminophen (TYLENOL) suppository 650 mg  650 mg Rectal Q6H PRN Rise Patience, MD      . amphetamine-dextroamphetamine (ADDERALL) tablet 40 mg  40 mg Oral BID  Rise Patience, MD   40 mg at 11/08/15 0829  . diphenhydrAMINE (BENADRYL) capsule 25 mg  25 mg Oral Q8H PRN Florencia Reasons, MD   25 mg at 11/07/15 2209  . doxepin (SINEQUAN) capsule 100 mg  100 mg Oral QHS Rise Patience, MD   100 mg at 11/07/15 2204  . enoxaparin (LOVENOX) injection 40 mg  40 mg Subcutaneous Q24H Rise Patience, MD   40 mg at 11/08/15 1011  . feeding supplement (GLUCERNA SHAKE) (GLUCERNA SHAKE) liquid 237 mL  237 mL Oral TID BM Satira Anis Ward, RD   237 mL at 11/08/15 1011  . insulin aspart (novoLOG) injection 0-15 Units  0-15 Units Subcutaneous TID WC Florencia Reasons, MD   5 Units at 11/08/15 [phone]  . insulin aspart protamine- aspart (NOVOLOG MIX 70/30) injection 40 Units  40 Units Subcutaneous BID WC Florencia Reasons, MD   40 Units at 11/08/15 0829  . ondansetron (ZOFRAN) tablet 4 mg  4 mg Oral Q6H PRN Rise Patience, MD       Or  . ondansetron [facility]) injection 4 mg  4 mg Intravenous Q6H PRN Rise Patience, MD      . QUEtiapine (SEROQUEL) tablet 100 mg  100 mg Oral BID Rise Patience, MD   100 mg at 11/08/15 1011  . traZODone (DESYREL) tablet 200 mg  200 mg Oral QHS Rise Patience, MD   200 mg at 11/07/15 2204    Musculoskeletal: Strength & Muscle Tone: within normal limits Gait & Station: normal Patient leans: N/A  Psychiatric Specialty Exam: ROS patient refused to answer   Blood pressure 104/54, pulse 95, temperature 97.9 F (36.6 C), temperature source Oral, resp. rate 17, height _0  (1.88 m), weight 62.2 kg (137 lb 2 oz), SpO2 99 %.Body mass index is 17.6 kg/(m^2).  General Appearance: Guarded  Eye Contact::  Good  Speech:  Clear and Coherent  Volume:  Normal  Mood:  Angry, Anxious and Irritable  Affect:  Constricted, Depressed and Labile  Thought Process:  Coherent  Orientation:  Full (Time, Place, and Person)  Thought Content:  Paranoid Ideation and Rumination  Suicidal Thoughts:  No  Homicidal Thoughts:  No  Memory:  Immediate;   Fair Recent;    Fair  Judgement:  Impaired  Insight:  Fair  Psychomotor Activity:  Increased  Concentration:  Fair  Recall:  Good  Fund of Knowledge:Good  Language: Good  Akathisia:  Negative  Handed:  Right  AIMS (if indicated):     Assets:  Agricultural consultant Resilience Social Support  ADL's:  Intact  Cognition: WNL  Sleep:      Treatment Plan Summary: Daily contact with patient to assess and evaluate symptoms and progress in treatment and Medication management  Encouraged to be compliant with medication management Patient does not required safety sitter as he has no suicidal or homicidal ideation Discontinue psychostimulant medication like Adderall due to increased paranoia and agitation until further evaluated by primary psychiatrist. Patient may be started Depakote finding milligrams twice daily and risperidone 2 mg twice daily if patient agrees with the treatment. Psychiatric consultation will sign off as patient is not interested in psychiatric services while in the hospital.  Disposition: Patient will be referred to the outpatient psych services from [facility]. Patient does not meet criteria for psychiatric inpatient admission. Supportive therapy provided about ongoing stressors.  Katelan Hirt,[name] 11/08/2015 10:25 AM

## 2015-11-08 NOTE — Progress Notes (Signed)
Inpatient Diabetes Program Recommendations  AACE/ADA: New Consensus Statement on Inpatient Glycemic Control (2015)  Target Ranges:  Prepandial:   less than 140 mg/dL      Peak postprandial:   less than 180 mg/dL (1-2 hours)      Critically ill patients:  140 - 180 mg/dL    Results for ANASTASIOS, MELANDER (MRN [id number]) as of 11/08/2015 09:26  Ref. Range 11/07/2015 07:51 11/07/2015 12:29 11/07/2015 16:36 11/07/2015 21:28  Glucose-Capillary Latest Ref Range: 65-99 mg/dL 481 (H) 856 (H) 314 (H) 269 (H)    Results for WRIGLEY, PLASENCIA (MRN [id number]) as of 11/08/2015 09:26  Ref. Range 11/08/2015 07:12  Glucose-Capillary Latest Ref Range: 65-99 mg/dL 885 (H)    Current Insulin Orders: 70/30 insulin- 40 units bidwc      Novolog Moderate SSI (0-15 units) TID AC     MD- Note patient has Psychiatric illness and has been requesting/eating lots of extra food which is making it difficult to manage his blood sugars.  Please consider increasing 70/30 insulin to 48 units bidwc (20% increase of total dose of 70/30 insulin)     --Will follow patient during hospitalization--  Ambrose Finland RN, MSN, CDE Diabetes Coordinator Inpatient Glycemic Control Team Team Pager: [phone] (8a-5p)

## 2015-11-08 NOTE — Discharge Summary (Signed)
Discharge Summary  [name] PTE:[id number] DOB: [birth year]  PCP: Jeanine Luz, FNP  Admit date: 11/04/2015 Discharge date: 11/08/2015  Time spent: <85mins  Recommendations for Outpatient Follow-up:  1. F/u with PMD in one week for hospital follow up and diabetes management 2. F/u with psychiatry  Discharge Diagnoses:  Active Hospital Problems   Diagnosis Date Noted  . Schizoaffective disorder, unspecified type ([name]) 03/29/2015  . Diabetes mellitus with nonketotic hyperosmolarity ([name]) 11/05/2015  . Hyperglycemia 12/21/2014  . Hyponatremia 06/17/2012    Resolved Hospital Problems   Diagnosis Date Noted Date Resolved  No resolved problems to display.    Discharge Condition: stable  Diet recommendation: heart healthy/carb modified  Filed Weights   11/05/15 0153 11/07/15 0517 11/08/15 0545  Weight: 122 lb 2.2 oz (55.4 kg) 135 lb 12.9 oz (61.6 kg) 137 lb 2 oz (62.2 kg)    History of present illness:  [name] is a 29 y.o. male with history of diabetes mellitus type 1, schizoaffective disorder as per the chart, tobacco abuse presents to the ER because of elevated blood sugar. Patient states he has been off his insulin for last 2 months as he has run out of his medications and has not followed up with his PCP. Patient's blood sugars found to be elevated more than 800 with tachycardia and hyponatremia. Patient is not in DKA. Denies any chest pain shortness of breath nausea vomiting diarrhea or abdominal pain or fever chills. Patient has been started on IV fluid bolus and admitted for hyperosmolar nonketotic state.  Hospital Course:  Principal Problem:   Schizoaffective disorder, unspecified type ([name]) Active Problems:   Hyponatremia   Hyperglycemia   Diabetes mellitus with nonketotic hyperosmolarity ([name])  Hyperglycemia: due to meds noncompliance, better, reported not able to afford meds, even from [employer] brand. Continue titrate insulin here, but  managing this patient 's diabetes is very chanllenging due to underline psychiatric issues/financial constrains/inconsistent diet intake.  Pseudohyponatremia: on admission due to elevated blood glucose, resolved.  Chronic left shoulder pain, should x ray no acute findings, range of motion intact on exam, outpatient follow up with pmd.  Paranoia/agitation: psychiatry consulted, recommended stop psychostimulant adderral and recommended outpatient psychiatry follow up.  Code Status: full  Family Communication: patient   Disposition Plan: home   Consultants:  psychiatry  Procedures:  none  Antibiotics:  none  Discharge Exam: BP 104/54 mmHg  Pulse 95  Temp(Src) 97.9 F (36.6 C) (Oral)  Resp 17  Ht 6\' 2"  (1.88 m)  Wt 137 lb 2 oz (62.2 kg)  BMI 17.60 kg/m2  SpO2 99%  General: aaox3, anxious, paranoid Cardiovascular: RRR Respiratory: CTABL Left shoulder: range of motion intact, able to raise above head without difficulty.  Discharge Instructions You were cared for by a hospitalist during your hospital stay. If you have any questions about your discharge medications or the care you received while you were in the hospital after you are discharged, you can call the unit and asked to speak with the hospitalist on call if the hospitalist that took care of you is not available. Once you are discharged, your primary care physician will handle any further medical issues. Please note that NO REFILLS for any discharge medications will be authorized once you are discharged, as it is imperative that you return to your primary care physician (or establish a relationship with a primary care physician if you do not have one) for your aftercare needs so that they can reassess your need for medications  and monitor your lab values.  Discharge Instructions    AMB Referral to [facility] Care Management    Complete by:  As directed   EPIC referral for [facility] services. . Consents signed. Please assign to  [facility] RNCM for DM management, [facility] LCSW for transportation, and [facility] [name] for unable to afford insulin. Goes to [facility] on [address]. Best contact number is [phone]. Please call with questions. Raiford Noble, MSN-Ed, RN,BSN-[facility] Care [facility] Liaison-[phone]  Reason for consult:  Please assign to [facility] RN CM, [facility] LCSW,  and [facility] [name]  Diagnoses of:  Diabetes  Expected date of contact:  1-3 days (reserved for hospital discharges)     Diet - low sodium heart healthy    Complete by:  As directed   Carb modified     Increase activity slowly    Complete by:  As directed             Medication List    STOP taking these medications        amphetamine-dextroamphetamine 20 MG tablet  Commonly known as:  ADDERALL      TAKE these medications        diphenhydrAMINE 25 mg capsule  Commonly known as:  BENADRYL  Take 1 capsule (25 mg total) by mouth every 8 (eight) hours as needed (anxiety).     doxepin 100 MG capsule  Commonly known as:  SINEQUAN  Take 1 capsule (100 mg total) by mouth at bedtime.     feeding supplement (GLUCERNA SHAKE) Liqd  Take 237 mLs by mouth 3 (three) times daily between meals.     freestyle lancets  TEST 3 TIMES DAILY AND WHEN NEEDED     FREESTYLE LITE test strip  Generic drug:  glucose blood  TEST BLOOD SUGARS 3 TIMES DAILY AND WHEN NEEDED     insulin NPH-regular Human (70-30) 100 UNIT/ML injection  Commonly known as:  NOVOLIN 70/30  Inject 40 Units into the skin 2 (two) times daily with a meal. [facility] has [facility] brand Insulin     Insulin Syringe-Needle U-100 29G X 1/2" 0.3 ML Misc  Commonly known as:  SAFETY-GLIDE 0.3CC SYR 29GX1/2  Use as directed.     NAPROSYN 500 MG tablet  Generic drug:  naproxen  Take 1 tablet (500 mg total) by mouth 2 (two) times daily with a meal.     omeprazole 20 MG capsule  Commonly known as:  PRILOSEC  Take 1 capsule (20 mg total) by mouth daily.     QUEtiapine 100 MG tablet  Commonly  known as:  SEROQUEL  Take 1 tablet (100 mg total) by mouth 2 (two) times daily.     traZODone 100 MG tablet  Commonly known as:  DESYREL  Take 2 tablets (200 mg total) by mouth at bedtime.       Allergies  Allergen Reactions  . Sulfa Antibiotics Other (See Comments)    Unknown - Childhood       Follow-up Information    Follow up with Jeanine Luz, FNP In 1 week.   Specialty:  Family Medicine   Why:  hospital discharge follow up   Contact information:   [address] [place] [address] [phone]        The results of significant diagnostics from this hospitalization (including imaging, microbiology, ancillary and laboratory) are listed below for reference.    Significant Diagnostic Studies: Dg Chest Port 1 View  11/05/2015  CLINICAL DATA:  Diabetes, uncontrolled  hyperglycemia. EXAM: PORTABLE CHEST 1 VIEW COMPARISON:  PA and lateral chest x-ray of January 31, 2015 FINDINGS: The lungs are well-expanded and clear. The heart and pulmonary vascularity are normal. The mediastinum is normal in width. There is no pleural effusion or pneumothorax. The bony thorax is unremarkable. IMPRESSION: Stable hyperinflation.  There is no active cardiopulmonary disease. Electronically Signed   By: David  [place] M.D.   On: 11/05/2015 07:04   Dg Shoulder Left  11/04/2015  CLINICAL DATA:  Chronic left shoulder pain for 3-6 months. Initial encounter. EXAM: LEFT SHOULDER - 2+ VIEW COMPARISON:  Chest radiograph performed 01/31/2015 FINDINGS: There is no evidence of fracture or dislocation. Mild cortical irregularity is noted along the anterior aspect of the left humeral head, possibly reflecting mild degenerative change. The left humeral head is seated within the glenoid fossa. The acromioclavicular joint is unremarkable in appearance. No significant soft tissue abnormalities are seen. The visualized portions of the left lung are clear. IMPRESSION: 1. No evidence of fracture or dislocation. 2.  Mild cortical irregularity along the anterior aspect of the left humeral head, possibly reflecting mild degenerative change. Given the chronicity of the patient's symptoms, MRI could be considered for further evaluation, as deemed clinically appropriate. Electronically Signed   By: Roanna Raider M.D.   On: 11/04/2015 22:26    Microbiology: Recent Results (from the past 240 hour(s))  MRSA PCR Screening     Status: None   Collection Time: 11/05/15  1:07 AM  Result Value Ref Range Status   MRSA by PCR NEGATIVE NEGATIVE Final    Comment:        The GeneXpert MRSA Assay (FDA approved for NASAL specimens only), is one component of a comprehensive MRSA colonization surveillance program. It is not intended to diagnose MRSA infection nor to guide or monitor treatment for MRSA infections.      Labs: Basic Metabolic Panel:  Recent Labs Lab 11/05/15 0906 11/05/15 1426 11/06/15 0855 11/07/15 0504 11/08/15 0520  NA 138 137 135 136 138  K 3.3* 3.6 3.9 4.1 3.9  CL 102 100* 99* 101 102  CO2 29 31 29 29 29   GLUCOSE 213* 97 264* 307* 218*  BUN 18 18 23* 24* 26*  CREATININE 0.54* 0.77 0.53* 0.47* 0.51*  CALCIUM 8.9 8.7* 8.7* 8.6* 9.1  MG  --  1.6* 1.6* 1.9  --    Liver Function Tests: No results for input(s): AST, ALT, ALKPHOS, BILITOT, PROT, ALBUMIN in the last 168 hours. No results for input(s): LIPASE, AMYLASE in the last 168 hours. No results for input(s): AMMONIA in the last 168 hours. CBC:  Recent Labs Lab 11/04/15 2125 11/05/15 0205 11/05/15 0515 11/06/15 0855  WBC 7.5 9.1 7.6 6.9  NEUTROABS  --   --  4.7  --   HGB 15.3 14.3 12.6* 13.2  HCT 43.4 39.6 35.7* 37.3*  MCV 87.9 86.1 86.2 87.8  PLT 330 327 276 241   Cardiac Enzymes:  Recent Labs Lab 11/05/15 0205  TROPONINI <0.03   BNP: BNP (last 3 results) No results for input(s): BNP in the last 8760 hours.  ProBNP (last 3 results) No results for input(s): PROBNP in the last 8760 hours.  CBG:  Recent  Labs Lab 11/07/15 0751 11/07/15 1229 11/07/15 1636 11/07/15 2128 11/08/15 0712  GLUCAP 327* 231* 212* 269* 233*       SignedAlbertine Grates MD, PhD  [facility] 11/08/2015, 12:29 PM

## 2015-11-08 NOTE — Care Management Important Message (Signed)
Important Message  Patient Details  Name: [name] MRN: [id number] Date of Birth: 1986/10/07   Medicare Important Message Given:  Yes    Haskell Flirt 11/08/2015, 11:47 AMImportant Message  Patient Details  Name: [name] MRN: [id number] Date of Birth: March 28, 1986   Medicare Important Message Given:  Yes    Haskell Flirt 11/08/2015, 11:47 AM

## 2015-11-09 ENCOUNTER — Other Ambulatory Visit: Payer: Self-pay

## 2015-11-09 ENCOUNTER — Other Ambulatory Visit: Payer: Self-pay | Admitting: *Deleted

## 2015-11-09 ENCOUNTER — Encounter: Payer: Self-pay | Admitting: *Deleted

## 2015-11-09 NOTE — Patient Outreach (Signed)
[facility]) Care Management  11/09/2015  JESPER STIREWALT 1986-03-19 [id number]  CSW received a new referral on patient from [facility] Liaison with [facility], indicating that patient is in need of transportation assistance to and from physician appointments. Patient was recently hospitalized at [facility] from November 17th through the 21st.  Due to patient's frequent experience with homelessness and extensive psychiatric history, it is not the expectation of management, with [facility], for CSW to provide in-home social work services and resources to patient.  CSW has worked with patient in the past, assisting patient with obtaining transportation services through [employer] [facility]).  Patient was approved for transportation assistance through [place] and has been encouraged to utilize this service to get to and from physician appointments.  No additional social work needs have been identified at this time; therefore, CSW will not be opening patient's case, nor will CSW follow along to provide case management services. CSW will perform a case closure on patient, as all goals of treatment have been met from social work standpoint and no additional social work needs have been identified at this time. CSW will converse with patient's RNCM with [facility], Thea Silversmith to report CSW's plans to close patient's case. CSW will fax a correspondence letter to patient's Primary Care Physician, Dr. Mauricio Po to ensure that Dr. Elna Breslow is aware of CSW's plans to not become involved with patient's care. CSW will submit a case closure request to Lurline Del, Care Management Assistant with [facility], in the form of an [employer].  CSW will ensure that Mrs. Laurance Flatten is aware of Collier Flowers, RNCM with [facility], continued involvement with patient's care.  Nat Christen, BSW, MSW, LCSW  Licensed Education officer, environmental Health System  Mailing [place] [address], [place], [place] [address] Physical Address-[address], [place], [place] [address] Toll Free Main # [phone] Fax # [phone] Cell # [phone]  Fax # [phone]  [email or web address]

## 2015-11-09 NOTE — Patient Outreach (Signed)
[facility]) Care Management  11/09/2015  [name] 1986/02/18 [id number]  Assessment: 29 year old with admission 11/17-11/21 with hyperglycemia. Discharge summary notes: schizoaffective disorder-paranoia while in the hospital. [facility] called for Transition of care. No answer. HIPPA compliant message left.  Plan: Follow up call tomorrow.  Kathyrn Sheriff, RN, MSN, [facility] Community Care Coordinator Cell: [phone]

## 2015-11-10 ENCOUNTER — Other Ambulatory Visit: Payer: Self-pay | Admitting: *Deleted

## 2015-11-10 ENCOUNTER — Ambulatory Visit: Payer: Self-pay

## 2015-11-10 NOTE — Patient Outreach (Signed)
Call placed to member (for assigned care manager, J. Earlene Plater), no answer, HIPPA compliant voice message left.  Will await call back.  Will inform Mrs. Earlene Plater of unsuccessful attempt to contact member upon her return.  Kemper Durie, BSN, [facility] Care Management  [facility] Care Manager [phone]

## 2015-11-12 ENCOUNTER — Other Ambulatory Visit: Payer: Self-pay

## 2015-11-12 NOTE — Patient Outreach (Signed)
Unsuccessful attempts made to contact patient via telephone at the following numbers: To patient at [phone] or number listed as his mother's [phone]. HIPPA compliant messages left at both numbers to contact me today or primary case manager, Kathyrn Sheriff, at [phone].

## 2015-11-15 ENCOUNTER — Other Ambulatory Visit: Payer: Self-pay

## 2015-11-15 NOTE — Patient Outreach (Signed)
[facility]) Care Management  11/15/2015  [name] September 11, 1986 [id number]  Assessment-Referral received for transition of care post hospitalization. Three unsuccessful calls made Care Coordinators and one unsuccessful call made by pharmacist.   Plan: Per policy and procedure RNCM will send outreach letter.   Kathyrn Sheriff, RN, MSN, [facility] Community Care Coordinator Cell: [phone]

## 2015-11-15 NOTE — Patient Outreach (Signed)
I called Mr. Care and had to leave a HIPPA compliant message for him to return my call.  I will reach out to him again later this week.  This is attempt number one.  [name], [name], [employer] [facility] Environmental consultant [phone]

## 2015-11-19 ENCOUNTER — Other Ambulatory Visit: Payer: Self-pay | Admitting: Pharmacist

## 2015-11-19 NOTE — Patient Outreach (Signed)
[facility]) Care Management  [facility]   11/19/2015  [name] 28-Sep-1986 [id number]  I called the patient to discuss his medications and I had to leave a HIAPA compliant message for patient to return my phone call.  I will reach out on 11/22/15 if the patient does not return my call today. Of note, this is the 2nd attempt at outreach from the pharmacy team.   Juanita Craver, [name], [facility] Clinical Pharmacist [facility] [phone]

## 2015-11-22 ENCOUNTER — Ambulatory Visit: Payer: PPO | Admitting: Family

## 2015-11-22 ENCOUNTER — Other Ambulatory Visit: Payer: Self-pay | Admitting: Pharmacist

## 2015-11-22 DIAGNOSIS — Z0289 Encounter for other administrative examinations: Secondary | ICD-10-CM

## 2015-11-22 NOTE — Patient Outreach (Signed)
[facility]) Care Management  [facility]   11/22/2015  [name] 1986-06-03 [id number]  I called the patient to discuss his medications and I had to leave a HIPAA compliant message for patient to return my phone call.  I will send a letter to the patient and if I do not receive a response in 14 days, will close the case.    Juanita Craver, [name], BCPS Clinical Pharmacist [facility] [phone]

## 2015-11-24 ENCOUNTER — Other Ambulatory Visit: Payer: Self-pay

## 2015-11-24 NOTE — Patient Outreach (Signed)
[facility]) Care Management  11/24/2015  [name] Mar 01, 1986 [id number]  Assessment: RNCM has been unable to reach member after multiple attempts. RNCM called regarding transition of care. Received member's voice mail. RNCM left HIPPA compliant voice message followed by HIPPA compliant text.  Plan: await response.  Kathyrn Sheriff, RN, MSN, [facility] Community Care Coordinator Cell: [phone]

## 2015-11-25 ENCOUNTER — Encounter (HOSPITAL_COMMUNITY): Payer: Self-pay | Admitting: Emergency Medicine

## 2015-11-25 ENCOUNTER — Inpatient Hospital Stay (HOSPITAL_COMMUNITY)
Admission: EM | Admit: 2015-11-25 | Discharge: 2015-11-26 | DRG: 885 | Disposition: A | Discharge status: Home / Self Care | Payer: PPO | Attending: Internal Medicine | Admitting: Internal Medicine

## 2015-11-25 DIAGNOSIS — E1065 Type 1 diabetes mellitus with hyperglycemia: Secondary | ICD-10-CM

## 2015-11-25 DIAGNOSIS — IMO0002 Reserved for concepts with insufficient information to code with codable children: Secondary | ICD-10-CM | POA: Diagnosis present

## 2015-11-25 DIAGNOSIS — M13852 Other specified arthritis, left hip: Secondary | ICD-10-CM | POA: Diagnosis present

## 2015-11-25 DIAGNOSIS — K219 Gastro-esophageal reflux disease without esophagitis: Secondary | ICD-10-CM | POA: Diagnosis present

## 2015-11-25 DIAGNOSIS — F329 Major depressive disorder, single episode, unspecified: Secondary | ICD-10-CM | POA: Diagnosis present

## 2015-11-25 DIAGNOSIS — I10 Essential (primary) hypertension: Secondary | ICD-10-CM | POA: Diagnosis present

## 2015-11-25 DIAGNOSIS — Z79899 Other long term (current) drug therapy: Secondary | ICD-10-CM | POA: Diagnosis not present

## 2015-11-25 DIAGNOSIS — F1210 Cannabis abuse, uncomplicated: Secondary | ICD-10-CM | POA: Diagnosis present

## 2015-11-25 DIAGNOSIS — F259 Schizoaffective disorder, unspecified: Secondary | ICD-10-CM | POA: Diagnosis not present

## 2015-11-25 DIAGNOSIS — F17210 Nicotine dependence, cigarettes, uncomplicated: Secondary | ICD-10-CM | POA: Diagnosis present

## 2015-11-25 DIAGNOSIS — E108 Type 1 diabetes mellitus with unspecified complications: Secondary | ICD-10-CM

## 2015-11-25 DIAGNOSIS — Z9114 Patient's other noncompliance with medication regimen: Secondary | ICD-10-CM

## 2015-11-25 DIAGNOSIS — Z781 Physical restraint status: Secondary | ICD-10-CM

## 2015-11-25 DIAGNOSIS — E860 Dehydration: Secondary | ICD-10-CM | POA: Diagnosis not present

## 2015-11-25 DIAGNOSIS — G8929 Other chronic pain: Secondary | ICD-10-CM | POA: Diagnosis present

## 2015-11-25 DIAGNOSIS — R739 Hyperglycemia, unspecified: Secondary | ICD-10-CM

## 2015-11-25 DIAGNOSIS — F250 Schizoaffective disorder, bipolar type: Principal | ICD-10-CM

## 2015-11-25 DIAGNOSIS — Z794 Long term (current) use of insulin: Secondary | ICD-10-CM | POA: Diagnosis not present

## 2015-11-25 DIAGNOSIS — F309 Manic episode, unspecified: Secondary | ICD-10-CM

## 2015-11-25 DIAGNOSIS — M13851 Other specified arthritis, right hip: Secondary | ICD-10-CM | POA: Diagnosis present

## 2015-11-25 LAB — CBC
HCT: 41.7 % (ref 39.0–52.0)
Hemoglobin: 14.5 g/dL (ref 13.0–17.0)
MCH: 31.1 pg (ref 26.0–34.0)
MCHC: 34.8 g/dL (ref 30.0–36.0)
MCV: 89.5 fL (ref 78.0–100.0)
PLATELETS: 223 10*3/uL (ref 150–400)
RBC: 4.66 MIL/uL (ref 4.22–5.81)
RDW: 12.3 % (ref 11.5–15.5)
WBC: 7 10*3/uL (ref 4.0–10.5)

## 2015-11-25 LAB — URINALYSIS, ROUTINE W REFLEX MICROSCOPIC
Bilirubin Urine: NEGATIVE
Glucose, UA: >1000 mg/dL — AB
Hgb urine dipstick: NEGATIVE
KETONES UR: 15 mg/dL — AB
LEUKOCYTES UA: NEGATIVE
Nitrite: NEGATIVE
PROTEIN: NEGATIVE mg/dL
Specific Gravity, Urine: 1.039 — ABNORMAL HIGH (ref 1.005–1.030)
pH: 6.5 (ref 5.0–8.0)

## 2015-11-25 LAB — CBG MONITORING, ED
GLUCOSE-CAPILLARY: 410 mg/dL — AB (ref 65–99)
GLUCOSE-CAPILLARY: 172 mg/dL — AB (ref 65–99)

## 2015-11-25 LAB — BLOOD GAS, VENOUS
Acid-Base Excess: 3.4 mmol/L — ABNORMAL HIGH (ref 0.0–2.0)
Bicarbonate: 29.5 mEq/L — ABNORMAL HIGH (ref 20.0–24.0)
O2 SAT: 42.9 %
PATIENT TEMPERATURE: 98.6
TCO2: 26.6 mmol/L (ref 0–100)
pCO2, Ven: 53.2 mmHg — ABNORMAL HIGH (ref 45.0–50.0)
pH, Ven: 7.363 — ABNORMAL HIGH (ref 7.250–7.300)
pO2, Ven: 21.8 mmHg — CL (ref 30.0–45.0)

## 2015-11-25 LAB — RAPID URINE DRUG SCREEN, HOSP PERFORMED
Amphetamines: NOT DETECTED
BARBITURATES: NOT DETECTED
BENZODIAZEPINES: NOT DETECTED
Cocaine: NOT DETECTED
Opiates: NOT DETECTED
TETRAHYDROCANNABINOL: POSITIVE — AB

## 2015-11-25 LAB — BASIC METABOLIC PANEL
Anion gap: 12 (ref 5–15)
BUN: 14 mg/dL (ref 6–20)
CALCIUM: 9.1 mg/dL (ref 8.9–10.3)
CO2: 29 mmol/L (ref 22–32)
CREATININE: 0.83 mg/dL (ref 0.61–1.24)
Chloride: 93 mmol/L — ABNORMAL LOW (ref 101–111)
Glucose, Bld: 688 mg/dL (ref 65–99)
Potassium: 4.1 mmol/L (ref 3.5–5.1)
SODIUM: 134 mmol/L — AB (ref 135–145)

## 2015-11-25 LAB — URINE MICROSCOPIC-ADD ON
Bacteria, UA: NONE SEEN
RBC / HPF: NONE SEEN RBC/hpf (ref 0–5)
WBC UA: NONE SEEN WBC/hpf (ref 0–5)

## 2015-11-25 LAB — GLUCOSE, CAPILLARY: Glucose-Capillary: 159 mg/dL — ABNORMAL HIGH (ref 65–99)

## 2015-11-25 LAB — I-STAT CG4 LACTIC ACID, ED: LACTIC ACID, VENOUS: 1.13 mmol/L (ref 0.5–2.0)

## 2015-11-25 MED ORDER — DIPHENHYDRAMINE HCL 25 MG PO CAPS: 25.0000 mg | ORAL_CAPSULE | Freq: Three times a day (TID) | ORAL | Status: DC | PRN

## 2015-11-25 MED ORDER — INSULIN ASPART 100 UNIT/ML ~~LOC~~ SOLN
0.0000 [IU] | Freq: Three times a day (TID) | SUBCUTANEOUS | Status: DC
  Administered 2015-11-26: 11 [IU] via SUBCUTANEOUS
  Administered 2015-11-26: 3 [IU] via SUBCUTANEOUS

## 2015-11-25 MED ORDER — INSULIN ASPART 100 UNIT/ML ~~LOC~~ SOLN
10.0000 [IU] | Freq: Once | SUBCUTANEOUS | Status: AC
  Administered 2015-11-25: 10 [IU] via SUBCUTANEOUS
  Filled 2015-11-25: qty 1

## 2015-11-25 MED ORDER — SODIUM CHLORIDE 0.9 % IV BOLUS (SEPSIS)
1000.0000 mL | Freq: Once | INTRAVENOUS | Status: AC
  Administered 2015-11-25: 1000 mL via INTRAVENOUS

## 2015-11-25 MED ORDER — ACETAMINOPHEN 650 MG RE SUPP: 650.0000 mg | Freq: Four times a day (QID) | RECTAL | Status: DC | PRN

## 2015-11-25 MED ORDER — PANTOPRAZOLE SODIUM 40 MG PO TBEC
40.0000 mg | DELAYED_RELEASE_TABLET | Freq: Every day | ORAL | Status: DC
Start: 2015-11-25 — End: 2015-11-26
  Administered 2015-11-26: 40 mg via ORAL
  Filled 2015-11-25 (×2): qty 1

## 2015-11-25 MED ORDER — INSULIN ASPART PROT & ASPART (70-30 MIX) 100 UNIT/ML ~~LOC~~ SUSP
40.0000 [IU] | Freq: Two times a day (BID) | SUBCUTANEOUS | Status: DC
  Administered 2015-11-26: 40 [IU] via SUBCUTANEOUS
  Filled 2015-11-25 (×2): qty 10

## 2015-11-25 MED ORDER — TRAZODONE HCL 100 MG PO TABS
200.0000 mg | ORAL_TABLET | Freq: Every day | ORAL | Status: DC
  Administered 2015-11-25: 200 mg via ORAL
  Filled 2015-11-25 (×2): qty 2

## 2015-11-25 MED ORDER — SODIUM CHLORIDE 0.9 % IV SOLN
INTRAVENOUS | Status: DC
  Administered 2015-11-25: 22:00:00 via INTRAVENOUS

## 2015-11-25 MED ORDER — ONDANSETRON HCL 4 MG/2ML IJ SOLN: 4.0000 mg | Freq: Four times a day (QID) | INTRAMUSCULAR | Status: DC | PRN

## 2015-11-25 MED ORDER — ONDANSETRON HCL 4 MG PO TABS: 4.0000 mg | ORAL_TABLET | Freq: Four times a day (QID) | ORAL | Status: DC | PRN

## 2015-11-25 MED ORDER — INSULIN ASPART 100 UNIT/ML ~~LOC~~ SOLN: 0.0000 [IU] | Freq: Three times a day (TID) | SUBCUTANEOUS | Status: DC

## 2015-11-25 MED ORDER — DOXEPIN HCL 100 MG PO CAPS
100.0000 mg | ORAL_CAPSULE | Freq: Every day | ORAL | Status: DC
  Administered 2015-11-25: 100 mg via ORAL
  Filled 2015-11-25 (×2): qty 1

## 2015-11-25 MED ORDER — LORAZEPAM 2 MG/ML IJ SOLN
2.0000 mg | Freq: Once | INTRAMUSCULAR | Status: AC
  Administered 2015-11-25: 2 mg via INTRAVENOUS
  Filled 2015-11-25: qty 1

## 2015-11-25 MED ORDER — ACETAMINOPHEN 325 MG PO TABS
650.0000 mg | ORAL_TABLET | Freq: Four times a day (QID) | ORAL | Status: DC | PRN
Start: 2015-11-25 — End: 2015-11-26

## 2015-11-25 MED ORDER — ENOXAPARIN SODIUM 40 MG/0.4ML ~~LOC~~ SOLN
40.0000 mg | SUBCUTANEOUS | Status: DC
  Administered 2015-11-25: 40 mg via SUBCUTANEOUS
  Filled 2015-11-25 (×2): qty 0.4

## 2015-11-25 MED ORDER — HALOPERIDOL LACTATE 5 MG/ML IJ SOLN
2.0000 mg | Freq: Four times a day (QID) | INTRAMUSCULAR | Status: DC | PRN
  Administered 2015-11-26 (×2): 2 mg via INTRAVENOUS
  Filled 2015-11-25 (×2): qty 1

## 2015-11-25 MED ORDER — QUETIAPINE FUMARATE 100 MG PO TABS
100.0000 mg | ORAL_TABLET | Freq: Two times a day (BID) | ORAL | Status: DC
  Administered 2015-11-25 – 2015-11-26 (×2): 100 mg via ORAL
  Filled 2015-11-25 (×3): qty 1

## 2015-11-25 MED ORDER — INSULIN ASPART 100 UNIT/ML ~~LOC~~ SOLN: 0.0000 [IU] | Freq: Every day | SUBCUTANEOUS | Status: DC

## 2015-11-25 MED ORDER — HALOPERIDOL LACTATE 5 MG/ML IJ SOLN
5.0000 mg | Freq: Once | INTRAMUSCULAR | Status: AC
  Administered 2015-11-25: 5 mg via INTRAVENOUS
  Filled 2015-11-25: qty 1

## 2015-11-25 NOTE — H&P (Signed)
[facility] History and Physical  Patient: [name]  MRN: [id number]  DOB: 01-27-1986  DOS: the patient was seen and examined on 11/25/2015 PCP: Jeanine Luz, FNP  Referring physician: Dr Jonn Shingles Chief Complaint: hyperglycemia  HPI: [name] is a 29 y.o. male with Past medical history of type 1 diabetes mellitus, schizoaffective disorder. Pt presented with complain of high blood sugar, history was limited due to patient seen after receiving haldol. Pt reportedly had ran out of his insulin and felt that he likely need to go to the hospital. He called EMS and EMS found the patient was not acting himself and was agitated and confused. Patient has not been taking any of his medication for at least a week. In the ER the patient was having flight of ideas and agitation and was yelling and cursing the staff member requiring physical restraints as well as Haldol. Patient was also assisted with security. After receiving the Haldol at the time of my evaluation the patient was calm and comfortable and was cooperative with the examination and history. He denied any complaints of chest pain and abdominal pain nausea vomiting, cough, fever, chills, diarrhea, constipation, burning urination at home. No fall no trauma no injury reported by patient as well.  The patient is coming from home.  At his baseline ambulates without support And is independent for most of his ADL; manages his medication on his own.  Review of Systems: as mentioned in the history of present illness.  A comprehensive review of the other systems is negative.  Past Medical History  Diagnosis Date  . Depression   . Schizo affective schizophrenia ([name])   . Polysubstance abuse   . Scoliosis   . Chronic pain   . Noncompliance with medication regimen   . GERD (gastroesophageal reflux disease)   . Arthritis     "both hips" (12/21/2014)  . Asthma     Childhood  . Chicken pox   . Diabetes mellitus     Type  1 diabetes  . DKA (diabetic ketoacidoses) ([name]) 11/18/2014  . Hypertension    Past Surgical History  Procedure Laterality Date  . Hand surgery Left     Left thumb surgery   Social History:  reports that he has been smoking Cigarettes.  He has a 7 pack-year smoking history. He has never used smokeless tobacco. He reports that he does not drink alcohol or use illicit drugs.  Allergies  Allergen Reactions  . Sulfa Antibiotics Other (See Comments)    Unknown - Childhood    Family History  Problem Relation Age of Onset  . Diabetes Mother     Prior to Admission medications   Medication Sig Start Date End Date Taking? Authorizing Provider  amphetamine-dextroamphetamine (ADDERALL) 20 MG tablet Take 40 mg by mouth 2 (two) times daily.   Yes Historical Provider, MD  traZODone (DESYREL) 100 MG tablet Take 2 tablets (200 mg total) by mouth at bedtime. 10/27/15  Yes Veryl Speak, FNP  diphenhydrAMINE (BENADRYL) 25 mg capsule Take 1 capsule (25 mg total) by mouth every 8 (eight) hours as needed (anxiety). Patient not taking: Reported on 11/25/2015 11/08/15   Albertine Grates, MD  doxepin (SINEQUAN) 100 MG capsule Take 1 capsule (100 mg total) by mouth at bedtime. Patient not taking: Reported on 11/25/2015 08/19/15   Veryl Speak, FNP  feeding supplement, GLUCERNA SHAKE, (GLUCERNA SHAKE) LIQD Take 237 mLs by mouth 3 (three) times daily between meals. Patient not taking: Reported on 11/25/2015  11/08/15   Albertine Grates, MD  insulin NPH-regular Human (NOVOLIN 70/30) (70-30) 100 UNIT/ML injection Inject 40 Units into the skin 2 (two) times daily with a meal. [employer] has [employer] brand Insulin Patient not taking: Reported on 11/04/2015 06/03/15   Elease Etienne, MD  Insulin Syringe-Needle U-100 (SAFETY-GLIDE 0.3CC SYR 29GX1/2) 29G X 1/2" 0.3 ML MISC Use as directed. Patient not taking: Reported on 11/25/2015 06/03/15   Elease Etienne, MD  Lancets (FREESTYLE) lancets TEST 3 TIMES DAILY AND WHEN NEEDED 03/29/15    Historical Provider, MD  NAPROSYN 500 MG tablet Take 1 tablet (500 mg total) by mouth 2 (two) times daily with a meal. Patient not taking: Reported on 11/04/2015 07/22/15   Veryl Speak, FNP  omeprazole (PRILOSEC) 20 MG capsule Take 1 capsule (20 mg total) by mouth daily. Patient not taking: Reported on 11/04/2015 08/19/15   Veryl Speak, FNP  QUEtiapine (SEROQUEL) 100 MG tablet Take 1 tablet (100 mg total) by mouth 2 (two) times daily. Patient not taking: Reported on 11/25/2015 08/19/15   Veryl Speak, FNP    Physical Exam: Filed Vitals:   11/25/15 1730 11/25/15 1745 11/25/15 1800 11/25/15 1830  BP: 105/70  99/74 111/74  Pulse:  101 98 96  Temp:      TempSrc:      Resp:    16  SpO2:  97% 97% 96%    General: Alert, Awake and Oriented to Time, Place and Person. Appear in mild distress Eyes: PERRL ENT: Oral Mucosa clear dry. Neck: no JVD Cardiovascular: S1 and S2 Present, no Murmur, Peripheral Pulses Present Respiratory: Bilateral Air entry equal and Decreased,  Clear to Auscultation, no Crackles, no wheezes Abdomen: Bowel Sound present, Soft and no tenderness Skin: no Rash Extremities: no Pedal edema, no calf tenderness Neurologic: Grossly no focal neuro deficit  Labs on Admission:  CBC:  Recent Labs Lab 11/25/15 1409  WBC 7.0  HGB 14.5  HCT 41.7  MCV 89.5  PLT 223    CMP     Component Value Date/Time   NA 134* 11/25/2015 1409   K 4.1 11/25/2015 1409   CL 93* 11/25/2015 1409   CO2 29 11/25/2015 1409   GLUCOSE 688* 11/25/2015 1409   BUN 14 11/25/2015 1409   CREATININE 0.83 11/25/2015 1409   CALCIUM 9.1 11/25/2015 1409   PROT 7.4 05/13/2015 1225   ALBUMIN 4.5 05/13/2015 1225   AST 21 05/13/2015 1225   ALT 16* 05/13/2015 1225   ALKPHOS 87 05/13/2015 1225   BILITOT 0.9 05/13/2015 1225   GFRNONAA >60 11/25/2015 1409   GFRAA >60 11/25/2015 1409   Assessment/Plan 1. Schizoaffective disorder ([name]) Patient presents with hyperglycemia and was found to have  agitation, confusion, hallucination or flight of ideas. Throughout his course in ED the patient was requiring restraint as well as IV Haldol. Patient was also seen by security. Patient has been IVC in the ER. Patient was seen by TTS and due to his hyperglycemia and he recommended the patient to be admitted in the hospital for observation to have 24-hour period of normal blood sugars prior to transitioning him to behavioral health. Use when necessary Haldol for agitation.  2. Type 1 diabetes mellitus. Uncontrolled. Hyperglycemia. Blood sugars are well controlled. We'll continue his home insulin and placing him on sliding scale insulin. IV fluids.   Nutrition: Diabetic diet DVT Prophylaxis: subcutaneous Heparin  Advance goals of care discussion: Full code psychiatric   Consults: psychiatry   Disposition: Admitted as  observation, med-surge unit.  Author: Lynden Oxford, MD [facility] Pager: [phone] 11/25/2015  If 7PM-7AM, please contact night-coverage [email or web address] Password [email or web address]

## 2015-11-25 NOTE — Telephone Encounter (Signed)
Noted! Thank you

## 2015-11-25 NOTE — ED Notes (Signed)
Patient had oxygen turned on and was demanding food. Stated we would turn the oxygen off if he got some food.

## 2015-11-25 NOTE — ED Notes (Signed)
Bed: KG25 Expected date:  Expected time:  Means of arrival:  Comments: Ems-hyperglycemia, hallucinations

## 2015-11-25 NOTE — ED Notes (Signed)
Pt asking for [place] sandwich and diet coke. Already gave pt one diet coke. Do not want to give pt another due to elevated HR. Explained to pt that he is being seen for hyperglycemia, and cannot have carbs until cleared by a provider. Offered warm blankets and water, but pt refused.

## 2015-11-25 NOTE — ED Notes (Signed)
Pt screaming for sandwich. Pt agreed to take insulin. Gave pt a sandwich and diet coke. Pt intermittently swearing at staff and then saying staff should get a xbox for Christmas.

## 2015-11-25 NOTE — ED Provider Notes (Signed)
CSN: [id number]     Arrival date & time 11/25/15  1327 History   First MD Initiated Contact with Patient 11/25/15 1502     Chief Complaint  Patient presents with  . Hyperglycemia  . IVC      (Consider location/radiation/quality/duration/timing/severity/associated sxs/prior Treatment) Patient is a 29 y.o. male presenting with hyperglycemia and general illness. The history is provided by the patient.  Hyperglycemia Associated symptoms: no abdominal pain, no chest pain, no confusion, no fever, no shortness of breath and no vomiting   Illness Severity:  Moderate Onset quality:  Sudden Duration:  2 days Timing:  Constant Progression:  Worsening Chronicity:  New Associated symptoms: no abdominal pain, no chest pain, no congestion, no diarrhea, no fever, no headaches, no myalgias, no rash, no shortness of breath and no vomiting     29 yo M with a chief complaint of hyperglycemia. Patient states this been going on for a couple days. Patient also has had difficulties as he hasnt enough money to pay for anything and feels like he needs to stay in the hospital. Patient has a history of schizoaffective disorder  States he's been without his medicines for at least a week. Difficult to obtain history as patient is having flight of ideas and delusions.  Past Medical History  Diagnosis Date  . Depression   . Schizo affective schizophrenia ([name])   . Polysubstance abuse   . Scoliosis   . Chronic pain   . Noncompliance with medication regimen   . GERD (gastroesophageal reflux disease)   . Arthritis     "both hips" (12/21/2014)  . Asthma     Childhood  . Chicken pox   . Diabetes mellitus     Type 1 diabetes  . DKA (diabetic ketoacidoses) ([name]) 11/18/2014  . Hypertension    Past Surgical History  Procedure Laterality Date  . Hand surgery Left     Left thumb surgery   Family History  Problem Relation Age of Onset  . Diabetes Mother    Social History  Substance Use Topics  . Smoking  status: Current Some Day Smoker -- 1.00 packs/day for 7 years    Types: Cigarettes  . Smokeless tobacco: Never Used  . Alcohol Use: No     Comment: 12/21/2014 "I drink on a rare occasion"    Review of Systems  Constitutional: Negative for fever and chills.  HENT: Negative for congestion and facial swelling.   Eyes: Negative for discharge and visual disturbance.  Respiratory: Negative for shortness of breath.   Cardiovascular: Negative for chest pain and palpitations.  Gastrointestinal: Negative for vomiting, abdominal pain and diarrhea.  Musculoskeletal: Positive for arthralgias (left shoulder). Negative for myalgias.  Skin: Negative for color change and rash.  Neurological: Negative for tremors, syncope and headaches.  Psychiatric/Behavioral: Negative for confusion and dysphoric mood.      Allergies  Sulfa antibiotics  Home Medications   Prior to Admission medications   Medication Sig Start Date End Date Taking? Authorizing Provider  amphetamine-dextroamphetamine (ADDERALL) 20 MG tablet Take 40 mg by mouth 2 (two) times daily.   Yes Historical Provider, MD  traZODone (DESYREL) 100 MG tablet Take 2 tablets (200 mg total) by mouth at bedtime. 10/27/15  Yes Veryl Speak, FNP  diphenhydrAMINE (BENADRYL) 25 mg capsule Take 1 capsule (25 mg total) by mouth every 8 (eight) hours as needed (anxiety). Patient not taking: Reported on 11/25/2015 11/08/15   Albertine Grates, MD  doxepin (SINEQUAN) 100 MG capsule Take 1 capsule (  100 mg total) by mouth at bedtime. Patient not taking: Reported on 11/25/2015 08/19/15   Veryl Speak, FNP  feeding supplement, GLUCERNA SHAKE, (GLUCERNA SHAKE) LIQD Take 237 mLs by mouth 3 (three) times daily between meals. Patient not taking: Reported on 11/25/2015 11/08/15   Albertine Grates, MD  insulin NPH-regular Human (NOVOLIN 70/30) (70-30) 100 UNIT/ML injection Inject 40 Units into the skin 2 (two) times daily with a meal. [employer] has [employer] brand Insulin Patient not  taking: Reported on 11/04/2015 06/03/15   Elease Etienne, MD  Insulin Syringe-Needle U-100 (SAFETY-GLIDE 0.3CC SYR 29GX1/2) 29G X 1/2" 0.3 ML MISC Use as directed. Patient not taking: Reported on 11/25/2015 06/03/15   Elease Etienne, MD  Lancets (FREESTYLE) lancets TEST 3 TIMES DAILY AND WHEN NEEDED 03/29/15   Historical Provider, MD  NAPROSYN 500 MG tablet Take 1 tablet (500 mg total) by mouth 2 (two) times daily with a meal. Patient not taking: Reported on 11/04/2015 07/22/15   Veryl Speak, FNP  omeprazole (PRILOSEC) 20 MG capsule Take 1 capsule (20 mg total) by mouth daily. Patient not taking: Reported on 11/04/2015 08/19/15   Veryl Speak, FNP  QUEtiapine (SEROQUEL) 100 MG tablet Take 1 tablet (100 mg total) by mouth 2 (two) times daily. Patient not taking: Reported on 11/25/2015 08/19/15   Veryl Speak, FNP   BP 111/74 mmHg  Pulse 96  Temp(Src) 98.2 F (36.8 C) (Oral)  Resp 16  SpO2 96% Physical Exam  Constitutional: He is oriented to person, place, and time. He appears well-developed and well-nourished.  HENT:  Head: Normocephalic and atraumatic.  Eyes: EOM are normal. Pupils are equal, round, and reactive to light.  Neck: Normal range of motion. Neck supple. No JVD present.  Cardiovascular: Normal rate and regular rhythm.  Exam reveals no gallop and no friction rub.   No murmur heard. Pulmonary/Chest: No respiratory distress. He has no wheezes.  Abdominal: He exhibits no distension. There is no rebound and no guarding.  Musculoskeletal: Normal range of motion.  Open wounds noted to his third fourth and fifth PIP joint. Tender with forward flexion of his left shoulder up above 90.  Neurological: He is alert and oriented to person, place, and time.  Skin: No rash noted. No pallor.  Psychiatric: His behavior is normal. His mood appears anxious. His affect is blunt. His speech is rapid and/or pressured and tangential. Thought content is delusional.  Nursing note and vitals  reviewed.   ED Course  Procedures (including critical care time) Labs Review Labs Reviewed  BASIC METABOLIC PANEL - Abnormal; Notable for the following:    Sodium 134 (*)    Chloride 93 (*)    Glucose, Bld 688 (*)    All other components within normal limits  URINALYSIS, ROUTINE W REFLEX MICROSCOPIC (NOT AT [facility]) - Abnormal; Notable for the following:    Specific Gravity, Urine 1.039 (*)    Glucose, UA >1000 (*)    Ketones, ur 15 (*)    All other components within normal limits  BLOOD GAS, VENOUS - Abnormal; Notable for the following:    pH, Ven 7.363 (*)    pCO2, Ven 53.2 (*)    pO2, Ven 21.8 (*)    Bicarbonate 29.5 (*)    Acid-Base Excess 3.4 (*)    All other components within normal limits  URINE MICROSCOPIC-ADD ON - Abnormal; Notable for the following:    Squamous Epithelial / LPF 0-5 (*)    All other components  within normal limits  URINE RAPID DRUG SCREEN, HOSP PERFORMED - Abnormal; Notable for the following:    Tetrahydrocannabinol POSITIVE (*)    All other components within normal limits  CBG MONITORING, ED - Abnormal; Notable for the following:    Glucose-Capillary 410 (*)    All other components within normal limits  CBG MONITORING, ED - Abnormal; Notable for the following:    Glucose-Capillary 172 (*)    All other components within normal limits  CBC  COMPREHENSIVE METABOLIC PANEL  CBC  PROTIME-INR  I-STAT CG4 LACTIC ACID, ED    Imaging Review No results found. I have personally reviewed and evaluated these images and lab results as part of my medical decision-making.   EKG Interpretation   Date/Time:  Thursday November 25 2015 15:05:48 EST Ventricular Rate:  101 PR Interval:  147 QRS Duration: 80 QT Interval:  348 QTC Calculation: 451 R Axis:   -37 Text Interpretation:  Sinus tachycardia Baseline wander No significant  change since last tracing Confirmed by Aldon Hengst MD, Reuel Boom [phone]) on  11/25/2015 3:52:36 PM      MDM   Final diagnoses:   Hyperglycemia  Schizoaffective disorder, bipolar type ([name])  Mania ([name])    29 yo M seems to have decompensation of his underlying psychiatric disease.Currently manic on my exam. Is not in DKA. Will give the patient 2 L of fluids.  10 units of normal insulin IV. TTS eval.   TTS feels that with his hyperglycemia they will be unable to take him until his blood sugar is less than 350 for 24 hours. Patient continues to be actively manic given Haldol and Ativan will admit.  The patients results and plan were reviewed and discussed.   Any x-rays performed were independently reviewed by myself.   Differential diagnosis were considered with the presenting HPI.  Medications  insulin aspart (novoLOG) injection 0-5 Units (not administered)  insulin aspart protamine- aspart (NOVOLOG MIX 70/30) injection 40 Units (0 Units Subcutaneous Hold 11/25/15 1831)  pantoprazole (PROTONIX) EC tablet 40 mg (not administered)  QUEtiapine (SEROQUEL) tablet 100 mg (not administered)  doxepin (SINEQUAN) capsule 100 mg (not administered)  diphenhydrAMINE (BENADRYL) capsule 25 mg (not administered)  traZODone (DESYREL) tablet 200 mg (not administered)  enoxaparin (LOVENOX) injection 40 mg (not administered)  0.9 %  sodium chloride infusion (not administered)  acetaminophen (TYLENOL) tablet 650 mg (not administered)    Or  acetaminophen (TYLENOL) suppository 650 mg (not administered)  ondansetron (ZOFRAN) tablet 4 mg (not administered)    Or  ondansetron (ZOFRAN) injection 4 mg (not administered)  insulin aspart (novoLOG) injection 0-15 Units (not administered)  haloperidol lactate (HALDOL) injection 2 mg (not administered)  sodium chloride 0.9 % bolus 1,000 mL (0 mLs Intravenous Stopped 11/25/15 1649)  sodium chloride 0.9 % bolus 1,000 mL (0 mLs Intravenous Stopped 11/25/15 1649)  insulin aspart (novoLOG) injection 10 Units (10 Units Subcutaneous Given 11/25/15 1551)  haloperidol lactate (HALDOL) injection 5 mg (5  mg Intravenous Given 11/25/15 1706)  LORazepam (ATIVAN) injection 2 mg (2 mg Intravenous Given 11/25/15 1706)    Filed Vitals:   11/25/15 1730 11/25/15 1745 11/25/15 1800 11/25/15 1830  BP: 105/70  99/74 111/74  Pulse:  101 98 96  Temp:      TempSrc:      Resp:    16  SpO2:  97% 97% 96%    Final diagnoses:  Hyperglycemia  Schizoaffective disorder, bipolar type ([name])  Mania ([name])    Admission/ observation were discussed with  the admitting physician, patient and/or family and they are comfortable with the plan.    Melene Plan, DO 11/25/15 [phone]

## 2015-11-25 NOTE — ED Notes (Signed)
MD at bedside. 

## 2015-11-25 NOTE — ED Notes (Signed)
Pt has been screaming for hours "give me my food" and "[place]". Screaming so loud you can hear him yelling from other patients rooms. Pt is being IVC. Pt has also called risk management. Pt will be placed in restrains and given medications. Pt will be released from restraints when he calms down.
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
## 2015-11-25 NOTE — ED Notes (Signed)
Pt resting. Removed wrist restraints. Pt coffee at bedside per pt request.

## 2015-11-25 NOTE — ED Notes (Addendum)
Pt keeps screaming "where's my food." Explained that dinner trays come at 1800, but pt does not seem to acknowledge staff information and keeps yelling. Pt has already had a [place] sandwich and 2 diet cokes. Spoke with MD for orders.

## 2015-11-25 NOTE — ED Notes (Signed)
Per EMS. Pt reports he ran out of his home insulin a few days ago. CBG 548. Pt also has a hx of schizophrenia and has been acting paranoid during transport.

## 2015-11-25 NOTE — ED Notes (Signed)
Charge RN at bedside. 

## 2015-11-26 DIAGNOSIS — F259 Schizoaffective disorder, unspecified: Secondary | ICD-10-CM

## 2015-11-26 LAB — GLUCOSE, CAPILLARY
GLUCOSE-CAPILLARY: 309 mg/dL — AB (ref 65–99)
GLUCOSE-CAPILLARY: 177 mg/dL — AB (ref 65–99)
Glucose-Capillary: 295 mg/dL — ABNORMAL HIGH (ref 65–99)

## 2015-11-26 LAB — COMPREHENSIVE METABOLIC PANEL
ALBUMIN: 3.3 g/dL — AB (ref 3.5–5.0)
ALK PHOS: 78 U/L (ref 38–126)
ALT: 29 U/L (ref 17–63)
ANION GAP: 7 (ref 5–15)
AST: 16 U/L (ref 15–41)
BUN: 15 mg/dL (ref 6–20)
CALCIUM: 8.4 mg/dL — AB (ref 8.9–10.3)
CHLORIDE: 100 mmol/L — AB (ref 101–111)
CO2: 27 mmol/L (ref 22–32)
Creatinine, Ser: 0.56 mg/dL — ABNORMAL LOW (ref 0.61–1.24)
GFR calc Af Amer: >60 mL/min (ref >60)
GFR calc non Af Amer: >60 mL/min (ref >60)
GLUCOSE: 312 mg/dL — AB (ref 65–99)
POTASSIUM: 3.7 mmol/L (ref 3.5–5.1)
SODIUM: 134 mmol/L — AB (ref 135–145)
Total Bilirubin: 0.7 mg/dL (ref 0.3–1.2)
Total Protein: 5.3 g/dL — ABNORMAL LOW (ref 6.5–8.1)

## 2015-11-26 LAB — PROTIME-INR
INR: 1.04 (ref 0.00–1.49)
Prothrombin Time: 13.8 seconds (ref 11.6–15.2)

## 2015-11-26 LAB — CBC
HEMATOCRIT: 35.1 % — AB (ref 39.0–52.0)
HEMOGLOBIN: 12.3 g/dL — AB (ref 13.0–17.0)
MCH: 31.1 pg (ref 26.0–34.0)
MCHC: 35 g/dL (ref 30.0–36.0)
MCV: 88.6 fL (ref 78.0–100.0)
Platelets: 201 10*3/uL (ref 150–400)
RBC: 3.96 MIL/uL — ABNORMAL LOW (ref 4.22–5.81)
RDW: 12.4 % (ref 11.5–15.5)
WBC: 7 10*3/uL (ref 4.0–10.5)

## 2015-11-26 MED ORDER — INSULIN NPH ISOPHANE & REGULAR (70-30) 100 UNIT/ML ~~LOC~~ SUSP: 40.0000 [IU] | Freq: Two times a day (BID) | SUBCUTANEOUS | Status: DC

## 2015-11-26 MED ORDER — NICOTINE 21 MG/24HR TD PT24
21.0000 mg | MEDICATED_PATCH | Freq: Every day | TRANSDERMAL | Status: DC
  Administered 2015-11-26: 21 mg via TRANSDERMAL
  Filled 2015-11-26: qty 1

## 2015-11-26 MED ORDER — QUETIAPINE FUMARATE 100 MG PO TABS: 100.0000 mg | ORAL_TABLET | Freq: Two times a day (BID) | ORAL | Status: DC

## 2015-11-26 NOTE — Discharge Instructions (Signed)
° °  It is important that you read following instructions as well as go over your medication list with RN to help you understand your care after this hospitalization.  Discharge Instructions: 1.  please remain compliant with your insulin regimen 2.  please assist home health personnel, Re: Help in your medications.  3. follow up with PCP in 1 week  Diet recommendation: diabetic diet  Activity: The patient is advised to gradually reintroduce usual activities  Please request your primary care physician to go over all Hospital Tests and Procedure/Radiological results at the follow up,  Please get all Hospital records sent to your PCP by signing hospital release before you go home.    Do not drive, operating heavy machinery, perform activities at heights, swimming or participation in water activities or provide baby sitting services while your are on Pain, Sleep and Anxiety Medications; until you have been seen by Primary Care Physician or a Neurologist and advised to do so again.  Do not take more than prescribed Pain, Sleep and Anxiety Medications.  You were cared for by a hospitalist during your hospital stay. If you have any questions about your discharge medications or the care you received while you were in the hospital after you are discharged, you can call the unit and ask to speak with the hospitalist on call if the hospitalist that took care of you is not available.   Once you are discharged, your primary care physician will handle any further medical issues.  Please note that NO REFILLS for any discharge medications will be authorized once you are discharged, as it is imperative that you return to your primary care physician (or establish a relationship with a primary care physician if you do not have one) for your aftercare needs so that they can reassess your need for medications and monitor your lab values.  You Must read complete instructions/literature along with all the possible  adverse reactions/side effects for all the Medicines you take and that have been prescribed to you. Take any new Medicines after you have completely understood and accept all the possible adverse reactions/side effects.  Wear Seat belts while driving.  If you have smoked or chewed Tobacco in the last 2 yrs please stop smoking and/or stop any Recreational drug use.

## 2015-11-26 NOTE — Progress Notes (Signed)
CSW received a call from Pt nurse enquiring about the Pt's d/c plan. MD placed on a consult for SNF facility, however Pt is mobile. CSW explained that the Pt is mobile and would not qualify for SNF nor ALF.   CSW provided CM number for possible medication assistance.   CSW signing off.    [name] [facility]  [phone]

## 2015-11-26 NOTE — Progress Notes (Signed)
Patient discharged.  Leaving with one prescription and one to pick up at [facility].  [facility] will follow up with patient after discharge.  Bus ticket provided for patient.  Room air.  No complaints.

## 2015-11-26 NOTE — Discharge Summary (Signed)
[facility] Discharge Summary   Patient: [name]    GEZ:[id number] PCP: Jeanine Luz, FNP    DOB: 1986/03/19 Date of admission: 11/25/2015  Date of discharge: 11/26/2015   Discharge Diagnoses:  Principal Problem:   Schizoaffective disorder ([name]) Active Problems:   Diabetes type 1, uncontrolled ([name])   Dehydration   Hyperglycemia   Recommendations for Outpatient Follow-up:  Please follow up with PCP as well as psychiatry in 1 week  Diet recommendation: regular carb modified diet  Activity: The patient is advised to gradually reintroduce usual activities.  Discharge Condition: good  History of present illness: As per the H and P dictated on admission, "[name] is a 29 y.o. male with Past medical history of type 1 diabetes mellitus, schizoaffective disorder. Pt presented with complain of high blood sugar, history was limited due to patient seen after receiving haldol. Pt reportedly had ran out of his insulin and felt that he likely need to go to the hospital. He called EMS and EMS found the patient was not acting himself and was agitated and confused. Patient has not been taking any of his medication for at least a week. In the ER the patient was having flight of ideas and agitation and was yelling and cursing the staff member requiring physical restraints as well as Haldol. Patient was also assisted with security. After receiving the Haldol at the time of my evaluation the patient was calm and comfortable and was cooperative with the examination and history. He denied any complaints of chest pain and abdominal pain nausea vomiting, cough, fever, chills, diarrhea, constipation, burning urination at home. No fall no trauma no injury reported by patient as well."  Hospital Course:  Summary of his active problems in the hospital is as following.  1. Schizoaffective disorder ([name]) Patient presents with hyperglycemia and was found to have agitation, confusion,  hallucination or flight of ideas. Throughout his course in ED the patient was requiring restraint as well as IV Haldol. Patient was also seen by security. Reportedly Patient has been IVC in the ER. Patient was seen by TTS and due to his hyperglycemia they recommended the patient to be admitted in the hospital for observation to have 24-hour period of normal blood sugars prior to transitioning him to behavioral health.  Patient was seen by Dr Elsie Saas from psychiatry and as per his note, "Recommended to continue Seroquel 100 mg twice daily for undifferentiated schizophrenia and trazodone 200 mg at bedtime for insomnia, Patient will be referred to the outpatient medication management and medically stable Patient does not meet criteria for psychiatric inpatient admission. Patient denied any suicidal or homicidal thoughts." Thus the patient was felt safe to be discharged home, his IVC was rescinded after the consult and pt was discharged home. He requested new prescription for his medication and prescription for Seroquel was given until the pt follows up with PCP in 1 week. Pt was informed not to take Amphetamine medication in future since it was stopped last admission.  2. Type 1 diabetes mellitus. Uncontrolled. Hyperglycemia. Blood sugars are well controlled. We'll continue his home insulin.  Diabetes educator, dietitian, case manager were consulted for assistance with medication.  Social worker was consulted for transport requirement.  On the day of the discharge the patient's symptoms were under control, and no other acute medical condition were reported by patient. the patient was felt safe to be discharge at home with family support.  Procedures and Results:  none   Consultations:  Psychiatry  Discharge Exam: There were no vitals filed for this visit. Filed Vitals:   11/26/15 0519 11/26/15 1421  BP: 112/70 112/70  Pulse: 91 99  Temp: 97.6 F (36.4 C) 98 F (36.7 C)    Resp: 18 18    General: Appear in no distress, no Rash; Oral Mucosa moist. Cardiovascular: S1 and S2 Present, no Murmur, no JVD Respiratory: Bilateral Air entry present and Clear to Auscultation, no Crackles, nowheezes Abdomen: Bowel Sound present, Soft and no tenderness Extremities: no Pedal edema, no calf tenderness Neurology: Grossly no focal neuro deficit.  DISCHARGE MEDICATION:  Discharge Medication List as of 11/26/2015  4:36 PM    CONTINUE these medications which have CHANGED   Details  insulin NPH-regular Human (NOVOLIN 70/30 RELION) (70-30) 100 UNIT/ML injection Inject 40 Units into the skin 2 (two) times daily with a meal., Starting 11/26/2015, Until Discontinued, Print    QUEtiapine (SEROQUEL) 100 MG tablet Take 1 tablet (100 mg total) by mouth 2 (two) times daily., Starting 11/26/2015, Until Discontinued, Normal      CONTINUE these medications which have NOT CHANGED   Details  traZODone (DESYREL) 100 MG tablet Take 2 tablets (200 mg total) by mouth at bedtime., Starting 10/27/2015, Until Discontinued, Print    diphenhydrAMINE (BENADRYL) 25 mg capsule Take 1 capsule (25 mg total) by mouth every 8 (eight) hours as needed (anxiety)., Starting 11/08/2015, Until Discontinued, Normal    doxepin (SINEQUAN) 100 MG capsule Take 1 capsule (100 mg total) by mouth at bedtime., Starting 08/19/2015, Until Discontinued, Normal    feeding supplement, GLUCERNA SHAKE, (GLUCERNA SHAKE) LIQD Take 237 mLs by mouth 3 (three) times daily between meals., Starting 11/08/2015, Until Discontinued, Normal    Insulin Syringe-Needle U-100 (SAFETY-GLIDE 0.3CC SYR 29GX1/2) 29G X 1/2" 0.3 ML MISC Use as directed., Print    Lancets (FREESTYLE) lancets TEST 3 TIMES DAILY AND WHEN NEEDED, Historical Med    NAPROSYN 500 MG tablet Take 1 tablet (500 mg total) by mouth 2 (two) times daily with a meal., Starting 07/22/2015, Until Discontinued, Normal    omeprazole (PRILOSEC) 20 MG capsule Take 1 capsule (20 mg  total) by mouth daily., Starting 08/19/2015, Until Discontinued, Normal       Allergies  Allergen Reactions  . Sulfa Antibiotics Other (See Comments)    Unknown - Childhood   Follow-up Information    Follow up with Jeanine Luz, FNP. Schedule an appointment as soon as possible for a visit in 1 week.   Specialty:  Family Medicine   Contact information:   [address] [place] [address] [phone]       The results of significant diagnostics from this hospitalization (including imaging, microbiology, ancillary and laboratory) are listed below for reference.    Significant Diagnostic Studies: Dg Chest Port 1 View  11/05/2015  CLINICAL DATA:  Diabetes, uncontrolled hyperglycemia. EXAM: PORTABLE CHEST 1 VIEW COMPARISON:  PA and lateral chest x-ray of January 31, 2015 FINDINGS: The lungs are well-expanded and clear. The heart and pulmonary vascularity are normal. The mediastinum is normal in width. There is no pleural effusion or pneumothorax. The bony thorax is unremarkable. IMPRESSION: Stable hyperinflation.  There is no active cardiopulmonary disease. Electronically Signed   By: David  [place] M.D.   On: 11/05/2015 07:04   Dg Shoulder Left  11/04/2015  CLINICAL DATA:  Chronic left shoulder pain for 3-6 months. Initial encounter. EXAM: LEFT SHOULDER - 2+ VIEW COMPARISON:  Chest radiograph performed 01/31/2015 FINDINGS: There is no evidence of fracture or  dislocation. Mild cortical irregularity is noted along the anterior aspect of the left humeral head, possibly reflecting mild degenerative change. The left humeral head is seated within the glenoid fossa. The acromioclavicular joint is unremarkable in appearance. No significant soft tissue abnormalities are seen. The visualized portions of the left lung are clear. IMPRESSION: 1. No evidence of fracture or dislocation. 2. Mild cortical irregularity along the anterior aspect of the left humeral head, possibly reflecting mild degenerative  change. Given the chronicity of the patient's symptoms, MRI could be considered for further evaluation, as deemed clinically appropriate. Electronically Signed   By: Roanna Raider M.D.   On: 11/04/2015 22:26    Microbiology: No results found for this or any previous visit (from the past 240 hour(s)).   Labs: CBC:  Recent Labs Lab 11/25/15 1409 11/26/15 0535  WBC 7.0 7.0  HGB 14.5 12.3*  HCT 41.7 35.1*  MCV 89.5 88.6  PLT 223 201   Basic Metabolic Panel:  Recent Labs Lab 11/25/15 1409 11/26/15 0535  NA 134* 134*  K 4.1 3.7  CL 93* 100*  CO2 29 27  GLUCOSE 688* 312*  BUN 14 15  CREATININE 0.83 0.56*  CALCIUM 9.1 8.4*   Liver Function Tests:  Recent Labs Lab 11/26/15 0535  AST 16  ALT 29  ALKPHOS 78  BILITOT 0.7  PROT 5.3*  ALBUMIN 3.3*   CBG:  Recent Labs Lab 11/25/15 1809 11/25/15 2138 11/26/15 0600 11/26/15 0748 11/26/15 1219  GLUCAP 172* 159* 295* 309* 177*    Time spent: 30 minutes  Signed:  Nyana Haren  [facility] 11/26/2015, 7:04 PM

## 2015-11-26 NOTE — Progress Notes (Signed)
Nutrition Brief Note  Patient identified on the Malnutrition Screening Tool (MST) Report  Wt Readings from Last 15 Encounters:  11/08/15 137 lb 2 oz (62.2 kg)  08/19/15 137 lb (62.143 kg)  07/22/15 141 lb (63.957 kg)  06/23/15 154 lb 1.9 oz (69.908 kg)  06/02/15 161 lb 6 oz (73.2 kg)  05/26/15 155 lb (70.308 kg)  05/17/15 160 lb (72.576 kg)  03/29/15 152 lb 9.6 oz (69.219 kg)  03/19/15 189 lb 9.5 oz (86 kg)  01/31/15 132 lb 11.2 oz (60.192 kg)  01/24/15 143 lb 11.8 oz (65.2 kg)  12/24/14 151 lb (68.493 kg)  12/21/14 149 lb 1.6 oz (67.631 kg)  12/15/14 145 lb (65.772 kg)  11/18/14 140 lb (63.504 kg)    There is no weight on file to calculate BMI.   MST indicates that pt unsure if he has lost weight recently. Per chart review, pt's weight has been variably over the past 12 months. No new weight recorded since 11/08/15. Pt gained 12 lbs from 12/21/14-05/26/15. Question accuracy of weight recording on 06/02/15. Since 05/26/15, pt has lost 18 lbs (12% body weight)  Current diet order is Carb Modified, patient is consuming approximately 100% of meals at this time. Labs and medications reviewed.   No nutrition interventions warranted at this time. If nutrition issues arise, please consult RD.      Trenton Gammon, RD, LDN Inpatient Clinical Dietitian Pager # [phone] After hours/weekend pager # [phone]

## 2015-11-26 NOTE — Consult Note (Signed)
[place] [facility] Consult   Reason for Consult: Schizoaffective disorder current episode Mania and hyperglycemia Referring Physician:  Dr.Patel Patient Identification: [name] MRN:  [id number] Principal Diagnosis: Schizoaffective disorder [facility]) Diagnosis:   Patient Active Problem List   Diagnosis Date Noted  . Uncontrolled diabetes mellitus ([place]) [E11.65] 11/05/2015  . Diabetes mellitus with nonketotic hyperosmolarity ([place]) [E11.00] 11/05/2015  . Schizoaffective disorder ([place]) [F25.9]   . Left shoulder pain [M25.512] 08/19/2015  . Generalized headache [R51] 07/22/2015  . Diabetic ketoacidosis without coma associated with type 1 diabetes mellitus ([place]) [E10.10]   . Bilateral hip bursitis [M70.71, M70.72] 05/26/2015  . Schizoaffective disorder, bipolar type ([place]) [F25.0]   . Depression [F32.9]   . Hip pain [M25.559] 03/29/2015  . Schizoaffective disorder, unspecified type ([place]) [F25.9] 03/29/2015  . Generalized anxiety disorder [F41.1] 03/29/2015  . Undifferentiated schizophrenia ([place]) [F20.3]   . Schizophrenia ([place]) [F20.9] 03/20/2015  . HCAP (healthcare-associated pneumonia) [J18.9] 01/23/2015  . Hyperglycemia [R73.9] 12/21/2014  . DKA (diabetic ketoacidoses) ([place]) [E13.10] 11/18/2014  . Hip pain, bilateral [M25.551, M25.552] 11/18/2014  . DKA, type 1 ([place]) [E10.10] 06/17/2012  . Hyponatremia [E87.1] 06/17/2012  . Dehydration [E86.0] 06/17/2012  . Diabetes type 1, uncontrolled ([place]) [E10.65] 02/19/2012    Total Time spent with patient: 45 minutes  Subjective:   [name] is a 29 y.o. male patient admitted with hyperglycemia and bizarre behaviors.  HPI: [name] is a 29 y.o. male seen, chart reviewed and case discussed with Dr. Posey Pronto for face-to-face psychiatric consultation and evaluation. Patient admitted to [facility] with the hyperglycemia, bizarre behaviors including agitation after came to the hospital by calling  emergency medical services from home. Patient was not able to evaluated by behavioral health specialist in emergency department secondary to agitated state and acting out. Reportedly patient was yelling and cursing at the staff members which required physical restraints as well as chemical disturbance. Patient has been noncompliant with his medication management. Patient is known to this provider from his recent hospitalization at [facility] long and does not want to engage with the psychiatrist. Patient is also makes statement that he will be calling hospital security if the provider is not removed from his room without pursuing additional questions. Patient was left alone without further probing, questioning at this time and asking to contact back if he is ready to cooperate with psychiatric evaluation. Patient urine drug screen is positive for tetrahydrocannabinol.  Past Psychiatric History: He has few acute psych admission at [facility] and his last admission was April 2016 for non compliant with medications and bizarre behaviors with delusional thoughts. Patient has been diagnosed with undifferentiated schizophrenia or schizoaffective disorder. Previously was received medication Depakote and risperidone and currently taking Seroquel.  Risk to Self: Is patient at risk for suicide?: No Risk to Others:   Prior Inpatient Therapy:   Prior Outpatient Therapy:    Past Medical History:  Past Medical History  Diagnosis Date  . Depression   . Schizo affective schizophrenia ([place])   . Polysubstance abuse   . Scoliosis   . Chronic pain   . Noncompliance with medication regimen   . GERD (gastroesophageal reflux disease)   . Arthritis     "both hips" (12/21/2014)  . Asthma     Childhood  . Chicken pox   . Diabetes mellitus     Type 1 diabetes  . DKA (diabetic ketoacidoses) ([place]) 11/18/2014  . Hypertension     Past Surgical History  Procedure Laterality Date  .  Hand surgery Left     Left thumb surgery    Family History:  Family History  Problem Relation Age of Onset  . Diabetes Mother    Family Psychiatric  History: Unknown Social History:  History  Alcohol Use No    Comment: 12/21/2014 "I drink on a rare occasion"     History  Drug Use No    Comment: 12/21/2014 "I use marijuana on a rare occasion", last used marijuana 01/30/2015    Social History   Social History  . Marital Status: Legally Separated    Spouse Name: N/A  . Number of Children: 0  . Years of Education: 13   Occupational History  . Disability    Social History Main Topics  . Smoking status: Current Some Day Smoker -- 1.00 packs/day for 7 years    Types: Cigarettes  . Smokeless tobacco: Never Used  . Alcohol Use: No     Comment: 12/21/2014 "I drink on a rare occasion"  . Drug Use: No     Comment: 12/21/2014 "I use marijuana on a rare occasion", last used marijuana 01/30/2015  . Sexual Activity: Yes   Other Topics Concern  . None   Social History Narrative   ** Merged History Encounter **    Fun: Video games      Additional Social History:    Pain Medications: SEE MAR Prescriptions: SEE MAR Over the Counter: SEE MAR History of alcohol / drug use?: No history of alcohol / drug abuse                     Allergies:   Allergies  Allergen Reactions  . Sulfa Antibiotics Other (See Comments)    Unknown - Childhood    Labs:  Results for orders placed or performed during the hospital encounter of 11/25/15 (from the past 48 hour(s))  Basic metabolic panel     Status: Abnormal   Collection Time: 11/25/15  2:09 PM  Result Value Ref Range   Sodium 134 (L) 135 - 145 mmol/L   Potassium 4.1 3.5 - 5.1 mmol/L   Chloride 93 (L) 101 - 111 mmol/L   CO2 29 22 - 32 mmol/L   Glucose, Bld 688 (HH) 65 - 99 mg/dL    Comment: CRITICAL RESULT CALLED TO, READ BACK BY AND VERIFIED WITH: HAMBY M RN AT 1438 ON 12.8.16 BY MENDOZAB     BUN 14 6 - 20 mg/dL   Creatinine, Ser 0.83 0.61 - 1.24 mg/dL   Calcium 9.1 8.9  - 10.3 mg/dL   GFR calc non Af Amer >60 >60 mL/min   GFR calc Af Amer >60 >60 mL/min    Comment: (NOTE) The eGFR has been calculated using the CKD EPI equation. This calculation has not been validated in all clinical situations. eGFR's persistently <60 mL/min signify possible Chronic Kidney Disease.    Anion gap 12 5 - 15  CBC     Status: None   Collection Time: 11/25/15  2:09 PM  Result Value Ref Range   WBC 7.0 4.0 - 10.5 K/uL   RBC 4.66 4.22 - 5.81 MIL/uL   Hemoglobin 14.5 13.0 - 17.0 g/dL   HCT 41.7 39.0 - 52.0 %   MCV 89.5 78.0 - 100.0 fL   MCH 31.1 26.0 - 34.0 pg   MCHC 34.8 30.0 - 36.0 g/dL   RDW 12.3 11.5 - 15.5 %   Platelets 223 150 - 400 K/uL  Blood gas, venous  Status: Abnormal   Collection Time: 11/25/15  3:00 PM  Result Value Ref Range   pH, Ven 7.363 (H) 7.250 - 7.300   pCO2, Ven 53.2 (H) 45.0 - 50.0 mmHg   pO2, Ven 21.8 (LL) 30.0 - 45.0 mmHg    Comment: CRITICAL RESULT CALLED TO, READ BACK BY AND VERIFIED WITH:  BenitezLITTLE,MD AT 1510 BY LISA CRADDOCK,RRT,RCP ON 11/25/15    Bicarbonate 29.5 (H) 20.0 - 24.0 mEq/L   TCO2 26.6 0 - 100 mmol/L   Acid-Base Excess 3.4 (H) 0.0 - 2.0 mmol/L   O2 Saturation 42.9 %   Patient temperature 98.6    Collection site VENOUS    Drawn by COLLECTED BY LABORATORY    Sample type VENOUS   Urinalysis, Routine w reflex microscopic (not at [facility])     Status: Abnormal   Collection Time: 11/25/15  3:04 PM  Result Value Ref Range   Color, Urine YELLOW YELLOW   APPearance CLEAR CLEAR   Specific Gravity, Urine 1.039 (H) 1.005 - 1.030   pH 6.5 5.0 - 8.0   Glucose, UA >1000 (A) NEGATIVE mg/dL   Hgb urine dipstick NEGATIVE NEGATIVE   Bilirubin Urine NEGATIVE NEGATIVE   Ketones, ur 15 (A) NEGATIVE mg/dL   Protein, ur NEGATIVE NEGATIVE mg/dL   Nitrite NEGATIVE NEGATIVE   Leukocytes, UA NEGATIVE NEGATIVE  Urine microscopic-add on     Status: Abnormal   Collection Time: 11/25/15  3:04 PM  Result Value Ref Range   Squamous Epithelial /  LPF 0-5 (A) NONE SEEN   WBC, UA NONE SEEN 0 - 5 WBC/hpf   RBC / HPF NONE SEEN 0 - 5 RBC/hpf   Bacteria, UA NONE SEEN NONE SEEN  Urine rapid drug screen (hosp performed) (Not at [facility])     Status: Abnormal   Collection Time: 11/25/15  3:04 PM  Result Value Ref Range   Opiates NONE DETECTED NONE DETECTED   Cocaine NONE DETECTED NONE DETECTED   Benzodiazepines NONE DETECTED NONE DETECTED   Amphetamines NONE DETECTED NONE DETECTED   Tetrahydrocannabinol POSITIVE (A) NONE DETECTED   Barbiturates NONE DETECTED NONE DETECTED    Comment:        DRUG SCREEN FOR MEDICAL PURPOSES ONLY.  IF CONFIRMATION IS NEEDED FOR ANY PURPOSE, NOTIFY LAB WITHIN 5 DAYS.        LOWEST DETECTABLE LIMITS FOR URINE DRUG SCREEN Drug Class       Cutoff (ng/mL) Amphetamine      1000 Barbiturate      200 Benzodiazepine   893 Tricyclics       734 Opiates          300 Cocaine          300 THC              50   I-Stat CG4 Lactic Acid, ED     Status: None   Collection Time: 11/25/15  3:07 PM  Result Value Ref Range   Lactic Acid, Venous 1.13 0.5 - 2.0 mmol/L  CBG monitoring, ED     Status: Abnormal   Collection Time: 11/25/15  4:30 PM  Result Value Ref Range   Glucose-Capillary 410 (H) 65 - 99 mg/dL  CBG monitoring, ED     Status: Abnormal   Collection Time: 11/25/15  6:09 PM  Result Value Ref Range   Glucose-Capillary 172 (H) 65 - 99 mg/dL  Glucose, capillary     Status: Abnormal   Collection Time: 11/25/15  9:38 PM  Result  Value Ref Range   Glucose-Capillary 159 (H) 65 - 99 mg/dL  Comprehensive metabolic panel     Status: Abnormal   Collection Time: 11/26/15  5:35 AM  Result Value Ref Range   Sodium 134 (L) 135 - 145 mmol/L   Potassium 3.7 3.5 - 5.1 mmol/L   Chloride 100 (L) 101 - 111 mmol/L   CO2 27 22 - 32 mmol/L   Glucose, Bld 312 (H) 65 - 99 mg/dL   BUN 15 6 - 20 mg/dL   Creatinine, Ser 0.56 (L) 0.61 - 1.24 mg/dL   Calcium 8.4 (L) 8.9 - 10.3 mg/dL   Total Protein 5.3 (L) 6.5 - 8.1 g/dL    Albumin 3.3 (L) 3.5 - 5.0 g/dL   AST 16 15 - 41 U/L   ALT 29 17 - 63 U/L   Alkaline Phosphatase 78 38 - 126 U/L   Total Bilirubin 0.7 0.3 - 1.2 mg/dL   GFR calc non Af Amer >60 >60 mL/min   GFR calc Af Amer >60 >60 mL/min    Comment: (NOTE) The eGFR has been calculated using the CKD EPI equation. This calculation has not been validated in all clinical situations. eGFR's persistently <60 mL/min signify possible Chronic Kidney Disease.    Anion gap 7 5 - 15  CBC     Status: Abnormal   Collection Time: 11/26/15  5:35 AM  Result Value Ref Range   WBC 7.0 4.0 - 10.5 K/uL   RBC 3.96 (L) 4.22 - 5.81 MIL/uL   Hemoglobin 12.3 (L) 13.0 - 17.0 g/dL   HCT 35.1 (L) 39.0 - 52.0 %   MCV 88.6 78.0 - 100.0 fL   MCH 31.1 26.0 - 34.0 pg   MCHC 35.0 30.0 - 36.0 g/dL   RDW 12.4 11.5 - 15.5 %   Platelets 201 150 - 400 K/uL  Protime-INR     Status: None   Collection Time: 11/26/15  5:35 AM  Result Value Ref Range   Prothrombin Time 13.8 11.6 - 15.2 seconds   INR 1.04 0.00 - 1.49  Glucose, capillary     Status: Abnormal   Collection Time: 11/26/15  6:00 AM  Result Value Ref Range   Glucose-Capillary 295 (H) 65 - 99 mg/dL  Glucose, capillary     Status: Abnormal   Collection Time: 11/26/15  7:48 AM  Result Value Ref Range   Glucose-Capillary 309 (H) 65 - 99 mg/dL    Current Facility-Administered Medications  Medication Dose Route Frequency Provider Last Rate Last Dose  . acetaminophen (TYLENOL) tablet 650 mg  650 mg Oral Q6H PRN Lavina Hamman, MD       Or  . acetaminophen (TYLENOL) suppository 650 mg  650 mg Rectal Q6H PRN Lavina Hamman, MD      . diphenhydrAMINE (BENADRYL) capsule 25 mg  25 mg Oral Q8H PRN Lavina Hamman, MD      . doxepin (SINEQUAN) capsule 100 mg  100 mg Oral QHS Lavina Hamman, MD   100 mg at 11/25/15 2140  . enoxaparin (LOVENOX) injection 40 mg  40 mg Subcutaneous Q24H Lavina Hamman, MD   40 mg at 11/25/15 2140  . haloperidol lactate (HALDOL) injection 2 mg  2 mg  Intravenous Q6H PRN Lavina Hamman, MD   2 mg at 11/26/15 8325  . insulin aspart (novoLOG) injection 0-15 Units  0-15 Units Subcutaneous TID WC Lavina Hamman, MD   11 Units at 11/26/15 [phone]  . insulin aspart (  novoLOG) injection 0-5 Units  0-5 Units Subcutaneous QHS Lavina Hamman, MD   0 Units at 11/25/15 2141  . insulin aspart protamine- aspart (NOVOLOG MIX 70/30) injection 40 Units  40 Units Subcutaneous BID WC Lavina Hamman, MD   40 Units at 11/26/15 [phone]  . ondansetron (ZOFRAN) tablet 4 mg  4 mg Oral Q6H PRN Lavina Hamman, MD       Or  . ondansetron [facility]) injection 4 mg  4 mg Intravenous Q6H PRN Lavina Hamman, MD      . pantoprazole (PROTONIX) EC tablet 40 mg  40 mg Oral Daily Lavina Hamman, MD   40 mg at 11/25/15 2141  . QUEtiapine (SEROQUEL) tablet 100 mg  100 mg Oral BID Lavina Hamman, MD   100 mg at 11/25/15 2140  . traZODone (DESYREL) tablet 200 mg  200 mg Oral QHS Lavina Hamman, MD   200 mg at 11/25/15 2140    Musculoskeletal: Strength & Muscle Tone: within normal limits Gait & Station: unable to stand Patient leans: N/A  Psychiatric Specialty Exam: ROS unable to obtain secondary to noncooperative behavior   Blood pressure 112/70, pulse 91, temperature 97.6 F (36.4 C), temperature source Axillary, resp. rate 18, SpO2 97 %.There is no weight on file to calculate BMI.  General Appearance: Disheveled and Guarded  Eye Contact::  Good  Speech:  Clear and Coherent  Volume:  Increased  Mood:  Angry and Irritable  Affect:  Non-Congruent, Inappropriate and Labile  Thought Process:  Disorganized and Irrelevant  Orientation:  Full (Time, Place, and Person)  Thought Content:  Paranoid Ideation and Rumination  Suicidal Thoughts:  No  Homicidal Thoughts:  No  Memory:  Immediate;   Fair Recent;   Fair  Judgement:  Impaired  Insight:  Shallow  Psychomotor Activity:  Increased  Concentration:  Fair  Recall:  [employer] of Knowledge:Fair  Language: Good  Akathisia:   Negative  Handed:  Right  AIMS (if indicated):     Assets:  Communication Skills Desire for Improvement Financial Resources/Insurance Housing Leisure Time Resilience Social Support  ADL's:  Intact  Cognition: Impaired,  Mild  Sleep:      Treatment Plan Summary: Daily contact with patient to assess and evaluate symptoms and progress in treatment and Medication management   Patient is poorly cooperative during this evaluation same as last month psychiatric consultation and evaluation  Patient is noncompliant with medication management and continue to have cannabis abuse Recommended to continue Seroquel 100 mg twice daily for undifferentiated schizophrenia and trazodone 200 mg at bedtime for insomnia  Unit social worker may contact family regarding collateral information and also regarding outpatient medication management   Disposition: Patient will be referred to the outpatient medication management and medically stable Patient does not meet criteria for psychiatric inpatient admission. Supportive therapy provided about ongoing stressors.  [name],Christopher R. 11/26/2015 11:02 AM

## 2015-11-26 NOTE — Progress Notes (Addendum)
Inpatient Diabetes Program Recommendations  AACE/ADA: New Consensus Statement on Inpatient Glycemic Control (2015)  Target Ranges:  Prepandial:   less than 140 mg/dL      Peak postprandial:   less than 180 mg/dL (1-2 hours)      Critically ill patients:  140 - 180 mg/dL   Results for [name], [name] (MRN [id number]) as of 11/26/2015 09:16  Ref. Range 11/25/2015 14:09  Sodium Latest Ref Range: 135-145 mmol/L 134 (L)  Potassium Latest Ref Range: 3.5-5.1 mmol/L 4.1  Chloride Latest Ref Range: 101-111 mmol/L 93 (L)  CO2 Latest Ref Range: 22-32 mmol/L 29  BUN Latest Ref Range: 6-20 mg/dL 14  Creatinine Latest Ref Range: 0.61-1.24 mg/dL 0.83  Calcium Latest Ref Range: 8.9-10.3 mg/dL 9.1  EGFR (Non-African Amer.) Latest Ref Range: >60 mL/min >60  EGFR (African American) Latest Ref Range: >60 mL/min >60  Glucose Latest Ref Range: 65-99 mg/dL 688 (HH)  Anion gap Latest Ref Range: 5-15  12    Admit with: Hyperglycemia/ Schizoaffective disorder  History: Type 1 DM  Home DM Meds: 70/30 insulin- 40 units bidwc (per notes, patient not taking insulin- ran out several days prior to admission)  Current Insulin Orders: 70/30 insulin- 40 units bidwc       Novolog Moderate SSI (0-15 units) TID AC + HS     -Patient is well known to the Inpatient Glycemic Control Team. He has multiple admissions and ED visits since 11/2014 (for both Psych issues and Hyperglycemia, Running out of insulin, etc).  -Not sure what else to do for this patient. He will take his insulin intermittently at home and note that patient has a history of psychiatric illness.  -Patient has claimed in the past he cannot afford his insulin. Please provide patient with a Rx for Novolin Reli-on 70/30 insulin at time of d/c [Order #[id number]]. Patient can purchase this particular brand of 70/30 insulin at [facility] for $25 per vial.  -Note patient to be transferred to [facility] once CBGs and other issues stabilize.  -70/30  insulin- 40 units bidwc started this morning with breakfast.     --Will follow patient during hospitalization--  Wyn Quaker RN, MSN, CDE Diabetes Coordinator Inpatient Glycemic Control Team Team Pager: [phone] (8a-5p)

## 2015-12-02 ENCOUNTER — Other Ambulatory Visit: Payer: Self-pay

## 2015-12-02 NOTE — Patient Outreach (Signed)
[facility]) Care Management  12/02/2015  [name] 01-27-1986 [id number]  Assessment: 28 year old with recent admission schizoaffective/hyperglycemia. Follow up call. No answer. HIPPA compliant message left. Outreach letter sent 11/28 followed by outreach attempt 12/7 and 12/15.  Plan: nursing services will close case out of case; update [facility] pharmacist, Caroline Sauger.   Kathyrn Sheriff, RN, MSN, [facility] Community Care Coordinator Cell: [phone]

## 2015-12-03 ENCOUNTER — Emergency Department (HOSPITAL_COMMUNITY)
Admission: EM | Admit: 2015-12-03 | Discharge: 2015-12-03 | Discharge status: Against Medical Advice | Payer: PPO | Attending: Emergency Medicine | Admitting: Emergency Medicine

## 2015-12-03 ENCOUNTER — Encounter (HOSPITAL_COMMUNITY): Payer: Self-pay | Admitting: Oncology

## 2015-12-03 ENCOUNTER — Emergency Department (HOSPITAL_COMMUNITY)
Admission: EM | Admit: 2015-12-03 | Discharge: 2015-12-03 | Disposition: A | Discharge status: Home / Self Care | Payer: PPO | Source: Home / Self Care | Attending: Emergency Medicine | Admitting: Emergency Medicine

## 2015-12-03 ENCOUNTER — Encounter (HOSPITAL_COMMUNITY): Payer: Self-pay | Admitting: Emergency Medicine

## 2015-12-03 DIAGNOSIS — R739 Hyperglycemia, unspecified: Secondary | ICD-10-CM

## 2015-12-03 DIAGNOSIS — F17210 Nicotine dependence, cigarettes, uncomplicated: Secondary | ICD-10-CM | POA: Diagnosis not present

## 2015-12-03 DIAGNOSIS — Z9119 Patient's noncompliance with other medical treatment and regimen: Secondary | ICD-10-CM | POA: Insufficient documentation

## 2015-12-03 DIAGNOSIS — M1990 Unspecified osteoarthritis, unspecified site: Secondary | ICD-10-CM | POA: Insufficient documentation

## 2015-12-03 DIAGNOSIS — Z794 Long term (current) use of insulin: Secondary | ICD-10-CM

## 2015-12-03 DIAGNOSIS — F250 Schizoaffective disorder, bipolar type: Secondary | ICD-10-CM | POA: Insufficient documentation

## 2015-12-03 DIAGNOSIS — E109 Type 1 diabetes mellitus without complications: Secondary | ICD-10-CM

## 2015-12-03 DIAGNOSIS — I10 Essential (primary) hypertension: Secondary | ICD-10-CM | POA: Insufficient documentation

## 2015-12-03 DIAGNOSIS — K219 Gastro-esophageal reflux disease without esophagitis: Secondary | ICD-10-CM | POA: Diagnosis not present

## 2015-12-03 DIAGNOSIS — R6883 Chills (without fever): Secondary | ICD-10-CM | POA: Diagnosis present

## 2015-12-03 DIAGNOSIS — Z79899 Other long term (current) drug therapy: Secondary | ICD-10-CM

## 2015-12-03 DIAGNOSIS — J45909 Unspecified asthma, uncomplicated: Secondary | ICD-10-CM | POA: Insufficient documentation

## 2015-12-03 DIAGNOSIS — F329 Major depressive disorder, single episode, unspecified: Secondary | ICD-10-CM | POA: Diagnosis not present

## 2015-12-03 DIAGNOSIS — Z8619 Personal history of other infectious and parasitic diseases: Secondary | ICD-10-CM

## 2015-12-03 DIAGNOSIS — Z760 Encounter for issue of repeat prescription: Secondary | ICD-10-CM | POA: Insufficient documentation

## 2015-12-03 DIAGNOSIS — G8929 Other chronic pain: Secondary | ICD-10-CM | POA: Insufficient documentation

## 2015-12-03 DIAGNOSIS — Z9114 Patient's other noncompliance with medication regimen: Secondary | ICD-10-CM

## 2015-12-03 DIAGNOSIS — M419 Scoliosis, unspecified: Secondary | ICD-10-CM | POA: Insufficient documentation

## 2015-12-03 DIAGNOSIS — E1065 Type 1 diabetes mellitus with hyperglycemia: Secondary | ICD-10-CM | POA: Insufficient documentation

## 2015-12-03 LAB — URINALYSIS, ROUTINE W REFLEX MICROSCOPIC
BILIRUBIN URINE: NEGATIVE
HGB URINE DIPSTICK: NEGATIVE
KETONES UR: 40 mg/dL — AB
LEUKOCYTES UA: NEGATIVE
Nitrite: NEGATIVE
PH: 6 (ref 5.0–8.0)
Protein, ur: NEGATIVE mg/dL
SPECIFIC GRAVITY, URINE: 1.036 — AB (ref 1.005–1.030)

## 2015-12-03 LAB — COMPREHENSIVE METABOLIC PANEL
ALBUMIN: 4.8 g/dL (ref 3.5–5.0)
ALT: 23 U/L (ref 17–63)
ANION GAP: 13 (ref 5–15)
AST: 22 U/L (ref 15–41)
Alkaline Phosphatase: 93 U/L (ref 38–126)
BILIRUBIN TOTAL: 1.3 mg/dL — AB (ref 0.3–1.2)
BUN: 14 mg/dL (ref 6–20)
CHLORIDE: 90 mmol/L — AB (ref 101–111)
CO2: 26 mmol/L (ref 22–32)
Calcium: 9 mg/dL (ref 8.9–10.3)
Creatinine, Ser: 0.98 mg/dL (ref 0.61–1.24)
GFR calc Af Amer: >60 mL/min (ref >60)
GFR calc non Af Amer: >60 mL/min (ref >60)
GLUCOSE: 787 mg/dL — AB (ref 65–99)
POTASSIUM: 4.6 mmol/L (ref 3.5–5.1)
SODIUM: 129 mmol/L — AB (ref 135–145)
Total Protein: 7.3 g/dL (ref 6.5–8.1)

## 2015-12-03 LAB — CBC WITH DIFFERENTIAL/PLATELET
BASOS ABS: 0 10*3/uL (ref 0.0–0.1)
BASOS PCT: 1 %
EOS ABS: 0.1 10*3/uL (ref 0.0–0.7)
Eosinophils Relative: 1 %
HEMATOCRIT: 45.3 % (ref 39.0–52.0)
HEMOGLOBIN: 15.7 g/dL (ref 13.0–17.0)
Lymphocytes Relative: 14 %
Lymphs Abs: 1.1 10*3/uL (ref 0.7–4.0)
MCH: 31.2 pg (ref 26.0–34.0)
MCHC: 34.7 g/dL (ref 30.0–36.0)
MCV: 89.9 fL (ref 78.0–100.0)
MONO ABS: 0.4 10*3/uL (ref 0.1–1.0)
MONOS PCT: 5 %
NEUTROS ABS: 6.4 10*3/uL (ref 1.7–7.7)
NEUTROS PCT: 79 %
Platelets: 279 10*3/uL (ref 150–400)
RBC: 5.04 MIL/uL (ref 4.22–5.81)
RDW: 12.7 % (ref 11.5–15.5)
WBC: 8 10*3/uL (ref 4.0–10.5)

## 2015-12-03 LAB — CBG MONITORING, ED
Glucose-Capillary: 283 mg/dL — ABNORMAL HIGH (ref 65–99)
Glucose-Capillary: 203 mg/dL — ABNORMAL HIGH (ref 65–99)

## 2015-12-03 LAB — RAPID URINE DRUG SCREEN, HOSP PERFORMED
Amphetamines: NOT DETECTED
BARBITURATES: NOT DETECTED
Benzodiazepines: NOT DETECTED
COCAINE: NOT DETECTED
Opiates: NOT DETECTED
Tetrahydrocannabinol: NOT DETECTED

## 2015-12-03 LAB — URINE MICROSCOPIC-ADD ON
BACTERIA UA: NONE SEEN
RBC / HPF: NONE SEEN RBC/hpf (ref 0–5)
WBC, UA: NONE SEEN WBC/hpf (ref 0–5)

## 2015-12-03 MED ORDER — INSULIN ASPART PROT & ASPART (70-30 MIX) 100 UNIT/ML ~~LOC~~ SUSP
40.0000 [IU] | Freq: Once | SUBCUTANEOUS | Status: AC
  Administered 2015-12-03: 40 [IU] via SUBCUTANEOUS
  Filled 2015-12-03: qty 10

## 2015-12-03 MED ORDER — TRAZODONE HCL 100 MG PO TABS: 200.0000 mg | ORAL_TABLET | Freq: Once | ORAL | Status: DC

## 2015-12-03 MED ORDER — INSULIN ASPART 100 UNIT/ML ~~LOC~~ SOLN
15.0000 [IU] | Freq: Once | SUBCUTANEOUS | Status: AC
  Administered 2015-12-03: 15 [IU] via SUBCUTANEOUS
  Filled 2015-12-03: qty 1

## 2015-12-03 MED ORDER — TRAZODONE HCL 100 MG PO TABS
200.0000 mg | ORAL_TABLET | Freq: Every day | ORAL | Status: DC
  Administered 2015-12-03: 200 mg via ORAL
  Filled 2015-12-03: qty 2

## 2015-12-03 MED ORDER — QUETIAPINE FUMARATE 100 MG PO TABS
100.0000 mg | ORAL_TABLET | Freq: Two times a day (BID) | ORAL | Status: DC
  Administered 2015-12-03: 100 mg via ORAL
  Filled 2015-12-03: qty 1

## 2015-12-03 MED ORDER — LORAZEPAM 2 MG/ML IJ SOLN
1.0000 mg | Freq: Once | INTRAMUSCULAR | Status: AC | PRN
  Administered 2015-12-03: 1 mg via INTRAVENOUS
  Filled 2015-12-03: qty 1

## 2015-12-03 MED ORDER — SODIUM CHLORIDE 0.9 % IV BOLUS (SEPSIS)
1000.0000 mL | Freq: Once | INTRAVENOUS | Status: AC
  Administered 2015-12-03: 1000 mL via INTRAVENOUS

## 2015-12-03 MED ORDER — AMPHETAMINE-DEXTROAMPHETAMINE 20 MG PO TABS
20.0000 mg | ORAL_TABLET | Freq: Once | ORAL | Status: DC
Start: 2015-12-03 — End: 2015-12-03

## 2015-12-03 MED ORDER — QUETIAPINE FUMARATE 100 MG PO TABS: 100.0000 mg | ORAL_TABLET | Freq: Once | ORAL | Status: DC

## 2015-12-03 NOTE — ED Notes (Signed)
CBG  "high". PA advised POC edit sheet completed

## 2015-12-03 NOTE — Progress Notes (Signed)
CM consult for medication assistance  This pt is confirmed to have [employer] insurance coverage therefore there is not a [facility] medication assistance program to assist a pt with medication benefits per their insurance plan  ED RN and ED charge RN updated No further ED Cm interventions needed

## 2015-12-03 NOTE — ED Provider Notes (Signed)
CSN: [id number]     Arrival date & time 12/03/15  1202 History  By signing my name below, I, [name], attest that this documentation has been prepared under the direction and in the presence of Christopher Sauer, PA-C Electronically Signed: Soijett [name], ED Scribe. 12/03/2015. 1:00 PM.    Chief Complaint  Patient presents with  . Chills    pt c/o chills x 2 days      The history is provided by the patient. No language interpreter was used.    HPI Comments: [name] is a 29 y.o. male with a medical hx of HTN, DM, and Schizo affective schizophrenia, who presents to the Emergency Department via EMS complaining of chills x 2 days. He notes that he is having radiating chills from his ankles and calves and it non-radiating. He reports that last night he slept in front of his oven and still couldn't get warm. He states that he doesn't have any of his DM medications due to financial issues.   He notes that he was seen on 11/25/2015 and had labs completed, and had IVC paperwork drawn on him and dx with mania, schizoaffective disorder, and hyperglycemia. He states that he has not tried any medications for the relief for his symptoms. He denies CP, abdominal pain, n/v, fever, chills, diarrhea, constipation, numbness, tingling, and any other symptoms. Denies allergies to any medications.   Per EMS personnel: EMS was called by [employer] to bring the pt to the ED for a welfare check.   Past Medical History  Diagnosis Date  . Depression   . Schizo affective schizophrenia ([name])   . Polysubstance abuse   . Scoliosis   . Chronic pain   . Noncompliance with medication regimen   . GERD (gastroesophageal reflux disease)   . Arthritis     "both hips" (12/21/2014)  . Asthma     Childhood  . Chicken pox   . Diabetes mellitus     Type 1 diabetes  . DKA (diabetic ketoacidoses) ([name]) 11/18/2014  . Hypertension    Past Surgical History  Procedure Laterality Date  . Hand surgery Left     Left thumb  surgery   Family History  Problem Relation Age of Onset  . Diabetes Mother    Social History  Substance Use Topics  . Smoking status: Current Some Day Smoker -- 1.00 packs/day for 7 years    Types: Cigarettes  . Smokeless tobacco: Never Used  . Alcohol Use: No     Comment: 12/21/2014 "I drink on a rare occasion"    Review of Systems  Constitutional: Positive for chills. Negative for fever.  Cardiovascular: Negative for chest pain.  Gastrointestinal: Negative for nausea, vomiting, abdominal pain, diarrhea and constipation.  Neurological: Negative for numbness.       No tingling      Allergies  Sulfa antibiotics  Home Medications   Prior to Admission medications   Medication Sig Start Date End Date Taking? Authorizing Provider  diphenhydrAMINE (BENADRYL) 25 mg capsule Take 1 capsule (25 mg total) by mouth every 8 (eight) hours as needed (anxiety). Patient not taking: Reported on 11/25/2015 11/08/15   Albertine Grates, MD  doxepin (SINEQUAN) 100 MG capsule Take 1 capsule (100 mg total) by mouth at bedtime. Patient not taking: Reported on 11/25/2015 08/19/15   Veryl Speak, FNP  feeding supplement, GLUCERNA SHAKE, (GLUCERNA SHAKE) LIQD Take 237 mLs by mouth 3 (three) times daily between meals. Patient not taking: Reported on 11/25/2015 11/08/15  Albertine Grates, MD  insulin NPH-regular Human (NOVOLIN 70/30 RELION) (70-30) 100 UNIT/ML injection Inject 40 Units into the skin 2 (two) times daily with a meal. 11/26/15   Rolly Salter, MD  Insulin Syringe-Needle U-100 (SAFETY-GLIDE 0.3CC SYR 29GX1/2) 29G X 1/2" 0.3 ML MISC Use as directed. Patient not taking: Reported on 11/25/2015 06/03/15   Elease Etienne, MD  Lancets (FREESTYLE) lancets TEST 3 TIMES DAILY AND WHEN NEEDED 03/29/15   Historical Provider, MD  NAPROSYN 500 MG tablet Take 1 tablet (500 mg total) by mouth 2 (two) times daily with a meal. Patient not taking: Reported on 11/04/2015 07/22/15   Veryl Speak, FNP  omeprazole (PRILOSEC) 20  MG capsule Take 1 capsule (20 mg total) by mouth daily. Patient not taking: Reported on 11/04/2015 08/19/15   Veryl Speak, FNP  QUEtiapine (SEROQUEL) 100 MG tablet Take 1 tablet (100 mg total) by mouth 2 (two) times daily. 11/26/15   Rolly Salter, MD  traZODone (DESYREL) 100 MG tablet Take 2 tablets (200 mg total) by mouth at bedtime. 10/27/15   Veryl Speak, FNP   BP 119/80 mmHg  Pulse 113  Temp(Src) 98.1 F (36.7 C) (Oral)  Resp 20  SpO2 99% Physical Exam  Constitutional: He is oriented to person, place, and time. He appears well-developed and well-nourished. No distress.  HENT:  Head: Normocephalic and atraumatic.  Eyes: EOM are normal.  Neck: Neck supple.  Cardiovascular: Normal rate, regular rhythm and normal heart sounds.  Exam reveals no gallop and no friction rub.   No murmur heard. Pulmonary/Chest: Effort normal and breath sounds normal. No respiratory distress. He has no wheezes. He has no rales.  Abdominal: Soft. There is tenderness (generalized).  Musculoskeletal: Normal range of motion.  Neurological: He is alert and oriented to person, place, and time.  Skin: Skin is warm and dry.  Psychiatric: He has a normal mood and affect. His behavior is normal.  Nursing note and vitals reviewed.   ED Course  Procedures (including critical care time) DIAGNOSTIC STUDIES: Oxygen Saturation is 99% on RA, nl by my interpretation.    COORDINATION OF CARE: 12:59 PM Discussed treatment plan with pt at bedside which includes CBG and pt agreed to plan.    Labs Review Labs Reviewed  CBG MONITORING, ED - Abnormal; Notable for the following:    Glucose-Capillary >600 (*)    All other components within normal limits    Imaging Review No results found. I have personally reviewed and evaluated these lab results as part of my medical decision-making.   EKG Interpretation None      MDM   Final diagnoses:  None   Due to acuity of patient's condition, he was  transferred out of fast track for more thorough evaluation and management.   I personally performed the services described in this documentation, which was scribed in my presence. The recorded information has been reviewed and is accurate.    [facility] Caroleann Casler, PA-C 12/03/15 1450  Mancel Bale, MD 12/03/15 [phone]

## 2015-12-03 NOTE — ED Notes (Signed)
RN getting labs and starting IV

## 2015-12-03 NOTE — ED Notes (Signed)
Pt complains of being cold after sleeping in front of his oven as a primary heat source last night. He states that the heat in his apartment is non-functional but was not fully repaired by his landlord. He has no additional heat source. Also states he has type 1 diabetes but cannot afford his insulin. Patient given warm blankets for comfort and diet coke with ice per patient request.

## 2015-12-03 NOTE — Progress Notes (Addendum)
Entered in d/c instructions  You have been given resources for [employer], [employer] DSS, [employer], [employer], family services of the [place]- all of resources to help with food, bills, debts, financial assistance,etc) the food pantry and a list of [facility] team [facility] doctors as you requested  Schedule an appointment as soon as possible for a visit on 12/03/2015 Please use these items to find a doctor for follow up care and home services as needed

## 2015-12-03 NOTE — ED Notes (Signed)
Pt stated that he had felt chills over last two days. Stated that he sleeps in front of his oven for heat in his apartment but goes to the shelter for meals. Pt stated that he does not take his insulin as prescribed. He usually has a blood sugar in "400". Pt is alert and cooperative at present.

## 2015-12-03 NOTE — ED Notes (Signed)
Bed: KA76 Expected date:  Expected time:  Means of arrival:  Comments: Hold for room 30 cbg >600

## 2015-12-03 NOTE — ED Notes (Signed)
Pt has been given multiple sandwiches, coffee and soda during tx in the ED. Pt requesting additional food and beverages. Explained to patient we are unable to continually given one patient food and beverages. Pt states to writer "fuck you, you dumb cunt" pt then stood and slammed door to his room. Pt noted to have trash and food strewn through out the room.

## 2015-12-03 NOTE — ED Notes (Signed)
Critical called Glucose 787 mg/dl RN aware

## 2015-12-03 NOTE — ED Provider Notes (Signed)
CSN: [id number]     Arrival date & time 12/03/15  1202 History   First MD Initiated Contact with Patient 12/03/15 1216     Chief Complaint  Patient presents with  . Chills    pt c/o chills x 2 days     (Consider location/radiation/quality/duration/timing/severity/associated sxs/prior Treatment) HPI   [name] is a 29 y.o. male who presents for evaluation of feeling cold. He is living in a house with no heat, consequently he sleeps in front of his oven. This is apparently an [employer], operational. He is using the oven as a heat source. Apparently, police officers were called to his home for a welfare check. They called EMS to bring the patient here. He was discharged from the hospital recently after an overnight stay for a similar problem. During that stay, he was disruptive and agitated, and was involuntarily committed for a period of time. He was also seen by psychiatry who recommended that he take Seroquel and trazodone. He states that he is not taking these medicines, and has not followed up with a psychiatrist in the outpatient setting as was directed. Furthermore, he states that he has found a complaint against a psychiatrist who saw him in the hospital. He is vague about why he plans on doing this. He denies recent fever, chills, cough, shortness of breath, weakness or dizziness. He states that he is not taking any of his prescribed medications. He denies use of alcohol or illegal drugs. There are no other known modifying factors.   Past Medical History  Diagnosis Date  . Depression   . Schizo affective schizophrenia ([name])   . Polysubstance abuse   . Scoliosis   . Chronic pain   . Noncompliance with medication regimen   . GERD (gastroesophageal reflux disease)   . Arthritis     "both hips" (12/21/2014)  . Asthma     Childhood  . Chicken pox   . Diabetes mellitus     Type 1 diabetes  . DKA (diabetic ketoacidoses) ([name]) 11/18/2014  . Hypertension    Past Surgical History   Procedure Laterality Date  . Hand surgery Left     Left thumb surgery   Family History  Problem Relation Age of Onset  . Diabetes Mother    Social History  Substance Use Topics  . Smoking status: Current Some Day Smoker -- 1.00 packs/day for 7 years    Types: Cigarettes  . Smokeless tobacco: Never Used  . Alcohol Use: No     Comment: 12/21/2014 "I drink on a rare occasion"    Review of Systems  All other systems reviewed and are negative.     Allergies  Sulfa antibiotics  Home Medications   Prior to Admission medications   Medication Sig Start Date End Date Taking? Authorizing Provider  amphetamine-dextroamphetamine (ADDERALL) 20 MG tablet Take 20 mg by mouth 2 (two) times daily.   Yes Historical Provider, MD  insulin NPH-regular Human (NOVOLIN 70/30 RELION) (70-30) 100 UNIT/ML injection Inject 40 Units into the skin 2 (two) times daily with a meal. 11/26/15  Yes Rolly Salter, MD  QUEtiapine (SEROQUEL) 100 MG tablet Take 1 tablet (100 mg total) by mouth 2 (two) times daily. 11/26/15  Yes Rolly Salter, MD  traZODone (DESYREL) 100 MG tablet Take 2 tablets (200 mg total) by mouth at bedtime. 10/27/15  Yes Veryl Speak, FNP  diphenhydrAMINE (BENADRYL) 25 mg capsule Take 1 capsule (25 mg total) by mouth every 8 (eight) hours  as needed (anxiety). Patient not taking: Reported on 11/25/2015 11/08/15   Albertine Grates, MD  doxepin (SINEQUAN) 100 MG capsule Take 1 capsule (100 mg total) by mouth at bedtime. Patient not taking: Reported on 11/25/2015 08/19/15   Veryl Speak, FNP  feeding supplement, GLUCERNA SHAKE, (GLUCERNA SHAKE) LIQD Take 237 mLs by mouth 3 (three) times daily between meals. Patient not taking: Reported on 11/25/2015 11/08/15   Albertine Grates, MD  Insulin Syringe-Needle U-100 (SAFETY-GLIDE 0.3CC SYR 29GX1/2) 29G X 1/2" 0.3 ML MISC Use as directed. Patient not taking: Reported on 11/25/2015 06/03/15   Elease Etienne, MD  NAPROSYN 500 MG tablet Take 1 tablet (500 mg total) by  mouth 2 (two) times daily with a meal. Patient not taking: Reported on 11/04/2015 07/22/15   Veryl Speak, FNP  omeprazole (PRILOSEC) 20 MG capsule Take 1 capsule (20 mg total) by mouth daily. Patient not taking: Reported on 11/04/2015 08/19/15   Veryl Speak, FNP   BP 117/76 mmHg  Pulse 104  Temp(Src) 97.8 F (36.6 C) (Oral)  Resp 18  SpO2 98% Physical Exam  Constitutional: He is oriented to person, place, and time. He appears well-developed and well-nourished.  HENT:  Head: Normocephalic and atraumatic.  Right Ear: External ear normal.  Left Ear: External ear normal.  Eyes: Conjunctivae and EOM are normal. Pupils are equal, round, and reactive to light.  Neck: Normal range of motion and phonation normal. Neck supple.  Cardiovascular: Normal rate, regular rhythm and normal heart sounds.   Pulmonary/Chest: Effort normal and breath sounds normal. He exhibits no bony tenderness.  Abdominal: Soft. There is no tenderness.  Musculoskeletal: Normal range of motion.  Neurological: He is alert and oriented to person, place, and time. No cranial nerve deficit or sensory deficit. He exhibits normal muscle tone. Coordination normal.  Skin: Skin is warm, dry and intact.  Psychiatric: He has a normal mood and affect. His behavior is normal. Judgment and thought content normal.  Nursing note and vitals reviewed.   ED Course  Procedures (including critical care time)  Medications  insulin aspart protamine- aspart (NOVOLOG MIX 70/30) injection 40 Units (not administered)  sodium chloride 0.9 % bolus 1,000 mL (0 mLs Intravenous Stopped 12/03/15 1610)  LORazepam (ATIVAN) injection 1 mg (1 mg Intravenous Given 12/03/15 1324)  insulin aspart (novoLOG) injection 15 Units (15 Units Subcutaneous Given 12/03/15 1646)    Patient Vitals for the past 24 hrs:  BP Temp Temp src Pulse Resp SpO2  12/03/15 1549 117/76 mmHg 97.8 F (36.6 C) Oral 104 18 98 %  12/03/15 1234 119/80 mmHg 98.1 F (36.7 C)  Oral 113 20 99 %    7:45 PM Reevaluation with update and discussion. After initial assessment and treatment, an updated evaluation reveals patient states he feels better. CBG has improved. He requested his usual nighttime medications. I informed him that he had previously told me he is not taking them. He now states that he has been taking them, with the exception of his insulin. I ordered his nighttime medicines. Almarosa Bohac L   20:05- patient walked out AMA, prior to receiving medication, without stain for instructions.  Labs Review Labs Reviewed  COMPREHENSIVE METABOLIC PANEL - Abnormal; Notable for the following:    Sodium 129 (*)    Chloride 90 (*)    Glucose, Bld 787 (*)    Total Bilirubin 1.3 (*)    All other components within normal limits  URINALYSIS, ROUTINE W REFLEX MICROSCOPIC (NOT AT [facility]) -  Abnormal; Notable for the following:    Specific Gravity, Urine 1.036 (*)    Glucose, UA >1000 (*)    Ketones, ur 40 (*)    All other components within normal limits  URINE MICROSCOPIC-ADD ON - Abnormal; Notable for the following:    Squamous Epithelial / LPF 0-5 (*)    All other components within normal limits  CBG MONITORING, ED - Abnormal; Notable for the following:    Glucose-Capillary >600 (*)    All other components within normal limits  CBC WITH DIFFERENTIAL/PLATELET  URINE RAPID DRUG SCREEN, HOSP PERFORMED    Imaging Review No results found. I have personally reviewed and evaluated these images and lab results as part of my medical decision-making.   EKG Interpretation None      MDM   Final diagnoses:  Hyperglycemia  Noncompliance with medication regimen    Hyperglycemia due to noncompliance. Patient is apparently not taking his medications, although he states he has been taking some of them.   Nursing Notes Reviewed/ Care Coordinated, and agree without changes. Applicable Imaging Reviewed.  Interpretation of Laboratory Data incorporated into ED  treatment  Patient left AGAINST MEDICAL ADVICE    Mancel Bale, MD 12/03/15 2012

## 2015-12-03 NOTE — ED Notes (Signed)
Patient informed we need a urine sample.

## 2015-12-03 NOTE — Progress Notes (Signed)
Pt came to [facility] ED nursing room requesting food Pt had been noted requesting drink and food in TCU room 30 earlier  ED Nurses informed pt that he could eat after EDP reviewed the results of his labs/imaging with him. Nurses request pt return to his room but he refused and remained at nursing station Cm speaking with [facility] security staff when pt asked if he could get a drink and a sandwich.  Cm informed him he could eat after EDP reviewed the results of his labs/imaging with him. Cm noticed pt reach across the nursing station desk behind a PC. Cm looked up and requested pt "Don't do that" and he pulled his hand back.  There was a bag belonging to a ED CNA behind the [facility].  Pt stated "I already did it. What's going to happen?" Cm informed him that he could not take others personal property (CNA belonging bag nor touch the ED PC)  Pt continued to argue with CM Security was called [employer] and security requested pt return to his room

## 2015-12-03 NOTE — ED Notes (Signed)
Pt seen walking towards ambulance bay door, when ask patient if he was leaving he states he is leaving, pt pulled his own IV from L forearm, no bleeding noted. Pt requesting bus pass, I advised pt the MD had ordered additional medication for him and if he wanted to stay to be d/c we could give him the bus pass. Pt declined. Pt states he wants to be removed from by security d/t the writers un professionalism in refusing to give him additional food,attempts made to explain to patient continually eating graham crackers and sandwiches will only push his BS back up.  Pt refusing to leave charge desk, using loud voice when speaking with [employer] and security, pt walked from department with steady gait.

## 2015-12-03 NOTE — ED Notes (Signed)
Per EMS-[employer] called EMS for a welfare check. EMS states that pt was alert and cooperative and c/o being "chilled" for 2 days.

## 2015-12-03 NOTE — ED Notes (Addendum)
Pt is angry that he was d/c as well as not getting a way home from the ED and is representing d/t dry mouth.  CBG in triage is 203.  Pt is A&O x 4.  In NAD.

## 2015-12-03 NOTE — Discharge Instructions (Signed)
Medicine Refill at the Emergency Department We have refilled your medicine today, but it is best for you to get refills through your primary health care provider's office. In the future, please plan ahead so you do not need to get refills from the emergency department. If the medicine we refilled was a maintenance medicine, you may have received only enough to get you by until you are able to see your regular health care provider.   This information is not intended to replace advice given to you by your health care provider. Make sure you discuss any questions you have with your health care provider.   Follow up with your primary care provider for medication refill and adjustment. Take home insulin as prescribed. Return to the Emergency Department if you experience loss of consciousness, vomiting, fever, difficulty breathing, chest pain.

## 2015-12-04 NOTE — ED Provider Notes (Signed)
CSN: [id number]     Arrival date & time 12/03/15  2014 History   First MD Initiated Contact with Patient 12/03/15 2127     Chief Complaint  Patient presents with  . Dry mouth    Dry mouth     (Consider location/radiation/quality/duration/timing/severity/associated sxs/prior Treatment) HPI   Dayvian Blixt is a 29 y.o M with a pmhx of Dm, schizophrenia, polysubstance abuse, HTN who presents to the ED complaining of feeling cold. Pt also requesting his medications. Pt was seen in the ED less than 1 hour ago for same complaints. Pt states that when he left the ED his feeling of coldness returned when he went outside so he came back to the ED. Denies fever, paresthesias, N/V, CP, SOB, other associated symptoms.   Past Medical History  Diagnosis Date  . Depression   . Schizo affective schizophrenia ([name])   . Polysubstance abuse   . Scoliosis   . Chronic pain   . Noncompliance with medication regimen   . GERD (gastroesophageal reflux disease)   . Arthritis     "both hips" (12/21/2014)  . Asthma     Childhood  . Chicken pox   . Diabetes mellitus     Type 1 diabetes  . DKA (diabetic ketoacidoses) ([name]) 11/18/2014  . Hypertension    Past Surgical History  Procedure Laterality Date  . Hand surgery Left     Left thumb surgery   Family History  Problem Relation Age of Onset  . Diabetes Mother    Social History  Substance Use Topics  . Smoking status: Current Some Day Smoker -- 1.00 packs/day for 7 years    Types: Cigarettes  . Smokeless tobacco: Never Used  . Alcohol Use: No     Comment: 12/21/2014 "I drink on a rare occasion"    Review of Systems  All other systems reviewed and are negative.     Allergies  Sulfa antibiotics  Home Medications   Prior to Admission medications   Medication Sig Start Date End Date Taking? Authorizing Provider  amphetamine-dextroamphetamine (ADDERALL) 20 MG tablet Take 20 mg by mouth 2 (two) times daily.   Yes Historical Provider, MD   insulin NPH-regular Human (NOVOLIN 70/30 RELION) (70-30) 100 UNIT/ML injection Inject 40 Units into the skin 2 (two) times daily with a meal. 11/26/15  Yes Rolly Salter, MD  QUEtiapine (SEROQUEL) 100 MG tablet Take 1 tablet (100 mg total) by mouth 2 (two) times daily. 11/26/15  Yes Rolly Salter, MD  traZODone (DESYREL) 100 MG tablet Take 2 tablets (200 mg total) by mouth at bedtime. 10/27/15  Yes Veryl Speak, FNP  diphenhydrAMINE (BENADRYL) 25 mg capsule Take 1 capsule (25 mg total) by mouth every 8 (eight) hours as needed (anxiety). Patient not taking: Reported on 11/25/2015 11/08/15   Albertine Grates, MD  doxepin (SINEQUAN) 100 MG capsule Take 1 capsule (100 mg total) by mouth at bedtime. Patient not taking: Reported on 11/25/2015 08/19/15   Veryl Speak, FNP  feeding supplement, GLUCERNA SHAKE, (GLUCERNA SHAKE) LIQD Take 237 mLs by mouth 3 (three) times daily between meals. Patient not taking: Reported on 11/25/2015 11/08/15   Albertine Grates, MD  Insulin Syringe-Needle U-100 (SAFETY-GLIDE 0.3CC SYR 29GX1/2) 29G X 1/2" 0.3 ML MISC Use as directed. Patient not taking: Reported on 11/25/2015 06/03/15   Elease Etienne, MD  NAPROSYN 500 MG tablet Take 1 tablet (500 mg total) by mouth 2 (two) times daily with a meal. Patient not taking: Reported on  11/04/2015 07/22/15   Veryl Speak, FNP  omeprazole (PRILOSEC) 20 MG capsule Take 1 capsule (20 mg total) by mouth daily. Patient not taking: Reported on 11/04/2015 08/19/15   Veryl Speak, FNP   BP 116/83 mmHg  Pulse 102  Temp(Src) 98.2 F (36.8 C) (Oral)  Resp 18  Ht 6\' 1"  (1.854 m)  Wt 63.504 kg  BMI 18.47 kg/m2  SpO2 98% Physical Exam  Constitutional: He is oriented to person, place, and time. He appears well-developed and well-nourished. No distress.  HENT:  Head: Normocephalic and atraumatic.  Mouth/Throat: Oropharynx is clear and moist. No oropharyngeal exudate.  Eyes: Conjunctivae and EOM are normal. Pupils are equal, round, and reactive to  light. Right eye exhibits no discharge. Left eye exhibits no discharge. No scleral icterus.  Neck: Neck supple.  Cardiovascular: Normal rate, regular rhythm, normal heart sounds and intact distal pulses.  Exam reveals no gallop and no friction rub.   No murmur heard. Pulmonary/Chest: Effort normal and breath sounds normal. No respiratory distress. He has no wheezes. He has no rales. He exhibits no tenderness.  Abdominal: Soft. He exhibits no distension. There is no tenderness. There is no guarding.  Musculoskeletal: Normal range of motion. He exhibits no edema.  Lymphadenopathy:    He has no cervical adenopathy.  Neurological: He is alert and oriented to person, place, and time. No cranial nerve deficit.  Strength 5/5 throughout. No sensory deficits.  No gait abnormality.   Skin: Skin is warm and dry. No rash noted. He is not diaphoretic. No erythema. No pallor.  Psychiatric: He has a normal mood and affect. His behavior is normal.  Nursing note and vitals reviewed.   ED Course  Procedures (including critical care time) Labs Review Labs Reviewed  CBG MONITORING, ED - Abnormal; Notable for the following:    Glucose-Capillary 203 (*)    All other components within normal limits    Imaging Review No results found. I have personally reviewed and evaluated these images and lab results as part of my medical decision-making.   EKG Interpretation None      MDM   Final diagnoses:  Medication refill    Pt was seen in the ED less than 1 hour prior to this visit for same complaints. His blood sugar at that time was elevated and he was given his home insulin in the ED which normalized his blood sugar. There was no concern for DKA or sepsis. Pt was eating and drinking in the ED. Pt became agitated at past visit because he was being discharged home and left AMA. Pt became more agitated because he did not receive a bus pass bc he left AMA. Pt states that when he left he was still cold to so  return to the ED. Pt now requesting for a dose of his other home medications now because he is out of them and he will not be able to get them filled until tomorrow. Pt temp in ED is 98.2. No concern for hypothermia. No evidence of frost bite. Discussed with pt that we will give him his night time dose of home medications and that he will then be discharged home to follow up with his PCP. Pt is agreeable to this plan. Return precautions outlined in patient discharge instructions.      [place], PA-C 12/04/15 2248  12/06/15, MD 12/05/15 [phone]

## 2015-12-06 ENCOUNTER — Other Ambulatory Visit: Payer: Self-pay | Admitting: Pharmacist

## 2015-12-06 ENCOUNTER — Telehealth: Payer: Self-pay

## 2015-12-06 NOTE — Patient Outreach (Addendum)
[facility]) Care Management  [facility]   12/06/2015  [name] [birth year] [phone]  I was unable to establish contact with patient. Will close to [facility] and close [facility] CM patient case. Will send case closure letter to primary care provider.    Juanita Craver, [name], BCPS Clinical Pharmacist [facility] [phone]

## 2015-12-06 NOTE — Telephone Encounter (Signed)
Pt scheduled an appt with [facility] for 12/07/15 but patient was dismissed 10/27/15. Called patient 12/06/15 and left a voicemail reminding him of his dismissal and that the 12/07/15 appt will not be honored. Recommended he contact [facility] Outreach ( they have been trying to reach him ) to assist in his care and provided their contact info.

## 2015-12-07 ENCOUNTER — Ambulatory Visit: Payer: Self-pay | Admitting: Family

## 2015-12-09 ENCOUNTER — Other Ambulatory Visit: Payer: Self-pay | Admitting: *Deleted

## 2015-12-19 ENCOUNTER — Encounter (HOSPITAL_COMMUNITY): Payer: Self-pay | Admitting: Emergency Medicine

## 2015-12-19 ENCOUNTER — Inpatient Hospital Stay (HOSPITAL_COMMUNITY)
Admission: EM | Admit: 2015-12-19 | Discharge: 2015-12-24 | DRG: 641 | Disposition: A | Discharge status: Other Acute Inpatient Hospital | Payer: PPO | Attending: Internal Medicine | Admitting: Internal Medicine

## 2015-12-19 DIAGNOSIS — Z9119 Patient's noncompliance with other medical treatment and regimen: Secondary | ICD-10-CM | POA: Diagnosis not present

## 2015-12-19 DIAGNOSIS — R632 Polyphagia: Secondary | ICD-10-CM | POA: Diagnosis present

## 2015-12-19 DIAGNOSIS — Z882 Allergy status to sulfonamides status: Secondary | ICD-10-CM

## 2015-12-19 DIAGNOSIS — Z8619 Personal history of other infectious and parasitic diseases: Secondary | ICD-10-CM

## 2015-12-19 DIAGNOSIS — E870 Hyperosmolality and hypernatremia: Principal | ICD-10-CM | POA: Diagnosis present

## 2015-12-19 DIAGNOSIS — I10 Essential (primary) hypertension: Secondary | ICD-10-CM | POA: Diagnosis present

## 2015-12-19 DIAGNOSIS — Z590 Homelessness: Secondary | ICD-10-CM

## 2015-12-19 DIAGNOSIS — M1990 Unspecified osteoarthritis, unspecified site: Secondary | ICD-10-CM | POA: Diagnosis present

## 2015-12-19 DIAGNOSIS — Z794 Long term (current) use of insulin: Secondary | ICD-10-CM

## 2015-12-19 DIAGNOSIS — R451 Restlessness and agitation: Secondary | ICD-10-CM | POA: Diagnosis present

## 2015-12-19 DIAGNOSIS — E860 Dehydration: Secondary | ICD-10-CM | POA: Diagnosis present

## 2015-12-19 DIAGNOSIS — Z79899 Other long term (current) drug therapy: Secondary | ICD-10-CM | POA: Diagnosis not present

## 2015-12-19 DIAGNOSIS — K219 Gastro-esophageal reflux disease without esophagitis: Secondary | ICD-10-CM | POA: Diagnosis present

## 2015-12-19 DIAGNOSIS — Z833 Family history of diabetes mellitus: Secondary | ICD-10-CM

## 2015-12-19 DIAGNOSIS — R45850 Homicidal ideations: Secondary | ICD-10-CM | POA: Diagnosis present

## 2015-12-19 DIAGNOSIS — Z9114 Patient's other noncompliance with medication regimen: Secondary | ICD-10-CM

## 2015-12-19 DIAGNOSIS — M25512 Pain in left shoulder: Secondary | ICD-10-CM | POA: Diagnosis present

## 2015-12-19 DIAGNOSIS — F209 Schizophrenia, unspecified: Secondary | ICD-10-CM

## 2015-12-19 DIAGNOSIS — E871 Hypo-osmolality and hyponatremia: Secondary | ICD-10-CM | POA: Diagnosis not present

## 2015-12-19 DIAGNOSIS — E1065 Type 1 diabetes mellitus with hyperglycemia: Secondary | ICD-10-CM | POA: Diagnosis present

## 2015-12-19 DIAGNOSIS — G8929 Other chronic pain: Secondary | ICD-10-CM | POA: Diagnosis present

## 2015-12-19 DIAGNOSIS — E1069 Type 1 diabetes mellitus with other specified complication: Secondary | ICD-10-CM

## 2015-12-19 DIAGNOSIS — J45909 Unspecified asthma, uncomplicated: Secondary | ICD-10-CM | POA: Diagnosis present

## 2015-12-19 DIAGNOSIS — M419 Scoliosis, unspecified: Secondary | ICD-10-CM | POA: Diagnosis present

## 2015-12-19 DIAGNOSIS — F329 Major depressive disorder, single episode, unspecified: Secondary | ICD-10-CM | POA: Diagnosis present

## 2015-12-19 DIAGNOSIS — F17210 Nicotine dependence, cigarettes, uncomplicated: Secondary | ICD-10-CM | POA: Diagnosis present

## 2015-12-19 DIAGNOSIS — F411 Generalized anxiety disorder: Secondary | ICD-10-CM | POA: Diagnosis present

## 2015-12-19 DIAGNOSIS — F203 Undifferentiated schizophrenia: Secondary | ICD-10-CM | POA: Diagnosis not present

## 2015-12-19 DIAGNOSIS — F259 Schizoaffective disorder, unspecified: Secondary | ICD-10-CM

## 2015-12-19 DIAGNOSIS — R443 Hallucinations, unspecified: Secondary | ICD-10-CM

## 2015-12-19 DIAGNOSIS — F1290 Cannabis use, unspecified, uncomplicated: Secondary | ICD-10-CM | POA: Diagnosis present

## 2015-12-19 DIAGNOSIS — F250 Schizoaffective disorder, bipolar type: Secondary | ICD-10-CM | POA: Diagnosis present

## 2015-12-19 DIAGNOSIS — R739 Hyperglycemia, unspecified: Secondary | ICD-10-CM | POA: Diagnosis present

## 2015-12-19 DIAGNOSIS — F22 Delusional disorders: Secondary | ICD-10-CM | POA: Diagnosis present

## 2015-12-19 LAB — BASIC METABOLIC PANEL
Anion gap: 12 (ref 5–15)
BUN: 15 mg/dL (ref 6–20)
CALCIUM: 9.2 mg/dL (ref 8.9–10.3)
CO2: 27 mmol/L (ref 22–32)
CREATININE: 1.15 mg/dL (ref 0.61–1.24)
Chloride: 90 mmol/L — ABNORMAL LOW (ref 101–111)
GFR calc Af Amer: >60 mL/min (ref >60)
GLUCOSE: 894 mg/dL — AB (ref 65–99)
POTASSIUM: 4.8 mmol/L (ref 3.5–5.1)
SODIUM: 129 mmol/L — AB (ref 135–145)

## 2015-12-19 LAB — I-STAT CHEM 8, ED
BUN: 16 mg/dL (ref 6–20)
CALCIUM ION: 1.16 mmol/L (ref 1.12–1.23)
CREATININE: 1 mg/dL (ref 0.61–1.24)
Chloride: 90 mmol/L — ABNORMAL LOW (ref 101–111)
HCT: 47 % (ref 39.0–52.0)
HEMOGLOBIN: 16 g/dL (ref 13.0–17.0)
Potassium: 4.8 mmol/L (ref 3.5–5.1)
Sodium: 129 mmol/L — ABNORMAL LOW (ref 135–145)
TCO2: 26 mmol/L (ref 0–100)

## 2015-12-19 LAB — BLOOD GAS, VENOUS
ACID-BASE DEFICIT: 0.6 mmol/L (ref 0.0–2.0)
Bicarbonate: 24.4 mEq/L — ABNORMAL HIGH (ref 20.0–24.0)
FIO2: 0.21
O2 SAT: 82.6 %
PATIENT TEMPERATURE: 98.6
PCO2 VEN: 44.1 mmHg — AB (ref 45.0–50.0)
TCO2: 21.7 mmol/L (ref 0–100)
pH, Ven: 7.363 — ABNORMAL HIGH (ref 7.250–7.300)
pO2, Ven: 46.6 mmHg — ABNORMAL HIGH (ref 30.0–45.0)

## 2015-12-19 LAB — CBC
HEMATOCRIT: 42.1 % (ref 39.0–52.0)
Hemoglobin: 14.4 g/dL (ref 13.0–17.0)
MCH: 30.7 pg (ref 26.0–34.0)
MCHC: 34.2 g/dL (ref 30.0–36.0)
MCV: 89.8 fL (ref 78.0–100.0)
PLATELETS: 218 10*3/uL (ref 150–400)
RBC: 4.69 MIL/uL (ref 4.22–5.81)
RDW: 12.3 % (ref 11.5–15.5)
WBC: 7.3 10*3/uL (ref 4.0–10.5)

## 2015-12-19 LAB — GLUCOSE, CAPILLARY
GLUCOSE-CAPILLARY: 95 mg/dL (ref 65–99)
Glucose-Capillary: 142 mg/dL — ABNORMAL HIGH (ref 65–99)
Glucose-Capillary: 238 mg/dL — ABNORMAL HIGH (ref 65–99)
Glucose-Capillary: 110 mg/dL — ABNORMAL HIGH (ref 65–99)

## 2015-12-19 LAB — URINALYSIS, ROUTINE W REFLEX MICROSCOPIC
BILIRUBIN URINE: NEGATIVE
Glucose, UA: >1000 mg/dL — AB
Hgb urine dipstick: NEGATIVE
KETONES UR: 15 mg/dL — AB
LEUKOCYTES UA: NEGATIVE
NITRITE: NEGATIVE
PROTEIN: NEGATIVE mg/dL
Specific Gravity, Urine: 1.031 — ABNORMAL HIGH (ref 1.005–1.030)
pH: 5.5 (ref 5.0–8.0)

## 2015-12-19 LAB — RAPID URINE DRUG SCREEN, HOSP PERFORMED
Amphetamines: NOT DETECTED
BARBITURATES: NOT DETECTED
BENZODIAZEPINES: NOT DETECTED
Cocaine: NOT DETECTED
Opiates: NOT DETECTED
Tetrahydrocannabinol: NOT DETECTED

## 2015-12-19 LAB — ETHANOL

## 2015-12-19 LAB — URINE MICROSCOPIC-ADD ON
Bacteria, UA: NONE SEEN
RBC / HPF: NONE SEEN RBC/hpf (ref 0–5)
WBC, UA: NONE SEEN WBC/hpf (ref 0–5)

## 2015-12-19 LAB — ACETAMINOPHEN LEVEL

## 2015-12-19 LAB — CBG MONITORING, ED: Glucose-Capillary: 511 mg/dL — ABNORMAL HIGH (ref 65–99)

## 2015-12-19 LAB — SALICYLATE LEVEL

## 2015-12-19 MED ORDER — DIPHENHYDRAMINE HCL 50 MG/ML IJ SOLN: 12.5000 mg | Freq: Three times a day (TID) | INTRAMUSCULAR | Status: DC | PRN

## 2015-12-19 MED ORDER — STERILE WATER FOR INJECTION IJ SOLN
INTRAMUSCULAR | Status: AC
  Administered 2015-12-19: 10 mL
  Filled 2015-12-19: qty 10

## 2015-12-19 MED ORDER — LORAZEPAM 2 MG/ML IJ SOLN: 1.0000 mg | Freq: Four times a day (QID) | INTRAMUSCULAR | Status: DC | PRN

## 2015-12-19 MED ORDER — LORAZEPAM 1 MG PO TABS
1.0000 mg | ORAL_TABLET | Freq: Three times a day (TID) | ORAL | Status: DC | PRN
  Administered 2015-12-19 – 2015-12-24 (×10): 1 mg via ORAL
  Filled 2015-12-19 (×10): qty 1

## 2015-12-19 MED ORDER — ALBUTEROL SULFATE (2.5 MG/3ML) 0.083% IN NEBU: 2.5000 mg | INHALATION_SOLUTION | RESPIRATORY_TRACT | Status: DC | PRN

## 2015-12-19 MED ORDER — DEXTROSE-NACL 5-0.45 % IV SOLN
INTRAVENOUS | Status: DC
  Administered 2015-12-19: 18:00:00 via INTRAVENOUS

## 2015-12-19 MED ORDER — ENOXAPARIN SODIUM 40 MG/0.4ML ~~LOC~~ SOLN
40.0000 mg | SUBCUTANEOUS | Status: DC
  Administered 2015-12-19 – 2015-12-23 (×5): 40 mg via SUBCUTANEOUS
  Filled 2015-12-19 (×6): qty 0.4

## 2015-12-19 MED ORDER — SODIUM CHLORIDE 0.9 % IV SOLN: INTRAVENOUS | Status: DC

## 2015-12-19 MED ORDER — INSULIN ASPART 100 UNIT/ML ~~LOC~~ SOLN
0.0000 [IU] | Freq: Every day | SUBCUTANEOUS | Status: DC
  Administered 2015-12-20: 4 [IU] via SUBCUTANEOUS

## 2015-12-19 MED ORDER — INSULIN ASPART 100 UNIT/ML ~~LOC~~ SOLN: 3.0000 [IU] | Freq: Three times a day (TID) | SUBCUTANEOUS | Status: DC

## 2015-12-19 MED ORDER — INSULIN ASPART 100 UNIT/ML ~~LOC~~ SOLN
0.0000 [IU] | Freq: Three times a day (TID) | SUBCUTANEOUS | Status: DC
  Administered 2015-12-20: 11 [IU] via SUBCUTANEOUS
  Administered 2015-12-20: 3 [IU] via SUBCUTANEOUS
  Administered 2015-12-21: 8 [IU] via SUBCUTANEOUS
  Administered 2015-12-21: 11 [IU] via SUBCUTANEOUS
  Administered 2015-12-21: 5 [IU] via SUBCUTANEOUS

## 2015-12-19 MED ORDER — HALOPERIDOL LACTATE 5 MG/ML IJ SOLN
5.0000 mg | Freq: Three times a day (TID) | INTRAMUSCULAR | Status: DC | PRN
Start: 2015-12-19 — End: 2015-12-24
  Administered 2015-12-20: 5 mg via INTRAMUSCULAR
  Filled 2015-12-19: qty 1

## 2015-12-19 MED ORDER — LORAZEPAM 2 MG/ML IJ SOLN: 1.0000 mg | Freq: Three times a day (TID) | INTRAMUSCULAR | Status: DC | PRN

## 2015-12-19 MED ORDER — ACETAMINOPHEN 325 MG PO TABS
650.0000 mg | ORAL_TABLET | Freq: Four times a day (QID) | ORAL | Status: DC | PRN
  Administered 2015-12-20 – 2015-12-22 (×4): 650 mg via ORAL
  Filled 2015-12-19 (×5): qty 2

## 2015-12-19 MED ORDER — TRAZODONE HCL 50 MG PO TABS
50.0000 mg | ORAL_TABLET | Freq: Every day | ORAL | Status: DC
  Administered 2015-12-19 – 2015-12-21 (×3): 50 mg via ORAL
  Filled 2015-12-19 (×4): qty 1

## 2015-12-19 MED ORDER — DEXTROSE 50 % IV SOLN: 25.0000 mL | INTRAVENOUS | Status: DC | PRN

## 2015-12-19 MED ORDER — SODIUM CHLORIDE 0.9 % IV SOLN
1000.0000 mL | INTRAVENOUS | Status: DC
  Administered 2015-12-19: 1000 mL via INTRAVENOUS

## 2015-12-19 MED ORDER — INSULIN GLARGINE 100 UNIT/ML ~~LOC~~ SOLN
10.0000 [IU] | Freq: Every day | SUBCUTANEOUS | Status: DC
  Administered 2015-12-19: 10 [IU] via SUBCUTANEOUS
  Filled 2015-12-19: qty 0.1

## 2015-12-19 MED ORDER — SODIUM CHLORIDE 0.9 % IV SOLN
INTRAVENOUS | Status: DC
  Administered 2015-12-20: 10:00:00 via INTRAVENOUS

## 2015-12-19 MED ORDER — INSULIN REGULAR BOLUS VIA INFUSION
0.0000 [IU] | Freq: Three times a day (TID) | INTRAVENOUS | Status: DC
  Filled 2015-12-19: qty 10

## 2015-12-19 MED ORDER — ACETAMINOPHEN 650 MG RE SUPP: 650.0000 mg | Freq: Four times a day (QID) | RECTAL | Status: DC | PRN

## 2015-12-19 MED ORDER — ARIPIPRAZOLE 5 MG PO TABS
5.0000 mg | ORAL_TABLET | Freq: Every day | ORAL | Status: DC
  Administered 2015-12-20 – 2015-12-22 (×3): 5 mg via ORAL
  Filled 2015-12-19 (×3): qty 1

## 2015-12-19 MED ORDER — SODIUM CHLORIDE 0.9 % IV SOLN
INTRAVENOUS | Status: DC
  Administered 2015-12-19: 4.5 [IU]/h via INTRAVENOUS
  Filled 2015-12-19: qty 2.5

## 2015-12-19 MED ORDER — SODIUM CHLORIDE 0.9 % IV SOLN
1000.0000 mL | Freq: Once | INTRAVENOUS | Status: AC
  Administered 2015-12-19: 1000 mL via INTRAVENOUS

## 2015-12-19 MED ORDER — LORAZEPAM 2 MG/ML IJ SOLN
1.0000 mg | Freq: Once | INTRAMUSCULAR | Status: AC
  Administered 2015-12-19: 1 mg via INTRAVENOUS
  Filled 2015-12-19: qty 1

## 2015-12-19 MED ORDER — ZIPRASIDONE MESYLATE 20 MG IM SOLR
20.0000 mg | Freq: Once | INTRAMUSCULAR | Status: AC
  Administered 2015-12-19: 20 mg via INTRAMUSCULAR
  Filled 2015-12-19: qty 20

## 2015-12-19 NOTE — ED Notes (Signed)
Pt's belongings included:  Tan Shoes Brown Dickies Socks Tan jumpsuit w/ red lining ([place]) Blue/Green plaid fleece pajama pants  All were placed in a pt belongings bag and left at the RN station across from RM 25 d/t no open lockers in [place].

## 2015-12-19 NOTE — ED Notes (Signed)
Per EMS, pt is homeless from [facility] and is c/o hallucinations and HI. Pt has psychiatric hx of schizophrenia and admits to not taking his meds for at least a month. Pt was cooperative in transport. CBG in transport was reading "high" on the monitor.

## 2015-12-19 NOTE — ED Provider Notes (Signed)
CSN: [id number]     Arrival date & time 12/19/15  1155 History   First MD Initiated Contact with Patient 12/19/15 1232     Chief Complaint  Patient presents with  . Hyperglycemia  . Hallucinations   HPI Pt has been having trouble with his schizophrenia.  Pt thinks a lot of other people are causing him trouble.  Other people are pretending to be him and are acting against him.  His has dream people telling that he needs to be in the hospital.  He does not want to hurt himself of anyone else but he knows things are getting worse and he needs to be detained in a psychiatric hospital.  He feels like his medications need to be corrected.   Past Medical History  Diagnosis Date  . Depression   . Schizo affective schizophrenia ([name])   . Polysubstance abuse   . Scoliosis   . Chronic pain   . Noncompliance with medication regimen   . GERD (gastroesophageal reflux disease)   . Arthritis     "both hips" (12/21/2014)  . Asthma     Childhood  . Chicken pox   . Diabetes mellitus     Type 1 diabetes  . DKA (diabetic ketoacidoses) ([name]) 11/18/2014  . Hypertension    Past Surgical History  Procedure Laterality Date  . Hand surgery Left     Left thumb surgery   Family History  Problem Relation Age of Onset  . Diabetes Mother    Social History  Substance Use Topics  . Smoking status: Current Every Day Smoker -- 1.00 packs/day for 7 years    Types: Cigarettes  . Smokeless tobacco: Never Used  . Alcohol Use: No     Comment: 12/21/2014 "I drink on a rare occasion"    Review of Systems  Constitutional: Negative for fever.  Gastrointestinal: Positive for vomiting.  All other systems reviewed and are negative.     Allergies  Sulfa antibiotics  Home Medications   Prior to Admission medications   Medication Sig Start Date End Date Taking? Authorizing Provider  amphetamine-dextroamphetamine (ADDERALL) 20 MG tablet Take 40 mg by mouth 2 (two) times daily.    Yes Historical Provider, MD   insulin NPH-regular Human (NOVOLIN 70/30 RELION) (70-30) 100 UNIT/ML injection Inject 40 Units into the skin 2 (two) times daily with a meal. 11/26/15  Yes Rolly Salter, MD  QUEtiapine (SEROQUEL) 100 MG tablet Take 1 tablet (100 mg total) by mouth 2 (two) times daily. 11/26/15  Yes Rolly Salter, MD  traZODone (DESYREL) 100 MG tablet Take 2 tablets (200 mg total) by mouth at bedtime. 10/27/15  Yes Veryl Speak, FNP  doxepin (SINEQUAN) 100 MG capsule Take 1 capsule (100 mg total) by mouth at bedtime. Patient not taking: Reported on 11/25/2015 08/19/15   Veryl Speak, FNP  feeding supplement, GLUCERNA SHAKE, (GLUCERNA SHAKE) LIQD Take 237 mLs by mouth 3 (three) times daily between meals. Patient not taking: Reported on 11/25/2015 11/08/15   Albertine Grates, MD  Insulin Syringe-Needle U-100 (SAFETY-GLIDE 0.3CC SYR 29GX1/2) 29G X 1/2" 0.3 ML MISC Use as directed. Patient not taking: Reported on 11/25/2015 06/03/15   Elease Etienne, MD  NAPROSYN 500 MG tablet Take 1 tablet (500 mg total) by mouth 2 (two) times daily with a meal. Patient not taking: Reported on 11/04/2015 07/22/15   Veryl Speak, FNP   BP 123/87 mmHg  Pulse 107  Temp(Src) 97.8 F (36.6 C) (Oral)  Resp  16  SpO2 99% Physical Exam  Constitutional: No distress.  HENT:  Head: Normocephalic and atraumatic.  Right Ear: External ear normal.  Left Ear: External ear normal.  Eyes: Conjunctivae are normal. Right eye exhibits no discharge. Left eye exhibits no discharge. No scleral icterus.  Neck: Neck supple. No tracheal deviation present.  Cardiovascular: Normal rate, regular rhythm and intact distal pulses.   Pulmonary/Chest: Effort normal and breath sounds normal. No stridor. No respiratory distress. He has no wheezes. He has no rales.  Abdominal: Soft. Bowel sounds are normal. He exhibits no distension. There is no tenderness. There is no rebound and no guarding.  Musculoskeletal: He exhibits no edema or tenderness.  Neurological:  He is alert. He has normal strength. No cranial nerve deficit (no facial droop, extraocular movements intact, no slurred speech) or sensory deficit. He exhibits normal muscle tone. He displays no seizure activity. Coordination normal.  Skin: Skin is warm and dry. No rash noted.  Psychiatric: His affect is labile. His speech is rapid and/or pressured and tangential. He is hyperactive. He is not aggressive and not combative. He expresses no homicidal and no suicidal ideation.  Nursing note and vitals reviewed.   ED Course  Procedures (including critical care time) Labs Review Labs Reviewed  BASIC METABOLIC PANEL - Abnormal; Notable for the following:    Sodium 129 (*)    Chloride 90 (*)    Glucose, Bld 894 (*)    All other components within normal limits  URINALYSIS, ROUTINE W REFLEX MICROSCOPIC (NOT AT [facility]) - Abnormal; Notable for the following:    Color, Urine STRAW (*)    Specific Gravity, Urine 1.031 (*)    Glucose, UA >1000 (*)    Ketones, ur 15 (*)    All other components within normal limits  ACETAMINOPHEN LEVEL - Abnormal; Notable for the following:    Acetaminophen (Tylenol), Serum <10 (*)    All other components within normal limits  BLOOD GAS, VENOUS - Abnormal; Notable for the following:    pH, Ven 7.363 (*)    pCO2, Ven 44.1 (*)    pO2, Ven 46.6 (*)    Bicarbonate 24.4 (*)    All other components within normal limits  URINE MICROSCOPIC-ADD ON - Abnormal; Notable for the following:    Squamous Epithelial / LPF 0-5 (*)    All other components within normal limits  CBG MONITORING, ED - Abnormal; Notable for the following:    Glucose-Capillary >600 (*)    All other components within normal limits  I-STAT CHEM 8, ED - Abnormal; Notable for the following:    Sodium 129 (*)    Chloride 90 (*)    Glucose, Bld >700 (*)    All other components within normal limits  CBG MONITORING, ED - Abnormal; Notable for the following:    Glucose-Capillary 511 (*)    All other  components within normal limits  CBC  ETHANOL  SALICYLATE LEVEL  URINE RAPID DRUG SCREEN, HOSP PERFORMED   Medications  0.9 %  sodium chloride infusion (0 mLs Intravenous Stopped 12/19/15 1420)    Followed by  0.9 %  sodium chloride infusion (0 mLs Intravenous Stopped 12/19/15 1532)    Followed by  0.9 %  sodium chloride infusion (1,000 mLs Intravenous New Bag/Given 12/19/15 1532)  insulin regular (NOVOLIN R,HUMULIN R) 250 Units in sodium chloride 0.9 % 250 mL (1 Units/mL) infusion (not administered)  ziprasidone (GEODON) injection 20 mg (20 mg Intramuscular Given 12/19/15 1532)  sterile water (  preservative free) injection (10 mLs  Given 12/19/15 1532)    MDM   Final diagnoses:  Hyperglycemia  Hallucinations  Schizoaffective disorder, unspecified type ([name])   Pt has acute hyperglycemia without DKA.  Pt has not been compliant with his medications.  Started on IV fluids.   Start insulin drip.   While in the ED became agitated and required Geodon.  IVC paperwork filled out.  Will admit to medical service until stabilized for psychiatric evaluation.    Linwood Dibbles, MD 12/19/15 [phone]

## 2015-12-19 NOTE — ED Notes (Signed)
Pt became agitated and starting yelling at staff & another pt in the hallway. Security called to bedside.

## 2015-12-19 NOTE — H&P (Signed)
History and Physical  [name] MHD:[id number] DOB: 24-Feb-1986 DOA: 12/19/2015  Referring physician: Dr. Linwood Dibbles PCP: Jeanine Luz, FNP  Outpatient Specialists:  1. Not known  Chief Complaint: Hallucinations and homicidal ideations  HPI: [name] is a 30 y.o. male , homeless from [employer], PMH of type I DM, schizophrenia and substance abuse [facility]), brought to the [facility] ED by EMS for complaints of hallucinations and homicidal ideations. Patient admitted to the ED staff that he has not taken his medications for at least a month. CBG in transport was reading "high" on the monitor. EDP evaluated patient and patient thought that a lot of other people are causing him trouble, other people up pertaining to be him and are acting against him, he has dreams people telling him that he needs to be in the hospital. He stated to the EDP that he did not want to hurt himself or anyone else but he knew that things were getting worse and he needed to be detained in a psychiatric hospital. Workup in the ED revealed sodium 129, blood glucose 894. He was aggressively hydrated with IV fluids and CBG came down to 511. Patient is being started on an insulin drip. He became agitated in the ED and required Geodon 20 mg IM. EDP filled out IVC paperwork. Hospitalist admission was requested. When this MD went to patient's room to evaluate him, patient was not cooperative. He was paranoid and did not want to be interviewed or examined by me and insisted that I leave his room. I went back to the patient's room with the EDP who explained need for hospitalization and cooperation with me for medical management despite which patient refused. Level V caveat.   Review of Systems: All systems reviewed and apart from history of presenting illness, are negative.  Past Medical History  Diagnosis Date  . Depression   . Schizo affective schizophrenia ([name])   . Polysubstance abuse   . Scoliosis    . Chronic pain   . Noncompliance with medication regimen   . GERD (gastroesophageal reflux disease)   . Arthritis     "both hips" (12/21/2014)  . Asthma     Childhood  . Chicken pox   . Diabetes mellitus     Type 1 diabetes  . DKA (diabetic ketoacidoses) ([name]) 11/18/2014  . Hypertension    Past Surgical History  Procedure Laterality Date  . Hand surgery Left     Left thumb surgery   Social History:  reports that he has been smoking Cigarettes.  He has a 7 pack-year smoking history. He has never used smokeless tobacco. He reports that he uses illicit drugs (Marijuana and Cocaine). He reports that he does not drink alcohol.   Allergies  Allergen Reactions  . Sulfa Antibiotics Other (See Comments)    Unknown - Childhood    Family History  Problem Relation Age of Onset  . Diabetes Mother     Prior to Admission medications   Medication Sig Start Date End Date Taking? Authorizing Provider  amphetamine-dextroamphetamine (ADDERALL) 20 MG tablet Take 40 mg by mouth 2 (two) times daily.    Yes Historical Provider, MD  insulin NPH-regular Human (NOVOLIN 70/30 RELION) (70-30) 100 UNIT/ML injection Inject 40 Units into the skin 2 (two) times daily with a meal. 11/26/15  Yes Rolly Salter, MD  QUEtiapine (SEROQUEL) 100 MG tablet Take 1 tablet (100 mg total) by mouth 2 (two) times daily. 11/26/15  Yes Pranav  Carolanne Grumbling, MD  traZODone (DESYREL) 100 MG tablet Take 2 tablets (200 mg total) by mouth at bedtime. 10/27/15  Yes Veryl Speak, FNP   Physical Exam: Filed Vitals:   12/19/15 1233 12/19/15 1328 12/19/15 1417 12/19/15 1544  BP: 136/95 124/96 135/109 123/87  Pulse: 95 102 105 107  Temp: 97.8 F (36.6 C)     TempSrc: Oral     Resp: 18 18 16 16   SpO2: 100% 100% 100% 99%   Patient uncooperative with physical exam. Moderately built and nourished lying comfortably supine on the gurney. Does not appear in any distress. Appears slightly agitated. Oriented at least to self and place. No  obvious focal deficits observed.  Labs on Admission:  Basic Metabolic Panel:  Recent Labs Lab 12/19/15 1341 12/19/15 1350  NA 129* 129*  K 4.8 4.8  CL 90* 90*  CO2 27  --   GLUCOSE 894* >700*  BUN 15 16  CREATININE 1.15 1.00  CALCIUM 9.2  --    Liver Function Tests: No results for input(s): AST, ALT, ALKPHOS, BILITOT, PROT, ALBUMIN in the last 168 hours. No results for input(s): LIPASE, AMYLASE in the last 168 hours. No results for input(s): AMMONIA in the last 168 hours. CBC:  Recent Labs Lab 12/19/15 1341 12/19/15 1350  WBC 7.3  --   HGB 14.4 16.0  HCT 42.1 47.0  MCV 89.8  --   PLT 218  --    Cardiac Enzymes: No results for input(s): CKTOTAL, CKMB, CKMBINDEX, TROPONINI in the last 168 hours.  BNP (last 3 results) No results for input(s): PROBNP in the last 8760 hours. CBG:  Recent Labs Lab 12/19/15 1216 12/19/15 1541  GLUCAP >600* 511*    Radiological Exams on Admission: No results found.  EKG: No EKG seen in Epic for current visit. EKG 11/25/15 showed QTC of 451 ms.  Assessment/Plan Principal Problem:   DM hyperosmolarity type I, uncontrolled ([name]) Active Problems:   Hyponatremia   Dehydration   Schizophrenia ([name])   Type I diabetes mellitus with hyperosmolarity ([name])   Poorly controlled type I DM with hyperosmolarity - Initial blood glucose 894. No DKA (venous pH 7.363, serum bicarbonate 27 and anion gap 12) - Secondary to medication/insulin noncompliance due to psychiatric issues - Aggressively hydrated with 2 L of normal saline in the ED and CBGs down to 511. Start insulin drip per glucose stabilizer. After CBGs are in reasonable range for glucose stabilizer protocol, transition to Lantus and SSI. - As long as his underlying psychiatric ailment is not stabilized, this is likely to be a recurring problem. -  Hemoglobin A1c on 11/05/15:14.6  Dehydration - IV fluids  Hyponatremia - Secondary to dehydration and hyperglycemia  (pseudohyponatremia) - Treat as above and follow BMP in a.m.  Schizophrenia  - Patient is status post Geodon 20 mg IM 1 and Ativan 1 mg IV 1 in ED - He has been involuntarily committed by EDP and has 1:1 11/07/15. - Psychiatric consulted. Discussed with Dr. Recruitment consultant who will evaluate him in ED and make necessary recommendations.  Substance abuse/THC    DVT prophylaxis: Lovenox Code Status: Full-presumed  Family Communication: None at bedside.  Disposition Plan: To be determined.   Time spent: 45 minutes.  Jama Flavors, MD, FACP, FHM. [facility] Pager [phone]  If 7PM-7AM, please contact night-coverage [email or web address] Password [facility] 12/19/2015, 4:41 PM

## 2015-12-19 NOTE — ED Notes (Signed)
RN Britt Boozer starting IV obtaining labs

## 2015-12-19 NOTE — Consult Note (Signed)
[facility] Consult   Reason for Consult:  Agitation, anxiety, medication non compliant. Referring Physician:  EDP/ HOSPITALIST Patient Identification: [name] MRN:  [id number] Principal Diagnosis: Schizoaffective disorder, bipolar type [facility]) Diagnosis:   Patient Active Problem List   Diagnosis Date Noted  . Schizoaffective disorder, bipolar type ([place]) [F25.0]     Priority: High  . DM hyperosmolarity type I, uncontrolled ([place]) [E10.69, E10.65] 12/19/2015  . Type I diabetes mellitus with hyperosmolarity ([name]) [E10.69, E10.65] 12/19/2015  . Schizoaffective disorder ([place]) [F25.9]   . Left shoulder pain [M25.512] 08/19/2015  . Generalized headache [R51] 07/22/2015  . Bilateral hip bursitis [M70.71, M70.72] 05/26/2015  . Depression [F32.9]   . Schizoaffective disorder, unspecified type ([place]) [F25.9] 03/29/2015  . Generalized anxiety disorder [F41.1] 03/29/2015  . Undifferentiated schizophrenia ([place]) [F20.3]   . Schizophrenia ([place]) [F20.9] 03/20/2015  . Hip pain, bilateral [M25.551, M25.552] 11/18/2014  . Hyponatremia [E87.1] 06/17/2012  . Dehydration [E86.0] 06/17/2012    Total Time spent with patient: 1 hour  Subjective:   [name] is a 30 y.o. male patient admitted with Agitation, Anxiety, medication non compliant.  HPI:  Caucasian male, 30 years old was evaluated for agitation and anxiety.  Patient is well known to the service and comes in with elevated blood glucose.  Patient has a hx of schizoaffective disorder, Bipolar type, undifferentiated Schizophrenia  and Generalized anxiety disorder.  Patient reports this evening that he has not been taking both his insulin for two weeks since he had none to take.  He is not sure he is taking his MH medications as prescribed.  He does not have an outpatient provider and does not see any Psychiatrist or counselor in the community.  Patient admitted to using illicit drugs but would not say what and what  quantity.  Patient stated"I  Do not want to talk about my drug use now"   He looks thin and emaciated and stated that Seroquel helps him sleep.  He is taking Adderall but could not say what he is taking this for and who prescribes it.  He denies SI/HI/AVH.  He meets criteria for inpatient hospitalization for safety and stabilization.  Based on his blood sugar and medical needs, patient will be admitted to the medical unit and Psychiatry will be seeing him daily.  Dr Parke Poisson and this writer are in agreement with this plan of care and have advised Dr Algis Liming, hospitalist our recommendations.  Past Psychiatric History: Generalized Anxiety  Disorder, Undifferentiated Schizophrenia, Schizoaffective disorder.  Risk to Self: Is patient at risk for suicide?: No, but patient needs Medical Clearance Risk to Others:   Prior Inpatient Therapy:   Prior Outpatient Therapy:    Past Medical History:  Past Medical History  Diagnosis Date  . Depression   . Schizo affective schizophrenia ([place])   . Polysubstance abuse   . Scoliosis   . Chronic pain   . Noncompliance with medication regimen   . GERD (gastroesophageal reflux disease)   . Arthritis     "both hips" (12/21/2014)  . Asthma     Childhood  . Chicken pox   . Diabetes mellitus     Type 1 diabetes  . DKA (diabetic ketoacidoses) ([place]) 11/18/2014  . Hypertension     Past Surgical History  Procedure Laterality Date  . Hand surgery Left     Left thumb surgery   Family History:  Family History  Problem Relation Age of Onset  . Diabetes Mother    Family  Psychiatric  History:  unknown Social History:  History  Alcohol Use No    Comment: 12/21/2014 "I drink on a rare occasion"     History  Drug Use  . Yes  . Special: Marijuana, Cocaine    Comment: crack cocaine    Social History   Social History  . Marital Status: Legally Separated    Spouse Name: N/A  . Number of Children: 0  . Years of Education: 13   Occupational History  .  Disability    Social History Main Topics  . Smoking status: Current Every Day Smoker -- 1.00 packs/day for 7 years    Types: Cigarettes  . Smokeless tobacco: Never Used  . Alcohol Use: No     Comment: 12/21/2014 "I drink on a rare occasion"  . Drug Use: Yes    Special: Marijuana, Cocaine     Comment: crack cocaine  . Sexual Activity: Yes   Other Topics Concern  . None   Social History Narrative   ** Merged History Encounter **    Fun: Video games      Additional Social History:     Allergies:   Allergies  Allergen Reactions  . Sulfa Antibiotics Other (See Comments)    Unknown - Childhood    Labs:  Results for orders placed or performed during the hospital encounter of 12/19/15 (from the past 48 hour(s))  CBG monitoring, ED     Status: Abnormal   Collection Time: 12/19/15 12:16 PM  Result Value Ref Range   Glucose-Capillary >600 (HH) 65 - 99 mg/dL  Urinalysis, Routine w reflex microscopic (not at [facility])     Status: Abnormal   Collection Time: 12/19/15 12:37 PM  Result Value Ref Range   Color, Urine STRAW (A) YELLOW   APPearance CLEAR CLEAR   Specific Gravity, Urine 1.031 (H) 1.005 - 1.030   pH 5.5 5.0 - 8.0   Glucose, UA >1000 (A) NEGATIVE mg/dL   Hgb urine dipstick NEGATIVE NEGATIVE   Bilirubin Urine NEGATIVE NEGATIVE   Ketones, ur 15 (A) NEGATIVE mg/dL   Protein, ur NEGATIVE NEGATIVE mg/dL   Nitrite NEGATIVE NEGATIVE   Leukocytes, UA NEGATIVE NEGATIVE  Urine rapid drug screen (hosp performed) (Not at [facility])     Status: None   Collection Time: 12/19/15 12:37 PM  Result Value Ref Range   Opiates NONE DETECTED NONE DETECTED   Cocaine NONE DETECTED NONE DETECTED   Benzodiazepines NONE DETECTED NONE DETECTED   Amphetamines NONE DETECTED NONE DETECTED   Tetrahydrocannabinol NONE DETECTED NONE DETECTED   Barbiturates NONE DETECTED NONE DETECTED    Comment:        DRUG SCREEN FOR MEDICAL PURPOSES ONLY.  IF CONFIRMATION IS NEEDED FOR ANY PURPOSE, NOTIFY LAB WITHIN  5 DAYS.        LOWEST DETECTABLE LIMITS FOR URINE DRUG SCREEN Drug Class       Cutoff (ng/mL) Amphetamine      1000 Barbiturate      200 Benzodiazepine   200 Tricyclics       300 Opiates          300 Cocaine          300 THC              50   Urine microscopic-add on     Status: Abnormal   Collection Time: 12/19/15 12:37 PM  Result Value Ref Range   Squamous Epithelial / LPF 0-5 (A) NONE SEEN   WBC, UA NONE SEEN  0 - 5 WBC/hpf   RBC / HPF NONE SEEN 0 - 5 RBC/hpf   Bacteria, UA NONE SEEN NONE SEEN  Basic metabolic panel     Status: Abnormal   Collection Time: 12/19/15  1:41 PM  Result Value Ref Range   Sodium 129 (L) 135 - 145 mmol/L   Potassium 4.8 3.5 - 5.1 mmol/L   Chloride 90 (L) 101 - 111 mmol/L   CO2 27 22 - 32 mmol/L   Glucose, Bld 894 (HH) 65 - 99 mg/dL    Comment: CRITICAL RESULT CALLED TO, READ BACK BY AND VERIFIED WITH: MAYHALL,J. RN AT 1443 12/19/15 MULLINS,T    BUN 15 6 - 20 mg/dL   Creatinine, Ser 1.15 0.61 - 1.24 mg/dL   Calcium 9.2 8.9 - 10.3 mg/dL   GFR calc non Af Amer >60 >60 mL/min   GFR calc Af Amer >60 >60 mL/min    Comment: (NOTE) The eGFR has been calculated using the CKD EPI equation. This calculation has not been validated in all clinical situations. eGFR's persistently <60 mL/min signify possible Chronic Kidney Disease.    Anion gap 12 5 - 15  CBC     Status: None   Collection Time: 12/19/15  1:41 PM  Result Value Ref Range   WBC 7.3 4.0 - 10.5 K/uL   RBC 4.69 4.22 - 5.81 MIL/uL   Hemoglobin 14.4 13.0 - 17.0 g/dL   HCT 42.1 39.0 - 52.0 %   MCV 89.8 78.0 - 100.0 fL   MCH 30.7 26.0 - 34.0 pg   MCHC 34.2 30.0 - 36.0 g/dL   RDW 12.3 11.5 - 15.5 %   Platelets 218 150 - 400 K/uL  Ethanol (ETOH)     Status: None   Collection Time: 12/19/15  1:42 PM  Result Value Ref Range   Alcohol, Ethyl (B) <5 <5 mg/dL    Comment:        LOWEST DETECTABLE LIMIT FOR SERUM ALCOHOL IS 5 mg/dL FOR MEDICAL PURPOSES ONLY   Salicylate level     Status: None    Collection Time: 12/19/15  1:42 PM  Result Value Ref Range   Salicylate Lvl <0.0 2.8 - 30.0 mg/dL  Acetaminophen level     Status: Abnormal   Collection Time: 12/19/15  1:42 PM  Result Value Ref Range   Acetaminophen (Tylenol), Serum <10 (L) 10 - 30 ug/mL    Comment:        THERAPEUTIC CONCENTRATIONS VARY SIGNIFICANTLY. A RANGE OF 10-30 ug/mL MAY BE AN EFFECTIVE CONCENTRATION FOR MANY PATIENTS. HOWEVER, SOME ARE BEST TREATED AT CONCENTRATIONS OUTSIDE THIS RANGE. ACETAMINOPHEN CONCENTRATIONS >150 ug/mL AT 4 HOURS AFTER INGESTION AND >50 ug/mL AT 12 HOURS AFTER INGESTION ARE OFTEN ASSOCIATED WITH TOXIC REACTIONS.   Blood gas, venous ([facility], [facility], [facility])     Status: Abnormal   Collection Time: 12/19/15  1:44 PM  Result Value Ref Range   FIO2 0.21    Delivery systems ROOM AIR    pH, Ven 7.363 (H) 7.250 - 7.300   pCO2, Ven 44.1 (L) 45.0 - 50.0 mmHg   pO2, Ven 46.6 (H) 30.0 - 45.0 mmHg   Bicarbonate 24.4 (H) 20.0 - 24.0 mEq/L   TCO2 21.7 0 - 100 mmol/L   Acid-base deficit 0.6 0.0 - 2.0 mmol/L   O2 Saturation 82.6 %   Patient temperature 98.6    Collection site VEIN    Drawn by DRAWN BY RN    Sample type VEIN   I-Stat  Chem 8, ED  (not at [facility], [facility])     Status: Abnormal   Collection Time: 12/19/15  1:50 PM  Result Value Ref Range   Sodium 129 (L) 135 - 145 mmol/L   Potassium 4.8 3.5 - 5.1 mmol/L   Chloride 90 (L) 101 - 111 mmol/L   BUN 16 6 - 20 mg/dL   Creatinine, Ser 1.00 0.61 - 1.24 mg/dL   Glucose, Bld >700 (HH) 65 - 99 mg/dL   Calcium, Ion 1.16 1.12 - 1.23 mmol/L   TCO2 26 0 - 100 mmol/L   Hemoglobin 16.0 13.0 - 17.0 g/dL   HCT 47.0 39.0 - 52.0 %   Comment NOTIFIED PHYSICIAN   CBG monitoring, ED     Status: Abnormal   Collection Time: 12/19/15  3:41 PM  Result Value Ref Range   Glucose-Capillary 511 (H) 65 - 99 mg/dL    Current Facility-Administered Medications  Medication Dose Route Frequency Provider Last Rate Last Dose  . 0.9 %  sodium chloride infusion  1,000  mL Intravenous Continuous Dorie Rank, MD 125 mL/hr at 12/19/15 1532 1,000 mL at 12/19/15 1532  . insulin regular (NOVOLIN R,HUMULIN R) 250 Units in sodium chloride 0.9 % 250 mL (1 Units/mL) infusion   Intravenous Continuous Dorie Rank, MD 4.5 mL/hr at 12/19/15 1554 4.5 Units/hr at 12/19/15 1554   Current Outpatient Prescriptions  Medication Sig Dispense Refill  . amphetamine-dextroamphetamine (ADDERALL) 20 MG tablet Take 40 mg by mouth 2 (two) times daily.     . insulin NPH-regular Human (NOVOLIN 70/30 RELION) (70-30) 100 UNIT/ML injection Inject 40 Units into the skin 2 (two) times daily with a meal. 10 mL 11  . QUEtiapine (SEROQUEL) 100 MG tablet Take 1 tablet (100 mg total) by mouth 2 (two) times daily. 14 tablet 0  . traZODone (DESYREL) 100 MG tablet Take 2 tablets (200 mg total) by mouth at bedtime. 60 tablet 0  . [DISCONTINUED] citalopram (CELEXA) 20 MG tablet Take 20 mg by mouth daily.    . [DISCONTINUED] FLUoxetine (PROZAC) 20 MG tablet Take 20 mg by mouth every morning.    . [DISCONTINUED] insulin glargine (LANTUS) 100 UNIT/ML injection Inject 30 Units into the skin at bedtime. 10 mL 3  . [DISCONTINUED] insulin lispro (HUMALOG) 100 UNIT/ML injection Inject 10 Units into the skin 3 (three) times daily before meals.      . [DISCONTINUED] OLANZapine zydis (ZYPREXA) 10 MG disintegrating tablet Take 10 mg by mouth 2 (two) times daily.    . [DISCONTINUED] sertraline (ZOLOFT) 50 MG tablet Take 50 mg by mouth daily. For depression. Just started med, have not picked up yet [facility] [address]      Musculoskeletal: Strength & Muscle Tone: seen lying down in bed Gait & Station: seen lying down in bed Patient leans: see above  Psychiatric Specialty Exam: Review of Systems  Constitutional: Negative.   HENT: Negative.   Eyes: Negative.   Respiratory: Negative.   Cardiovascular: Negative.   Gastrointestinal: Negative.   Genitourinary: Negative.   Musculoskeletal: Negative.   Skin: Negative.    Endo/Heme/Allergies: Positive for polydipsia (Diabestes type 1, uncontrolled ).    Blood pressure 123/87, pulse 107, temperature 97.8 F (36.6 C), temperature source Oral, resp. rate 16, SpO2 99 %.There is no weight on file to calculate BMI.  General Appearance: Casual and emaciated   Eye Contact::  Minimal  Speech:  Clear and Coherent and Slow  Volume:  Decreased  Mood:  Anxious and Depressed  Affect:  Congruent, Depressed and Flat  Thought Process:  Coherent, Goal Directed and Intact  Orientation:  Full (Time, Place, and Person)  Thought Content:  WDL  Suicidal Thoughts:  No  Homicidal Thoughts:  No  Memory:  Immediate;   Good Recent;   Good Remote;   Good  Judgement:  Poor  Insight:  Fair  Psychomotor Activity:  Psychomotor Retardation  Concentration:  Good  Recall:  [place] of Knowledge:Good  Language: Good  Akathisia:  No  Handed:  Right  AIMS (if indicated):     Assets:  Desire for Improvement  ADL's:  Intact  Cognition: WNL  Sleep:      Treatment Plan Summary: Daily contact with patient to assess and evaluate symptoms and progress in treatment, Medication management and Plan -we will get an EKG now-QTC interval  Stop Seroquel as this may be contributing to his blood sugar elevation Use Ativan 1 mg po every 6 hours as needed for agitation May use Haldol 5 mg  intramuscularly every 8 hours as needed for severe agitation.  Please give with Benadryl 12.5 mg injection.  Obtain EKG before administering Abilify 5 mg po daily start 12/20/2015 Trazodone 50 mg po at bed time for sleep. Call for  Psychiatry consult tomorrow  Disposition:  Patient will be seen daily in the medical unit by Psychiatry and will be evaluated daily for inpatient Psychiatric hospitalization.  Delfin Gant   PMHNP-BC 12/19/2015 4:53 PM Agree with NP Note and Assessment

## 2015-12-19 NOTE — Progress Notes (Signed)
Patient arrived to floor at 1725.  Lethargic.  Opens eyes to voice and responds and goes back to sleep.  Glucose stabilizer CBG check at 1745 on floor revealed 142.  Insulin drip titrated to 0.8.

## 2015-12-19 NOTE — Progress Notes (Signed)
Unable to complete patient admission history due to patient falling asleep when I ask questions.

## 2015-12-19 NOTE — ED Notes (Signed)
Bed: BX03 Expected date: 12/19/15 Expected time: 11:57 AM Means of arrival: Ambulance Comments: CBG "high" Medical Clearance, homeless

## 2015-12-20 DIAGNOSIS — F250 Schizoaffective disorder, bipolar type: Secondary | ICD-10-CM

## 2015-12-20 DIAGNOSIS — R443 Hallucinations, unspecified: Secondary | ICD-10-CM

## 2015-12-20 LAB — GLUCOSE, CAPILLARY
GLUCOSE-CAPILLARY: 425 mg/dL — AB (ref 65–99)
GLUCOSE-CAPILLARY: 206 mg/dL — AB (ref 65–99)
GLUCOSE-CAPILLARY: 345 mg/dL — AB (ref 65–99)
Glucose-Capillary: 451 mg/dL — ABNORMAL HIGH (ref 65–99)
Glucose-Capillary: 324 mg/dL — ABNORMAL HIGH (ref 65–99)
Glucose-Capillary: 200 mg/dL — ABNORMAL HIGH (ref 65–99)

## 2015-12-20 LAB — BASIC METABOLIC PANEL
Anion gap: 8 (ref 5–15)
BUN: 14 mg/dL (ref 6–20)
CALCIUM: 9 mg/dL (ref 8.9–10.3)
CHLORIDE: 101 mmol/L (ref 101–111)
CO2: 26 mmol/L (ref 22–32)
CREATININE: 0.52 mg/dL — AB (ref 0.61–1.24)
Glucose, Bld: 403 mg/dL — ABNORMAL HIGH (ref 65–99)
Potassium: 3.8 mmol/L (ref 3.5–5.1)
SODIUM: 135 mmol/L (ref 135–145)

## 2015-12-20 MED ORDER — INSULIN ASPART PROT & ASPART (70-30 MIX) 100 UNIT/ML ~~LOC~~ SUSP
40.0000 [IU] | Freq: Two times a day (BID) | SUBCUTANEOUS | Status: DC
  Administered 2015-12-20 (×2): 40 [IU] via SUBCUTANEOUS
  Filled 2015-12-20: qty 10

## 2015-12-20 NOTE — Progress Notes (Signed)
Inpatient Diabetes Program Recommendations  AACE/ADA: New Consensus Statement on Inpatient Glycemic Control (2015)  Target Ranges:  Prepandial:   less than 140 mg/dL      Peak postprandial:   less than 180 mg/dL (1-2 hours)      Critically ill patients:  140 - 180 mg/dL   Review of Glycemic Control  Results for [name], [name] (MRN [id number]) as of 12/20/2015 11:25  Ref. Range 12/19/2015 19:51 12/19/2015 21:33 12/20/2015 07:46 12/20/2015 09:47 12/20/2015 11:10  Glucose-Capillary Latest Ref Range: 65-99 mg/dL 238 (H) 110 (H) 425 (H) 451 (H) 324 (H)  Results for [name], [name] (MRN [id number]) as of 12/20/2015 11:25  Ref. Range 12/22/2014 03:29  Hemoglobin-A1c Latest Ref Range: 5.4-7.4 % 19.2 (H)  Results for [name], [name] (MRN [id number]) as of 12/20/2015 11:25  Ref. Range 12/20/2015 05:20  Sodium Latest Ref Range: 135-145 mmol/L 135  Potassium Latest Ref Range: 3.5-5.1 mmol/L 3.8  Chloride Latest Ref Range: 101-111 mmol/L 101  CO2 Latest Ref Range: 22-32 mmol/L 26  BUN Latest Ref Range: 6-20 mg/dL 14  Creatinine Latest Ref Range: 0.61-1.24 mg/dL 0.52 (L)  Calcium Latest Ref Range: 8.9-10.3 mg/dL 9.0  EGFR (Non-African Amer.) Latest Ref Range: >60 mL/min >60  EGFR (African American) Latest Ref Range: >60 mL/min >60  Glucose Latest Ref Range: 65-99 mg/dL 403 (H)  Anion gap Latest Ref Range: 5-15  8   Admit with: Hyperglycemia/ Schizoaffective disorder  History: Type 1 DM  Home DM Meds: 70/30 insulin- 40 units bidwc (per notes, patient not taking insulin- ran out several days prior to admission)  Current Insulin Orders: 70/30 insulin- 40 units bidwc  Novolog Moderate SSI (0-15 units) TID AC + HS     -Patient is well known to the Inpatient Glycemic Control Team. He has multiple admissions and ED visits since 11/2014 (for both Psych issues and Hyperglycemia, Running out of insulin, etc).  -Not sure what else to do for this patient. He will take his  insulin intermittently at home and note that patient has a history of psychiatric illness.  -Patient has claimed in the past he cannot afford his insulin. Please provide patient with a Rx for Novolin Reli-on 70/30 insulin at time of d/c [Order #[id number]]. Patient can purchase this particular brand of 70/30 insulin at [facility] for $25 per vial.  -Note patient likely to be transferred to [facility] once CBGs and other issues stabilize.  -70/30 insulin- 40 units bidwc started this morning with breakfast.  --Will follow patient during hospitalization--  Thank you. Lorenda Peck, RD, LDN, CDE Inpatient Diabetes Coordinator [phone]

## 2015-12-20 NOTE — Consult Note (Signed)
[facility] Face-to-Face Psychiatry Consult follow up  Reason for Consult:  Anxiety, psychosis, and non-compliant with medications. Referring Physician:  Dr. Janeann Merl Patient Identification: [name] MRN:  [id number] Principal Diagnosis: Schizoaffective disorder, bipolar type [facility]) Diagnosis:   Patient Active Problem List   Diagnosis Date Noted  . DM hyperosmolarity type I, uncontrolled ([place]) [E10.69, E10.65] 12/19/2015  . Type I diabetes mellitus with hyperosmolarity ([name]) [E10.69, E10.65] 12/19/2015  . Schizoaffective disorder ([place]) [F25.9]   . Left shoulder pain [M25.512] 08/19/2015  . Generalized headache [R51] 07/22/2015  . Bilateral hip bursitis [M70.71, M70.72] 05/26/2015  . Schizoaffective disorder, bipolar type ([place]) [F25.0]   . Depression [F32.9]   . Schizoaffective disorder, unspecified type ([place]) [F25.9] 03/29/2015  . Generalized anxiety disorder [F41.1] 03/29/2015  . Undifferentiated schizophrenia ([place]) [F20.3]   . Schizophrenia ([place]) [F20.9] 03/20/2015  . Hip pain, bilateral [M25.551, M25.552] 11/18/2014  . Hyponatremia [E87.1] 06/17/2012  . Dehydration [E86.0] 06/17/2012    Total Time spent with patient: 30 minutes  Subjective:   [name] is a 30 y.o. male patient admitted with Agitation, Anxiety, medication non compliant.  HPI:  Patient is well known to the service and comes in with elevated blood glucose.  Patient has a hx of schizoaffective disorder, Bipolar type, undifferentiated Schizophrenia  and Generalized anxiety disorder.  Patient reports this evening that he has not been taking both his insulin for two weeks since he had none to take.  He is not sure he is taking his MH medications as prescribed.  He does not have an outpatient provider and does not see any Psychiatrist or counselor in the community.  Patient admitted to using illicit drugs but would not say what and what quantity.  Patient stated"I  Do not want to talk about my drug use now"   He  looks thin and emaciated and stated that Seroquel helps him sleep.  He is taking Adderall but could not say what he is taking this for and who prescribes it.  He denies SI/HI/AVH.  He meets criteria for inpatient hospitalization for safety and stabilization.  Based on his blood sugar and medical needs, patient will be admitted to the medical unit and Psychiatry will be seeing him daily.  Dr Parke Poisson and this writer are in agreement with this plan of care and have advised Dr Algis Liming, hospitalist our recommendations.  Past Psychiatric History: Generalized Anxiety  Disorder, Undifferentiated Schizophrenia, Schizoaffective disorder.  Interval history: Patient seen for psych consultation follow up from [facility]. Patient reportedly non compliant with his medications and started having psychosis like people in [employer] has been acting like him which he did not approve of it and does not know how to stop it which makes him anxious and agitated. Reportedly he is on disability and has his own appointment and goes to [employer] to eat his meals. His primary care physician has stopped prescribing his medications since a month ago and he ran out of his medications. Patient was found with elevated blood sugars on arrival but now they are better with BS of 300's. Patient continue to have A/V/H and no paranoia. He wish to be admitted to [facility] for crisis stabilization, and medication management and making appropriate out patient psych referrals. Dr. Algis Liming is concern about his psychosis may be major factor for him not able to keep up with his insulin therapy and developing extremely elevated blood sugars. Will ask unit social service to refer him to in patient psych facility when medically stable.  Risk to Self: Is patient at risk for suicide?: No, but patient needs Medical Clearance Risk to Others:   Prior Inpatient Therapy:   Prior Outpatient Therapy:    Past Medical History:  Past Medical History  Diagnosis  Date  . Depression   . Schizo affective schizophrenia ([place])   . Polysubstance abuse   . Scoliosis   . Chronic pain   . Noncompliance with medication regimen   . GERD (gastroesophageal reflux disease)   . Arthritis     "both hips" (12/21/2014)  . Asthma     Childhood  . Chicken pox   . Diabetes mellitus     Type 1 diabetes  . DKA (diabetic ketoacidoses) ([place]) 11/18/2014  . Hypertension     Past Surgical History  Procedure Laterality Date  . Hand surgery Left     Left thumb surgery   Family History:  Family History  Problem Relation Age of Onset  . Diabetes Mother    Family Psychiatric  History:  unknown Social History:  History  Alcohol Use No    Comment: 12/21/2014 "I drink on a rare occasion"     History  Drug Use  . Yes  . Special: Marijuana, Cocaine    Comment: crack cocaine    Social History   Social History  . Marital Status: Legally Separated    Spouse Name: N/A  . Number of Children: 0  . Years of Education: 13   Occupational History  . Disability    Social History Main Topics  . Smoking status: Current Every Day Smoker -- 1.00 packs/day for 7 years    Types: Cigarettes  . Smokeless tobacco: Never Used  . Alcohol Use: No     Comment: 12/21/2014 "I drink on a rare occasion"  . Drug Use: Yes    Special: Marijuana, Cocaine     Comment: crack cocaine  . Sexual Activity: Yes   Other Topics Concern  . None   Social History Narrative   ** Merged History Encounter **    Fun: Video games      Additional Social History:     Allergies:   Allergies  Allergen Reactions  . Sulfa Antibiotics Other (See Comments)    Unknown - Childhood    Labs:  Results for orders placed or performed during the hospital encounter of 12/19/15 (from the past 48 hour(s))  CBG monitoring, ED     Status: Abnormal   Collection Time: 12/19/15 12:16 PM  Result Value Ref Range   Glucose-Capillary >600 (HH) 65 - 99 mg/dL  Urinalysis, Routine w reflex microscopic (not at  [facility])     Status: Abnormal   Collection Time: 12/19/15 12:37 PM  Result Value Ref Range   Color, Urine STRAW (A) YELLOW   APPearance CLEAR CLEAR   Specific Gravity, Urine 1.031 (H) 1.005 - 1.030   pH 5.5 5.0 - 8.0   Glucose, UA >1000 (A) NEGATIVE mg/dL   Hgb urine dipstick NEGATIVE NEGATIVE   Bilirubin Urine NEGATIVE NEGATIVE   Ketones, ur 15 (A) NEGATIVE mg/dL   Protein, ur NEGATIVE NEGATIVE mg/dL   Nitrite NEGATIVE NEGATIVE   Leukocytes, UA NEGATIVE NEGATIVE  Urine rapid drug screen (hosp performed) (Not at [facility])     Status: None   Collection Time: 12/19/15 12:37 PM  Result Value Ref Range   Opiates NONE DETECTED NONE DETECTED   Cocaine NONE DETECTED NONE DETECTED   Benzodiazepines NONE DETECTED NONE DETECTED   Amphetamines NONE DETECTED NONE DETECTED  Tetrahydrocannabinol NONE DETECTED NONE DETECTED   Barbiturates NONE DETECTED NONE DETECTED    Comment:        DRUG SCREEN FOR MEDICAL PURPOSES ONLY.  IF CONFIRMATION IS NEEDED FOR ANY PURPOSE, NOTIFY LAB WITHIN 5 DAYS.        LOWEST DETECTABLE LIMITS FOR URINE DRUG SCREEN Drug Class       Cutoff (ng/mL) Amphetamine      1000 Barbiturate      200 Benzodiazepine   283 Tricyclics       662 Opiates          300 Cocaine          300 THC              50   Urine microscopic-add on     Status: Abnormal   Collection Time: 12/19/15 12:37 PM  Result Value Ref Range   Squamous Epithelial / LPF 0-5 (A) NONE SEEN   WBC, UA NONE SEEN 0 - 5 WBC/hpf   RBC / HPF NONE SEEN 0 - 5 RBC/hpf   Bacteria, UA NONE SEEN NONE SEEN  Basic metabolic panel     Status: Abnormal   Collection Time: 12/19/15  1:41 PM  Result Value Ref Range   Sodium 129 (L) 135 - 145 mmol/L   Potassium 4.8 3.5 - 5.1 mmol/L   Chloride 90 (L) 101 - 111 mmol/L   CO2 27 22 - 32 mmol/L   Glucose, Bld 894 (HH) 65 - 99 mg/dL    Comment: CRITICAL RESULT CALLED TO, READ BACK BY AND VERIFIED WITH: MAYHALL,J. RN AT 1443 12/19/15 MULLINS,T    BUN 15 6 - 20 mg/dL    Creatinine, Ser 1.15 0.61 - 1.24 mg/dL   Calcium 9.2 8.9 - 10.3 mg/dL   GFR calc non Af Amer >60 >60 mL/min   GFR calc Af Amer >60 >60 mL/min    Comment: (NOTE) The eGFR has been calculated using the CKD EPI equation. This calculation has not been validated in all clinical situations. eGFR's persistently <60 mL/min signify possible Chronic Kidney Disease.    Anion gap 12 5 - 15  CBC     Status: None   Collection Time: 12/19/15  1:41 PM  Result Value Ref Range   WBC 7.3 4.0 - 10.5 K/uL   RBC 4.69 4.22 - 5.81 MIL/uL   Hemoglobin 14.4 13.0 - 17.0 g/dL   HCT 42.1 39.0 - 52.0 %   MCV 89.8 78.0 - 100.0 fL   MCH 30.7 26.0 - 34.0 pg   MCHC 34.2 30.0 - 36.0 g/dL   RDW 12.3 11.5 - 15.5 %   Platelets 218 150 - 400 K/uL  Ethanol (ETOH)     Status: None   Collection Time: 12/19/15  1:42 PM  Result Value Ref Range   Alcohol, Ethyl (B) <5 <5 mg/dL    Comment:        LOWEST DETECTABLE LIMIT FOR SERUM ALCOHOL IS 5 mg/dL FOR MEDICAL PURPOSES ONLY   Salicylate level     Status: None   Collection Time: 12/19/15  1:42 PM  Result Value Ref Range   Salicylate Lvl <9.4 2.8 - 30.0 mg/dL  Acetaminophen level     Status: Abnormal   Collection Time: 12/19/15  1:42 PM  Result Value Ref Range   Acetaminophen (Tylenol), Serum <10 (L) 10 - 30 ug/mL    Comment:        THERAPEUTIC CONCENTRATIONS VARY SIGNIFICANTLY. A RANGE OF 10-30 ug/mL MAY  BE AN EFFECTIVE CONCENTRATION FOR MANY PATIENTS. HOWEVER, SOME ARE BEST TREATED AT CONCENTRATIONS OUTSIDE THIS RANGE. ACETAMINOPHEN CONCENTRATIONS >150 ug/mL AT 4 HOURS AFTER INGESTION AND >50 ug/mL AT 12 HOURS AFTER INGESTION ARE OFTEN ASSOCIATED WITH TOXIC REACTIONS.   Blood gas, venous ([facility], [facility], [facility])     Status: Abnormal   Collection Time: 12/19/15  1:44 PM  Result Value Ref Range   FIO2 0.21    Delivery systems ROOM AIR    pH, Ven 7.363 (H) 7.250 - 7.300   pCO2, Ven 44.1 (L) 45.0 - 50.0 mmHg   pO2, Ven 46.6 (H) 30.0 - 45.0 mmHg   Bicarbonate 24.4  (H) 20.0 - 24.0 mEq/L   TCO2 21.7 0 - 100 mmol/L   Acid-base deficit 0.6 0.0 - 2.0 mmol/L   O2 Saturation 82.6 %   Patient temperature 98.6    Collection site VEIN    Drawn by DRAWN BY RN    Sample type VEIN   I-Stat Chem 8, ED  (not at [facility])     Status: Abnormal   Collection Time: 12/19/15  1:50 PM  Result Value Ref Range   Sodium 129 (L) 135 - 145 mmol/L   Potassium 4.8 3.5 - 5.1 mmol/L   Chloride 90 (L) 101 - 111 mmol/L   BUN 16 6 - 20 mg/dL   Creatinine, Ser 1.00 0.61 - 1.24 mg/dL   Glucose, Bld >700 (HH) 65 - 99 mg/dL   Calcium, Ion 1.16 1.12 - 1.23 mmol/L   TCO2 26 0 - 100 mmol/L   Hemoglobin 16.0 13.0 - 17.0 g/dL   HCT 47.0 39.0 - 52.0 %   Comment NOTIFIED PHYSICIAN   CBG monitoring, ED     Status: Abnormal   Collection Time: 12/19/15  3:41 PM  Result Value Ref Range   Glucose-Capillary 511 (H) 65 - 99 mg/dL  Glucose, capillary     Status: Abnormal   Collection Time: 12/19/15  5:45 PM  Result Value Ref Range   Glucose-Capillary 142 (H) 65 - 99 mg/dL  Glucose, capillary     Status: None   Collection Time: 12/19/15  6:47 PM  Result Value Ref Range   Glucose-Capillary 95 65 - 99 mg/dL  Glucose, capillary     Status: Abnormal   Collection Time: 12/19/15  7:51 PM  Result Value Ref Range   Glucose-Capillary 238 (H) 65 - 99 mg/dL  Glucose, capillary     Status: Abnormal   Collection Time: 12/19/15  9:33 PM  Result Value Ref Range   Glucose-Capillary 110 (H) 65 - 99 mg/dL  Basic metabolic panel     Status: Abnormal   Collection Time: 12/20/15  5:20 AM  Result Value Ref Range   Sodium 135 135 - 145 mmol/L   Potassium 3.8 3.5 - 5.1 mmol/L    Comment: DELTA CHECK NOTED REPEATED TO VERIFY    Chloride 101 101 - 111 mmol/L   CO2 26 22 - 32 mmol/L   Glucose, Bld 403 (H) 65 - 99 mg/dL   BUN 14 6 - 20 mg/dL   Creatinine, Ser 0.52 (L) 0.61 - 1.24 mg/dL   Calcium 9.0 8.9 - 10.3 mg/dL   GFR calc non Af Amer >60 >60 mL/min   GFR calc Af Amer >60 >60 mL/min    Comment:  (NOTE) The eGFR has been calculated using the CKD EPI equation. This calculation has not been validated in all clinical situations. eGFR's persistently <60 mL/min signify possible Chronic Kidney Disease.  Anion gap 8 5 - 15  Glucose, capillary     Status: Abnormal   Collection Time: 12/20/15  7:46 AM  Result Value Ref Range   Glucose-Capillary 425 (H) 65 - 99 mg/dL  Glucose, capillary     Status: Abnormal   Collection Time: 12/20/15  9:47 AM  Result Value Ref Range   Glucose-Capillary 451 (H) 65 - 99 mg/dL    Current Facility-Administered Medications  Medication Dose Route Frequency Provider Last Rate Last Dose  . 0.9 %  sodium chloride infusion   Intravenous Continuous Modena Jansky, MD 100 mL/hr at 12/20/15 1001    . acetaminophen (TYLENOL) tablet 650 mg  650 mg Oral Q6H PRN Modena Jansky, MD       Or  . acetaminophen (TYLENOL) suppository 650 mg  650 mg Rectal Q6H PRN Modena Jansky, MD      . albuterol (PROVENTIL) (2.5 MG/3ML) 0.083% nebulizer solution 2.5 mg  2.5 mg Nebulization Q2H PRN Modena Jansky, MD      . ARIPiprazole (ABILIFY) tablet 5 mg  5 mg Oral Daily Delfin Gant, NP   5 mg at 12/20/15 0943  . dextrose 5 %-0.45 % sodium chloride infusion   Intravenous Continuous Modena Jansky, MD 75 mL/hr at 12/19/15 1820    . dextrose 50 % solution 25 mL  25 mL Intravenous PRN Modena Jansky, MD      . diphenhydrAMINE (BENADRYL) injection 12.5 mg  12.5 mg Intramuscular Q8H PRN Delfin Gant, NP      . enoxaparin (LOVENOX) injection 40 mg  40 mg Subcutaneous Q24H Modena Jansky, MD   40 mg at 12/19/15 2100  . haloperidol lactate (HALDOL) injection 5 mg  5 mg Intramuscular Q8H PRN Delfin Gant, NP   5 mg at 12/20/15 0347  . insulin aspart (novoLOG) injection 0-15 Units  0-15 Units Subcutaneous TID WC Hewitt Shorts [place], PA-C   0 Units at 12/20/15 3716  . insulin aspart (novoLOG) injection 0-5 Units  0-5 Units Subcutaneous QHS Hewitt Shorts [place], PA-C   0  Units at 12/19/15 2244  . insulin aspart protamine- aspart (NOVOLOG MIX 70/30) injection 40 Units  40 Units Subcutaneous BID WC Modena Jansky, MD   40 Units at 12/20/15 0810  . LORazepam (ATIVAN) tablet 1 mg  1 mg Oral Q8H PRN Delfin Gant, NP   1 mg at 12/20/15 1004   Or  . LORazepam (ATIVAN) injection 1 mg  1 mg Intramuscular Q8H PRN Delfin Gant, NP      . traZODone (DESYREL) tablet 50 mg  50 mg Oral QHS Delfin Gant, NP   50 mg at 12/19/15 2211    Musculoskeletal: Strength & Muscle Tone: seen lying down in bed Gait & Station: seen lying down in bed Patient leans: see above  Psychiatric Specialty Exam: Review of Systems  Constitutional: Negative.   HENT: Negative.   Eyes: Negative.   Respiratory: Negative.   Cardiovascular: Negative.   Gastrointestinal: Negative.   Genitourinary: Negative.   Musculoskeletal: Negative.   Skin: Negative.   Endo/Heme/Allergies: Positive for polydipsia (Diabestes type 1, uncontrolled ).    Blood pressure 107/68, pulse 98, temperature 97.8 F (36.6 C), temperature source Oral, resp. rate 16, SpO2 98 %.There is no weight on file to calculate BMI.  General Appearance: Casual and emaciated   Eye Contact::  Good  Speech:  Clear and Coherent and Slow  Volume:  Decreased  Mood:  Anxious  Affect:  Congruent, Depressed and Flat  Thought Process:  Coherent, Goal Directed and Intact  Orientation:  Full (Time, Place, and Person)  Thought Content:  WDL  Suicidal Thoughts:  No  Homicidal Thoughts:  No  Memory:  Immediate;   Good Recent;   Good Remote;   Good  Judgement:  Poor  Insight:  Fair  Psychomotor Activity:  Normal  Concentration:  Good  Recall:  [place] of Knowledge:Good  Language: Good  Akathisia:  No  Handed:  Right  AIMS (if indicated):     Assets:  Communication Skills Desire for Improvement Financial Resources/Insurance Housing Leisure Time Resilience  ADL's:  Intact  Cognition: WNL  Sleep:       Treatment Plan Summary: Daily contact with patient to assess and evaluate symptoms and progress in treatment, Medication management and Plan -we will get an EKG now-QTC interval  Case discussed with Dr. Algis Liming Continue Ativan 1 mg po every 6 hours as needed for agitation Continue Haldol 5 mg  intramuscularly every 8 hours as needed for severe agitation.   Continue Abilify 30m po daily start 12/20/2015, which may be titrated upto 20 mg if needed clinically Continue Trazodone 50 mg po at bed time for sleep. Appreciate psychiatric consultation and follow up as clinically required Please contact [phone] or [phone] if needs further assistance  Disposition:   Continue Psychiatry consultation follow up as clinically required Unit social service will refer him for inpatient mental health facilities for crisis stabilization Patient does meet criteria for inpatient Psychiatric hospitalization.  JDurward Parcel MD  12/20/2015 10:19 AM

## 2015-12-20 NOTE — Progress Notes (Signed)
PROGRESS NOTE    [name]:[id number] DOB: 02/26/1986 DOA: 12/19/2015 PCP: Jeanine Luz, FNP  HPI/Brief narrative 30 y.o. male , homeless from [employer], PMH of type I DM, schizophrenia and substance abuse [facility]), brought to the [facility] ED by EMS for complaints of hallucinations and homicidal ideations. He was found to be hyperglycemic with blood glucose of 894, not in DKA. Uncontrolled diabetes is most likely secondary to noncompliance with all his medications which is a direct complication of his social situation and psychiatric illness. Briefly placed on IV insulin drip per the glucose stabilizer protocol and then transitioned to 70/30 insulin's. Psychiatric consulted. Patient has been involuntarily committed and has a Recruitment consultant.   Assessment/Plan:  Poorly controlled type I DM with hyperosmolarity - Initial blood glucose 894. No DKA (venous pH 7.363, serum bicarbonate 27 and anion gap 12) - Secondary to medication/insulin noncompliance due to psychiatric issues - Briefly placed on IV insulin drip per the glucose stabilizer protocol. His CBGs quickly came down. He got a dose of Lantus 10 units at bedtime on 1/1 but this is very low maintenance requirement based on his prior noted needs in chart (has been on 70/30 insulin 40 units twice a day). Resumed 70/30 insulin 40 units twice a day.  - Patient has a voracious appetite and may have to titrate insulin's as needed. Added NovoLog SSI moderate sensitivity. Seroquel discontinued on admission due to side effect of hyperglycemia.  - As long as his underlying psychiatric ailment is not stabilized, this is likely to be a recurring problem. - Hemoglobin A1c on 11/05/15:14.6  Dehydration - IV fluids. Resolved. DC IV fluids.   Hyponatremia - Secondary to dehydration and hyperglycemia (pseudohyponatremia) - resolved.   Schizophrenia  - Patient is status post Geodon 20 mg IM 1 and Ativan 1 mg IV 1 in ED -  He has been involuntarily committed by EDP and has 1:1 Recruitment consultant. - Psychiatric consultation and follow-up appreciated. Patient on PRN's: Ativan, Haldol and trazodone. Started Abilify 5 MG on 12/20/15   Substance abuse/THC   DVT prophylaxis: Lovenox Code Status: Full-presumed  Family Communication: None at bedside.  Disposition Plan: To be determined.   Consultants:  Psychiatry  Procedures:  None  Antibiotics:  None   Subjective: Patient asking for more food. Seems to be having paranoia and hallucinations prior to admission. States that some people that he knows keep talking and feels that they are meaning him harm. No suicidal or homicidal ideations reported.   Objective: Filed Vitals:   12/19/15 1730 12/19/15 2135 12/20/15 0649 12/20/15 1452  BP: 119/79 109/68 107/68 121/78  Pulse: 105 99 98 96  Temp: 97.9 F (36.6 C) 97.8 F (36.6 C) 97.8 F (36.6 C) 98.4 F (36.9 C)  TempSrc: Oral Oral Oral Oral  Resp: 17 18 16 16   SpO2: 98% 98% 98% 98%    Intake/Output Summary (Last 24 hours) at 12/20/15 1556 Last data filed at 12/20/15 1522  Gross per 24 hour  Intake   1200 ml  Output   4800 ml  Net  -3600 ml   There were no vitals filed for this visit.   Exam:  General exam: Moderately built and nourished young male sitting up comfortably in bed. Cooperative to interview and exam today (was not yesterday)  Respiratory system: Clear. No increased work of breathing. Cardiovascular system: S1 & S2 heard, RRR. No JVD, murmurs, gallops, clicks or pedal edema. Gastrointestinal system: Abdomen is nondistended, soft and nontender.  Normal bowel sounds heard. Central nervous system: Alert and oriented. No focal neurological deficits. Extremities: Symmetric 5 x 5 power. Psychiatry: Paranoid.    Data Reviewed: Basic Metabolic Panel:  Recent Labs Lab 12/19/15 1341 12/19/15 1350 12/20/15 0520  NA 129* 129* 135  K 4.8 4.8 3.8  CL 90* 90* 101  CO2 27  --  26    GLUCOSE 894* >700* 403*  BUN 15 16 14   CREATININE 1.15 1.00 0.52*  CALCIUM 9.2  --  9.0   Liver Function Tests: No results for input(s): AST, ALT, ALKPHOS, BILITOT, PROT, ALBUMIN in the last 168 hours. No results for input(s): LIPASE, AMYLASE in the last 168 hours. No results for input(s): AMMONIA in the last 168 hours. CBC:  Recent Labs Lab 12/19/15 1341 12/19/15 1350  WBC 7.3  --   HGB 14.4 16.0  HCT 42.1 47.0  MCV 89.8  --   PLT 218  --    Cardiac Enzymes: No results for input(s): CKTOTAL, CKMB, CKMBINDEX, TROPONINI in the last 168 hours. BNP (last 3 results) No results for input(s): PROBNP in the last 8760 hours. CBG:  Recent Labs Lab 12/19/15 2133 12/20/15 0746 12/20/15 0947 12/20/15 1110 12/20/15 1409  GLUCAP 110* 425* 451* 324* 206*    No results found for this or any previous visit (from the past 240 hour(s)).         Studies: No results found.      Scheduled Meds: . ARIPiprazole  5 mg Oral Daily  . enoxaparin (LOVENOX) injection  40 mg Subcutaneous Q24H  . insulin aspart  0-15 Units Subcutaneous TID WC  . insulin aspart  0-5 Units Subcutaneous QHS  . insulin aspart protamine- aspart  40 Units Subcutaneous BID WC  . traZODone  50 mg Oral QHS   Continuous Infusions: . sodium chloride 100 mL/hr at 12/20/15 1001  . dextrose 5 % and 0.45% NaCl 75 mL/hr at 12/19/15 1820    Principal Problem:   Schizoaffective disorder, bipolar type ([name]) Active Problems:   Hyponatremia   Dehydration   Schizophrenia ([name])   DM hyperosmolarity type I, uncontrolled ([name])   Type I diabetes mellitus with hyperosmolarity ([name])    Time spent: 30 minutes.    02/16/16, MD, FACP, FHM. [facility] Pager [phone]  If 7PM-7AM, please contact night-coverage [email or web address] Password [email or web address] 12/20/2015, 3:56 PM    LOS: 1 day

## 2015-12-20 NOTE — Clinical Social Work Psych Assess (Signed)
Clinical Social Work Nature conservation officer  Clinical Social Worker:  Linna Darner, LCSW Date/Time:  12/20/2015, 1:46 PM Referred By:  Physician Date Referred:  12/20/15 Reason for Referral:      Presenting Symptoms/Problems  Presenting Symptoms/Problems(in person's/family's own words): Pt has a hx of schizoaffective d/o, bi-polar type and GAD. Pt states his primary [place] concern is, "actors in the community" are making him mad. Pt states these "actors" repeatedly try to enter his head, even while pt is in the hospital. Pt both see's and hears the "actors."    Abuse/Neglect/Trauma History  Abuse/Neglect/Trauma History:   None reported    Psychiatric History  Psychiatric History:  Outpatient Treatment Psychiatric Medication: Pt states he was prev receiving medication from his PCP, name [facility]. Pt reports his PCP discharged him from the practice; pt is unsure why. Pt has left over Seroquel that he was taking prior to this hospitalization for "sleep."    Current Mental Health Hospitalizations/Previous Mental Health History:  Pt is being followed daily by psychiatry to assess if inpatient [place] treatment is recommended. No recommendation at this time. Pt denies having an outpatient MH/SA provider.    Current Medications:  Pt was taking Seroquel prior to this hospitalization; this was left over medication from his former PCP.    Previous Inpatient Admission/Date/Reason: 12/03/15, [facility], dry mouth  12/03/15, [facility], chills 11/04/15, [facility], hyperglycemia 06/01/15, [facility], diabetic ketoacidosis 05/17/15, [facility], dental pain 05/13/15, [facility], hyperglycemia 04/19/15, [facility], SI 04/18/15, [facility], alcohol intoxication and mania 03/31/15, [facility], hallucinations 03/19/15, [facility] admission, schizophrenia 03/18/15, [facility], medical clearance 02/05/15, [facility], medication refill 01/31/15, [facility], diabetic ketoacidosis 02/02/15, [place] admission, [facility] 12/21/14, [place] admission, hyperglycemia     Emotional Health/Current Symptoms  Suicide/Self Harm: Suicide Attempt in the Past (date/description) (Last attempt was 5+ years ago. No current or recent SI) Suicide Attempt in Past (date/description):  Pt denies current or recent SI. Pt states his last attempt was "probably more than five years ago"   Other Harmful Behavior (ex. homicidal ideation) (describe):  None reported   Psychotic/Dissociative Symptoms  Psychotic/Dissociative Symptoms: Delusional, Visual Hallucinations, Auditory Hallucinations Other Psychotic/Dissociative Symptoms:  Pt reports seeing/hearing "actors" in the community that are trying to get into his head; this makes him angry. Pt also reports the TV sends him private jokes and messages.    Attention/Behavioral Symptoms  Attention/Behavioral Symptoms: Within Normal Limits Other Attention/Behavioral Symptoms: Of note, CSW observes pt has poor boundaries. Pt made two inappropriate comments about marrying CSW during assessment.    Cognitive Impairment  Cognitive Impairment:  Within Normal Limits    Mood and Adjustment  Mood and Adjustment:   (Cooperative)   Stress, Anxiety, Trauma, Any Recent Loss/Stressor  Stress, Anxiety, Trauma, Any Recent Loss/Stressor: Anxiety Anxiety (frequency):  Although pt doesn't appear to be anxious he states that the "actors" he sees/hears make him anxious and angry.  Phobia (specify):  N/A  Compulsive Behavior (specify):  N/A  Obsessive Behavior (specify): N/A  Other Stress, Anxiety, Trauma, Any Recent Loss/Stressor:  N/A   Substance Abuse/Use  Substance Abuse/Use: Current Substance Use SBIRT Completed (please refer for detailed history): No Self-reported Substance Use (last use and frequency):  Pt reports he has a "long track record" of not using drugs, but did use crack cocaine on New Year's eve. Pt states, "I'm not a crack head or anything, just sometimes, here and there." Pt declines to discuss further SA.  Pt's UDS was clean.   Urinary Drug Screen Completed: Yes Alcohol Level:  N/A   Environment/Housing/Living Arrangement  Environmental/Housing/Living Arrangement: Stable Housing Who is in the Home:  Pt lives alone in an apartment.   Emergency Contact:  Pt's mother is listed. Pt reports she lives in [place].    Financial  Financial:  ([employer])   Patient's Strengths and Goals  Patient's Strengths and Goals (patient's own words):  Pt reports he recently cut ties with all of his friends, stating they were a negative influence. "I've lost all my friends on purpose, they were more bad than good." Pt views this as a positive change.    Clinical Social Worker's Interpretive Summary  Clinical Social Workers Interpretive Summary:  CSW met with pt at bedside to further assess needs. Pt was cooperative when discussing his MH needs, however declined to elaborate on SA. Pt acknowledges the need for medication but has a hx of non-compliance and didn't appear to be concerned that he didn't have an outpatient provider to continue any medication. CSW stressed the importance of adhering to recommendations upon discharge and pt expressed understanding. Pt reports current AH/VH/delusions that actors are trying to enter his head. Pt states this makes him angry, but doesn't appear angry during assessment. Pt has a hx of schizoaffective d/o, bi-polar type and will be followed by psychiatry daily until he's medically cleared. At that time psychiatry will either recommended inpatient MH treatment or outpatient follow-up. CSW will continue to follow for d/c needs.    Disposition  Disposition: Recommend Psych CSW Continuing To Support While In [facility]

## 2015-12-21 LAB — GLUCOSE, CAPILLARY
GLUCOSE-CAPILLARY: 302 mg/dL — AB (ref 65–99)
GLUCOSE-CAPILLARY: 220 mg/dL — AB (ref 65–99)
GLUCOSE-CAPILLARY: 138 mg/dL — AB (ref 65–99)
Glucose-Capillary: 324 mg/dL — ABNORMAL HIGH (ref 65–99)
Glucose-Capillary: 293 mg/dL — ABNORMAL HIGH (ref 65–99)

## 2015-12-21 MED ORDER — NICOTINE 7 MG/24HR TD PT24
7.0000 mg | MEDICATED_PATCH | Freq: Every day | TRANSDERMAL | Status: DC
  Administered 2015-12-21 – 2015-12-24 (×4): 7 mg via TRANSDERMAL
  Filled 2015-12-21 (×5): qty 1

## 2015-12-21 MED ORDER — INSULIN ASPART PROT & ASPART (70-30 MIX) 100 UNIT/ML ~~LOC~~ SUSP
45.0000 [IU] | Freq: Two times a day (BID) | SUBCUTANEOUS | Status: DC
  Administered 2015-12-21 – 2015-12-23 (×5): 45 [IU] via SUBCUTANEOUS
  Filled 2015-12-21: qty 10

## 2015-12-21 MED ORDER — LORAZEPAM 1 MG PO TABS
1.0000 mg | ORAL_TABLET | Freq: Once | ORAL | Status: AC
Start: 2015-12-21 — End: 2015-12-21
  Administered 2015-12-21: 1 mg via ORAL
  Filled 2015-12-21: qty 1

## 2015-12-21 MED ORDER — MAGNESIUM HYDROXIDE 400 MG/5ML PO SUSP
15.0000 mL | Freq: Once | ORAL | Status: AC
  Administered 2015-12-21: 15 mL via ORAL
  Filled 2015-12-21: qty 30

## 2015-12-21 NOTE — Progress Notes (Signed)
CSW faxed IVC paperwork to magistrate during late evening hours upon Med/Psych CSW request.  Trish Mage [phone] ED CSW 12/21/2015 6:34 PM

## 2015-12-21 NOTE — Progress Notes (Signed)
Pt having multiple food trays per meal. On 12/21/2015, pt went considerably over carbohydrates for day. Pt has been informed of the carbohydrate overage. Dietary is aware of pt ordering multiple trays per meal. Joyce Copa, RN

## 2015-12-21 NOTE — Progress Notes (Signed)
PROGRESS NOTE    [name]:[id number] DOB: Jul 10, 1986 DOA: 12/19/2015 PCP: Jeanine Luz, FNP  HPI/Brief narrative 30 y.o. male , homeless from [employer], PMH of type I DM, schizophrenia and substance abuse [facility]), brought to the [facility] ED by EMS for complaints of hallucinations and homicidal ideations. He was found to be hyperglycemic with blood glucose of 894, not in DKA. Uncontrolled diabetes is most likely secondary to noncompliance with all his medications which is a direct complication of his social situation and psychiatric illness. Briefly placed on IV insulin drip per the glucose stabilizer protocol and then transitioned to 70/30 insulin's. Psychiatric consulted. Patient has been involuntarily committed and has a Recruitment consultant.   Assessment/Plan:  Poorly controlled type I DM with hyperosmolarity - Initial blood glucose 894. No DKA (venous pH 7.363, serum bicarbonate 27 and anion gap 12) - Secondary to medication/insulin noncompliance due to psychiatric issues - Briefly placed on IV insulin drip per the glucose stabilizer protocol. His CBGs quickly came down. He got a dose of Lantus 10 units at bedtime on 1/1 but this is very low maintenance requirement based on his prior noted needs in chart (has been on 70/30 insulin 40 units twice a day). Resumed 70/30 insulin 40 units twice a day.  - Patient has a voracious appetite and may have to titrate insulin's as needed. Added NovoLog SSI moderate sensitivity. Seroquel discontinued on admission due to side effect of hyperglycemia.  - As long as his underlying psychiatric ailment is not stabilized, this is likely to be a recurring problem. - Hemoglobin A1c on 11/05/15:14.6 - CBGs elevated in the 200s-300s. Increased 70/30 insulin to 45 units twice a day on 1/3.  Dehydration - IV fluids. Resolved. DC IV fluids.   Hyponatremia - Secondary to dehydration and hyperglycemia (pseudohyponatremia) - resolved.    Schizophrenia  - Patient is status post Geodon 20 mg IM 1 and Ativan 1 mg IV 1 in ED - He has been involuntarily committed by EDP and has 1:1 Recruitment consultant. - Psychiatric consultation and follow-up appreciated. Patient on PRN's: Ativan, Haldol and trazodone. Started Abilify 5 MG on 12/20/15   Substance abuse/THC  ? Chronic left shoulder pain - States that he's had this for a long time. Keeps asking for morphine but advised him that he has to try simpler medications i.e. Tylenol or NSAIDs prior. No acute findings on exam.   DVT prophylaxis: Lovenox Code Status: Full-presumed  Family Communication: None at bedside.  Disposition Plan: To be determined. Await psychiatry follow-up-may be medically stable for discharge on 12/22/15 and? Admission to [facility]  Consultants:  Psychiatry  Procedures:  None  Antibiotics:  None   Subjective: Denies complaints. States that he wants to be discharged or wants to sign out AMA-informed him that he cannot sign out AMA because he has been involuntarily committed. Advised him that he has to wait on the psychiatrist to clear him or discontinue IVC before discharge plans. Intermittent complaints of left shoulder pain and asks for morphine. Asking to increase anxiety medications. As per RN, no acute events reported.  Objective: Filed Vitals:   12/20/15 1452 12/20/15 2218 12/21/15 0539 12/21/15 1400  BP: 121/78 111/82 112/77 107/76  Pulse: 96 87 95 98  Temp: 98.4 F (36.9 C) 98.1 F (36.7 C) 98.6 F (37 C) 98.3 F (36.8 C)  TempSrc: Oral Oral Oral Oral  Resp: 16 16 18 16   SpO2: 98% 99% 98% 98%    Intake/Output Summary (  Last 24 hours) at 12/21/15 1734 Last data filed at 12/21/15 1245  Gross per 24 hour  Intake   2400 ml  Output   2776 ml  Net   -376 ml   There were no vitals filed for this visit.   Exam:  General exam: Moderately built and nourished young male seen ambulating comfortably in the halls with  supervision.  Respiratory system: Clear. No increased work of breathing. Cardiovascular system: S1 & S2 heard, RRR. No JVD, murmurs, gallops, clicks or pedal edema. Gastrointestinal system: Abdomen is nondistended, soft and nontender. Normal bowel sounds heard. Central nervous system: Alert and oriented. No focal neurological deficits. Extremities: Symmetric 5 x 5 power. Left shoulder exam without acute findings and full range of movements. Psychiatry: Paranoid.    Data Reviewed: Basic Metabolic Panel:  Recent Labs Lab 12/19/15 1341 12/19/15 1350 12/20/15 0520  NA 129* 129* 135  K 4.8 4.8 3.8  CL 90* 90* 101  CO2 27  --  26  GLUCOSE 894* >700* 403*  BUN 15 16 14   CREATININE 1.15 1.00 0.52*  CALCIUM 9.2  --  9.0   Liver Function Tests: No results for input(s): AST, ALT, ALKPHOS, BILITOT, PROT, ALBUMIN in the last 168 hours. No results for input(s): LIPASE, AMYLASE in the last 168 hours. No results for input(s): AMMONIA in the last 168 hours. CBC:  Recent Labs Lab 12/19/15 1341 12/19/15 1350  WBC 7.3  --   HGB 14.4 16.0  HCT 42.1 47.0  MCV 89.8  --   PLT 218  --    Cardiac Enzymes: No results for input(s): CKTOTAL, CKMB, CKMBINDEX, TROPONINI in the last 168 hours. BNP (last 3 results) No results for input(s): PROBNP in the last 8760 hours. CBG:  Recent Labs Lab 12/20/15 2228 12/21/15 0543 12/21/15 0705 12/21/15 1123 12/21/15 1703  GLUCAP 345* 302* 324* 220* 293*    No results found for this or any previous visit (from the past 240 hour(s)).         Studies: No results found.      Scheduled Meds: . ARIPiprazole  5 mg Oral Daily  . enoxaparin (LOVENOX) injection  40 mg Subcutaneous Q24H  . insulin aspart  0-15 Units Subcutaneous TID WC  . insulin aspart  0-5 Units Subcutaneous QHS  . insulin aspart protamine- aspart  45 Units Subcutaneous BID WC  . traZODone  50 mg Oral QHS   Continuous Infusions:    Principal Problem:   Schizoaffective  disorder, bipolar type ([name]) Active Problems:   Hyponatremia   Dehydration   Schizophrenia ([name])   DM hyperosmolarity type I, uncontrolled ([name])   Type I diabetes mellitus with hyperosmolarity ([name])   Hallucinations    Time spent: 20 minutes.    02/18/16, MD, FACP, FHM. [facility] Pager [phone]  If 7PM-7AM, please contact night-coverage [email or web address] Password [email or web address] 12/21/2015, 5:34 PM    LOS: 2 days

## 2015-12-21 NOTE — Clinical Social Work Note (Signed)
CSW completed necessary IVC documentation which was signed by the attending, Dr. Waymon Amato. Paperwork will need to be faxed to the magistrate's office at 5:40pm [when pt's prev IVC expires]. CSW spoke with ED CSW who agrees to fax documentation this evening.   Etta Quill, LCSW [phone] Hospital psychiatric & 5E, 5W 31-35 Licensed Clinical Social Worker

## 2015-12-22 DIAGNOSIS — E1069 Type 1 diabetes mellitus with other specified complication: Secondary | ICD-10-CM

## 2015-12-22 DIAGNOSIS — E1065 Type 1 diabetes mellitus with hyperglycemia: Secondary | ICD-10-CM

## 2015-12-22 DIAGNOSIS — E860 Dehydration: Secondary | ICD-10-CM

## 2015-12-22 DIAGNOSIS — F259 Schizoaffective disorder, unspecified: Secondary | ICD-10-CM

## 2015-12-22 DIAGNOSIS — E871 Hypo-osmolality and hyponatremia: Secondary | ICD-10-CM

## 2015-12-22 LAB — GLUCOSE, CAPILLARY
GLUCOSE-CAPILLARY: 129 mg/dL — AB (ref 65–99)
Glucose-Capillary: 366 mg/dL — ABNORMAL HIGH (ref 65–99)
Glucose-Capillary: 259 mg/dL — ABNORMAL HIGH (ref 65–99)
Glucose-Capillary: 58 mg/dL — ABNORMAL LOW (ref 65–99)
Glucose-Capillary: 147 mg/dL — ABNORMAL HIGH (ref 65–99)

## 2015-12-22 MED ORDER — TRAMADOL HCL 50 MG PO TABS
100.0000 mg | ORAL_TABLET | Freq: Four times a day (QID) | ORAL | Status: DC | PRN
  Administered 2015-12-22 – 2015-12-24 (×6): 100 mg via ORAL
  Filled 2015-12-22 (×7): qty 2

## 2015-12-22 MED ORDER — ARIPIPRAZOLE 5 MG PO TABS
5.0000 mg | ORAL_TABLET | Freq: Every day | ORAL | Status: DC
  Administered 2015-12-23: 5 mg via ORAL
  Filled 2015-12-22 (×2): qty 1

## 2015-12-22 MED ORDER — KETOROLAC TROMETHAMINE 30 MG/ML IJ SOLN: 30.0000 mg | Freq: Four times a day (QID) | INTRAMUSCULAR | Status: DC | PRN

## 2015-12-22 MED ORDER — GI COCKTAIL ~~LOC~~
30.0000 mL | Freq: Three times a day (TID) | ORAL | Status: DC | PRN
  Administered 2015-12-22 – 2015-12-24 (×3): 30 mL via ORAL
  Filled 2015-12-22 (×6): qty 30

## 2015-12-22 MED ORDER — PANTOPRAZOLE SODIUM 40 MG PO TBEC
40.0000 mg | DELAYED_RELEASE_TABLET | Freq: Every day | ORAL | Status: DC
  Administered 2015-12-22 – 2015-12-24 (×3): 40 mg via ORAL
  Filled 2015-12-22 (×3): qty 1

## 2015-12-22 MED ORDER — INSULIN ASPART 100 UNIT/ML ~~LOC~~ SOLN
0.0000 [IU] | Freq: Three times a day (TID) | SUBCUTANEOUS | Status: DC
  Administered 2015-12-22: 20 [IU] via SUBCUTANEOUS
  Administered 2015-12-22: 11 [IU] via SUBCUTANEOUS
  Administered 2015-12-22: 3 [IU] via SUBCUTANEOUS
  Administered 2015-12-23 (×2): 11 [IU] via SUBCUTANEOUS

## 2015-12-22 MED ORDER — TRAZODONE HCL 100 MG PO TABS
100.0000 mg | ORAL_TABLET | Freq: Every day | ORAL | Status: DC
  Administered 2015-12-22 – 2015-12-23 (×2): 100 mg via ORAL
  Filled 2015-12-22 (×4): qty 1

## 2015-12-22 NOTE — Clinical Social Work Note (Addendum)
11:35am- CSW spoke with admission staff at [facility] and [facility]. [facility] is full but pt is on the wait list. [facility] is reviewing the pt for placement.  2:00pm- Per attending, pt is not medically cleared for d/c. CSW will fax out to additional inpatient psych facilities once pt is medically cleared.   Etta Quill, LCSW [phone] Hospital psychiatric & 5E, 5W 31-35 Licensed Clinical Social Worker

## 2015-12-22 NOTE — Clinical Social Work Note (Signed)
CSW completed necessary IVC paperwork and had attending, Dr. Janee Morn sign off. CSW confirmed with the magistrates office that paperwork was received and pt will be served.    Etta Quill, LCSW [phone] Hospital psychiatric & 5E, 5W 31-35 Licensed Clinical Social Worker

## 2015-12-22 NOTE — Progress Notes (Signed)
Inpatient Diabetes Program Recommendations  AACE/ADA: New Consensus Statement on Inpatient Glycemic Control (2015)  Target Ranges:  Prepandial:   less than 140 mg/dL      Peak postprandial:   less than 180 mg/dL (1-2 hours)      Critically ill patients:  140 - 180 mg/dL   Review of Glycemic Control   Inpatient Diabetes Program Recommendations:    Current Insulin Orders: 70/30 insulin- 45 units bidwc  Novolog Resistant SSI (0-20 units) TID AC  Results for BINYAMIN, NELIS (MRN [id number]) as of 12/22/2015 11:40  Ref. Range 12/21/2015 07:05 12/21/2015 11:23 12/21/2015 17:03 12/21/2015 21:33 12/22/2015 07:11  Glucose-Capillary Latest Ref Range: 65-99 mg/dL 102 (H) 725 (H) 366 (H) 138 (H) 366 (H)      MD- Note patient has Psychiatric illness and has been requesting/eating lots of extra food which is making it difficult to manage his blood sugars.  Please consider increasing 70/30 insulin to 48 units bidwc (20% increase of total dose of 70/30 insulin)     --Will follow patient during hospitalization-- Thank you. Ailene Ards, RD, LDN, CDE Inpatient Diabetes Coordinator [phone]

## 2015-12-22 NOTE — Care Management Important Message (Signed)
Important Message  Patient Details  Name: [name] MRN: [id number] Date of Birth: [birth year]   Medicare Important Message Given:  Yes    Haskell Flirt 12/22/2015, 10:32 AMImportant Message  Patient Details  Name: [name] MRN: [id number] Date of Birth: 09/19/1986   Medicare Important Message Given:  Yes    Haskell Flirt 12/22/2015, 10:32 AM

## 2015-12-22 NOTE — Progress Notes (Signed)
[facility] PROGRESS NOTE  [name] RJJ:[id number] DOB: 06/24/1986 DOA: 12/19/2015 PCP: Jeanine Luz, FNP   HPI/Brief narrative 30 y.o. male , homeless from [employer], PMH of type I DM, schizophrenia and substance abuse [facility]), brought to the [facility] ED by EMS for complaints of hallucinations and homicidal ideations. He was found to be hyperglycemic with blood glucose of 894, not in DKA. Uncontrolled diabetes is most likely secondary to noncompliance with all his medications which is a direct complication of his social situation and psychiatric illness. Briefly placed on IV insulin drip per the glucose stabilizer protocol and then transitioned to 70/30 insulin's. Psychiatric consulted. Patient has been involuntarily committed and has a Recruitment consultant.   Assessment/Plan: #1 poorly controlled type 1 diabetes with hyperosmolarity Secondary to medical noncompliance. Initial blood glucose was 894 and admission. Patient was not in DKA as he had a serum bicarbonate level of 27 anion gap of 12 and a venous pH of 7.363. Patient was placed on the glucose stabilizer and has been subsequently transitioned to subcutaneous insulin. Hemoglobin A1c was 14.6. CBGs have ranged from 138 - 366. 70/30 has been increased to 45 units twice daily. Titrate 7030. Continue sliding scale insulin. Follow.  #2 dehydration Resolved.  #3 pseudo-hyponatremia Secondary to dehydration and hyperglycemia. Improved.  #4 schizophrenia Patient is status post Geodon 20 mg IM 1 and Ativan 1 mg IV 1 in the ED. Patient was involuntarily committed by EDP and has a one-to-one Recruitment consultant. Will renew involuntary commitment papers. Patient has been seen by psychiatry on consultation who recommended to continue Ativan 1 mg every 6 hours as needed for agitation, Haldol 5 mg IM every 8 hours as needed for severe agitation, Abilify, trazodone for sleep. Patient per psychiatry consultation will need inpatient  psychiatric treatment. Psychiatry following.  #5 substance abuse/THC Social work following.  #6? Chronic left shoulder pain Patient states he's had this for a while. Patient asking for morphine. Patient states was on Tylenol No. 3 in the past however no significant improvement with this pain. Will place patient on Ultram as needed.  #7 prophylaxis PPI for GI prophylaxis. Lovenox for DVT prophylaxis.  Code Status: Full Family Communication: Updated patient. No family present. Disposition Plan: Per psychiatry once blood sugars are better controlled, will need inpatient psychiatric admission.    Consultants:  Psychiatry: Dr. Elsie Saas 12/20/2015  Procedures:  None  Antibiotics:  None  HPI/Subjective: Patient denies any chest pain or shortness of breath. Patient asking whether he can sign out AMA. Patient asking for pain medication for left shoulder pain. Patient denies any suicidal ideations. Patient denies any hallucinations.  Objective: Filed Vitals:   12/21/15 2126 12/22/15 0631  BP: 113/76 104/53  Pulse: 98 98  Temp: 98.5 F (36.9 C) 97.9 F (36.6 C)  Resp: 18 18    Intake/Output Summary (Last 24 hours) at 12/22/15 1123 Last data filed at 12/21/15 1700  Gross per 24 hour  Intake    480 ml  Output   1750 ml  Net  -1270 ml   There were no vitals filed for this visit.  Exam:   General:  NAD  Cardiovascular: RRR  Respiratory: CTAB  Abdomen: soft/NT/ND/+BS  Musculoskeletal: No c/c/e  Data Reviewed: Basic Metabolic Panel:  Recent Labs Lab 12/19/15 1341 12/19/15 1350 12/20/15 0520  NA 129* 129* 135  K 4.8 4.8 3.8  CL 90* 90* 101  CO2 27  --  26  GLUCOSE 894* >700* 403*  BUN 15 16  14  CREATININE 1.15 1.00 0.52*  CALCIUM 9.2  --  9.0   Liver Function Tests: No results for input(s): AST, ALT, ALKPHOS, BILITOT, PROT, ALBUMIN in the last 168 hours. No results for input(s): LIPASE, AMYLASE in the last 168 hours. No results for input(s):  AMMONIA in the last 168 hours. CBC:  Recent Labs Lab 12/19/15 1341 12/19/15 1350  WBC 7.3  --   HGB 14.4 16.0  HCT 42.1 47.0  MCV 89.8  --   PLT 218  --    Cardiac Enzymes: No results for input(s): CKTOTAL, CKMB, CKMBINDEX, TROPONINI in the last 168 hours. BNP (last 3 results) No results for input(s): BNP in the last 8760 hours.  ProBNP (last 3 results) No results for input(s): PROBNP in the last 8760 hours.  CBG:  Recent Labs Lab 12/21/15 0705 12/21/15 1123 12/21/15 1703 12/21/15 2133 12/22/15 0711  GLUCAP 324* 220* 293* 138* 366*    No results found for this or any previous visit (from the past 240 hour(s)).   Studies: No results found.  Scheduled Meds: . ARIPiprazole  5 mg Oral Daily  . enoxaparin (LOVENOX) injection  40 mg Subcutaneous Q24H  . insulin aspart  0-20 Units Subcutaneous TID WC  . insulin aspart protamine- aspart  45 Units Subcutaneous BID WC  . nicotine  7 mg Transdermal Daily  . pantoprazole  40 mg Oral Daily  . traZODone  50 mg Oral QHS   Continuous Infusions:   Principal Problem:   Schizoaffective disorder, bipolar type ([name]) Active Problems:   Hyponatremia   Dehydration   Schizophrenia ([name])   DM hyperosmolarity type I, uncontrolled ([name])   Type I diabetes mellitus with hyperosmolarity ([name])   Hallucinations    Time spent: 66 MINS    [facility] MD [facility] Pager [phone]. If 7PM-7AM, please contact night-coverage at [email or web address], password [email or web address] 12/22/2015, 11:23 AM  LOS: 3 days

## 2015-12-22 NOTE — Progress Notes (Signed)
Pending review for possible placement with [facility].  

## 2015-12-22 NOTE — Consult Note (Signed)
[facility] Face-to-Face Psychiatry Consult follow up  Reason for Consult:  Anxiety, psychosis, and non-compliant with medications. Referring Physician:  Dr. Iva Lento Patient Identification: [name] MRN:  [id number] Principal Diagnosis: Schizoaffective disorder, bipolar type [facility]) Diagnosis:   Patient Active Problem List   Diagnosis Date Noted  . Hallucinations [R44.3]   . DM hyperosmolarity type I, uncontrolled ([name]) [E10.69, E10.65] 12/19/2015  . Type I diabetes mellitus with hyperosmolarity ([name]) [E10.69, E10.65] 12/19/2015  . Schizoaffective disorder ([name]) [F25.9]   . Left shoulder pain [M25.512] 08/19/2015  . Generalized headache [R51] 07/22/2015  . Bilateral hip bursitis [M70.71, M70.72] 05/26/2015  . Schizoaffective disorder, bipolar type ([name]) [F25.0]   . Depression [F32.9]   . Schizoaffective disorder, unspecified type ([name]) [F25.9] 03/29/2015  . Generalized anxiety disorder [F41.1] 03/29/2015  . Undifferentiated schizophrenia ([name]) [F20.3]   . Schizophrenia ([name]) [F20.9] 03/20/2015  . Hip pain, bilateral [M25.551, M25.552] 11/18/2014  . Hyponatremia [E87.1] 06/17/2012  . Dehydration [E86.0] 06/17/2012    Total Time spent with patient: 30 minutes  Subjective:   [name] is a 30 y.o. male patient admitted with Agitation, Anxiety, medication non compliant.  HPI:  Patient is well known to the service and comes in with elevated blood glucose.  Patient has a hx of schizoaffective disorder, Bipolar type, undifferentiated Schizophrenia  and Generalized anxiety disorder.  Patient reports this evening that he has not been taking both his insulin for two weeks since he had none to take.  He is not sure he is taking his MH medications as prescribed.  He does not have an outpatient provider and does not see any Psychiatrist or counselor in the community.  Patient admitted to using illicit drugs but would not say what and what quantity.  Patient stated"I  Do not want to talk  about my drug use now"   He looks thin and emaciated and stated that Seroquel helps him sleep.  He is taking Adderall but could not say what he is taking this for and who prescribes it.  He denies SI/HI/AVH.  He meets criteria for inpatient hospitalization for safety and stabilization.  Based on his blood sugar and medical needs, patient will be admitted to the medical unit and Psychiatry will be seeing him daily.  Dr Jama Flavors and this writer are in agreement with this plan of care and have advised Dr Waymon Amato, hospitalist our recommendations.  Past Psychiatric History: Generalized Anxiety  Disorder, Undifferentiated Schizophrenia, Schizoaffective disorder.  Interval history: Patient seen for psych consultation follow up. Patient complained increased appetite, uncontrollable blood sugars and asking for double portion of his meals. He stated that he is not sleeping well and asks to adjust his mediations. He has Recruitment consultant as he is trying to leave the hospital and IVC has been in place for him as per LCSW. Reviewed IVC papers in his chart which are current as of 12/21/2015. He states that medical doctors can give more insulin if needed. He has no intention to control his diet. Patient stated that nothing wrong with him but people are acting like him at [facility] which is making his agitated and anger out burst. He is non compliant with his medications which resulted emergence of psychosis like people in [employer] has been acting like him which he did not approve of it and does not know how to stop it which makes him anxious and agitated.   Reportedly he lives by himself in apartment and goes to [employer] to eat his meals.  His primary care physician has stopped prescribing his medications about a month ago and he has no plans of following up with PCP and psychiatrist in out patient care. Patient was found with elevated blood sugars on arrival but now they are better with BS of 300's. Patient has  A/V/H but denied paranoia. He wish to be admitted to [facility] for crisis stabilization, and medication management and making appropriate out patient psych referrals.   Dr. Waymon Amato is concern about controlling his psychosis, may be major factor for him not able to keep up with  insulin therapy and developing extremely elevated blood sugars. Will ask unit social service to refer him to in patient psych facility when medically stable.   Risk to Self: Is patient at risk for suicide?: No, but patient needs Medical Clearance Risk to Others:   Prior Inpatient Therapy:   Prior Outpatient Therapy:    Past Medical History:  Past Medical History  Diagnosis Date  . Depression   . Schizo affective schizophrenia ([name])   . Polysubstance abuse   . Scoliosis   . Chronic pain   . Noncompliance with medication regimen   . GERD (gastroesophageal reflux disease)   . Arthritis     "both hips" (12/21/2014)  . Asthma     Childhood  . Chicken pox   . Diabetes mellitus     Type 1 diabetes  . DKA (diabetic ketoacidoses) ([name]) 11/18/2014  . Hypertension     Past Surgical History  Procedure Laterality Date  . Hand surgery Left     Left thumb surgery   Family History:  Family History  Problem Relation Age of Onset  . Diabetes Mother    Family Psychiatric  History:  unknown Social History:  History  Alcohol Use No    Comment: 12/21/2014 "I drink on a rare occasion"     History  Drug Use  . Yes  . Special: Marijuana, Cocaine    Comment: crack cocaine    Social History   Social History  . Marital Status: Legally Separated    Spouse Name: N/A  . Number of Children: 0  . Years of Education: 13   Occupational History  . Disability    Social History Main Topics  . Smoking status: Current Every Day Smoker -- 1.00 packs/day for 7 years    Types: Cigarettes  . Smokeless tobacco: Never Used  . Alcohol Use: No     Comment: 12/21/2014 "I drink on a rare occasion"  . Drug Use: Yes    Special:  Marijuana, Cocaine     Comment: crack cocaine  . Sexual Activity: Yes   Other Topics Concern  . None   Social History Narrative   ** Merged History Encounter **    Fun: Video games      Additional Social History:     Allergies:   Allergies  Allergen Reactions  . Sulfa Antibiotics Other (See Comments)    Unknown - Childhood    Labs:  Results for orders placed or performed during the hospital encounter of 12/19/15 (from the past 48 hour(s))  Glucose, capillary     Status: Abnormal   Collection Time: 12/20/15  3:51 PM  Result Value Ref Range   Glucose-Capillary 200 (H) 65 - 99 mg/dL  Glucose, capillary     Status: Abnormal   Collection Time: 12/20/15 10:28 PM  Result Value Ref Range   Glucose-Capillary 345 (H) 65 - 99 mg/dL  Glucose, capillary     Status: Abnormal  Collection Time: 12/21/15  5:43 AM  Result Value Ref Range   Glucose-Capillary 302 (H) 65 - 99 mg/dL  Glucose, capillary     Status: Abnormal   Collection Time: 12/21/15  7:05 AM  Result Value Ref Range   Glucose-Capillary 324 (H) 65 - 99 mg/dL   Comment 1 Notify RN   Glucose, capillary     Status: Abnormal   Collection Time: 12/21/15 11:23 AM  Result Value Ref Range   Glucose-Capillary 220 (H) 65 - 99 mg/dL   Comment 1 Notify RN   Glucose, capillary     Status: Abnormal   Collection Time: 12/21/15  5:03 PM  Result Value Ref Range   Glucose-Capillary 293 (H) 65 - 99 mg/dL   Comment 1 Notify RN   Glucose, capillary     Status: Abnormal   Collection Time: 12/21/15  9:33 PM  Result Value Ref Range   Glucose-Capillary 138 (H) 65 - 99 mg/dL  Glucose, capillary     Status: Abnormal   Collection Time: 12/22/15  7:11 AM  Result Value Ref Range   Glucose-Capillary 366 (H) 65 - 99 mg/dL  Glucose, capillary     Status: Abnormal   Collection Time: 12/22/15 11:32 AM  Result Value Ref Range   Glucose-Capillary 129 (H) 65 - 99 mg/dL    Current Facility-Administered Medications  Medication Dose Route  Frequency Provider Last Rate Last Dose  . acetaminophen (TYLENOL) tablet 650 mg  650 mg Oral Q6H PRN Elease Etienne, MD   650 mg at 12/22/15 0301  . albuterol (PROVENTIL) (2.5 MG/3ML) 0.083% nebulizer solution 2.5 mg  2.5 mg Nebulization Q2H PRN Elease Etienne, MD      . ARIPiprazole (ABILIFY) tablet 5 mg  5 mg Oral Daily Earney Navy, NP   5 mg at 12/22/15 0949  . dextrose 50 % solution 25 mL  25 mL Intravenous PRN Elease Etienne, MD      . diphenhydrAMINE (BENADRYL) injection 12.5 mg  12.5 mg Intramuscular Q8H PRN Earney Navy, NP      . enoxaparin (LOVENOX) injection 40 mg  40 mg Subcutaneous Q24H Elease Etienne, MD   40 mg at 12/21/15 2012  . gi cocktail (Maalox,Lidocaine,Donnatal)  30 mL Oral TID PRN Rodolph Bong, MD   30 mL at 12/22/15 0949  . haloperidol lactate (HALDOL) injection 5 mg  5 mg Intramuscular Q8H PRN Earney Navy, NP   5 mg at 12/20/15 0347  . insulin aspart (novoLOG) injection 0-20 Units  0-20 Units Subcutaneous TID WC Rodolph Bong, MD   3 Units at 12/22/15 1258  . insulin aspart protamine- aspart (NOVOLOG MIX 70/30) injection 45 Units  45 Units Subcutaneous BID WC Elease Etienne, MD   45 Units at 12/22/15 0804  . ketorolac (TORADOL) 30 MG/ML injection 30 mg  30 mg Intravenous Q6H PRN Rodolph Bong, MD      . LORazepam (ATIVAN) tablet 1 mg  1 mg Oral Q8H PRN Earney Navy, NP   1 mg at 12/22/15 0301   Or  . LORazepam (ATIVAN) injection 1 mg  1 mg Intramuscular Q8H PRN Earney Navy, NP      . nicotine (NICODERM CQ - dosed in mg/24 hr) patch 7 mg  7 mg Transdermal Daily Elease Etienne, MD   7 mg at 12/22/15 0950  . pantoprazole (PROTONIX) EC tablet 40 mg  40 mg Oral Daily Rodolph Bong, MD   40  mg at 12/22/15 0949  . traMADol (ULTRAM) tablet 100 mg  100 mg Oral Q6H PRN Rodolph Bong, MD   100 mg at 12/22/15 1426  . traZODone (DESYREL) tablet 50 mg  50 mg Oral QHS Earney Navy, NP   50 mg at 12/21/15 2219     Musculoskeletal: Strength & Muscle Tone: seen lying down in bed Gait & Station: seen lying down in bed Patient leans: see above  Psychiatric Specialty Exam: Review of Systems  Constitutional: Negative.   HENT: Negative.   Eyes: Negative.   Respiratory: Negative.   Cardiovascular: Negative.   Gastrointestinal: Negative.   Genitourinary: Negative.   Musculoskeletal: Negative.   Skin: Negative.   Endo/Heme/Allergies: Positive for polydipsia (Diabestes type 1, uncontrolled ).    Blood pressure 104/53, pulse 98, temperature 97.9 F (36.6 C), temperature source Oral, resp. rate 18, SpO2 98 %.There is no weight on file to calculate BMI.  General Appearance: Casual and emaciated   Eye Contact::  Good  Speech:  Clear and Coherent and Slow  Volume:  Decreased  Mood:  Anxious  Affect:  Congruent, Depressed and Flat  Thought Process:  Coherent, Goal Directed and Intact  Orientation:  Full (Time, Place, and Person)  Thought Content:  WDL  Suicidal Thoughts:  No  Homicidal Thoughts:  No  Memory:  Immediate;   Good Recent;   Good Remote;   Good  Judgement:  Poor  Insight:  Fair  Psychomotor Activity:  Normal  Concentration:  Good  Recall:  Fair  Fund of Knowledge:Good  Language: Good  Akathisia:  No  Handed:  Right  AIMS (if indicated):     Assets:  Communication Skills Desire for Improvement Financial Resources/Insurance Housing Leisure Time Resilience  ADL's:  Intact  Cognition: WNL  Sleep:      Treatment Plan Summary: Daily contact with patient to assess and evaluate symptoms and progress in treatment, Medication management and Plan -we will get an EKG now-QTC interval   Case discussed with social service regarding acute psych placement needs Continue Ativan 1 mg po every 6 hours as needed for agitation Continue Haldol 5 mg  intramuscularly every 8 hours as needed for severe agitation.   Change Abilify 5mg  po Qhs for psychosis start 12/22/2015, titrate upto 20 mg  if needed clinically Increase Trazodone 100 mg po at bed time for sleep. Appreciate psychiatric consultation and follow up as clinically required Please contact [phone] or [phone] if needs further assistance  Disposition:   Continue Psychiatry consultation follow up as clinically required Unit social service will refer him for inpatient mental health facilities for crisis stabilization Patient does meet criteria for inpatient Psychiatric hospitalization.  02/19/2016  MD  12/22/2015 2:32 PM

## 2015-12-23 LAB — BASIC METABOLIC PANEL
ANION GAP: 6 (ref 5–15)
BUN: 22 mg/dL — ABNORMAL HIGH (ref 6–20)
CALCIUM: 8.9 mg/dL (ref 8.9–10.3)
CO2: 29 mmol/L (ref 22–32)
Chloride: 103 mmol/L (ref 101–111)
Creatinine, Ser: 0.48 mg/dL — ABNORMAL LOW (ref 0.61–1.24)
Glucose, Bld: 204 mg/dL — ABNORMAL HIGH (ref 65–99)
Potassium: 4.1 mmol/L (ref 3.5–5.1)
SODIUM: 138 mmol/L (ref 135–145)

## 2015-12-23 LAB — GLUCOSE, CAPILLARY
GLUCOSE-CAPILLARY: 241 mg/dL — AB (ref 65–99)
GLUCOSE-CAPILLARY: 286 mg/dL — AB (ref 65–99)
GLUCOSE-CAPILLARY: 260 mg/dL — AB (ref 65–99)
Glucose-Capillary: 141 mg/dL — ABNORMAL HIGH (ref 65–99)
Glucose-Capillary: 119 mg/dL — ABNORMAL HIGH (ref 65–99)
Glucose-Capillary: 162 mg/dL — ABNORMAL HIGH (ref 65–99)

## 2015-12-23 MED ORDER — INSULIN ASPART 100 UNIT/ML ~~LOC~~ SOLN
0.0000 [IU] | Freq: Three times a day (TID) | SUBCUTANEOUS | Status: DC
  Administered 2015-12-23: 1 [IU] via SUBCUTANEOUS
  Administered 2015-12-24: 7 [IU] via SUBCUTANEOUS

## 2015-12-23 MED ORDER — VITAMINS A & D EX OINT
TOPICAL_OINTMENT | CUTANEOUS | Status: AC
  Filled 2015-12-23: qty 5

## 2015-12-23 MED ORDER — INSULIN ASPART PROT & ASPART (70-30 MIX) 100 UNIT/ML ~~LOC~~ SUSP
48.0000 [IU] | Freq: Two times a day (BID) | SUBCUTANEOUS | Status: DC
  Administered 2015-12-23 – 2015-12-24 (×2): 48 [IU] via SUBCUTANEOUS
  Filled 2015-12-23: qty 10

## 2015-12-23 MED ORDER — INSULIN ASPART 100 UNIT/ML ~~LOC~~ SOLN: 0.0000 [IU] | Freq: Every day | SUBCUTANEOUS | Status: DC

## 2015-12-23 NOTE — Progress Notes (Signed)
Inpatient Diabetes Program Recommendations  AACE/ADA: New Consensus Statement on Inpatient Glycemic Control (2015)  Target Ranges:  Prepandial:   less than 140 mg/dL      Peak postprandial:   less than 180 mg/dL (1-2 hours)      Critically ill patients:  140 - 180 mg/dL   Review of Glycemic Control Results for [name], [name] (MRN [id number]) as of 12/23/2015 09:39  Ref. Range 12/22/2015 16:22 12/22/2015 20:25 12/22/2015 21:21 12/23/2015 05:47 12/23/2015 07:58  Glucose-Capillary Latest Ref Range: 65-99 mg/dL 329 (H) 58 (L) 924 (H) 241 (H) 286 (H)   Hypoglycemia after Novolog 11 units for correction given. Adjust correction scale.  Inpatient Diabetes Program Recommendations:    Decrease Novolog to sensitive tidwc and hs since pt is Type 1 and sensitive to insulin.  Will continue to follow. Thank you. Ailene Ards, RD, LDN, CDE Inpatient Diabetes Coordinator [phone]

## 2015-12-23 NOTE — Progress Notes (Signed)
Date: December 23, 2015 Chart reviewed for concurrent status and case management needs. Will continue to follow patient for changes and needs: Rhonda Davis, RN, BSN, CCM   [phone] 

## 2015-12-23 NOTE — BH Assessment (Signed)
Patient has been accepted to [facility].  Accepting physician is Dr. Jennet Maduro.  Attending Physician will be Dr. Jennet Maduro.  Patient has been assigned to room 324, by [facility] Charge Nurse [place].  Call report to [phone].  Representative/Transfer Coordinator is Kandis Henry.  [facility] Staff Museum/gallery exhibitions officer, Child psychotherapist) made aware of acceptance.

## 2015-12-23 NOTE — Progress Notes (Signed)
[facility] PROGRESS NOTE  [name] LZJ:[id number] DOB: Mar 31, 1986 DOA: 12/19/2015 PCP: Jeanine Luz, FNP   HPI/Brief narrative 30 y.o. male , homeless from [employer], PMH of type I DM, schizophrenia and substance abuse [facility]), brought to the [facility] ED by EMS for complaints of hallucinations and homicidal ideations. He was found to be hyperglycemic with blood glucose of 894, not in DKA. Uncontrolled diabetes is most likely secondary to noncompliance with all his medications which is a direct complication of his social situation and psychiatric illness. Briefly placed on IV insulin drip per the glucose stabilizer protocol and then transitioned to 70/30 insulin's. Psychiatric consulted. Patient has been involuntarily committed and has a Recruitment consultant.    Assessment/Plan: #1 poorly controlled type 1 diabetes with hyperosmolarity Secondary to medical noncompliance. Initial blood glucose was 894 and admission. Patient was not in DKA as he had a serum bicarbonate level of 27 anion gap of 12 and a venous pH of 7.363. Patient was placed on the glucose stabilizer and has been subsequently transitioned to subcutaneous insulin. Hemoglobin A1c was 14.6. CBGs have ranged from 58 - 286. Will increase 70/30 has been increased to 48 units twice daily. Titrate 70/30 as needed. Change sliding scale insulin to sensitive scale. On discharge will need a prescription for Novolin Reli-on 70/30 insulin at time of d/c [Order #[id number]]. Patient can purchase this particular brand of 70/30 insulin at [facility] for $25 per vial.Follow.  #2 dehydration Resolved.  #3 pseudo-hyponatremia Secondary to dehydration and hyperglycemia. Improved.  #4 schizophrenia Patient is status post Geodon 20 mg IM 1 and Ativan 1 mg IV 1 in the ED. Patient was involuntarily committed by EDP and has a one-to-one Recruitment consultant. Will renew involuntary commitment papers. Patient has been seen by psychiatry on  consultation who recommended to continue Ativan 1 mg every 6 hours as needed for agitation, Haldol 5 mg IM every 8 hours as needed for severe agitation, Abilify, trazodone for sleep. Patient per psychiatry consultation will need inpatient psychiatric treatment. Psychiatry following.  #5 substance abuse/THC Social work following.  #6? Chronic left shoulder pain Patient states he's had this for a while. Patient asking for morphine. Patient states was on Tylenol No. 3 in the past however no significant improvement with this pain. Continue Ultram as needed.  #7 prophylaxis PPI for GI prophylaxis. Lovenox for DVT prophylaxis.  Code Status: Full Family Communication: Updated patient. No family present. Disposition Plan: Per psychiatry will need inpatient psychiatric admission once bed available.   Consultants:  Psychiatry: Dr. Elsie Saas 12/20/2015  Procedures:  None  Antibiotics:  None  HPI/Subjective: Patient denies any chest pain or shortness of breath. Patient denies any chest pain. No shortness of breath.  Objective: Filed Vitals:   12/23/15 0543 12/23/15 1446  BP: 108/71 111/68  Pulse: 92 104  Temp: 97.5 F (36.4 C) 98 F (36.7 C)  Resp: 20 20    Intake/Output Summary (Last 24 hours) at 12/23/15 1649 Last data filed at 12/23/15 1200  Gross per 24 hour  Intake    720 ml  Output      0 ml  Net    720 ml   Filed Weights   12/23/15 1008  Weight: 58.968 kg (130 lb)    Exam:   General:  NAD  Cardiovascular: RRR  Respiratory: CTAB  Abdomen: soft/NT/ND/+BS  Musculoskeletal: No c/c/e  Data Reviewed: Basic Metabolic Panel:  Recent Labs Lab 12/19/15 1341 12/19/15 1350 12/20/15 0520 12/23/15 0500  NA 129*  129* 135 138  K 4.8 4.8 3.8 4.1  CL 90* 90* 101 103  CO2 27  --  26 29  GLUCOSE 894* >700* 403* 204*  BUN 15 16 14  22*  CREATININE 1.15 1.00 0.52* 0.48*  CALCIUM 9.2  --  9.0 8.9   Liver Function Tests: No results for input(s): AST, ALT,  ALKPHOS, BILITOT, PROT, ALBUMIN in the last 168 hours. No results for input(s): LIPASE, AMYLASE in the last 168 hours. No results for input(s): AMMONIA in the last 168 hours. CBC:  Recent Labs Lab 12/19/15 1341 12/19/15 1350  WBC 7.3  --   HGB 14.4 16.0  HCT 42.1 47.0  MCV 89.8  --   PLT 218  --    Cardiac Enzymes: No results for input(s): CKTOTAL, CKMB, CKMBINDEX, TROPONINI in the last 168 hours. BNP (last 3 results) No results for input(s): BNP in the last 8760 hours.  ProBNP (last 3 results) No results for input(s): PROBNP in the last 8760 hours.  CBG:  Recent Labs Lab 12/22/15 2121 12/23/15 0547 12/23/15 0758 12/23/15 1136 12/23/15 1631  GLUCAP 147* 241* 286* 260* 141*    No results found for this or any previous visit (from the past 240 hour(s)).   Studies: No results found.  Scheduled Meds: . ARIPiprazole  5 mg Oral QHS  . enoxaparin (LOVENOX) injection  40 mg Subcutaneous Q24H  . insulin aspart  0-5 Units Subcutaneous QHS  . insulin aspart  0-9 Units Subcutaneous TID WC  . insulin aspart protamine- aspart  48 Units Subcutaneous BID WC  . nicotine  7 mg Transdermal Daily  . pantoprazole  40 mg Oral Daily  . traZODone  100 mg Oral QHS   Continuous Infusions:   Principal Problem:   Schizoaffective disorder, bipolar type ([name]) Active Problems:   Hyponatremia   Dehydration   Schizophrenia ([name])   DM hyperosmolarity type I, uncontrolled ([name])   Type I diabetes mellitus with hyperosmolarity ([name])   Hallucinations    Time spent: 28 MINS    [facility] MD [facility] Pager [phone]. If 7PM-7AM, please contact night-coverage at [email or web address], password [facility] 12/23/2015, 4:49 PM  LOS: 4 days

## 2015-12-23 NOTE — Clinical Social Work Note (Signed)
CSW spoke with [facility] at [facility]; per admissions pt isn't stable enough for admission today, possibly tomorrow. CSW will continue to follow.    Etta Quill, LCSW [phone] Hospital psychiatric & 5E, 5W 31-35 Licensed Clinical Social Worker

## 2015-12-23 NOTE — Clinical Social Work Note (Signed)
CSW consulted with Dr. Janee Morn and CSW director after pt was denied by [facility] today. Dr Jacky Kindle was made aware that pt was denied and will be reviewed by [facility] in the morning.    Etta Quill, LCSW [phone] Hospital psychiatric & 5E, 5W 31-35 Licensed Clinical Social Worker

## 2015-12-23 NOTE — Progress Notes (Signed)
CSW was contacted by counselor at 7:51pm stating that the patient has been accepted to [facility]. He states that the patient is welcomed to come tonight.    CSW made physician aware.   CSW reached out to magistrate for transportation. CSW spoke with [employer], who states that he will not be able to transport the patient tonight due to it being after hours. He states that transportation of involuntary committed patient's cannot exceed past 7:00pm, and that transportation can resume in the morning.   CSW reached out to counselor and made him aware that the patient will not be coming tonight due to transport. Counselor states that the patient will still have a bed tomorrow at [facility]. CSW reviewed patient's IVC paperwork, and it is still active.  CSW made nurse aware.  Trish Mage [phone] ED CSW 12/23/2015 11:51 PM

## 2015-12-24 ENCOUNTER — Inpatient Hospital Stay
Admission: EM | Admit: 2015-12-24 | Discharge: 2015-12-27 | DRG: 885 | Disposition: A | Discharge status: Home / Self Care | Payer: PPO | Source: Intra-hospital | Attending: Psychiatry | Admitting: Psychiatry

## 2015-12-24 ENCOUNTER — Encounter: Payer: Self-pay | Admitting: Psychiatry

## 2015-12-24 DIAGNOSIS — Z9114 Patient's other noncompliance with medication regimen: Secondary | ICD-10-CM | POA: Diagnosis not present

## 2015-12-24 DIAGNOSIS — M1990 Unspecified osteoarthritis, unspecified site: Secondary | ICD-10-CM | POA: Diagnosis present

## 2015-12-24 DIAGNOSIS — F203 Undifferentiated schizophrenia: Secondary | ICD-10-CM | POA: Diagnosis present

## 2015-12-24 DIAGNOSIS — R51 Headache: Secondary | ICD-10-CM | POA: Diagnosis present

## 2015-12-24 DIAGNOSIS — I10 Essential (primary) hypertension: Secondary | ICD-10-CM | POA: Diagnosis present

## 2015-12-24 DIAGNOSIS — E1069 Type 1 diabetes mellitus with other specified complication: Secondary | ICD-10-CM

## 2015-12-24 DIAGNOSIS — G4700 Insomnia, unspecified: Secondary | ICD-10-CM | POA: Diagnosis present

## 2015-12-24 DIAGNOSIS — F17210 Nicotine dependence, cigarettes, uncomplicated: Secondary | ICD-10-CM | POA: Diagnosis present

## 2015-12-24 DIAGNOSIS — Z79899 Other long term (current) drug therapy: Secondary | ICD-10-CM

## 2015-12-24 DIAGNOSIS — K5900 Constipation, unspecified: Secondary | ICD-10-CM | POA: Diagnosis present

## 2015-12-24 DIAGNOSIS — Z833 Family history of diabetes mellitus: Secondary | ICD-10-CM

## 2015-12-24 DIAGNOSIS — M419 Scoliosis, unspecified: Secondary | ICD-10-CM | POA: Diagnosis present

## 2015-12-24 DIAGNOSIS — J45909 Unspecified asthma, uncomplicated: Secondary | ICD-10-CM | POA: Diagnosis present

## 2015-12-24 DIAGNOSIS — K219 Gastro-esophageal reflux disease without esophagitis: Secondary | ICD-10-CM | POA: Diagnosis present

## 2015-12-24 DIAGNOSIS — Z9889 Other specified postprocedural states: Secondary | ICD-10-CM

## 2015-12-24 DIAGNOSIS — E109 Type 1 diabetes mellitus without complications: Secondary | ICD-10-CM | POA: Diagnosis present

## 2015-12-24 DIAGNOSIS — F909 Attention-deficit hyperactivity disorder, unspecified type: Secondary | ICD-10-CM | POA: Diagnosis present

## 2015-12-24 DIAGNOSIS — F17200 Nicotine dependence, unspecified, uncomplicated: Secondary | ICD-10-CM | POA: Diagnosis present

## 2015-12-24 DIAGNOSIS — E1065 Type 1 diabetes mellitus with hyperglycemia: Secondary | ICD-10-CM | POA: Diagnosis present

## 2015-12-24 DIAGNOSIS — Z794 Long term (current) use of insulin: Secondary | ICD-10-CM

## 2015-12-24 DIAGNOSIS — E870 Hyperosmolality and hypernatremia: Secondary | ICD-10-CM | POA: Diagnosis present

## 2015-12-24 DIAGNOSIS — J449 Chronic obstructive pulmonary disease, unspecified: Secondary | ICD-10-CM | POA: Diagnosis present

## 2015-12-24 DIAGNOSIS — Z882 Allergy status to sulfonamides status: Secondary | ICD-10-CM | POA: Diagnosis not present

## 2015-12-24 DIAGNOSIS — F259 Schizoaffective disorder, unspecified: Secondary | ICD-10-CM

## 2015-12-24 LAB — BASIC METABOLIC PANEL
Anion gap: 7 (ref 5–15)
BUN: 28 mg/dL — ABNORMAL HIGH (ref 6–20)
CALCIUM: 9.2 mg/dL (ref 8.9–10.3)
CHLORIDE: 99 mmol/L — AB (ref 101–111)
CO2: 30 mmol/L (ref 22–32)
CREATININE: 0.59 mg/dL — AB (ref 0.61–1.24)
GFR calc Af Amer: >60 mL/min (ref >60)
GFR calc non Af Amer: >60 mL/min (ref >60)
GLUCOSE: 294 mg/dL — AB (ref 65–99)
Potassium: 4.2 mmol/L (ref 3.5–5.1)
Sodium: 136 mmol/L (ref 135–145)

## 2015-12-24 LAB — GLUCOSE, CAPILLARY
Glucose-Capillary: 325 mg/dL — ABNORMAL HIGH (ref 65–99)
Glucose-Capillary: 93 mg/dL (ref 65–99)
Glucose-Capillary: 285 mg/dL — ABNORMAL HIGH (ref 65–99)
Glucose-Capillary: 192 mg/dL — ABNORMAL HIGH (ref 65–99)

## 2015-12-24 MED ORDER — LORAZEPAM 1 MG PO TABS: 1.0000 mg | ORAL_TABLET | Freq: Three times a day (TID) | ORAL | Status: DC | PRN

## 2015-12-24 MED ORDER — GLUCERNA SHAKE PO LIQD
237.0000 mL | Freq: Three times a day (TID) | ORAL | Status: DC
  Administered 2015-12-24 – 2015-12-27 (×8): 237 mL via ORAL

## 2015-12-24 MED ORDER — INSULIN ASPART 100 UNIT/ML ~~LOC~~ SOLN: 0.0000 [IU] | Freq: Three times a day (TID) | SUBCUTANEOUS | Status: DC

## 2015-12-24 MED ORDER — ACETAMINOPHEN 325 MG PO TABS: 650.0000 mg | ORAL_TABLET | Freq: Four times a day (QID) | ORAL | Status: DC | PRN

## 2015-12-24 MED ORDER — QUETIAPINE FUMARATE 100 MG PO TABS
100.0000 mg | ORAL_TABLET | Freq: Three times a day (TID) | ORAL | Status: DC
  Administered 2015-12-24 – 2015-12-26 (×6): 100 mg via ORAL
  Filled 2015-12-24 (×6): qty 1

## 2015-12-24 MED ORDER — MAGNESIUM HYDROXIDE 400 MG/5ML PO SUSP
30.0000 mL | Freq: Every day | ORAL | Status: DC | PRN
  Administered 2015-12-26: 30 mL via ORAL
  Filled 2015-12-24: qty 30

## 2015-12-24 MED ORDER — INSULIN ASPART 100 UNIT/ML ~~LOC~~ SOLN
0.0000 [IU] | Freq: Three times a day (TID) | SUBCUTANEOUS | Status: DC
  Administered 2015-12-24: 5 [IU] via SUBCUTANEOUS
  Administered 2015-12-25: 9 [IU] via SUBCUTANEOUS
  Administered 2015-12-25: 13 [IU] via SUBCUTANEOUS
  Filled 2015-12-24: qty 9

## 2015-12-24 MED ORDER — TRAZODONE HCL 100 MG PO TABS: 100.0000 mg | ORAL_TABLET | Freq: Every day | ORAL | Status: DC

## 2015-12-24 MED ORDER — ARIPIPRAZOLE 10 MG PO TABS
10.0000 mg | ORAL_TABLET | Freq: Every day | ORAL | Status: DC
  Administered 2015-12-24 – 2015-12-27 (×4): 10 mg via ORAL
  Filled 2015-12-24 (×4): qty 1

## 2015-12-24 MED ORDER — INSULIN ASPART PROT & ASPART (70-30 MIX) 100 UNIT/ML ~~LOC~~ SUSP
48.0000 [IU] | Freq: Two times a day (BID) | SUBCUTANEOUS | Status: DC
  Administered 2015-12-24 – 2015-12-25 (×2): 48 [IU] via SUBCUTANEOUS
  Filled 2015-12-24 (×2): qty 48

## 2015-12-24 MED ORDER — TRAMADOL HCL 50 MG PO TABS
50.0000 mg | ORAL_TABLET | Freq: Four times a day (QID) | ORAL | Status: DC | PRN
  Administered 2015-12-24 – 2015-12-26 (×5): 50 mg via ORAL
  Filled 2015-12-24 (×5): qty 1

## 2015-12-24 MED ORDER — NICOTINE 21 MG/24HR TD PT24
21.0000 mg | MEDICATED_PATCH | Freq: Every day | TRANSDERMAL | Status: DC
  Administered 2015-12-25 – 2015-12-27 (×3): 21 mg via TRANSDERMAL
  Filled 2015-12-24 (×3): qty 1

## 2015-12-24 MED ORDER — NICOTINE 7 MG/24HR TD PT24: 7.0000 mg | MEDICATED_PATCH | Freq: Every day | TRANSDERMAL | Status: DC

## 2015-12-24 MED ORDER — INSULIN NPH ISOPHANE & REGULAR (70-30) 100 UNIT/ML ~~LOC~~ SUSP: 48.0000 [IU] | Freq: Two times a day (BID) | SUBCUTANEOUS | Status: DC

## 2015-12-24 MED ORDER — ALUM & MAG HYDROXIDE-SIMETH 200-200-20 MG/5ML PO SUSP: 30.0000 mL | ORAL | Status: DC | PRN

## 2015-12-24 MED ORDER — ACETAMINOPHEN 325 MG PO TABS
650.0000 mg | ORAL_TABLET | Freq: Four times a day (QID) | ORAL | Status: DC | PRN
  Administered 2015-12-24 – 2015-12-26 (×4): 650 mg via ORAL
  Filled 2015-12-24 (×5): qty 2

## 2015-12-24 MED ORDER — ARIPIPRAZOLE 5 MG PO TABS: 5.0000 mg | ORAL_TABLET | Freq: Every day | ORAL | Status: DC

## 2015-12-24 MED ORDER — TRAZODONE HCL 100 MG PO TABS
200.0000 mg | ORAL_TABLET | Freq: Every evening | ORAL | Status: DC | PRN
  Administered 2015-12-24 – 2015-12-26 (×3): 200 mg via ORAL
  Filled 2015-12-24 (×3): qty 2

## 2015-12-24 NOTE — Discharge Summary (Addendum)
Physician Discharge Summary  [name]:[id number] DOB: [birth year] DOA: 12/19/2015  PCP: Jeanine Luz, FNP  Admit date: 12/19/2015 Discharge date: 12/24/2015  Time spent: Greater than 30 minutes  Recommendations for Outpatient Follow-up:  1. Patient is being discharged to [facility] for further inpatient psychiatric evaluation and management. 2. Patient will need prescriptions for newly started and old medications that he has run out of, prior to discharge from [facility]. 3. Jeanine Luz, FNP/PCP upon discharge from [facility] 4. Psychiatry: [facility] to arrange for out patient follow-up upon discharge  Discharge Diagnoses:  Principal Problem:   Schizoaffective disorder, bipolar type ([name]) Active Problems:   Hyponatremia   Dehydration   Schizophrenia ([name])   DM hyperosmolarity type I, uncontrolled ([name])   Type I diabetes mellitus with hyperosmolarity ([name])   Hallucinations   Discharge Condition: Improved & Stable  Diet recommendation: Heart healthy and diabetic diet.   Filed Weights   12/23/15 1008  Weight: 58.968 kg (130 lb)    History of present illness:  30 y.o. male , homeless from [employer], PMH of type I DM, schizophrenia and substance abuse (THC), brought to the [facility] ED by EMS for complaints of hallucinations and homicidal ideations. He was found to be hyperglycemic with blood glucose of 894, not in DKA. Uncontrolled diabetes is most likely secondary to noncompliance with all his medications which is a direct complication of his social situation and psychiatric illness. Briefly placed on IV insulin drip per the glucose stabilizer protocol and then transitioned to 70/30 insulin's. Psychiatric consulted. Patient has been involuntarily committed and has a Recruitment consultant.  Hospital Course:   Poorly controlled type I DM with hyperosmolarity - Initial blood glucose 894. No DKA  (venous pH 7.363, serum bicarbonate 27 and anion gap 12) - Secondary to medication/insulin noncompliance due to psychiatric issues - Briefly placed on IV insulin drip per the glucose stabilizer protocol. His CBGs quickly came down. He got a dose of Lantus 10 units at bedtime on 1/1 but this is very low maintenance requirement based on his prior noted needs in chart (has been on 70/30 insulin 40 units twice a day). Resumed 70/30 insulin 40 units twice a day.  - Patient has a voracious appetite and may have to titrate insulin's as needed. Added NovoLog SSI moderate sensitivity. Seroquel discontinued on admission due to side effect of hyperglycemia.  - As long as his underlying psychiatric ailment is not stabilized, this is likely to be a recurring problem. - Hemoglobin A1c on 11/05/15:14.6 - 70/30 insulin increased to 48 units twice a day. Recommend medical consultation as needed for adjustment of diabetic medications at the [facility]. He will need prescriptions for his insulins upon discharge from [facility]. Novolin Reli-on 70/30 insulin at time of d/c [Order #[id number]]. Patient can purchase this particular brand of 70/30 insulin at [facility] for approximately $25 per vial. - SSI only while inpatient at [facility] and DC only on 70/30 insulin at DC from [facility]. Called and discussed same with attending Psychiatrist at [facility].  Dehydration -  resolved   Hyponatremia - Secondary to dehydration and hyperglycemia (pseudohyponatremia) - resolved.   Schizoaffective disorder, bipolar type  - Patient is status post Geodon 20 mg IM 1 and Ativan 1 mg IV 1 in ED - He has been involuntarily committed by EDP and has 1:1 Recruitment consultant. - Psychiatric consultation and follow-up appreciated. Patient on PRN's: Ativan, Haldol and trazodone. Started Abilify 5  MG on 12/20/15  - At discharge, will continue when necessary Ativan for anxiety/agitation, Abilify and trazodone. Patient has not required much of Haldol or  Benadryl which will be discontinued.  - The psychiatric medications can be further evaluated and managed as deemed necessary at the [facility]  - He will need outpatient psychiatry follow-up upon discharge from [facility] which can be arranged upon discharge there.   Substance abuse/THC  ? Chronic left shoulder pain - States that he's had this for a long time. Keeps asking for morphine but advised him that he has to try simpler medications i.e. Tylenol or NSAIDs prior. No acute findings on exam.     Consultants:  Psychiatry  Procedures:  None  Antibiotics:  None   Discharge Exam:  Complaints:  "can I sign out AMA?". Denies complaints. No suicidal or homicidal ideations reported. As per nursing, no acute events. Awaiting transfer to [name] this morning. Fasting CBG this morning was 325 but he had already started eating.   Filed Vitals:   12/23/15 1008 12/23/15 1446 12/23/15 2050 12/24/15 0509  BP:  111/68 115/67 123/62  Pulse:  104 99 97  Temp:  98 F (36.7 C) 98.5 F (36.9 C) 97.7 F (36.5 C)  TempSrc:  Oral Oral Oral  Resp:  20 20 20   Height: 6\' 1"  (1.854 m)     Weight: 58.968 kg (130 lb)     SpO2:  100% 100% 98%    General exam: Moderately built and nourished young male seen sitting up comfortably in bed. Safety sitter at bedside .  Respiratory system: Clear. No increased work of breathing. Cardiovascular system: S1 & S2 heard, RRR. No JVD, murmurs, gallops, clicks or pedal edema. Gastrointestinal system: Abdomen is nondistended, soft and nontender. Normal bowel sounds heard. Central nervous system: Alert and oriented. No focal neurological deficits. Extremities: Symmetric 5 x 5 power. Left shoulder exam without acute findings and full range of movements. Psychiatry: Paranoid.   Discharge Instructions      Discharge Instructions    Activity as tolerated - No restrictions    Complete by:  As directed      Call MD for:  difficulty breathing, headache or visual  disturbances    Complete by:  As directed      Call MD for:  extreme fatigue    Complete by:  As directed      Call MD for:  persistant dizziness or light-headedness    Complete by:  As directed      Call MD for:  persistant nausea and vomiting    Complete by:  As directed      Call MD for:  severe uncontrolled pain    Complete by:  As directed      Call MD for:  temperature >100.4    Complete by:  As directed      Call MD for:    Complete by:  As directed   Hallucinations, suicidal or homicidal ideations.     Diet - low sodium heart healthy    Complete by:  As directed      Diet Carb Modified    Complete by:  As directed             Medication List    STOP taking these medications        amphetamine-dextroamphetamine 20 MG tablet  Commonly known as:  ADDERALL     QUEtiapine 100 MG tablet  Commonly known as:  SEROQUEL  TAKE these medications        acetaminophen 325 MG tablet  Commonly known as:  TYLENOL  Take 2 tablets (650 mg total) by mouth every 6 (six) hours as needed for mild pain, moderate pain, fever or headache (or Fever >/= 101).     ARIPiprazole 5 MG tablet  Commonly known as:  ABILIFY  Take 1 tablet (5 mg total) by mouth at bedtime.     insulin aspart 100 UNIT/ML injection  Commonly known as:  novoLOG  Inject 0-9 Units into the skin 3 (three) times daily with meals. CBG < 70: implement hypoglycemia protocol CBG 70 - 120: 0 units CBG 121 - 150: 1 unit CBG 151 - 200: 2 units CBG 201 - 250: 3 units CBG 251 - 300: 5 units CBG 301 - 350: 7 units CBG 351 - 400: 9 units CBG > 400: call MD.     insulin NPH-regular Human (70-30) 100 UNIT/ML injection  Commonly known as:  NOVOLIN 70/30 RELION  Inject 48 Units into the skin 2 (two) times daily with a meal.     LORazepam 1 MG tablet  Commonly known as:  ATIVAN  Take 1 tablet (1 mg total) by mouth every 8 (eight) hours as needed for anxiety (agitation.).     nicotine 7 mg/24hr patch  Commonly known as:   NICODERM CQ - dosed in mg/24 hr  Place 1 patch (7 mg total) onto the skin daily.     traZODone 100 MG tablet  Commonly known as:  DESYREL  Take 1 tablet (100 mg total) by mouth at bedtime.          The results of significant diagnostics from this hospitalization (including imaging, microbiology, ancillary and laboratory) are listed below for reference.    Significant Diagnostic Studies: No results found.  Microbiology: No results found for this or any previous visit (from the past 240 hour(s)).   Labs: Basic Metabolic Panel:  Recent Labs Lab 12/19/15 1341 12/19/15 1350 12/20/15 0520 12/23/15 0500 12/24/15 0511  NA 129* 129* 135 138 136  K 4.8 4.8 3.8 4.1 4.2  CL 90* 90* 101 103 99*  CO2 27  --  26 29 30   GLUCOSE 894* >700* 403* 204* 294*  BUN 15 16 14  22* 28*  CREATININE 1.15 1.00 0.52* 0.48* 0.59*  CALCIUM 9.2  --  9.0 8.9 9.2   Liver Function Tests: No results for input(s): AST, ALT, ALKPHOS, BILITOT, PROT, ALBUMIN in the last 168 hours. No results for input(s): LIPASE, AMYLASE in the last 168 hours. No results for input(s): AMMONIA in the last 168 hours. CBC:  Recent Labs Lab 12/19/15 1341 12/19/15 1350  WBC 7.3  --   HGB 14.4 16.0  HCT 42.1 47.0  MCV 89.8  --   PLT 218  --    Cardiac Enzymes: No results for input(s): CKTOTAL, CKMB, CKMBINDEX, TROPONINI in the last 168 hours. BNP: BNP (last 3 results) No results for input(s): BNP in the last 8760 hours.  ProBNP (last 3 results) No results for input(s): PROBNP in the last 8760 hours.  CBG:  Recent Labs Lab 12/23/15 0758 12/23/15 1136 12/23/15 1631 12/23/15 2048 12/23/15 2127  GLUCAP 286* 260* 141* 119* 162*       Signed:  Marcellus Scott, MD, FACP, FHM. [facility] Pager [phone]  If 7PM-7AM, please contact night-coverage [email or web address] Password [email or web address] 12/24/2015, 10:50 AM

## 2015-12-24 NOTE — Progress Notes (Signed)
Recreation Therapy Notes  Date: 01.06.17 Time: 3:00 pm Location: Craft Room  Group Topic: Coping Skills  Goal Area(s) Addresses:  Patient will participate in healthy coping skill.  Behavioral Response: Disruptive  Intervention: Coloring   Activity: Patients were given coloring sheets and instructed to color and think of what emotions they were feeling and what they were focused on.  Education: LRT educated patients on healthy coping skills.  Education Outcome: LRT requested for patient to leave before LRT educated group.   Clinical Observations/Feedback: Patient started coloring the coloring sheets. Patient then started coloring his cup and lid. LRT asked patient to coloring the coloring sheets and not his cups. Patient upset and threw marker in the box. Patient started ranting about the rules in the handbook. LRT asked patient to listen or to leave group. Patient stated, "What if I don't do either? What will you do then?" Patient stopped talking for a while. Later patient appeared to be talking to himself. Patient then stated to peer, "This music makes you want to bang your head against a wall." LRT told patient he could leave if he did not want to listen to the music. Patient replied, "Maybe you can leave the room, sweetheart." LRT went to get security who got patient's nurse and patient left group at approximately 3:33 pm. Patient did not return to group.  Jacquelynn Cree, LRT/CTRS 12/24/2015 4:49 PM

## 2015-12-24 NOTE — BHH Suicide Risk Assessment (Signed)
[name] Admission Suicide Risk Assessment   Nursing information obtained from:    Demographic factors:    Current Mental Status:    Loss Factors:    Historical Factors:    Risk Reduction Factors:    Total Time spent with patient: 1 hour Principal Problem: Undifferentiated schizophrenia ([name]) Diagnosis:   Patient Active Problem List   Diagnosis Date Noted  . Tobacco use disorder [F17.200] 12/24/2015  . DM hyperosmolarity type I, uncontrolled ([name]) [E10.69, E10.65] 12/19/2015  . Type I diabetes mellitus with hyperosmolarity ([name]) [E10.69, E10.65] 12/19/2015  . Left shoulder pain [M25.512] 08/19/2015  . Generalized headache [R51] 07/22/2015  . Bilateral hip bursitis [M70.71, M70.72] 05/26/2015  . Depression [F32.9]   . Generalized anxiety disorder [F41.1] 03/29/2015  . Undifferentiated schizophrenia ([name]) [F20.3]   . Hip pain, bilateral [M25.551, M25.552] 11/18/2014  . Hyponatremia [E87.1] 06/17/2012  . Dehydration [E86.0] 06/17/2012     Continued Clinical Symptoms:  Alcohol Use Disorder Identification Test Final Score (AUDIT): 1 The "Alcohol Use Disorders Identification Test", Guidelines for Use in Primary Care, Second Edition.  World Science writer [name]). Score between 0-7:  no or low risk or alcohol related problems. Score between 8-15:  moderate risk of alcohol related problems. Score between 16-19:  high risk of alcohol related problems. Score 20 or above:  warrants further diagnostic evaluation for alcohol dependence and treatment.   CLINICAL FACTORS:   Schizophrenia:   Paranoid or undifferentiated type   Musculoskeletal: Strength & Muscle Tone: within normal limits Gait & Station: normal Patient leans: N/A  Psychiatric Specialty Exam: I reviewed physical exam performed on the medical floor and agree with the findings. Physical Exam  Nursing note and vitals reviewed.   Review of Systems  All other systems reviewed and are negative.   Blood pressure 117/78,  pulse 107, temperature 98 F (36.7 C), temperature source Oral, resp. rate 18, height 6\' 1"  (1.854 m), weight 65.772 kg (145 lb), SpO2 100 %.Body mass index is 19.13 kg/(m^2).  General Appearance: Casual  Eye Contact::  Good  Speech:  Clear and Coherent  Volume:  Normal  Mood:  Euthymic  Affect:  Appropriate  Thought Process:  Goal Directed  Orientation:  Full (Time, Place, and Person)  Thought Content:  WDL  Suicidal Thoughts:  No  Homicidal Thoughts:  No  Memory:  Immediate;   Fair Recent;   Fair Remote;   Fair  Judgement:  Impaired  Insight:  Shallow  Psychomotor Activity:  Normal  Concentration:  Fair  Recall:  [id number] of Knowledge:Fair  Language: Fair  Akathisia:  No  Handed:  Right  AIMS (if indicated):     Assets:  Communication Skills Desire for Improvement Financial Resources/Insurance Housing Resilience  Sleep:     Cognition: WNL  ADL's:  Intact     COGNITIVE FEATURES THAT CONTRIBUTE TO RISK:  None    SUICIDE RISK:   Mild:  Suicidal ideation of limited frequency, intensity, duration, and specificity.  There are no identifiable plans, no associated intent, mild dysphoria and related symptoms, good self-control (both objective and subjective assessment), few other risk factors, and identifiable protective factors, including available and accessible social support.  PLAN OF CARE: [name] admission, medication management, discharge planning.  Medical Decision Making:  New problem, with additional work up planned, Review of Psycho-Social Stressors (1), Review or order clinical lab tests (1), Review of Medication Regimen & Side Effects (2) and Review of New Medication or Change in Dosage (2)   [name]  is a 30 year old male with a history of schizoaffective disorder and type 1 diabetes transferred from medical floor of [name] where he was hospitalized for diabetes ketoacidosis in the context of insulin noncompliance. The patient was also found  psychotic and required further stabilization.  1. Psychosis. The patient was restarted on Abilify at [facility] with hopes that he will agree to injectable improve compliance. Unfortunately, he does not want to take it. He feels that Seroquel was working best for him. He was also prescribed Adderall by his primary care provider. T will no longer be available. I will start Seroquel 100 mg 3 times a day. This is the patient likes it.  2. Insomnia. He responds well to trazodone.  3. Diabetes. The patient is on 70/30 NovoLog twice daily. He has ADD diet and sliding scale insulin while in the [name]. SSI is NOT to be continued at home. The patient tells me that he runs out of money last week of the month and is unable to buy food or insulin.  4. ADD diet. The patient requests more forward. Dietary service was consulted. He has not Glucerna available with meals as well as increased portions of protein.   5. Smoking. Nicotine patch is available.  6. Disposition. He will be discharged to home. He refuses to follow up with a psychiatrist.  I certify that inpatient services furnished can reasonably be expected to improve the patient's condition.   [name] 12/24/2015, 1:28 PM

## 2015-12-24 NOTE — Progress Notes (Signed)
CSW confirmed with Jerilynn Som at [facility] that they still have a bed available for patient. RN, Abby cleared with Dr. Waymon Amato that patient is still medically cleared to go. IVC paperwork faxed to [facility] & transportation has been called.    Lincoln Maxin, LCSW [facility] Clinical Social Worker cell #: [phone]

## 2015-12-24 NOTE — Progress Notes (Addendum)
Inpatient Diabetes Program Recommendations  AACE/ADA: New Consensus Statement on Inpatient Glycemic Control (2015)  Target Ranges:  Prepandial:   less than 140 mg/dL      Peak postprandial:   less than 180 mg/dL (1-2 hours)      Critically ill patients:  140 - 180 mg/dL    Results for ROBIE, MCNIEL (MRN [id number]) as of 12/24/2015 13:19  Ref. Range 12/23/2015 05:47 12/23/2015 07:58 12/23/2015 11:36 12/23/2015 16:31 12/23/2015 20:48 12/23/2015 21:27  Glucose-Capillary Latest Ref Range: 65-99 mg/dL 628 (H) 315 (H) 176 (H) 141 (H) 119 (H) 162 (H)    Results for JAYCEE, PELZER (MRN [id number]) as of 12/24/2015 13:19  Ref. Range 12/24/2015 07:42 12/24/2015 12:09  Glucose-Capillary Latest Ref Range: 65-99 mg/dL 269 (H) 93    Admit with: Hyperglycemia/ Schizoaffective disorder  History: Type 1 DM  Home DM Meds: 70/30 insulin- 48 units bidwc (per notes, patient not taking insulin- ran out several days prior to admission)  Current Insulin Orders: 70/30 insulin- 48 units bidwc  Novolog Sensitive SSI (0-9 units) TID AC      -Patient is well known to the Inpatient Glycemic Control Team. He has multiple admissions and ED visits since 11/2014 (for both Psych issues and Hyperglycemia, Running out of insulin, etc).  -Not sure what else to do for this patient. He will take his insulin intermittently at home and note that patient has a history of psychiatric illness.  -Note patient has Psychiatric illness and has been requesting/eating lots of extra food which is making it difficult to manage his blood sugars.  -Patient has claimed in the past he cannot afford his insulin. Please provide patient with a Rx for Novolin Reli-on 70/30 insulin at time of d/c [Order #[id number]]. Patient can purchase this particular brand of 70/30 insulin at [facility] for $25 per vial.  MD- Note patient transferred to [facility] today.  Home 70/30 insulin ordered to start at  supper tonight and patient also started on Novolog Sensitive SSI.     --Will follow patient during hospitalization--  Ambrose Finland RN, MSN, CDE Diabetes Coordinator Inpatient Glycemic Control Team Team Pager: [phone] (8a-5p)'

## 2015-12-24 NOTE — Tx Team (Signed)
Initial Interdisciplinary Treatment Plan   PATIENT STRESSORS: Health problems Medication change or noncompliance Substance abuse   PATIENT STRENGTHS: Ability for insight Average or above average intelligence Capable of independent living General fund of knowledge   PROBLEM LIST: Problem List/Patient Goals Date to be addressed Date deferred Reason deferred Estimated date of resolution  Depressed Mood      Substance Abuse      Medication Noncompliance      Suicide Risk      Homelessness                               DISCHARGE CRITERIA:  Ability to meet basic life and health needs Adequate post-discharge living arrangements Improved stabilization in mood, thinking, and/or behavior Medical problems require only outpatient monitoring Motivation to continue treatment in a less acute level of care Need for constant or close observation no longer present Safe-care adequate arrangements made Verbal commitment to aftercare and medication compliance  PRELIMINARY DISCHARGE PLAN: Attend aftercare/continuing care group Outpatient therapy Placement in alternative living arrangements  PATIENT/FAMIILY INVOLVEMENT: This treatment plan has been presented to and reviewed with the patient, [name].  The patient and family have been given the opportunity to ask questions and make suggestions.  Lauris Poag 12/24/2015, 1:19 PM

## 2015-12-24 NOTE — Progress Notes (Signed)
Pt transferred to [facility] by [facility] department. AO x4. Pt belongings with sheriffs. Reported called to Sue Lush, Charity fundraiser. No questions or concerns at this time.  Macdonald Rigor W Feras Gardella , RN

## 2015-12-24 NOTE — BHH Group Notes (Signed)
[facility] LCSW Group Therapy   12/24/2015 1:15 PM   Type of Therapy: Group Therapy   Participation Level: Minimal   Participation Quality: Attentive and Sharing  Affect: Depressed and Flat   Cognitive: Alert and Oriented   Insight: Developing/Improving and Engaged   Engagement in Therapy: Developing/Improving and Engaged   Modes of Intervention: Clarification, Confrontation, Discussion, Education, Exploration, Limit-setting, Orientation, Problem-solving, Rapport Building, Dance movement psychotherapist, Socialization and Support   Summary of Progress/Problems: The topic for today was feelings about relapse. Pt discussed what relapse prevention is to them and identified triggers that they are on the path to relapse. Pt processed their feeling towards relapse and was able to relate to peers. Pt discussed coping skills that can be used for relapse prevention.  Pt initially was reticent to share and made sarcastic comments when prompted by the CSW.  As the session progressed the pt would leave the room, return and then leave again.  Finally after the pt's final return to the room fifteen minutes prior to the session's ending, the pt began to share at length and discussed how he had always coped with stressors bu using food and money to change the way he felt and that practicing gratitude was a positive way he had coped with negative feelings in the past.  Pt was polite and cooperative with the CSW and other group members and focused and attentive to the topics discussed and the sharing of others.    [name]. [name], MSW, LCSWA, LCAS

## 2015-12-24 NOTE — Progress Notes (Signed)
Patient ID: [name], male   DOB: [birth year], 30 y.o.   MRN: [id number]  Pt admitted from [facility] medical unit after being treated for hyperglycemia, pt was IVC'd on 1/3 for continued delusions/AH and "seeing actors trying to get in his head" per IVC papers, skin/contraband search done, skin intact no contraband found, pt states that "[name] changed my medications"--MD notified of patient concern, denies SI/HI/AVH, pleasant and cooperative throughout the admission process, skin/contraband search done, skin intact, no contraband found, oriented pt to unit and rules.

## 2015-12-24 NOTE — H&P (Signed)
Psychiatric Admission Assessment Adult  Patient Identification: [name] MRN:  [id number] Date of Evaluation:  12/24/2015 Chief Complaint:  Schizophrenia Principal Diagnosis: Undifferentiated schizophrenia ([name]) Diagnosis:   Patient Active Problem List   Diagnosis Date Noted  . Tobacco use disorder [F17.200] 12/24/2015  . DM hyperosmolarity type I, uncontrolled ([name]) [E10.69, E10.65] 12/19/2015  . Type I diabetes mellitus with hyperosmolarity ([name]) [E10.69, E10.65] 12/19/2015  . Left shoulder pain [M25.512] 08/19/2015  . Generalized headache [R51] 07/22/2015  . Bilateral hip bursitis [M70.71, M70.72] 05/26/2015  . Depression [F32.9]   . Generalized anxiety disorder [F41.1] 03/29/2015  . Undifferentiated schizophrenia ([name]) [F20.3]   . Hip pain, bilateral [M25.551, M25.552] 11/18/2014  . Hyponatremia [E87.1] 06/17/2012  . Dehydration [E86.0] 06/17/2012   History of Present Illness:  Identifying data. [name] is a 30 year old male with a history of schizoaffective disorder and poorly controlled type 1 diabetes.  Chief complaint. "I am not a danger to myself or anybody else."  History of present illness. Information was obtained from the patient and the chart. [name] has a long history of depression, anxiety, psychosis, and mood instability with several psychiatric hospitalizations. He was admitted to [facility] in April 2016 for a psychotic break but did not follow-up with mental health professionals. He claims that his trazodone and Seroquel as well as other have been prescribed by his primary care provider. He has no intention to follow-up with a psychiatrist. He was admitted to [facility] at the beginning of January with severe hyperglycemia. The patient explains that he always runs out of money on the last week of the month. He has no money to buy food or insulin. He tells me that his insurance does not pay for enough insulin. He has multiple admissions for  ketoacidosis. Prior to admission the patient reports that he was getting in trouble at the soup kitchen for agitated behavior. During recent hospitalization he was found psychotic and was transferred to [facility] for further stabilization.The patient adamantly denies any symptoms of depression or psychosis. He denies symptoms suggestive of bipolar mania. He denies feeling suicidal or homicidal. He reports severe anxiety and is asking for Xanax. He also reports diagnosis of ADHD for which she enjoys taking Adderall. He denies alcohol or illicit substance use.  Past psychiatric history. He was diagnosed with schizoaffective disorder at the age of 7. In the past he's been tried on numerous medications including injectable Risperdal but did not like any of them and feels that Seroquel works best for him. He denies history of substance use but there is information in the chart he's been drinking and using cocaine at some point. He denies ever attempting suicide.  Family psychiatric history. The patient doesn't stay in touch with his family except for his mother and has no information.  Social history. He is originally from [place] where his mother still resides. He moved to [place] because it is cheaper here. She graduated from high school and went to college some. He is now disabled from mental illness. He has health insurance. He lives independently in an apartment in [place]. He has hard time paying his rent, electricity, phone and Internet bill and frequently runs out of money.    Total Time spent with patient: 1 hour  Past Psychiatric History:Schizoaffective disorder.  Risk to Self: Is patient at risk for suicide?: Yes Risk to Others:   Prior Inpatient Therapy:   Prior Outpatient Therapy:    Alcohol Screening: 1. How often  do you have a drink containing alcohol?: Monthly or less 2. How many drinks containing alcohol do you have on a typical day when you are  drinking?: 1 or 2 3. How often do you have six or more drinks on one occasion?: Never Preliminary Score: 0 9. Have you or someone else been injured as a result of your drinking?: No 10. Has a relative or friend or a doctor or another health worker been concerned about your drinking or suggested you cut down?: No Alcohol Use Disorder Identification Test Final Score (AUDIT): 1 Brief Intervention: AUDIT score less than 7 or less-screening does not suggest unhealthy drinking-brief intervention not indicated Substance Abuse History in the last 12 months:  Yes.   Consequences of Substance Abuse: Negative Previous Psychotropic Medications: Yes  Psychological Evaluations: No  Past Medical History:  Past Medical History  Diagnosis Date  . Depression   . Schizo affective schizophrenia ([name])   . Polysubstance abuse   . Scoliosis   . Chronic pain   . Noncompliance with medication regimen   . GERD (gastroesophageal reflux disease)   . Arthritis     "both hips" (12/21/2014)  . Asthma     Childhood  . Chicken pox   . Diabetes mellitus     Type 1 diabetes  . DKA (diabetic ketoacidoses) ([name]) 11/18/2014  . Hypertension     Past Surgical History  Procedure Laterality Date  . Hand surgery Left     Left thumb surgery   Family History:  Family History  Problem Relation Age of Onset  . Diabetes Mother    Family Psychiatric  History: Unknown.  Social History:  History  Alcohol Use No    Comment: 12/21/2014 "I drink on a rare occasion"     History  Drug Use  . Yes  . Special: Marijuana, Cocaine    Comment: crack cocaine    Social History   Social History  . Marital Status: Legally Separated    Spouse Name: N/A  . Number of Children: 0  . Years of Education: 13   Occupational History  . Disability    Social History Main Topics  . Smoking status: Current Every Day Smoker -- 1.00 packs/day for 7 years    Types: Cigarettes  . Smokeless tobacco: Never Used  . Alcohol Use: No      Comment: 12/21/2014 "I drink on a rare occasion"  . Drug Use: Yes    Special: Marijuana, Cocaine     Comment: crack cocaine  . Sexual Activity: Yes   Other Topics Concern  . None   Social History Narrative   ** Merged History Encounter **    Fun: Video games      Additional Social History:                         Allergies:   Allergies  Allergen Reactions  . Sulfa Antibiotics Other (See Comments)    Unknown - Childhood   Lab Results:  Results for orders placed or performed during the hospital encounter of 12/24/15 (from the past 48 hour(s))  Glucose, capillary     Status: None   Collection Time: 12/24/15 12:09 PM  Result Value Ref Range   Glucose-Capillary 93 65 - 99 mg/dL    Metabolic Disorder Labs:  Lab Results  Component Value Date   HGBA1C 14.6* 11/05/2015   MPG 372 11/05/2015   MPG 295 06/01/2015   No results found for: PROLACTIN Lab  Results  Component Value Date   CHOL 143 08/20/2011   TRIG 110 08/20/2011   HDL 42 08/20/2011   CHOLHDL 3.4 08/20/2011   VLDL 22 08/20/2011   LDLCALC 79 08/20/2011    Current Medications: Current Facility-Administered Medications  Medication Dose Route Frequency Provider Last Rate Last Dose  . acetaminophen (TYLENOL) tablet 650 mg  650 mg Oral Q6H PRN Nicholle Falzon B Maisee Vollman, MD      . alum & mag hydroxide-simeth (MAALOX/MYLANTA) 200-200-20 MG/5ML suspension 30 mL  30 mL Oral Q4H PRN Vladislav Axelson B Areona Homer, MD      . ARIPiprazole (ABILIFY) tablet 10 mg  10 mg Oral Daily Carly Applegate B Donn Zanetti, MD   10 mg at 12/24/15 1307  . feeding supplement (GLUCERNA SHAKE) (GLUCERNA SHAKE) liquid 237 mL  237 mL Oral TID Hamish Banks B Garlen Reinig, MD      . insulin aspart (novoLOG) injection 0-9 Units  0-9 Units Subcutaneous TID WC Tiffaney Heimann B Jalaysha Skilton, MD      . insulin aspart protamine- aspart (NOVOLOG MIX 70/30) injection 48 Units  48 Units Subcutaneous BID WC Jazlyne Gauger B Tayven Renteria, MD      . magnesium hydroxide (MILK OF MAGNESIA) suspension  30 mL  30 mL Oral Daily PRN Shari Prows, MD      . Melene Muller ON 12/25/2015] nicotine (NICODERM CQ - dosed in mg/24 hours) patch 21 mg  21 mg Transdermal Q0600 Prabhleen Montemayor B Harriet Sutphen, MD      . traZODone (DESYREL) tablet 200 mg  200 mg Oral QHS PRN Thersia Petraglia B Joycie Aerts, MD       PTA Medications: Prescriptions prior to admission  Medication Sig Dispense Refill Last Dose  . acetaminophen (TYLENOL) 325 MG tablet Take 2 tablets (650 mg total) by mouth every 6 (six) hours as needed for mild pain, moderate pain, fever or headache (or Fever >/= 101).     . ARIPiprazole (ABILIFY) 5 MG tablet Take 1 tablet (5 mg total) by mouth at bedtime.     . insulin aspart (NOVOLOG) 100 UNIT/ML injection Inject 0-9 Units into the skin 3 (three) times daily with meals. CBG < 70: implement hypoglycemia protocol CBG 70 - 120: 0 units CBG 121 - 150: 1 unit CBG 151 - 200: 2 units CBG 201 - 250: 3 units CBG 251 - 300: 5 units CBG 301 - 350: 7 units CBG 351 - 400: 9 units CBG > 400: call MD.     . insulin NPH-regular Human (NOVOLIN 70/30 RELION) (70-30) 100 UNIT/ML injection Inject 48 Units into the skin 2 (two) times daily with a meal.     . LORazepam (ATIVAN) 1 MG tablet Take 1 tablet (1 mg total) by mouth every 8 (eight) hours as needed for anxiety (agitation.).     [name] nicotine (NICODERM CQ - DOSED IN MG/24 HR) 7 mg/24hr patch Place 1 patch (7 mg total) onto the skin daily.     . traZODone (DESYREL) 100 MG tablet Take 1 tablet (100 mg total) by mouth at bedtime.       Musculoskeletal: Strength & Muscle Tone: within normal limits Gait & Station: normal Patient leans: N/A  Psychiatric Specialty Exam: Physical Exam  Nursing note and vitals reviewed.   Review of Systems  All other systems reviewed and are negative.   Blood pressure 117/78, pulse 107, temperature 98 F (36.7 C), temperature source Oral, resp. rate 18, height 6\' 1"  (1.854 m), weight 65.772 kg (145 lb), SpO2 100 %.Body mass index is 19.13  kg/(m^2).  See  SRA.                                                  Sleep:        Treatment Plan Summary: Daily contact with patient to assess and evaluate symptoms and progress in treatment and Medication management   Mr. Foppiano is a 30 year old male with a history of schizoaffective disorder and type 1 diabetes transferred from medical floor of [facility] where he was hospitalized for diabetes ketoacidosis in the context of insulin noncompliance. The patient was also found psychotic and required further stabilization.  1. Psychosis. The patient was restarted on Abilify at [facility] with hopes that he will agree to injectable improve compliance. Unfortunately, he does not want to take it. He feels that Seroquel was working best for him. He was also prescribed Adderall by his primary care provider. T will no longer be available. I will start Seroquel 100 mg 3 times a day. This is the way patient likes it.  2. Insomnia. He responds well to trazodone.  3. Diabetes. The patient is on 70/30 NovoLog twice daily. He has ADD diet and sliding scale insulin while in the hospital. SSI is NOT to be continued at home. The patient tells me that he runs out of money last week of the month and is unable to buy food or insulin.  4. ADD diet. The patient requests more forward. Dietary service was consulted. He has not Glucerna available with meals as well as increased portions of protein.   5. Smoking. Nicotine patch is available.  6. Disposition. He will be discharged to home. He refuses to follow up with a psychiatrist.   Observation Level/Precautions:  15 minute checks  Laboratory:  CBC Chemistry Profile UDS UA  Psychotherapy:    Medications:    Consultations:    Discharge Concerns:    Estimated LOS:  Other:     I certify that inpatient services furnished can reasonably be expected to improve the patient's condition.   Arriana Lohmann 1/6/20171:36 PM

## 2015-12-24 NOTE — Progress Notes (Signed)
Initial Nutrition Assessment  DOCUMENTATION CODES:      INTERVENTION:  Meals and snacks: Recommend double portion of protein foods and nonstarchy vegetables for additional food items. Pt able to receive snacks in unit.  Medical Nutrition Supplement: Will add glucerna TID for additional nutrition (lower in carbs) Coordination of care: Please reconsult if further nutrition intervention needed.  NUTRITION DIAGNOSIS:    (none at this time) related to   as evidenced by  .    GOAL:   Patient will meet greater than or equal to 90% of their needs    MONITOR:    (Energy intake, glucose profile)  REASON FOR ASSESSMENT:   Consult    ASSESSMENT:      Pt admitted with hyperglycemia/schizoaffective disorder Past Medical History  Diagnosis Date  . Depression   . Schizo affective schizophrenia ([name])   . Polysubstance abuse   . Scoliosis   . Chronic pain   . Noncompliance with medication regimen   . GERD (gastroesophageal reflux disease)   . Arthritis     "both hips" (12/21/2014)  . Asthma     Childhood  . Chicken pox   . Diabetes mellitus     Type 1 diabetes  . DKA (diabetic ketoacidoses) ([name]) 11/18/2014  . Hypertension     Current Nutrition: eating well and demanding additional food items. Pt currently in group and discussed with RN  Food/Nutrition-Related History: normal   Scheduled Medications:  . ARIPiprazole  10 mg Oral Daily  . feeding supplement (GLUCERNA SHAKE)  237 mL Oral TID  . insulin aspart  0-9 Units Subcutaneous TID WC  . insulin aspart protamine- aspart  48 Units Subcutaneous BID WC  . [START ON 12/25/2015] nicotine  21 mg Transdermal Q0600       Electrolyte/Renal Profile and Glucose Profile:   Recent Labs Lab 12/20/15 0520 12/23/15 0500 12/24/15 0511  NA 135 138 136  K 3.8 4.1 4.2  CL 101 103 99*  CO2 26 29 30   BUN 14 22* 28*  CREATININE 0.52* 0.48* 0.59*  CALCIUM 9.0 8.9 9.2  GLUCOSE 403* 204* 294*    Gastrointestinal  Profile: Last BM: WDL   Weight Change: 9% wt loss in the last 7 months    Diet Order:  Diet Carb Modified Fluid consistency:: Thin; Room service appropriate?: Yes  Skin:   reveiwed      Height:   Ht Readings from Last 1 Encounters:  12/24/15 6\' 1"  (1.854 m)    Weight:   Wt Readings from Last 1 Encounters:  12/24/15 145 lb (65.772 kg)    Ideal Body Weight:     BMI:  Body mass index is 19.13 kg/(m^2).  EDUCATION NEEDS:   No education needs identified at this time    Rosselyn Martha B. , RD, LDN [phone] (pager) Weekend/On-Call pager [phone])

## 2015-12-24 NOTE — Progress Notes (Signed)
Pt pacing hallways, increasingly agitated due to the fact that spirituality groups is being held and pt states "I am a Satanist and I should not be kept from going into the dayroom because a pastor is preaching, I am being patronized", Consulting civil engineer and MD notified, reeducated pt of the rules about dayroom and phones being off during groups, pt demanding to call the Director of the unit, allowed pt to make phone call, pt still pacing hallways, MD went to talk with patient, pt states to MD "I want to leave I don't need to be here, I was brought in against my will, so how about I kidnap your daughters and hold them against there will until the start acting right, when I leave here I am going to have your licenses revoked", attempted to de-escalate pt, pt still agitated and pacing halls.

## 2015-12-24 NOTE — Progress Notes (Signed)
   12/24/15 1530  Clinical Encounter Type  Visited With Patient not available  Visit Type Initial  Referral From Nurse  Consult/Referral To Nurse  Patient not available for chaplain consult. Chap. Ashley Montminy G. Jaquavius Hudler, ext. [phone]

## 2015-12-25 ENCOUNTER — Encounter: Payer: Self-pay | Admitting: Internal Medicine

## 2015-12-25 LAB — GLUCOSE, CAPILLARY
GLUCOSE-CAPILLARY: 443 mg/dL — AB (ref 65–99)
GLUCOSE-CAPILLARY: 517 mg/dL — AB (ref 65–99)
Glucose-Capillary: 229 mg/dL — ABNORMAL HIGH (ref 65–99)
Glucose-Capillary: 56 mg/dL — ABNORMAL LOW (ref 65–99)
Glucose-Capillary: 74 mg/dL (ref 65–99)

## 2015-12-25 MED ORDER — INSULIN ASPART PROT & ASPART (70-30 MIX) 100 UNIT/ML ~~LOC~~ SUSP
60.0000 [IU] | Freq: Two times a day (BID) | SUBCUTANEOUS | Status: DC
  Administered 2015-12-25 – 2015-12-26 (×2): 60 [IU] via SUBCUTANEOUS
  Filled 2015-12-25 (×2): qty 60

## 2015-12-25 MED ORDER — INSULIN ASPART 100 UNIT/ML ~~LOC~~ SOLN
0.0000 [IU] | Freq: Three times a day (TID) | SUBCUTANEOUS | Status: DC
  Administered 2015-12-25: 5 [IU] via SUBCUTANEOUS
  Administered 2015-12-26: 8 [IU] via SUBCUTANEOUS
  Administered 2015-12-27 (×2): 5 [IU] via SUBCUTANEOUS
  Filled 2015-12-25 (×2): qty 5

## 2015-12-25 MED ORDER — HYDROXYZINE HCL 50 MG PO TABS
50.0000 mg | ORAL_TABLET | Freq: Four times a day (QID) | ORAL | Status: DC | PRN
  Administered 2015-12-25 (×2): 50 mg via ORAL
  Filled 2015-12-25 (×2): qty 1

## 2015-12-25 NOTE — Progress Notes (Signed)
D: Observed pt lying in bed asleep. Pt needed strong encouragement to get out of bed for meds and assessment. Patient alert and oriented x4. Patient denies SI/HI/AVH. Pt affect is anxious and irritable. Pt stated "I'm fine, I don't know why I need to be here." Pt blames doctor at [place] [facility] for his being here. Pt cooperative with Clinical research associate. Pt interacted minimally and forwarded little. Pt c/o of chronic left shoulder pain. A: Offered active listening and support. Provided therapeutic communication. Administered scheduled medications. Administered Ultram prn for pain. R:  Pt isolative to room most of evening. Pt cooperative and compliant with medications. Will continue Q15 min. Checks. Safety maintained.

## 2015-12-25 NOTE — Progress Notes (Signed)
D:  Pt denies SI/HI/AVH. Pain rated 6/10 in left shoulder. Compliant with medications. Pt angry when not allowed to have milk after breakfast time had ended. Pt began speaking loudly in hallway, security stepped in and pt calmed down quickly.  Pt denies anxiety and depression. Complaints of feeling irritable, pt given ordered medication. Pt wanting MD to prescribe ativan or xanax.  MD made aware.  A: Writer offered emotional support. Encouraged participation in groups. Medication administered as ordered. Q 15 min checks continue.  R: Pt attended morning group. Compliant with medications. Respectful with this Clinical research associate (except for milk incident). Later apologized for becoming irritated.

## 2015-12-25 NOTE — Progress Notes (Signed)
[facility] MD Progress Note  12/25/2015 12:46 PM [name]  MRN:  [id number] Subjective:  Patient states that he slept pretty well and is feeling "pretty good" today. He had a headache overnight that was improved with tylenol. He then stated his mood is "irritated" because of an incident with staff surrounding food. He had various complaints about staff members and also mentioned numerous concerns about peers. Patient seems to be hyperverbal at times and hyperfocused on negative content. He denies SI/HI but when asked about AVH he stated " I can't say I hear anything but god and I laugh all the time. I can't hear him but I just know."  Patient also requested "something for agitation." He states that he did not get his insulin last night. Patient hyperfocused on Xanax and mentioned to several staff that he wants Xanax though he denied anxiety.   Patient has Blood glucose in the 500's.   Principal Problem: Undifferentiated schizophrenia ([name]) Diagnosis:   Patient Active Problem List   Diagnosis Date Noted  . Tobacco use disorder [F17.200] 12/24/2015  . DM hyperosmolarity type I, uncontrolled ([name]) [E10.69, E10.65] 12/19/2015  . Type I diabetes mellitus with hyperosmolarity ([name]) [E10.69, E10.65] 12/19/2015  . Left shoulder pain [M25.512] 08/19/2015  . Generalized headache [R51] 07/22/2015  . Bilateral hip bursitis [M70.71, M70.72] 05/26/2015  . Depression [F32.9]   . Generalized anxiety disorder [F41.1] 03/29/2015  . Undifferentiated schizophrenia ([name]) [F20.3]   . Hip pain, bilateral [M25.551, M25.552] 11/18/2014  . Hyponatremia [E87.1] 06/17/2012  . Dehydration [E86.0] 06/17/2012   Total Time spent with patient: 30 minutes  Past Psychiatric History: Per H & P: He was diagnosed with schizoaffective disorder at the age of 68. In the past he's been tried on numerous medications including injectable Risperdal but did not like any of them and feels that Seroquel works best for him. He denies  history of substance use but there is information in the chart he's been drinking and using cocaine at some point. He denies ever attempting suicide.  Past Medical History:  Past Medical History  Diagnosis Date  . Depression   . Schizo affective schizophrenia ([name])   . Polysubstance abuse   . Scoliosis   . Chronic pain   . Noncompliance with medication regimen   . GERD (gastroesophageal reflux disease)   . Arthritis     "both hips" (12/21/2014)  . Asthma     Childhood  . Chicken pox   . Diabetes mellitus     Type 1 diabetes  . DKA (diabetic ketoacidoses) ([name]) 11/18/2014  . Hypertension     Past Surgical History  Procedure Laterality Date  . Hand surgery Left     Left thumb surgery   Family History:  Family History  Problem Relation Age of Onset  . Diabetes Mother    Family Psychiatric  History: Patient states that he doesn't know his family history because he is not in touch with his family.   Social History:  History  Alcohol Use No    Comment: 12/21/2014 "I drink on a rare occasion"     History  Drug Use  . Yes  . Special: Marijuana, Cocaine    Comment: crack cocaine    Social History   Social History  . Marital Status: Legally Separated    Spouse Name: N/A  . Number of Children: 0  . Years of Education: 13   Occupational History  . Disability    Social History Main Topics  . Smoking  status: Current Every Day Smoker -- 1.00 packs/day for 7 years    Types: Cigarettes  . Smokeless tobacco: Never Used  . Alcohol Use: No     Comment: 12/21/2014 "I drink on a rare occasion"  . Drug Use: Yes    Special: Marijuana, Cocaine     Comment: crack cocaine  . Sexual Activity: Yes   Other Topics Concern  . None   Social History Narrative   ** Merged History Encounter **    Fun: Video games      Additional Social History:                         Sleep: Good  Appetite:  Good  Current Medications: Current Facility-Administered Medications   Medication Dose Route Frequency Provider Last Rate Last Dose  . acetaminophen (TYLENOL) tablet 650 mg  650 mg Oral Q6H PRN Shari Prows, MD   650 mg at 12/25/15 0321  . alum & mag hydroxide-simeth (MAALOX/MYLANTA) 200-200-20 MG/5ML suspension 30 mL  30 mL Oral Q4H PRN Jolanta B Pucilowska, MD      . ARIPiprazole (ABILIFY) tablet 10 mg  10 mg Oral Daily Jolanta B Pucilowska, MD   10 mg at 12/25/15 1012  . feeding supplement (GLUCERNA SHAKE) (GLUCERNA SHAKE) liquid 237 mL  237 mL Oral TID Jolanta B Pucilowska, MD   237 mL at 12/25/15 1015  . hydrOXYzine (ATARAX/VISTARIL) tablet 50 mg  50 mg Oral Q6H PRN Eligio Angert L Makaiyah Schweiger, MD   50 mg at 12/25/15 1058  . insulin aspart (novoLOG) injection 0-9 Units  0-9 Units Subcutaneous TID WC Jolanta B Pucilowska, MD   9 Units at 12/25/15 1013  . insulin aspart protamine- aspart (NOVOLOG MIX 70/30) injection 60 Units  60 Units Subcutaneous BID WC Marguarite Arbour, MD      . magnesium hydroxide (MILK OF MAGNESIA) suspension 30 mL  30 mL Oral Daily PRN Jolanta B Pucilowska, MD      . nicotine (NICODERM CQ - dosed in mg/24 hours) patch 21 mg  21 mg Transdermal Q0600 Shari Prows, MD   21 mg at 12/25/15 2248  . QUEtiapine (SEROQUEL) tablet 100 mg  100 mg Oral TID Shari Prows, MD   100 mg at 12/25/15 1012  . traMADol (ULTRAM) tablet 50 mg  50 mg Oral Q6H PRN Jolanta B Pucilowska, MD   50 mg at 12/25/15 1017  . traZODone (DESYREL) tablet 200 mg  200 mg Oral QHS PRN Shari Prows, MD   200 mg at 12/24/15 2150    Lab Results:  Results for orders placed or performed during the hospital encounter of 12/24/15 (from the past 48 hour(s))  Glucose, capillary     Status: None   Collection Time: 12/24/15 12:09 PM  Result Value Ref Range   Glucose-Capillary 93 65 - 99 mg/dL  Glucose, capillary     Status: Abnormal   Collection Time: 12/24/15  4:39 PM  Result Value Ref Range   Glucose-Capillary 285 (H) 65 - 99 mg/dL  Glucose, capillary      Status: Abnormal   Collection Time: 12/24/15  8:58 PM  Result Value Ref Range   Glucose-Capillary 192 (H) 65 - 99 mg/dL   Comment 1 Notify RN   Glucose, capillary     Status: Abnormal   Collection Time: 12/25/15  6:52 AM  Result Value Ref Range   Glucose-Capillary 443 (H) 65 - 99 mg/dL   Comment 1 Notify  RN   Glucose, random     Status: Abnormal   Collection Time: 12/25/15  8:09 AM  Result Value Ref Range   Glucose, Bld 542 (HH) 65 - 99 mg/dL    Comment: CRITICAL RESULT CALLED TO, READ BACK BY AND VERIFIED WITH AMY BOUISVERT 01 07 16 0850 SJL   Glucose, capillary     Status: Abnormal   Collection Time: 12/25/15 11:41 AM  Result Value Ref Range   Glucose-Capillary 517 (H) 65 - 99 mg/dL   Comment 1 Notify RN     Physical Findings: AIMS:  , ,  ,  ,    CIWA:    COWS:     Musculoskeletal: Strength & Muscle Tone: within normal limits Gait & Station: normal Patient leans: N/A  Psychiatric Specialty Exam: Review of Systems  Constitutional: Negative for fever, chills, weight loss, malaise/fatigue and diaphoresis.  Neurological: Positive for headaches. Negative for weakness.  All other systems reviewed and are negative.   Blood pressure 117/78, pulse 107, temperature 98 F (36.7 C), temperature source Oral, resp. rate 18, height 6\' 1"  (1.854 m), weight 65.772 kg (145 lb), SpO2 100 %.Body mass index is 19.13 kg/(m^2).  General Appearance: Disheveled  Eye ::  Fair  Speech:  Pressured  Volume:  Normal  Mood:  Irritable  Affect:  Congruent  Thought Process:  Circumstantial  Orientation:  Full (Time, Place, and Person)  Thought Content:  "God and I laugh all the time"  Suicidal Thoughts:  No  Homicidal Thoughts:  No  Memory:  Negative  Judgement:  Impaired  Insight:  Lacking  Psychomotor Activity:  Normal  Concentration:  Fair  Recall:  Good  Fund of Knowledge:Fair  Language: Good  Akathisia:  Negative  Handed:   AIMS (if indicated):     Assets:  Desire for  Improvement Resilience  ADL's:  Intact  Cognition: WNL  Sleep:  Number of Hours: 7.75   Treatment Plan Summary: Daily contact with patient to assess and evaluate symptoms and progress in treatment, Medication management and Plan consulted Inpatient Hospitalists to evaluate his uncontrolled DM/hyperglycemia despite insulin and sliding scale.    Insulin per Hospitalist recommendation - consult pending Close observation Continue PRNs: Vistaril 50 mg q 6 hours PRN - for agitation/anxiety Nicotine patch for tobacco use Tramadol 50 mg q 6 hours prn Trazodone 200 mg QHS prn  Continue medications as listed below:  Scheduled Meds: . ARIPiprazole  10 mg Oral Daily  . feeding supplement (GLUCERNA SHAKE)  237 mL Oral TID  . insulin aspart  0-15 Units Subcutaneous TID WC  . insulin aspart protamine- aspart  60 Units Subcutaneous BID WC  . nicotine  21 mg Transdermal Q0600  . QUEtiapine  100 mg Oral TID   Continuous Infusions:  PRN Meds:.acetaminophen, alum & mag hydroxide-simeth, hydrOXYzine, magnesium hydroxide, traMADol, traZODone   Jamaia Brum,  Kadejah Sandiford L 12/25/2015, 12:46 PM

## 2015-12-25 NOTE — Consult Note (Signed)
History and Physical    [name] YQM:[id number] DOB: 1986-01-18 DOA: 12/24/2015  Referring physician: Dr. Alvester Morin PCP: No primary care provider on file.  Specialists: none  Chief Complaint: Diabetes, poor control  HPI: [name] is a 30 y.o. male has a past medical history significant for HTN, COPD/asthma, DM admitted to [facility] for schizophrenia. Sugars are poorly controlled on current regimen of 70/30 insulin BID with SSL. He is on a carb modified diet. No N/V/D. No apparent outside source of food. Pt states he did put regular sugar in his coffee this AM, not Splenda.  Review of Systems: The patient denies anorexia, fever, weight loss,, vision loss, decreased hearing, hoarseness, chest pain, syncope, dyspnea on exertion, peripheral edema, balance deficits, hemoptysis, abdominal pain, melena, hematochezia, severe indigestion/heartburn, hematuria, incontinence, genital sores, muscle weakness, suspicious skin lesions, transient blindness, difficulty walking, depression, unusual weight change, abnormal bleeding, enlarged lymph nodes, angioedema, and breast masses.   Past Medical History  Diagnosis Date  . Depression   . Schizo affective schizophrenia ([name])   . Polysubstance abuse   . Scoliosis   . Chronic pain   . Noncompliance with medication regimen   . GERD (gastroesophageal reflux disease)   . Arthritis     "both hips" (12/21/2014)  . Asthma     Childhood  . Chicken pox   . Diabetes mellitus     Type 1 diabetes  . DKA (diabetic ketoacidoses) ([name]) 11/18/2014  . Hypertension    Past Surgical History  Procedure Laterality Date  . Hand surgery Left     Left thumb surgery   Social History:  reports that he has been smoking Cigarettes.  He has a 7 pack-year smoking history. He has never used smokeless tobacco. He reports that he uses illicit drugs (Marijuana and Cocaine). He reports that he does not drink alcohol.  Allergies  Allergen Reactions  . Sulfa Antibiotics  Other (See Comments)    Unknown - Childhood    Family History  Problem Relation Age of Onset  . Diabetes Mother     Prior to Admission medications   Medication Sig Start Date End Date Taking? Authorizing Provider  acetaminophen (TYLENOL) 325 MG tablet Take 2 tablets (650 mg total) by mouth every 6 (six) hours as needed for mild pain, moderate pain, fever or headache (or Fever >/= 101). 12/24/15   Elease Etienne, MD  ARIPiprazole (ABILIFY) 5 MG tablet Take 1 tablet (5 mg total) by mouth at bedtime. 12/24/15   Elease Etienne, MD  insulin aspart (NOVOLOG) 100 UNIT/ML injection Inject 0-9 Units into the skin 3 (three) times daily with meals. CBG < 70: implement hypoglycemia protocol CBG 70 - 120: 0 units CBG 121 - 150: 1 unit CBG 151 - 200: 2 units CBG 201 - 250: 3 units CBG 251 - 300: 5 units CBG 301 - 350: 7 units CBG 351 - 400: 9 units CBG > 400: call MD. 12/24/15   Elease Etienne, MD  insulin NPH-regular Human (NOVOLIN 70/30 RELION) (70-30) 100 UNIT/ML injection Inject 48 Units into the skin 2 (two) times daily with a meal. 12/24/15   Elease Etienne, MD  LORazepam (ATIVAN) 1 MG tablet Take 1 tablet (1 mg total) by mouth every 8 (eight) hours as needed for anxiety (agitation.). 12/24/15   Elease Etienne, MD  nicotine (NICODERM CQ - DOSED IN MG/24 HR) 7 mg/24hr patch Place 1 patch (7 mg total) onto the skin daily. 12/24/15   Theadora Rama  D Hongalgi, MD  traZODone (DESYREL) 100 MG tablet Take 1 tablet (100 mg total) by mouth at bedtime. 12/24/15   Elease Etienne, MD   Physical Exam: Filed Vitals:   12/24/15 1215  BP: 117/78  Pulse: 107  Temp: 98 F (36.7 C)  TempSrc: Oral  Resp: 18  Height: 6\' 1"  (1.854 m)  Weight: 65.772 kg (145 lb)  SpO2: 100%     General:  No apparent distress  Eyes: PERRL, EOMI, no scleral icterus  ENT: moist oropharynx  Neck: supple, no lymphadenopathy  Cardiovascular: rapid rate with regular rhythm without MRG; 2+ peripheral pulses, no JVD, no peripheral  edema  Respiratory: CTA biL, good air movement without wheezing, rhonchi or crackled  Abdomen: soft, non tender to palpation, positive bowel sounds, no guarding, no rebound  Skin: no rashes  Musculoskeletal: normal bulk and tone, no joint swelling  Psychiatric: normal mood and affect  Neurologic: CN 2-12 grossly intact, MS 5/5 in all 4  Labs on Admission:  Basic Metabolic Panel:  Recent Labs Lab 12/19/15 1341 12/19/15 1350 12/20/15 0520 12/23/15 0500 12/24/15 0511 12/25/15 0809  NA 129* 129* 135 138 136  --   K 4.8 4.8 3.8 4.1 4.2  --   CL 90* 90* 101 103 99*  --   CO2 27  --  26 29 30   --   GLUCOSE 894* >700* 403* 204* 294* 542*  BUN 15 16 14  22* 28*  --   CREATININE 1.15 1.00 0.52* 0.48* 0.59*  --   CALCIUM 9.2  --  9.0 8.9 9.2  --    Liver Function Tests: No results for input(s): AST, ALT, ALKPHOS, BILITOT, PROT, ALBUMIN in the last 168 hours. No results for input(s): LIPASE, AMYLASE in the last 168 hours. No results for input(s): AMMONIA in the last 168 hours. CBC:  Recent Labs Lab 12/19/15 1341 12/19/15 1350  WBC 7.3  --   HGB 14.4 16.0  HCT 42.1 47.0  MCV 89.8  --   PLT 218  --    Cardiac Enzymes: No results for input(s): CKTOTAL, CKMB, CKMBINDEX, TROPONINI in the last 168 hours.  BNP (last 3 results) No results for input(s): BNP in the last 8760 hours.  ProBNP (last 3 results) No results for input(s): PROBNP in the last 8760 hours.  CBG:  Recent Labs Lab 12/24/15 1209 12/24/15 1639 12/24/15 2058 12/25/15 0652 12/25/15 1141  GLUCAP 93 285* 192* 443* 517*    Radiological Exams on Admission: No results found.  EKG: Independently reviewed.  Assessment/Plan Principal Problem:   Undifferentiated schizophrenia ([name]) Active Problems:   Type I diabetes mellitus with hyperosmolarity ([name])   Tobacco use disorder   Will increase 70/30 insulin to 60u BID. Change SSI to moderate dosing. Strict adherence to diet. Consult Endocrine on Monday  if not better. Thank you for the consult. Will follow. Call if questions arise.  Diet: carb modified Fluids: none DVT Prophylaxis: per Psych  Code Status: FULL CODE Family Communication: none  Disposition Plan: per Psych  Time spent: 45 min

## 2015-12-25 NOTE — Progress Notes (Signed)
Pain medications given decreased pt's pain.  Vistaril given at 1058 was ineffective. Pt still requesting xanax or ativan. MD aware. No new orders written.

## 2015-12-25 NOTE — Progress Notes (Signed)
Pt denied AVH. Writer witnessed pt in hallway talking, cussing and pointing. There was no one else present in hall.

## 2015-12-25 NOTE — Plan of Care (Signed)
Problem: Alteration in mood Goal: LTG-Patient reports reduction in suicidal thoughts (Patient reports reduction in suicidal thoughts and is able to verbalize a safety plan for whenever patient is feeling suicidal)  Outcome: Progressing Pt denies SI and states "I'm feeling fine."

## 2015-12-25 NOTE — [facility] Group Notes (Signed)
[facility] LCSW Group Therapy  12/25/2015 2:06 PM  Type of Therapy:  Group Therapy  Participation Level:  Minimal  Participation Quality:  Intrusive and Monopolizing  Affect:  Irritable  Cognitive:  Delusional  Insight:  Limited  Engagement in Therapy:  Off Topic  Modes of Intervention:  Discussion, Education, Socialization and Support  Summary of Progress/Problems: Balance in life: Patients will discuss the concept of balance and how it looks and feels to be unbalanced. Pt will identify areas in their life that is unbalanced and ways to become more balanced.  [name] came to group late and began talking immediately. He discussed someone controlling his brain and making him act the way he does. He states he does not have control over his actions because someone else does. He was pressured and monopolizing. CSW attempted to redirect a few times and the patient became increasingly irritable. He was asked to leave group. He left group without contesting.   Zyniah Ferraiolo L Orla Estrin MSW, LCSWA  12/25/2015, 2:06 PM

## 2015-12-25 NOTE — Progress Notes (Signed)
Pt became agitated when he was not allowed to have more for dinner. Pt was yelling, demanding staff call the kitchen and the MD.  Pt continued to yell at curse at [employer]. After a couple minutes pt went to day room.

## 2015-12-26 LAB — GLUCOSE, CAPILLARY
GLUCOSE-CAPILLARY: 103 mg/dL — AB (ref 65–99)
GLUCOSE-CAPILLARY: 120 mg/dL — AB (ref 65–99)
GLUCOSE-CAPILLARY: 261 mg/dL — AB (ref 65–99)
Glucose-Capillary: 35 mg/dL — CL (ref 65–99)
Glucose-Capillary: 201 mg/dL — ABNORMAL HIGH (ref 65–99)

## 2015-12-26 LAB — GLUCOSE, RANDOM: Glucose, Bld: 542 mg/dL — ABNORMAL HIGH (ref 65–99)

## 2015-12-26 MED ORDER — INSULIN ASPART PROT & ASPART (70-30 MIX) 100 UNIT/ML ~~LOC~~ SUSP
25.0000 [IU] | Freq: Two times a day (BID) | SUBCUTANEOUS | Status: DC
  Administered 2015-12-26 – 2015-12-27 (×2): 25 [IU] via SUBCUTANEOUS
  Filled 2015-12-26 (×2): qty 25

## 2015-12-26 MED ORDER — INSULIN ASPART PROT & ASPART (70-30 MIX) 100 UNIT/ML ~~LOC~~ SUSP: 50.0000 [IU] | Freq: Two times a day (BID) | SUBCUTANEOUS | Status: DC

## 2015-12-26 MED ORDER — QUETIAPINE FUMARATE 100 MG PO TABS
150.0000 mg | ORAL_TABLET | Freq: Three times a day (TID) | ORAL | Status: DC
  Administered 2015-12-26 – 2015-12-27 (×3): 150 mg via ORAL
  Filled 2015-12-26 (×3): qty 2

## 2015-12-26 MED ORDER — LORAZEPAM 2 MG/ML IJ SOLN
1.0000 mg | Freq: Two times a day (BID) | INTRAMUSCULAR | Status: DC | PRN
  Administered 2015-12-26 – 2015-12-27 (×4): 1 mg via INTRAMUSCULAR
  Filled 2015-12-26 (×4): qty 1

## 2015-12-26 MED ORDER — LORAZEPAM 1 MG PO TABS: 1.0000 mg | ORAL_TABLET | Freq: Two times a day (BID) | ORAL | Status: DC | PRN

## 2015-12-26 NOTE — Plan of Care (Signed)
Problem: Alteration in mood Goal: LTG-Patient reports reduction in suicidal thoughts (Patient reports reduction in suicidal thoughts and is able to verbalize a safety plan for whenever patient is feeling suicidal)  Outcome: Progressing Patient pleasant and interacting appropriately with staff and peers, expressing his feelings and denying SI/HI. Denying commands

## 2015-12-26 NOTE — Plan of Care (Signed)
Problem: Ineffective individual coping Goal: LTG: Patient will report a decrease in negative feelings Outcome: Progressing Patient reports improvement in mood with decreased negative thinking. Currently pleasant and cooperative, denying thoughts of self harm, denying hallucinations.

## 2015-12-26 NOTE — [facility] Group Notes (Addendum)
[facility] LCSW Group Therapy  12/26/2015 2:35 PM  Type of Therapy:  Group Therapy  Participation Level:  Minimal  Participation Quality:  Inattentive   Affect:  Drowsy  Cognitive:  Disorganized  Insight:  Limited  Engagement in Therapy:  Limited  Modes of Intervention:  Discussion, Education, Socialization and Support  Summary of Progress/Problems: Todays topic: Grudges  Patients will be encouraged to discuss their thoughts, feelings, and behaviors as to why one holds on to grudges and reasons why people have grudges. Patients will process the impact of grudges on their daily lives and identify thoughts and feelings related to holding grudges. Patients will identify feelings and thoughts related to what life would look like without grudges. Pt attended group and stayed the entire time. He sat quietly and listened to other group members share.   [employer] MSW, LCSWA  12/26/2015, 2:35 PM

## 2015-12-26 NOTE — Progress Notes (Signed)
20:00: D: patient seen in the milieu, alert and oriented but restless. Frequently using the phone and reporting that he feels anxious. Denies suicidal homicidal thoughts. Reports that his day was improved but now feeling anxious. Requested medication for anxiety and restlessness. A: Patient received Ativan 1MG  Intramuscular, in Right Deltoid and tolerated well. Emotional support and encouragements offered.  R: Patient remains safe on the unit, no sign of distress.

## 2015-12-26 NOTE — Progress Notes (Signed)
Pt blood sugar was 56 at 1945. Pt given juice and food. Blood sugar rechecked 15 min later and was 74.

## 2015-12-26 NOTE — Progress Notes (Signed)
[facility] MD Progress Note  12/26/2015 10:14 AM [name]  MRN:  [id number] Subjective:    Feels a little bit agitated today. He mentioned "swat teams" and needing a bomb to blow out the window. Lots of inappropriate laughter. He then began talking about a lamp and wondering if there was hidden alcohol in it and if he could drink it. While talking for a long time he suddenlt stopped and said "I forgot." Again talking about "overpowering the constant power that I must defend myself and constantly overcome." laughing inappropriately. He feels the Abilify is doing nothing. "I don't won't back down for anything." Expressed various complaints and seems to be describing anxiety but "can't really describe it. That's the problem with words, they put you in a box then control you."   He says that he got an issue yesterday because "I don't get enough food." Again with complaints with staff.   Wants Adderall because he takes it for "physical depression".  Requesting Ativan to calm down as he continues to feel agitated and feels that vistaril did not help him.   Disorganized.  Paranoid.   Patient has Blood glucose in the 100's today after changes from internal medicine.   Per nursing notes: D: Observed pt pacing in hallway. Patient alert and oriented x4. Patient denies SI/HI/AVH, but pt is talking to self with clear signs of responding to internal stimuli. Pt affect is very labile, angry, verbally aggressive, demanding, and arrogrant. Pt wrote down a breakfast, lunch and dinner request, and when writer informed pt we would do our best within our limits to accomodate, Pt went from calm and cooperative to loud, angry, and verbally aggressive. Pt started pacing halls yelling and slamming doors, "I better get my food.Marland KitchenMarland KitchenYou're going to have to put hands on me if I don't get my food!" Pt started arguing with pt in room 306 challenging her to come out of her room and fight him. After reassurance, pt became much more  calm and cooperative. Pt content is circumstantial, bizarre and disorganized at times. Pt c/o of pain, insomnia, and anxiety. Pt was also suggesting that diabetes does not exist. A: Offered active listening and support. Provided therapeutic communication. Administered scheduled medications. Administered Ultram prn for pain, trazodone for sleep, and vistaril for anxiety. Reassured pt that we would do what we could to accommodate his meal preference. Attempted to discuss aggressive behavior with pt. R: Pt not agreeable to any discussion on behavior. Pt compliant with medications, but suspicious. Pt isolated to room after med administration. Will continue Q15 min. Checks. Safety maintained.  Principal Problem: Undifferentiated schizophrenia ([name]) Diagnosis:   Patient Active Problem List   Diagnosis Date Noted  . Tobacco use disorder [F17.200] 12/24/2015  . DM hyperosmolarity type I, uncontrolled ([name]) [E10.69, E10.65] 12/19/2015  . Type I diabetes mellitus with hyperosmolarity ([name]) [E10.69, E10.65] 12/19/2015  . Left shoulder pain [M25.512] 08/19/2015  . Generalized headache [R51] 07/22/2015  . Bilateral hip bursitis [M70.71, M70.72] 05/26/2015  . Depression [F32.9]   . Generalized anxiety disorder [F41.1] 03/29/2015  . Undifferentiated schizophrenia ([name]) [F20.3]   . Hip pain, bilateral [M25.551, M25.552] 11/18/2014  . Hyponatremia [E87.1] 06/17/2012  . Dehydration [E86.0] 06/17/2012   Total Time spent with patient: 30 minutes  Past Psychiatric History: Per H & P: He was diagnosed with schizoaffective disorder at the age of 49. In the past he's been tried on numerous medications including injectable Risperdal but did not like any of them and feels that Seroquel  works best for him. He denies history of substance use but there is information in the chart he's been drinking and using cocaine at some point. He denies ever attempting suicide.  Past Medical History:  Past Medical History   Diagnosis Date  . Depression   . Schizo affective schizophrenia ([name])   . Polysubstance abuse   . Scoliosis   . Chronic pain   . Noncompliance with medication regimen   . GERD (gastroesophageal reflux disease)   . Arthritis     "both hips" (12/21/2014)  . Asthma     Childhood  . Chicken pox   . Diabetes mellitus     Type 1 diabetes  . DKA (diabetic ketoacidoses) ([name]) 11/18/2014  . Hypertension     Past Surgical History  Procedure Laterality Date  . Hand surgery Left     Left thumb surgery   Family History:  Family History  Problem Relation Age of Onset  . Diabetes Mother    Family Psychiatric  History: Patient states that he doesn't know his family history because he is not in touch with his family.   Social History:  History  Alcohol Use No    Comment: 12/21/2014 "I drink on a rare occasion"     History  Drug Use  . Yes  . Special: Marijuana, Cocaine    Comment: crack cocaine    Social History   Social History  . Marital Status: Legally Separated    Spouse Name: N/A  . Number of Children: 0  . Years of Education: 13   Occupational History  . Disability    Social History Main Topics  . Smoking status: Current Every Day Smoker -- 1.00 packs/day for 7 years    Types: Cigarettes  . Smokeless tobacco: Never Used  . Alcohol Use: No     Comment: 12/21/2014 "I drink on a rare occasion"  . Drug Use: Yes    Special: Marijuana, Cocaine     Comment: crack cocaine  . Sexual Activity: Yes   Other Topics Concern  . None   Social History Narrative   ** Merged History Encounter **    Fun: Video games      Additional Social History:                         Sleep: Good  Appetite:  Good  Current Medications: Current Facility-Administered Medications  Medication Dose Route Frequency Provider Last Rate Last Dose  . acetaminophen (TYLENOL) tablet 650 mg  650 mg Oral Q6H PRN Shari Prows, MD   650 mg at 12/26/15 0644  . alum & mag  hydroxide-simeth (MAALOX/MYLANTA) 200-200-20 MG/5ML suspension 30 mL  30 mL Oral Q4H PRN Jolanta B Pucilowska, MD      . ARIPiprazole (ABILIFY) tablet 10 mg  10 mg Oral Daily Jolanta B Pucilowska, MD   10 mg at 12/26/15 0846  . feeding supplement (GLUCERNA SHAKE) (GLUCERNA SHAKE) liquid 237 mL  237 mL Oral TID Shari Prows, MD   237 mL at 12/26/15 0851  . hydrOXYzine (ATARAX/VISTARIL) tablet 50 mg  50 mg Oral Q6H PRN Lockie Pares, MD   50 mg at 12/25/15 2224  . insulin aspart (novoLOG) injection 0-15 Units  0-15 Units Subcutaneous TID WC Marguarite Arbour, MD   5 Units at 12/25/15 1653  . insulin aspart protamine- aspart (NOVOLOG MIX 70/30) injection 60 Units  60 Units Subcutaneous BID WC Marguarite Arbour, MD  60 Units at 12/26/15 0845  . magnesium hydroxide (MILK OF MAGNESIA) suspension 30 mL  30 mL Oral Daily PRN Jolanta B Pucilowska, MD      . nicotine (NICODERM CQ - dosed in mg/24 hours) patch 21 mg  21 mg Transdermal Q0600 Shari Prows, MD   21 mg at 12/26/15 0646  . QUEtiapine (SEROQUEL) tablet 100 mg  100 mg Oral TID Shari Prows, MD   100 mg at 12/26/15 0846  . traMADol (ULTRAM) tablet 50 mg  50 mg Oral Q6H PRN Shari Prows, MD   50 mg at 12/26/15 0850  . traZODone (DESYREL) tablet 200 mg  200 mg Oral QHS PRN Shari Prows, MD   200 mg at 12/25/15 2225    Lab Results:  Results for orders placed or performed during the hospital encounter of 12/24/15 (from the past 48 hour(s))  Glucose, capillary     Status: None   Collection Time: 12/24/15 12:09 PM  Result Value Ref Range   Glucose-Capillary 93 65 - 99 mg/dL  Glucose, capillary     Status: Abnormal   Collection Time: 12/24/15  4:39 PM  Result Value Ref Range   Glucose-Capillary 285 (H) 65 - 99 mg/dL  Glucose, capillary     Status: Abnormal   Collection Time: 12/24/15  8:58 PM  Result Value Ref Range   Glucose-Capillary 192 (H) 65 - 99 mg/dL   Comment 1 Notify RN   Glucose, capillary      Status: Abnormal   Collection Time: 12/25/15  6:52 AM  Result Value Ref Range   Glucose-Capillary 443 (H) 65 - 99 mg/dL   Comment 1 Notify RN   Glucose, random     Status: Abnormal   Collection Time: 12/25/15  8:09 AM  Result Value Ref Range   Glucose, Bld 542 (HH) 65 - 99 mg/dL    Comment: CRITICAL RESULT CALLED TO, READ BACK BY AND VERIFIED WITH AMY BOUISVERT 01 07 16 0850 SJL   Glucose, capillary     Status: Abnormal   Collection Time: 12/25/15 11:41 AM  Result Value Ref Range   Glucose-Capillary 517 (H) 65 - 99 mg/dL   Comment 1 Notify RN   Glucose, capillary     Status: Abnormal   Collection Time: 12/25/15  4:48 PM  Result Value Ref Range   Glucose-Capillary 229 (H) 65 - 99 mg/dL  Glucose, capillary     Status: Abnormal   Collection Time: 12/25/15  7:54 PM  Result Value Ref Range   Glucose-Capillary 56 (L) 65 - 99 mg/dL  Glucose, capillary     Status: None   Collection Time: 12/25/15  8:28 PM  Result Value Ref Range   Glucose-Capillary 74 65 - 99 mg/dL  Glucose, capillary     Status: Abnormal   Collection Time: 12/26/15  6:44 AM  Result Value Ref Range   Glucose-Capillary 103 (H) 65 - 99 mg/dL    Physical Findings: AIMS:  , ,  ,  ,    CIWA:    COWS:     Musculoskeletal: Strength & Muscle Tone: within normal limits Gait & Station: normal Patient leans: N/A  Psychiatric Specialty Exam: Review of Systems  Constitutional: Negative for fever, chills, weight loss, malaise/fatigue and diaphoresis.  Neurological: Positive for headaches. Negative for weakness.  All other systems reviewed and are negative.   Blood pressure 109/71, pulse 118, temperature 98 F (36.7 C), temperature source Oral, resp. rate 18, height 6\' 1"  (1.854 m),  weight 65.772 kg (145 lb), SpO2 100 %.Body mass index is 19.13 kg/(m^2).  General Appearance: Disheveled  Eye Solicitor::  Fair  Speech:  Pressured  Volume:  Increased  Mood:  Anxious and Irritable  Affect:  Congruent and Inappropriate   Thought Process:  Circumstantial, Disorganized and Tangential  Orientation:  Full (Time, Place, and Person)  Thought Content:  Paranoid Ideation and "God and I laugh all the time"  Suicidal Thoughts:  No  Homicidal Thoughts:  No  Memory:  Negative  Judgement:  Impaired  Insight:  Lacking  Psychomotor Activity:  Normal  Concentration:  Fair  Recall:  Good  Fund of Knowledge:Fair  Language: Good  Akathisia:  Negative  Handed:   AIMS (if indicated):     Assets:  Desire for Improvement Resilience  ADL's:  Intact  Cognition: WNL  Sleep:  Number of Hours: 6.75   Treatment Plan Summary: Daily contact with patient to assess and evaluate symptoms and progress in treatment, Medication management and Plan consulted Inpatient Hospitalists to evaluate his uncontrolled DM/hyperglycemia despite insulin and sliding scale.     Mr. Hensch is a 30 year old male with a history of schizoaffective disorder and type 1 diabetes transferred from medical floor of [facility] where he was hospitalized for diabetes ketoacidosis in the context of insulin noncompliance. The patient was also found psychotic and required further stabilization.  Insulin per Hospitalist recommendation - blood sugar better controlled today.   Close observation - q15 min checks  Continue PRNs: Vistaril 50 mg q 6 hours PRN - for agitation/anxiety Ativan 1 mg BID PRN IM or PO. Patient prefers IM.  Nicotine patch for tobacco use Tramadol 50 mg q 6 hours prn Trazodone 200 mg QHS prn  Continue medications as listed below:   Psychosis:  Plan to increase Seroquel to 150 mg tid 1/8 Plan to increase Abilify to 15 mg tomorrow to address psychosis and paranoia.      Sukaina Toothaker,  Claire Bridge L 12/26/2015, 10:14 AM
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
## 2015-12-26 NOTE — Progress Notes (Signed)
D: Observed pt pacing in hallway. Patient alert and oriented x4. Patient denies SI/HI/AVH, but pt is talking to self with clear signs of responding to internal stimuli. Pt affect is very labile, angry, verbally aggressive, demanding, and arrogrant. Pt wrote down a breakfast, lunch and dinner request, and when writer informed pt we would do our best within our limits to accomodate, Pt went from calm and cooperative to loud, angry, and verbally aggressive. Pt started pacing halls yelling and slamming doors, "I better get my food.Marland KitchenMarland KitchenYou're going to have to put hands on me if I don't get my food!" Pt started arguing with pt in room 306 challenging her to come out of her room and fight him. After reassurance, pt became much more calm and cooperative. Pt content is circumstantial, bizarre and disorganized at times. Pt c/o of pain, insomnia, and anxiety. Pt was also suggesting that diabetes does not exist. A: Offered active listening and support. Provided therapeutic communication. Administered scheduled medications. Administered Ultram prn for pain, trazodone for sleep, and vistaril for anxiety. Reassured pt that we would do what we could to accommodate his meal preference. Attempted to discuss aggressive behavior with pt. R: Pt not agreeable to any discussion on behavior. Pt compliant with medications, but suspicious. Pt isolated to room after med administration. Will continue Q15 min. Checks. Safety maintained.

## 2015-12-26 NOTE — Progress Notes (Signed)
D: Pt denies SI/HI and AVH. Pain 6/10 in shoulder. Pt compliant with medications. Pt continually voices complaints meals. No yelling outbursts. Spent time in day room interacting.  A: Writer offered emotional support. Encouraged continued participation in groups. Reminded pt that policies concerning meals/snacks are in place and cannot be altered. Q 15 min checks continue.  R: Pt attended groups. Pt seem in dayroom interacting with other patients. Compliant with medications.

## 2015-12-26 NOTE — Plan of Care (Signed)
Problem: Aggression Towards others,Towards Self, and or Destruction Goal: LTG - No aggression,physical/verbal/destruction prior to D/C (Patient will have no episodes of physical or verbal aggression or property destruction towards self or others for _____ day (s) prior to discharge.) Outcome: Not Progressing Pt remains verbally aggressive when he doesn't get what he wants.  Problem: Alteration in thought process Goal: LTG-Patient behavior demonstrates decreased signs psychosis (Patient behavior demonstrates decreased signs of psychosis to the point the patient is safe to return home and continue treatment in an outpatient setting.) Outcome: Not Progressing Pt denies AVH, but is talking to self and appears to be responding to internal stimuli.

## 2015-12-27 LAB — GLUCOSE, CAPILLARY
GLUCOSE-CAPILLARY: 152 mg/dL — AB (ref 65–99)
GLUCOSE-CAPILLARY: 86 mg/dL (ref 65–99)
GLUCOSE-CAPILLARY: 233 mg/dL — AB (ref 65–99)
Glucose-Capillary: 214 mg/dL — ABNORMAL HIGH (ref 65–99)

## 2015-12-27 MED ORDER — ARIPIPRAZOLE ER 400 MG IM SUSR
400.0000 mg | INTRAMUSCULAR | Status: DC
  Administered 2015-12-27: 400 mg via INTRAMUSCULAR
  Filled 2015-12-27: qty 400

## 2015-12-27 MED ORDER — ARIPIPRAZOLE 10 MG PO TABS: 20.0000 mg | ORAL_TABLET | Freq: Every day | ORAL | Status: DC

## 2015-12-27 MED ORDER — QUETIAPINE FUMARATE 200 MG PO TABS: 200.0000 mg | ORAL_TABLET | Freq: Three times a day (TID) | ORAL | Status: DC

## 2015-12-27 MED ORDER — INSULIN ASPART 100 UNIT/ML ~~LOC~~ SOLN
5.0000 [IU] | Freq: Once | SUBCUTANEOUS | Status: AC
  Administered 2015-12-27: 5 [IU] via SUBCUTANEOUS

## 2015-12-27 MED ORDER — TRAZODONE HCL 100 MG PO TABS: 100.0000 mg | ORAL_TABLET | Freq: Every day | ORAL | Status: DC

## 2015-12-27 MED ORDER — INSULIN NPH ISOPHANE & REGULAR (70-30) 100 UNIT/ML ~~LOC~~ SUSP: 48.0000 [IU] | Freq: Two times a day (BID) | SUBCUTANEOUS | Status: DC

## 2015-12-27 MED ORDER — ARIPIPRAZOLE ER 400 MG IM SUSR
400.0000 mg | INTRAMUSCULAR | Status: DC
Start: 2015-12-27 — End: 2016-02-26

## 2015-12-27 NOTE — [facility] Group Notes (Signed)
[facility] Group Notes:  (Nursing/MHT/Case Management/Adjunct)  Date:  12/27/2015  Time:  3:30 AM  Type of Therapy:  Group Therapy  Participation Level:  Active  Participation Quality:  Appropriate  Affect:  Flat  Cognitive:  Appropriate  Insight:  Lacking  Engagement in Group:  Lacking  Modes of Intervention:  n/a  Summary of Progress/Problems:  [name] 12/27/2015, 3:30 AM

## 2015-12-27 NOTE — Progress Notes (Signed)
[facility] - [place] at [facility]   PATIENT NAME: [name]    MR#:  [id number]  DATE OF BIRTH:  1986-05-29  SUBJECTIVE: I feel better now after having bowel movement CHIEF COMPLAINT:  No chief complaint on file.   Review of Systems  Constitutional: Negative for fever, chills and weight loss.  HENT: Negative for congestion.   Eyes: Negative for blurred vision and double vision.  Respiratory: Negative for cough, sputum production, shortness of breath and wheezing.   Cardiovascular: Negative for chest pain, palpitations, orthopnea, leg swelling and PND.  Gastrointestinal: Positive for abdominal pain and constipation. Negative for nausea, vomiting, diarrhea and blood in stool.  Genitourinary: Negative for dysuria, urgency, frequency and hematuria.  Musculoskeletal: Negative for falls.  Neurological: Negative for dizziness, tremors, focal weakness and headaches.  Endo/Heme/Allergies: Does not bruise/bleed easily.  Psychiatric/Behavioral: Negative for depression. The patient does not have insomnia.     VITAL SIGNS: Blood pressure 110/66, pulse 102, temperature 97.6 F (36.4 C), temperature source Oral, resp. rate 18, height 6\' 1"  (1.854 m), weight 65.772 kg (145 lb), SpO2 100 %.  PHYSICAL EXAMINATION:   GENERAL:  30 y.o.-year-old patient lying in the bed with no acute distress.  EYES: Pupils equal, round, reactive to light and accommodation. No scleral icterus. Extraocular muscles intact.  HEENT: Head atraumatic, normocephalic. Oropharynx and nasopharynx clear.  NECK:  Supple, no jugular venous distention. No thyroid enlargement, no tenderness.  LUNGS: Normal breath sounds bilaterally, no wheezing, rales,rhonchi or crepitation. No use of accessory muscles of respiration.  CARDIOVASCULAR: S1, S2 normal. No murmurs, rubs, or gallops.  ABDOMEN: Soft, diffusely uncomfortable to touch but no significant tenderness, some distention . Bowel sounds present.  No organomegaly or mass.  EXTREMITIES: No pedal edema, cyanosis, or clubbing.  NEUROLOGIC: Cranial nerves II through XII are intact. Muscle strength 5/5 in all extremities. Sensation intact. Gait not checked.  PSYCHIATRIC: The patient is alert and oriented x 3.  SKIN: No obvious rash, lesion, or ulcer.   ORDERS/RESULTS REVIEWED:   CBC No results for input(s): WBC, HGB, HCT, PLT, MCV, MCH, MCHC, RDW, LYMPHSABS, MONOABS, EOSABS, BASOSABS, BANDABS in the last 168 hours.  Invalid input(s): NEUTRABS, BANDSABD ------------------------------------------------------------------------------------------------------------------  Chemistries   Recent Labs Lab 12/23/15 0500 12/24/15 0511 12/25/15 0809  NA 138 136  --   K 4.1 4.2  --   CL 103 99*  --   CO2 29 30  --   GLUCOSE 204* 294* 542*  BUN 22* 28*  --   CREATININE 0.48* 0.59*  --   CALCIUM 8.9 9.2  --    ------------------------------------------------------------------------------------------------------------------ estimated creatinine clearance is 124.5 mL/min (by C-G formula based on Cr of 0.59). ------------------------------------------------------------------------------------------------------------------ No results for input(s): TSH, T4TOTAL, T3FREE, THYROIDAB in the last 72 hours.  Invalid input(s): FREET3  Cardiac Enzymes No results for input(s): CKMB, TROPONINI, MYOGLOBIN in the last 168 hours.  Invalid input(s): CK ------------------------------------------------------------------------------------------------------------------ Invalid input(s): POCBNP ---------------------------------------------------------------------------------------------------------------  RADIOLOGY: No results found.  EKG:  Orders placed or performed during the hospital encounter of 12/19/15  . EKG 12-Lead  . EKG 12-Lead  . EKG 12-Lead  . EKG 12-Lead  . EKG    ASSESSMENT AND PLAN:  Principal Problem:   Undifferentiated  schizophrenia ([name]) Active Problems:   Type I diabetes mellitus with hyperosmolarity ([name])   Tobacco use disorder 1. Diabetes mellitus type 1, patient is to continue his usual doses of medications, blood glucose levels are ranging from 80s to 400s, hemoglobin A1c was not  checked 2. Diffuse abdominal pain, likely constipation related, seems to be resolving with defecation 3. Schizophrenia, therapy per psychiatrist, likely discharge home today per patient 4. Tobacco abuse. Counseling, discussed this patient for approximately 3-4 minutes. Nicotine replacement therapy is initiated  Management plans discussed with the patient, family and they are in agreement.   DRUG ALLERGIES:  Allergies  Allergen Reactions  . Sulfa Antibiotics Other (See Comments)    Unknown - Childhood    CODE STATUS:     Code Status Orders        Start     Ordered   12/24/15 1223  Full code   Continuous     12/24/15 1222      TOTAL TIME TAKING CARE OF THIS PATIENT: 30 minutes.    Katharina Caper M.D on 12/27/2015 at 2:11 PM  Between 7am to 6pm - Pager - [phone]  After 6pm go to [email or web address] - password [email or web address] [facility]  [facility]  Office  [phone]  CC: Primary care physician; No primary care provider on file.

## 2015-12-27 NOTE — Progress Notes (Signed)
0430 D: Patient woke up and presented to the nurses station, stating that he wet his bed. He also complained of Feeling "very hungry". Asked for a snack. CBG 86. A:  Patient had a snack and returned to bed.  R. Currently in room, calm. safety maintained

## 2015-12-27 NOTE — [facility] Suicide Risk Assessment (Signed)
[facility] Discharge Suicide Risk Assessment   Demographic Factors:  Male and Living alone  Total Time spent with patient: 30 minutes  Musculoskeletal: Strength & Muscle Tone: within normal limits Gait & Station: normal Patient leans: N/A  Psychiatric Specialty Exam: Physical Exam  Nursing note and vitals reviewed.   Review of Systems  All other systems reviewed and are negative.   Blood pressure 110/66, pulse 102, temperature 97.6 F (36.4 C), temperature source Oral, resp. rate 18, height 6\' 1"  (1.854 m), weight 65.772 kg (145 lb), SpO2 100 %.Body mass index is 19.13 kg/(m^2).  General Appearance: Casual  Eye Contact::  Good  Speech:  Clear and Coherent409  Volume:  Normal  Mood:  Euthymic  Affect:  Labile  Thought Process:  Goal Directed  Orientation:  Full (Time, Place, and Person)  Thought Content:  WDL, Delusions, Hallucinations: Auditory and Paranoid Ideation  Suicidal Thoughts:  No  Homicidal Thoughts:  No  Memory:  Immediate;   Fair Recent;   Fair Remote;   Fair  Judgement:  Impaired  Insight:  Shallow  Psychomotor Activity:  Normal  Concentration:  Fair  Recall:  [id number] of Knowledge:Fair  Language: Fair  Akathisia:  No  Handed:  Right  AIMS (if indicated):     Assets:  Communication Skills Desire for Improvement Financial Resources/Insurance Housing Physical Health Resilience Social Support  Sleep:  Number of Hours: 5.45  Cognition: WNL  ADL's:  Intact   Have you used any form of tobacco in the last 30 days? (Cigarettes, Smokeless Tobacco, Cigars, and/or Pipes): Yes  Has this patient used any form of tobacco in the last 30 days? (Cigarettes, Smokeless Tobacco, Cigars, and/or Pipes) Yes, A prescription for an FDA-approved tobacco cessation medication was offered at discharge and the patient refused  Mental Status Per Nursing Assessment::   On Admission:  NA  Current Mental Status by Physician: NA  Loss Factors: Decline in physical  health  Historical Factors: Impulsivity  Risk Reduction Factors:   Sense of responsibility to family and Positive social support  Continued Clinical Symptoms:  Schizophrenia:   Less than 25 years old Paranoid or undifferentiated type  Cognitive Features That Contribute To Risk:  None    Suicide Risk:  Minimal: No identifiable suicidal ideation.  Patients presenting with no risk factors but with morbid ruminations; may be classified as minimal risk based on the severity of the depressive symptoms  Principal Problem: Undifferentiated schizophrenia [facility]) Discharge Diagnoses:  Patient Active Problem List   Diagnosis Date Noted  . Tobacco use disorder [F17.200] 12/24/2015  . DM hyperosmolarity type I, uncontrolled ([name]) [E10.69, E10.65] 12/19/2015  . Type I diabetes mellitus with hyperosmolarity ([name]) [E10.69, E10.65] 12/19/2015  . Left shoulder pain [M25.512] 08/19/2015  . Generalized headache [R51] 07/22/2015  . Bilateral hip bursitis [M70.71, M70.72] 05/26/2015  . Depression [F32.9]   . Generalized anxiety disorder [F41.1] 03/29/2015  . Undifferentiated schizophrenia ([name]) [F20.3]   . Hip pain, bilateral [M25.551, M25.552] 11/18/2014  . Hyponatremia [E87.1] 06/17/2012  . Dehydration [E86.0] 06/17/2012      Plan Of Care/Follow-up recommendations:  Activity:  As tolerated. Diet:  Low sodium heart healthy ADA diet. Other:  Keep follow-up appointments.  Is patient on multiple antipsychotic therapies at discharge:  Yes,   Do you recommend tapering to monotherapy for antipsychotics?  Yes   Has Patient had three or more failed trials of antipsychotic monotherapy by history:  Yes,   Antipsychotic medications that previously failed include:   1.  Risperdal., 2.  seroquel. and 3.  zyprexa.  Recommended Plan for Multiple Antipsychotic Therapies: Patient's medications are in the process of a cross-taper;  medications include:  Switched from oral Seroquel to injectable Abilify  maintenance.    Keili Hasten 12/27/2015, 12:16 PM

## 2015-12-27 NOTE — Progress Notes (Signed)
Pt denies SI/HI/AVH. Pt given discharge instructions including prescriptions and f/u appointments. Pt states understanding. Pt states receipt of all belongings. Patient transportation was provided by [employer].

## 2015-12-27 NOTE — Progress Notes (Signed)
  [facility] Adult Case Management Discharge Plan :  Will you be returning to the same living situation after discharge:  Yes,  back to his [place] apartment At discharge, do you have transportation home?: Yes,  patient has will call [employer] car and has money to pay for it Do you have the ability to pay for your medications: Yes,  patient has Medicaid  Release of information consent forms completed and in the chart;  Patient's signature needed at discharge.  Patient to Follow up at: Follow-up Information    Schedule an appointment as soon as possible for a visit with [facility].   Why:  Patient refused follow up care. For follow-up care patient can walk in to [facility] M-F 8-4 for assessment   Contact information:   [address] [place], [place] Ph [phone] Fax [phone]      Next level of care provider has access to [facility] Link:no  Safety Planning and Suicide Prevention discussed: Yes,  SPE provided to patient but patient refused family contact  Have you used any form of tobacco in the last 30 days? (Cigarettes, Smokeless Tobacco, Cigars, and/or Pipes): Yes  Has patient been referred to the Quitline?: Patient refused referral  Patient has been referred for addiction treatment: N/A  Lulu Riding, MSW, LCSWA  [phone]  12/27/2015, 1:47 PM

## 2015-12-27 NOTE — [facility] Suicide Risk Assessment (Signed)
[facility] INPATIENT:  Family/Significant Other Suicide Prevention Education  Suicide Prevention Education:  Patient Refusal for Family/Significant Other Suicide Prevention Education: The patient [name] has refused to provide written consent for family/significant other to be provided Family/Significant Other Suicide Prevention Education during admission and/or prior to discharge.  Physician notified.  Lulu Riding, MSW, LCSWA 12/27/2015, 1:41 PM

## 2015-12-27 NOTE — Tx Team (Signed)
Interdisciplinary Treatment Plan Update (Adult)  Date:  12/27/2015 Time Reviewed:  1:34 PM  Progress in Treatment: Attending groups: No. Participating in groups:  No. Taking medication as prescribed:  Yes. Tolerating medication:  Yes. Family/Significant othe contact made:  No, will contact:  if patient provides consent. Patient has refused so far.  Patient understands diagnosis:  Yes. Discussing patient identified problems/goals with staff:  Yes. Medical problems stabilized or resolved:  Yes. Denies suicidal/homicidal ideation: Yes. Issues/concerns per patient self-inventory:  No. Other:  New problem(s) identified: No, Describe:  none reported  Discharge Plan or Barriers:Patient will discharge back to his apartment and refuses follow up care but is giving [facility] walk in hours and contact info. Patient may need a cab voucher at discharge back to [place]. PART RTE 4 is not running due to weather today.   Reason for Continuation of Hospitalization: Anxiety Medication stabilization  Comments:  Estimated length of stay:0 days discharging today Monday 12/27/15  New goal(s):  Review of initial/current patient goals per problem list:   1.  Goal(s):participate in care planning  Met:  Yes  Target date:by discharge  2.  Goal (s):anger becomes manageable  Met:  Yes  Target date:by discharge  Attendees: Physician:  Orson Slick, MD 1/9/20171:34 PM  Nursing:   Amie Portland, RN 1/9/20171:34 PM  Other:  Carmell Austria, [place] 1/9/20171:34 PM  Other:   1/9/20171:34 PM  Other:   1/9/20171:34 PM  Other:  1/9/20171:34 PM  Other:  1/9/20171:34 PM  Other:  1/9/20171:34 PM  Other:  1/9/20171:34 PM  Other:  1/9/20171:34 PM  Other:  1/9/20171:34 PM  Other:   1/9/20171:34 PM   Scribe for Treatment Team:   Keene Breath, MSW, LCSWA [phone] 12/27/2015, 1:34 PM

## 2015-12-27 NOTE — Discharge Summary (Signed)
Physician Discharge Summary Note  Patient:  [name] is an 30 y.o., male MRN:  [id number] DOB:  [birth year] Patient phone:  [phone] (home)  Patient address:   [address] [place] [address],  Total Time spent with patient: 30 minutes  Date of Admission:  12/24/2015 Date of Discharge: 12/27/2015  Reason for Admission:  Psychotic break.  Identifying data. Mr. Venuto is a 30 year old male with a history of schizoaffective disorder and poorly controlled type 1 diabetes.  Chief complaint. "I am not a danger to myself or anybody else."  History of present illness. Information was obtained from the patient and the chart. [name] has a long history of depression, anxiety, psychosis, and mood instability with several psychiatric hospitalizations. He was admitted to [facility] in April 2016 for a psychotic break but did not follow-up with mental health professionals. He claims that his trazodone and Seroquel as well as other have been prescribed by his primary care provider. He has no intention to follow-up with a psychiatrist. He was admitted to [facility] at the beginning of January with severe hyperglycemia. The patient explains that he always runs out of money on the last week of the month. He has no money to buy food or insulin. He tells me that his insurance does not pay for enough insulin. He has multiple admissions for ketoacidosis. Prior to admission the patient reports that he was getting in trouble at the soup kitchen for agitated behavior. During recent hospitalization he was found psychotic and was transferred to [facility] for further stabilization.The patient adamantly denies any symptoms of depression or psychosis. He denies symptoms suggestive of bipolar mania. He denies feeling suicidal or homicidal. He reports severe anxiety and is asking for Xanax. He also reports diagnosis of ADHD for which she enjoys taking Adderall. He denies alcohol or  illicit substance use.  Past psychiatric history. He was diagnosed with schizoaffective disorder at the age of 36. In the past he's been tried on numerous medications including injectable Risperdal but did not like any of them and feels that Seroquel works best for him. He denies history of substance use but there is information in the chart he's been drinking and using cocaine at some point. He denies ever attempting suicide.  Family psychiatric history. The patient doesn't stay in touch with his family except for his mother and has no information.  Social history. He is originally from [place] where his mother still resides. He moved to [place] because it is cheaper here. She graduated from high school and went to college some. He is now disabled from mental illness. He has health insurance. He lives independently in an apartment in [place]. He has hard time paying his rent, electricity, phone and Internet bill and frequently runs out of money.   Principal Problem: Undifferentiated schizophrenia [facility]) Discharge Diagnoses: Patient Active Problem List   Diagnosis Date Noted  . Tobacco use disorder [F17.200] 12/24/2015  . DM hyperosmolarity type I, uncontrolled ([place]) [E10.69, E10.65] 12/19/2015  . Type I diabetes mellitus with hyperosmolarity ([name]) [E10.69, E10.65] 12/19/2015  . Left shoulder pain [M25.512] 08/19/2015  . Generalized headache [R51] 07/22/2015  . Bilateral hip bursitis [M70.71, M70.72] 05/26/2015  . Depression [F32.9]   . Generalized anxiety disorder [F41.1] 03/29/2015  . Undifferentiated schizophrenia ([place]) [F20.3]   . Hip pain, bilateral [M25.551, M25.552] 11/18/2014  . Hyponatremia [E87.1] 06/17/2012  . Dehydration [E86.0] 06/17/2012    Past Psychiatric History:  schizophrenia.  Past  Medical History:  Past Medical History  Diagnosis Date  . Depression   . Schizo affective schizophrenia ([place])   . Polysubstance abuse   . Scoliosis   . Chronic pain   .  Noncompliance with medication regimen   . GERD (gastroesophageal reflux disease)   . Arthritis     "both hips" (12/21/2014)  . Asthma     Childhood  . Chicken pox   . Diabetes mellitus     Type 1 diabetes  . DKA (diabetic ketoacidoses) ([place]) 11/18/2014  . Hypertension     Past Surgical History  Procedure Laterality Date  . Hand surgery Left     Left thumb surgery   Family History:  Family History  Problem Relation Age of Onset  . Diabetes Mother    Family Psychiatric  History:  none reported. Social History:  History  Alcohol Use No    Comment: 12/21/2014 "I drink on a rare occasion"     History  Drug Use  . Yes  . Special: Marijuana, Cocaine    Comment: crack cocaine    Social History   Social History  . Marital Status: Legally Separated    Spouse Name: N/A  . Number of Children: 0  . Years of Education: 13   Occupational History  . Disability    Social History Main Topics  . Smoking status: Current Every Day Smoker -- 1.00 packs/day for 7 years    Types: Cigarettes  . Smokeless tobacco: Never Used  . Alcohol Use: No     Comment: 12/21/2014 "I drink on a rare occasion"  . Drug Use: Yes    Special: Marijuana, Cocaine     Comment: crack cocaine  . Sexual Activity: Yes   Other Topics Concern  . None   Social History Narrative   ** Merged History Encounter **    Fun: Video games       Hospital Course:    [name] is a 30 year old male with a history of schizoaffective disorder and type 1 diabetes transferred from medical floor of [facility] where he was hospitalized for diabetes ketoacidosis in the context of insulin noncompliance. The patient was also found psychotic and required further stabilization.  1. Psychosis. The patient was restarted on Abilify at [facility]. He agreed to take Abilify maintena and was given 400 mg on 12/27/2015. He feels that Seroquel has been working best for him and was started on Seroquel for psychosis. At the time of  discharge the patient is still mildly paranoid and delusional. According to the staff he attends to internal stimuli. He is irritable. We met with the patient and the Education officer, museum. He was calm and collected in spite of mild psychotic symptoms, agreed to take injectable long-lasting antipsychotic, and to continue Seroquel. He however he refuses any follow-up with the psychiatrist.  2. ADHD. We doubted the patient has ADHD diagnosis. He had been prescribed Adderall by his primary care provider. Adderall is no longer available.   3. Insomnia. He responds well to trazodone.  4. Diabetes. The patient is on 70/30 NovoLog twice daily. He has ADD diet and sliding scale insulin while in the hospital. The patient has been taking 40-48 units twice a day at home. At the time of transfer from [facility] it was recommended that he continues on 48 units twice daily. We were informed that SSI is NOT to be continued at home. He has been on a much stricter diet here and his nonverbal dose will  be lowered to 25 units twice a day. The patient continuously complained of feeling hungry and he informs me that he consumes much younger meals at home and that he does not follow ADD diet. He was discharged to home on a higher dose of 70/30 Novolog with no sliding scale. He will follow up with his primary doctor.  5. Smoking. Nicotine patch Was available.  6. Disposition. He was discharged to home. He refuses to follow up with a psychiatrist.   Physical Findings: AIMS:  , ,  ,  ,    CIWA:    COWS:     Musculoskeletal: Strength & Muscle Tone: within normal limits Gait & Station: normal Patient leans: N/A  Psychiatric Specialty Exam: Review of Systems  Respiratory: Positive for stridor.   All other systems reviewed and are negative.   Blood pressure 110/66, pulse 102, temperature 97.6 F (36.4 C), temperature source Oral, resp. rate 18, height '6\' 1"'$  (1.854 m), weight 65.772 kg (145 lb), SpO2 100 %.Body mass index is  19.13 kg/(m^2).  See SRA.                                                  Sleep:  Number of Hours: 5.45   Have you used any form of tobacco in the last 30 days? (Cigarettes, Smokeless Tobacco, Cigars, and/or Pipes): Yes  Has this patient used any form of tobacco in the last 30 days? (Cigarettes, Smokeless Tobacco, Cigars, and/or Pipes) Yes, Yes, A prescription for an FDA-approved tobacco cessation medication was offered at discharge and the patient refused  Metabolic Disorder Labs:  Lab Results  Component Value Date   HGBA1C 14.6* 11/05/2015   MPG 372 11/05/2015   MPG 295 06/01/2015   No results found for: PROLACTIN Lab Results  Component Value Date   CHOL 143 08/20/2011   TRIG 110 08/20/2011   HDL 42 08/20/2011   CHOLHDL 3.4 08/20/2011   VLDL 22 08/20/2011   LDLCALC 79 08/20/2011    See Psychiatric Specialty Exam and Suicide Risk Assessment completed by Attending Physician prior to discharge.  Discharge destination:  Home  Is patient on multiple antipsychotic therapies at discharge:  Yes,   Do you recommend tapering to monotherapy for antipsychotics?  Yes   Has Patient had three or more failed trials of antipsychotic monotherapy by history:  Yes,   Antipsychotic medications that previously failed include:   1.  Risperdal., 2.  Seroquel. and 3.  Zyprexa.  Recommended Plan for Multiple Antipsychotic Therapies: Patient's medications are in the process of a cross-taper;  medications include:  From oral Seroquel to injectable Abilify maintena.  Discharge Instructions    Diet - low sodium heart healthy    Complete by:  As directed      Increase activity slowly    Complete by:  As directed             Medication List    STOP taking these medications        ARIPiprazole 5 MG tablet  Commonly known as:  ABILIFY  Replaced by:  ARIPiprazole 400 MG Susr     insulin aspart 100 UNIT/ML injection  Commonly known as:  novoLOG     LORazepam 1 MG tablet   Commonly known as:  ATIVAN     nicotine 7 mg/24hr patch  Commonly known as:  NICODERM CQ - dosed in mg/24 hr      TAKE these medications      Indication   acetaminophen 325 MG tablet  Commonly known as:  TYLENOL  Take 2 tablets (650 mg total) by mouth every 6 (six) hours as needed for mild pain, moderate pain, fever or headache (or Fever >/= 101).      ARIPiprazole 400 MG Susr  Inject 400 mg into the muscle every 28 (twenty-eight) days.   Indication:  Schizophrenia     insulin NPH-regular Human (70-30) 100 UNIT/ML injection  Commonly known as:  NOVOLIN 70/30 RELION  Inject 48 Units into the skin 2 (two) times daily with a meal.   Indication:  Insulin-Dependent Diabetes     QUEtiapine 200 MG tablet  Commonly known as:  SEROQUEL  Take 1 tablet (200 mg total) by mouth 3 (three) times daily.   Indication:  Schizophrenia     traZODone 100 MG tablet  Commonly known as:  DESYREL  Take 1 tablet (100 mg total) by mouth at bedtime.   Indication:  Trouble Sleeping         Follow-up recommendations:  Activity:  As tolerated. Diet:  Low sodium heart healthy ADA diet Other:  Keep follow-up appointments.  Comments:    Signed: Jolanta Pucilowska 12/27/2015, 12:23 PM

## 2015-12-27 NOTE — Progress Notes (Signed)
9201: D: Patient currently sleeping soundly. No sign of distress noted A: Staff continue to monitor for safety and other needs R: Patient's safety maintained on the unit

## 2015-12-27 NOTE — BHH Group Notes (Signed)
[facility] LCSW Group Therapy  12/27/2015 11:06 AM  Type of Therapy:  Group Therapy  Participation Level:  Did Not Attend  Glennon Mac, MSW, LCSW 12/27/2015, 11:06 AM

## 2015-12-27 NOTE — BHH Group Notes (Signed)
[facility] Group Notes:  (Nursing/MHT/Case Management/Adjunct)  Date:  12/27/2015  Time:  12:32 PM  Type of Therapy:  Psychoeducational Skills  Participation Level:  Did Not Attend  [name] 12/27/2015, 12:32 PM

## 2015-12-31 ENCOUNTER — Encounter (HOSPITAL_COMMUNITY): Payer: Self-pay

## 2015-12-31 ENCOUNTER — Emergency Department (HOSPITAL_COMMUNITY)
Admission: EM | Admit: 2015-12-31 | Discharge: 2016-01-01 | Disposition: A | Discharge status: Home / Self Care | Payer: PPO | Attending: Emergency Medicine | Admitting: Emergency Medicine

## 2015-12-31 DIAGNOSIS — J45909 Unspecified asthma, uncomplicated: Secondary | ICD-10-CM | POA: Insufficient documentation

## 2015-12-31 DIAGNOSIS — F250 Schizoaffective disorder, bipolar type: Secondary | ICD-10-CM | POA: Diagnosis not present

## 2015-12-31 DIAGNOSIS — I10 Essential (primary) hypertension: Secondary | ICD-10-CM | POA: Insufficient documentation

## 2015-12-31 DIAGNOSIS — M419 Scoliosis, unspecified: Secondary | ICD-10-CM | POA: Diagnosis not present

## 2015-12-31 DIAGNOSIS — R000 Tachycardia, unspecified: Secondary | ICD-10-CM | POA: Insufficient documentation

## 2015-12-31 DIAGNOSIS — Z794 Long term (current) use of insulin: Secondary | ICD-10-CM | POA: Insufficient documentation

## 2015-12-31 DIAGNOSIS — E1065 Type 1 diabetes mellitus with hyperglycemia: Secondary | ICD-10-CM

## 2015-12-31 DIAGNOSIS — Z9114 Patient's other noncompliance with medication regimen: Secondary | ICD-10-CM | POA: Diagnosis not present

## 2015-12-31 DIAGNOSIS — G8929 Other chronic pain: Secondary | ICD-10-CM | POA: Insufficient documentation

## 2015-12-31 DIAGNOSIS — E1069 Type 1 diabetes mellitus with other specified complication: Secondary | ICD-10-CM | POA: Diagnosis not present

## 2015-12-31 DIAGNOSIS — E860 Dehydration: Secondary | ICD-10-CM | POA: Insufficient documentation

## 2015-12-31 DIAGNOSIS — E1010 Type 1 diabetes mellitus with ketoacidosis without coma: Secondary | ICD-10-CM | POA: Insufficient documentation

## 2015-12-31 DIAGNOSIS — Z8619 Personal history of other infectious and parasitic diseases: Secondary | ICD-10-CM | POA: Insufficient documentation

## 2015-12-31 DIAGNOSIS — Z79899 Other long term (current) drug therapy: Secondary | ICD-10-CM | POA: Insufficient documentation

## 2015-12-31 DIAGNOSIS — Z8719 Personal history of other diseases of the digestive system: Secondary | ICD-10-CM | POA: Diagnosis not present

## 2015-12-31 DIAGNOSIS — M1990 Unspecified osteoarthritis, unspecified site: Secondary | ICD-10-CM | POA: Insufficient documentation

## 2015-12-31 DIAGNOSIS — F329 Major depressive disorder, single episode, unspecified: Secondary | ICD-10-CM | POA: Insufficient documentation

## 2015-12-31 DIAGNOSIS — F17210 Nicotine dependence, cigarettes, uncomplicated: Secondary | ICD-10-CM | POA: Insufficient documentation

## 2015-12-31 LAB — URINALYSIS, ROUTINE W REFLEX MICROSCOPIC
Bilirubin Urine: NEGATIVE
Glucose, UA: >1000 mg/dL — AB
Hgb urine dipstick: NEGATIVE
KETONES UR: 40 mg/dL — AB
LEUKOCYTES UA: NEGATIVE
NITRITE: NEGATIVE
PH: 6 (ref 5.0–8.0)
Protein, ur: NEGATIVE mg/dL
SPECIFIC GRAVITY, URINE: 1.045 — AB (ref 1.005–1.030)

## 2015-12-31 LAB — CBC
HEMATOCRIT: 39.7 % (ref 39.0–52.0)
HEMOGLOBIN: 13.5 g/dL (ref 13.0–17.0)
MCH: 31 pg (ref 26.0–34.0)
MCHC: 34 g/dL (ref 30.0–36.0)
MCV: 91.3 fL (ref 78.0–100.0)
Platelets: 328 10*3/uL (ref 150–400)
RBC: 4.35 MIL/uL (ref 4.22–5.81)
RDW: 12 % (ref 11.5–15.5)
WBC: 9.8 10*3/uL (ref 4.0–10.5)

## 2015-12-31 LAB — BASIC METABOLIC PANEL
ANION GAP: 14 (ref 5–15)
BUN: 20 mg/dL (ref 6–20)
CO2: 27 mmol/L (ref 22–32)
Calcium: 9.3 mg/dL (ref 8.9–10.3)
Chloride: 94 mmol/L — ABNORMAL LOW (ref 101–111)
Creatinine, Ser: 0.78 mg/dL (ref 0.61–1.24)
Glucose, Bld: 563 mg/dL (ref 65–99)
POTASSIUM: 4.9 mmol/L (ref 3.5–5.1)
SODIUM: 135 mmol/L (ref 135–145)

## 2015-12-31 LAB — URINE MICROSCOPIC-ADD ON
BACTERIA UA: NONE SEEN
WBC UA: NONE SEEN WBC/hpf (ref 0–5)

## 2015-12-31 LAB — CBG MONITORING, ED
GLUCOSE-CAPILLARY: 433 mg/dL — AB (ref 65–99)
Glucose-Capillary: 581 mg/dL (ref 65–99)
Glucose-Capillary: 392 mg/dL — ABNORMAL HIGH (ref 65–99)

## 2015-12-31 MED ORDER — SODIUM CHLORIDE 0.9 % IV BOLUS (SEPSIS)
1000.0000 mL | Freq: Once | INTRAVENOUS | Status: AC
  Administered 2015-12-31: 1000 mL via INTRAVENOUS

## 2015-12-31 MED ORDER — METOCLOPRAMIDE HCL 5 MG/ML IJ SOLN
10.0000 mg | Freq: Once | INTRAMUSCULAR | Status: AC
  Administered 2015-12-31: 10 mg via INTRAVENOUS
  Filled 2015-12-31: qty 2

## 2015-12-31 MED ORDER — ONDANSETRON HCL 4 MG/2ML IJ SOLN
4.0000 mg | Freq: Once | INTRAMUSCULAR | Status: AC
  Administered 2015-12-31: 4 mg via INTRAVENOUS
  Filled 2015-12-31: qty 2

## 2015-12-31 MED ORDER — INSULIN ASPART 100 UNIT/ML ~~LOC~~ SOLN
5.0000 [IU] | Freq: Once | SUBCUTANEOUS | Status: AC
  Administered 2015-12-31: 5 [IU] via INTRAVENOUS
  Filled 2015-12-31: qty 1

## 2015-12-31 MED ORDER — INSULIN ASPART PROT & ASPART (70-30 MIX) 100 UNIT/ML ~~LOC~~ SUSP
40.0000 [IU] | Freq: Once | SUBCUTANEOUS | Status: AC
  Administered 2015-12-31: 40 [IU] via SUBCUTANEOUS
  Filled 2015-12-31: qty 10

## 2015-12-31 MED ORDER — GI COCKTAIL ~~LOC~~
10.0000 mL | Freq: Once | ORAL | Status: AC
  Administered 2015-12-31: 10 mL via ORAL
  Filled 2015-12-31: qty 30

## 2015-12-31 MED ORDER — SODIUM CHLORIDE 0.9 % IV BOLUS (SEPSIS): 1000.0000 mL | Freq: Once | INTRAVENOUS | Status: AC

## 2015-12-31 MED ORDER — SODIUM CHLORIDE 0.9 % IV SOLN
1000.0000 mL | Freq: Once | INTRAVENOUS | Status: AC
  Administered 2015-12-31: 1000 mL via INTRAVENOUS

## 2015-12-31 NOTE — Progress Notes (Addendum)
CSW received call from EDP stating patient was unable to pay for insulin and to see if social work could assist. CSW spoke with Asst. Social Work Interior and spatial designer, who informed CSW to Firefighter CM.  CSW spoke with patient at bedside and he reports he is unable to pay for his insulin. Patient reports his insurance in [facility] [employer].   CSW called and spoke with Addison Naegeli, [facility] Nurse Case Manager. She stated she would be able to go to [facility] to receive assistance with payment for insulin. She stated to inform patient of this information and to call her back if needed.   CSW spoke with patient to inform him that he can go to [facility] to receive assistance with payment for his insulin. CSW asked patient if he knew where this clinic is and he stated "near [facility]". CSW informed him that is it near [facility]. CSW asked patient if he wanted a contact number and patient replied, "I don't have a phone". Patient had no further questions for CSW at this time.   CSW informed EDP of the updated information.  Elenore Paddy [phone] ED CSW 12/31/2015 12:00 PM

## 2015-12-31 NOTE — ED Notes (Signed)
Patient aware we need urine. Urinal at bedside 

## 2015-12-31 NOTE — ED Provider Notes (Signed)
CSN: [id number]     Arrival date & time 12/31/15  1003 History   First MD Initiated Contact with Patient 12/31/15 1029     Chief Complaint  Patient presents with  . Hyperglycemia  . Tachycardia     (Consider location/radiation/quality/duration/timing/severity/associated sxs/prior Treatment) HPI..... Patient is a known type I diabetic who has not been taking his insulin lately. He has known mental health problems including skis so affective schizophrenia and depression. Also history of polysubstance abuse. He has not taken his medication for several days. He feels that his glucose is elevated. He has been drinking fluids.  No fever, sweats, chills, dysuria, dyspnea, chest pain.  Severity of symptoms is moderate.  Past Medical History  Diagnosis Date  . Depression   . Schizo affective schizophrenia ([name])   . Polysubstance abuse   . Scoliosis   . Chronic pain   . Noncompliance with medication regimen   . GERD (gastroesophageal reflux disease)   . Arthritis     "both hips" (12/21/2014)  . Asthma     Childhood  . Chicken pox   . Diabetes mellitus     Type 1 diabetes  . DKA (diabetic ketoacidoses) ([name]) 11/18/2014  . Hypertension    Past Surgical History  Procedure Laterality Date  . Hand surgery Left     Left thumb surgery   Family History  Problem Relation Age of Onset  . Diabetes Mother    Social History  Substance Use Topics  . Smoking status: Current Every Day Smoker -- 1.00 packs/day for 7 years    Types: Cigarettes  . Smokeless tobacco: Never Used  . Alcohol Use: No     Comment: 12/21/2014 "I drink on a rare occasion"    Review of Systems  All other systems reviewed and are negative.     Allergies  Sulfa antibiotics  Home Medications   Prior to Admission medications   Medication Sig Start Date End Date Taking? Authorizing Provider  ARIPiprazole 400 MG SUSR Inject 400 mg into the muscle every 28 (twenty-eight) days. 12/27/15  Yes Jolanta B Pucilowska, MD   insulin NPH-regular Human (NOVOLIN 70/30 RELION) (70-30) 100 UNIT/ML injection Inject 48 Units into the skin 2 (two) times daily with a meal. 12/27/15  Yes Jolanta B Pucilowska, MD  QUEtiapine (SEROQUEL) 200 MG tablet Take 1 tablet (200 mg total) by mouth 3 (three) times daily. 12/27/15  Yes Jolanta B Pucilowska, MD  traZODone (DESYREL) 100 MG tablet Take 1 tablet (100 mg total) by mouth at bedtime. 12/27/15  Yes Jolanta B Pucilowska, MD  acetaminophen (TYLENOL) 325 MG tablet Take 2 tablets (650 mg total) by mouth every 6 (six) hours as needed for mild pain, moderate pain, fever or headache (or Fever >/= 101). Patient not taking: Reported on 12/31/2015 12/24/15   Elease Etienne, MD   BP 163/86 mmHg  Pulse 103  Temp(Src) 98 F (36.7 C) (Oral)  Resp 16  SpO2 100% Physical Exam  Constitutional: He is oriented to person, place, and time.  Alert, dehydrated  HENT:  Head: Normocephalic and atraumatic.  Eyes: Conjunctivae and EOM are normal. Pupils are equal, round, and reactive to light.  Neck: Normal range of motion. Neck supple.  Cardiovascular: Normal rate and regular rhythm.   Pulmonary/Chest: Effort normal and breath sounds normal.  Abdominal: Soft. Bowel sounds are normal.  Musculoskeletal: Normal range of motion.  Neurological: He is alert and oriented to person, place, and time.  Skin: Skin is warm and dry.  Psychiatric:  He has a normal mood and affect. His behavior is normal.  Nursing note and vitals reviewed.   ED Course  Procedures (including critical care time) Labs Review Labs Reviewed  BASIC METABOLIC PANEL - Abnormal; Notable for the following:    Chloride 94 (*)    Glucose, Bld 563 (*)    All other components within normal limits  URINALYSIS, ROUTINE W REFLEX MICROSCOPIC (NOT AT [facility]) - Abnormal; Notable for the following:    Specific Gravity, Urine 1.045 (*)    Glucose, UA >1000 (*)    Ketones, ur 40 (*)    All other components within normal limits  URINE  MICROSCOPIC-ADD ON - Abnormal; Notable for the following:    Squamous Epithelial / LPF 0-5 (*)    All other components within normal limits  CBG MONITORING, ED - Abnormal; Notable for the following:    Glucose-Capillary 581 (*)    All other components within normal limits  CBG MONITORING, ED - Abnormal; Notable for the following:    Glucose-Capillary 433 (*)    All other components within normal limits  CBG MONITORING, ED - Abnormal; Notable for the following:    Glucose-Capillary 392 (*)    All other components within normal limits  CBC    Imaging Review No results found. I have personally reviewed and evaluated these images and lab results as part of my medical decision-making.   EKG Interpretation   Date/Time:  Friday December 31 2015 10:41:04 EST Ventricular Rate:  103 PR Interval:  145 QRS Duration: 84 QT Interval:  357 QTC Calculation: 467 R Axis:   29 Text Interpretation:  Sinus tachycardia ST elev, probable normal early  repol pattern Confirmed by Xochil Shanker  MD, Maleny Candy ([id number]) on 12/31/2015 11:27:08  AM      MDM   Final diagnoses:  Uncontrolled type 1 diabetes mellitus with hyperosmolarity without coma ([name])    Patient is hyperglycemic, but does not appear to be in DKA. Feels much better after IV fluids and IV insulin. I consulted social work to help him with his medications and physician follow-up. We gave him a vial of Humulin 70/30 and he will follow-up with [facility]    Donnetta Hutching, MD 12/31/15 1510

## 2015-12-31 NOTE — ED Notes (Addendum)
Per EMS, [facility], c/o hyperglycemia, tachycardia, and "acid reflux" x 3 days.  Denies pain.  Pt reports he cannot afford insulin.  Last dose x 3 days ago while at [facility].  Hx of DM and schizoaffective.  CBG 595 en route.

## 2015-12-31 NOTE — Discharge Instructions (Signed)
Registered nurse will give you quantity of insulin. You must go to the [facility] on Monday for follow-up. Phone number given.  Return here if worse

## 2015-12-31 NOTE — ED Notes (Signed)
Attempted to D/C chart at this time but says "Infusion currently running." With no infusion running. Patient has left department at this time.

## 2015-12-31 NOTE — Discharge Planning (Signed)
[facility] consulted by [name]) regarding medication assistance.  EDCM reviewed chart to find that pt has insurance (will not qualify for [facility] program) and has been seen at [facility], [place].  Advised [name] to remind pt that he may visit [facility] pharmacy to purchase Rx at discount price (according to sliding scale).

## 2016-01-22 ENCOUNTER — Encounter (HOSPITAL_COMMUNITY): Payer: Self-pay

## 2016-01-22 ENCOUNTER — Emergency Department (HOSPITAL_COMMUNITY)
Admission: EM | Admit: 2016-01-22 | Discharge: 2016-01-24 | Disposition: A | Discharge status: Other Acute Inpatient Hospital | Payer: PPO | Attending: Emergency Medicine | Admitting: Emergency Medicine

## 2016-01-22 DIAGNOSIS — R45851 Suicidal ideations: Secondary | ICD-10-CM | POA: Diagnosis not present

## 2016-01-22 DIAGNOSIS — R440 Auditory hallucinations: Secondary | ICD-10-CM

## 2016-01-22 DIAGNOSIS — E1065 Type 1 diabetes mellitus with hyperglycemia: Secondary | ICD-10-CM | POA: Insufficient documentation

## 2016-01-22 DIAGNOSIS — F329 Major depressive disorder, single episode, unspecified: Secondary | ICD-10-CM | POA: Insufficient documentation

## 2016-01-22 DIAGNOSIS — Z8719 Personal history of other diseases of the digestive system: Secondary | ICD-10-CM | POA: Insufficient documentation

## 2016-01-22 DIAGNOSIS — R630 Anorexia: Secondary | ICD-10-CM | POA: Insufficient documentation

## 2016-01-22 DIAGNOSIS — R000 Tachycardia, unspecified: Secondary | ICD-10-CM | POA: Diagnosis not present

## 2016-01-22 DIAGNOSIS — G8929 Other chronic pain: Secondary | ICD-10-CM | POA: Diagnosis not present

## 2016-01-22 DIAGNOSIS — Z8739 Personal history of other diseases of the musculoskeletal system and connective tissue: Secondary | ICD-10-CM | POA: Diagnosis not present

## 2016-01-22 DIAGNOSIS — Z79899 Other long term (current) drug therapy: Secondary | ICD-10-CM | POA: Insufficient documentation

## 2016-01-22 DIAGNOSIS — G479 Sleep disorder, unspecified: Secondary | ICD-10-CM | POA: Insufficient documentation

## 2016-01-22 DIAGNOSIS — F17210 Nicotine dependence, cigarettes, uncomplicated: Secondary | ICD-10-CM | POA: Insufficient documentation

## 2016-01-22 DIAGNOSIS — F200 Paranoid schizophrenia: Secondary | ICD-10-CM | POA: Diagnosis not present

## 2016-01-22 DIAGNOSIS — R739 Hyperglycemia, unspecified: Secondary | ICD-10-CM

## 2016-01-22 DIAGNOSIS — F419 Anxiety disorder, unspecified: Secondary | ICD-10-CM | POA: Insufficient documentation

## 2016-01-22 DIAGNOSIS — M160 Bilateral primary osteoarthritis of hip: Secondary | ICD-10-CM | POA: Diagnosis not present

## 2016-01-22 DIAGNOSIS — Z8619 Personal history of other infectious and parasitic diseases: Secondary | ICD-10-CM | POA: Insufficient documentation

## 2016-01-22 DIAGNOSIS — Z794 Long term (current) use of insulin: Secondary | ICD-10-CM | POA: Insufficient documentation

## 2016-01-22 DIAGNOSIS — I10 Essential (primary) hypertension: Secondary | ICD-10-CM | POA: Diagnosis not present

## 2016-01-22 DIAGNOSIS — F22 Delusional disorders: Secondary | ICD-10-CM

## 2016-01-22 DIAGNOSIS — J45909 Unspecified asthma, uncomplicated: Secondary | ICD-10-CM | POA: Insufficient documentation

## 2016-01-22 LAB — CBG MONITORING, ED
Glucose-Capillary: 577 mg/dL (ref 65–99)
Glucose-Capillary: 384 mg/dL — ABNORMAL HIGH (ref 65–99)
Glucose-Capillary: 335 mg/dL — ABNORMAL HIGH (ref 65–99)

## 2016-01-22 LAB — COMPREHENSIVE METABOLIC PANEL
ALK PHOS: 69 U/L (ref 38–126)
ALT: 26 U/L (ref 17–63)
ANION GAP: 13 (ref 5–15)
AST: 21 U/L (ref 15–41)
Albumin: 4.3 g/dL (ref 3.5–5.0)
BILIRUBIN TOTAL: 0.6 mg/dL (ref 0.3–1.2)
BUN: 12 mg/dL (ref 6–20)
CALCIUM: 9.1 mg/dL (ref 8.9–10.3)
CO2: 27 mmol/L (ref 22–32)
Chloride: 88 mmol/L — ABNORMAL LOW (ref 101–111)
Creatinine, Ser: 0.93 mg/dL (ref 0.61–1.24)
GFR calc non Af Amer: >60 mL/min (ref >60)
Glucose, Bld: 853 mg/dL (ref 65–99)
Potassium: 4.6 mmol/L (ref 3.5–5.1)
Sodium: 128 mmol/L — ABNORMAL LOW (ref 135–145)
TOTAL PROTEIN: 6.5 g/dL (ref 6.5–8.1)

## 2016-01-22 LAB — ETHANOL: Alcohol, Ethyl (B): <5 mg/dL (ref <5)

## 2016-01-22 LAB — CBC
HCT: 40.7 % (ref 39.0–52.0)
Hemoglobin: 14.7 g/dL (ref 13.0–17.0)
MCH: 31.9 pg (ref 26.0–34.0)
MCHC: 36.1 g/dL — AB (ref 30.0–36.0)
MCV: 88.3 fL (ref 78.0–100.0)
Platelets: 233 10*3/uL (ref 150–400)
RBC: 4.61 MIL/uL (ref 4.22–5.81)
RDW: 11.8 % (ref 11.5–15.5)
WBC: 7.3 10*3/uL (ref 4.0–10.5)

## 2016-01-22 LAB — SALICYLATE LEVEL

## 2016-01-22 LAB — RAPID URINE DRUG SCREEN, HOSP PERFORMED
Amphetamines: NOT DETECTED
BARBITURATES: NOT DETECTED
Benzodiazepines: NOT DETECTED
COCAINE: NOT DETECTED
OPIATES: NOT DETECTED
Tetrahydrocannabinol: NOT DETECTED

## 2016-01-22 LAB — ACETAMINOPHEN LEVEL

## 2016-01-22 MED ORDER — INSULIN ASPART 100 UNIT/ML ~~LOC~~ SOLN
0.0000 [IU] | Freq: Every day | SUBCUTANEOUS | Status: DC
  Administered 2016-01-22: 4 [IU] via SUBCUTANEOUS
  Filled 2016-01-22: qty 1

## 2016-01-22 MED ORDER — SODIUM CHLORIDE 0.9 % IV SOLN
INTRAVENOUS | Status: DC
  Administered 2016-01-22: 5.2 [IU]/h via INTRAVENOUS
  Filled 2016-01-22: qty 2.5

## 2016-01-22 MED ORDER — LORAZEPAM 2 MG/ML IJ SOLN
1.0000 mg | Freq: Once | INTRAMUSCULAR | Status: AC
  Administered 2016-01-22: 1 mg via INTRAVENOUS
  Filled 2016-01-22: qty 1

## 2016-01-22 MED ORDER — INSULIN ASPART 100 UNIT/ML ~~LOC~~ SOLN
4.0000 [IU] | Freq: Three times a day (TID) | SUBCUTANEOUS | Status: DC
  Administered 2016-01-23 – 2016-01-24 (×5): 4 [IU] via SUBCUTANEOUS
  Filled 2016-01-22 (×5): qty 1

## 2016-01-22 MED ORDER — LORAZEPAM 1 MG PO TABS
2.0000 mg | ORAL_TABLET | Freq: Once | ORAL | Status: AC
  Administered 2016-01-22: 2 mg via ORAL
  Filled 2016-01-22: qty 2

## 2016-01-22 MED ORDER — DEXTROSE-NACL 5-0.45 % IV SOLN
INTRAVENOUS | Status: DC
  Administered 2016-01-22: 20:00:00 via INTRAVENOUS

## 2016-01-22 MED ORDER — INSULIN ASPART 100 UNIT/ML ~~LOC~~ SOLN
0.0000 [IU] | Freq: Three times a day (TID) | SUBCUTANEOUS | Status: DC
  Administered 2016-01-23: 8 [IU] via SUBCUTANEOUS
  Administered 2016-01-23: 15 [IU] via SUBCUTANEOUS
  Administered 2016-01-23: 11 [IU] via SUBCUTANEOUS
  Administered 2016-01-24: 8 [IU] via SUBCUTANEOUS
  Administered 2016-01-24: 15 [IU] via SUBCUTANEOUS
  Filled 2016-01-22 (×5): qty 1

## 2016-01-22 MED ORDER — SODIUM CHLORIDE 0.9 % IV SOLN
INTRAVENOUS | Status: DC
  Administered 2016-01-22: 22:00:00 via INTRAVENOUS

## 2016-01-22 MED ORDER — SODIUM CHLORIDE 0.9 % IV BOLUS (SEPSIS)
1000.0000 mL | Freq: Once | INTRAVENOUS | Status: AC
  Administered 2016-01-22: 1000 mL via INTRAVENOUS

## 2016-01-22 MED ORDER — ONDANSETRON HCL 4 MG PO TABS: 4.0000 mg | ORAL_TABLET | Freq: Three times a day (TID) | ORAL | Status: DC | PRN

## 2016-01-22 MED ORDER — QUETIAPINE FUMARATE 200 MG PO TABS
200.0000 mg | ORAL_TABLET | Freq: Three times a day (TID) | ORAL | Status: DC
  Administered 2016-01-22 – 2016-01-24 (×4): 200 mg via ORAL
  Filled 2016-01-22 (×4): qty 1

## 2016-01-22 MED ORDER — ACETAMINOPHEN 325 MG PO TABS: 650.0000 mg | ORAL_TABLET | ORAL | Status: DC | PRN

## 2016-01-22 MED ORDER — TRAZODONE HCL 100 MG PO TABS
100.0000 mg | ORAL_TABLET | Freq: Every day | ORAL | Status: DC
  Administered 2016-01-22: 100 mg via ORAL
  Filled 2016-01-22: qty 1

## 2016-01-22 NOTE — ED Notes (Signed)
Pt changed into purple scrubs and has been wanded by security. Staffing is being called for a sitter.

## 2016-01-22 NOTE — ED Notes (Signed)
Staffing called for sitter. Will send sitter As soon as possible.

## 2016-01-22 NOTE — ED Notes (Signed)
Environmental Svc Tech just woke pt when she emptied trash.  Pt immediately asked for sandwich again.  Repeated that the RN had to handle his request.

## 2016-01-22 NOTE — ED Notes (Addendum)
Pt woke from a deep sleep and immediately insisted that he be provided coffee.  This EMT, serving as temp sitter, explained he could not have anything until approved by his RN and MD, and that EMT did not have authority.  Pt quickly became confrontational and demanded for MD to come to room stat!  His RN explained that pt's MD was busy, and he could not to have food until CBG was lower.  Pt then mentioned MD again in a threatening manner, insisting he was promised coffee and must get it now. His RN reminded that he could not have sugar, p[t agreed, then left to prepare cup with 2 crm & 2 Splenda.  Pt then demanded his Diet Coke that was canned, so this EMT poured it into a cup with a straw due to SI.  Finally, pt escalated by very strongly ordering this EMT to get the charge RN to provide him dinner tray right now! The pt's RN had just reminded pt that he had already eaten and must wait until his CBG was closer to normal, then left to care for other pts. This greatly angered pt who then insisted, with a raised voice, that charge come get food now.  This EMT explained he needed to share concerns with his RN first.  Pt then escalated further by using the call signal to speak to the NT/NS about food and charge, who happened to be opposite his doorway.  The NS told pt face to face exactly what he had heard twice before, that he must have a lower CBG before eating again, and that charge would state the same thing. Suddenly the pt dropped it, and immediately fell asleep.

## 2016-01-22 NOTE — ED Notes (Signed)
Pt denies suicidal plan, just reports suicidal ideation.

## 2016-01-22 NOTE — ED Notes (Signed)
Pt reports he has been feeling very restless and suicidal for the past 2 days. He denies suicide attempt since then. He had Schizoaffective disorder but has been off his meds for about 3 months. Denies drug/alcohol abuse. [facility] same.

## 2016-01-22 NOTE — BH Assessment (Signed)
Faxed clinical information to the following facilities for placement:  [facility] [address], [facility], [facility], [facility] Triage Specialist [phone]

## 2016-01-22 NOTE — ED Provider Notes (Addendum)
CSN: [id number]     Arrival date & time 01/22/16  1606 History   First MD Initiated Contact with Patient 01/22/16 1643     Chief Complaint  Patient presents with  . Suicidal     (Consider location/radiation/quality/duration/timing/severity/associated sxs/prior Treatment) HPI Comments: Patient is a 30 year old male with a history of schizoaffective disorder who has been off medications for the last 3 months and states within the last few weeks the police rated his apartment and took all of his electronic devices and now he is unable to sleep, his mind is racing, he is hearing voices and talking to himself constantly and will become paranoid that someone is going to get him. He does not sleep regularly and states he has never gotten a new doctor to get the medications refilled. He is feeling anxious and talks rapidly  Patient is a 30 y.o. male presenting with mental health disorder. The history is provided by the patient.  Mental Health Problem Presenting symptoms: delusional, disorganized thought process, hallucinations, paranoid behavior and suicidal thoughts   Patient accompanied by: noone. Degree of incapacity (severity):  Severe Onset quality:  Gradual Duration:  3 months Timing:  Constant Progression:  Worsening Context: noncompliance and stressful life event   Context: not alcohol use and not drug abuse   Treatment compliance:  Untreated Relieved by:  None tried Worsened by:  Lack of sleep Associated symptoms: anxiety, appetite change, decreased need for sleep and distractible   Associated symptoms: no feelings of worthlessness   Risk factors: hx of mental illness     Past Medical History  Diagnosis Date  . Depression   . Schizo affective schizophrenia ([name])   . Polysubstance abuse   . Scoliosis   . Chronic pain   . Noncompliance with medication regimen   . GERD (gastroesophageal reflux disease)   . Arthritis     "both hips" (12/21/2014)  . Asthma     Childhood  .  Chicken pox   . Diabetes mellitus     Type 1 diabetes  . DKA (diabetic ketoacidoses) ([name]) 11/18/2014  . Hypertension    Past Surgical History  Procedure Laterality Date  . Hand surgery Left     Left thumb surgery   Family History  Problem Relation Age of Onset  . Diabetes Mother    Social History  Substance Use Topics  . Smoking status: Current Every Day Smoker -- 1.00 packs/day for 7 years    Types: Cigarettes  . Smokeless tobacco: Never Used  . Alcohol Use: No     Comment: 12/21/2014 "I drink on a rare occasion"    Review of Systems  Constitutional: Positive for appetite change.  Psychiatric/Behavioral: Positive for suicidal ideas, hallucinations and paranoia. The patient is nervous/anxious.   All other systems reviewed and are negative.     Allergies  Sulfa antibiotics  Home Medications   Prior to Admission medications   Medication Sig Start Date End Date Taking? Authorizing Provider  acetaminophen (TYLENOL) 325 MG tablet Take 2 tablets (650 mg total) by mouth every 6 (six) hours as needed for mild pain, moderate pain, fever or headache (or Fever >/= 101). Patient not taking: Reported on 12/31/2015 12/24/15   Elease Etienne, MD  ARIPiprazole 400 MG SUSR Inject 400 mg into the muscle every 28 (twenty-eight) days. 12/27/15   Shari Prows, MD  insulin NPH-regular Human (NOVOLIN 70/30 RELION) (70-30) 100 UNIT/ML injection Inject 48 Units into the skin 2 (two) times daily with a meal. 12/27/15  Shari Prows, MD  QUEtiapine (SEROQUEL) 200 MG tablet Take 1 tablet (200 mg total) by mouth 3 (three) times daily. 12/27/15   Shari Prows, MD  traZODone (DESYREL) 100 MG tablet Take 1 tablet (100 mg total) by mouth at bedtime. 12/27/15   Jolanta B Pucilowska, MD   BP 124/89 mmHg  Pulse 100  Temp(Src) 97.4 F (36.3 C) (Oral)  Resp 18  Ht 6\' 1"  (1.854 m)  Wt 140 lb (63.504 kg)  BMI 18.47 kg/m2  SpO2 98% Physical Exam  Constitutional: He is oriented to person,  place, and time. He appears well-developed and well-nourished. No distress.  HENT:  Head: Normocephalic and atraumatic.  Mouth/Throat: Oropharynx is clear and moist.  Eyes: Conjunctivae and EOM are normal. Pupils are equal, round, and reactive to light.  Neck: Normal range of motion. Neck supple.  Cardiovascular: Regular rhythm and intact distal pulses.  Tachycardia present.   No murmur heard. Pulmonary/Chest: Effort normal and breath sounds normal. No respiratory distress. He has no wheezes. He has no rales.  Abdominal: Soft. He exhibits no distension. There is no tenderness. There is no rebound and no guarding.  Musculoskeletal: Normal range of motion. He exhibits no edema or tenderness.  Neurological: He is alert and oriented to person, place, and time.  Skin: Skin is warm and dry. No rash noted. No erythema.  Psychiatric: His mood appears anxious. His speech is rapid and/or pressured and tangential. He is actively hallucinating. Thought content is paranoid and delusional. He expresses impulsivity. He expresses suicidal ideation. He expresses no suicidal plans. He is inattentive.  Nursing note and vitals reviewed.   ED Course  Procedures (including critical care time) Labs Review Labs Reviewed  COMPREHENSIVE METABOLIC PANEL - Abnormal; Notable for the following:    Sodium 128 (*)    Chloride 88 (*)    Glucose, Bld 853 (*)    All other components within normal limits  ACETAMINOPHEN LEVEL - Abnormal; Notable for the following:    Acetaminophen (Tylenol), Serum <10 (*)    All other components within normal limits  CBC - Abnormal; Notable for the following:    MCHC 36.1 (*)    All other components within normal limits  CBG MONITORING, ED - Abnormal; Notable for the following:    Glucose-Capillary >600 (*)    All other components within normal limits  CBG MONITORING, ED - Abnormal; Notable for the following:    Glucose-Capillary 577 (*)    All other components within normal limits   CBG MONITORING, ED - Abnormal; Notable for the following:    Glucose-Capillary 384 (*)    All other components within normal limits  ETHANOL  SALICYLATE LEVEL  URINE RAPID DRUG SCREEN, HOSP PERFORMED  HEMOGLOBIN A1C    Imaging Review No results found. I have personally reviewed and evaluated these images and lab results as part of my medical decision-making.   EKG Interpretation None      MDM   Final diagnoses:  Hyperglycemia  Auditory hallucination  Paranoid ([name])    Patient is here for exacerbation of his mental illness due to not taking medications and a stressful life event. Patient is displaying signs of mania with decreased sleep, racing thoughts, hallucinating and paranoid. Patient is complaining of suicidal thoughts but denies any plan. Feel that patient would benefit from a TTS consult. Patient does have a history of diabetes but he states he is still been taking his diabetic medication however has not taken it today.  5:55  PM Patient was found to be hyperglycemic at 853. There is no evidence of DKA and anion gap is 13. Will start on glucose stabilizer to bring his blood sugar down. This is most likely from not taking his medication.  Spoke with TTS who will evaluate him.  7:53 PM Pt's BS now in the 300's.  Insulin gtt d/c and pt placed on sliding scale and moved to psych.  Pt currently medically clear  Gwyneth Sprout, MD 01/22/16 1954  Gwyneth Sprout, MD 01/22/16 9604
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
## 2016-01-22 NOTE — ED Notes (Signed)
Pt talking with TTS  

## 2016-01-22 NOTE — ED Notes (Signed)
Pt taking [place] tray after ok by PA. Tolerating well.

## 2016-01-23 LAB — BASIC METABOLIC PANEL
Anion gap: 11 (ref 5–15)
BUN: 13 mg/dL (ref 6–20)
CO2: 28 mmol/L (ref 22–32)
CREATININE: 0.63 mg/dL (ref 0.61–1.24)
Calcium: 8.8 mg/dL — ABNORMAL LOW (ref 8.9–10.3)
Chloride: 97 mmol/L — ABNORMAL LOW (ref 101–111)
Glucose, Bld: 439 mg/dL — ABNORMAL HIGH (ref 65–99)
Potassium: 4.3 mmol/L (ref 3.5–5.1)
SODIUM: 136 mmol/L (ref 135–145)

## 2016-01-23 LAB — CBG MONITORING, ED
GLUCOSE-CAPILLARY: 227 mg/dL — AB (ref 65–99)
GLUCOSE-CAPILLARY: 376 mg/dL — AB (ref 65–99)
Glucose-Capillary: 303 mg/dL — ABNORMAL HIGH (ref 65–99)
Glucose-Capillary: 287 mg/dL — ABNORMAL HIGH (ref 65–99)
Glucose-Capillary: 179 mg/dL — ABNORMAL HIGH (ref 65–99)

## 2016-01-23 MED ORDER — LORAZEPAM 1 MG PO TABS
1.0000 mg | ORAL_TABLET | Freq: Three times a day (TID) | ORAL | Status: DC | PRN
  Administered 2016-01-23 (×2): 1 mg via ORAL
  Filled 2016-01-23 (×2): qty 1

## 2016-01-23 NOTE — ED Notes (Signed)
Dinner tray brought to ED - placed at nurses' desk until pt calms. Bedside table and IV pole removed out of room for safety.

## 2016-01-23 NOTE — ED Notes (Signed)
Will administer Seroquel due at 1600 when pt awakens.

## 2016-01-23 NOTE — ED Notes (Signed)
CBG was 303 @ 1200.

## 2016-01-23 NOTE — ED Notes (Signed)
Per Karren Burly [phone] - pt accepted by Dr Josem Kaufmann - Report to be called in am and pt may be transported by [facility] after he eats breakfast in am. Pt does NOT have to be IVC'd.

## 2016-01-23 NOTE — ED Notes (Signed)
CBG was 287.

## 2016-01-23 NOTE — ED Notes (Addendum)
Pt returned from bathroom and laid down on bed - pressed call bell. RN gave pt pitcher and cup of ice water. Pt requesting coffee. Advised pt he may have it at 1000 and that he may drink water. Pt began talking louder and asking to see charge nurse. Advised pt of the policies. Pt continuously pressing call bell while RN in room w/pt. RN gave pt copy of [facility] policies w/snack times. Pt continues to yell for charge nurse. Security arrived and assisting. Dr Radford Pax aware and recommended for call bell to be removed if pt continues to press it d/t pt has sitter. Dr Radford Pax also aware of pt's BMET results.

## 2016-01-23 NOTE — ED Notes (Signed)
Pt given sandwich and decaf coffee x 2 w/crackers as requested. Pt has already eaten 1 sandwich and jello.

## 2016-01-23 NOTE — ED Notes (Signed)
Gave pt [place] sandwich (double the meat), sugar free jello x 2, crackers x 4, peanut butter x 2 and decaf coffee x 2. Voiced understanding next no other snack/food to be given until lunch.

## 2016-01-23 NOTE — ED Notes (Addendum)
Dr Corlis Leak aware of the following: Pt yelling, cursing and continuously pressing call bell wanting to speak w/charge nurse and EDP d/t upset d/t wants coffee. Advised pt he may have coffee when his dinner tray arrives. Pt continues w/yelling. Security arrived to bedside and assisting RN w/removing call bell. Pt refusing to listen and cooperate. States he wants to sign out AMA and leave. Pt is lying in bed w/head of bed elevated - is NOT attempting to get out of bed. Dr Corlis Leak aware pt is accepted to [facility] in am and she advised she will IVC pt if necessary.

## 2016-01-23 NOTE — ED Notes (Signed)
Pt resting quietly on bed w/eyes closed. Pt woke easily and took meds w/o difficulty. Pt being respectful to RN. Dinner tray given to pt w/decaf coffee x 2 cups.

## 2016-01-23 NOTE — ED Notes (Signed)
Pt transported to C21 via stretcher w/RN and sitter. Pt's 3 labeled belongings bags placed at nurses' desk for inventory. Pt is dressed in maroon-colored paper scrubs. Pt ambulated from stretcher in hallway to bed in room. Pt noted w/minimal eye contact - keeps eyes closed mostly. Pt asking for breakfast - breakfast tray given.

## 2016-01-23 NOTE — ED Notes (Signed)
CBG was 179.

## 2016-01-23 NOTE — ED Notes (Signed)
Shower supplies placed on counter.

## 2016-01-23 NOTE — ED Notes (Signed)
Pt w/D5%-0.45%NS infusing only.

## 2016-01-23 NOTE — BH Assessment (Signed)
Received call from [place] at [facility] who said Pt has been declined.  Harlin Rain Patsy [place], LPC, [facility] Triage Specialist [phone]

## 2016-01-23 NOTE — ED Notes (Addendum)
Per Dr Clayborne Dana, d/c D5-0.45%NS d/t CBG 376 and order BMP. Restart if CBG below 250. Per Epic, D5-0.4%NS stopped and 0.9%NS infusing however, pt had D5-0.45%NS infusing only.

## 2016-01-23 NOTE — ED Notes (Signed)
Pt ate 100% of breakfast and extra coffee given. Asking for Glucerna and more food. Advised pt he may have snack at 1000.

## 2016-01-23 NOTE — ED Notes (Signed)
Pt given 9pm snack and decaf coffee.

## 2016-01-23 NOTE — ED Notes (Signed)
CBG 227 

## 2016-01-24 LAB — HEMOGLOBIN A1C
Hgb A1c MFr Bld: 13.9 % — ABNORMAL HIGH (ref 4.8–5.6)
MEAN PLASMA GLUCOSE: 352 mg/dL

## 2016-01-24 LAB — CBG MONITORING, ED
Glucose-Capillary: 269 mg/dL — ABNORMAL HIGH (ref 65–99)
Glucose-Capillary: 369 mg/dL — ABNORMAL HIGH (ref 65–99)

## 2016-01-24 NOTE — ED Notes (Signed)
Patient refused to let us get V/S, The Nurse was informed.

## 2016-01-24 NOTE — ED Notes (Signed)
Patient CBG was 269, the Nurse was informed. Pt still refuse to let me get his V/S.

## 2016-01-24 NOTE — ED Notes (Signed)
Pt continues to refuse to allow staff to obtain vital signs - pt states he will not allow staff to do his vital signs until he receives his breakfast tray.

## 2016-01-24 NOTE — ED Notes (Signed)
Patient was given a snack and drink. 

## 2016-01-24 NOTE — ED Notes (Signed)
Amy EMT attempted to collect morning vitals from pt, pt refused.

## 2016-02-03 ENCOUNTER — Other Ambulatory Visit: Payer: Self-pay

## 2016-02-18 ENCOUNTER — Other Ambulatory Visit: Payer: Self-pay

## 2016-02-18 NOTE — Patient Outreach (Signed)
[facility]) Care Management  02/18/2016  WITT PLITT 01-01-1986 [id number]  Referral Date:  02/09/2016 Source:  [employer] tier 4 Issue:  Diabetes with 1 admission and 8 ER visits per data.  H/o 4 admissions and 4 ED visits over past 6 months per EPIC MR.  Outreach call #1 to patient.  Patient not reached.   RN CM left HIPAA compliant voice message with name and number for call back.  RN CM scheduled for next contact call within one week.   Donato Schultz, RN, BSN, [facility], CCM  [facility] [employer] Management Coordinator [phone] Direct [phone] Cell [phone] Office [phone] Fax

## 2016-02-21 ENCOUNTER — Other Ambulatory Visit: Payer: Self-pay

## 2016-02-21 NOTE — Patient Outreach (Signed)
[facility]) Care Management  02/21/2016  KEENA HEESCH March 11, 1986 [id number]   Referral Date: 02/09/2016 Source: [employer] tier 4 Issue: Diabetes with 1 admission and 8 ER visits per data.  Outreach call #2 to patient. Patient not reached.  H/o 4 admissions and 4 ED visits over past 6 months per EPIC MR.  RN CM left HIPAA compliant voice message with name and number for call back.  RN CM scheduled for next contact call within one week.   Donato Schultz, RN, BSN, [facility], CCM  [facility] [employer] Management Coordinator [phone] Direct [phone] Cell [phone] Office [phone] Fax

## 2016-02-22 ENCOUNTER — Other Ambulatory Visit: Payer: Self-pay

## 2016-02-22 NOTE — Patient Outreach (Signed)
[facility]) Care Management  02/22/2016  [name] 1986-08-20 [id number]   Referral Date: 02/09/2016 Source: [employer] tier 4 Issue: Diabetes with 1 admission and 8 ER visits per data.  Outreach call #3 to patient. Patient not reached.  H/o 4 admissions and 4 ED visits over past 6 months per EPIC MR.  RN CM left HIPAA compliant voice message with name and number for call back.  RN CM mailed unable to reach letter and will review case within 2 weeks for response.   Christopher Schultz, RN, BSN, [facility], CCM  [facility] [employer] Management Coordinator [phone] Direct [phone] Cell [phone] Office [phone] Fax

## 2016-02-26 ENCOUNTER — Emergency Department (HOSPITAL_COMMUNITY)
Admission: EM | Admit: 2016-02-26 | Discharge: 2016-02-26 | Disposition: A | Discharge status: Home / Self Care | Payer: PPO | Attending: Emergency Medicine | Admitting: Emergency Medicine

## 2016-02-26 ENCOUNTER — Encounter (HOSPITAL_COMMUNITY): Payer: Self-pay

## 2016-02-26 DIAGNOSIS — Z9119 Patient's noncompliance with other medical treatment and regimen: Secondary | ICD-10-CM

## 2016-02-26 DIAGNOSIS — Z9114 Patient's other noncompliance with medication regimen: Secondary | ICD-10-CM | POA: Insufficient documentation

## 2016-02-26 DIAGNOSIS — F259 Schizoaffective disorder, unspecified: Secondary | ICD-10-CM | POA: Diagnosis not present

## 2016-02-26 DIAGNOSIS — Z8619 Personal history of other infectious and parasitic diseases: Secondary | ICD-10-CM | POA: Diagnosis not present

## 2016-02-26 DIAGNOSIS — F329 Major depressive disorder, single episode, unspecified: Secondary | ICD-10-CM | POA: Insufficient documentation

## 2016-02-26 DIAGNOSIS — Z79899 Other long term (current) drug therapy: Secondary | ICD-10-CM | POA: Diagnosis not present

## 2016-02-26 DIAGNOSIS — R000 Tachycardia, unspecified: Secondary | ICD-10-CM | POA: Insufficient documentation

## 2016-02-26 DIAGNOSIS — F17210 Nicotine dependence, cigarettes, uncomplicated: Secondary | ICD-10-CM | POA: Insufficient documentation

## 2016-02-26 DIAGNOSIS — I951 Orthostatic hypotension: Secondary | ICD-10-CM | POA: Diagnosis not present

## 2016-02-26 DIAGNOSIS — D849 Immunodeficiency, unspecified: Secondary | ICD-10-CM | POA: Diagnosis not present

## 2016-02-26 DIAGNOSIS — F17200 Nicotine dependence, unspecified, uncomplicated: Secondary | ICD-10-CM

## 2016-02-26 DIAGNOSIS — Z8719 Personal history of other diseases of the digestive system: Secondary | ICD-10-CM | POA: Diagnosis not present

## 2016-02-26 DIAGNOSIS — R42 Dizziness and giddiness: Secondary | ICD-10-CM

## 2016-02-26 DIAGNOSIS — M419 Scoliosis, unspecified: Secondary | ICD-10-CM | POA: Diagnosis not present

## 2016-02-26 DIAGNOSIS — E875 Hyperkalemia: Secondary | ICD-10-CM | POA: Diagnosis not present

## 2016-02-26 DIAGNOSIS — I10 Essential (primary) hypertension: Secondary | ICD-10-CM | POA: Diagnosis not present

## 2016-02-26 DIAGNOSIS — E1065 Type 1 diabetes mellitus with hyperglycemia: Secondary | ICD-10-CM | POA: Insufficient documentation

## 2016-02-26 DIAGNOSIS — J45909 Unspecified asthma, uncomplicated: Secondary | ICD-10-CM | POA: Insufficient documentation

## 2016-02-26 DIAGNOSIS — Z794 Long term (current) use of insulin: Secondary | ICD-10-CM | POA: Diagnosis not present

## 2016-02-26 DIAGNOSIS — E871 Hypo-osmolality and hyponatremia: Secondary | ICD-10-CM

## 2016-02-26 DIAGNOSIS — R300 Dysuria: Secondary | ICD-10-CM | POA: Diagnosis not present

## 2016-02-26 DIAGNOSIS — M1990 Unspecified osteoarthritis, unspecified site: Secondary | ICD-10-CM | POA: Diagnosis not present

## 2016-02-26 DIAGNOSIS — R631 Polydipsia: Secondary | ICD-10-CM

## 2016-02-26 DIAGNOSIS — G8929 Other chronic pain: Secondary | ICD-10-CM | POA: Diagnosis not present

## 2016-02-26 DIAGNOSIS — E1069 Type 1 diabetes mellitus with other specified complication: Secondary | ICD-10-CM

## 2016-02-26 DIAGNOSIS — E860 Dehydration: Secondary | ICD-10-CM | POA: Insufficient documentation

## 2016-02-26 DIAGNOSIS — Z91199 Patient's noncompliance with other medical treatment and regimen due to unspecified reason: Secondary | ICD-10-CM

## 2016-02-26 LAB — BASIC METABOLIC PANEL
ANION GAP: 12 (ref 5–15)
Anion gap: 10 (ref 5–15)
BUN: 12 mg/dL (ref 6–20)
BUN: 11 mg/dL (ref 6–20)
CALCIUM: 9.2 mg/dL (ref 8.9–10.3)
CHLORIDE: 95 mmol/L — AB (ref 101–111)
CO2: 28 mmol/L (ref 22–32)
CO2: 25 mmol/L (ref 22–32)
CREATININE: 0.67 mg/dL (ref 0.61–1.24)
Calcium: 8.4 mg/dL — ABNORMAL LOW (ref 8.9–10.3)
Chloride: 89 mmol/L — ABNORMAL LOW (ref 101–111)
Creatinine, Ser: 0.95 mg/dL (ref 0.61–1.24)
GLUCOSE: 698 mg/dL — AB (ref 65–99)
Glucose, Bld: 346 mg/dL — ABNORMAL HIGH (ref 65–99)
POTASSIUM: 6.2 mmol/L — AB (ref 3.5–5.1)
POTASSIUM: 4.3 mmol/L (ref 3.5–5.1)
SODIUM: 129 mmol/L — AB (ref 135–145)
SODIUM: 130 mmol/L — AB (ref 135–145)

## 2016-02-26 LAB — URINALYSIS, ROUTINE W REFLEX MICROSCOPIC
Bilirubin Urine: NEGATIVE
Hgb urine dipstick: NEGATIVE
KETONES UR: 40 mg/dL — AB
LEUKOCYTES UA: NEGATIVE
Nitrite: NEGATIVE
PROTEIN: NEGATIVE mg/dL
Specific Gravity, Urine: 1.044 — ABNORMAL HIGH (ref 1.005–1.030)
pH: 6 (ref 5.0–8.0)

## 2016-02-26 LAB — URINE MICROSCOPIC-ADD ON
Bacteria, UA: NONE SEEN
WBC, UA: NONE SEEN WBC/hpf (ref 0–5)

## 2016-02-26 LAB — CBG MONITORING, ED
GLUCOSE-CAPILLARY: 341 mg/dL — AB (ref 65–99)
Glucose-Capillary: 485 mg/dL — ABNORMAL HIGH (ref 65–99)

## 2016-02-26 LAB — I-STAT VENOUS BLOOD GAS, ED
Acid-Base Excess: 2 mmol/L (ref 0.0–2.0)
BICARBONATE: 29.3 meq/L — AB (ref 20.0–24.0)
O2 Saturation: 62 %
TCO2: 31 mmol/L (ref 0–100)
pCO2, Ven: 54.2 mmHg — ABNORMAL HIGH (ref 45.0–50.0)
pH, Ven: 7.34 — ABNORMAL HIGH (ref 7.250–7.300)
pO2, Ven: 35 mmHg (ref 31.0–45.0)

## 2016-02-26 LAB — CBC WITH DIFFERENTIAL/PLATELET
BASOS ABS: 0 10*3/uL (ref 0.0–0.1)
BASOS PCT: 1 %
EOS ABS: 0.1 10*3/uL (ref 0.0–0.7)
Eosinophils Relative: 1 %
HCT: 42.8 % (ref 39.0–52.0)
Hemoglobin: 14.7 g/dL (ref 13.0–17.0)
Lymphocytes Relative: 14 %
Lymphs Abs: 0.9 10*3/uL (ref 0.7–4.0)
MCH: 30.1 pg (ref 26.0–34.0)
MCHC: 34.3 g/dL (ref 30.0–36.0)
MCV: 87.5 fL (ref 78.0–100.0)
MONO ABS: 0.3 10*3/uL (ref 0.1–1.0)
MONOS PCT: 5 %
NEUTROS PCT: 79 %
Neutro Abs: 5.2 10*3/uL (ref 1.7–7.7)
Platelets: 227 10*3/uL (ref 150–400)
RBC: 4.89 MIL/uL (ref 4.22–5.81)
RDW: 11.8 % (ref 11.5–15.5)
WBC: 6.5 10*3/uL (ref 4.0–10.5)

## 2016-02-26 LAB — MAGNESIUM: MAGNESIUM: 1.8 mg/dL (ref 1.7–2.4)

## 2016-02-26 LAB — PHOSPHORUS: PHOSPHORUS: 4.1 mg/dL (ref 2.5–4.6)

## 2016-02-26 LAB — I-STAT TROPONIN, ED: TROPONIN I, POC: 0 ng/mL (ref 0.00–0.08)

## 2016-02-26 MED ORDER — IBUPROFEN 400 MG PO TABS
400.0000 mg | ORAL_TABLET | Freq: Once | ORAL | Status: AC
  Administered 2016-02-26: 400 mg via ORAL
  Filled 2016-02-26: qty 1

## 2016-02-26 MED ORDER — SODIUM CHLORIDE 0.9 % IV BOLUS (SEPSIS)
2000.0000 mL | Freq: Once | INTRAVENOUS | Status: AC
  Administered 2016-02-26: 2000 mL via INTRAVENOUS

## 2016-02-26 MED ORDER — INSULIN ASPART 100 UNIT/ML ~~LOC~~ SOLN: 5.0000 [IU] | Freq: Once | SUBCUTANEOUS | Status: DC

## 2016-02-26 MED ORDER — SODIUM CHLORIDE 0.9 % IV BOLUS (SEPSIS)
1000.0000 mL | Freq: Once | INTRAVENOUS | Status: AC
  Administered 2016-02-26: 1000 mL via INTRAVENOUS

## 2016-02-26 MED ORDER — INSULIN ASPART 100 UNIT/ML ~~LOC~~ SOLN
5.0000 [IU] | Freq: Once | SUBCUTANEOUS | Status: AC
  Administered 2016-02-26: 5 [IU] via SUBCUTANEOUS
  Filled 2016-02-26: qty 1

## 2016-02-26 MED ORDER — QUETIAPINE FUMARATE 200 MG PO TABS
200.0000 mg | ORAL_TABLET | Freq: Once | ORAL | Status: AC
  Administered 2016-02-26: 200 mg via ORAL
  Filled 2016-02-26: qty 1

## 2016-02-26 MED ORDER — INSULIN ASPART PROT & ASPART (70-30 MIX) 100 UNIT/ML ~~LOC~~ SUSP: 5.0000 [IU] | Freq: Once | SUBCUTANEOUS | Status: DC

## 2016-02-26 MED ORDER — INSULIN NPH ISOPHANE & REGULAR (70-30) 100 UNIT/ML ~~LOC~~ SUSP: 40.0000 [IU] | Freq: Two times a day (BID) | SUBCUTANEOUS | Status: DC

## 2016-02-26 NOTE — ED Notes (Signed)
Gave pt a [place] sandwich w/ several packs of saltine crackers, w/ cup of coffee and splenda and creamer.

## 2016-02-26 NOTE — ED Notes (Signed)
Pt is being verbally abusive to staff and is cursing demanding food.  RN explained to pt that due to his complaint he could not receive any food at this time but RN would speak to PA.  PA to room to speak to pt.  PA explained to pt that he will not be able to eat until CBG is WNL.  Pt continues to curse and sts "I'm going to keep cussing and yelling until you give me food!"  Security called to bedside.

## 2016-02-26 NOTE — ED Notes (Signed)
Pt brought in EMS for hyperglycemia.  Pt is normally on Levemir 35 units every morning but has been out of insulin x2 weeks.  Pt reports he woke up this morning feeling weak and near syncopal.  Pt denies any n/v/d.  Pt did eat lunch.
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
## 2016-02-26 NOTE — Discharge Instructions (Signed)
Take your insulin as directed. Stay well hydrated with plenty of WATER. Follow up with your regular doctor in 2-3 days for ongoing management of your diabetes. Return to the ER for changes or worsening symptoms, but you need to be compliant on your medication regimen in order to avoid issues with your diabetes.    Type 1 Diabetes Mellitus, Adult Type 1 diabetes mellitus, often simply referred to as diabetes, is a long-term (chronic) disease. It occurs when the islet cells in the pancreas that make insulin (a hormone) are destroyed and can no longer make insulin. Insulin is needed to move sugars from food into the tissue cells. The tissue cells use the sugars for energy. In people with type 1 diabetes, the sugars build up in the blood instead of going into the tissue cells. As a result, high blood sugar (hyperglycemia) develops. Without insulin, the body breaks down fat cells for the needed energy. This breakdown of fat cells produces acid chemicals (ketones), which increases the acid levels in the body. The effect of either high ketone or high sugar (glucose) levels can be life-threatening.  Type 1 diabetes was also previously called juvenile diabetes. It most often occurs before the age of 38, but it can occur at any age. RISK FACTORS A person is predisposed to developing type 1 diabetes if someone in his or her family has the disease and is exposed to certain additional environmental triggers.  SYMPTOMS  Symptoms of type 1 diabetes may develop gradually over days to weeks or suddenly. The symptoms occur due to hyperglycemia. The symptoms can include:   Increased thirst (polydipsia).  Increased urination (polyuria).  Increased urination during the night (nocturia).  Weight loss. This weight loss may be rapid.  Frequent, recurring infections.  Tiredness (fatigue).  Weakness.  Vision changes, such as blurred vision.  Fruity smell to your breath.  Abdominal pain.  Nausea or  vomiting.  An open skin wound (ulcer). DIAGNOSIS  Type 1 diabetes is diagnosed when symptoms of diabetes are present and when blood glucose levels are increased. Your blood glucose level may be checked by one or more of the following blood tests:  A fasting blood glucose test. You will not be allowed to eat for at least 8 hours before a blood sample is taken.  A random blood glucose test. Your blood glucose is checked at any time of the day regardless of when you ate.  A hemoglobin A1c blood glucose test. A hemoglobin A1c test provides information about blood glucose control over the previous 3 months. TREATMENT  Although type 1 diabetes cannot be prevented, it can be managed with insulin, diet, and exercise.  You will need to take insulin daily to keep blood glucose in the desired range.  You will need to match insulin dosing with exercise and healthy food choices. Generally, the goal of treatment is to maintain a pre-meal (preprandial) blood glucose level of 80-130 mg/dL. HOME CARE INSTRUCTIONS   Have your hemoglobin A1c level checked twice a year.  Perform daily blood glucose monitoring as directed by your health care provider.  Monitor urine ketones when you are ill and as directed by your health care provider.  Take your insulin as directed by your health care provider to maintain your blood glucose level in the desired range.  Never run out of insulin. It is needed every day.  Adjust insulin based on your intake of carbohydrates. Carbohydrates can raise blood glucose levels but need to be included in your diet.  Carbohydrates provide vitamins, minerals, and fiber, which are an essential part of a healthy diet. Carbohydrates are found in fruits, vegetables, whole grains, dairy products, legumes, and foods containing added sugars.  Eat healthy foods. Alternate 3 meals with 3 snacks.  Maintain a healthy weight.  Carry a medical alert card or wear your medical alert  jewelry.  Carry a 15-gram carbohydrate snack with you at all times to treat low blood glucose (hypoglycemia). Some examples of 15-gram carbohydrate snacks include:  Glucose tablets, 3 or 4.  Glucose gel, 15-gram tube.  Raisins, 2 tablespoons (24 grams).  Jelly beans, 6.  Animal crackers, 8.  Fruit juice, regular soda, or low-fat milk, 4 ounces (120 mL).  Gummy treats, 9.  Recognize hypoglycemia. Hypoglycemia occurs with blood glucose levels of 70 mg/dL and below. The risk for hypoglycemia increases when fasting or skipping meals, during or after intense exercise, and during sleep. Hypoglycemia symptoms can include:  Tremors or shakes.  Decreased ability to concentrate.  Sweating.  Increased heart rate.  Headache.  Dry mouth.  Hunger.  Irritability.  Anxiety.  Restless sleep.  Altered speech or coordination.  Confusion.  Treat hypoglycemia promptly. If you are alert and able to safely swallow, follow the 15:15 rule:  Take 15-20 grams of rapid-acting glucose or carbohydrate. Rapid-acting options include glucose gel, glucose tablets, or 4 ounces (120 mL) of fruit juice, regular soda, or low-fat milk.  Check your blood glucose level 15 minutes after taking the glucose.  Take 15-20 grams more of glucose if the repeat blood glucose level is still 70 mg/dL or below.  Eat a meal or snack within 1 hour once blood glucose levels return to normal.  Be alert to polyuria and polydipsia, which are early signs of hyperglycemia. An early awareness of hyperglycemia allows for prompt treatment. Treat hyperglycemia as directed by your health care provider.  Exercise regularly as directed by your health care provider. This includes:  Stretching and performing strength training exercises, such as yoga or weight lifting, at least 2 times per week.  Performing a total of at least 150 minutes of moderate-intensity exercise each week, such as brisk walking or water  aerobics.  Exercising at least 3 days per week, making sure you allow no more than 2 consecutive days to pass without exercising.  Avoiding long periods of inactivity (90 minutes or more). When you have to spend an extended period of time sitting down, take frequent breaks to walk or stretch.  Adjust your insulin dosing and food intake as needed if you start a new exercise or sport.  Follow your sick-day plan at any time you are unable to eat or drink as usual.   Do not use any tobacco products including cigarettes, chewing tobacco, or electronic cigarettes. If you need help quitting, ask your health care provider.  Limit alcohol intake to no more than 1 drink per day for nonpregnant women and 2 drinks per day for men. You should drink alcohol only when you are also eating food. Talk with your health care provider about whether alcohol is safe for you. Tell your health care provider if you drink alcohol several times a week.  Keep all follow-up visits as directed by your health care provider.  Schedule an eye exam within 5 years of diagnosis and then annually.  Perform daily skin and foot care. Examine your skin and feet daily for cuts, bruises, redness, nail problems, bleeding, blisters, or sores. A foot exam should be done by a health  care provider 5 years after diagnosis, and then every year after the first exam.  Brush your teeth and gums at least twice a day and floss at least once a day. Follow up with your dentist regularly.  Share your diabetes management plan with your workplace or school.  Keep your immunizations up to date. It is recommended that you receive a flu (influenza) vaccine every year. It is also recommended that you receive a pneumonia (pneumococcal) vaccine. If you are 38 years of age or older and have never received a pneumonia vaccine, this vaccine may be given as a series of two separate shots. Ask your health care provider which additional vaccines may be  recommended.  Learn to manage stress.  Obtain ongoing diabetes education and support as needed.  Participate in or seek rehabilitation as needed to maintain or improve independence and quality of life. Request a physical or occupational therapy referral if you are having foot or hand numbness, or difficulties with grooming, dressing, eating, or physical activity. SEEK MEDICAL CARE IF:   You are unable to eat food or drink fluids for more than 6 hours.  You have nausea and vomiting for more than 6 hours.  Your blood glucose level is over 240 mg/dL.  There is a change in mental status.  You develop an additional serious illness.  You have diarrhea for more than 6 hours.  You have been sick or have had a fever for a couple of days and are not getting better.  You have pain during any physical activity. SEEK IMMEDIATE MEDICAL CARE IF:  You have difficulty breathing.  You have moderate to large ketone levels. MAKE SURE YOU:  Understand these instructions.  Will watch your condition.  Will get help right away if you are not doing well or get worse.   This information is not intended to replace advice given to you by your health care provider. Make sure you discuss any questions you have with your health care provider.   Document Released: 12/01/2000 Document Revised: 08/25/2015 Document Reviewed: 07/02/2012 Elsevier Interactive Patient Education 2016 [employer]  Hyperglycemia Hyperglycemia occurs when the glucose (sugar) in your blood is too high. Hyperglycemia can happen for many reasons, but it most often happens to people who do not know they have diabetes or are not managing their diabetes properly.  CAUSES  Whether you have diabetes or not, there are other causes of hyperglycemia. Hyperglycemia can occur when you have diabetes, but it can also occur in other situations that you might not be as aware of, such as: Diabetes  If you have diabetes and are having problems  controlling your blood glucose, hyperglycemia could occur because of some of the following reasons:  Not following your meal plan.  Not taking your diabetes medications or not taking it properly.  Exercising less or doing less activity than you normally do.  Being sick. Pre-diabetes  This cannot be ignored. Before people develop Type 2 diabetes, they almost always have "pre-diabetes." This is when your blood glucose levels are higher than normal, but not yet high enough to be diagnosed as diabetes. Research has shown that some long-term damage to the body, especially the heart and circulatory system, may already be occurring during pre-diabetes. If you take action to manage your blood glucose when you have pre-diabetes, you may delay or prevent Type 2 diabetes from developing. Stress  If you have diabetes, you may be "diet" controlled or on oral medications or insulin to control your  diabetes. However, you may find that your blood glucose is higher than usual in the hospital whether you have diabetes or not. This is often referred to as "stress hyperglycemia." Stress can elevate your blood glucose. This happens because of hormones put out by the body during times of stress. If stress has been the cause of your high blood glucose, it can be followed regularly by your caregiver. That way he/she can make sure your hyperglycemia does not continue to get worse or progress to diabetes. Steroids  Steroids are medications that act on the infection fighting system (immune system) to block inflammation or infection. One side effect can be a rise in blood glucose. Most people can produce enough extra insulin to allow for this rise, but for those who cannot, steroids make blood glucose levels go even higher. It is not unusual for steroid treatments to "uncover" diabetes that is developing. It is not always possible to determine if the hyperglycemia will go away after the steroids are stopped. A special blood  test called an A1c is sometimes done to determine if your blood glucose was elevated before the steroids were started. SYMPTOMS  Thirsty.  Frequent urination.  Dry mouth.  Blurred vision.  Tired or fatigue.  Weakness.  Sleepy.  Tingling in feet or leg. DIAGNOSIS  Diagnosis is made by monitoring blood glucose in one or all of the following ways:  A1c test. This is a chemical found in your blood.  Fingerstick blood glucose monitoring.  Laboratory results. TREATMENT  First, knowing the cause of the hyperglycemia is important before the hyperglycemia can be treated. Treatment may include, but is not be limited to:  Education.  Change or adjustment in medications.  Change or adjustment in meal plan.  Treatment for an illness, infection, etc.  More frequent blood glucose monitoring.  Change in exercise plan.  Decreasing or stopping steroids.  Lifestyle changes. HOME CARE INSTRUCTIONS   Test your blood glucose as directed.  Exercise regularly. Your caregiver will give you instructions about exercise. Pre-diabetes or diabetes which comes on with stress is helped by exercising.  Eat wholesome, balanced meals. Eat often and at regular, fixed times. Your caregiver or nutritionist will give you a meal plan to guide your sugar intake.  Being at an ideal weight is important. If needed, losing as little as 10 to 15 pounds may help improve blood glucose levels. SEEK MEDICAL CARE IF:   You have questions about medicine, activity, or diet.  You continue to have symptoms (problems such as increased thirst, urination, or weight gain). SEEK IMMEDIATE MEDICAL CARE IF:   You are vomiting or have diarrhea.  Your breath smells fruity.  You are breathing faster or slower.  You are very sleepy or incoherent.  You have numbness, tingling, or pain in your feet or hands.  You have chest pain.  Your symptoms get worse even though you have been following your caregiver's  orders.  If you have any other questions or concerns.   This information is not intended to replace advice given to you by your health care provider. Make sure you discuss any questions you have with your health care provider.   Document Released: 05/30/2001 Document Revised: 02/26/2012 Document Reviewed: 08/10/2015 Elsevier Interactive Patient Education 2016 [employer].  How and Where to Give Subcutaneous Insulin Injections, Adult People with type 1 diabetes must take insulin since their bodies do not make it. People with type 2 diabetes may require insulin. There are many different types of  insulin as well as other injectable diabetes medicines that are meant to be injected into the fat layer under your skin. The type of insulin or injectable diabetes medicine you take may determine how many injections you give yourself and when to take the injections.  CHOOSING A SITE FOR INJECTION Insulin absorption varies from site to site. As with any injectable medication it is best for the insulin to be injected within the same body region. However, do not inject the insulin in the same spot each time. Rotating the spots you give your injections will prevent inflammation or tissue breakdown. There are four main regions that can be used for injections. The regions include the:  Abdomen (preferred region, especially for non-insulin injectable diabetes medicine).  Front and upper outer sides of thighs.  Back of upper arm.  Buttocks. USING A SYRINGE AND VIAL Drawing up insulin: single insulin dose  Wash your hands with soap and water.  Gently roll the insulin bottle (vial) between your hands to mix it. Do not shake the vial.  Clean the top rubber part of the vial with an alcohol wipe. Be sure that the plastic pop-top has been removed on newer vials.  Remove the plastic cover from the needle on the syringe. Do not let the needle touch anything.  Pull the plunger back to draw air into the  syringe. The air should be the same amount as the insulin dose.  Push the needle through the rubber on the top of the vial. Do not turn the vial over.  Push the plunger in all the way to put the air into the vial.  Leave the needle in the vial and turn the vial and syringe upside down.  Pull down slowly on the plunger, drawing the amount of insulin you need into the syringe.  Look for air bubbles in the syringe. You may need to push the plunger up and down 2 to 3 times to slowly get rid of any air bubbles in the syringe.  Pull back the plunger to get your correct dose.  Remove the needle from the vial.  Use an alcohol wipe to clean the area of the body to be injected.  Pinch up 1 inch of skin and hold it.  Put the needle straight into the skin (90-degree angle). Put the needle in as far as it will go (to the hub). The needle may need to be injected at a 45-degree angle in small adults with little fat.  When the needle is in, you can let go of your skin.  Push the plunger down all the way to inject the insulin.  Pull the needle straight out of the skin.  Press the alcohol wipe over the spot where you gave your injection. Keep it there for a few seconds. Do not rub the area.  Do not put the plastic cover back on the needle. Drawing up insulin: mixing 2 insulins  Wash your hands with soap and water.  Gently roll the vial of "cloudy" insulin between your hands or rotate the vial from top to bottom to mix.  Clean the top of both vials with an alcohol wipe. Be sure that the plastic pop-top lid has been removed on newer vials.  Pull air into the syringe to equal the dose of "cloudy" insulin.  Stick the needle into the "cloudy" insulin vial and inject the air. Be sure to keep the vial upright.  Remove the needle from the "cloudy" insulin vial.  Pull air  into the syringe to equal the dose of "clear" insulin.  Stick the needle into the "clear" insulin vial and inject the  air.  Leave the needle in the "clear" insulin vial and turn the vial upside down.  Pull down on the plunger and slowly draw into the syringe the number of units of "clear" insulin desired.  Look for air bubbles in the syringe. You may need to push the plunger up and down 2 to 3 times to slowly get rid of any air bubbles in the syringe.  Remove the needle from the "clear" insulin vial.  Stick the needle into the "cloudy" insulin vial. Do not inject any of the "clear" insulin into the "cloudy" vial.  Turn the "cloudy" vial upside down and pull the plunger down to the number of units that equals the total number of units of "clear" and "cloudy" insulins.  Remove the needle from the "cloudy" insulin vial.  Use an alcohol wipe to clean the area of the body to be injected.  Put the needle straight into the skin (90-degree angle). Put the needle in as far as it will go (to the hub). The needle may need to be injected at a 45-degree angle in small adults with little fat.  When the needle is in, you can let go of your skin.  Push the plunger down all the way to inject the insulin.  Pull the needle straight out of the skin.  Press the alcohol wipe over the spot where you gave your injection. Keep it there for a few seconds. Do not rub the area.  Do not put the plastic cover back on the needle. USING INSULIN PENS  Wash your hands with soap and water.  If you are using the "cloudy" insulin, roll the pen between your palms several times or rotate the pen top to bottom several times.  Remove the insulin pen cap.  Clean the rubber stopper of the cartridge with an alcohol wipe.  Remove the protective paper tab from the disposable needle.  Screw the needle onto the pen.  Remove the outer plastic needle cover.  Remove the inner plastic needle cover.  Prime the insulin pen by turning the button (dial) to 2 units. Hold the pen with the needle pointing up, and push the dial on the opposite  end until a drop of insulin appears at the needle tip. If no insulin appears, repeat this step.  Dial the number of units of insulin you will inject.  Use an alcohol wipe to clean the area of the body to be injected.  Pinch up 1 inch of skin and hold it.  Put the needle straight into the skin (90-degree angle).  Push the dial down to push the insulin into the fat tissue.  Count to 10 slowly. Then, remove the needle from the fat tissue.  Carefully replace the larger outer plastic needle cover over the needle and unscrew the capped needle. THROWING AWAY SUPPLIES  Discard used needles in a puncture proof sharps disposal container. Follow disposal regulations for the area where you live.  Vials and empty disposable pens may be thrown away in the regular trash.   This information is not intended to replace advice given to you by your health care provider. Make sure you discuss any questions you have with your health care provider.   Document Released: 02/24/2004 Document Revised: 12/25/2014 Document Reviewed: 05/13/2013 Elsevier Interactive Patient Education 2016 [employer].  How to Avoid Diabetes Problems You can  do a lot to prevent or slow down diabetes problems. Following your diabetes plan and taking care of yourself can reduce your risk of serious or life-threatening complications. Below, you will find certain things you can do to prevent diabetes problems. MANAGE YOUR DIABETES Follow your health care provider's, nurse educator's, and dietitian's instructions for managing your diabetes. They will teach you the basics of diabetes care. They can help answer questions you may have. Learn about diabetes and make healthy choices regarding eating and physical activity. Monitor your blood glucose level regularly. Your health care provider will help you decide how often to check your blood glucose level depending on your treatment goals and how well you are meeting them.  DO NOT USE  NICOTINE Nicotine and diabetes are a dangerous combination. Nicotine raises your risk for diabetes problems. If you quit using nicotine, you will lower your risk for heart attack, stroke, nerve disease, and kidney disease. Your cholesterol and your blood pressure levels may improve. Your blood circulation will also improve. Do not use any tobacco products, including cigarettes, chewing tobacco, or electronic cigarettes. If you need help quitting, ask your health care provider. KEEP YOUR BLOOD PRESSURE UNDER CONTROL Your health care provider will determine your individualized target blood pressure based on your age, your medicines, how long you have had diabetes, and any other medical conditions you have. Blood pressure consists of two numbers. Generally, the goal is to keep your top number (systolic pressure) at or below 130, and your bottom number (diastolic pressure) at or below 80. Your health care provider may recommend a lower target blood pressure reading, if appropriate. Meal planning, medicines, and exercise can help you reach your target blood pressure. Make sure your health care provider checks your blood pressure at every visit. KEEP YOUR CHOLESTEROL UNDER CONTROL Normal cholesterol levels will help prevent heart disease and stroke. These are the biggest health problems for people with diabetes. Keeping cholesterol levels under control can also help with blood flow. Have your cholesterol level checked at least once a year. Your health care provider may prescribe a medicine known as a statin. Statins lower your cholesterol. If you are not taking a statin, ask your health care provider if you should be. Meal planning, exercise, and medicines can help you reach your cholesterol targets.  SCHEDULE AND KEEP YOUR ANNUAL PHYSICAL EXAMS AND EYE EXAMS Your health care provider will tell you how often he or she wants to see you depending on your plan of treatment. It is important that you keep these  appointments so that possible problems can be identified early and complications can be avoided or treated.  Every visit with your health care provider should include your weight, blood pressure, and an evaluation of your blood glucose control.  Your hemoglobin A1c should be checked:  At least twice a year if you are at your goal.  Every 3 months if there are changes in treatment.  If you are not meeting your goals.  Your blood lipids should be checked yearly. You should also be checked yearly to see if you have protein in your urine (microalbumin).  Schedule a dilated eye exam within 5 years of your diagnosis if you have type 1 diabetes, and then yearly. Schedule a dilated eye exam at diagnosis if you have type 2 diabetes, and then yearly. All exams thereafter can be extended to every 2 to 3 years if one or more exams have been normal. KEEP YOUR VACCINES CURRENT It is recommended that  you receive a flu (influenza) vaccine every year. It is also recommended that you receive a pneumonia (pneumococcal) vaccine. If you are 14 years of age or older and have never received a pneumonia vaccine, this vaccine may be given as a series of two separate shots. Ask your health care provider which additional vaccines may be recommended. TAKE CARE OF YOUR FEET  Diabetes may cause you to have a poor blood supply (circulation) to your legs and feet. Because of this, the skin may be thinner, break easier, and heal more slowly. You also may have nerve damage in your legs and feet, causing decreased feeling. You may not notice minor injuries to your feet that could lead to serious problems or infections. Taking care of your feet is very important. Visual foot exams are performed at every routine medical visit. The exams check for cuts, injuries, or other problems with the feet. A comprehensive foot exam should be done yearly. This includes visual inspection as well as assessing foot pulses and testing for loss of  sensation. You should also do the following:  Inspect your feet daily for cuts, calluses, blisters, ingrown toenails, and signs of infection, such as redness, swelling, or pus.  Wash and dry your feet thoroughly, especially between the toes.  Avoid soaking your feet regularly in hot water baths.  Moisturize dry skin with lotion, avoiding areas between your toes.  Cut toenails straight across and file the edges.  Avoid shoes that do not fit well or have areas that irritate your skin.  Avoid going barefooted or wearing only socks. Your feet need protection. TAKE CARE OF YOUR TEETH People with poorly controlled diabetes are more likely to have gum (periodontal) disease. These infections make diabetes harder to control. Periodontal diseases, if left untreated, can lead to tooth loss. Brush your teeth twice a day, floss, and see your dentist for checkups and cleaning every 6 months, or 2 times a year. ASK YOUR HEALTH CARE PROVIDER ABOUT TAKING ASPIRIN Taking aspirin daily is recommended to help prevent cardiovascular disease in people with and without diabetes. Ask your health care provider if this would benefit you and what dose he or she would recommend. DRINK RESPONSIBLY Moderate amounts of alcohol (less than 1 drink per day for adult women and less than 2 drinks per day for adult men) have a minimal effect on blood glucose if ingested with food. It is important to eat food with alcohol to avoid hypoglycemia. People should avoid alcohol if they have a history of alcohol abuse or dependence, if they are pregnant, and if they have liver disease, pancreatitis, advanced neuropathy, or severe hypertriglyceridemia. LESSEN STRESS Living with diabetes can be stressful. When you are under stress, your blood glucose may be affected in two ways:  Stress hormones may cause your blood glucose to rise.  You may be distracted from taking good care of yourself. It is a good idea to be aware of your stress  level and make changes that are necessary to help you better manage challenging situations. Support groups, planned relaxation, a hobby you enjoy, meditation, healthy relationships, and exercise all work to lower your stress level. If your efforts do not seem to be helping, get help from your health care provider or a trained mental health professional.   This information is not intended to replace advice given to you by your health care provider. Make sure you discuss any questions you have with your health care provider.   Document Released: 08/22/2011 Document  Revised: 12/25/2014 Document Reviewed: 01/28/2014 Elsevier Interactive Patient Education 2016 Elsevier Inc.  Rehydration, Adult Rehydration is the replacement of body fluids lost during dehydration. Dehydration is an extreme loss of body fluids to the point of body function impairment. There are many ways extreme fluid loss can occur, including vomiting, diarrhea, or excess sweating. Recovering from dehydration requires replacing lost fluids, continuing to eat to maintain strength, and avoiding foods and beverages that may contribute to further fluid loss or may increase nausea. HOW TO REHYDRATE In most cases, rehydration involves the replacement of not only fluids but also carbohydrates and basic body salts. Rehydration with an oral rehydration solution is one way to replace essential nutrients lost through dehydration. An oral rehydration solution can be purchased at pharmacies, retail stores, and online. Premixed packets of powder that you combine with water to make a solution are also sold. You can prepare an oral rehydration solution at home by mixing the following ingredients together:    - tsp table salt.   tsp baking soda.   tsp salt substitute containing potassium chloride.  1 tablespoons sugar.  1 L (34 oz) of water. Be sure to use exact measurements. Including too much sugar can make diarrhea worse. Drink -1 cup (120-240  mL) of oral rehydration solution each time you have diarrhea or vomit. If drinking this amount makes your vomiting worse, try drinking smaller amounts more often. For example, drink 1-3 tsp every 5-10 minutes.  A general rule for staying hydrated is to drink 1-2 L of fluid per day. Talk to your caregiver about the specific amount you should be drinking each day. Drink enough fluids to keep your urine clear or pale yellow. EATING WHEN DEHYDRATED Even if you have had severe sweating or you are having diarrhea, do not stop eating. Many healthy items in a normal diet are okay to continue eating while recovering from dehydration. The following tips can help you to lessen nausea when you eat:  Ask someone else to prepare your food. Cooking smells may worsen nausea.  Eat in a well-ventilated room away from cooking smells.  Sit up when you eat. Avoid lying down until 1-2 hours after eating.  Eat small amounts when you eat.  Eat foods that are easy to digest. These include soft, well-cooked, or mashed foods. FOODS AND BEVERAGES TO AVOID Avoid eating or drinking the following foods and beverages that may increase nausea or further loss of fluid:   Fruit juices with a high sugar content, such as concentrated juices.  Alcohol.  Beverages containing caffeine.  Carbonated drinks. They may cause a lot of gas.  Foods that may cause a lot of gas, such as cabbage, broccoli, and beans.  Fatty, greasy, and fried foods.  Spicy, very salty, and very sweet foods or drinks.  Foods or drinks that are very hot or very cold. Consume food or drinks at or near room temperature.  Foods that need a lot of chewing, such as raw vegetables.  Foods that are sticky or hard to swallow, such as peanut butter.   This information is not intended to replace advice given to you by your health care provider. Make sure you discuss any questions you have with your health care provider.   Document Released: 02/26/2012  Document Revised: 08/28/2012 Document Reviewed: 02/26/2012 Elsevier Interactive Patient Education [employer]

## 2016-02-26 NOTE — ED Notes (Signed)
Pt requesting Ativan for sleep and his daily meds.  Explained I would speak to PA regarding medications. PA is at bedside at this time.

## 2016-02-26 NOTE — ED Provider Notes (Signed)
CSN: [id number]     Arrival date & time 02/26/16  1145 History   First MD Initiated Contact with Patient 02/26/16 1147     Chief Complaint  Patient presents with  . Hyperglycemia     (Consider location/radiation/quality/duration/timing/severity/associated sxs/prior Treatment) HPI Comments: [name] is a 30 y.o. male with a PMHx of chronic pain, schizoaffective schizophrenia, depression, polysubstance abuse, DM1 noncompliant with his home medications, scoliosis, GERD, asthma, hip arthritis, HTN, and prior admissions for DKA, who presents to the ED with complaints of fatigue/generalized weakness this morning when he got out of bed. He states that upon standing he felt lightheaded. He has felt thirstier over the last several weeks, and admits that he has been out of his insulin 2 weeks. He states that he was previously on Levemir 35 units every morning he ran out and has not gotten it refilled. He states that he has not been checking his blood sugars at home, in route EMS reported his blood sugar as 527 but upon arrival the CBG was read as "high". He states that he went back to sleep and when he woke up later in the morning he felt somewhat better, went to lunch and had a sandwich, mashed potatoes, and orange juice around 10 AM, but was still feeling somewhat fatigued, so he called an ambulance to bring him here. He also reports that he has had some "burning" dysuria occasionally.  He denies any fevers, chills, polyuria, chest pain, shortness of breath, abdominal pain, nausea, vomiting, diarrhea, constipation, melena, hematochezia, hematuria, urinary frequency or urgency, penile discharge, testicular pain or swelling, numbness, tingling, weakness, recent travel/surgeries/immobilization, history of DVT/PE, or any neurological deficits. His PCP is Dorrene German, MD of [facility] in [place].  Patient is a 30 y.o. male presenting with hyperglycemia. The history is provided by the patient and  medical records. No language interpreter was used.  Hyperglycemia Blood sugar level PTA:  527 Severity:  Moderate Onset quality:  Unable to specify Timing:  Unable to specify Progression:  Unable to specify Chronicity:  Recurrent Diabetes status:  Controlled with insulin Current diabetic therapy:  Levemir 35U qAM Time since last antidiabetic medication:  2 weeks Context: noncompliance   Relieved by:  None tried Ineffective treatments:  None tried Associated symptoms: dysuria ("some burning"), fatigue and increased thirst   Associated symptoms: no abdominal pain, no chest pain, no confusion, no fever, no nausea, no polyuria, no shortness of breath, no vomiting and no weakness   Risk factors: hx of DKA     Past Medical History  Diagnosis Date  . Depression   . Schizo affective schizophrenia ([name])   . Polysubstance abuse   . Scoliosis   . Chronic pain   . Noncompliance with medication regimen   . GERD (gastroesophageal reflux disease)   . Arthritis     "both hips" (12/21/2014)  . Asthma     Childhood  . Chicken pox   . Diabetes mellitus     Type 1 diabetes  . DKA (diabetic ketoacidoses) ([name]) 11/18/2014  . Hypertension    Past Surgical History  Procedure Laterality Date  . Hand surgery Left     Left thumb surgery   Family History  Problem Relation Age of Onset  . Diabetes Mother    Social History  Substance Use Topics  . Smoking status: Current Every Day Smoker -- 1.00 packs/day for 7 years    Types: Cigarettes  . Smokeless tobacco: Never Used  . Alcohol Use:  No     Comment: 12/21/2014 "I drink on a rare occasion"    Review of Systems  Constitutional: Positive for fatigue. Negative for fever and chills.  Respiratory: Negative for shortness of breath.   Cardiovascular: Negative for chest pain.  Gastrointestinal: Negative for nausea, vomiting, abdominal pain, diarrhea, constipation and blood in stool.  Endocrine: Positive for polydipsia. Negative for polyuria.   Genitourinary: Positive for dysuria ("some burning"). Negative for urgency, frequency, hematuria, flank pain, discharge, scrotal swelling and testicular pain.  Musculoskeletal: Negative for myalgias and arthralgias.  Skin: Negative for color change.  Allergic/Immunologic: Positive for immunocompromised state (diabetic).  Neurological: Positive for light-headedness (with standing). Negative for weakness and numbness.  Psychiatric/Behavioral: Negative for confusion.   10 Systems reviewed and are negative for acute change except as noted in the HPI.    Allergies  Sulfa antibiotics  Home Medications   Prior to Admission medications   Medication Sig Start Date End Date Taking? Authorizing Provider  acetaminophen (TYLENOL) 325 MG tablet Take 2 tablets (650 mg total) by mouth every 6 (six) hours as needed for mild pain, moderate pain, fever or headache (or Fever >/= 101). 12/24/15   Elease Etienne, MD  amphetamine-dextroamphetamine (ADDERALL) 20 MG tablet Take 20 mg by mouth 2 (two) times daily.    Historical Provider, MD  ARIPiprazole 400 MG SUSR Inject 400 mg into the muscle every 28 (twenty-eight) days. Patient not taking: Reported on 01/22/2016 12/27/15   Shari Prows, MD  insulin NPH-regular Human (NOVOLIN 70/30 RELION) (70-30) 100 UNIT/ML injection Inject 48 Units into the skin 2 (two) times daily with a meal. 12/27/15   Jolanta B Pucilowska, MD  QUEtiapine (SEROQUEL) 200 MG tablet Take 1 tablet (200 mg total) by mouth 3 (three) times daily. 12/27/15   Shari Prows, MD  traZODone (DESYREL) 100 MG tablet Take 1 tablet (100 mg total) by mouth at bedtime. 12/27/15   Jolanta B Pucilowska, MD   BP 126/80 mmHg  Pulse 115  Temp(Src) 97.9 F (36.6 C) (Oral)  Resp 18  Ht 6\' 1"  (1.854 m)  Wt 63.504 kg  BMI 18.47 kg/m2  SpO2 98% Physical Exam  Constitutional: He is oriented to person, place, and time. Vital signs are normal. He appears well-developed and well-nourished.  Non-toxic  appearance. No distress.  Afebrile, nontoxic, NAD  HENT:  Head: Normocephalic and atraumatic.  Mouth/Throat: Oropharynx is clear and moist. Mucous membranes are dry.  Dry mucous membranes  Eyes: Conjunctivae and EOM are normal. Pupils are equal, round, and reactive to light. Right eye exhibits no discharge. Left eye exhibits no discharge.  Neck: Normal range of motion. Neck supple.  Cardiovascular: Regular rhythm, normal heart sounds and intact distal pulses.  Tachycardia present.  Exam reveals no gallop and no friction rub.   No murmur heard. Tachycardic in the 110s, reg rhythm, nl s1/s2, no m/r/g, distal pulses intact, no pedal edema  Pulmonary/Chest: Effort normal and breath sounds normal. No respiratory distress. He has no decreased breath sounds. He has no wheezes. He has no rhonchi. He has no rales.  Abdominal: Soft. Normal appearance and bowel sounds are normal. He exhibits no distension. There is no tenderness. There is no rigidity, no rebound, no guarding, no CVA tenderness, no tenderness at McBurney's point and negative Murphy's sign.  Musculoskeletal: Normal range of motion.  MAE x4 Strength and sensation grossly intact Distal pulses intact Gait steady No pedal edema  Neurological: He is alert and oriented to person, place, and time.  He has normal strength. No cranial nerve deficit or sensory deficit. Coordination and gait normal. GCS eye subscore is 4. GCS verbal subscore is 5. GCS motor subscore is 6.  No focal neuro deficits  Skin: Skin is warm, dry and intact. No rash noted.  Psychiatric: He has a normal mood and affect.  Nursing note and vitals reviewed.   ED Course  Procedures (including critical care time) Labs Review Labs Reviewed  BASIC METABOLIC PANEL - Abnormal; Notable for the following:    Sodium 129 (*)    Potassium 6.2 (*)    Chloride 89 (*)    Glucose, Bld 698 (*)    All other components within normal limits  URINALYSIS, ROUTINE W REFLEX MICROSCOPIC (NOT  AT [facility]) - Abnormal; Notable for the following:    Specific Gravity, Urine 1.044 (*)    Glucose, UA >1000 (*)    Ketones, ur 40 (*)    All other components within normal limits  URINE MICROSCOPIC-ADD ON - Abnormal; Notable for the following:    Squamous Epithelial / LPF 0-5 (*)    All other components within normal limits  CBG MONITORING, ED - Abnormal; Notable for the following:    Glucose-Capillary >600 (*)    All other components within normal limits  I-STAT VENOUS BLOOD GAS, ED - Abnormal; Notable for the following:    pH, Ven 7.340 (*)    pCO2, Ven 54.2 (*)    Bicarbonate 29.3 (*)    All other components within normal limits  CBG MONITORING, ED - Abnormal; Notable for the following:    Glucose-Capillary 485 (*)    All other components within normal limits  CBG MONITORING, ED - Abnormal; Notable for the following:    Glucose-Capillary 341 (*)    All other components within normal limits  URINE CULTURE  CBC WITH DIFFERENTIAL/PLATELET  MAGNESIUM  PHOSPHORUS  BASIC METABOLIC PANEL  I-STAT TROPOININ, ED  GC/CHLAMYDIA PROBE AMP ([place]) NOT AT [facility]    Imaging Review No results found. I have personally reviewed and evaluated these images and lab results as part of my medical decision-making.   EKG Interpretation   Date/Time:  Saturday February 26 2016 12:47:02 EST Ventricular Rate:  105 PR Interval:  152 QRS Duration: 82 QT Interval:  351 QTC Calculation: 464 R Axis:   -19 Text Interpretation:  Sinus tachycardia Borderline left axis deviation ST  elev, probable normal early repol pattern Baseline wander in lead(s) V4 No  significant change since last tracing Confirmed by LITTLE MD, RACHEL  [phone]) on 02/26/2016 1:11:22 PM      MDM   Final diagnoses:  Type 1 diabetes mellitus with hyperosmolarity without coma ([name])  Hyperkalemia, transcellular shifts  Hyponatremia  Dehydration  Tobacco use disorder  Orthostatic lightheadedness  Dysuria  Polydipsia   Hyperglycemia due to type 1 diabetes mellitus ([name])  Noncompliance with diabetes treatment  Tachycardia    30 y.o. male here with generalized fatigue/weakness this morning with lightheadedness when he stood up, hyperglycemic on arrival (CBG 527 per EMS, but reading "high" here), hasn't taken his insulin in 2wks. Requesting a diet coke and his home seroquel and adderall upon arrival. On exam no focal neuro deficits, mildly tachycardic with dry mucous membranes, appears slightly dehydrated. Symptoms likely from hyperglycemia. Will get labs, EKG, trop, Mg/Phos and VBG. Will give fluids and SQ novolog 5U now. Pt also stating he's having "some burning" with urination, likely from spilling glucose into his urine, but will get U/A and GC/CT  testing on U/A.  Will reassess shortly.   12:43 PM Pt getting very upset stating that he wants his seroquel, and wants ativan to sleep. Demanding that he be moved to ICU bed because he doesn't feel we are taking adequate care of him. Demanding a diet coke. I discussed that we don't routinely give ativan to pt's to sleep in the middle of the day, and that we are working him up to see if he has any indications for admission, but that he needs to be patient. He stated that if he got his home seroquel and a diet coke then he would be satisfied. Will order this now. Nursing made aware of his requests, and pt agrees to await the remaining results patiently while we work to get him the care he needs. Additionally, he states he has bad teeth and wants an ibuprofen, which was given to him. Of note, VBG returning with pH 7.34 and bicarb 29.3 with pCO2 54.2, will await remaining labs before calling this DKA since it's a venous blood gas and therefore there is some room for slight variation. Trop neg. CBC w/diff unremarkable. EKG not yet performed, will ask nursing to get this done. U/A, BMP, Mg, and Phos pending. Will reassess shortly.   1:13 PM U/A with few ketones but this is seen  on every U/A he has had done in the past. No evidence of UTI, no bacteria or WBCs, doubt need for empiric GC/CT coverage given this U/A. BMP with Na 129 (falsely low, corrects to 139 with hyperglycemia correction), K 6.2 which is likely falsely high due to the hyperglycemia, gluc 698 without anion gap therefore doubt DKA, bicarb WNL on BMP. Mg and Phos WNL. EKG with some minimal peaked Ts but this is seen on prior EKGs, and overall his EKG is unchanged, therefore doubt this is due to true hyperkalemia. Will give another 1L bolus in order to continue correcting his hyperglycemia, and recheck BMP after this. Discussed case with my attending Dr. Clarene Duke who agrees with plan.   1:30 PM Repeat CBG just now was 485, will give another 5U subQ novolog 70/30, and once 3rd liter is finished will recheck BMP.   2:39 PM Some delays getting the novolog 70/30, will change to regular novolog. 3rd liter finished, will order BMP to be obtained after novolog is given. He is again asking for food and ativan, reiterated that we can't feed him until we've rechecked his blood sugar and rechecked the BMP to ensure his CBG is coming down. He became very upset and started cursing at me, stating we were being incompetent and refusing to feed him, I again reiterated why this was and he said he didn't care and that he would continue to demand food and curse at [place] until we fed him. At that time I stated that we would need to call security if he continued to be verbally abusive, and he screamed that he didn't care and that he was going to continue. I asked nursing staff to call security now to assist with his agitation. Will recheck CBG now despite the fact that we haven't given the 5U novolog yet.   3:30 PM HR improving with fluids. Repeat CBG 341, which is without being given the second dose of novolog. Repeat BMP is still pending, but he is demanding food, and given that his sugars are improving, it's likely that his repeat BMP will  have improvement in his electrolytes, will not wait for results, especially since pt  continues to be disrespectful and disruptive.. Discussed that eating will just increase his sugars, and he needs to be compliant with his regimen of insulin in order to avoid these types of issues. Will give him refill of the last Novolin dose that is listed in the computer (40U BID WC), since there is no record of him being on Levemir, and pt is not reliable with his medical history. Discussed that he needs to f/up with his PCP for ongoing refills and management of his diabetes. Stressed the importance of compliance with meds and the importance of staying hydrated with water. I explained the diagnosis and have given explicit precautions to return to the ER including for any other new or worsening symptoms. The patient understands and accepts the medical plan as it's been dictated and I have answered their questions. Discharge instructions concerning home care and prescriptions have been given. The patient is STABLE and is discharged to home in good condition.  BP 126/80 mmHg  Pulse 115  Temp(Src) 97.9 F (36.6 C) (Oral)  Resp 18  Ht 6\' 1"  (1.854 m)  Wt 63.504 kg  BMI 18.47 kg/m2  SpO2 98%  Meds ordered this encounter  Medications  . sodium chloride 0.9 % bolus 2,000 mL    Sig:   . insulin aspart (novoLOG) injection 5 Units    Sig:   . ibuprofen (ADVIL,MOTRIN) tablet 400 mg    Sig:   . QUEtiapine (SEROQUEL) tablet 200 mg    Sig:   . amphetamine-dextroamphetamine (ADDERALL XR) 30 MG 24 hr capsule    Sig: Take 30 mg by mouth daily.  . QUEtiapine (SEROQUEL) 300 MG tablet    Sig: Take 300 mg by mouth 3 (three) times daily.  . traZODone (DESYREL) 150 MG tablet    Sig: Take 150 mg by mouth at bedtime.  . sodium chloride 0.9 % bolus 1,000 mL    Sig:   . DISCONTD: insulin aspart protamine- aspart (NOVOLOG MIX 70/30) injection 5 Units    Sig:   . insulin aspart (novoLOG) injection 5 Units    Sig:   . insulin  NPH-regular Human (NOVOLIN 70/30) (70-30) 100 UNIT/ML injection    Sig: Inject 40 Units into the skin 2 (two) times daily with a meal.    Dispense:  10 mL    Refill:  0    Order Specific Question:  Supervising Provider    Answer:  02-05-1974 Camprubi-Soms, PA-C 02/26/16 1535  04/27/16, MD 02/27/16 [phone]

## 2016-02-27 LAB — URINE CULTURE: Culture: NO GROWTH

## 2016-02-28 LAB — GC/CHLAMYDIA PROBE AMP (~~LOC~~) NOT AT ARMC
Chlamydia: NEGATIVE
Neisseria Gonorrhea: NEGATIVE

## 2016-03-09 ENCOUNTER — Other Ambulatory Visit: Payer: Self-pay

## 2016-03-09 NOTE — Patient Outreach (Signed)
[facility]) Care Management  03/09/2016  CHIMA ASTORINO 08/10/1986 [id number]   Case closed to [facility] services: unable to contact patient.  No response received back from patient following several outreach calls and unsuccessful outreach letter.  [facility] Care Management Assistant notified. Primary MD notified via case closure letter.   Donato Schultz, RN, BSN, [facility], CCM  [facility] Management Coordinator [phone] Direct [phone] Cell [phone] Office [phone] Fax

## 2016-03-27 ENCOUNTER — Encounter: Payer: Self-pay | Admitting: Internal Medicine

## 2016-04-03 ENCOUNTER — Emergency Department (HOSPITAL_COMMUNITY)
Admission: EM | Admit: 2016-04-03 | Discharge: 2016-04-04 | Disposition: A | Discharge status: Home / Self Care | Payer: PPO | Attending: Emergency Medicine | Admitting: Emergency Medicine

## 2016-04-03 ENCOUNTER — Encounter (HOSPITAL_COMMUNITY): Payer: Self-pay | Admitting: Emergency Medicine

## 2016-04-03 DIAGNOSIS — K0889 Other specified disorders of teeth and supporting structures: Secondary | ICD-10-CM | POA: Diagnosis not present

## 2016-04-03 DIAGNOSIS — F17210 Nicotine dependence, cigarettes, uncomplicated: Secondary | ICD-10-CM | POA: Diagnosis not present

## 2016-04-03 DIAGNOSIS — I10 Essential (primary) hypertension: Secondary | ICD-10-CM | POA: Insufficient documentation

## 2016-04-03 DIAGNOSIS — G8929 Other chronic pain: Secondary | ICD-10-CM | POA: Diagnosis not present

## 2016-04-03 DIAGNOSIS — J45909 Unspecified asthma, uncomplicated: Secondary | ICD-10-CM | POA: Diagnosis not present

## 2016-04-03 DIAGNOSIS — E1165 Type 2 diabetes mellitus with hyperglycemia: Secondary | ICD-10-CM | POA: Insufficient documentation

## 2016-04-03 LAB — CBC WITH DIFFERENTIAL/PLATELET
BASOS PCT: 0 %
Basophils Absolute: 0 10*3/uL (ref 0.0–0.1)
EOS ABS: 0.1 10*3/uL (ref 0.0–0.7)
Eosinophils Relative: 2 %
HCT: 41.1 % (ref 39.0–52.0)
HEMOGLOBIN: 14.5 g/dL (ref 13.0–17.0)
Lymphocytes Relative: 23 %
Lymphs Abs: 1.7 10*3/uL (ref 0.7–4.0)
MCH: 30.6 pg (ref 26.0–34.0)
MCHC: 35.3 g/dL (ref 30.0–36.0)
MCV: 86.7 fL (ref 78.0–100.0)
Monocytes Absolute: 0.4 10*3/uL (ref 0.1–1.0)
Monocytes Relative: 5 %
NEUTROS ABS: 5.4 10*3/uL (ref 1.7–7.7)
NEUTROS PCT: 70 %
Platelets: 249 10*3/uL (ref 150–400)
RBC: 4.74 MIL/uL (ref 4.22–5.81)
RDW: 12.1 % (ref 11.5–15.5)
WBC: 7.6 10*3/uL (ref 4.0–10.5)

## 2016-04-03 LAB — COMPREHENSIVE METABOLIC PANEL
ALBUMIN: 4.8 g/dL (ref 3.5–5.0)
ALK PHOS: 81 U/L (ref 38–126)
ALT: 29 U/L (ref 17–63)
AST: 20 U/L (ref 15–41)
Anion gap: 12 (ref 5–15)
BUN: 20 mg/dL (ref 6–20)
CALCIUM: 9.7 mg/dL (ref 8.9–10.3)
CO2: 26 mmol/L (ref 22–32)
CREATININE: 0.79 mg/dL (ref 0.61–1.24)
Chloride: 95 mmol/L — ABNORMAL LOW (ref 101–111)
GFR calc Af Amer: >60 mL/min (ref >60)
GFR calc non Af Amer: >60 mL/min (ref >60)
GLUCOSE: 567 mg/dL — AB (ref 65–99)
Potassium: 4.2 mmol/L (ref 3.5–5.1)
SODIUM: 133 mmol/L — AB (ref 135–145)
Total Bilirubin: 0.9 mg/dL (ref 0.3–1.2)
Total Protein: 7.2 g/dL (ref 6.5–8.1)

## 2016-04-03 LAB — BLOOD GAS, VENOUS
ACID-BASE EXCESS: 1.1 mmol/L (ref 0.0–2.0)
Bicarbonate: 28 mEq/L — ABNORMAL HIGH (ref 20.0–24.0)
Drawn by: 280161
FIO2: 0.21
O2 Saturation: 23.7 %
PCO2 VEN: 55.5 mmHg — AB (ref 45.0–50.0)
Patient temperature: 98.2
TCO2: 25.6 mmol/L (ref 0–100)
pH, Ven: 7.322 — ABNORMAL HIGH (ref 7.250–7.300)

## 2016-04-03 LAB — CBG MONITORING, ED

## 2016-04-03 MED ORDER — IBUPROFEN 200 MG PO TABS
400.0000 mg | ORAL_TABLET | Freq: Once | ORAL | Status: DC | PRN
  Filled 2016-04-03: qty 2

## 2016-04-03 NOTE — ED Notes (Addendum)
Pt states that he has had upper L sided dental pain today. Pt's blood sugar was also 576 with EMS. Pt is out of insulin. Alert and oriented.

## 2016-04-04 ENCOUNTER — Emergency Department (HOSPITAL_COMMUNITY)
Admission: EM | Admit: 2016-04-04 | Discharge: 2016-04-04 | Disposition: A | Discharge status: Home / Self Care | Payer: PPO | Source: Home / Self Care | Attending: Emergency Medicine | Admitting: Emergency Medicine

## 2016-04-04 ENCOUNTER — Encounter (HOSPITAL_COMMUNITY): Payer: Self-pay | Admitting: Emergency Medicine

## 2016-04-04 DIAGNOSIS — E1065 Type 1 diabetes mellitus with hyperglycemia: Secondary | ICD-10-CM

## 2016-04-04 DIAGNOSIS — E1069 Type 1 diabetes mellitus with other specified complication: Principal | ICD-10-CM

## 2016-04-04 DIAGNOSIS — F17210 Nicotine dependence, cigarettes, uncomplicated: Secondary | ICD-10-CM | POA: Insufficient documentation

## 2016-04-04 DIAGNOSIS — Z79899 Other long term (current) drug therapy: Secondary | ICD-10-CM

## 2016-04-04 DIAGNOSIS — K0889 Other specified disorders of teeth and supporting structures: Secondary | ICD-10-CM

## 2016-04-04 DIAGNOSIS — I10 Essential (primary) hypertension: Secondary | ICD-10-CM | POA: Insufficient documentation

## 2016-04-04 DIAGNOSIS — Z794 Long term (current) use of insulin: Secondary | ICD-10-CM

## 2016-04-04 DIAGNOSIS — E870 Hyperosmolality and hypernatremia: Secondary | ICD-10-CM | POA: Insufficient documentation

## 2016-04-04 LAB — BASIC METABOLIC PANEL
Anion gap: 12 (ref 5–15)
BUN: 18 mg/dL (ref 6–20)
CHLORIDE: 96 mmol/L — AB (ref 101–111)
CO2: 24 mmol/L (ref 22–32)
CREATININE: 0.94 mg/dL (ref 0.61–1.24)
Calcium: 9 mg/dL (ref 8.9–10.3)
GFR calc Af Amer: >60 mL/min (ref >60)
GFR calc non Af Amer: >60 mL/min (ref >60)
Glucose, Bld: 674 mg/dL (ref 65–99)
POTASSIUM: 4.5 mmol/L (ref 3.5–5.1)
Sodium: 132 mmol/L — ABNORMAL LOW (ref 135–145)

## 2016-04-04 LAB — BLOOD GAS, VENOUS
ACID-BASE DEFICIT: 1 mmol/L (ref 0.0–2.0)
BICARBONATE: 25.4 meq/L — AB (ref 20.0–24.0)
O2 Saturation: 50.8 %
PCO2 VEN: 52.3 mmHg — AB (ref 45.0–50.0)
Patient temperature: 98.6
TCO2: 23.6 mmol/L (ref 0–100)
pH, Ven: 7.307 — ABNORMAL HIGH (ref 7.250–7.300)

## 2016-04-04 LAB — CBC
HCT: 38.8 % — ABNORMAL LOW (ref 39.0–52.0)
HEMOGLOBIN: 13.6 g/dL (ref 13.0–17.0)
MCH: 29.8 pg (ref 26.0–34.0)
MCHC: 35.1 g/dL (ref 30.0–36.0)
MCV: 85.1 fL (ref 78.0–100.0)
Platelets: 218 10*3/uL (ref 150–400)
RBC: 4.56 MIL/uL (ref 4.22–5.81)
RDW: 12.1 % (ref 11.5–15.5)
WBC: 5 10*3/uL (ref 4.0–10.5)

## 2016-04-04 LAB — HEPATIC FUNCTION PANEL
ALT: 24 U/L (ref 17–63)
AST: 17 U/L (ref 15–41)
Albumin: 4.4 g/dL (ref 3.5–5.0)
Alkaline Phosphatase: 70 U/L (ref 38–126)
Bilirubin, Direct: <0.1 mg/dL — ABNORMAL LOW (ref 0.1–0.5)
Total Bilirubin: 0.8 mg/dL (ref 0.3–1.2)
Total Protein: 6.9 g/dL (ref 6.5–8.1)

## 2016-04-04 LAB — URINALYSIS, ROUTINE W REFLEX MICROSCOPIC
BILIRUBIN URINE: NEGATIVE
HGB URINE DIPSTICK: NEGATIVE
Ketones, ur: 40 mg/dL — AB
Leukocytes, UA: NEGATIVE
Nitrite: NEGATIVE
PH: 6 (ref 5.0–8.0)
Protein, ur: NEGATIVE mg/dL
SPECIFIC GRAVITY, URINE: 1.038 — AB (ref 1.005–1.030)

## 2016-04-04 LAB — URINE MICROSCOPIC-ADD ON
Bacteria, UA: NONE SEEN
WBC UA: NONE SEEN WBC/hpf (ref 0–5)

## 2016-04-04 LAB — CBG MONITORING, ED
Glucose-Capillary: >600 mg/dL (ref 65–99)
Glucose-Capillary: 363 mg/dL — ABNORMAL HIGH (ref 65–99)

## 2016-04-04 MED ORDER — IBUPROFEN 800 MG PO TABS: 800.0000 mg | ORAL_TABLET | Freq: Three times a day (TID) | ORAL | Status: DC

## 2016-04-04 MED ORDER — PENICILLIN V POTASSIUM 500 MG PO TABS
500.0000 mg | ORAL_TABLET | Freq: Once | ORAL | Status: AC
  Administered 2016-04-04: 500 mg via ORAL
  Filled 2016-04-04: qty 1

## 2016-04-04 MED ORDER — INSULIN ASPART 100 UNIT/ML ~~LOC~~ SOLN
10.0000 [IU] | Freq: Once | SUBCUTANEOUS | Status: AC
  Administered 2016-04-04: 10 [IU] via INTRAVENOUS
  Filled 2016-04-04: qty 1

## 2016-04-04 MED ORDER — SODIUM CHLORIDE 0.9 % IV BOLUS (SEPSIS)
2000.0000 mL | Freq: Once | INTRAVENOUS | Status: AC
  Administered 2016-04-04: 2000 mL via INTRAVENOUS

## 2016-04-04 MED ORDER — OXYCODONE-ACETAMINOPHEN 5-325 MG PO TABS
2.0000 | ORAL_TABLET | Freq: Once | ORAL | Status: AC
Start: 2016-04-04 — End: 2016-04-04
  Administered 2016-04-04: 2 via ORAL
  Filled 2016-04-04: qty 2

## 2016-04-04 MED ORDER — PENICILLIN V POTASSIUM 500 MG PO TABS: 500.0000 mg | ORAL_TABLET | Freq: Three times a day (TID) | ORAL | Status: DC

## 2016-04-04 MED ORDER — OXYCODONE-ACETAMINOPHEN 5-325 MG PO TABS
1.0000 | ORAL_TABLET | Freq: Once | ORAL | Status: AC
  Administered 2016-04-04: 1 via ORAL
  Filled 2016-04-04: qty 1

## 2016-04-04 MED ORDER — ONDANSETRON HCL 4 MG/2ML IJ SOLN
4.0000 mg | Freq: Once | INTRAMUSCULAR | Status: AC
  Administered 2016-04-04: 4 mg via INTRAVENOUS
  Filled 2016-04-04: qty 2

## 2016-04-04 MED ORDER — INSULIN GLARGINE 100 UNIT/ML ~~LOC~~ SOLN: 45.0000 [IU] | Freq: Every day | SUBCUTANEOUS | Status: DC

## 2016-04-04 NOTE — ED Notes (Signed)
Patient given urinal and encouraged to void when able. 

## 2016-04-04 NOTE — ED Provider Notes (Signed)
CSN: [id number]     Arrival date & time 04/04/16  1138 History   First MD Initiated Contact with Patient 04/04/16 1201     Chief Complaint  Patient presents with  . Dental Pain  . Hyperglycemia     (Consider location/radiation/quality/duration/timing/severity/associated sxs/prior Treatment) Patient is a 30 y.o. male presenting with tooth pain.  Dental Pain Location:  Upper Upper teeth location:  15/LU 2nd molar Severity:  Mild Onset quality:  Gradual Duration:  2 days Timing:  Constant Progression:  Worsening Chronicity:  New Context: poor dentition   Context: not abscess   Previous work-up:  Dental exam Relieved by:  None tried Worsened by:  Nothing tried Ineffective treatments:  None tried Associated symptoms: no congestion, no fever, no headaches and no neck pain     Past Medical History  Diagnosis Date  . Depression   . Schizo affective schizophrenia ([name])   . Polysubstance abuse   . Scoliosis   . Chronic pain   . Noncompliance with medication regimen   . GERD (gastroesophageal reflux disease)   . Arthritis     "both hips" (12/21/2014)  . Asthma     Childhood  . Chicken pox   . Diabetes mellitus     Type 1 diabetes  . DKA (diabetic ketoacidoses) ([name]) 11/18/2014  . Hypertension    Past Surgical History  Procedure Laterality Date  . Hand surgery Left     Left thumb surgery   Family History  Problem Relation Age of Onset  . Diabetes Mother    Social History  Substance Use Topics  . Smoking status: Current Every Day Smoker -- 1.00 packs/day for 7 years    Types: Cigarettes  . Smokeless tobacco: Never Used  . Alcohol Use: No     Comment: 12/21/2014 "I drink on a rare occasion"    Review of Systems  Constitutional: Negative for fever, chills and activity change.  HENT: Negative for congestion and rhinorrhea.   Eyes: Negative for visual disturbance.  Respiratory: Negative for cough and shortness of breath.   Cardiovascular: Negative for chest pain.    Gastrointestinal: Negative for vomiting, abdominal pain, diarrhea and constipation.  Endocrine: Negative for polyuria.  Genitourinary: Negative for dysuria and flank pain.  Musculoskeletal: Negative for back pain and neck pain.  Skin: Negative for wound.  Neurological: Negative for headaches.  All other systems reviewed and are negative.     Allergies  Sulfa antibiotics  Home Medications   Prior to Admission medications   Medication Sig Start Date End Date Taking? Authorizing Provider  amphetamine-dextroamphetamine (ADDERALL) 30 MG tablet Take 30 tablets by mouth daily. 03/14/16  Yes Historical Provider, MD  meloxicam (MOBIC) 15 MG tablet Take 15 mg by mouth daily as needed for pain.  03/14/16  Yes Historical Provider, MD  QUEtiapine (SEROQUEL) 300 MG tablet Take 300 mg by mouth 3 (three) times daily.   Yes Historical Provider, MD  traZODone (DESYREL) 150 MG tablet Take 150 mg by mouth at bedtime.   Yes Historical Provider, MD  ibuprofen (ADVIL,MOTRIN) 800 MG tablet Take 1 tablet (800 mg total) by mouth 3 (three) times daily. 04/04/16   Marily Memos, MD  insulin glargine (LANTUS) 100 UNIT/ML injection Inject 0.45 mLs (45 Units total) into the skin at bedtime. 04/04/16   Marily Memos, MD  penicillin v potassium (VEETID) 500 MG tablet Take 1 tablet (500 mg total) by mouth 3 (three) times daily. 04/04/16   Marily Memos, MD   BP 122/87 mmHg  Pulse 98  Temp(Src) 98 F (36.7 C) (Oral)  Resp 18  SpO2 100% Physical Exam  Constitutional: He is oriented to person, place, and time. He appears well-developed and well-nourished.  HENT:  Head: Normocephalic and atraumatic.  Mouth/Throat: Mucous membranes are dry. Dental caries present.    Neck: Normal range of motion.  Cardiovascular: Tachycardia present.   Pulmonary/Chest: Effort normal. No respiratory distress. He has no wheezes. He has no rales.  Abdominal: Soft. He exhibits no distension. There is no tenderness. There is no rebound.   Musculoskeletal: Normal range of motion.  Neurological: He is alert and oriented to person, place, and time. No cranial nerve deficit. Coordination normal.  Skin: Skin is warm. No erythema.  Nursing note and vitals reviewed.   ED Course  Procedures (including critical care time) Labs Review Labs Reviewed  BASIC METABOLIC PANEL - Abnormal; Notable for the following:    Sodium 132 (*)    Chloride 96 (*)    Glucose, Bld 674 (*)    All other components within normal limits  CBC - Abnormal; Notable for the following:    HCT 38.8 (*)    All other components within normal limits  URINALYSIS, ROUTINE W REFLEX MICROSCOPIC (NOT AT [facility]) - Abnormal; Notable for the following:    Specific Gravity, Urine 1.038 (*)    Glucose, UA >1000 (*)    Ketones, ur 40 (*)    All other components within normal limits  BLOOD GAS, VENOUS - Abnormal; Notable for the following:    pH, Ven 7.307 (*)    pCO2, Ven 52.3 (*)    Bicarbonate 25.4 (*)    All other components within normal limits  HEPATIC FUNCTION PANEL - Abnormal; Notable for the following:    Bilirubin, Direct <0.1 (*)    All other components within normal limits  URINE MICROSCOPIC-ADD ON - Abnormal; Notable for the following:    Squamous Epithelial / LPF 0-5 (*)    All other components within normal limits  CBG MONITORING, ED - Abnormal; Notable for the following:    Glucose-Capillary >600 (*)    All other components within normal limits  CBG MONITORING, ED - Abnormal; Notable for the following:    Glucose-Capillary 363 (*)    All other components within normal limits  CBG MONITORING, ED    Imaging Review No results found. I have personally reviewed and evaluated these images and lab results as part of my medical decision-making.   EKG Interpretation None      MDM   Final diagnoses:  Uncontrolled type 1 diabetes mellitus with hyperosmolarity without coma ([name])    Hyperglycemia likely 2/2 noncompliance and dental infection.  Pain and glucose improved with insulin, fluids and antibiotics. Discussed with Dr. Concepcion Elk and will restart lantus for his diabetes as he can't afford the other prescription. Will follow up with dental clinic as well.   New Prescriptions: Discharge Medication List as of 04/04/2016  3:10 PM    START taking these medications   Details  insulin glargine (LANTUS) 100 UNIT/ML injection Inject 0.45 mLs (45 Units total) into the skin at bedtime., Starting 04/04/2016, Until Discontinued, Print       I have personally and contemperaneously reviewed labs and imaging and used in my decision making as above.   A medical screening exam was performed and I feel the patient has had an appropriate workup for their chief complaint at this time and likelihood of emergent condition existing is low. Their vital signs are  stable. They have been counseled on decision, discharge, follow up and which symptoms necessitate immediate return to the emergency department.  They verbally stated understanding and agreement with plan and discharged in stable condition.      Marily Memos, MD 04/04/16 [phone]

## 2016-04-04 NOTE — ED Notes (Signed)
Discharge instructions, follow up care, and rx x3 reviewed with patient. Patient verbalized understanding. 

## 2016-04-04 NOTE — ED Notes (Signed)
Bed: GB15 Expected date:  Expected time:  Means of arrival:  Comments: EMS- 29yo M, dental painhyperglycemia

## 2016-04-04 NOTE — ED Notes (Signed)
MD at bedside. 

## 2016-04-04 NOTE — ED Notes (Signed)
Patient here with complaints of L upper side dental pain 10/10. Blood sugar over 600. AAO x4. Hx of same.

## 2016-04-04 NOTE — Discharge Instructions (Signed)
Community Resource Guide Dental °The [employer]’s “211” is a great source of information about community services available.  Access by dialing 2-1-1 from anywhere in [place], or by website -  www.nc211.org.  ° °Other Local Resources (Updated 12/2015) ° °Dental  Care °  °Services ° °  °Phone Number and Address  °Cost  °[facility] For children 0 - 32 years of age:  °• Cleaning °• Tooth brushing/flossing instruction °• Sealants, fillings, crowns °• Extractions °• Emergency treatment  [phone] °[address] °[place], [place] [address] Charges based on family income.  Medicaid and some insurance plans accepted.   °  °[facility] [place] • Cleaning °• Sealants, fillings, crowns °• Extractions °• Emergency treatment [phone] °[address] °[place], [place] ° Pregnant women 18 years of age or older with a Medicaid card  °[facility] [place] • Cleaning °• Sealants, fillings, crowns °• Extractions °• Emergency treatment [phone] °[address] °[place], [place] Pregnant women 18 years of age or older with a Medicaid card  °[facility] For children 0 - 32 years of age:  °• Cleaning °• Tooth brushing/flossing instruction °• Sealants, fillings, crowns °• Extractions °• Emergency treatment °Limited orthodontic services for patients with Medicaid [phone] °[address] °[place], [place] [address] Medicaid and [place] Health Choice cover for children up to age 32 and pregnant women.  Parents of children up to age 32 without Medicaid pay a reduced fee at time of service.  °[facility] [place] For children 0 - 32 years of age:  °• Cleaning °• Tooth brushing/flossing instruction °• Sealants, fillings, crowns °• Extractions °• Emergency treatment °Limited orthodontic services for patients with Medicaid  [phone] °[address] °[place], [place].  Medicaid and [place] Health Choice cover for children up to age 32 and pregnant women.  Parents of children up to age 32 without Medicaid pay a reduced fee.  °[facility] • Cleaning °• Sealants, fillings, crowns °• Extractions ° °Hours: Tuesdays and Thursdays, 4:15 - 8 pm [phone] °[address] °[place], [place] [address] Services free of charge to  County residents ages 18-64 who do not have health insurance, Medicare, Medicaid, or VA benefits and fall within federal poverty guidelines  °[facility] ° ° ° Provides dental care in addition to primary medical care, nutritional counseling, and pharmacy: °• Cleaning °• Sealants, fillings, crowns °• Extractions ° ° ° ° ° ° ° ° ° ° ° ° ° ° ° ° ° [phone] °[facility], [address] °[place], [place] ° °[phone] °[facility], [address] [place], [place] ° °[phone] °[facility] °[place], [place] ° °[phone] °[facility], [address] °[place], [place] ° °[phone] °[facility] °[address] °[place], [place] Accepts Medicaid, Medicare, most insurance.  Also provides services available to all with fees adjusted based on ability to pay.    °[facility] • Cleaning °• Tooth brushing/flossing instruction °• Sealants, fillings, crowns °• Extractions °• Emergency treatment °Hours: Tuesdays, Thursdays, and Fridays from 8 am to 5 pm by appointment only. [phone] °[address] °[place], [place] [address] [place] residents with Medicaid (depending on eligibility) and children with  Health Choice - call for more information.  °  [facility] • Extractions only ° °Hours: 2nd and 4th Thursday of each month from 6:30 am - 9 am.   [phone] ext. [phone] °[address] °[place], [place] [address] Ages 18 and older only.  Patients are seen on a first come, first served basis.  °[facility] • Cleanings °• Fillings °• Extractions °• Orthodontics °• Endodontics °• Implants/Crowns/Bridges °• Complete and partial dentures [phone] °[place], [place] Patients must complete an application for services.  There is often a waiting list.   ° °

## 2016-04-06 MED FILL — PENICILLIN VK 500 MG TABLET: 500 | 7 days supply | Qty: 21 | Fill #0

## 2016-04-06 MED FILL — IBUPROFEN 800 MG TABLET: 800 | 7 days supply | Qty: 21 | Fill #0

## 2016-04-06 MED FILL — LANTUS 100 UNITS/ML VIAL: 100 | 22 days supply | Qty: 10 | Fill #0

## 2016-05-04 ENCOUNTER — Emergency Department (HOSPITAL_COMMUNITY)
Admission: EM | Admit: 2016-05-04 | Discharge: 2016-05-04 | Disposition: A | Discharge status: Home / Self Care | Payer: PPO | Attending: Emergency Medicine | Admitting: Emergency Medicine

## 2016-05-04 ENCOUNTER — Encounter (HOSPITAL_COMMUNITY): Payer: Self-pay | Admitting: Emergency Medicine

## 2016-05-04 DIAGNOSIS — Z792 Long term (current) use of antibiotics: Secondary | ICD-10-CM | POA: Diagnosis not present

## 2016-05-04 DIAGNOSIS — R112 Nausea with vomiting, unspecified: Secondary | ICD-10-CM | POA: Diagnosis not present

## 2016-05-04 DIAGNOSIS — J45909 Unspecified asthma, uncomplicated: Secondary | ICD-10-CM | POA: Diagnosis not present

## 2016-05-04 DIAGNOSIS — M160 Bilateral primary osteoarthritis of hip: Secondary | ICD-10-CM | POA: Diagnosis not present

## 2016-05-04 DIAGNOSIS — R197 Diarrhea, unspecified: Secondary | ICD-10-CM | POA: Insufficient documentation

## 2016-05-04 DIAGNOSIS — K0889 Other specified disorders of teeth and supporting structures: Secondary | ICD-10-CM | POA: Diagnosis not present

## 2016-05-04 DIAGNOSIS — I10 Essential (primary) hypertension: Secondary | ICD-10-CM | POA: Insufficient documentation

## 2016-05-04 DIAGNOSIS — R739 Hyperglycemia, unspecified: Secondary | ICD-10-CM

## 2016-05-04 DIAGNOSIS — E1065 Type 1 diabetes mellitus with hyperglycemia: Secondary | ICD-10-CM | POA: Insufficient documentation

## 2016-05-04 DIAGNOSIS — F17210 Nicotine dependence, cigarettes, uncomplicated: Secondary | ICD-10-CM | POA: Diagnosis not present

## 2016-05-04 LAB — BLOOD GAS, VENOUS
ACID-BASE EXCESS: 0.9 mmol/L (ref 0.0–2.0)
BICARBONATE: 26.2 meq/L — AB (ref 20.0–24.0)
Drawn by: 103701
O2 Saturation: 88.4 %
PCO2 VEN: 47.1 mmHg (ref 45.0–50.0)
PO2 VEN: 57.8 mmHg — AB (ref 31.0–45.0)
Patient temperature: 98.6
TCO2: 23.7 mmol/L (ref 0–100)
pH, Ven: 7.365 — ABNORMAL HIGH (ref 7.250–7.300)

## 2016-05-04 LAB — BASIC METABOLIC PANEL
Anion gap: 10 (ref 5–15)
BUN: 19 mg/dL (ref 6–20)
CALCIUM: 9.2 mg/dL (ref 8.9–10.3)
CO2: 26 mmol/L (ref 22–32)
Chloride: 92 mmol/L — ABNORMAL LOW (ref 101–111)
Creatinine, Ser: 0.86 mg/dL (ref 0.61–1.24)
GFR calc Af Amer: >60 mL/min (ref >60)
GLUCOSE: 804 mg/dL — AB (ref 65–99)
Potassium: 4.4 mmol/L (ref 3.5–5.1)
Sodium: 128 mmol/L — ABNORMAL LOW (ref 135–145)

## 2016-05-04 LAB — URINALYSIS, ROUTINE W REFLEX MICROSCOPIC
BILIRUBIN URINE: NEGATIVE
Glucose, UA: >1000 mg/dL — AB
HGB URINE DIPSTICK: NEGATIVE
KETONES UR: NEGATIVE mg/dL
Leukocytes, UA: NEGATIVE
NITRITE: NEGATIVE
PH: 6.5 (ref 5.0–8.0)
Protein, ur: NEGATIVE mg/dL
Specific Gravity, Urine: 1.031 — ABNORMAL HIGH (ref 1.005–1.030)

## 2016-05-04 LAB — CBC
HCT: 40 % (ref 39.0–52.0)
Hemoglobin: 13.9 g/dL (ref 13.0–17.0)
MCH: 30.2 pg (ref 26.0–34.0)
MCHC: 34.8 g/dL (ref 30.0–36.0)
MCV: 86.8 fL (ref 78.0–100.0)
Platelets: 230 10*3/uL (ref 150–400)
RBC: 4.61 MIL/uL (ref 4.22–5.81)
RDW: 12.1 % (ref 11.5–15.5)
WBC: 9 10*3/uL (ref 4.0–10.5)

## 2016-05-04 LAB — HEPATIC FUNCTION PANEL
ALK PHOS: 80 U/L (ref 38–126)
ALT: 21 U/L (ref 17–63)
AST: 19 U/L (ref 15–41)
Albumin: 4.4 g/dL (ref 3.5–5.0)
BILIRUBIN INDIRECT: 0.6 mg/dL (ref 0.3–0.9)
Bilirubin, Direct: 0.1 mg/dL (ref 0.1–0.5)
TOTAL PROTEIN: 6.5 g/dL (ref 6.5–8.1)
Total Bilirubin: 0.7 mg/dL (ref 0.3–1.2)

## 2016-05-04 LAB — CBG MONITORING, ED: GLUCOSE-CAPILLARY: 314 mg/dL — AB (ref 65–99)

## 2016-05-04 LAB — URINE MICROSCOPIC-ADD ON
BACTERIA UA: NONE SEEN
RBC / HPF: NONE SEEN RBC/hpf (ref 0–5)
Squamous Epithelial / LPF: NONE SEEN
WBC, UA: NONE SEEN WBC/hpf (ref 0–5)

## 2016-05-04 LAB — I-STAT CG4 LACTIC ACID, ED: Lactic Acid, Venous: 2.16 mmol/L (ref 0.5–2.0)

## 2016-05-04 MED ORDER — PANTOPRAZOLE SODIUM 20 MG PO TBEC: 20.0000 mg | DELAYED_RELEASE_TABLET | Freq: Two times a day (BID) | ORAL | Status: DC

## 2016-05-04 MED ORDER — PANTOPRAZOLE SODIUM 40 MG PO TBEC
40.0000 mg | DELAYED_RELEASE_TABLET | Freq: Once | ORAL | Status: AC
  Administered 2016-05-04: 40 mg via ORAL
  Filled 2016-05-04: qty 1

## 2016-05-04 MED ORDER — BLOOD GLUCOSE MONITOR KIT: PACK | Status: DC

## 2016-05-04 MED ORDER — INSULIN ASPART 100 UNIT/ML ~~LOC~~ SOLN
12.0000 [IU] | Freq: Once | SUBCUTANEOUS | Status: AC
  Administered 2016-05-04: 12 [IU] via INTRAVENOUS
  Filled 2016-05-04: qty 1

## 2016-05-04 MED ORDER — SODIUM CHLORIDE 0.9 % IV BOLUS (SEPSIS)
1000.0000 mL | Freq: Once | INTRAVENOUS | Status: AC
  Administered 2016-05-04: 1000 mL via INTRAVENOUS

## 2016-05-04 MED ORDER — ONDANSETRON HCL 4 MG/2ML IJ SOLN
4.0000 mg | Freq: Once | INTRAMUSCULAR | Status: AC
  Administered 2016-05-04: 4 mg via INTRAVENOUS
  Filled 2016-05-04: qty 2

## 2016-05-04 MED ORDER — AMOXICILLIN-POT CLAVULANATE 875-125 MG PO TABS: 1.0000 | ORAL_TABLET | Freq: Two times a day (BID) | ORAL | Status: AC

## 2016-05-04 MED ORDER — ONDANSETRON 4 MG PO TBDP: 4.0000 mg | ORAL_TABLET | Freq: Three times a day (TID) | ORAL | Status: DC | PRN

## 2016-05-04 MED ORDER — LIDOCAINE VISCOUS 2 % MT SOLN
15.0000 mL | Freq: Once | OROMUCOSAL | Status: AC
  Administered 2016-05-04: 15 mL via OROMUCOSAL
  Filled 2016-05-04: qty 15

## 2016-05-04 NOTE — ED Notes (Signed)
Bed: JK09 Expected date:  Expected time:  Means of arrival:  Comments: EMS- 30s, hyperglycemia/cbg>600/n/v

## 2016-05-04 NOTE — ED Provider Notes (Signed)
CSN: [id number]     Arrival date & time 05/04/16  1600 History   First MD Initiated Contact with Patient 05/04/16 1603     Chief Complaint  Patient presents with  . Hyperglycemia     (Consider location/radiation/quality/duration/timing/severity/associated sxs/prior Treatment) Patient is a 30 y.o. male presenting with hyperglycemia.  Hyperglycemia Severity:  Severe Duration:  1 day (although pt unsure because does not have glucometer) Timing:  Constant Progression:  Unchanged Chronicity:  New Diabetes status:  Controlled with insulin (however poor control) Context: recent illness (n/v/diarrhea)   Context: not change in medication and not new diabetes diagnosis   Associated symptoms: nausea and vomiting   Associated symptoms: no abdominal pain, no chest pain, no dysuria, no fever and no shortness of breath   Risk factors: hx of DKA     Past Medical History  Diagnosis Date  . Depression   . Schizo affective schizophrenia ([name])   . Polysubstance abuse   . Scoliosis   . Chronic pain   . Noncompliance with medication regimen   . GERD (gastroesophageal reflux disease)   . Arthritis     "both hips" (12/21/2014)  . Asthma     Childhood  . Chicken pox   . Diabetes mellitus     Type 1 diabetes  . DKA (diabetic ketoacidoses) ([name]) 11/18/2014  . Hypertension    Past Surgical History  Procedure Laterality Date  . Hand surgery Left     Left thumb surgery   Family History  Problem Relation Age of Onset  . Diabetes Mother    Social History  Substance Use Topics  . Smoking status: Current Every Day Smoker -- 1.00 packs/day for 7 years    Types: Cigarettes  . Smokeless tobacco: Never Used  . Alcohol Use: No     Comment: 12/21/2014 "I drink on a rare occasion"    Review of Systems  Constitutional: Negative for fever.  HENT: Positive for dental problem. Negative for sore throat.   Eyes: Negative for visual disturbance.  Respiratory: Negative for shortness of breath.    Cardiovascular: Negative for chest pain.  Gastrointestinal: Positive for nausea, vomiting and diarrhea. Negative for abdominal pain.  Genitourinary: Negative for dysuria and difficulty urinating.  Musculoskeletal: Negative for back pain and neck stiffness.  Skin: Negative for rash.  Neurological: Negative for syncope and headaches.      Allergies  Sulfa antibiotics  Home Medications   Prior to Admission medications   Medication Sig Start Date End Date Taking? Authorizing Provider  amphetamine-dextroamphetamine (ADDERALL) 30 MG tablet Take 30 tablets by mouth daily. 03/14/16  Yes Historical Provider, MD  ibuprofen (ADVIL,MOTRIN) 800 MG tablet Take 1 tablet (800 mg total) by mouth 3 (three) times daily. 04/04/16  Yes Marily Memos, MD  insulin glargine (LANTUS) 100 UNIT/ML injection Inject 0.45 mLs (45 Units total) into the skin at bedtime. 04/04/16  Yes Marily Memos, MD  insulin lispro (HUMALOG) 100 UNIT/ML injection Inject 10 Units into the skin 3 (three) times daily before meals.   Yes Historical Provider, MD  QUEtiapine (SEROQUEL) 300 MG tablet Take 900 mg by mouth at bedtime.    Yes Historical Provider, MD  traZODone (DESYREL) 150 MG tablet Take 150 mg by mouth at bedtime.   Yes Historical Provider, MD  amoxicillin-clavulanate (AUGMENTIN) 875-125 MG tablet Take 1 tablet by mouth every 12 (twelve) hours. 05/04/16 05/11/16  Alvira Monday, MD  meloxicam (MOBIC) 15 MG tablet Take 15 mg by mouth daily as needed for pain.  03/14/16  Historical Provider, MD  penicillin v potassium (VEETID) 500 MG tablet Take 1 tablet (500 mg total) by mouth 3 (three) times daily. Patient not taking: Reported on 05/04/2016 04/04/16   Marily Memos, MD   BP 105/77 mmHg  Pulse 104  Temp(Src) 98.4 F (36.9 C) (Oral)  Resp 13  SpO2 98% Physical Exam  Constitutional: He is oriented to person, place, and time. He appears well-developed. No distress.  HENT:  Head: Normocephalic and atraumatic.  Poor  dentition Tenderness over right lower molars, no abscess, no sublingual swelling  Eyes: Conjunctivae and EOM are normal.  Neck: Normal range of motion.  Cardiovascular: Normal rate, regular rhythm, normal heart sounds and intact distal pulses.  Exam reveals no gallop and no friction rub.   No murmur heard. Pulmonary/Chest: Effort normal and breath sounds normal. No respiratory distress. He has no wheezes. He has no rales.  Abdominal: Soft. He exhibits no distension. There is no tenderness. There is no guarding.  Musculoskeletal: He exhibits no edema.  Neurological: He is alert and oriented to person, place, and time.  Skin: Skin is warm and dry. He is not diaphoretic.  Nursing note and vitals reviewed.   ED Course  Procedures (including critical care time) Labs Review Labs Reviewed  BASIC METABOLIC PANEL - Abnormal; Notable for the following:    Sodium 128 (*)    Chloride 92 (*)    Glucose, Bld 804 (*)    All other components within normal limits  URINALYSIS, ROUTINE W REFLEX MICROSCOPIC (NOT AT [facility]) - Abnormal; Notable for the following:    Specific Gravity, Urine 1.031 (*)    Glucose, UA >1000 (*)    All other components within normal limits  BLOOD GAS, VENOUS - Abnormal; Notable for the following:    pH, Ven 7.365 (*)    pO2, Ven 57.8 (*)    Bicarbonate 26.2 (*)    All other components within normal limits  CBG MONITORING, ED - Abnormal; Notable for the following:    Glucose-Capillary >600 (*)    All other components within normal limits  I-STAT CG4 LACTIC ACID, ED - Abnormal; Notable for the following:    Lactic Acid, Venous 2.16 (*)    All other components within normal limits  CBC  HEPATIC FUNCTION PANEL  URINE MICROSCOPIC-ADD ON  I-STAT CG4 LACTIC ACID, ED    Imaging Review No results found. I have personally reviewed and evaluated these images and lab results as part of my medical decision-making.   EKG Interpretation None      MDM   Final diagnoses:   Hyperglycemia  Nausea vomiting and diarrhea  Pain, dental   30 year old male with a history of diabetes presents with concern for nausea, vomiting and diarrhea the evening today, right lower dental pain beginning today, and hyperglycemia. Patient with signs of hyperglycemia on lab analysis, however no signs of DKA. His mental status is normal. His abdominal exam is benign, and we'll suspicion for acute intra-abdominal pathology. He has been on antibiotics recently for a left upper dental infection, however describes only 2 episodes of diarrhea, and overall low suspicion for C. difficile. Suspect gastroenteritis. Patient was given 2 L of normal saline, and insulin for his hyperglycemia. Currently awaiting continued recheck and improvement of glucose prior to discharge. Given patient's dental pain, ordered Augmentin for 1 week. There is no sign of drainable abscess on exam. Recommend close PCP follow-up regarding glucose management, as well as follow-up with dentist.  Alvira Monday, MD 05/04/16 1905

## 2016-05-04 NOTE — ED Notes (Signed)
EDP made aware of patient lactic result. 

## 2016-05-04 NOTE — ED Notes (Signed)
Patient presents via EMS for hyperglycemia, emesis x4 and diarrhea. 18g left forearm 450cc NS in route.   Last VS: 127/83, 104hr, 16resp, cbg "high"

## 2016-05-30 ENCOUNTER — Observation Stay (HOSPITAL_COMMUNITY): Admission: AD | Admit: 2016-05-30 | Payer: PPO | Source: Intra-hospital | Admitting: Psychiatry

## 2016-05-30 ENCOUNTER — Emergency Department (HOSPITAL_COMMUNITY)
Admission: EM | Admit: 2016-05-30 | Discharge: 2016-05-31 | Disposition: A | Discharge status: Home / Self Care | Payer: PPO | Attending: Emergency Medicine | Admitting: Emergency Medicine

## 2016-05-30 ENCOUNTER — Encounter (HOSPITAL_COMMUNITY): Payer: Self-pay

## 2016-05-30 DIAGNOSIS — I10 Essential (primary) hypertension: Secondary | ICD-10-CM | POA: Insufficient documentation

## 2016-05-30 DIAGNOSIS — F1410 Cocaine abuse, uncomplicated: Secondary | ICD-10-CM | POA: Insufficient documentation

## 2016-05-30 DIAGNOSIS — F1994 Other psychoactive substance use, unspecified with psychoactive substance-induced mood disorder: Secondary | ICD-10-CM | POA: Diagnosis not present

## 2016-05-30 DIAGNOSIS — M160 Bilateral primary osteoarthritis of hip: Secondary | ICD-10-CM | POA: Insufficient documentation

## 2016-05-30 DIAGNOSIS — F201 Disorganized schizophrenia: Secondary | ICD-10-CM | POA: Insufficient documentation

## 2016-05-30 DIAGNOSIS — Z79899 Other long term (current) drug therapy: Secondary | ICD-10-CM | POA: Diagnosis not present

## 2016-05-30 DIAGNOSIS — J45909 Unspecified asthma, uncomplicated: Secondary | ICD-10-CM | POA: Insufficient documentation

## 2016-05-30 DIAGNOSIS — E119 Type 2 diabetes mellitus without complications: Secondary | ICD-10-CM | POA: Diagnosis not present

## 2016-05-30 DIAGNOSIS — Z765 Malingerer [conscious simulation]: Secondary | ICD-10-CM | POA: Insufficient documentation

## 2016-05-30 DIAGNOSIS — F1510 Other stimulant abuse, uncomplicated: Secondary | ICD-10-CM | POA: Insufficient documentation

## 2016-05-30 DIAGNOSIS — F1210 Cannabis abuse, uncomplicated: Secondary | ICD-10-CM | POA: Insufficient documentation

## 2016-05-30 DIAGNOSIS — F909 Attention-deficit hyperactivity disorder, unspecified type: Secondary | ICD-10-CM | POA: Diagnosis present

## 2016-05-30 DIAGNOSIS — F1910 Other psychoactive substance abuse, uncomplicated: Secondary | ICD-10-CM | POA: Diagnosis present

## 2016-05-30 DIAGNOSIS — F17210 Nicotine dependence, cigarettes, uncomplicated: Secondary | ICD-10-CM | POA: Insufficient documentation

## 2016-05-30 DIAGNOSIS — L0291 Cutaneous abscess, unspecified: Secondary | ICD-10-CM

## 2016-05-30 DIAGNOSIS — Z9114 Patient's other noncompliance with medication regimen: Secondary | ICD-10-CM

## 2016-05-30 DIAGNOSIS — E1165 Type 2 diabetes mellitus with hyperglycemia: Secondary | ICD-10-CM

## 2016-05-30 DIAGNOSIS — Z794 Long term (current) use of insulin: Secondary | ICD-10-CM | POA: Insufficient documentation

## 2016-05-30 LAB — CBC
HEMATOCRIT: 42.7 % (ref 39.0–52.0)
HEMOGLOBIN: 15.6 g/dL (ref 13.0–17.0)
MCH: 30.6 pg (ref 26.0–34.0)
MCHC: 36.5 g/dL — AB (ref 30.0–36.0)
MCV: 83.7 fL (ref 78.0–100.0)
Platelets: 257 10*3/uL (ref 150–400)
RBC: 5.1 MIL/uL (ref 4.22–5.81)
RDW: 12.3 % (ref 11.5–15.5)
WBC: 6.9 10*3/uL (ref 4.0–10.5)

## 2016-05-30 LAB — COMPREHENSIVE METABOLIC PANEL
ALBUMIN: 3.8 g/dL (ref 3.5–5.0)
ALT: 10 U/L — ABNORMAL LOW (ref 17–63)
ANION GAP: 15 (ref 5–15)
AST: 11 U/L — AB (ref 15–41)
Alkaline Phosphatase: 75 U/L (ref 38–126)
BILIRUBIN TOTAL: 1.5 mg/dL — AB (ref 0.3–1.2)
BUN: 10 mg/dL (ref 6–20)
CO2: 23 mmol/L (ref 22–32)
Calcium: 8.9 mg/dL (ref 8.9–10.3)
Chloride: 96 mmol/L — ABNORMAL LOW (ref 101–111)
Creatinine, Ser: 0.72 mg/dL (ref 0.61–1.24)
GFR calc Af Amer: >60 mL/min (ref >60)
GFR calc non Af Amer: >60 mL/min (ref >60)
GLUCOSE: 406 mg/dL — AB (ref 65–99)
POTASSIUM: 3.9 mmol/L (ref 3.5–5.1)
SODIUM: 134 mmol/L — AB (ref 135–145)
TOTAL PROTEIN: 6.4 g/dL — AB (ref 6.5–8.1)

## 2016-05-30 LAB — RAPID URINE DRUG SCREEN, HOSP PERFORMED
AMPHETAMINES: NOT DETECTED
BARBITURATES: NOT DETECTED
Benzodiazepines: NOT DETECTED
Cocaine: NOT DETECTED
OPIATES: NOT DETECTED
TETRAHYDROCANNABINOL: NOT DETECTED

## 2016-05-30 LAB — CBG MONITORING, ED
GLUCOSE-CAPILLARY: 303 mg/dL — AB (ref 65–99)
GLUCOSE-CAPILLARY: 440 mg/dL — AB (ref 65–99)
Glucose-Capillary: 283 mg/dL — ABNORMAL HIGH (ref 65–99)
Glucose-Capillary: 342 mg/dL — ABNORMAL HIGH (ref 65–99)
Glucose-Capillary: 297 mg/dL — ABNORMAL HIGH (ref 65–99)
Glucose-Capillary: 203 mg/dL — ABNORMAL HIGH (ref 65–99)

## 2016-05-30 LAB — ETHANOL: Alcohol, Ethyl (B): <5 mg/dL (ref <5)

## 2016-05-30 MED ORDER — LORAZEPAM 0.5 MG PO TABS
0.5000 mg | ORAL_TABLET | Freq: Once | ORAL | Status: AC
  Administered 2016-05-30: 0.5 mg via ORAL
  Filled 2016-05-30: qty 1

## 2016-05-30 MED ORDER — ACETAMINOPHEN 325 MG PO TABS
650.0000 mg | ORAL_TABLET | ORAL | Status: DC | PRN
  Administered 2016-05-31: 650 mg via ORAL
  Filled 2016-05-30: qty 2

## 2016-05-30 MED ORDER — DOXYCYCLINE HYCLATE 100 MG PO TABS
100.0000 mg | ORAL_TABLET | Freq: Two times a day (BID) | ORAL | Status: DC
  Administered 2016-05-30 (×2): 100 mg via ORAL
  Filled 2016-05-30 (×2): qty 1

## 2016-05-30 MED ORDER — VANCOMYCIN HCL IN DEXTROSE 1-5 GM/200ML-% IV SOLN
1000.0000 mg | Freq: Once | INTRAVENOUS | Status: AC
  Administered 2016-05-30: 1000 mg via INTRAVENOUS
  Filled 2016-05-30: qty 200

## 2016-05-30 MED ORDER — HYDROXYZINE HCL 25 MG PO TABS: 25.0000 mg | ORAL_TABLET | Freq: Four times a day (QID) | ORAL | Status: DC | PRN

## 2016-05-30 MED ORDER — SODIUM CHLORIDE 0.9 % IV BOLUS (SEPSIS)
2000.0000 mL | Freq: Once | INTRAVENOUS | Status: AC
  Administered 2016-05-30: 2000 mL via INTRAVENOUS

## 2016-05-30 MED ORDER — INSULIN ASPART 100 UNIT/ML ~~LOC~~ SOLN
2.0000 [IU] | Freq: Once | SUBCUTANEOUS | Status: AC
  Administered 2016-05-30: 2 [IU] via SUBCUTANEOUS
  Filled 2016-05-30: qty 1

## 2016-05-30 MED ORDER — CEPHALEXIN 500 MG PO CAPS
500.0000 mg | ORAL_CAPSULE | Freq: Four times a day (QID) | ORAL | Status: DC
  Administered 2016-05-30 – 2016-05-31 (×5): 500 mg via ORAL
  Filled 2016-05-30 (×5): qty 1

## 2016-05-30 MED ORDER — ONDANSETRON HCL 4 MG PO TABS: 4.0000 mg | ORAL_TABLET | Freq: Three times a day (TID) | ORAL | Status: DC | PRN

## 2016-05-30 MED ORDER — PIPERACILLIN-TAZOBACTAM 3.375 G IVPB 30 MIN
3.3750 g | Freq: Once | INTRAVENOUS | Status: AC
  Administered 2016-05-30: 3.375 g via INTRAVENOUS
  Filled 2016-05-30: qty 50

## 2016-05-30 MED ORDER — INSULIN ASPART 100 UNIT/ML ~~LOC~~ SOLN
0.0000 [IU] | Freq: Three times a day (TID) | SUBCUTANEOUS | Status: DC
  Administered 2016-05-30: 7 [IU] via SUBCUTANEOUS
  Administered 2016-05-30: 3 [IU] via SUBCUTANEOUS
  Administered 2016-05-30: 08:00:00 via SUBCUTANEOUS
  Filled 2016-05-30 (×4): qty 1

## 2016-05-30 MED ORDER — INSULIN ASPART 100 UNIT/ML ~~LOC~~ SOLN
5.0000 [IU] | Freq: Once | SUBCUTANEOUS | Status: AC
  Administered 2016-05-30: 5 [IU] via SUBCUTANEOUS
  Filled 2016-05-30: qty 1

## 2016-05-30 MED ORDER — INSULIN ASPART 100 UNIT/ML ~~LOC~~ SOLN
4.0000 [IU] | Freq: Once | SUBCUTANEOUS | Status: AC
  Administered 2016-05-30: 4 [IU] via INTRAVENOUS
  Filled 2016-05-30: qty 1

## 2016-05-30 MED ORDER — TETANUS-DIPHTH-ACELL PERTUSSIS 5-2.5-18.5 LF-MCG/0.5 IM SUSP
0.5000 mL | Freq: Once | INTRAMUSCULAR | Status: AC
  Administered 2016-05-30: 0.5 mL via INTRAMUSCULAR
  Filled 2016-05-30: qty 0.5

## 2016-05-30 MED ORDER — LORAZEPAM 1 MG PO TABS
1.0000 mg | ORAL_TABLET | Freq: Three times a day (TID) | ORAL | Status: DC | PRN
  Administered 2016-05-30: 1 mg via ORAL
  Filled 2016-05-30: qty 1

## 2016-05-30 MED ORDER — TRAZODONE HCL 50 MG PO TABS
50.0000 mg | ORAL_TABLET | Freq: Every evening | ORAL | Status: DC | PRN
  Administered 2016-05-30: 50 mg via ORAL
  Filled 2016-05-30: qty 1

## 2016-05-30 NOTE — ED Notes (Signed)
Patient admits to [facility] with no plan but denies HI and AVH at this time. Patient reports increase anxiety. Respirations equal and unlabored, skin warm and dry. NAD. Will medicate patient per MAR. Plan of care discussed. Encouragement and support provided and safety maintain. Q 15 min safety checks in place.

## 2016-05-30 NOTE — ED Notes (Signed)
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  EMS states blood sugar 448, has no taken insulin in 2 weeks. Also states he has an abscess in his mouth and groin.

## 2016-05-30 NOTE — BH Assessment (Signed)
[facility] Assessment Progress Note  Per Thedore Mins, MD, this pt would benefit from admission to the [facility] at this time.  Berneice Heinrich, RN, [facility] has assigned pt to Obs 3.  Pt has signed Voluntary Admission and Consent for Treatment, as well as Consent to Release Information to his mother, and signed forms have been faxed to [facility].  Pt's nurse, Rudean Hitt, has been notified, and agrees to send original paperwork along with pt via Juel Burrow, and to call report to [phone].  Doylene Canning, MA Triage Specialist [phone]

## 2016-05-30 NOTE — BH Assessment (Deleted)
[facility] Assessment Progress Note  Per Thedore Mins, MD, this pt requires psychiatric hospitalization at this time.  Berneice Heinrich, RN, [facility] has assigned pt to [facility] Rm 404-1.  Pt presents under IVC initiated by law enforcement and upheld by Dr Jannifer Franklin, and IVC documents have been faxed to [facility].  Pt's nurse, Carlisle Beers, has been notified, and agrees to call report to [phone].  Pt is to be transported via [employer].  Doylene Canning, MA Triage Specialist [phone]

## 2016-05-30 NOTE — ED Notes (Addendum)
Pt declined blood draw sts "im not giving any blood or urine until the doctor comes in if I can't have coffee and a sandwich".  RN Ilene notified.  RN informed me that pt cbg >400

## 2016-05-30 NOTE — Consult Note (Signed)
[place] [facility] Consult   Reason for Consult:  Substance abuse with suicide threat  Referring Physician:  EDP Patient Identification: [name] MRN:  [id number] Principal Diagnosis: Substance induced mood disorder ([name]) Diagnosis:   Patient Active Problem List   Diagnosis Date Noted  . Substance abuse [F19.10] 05/30/2016    Priority: High  . Substance induced mood disorder ([name]) [F19.94] 05/30/2016    Priority: High  . Tobacco use disorder [F17.200] 12/24/2015  . DM hyperosmolarity type I, uncontrolled ([name]) [E10.69, E10.65] 12/19/2015  . Type I diabetes mellitus with hyperosmolarity ([name]) [E10.69, E10.65] 12/19/2015  . Left shoulder pain [M25.512] 08/19/2015  . Generalized headache [R51] 07/22/2015  . Bilateral hip bursitis [M70.71, M70.72] 05/26/2015  . Depression [F32.9]   . Generalized anxiety disorder [F41.1] 03/29/2015  . Undifferentiated schizophrenia ([place]) [F20.3]   . Hip pain, bilateral [M25.551, M25.552] 11/18/2014  . Hyponatremia [E87.1] 06/17/2012  . Dehydration [E86.0] 06/17/2012    Total Time spent with patient: 45 minutes  Subjective:   [name] is a 30 y.o. male patient admitted with substance abuse.  HPI:  Patient reported to the ED after abusing a crystalline substance for the past 3 days and told the ED if he would kill himself.  Today, he denies suicidal/homicidal ideations, hallucinations, and withdrawal symptoms.  Disheleved and reports being very hungry.  Past Psychiatric History: schizophrenia  Risk to Self: Suicidal Ideation: Yes-Currently Present Suicidal Intent: Yes-Currently Present Is patient at risk for suicide?: Yes Suicidal Plan?: Yes-Currently Present Specify Current Suicidal Plan: cut throat with a kitchen knife Access to Means: Yes Specify Access to Suicidal Means: has a kitchen knife at home What has been your use of drugs/alcohol within the last 12 months?: see above How many times?: 10 Other Self Harm  Risks: none Triggers for Past Attempts: Unknown Intentional Self Injurious Behavior: None Risk to Others: Homicidal Ideation: No Thoughts of Harm to Others: No Current Homicidal Intent: No Current Homicidal Plan: No Access to Homicidal Means: No History of harm to others?: No Assessment of Violence: None Noted Violent Behavior Description: none noted Does patient have access to weapons?: No Criminal Charges Pending?: No Does patient have a court date: No Prior Inpatient Therapy: Prior Inpatient Therapy: Yes Prior Therapy Dates: 12/2015 Prior Therapy Facilty/Provider(s): [facility] Reason for Treatment: schizoaffective Prior Outpatient Therapy: Prior Outpatient Therapy: No Does patient have an ACCT team?: No Does patient have Intensive In-House Services?  : No Does patient have [facility] services? : No Does patient have P4CC services?: No  Past Medical History:  Past Medical History  Diagnosis Date  . Depression   . Schizo affective schizophrenia ([name])   . Polysubstance abuse   . Scoliosis   . Chronic pain   . Noncompliance with medication regimen   . GERD (gastroesophageal reflux disease)   . Arthritis     "both hips" (12/21/2014)  . Asthma     Childhood  . Chicken pox   . Diabetes mellitus     Type 1 diabetes  . DKA (diabetic ketoacidoses) ([name]) 11/18/2014  . Hypertension     Past Surgical History  Procedure Laterality Date  . Hand surgery Left     Left thumb surgery   Family History:  Family History  Problem Relation Age of Onset  . Diabetes Mother    Family Psychiatric  History: none Social History:  History  Alcohol Use No    Comment: 12/21/2014 "I drink on a rare occasion"     History  Drug  Use  . Yes  . Special: Marijuana, Cocaine, Methamphetamines    Comment: crack cocaine    Social History   Social History  . Marital Status: Legally Separated    Spouse Name: N/A  . Number of Children: 0  . Years of Education: 13   Occupational History  .  Disability    Social History Main Topics  . Smoking status: Current Every Day Smoker -- 1.00 packs/day for 7 years    Types: Cigarettes  . Smokeless tobacco: Never Used  . Alcohol Use: No     Comment: 12/21/2014 "I drink on a rare occasion"  . Drug Use: Yes    Special: Marijuana, Cocaine, Methamphetamines     Comment: crack cocaine  . Sexual Activity: Yes   Other Topics Concern  . None   Social History Narrative   ** Merged History Encounter **    Fun: Video games      Additional Social History:    Allergies:   Allergies  Allergen Reactions  . Sulfa Antibiotics Other (See Comments)    Unknown - Childhood    Labs:  Results for orders placed or performed during the hospital encounter of 05/30/16 (from the past 48 hour(s))  POC CBG, ED     Status: Abnormal   Collection Time: 05/30/16 12:57 AM  Result Value Ref Range   Glucose-Capillary 440 (H) 65 - 99 mg/dL  Comprehensive metabolic panel     Status: Abnormal   Collection Time: 05/30/16  1:36 AM  Result Value Ref Range   Sodium 134 (L) 135 - 145 mmol/L   Potassium 3.9 3.5 - 5.1 mmol/L   Chloride 96 (L) 101 - 111 mmol/L   CO2 23 22 - 32 mmol/L   Glucose, Bld 406 (H) 65 - 99 mg/dL   BUN 10 6 - 20 mg/dL   Creatinine, Ser 0.72 0.61 - 1.24 mg/dL   Calcium 8.9 8.9 - 10.3 mg/dL   Total Protein 6.4 (L) 6.5 - 8.1 g/dL   Albumin 3.8 3.5 - 5.0 g/dL   AST 11 (L) 15 - 41 U/L   ALT 10 (L) 17 - 63 U/L   Alkaline Phosphatase 75 38 - 126 U/L   Total Bilirubin 1.5 (H) 0.3 - 1.2 mg/dL   GFR calc non Af Amer >60 >60 mL/min   GFR calc Af Amer >60 >60 mL/min    Comment: (NOTE) The eGFR has been calculated using the CKD EPI equation. This calculation has not been validated in all clinical situations. eGFR's persistently <60 mL/min signify possible Chronic Kidney Disease.    Anion gap 15 5 - 15  Ethanol     Status: None   Collection Time: 05/30/16  1:36 AM  Result Value Ref Range   Alcohol, Ethyl (B) <5 <5 mg/dL    Comment:         LOWEST DETECTABLE LIMIT FOR SERUM ALCOHOL IS 5 mg/dL FOR MEDICAL PURPOSES ONLY   cbc     Status: Abnormal   Collection Time: 05/30/16  1:36 AM  Result Value Ref Range   WBC 6.9 4.0 - 10.5 K/uL   RBC 5.10 4.22 - 5.81 MIL/uL   Hemoglobin 15.6 13.0 - 17.0 g/dL   HCT 42.7 39.0 - 52.0 %   MCV 83.7 78.0 - 100.0 fL   MCH 30.6 26.0 - 34.0 pg   MCHC 36.5 (H) 30.0 - 36.0 g/dL   RDW 12.3 11.5 - 15.5 %   Platelets 257 150 - 400 K/uL  Rapid  urine drug screen (hospital performed)     Status: None   Collection Time: 05/30/16  2:10 AM  Result Value Ref Range   Opiates NONE DETECTED NONE DETECTED   Cocaine NONE DETECTED NONE DETECTED   Benzodiazepines NONE DETECTED NONE DETECTED   Amphetamines NONE DETECTED NONE DETECTED   Tetrahydrocannabinol NONE DETECTED NONE DETECTED   Barbiturates NONE DETECTED NONE DETECTED    Comment:        DRUG SCREEN FOR MEDICAL PURPOSES ONLY.  IF CONFIRMATION IS NEEDED FOR ANY PURPOSE, NOTIFY LAB WITHIN 5 DAYS.        LOWEST DETECTABLE LIMITS FOR URINE DRUG SCREEN Drug Class       Cutoff (ng/mL) Amphetamine      1000 Barbiturate      200 Benzodiazepine   388 Tricyclics       828 Opiates          300 Cocaine          300 THC              50   CBG monitoring, ED     Status: Abnormal   Collection Time: 05/30/16  3:34 AM  Result Value Ref Range   Glucose-Capillary 303 (H) 65 - 99 mg/dL  CBG monitoring, ED     Status: Abnormal   Collection Time: 05/30/16  8:02 AM  Result Value Ref Range   Glucose-Capillary 283 (H) 65 - 99 mg/dL    Current Facility-Administered Medications  Medication Dose Route Frequency Provider Last Rate Last Dose  . acetaminophen (TYLENOL) tablet 650 mg  650 mg Oral Q4H PRN April Palumbo, MD      . cephALEXin [facility]) capsule 500 mg  500 mg Oral Q6H April Palumbo, MD   500 mg at 05/30/16 0601  . doxycycline (VIBRA-TABS) tablet 100 mg  100 mg Oral Q12H April Palumbo, MD   100 mg at 05/30/16 0034  . insulin aspart (novoLOG) injection  0-9 Units  0-9 Units Subcutaneous TID [facility] April Palumbo, MD      . ondansetron [facility]) tablet 4 mg  4 mg Oral Q8H PRN April Palumbo, MD       Current Outpatient Prescriptions  Medication Sig Dispense Refill  . amphetamine-dextroamphetamine (ADDERALL) 30 MG tablet Take 30 tablets by mouth daily.  0  . blood glucose meter kit and supplies KIT Dispense based on patient and insurance preference. Use up to four times daily as directed. (FOR ICD-9 250.00, 250.01). 1 each 0  . ibuprofen (ADVIL,MOTRIN) 800 MG tablet Take 1 tablet (800 mg total) by mouth 3 (three) times daily. 21 tablet 0  . insulin glargine (LANTUS) 100 UNIT/ML injection Inject 0.45 mLs (45 Units total) into the skin at bedtime. 10 mL 11  . insulin lispro (HUMALOG) 100 UNIT/ML injection Inject 10 Units into the skin 3 (three) times daily before meals.    . meloxicam (MOBIC) 15 MG tablet Take 15 mg by mouth daily as needed for pain.   0  . ondansetron (ZOFRAN ODT) 4 MG disintegrating tablet Take 1 tablet (4 mg total) by mouth every 8 (eight) hours as needed for nausea or vomiting. 20 tablet 0  . pantoprazole (PROTONIX) 20 MG tablet Take 1 tablet (20 mg total) by mouth 2 (two) times daily. 40 tablet 0  . penicillin v potassium (VEETID) 500 MG tablet Take 1 tablet (500 mg total) by mouth 3 (three) times daily. (Patient not taking: Reported on 05/04/2016) 21 tablet 0  . QUEtiapine (SEROQUEL) 300 MG  tablet Take 900 mg by mouth at bedtime.     . traZODone (DESYREL) 150 MG tablet Take 150 mg by mouth at bedtime.    . [DISCONTINUED] citalopram (CELEXA) 20 MG tablet Take 20 mg by mouth daily.    . [DISCONTINUED] FLUoxetine (PROZAC) 20 MG tablet Take 20 mg by mouth every morning.    . [DISCONTINUED] insulin NPH-regular Human (NOVOLIN 70/30) (70-30) 100 UNIT/ML injection Inject 40 Units into the skin 2 (two) times daily with a meal. 10 mL 0  . [DISCONTINUED] OLANZapine zydis (ZYPREXA) 10 MG disintegrating tablet Take 10 mg by mouth 2 (two) times  daily.    . [DISCONTINUED] sertraline (ZOLOFT) 50 MG tablet Take 50 mg by mouth daily. For depression. Just started med, have not picked up yet [facility] [address]      Musculoskeletal: Strength & Muscle Tone: within normal limits Gait & Station: normal Patient leans: N/A  Psychiatric Specialty Exam: Physical Exam  Constitutional: He is oriented to person, place, and time. He appears well-developed and well-nourished.  HENT:  Head: Normocephalic.  Neck: Normal range of motion.  Respiratory: Effort normal.  Musculoskeletal: Normal range of motion.  Neurological: He is alert and oriented to person, place, and time.  Skin: Skin is warm and dry.  Psychiatric: His speech is normal and behavior is normal. Judgment and thought content normal. His mood appears anxious. Cognition and memory are normal.    Review of Systems  Constitutional: Negative.   HENT: Negative.   Eyes: Negative.   Respiratory: Negative.   Cardiovascular: Negative.   Gastrointestinal: Negative.   Genitourinary: Negative.   Musculoskeletal: Negative.   Skin: Negative.   Neurological: Negative.   Endo/Heme/Allergies: Negative.   Psychiatric/Behavioral: Positive for substance abuse. The patient is nervous/anxious.     Blood pressure 113/87, pulse 101, temperature 97.7 F (36.5 C), temperature source Oral, resp. rate 16, height 6' 1" (1.854 m), weight 63.504 kg (140 lb), SpO2 100 %.Body mass index is 18.47 kg/(m^2).  General Appearance: Disheveled  Eye Contact:  Fair  Speech:  Normal Rate  Volume:  Normal  Mood:  Anxious  Affect:  Congruent  Thought Process:  Coherent and Descriptions of Associations: Intact  Orientation:  Full (Time, Place, and Person)  Thought Content:  WDL  Suicidal Thoughts:  No  Homicidal Thoughts:  No  Memory:  Immediate;   Fair Recent;   Fair Remote;   Fair  Judgement:  Fair  Insight:  Fair  Psychomotor Activity:  Decreased  Concentration:  Concentration: Fair and Attention  Span: Fair  Recall:  [employer] of Knowledge:  Fair  Language:  Fair  Akathisia:  No  Handed:  Right  AIMS (if indicated):     Assets:  Leisure Time Physical Health Resilience  ADL's:  Intact  Cognition:  WNL  Sleep:        Treatment Plan Summary: Daily contact with patient to assess and evaluate symptoms and progress in treatment, Medication management and Plan substance induced mood disorder:  -Crisis stabilization -Medication management:  Medical medications continued, no psychiatric medications started due to unknown substance that he abused.  Start Vistaril 25 mg every six hours PRN anxiety. -Individual and substance abuse counseling  Disposition: Supportive therapy provided about ongoing stressors.  Waylan Boga, NP 05/30/2016 10:33 AM Patient seen face-to-face for psychiatric evaluation, chart reviewed and case discussed with the physician extender and developed treatment plan. Reviewed the information documented and agree with the treatment plan. Corena Pilgrim, MD

## 2016-05-30 NOTE — ED Notes (Signed)
Bed: RESA Expected date:  Expected time:  Means of arrival:  Comments: EMS elevated blood sugar/tachycardic

## 2016-05-30 NOTE — Progress Notes (Addendum)
Inpatient Diabetes Program Recommendations  AACE/ADA: New Consensus Statement on Inpatient Glycemic Control (2015)  Target Ranges:  Prepandial:   less than 140 mg/dL      Peak postprandial:   less than 180 mg/dL (1-2 hours)      Critically ill patients:  140 - 180 mg/dL   Results for [name], [name] (MRN [id number]) as of 05/30/2016 11:50  Ref. Range 05/30/2016 00:57 05/30/2016 03:34 05/30/2016 08:02  Glucose-Capillary Latest Ref Range: 65-99 mg/dL 923 (H) 300 (H) 762 (H)    Admit with: Substance Abuse/ Suicidal Ideation  History: DM, Schizophrenia  Home DM Meds: Listed as Lantus 45 units QHS + Humalog 10 units tidwc        However, per Record Review, patient was d/c'd home on 70/30 Insulin 48 units bidwc by Dr. Jennet Maduro on 12/27/15  Current Insulin Orders: Novolog Sensitive Correction Scale/ SSI (0-9 units) TID AC      MD- Per Record review, note patient is supposed to be taking 70/30 insulin at home- 48 units bidwc  Please consider starting 70/30 here in hospital setting- Could start with 40 units bidwc (approximately 80% of home dose per records)     --Will follow patient during hospitalization--  Ambrose Finland RN, MSN, CDE Diabetes Coordinator Inpatient Glycemic Control Team Team Pager: [phone] (8a-5p)

## 2016-05-30 NOTE — ED Notes (Signed)
Report to [place] in [facility]
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
## 2016-05-30 NOTE — ED Notes (Signed)
Pt laying in bed calm and cooperative at this time laying in bed with eyes closed equal chest rise and fall. Awaiting disposition by provider.

## 2016-05-30 NOTE — ED Notes (Signed)
Pt reports wanting to be detoxed from crystal meth and has history of diabetes and has not taken insulin in the last 2 weeks. Pt cursing at time of triage and having episode of word salad and stating that he will kill himself if he leaves. Pt states that he will cut his throat with a knife. Charge nurse aware of pt statements. Security at bedside

## 2016-05-30 NOTE — ED Provider Notes (Addendum)
CSN: [id number]     Arrival date & time 05/30/16  0044 History  By signing my name below, I, Jasmyn B. Alexander, attest that this documentation has been prepared under the direction and in the presence of Garlene Apperson, MD.  Electronically Signed: Tedra Coupe. Sheppard Coil, ED Scribe. 05/30/2016. 1:39 AM.   Chief Complaint  Patient presents with  . Detox    Patient is a 30 y.o. male presenting with drug problem. The history is provided by the patient. No language interpreter was used.  Drug Problem This is a chronic problem. The current episode started more than 1 week ago. The problem occurs constantly. The problem has not changed since onset.Pertinent negatives include no chest pain and no abdominal pain. Nothing aggravates the symptoms. Nothing relieves the symptoms. He has tried nothing for the symptoms. The treatment provided no relief.    HPI Comments: [name] is a 30 y.o. male brought in by ambulance, with PMHx of DM Type 1, HTN, Polysubstance abuse, and Schizophrenia who presents to the Emergency Department complaining of detox from crystalline substance x 2 days. Pt reports last usage of crystalline compound with unknown etiology x 3 days ago. Pt reports he has not taken Lantus for blood sugar for 2 weeks. Denies any use of alcohol, cocaine, or marijuana. Unknown tetanus status.  Past Medical History  Diagnosis Date  . Depression   . Schizo affective schizophrenia ([place])   . Polysubstance abuse   . Scoliosis   . Chronic pain   . Noncompliance with medication regimen   . GERD (gastroesophageal reflux disease)   . Arthritis     "both hips" (12/21/2014)  . Asthma     Childhood  . Chicken pox   . Diabetes mellitus     Type 1 diabetes  . DKA (diabetic ketoacidoses) ([place]) 11/18/2014  . Hypertension    Past Surgical History  Procedure Laterality Date  . Hand surgery Left     Left thumb surgery   Family History  Problem Relation Age of Onset  . Diabetes Mother    Social  History  Substance Use Topics  . Smoking status: Current Every Day Smoker -- 1.00 packs/day for 7 years    Types: Cigarettes  . Smokeless tobacco: Never Used  . Alcohol Use: No     Comment: 12/21/2014 "I drink on a rare occasion"    Review of Systems  Constitutional: Negative for fever.  Cardiovascular: Negative for chest pain.  Gastrointestinal: Negative for abdominal pain.  Psychiatric/Behavioral: Negative for hallucinations and self-injury. The patient is hyperactive.   All other systems reviewed and are negative.     Allergies  Sulfa antibiotics  Home Medications   Prior to Admission medications   Medication Sig Start Date End Date Taking? Authorizing Provider  amphetamine-dextroamphetamine (ADDERALL) 30 MG tablet Take 30 tablets by mouth daily. 03/14/16   Historical Provider, MD  blood glucose meter kit and supplies KIT Dispense based on patient and insurance preference. Use up to four times daily as directed. (FOR ICD-9 250.00, 250.01). 05/04/16   Gareth Morgan, MD  ibuprofen (ADVIL,MOTRIN) 800 MG tablet Take 1 tablet (800 mg total) by mouth 3 (three) times daily. 04/04/16   Merrily Pew, MD  insulin glargine (LANTUS) 100 UNIT/ML injection Inject 0.45 mLs (45 Units total) into the skin at bedtime. 04/04/16   Merrily Pew, MD  insulin lispro (HUMALOG) 100 UNIT/ML injection Inject 10 Units into the skin 3 (three) times daily before meals.    Historical Provider, MD  meloxicam (MOBIC) 15 MG tablet Take 15 mg by mouth daily as needed for pain.  03/14/16   Historical Provider, MD  ondansetron (ZOFRAN ODT) 4 MG disintegrating tablet Take 1 tablet (4 mg total) by mouth every 8 (eight) hours as needed for nausea or vomiting. 05/04/16   Gareth Morgan, MD  pantoprazole (PROTONIX) 20 MG tablet Take 1 tablet (20 mg total) by mouth 2 (two) times daily. 05/04/16   Gareth Morgan, MD  penicillin v potassium (VEETID) 500 MG tablet Take 1 tablet (500 mg total) by mouth 3 (three) times  daily. Patient not taking: Reported on 05/04/2016 04/04/16   Merrily Pew, MD  QUEtiapine (SEROQUEL) 300 MG tablet Take 900 mg by mouth at bedtime.     Historical Provider, MD  traZODone (DESYREL) 150 MG tablet Take 150 mg by mouth at bedtime.    Historical Provider, MD   BP 121/89 mmHg  Pulse 108  Temp(Src) 97.7 F (36.5 C) (Oral)  Resp 14  Ht _0  (1.854 m)  Wt 140 lb (63.504 kg)  BMI 18.47 kg/m2  SpO2 100% Physical Exam  Constitutional: He is oriented to person, place, and time. He appears well-developed and well-nourished. No distress.  HENT:  Head: Normocephalic and atraumatic.  Mouth/Throat: Oropharynx is clear and moist. No oropharyngeal exudate.  Moist mucous membranes   Eyes: Conjunctivae are normal. Pupils are equal, round, and reactive to light.  Neck: Normal range of motion. Neck supple. No JVD present.  Trachea midline No bruit  Cardiovascular: Normal rate and normal heart sounds.   Tachycardia. Tangential circumstantial pressure.  Pulmonary/Chest: Effort normal and breath sounds normal. No stridor. No respiratory distress.  Abdominal: Soft. Bowel sounds are normal. He exhibits no distension. There is no tenderness. There is no rebound and no guarding.  Musculoskeletal: Normal range of motion.  Neurological: He is alert and oriented to person, place, and time. He has normal reflexes.  Skin: Skin is warm and dry.  Draining deflated abscess pubic symphysis. 1.4 cm in diameter with mild surrounding erythema.    Psychiatric: His speech is tangential. Thought content is delusional.  Nursing note and vitals reviewed.   ED Course  Procedures (including critical care time) DIAGNOSTIC STUDIES: Oxygen Saturation is 100% on RA, normal by my interpretation.    COORDINATION OF CARE: 1:33 AM-Discussed treatment plan which includes CBC panel, CMP, Rapid Urine drug screen, and EKG with pt at bedside and pt agreed to plan.    Labs Review Labs Reviewed  CBG MONITORING, ED -  Abnormal; Notable for the following:    Glucose-Capillary 440 (*)    All other components within normal limits  COMPREHENSIVE METABOLIC PANEL  ETHANOL  CBC  URINE RAPID DRUG SCREEN, HOSP PERFORMED    Imaging Review No results found. I have personally reviewed and evaluated these images and lab results as part of my medical decision-making.   EKG Interpretation None      MDM   Final diagnoses:  None    Filed Vitals:   05/30/16 0330 05/30/16 0401  BP: 115/79 117/85  Pulse: 104 117  Temp:    Resp: 15 25   Results for orders placed or performed during the hospital encounter of 05/30/16  Comprehensive metabolic panel  Result Value Ref Range   Sodium 134 (L) 135 - 145 mmol/L   Potassium 3.9 3.5 - 5.1 mmol/L   Chloride 96 (L) 101 - 111 mmol/L   CO2 23 22 - 32 mmol/L   Glucose, Bld 406 (H) 65 -  99 mg/dL   BUN 10 6 - 20 mg/dL   Creatinine, Ser 0.72 0.61 - 1.24 mg/dL   Calcium 8.9 8.9 - 10.3 mg/dL   Total Protein 6.4 (L) 6.5 - 8.1 g/dL   Albumin 3.8 3.5 - 5.0 g/dL   AST 11 (L) 15 - 41 U/L   ALT 10 (L) 17 - 63 U/L   Alkaline Phosphatase 75 38 - 126 U/L   Total Bilirubin 1.5 (H) 0.3 - 1.2 mg/dL   GFR calc non Af Amer >60 >60 mL/min   GFR calc Af Amer >60 >60 mL/min   Anion gap 15 5 - 15  Ethanol  Result Value Ref Range   Alcohol, Ethyl (B) <5 <5 mg/dL  cbc  Result Value Ref Range   WBC 6.9 4.0 - 10.5 K/uL   RBC 5.10 4.22 - 5.81 MIL/uL   Hemoglobin 15.6 13.0 - 17.0 g/dL   HCT 42.7 39.0 - 52.0 %   MCV 83.7 78.0 - 100.0 fL   MCH 30.6 26.0 - 34.0 pg   MCHC 36.5 (H) 30.0 - 36.0 g/dL   RDW 12.3 11.5 - 15.5 %   Platelets 257 150 - 400 K/uL  Rapid urine drug screen (hospital performed)  Result Value Ref Range   Opiates NONE DETECTED NONE DETECTED   Cocaine NONE DETECTED NONE DETECTED   Benzodiazepines NONE DETECTED NONE DETECTED   Amphetamines NONE DETECTED NONE DETECTED   Tetrahydrocannabinol NONE DETECTED NONE DETECTED   Barbiturates NONE DETECTED NONE DETECTED   POC CBG, ED  Result Value Ref Range   Glucose-Capillary 440 (H) 65 - 99 mg/dL  CBG monitoring, ED  Result Value Ref Range   Glucose-Capillary 303 (H) 65 - 99 mg/dL   No results found.  Medications  ondansetron (ZOFRAN) tablet 4 mg (not administered)  acetaminophen (TYLENOL) tablet 650 mg (not administered)  LORazepam (ATIVAN) tablet 1 mg (not administered)  insulin aspart (novoLOG) injection 0-9 Units (not administered)  sodium chloride 0.9 % bolus 2,000 mL (2,000 mLs Intravenous New Bag/Given 05/30/16 0156)  Tdap (BOOSTRIX) injection 0.5 mL (0.5 mLs Intramuscular Given 05/30/16 0159)  vancomycin (VANCOCIN) IVPB 1000 mg/200 mL premix (0 mg Intravenous Stopped 05/30/16 0404)  piperacillin-tazobactam (ZOSYN) IVPB 3.375 g (0 g Intravenous Stopped 05/30/16 0246)  insulin aspart (novoLOG) injection 4 Units (4 Units Intravenous Given 05/30/16 0335)  insulin aspart (novoLOG) injection 2 Units (2 Units Subcutaneous Given 05/30/16 0410)  LORazepam (ATIVAN) tablet 0.5 mg (0.5 mg Oral Given 05/30/16 0409)   One dose of ativan as patient stated he was anxious.    Treated for abscess that has mild surrounding erythrma with one dose of IV antibiotics.  Now states he is suicidal and going to slit his throat if d/ced home.  Will need to be seen by psychiatry. Will transition to PO meds for draining wound and Sliding scale coverage for DM as he is not taking his medications at home  and have cleared by psychiatry.     Will change antibiotics for decompressed abscess to doxycycline and keflex.   Tetanus was updated.    TO SAPU for TTS.       I personally performed the services described in this documentation, which was scribed in my presence. The recorded information has been reviewed and is accurate.      Veatrice Kells, MD 05/30/16 0515  Kellan Boehlke, MD 05/30/16 2595

## 2016-05-30 NOTE — ED Notes (Signed)
Patient at the window constantly asking for food.  His CBG this morning was 283 and he received 5 units of insulin for that.  When breakfast came he immediately asked for double portions.  He has been told repeatedly that he was not going to get them and that we needed to get his blood sugar down a bit more.  He has been very loud and childish in his behaviors.

## 2016-05-30 NOTE — ED Notes (Signed)
Pt currently calm and cooperative following instructions of staff.  Pt informed of need for a urine sample and urinal given to pt. IV fluids currently infusing.

## 2016-05-30 NOTE — ED Notes (Signed)
Patient noted in room. No complaints, stable, in no acute distress. Q15 minute rounds and monitoring via security cameras continue for safety. 

## 2016-05-30 NOTE — ED Notes (Signed)
Patient is cursing staff and demanding food and declining care.  Dr. Nicanor Alcon notified and at bedside, security at bedside.  Requested that patient comply with medical treatment.

## 2016-05-30 NOTE — Progress Notes (Signed)
Pt with [facility] 9 ED visits for detox and BH issues and 2 medical admissions (in  January 2017)  in the last 6 months Last [facility] in January 2017 at [facility] has made attempts to reach pt but without success Case closed in March 2017  NO ED CP Pt with pcp - [facility] [place] listed

## 2016-05-30 NOTE — ED Notes (Signed)
Patient has slept most of the day.  When he does wake up he is very focused on food.  He is constantly demanding milk or juice knowing that his blood sugar is elevated.  Will continue to monitor and to maintain safety.

## 2016-05-30 NOTE — ED Notes (Signed)
Patient and belongings have been wanded.  

## 2016-05-30 NOTE — BH Assessment (Signed)
[facility] Assessment Progress Note  Per Berneice Heinrich, RN, [facility], pt's behavior is currently unsuitable for admission to the Observation Unit.  This was staffed with Nanine Means, DNP.  Pt is to be retained at [facility] overnight.  Pt's nurse, Dawnaly, has been notified.  Doylene Canning, MA Triage Specialist [phone]

## 2016-05-30 NOTE — ED Notes (Signed)
Pt awake and asking for coffee and ativan. Dr.Palumbo notified. Per MD pt okayed to have coffee at this time but no food. Pt given coffee

## 2016-05-31 LAB — CBG MONITORING, ED: GLUCOSE-CAPILLARY: 333 mg/dL — AB (ref 65–99)

## 2016-05-31 MED ORDER — INSULIN ASPART 100 UNIT/ML ~~LOC~~ SOLN
4.0000 [IU] | Freq: Once | SUBCUTANEOUS | Status: AC
  Administered 2016-05-31: 4 [IU] via SUBCUTANEOUS
  Filled 2016-05-31: qty 1

## 2016-05-31 NOTE — ED Notes (Signed)
Pt yelling in hall cussing and yelling for food. Pt provided 2 pk crackers, and 1 skim milk. Pt demanding more food. Orders received for insuline for glucose of 333. Pt sitting on hamper, demanding more food and screaming out, attempting to wake other patients screaming out that he wants [employer]. Pt was reminded that he is volentary and pt wants discharge. Physician notified and discharging pt.

## 2016-05-31 NOTE — ED Notes (Signed)
Pt reporting pain 8/10 in his chest area, pt no displaying other symptomology at this time. Pt requesting to see the MD. And EDP called. EDP responded that pt will not be getting any additional medications at this time and that if the opertuinity arises she will come back. Message reported to pt.

## 2016-05-31 NOTE — ED Provider Notes (Signed)
Voluntary and was scheduled for discharge in the AM and is not suicidal and has been cleared by psychiatry and would like to be discharged to go to [employer].  So ordered  Luanna Weesner, MD 05/31/16 [phone]

## 2016-11-17 HISTORY — PX: CARDIAC CATHETERIZATION: SHX172

## 2016-12-05 ENCOUNTER — Other Ambulatory Visit: Payer: Self-pay

## 2016-12-05 NOTE — Patient Outreach (Signed)
[facility]) Care Management  12/05/2016  [name] May 08, 1986 [id number]   Telephonic Transition of Care Services  Referral Date:  11/24/16 Source:  HTA Issue:  Discharged to home on 11/20/2016 from [facility].  LACE 10.   Outreach call #1.  Patient has no listed contact number; h/o homeless.  Patient has no pending MD appt's in Epic.  PCP:  Dr. Fleet Contras.  Plan: RN CM will contact PCP for phone verification.    Simmie Davies, BSN, RN, CCM  [facility] Care Management Coordinator [phone] Direct [phone] Cell [phone] Office [phone] Fax [email or web address]

## 2016-12-07 ENCOUNTER — Other Ambulatory Visit: Payer: Self-pay

## 2016-12-07 NOTE — Patient Outreach (Signed)
[facility]) Care Management  12/07/2016  [name] Jan 31, 1986 [id number]   Telephonic Transition of Care Services  Referral Date:  11/24/16 Source:  HTA Issue:  Discharged to home on 11/20/2016 from [facility].  LACE 10.  PCP:  Dr. Fleet Contras.[phone] Insurance:  HTA  Outreach call #2.  Patient has no listed contact number; h/o homeless.  Patient has no pending MD appt's in Epic.  RN CM contacted PCP:  Dr. Fleet Contras.[phone] for patient phone verification.   Contact provides [phone] RN CM left HIPAA compliant voice message with name and number for call back.  RN CM scheduled for next contact call within one week.  [facility] Care Management Assistant notified of phone # update.   Simmie Davies, BSN, RN, CCM  [facility] Care Management Coordinator [phone] Direct [phone] Cell [phone] Office [phone] Fax [email or web address]

## 2016-12-08 ENCOUNTER — Other Ambulatory Visit: Payer: Self-pay

## 2016-12-08 NOTE — Patient Outreach (Signed)
[facility]) Care Management  12/08/2016  [name] 19-Nov-1986 [id number]   Telephonic Transition of Care Services  Referral Date:  11/24/16 Source:  HTA Issue:  Discharged to home on 11/20/2016 from [facility].  LACE 10.  PCP:  Dr. Fleet Contras.[phone] Insurance:  HTA  Outreach call #3. H/o homeless.  Patient has no pending MD appt's in Epic.  [phone] RN CM left HIPAA compliant voice message with name and number for call back.  RN CM mailed unsuccessful outreach letter; will close case within 2 weeks if no response.  Simmie Davies, BSN, RN, CCM  [facility] Care Management Coordinator [phone] Direct [phone] Cell [phone] Office [phone] Fax [email or web address]

## 2016-12-25 ENCOUNTER — Other Ambulatory Visit: Payer: Self-pay

## 2016-12-25 NOTE — Patient Outreach (Signed)
[facility]) Care Management  12/25/2016  JUSTON GOHEEN September 14, 1986 [id number]   Case Closure   RN CM unable to reach patient with several phone attempts; no response received back from patient with unsuccessful outreach letter.  THN and MD notified.     Simmie Davies, BSN, RN, CCM  [facility] Care Management Coordinator [phone] Direct [phone] Cell [phone] Office [phone] Fax [email or web address]

## 2017-01-01 ENCOUNTER — Other Ambulatory Visit: Payer: Self-pay

## 2017-01-01 NOTE — Patient Outreach (Signed)
[facility]) Care Management  01/01/2017  MERCER [name] Apr 16, 1986 [id number]   Mail Returned:  Unsuccessful Outreach Letter  Letter Date:  12/08/2016 [name] [address] [place] [address]  Address unknown at this time.   [name], BSN, [name], [name]  [facility] Care Management Coordinator [phone] Direct [phone] Cell [phone] Office [phone] Fax [name]

## 2017-06-03 ENCOUNTER — Encounter (HOSPITAL_COMMUNITY): Payer: Self-pay | Admitting: Emergency Medicine

## 2017-06-03 ENCOUNTER — Inpatient Hospital Stay (HOSPITAL_COMMUNITY)
Admission: EM | Admit: 2017-06-03 | Discharge: 2017-06-05 | DRG: 638 | Discharge status: Against Medical Advice | Payer: Medicare Other | Attending: Internal Medicine | Admitting: Internal Medicine

## 2017-06-03 ENCOUNTER — Inpatient Hospital Stay (HOSPITAL_COMMUNITY): Payer: Medicare Other

## 2017-06-03 DIAGNOSIS — E860 Dehydration: Secondary | ICD-10-CM | POA: Diagnosis present

## 2017-06-03 DIAGNOSIS — Z794 Long term (current) use of insulin: Secondary | ICD-10-CM | POA: Diagnosis not present

## 2017-06-03 DIAGNOSIS — F17210 Nicotine dependence, cigarettes, uncomplicated: Secondary | ICD-10-CM | POA: Diagnosis present

## 2017-06-03 DIAGNOSIS — E1010 Type 1 diabetes mellitus with ketoacidosis without coma: Principal | ICD-10-CM | POA: Diagnosis present

## 2017-06-03 DIAGNOSIS — F1410 Cocaine abuse, uncomplicated: Secondary | ICD-10-CM | POA: Diagnosis present

## 2017-06-03 DIAGNOSIS — D72829 Elevated white blood cell count, unspecified: Secondary | ICD-10-CM | POA: Diagnosis not present

## 2017-06-03 DIAGNOSIS — E871 Hypo-osmolality and hyponatremia: Secondary | ICD-10-CM | POA: Diagnosis present

## 2017-06-03 DIAGNOSIS — T383X6A Underdosing of insulin and oral hypoglycemic [antidiabetic] drugs, initial encounter: Secondary | ICD-10-CM | POA: Diagnosis present

## 2017-06-03 DIAGNOSIS — F1210 Cannabis abuse, uncomplicated: Secondary | ICD-10-CM | POA: Diagnosis present

## 2017-06-03 DIAGNOSIS — Z5321 Procedure and treatment not carried out due to patient leaving prior to being seen by health care provider: Secondary | ICD-10-CM | POA: Diagnosis not present

## 2017-06-03 DIAGNOSIS — Z833 Family history of diabetes mellitus: Secondary | ICD-10-CM

## 2017-06-03 DIAGNOSIS — K219 Gastro-esophageal reflux disease without esophagitis: Secondary | ICD-10-CM | POA: Diagnosis present

## 2017-06-03 DIAGNOSIS — R509 Fever, unspecified: Secondary | ICD-10-CM | POA: Diagnosis not present

## 2017-06-03 DIAGNOSIS — F259 Schizoaffective disorder, unspecified: Secondary | ICD-10-CM | POA: Diagnosis present

## 2017-06-03 DIAGNOSIS — Z882 Allergy status to sulfonamides status: Secondary | ICD-10-CM | POA: Diagnosis not present

## 2017-06-03 DIAGNOSIS — F329 Major depressive disorder, single episode, unspecified: Secondary | ICD-10-CM | POA: Diagnosis present

## 2017-06-03 DIAGNOSIS — G8929 Other chronic pain: Secondary | ICD-10-CM | POA: Diagnosis present

## 2017-06-03 DIAGNOSIS — E875 Hyperkalemia: Secondary | ICD-10-CM | POA: Diagnosis present

## 2017-06-03 DIAGNOSIS — E876 Hypokalemia: Secondary | ICD-10-CM | POA: Diagnosis not present

## 2017-06-03 DIAGNOSIS — Z9114 Patient's other noncompliance with medication regimen: Secondary | ICD-10-CM

## 2017-06-03 DIAGNOSIS — I10 Essential (primary) hypertension: Secondary | ICD-10-CM | POA: Diagnosis present

## 2017-06-03 DIAGNOSIS — N179 Acute kidney failure, unspecified: Secondary | ICD-10-CM | POA: Diagnosis present

## 2017-06-03 DIAGNOSIS — E1110 Type 2 diabetes mellitus with ketoacidosis without coma: Secondary | ICD-10-CM | POA: Diagnosis present

## 2017-06-03 LAB — BASIC METABOLIC PANEL
ANION GAP: 20 — AB (ref 5–15)
ANION GAP: 13 (ref 5–15)
Anion gap: 14 (ref 5–15)
BUN: 60 mg/dL — AB (ref 6–20)
BUN: 44 mg/dL — AB (ref 6–20)
BUN: 34 mg/dL — ABNORMAL HIGH (ref 6–20)
CALCIUM: 7.2 mg/dL — AB (ref 8.9–10.3)
CALCIUM: 7.3 mg/dL — AB (ref 8.9–10.3)
CALCIUM: 7.4 mg/dL — AB (ref 8.9–10.3)
CO2: 15 mmol/L — ABNORMAL LOW (ref 22–32)
CO2: 25 mmol/L (ref 22–32)
CO2: 24 mmol/L (ref 22–32)
CREATININE: 1.45 mg/dL — AB (ref 0.61–1.24)
Chloride: 95 mmol/L — ABNORMAL LOW (ref 101–111)
Chloride: 97 mmol/L — ABNORMAL LOW (ref 101–111)
Chloride: 98 mmol/L — ABNORMAL LOW (ref 101–111)
Creatinine, Ser: 2.21 mg/dL — ABNORMAL HIGH (ref 0.61–1.24)
Creatinine, Ser: 1.45 mg/dL — ABNORMAL HIGH (ref 0.61–1.24)
GFR calc Af Amer: 44 mL/min — ABNORMAL LOW (ref >60)
GFR calc Af Amer: >60 mL/min (ref >60)
GFR calc Af Amer: >60 mL/min (ref >60)
GFR, EST NON AFRICAN AMERICAN: 38 mL/min — AB (ref >60)
Glucose, Bld: 421 mg/dL — ABNORMAL HIGH (ref 65–99)
Glucose, Bld: 115 mg/dL — ABNORMAL HIGH (ref 65–99)
Glucose, Bld: 134 mg/dL — ABNORMAL HIGH (ref 65–99)
POTASSIUM: 3.5 mmol/L (ref 3.5–5.1)
POTASSIUM: 3.1 mmol/L — AB (ref 3.5–5.1)
POTASSIUM: 3.4 mmol/L — AB (ref 3.5–5.1)
SODIUM: 130 mmol/L — AB (ref 135–145)
SODIUM: 135 mmol/L (ref 135–145)
SODIUM: 136 mmol/L (ref 135–145)

## 2017-06-03 LAB — CBC
HCT: 38.6 % — ABNORMAL LOW (ref 39.0–52.0)
HEMATOCRIT: 36.3 % — AB (ref 39.0–52.0)
HEMOGLOBIN: 12 g/dL — AB (ref 13.0–17.0)
Hemoglobin: 12 g/dL — ABNORMAL LOW (ref 13.0–17.0)
MCH: 29.3 pg (ref 26.0–34.0)
MCH: 28.9 pg (ref 26.0–34.0)
MCHC: 31.1 g/dL (ref 30.0–36.0)
MCHC: 33.1 g/dL (ref 30.0–36.0)
MCV: 94.1 fL (ref 78.0–100.0)
MCV: 87.5 fL (ref 78.0–100.0)
PLATELETS: 454 10*3/uL — AB (ref 150–400)
Platelets: 375 10*3/uL (ref 150–400)
RBC: 4.1 MIL/uL — ABNORMAL LOW (ref 4.22–5.81)
RBC: 4.15 MIL/uL — AB (ref 4.22–5.81)
RDW: 12.2 % (ref 11.5–15.5)
RDW: 11.9 % (ref 11.5–15.5)
WBC: 35.4 10*3/uL — AB (ref 4.0–10.5)
WBC: 28.9 10*3/uL — ABNORMAL HIGH (ref 4.0–10.5)

## 2017-06-03 LAB — COMPREHENSIVE METABOLIC PANEL
ALBUMIN: 3.3 g/dL — AB (ref 3.5–5.0)
ALT: 15 U/L — AB (ref 17–63)
AST: 15 U/L (ref 15–41)
Alkaline Phosphatase: 131 U/L — ABNORMAL HIGH (ref 38–126)
Anion gap: 31 — ABNORMAL HIGH (ref 5–15)
BUN: 69 mg/dL — AB (ref 6–20)
CHLORIDE: 80 mmol/L — AB (ref 101–111)
CO2: 7 mmol/L — ABNORMAL LOW (ref 22–32)
CREATININE: 3.18 mg/dL — AB (ref 0.61–1.24)
Calcium: 6.7 mg/dL — ABNORMAL LOW (ref 8.9–10.3)
GFR calc Af Amer: 28 mL/min — ABNORMAL LOW (ref >60)
GFR calc non Af Amer: 24 mL/min — ABNORMAL LOW (ref >60)
GLUCOSE: 1197 mg/dL — AB (ref 65–99)
POTASSIUM: 6 mmol/L — AB (ref 3.5–5.1)
Sodium: 118 mmol/L — CL (ref 135–145)
Total Bilirubin: 2.2 mg/dL — ABNORMAL HIGH (ref 0.3–1.2)
Total Protein: 5.4 g/dL — ABNORMAL LOW (ref 6.5–8.1)

## 2017-06-03 LAB — I-STAT ARTERIAL BLOOD GAS, ED
Acid-base deficit: 19 mmol/L — ABNORMAL HIGH (ref 0.0–2.0)
BICARBONATE: 6.4 mmol/L — AB (ref 20.0–28.0)
O2 SAT: 98 %
PCO2 ART: 14.7 mmHg — AB (ref 32.0–48.0)
PH ART: 7.246 — AB (ref 7.350–7.450)
PO2 ART: 105 mmHg (ref 83.0–108.0)
Patient temperature: 97.1
TCO2: 7 mmol/L (ref 0–100)

## 2017-06-03 LAB — CBG MONITORING, ED
Glucose-Capillary: >600 mg/dL (ref 65–99)
Glucose-Capillary: >600 mg/dL (ref 65–99)
Glucose-Capillary: >600 mg/dL (ref 65–99)

## 2017-06-03 LAB — GLUCOSE, CAPILLARY
GLUCOSE-CAPILLARY: 452 mg/dL — AB (ref 65–99)
GLUCOSE-CAPILLARY: 344 mg/dL — AB (ref 65–99)
GLUCOSE-CAPILLARY: 387 mg/dL — AB (ref 65–99)
GLUCOSE-CAPILLARY: 178 mg/dL — AB (ref 65–99)
GLUCOSE-CAPILLARY: 143 mg/dL — AB (ref 65–99)
GLUCOSE-CAPILLARY: 132 mg/dL — AB (ref 65–99)
Glucose-Capillary: >600 mg/dL (ref 65–99)
Glucose-Capillary: 591 mg/dL (ref 65–99)
Glucose-Capillary: 265 mg/dL — ABNORMAL HIGH (ref 65–99)
Glucose-Capillary: 180 mg/dL — ABNORMAL HIGH (ref 65–99)
Glucose-Capillary: 127 mg/dL — ABNORMAL HIGH (ref 65–99)
Glucose-Capillary: 116 mg/dL — ABNORMAL HIGH (ref 65–99)
Glucose-Capillary: 142 mg/dL — ABNORMAL HIGH (ref 65–99)
Glucose-Capillary: 139 mg/dL — ABNORMAL HIGH (ref 65–99)
Glucose-Capillary: 154 mg/dL — ABNORMAL HIGH (ref 65–99)

## 2017-06-03 LAB — URINALYSIS, ROUTINE W REFLEX MICROSCOPIC
Bilirubin Urine: NEGATIVE
Glucose, UA: >=500 mg/dL — AB
Ketones, ur: 20 mg/dL — AB
Leukocytes, UA: NEGATIVE
NITRITE: NEGATIVE
PH: 5 (ref 5.0–8.0)
Protein, ur: NEGATIVE mg/dL
SPECIFIC GRAVITY, URINE: 1.018 (ref 1.005–1.030)
SQUAMOUS EPITHELIAL / LPF: NONE SEEN

## 2017-06-03 LAB — PHOSPHORUS: Phosphorus: 9.7 mg/dL — ABNORMAL HIGH (ref 2.5–4.6)

## 2017-06-03 LAB — HIV ANTIBODY (ROUTINE TESTING W REFLEX): HIV Screen 4th Generation wRfx: NONREACTIVE

## 2017-06-03 LAB — MAGNESIUM: Magnesium: 2.7 mg/dL — ABNORMAL HIGH (ref 1.7–2.4)

## 2017-06-03 LAB — MRSA PCR SCREENING: MRSA by PCR: NEGATIVE

## 2017-06-03 MED ORDER — FAMOTIDINE IN NACL 20-0.9 MG/50ML-% IV SOLN
20.0000 mg | Freq: Two times a day (BID) | INTRAVENOUS | Status: DC
  Administered 2017-06-03 – 2017-06-05 (×4): 20 mg via INTRAVENOUS
  Filled 2017-06-03 (×5): qty 50

## 2017-06-03 MED ORDER — SODIUM CHLORIDE 0.9 % IV SOLN: 250.0000 mL | INTRAVENOUS | Status: DC | PRN

## 2017-06-03 MED ORDER — SODIUM CHLORIDE 0.9 % IV BOLUS (SEPSIS)
1000.0000 mL | Freq: Once | INTRAVENOUS | Status: AC
  Administered 2017-06-03: 1000 mL via INTRAVENOUS

## 2017-06-03 MED ORDER — SODIUM CHLORIDE 0.9 % IV SOLN
INTRAVENOUS | Status: DC
  Administered 2017-06-05: 1000 mL via INTRAVENOUS

## 2017-06-03 MED ORDER — DEXTROSE-NACL 5-0.45 % IV SOLN: INTRAVENOUS | Status: DC

## 2017-06-03 MED ORDER — INSULIN ASPART 100 UNIT/ML ~~LOC~~ SOLN
10.0000 [IU] | Freq: Once | SUBCUTANEOUS | Status: AC
  Administered 2017-06-03: 10 [IU] via INTRAVENOUS
  Filled 2017-06-03: qty 1

## 2017-06-03 MED ORDER — ONDANSETRON HCL 4 MG/2ML IJ SOLN: 4.0000 mg | Freq: Four times a day (QID) | INTRAMUSCULAR | Status: DC | PRN

## 2017-06-03 MED ORDER — SODIUM CHLORIDE 0.9 % IV SOLN: INTRAVENOUS | Status: DC

## 2017-06-03 MED ORDER — DEXTROSE-NACL 5-0.45 % IV SOLN
INTRAVENOUS | Status: DC
  Administered 2017-06-03 – 2017-06-04 (×2): via INTRAVENOUS

## 2017-06-03 MED ORDER — ENOXAPARIN SODIUM 40 MG/0.4ML ~~LOC~~ SOLN
40.0000 mg | SUBCUTANEOUS | Status: DC
  Administered 2017-06-03 – 2017-06-04 (×2): 40 mg via SUBCUTANEOUS
  Filled 2017-06-03 (×2): qty 0.4

## 2017-06-03 MED ORDER — SODIUM CHLORIDE 0.9 % IV SOLN: INTRAVENOUS | Status: AC

## 2017-06-03 MED ORDER — SODIUM CHLORIDE 0.9 % IV SOLN
INTRAVENOUS | Status: DC
  Administered 2017-06-03 (×2): 5.4 [IU]/h via INTRAVENOUS
  Administered 2017-06-03: 16.2 [IU]/h via INTRAVENOUS
  Filled 2017-06-03 (×4): qty 1

## 2017-06-03 MED ORDER — CHLORHEXIDINE GLUCONATE 0.12 % MT SOLN
15.0000 mL | Freq: Two times a day (BID) | OROMUCOSAL | Status: DC
  Administered 2017-06-03: 15 mL via OROMUCOSAL

## 2017-06-03 MED ORDER — STERILE WATER FOR INJECTION IV SOLN
INTRAVENOUS | Status: DC
  Administered 2017-06-03 – 2017-06-04 (×4): via INTRAVENOUS
  Filled 2017-06-03 (×6): qty 850

## 2017-06-03 MED ORDER — ORAL CARE MOUTH RINSE
15.0000 mL | Freq: Two times a day (BID) | OROMUCOSAL | Status: DC
  Administered 2017-06-03 (×2): 15 mL via OROMUCOSAL

## 2017-06-03 MED ORDER — ALBUTEROL SULFATE (2.5 MG/3ML) 0.083% IN NEBU: 2.5000 mg | INHALATION_SOLUTION | RESPIRATORY_TRACT | Status: DC | PRN

## 2017-06-03 MED ORDER — ACETAMINOPHEN 325 MG PO TABS
650.0000 mg | ORAL_TABLET | ORAL | Status: DC | PRN
  Administered 2017-06-04 (×3): 650 mg via ORAL
  Filled 2017-06-03 (×3): qty 2

## 2017-06-03 MED ORDER — SODIUM BICARBONATE 8.4 % IV SOLN
50.0000 meq | Freq: Once | INTRAVENOUS | Status: AC
  Administered 2017-06-03: 50 meq via INTRAVENOUS
  Filled 2017-06-03: qty 50

## 2017-06-03 NOTE — ED Notes (Addendum)
Pt given ice chips per EDP  

## 2017-06-03 NOTE — Progress Notes (Signed)
ABG ordered and obtain on patient on room air.  Patient currently in process of being transferred to ICU.  RT for patient was notified.  Will continue to monitor.    Ref. Range 06/03/2017 07:53  Sample type Unknown ARTERIAL  pH, Arterial Latest Ref Range: 7.350 - 7.450  7.246 (L)  pCO2 arterial Latest Ref Range: 32.0 - 48.0 mmHg 14.7 (LL)  pO2, Arterial Latest Ref Range: 83.0 - 108.0 mmHg 105.0  TCO2 Latest Ref Range: 0 - 100 mmol/L 7  Acid-base deficit Latest Ref Range: 0.0 - 2.0 mmol/L 19.0 (H)  Bicarbonate Latest Ref Range: 20.0 - 28.0 mmol/L 6.4 (L)  O2 Saturation Latest Units: % 98.0  Patient temperature Unknown 97.1 F  Collection site Unknown RADIAL, ALLEN'S T.Marland KitchenMarland Kitchen

## 2017-06-03 NOTE — ED Notes (Addendum)
Pt removed all of his cardiac monitor leads and took out both of his IV's. Pt keeps taking off the bair hugger. Pt is A&O x4. This RN reiterated to the pt the importance of the medicine and fluids based on his labs and his state. EDP aware.

## 2017-06-03 NOTE — ED Notes (Signed)
ED Provider at bedside. 

## 2017-06-03 NOTE — ED Notes (Signed)
Lab called this RN stating pt's CMP lab tube hemolyzed. Phlebotomist attempted to stick pt for blood and pt refused because he said he wanted water.

## 2017-06-03 NOTE — ED Provider Notes (Signed)
[facility] DEPT Provider Note   CSN: [id number] Arrival date & time: 06/03/17  0010     History   Chief Complaint Chief Complaint  Patient presents with  . Hyperglycemia    HPI [name] is a 31 y.o. male.  The history is provided by the patient and medical records. No language interpreter was used.  Hyperglycemia  Associated symptoms: nausea, shortness of breath and vomiting   Associated symptoms: no abdominal pain    [name] is a 31 y.o. male  with a PMH of DM1 who presents to the Emergency Department complaining of nausea, vomiting and not feeling well x 2 days. Patient states that he was released from jail 3 days ago and has not taken any of his diabetes medication since release. Typically takes 70/30 but is out. Two days ago he started feeling nauseous. He has been unable to keep any food or fluids down today due to emesis. No meds taken prior to arrival for symptoms. No abdominal pain, fevers, chills. Intermittently feels short of breath. He states his mouth is very dry and begging for water, although states that he won't be able to keep water down. No alleviating or aggravating factors noted. Has not checked sugar in the last two days.   Past Medical History:  Diagnosis Date  . Arthritis    "both hips" (12/21/2014)  . Asthma    Childhood  . Chicken pox   . Chronic pain   . Depression   . Diabetes mellitus    Type 1 diabetes  . DKA (diabetic ketoacidoses) ([place]) 11/18/2014  . GERD (gastroesophageal reflux disease)   . Hypertension   . Noncompliance with medication regimen   . Polysubstance abuse   . Schizo affective schizophrenia ([place])   . Scoliosis     Patient Active Problem List   Diagnosis Date Noted  . DKA (diabetic ketoacidoses) ([place]) 06/03/2017  . Substance abuse 05/30/2016  . Substance induced mood disorder ([place]) 05/30/2016  . Tobacco use disorder 12/24/2015  . DM hyperosmolarity type I, uncontrolled ([place]) 12/19/2015  . Type I  diabetes mellitus with hyperosmolarity ([place]) 12/19/2015  . Left shoulder pain 08/19/2015  . Generalized headache 07/22/2015  . Bilateral hip bursitis 05/26/2015  . Depression   . Generalized anxiety disorder 03/29/2015  . Undifferentiated schizophrenia ([place])   . Hip pain, bilateral 11/18/2014  . Hyponatremia 06/17/2012  . Dehydration 06/17/2012    Past Surgical History:  Procedure Laterality Date  . HAND SURGERY Left    Left thumb surgery       Home Medications    Prior to Admission medications   Medication Sig Start Date End Date Taking? Authorizing Provider  blood glucose meter kit and supplies KIT Dispense based on patient and insurance preference. Use up to four times daily as directed. (FOR ICD-9 250.00, 250.01). 05/04/16   Gareth Morgan, MD  ibuprofen (ADVIL,MOTRIN) 800 MG tablet Take 1 tablet (800 mg total) by mouth 3 (three) times daily. Patient not taking: Reported on 05/30/2016 04/04/16   Mesner, Corene Cornea, MD  insulin glargine (LANTUS) 100 UNIT/ML injection Inject 0.45 mLs (45 Units total) into the skin at bedtime. 04/04/16   Mesner, Corene Cornea, MD  insulin lispro (HUMALOG) 100 UNIT/ML injection Inject 10 Units into the skin 3 (three) times daily before meals.    [provider]  ondansetron (ZOFRAN ODT) 4 MG disintegrating tablet Take 1 tablet (4 mg total) by mouth every 8 (eight) hours as needed for nausea or vomiting. Patient not taking:  Reported on 05/30/2016 05/04/16   Gareth Morgan, MD  pantoprazole (PROTONIX) 20 MG tablet Take 1 tablet (20 mg total) by mouth 2 (two) times daily. Patient not taking: Reported on 05/30/2016 05/04/16   Gareth Morgan, MD  penicillin v potassium (VEETID) 500 MG tablet Take 1 tablet (500 mg total) by mouth 3 (three) times daily. Patient not taking: Reported on 05/04/2016 04/04/16   Mesner, Corene Cornea, MD  QUEtiapine (SEROQUEL) 300 MG tablet Take 900 mg by mouth at bedtime.     [provider]  traZODone (DESYREL) 150 MG tablet Take  150 mg by mouth at bedtime.    [provider]    Family History Family History  Problem Relation Age of Onset  . Diabetes Mother     Social History Social History  Substance Use Topics  . Smoking status: Current Every Day Smoker    Packs/day: 1.00    Years: 7.00    Types: Cigarettes  . Smokeless tobacco: Never Used  . Alcohol use No     Comment: 12/21/2014 "I drink on a rare occasion"     Allergies   Sulfa antibiotics   Review of Systems Review of Systems  Respiratory: Positive for shortness of breath.   Gastrointestinal: Positive for nausea and vomiting. Negative for abdominal pain, constipation and diarrhea.  All other systems reviewed and are negative.    Physical Exam Updated Vital Signs BP (!) 106/56   Pulse (!) 104   Temp (!) 94.5 F (34.7 C) (Rectal)   Resp (!) 25   Ht _0  (1.854 m)   Wt 81.6 kg (180 lb)   SpO2 100%   BMI 23.75 kg/m   Physical Exam  Constitutional: He is oriented to person, place, and time. He appears well-developed and well-nourished. No distress.  Ill-appearing. Fruity odor.   HENT:  Head: Normocephalic and atraumatic.  Dry cracked lips, tacky mucus membranes.  Cardiovascular: Normal rate, regular rhythm and normal heart sounds.   No murmur heard. Pulmonary/Chest: Breath sounds normal. Tachypnea noted. No respiratory distress.  Abdominal: Soft. He exhibits no distension. There is no tenderness.  Musculoskeletal: He exhibits no edema.  Neurological: He is alert and oriented to person, place, and time.  Skin: Skin is warm and dry.  Nursing note and vitals reviewed.    ED Treatments / Results  Labs (all labs ordered are listed, but only abnormal results are displayed) Labs Reviewed  CBC - Abnormal; Notable for the following:       Result Value   WBC 35.4 (*)    RBC 4.10 (*)    Hemoglobin 12.0 (*)    HCT 38.6 (*)    Platelets 454 (*)    All other components within normal limits  URINALYSIS, ROUTINE W REFLEX  MICROSCOPIC - Abnormal; Notable for the following:    Color, Urine STRAW (*)    Glucose, UA >=500 (*)    Hgb urine dipstick SMALL (*)    Ketones, ur 20 (*)    Bacteria, UA RARE (*)    All other components within normal limits  CBG MONITORING, ED - Abnormal; Notable for the following:    Glucose-Capillary >600 (*)    All other components within normal limits  CBG MONITORING, ED - Abnormal; Notable for the following:    Glucose-Capillary >600 (*)    All other components within normal limits  CBG MONITORING, ED - Abnormal; Notable for the following:    Glucose-Capillary >600 (*)    All other components within normal  limits  BLOOD GAS, VENOUS  COMPREHENSIVE METABOLIC PANEL  HIV ANTIBODY (ROUTINE TESTING)  CBC  MAGNESIUM  PHOSPHORUS  BASIC METABOLIC PANEL  BASIC METABOLIC PANEL  BASIC METABOLIC PANEL  BASIC METABOLIC PANEL  BASIC METABOLIC PANEL  URINE DRUGS OF ABUSE SCREEN W ALC, ROUTINE (REF LAB)  I-STAT CHEM 8, ED    EKG  EKG Interpretation  Date/Time:  Sunday June 03 2017 00:41:34 EDT Ventricular Rate:  103 PR Interval:    QRS Duration: 138 QT Interval:  382 QTC Calculation: 501 R Axis:   -83 Text Interpretation:  Sinus tachycardia RBBB and LAFB hyperacute T waves Abnormal ekg Confirmed by Ripley Fraise [phone]) on 06/03/2017 12:48:21 AM       Radiology No results found.  Procedures Procedures (including critical care time)  CRITICAL CARE Performed by: Ozella Almond Anna Beaird   Total critical care time: 35 minutes  Critical care time was exclusive of separately billable procedures and treating other patients.  Critical care was necessary to treat or prevent imminent or life-threatening deterioration.  Critical care was time spent personally by me on the following activities: development of treatment plan with patient and/or surrogate as well as nursing, discussions with consultants, evaluation of patient's response to treatment, examination of patient,  obtaining history from patient or surrogate, ordering and performing treatments and interventions, ordering and review of laboratory studies, ordering and review of radiographic studies, pulse oximetry and re-evaluation of patient's condition.   Medications Ordered in ED Medications  dextrose 5 %-0.45 % sodium chloride infusion (not administered)  insulin regular (NOVOLIN R,HUMULIN R) 100 Units in sodium chloride 0.9 % 100 mL (1 Units/mL) infusion ( Intravenous Stopped 06/03/17 0247)  sodium chloride 0.9 % bolus 1,000 mL (0 mLs Intravenous Stopped 06/03/17 0246)    And  0.9 %  sodium chloride infusion (not administered)  sodium bicarbonate 150 mEq in sterile water 1,000 mL infusion (not administered)  sodium chloride 0.9 % bolus 1,000 mL (not administered)  0.9 %  sodium chloride infusion (not administered)  enoxaparin (LOVENOX) injection 40 mg (not administered)  famotidine (PEPCID) IVPB 20 mg premix (not administered)  acetaminophen (TYLENOL) tablet 650 mg (not administered)  ondansetron (ZOFRAN) injection 4 mg (not administered)  albuterol (PROVENTIL) (2.5 MG/3ML) 0.083% nebulizer solution 2.5 mg (not administered)  0.9 %  sodium chloride infusion (not administered)  0.9 %  sodium chloride infusion (not administered)  dextrose 5 %-0.45 % sodium chloride infusion (not administered)  sodium chloride 0.9 % bolus 1,000 mL (0 mLs Intravenous Stopped 06/03/17 0208)  sodium chloride 0.9 % bolus 1,000 mL (0 mLs Intravenous Stopped 06/03/17 0149)  sodium bicarbonate injection 50 mEq (50 mEq Intravenous Given 06/03/17 0343)  insulin aspart (novoLOG) injection 10 Units (10 Units Intravenous Given 06/03/17 0343)     Initial Impression / Assessment and Plan / ED Course  I have reviewed the triage vital signs and the nursing notes.  Pertinent labs & imaging results that were available during my care of the patient were reviewed by me and considered in my medical decision making (see chart for  details).     [name] is a 31 y.o. male with history of type 1 DM who presents to ED for nausea, vomiting x 2 days. He was released from jail 3 days ago and has not had any home insulin since he left. On exam, patient is tachypneic and appears quite dehydrated. Clinical concern for DKA. Plan: fluid, labs, vbg.   I-stat chem 8 results: Na 105, K+  7.6, Cl 79, CO2 undetectable, glucose > 700, BUN 85, Cr 1.9, H&H 13.3/39.   Glucostabilizer initiated, fluids running. Attending, Dr. Christy Gentles, spoke with critical care to evaluate for admission.   Patient seen by and discussed with Dr. Christy Gentles who agrees with treatment plan.    Final Clinical Impressions(s) / ED Diagnoses   Final diagnoses:  Diabetic ketoacidosis without coma associated with type 1 diabetes mellitus ([place])  Dehydration    New Prescriptions New Prescriptions   No medications on file     Cornell Gaber, Ozella Almond, PA-C 06/03/17 2957    Ripley Fraise, MD 06/03/17 [phone]

## 2017-06-03 NOTE — ED Notes (Signed)
Nurse currently starting IV and will draw labs. 

## 2017-06-03 NOTE — ED Notes (Addendum)
Pt repeatedly keeps asking for water. This RN and another RN reminded the pt that he can not get water because the EDP needs to see him first.

## 2017-06-03 NOTE — ED Provider Notes (Signed)
Pt pulled out all IVs because he didn't get water I advised that if we can not treat his DKA, he could die I did offered small cup of ice water and he agreed to have IV placed    Zadie Rhine, MD 06/03/17 [phone]

## 2017-06-03 NOTE — ED Provider Notes (Signed)
I have consulted critical care dr deterding She will have patient evaluated for admission due to severe DKA, pH of 6.9   Christopher Rhine, MD 06/03/17 [phone]

## 2017-06-03 NOTE — ED Notes (Signed)
IV team at bedside. Pt refused IV stick unless he got water. EDP gave pt water. IV team attempting to get IV.

## 2017-06-03 NOTE — ED Notes (Signed)
Admitting MD at bedside.

## 2017-06-03 NOTE — ED Triage Notes (Signed)
Per EMS, pt has been non compliant with medications. He was in jail for 4 months, recently got out of jail earlier this week. Upon EMS arrival, pt reports he has been staying in a hotel, eating and drinking vending machine foods. Pt reports N&V and dry mouth. Pt reports he has been unable to see since 6. PERRL. Cardiac monitor showing peaked T waves and tachycardia. EMS VS CBG 596, BP 116/68, HR 102, 100% room air. Unsure if ptt has way to monitor his blood sugar. Denies pain at this time.

## 2017-06-03 NOTE — H&P (Signed)
PULMONARY / CRITICAL CARE MEDICINE   Name: [name] MRN: [id number] DOB: 1986/11/08    ADMISSION DATE:  06/03/2017 CONSULTATION DATE: 06/03/17  REFERRING MD:  Dr [name]  CHIEF COMPLAINT:  DKA  HISTORY OF PRESENT ILLNESS:  34yoM with history of insulin dependent DM type 1, HTN, GERD, Schizophrenia, Anxiety, Depression, Substance abuse, Childhood Asthma, and Chronic pain, presents to the ER today in severe DKA. He reportedly has been incarcerated for the past 4 months and was just released from jail earlier this week. He has been living in a motel and eating vending machine food and says he ran out of his insulin 2 days ago. Now he reports N/V, SOB, and Extreme Thirst. In the ER he was given 2.5L NS and started on an insulin gtt, but then patient became confused and pulled out all of his IV's. At the time of my exam, patient is lying in bed c/o thirst and asking for water. IV team is attempting to place another PIV. He says he feels confused and isnt sure why he pulled out his IV's. He denies pain, cough, cp, diarrhea.   PAST MEDICAL HISTORY :  He  has a past medical history of Arthritis; Asthma; Chicken pox; Chronic pain; Depression; Diabetes mellitus; DKA (diabetic ketoacidoses) ([place]) (11/18/2014); GERD (gastroesophageal reflux disease); Hypertension; Noncompliance with medication regimen; Polysubstance abuse; Schizo affective schizophrenia ([place]); and Scoliosis.  PAST SURGICAL HISTORY: He  has a past surgical history that includes Hand surgery (Left).  Allergies  Allergen Reactions  . Sulfa Antibiotics Other (See Comments)    Unknown - Childhood    No current facility-administered medications on file prior to encounter.    Current Outpatient Prescriptions on File Prior to Encounter  Medication Sig  . blood glucose meter kit and supplies KIT Dispense based on patient and insurance preference. Use up to four times daily as directed. (FOR ICD-9 250.00, 250.01).  [name] ibuprofen  (ADVIL,MOTRIN) 800 MG tablet Take 1 tablet (800 mg total) by mouth 3 (three) times daily. (Patient not taking: Reported on 05/30/2016)  . insulin glargine (LANTUS) 100 UNIT/ML injection Inject 0.45 mLs (45 Units total) into the skin at bedtime.  . insulin lispro (HUMALOG) 100 UNIT/ML injection Inject 10 Units into the skin 3 (three) times daily before meals.  . ondansetron (ZOFRAN ODT) 4 MG disintegrating tablet Take 1 tablet (4 mg total) by mouth every 8 (eight) hours as needed for nausea or vomiting. (Patient not taking: Reported on 05/30/2016)  . pantoprazole (PROTONIX) 20 MG tablet Take 1 tablet (20 mg total) by mouth 2 (two) times daily. (Patient not taking: Reported on 05/30/2016)  . penicillin v potassium (VEETID) 500 MG tablet Take 1 tablet (500 mg total) by mouth 3 (three) times daily. (Patient not taking: Reported on 05/04/2016)  . QUEtiapine (SEROQUEL) 300 MG tablet Take 900 mg by mouth at bedtime.   . traZODone (DESYREL) 150 MG tablet Take 150 mg by mouth at bedtime.  . [DISCONTINUED] citalopram (CELEXA) 20 MG tablet Take 20 mg by mouth daily.  . [DISCONTINUED] FLUoxetine (PROZAC) 20 MG tablet Take 20 mg by mouth every morning.  . [DISCONTINUED] insulin NPH-regular Human (NOVOLIN 70/30) (70-30) 100 UNIT/ML injection Inject 40 Units into the skin 2 (two) times daily with a meal.  . [DISCONTINUED] OLANZapine zydis (ZYPREXA) 10 MG disintegrating tablet Take 10 mg by mouth 2 (two) times daily.  . [DISCONTINUED] sertraline (ZOLOFT) 50 MG tablet Take 50 mg by mouth daily. For depression. Just started med, have not picked  up yet [facility] [address]   FAMILY HISTORY:  His indicated that his mother is alive.   SOCIAL HISTORY: He  reports that he has been smoking Cigarettes.  He has a 7.00 pack-year smoking history. He has never used smokeless tobacco. He reports that he uses drugs, including Marijuana, Cocaine, and Methamphetamines. He reports that he does not drink alcohol.  REVIEW OF SYSTEMS:    Unable to obtain full 13-point ROS due to acute encephalopathy  VITAL SIGNS: BP (!) 106/56   Pulse (!) 104   Temp (!) 94.5 F (34.7 C) (Rectal)   Resp (!) 25   Ht '6\' 1"'$  (1.854 m)   Wt 81.6 kg (180 lb)   SpO2 100%   BMI 23.75 kg/m   INTAKE / OUTPUT: No intake/output data recorded.  PHYSICAL EXAMINATION: General: WDWN acutely ill appearing young male, lethargic appearing Neuro: somnolent but arousable to voice, obeying commands  HEENT: OP clear, MM dry, PERRL Cardiovascular: sinus tach, no m/r/g Lungs: CTA b/l, Tachypneic to 24, mild accessory muscle use Abdomen: soft flat NTND, BS+ Musculoskeletal: no clubbing, cyanosis, or edema Skin: no rashes  LABS:  BMET No results for input(s): NA, K, CL, CO2, BUN, CREATININE, GLUCOSE in the last 168 hours.  Electrolytes No results for input(s): CALCIUM, MG, PHOS in the last 168 hours.  CBC  Recent Labs Lab 06/03/17 0100  WBC 35.4*  HGB 12.0*  HCT 38.6*  PLT 454*    Coag's No results for input(s): APTT, INR in the last 168 hours.  Sepsis Markers No results for input(s): LATICACIDVEN, PROCALCITON, O2SATVEN in the last 168 hours.  ABG No results for input(s): PHART, PCO2ART, PO2ART in the last 168 hours.  Liver Enzymes No results for input(s): AST, ALT, ALKPHOS, BILITOT, ALBUMIN in the last 168 hours.  Cardiac Enzymes No results for input(s): TROPONINI, PROBNP in the last 168 hours.  Glucose  Recent Labs Lab 06/03/17 0056 06/03/17 0158 06/03/17 0401  GLUCAP >600* >600* >600*   LINES/TUBES: 18-G Right EJ 20G Right thumb 20G Right AC   ASSESSMENT / PLAN: 30yoM with history of insulin dependent DM type 1, HTN, GERD, Schizophrenia, Anxiety, Depression, Substance abuse, Childhood Asthma, and Chronic pain, presents to the ER today in severe DKA after running out of his insulin 2 days ago.   PULMONARY 1. Mild Tachypnea: in response to the severe metabolic acidosis. However no hypoxia or hypercapnea and is  not currently in respiratory distress.  2. History of Childhood Asthma: not on any inhalers at home; lungs CTA currently.  - will order PRN nebs   CARDIOVASCULAR 1. History of HTN: currently soft BP with SBP 100-110; will hold home anti-hypertensives  RENAL 1. AKI:  - creatinine 3.18 up from a baseline of 0.72; likely prerenal from the DKA - place foley catheter; continue IVF's  GASTROINTESTINAL 1. History of GERD: - start famotidine  HEMATOLOGIC 1. Pseudo-Hyponatremia: - due to the hyperglycemia; corrects from 118 to 136 when adjusted for the blood glucose   INFECTIOUS No active issues  ENDOCRINE 1. Severe DKA; Metabolic acidosis; Hyperkalemia: - pH 6.9 on arrival, with undetectable Bicarb, and blood glucose of 1197 - already received 3L NS; will give another 2L NS bolus - gave 1 amp Bicarb and started Bicarb gtt @ 125cc/hr - continue insulin gtt and follow DKA protocol - obtained CXR to rule out infection as precipitating factor for the DKA; on my review there are no pulmonary infiltrates - UA showed ketones but no signs of infection;  will also order UDS since he has a history of polysubstance abuse.  - Hyperkalemia with K 6.0, should correct with insulin as potassium begins moving intracellularly; continue to monitor closely.  NEUROLOGIC 1. Acute encephalopathy: - patient is confused and mildly somnolent but arousable to voice and is protecting his airway; feel this is due to his DKA  - continue to monitor.    FAMILY  - No family was present in the ER to update  60 minutes critical care time  Vernie Murders, MD [facility] Pager: [phone]  06/03/2017, 4:05 AM

## 2017-06-03 NOTE — ED Provider Notes (Signed)
Patient seen/examined in the Emergency Department in conjunction with Midlevel Provider Ward Patient reports elevated glucose and also nausea/vomiting Exam : awake but confused, tachypnea, appears dehydrated, smells of ketones Plan: pt is in obvious/florid DKA IV fluids/insulin ordered He will need admission I personally placed an IV in left EJ  PROCEDURE NOTE - EXTERNAL JUGULAR IV I personally placed IV in left EJ Area was cleansed appropriately IV was flushed appropriately Patient tolerated well     Zadie Rhine, MD 06/03/17 [phone]

## 2017-06-04 ENCOUNTER — Inpatient Hospital Stay (HOSPITAL_COMMUNITY): Payer: Medicare Other

## 2017-06-04 DIAGNOSIS — R509 Fever, unspecified: Secondary | ICD-10-CM

## 2017-06-04 DIAGNOSIS — E1010 Type 1 diabetes mellitus with ketoacidosis without coma: Principal | ICD-10-CM

## 2017-06-04 DIAGNOSIS — E876 Hypokalemia: Secondary | ICD-10-CM

## 2017-06-04 DIAGNOSIS — D72829 Elevated white blood cell count, unspecified: Secondary | ICD-10-CM

## 2017-06-04 LAB — BASIC METABOLIC PANEL
ANION GAP: 12 (ref 5–15)
Anion gap: 13 (ref 5–15)
Anion gap: 14 (ref 5–15)
Anion gap: 14 (ref 5–15)
BUN: 10 mg/dL (ref 6–20)
BUN: 12 mg/dL (ref 6–20)
BUN: 17 mg/dL (ref 6–20)
BUN: 23 mg/dL — AB (ref 6–20)
CALCIUM: 7.5 mg/dL — AB (ref 8.9–10.3)
CALCIUM: 7.6 mg/dL — AB (ref 8.9–10.3)
CO2: 22 mmol/L (ref 22–32)
CO2: 25 mmol/L (ref 22–32)
CO2: 25 mmol/L (ref 22–32)
CO2: 26 mmol/L (ref 22–32)
CREATININE: 1.28 mg/dL — AB (ref 0.61–1.24)
CREATININE: 1.17 mg/dL (ref 0.61–1.24)
CREATININE: 1.12 mg/dL (ref 0.61–1.24)
Calcium: 7.4 mg/dL — ABNORMAL LOW (ref 8.9–10.3)
Calcium: 7.6 mg/dL — ABNORMAL LOW (ref 8.9–10.3)
Chloride: 100 mmol/L — ABNORMAL LOW (ref 101–111)
Chloride: 98 mmol/L — ABNORMAL LOW (ref 101–111)
Chloride: 98 mmol/L — ABNORMAL LOW (ref 101–111)
Chloride: 99 mmol/L — ABNORMAL LOW (ref 101–111)
Creatinine, Ser: 0.95 mg/dL (ref 0.61–1.24)
GFR calc Af Amer: >60 mL/min (ref >60)
GFR calc Af Amer: >60 mL/min (ref >60)
GFR calc non Af Amer: >60 mL/min (ref >60)
GFR calc non Af Amer: >60 mL/min (ref >60)
GFR calc non Af Amer: >60 mL/min (ref >60)
GLUCOSE: 170 mg/dL — AB (ref 65–99)
GLUCOSE: 161 mg/dL — AB (ref 65–99)
GLUCOSE: 139 mg/dL — AB (ref 65–99)
Glucose, Bld: 170 mg/dL — ABNORMAL HIGH (ref 65–99)
POTASSIUM: 2.9 mmol/L — AB (ref 3.5–5.1)
Potassium: 2.8 mmol/L — ABNORMAL LOW (ref 3.5–5.1)
Potassium: 3 mmol/L — ABNORMAL LOW (ref 3.5–5.1)
Potassium: 3.2 mmol/L — ABNORMAL LOW (ref 3.5–5.1)
Sodium: 134 mmol/L — ABNORMAL LOW (ref 135–145)
Sodium: 137 mmol/L (ref 135–145)
Sodium: 137 mmol/L (ref 135–145)
Sodium: 138 mmol/L (ref 135–145)

## 2017-06-04 LAB — GLUCOSE, CAPILLARY
GLUCOSE-CAPILLARY: 169 mg/dL — AB (ref 65–99)
GLUCOSE-CAPILLARY: 191 mg/dL — AB (ref 65–99)
GLUCOSE-CAPILLARY: 170 mg/dL — AB (ref 65–99)
GLUCOSE-CAPILLARY: 151 mg/dL — AB (ref 65–99)
GLUCOSE-CAPILLARY: 138 mg/dL — AB (ref 65–99)
GLUCOSE-CAPILLARY: 135 mg/dL — AB (ref 65–99)
GLUCOSE-CAPILLARY: 284 mg/dL — AB (ref 65–99)
GLUCOSE-CAPILLARY: 334 mg/dL — AB (ref 65–99)
Glucose-Capillary: 129 mg/dL — ABNORMAL HIGH (ref 65–99)
Glucose-Capillary: 190 mg/dL — ABNORMAL HIGH (ref 65–99)
Glucose-Capillary: 169 mg/dL — ABNORMAL HIGH (ref 65–99)
Glucose-Capillary: 140 mg/dL — ABNORMAL HIGH (ref 65–99)
Glucose-Capillary: 163 mg/dL — ABNORMAL HIGH (ref 65–99)
Glucose-Capillary: 167 mg/dL — ABNORMAL HIGH (ref 65–99)
Glucose-Capillary: 152 mg/dL — ABNORMAL HIGH (ref 65–99)
Glucose-Capillary: 149 mg/dL — ABNORMAL HIGH (ref 65–99)

## 2017-06-04 LAB — PROCALCITONIN: Procalcitonin: 0.88 ng/mL

## 2017-06-04 MED ORDER — POTASSIUM CHLORIDE CRYS ER 20 MEQ PO TBCR
40.0000 meq | EXTENDED_RELEASE_TABLET | Freq: Once | ORAL | Status: AC
  Administered 2017-06-04: 40 meq via ORAL
  Filled 2017-06-04: qty 2

## 2017-06-04 MED ORDER — INSULIN ASPART 100 UNIT/ML ~~LOC~~ SOLN
0.0000 [IU] | Freq: Three times a day (TID) | SUBCUTANEOUS | Status: DC
  Administered 2017-06-04: 8 [IU] via SUBCUTANEOUS
  Administered 2017-06-05: 11 [IU] via SUBCUTANEOUS

## 2017-06-04 MED ORDER — KETOROLAC TROMETHAMINE 15 MG/ML IJ SOLN
15.0000 mg | Freq: Once | INTRAMUSCULAR | Status: AC
  Administered 2017-06-04: 15 mg via INTRAVENOUS
  Filled 2017-06-04: qty 1

## 2017-06-04 MED ORDER — INSULIN GLARGINE 100 UNIT/ML ~~LOC~~ SOLN
24.0000 [IU] | Freq: Every day | SUBCUTANEOUS | Status: DC
  Administered 2017-06-04 – 2017-06-05 (×2): 24 [IU] via SUBCUTANEOUS
  Filled 2017-06-04 (×2): qty 0.24

## 2017-06-04 MED ORDER — POTASSIUM CHLORIDE 10 MEQ/100ML IV SOLN
10.0000 meq | INTRAVENOUS | Status: AC
  Administered 2017-06-04 (×4): 10 meq via INTRAVENOUS
  Filled 2017-06-04 (×3): qty 100

## 2017-06-04 MED ORDER — PHENOL 1.4 % MT LIQD
1.0000 | OROMUCOSAL | Status: DC | PRN
  Filled 2017-06-04: qty 177

## 2017-06-04 NOTE — Progress Notes (Signed)
PULMONARY / CRITICAL CARE MEDICINE   Name: [name] MRN: [id number] DOB: 03/29/1986    ADMISSION DATE:  06/03/2017 CONSULTATION DATE: 06/03/17  REFERRING MD:  Dr Bebe Shaggy  CHIEF COMPLAINT:  DKA  HISTORY OF PRESENT ILLNESS:  30yoM with history of insulin dependent DM type 1, HTN, GERD, Schizophrenia, Anxiety, Depression, Substance abuse, Childhood Asthma, and Chronic pain, presents to the ER today in severe DKA. He reportedly has been incarcerated for the past 4 months and was just released from jail earlier this week. He has been living in a motel and eating vending machine food and says he ran out of his insulin 2 days ago. Now he reports N/V, SOB, and Extreme Thirst. In the ER he was given 2.5L NS and started on an insulin gtt, but then patient became confused and pulled out all of his IV's. At the time of my exam, patient is lying in bed c/o thirst and asking for water. IV team is attempting to place another PIV. He says he feels confused and isnt sure why he pulled out his IV's. He denies pain, cough, cp, diarrhea.   SUBJECTIVE:  Per RN, patient complains of sore throat, asking for pain medication.  Given tylenol. AG closed Low grade temp  VITAL SIGNS: BP 121/69   Pulse (!) 114   Temp 98.8 F (37.1 C) (Oral)   Resp (!) 21   Ht 6\' 1"  (1.854 m)   Wt 180 lb (81.6 kg)   SpO2 98%   BMI 23.75 kg/m   INTAKE / OUTPUT: I/O last 3 completed shifts: In: 8809.9 [I.V.:3909.9; IV Piggyback:4900] Out: 5695 [Urine:5695]  PHYSICAL EXAMINATION: General:  Adult male lying in bed in NAD HEENT: MM pink/ dry, PERRL, sclerae anicteric, posterior pharynx with erythema, no exudate, uvula midline, no adenopathy Neuro: Alert, oriented to person, place, not time CV: ST 110, s1s2 rrr, no m/r/g PULM: even/non-labored, lungs bilaterally clear 01-15-1979, non-tender, bsx4 active  Extremities: warm/dry, no edema  Skin: no rashes or lesions   LABS:  BMET  Recent Labs Lab 06/03/17 2357  06/04/17 0408 06/04/17 0854  NA 134* 138 137  K 3.2* 3.0* 2.8*  CL 98* 99* 98*  CO2 22 25 26   BUN 23* 17 12  CREATININE 1.28* 1.17 1.12  GLUCOSE 170* 170* 161*    Electrolytes  Recent Labs Lab 06/03/17 0405  06/03/17 2357 06/04/17 0408 06/04/17 0854  CALCIUM  --   < > 7.5* 7.4* 7.6*  MG 2.7*  --   --   --   --   PHOS 9.7*  --   --   --   --   < > = values in this interval not displayed.  CBC  Recent Labs Lab 06/03/17 0100 06/03/17 0405  WBC 35.4* 28.9*  HGB 12.0* 12.0*  HCT 38.6* 36.3*  PLT 454* 375    Coag's No results for input(s): APTT, INR in the last 168 hours.  Sepsis Markers No results for input(s): LATICACIDVEN, PROCALCITON, O2SATVEN in the last 168 hours.  ABG  Recent Labs Lab 06/03/17 0753  PHART 7.246*  PCO2ART 14.7*  PO2ART 105.0    Liver Enzymes  Recent Labs Lab 06/03/17 0404  AST 15  ALT 15*  ALKPHOS 131*  BILITOT 2.2*  ALBUMIN 3.3*    Cardiac Enzymes No results for input(s): TROPONINI, PROBNP in the last 168 hours.  Glucose  Recent Labs Lab 06/04/17 0222 06/04/17 0430 06/04/17 0536 06/04/17 0751 06/04/17 0900 06/04/17 1004  GLUCAP 191* 169* 140*  170* 163* 151*   LINES/TUBES: PIV   ASSESSMENT / PLAN: 30yoM with history of insulin dependent DM type 1, HTN, GERD, Schizophrenia, Anxiety, Depression, Substance abuse, Childhood Asthma, and Chronic pain, presents to the ER today in severe DKA after running out of his insulin 2 days ago.   PULMONARY 1. Mild Tachypnea: 2/2 severe metabolic acidosis. No hypoxia or hypercapnea and is not currently in respiratory distress.- now resolved. P: Continue pulmonary hygiene w/ IS and mobilze as able CXR 6/18 negative  2. History of Childhood Asthma: not on any inhalers at home; lungs CTA currently without wheezing. - will order PRN nebs   CARDIOVASCULAR 1. History of HTN P:  Hemodynamically stable Tele monitoring  RENAL 1. AKI- likely prerenal, resolved  2.  Pseudo-hyponatremia P: D/c bicarb gtt KCL 40 meq x 1 now  UA neg Trend BMP / mag/ urinary output Replace electrolytes as indicated Avoid nephrotoxic agents, ensure adequate renal perfusion  GASTROINTESTINAL 1. History of GERD: - continue famotidine  HEMATOLOGIC No acute issues  P:  Lovenox for DVT prophylaxis   INFECTIOUS Acute leukocytosis- improving unclear cause, low grade temp.  Recent dental infection 4/18 treated .   Sore throat- mild erythema, no exudate -UA clean, low-grade fever P: Send BC and UC Check throat for strep Trend fever  Monitor CBC Hold on abx for now Toradol x 1 for pain   ENDOCRINE 1. Severe DKA; Metabolic acidosis; Hyperkalemia: - AG is closed - transition to DKA phase 2 protocol  - lantus 24 units daily - SSI mod - carb mod diet - consult diabetes coordinator   NEUROLOGIC 1. Acute encephalopathy:- resolved  2. Hx of schizophrenia, poly substance abuse, depression P:  continue to monitor Obtain psych to re-establish care  Limit sedating meds Await UDS  FAMILY  - No family at bedside 6/18.  Global: Will transfer to telemetry.  TRH to assume primary care 6/19.  PCCM will sign off and be available as needed.   Posey Boyer, AGACNP-BC [facility] Pgr: [phone] or if no answer [phone] 06/04/2017, 12:03 PM  Attending Note:  31 year old male with male with DM who presents with DKA.  Patient had significant acidosis and required DKA protocol with AMS.  All that is much improved.  On exam, lungs are clear with no acute disease noted on CXR that I reviewed myself.  U/A is not impressive.  Discussed with PCCM-NP.  DKA:  - Diabetic educator  - D/C DKA protocol  - Basal insulin  - May eat  Leukocytosis: likely stress response  - PCT  - Pan culture  - F/U CXR today (done and reviewed)  Fever:  - As above, hold abx for now unless PCT comes back very positive  Hypokalemia:  - Replace  - BMET in  AM  Transfer to tele and to [facility] service with PCCM off 6/19.  Patient seen and examined, agree with above note.  I dictated the care and orders written for this patient under my direction.  Alyson Reedy, MD [phone]

## 2017-06-04 NOTE — Progress Notes (Signed)
eLink Physician-Brief Progress Note Patient Name: [name] DOB: Jul 26, 1986 MRN: [id number]   Date of Service  06/04/2017  HPI/Events of Note  K+ = 3.2 and Creatinine = 1.28.  eICU Interventions  Will replace K+.     Intervention Category Major Interventions: Electrolyte abnormality - evaluation and management  [name] 06/04/2017, 1:21 AM

## 2017-06-04 NOTE — Progress Notes (Signed)
Called for report to [id number]. Nurse to call back when available.

## 2017-06-04 NOTE — Progress Notes (Signed)
bmet called to elink Dr Arsenio Loader, potassium level 3.2, co2 22 and anion gap 14.

## 2017-06-04 NOTE — Progress Notes (Signed)
bmet called to elink Dr Delford Field, potassium level 3.4, co2 level 22 and anion gap 14. No new orders given to this rn.

## 2017-06-04 NOTE — Progress Notes (Signed)
bmet called to Dr Molli Knock, potassium 3.0 , c02 25, anion gap 14. No new orders given to this rn.

## 2017-06-04 NOTE — Progress Notes (Signed)
Inpatient Diabetes Program Recommendations  AACE/ADA: New Consensus Statement on Inpatient Glycemic Control (2015)  Target Ranges:  Prepandial:   less than 140 mg/dL      Peak postprandial:   less than 180 mg/dL (1-2 hours)      Critically ill patients:  140 - 180 mg/dL   Lab Results  Component Value Date   GLUCAP 151 (H) 06/04/2017   HGBA1C 13.9 (H) 01/22/2016    Review of Glycemic Control  Diabetes history: DM1 Outpatient Diabetes medications: Lantus 45 units hs + Humalog 10 units tid meal coverage (D/C'd previously on 70/30 48 units bid on 12/27/15 Current orders for Inpatient glycemic control: IV insulin drip  Inpatient Diabetes Program Recommendations:  Patient will need basal insulin 2 hrs prior to D/C of IV insulin drip and cover CBG when IV insulin discontinued. Could start with 80% Lantus 36 units + Humalog 8 units tid meal coverage if eats 50% OR 70/30 40 units bid (approximately 80% of previous home dose)  Thank you, Darel Hong E. Marcanthony Sleight, RN, MSN, CDE  Diabetes Coordinator Inpatient Glycemic Control Team Team Pager [phone] (8am-5pm) 06/04/2017 10:27 AM

## 2017-06-04 NOTE — Progress Notes (Signed)
Patient request pain medication for sore throat. Administered tylenol as per orders. Patient then yelled "I need something for extreme pain you bastard, I need a doctor this guy is a bastard and not helping me"   I have spoken with NP on the floor in request for more pain management.  Patient apologized shortly after his outburst.

## 2017-06-05 LAB — BASIC METABOLIC PANEL
Anion gap: 14 (ref 5–15)
BUN: 13 mg/dL (ref 6–20)
CALCIUM: 8.4 mg/dL — AB (ref 8.9–10.3)
CHLORIDE: 102 mmol/L (ref 101–111)
CO2: 22 mmol/L (ref 22–32)
CREATININE: 1.28 mg/dL — AB (ref 0.61–1.24)
GFR calc Af Amer: >60 mL/min (ref >60)
GFR calc non Af Amer: >60 mL/min (ref >60)
GLUCOSE: 356 mg/dL — AB (ref 65–99)
Potassium: 3.5 mmol/L (ref 3.5–5.1)
Sodium: 138 mmol/L (ref 135–145)

## 2017-06-05 LAB — URINE DRUGS OF ABUSE SCREEN W ALC, ROUTINE (REF LAB)
Amphetamines, Urine: NEGATIVE ng/mL
Barbiturate, Ur: NEGATIVE ng/mL
Benzodiazepine Quant, Ur: NEGATIVE ng/mL
COCAINE (METAB.): NEGATIVE ng/mL
Cannabinoid Quant, Ur: NEGATIVE ng/mL
ETHANOL U, QUAN: NEGATIVE %
METHADONE SCREEN, URINE: NEGATIVE ng/mL
Opiate Quant, Ur: NEGATIVE ng/mL
PHENCYCLIDINE, UR: NEGATIVE ng/mL
PROPOXYPHENE, URINE: NEGATIVE ng/mL

## 2017-06-05 LAB — CBC
HEMATOCRIT: 30.9 % — AB (ref 39.0–52.0)
Hemoglobin: 10.8 g/dL — ABNORMAL LOW (ref 13.0–17.0)
MCH: 28.9 pg (ref 26.0–34.0)
MCHC: 35 g/dL (ref 30.0–36.0)
MCV: 82.6 fL (ref 78.0–100.0)
PLATELETS: 202 10*3/uL (ref 150–400)
RBC: 3.74 MIL/uL — ABNORMAL LOW (ref 4.22–5.81)
RDW: 12.7 % (ref 11.5–15.5)
WBC: 7.1 10*3/uL (ref 4.0–10.5)

## 2017-06-05 LAB — GLUCOSE, CAPILLARY: Glucose-Capillary: 345 mg/dL — ABNORMAL HIGH (ref 65–99)

## 2017-06-05 LAB — PHOSPHORUS: Phosphorus: 1.6 mg/dL — ABNORMAL LOW (ref 2.5–4.6)

## 2017-06-05 LAB — MAGNESIUM: Magnesium: 2.7 mg/dL — ABNORMAL HIGH (ref 1.7–2.4)

## 2017-06-05 MED ORDER — INSULIN LISPRO 100 UNIT/ML ~~LOC~~ SOLN: 0.0000 [IU] | SUBCUTANEOUS | 1 refills | Status: DC

## 2017-06-05 MED ORDER — INSULIN GLARGINE 100 UNIT/ML ~~LOC~~ SOLN: 45.0000 [IU] | Freq: Every day | SUBCUTANEOUS | 1 refills | Status: DC

## 2017-06-05 MED ORDER — INSULIN GLARGINE 100 UNIT/ML ~~LOC~~ SOLN: 35.0000 [IU] | Freq: Every day | SUBCUTANEOUS | Status: DC

## 2017-06-05 MED ORDER — QUETIAPINE FUMARATE 300 MG PO TABS: 300.0000 mg | ORAL_TABLET | Freq: Every day | ORAL | Status: DC

## 2017-06-05 MED ORDER — BLOOD GLUCOSE MONITOR KIT: PACK | 0 refills | Status: DC

## 2017-06-05 NOTE — Progress Notes (Signed)
Patient leaving AMA. Explained AMA to patient and had him sign form. Patient received prescriptions for insulin and blood glucose meter kit. Patient educated on medication administration and compliance to prevent DKA in the future, and he verbalized understanding. Wheelchair provided for discharge.

## 2017-06-05 NOTE — Progress Notes (Signed)
  Patient was discharged AMA as per staff RN prior to my visit to his room.   Octavious Zidek [facility] 06/05/2017 3:18 PM

## 2017-06-06 ENCOUNTER — Emergency Department (HOSPITAL_COMMUNITY): Payer: Medicare Other

## 2017-06-06 ENCOUNTER — Inpatient Hospital Stay (HOSPITAL_COMMUNITY)
Admission: EM | Admit: 2017-06-06 | Discharge: 2017-06-11 | DRG: 638 | Disposition: A | Discharge status: Home / Self Care | Payer: Medicare Other | Attending: Internal Medicine | Admitting: Internal Medicine

## 2017-06-06 ENCOUNTER — Encounter (HOSPITAL_COMMUNITY): Payer: Self-pay

## 2017-06-06 DIAGNOSIS — E1010 Type 1 diabetes mellitus with ketoacidosis without coma: Secondary | ICD-10-CM | POA: Diagnosis present

## 2017-06-06 DIAGNOSIS — F329 Major depressive disorder, single episode, unspecified: Secondary | ICD-10-CM | POA: Diagnosis not present

## 2017-06-06 DIAGNOSIS — R079 Chest pain, unspecified: Secondary | ICD-10-CM | POA: Diagnosis present

## 2017-06-06 DIAGNOSIS — A419 Sepsis, unspecified organism: Secondary | ICD-10-CM | POA: Diagnosis present

## 2017-06-06 DIAGNOSIS — F17210 Nicotine dependence, cigarettes, uncomplicated: Secondary | ICD-10-CM | POA: Diagnosis present

## 2017-06-06 DIAGNOSIS — Z882 Allergy status to sulfonamides status: Secondary | ICD-10-CM

## 2017-06-06 DIAGNOSIS — R6510 Systemic inflammatory response syndrome (SIRS) of non-infectious origin without acute organ dysfunction: Secondary | ICD-10-CM | POA: Diagnosis not present

## 2017-06-06 DIAGNOSIS — E876 Hypokalemia: Secondary | ICD-10-CM | POA: Diagnosis present

## 2017-06-06 DIAGNOSIS — R112 Nausea with vomiting, unspecified: Secondary | ICD-10-CM | POA: Diagnosis present

## 2017-06-06 DIAGNOSIS — E1069 Type 1 diabetes mellitus with other specified complication: Secondary | ICD-10-CM

## 2017-06-06 DIAGNOSIS — Z79899 Other long term (current) drug therapy: Secondary | ICD-10-CM

## 2017-06-06 DIAGNOSIS — Z9119 Patient's noncompliance with other medical treatment and regimen: Secondary | ICD-10-CM

## 2017-06-06 DIAGNOSIS — K219 Gastro-esophageal reflux disease without esophagitis: Secondary | ICD-10-CM | POA: Diagnosis present

## 2017-06-06 DIAGNOSIS — F17200 Nicotine dependence, unspecified, uncomplicated: Secondary | ICD-10-CM | POA: Diagnosis not present

## 2017-06-06 DIAGNOSIS — N179 Acute kidney failure, unspecified: Secondary | ICD-10-CM | POA: Diagnosis present

## 2017-06-06 DIAGNOSIS — F32A Depression, unspecified: Secondary | ICD-10-CM | POA: Diagnosis present

## 2017-06-06 HISTORY — DX: Anxiety disorder, unspecified: F41.9

## 2017-06-06 HISTORY — DX: Pneumonia, unspecified organism: J18.9

## 2017-06-06 HISTORY — DX: Anemia, unspecified: D64.9

## 2017-06-06 HISTORY — DX: Unspecified asthma, uncomplicated: J45.909

## 2017-06-06 HISTORY — DX: Migraine, unspecified, not intractable, without status migrainosus: G43.909

## 2017-06-06 HISTORY — DX: Type 1 diabetes mellitus without complications: E10.9

## 2017-06-06 LAB — BASIC METABOLIC PANEL
Anion gap: 17 — ABNORMAL HIGH (ref 5–15)
Anion gap: 14 (ref 5–15)
BUN: 12 mg/dL (ref 6–20)
BUN: 11 mg/dL (ref 6–20)
CALCIUM: 9.1 mg/dL (ref 8.9–10.3)
CHLORIDE: 99 mmol/L — AB (ref 101–111)
CO2: 18 mmol/L — AB (ref 22–32)
CO2: 22 mmol/L (ref 22–32)
CREATININE: 1.25 mg/dL — AB (ref 0.61–1.24)
Calcium: 9.2 mg/dL (ref 8.9–10.3)
Chloride: 102 mmol/L (ref 101–111)
Creatinine, Ser: 1.08 mg/dL (ref 0.61–1.24)
GFR calc non Af Amer: >60 mL/min (ref >60)
GFR calc non Af Amer: >60 mL/min (ref >60)
GLUCOSE: 455 mg/dL — AB (ref 65–99)
Glucose, Bld: 300 mg/dL — ABNORMAL HIGH (ref 65–99)
POTASSIUM: 3.8 mmol/L (ref 3.5–5.1)
Potassium: 4.5 mmol/L (ref 3.5–5.1)
Sodium: 134 mmol/L — ABNORMAL LOW (ref 135–145)
Sodium: 138 mmol/L (ref 135–145)

## 2017-06-06 LAB — URINALYSIS, ROUTINE W REFLEX MICROSCOPIC
Bacteria, UA: NONE SEEN
Bilirubin Urine: NEGATIVE
Ketones, ur: 80 mg/dL — AB
Leukocytes, UA: NEGATIVE
Nitrite: NEGATIVE
PROTEIN: NEGATIVE mg/dL
SPECIFIC GRAVITY, URINE: 1.029 (ref 1.005–1.030)
pH: 6 (ref 5.0–8.0)

## 2017-06-06 LAB — GLUCOSE, CAPILLARY: GLUCOSE-CAPILLARY: 263 mg/dL — AB (ref 65–99)

## 2017-06-06 LAB — I-STAT VENOUS BLOOD GAS, ED
ACID-BASE DEFICIT: 8 mmol/L — AB (ref 0.0–2.0)
Bicarbonate: 17.6 mmol/L — ABNORMAL LOW (ref 20.0–28.0)
O2 SAT: 43 %
PO2 VEN: 26 mmHg — AB (ref 32.0–45.0)
TCO2: 19 mmol/L (ref 0–100)
pCO2, Ven: 35.4 mmHg — ABNORMAL LOW (ref 44.0–60.0)
pH, Ven: 7.303 (ref 7.250–7.430)

## 2017-06-06 LAB — RAPID URINE DRUG SCREEN, HOSP PERFORMED
AMPHETAMINES: NOT DETECTED
BARBITURATES: NOT DETECTED
Benzodiazepines: NOT DETECTED
Cocaine: NOT DETECTED
Opiates: NOT DETECTED
TETRAHYDROCANNABINOL: NOT DETECTED

## 2017-06-06 LAB — CBC
HCT: 34.6 % — ABNORMAL LOW (ref 39.0–52.0)
Hemoglobin: 12.2 g/dL — ABNORMAL LOW (ref 13.0–17.0)
MCH: 29 pg (ref 26.0–34.0)
MCHC: 35.3 g/dL (ref 30.0–36.0)
MCV: 82.4 fL (ref 78.0–100.0)
PLATELETS: 246 10*3/uL (ref 150–400)
RBC: 4.2 MIL/uL — AB (ref 4.22–5.81)
RDW: 11.8 % (ref 11.5–15.5)
WBC: 8.1 10*3/uL (ref 4.0–10.5)

## 2017-06-06 LAB — CBG MONITORING, ED
GLUCOSE-CAPILLARY: 452 mg/dL — AB (ref 65–99)
GLUCOSE-CAPILLARY: 320 mg/dL — AB (ref 65–99)
GLUCOSE-CAPILLARY: 274 mg/dL — AB (ref 65–99)
Glucose-Capillary: 327 mg/dL — ABNORMAL HIGH (ref 65–99)

## 2017-06-06 LAB — TROPONIN I: Troponin I: 0.03 ng/mL (ref <0.03)

## 2017-06-06 LAB — I-STAT TROPONIN, ED: Troponin i, poc: 0.02 ng/mL (ref 0.00–0.08)

## 2017-06-06 MED ORDER — SODIUM CHLORIDE 0.9 % IV SOLN
INTRAVENOUS | Status: DC
  Administered 2017-06-06: 2.7 [IU]/h via INTRAVENOUS
  Filled 2017-06-06: qty 1

## 2017-06-06 MED ORDER — PANTOPRAZOLE SODIUM 40 MG PO TBEC
40.0000 mg | DELAYED_RELEASE_TABLET | Freq: Every day | ORAL | Status: DC
  Administered 2017-06-07 – 2017-06-11 (×5): 40 mg via ORAL
  Filled 2017-06-06 (×5): qty 1

## 2017-06-06 MED ORDER — DEXTROSE 50 % IV SOLN: 25.0000 mL | INTRAVENOUS | Status: DC | PRN

## 2017-06-06 MED ORDER — GI COCKTAIL ~~LOC~~
30.0000 mL | Freq: Once | ORAL | Status: AC
  Administered 2017-06-06: 30 mL via ORAL
  Filled 2017-06-06: qty 30

## 2017-06-06 MED ORDER — DEXTROSE-NACL 5-0.45 % IV SOLN: INTRAVENOUS | Status: DC

## 2017-06-06 MED ORDER — SODIUM CHLORIDE 0.9 % IV BOLUS (SEPSIS)
1000.0000 mL | Freq: Once | INTRAVENOUS | Status: AC
  Administered 2017-06-06: 1000 mL via INTRAVENOUS

## 2017-06-06 MED ORDER — VANCOMYCIN HCL IN DEXTROSE 1-5 GM/200ML-% IV SOLN
1000.0000 mg | Freq: Three times a day (TID) | INTRAVENOUS | Status: DC
  Administered 2017-06-07 – 2017-06-08 (×4): 1000 mg via INTRAVENOUS
  Filled 2017-06-06 (×5): qty 200

## 2017-06-06 MED ORDER — ONDANSETRON HCL 4 MG PO TABS: 4.0000 mg | ORAL_TABLET | Freq: Four times a day (QID) | ORAL | Status: DC | PRN

## 2017-06-06 MED ORDER — GI COCKTAIL ~~LOC~~
30.0000 mL | Freq: Three times a day (TID) | ORAL | Status: DC | PRN
  Administered 2017-06-07 – 2017-06-09 (×5): 30 mL via ORAL
  Filled 2017-06-06 (×5): qty 30

## 2017-06-06 MED ORDER — DOXEPIN HCL 10 MG PO CAPS
100.0000 mg | ORAL_CAPSULE | Freq: Every day | ORAL | Status: DC
  Filled 2017-06-06: qty 1
  Filled 2017-06-06: qty 10

## 2017-06-06 MED ORDER — PIPERACILLIN-TAZOBACTAM 3.375 G IVPB
3.3750 g | Freq: Three times a day (TID) | INTRAVENOUS | Status: DC
  Administered 2017-06-07 – 2017-06-08 (×4): 3.375 g via INTRAVENOUS
  Filled 2017-06-06 (×5): qty 50

## 2017-06-06 MED ORDER — PIPERACILLIN-TAZOBACTAM 3.375 G IVPB 30 MIN
3.3750 g | Freq: Once | INTRAVENOUS | Status: AC
  Administered 2017-06-07: 3.375 g via INTRAVENOUS
  Filled 2017-06-06: qty 50

## 2017-06-06 MED ORDER — SODIUM CHLORIDE 0.9 % IV SOLN
INTRAVENOUS | Status: DC
  Administered 2017-06-06 – 2017-06-07 (×2): via INTRAVENOUS
  Administered 2017-06-07: 125 mL/h via INTRAVENOUS
  Administered 2017-06-08 (×2): via INTRAVENOUS

## 2017-06-06 MED ORDER — NICOTINE 21 MG/24HR TD PT24
21.0000 mg | MEDICATED_PATCH | TRANSDERMAL | Status: DC
  Administered 2017-06-06 – 2017-06-10 (×5): 21 mg via TRANSDERMAL
  Filled 2017-06-06 (×5): qty 1

## 2017-06-06 MED ORDER — PANTOPRAZOLE SODIUM 40 MG IV SOLR
40.0000 mg | Freq: Once | INTRAVENOUS | Status: AC
  Administered 2017-06-06: 40 mg via INTRAVENOUS
  Filled 2017-06-06: qty 40

## 2017-06-06 MED ORDER — DEXTROSE-NACL 5-0.45 % IV SOLN
INTRAVENOUS | Status: DC
  Administered 2017-06-07: 02:00:00 via INTRAVENOUS

## 2017-06-06 MED ORDER — MORPHINE SULFATE (PF) 4 MG/ML IV SOLN
2.0000 mg | INTRAVENOUS | Status: DC | PRN
  Administered 2017-06-06: 2 mg via INTRAVENOUS
  Filled 2017-06-06: qty 1

## 2017-06-06 MED ORDER — INSULIN REGULAR BOLUS VIA INFUSION
0.0000 [IU] | Freq: Three times a day (TID) | INTRAVENOUS | Status: DC
  Filled 2017-06-06: qty 10

## 2017-06-06 MED ORDER — TRAZODONE HCL 100 MG PO TABS
300.0000 mg | ORAL_TABLET | Freq: Every day | ORAL | Status: DC
  Administered 2017-06-06 – 2017-06-10 (×5): 300 mg via ORAL
  Filled 2017-06-06 (×5): qty 3

## 2017-06-06 MED ORDER — ENOXAPARIN SODIUM 40 MG/0.4ML ~~LOC~~ SOLN
40.0000 mg | SUBCUTANEOUS | Status: DC
  Administered 2017-06-07 – 2017-06-11 (×5): 40 mg via SUBCUTANEOUS
  Filled 2017-06-06 (×5): qty 0.4

## 2017-06-06 MED ORDER — ENSURE ENLIVE PO LIQD
237.0000 mL | Freq: Two times a day (BID) | ORAL | Status: DC
  Administered 2017-06-07: 237 mL via ORAL

## 2017-06-06 MED ORDER — VANCOMYCIN HCL IN DEXTROSE 1-5 GM/200ML-% IV SOLN
1000.0000 mg | Freq: Once | INTRAVENOUS | Status: AC
  Administered 2017-06-07: 1000 mg via INTRAVENOUS
  Filled 2017-06-06: qty 200

## 2017-06-06 MED ORDER — ONDANSETRON HCL 4 MG/2ML IJ SOLN
4.0000 mg | Freq: Four times a day (QID) | INTRAMUSCULAR | Status: DC | PRN
  Administered 2017-06-07 – 2017-06-09 (×4): 4 mg via INTRAVENOUS
  Filled 2017-06-06 (×4): qty 2

## 2017-06-06 MED ORDER — ACETAMINOPHEN 325 MG PO TABS
650.0000 mg | ORAL_TABLET | Freq: Four times a day (QID) | ORAL | Status: DC | PRN
  Administered 2017-06-06 – 2017-06-09 (×2): 650 mg via ORAL
  Filled 2017-06-06 (×2): qty 2

## 2017-06-06 MED ORDER — ONDANSETRON HCL 4 MG/2ML IJ SOLN
4.0000 mg | Freq: Once | INTRAMUSCULAR | Status: AC
  Administered 2017-06-06: 4 mg via INTRAVENOUS
  Filled 2017-06-06: qty 2

## 2017-06-06 MED ORDER — SODIUM CHLORIDE 0.9 % IV SOLN
INTRAVENOUS | Status: DC
  Administered 2017-06-06: 2.6 [IU]/h via INTRAVENOUS
  Administered 2017-06-06: via INTRAVENOUS

## 2017-06-06 MED ORDER — SODIUM CHLORIDE 0.9 % IV SOLN: INTRAVENOUS | Status: DC

## 2017-06-06 NOTE — ED Notes (Signed)
Pt also reports chest pain that started today when he breaths in

## 2017-06-06 NOTE — ED Triage Notes (Signed)
Pt brought to triage upon arrival by EMS, per EMS: pt here with c/o hyperglycemia for two days associated with n/v/d. CBG with EMS was 441. BH-135/91, HR-105, RR-18. 4mg  zofran ODT given en route

## 2017-06-06 NOTE — ED Provider Notes (Signed)
[facility] DEPT Provider Note   CSN: [id number] Arrival date & time: 06/06/17  1456     History   Chief Complaint Chief Complaint  Patient presents with  . Hyperglycemia    HPI [name] is a 31 y.o. male.  The history is provided by the patient.   32 year old male with a history of type 1 diabetes and prior DKA, polysubstance abuse, schizophrenia who was recently admitted for severe DKA and left against medical advise for social reasons presents again today for 3 days of nausea and vomiting. The patient reports that he has not been taking his insulin because she was prescribed Lantus and NovoLog which his insurance company won't pay for. He reports that he needs to be on Levemir. Patient also endorses some epigastric abdominal pain consistent with acid reflux. Denies any fevers, chills, diarrhea. Denies any urinary symptoms or respiratory symptoms. Denies any other physical complaints.  Past Medical History:  Diagnosis Date  . Arthritis    "both hips" (12/21/2014)  . Asthma    Childhood  . Chicken pox   . Chronic pain   . Depression   . Diabetes mellitus    Type 1 diabetes  . DKA (diabetic ketoacidoses) ([place]) 11/18/2014  . GERD (gastroesophageal reflux disease)   . Hypertension   . Noncompliance with medication regimen   . Polysubstance abuse   . Schizo affective schizophrenia ([place])   . Scoliosis     Patient Active Problem List   Diagnosis Date Noted  . DKA (diabetic ketoacidoses) ([place]) 06/03/2017  . Substance abuse 05/30/2016  . Substance induced mood disorder ([place]) 05/30/2016  . Tobacco use disorder 12/24/2015  . DM hyperosmolarity type I, uncontrolled ([place]) 12/19/2015  . Type I diabetes mellitus with hyperosmolarity ([place]) 12/19/2015  . Left shoulder pain 08/19/2015  . Generalized headache 07/22/2015  . Bilateral hip bursitis 05/26/2015  . Depression   . Generalized anxiety disorder 03/29/2015  . Undifferentiated schizophrenia ([place])   . Hip pain,  bilateral 11/18/2014  . Hyponatremia 06/17/2012  . Dehydration 06/17/2012    Past Surgical History:  Procedure Laterality Date  . HAND SURGERY Left    Left thumb surgery       Home Medications    Prior to Admission medications   Medication Sig Start Date End Date Taking? Authorizing Provider  blood glucose meter kit and supplies KIT Dispense based on patient and insurance preference. Use up to four times daily as directed. (FOR ICD-9 250.00, 250.01). 06/05/17   Domenic Polite, MD  insulin glargine (LANTUS) 100 UNIT/ML injection Inject 0.45 mLs (45 Units total) into the skin at bedtime. 06/05/17   Domenic Polite, MD  insulin lispro (HUMALOG) 100 UNIT/ML injection Inject 0-0.14 mLs (0-14 Units total) into the skin See admin instructions. Per sliding scale three times daily 06/05/17   Domenic Polite, MD  QUEtiapine (SEROQUEL) 300 MG tablet Take 1 tablet (300 mg total) by mouth at bedtime. 06/05/17   Domenic Polite, MD  traZODone (DESYREL) 150 MG tablet Take 150 mg by mouth at bedtime.    [provider]    Family History Family History  Problem Relation Age of Onset  . Diabetes Mother     Social History Social History  Substance Use Topics  . Smoking status: Current Every Day Smoker    Packs/day: 1.00    Years: 7.00    Types: Cigarettes  . Smokeless tobacco: Never Used  . Alcohol use No     Comment: 12/21/2014 "I drink on a rare  occasion"     Allergies   Sulfa antibiotics   Review of Systems Review of Systems All other systems are reviewed and are negative for acute change except as noted in the HPI   Physical Exam Updated Vital Signs BP (!) 131/91   Pulse (!) 103   Temp 98.9 F (37.2 C) (Oral)   Resp 19   SpO2 100%   Physical Exam  Constitutional: He is oriented to person, place, and time. He appears well-developed and well-nourished. No distress.  HENT:  Head: Normocephalic and atraumatic.  Nose: Nose normal.  Eyes: Conjunctivae and EOM are  normal. Pupils are equal, round, and reactive to light. Right eye exhibits no discharge. Left eye exhibits no discharge. No scleral icterus.  Neck: Normal range of motion. Neck supple.  Cardiovascular: Normal rate and regular rhythm.  Exam reveals no gallop and no friction rub.   No murmur heard. Pulmonary/Chest: Effort normal and breath sounds normal. No stridor. No respiratory distress. He has no rales.  Abdominal: Soft. He exhibits no distension. There is no tenderness.  Musculoskeletal: He exhibits no edema or tenderness.  Neurological: He is alert and oriented to person, place, and time.  Skin: Skin is warm and dry. No rash noted. He is not diaphoretic. No erythema.  Psychiatric: He has a normal mood and affect.  Vitals reviewed.    ED Treatments / Results  Labs (all labs ordered are listed, but only abnormal results are displayed) Labs Reviewed  BASIC METABOLIC PANEL - Abnormal; Notable for the following:       Result Value   Sodium 134 (*)    Chloride 99 (*)    CO2 18 (*)    Glucose, Bld 455 (*)    Creatinine, Ser 1.25 (*)    Anion gap 17 (*)    All other components within normal limits  CBC - Abnormal; Notable for the following:    RBC 4.20 (*)    Hemoglobin 12.2 (*)    HCT 34.6 (*)    All other components within normal limits  CBG MONITORING, ED - Abnormal; Notable for the following:    Glucose-Capillary 452 (*)    All other components within normal limits  URINALYSIS, ROUTINE W REFLEX MICROSCOPIC  I-STAT TROPOININ, ED    EKG  EKG Interpretation None       Radiology No results found.  Procedures Procedures (including critical care time)  Medications Ordered in ED Medications - No data to display   Initial Impression / Assessment and Plan / ED Course  I have reviewed the triage vital signs and the nursing notes.  Pertinent labs & imaging results that were available during my care of the patient were reviewed by me and considered in my medical  decision making (see chart for details).  Clinical Course as of Jun 08 39  Wed Jun 06, 2017  2015 Presentation consistent with mild DKA. Patient left AMA yesterday after being admitted for severe DKA. States that he left due to social reasons and has got those issues squared away.  Started on IV fluids and insulin drip. Will require admission to medicine for continued management.  [PC]    Clinical Course User Index [PC] Tanza Pellot, Grayce Sessions, MD      Final Clinical Impressions(s) / ED Diagnoses   Final diagnoses:  Chest pain  Diabetic ketoacidosis without coma associated with type 1 diabetes mellitus ([place])      Harshaan Whang, Grayce Sessions, MD 06/07/17 [id number]

## 2017-06-06 NOTE — Progress Notes (Signed)
Patient just arrived to unit temperature 100.4 degrees Farenheit orally.  Patient requesting something to eat/drink; has no diet ordered at this time.  RN paged R. Smith with this information via Amion.

## 2017-06-06 NOTE — H&P (Signed)
History and Physical    [name] EHO:[id number] DOB: [birth year] DOA: 06/06/2017  Referring MD/NP/PA: Dr. Leonette Monarch PCP: Nolene Ebbs, MD  Patient coming from: via EMS  Chief Complaint: Vomiting and chest pain  HPI: [name] is a 31 y.o. male with medical history significant of DM type I, HTN, GERD, schizophrenia, anxiety, depression, substance abuse, and chronic pain; who presents with complaints of nausea, vomiting, and chest pain. He was just recently admitted under the critical care service at [facility] 3 days ago. He left AMA as he needed to go secure his new living arrangement at the hotel. After leaving patient notes that he was unable to fill the prescriptions for Lantus or Novolag vials as these were not covered by his insurance. His insurance covers Levemir and NovoLog flex touch pens. Patient reports having left-sided chest pain that feels like pressure along with pain that radiates up into his throat since yesterday. The GI cocktail helped relieve the symptoms of chest pain radiating up into his throat, but the left-sided chest discomfort persisted. Patient also complained of persistent nausea and vomiting and states that the emesis was red in color. He thinks that it is blood as he has not had anything red to drink or eat.  He currently reports being unable to keep any food or liquid down. Patient denies any NSAID use, dysuria, lightheadedness, loss of consciousness, focal weakness, ordrug use. He was not able to follow-up with his primary care provider as the appointment is not set up until next month.  ED Course: Upon admission into the emergency department patient was seen to be afebrile heart rates 97-105, respirations 15-22, and all the vital signs relatively maintained. Labs revealed hemoglobin 12.2, sodium 134, potassium 4.5, chloride 99, CO2 18, BUN 12, creatinine 1.25, glucose 455, anion gap 17, and venous pH 7.303. Urinalysis was negative for any signs of  infection. Patient was given Protonix 40 mg IV 1 dose started on glucose stabilizer protocol and TRH called to admit.   Review of Systems: As per HPI otherwise 10 point review of systems negative.   Past Medical History:  Diagnosis Date  . Arthritis    "both hips" (12/21/2014)  . Asthma    Childhood  . Chicken pox   . Chronic pain   . Depression   . Diabetes mellitus    Type 1 diabetes  . DKA (diabetic ketoacidoses) ([place]) 11/18/2014  . GERD (gastroesophageal reflux disease)   . Hypertension   . Noncompliance with medication regimen   . Polysubstance abuse   . Schizo affective schizophrenia ([place])   . Scoliosis     Past Surgical History:  Procedure Laterality Date  . HAND SURGERY Left    Left thumb surgery     reports that he has been smoking Cigarettes.  He has a 7.00 pack-year smoking history. He has never used smokeless tobacco. He reports that he uses drugs, including Marijuana, Cocaine, and Methamphetamines. He reports that he does not drink alcohol.  Allergies  Allergen Reactions  . Sulfa Antibiotics Other (See Comments)    Unknown - Childhood    Family History  Problem Relation Age of Onset  . Diabetes Mother     Prior to Admission medications   Medication Sig Start Date End Date Taking? Authorizing Provider  blood glucose meter kit and supplies KIT Dispense based on patient and insurance preference. Use up to four times daily as directed. (FOR ICD-9 250.00, 250.01). 06/05/17   Domenic Polite, MD  insulin  glargine (LANTUS) 100 UNIT/ML injection Inject 0.45 mLs (45 Units total) into the skin at bedtime. 06/05/17   Domenic Polite, MD  insulin lispro (HUMALOG) 100 UNIT/ML injection Inject 0-0.14 mLs (0-14 Units total) into the skin See admin instructions. Per sliding scale three times daily 06/05/17   Domenic Polite, MD  QUEtiapine (SEROQUEL) 300 MG tablet Take 1 tablet (300 mg total) by mouth at bedtime. 06/05/17   Domenic Polite, MD  traZODone (DESYREL) 150 MG  tablet Take 150 mg by mouth at bedtime.    [provider]    Physical Exam:   Constitutional: Young male appears acutely sick and in discomfort. Vitals:   06/06/17 1815 06/06/17 1830 06/06/17 1845 06/06/17 1900  BP: (!) 131/91 (!) 139/95 (!) 136/94 (!) 129/94  Pulse: (!) 103 (!) 104 (!) 102 (!) 101  Resp: _0 Temp:      TempSrc:      SpO2: 100% 100% 100% 100%   Eyes: PERRL, lids and conjunctivae normal ENMT: Mucous membranes are dry. Posterior pharynx clear of any exudate or lesions. Normal dentition.  Neck: normal, supple, no masses, no thyromegaly Respiratory: clear to auscultation bilaterally, no wheezing, no crackles. Normal respiratory effort. No accessory muscle use.  Cardiovascular: Tachycardic, no murmurs / rubs / gallops. No extremity edema. 2+ pedal pulses. No carotid bruits.  Abdomen: no tenderness, no masses palpated. No hepatosplenomegaly. Bowel sounds positive.  Musculoskeletal: no clubbing / cyanosis. No joint deformity upper and lower extremities. Good ROM, no contractures. Normal muscle tone.  Skin: no rashes, lesions, ulcers. No induration Neurologic: CN 2-12 grossly intact. Sensation intact, DTR normal. Strength 5/5 in all 4.  Psychiatric: Normal judgment and insight. Alert and oriented x 3. Normal mood.     Labs on Admission: I have personally reviewed following labs and imaging studies  CBC:  Recent Labs Lab 06/03/17 0100 06/03/17 0405 06/05/17 0531 06/06/17 1503  WBC 35.4* 28.9* 7.1 8.1  HGB 12.0* 12.0* 10.8* 12.2*  HCT 38.6* 36.3* 30.9* 34.6*  MCV 94.1 87.5 82.6 82.4  PLT 454* 375 202 993   Basic Metabolic Panel:  Recent Labs Lab 06/03/17 0405  06/04/17 0408 06/04/17 0854 06/04/17 1129 06/05/17 0531 06/06/17 1503  NA  --   < > 138 137 137 138 134*  K  --   < > 3.0* 2.8* 2.9* 3.5 4.5  CL  --   < > 99* 98* 100* 102 99*  CO2  --   < > _1 18*  GLUCOSE  --   < > 170* 161* 139* 356* 455*  BUN  --   < > _2 CREATININE  --   < > 1.17 1.12 0.95 1.28* 1.25*  CALCIUM  --   < > 7.4* 7.6* 7.6* 8.4* 9.2  MG 2.7*  --   --   --   --  2.7*  --   PHOS 9.7*  --   --   --   --  1.6*  --   < > = values in this interval not displayed. GFR: Estimated Creatinine Clearance: 95.9 mL/min (A) (by C-G formula based on SCr of 1.25 mg/dL (H)). Liver Function Tests:  Recent Labs Lab 06/03/17 0404  AST 15  ALT 15*  ALKPHOS 131*  BILITOT 2.2*  PROT 5.4*  ALBUMIN 3.3*   No results for input(s): LIPASE, AMYLASE in the last 168 hours. No results for input(s): AMMONIA in the last 168 hours.  Coagulation Profile: No results for input(s): INR, PROTIME in the last 168 hours. Cardiac Enzymes: No results for input(s): CKTOTAL, CKMB, CKMBINDEX, TROPONINI in the last 168 hours. BNP (last 3 results) No results for input(s): PROBNP in the last 8760 hours. HbA1C: No results for input(s): HGBA1C in the last 72 hours. CBG:  Recent Labs Lab 06/04/17 1549 06/04/17 2147 06/05/17 0802 06/06/17 1526 06/06/17 1859  GLUCAP 284* 334* 345* 452* 327*   Lipid Profile: No results for input(s): CHOL, HDL, LDLCALC, TRIG, CHOLHDL, LDLDIRECT in the last 72 hours. Thyroid Function Tests: No results for input(s): TSH, T4TOTAL, FREET4, T3FREE, THYROIDAB in the last 72 hours. Anemia Panel: No results for input(s): VITAMINB12, FOLATE, FERRITIN, TIBC, IRON, RETICCTPCT in the last 72 hours. Urine analysis:    Component Value Date/Time   COLORURINE STRAW (A) 06/06/2017 1914   APPEARANCEUR CLEAR 06/06/2017 1914   LABSPEC 1.029 06/06/2017 1914   PHURINE 6.0 06/06/2017 1914   GLUCOSEU >=500 (A) 06/06/2017 1914   HGBUR SMALL (A) 06/06/2017 1914   BILIRUBINUR NEGATIVE 06/06/2017 1914   BILIRUBINUR negative 03/29/2015 1114   KETONESUR 80 (A) 06/06/2017 1914   PROTEINUR NEGATIVE 06/06/2017 1914   UROBILINOGEN 0.2 05/17/2015 1810   NITRITE NEGATIVE 06/06/2017 1914   LEUKOCYTESUR NEGATIVE 06/06/2017 1914   Sepsis  Labs: Recent Results (from the past 240 hour(s))  MRSA PCR Screening     Status: None   Collection Time: 06/03/17  8:17 AM  Result Value Ref Range Status   MRSA by PCR NEGATIVE NEGATIVE Final    Comment:        The GeneXpert MRSA Assay (FDA approved for NASAL specimens only), is one component of a comprehensive MRSA colonization surveillance program. It is not intended to diagnose MRSA infection nor to guide or monitor treatment for MRSA infections.   Culture, blood (routine x 2)     Status: None (Preliminary result)   Collection Time: 06/04/17 11:29 AM  Result Value Ref Range Status   Specimen Description BLOOD LEFT ANTECUBITAL  Final   Special Requests IN PEDIATRIC BOTTLE Blood Culture adequate volume  Final   Culture NO GROWTH 2 DAYS  Final   Report Status PENDING  Incomplete  Culture, blood (routine x 2)     Status: None (Preliminary result)   Collection Time: 06/04/17 11:33 AM  Result Value Ref Range Status   Specimen Description BLOOD LEFT HAND  Final   Special Requests IN PEDIATRIC BOTTLE Blood Culture adequate volume  Final   Culture NO GROWTH 2 DAYS  Final   Report Status PENDING  Incomplete  Culture, group A strep     Status: None (Preliminary result)   Collection Time: 06/04/17  2:43 PM  Result Value Ref Range Status   Specimen Description THROAT  Final   Special Requests Normal  Final   Culture CULTURE REINCUBATED FOR BETTER GROWTH  Final   Report Status PENDING  Incomplete     Radiological Exams on Admission: Dg Chest 2 View  Result Date: 06/06/2017 CLINICAL DATA:  31 year old male with chest pain. EXAM: CHEST  2 VIEW COMPARISON:  None. FINDINGS: The heart size and mediastinal contours are within normal limits. Both lungs are clear. The visualized skeletal structures are unremarkable. IMPRESSION: No active cardiopulmonary disease. Electronically Signed   By: Anner Crete M.D.   On: 06/06/2017 19:00    EKG: Independently reviewed.Sinus tachycardia 107  bpm  Assessment/Plan DKA type I: Acute. Initial blood glucose of 455 with anion gap of 17. Patient just recently  was admitted to the ICU for severe DKA. He was unable to get any of his medications due to lack of coverage from his insurance. - Admit to stepdown unit  - Glucose stabilizer protocol initiated  - Serial BMPs x3 - Correct electrolytes as needed - advance diet as tolerated - Monitoring for AG closure and will transition to subcutaneous insulin once able - Patient needs prescriptions  for Levemir and NovoLog flex touch as this is covered by his insurance - Question if diabetic testing supplies are covered  SIRS: Patient noted to have fever 100.4 F and tachycardia. Previous admission blood and strep culture have shown no growth. Unclear source of infection at this time his UA was negative for signs of infection and chest x-ray. Clear - recheck blood cultures and a rapid strep screen - Empiric antibiotics of Zosyn and Vancomycin  Chest pain: Acute. Patient complaining of left-sided chest pain that radiates up into his neck could possibly be secondary to GERD. - Trend cardiac enzymes and check UDS - Morphine prn severe pain - GI cocktail prn indigestion  Possible hematemesis: Patient reports vomiting up red. Question  secondary to gastritis. Hemoglobin appears stable. Patient given Protonix IV 1 dose in the emergency department. - Clear liquid diet - Protonix - May need consult to gastroenterology, if symptoms persist  Normocytic normochromic Anemia: Patient is on hemoglobin previously been 10.8, but he presents with a hemoglobin of 12.2 on admission. Vital signs otherwise stable. - Continue to monitor H&H  Anxiety, depression, and schizophrenia: Currently appears stable. - Continue Doxepin started during last hospitalization  GERD - Protonix   Insomnia - Continue trazodone   Tobacco abuse:  - Nicotine patch   DVT prophylaxis: Lovenox Code Status: Full Family  Communication: no family present at bedside  Disposition Plan: Likely discharge, 1-2 days  Consults called: None Admission status: Observation  Norval Morton MD [facility] Pager [phone]  If 7PM-7AM, please contact night-coverage [email or web address] Password [facility]  06/06/2017, 7:39 PM

## 2017-06-06 NOTE — ED Notes (Signed)
Patient transported to X-ray 

## 2017-06-06 NOTE — Progress Notes (Signed)
Pharmacy Antibiotic Note  [name] is a 31 y.o. male admitted on 06/06/2017 with sepsis.  Pharmacy has been consulted for Vancocin and Zosyn dosing.  Plan: Vancomycin 1000mg  IV every 8 hours.  Goal trough 15-20 mcg/mL. Zosyn 3.375g IV q8h (4 hour infusion).  Height: 6\' 1"  (185.4 cm) Weight: 189 lb 11.2 oz (86 kg) IBW/kg (Calculated) : 79.9  Temp (24hrs), Avg:99.7 F (37.6 C), Min:98.9 F (37.2 C), Max:100.4 F (38 C)   Recent Labs Lab 06/03/17 0100  06/03/17 0405  06/04/17 0408 06/04/17 0854 06/04/17 1129 06/05/17 0531 06/06/17 1503  WBC 35.4*  --  28.9*  --   --   --   --  7.1 8.1  CREATININE  --   < >  --   < > 1.17 1.12 0.95 1.28* 1.25*  < > = values in this interval not displayed.  Estimated Creatinine Clearance: 95.9 mL/min (A) (by C-G formula based on SCr of 1.25 mg/dL (H)).    Allergies  Allergen Reactions  . Sulfa Antibiotics Other (See Comments)    Unknown - Childhood     Thank you for allowing pharmacy to be a part of this patient's care.  06/07/17, [name], BCPS  06/06/2017 11:04 PM

## 2017-06-06 NOTE — ED Notes (Signed)
EDP stated pt could have something to drink, pt given PO fluids and tolerating them well.

## 2017-06-07 ENCOUNTER — Observation Stay (HOSPITAL_BASED_OUTPATIENT_CLINIC_OR_DEPARTMENT_OTHER): Payer: Medicare Other

## 2017-06-07 DIAGNOSIS — F17200 Nicotine dependence, unspecified, uncomplicated: Secondary | ICD-10-CM | POA: Diagnosis not present

## 2017-06-07 DIAGNOSIS — Z79899 Other long term (current) drug therapy: Secondary | ICD-10-CM | POA: Diagnosis not present

## 2017-06-07 DIAGNOSIS — Z882 Allergy status to sulfonamides status: Secondary | ICD-10-CM | POA: Diagnosis not present

## 2017-06-07 DIAGNOSIS — E1069 Type 1 diabetes mellitus with other specified complication: Secondary | ICD-10-CM | POA: Diagnosis not present

## 2017-06-07 DIAGNOSIS — F17210 Nicotine dependence, cigarettes, uncomplicated: Secondary | ICD-10-CM | POA: Diagnosis present

## 2017-06-07 DIAGNOSIS — A419 Sepsis, unspecified organism: Secondary | ICD-10-CM | POA: Diagnosis present

## 2017-06-07 DIAGNOSIS — R079 Chest pain, unspecified: Secondary | ICD-10-CM

## 2017-06-07 DIAGNOSIS — R112 Nausea with vomiting, unspecified: Secondary | ICD-10-CM | POA: Diagnosis present

## 2017-06-07 DIAGNOSIS — R6510 Systemic inflammatory response syndrome (SIRS) of non-infectious origin without acute organ dysfunction: Secondary | ICD-10-CM

## 2017-06-07 DIAGNOSIS — Z9119 Patient's noncompliance with other medical treatment and regimen: Secondary | ICD-10-CM | POA: Diagnosis not present

## 2017-06-07 DIAGNOSIS — E876 Hypokalemia: Secondary | ICD-10-CM | POA: Diagnosis present

## 2017-06-07 DIAGNOSIS — E1010 Type 1 diabetes mellitus with ketoacidosis without coma: Secondary | ICD-10-CM | POA: Diagnosis present

## 2017-06-07 DIAGNOSIS — K219 Gastro-esophageal reflux disease without esophagitis: Secondary | ICD-10-CM | POA: Diagnosis present

## 2017-06-07 DIAGNOSIS — N179 Acute kidney failure, unspecified: Secondary | ICD-10-CM | POA: Diagnosis present

## 2017-06-07 DIAGNOSIS — F329 Major depressive disorder, single episode, unspecified: Secondary | ICD-10-CM | POA: Diagnosis present

## 2017-06-07 LAB — BASIC METABOLIC PANEL
Anion gap: 8 (ref 5–15)
Anion gap: 7 (ref 5–15)
BUN: 9 mg/dL (ref 6–20)
BUN: 7 mg/dL (ref 6–20)
CHLORIDE: 106 mmol/L (ref 101–111)
CHLORIDE: 108 mmol/L (ref 101–111)
CO2: 24 mmol/L (ref 22–32)
CO2: 27 mmol/L (ref 22–32)
CREATININE: 0.72 mg/dL (ref 0.61–1.24)
Calcium: 8.3 mg/dL — ABNORMAL LOW (ref 8.9–10.3)
Calcium: 8.5 mg/dL — ABNORMAL LOW (ref 8.9–10.3)
Creatinine, Ser: 0.7 mg/dL (ref 0.61–1.24)
GFR calc Af Amer: >60 mL/min (ref >60)
Glucose, Bld: 222 mg/dL — ABNORMAL HIGH (ref 65–99)
Glucose, Bld: 126 mg/dL — ABNORMAL HIGH (ref 65–99)
POTASSIUM: 3 mmol/L — AB (ref 3.5–5.1)
POTASSIUM: 3 mmol/L — AB (ref 3.5–5.1)
SODIUM: 138 mmol/L (ref 135–145)
SODIUM: 142 mmol/L (ref 135–145)

## 2017-06-07 LAB — CBC
HCT: 31.3 % — ABNORMAL LOW (ref 39.0–52.0)
Hemoglobin: 10.6 g/dL — ABNORMAL LOW (ref 13.0–17.0)
MCH: 27.7 pg (ref 26.0–34.0)
MCHC: 33.9 g/dL (ref 30.0–36.0)
MCV: 81.7 fL (ref 78.0–100.0)
Platelets: 203 10*3/uL (ref 150–400)
RBC: 3.83 MIL/uL — AB (ref 4.22–5.81)
RDW: 11.7 % (ref 11.5–15.5)
WBC: 10.8 10*3/uL — ABNORMAL HIGH (ref 4.0–10.5)

## 2017-06-07 LAB — HEMOGLOBIN AND HEMATOCRIT, BLOOD
HCT: 32.7 % — ABNORMAL LOW (ref 39.0–52.0)
Hemoglobin: 11.3 g/dL — ABNORMAL LOW (ref 13.0–17.0)

## 2017-06-07 LAB — MRSA PCR SCREENING: MRSA by PCR: NEGATIVE

## 2017-06-07 LAB — ECHOCARDIOGRAM COMPLETE
HEIGHTINCHES: 73 in
WEIGHTICAEL: 3067.2 [oz_av]

## 2017-06-07 LAB — GLUCOSE, CAPILLARY
GLUCOSE-CAPILLARY: 224 mg/dL — AB (ref 65–99)
GLUCOSE-CAPILLARY: 173 mg/dL — AB (ref 65–99)
GLUCOSE-CAPILLARY: 166 mg/dL — AB (ref 65–99)
GLUCOSE-CAPILLARY: 111 mg/dL — AB (ref 65–99)
GLUCOSE-CAPILLARY: 126 mg/dL — AB (ref 65–99)
Glucose-Capillary: 143 mg/dL — ABNORMAL HIGH (ref 65–99)
Glucose-Capillary: 150 mg/dL — ABNORMAL HIGH (ref 65–99)
Glucose-Capillary: 160 mg/dL — ABNORMAL HIGH (ref 65–99)
Glucose-Capillary: 177 mg/dL — ABNORMAL HIGH (ref 65–99)
Glucose-Capillary: 228 mg/dL — ABNORMAL HIGH (ref 65–99)
Glucose-Capillary: 244 mg/dL — ABNORMAL HIGH (ref 65–99)

## 2017-06-07 LAB — TROPONIN I
TROPONIN I: 0.03 ng/mL — AB (ref <0.03)
Troponin I: 0.04 ng/mL (ref <0.03)

## 2017-06-07 LAB — CULTURE, GROUP A STREP (THRC): Special Requests: NORMAL

## 2017-06-07 LAB — LACTIC ACID, PLASMA
Lactic Acid, Venous: 0.7 mmol/L (ref 0.5–1.9)
Lactic Acid, Venous: 1 mmol/L (ref 0.5–1.9)

## 2017-06-07 LAB — RAPID STREP SCREEN (MED CTR MEBANE ONLY): Streptococcus, Group A Screen (Direct): NEGATIVE

## 2017-06-07 MED ORDER — INSULIN DETEMIR 100 UNIT/ML ~~LOC~~ SOLN: 10.0000 [IU] | Freq: Every day | SUBCUTANEOUS | Status: DC

## 2017-06-07 MED ORDER — POTASSIUM CHLORIDE 10 MEQ/100ML IV SOLN
10.0000 meq | INTRAVENOUS | Status: AC
  Administered 2017-06-07 (×2): 10 meq via INTRAVENOUS
  Filled 2017-06-07 (×2): qty 100

## 2017-06-07 MED ORDER — DOXEPIN HCL 10 MG/ML PO CONC
100.0000 mg | Freq: Every day | ORAL | Status: DC
Start: 2017-06-07 — End: 2017-06-11
  Administered 2017-06-07 – 2017-06-10 (×5): 100 mg via ORAL
  Filled 2017-06-07 (×6): qty 10

## 2017-06-07 MED ORDER — MORPHINE SULFATE (PF) 4 MG/ML IV SOLN
2.0000 mg | INTRAVENOUS | Status: DC | PRN
  Administered 2017-06-07 – 2017-06-08 (×9): 2 mg via INTRAVENOUS
  Filled 2017-06-07 (×9): qty 1

## 2017-06-07 MED ORDER — INSULIN ASPART 100 UNIT/ML ~~LOC~~ SOLN
0.0000 [IU] | Freq: Three times a day (TID) | SUBCUTANEOUS | Status: DC
  Administered 2017-06-07: 3 [IU] via SUBCUTANEOUS
  Administered 2017-06-07: 2 [IU] via SUBCUTANEOUS
  Administered 2017-06-08: 5 [IU] via SUBCUTANEOUS
  Administered 2017-06-08: 15 [IU] via SUBCUTANEOUS
  Administered 2017-06-09: 5 [IU] via SUBCUTANEOUS
  Administered 2017-06-09: 3 [IU] via SUBCUTANEOUS
  Administered 2017-06-09 – 2017-06-10 (×2): 5 [IU] via SUBCUTANEOUS
  Administered 2017-06-10: 11 [IU] via SUBCUTANEOUS
  Administered 2017-06-10 – 2017-06-11 (×3): 8 [IU] via SUBCUTANEOUS

## 2017-06-07 MED ORDER — INSULIN DETEMIR 100 UNIT/ML ~~LOC~~ SOLN
40.0000 [IU] | Freq: Every day | SUBCUTANEOUS | Status: DC
  Administered 2017-06-07 – 2017-06-08 (×2): 40 [IU] via SUBCUTANEOUS
  Filled 2017-06-07 (×2): qty 0.4

## 2017-06-07 MED ORDER — INSULIN ASPART 100 UNIT/ML ~~LOC~~ SOLN
0.0000 [IU] | Freq: Every day | SUBCUTANEOUS | Status: DC
  Administered 2017-06-07: 3 [IU] via SUBCUTANEOUS

## 2017-06-07 MED ORDER — TRAMADOL HCL 50 MG PO TABS
100.0000 mg | ORAL_TABLET | Freq: Four times a day (QID) | ORAL | Status: DC | PRN
  Administered 2017-06-07 – 2017-06-11 (×7): 100 mg via ORAL
  Filled 2017-06-07 (×7): qty 2

## 2017-06-07 MED ORDER — GLUCERNA SHAKE PO LIQD
237.0000 mL | Freq: Three times a day (TID) | ORAL | Status: DC
  Administered 2017-06-07 – 2017-06-11 (×13): 237 mL via ORAL

## 2017-06-07 MED ORDER — POTASSIUM CHLORIDE CRYS ER 20 MEQ PO TBCR
20.0000 meq | EXTENDED_RELEASE_TABLET | ORAL | Status: AC
  Filled 2017-06-07: qty 1

## 2017-06-07 MED ORDER — POTASSIUM CHLORIDE CRYS ER 20 MEQ PO TBCR
40.0000 meq | EXTENDED_RELEASE_TABLET | Freq: Once | ORAL | Status: AC
  Administered 2017-06-07: 40 meq via ORAL
  Filled 2017-06-07: qty 2

## 2017-06-07 NOTE — Progress Notes (Signed)
Inpatient Diabetes Program Recommendations  AACE/ADA: New Consensus Statement on Inpatient Glycemic Control (2015)  Target Ranges:  Prepandial:   less than 140 mg/dL      Peak postprandial:   less than 180 mg/dL (1-2 hours)      Critically ill patients:  140 - 180 mg/dL   Lab Results  Component Value Date   GLUCAP 111 (H) 06/07/2017   HGBA1C 13.9 (H) 01/22/2016    Review of Glycemic Control  Diabetes history: DM1 Outpatient Diabetes medications: Lantus 45 units hs + Humalog 10 units tid meal coverage (D/C'd previously on 70/30 48 units bid on 12/27/15 Current orders for Inpatient glycemic control: Levemir 40 units + Novolog correction 0-15 units tid + 0-5 units hs  Inpatient Diabetes Program Recommendations:    Patient will need Novolog meal coverage added tid if eats >50%. Spoke with patient @ bedside regarding insulin mgt along with explaining need as a type 1 that he cannot go without his insulin. Patient verbalizes understanding. Patient keeps his eyes shut during conversation and states he is in pain. Patient states that he has someone that will call [facility] where he gets his medications to check on price of Levemir. Reviewed with patient that he will also need to take short acting for meal coverage tid ac meals and he acknowledges understanding. Explained 70/30 to him also that he has been prescribed in the past but patient states "I don't know". Explained to patient that 70/30 Novolin insulin is available @ [facility] for approx. $25 per vial and his doctor will need to tell him how much to transition to. Will follow. I saw this patient on 06/04/17 on last admission.  Thank you, Billy Fischer. Tenoch Mcclure, RN, MSN, CDE  Diabetes Coordinator Inpatient Glycemic Control Team Team Pager [phone] (8am-5pm) 06/07/2017 10:08 AM

## 2017-06-07 NOTE — Progress Notes (Signed)
CSW was consulted for transportation resources for patient. CSW met with patient at bedside. Patient indicated he is able to use public transportation, but recently had some difficulty due to losing his eyeglasses. Patient plans to get new glasses after leaving hospital. CSW assessed patient's living situation due to reports from nursing staff that patient is staying in hotel. Patient reported he had paid for a room at a hotel for the next month. Patient recently came here from [place] and plans to remain in [place]. CSW provided patient with housing and other community resources. CSW signing off.  Estanislado Emms, [place]

## 2017-06-07 NOTE — Progress Notes (Signed)
Patient complaining of 7/10 left chest pressure.  EKG completed, last Troponin resulted at 0.03.  Patient does have morphine 2mg  q4h PRN ordered, last dose received at 2222.  RN paged [phone]. [phone] MD with [facility] who saw patient in emergency department with this information.

## 2017-06-07 NOTE — Progress Notes (Signed)
  Echocardiogram 2D Echocardiogram has been performed.  [name] 06/07/2017, 11:46 AM

## 2017-06-07 NOTE — Progress Notes (Signed)
Initial Nutrition Assessment  DOCUMENTATION CODES:   Not applicable  INTERVENTION:   Glucerna Shake po TID, each supplement provides 220 kcal and 10 grams of protein  NUTRITION DIAGNOSIS:   Inadequate oral intake related to poor appetite as evidenced by per patient/family report.  GOAL:   Patient will meet greater than or equal to 90% of their needs  MONITOR:   PO intake, Supplement acceptance  REASON FOR ASSESSMENT:   Malnutrition Screening Tool    ASSESSMENT:   31 yo male with PMH of DM1, polysubstance abuse, chronic pain, noncompliance, GERD, HTN, DKA, schizophrenia who was admitted on 6/20 with chest pain, nausea, and vomiting related to DKA.  Patient reports poor intake recently. For the past few days he has had a poor appetite related to chest pain. Lunch tray on bedside table, untouched. He has had an Ensure supplement and tolerated it well. Will change supplement to Glucerna Shake. 4.5% weight loss within the past month is not significant. Labs and medications reviewed. CBG's: [phone] Insulin drip was discontinued this morning.  Diet Order:  Diet Carb Modified Fluid consistency: Thin; Room service appropriate? Yes  Skin:  Reviewed, no issues  Last BM:  6/20  Height:   Ht Readings from Last 1 Encounters:  06/06/17 6\' 1"  (1.854 m)    Weight:   Wt Readings from Last 1 Encounters:  06/07/17 191 lb 11.2 oz (87 kg)    Ideal Body Weight:  83.6 kg  BMI:  Body mass index is 25.29 kg/m.  Estimated Nutritional Needs:   Kcal:  2300-2500  Protein:  100-115 gm  Fluid:  2.3-2.5 L  EDUCATION NEEDS:   Education needs addressed, patient has no questions regarding his diet and declined diet education.   06/09/17, RD, LDN, CNSC Pager [phone] After Hours Pager [phone]

## 2017-06-07 NOTE — Progress Notes (Signed)
  2D Echocardiogram has been performed.  [name] 06/07/2017, 11:46 AM

## 2017-06-07 NOTE — Progress Notes (Signed)
Patients last three blood sugars have been below 180.  On last drawn basic metabolic panel patients anion gap was 8 and CO2 was 24.  Patient continues on glucostabilizer at this time.  RN text paged this information to [facility].

## 2017-06-07 NOTE — Progress Notes (Signed)
RN text paged [facility] due to patients potassium being 3.0.  Order entered per on call for [facility].

## 2017-06-07 NOTE — Progress Notes (Signed)
PROGRESS NOTE    [name]  KZS:[id number] DOB: 08-Jun-1986 DOA: 06/06/2017 PCP: Fleet Contras, MD    Brief Narrative: 31 y.o. male with medical history significant of DM type I, HTN, GERD, schizophrenia, anxiety, depression, substance abuse, and chronic pain; who presents with complaints of nausea, vomiting, and chest pain. He was just recently admitted under the critical care service at [facility] 3 days ago. He left AMA as he needed to go secure his new living arrangement at the hotel. After leaving patient notes that he was unable to fill the prescriptions for Lantus or Novolag vials as these were not covered by his insurance. He was admitted for recurrent symptoms of nausea and vomiting, chest pressure. .  Assessment & Plan:   Principal Problem:   DKA, type 1 ([name]) Active Problems:   Depression   Tobacco use disorder   SIRS (systemic inflammatory response syndrome) ([name])   Chest pain   GERD (gastroesophageal reflux disease)   DKA:  Non compliance to insulin.  His gap is closed. cbg's are well controlled.  Bicarb is normal.    Hypokalemia: replaced. Pt refusing po potassium.    Acute renal failure:   - possibly dehydration and DKA.  - creatinine normalized with hydration.    Mild elevation of the troponins:  Un clear etiology.  Echocardiogram ordered.  EKG is normal sinus rhythm.     Fever , chills, leukocytosis. Londell Moh ua is negative. CXR is negative.  Blood cultures ordered and have been negative.  He was empirically started on IV vancomycin and zosyn.   Depression: no suicidal ideations.   Tobacco use:  Nicotine patch used.      DVT prophylaxis: (Lovenox) Code Status: full code.  Family Communication: none at bedside. Discussed the plan of care with the patient.  Disposition Plan: pending further evaluation.    Consultants:   None.    Procedures: none.    Antimicrobials: none.    Subjective: Reports occasional chest pain on deep  breathing, no sob.  Nauseated earlier today.  No vomiting.  abd pain.  No diarrhea.   Objective: Vitals:   06/07/17 0400 06/07/17 0612 06/07/17 0728 06/07/17 0733  BP: 117/81  118/87   Pulse:      Resp: 11  10   Temp: 97.9 F (36.6 C)   97.6 F (36.4 C)  TempSrc: Oral     SpO2: 98%  99%   Weight:  87 kg (191 lb 11.2 oz)    Height:        Intake/Output Summary (Last 24 hours) at 06/07/17 1213 Last data filed at 06/07/17 0900  Gross per 24 hour  Intake          1269.62 ml  Output              900 ml  Net           369.62 ml   Filed Weights   06/06/17 2249 06/07/17 0612  Weight: 86 kg (189 lb 11.2 oz) 87 kg (191 lb 11.2 oz)    Examination:  General exam: Appears calm and comfortable  Respiratory system: Clear to auscultation. Respiratory effort normal. Cardiovascular system: S1 & S2 heard, RRR. No JVD, murmurs, rubs, gallops or clicks. No pedal edema. Gastrointestinal system: Abdomen is nondistended, soft and nontender. No organomegaly or masses felt. Normal bowel sounds heard. Central nervous system: Alert and oriented. No focal neurological deficits. Extremities: Symmetric 5 x 5 power. Skin: No rashes, lesions or ulcers Psychiatry: Judgement  and insight appear normal. Mood & affect appropriate.     Data Reviewed: I have personally reviewed following labs and imaging studies  CBC:  Recent Labs Lab 06/03/17 0100 06/03/17 0405 06/05/17 0531 06/06/17 1503 06/06/17 2312 06/07/17 0856  WBC 35.4* 28.9* 7.1 8.1  --  10.8*  HGB 12.0* 12.0* 10.8* 12.2* 11.3* 10.6*  HCT 38.6* 36.3* 30.9* 34.6* 32.7* 31.3*  MCV 94.1 87.5 82.6 82.4  --  81.7  PLT 454* 375 202 246  --  203   Basic Metabolic Panel:  Recent Labs Lab 06/03/17 0405  06/05/17 0531 06/06/17 1503 06/06/17 2219 06/07/17 0226 06/07/17 0757  NA  --   < > 138 134* 138 138 142  K  --   < > 3.5 4.5 3.8 3.0* 3.0*  CL  --   < > 102 99* 102 106 108  CO2  --   < > 22 18* 22 24 27   GLUCOSE  --   < > 356*  455* 300* 222* 126*  BUN  --   < > 13 12 11 9 7   CREATININE  --   < > 1.28* 1.25* 1.08 0.72 0.70  CALCIUM  --   < > 8.4* 9.2 9.1 8.3* 8.5*  MG 2.7*  --  2.7*  --   --   --   --   PHOS 9.7*  --  1.6*  --   --   --   --   < > = values in this interval not displayed. GFR: Estimated Creatinine Clearance: 149.8 mL/min (by C-G formula based on SCr of 0.7 mg/dL). Liver Function Tests:  Recent Labs Lab 06/03/17 0404  AST 15  ALT 15*  ALKPHOS 131*  BILITOT 2.2*  PROT 5.4*  ALBUMIN 3.3*   No results for input(s): LIPASE, AMYLASE in the last 168 hours. No results for input(s): AMMONIA in the last 168 hours. Coagulation Profile: No results for input(s): INR, PROTIME in the last 168 hours. Cardiac Enzymes:  Recent Labs Lab 06/06/17 2219 06/07/17 0226 06/07/17 0757  TROPONINI 0.03* 0.03* 0.04*   BNP (last 3 results) No results for input(s): PROBNP in the last 8760 hours. HbA1C: No results for input(s): HGBA1C in the last 72 hours. CBG:  Recent Labs Lab 06/07/17 0500 06/07/17 0610 06/07/17 0713 06/07/17 0823 06/07/17 1156  GLUCAP 150* 177* 166* 111* 126*   Lipid Profile: No results for input(s): CHOL, HDL, LDLCALC, TRIG, CHOLHDL, LDLDIRECT in the last 72 hours. Thyroid Function Tests: No results for input(s): TSH, T4TOTAL, FREET4, T3FREE, THYROIDAB in the last 72 hours. Anemia Panel: No results for input(s): VITAMINB12, FOLATE, FERRITIN, TIBC, IRON, RETICCTPCT in the last 72 hours. Sepsis Labs:  Recent Labs Lab 06/04/17 1516 06/07/17 0009 06/07/17 0221  PROCALCITON 0.88  --   --   LATICACIDVEN  --  1.0 0.7    Recent Results (from the past 240 hour(s))  MRSA PCR Screening     Status: None   Collection Time: 06/03/17  8:17 AM  Result Value Ref Range Status   MRSA by PCR NEGATIVE NEGATIVE Final    Comment:        The GeneXpert MRSA Assay (FDA approved for NASAL specimens only), is one component of a comprehensive MRSA colonization surveillance program. It is  not intended to diagnose MRSA infection nor to guide or monitor treatment for MRSA infections.   Culture, blood (routine x 2)     Status: None (Preliminary result)   Collection Time: 06/04/17 11:29 AM  Result Value Ref Range Status   Specimen Description BLOOD LEFT ANTECUBITAL  Final   Special Requests IN PEDIATRIC BOTTLE Blood Culture adequate volume  Final   Culture NO GROWTH 2 DAYS  Final   Report Status PENDING  Incomplete  Culture, blood (routine x 2)     Status: None (Preliminary result)   Collection Time: 06/04/17 11:33 AM  Result Value Ref Range Status   Specimen Description BLOOD LEFT HAND  Final   Special Requests IN PEDIATRIC BOTTLE Blood Culture adequate volume  Final   Culture NO GROWTH 2 DAYS  Final   Report Status PENDING  Incomplete  Culture, group A strep     Status: None (Preliminary result)   Collection Time: 06/04/17  2:43 PM  Result Value Ref Range Status   Specimen Description THROAT  Final   Special Requests Normal  Final   Culture CULTURE REINCUBATED FOR BETTER GROWTH  Final   Report Status PENDING  Incomplete  Rapid strep screen (not at [facility])     Status: None   Collection Time: 06/06/17 12:24 AM  Result Value Ref Range Status   Streptococcus, Group A Screen (Direct) NEGATIVE NEGATIVE Final    Comment: (NOTE) A Rapid Antigen test may result negative if the antigen level in the sample is below the detection level of this test. The FDA has not cleared this test as a stand-alone test therefore the rapid antigen negative result has reflexed to a Group A Strep culture.   MRSA PCR Screening     Status: None   Collection Time: 06/06/17 10:52 PM  Result Value Ref Range Status   MRSA by PCR NEGATIVE NEGATIVE Final    Comment:        The GeneXpert MRSA Assay (FDA approved for NASAL specimens only), is one component of a comprehensive MRSA colonization surveillance program. It is not intended to diagnose MRSA infection nor to guide or monitor treatment  for MRSA infections.          Radiology Studies: Dg Chest 2 View  Result Date: 06/06/2017 CLINICAL DATA:  31 year old male with chest pain. EXAM: CHEST  2 VIEW COMPARISON:  None. FINDINGS: The heart size and mediastinal contours are within normal limits. Both lungs are clear. The visualized skeletal structures are unremarkable. IMPRESSION: No active cardiopulmonary disease. Electronically Signed   By: Elgie Collard M.D.   On: 06/06/2017 19:00        Scheduled Meds: . doxepin  100 mg Oral QHS  . enoxaparin (LOVENOX) injection  40 mg Subcutaneous Q24H  . feeding supplement (ENSURE ENLIVE)  237 mL Oral BID BM  . insulin aspart  0-15 Units Subcutaneous TID WC  . insulin aspart  0-5 Units Subcutaneous QHS  . insulin detemir  40 Units Subcutaneous Daily  . nicotine  21 mg Transdermal Q24H  . pantoprazole  40 mg Oral Daily  . potassium chloride  20 mEq Oral STAT  . traZODone  300 mg Oral QHS   Continuous Infusions: . sodium chloride    . sodium chloride 125 mL/hr at 06/07/17 0800  . piperacillin-tazobactam (ZOSYN)  IV Stopped (06/07/17 1048)  . vancomycin Stopped (06/07/17 1610)     LOS: 0 days    Time spent: 40 min    Alilah Mcmeans, MD [facility] Pager [phone]  If 7PM-7AM, please contact night-coverage [email or web address] Password [facility] 06/07/2017, 12:13 PM

## 2017-06-08 DIAGNOSIS — F329 Major depressive disorder, single episode, unspecified: Secondary | ICD-10-CM

## 2017-06-08 LAB — BASIC METABOLIC PANEL
ANION GAP: 4 — AB (ref 5–15)
BUN: 8 mg/dL (ref 6–20)
CHLORIDE: 106 mmol/L (ref 101–111)
CO2: 30 mmol/L (ref 22–32)
Calcium: 8.4 mg/dL — ABNORMAL LOW (ref 8.9–10.3)
Creatinine, Ser: 0.65 mg/dL (ref 0.61–1.24)
Glucose, Bld: 268 mg/dL — ABNORMAL HIGH (ref 65–99)
POTASSIUM: 3.7 mmol/L (ref 3.5–5.1)
SODIUM: 140 mmol/L (ref 135–145)

## 2017-06-08 LAB — GLUCOSE, CAPILLARY
GLUCOSE-CAPILLARY: 294 mg/dL — AB (ref 65–99)
GLUCOSE-CAPILLARY: 117 mg/dL — AB (ref 65–99)
Glucose-Capillary: 354 mg/dL — ABNORMAL HIGH (ref 65–99)
Glucose-Capillary: 227 mg/dL — ABNORMAL HIGH (ref 65–99)
Glucose-Capillary: 173 mg/dL — ABNORMAL HIGH (ref 65–99)

## 2017-06-08 MED ORDER — INSULIN DETEMIR 100 UNIT/ML ~~LOC~~ SOLN
5.0000 [IU] | Freq: Once | SUBCUTANEOUS | Status: AC
  Administered 2017-06-08: 5 [IU] via SUBCUTANEOUS
  Filled 2017-06-08: qty 0.05

## 2017-06-08 MED ORDER — INSULIN DETEMIR 100 UNIT/ML ~~LOC~~ SOLN
45.0000 [IU] | Freq: Every day | SUBCUTANEOUS | Status: DC
  Administered 2017-06-09 – 2017-06-11 (×3): 45 [IU] via SUBCUTANEOUS
  Filled 2017-06-08 (×3): qty 0.45

## 2017-06-08 MED ORDER — METOCLOPRAMIDE HCL 5 MG/ML IJ SOLN
5.0000 mg | Freq: Four times a day (QID) | INTRAMUSCULAR | Status: DC
  Administered 2017-06-08 – 2017-06-11 (×12): 5 mg via INTRAVENOUS
  Filled 2017-06-08 (×13): qty 2

## 2017-06-08 MED ORDER — OXYCODONE HCL 5 MG PO TABS
5.0000 mg | ORAL_TABLET | Freq: Four times a day (QID) | ORAL | Status: DC | PRN
  Administered 2017-06-08 – 2017-06-09 (×2): 5 mg via ORAL
  Filled 2017-06-08 (×3): qty 1

## 2017-06-08 MED ORDER — INSULIN ASPART 100 UNIT/ML ~~LOC~~ SOLN
3.0000 [IU] | Freq: Three times a day (TID) | SUBCUTANEOUS | Status: DC
  Administered 2017-06-08 – 2017-06-11 (×10): 3 [IU] via SUBCUTANEOUS

## 2017-06-08 NOTE — Care Management Note (Addendum)
Case Management Note  Patient Details  Name: [name] MRN: [id number] Date of Birth: October 26, 1986  Subjective/Objective: Pt presented for DKA- possible sepsis- initiated on IV antibiotics. Pt is currently living in a hotel now. CSW is aware to follow for additional housing resources if needed. Pt has insurance and PCP.                  Action/Plan: CM will monitor for home needs as well.   Expected Discharge Date:                  Expected Discharge Plan:  Home/Self Care  In-House Referral:  Clinical Social Work  Discharge planning Services  CM Consult  Post Acute Care Choice:    Choice offered to:     DME Arranged:    DME Agency:     HH Arranged:    HH Agency:     Status of Service:  In process, will continue to follow  If discussed at Long Length of Stay Meetings, dates discussed:    Additional Comments:  Gala Lewandowsky, RN 06/08/2017, 3:29 PM

## 2017-06-08 NOTE — Plan of Care (Signed)
Problem: Education: Goal: Knowledge of [facility] Education information/materials will improve Outcome: Progressing Patient aware of plan of care.  RN reviewed all medications with patient prior to administration, patient stated understanding.

## 2017-06-08 NOTE — Progress Notes (Signed)
PROGRESS NOTE    DAIL BALIUS  UMP:[id number] DOB: [birth year] DOA: 06/06/2017 PCP: Fleet Contras, MD    Brief Narrative: 31 y.o. male with medical history significant of DM type I, HTN, GERD, schizophrenia, anxiety, depression, substance abuse, and chronic pain; who presents with complaints of nausea, vomiting, and chest pain. He was just recently admitted under the critical care service at [facility] 3 days ago. He left AMA as he needed to go secure his new living arrangement at the hotel. After leaving patient notes that he was unable to fill the prescriptions for Lantus or Novolag vials as these were not covered by his insurance. He was admitted for recurrent symptoms of nausea and vomiting, chest pressure. .  Patient continues to have nausea and vomiting once earlier today.  Assessment & Plan:   Principal Problem:   DKA, type 1 ([name]) Active Problems:   Depression   Tobacco use disorder   SIRS (systemic inflammatory response syndrome) ([name])   Chest pain   GERD (gastroesophageal reflux disease)   DKA:  Non compliance to insulin.  His gap is closed. cbg's are well controlled.  Bicarb is normal.  Patient is back on his insulin regimen.  CBG (last 3)   Recent Labs  06/08/17 0749 06/08/17 1124 06/08/17 1651  GLUCAP 354* 227* 117*    Increased his levemir to 45 units daily, will probably add 2 units [facility] BEFORE discharge if he continues to have CBG'S greater than 200.    Hypokalemia: replaced. Pt refusing po potassium. Repeat K good.    Acute renal failure:   - possibly dehydration and DKA.  - creatinine normalized with hydration.    Mild elevation of the troponins:  Un clear etiology.  Echocardiogram ordered. Not significant.  EKG is normal sinus rhythm.  No chest pain today.     Fever , chills, leukocytosis. Londell Moh ua is negative. CXR is negative.  Blood cultures ordered and have been negative.  He was empirically started on IV vancomycin and  zosyn. Antibiotics discontinued, monitor off antibiotics.    Depression: no suicidal ideations.   Tobacco use:  Nicotine patch used.   Generalized body aches:  Requesting pain meds.  Tramadol didn't work.  Added oxycodone.   Nausea, vomiting: no abd pain.  Possibly from gastroparesis.  Added reglan IV, will change to po in am. Before discharge.       DVT prophylaxis: (Lovenox) Code Status: full code.  Family Communication: none at bedside. Discussed the plan of care with the patient.  Disposition Plan: d/c in am if no nausea or vomiting.    Consultants:   None.    Procedures: none.    Antimicrobials: none.    Subjective: No chest pain today. No cough,  Nausea and vomiting present.  No fever or chills.   Objective: Vitals:   06/08/17 0027 06/08/17 0517 06/08/17 1127 06/08/17 1652  BP: 125/88 124/81 113/76   Pulse: 92 90 92   Resp: 11 10 12    Temp: 98.9 F (37.2 C) 98.5 F (36.9 C) 98.6 F (37 C) 98.4 F (36.9 C)  TempSrc: Oral Oral Oral Oral  SpO2: 97% 97% 98% 97%  Weight:  90.6 kg (199 lb 11.2 oz)    Height:        Intake/Output Summary (Last 24 hours) at 06/08/17 1755 Last data filed at 06/08/17 1038  Gross per 24 hour  Intake             1653 ml  Output  3900 ml  Net            -2247 ml   Filed Weights   06/06/17 2249 06/07/17 0612 06/08/17 0517  Weight: 86 kg (189 lb 11.2 oz) 87 kg (191 lb 11.2 oz) 90.6 kg (199 lb 11.2 oz)    Examination:  General exam: comfortable.  Respiratory system: no wheezing or rhonchi.  Cardiovascular system:s1s2, RRR, no murmers.  Gastrointestinal system: abd is soft non tender ND BS+ Central nervous system: Alert and oriented to place, person and time. Non focal exam.  Extremities: Symmetric 5 x 5 power. Skin: No rashes, lesions or ulcers Psychiatry: anxious, fidgety.     Data Reviewed: I have personally reviewed following labs and imaging studies  CBC:  Recent Labs Lab 06/03/17 0100  06/03/17 0405 06/05/17 0531 06/06/17 1503 06/06/17 2312 06/07/17 0856  WBC 35.4* 28.9* 7.1 8.1  --  10.8*  HGB 12.0* 12.0* 10.8* 12.2* 11.3* 10.6*  HCT 38.6* 36.3* 30.9* 34.6* 32.7* 31.3*  MCV 94.1 87.5 82.6 82.4  --  81.7  PLT 454* 375 202 246  --  203   Basic Metabolic Panel:  Recent Labs Lab 06/03/17 0405  06/05/17 0531 06/06/17 1503 06/06/17 2219 06/07/17 0226 06/07/17 0757 06/08/17 0337  NA  --   < > 138 134* 138 138 142 140  K  --   < > 3.5 4.5 3.8 3.0* 3.0* 3.7  CL  --   < > 102 99* 102 106 108 106  CO2  --   < > 22 18* 22 24 27 30   GLUCOSE  --   < > 356* 455* 300* 222* 126* 268*  BUN  --   < > 13 12 11 9 7 8   CREATININE  --   < > 1.28* 1.25* 1.08 0.72 0.70 0.65  CALCIUM  --   < > 8.4* 9.2 9.1 8.3* 8.5* 8.4*  MG 2.7*  --  2.7*  --   --   --   --   --   PHOS 9.7*  --  1.6*  --   --   --   --   --   < > = values in this interval not displayed. GFR: Estimated Creatinine Clearance: 149.8 mL/min (by C-G formula based on SCr of 0.65 mg/dL). Liver Function Tests:  Recent Labs Lab 06/03/17 0404  AST 15  ALT 15*  ALKPHOS 131*  BILITOT 2.2*  PROT 5.4*  ALBUMIN 3.3*   No results for input(s): LIPASE, AMYLASE in the last 168 hours. No results for input(s): AMMONIA in the last 168 hours. Coagulation Profile: No results for input(s): INR, PROTIME in the last 168 hours. Cardiac Enzymes:  Recent Labs Lab 06/06/17 2219 06/07/17 0226 06/07/17 0757  TROPONINI 0.03* 0.03* 0.04*   BNP (last 3 results) No results for input(s): PROBNP in the last 8760 hours. HbA1C: No results for input(s): HGBA1C in the last 72 hours. CBG:  Recent Labs Lab 06/07/17 2056 06/07/17 2307 06/08/17 0749 06/08/17 1124 06/08/17 1651  GLUCAP 228* 294* 354* 227* 117*   Lipid Profile: No results for input(s): CHOL, HDL, LDLCALC, TRIG, CHOLHDL, LDLDIRECT in the last 72 hours. Thyroid Function Tests: No results for input(s): TSH, T4TOTAL, FREET4, T3FREE, THYROIDAB in the last 72  hours. Anemia Panel: No results for input(s): VITAMINB12, FOLATE, FERRITIN, TIBC, IRON, RETICCTPCT in the last 72 hours. Sepsis Labs:  Recent Labs Lab 06/04/17 1516 06/07/17 0009 06/07/17 0221  PROCALCITON 0.88  --   --  LATICACIDVEN  --  1.0 0.7    Recent Results (from the past 240 hour(s))  MRSA PCR Screening     Status: None   Collection Time: 06/03/17  8:17 AM  Result Value Ref Range Status   MRSA by PCR NEGATIVE NEGATIVE Final    Comment:        The GeneXpert MRSA Assay (FDA approved for NASAL specimens only), is one component of a comprehensive MRSA colonization surveillance program. It is not intended to diagnose MRSA infection nor to guide or monitor treatment for MRSA infections.   Culture, blood (routine x 2)     Status: None (Preliminary result)   Collection Time: 06/04/17 11:29 AM  Result Value Ref Range Status   Specimen Description BLOOD LEFT ANTECUBITAL  Final   Special Requests IN PEDIATRIC BOTTLE Blood Culture adequate volume  Final   Culture NO GROWTH 4 DAYS  Final   Report Status PENDING  Incomplete  Culture, blood (routine x 2)     Status: None (Preliminary result)   Collection Time: 06/04/17 11:33 AM  Result Value Ref Range Status   Specimen Description BLOOD LEFT HAND  Final   Special Requests IN PEDIATRIC BOTTLE Blood Culture adequate volume  Final   Culture NO GROWTH 4 DAYS  Final   Report Status PENDING  Incomplete  Culture, group A strep     Status: None   Collection Time: 06/04/17  2:43 PM  Result Value Ref Range Status   Specimen Description THROAT  Final   Special Requests Normal  Final   Culture NO GROUP A STREP (S.PYOGENES) ISOLATED  Final   Report Status 06/07/2017 FINAL  Final  Rapid strep screen (not at [facility])     Status: None   Collection Time: 06/06/17 12:24 AM  Result Value Ref Range Status   Streptococcus, Group A Screen (Direct) NEGATIVE NEGATIVE Final    Comment: (NOTE) A Rapid Antigen test may result negative if the  antigen level in the sample is below the detection level of this test. The FDA has not cleared this test as a stand-alone test therefore the rapid antigen negative result has reflexed to a Group A Strep culture.   Culture, group A strep     Status: None (Preliminary result)   Collection Time: 06/06/17 12:24 AM  Result Value Ref Range Status   Specimen Description THROAT  Final   Special Requests NONE Reflexed from [id number]  Final   Culture CULTURE REINCUBATED FOR BETTER GROWTH  Final   Report Status PENDING  Incomplete  MRSA PCR Screening     Status: None   Collection Time: 06/06/17 10:52 PM  Result Value Ref Range Status   MRSA by PCR NEGATIVE NEGATIVE Final    Comment:        The GeneXpert MRSA Assay (FDA approved for NASAL specimens only), is one component of a comprehensive MRSA colonization surveillance program. It is not intended to diagnose MRSA infection nor to guide or monitor treatment for MRSA infections.   Culture, blood (routine x 2)     Status: None (Preliminary result)   Collection Time: 06/06/17 11:11 PM  Result Value Ref Range Status   Specimen Description BLOOD RIGHT ARM  Final   Special Requests   Final    BOTTLES DRAWN AEROBIC ONLY Blood Culture adequate volume   Culture NO GROWTH 1 DAY  Final   Report Status PENDING  Incomplete  Culture, blood (routine x 2)     Status: None (Preliminary result)  Collection Time: 06/06/17 11:19 PM  Result Value Ref Range Status   Specimen Description BLOOD RIGHT HAND  Final   Special Requests   Final    BOTTLES DRAWN AEROBIC ONLY Blood Culture adequate volume   Culture NO GROWTH 1 DAY  Final   Report Status PENDING  Incomplete         Radiology Studies: Dg Chest 2 View  Result Date: 06/06/2017 CLINICAL DATA:  31 year old male with chest pain. EXAM: CHEST  2 VIEW COMPARISON:  None. FINDINGS: The heart size and mediastinal contours are within normal limits. Both lungs are clear. The visualized skeletal structures  are unremarkable. IMPRESSION: No active cardiopulmonary disease. Electronically Signed   By: Elgie Collard M.D.   On: 06/06/2017 19:00        Scheduled Meds: . doxepin  100 mg Oral QHS  . enoxaparin (LOVENOX) injection  40 mg Subcutaneous Q24H  . feeding supplement (GLUCERNA SHAKE)  237 mL Oral TID BM  . insulin aspart  0-15 Units Subcutaneous TID WC  . insulin aspart  0-5 Units Subcutaneous QHS  . insulin aspart  3 Units Subcutaneous TID WC  . [START ON 06/09/2017] insulin detemir  45 Units Subcutaneous Daily  . metoCLOPramide (REGLAN) injection  5 mg Intravenous Q6H  . nicotine  21 mg Transdermal Q24H  . pantoprazole  40 mg Oral Daily  . traZODone  300 mg Oral QHS   Continuous Infusions:    LOS: 1 day    Time spent: 35 min    Deniz Eskridge, MD [facility] Pager [phone]  If 7PM-7AM, please contact night-coverage [email or web address] Password [facility] 06/08/2017, 5:55 PM

## 2017-06-09 LAB — HEPATIC FUNCTION PANEL
ALBUMIN: 2.8 g/dL — AB (ref 3.5–5.0)
ALK PHOS: 73 U/L (ref 38–126)
ALT: 89 U/L — ABNORMAL HIGH (ref 17–63)
AST: 110 U/L — ABNORMAL HIGH (ref 15–41)
BILIRUBIN INDIRECT: 0.2 mg/dL — AB (ref 0.3–0.9)
Bilirubin, Direct: 0.1 mg/dL (ref 0.1–0.5)
TOTAL PROTEIN: 5 g/dL — AB (ref 6.5–8.1)
Total Bilirubin: 0.3 mg/dL (ref 0.3–1.2)

## 2017-06-09 LAB — GLUCOSE, CAPILLARY
GLUCOSE-CAPILLARY: 212 mg/dL — AB (ref 65–99)
GLUCOSE-CAPILLARY: 223 mg/dL — AB (ref 65–99)
GLUCOSE-CAPILLARY: 155 mg/dL — AB (ref 65–99)
GLUCOSE-CAPILLARY: 82 mg/dL (ref 65–99)

## 2017-06-09 LAB — CULTURE, GROUP A STREP (THRC)

## 2017-06-09 LAB — CULTURE, BLOOD (ROUTINE X 2)
CULTURE: NO GROWTH
Culture: NO GROWTH
SPECIAL REQUESTS: ADEQUATE
SPECIAL REQUESTS: ADEQUATE

## 2017-06-09 LAB — LIPASE, BLOOD: LIPASE: 20 U/L (ref 11–51)

## 2017-06-09 MED ORDER — INSULIN DETEMIR 100 UNIT/ML ~~LOC~~ SOLN: 45.0000 [IU] | Freq: Every day | SUBCUTANEOUS | 11 refills | Status: DC

## 2017-06-09 MED ORDER — PHENOL 1.4 % MT LIQD
1.0000 | OROMUCOSAL | Status: DC | PRN
  Administered 2017-06-09: 1 via OROMUCOSAL
  Filled 2017-06-09: qty 177

## 2017-06-09 MED ORDER — GLUCERNA SHAKE PO LIQD: 237.0000 mL | Freq: Three times a day (TID) | ORAL | 0 refills | Status: DC

## 2017-06-09 MED ORDER — SODIUM CHLORIDE 0.9 % IV SOLN
INTRAVENOUS | Status: DC
  Administered 2017-06-09 – 2017-06-11 (×4): via INTRAVENOUS

## 2017-06-09 MED ORDER — PROMETHAZINE HCL 25 MG/ML IJ SOLN
12.5000 mg | Freq: Four times a day (QID) | INTRAMUSCULAR | Status: DC | PRN
  Administered 2017-06-10: 12.5 mg via INTRAVENOUS
  Filled 2017-06-09: qty 1

## 2017-06-09 MED ORDER — INSULIN ASPART 100 UNIT/ML ~~LOC~~ SOLN: SUBCUTANEOUS | 11 refills | Status: DC

## 2017-06-09 MED ORDER — OXYCODONE HCL 5 MG PO TABS
10.0000 mg | ORAL_TABLET | Freq: Four times a day (QID) | ORAL | Status: DC | PRN
  Administered 2017-06-09 – 2017-06-10 (×3): 10 mg via ORAL
  Filled 2017-06-09 (×3): qty 2

## 2017-06-09 MED ORDER — PANTOPRAZOLE SODIUM 40 MG PO TBEC: 40.0000 mg | DELAYED_RELEASE_TABLET | Freq: Every day | ORAL | 0 refills | Status: DC

## 2017-06-09 NOTE — Evaluation (Signed)
Clinical/Bedside Swallow Evaluation Patient Details  Name: [name] MRN: [id number] Date of Birth: January 26, 1986  Today's Date: 06/09/2017 Time: SLP Start Time (ACUTE ONLY): 1200 SLP Stop Time (ACUTE ONLY): 1210 SLP Time Calculation (min) (ACUTE ONLY): 10 min  Past Medical History:  Past Medical History:  Diagnosis Date  . Anemia   . Anxiety   . Arthritis    "both hips; both shoulders" (06/06/2017)  . Chicken pox   . Childhood asthma   . Chronic pain   . Depression   . DKA (diabetic ketoacidoses) ([name]) 11/18/2014  . GERD (gastroesophageal reflux disease)   . Hypertension   . Migraine    "a few/year" (06/06/2017)  . Noncompliance with medication regimen   . Pneumonia    "several times" (06/06/2017)  . Polysubstance abuse   . Schizo affective schizophrenia ([name])   . Scoliosis   . Type I diabetes mellitus ([name])    Past Surgical History:  Past Surgical History:  Procedure Laterality Date  . CARDIAC CATHETERIZATION  11/2016  . INCISION AND DRAINAGE ABSCESS Left 11/2011   "MRSA removed off my thumb"   HPI:  Patient is a 31 y.o. male with medical history significant of DM type I, HTN, GERD, schizophrenia, anxiety, depression, substance abuse, and chronic pain; who presents with complaints of nausea, vomiting, and chest pain. He was just recently admitted under the critical care service at [facility] 3 days ago. He left AMA as he needed to go secure his new living arrangement at the hotel. After leaving patient notes that he was unable to fill the prescriptions for Lantus or Novolag vials as these were not covered by his insurance. He was admitted for recurrent symptoms of nausea and vomiting, chest pressure. Patient with c/o this morning (6/23) of difficulty swallowing.    Assessment / Plan / Recommendation Clinical Impression  Patient presents with a functional swallow with no overt s/s of aspiration or penetration. Patient c/o feeling that solid textures get briefly stuck in  throat and won't go down until he drinks liquids. He also stated there is pain associated with swallowing solids. SLP noticed appearance of inflamation of oropharynx and suspect this is due to patient's h/o GERD.   SLP Visit Diagnosis: Dysphagia, oropharyngeal phase (R13.12)    Aspiration Risk  No limitations    Diet Recommendation Thin liquid;Regular   Liquid Administration via: Cup;Straw Medication Administration: Whole meds with liquid Supervision: Patient able to self feed Postural Changes: Seated upright at 90 degrees    Other  Recommendations     Follow up Recommendations None      Frequency and Duration   N/A         Prognosis   Good     Swallow Study   General Date of Onset: 06/09/17 HPI: Patient is a 31 y.o. male with medical history significant of DM type I, HTN, GERD, schizophrenia, anxiety, depression, substance abuse, and chronic pain; who presents with complaints of nausea, vomiting, and chest pain. He was just recently admitted under the critical care service at [facility] 3 days ago. He left AMA as he needed to go secure his new living arrangement at the hotel. After leaving patient notes that he was unable to fill the prescriptions for Lantus or Novolag vials as these were not covered by his insurance. He was admitted for recurrent symptoms of nausea and vomiting, chest pressure. Patient with c/o this morning (6/23) of difficulty swallowing.  Type of Study: Bedside Swallow Evaluation Previous Swallow Assessment:  N/A Diet Prior to this Study: Regular;Thin liquids Temperature Spikes Noted: No Respiratory Status: Room air History of Recent Intubation: No Behavior/Cognition: Alert;Cooperative Oral Cavity Assessment: Within Functional Limits Oral Care Completed by SLP: No Oral Cavity - Dentition: Poor condition Vision: Functional for self-feeding Self-Feeding Abilities: Able to feed self Patient Positioning: Upright in bed Baseline Vocal Quality:  Normal Volitional Cough: Strong Volitional Swallow: Able to elicit    Oral/Motor/Sensory Function Overall Oral Motor/Sensory Function: Within functional limits   Ice Chips     Thin Liquid Thin Liquid: Within functional limits Presentation: Cup;Self Fed;Straw Other Comments: No overt s/s of aspiration or penetration    Nectar Thick Nectar Thick Liquid: Not tested   Honey Thick Honey Thick Liquid: Not tested   Puree Puree: Not tested   Solid   GO   Solid: Impaired Pharyngeal Phase Impairments: Other (comments) (patient c/o of pain and difficulty ) Other Comments: Patient reported difficulty swallowing with pain, requiring sips of water to fully transit         Angela Nevin, MA, CCC-SLP 06/09/17 1:49 PM

## 2017-06-09 NOTE — Progress Notes (Signed)
PROGRESS NOTE    [name]  ZOX:[id number] DOB: 12-12-1986 DOA: 06/06/2017 PCP: Fleet Contras, MD    Brief Narrative: 31 y.o. male with medical history significant of DM type I, HTN, GERD, schizophrenia, anxiety, depression, substance abuse, and chronic pain; who presents with complaints of nausea, vomiting, and chest pain. He was just recently admitted under the critical care service at [facility] 3 days ago. He left AMA as he needed to go secure his new living arrangement at the hotel. After leaving patient notes that he was unable to fill the prescriptions for Lantus or Novolag vials as these were not covered by his insurance. He was admitted for recurrent symptoms of nausea and vomiting, chest pressure. .  Patient continues to have nausea and vomiting once this am and later this afternoon.  Assessment & Plan:   Principal Problem:   DKA, type 1 ([name]) Active Problems:   Depression   Tobacco use disorder   SIRS (systemic inflammatory response syndrome) ([name])   Chest pain   GERD (gastroesophageal reflux disease)   DKA:  Non compliance to insulin.  His gap is closed. cbg's are well controlled.  Bicarb is normal.  Patient is back on his insulin regimen.  CBG (last 3)   Recent Labs  06/09/17 0735 06/09/17 1144 06/09/17 1615  GLUCAP 212* 223* 155*    Increased his levemir to 45 units daily, resume SSI.    Hypokalemia: replaced. Pt refusing po potassium. Repeat K good.    Acute renal failure:   - possibly dehydration and DKA.  - creatinine normalized with hydration.    Mild elevation of the troponins:  Un clear etiology.  Echocardiogram ordered. Not significant.  EKG is normal sinus rhythm.  No chest pain today.     Fever , chills, leukocytosis. Londell Moh ua is negative. CXR is negative.  Blood cultures ordered and have been negative.  He was empirically started on IV vancomycin and zosyn. Antibiotics discontinued, monitor off antibiotics.  No fever.       Depression: no suicidal ideations.   Tobacco use:  Nicotine patch used.   Generalized body aches:  Requesting pain meds.  Tramadol didn't work.  Added oxycodone.   Nausea, vomiting: no abd pain.  Possibly from gastroparesis.  Added reglan IV,  Still vomited towice, getting lft's and lipase.  Added phenergan . Get BMP.   Difficulty swallowing probably from vomiting. SLP eval normal.       DVT prophylaxis: (Lovenox) Code Status: full code.  Family Communication: none at bedside. Discussed the plan of care with the patient.  Disposition Plan: d/c in am if no nausea or vomiting.    Consultants:   None.    Procedures: none.    Antimicrobials: none.    Subjective: NAUSEATED and 2 episodes of vomiting. Today.   Objective: Vitals:   06/08/17 2100 06/09/17 0500 06/09/17 0800 06/09/17 1418  BP: (!) 160/100 (!) 163/103 (!) 152/92 109/75  Pulse: 90 93 86 94  Resp: 16 16 14    Temp: 98.6 F (37 C) 98.8 F (37.1 C) 98.1 F (36.7 C)   TempSrc: Oral Oral Oral Oral  SpO2: 100% 97% 96% 96%  Weight:  90.8 kg (200 lb 3.2 oz)    Height:   6\' 1"  (1.854 m)     Intake/Output Summary (Last 24 hours) at 06/09/17 1755 Last data filed at 06/08/17 1852  Gross per 24 hour  Intake  240 ml  Output                0 ml  Net              240 ml   Filed Weights   06/07/17 0612 06/08/17 0517 06/09/17 0500  Weight: 87 kg (191 lb 11.2 oz) 90.6 kg (199 lb 11.2 oz) 90.8 kg (200 lb 3.2 oz)    Examination:  General exam: comfortable. Not in any distress.  Respiratory system: no wheezing or rhonchi. Good air entry.  Cardiovascular system:s1s2, RRR, no murmers.  Gastrointestinal system: abd is soft non tender ND BS+ Central nervous system: Alert and oriented to place, person and time. Non focal exam.  Extremities: Symmetric 5 x 5 power. Skin: No rashes, lesions or ulcers Psychiatry: anxious, fidgety.     Data Reviewed: I have personally reviewed following  labs and imaging studies  CBC:  Recent Labs Lab 06/03/17 0100 06/03/17 0405 06/05/17 0531 06/06/17 1503 06/06/17 2312 06/07/17 0856  WBC 35.4* 28.9* 7.1 8.1  --  10.8*  HGB 12.0* 12.0* 10.8* 12.2* 11.3* 10.6*  HCT 38.6* 36.3* 30.9* 34.6* 32.7* 31.3*  MCV 94.1 87.5 82.6 82.4  --  81.7  PLT 454* 375 202 246  --  203   Basic Metabolic Panel:  Recent Labs Lab 06/03/17 0405  06/05/17 0531 06/06/17 1503 06/06/17 2219 06/07/17 0226 06/07/17 0757 06/08/17 0337  NA  --   < > 138 134* 138 138 142 140  K  --   < > 3.5 4.5 3.8 3.0* 3.0* 3.7  CL  --   < > 102 99* 102 106 108 106  CO2  --   < > 22 18* 22 24 27 30   GLUCOSE  --   < > 356* 455* 300* 222* 126* 268*  BUN  --   < > 13 12 11 9 7 8   CREATININE  --   < > 1.28* 1.25* 1.08 0.72 0.70 0.65  CALCIUM  --   < > 8.4* 9.2 9.1 8.3* 8.5* 8.4*  MG 2.7*  --  2.7*  --   --   --   --   --   PHOS 9.7*  --  1.6*  --   --   --   --   --   < > = values in this interval not displayed. GFR: Estimated Creatinine Clearance: 149.8 mL/min (by C-G formula based on SCr of 0.65 mg/dL). Liver Function Tests:  Recent Labs Lab 06/03/17 0404  AST 15  ALT 15*  ALKPHOS 131*  BILITOT 2.2*  PROT 5.4*  ALBUMIN 3.3*   No results for input(s): LIPASE, AMYLASE in the last 168 hours. No results for input(s): AMMONIA in the last 168 hours. Coagulation Profile: No results for input(s): INR, PROTIME in the last 168 hours. Cardiac Enzymes:  Recent Labs Lab 06/06/17 2219 06/07/17 0226 06/07/17 0757  TROPONINI 0.03* 0.03* 0.04*   BNP (last 3 results) No results for input(s): PROBNP in the last 8760 hours. HbA1C: No results for input(s): HGBA1C in the last 72 hours. CBG:  Recent Labs Lab 06/08/17 1651 06/08/17 2126 06/09/17 0735 06/09/17 1144 06/09/17 1615  GLUCAP 117* 173* 212* 223* 155*   Lipid Profile: No results for input(s): CHOL, HDL, LDLCALC, TRIG, CHOLHDL, LDLDIRECT in the last 72 hours. Thyroid Function Tests: No results for  input(s): TSH, T4TOTAL, FREET4, T3FREE, THYROIDAB in the last 72 hours. Anemia Panel: No results for input(s): VITAMINB12, FOLATE, FERRITIN, TIBC, IRON,  RETICCTPCT in the last 72 hours. Sepsis Labs:  Recent Labs Lab 06/04/17 1516 06/07/17 0009 06/07/17 0221  PROCALCITON 0.88  --   --   LATICACIDVEN  --  1.0 0.7    Recent Results (from the past 240 hour(s))  MRSA PCR Screening     Status: None   Collection Time: 06/03/17  8:17 AM  Result Value Ref Range Status   MRSA by PCR NEGATIVE NEGATIVE Final    Comment:        The GeneXpert MRSA Assay (FDA approved for NASAL specimens only), is one component of a comprehensive MRSA colonization surveillance program. It is not intended to diagnose MRSA infection nor to guide or monitor treatment for MRSA infections.   Culture, blood (routine x 2)     Status: None   Collection Time: 06/04/17 11:29 AM  Result Value Ref Range Status   Specimen Description BLOOD LEFT ANTECUBITAL  Final   Special Requests IN PEDIATRIC BOTTLE Blood Culture adequate volume  Final   Culture NO GROWTH 5 DAYS  Final   Report Status 06/09/2017 FINAL  Final  Culture, blood (routine x 2)     Status: None   Collection Time: 06/04/17 11:33 AM  Result Value Ref Range Status   Specimen Description BLOOD LEFT HAND  Final   Special Requests IN PEDIATRIC BOTTLE Blood Culture adequate volume  Final   Culture NO GROWTH 5 DAYS  Final   Report Status 06/09/2017 FINAL  Final  Culture, group A strep     Status: None   Collection Time: 06/04/17  2:43 PM  Result Value Ref Range Status   Specimen Description THROAT  Final   Special Requests Normal  Final   Culture NO GROUP A STREP (S.PYOGENES) ISOLATED  Final   Report Status 06/07/2017 FINAL  Final  Rapid strep screen (not at [facility])     Status: None   Collection Time: 06/06/17 12:24 AM  Result Value Ref Range Status   Streptococcus, Group A Screen (Direct) NEGATIVE NEGATIVE Final    Comment: (NOTE) A Rapid Antigen test  may result negative if the antigen level in the sample is below the detection level of this test. The FDA has not cleared this test as a stand-alone test therefore the rapid antigen negative result has reflexed to a Group A Strep culture.   Culture, group A strep     Status: None   Collection Time: 06/06/17 12:24 AM  Result Value Ref Range Status   Specimen Description THROAT  Final   Special Requests NONE Reflexed from [id number]  Final   Culture NO GROUP A STREP (S.PYOGENES) ISOLATED  Final   Report Status 06/09/2017 FINAL  Final  MRSA PCR Screening     Status: None   Collection Time: 06/06/17 10:52 PM  Result Value Ref Range Status   MRSA by PCR NEGATIVE NEGATIVE Final    Comment:        The GeneXpert MRSA Assay (FDA approved for NASAL specimens only), is one component of a comprehensive MRSA colonization surveillance program. It is not intended to diagnose MRSA infection nor to guide or monitor treatment for MRSA infections.   Culture, blood (routine x 2)     Status: None (Preliminary result)   Collection Time: 06/06/17 11:11 PM  Result Value Ref Range Status   Specimen Description BLOOD RIGHT ARM  Final   Special Requests   Final    BOTTLES DRAWN AEROBIC ONLY Blood Culture adequate volume   Culture NO GROWTH  2 DAYS  Final   Report Status PENDING  Incomplete  Culture, blood (routine x 2)     Status: None (Preliminary result)   Collection Time: 06/06/17 11:19 PM  Result Value Ref Range Status   Specimen Description BLOOD RIGHT HAND  Final   Special Requests   Final    BOTTLES DRAWN AEROBIC ONLY Blood Culture adequate volume   Culture NO GROWTH 2 DAYS  Final   Report Status PENDING  Incomplete         Radiology Studies: No results found.      Scheduled Meds: . doxepin  100 mg Oral QHS  . enoxaparin (LOVENOX) injection  40 mg Subcutaneous Q24H  . feeding supplement (GLUCERNA SHAKE)  237 mL Oral TID BM  . insulin aspart  0-15 Units Subcutaneous TID WC  .  insulin aspart  0-5 Units Subcutaneous QHS  . insulin aspart  3 Units Subcutaneous TID WC  . insulin detemir  45 Units Subcutaneous Daily  . metoCLOPramide (REGLAN) injection  5 mg Intravenous Q6H  . nicotine  21 mg Transdermal Q24H  . pantoprazole  40 mg Oral Daily  . traZODone  300 mg Oral QHS   Continuous Infusions: . sodium chloride       LOS: 2 days    Time spent: 35 min    Tiffiney Sparrow, MD [facility] Pager [phone]  If 7PM-7AM, please contact night-coverage [email or web address] Password [facility] 06/09/2017, 5:55 PM

## 2017-06-10 ENCOUNTER — Inpatient Hospital Stay (HOSPITAL_COMMUNITY): Payer: Medicare Other

## 2017-06-10 DIAGNOSIS — R079 Chest pain, unspecified: Secondary | ICD-10-CM

## 2017-06-10 LAB — GLUCOSE, CAPILLARY
GLUCOSE-CAPILLARY: 318 mg/dL — AB (ref 65–99)
Glucose-Capillary: 278 mg/dL — ABNORMAL HIGH (ref 65–99)
Glucose-Capillary: 248 mg/dL — ABNORMAL HIGH (ref 65–99)
Glucose-Capillary: 190 mg/dL — ABNORMAL HIGH (ref 65–99)

## 2017-06-10 LAB — BASIC METABOLIC PANEL
ANION GAP: 7 (ref 5–15)
BUN: 10 mg/dL (ref 6–20)
CALCIUM: 8.9 mg/dL (ref 8.9–10.3)
CO2: 30 mmol/L (ref 22–32)
CREATININE: 0.74 mg/dL (ref 0.61–1.24)
Chloride: 102 mmol/L (ref 101–111)
GLUCOSE: 266 mg/dL — AB (ref 65–99)
Potassium: 4.1 mmol/L (ref 3.5–5.1)
Sodium: 139 mmol/L (ref 135–145)

## 2017-06-10 MED ORDER — IOPAMIDOL (ISOVUE-370) INJECTION 76%
INTRAVENOUS | Status: AC
  Administered 2017-06-10: 100 mL via INTRAVENOUS
  Filled 2017-06-10: qty 100

## 2017-06-10 MED ORDER — KETOROLAC TROMETHAMINE 30 MG/ML IJ SOLN
15.0000 mg | Freq: Three times a day (TID) | INTRAMUSCULAR | Status: DC | PRN
  Administered 2017-06-10 – 2017-06-11 (×3): 15 mg via INTRAVENOUS
  Filled 2017-06-10 (×3): qty 1

## 2017-06-10 MED ORDER — OXYCODONE HCL 5 MG PO TABS
5.0000 mg | ORAL_TABLET | Freq: Four times a day (QID) | ORAL | Status: DC | PRN
  Administered 2017-06-10 – 2017-06-11 (×2): 5 mg via ORAL
  Filled 2017-06-10 (×3): qty 1

## 2017-06-10 NOTE — Plan of Care (Signed)
Problem: Education: Goal: Knowledge of [facility] Education information/materials will improve Outcome: Progressing Patient aware of plan of care.  RN provided medication education on medications administered.  Patient stated understanding.

## 2017-06-10 NOTE — Progress Notes (Signed)
PROGRESS NOTE    [name]  ZOX:[id number] DOB: 10/03/1986 DOA: 06/06/2017 PCP: Fleet Contras, MD    Brief Narrative: 31 y.o. male with medical history significant of DM type I, HTN, GERD, schizophrenia, anxiety, depression, substance abuse, and chronic pain; who presents with complaints of nausea, vomiting, and chest pain. He was just recently admitted under the critical care service at [facility] 3 days ago. He left AMA as he needed to go secure his new living arrangement at the hotel. After leaving patient notes that he was unable to fill the prescriptions for Lantus or Novolag vials as these were not covered by his insurance. He was admitted for recurrent symptoms of nausea and vomiting, chest pressure. .  Patient continues to have nausea, no vomiting though. He reports chest pain on taking a deep  Breaths.  Assessment & Plan:   Principal Problem:   DKA, type 1 ([name]) Active Problems:   Depression   Tobacco use disorder   SIRS (systemic inflammatory response syndrome) ([name])   Chest pain   GERD (gastroesophageal reflux disease)   DKA:   secondary to Non compliance to insulin.  His gap is closed.  Bicarb is normal.  Patient is back on his insulin regimen.  CBG (last 3)   Recent Labs  06/10/17 0748 06/10/17 1117 06/10/17 1624  GLUCAP 318* 278* 248*    Increase his levemir to 50 units and add 3 units of novolog pre meal coverage.    Hypokalemia: replaced. Pt refusing po potassium. Repeat K good.    Acute renal failure:   - possibly dehydration and DKA.  - creatinine normalized with hydration.    Mild elevation of the troponins:  Un clear etiology.  Echocardiogram ordered. Not significant.  EKG is normal sinus rhythm.      Fever , chills, leukocytosis. Londell Moh ua is negative. CXR is negative.  Blood cultures ordered and have been negative.  He was empirically started on IV vancomycin and zosyn. Antibiotics discontinued, monitor off antibiotics.        Depression: no suicidal ideations.   Tobacco use:  Nicotine patch used.   Generalized body aches:  Requesting pain meds.  Tramadol didn't work.  Added oxycodone.   Nausea, vomiting: no abd pain.  Possibly from gastroparesis.  Added phenergan . Liver function tests slightly elevated enzymes.  Lipase normal.  Sent for hepatitis panel.   Difficulty swallowing probably from vomiting. SLP eval normal.    Chest pain on deep breathing:  Probably pleuritis.  CT angio of the chest, neg for PE.  5.7 MM nodule in the right lung, since he continues to smoke recommend outpatient CT in one year for follow up .       DVT prophylaxis: (Lovenox) Code Status: full code.  Family Communication: none at bedside. Discussed the plan of care with the patient.  Disposition Plan: d/c in am if no nausea or vomiting.    Consultants:   None.    Procedures: echocardiogram.    Antimicrobials: none.    Subjective: No vomiting.  Nauseated, better than yesterday.  Some chest pain on deep breathing.  No headache and dizziness.   Objective: Vitals:   06/09/17 1418 06/09/17 2003 06/10/17 0500 06/10/17 1427  BP: 109/75 123/80 136/86 (!) 146/91  Pulse: 94 85 95 87  Resp:  16 16 18   Temp:  98.1 F (36.7 C) 98.6 F (37 C) 98.6 F (37 C)  TempSrc: Oral Oral Oral Oral  SpO2: 96% 96% 95% 98%  Weight:      Height:        Intake/Output Summary (Last 24 hours) at 06/10/17 1752 Last data filed at 06/10/17 1300  Gross per 24 hour  Intake          2097.53 ml  Output                0 ml  Net          2097.53 ml   Filed Weights   06/07/17 0612 06/08/17 0517 06/09/17 0500  Weight: 87 kg (191 lb 11.2 oz) 90.6 kg (199 lb 11.2 oz) 90.8 kg (200 lb 3.2 oz)    Examination:  Stable   General exam: comfortable. Not in any distress.  Respiratory system: no wheezing or rhonchi. Good air entry.  Cardiovascular system:s1s2, RRR, no murmers.  Gastrointestinal system: abd is soft non  tender ND BS+ Central nervous system: Alert and oriented to place, person and time. Non focal exam.  Extremities: Symmetric 5 x 5 power. Skin: No rashes, lesions or ulcers Psychiatry: flat affect.     Data Reviewed: I have personally reviewed following labs and imaging studies  CBC:  Recent Labs Lab 06/05/17 0531 06/06/17 1503 06/06/17 2312 06/07/17 0856  WBC 7.1 8.1  --  10.8*  HGB 10.8* 12.2* 11.3* 10.6*  HCT 30.9* 34.6* 32.7* 31.3*  MCV 82.6 82.4  --  81.7  PLT 202 246  --  203   Basic Metabolic Panel:  Recent Labs Lab 06/05/17 0531  06/06/17 2219 06/07/17 0226 06/07/17 0757 06/08/17 0337 06/10/17 1118  NA 138  < > 138 138 142 140 139  K 3.5  < > 3.8 3.0* 3.0* 3.7 4.1  CL 102  < > 102 106 108 106 102  CO2 22  < > 22 24 27 30 30   GLUCOSE 356*  < > 300* 222* 126* 268* 266*  BUN 13  < > 11 9 7 8 10   CREATININE 1.28*  < > 1.08 0.72 0.70 0.65 0.74  CALCIUM 8.4*  < > 9.1 8.3* 8.5* 8.4* 8.9  MG 2.7*  --   --   --   --   --   --   PHOS 1.6*  --   --   --   --   --   --   < > = values in this interval not displayed. GFR: Estimated Creatinine Clearance: 149.8 mL/min (by C-G formula based on SCr of 0.74 mg/dL). Liver Function Tests:  Recent Labs Lab 06/09/17 1827  AST 110*  ALT 89*  ALKPHOS 73  BILITOT 0.3  PROT 5.0*  ALBUMIN 2.8*    Recent Labs Lab 06/09/17 1827  LIPASE 20   No results for input(s): AMMONIA in the last 168 hours. Coagulation Profile: No results for input(s): INR, PROTIME in the last 168 hours. Cardiac Enzymes:  Recent Labs Lab 06/06/17 2219 06/07/17 0226 06/07/17 0757  TROPONINI 0.03* 0.03* 0.04*   BNP (last 3 results) No results for input(s): PROBNP in the last 8760 hours. HbA1C: No results for input(s): HGBA1C in the last 72 hours. CBG:  Recent Labs Lab 06/09/17 1615 06/09/17 2137 06/10/17 0748 06/10/17 1117 06/10/17 1624  GLUCAP 155* 82 318* 278* 248*   Lipid Profile: No results for input(s): CHOL, HDL, LDLCALC,  TRIG, CHOLHDL, LDLDIRECT in the last 72 hours. Thyroid Function Tests: No results for input(s): TSH, T4TOTAL, FREET4, T3FREE, THYROIDAB in the last 72 hours. Anemia Panel: No results for input(s): VITAMINB12, FOLATE, FERRITIN, TIBC,  IRON, RETICCTPCT in the last 72 hours. Sepsis Labs:  Recent Labs Lab 06/04/17 1516 06/07/17 0009 06/07/17 0221  PROCALCITON 0.88  --   --   LATICACIDVEN  --  1.0 0.7    Recent Results (from the past 240 hour(s))  MRSA PCR Screening     Status: None   Collection Time: 06/03/17  8:17 AM  Result Value Ref Range Status   MRSA by PCR NEGATIVE NEGATIVE Final    Comment:        The GeneXpert MRSA Assay (FDA approved for NASAL specimens only), is one component of a comprehensive MRSA colonization surveillance program. It is not intended to diagnose MRSA infection nor to guide or monitor treatment for MRSA infections.   Culture, blood (routine x 2)     Status: None   Collection Time: 06/04/17 11:29 AM  Result Value Ref Range Status   Specimen Description BLOOD LEFT ANTECUBITAL  Final   Special Requests IN PEDIATRIC BOTTLE Blood Culture adequate volume  Final   Culture NO GROWTH 5 DAYS  Final   Report Status 06/09/2017 FINAL  Final  Culture, blood (routine x 2)     Status: None   Collection Time: 06/04/17 11:33 AM  Result Value Ref Range Status   Specimen Description BLOOD LEFT HAND  Final   Special Requests IN PEDIATRIC BOTTLE Blood Culture adequate volume  Final   Culture NO GROWTH 5 DAYS  Final   Report Status 06/09/2017 FINAL  Final  Culture, group A strep     Status: None   Collection Time: 06/04/17  2:43 PM  Result Value Ref Range Status   Specimen Description THROAT  Final   Special Requests Normal  Final   Culture NO GROUP A STREP (S.PYOGENES) ISOLATED  Final   Report Status 06/07/2017 FINAL  Final  Rapid strep screen (not at [facility])     Status: None   Collection Time: 06/06/17 12:24 AM  Result Value Ref Range Status   Streptococcus,  Group A Screen (Direct) NEGATIVE NEGATIVE Final    Comment: (NOTE) A Rapid Antigen test may result negative if the antigen level in the sample is below the detection level of this test. The FDA has not cleared this test as a stand-alone test therefore the rapid antigen negative result has reflexed to a Group A Strep culture.   Culture, group A strep     Status: None   Collection Time: 06/06/17 12:24 AM  Result Value Ref Range Status   Specimen Description THROAT  Final   Special Requests NONE Reflexed from [id number]  Final   Culture NO GROUP A STREP (S.PYOGENES) ISOLATED  Final   Report Status 06/09/2017 FINAL  Final  MRSA PCR Screening     Status: None   Collection Time: 06/06/17 10:52 PM  Result Value Ref Range Status   MRSA by PCR NEGATIVE NEGATIVE Final    Comment:        The GeneXpert MRSA Assay (FDA approved for NASAL specimens only), is one component of a comprehensive MRSA colonization surveillance program. It is not intended to diagnose MRSA infection nor to guide or monitor treatment for MRSA infections.   Culture, blood (routine x 2)     Status: None (Preliminary result)   Collection Time: 06/06/17 11:11 PM  Result Value Ref Range Status   Specimen Description BLOOD RIGHT ARM  Final   Special Requests   Final    BOTTLES DRAWN AEROBIC ONLY Blood Culture adequate volume   Culture NO  GROWTH 3 DAYS  Final   Report Status PENDING  Incomplete  Culture, blood (routine x 2)     Status: None (Preliminary result)   Collection Time: 06/06/17 11:19 PM  Result Value Ref Range Status   Specimen Description BLOOD RIGHT HAND  Final   Special Requests   Final    BOTTLES DRAWN AEROBIC ONLY Blood Culture adequate volume   Culture NO GROWTH 3 DAYS  Final   Report Status PENDING  Incomplete         Radiology Studies: Ct Angio Chest Pe W Or Wo Contrast  Result Date: 06/10/2017 CLINICAL DATA:  Sternal chest pain for 3 or 4 days. EXAM: CT ANGIOGRAPHY CHEST WITH CONTRAST  TECHNIQUE: Multidetector CT imaging of the chest was performed using the standard protocol during bolus administration of intravenous contrast. Multiplanar CT image reconstructions and MIPs were obtained to evaluate the vascular anatomy. CONTRAST:  100 mL of Isovue 370 COMPARISON:  Chest x-ray June 06, 2017 FINDINGS: Cardiovascular: No coronary artery calcifications are identified. The heart is normal. The thoracic aorta is non aneurysmal with no atherosclerosis or dissection. No pulmonary emboli are identified. Mediastinum/Nodes: Mild gynecomastia. The thyroid is normal. The esophagus is unremarkable. No adenopathy. Lungs/Pleura: Central airways are normal. No pneumothorax. Small bilateral pleural effusions with associated atelectasis are identified. There is a 5.7 mm nodule in the right lung on series 7, image 68. No other nodules. No masses. No suspicious infiltrates. Upper Abdomen: Limited views the upper abdomen are unremarkable. Musculoskeletal: No chest wall abnormality. No acute or significant osseous findings. Review of the MIP images confirms the above findings. IMPRESSION: 1. Small bilateral pleural effusions with compressive atelectasis. 2. No pulmonary emboli are identified. 3. No cause for the patient's symptoms is identified. 4. 5.7 mm nodule in the right lung. No follow-up needed if patient is low-risk. Non-contrast chest CT can be considered in 12 months if patient is high-risk. This recommendation follows the consensus statement: Guidelines for Management of Incidental Pulmonary Nodules Detected on CT Images: From the Fleischner Society 2017; Radiology 2017; 284:228-243. Electronically Signed   By: Gerome Sam III M.D   On: 06/10/2017 15:46        Scheduled Meds: . doxepin  100 mg Oral QHS  . enoxaparin (LOVENOX) injection  40 mg Subcutaneous Q24H  . feeding supplement (GLUCERNA SHAKE)  237 mL Oral TID BM  . insulin aspart  0-15 Units Subcutaneous TID WC  . insulin aspart  0-5 Units  Subcutaneous QHS  . insulin aspart  3 Units Subcutaneous TID WC  . insulin detemir  45 Units Subcutaneous Daily  . metoCLOPramide (REGLAN) injection  5 mg Intravenous Q6H  . nicotine  21 mg Transdermal Q24H  . pantoprazole  40 mg Oral Daily  . traZODone  300 mg Oral QHS   Continuous Infusions: . sodium chloride 75 mL/hr at 06/10/17 0518     LOS: 3 days    Time spent: 35 min    Dannilynn Gallina, MD [facility] Pager [phone]  If 7PM-7AM, please contact night-coverage [email or web address] Password [email or web address] 06/10/2017, 5:52 PM

## 2017-06-11 DIAGNOSIS — E1069 Type 1 diabetes mellitus with other specified complication: Secondary | ICD-10-CM

## 2017-06-11 LAB — HEPATITIS PANEL, ACUTE
HCV Ab: <0.1 s/co ratio (ref 0.0–0.9)
Hep A IgM: NEGATIVE
Hep B C IgM: NEGATIVE
Hepatitis B Surface Ag: NEGATIVE

## 2017-06-11 LAB — GLUCOSE, CAPILLARY
GLUCOSE-CAPILLARY: 279 mg/dL — AB (ref 65–99)
Glucose-Capillary: 262 mg/dL — ABNORMAL HIGH (ref 65–99)

## 2017-06-11 MED ORDER — METOCLOPRAMIDE HCL 10 MG PO TABS: 10.0000 mg | ORAL_TABLET | Freq: Three times a day (TID) | ORAL | 1 refills | Status: DC

## 2017-06-11 MED ORDER — POLYETHYLENE GLYCOL 3350 17 G PO PACK
17.0000 g | PACK | Freq: Every day | ORAL | Status: DC
  Administered 2017-06-11: 17 g via ORAL
  Filled 2017-06-11: qty 1

## 2017-06-11 MED ORDER — SENNOSIDES-DOCUSATE SODIUM 8.6-50 MG PO TABS
2.0000 | ORAL_TABLET | Freq: Two times a day (BID) | ORAL | Status: DC
  Administered 2017-06-11: 2 via ORAL
  Filled 2017-06-11: qty 2

## 2017-06-11 MED ORDER — OXYCODONE HCL 5 MG PO TABS: 5.0000 mg | ORAL_TABLET | Freq: Four times a day (QID) | ORAL | 0 refills | Status: DC | PRN

## 2017-06-11 MED ORDER — INSULIN ASPART 100 UNIT/ML ~~LOC~~ SOLN: SUBCUTANEOUS | 11 refills | Status: DC

## 2017-06-11 MED ORDER — BISACODYL 5 MG PO TBEC
10.0000 mg | DELAYED_RELEASE_TABLET | Freq: Once | ORAL | Status: AC
  Administered 2017-06-11: 10 mg via ORAL
  Filled 2017-06-11: qty 2

## 2017-06-11 MED ORDER — PEN NEEDLES 31G X 5 MM MISC: 90.0000 | Freq: Three times a day (TID) | 1 refills | Status: DC

## 2017-06-11 MED ORDER — INSULIN ASPART 100 UNIT/ML FLEXPEN: PEN_INJECTOR | SUBCUTANEOUS | 11 refills | Status: DC

## 2017-06-11 MED ORDER — ONDANSETRON HCL 4 MG PO TABS: 4.0000 mg | ORAL_TABLET | Freq: Three times a day (TID) | ORAL | 1 refills | Status: DC | PRN

## 2017-06-11 MED ORDER — PHENOL 1.4 % MT LIQD: 1.0000 | OROMUCOSAL | Status: DC | PRN

## 2017-06-11 MED ORDER — SENNOSIDES-DOCUSATE SODIUM 8.6-50 MG PO TABS
2.0000 | ORAL_TABLET | Freq: Two times a day (BID) | ORAL | 0 refills | Status: DC
Start: 2017-06-11 — End: 2017-07-04

## 2017-06-11 MED ORDER — INSULIN DETEMIR 100 UNIT/ML FLEXPEN: 45.0000 [IU] | PEN_INJECTOR | Freq: Every day | SUBCUTANEOUS | 11 refills | Status: DC

## 2017-06-11 MED ORDER — BLOOD GLUCOSE MONITOR KIT: PACK | 0 refills | Status: DC

## 2017-06-11 NOTE — Progress Notes (Signed)
Discharged home, self care.  

## 2017-06-11 NOTE — Discharge Instructions (Signed)
Diabetes Mellitus and Food It is important for you to manage your blood sugar (glucose) level. Your blood glucose level can be greatly affected by what you eat. Eating healthier foods in the appropriate amounts throughout the day at about the same time each day will help you control your blood glucose level. It can also help slow or prevent worsening of your diabetes mellitus. Healthy eating may even help you improve the level of your blood pressure and reach or maintain a healthy weight. General recommendations for healthful eating and cooking habits include:  Eating meals and snacks regularly. Avoid going long periods of time without eating to lose weight.  Eating a diet that consists mainly of plant-based foods, such as fruits, vegetables, nuts, legumes, and whole grains.  Using low-heat cooking methods, such as baking, instead of high-heat cooking methods, such as deep frying.  Work with your dietitian to make sure you understand how to use the Nutrition Facts information on food labels. How can food affect me? Carbohydrates Carbohydrates affect your blood glucose level more than any other type of food. Your dietitian will help you determine how many carbohydrates to eat at each meal and teach you how to count carbohydrates. Counting carbohydrates is important to keep your blood glucose at a healthy level, especially if you are using insulin or taking certain medicines for diabetes mellitus. Alcohol Alcohol can cause sudden decreases in blood glucose (hypoglycemia), especially if you use insulin or take certain medicines for diabetes mellitus. Hypoglycemia can be a life-threatening condition. Symptoms of hypoglycemia (sleepiness, dizziness, and disorientation) are similar to symptoms of having too much alcohol. If your health care provider has given you approval to drink alcohol, do so in moderation and use the following guidelines:  Women should not have more than one drink per day, and men  should not have more than two drinks per day. One drink is equal to: ? 12 oz of beer. ? 5 oz of wine. ? 1 oz of hard liquor.  Do not drink on an empty stomach.  Keep yourself hydrated. Have water, diet soda, or unsweetened iced tea.  Regular soda, juice, and other mixers might contain a lot of carbohydrates and should be counted.  What foods are not recommended? As you make food choices, it is important to remember that all foods are not the same. Some foods have fewer nutrients per serving than other foods, even though they might have the same number of calories or carbohydrates. It is difficult to get your body what it needs when you eat foods with fewer nutrients. Examples of foods that you should avoid that are high in calories and carbohydrates but low in nutrients include:  Trans fats (most processed foods list trans fats on the Nutrition Facts label).  Regular soda.  Juice.  Candy.  Sweets, such as cake, pie, doughnuts, and cookies.  Fried foods.  What foods can I eat? Eat nutrient-rich foods, which will nourish your body and keep you healthy. The food you should eat also will depend on several factors, including:  The calories you need.  The medicines you take.  Your weight.  Your blood glucose level.  Your blood pressure level.  Your cholesterol level.  You should eat a variety of foods, including:  Protein. ? Lean cuts of meat. ? Proteins low in saturated fats, such as fish, egg whites, and beans. Avoid processed meats.  Fruits and vegetables. ? Fruits and vegetables that may help control blood glucose levels, such as apples,   mangoes, and yams.  Dairy products. ? Choose fat-free or low-fat dairy products, such as milk, yogurt, and cheese.  Grains, bread, pasta, and rice. ? Choose whole grain products, such as multigrain bread, whole oats, and brown rice. These foods may help control blood pressure.  Fats. ? Foods containing healthful fats, such as  nuts, avocado, olive oil, canola oil, and fish.  Does everyone with diabetes mellitus have the same meal plan? Because every person with diabetes mellitus is different, there is not one meal plan that works for everyone. It is very important that you meet with a dietitian who will help you create a meal plan that is just right for you. This information is not intended to replace advice given to you by your health care provider. Make sure you discuss any questions you have with your health care provider. Document Released: 08/31/2005 Document Revised: 05/11/2016 Document Reviewed: 10/31/2013 Elsevier Interactive Patient Education  2017 [employer]   Diabetes Mellitus and Sick Day Management Blood sugar (glucose) can be difficult to control when you are sick. Common illnesses that can cause problems for people with diabetes (diabetes mellitus) include colds, fever, flu (influenza), nausea, vomiting, and diarrhea. These illnesses can cause stress and loss of body fluids (dehydration), and those issues can cause blood glucose levels to increase. Because of this, it is very important to take your insulin and diabetes medicines and eat some form of carbohydrate when you are sick. You should make a plan for days when you are sick (sick day plan) as part of your diabetes management plan. You and your health care provider should make this plan in advance. The following guidelines are intended to help you manage an illness that lasts for about 24 hours or less. Your health care provider may also give you more specific instructions. What do I need to do to manage my blood glucose?  Check your blood glucose every 2-4 hours, or as often as told by your health care provider.  Know your sick day treatment goals. Your target blood glucose levels may be different when you are sick.  If you use insulin, take your usual dose. ? If your blood glucose continues to be too high, you may need to take an additional  insulin dose as told by your health care provider.  If you use oral diabetes medicine, you may need to stop taking it if you are not able to eat or drink normally. Ask your health care provider about whether you need to stop taking these medicines while you are sick.  If you use injectable hormone medicines other than insulin to control your diabetes, ask your health care provider about whether you need to stop taking these medicines while you are sick. What else can I do to manage my diabetes when I am sick? Check your ketones  If you have type 1 diabetes, check your urine ketones every 4 hours.  If you have type 2 diabetes, check your urine ketones as often as told by your health care provider. Drink fluids  Drink enough fluid to keep your urine clear or pale yellow. This is especially important if you have a fever, vomiting, or diarrhea. Those symptoms can lead to dehydration.  Follow any instructions from your health care provider about beverages to avoid. ? Do not drink alcohol, caffeine, or drinks that contain a lot of sugar. Take medicines as directed  Take-over-the-counter and prescription medicines only as told by your health care provider.  Check medicine labels  for added sugars. Some medicines may contain sugar or types of sugars that can raise your blood glucose level. What foods can I eat when I am sick? You need to eat some form of carbohydrates when you are sick. You should eat 45-50 grams (45-50 g) of carbohydrates every 3-4 hours until you feel better. All of the food choices below contain about 15 g of carbohydrates. Plan ahead and keep some of these foods around so you have them if you get sick.  4-6 oz (120-177 mL) carbonated beverage that contains sugar, such as regular (not diet) soda. You may be able to drink carbonated beverages more easily if you open the beverage and let it sit at room temperature for a few minutes before drinking.   of a twin frozen ice  pop.  4 oz (120 g) regular gelatin.  4 oz (120 mL) fruit juice.  4 oz (120 g) ice cream or frozen yogurt.  2 oz (60 g) sherbet.  8 oz (240 mL) clear broth or soup.  4 oz (120 g) regular custard.  4 oz (120 g) regular pudding.  8 oz (240 g) plain yogurt.  1 slice bread or toast.  6 saltine crackers.  5 vanilla wafers.  Questions to ask your health care provider Consider asking the following questions so you know what to do on days when you are sick:  Should I adjust my diabetes medicines?  How often do I need to check my blood glucose?  What supplies do I need to manage my diabetes at home when I am sick?  What number can I call if I have questions?  What foods and drinks should I avoid?  Contact a health care provider if:  You develop symptoms of diabetic ketoacidosis, such as: ? Fatigue. ? Weight loss. ? Excessive thirst. ? Light-headedness. ? Fruity or sweet-smelling breath. ? Excessive urination. ? Vision changes. ? Confusion or irritability. ? Nausea. ? Vomiting. ? Rapid breathing. ? Pain in the abdomen. ? Feeling flushed.  You are unable to drink fluids without vomiting.  You have any of the following for more than 6 hours: ? Nausea. ? Vomiting. ? Diarrhea.  Your blood glucose is at or above 240 mg/dL (63.8 mmol/L), even after you take an additional insulin dose.  You have a change in how you think, feel, or act (mental status).  You develop another serious illness.  You have been sick or have had a fever for 2 days or longer and you are not getting better. Get help right away if:  Your blood glucose is lower than 54 mg/dL (3.0 mmol/L).  You have difficulty breathing.  You have moderate or high ketone levels in your urine.  You used emergency glucagon to treat low blood glucose. Summary  Blood sugar (glucose) can be difficult to control when you are sick. Common illnesses that can cause problems for people with diabetes (diabetes  mellitus) include colds, fever, flu (influenza), nausea, vomiting, and diarrhea.  Illnesses can cause stress and loss of body fluids (dehydration), and those issues can cause blood glucose levels to increase.  Make a plan for days when you are sick (sick day plan) as part of your diabetes management plan. You and your health care provider should make this plan in advance.  It is very important to take your insulin and diabetes medicines and to eat some form of carbohydrate when you are sick.  Contact your health care provider if have problems managing your blood glucose  levels when you are sick, or if you have been sick or had a fever for 2 days or longer and are not getting better. This information is not intended to replace advice given to you by your health care provider. Make sure you discuss any questions you have with your health care provider. Document Released: 12/07/2003 Document Revised: 09/01/2016 Document Reviewed: 09/01/2016 Elsevier Interactive Patient Education  2018 [employer].   Hyperglycemia Hyperglycemia occurs when the level of sugar (glucose) in the blood is too high. Glucose is a type of sugar that provides the body's main source of energy. Certain hormones (insulin and glucagon) control the level of glucose in the blood. Insulin lowers blood glucose, and glucagon increases blood glucose. Hyperglycemia can result from having too little insulin in the bloodstream, or from the body not responding normally to insulin. Hyperglycemia occurs most often in people who have diabetes (diabetes mellitus), but it can happen in people who do not have diabetes. It can develop quickly, and it can be life-threatening if it causes you to become severely dehydrated (diabetic ketoacidosis or hyperglycemic hyperosmolar state). Severe hyperglycemia is a medical emergency. What are the causes? If you have diabetes, hyperglycemia may be caused by:  Diabetes medicine.  Medicines that increase  blood glucose or affect your diabetes control.  Not eating enough, or not eating often enough.  Changes in physical activity level.  Being sick or having an infection.  If you have prediabetes or undiagnosed diabetes:  Hyperglycemia may be caused by those conditions.  If you do not have diabetes, hyperglycemia may be caused by:  Certain medicines, including steroid medicines, beta-blockers, epinephrine, and thiazide diuretics.  Stress.  Serious illness.  Surgery.  Diseases of the pancreas.  Infection.  What increases the risk? Hyperglycemia is more likely to develop in people who have risk factors for diabetes, such as:  Having a family member with diabetes.  Having a gene for type 1 diabetes that is passed from parent to child (inherited).  Living in an area with cold weather conditions.  Exposure to certain viruses.  Certain conditions in which the body's disease-fighting (immune) system attacks itself (autoimmune disorders).  Being overweight or obese.  Having an inactive (sedentary) lifestyle.  Having been diagnosed with insulin resistance.  Having a history of prediabetes, gestational diabetes, or polycystic ovarian syndrome (PCOS).  Being of American-Indian, African-American, Hispanic/Latino, or Asian/Pacific Islander descent.  What are the signs or symptoms? Hyperglycemia may not cause any symptoms. If you do have symptoms, they may include early warning signs, such as:  Increased thirst.  Hunger.  Feeling very tired.  Needing to urinate more often than usual.  Blurry vision.  Other symptoms may develop if hyperglycemia gets worse, such as:  Dry mouth.  Loss of appetite.  Fruity-smelling breath.  Weakness.  Unexpected or rapid weight gain or weight loss.  Tingling or numbness in the hands or feet.  Headache.  Skin that does not quickly return to normal after being lightly pinched and released (poor skin turgor).  Abdominal  pain.  Cuts or bruises that are slow to heal.  How is this diagnosed? Hyperglycemia is diagnosed with a blood test to measure your blood glucose level. This blood test is usually done while you are having symptoms. Your health care provider may also do a physical exam and review your medical history. You may have more tests to determine the cause of your hyperglycemia, such as:  A fasting blood glucose (FBG) test. You will not be  allowed to eat (you will fast) for at least 8 hours before a blood sample is taken.  An A1c (hemoglobin A1c) blood test. This provides information about blood glucose control over the previous 2-3 months.  An oral glucose tolerance test (OGTT). This measures your blood glucose at two times: ? After fasting. This is your baseline blood glucose level. ? Two hours after drinking a beverage that contains glucose.  How is this treated? Treatment depends on the cause of your hyperglycemia. Treatment may include:  Taking medicine to regulate your blood glucose levels. If you take insulin or other diabetes medicines, your medicine or dosage may be adjusted.  Lifestyle changes, such as exercising more, eating healthier foods, or losing weight.  Treating an illness or infection, if this caused your hyperglycemia.  Checking your blood glucose more often.  Stopping or reducing steroid medicines, if these caused your hyperglycemia.  If your hyperglycemia becomes severe and it results in hyperglycemic hyperosmolar state, you must be hospitalized and given IV fluids. Follow these instructions at home: General instructions  Take over-the-counter and prescription medicines only as told by your health care provider.  Do not use any products that contain nicotine or tobacco, such as cigarettes and e-cigarettes. If you need help quitting, ask your health care provider.  Limit alcohol intake to no more than 1 drink per day for nonpregnant women and 2 drinks per day for men.  One drink equals 12 oz of beer, 5 oz of wine, or 1 oz of hard liquor.  Learn to manage stress. If you need help with this, ask your health care provider.  Keep all follow-up visits as told by your health care provider. This is important. Eating and drinking  Maintain a healthy weight.  Exercise regularly, as directed by your health care provider.  Stay hydrated, especially when you exercise, get sick, or spend time in hot temperatures.  Eat healthy foods, such as: ? Lean proteins. ? Complex carbohydrates. ? Fresh fruits and vegetables. ? Low-fat dairy products. ? Healthy fats.  Drink enough fluid to keep your urine clear or pale yellow. If you have diabetes:   Make sure you know the symptoms of hyperglycemia.  Follow your diabetes management plan, as told by your health care provider. Make sure you: ? Take your insulin and medicines as directed. ? Follow your exercise plan. ? Follow your meal plan. Eat on time, and do not skip meals. ? Check your blood glucose as often as directed. Make sure to check your blood glucose before and after exercise. If you exercise longer or in a different way than usual, check your blood glucose more often. ? Follow your sick day plan whenever you cannot eat or drink normally. Make this plan in advance with your health care provider.  Share your diabetes management plan with people in your workplace, school, and household.  Check your urine for ketones when you are ill and as told by your health care provider.  Carry a medical alert card or wear medical alert jewelry. Contact a health care provider if:  Your blood glucose is at or above 240 mg/dL (16.1 mmol/L) for 2 days in a row.  You have problems keeping your blood glucose in your target range.  You have frequent episodes of hyperglycemia. Get help right away if:  You have difficulty breathing.  You have a change in how you think, feel, or act (mental status).  You have nausea or  vomiting that does not go away. These  symptoms may represent a serious problem that is an emergency. Do not wait to see if the symptoms will go away. Get medical help right away. Call your local emergency services (911 in the [place] Do not drive yourself to the hospital. Summary  Hyperglycemia occurs when the level of sugar (glucose) in the blood is too high.  Hyperglycemia is diagnosed with a blood test to measure your blood glucose level. This blood test is usually done while you are having symptoms. Your health care provider may also do a physical exam and review your medical history.  If you have diabetes, follow your diabetes management plan as told by your health care provider.  Contact your health care provider if you have problems keeping your blood glucose in your target range. This information is not intended to replace advice given to you by your health care provider. Make sure you discuss any questions you have with your health care provider. Document Released: 05/30/2001 Document Revised: 08/21/2016 Document Reviewed: 08/21/2016 Elsevier Interactive Patient Education  2018 [employer].   Diabetic Ketoacidosis Diabetic ketoacidosis is a life-threatening complication of diabetes. If it is not treated, it can cause severe dehydration and organ damage and can lead to a coma or death. What are the causes? This condition develops when there is not enough of the hormone insulin in the body. Insulin helps the body to break down sugar for energy. Without insulin, the body cannot break down sugar, so it breaks down fats instead. This leads to the production of acids that are called ketones. Ketones are poisonous at high levels. This condition can be triggered by:  Stress on the body that is brought on by an illness.  Medicines that raise blood glucose levels.  Not taking diabetes medicine.  What are the signs or symptoms? Symptoms of this condition include:  Fatigue.  Weight  loss.  Excessive thirst.  Light-headedness.  Fruity or sweet-smelling breath.  Excessive urination.  Vision changes.  Confusion or irritability.  Nausea.  Vomiting.  Rapid breathing.  Abdominal pain.  Feeling flushed.  How is this diagnosed? This condition is diagnosed based on a medical history, a physical exam, and blood tests. You may also have a urine test that checks for ketones. How is this treated? This condition may be treated with:  Fluid replacement. This may be done to correct dehydration.  Insulin injections. These may be given through the skin or through an IV tube.  Electrolyte replacement. Electrolytes, such as potassium and sodium, may be given in pill form or through an IV tube.  Antibiotic medicines. These may be prescribed if your condition was caused by an infection.  Follow these instructions at home: Eating and drinking  Drink enough fluids to keep your urine clear or pale yellow.  If you cannot eat, alternate between drinking fluids with sugar (such as juice) and salty fluids (such as broth or bouillon).  If you can eat, follow your usual diet and drink sugar-free liquids, such as water. Other Instructions   Take insulin as directed by your health care provider. Do not skip insulin injections. Do not use expired insulin.  If your blood sugar is over 240 mg/dL, monitor your urine ketones every 4-6 hours.  If you were prescribed an antibiotic medicine, finish all of it even if you start to feel better.  Rest and exercise only as directed by your health care provider.  If you get sick, call your health care provider and begin treatment quickly. Your body often  needs extra insulin to fight an illness.  Check your blood glucose levels regularly. If your blood glucose is high, drink plenty of fluids. This helps to flush out ketones. Contact a health care provider if:  Your blood glucose level is too high or too low.  You have ketones in  your urine.  You have a fever.  You cannot eat.  You cannot tolerate fluids.  You have been vomiting for more than 2 hours.  You continue to have symptoms of this condition.  You develop new symptoms. Get help right away if:  Your blood glucose levels continue to be high (elevated).  Your monitor reads high even when you are taking insulin.  You faint.  You have chest pain.  You have trouble breathing.  You have a sudden, severe headache.  You have sudden weakness in one arm or one leg.  You have sudden trouble speaking or swallowing.  You have vomiting or diarrhea that gets worse after 3 hours.  You feel severely fatigued.  You have trouble thinking.  You have abdominal pain.  You are severely dehydrated. Symptoms of severe dehydration include: ? Extreme thirst. ? Dry mouth. ? Blue lips. ? Cold hands and feet. ? Rapid breathing. This information is not intended to replace advice given to you by your health care provider. Make sure you discuss any questions you have with your health care provider. Document Released: 12/01/2000 Document Revised: 05/11/2016 Document Reviewed: 11/11/2014 Elsevier Interactive Patient Education  2017 [employer].

## 2017-06-11 NOTE — Progress Notes (Addendum)
NP on call Sch[facility] notified of pt c/o of not having a BM since 06/19. Pt also c/o of having a sore throat & feeling as if solid foods are stuck in his throat & that it requires him to drink liquids to get it down. Pt has already has a SLP consult which other than some inflammation of the oropharynx was noted to be normal. .Will continue to monitor the pt. Sanda Linger, RN   New orders given for dulcolax once & phenol spray.

## 2017-06-11 NOTE — Progress Notes (Signed)
Inpatient Diabetes Program Recommendations  AACE/ADA: New Consensus Statement on Inpatient Glycemic Control (2015)  Target Ranges:  Prepandial:   less than 140 mg/dL      Peak postprandial:   less than 180 mg/dL (1-2 hours)      Critically ill patients:  140 - 180 mg/dL   Lab Results  Component Value Date   GLUCAP 262 (H) 06/11/2017   HGBA1C 13.9 (H) 01/22/2016    Review of Glycemic ControlResults for KALAI, BACA (MRN [id number]) as of 06/11/2017 14:04  Ref. Range 06/10/2017 16:24 06/10/2017 21:05 06/11/2017 07:48 06/11/2017 11:33  Glucose-Capillary Latest Ref Range: 65 - 99 mg/dL 889 (H) 169 (H) 450 (H) 262 (H)   Diabetes history: DM1 Outpatient Diabetes medications: Lantus 45 units hs + Humalog 10 units tid meal coverage (D/C'd previously on 70/30 48 units bid on 12/27/15 Current orders for Inpatient glycemic control: Levemir 40 units + Novolog correction 0-15 units tid + 0-5 units hs, and Novolog 3 units tid with meals Inpatient Diabetes Program Recommendations:    Consider increasing Levemir to 50 units daily and increase meal coverage to 5 units tid with meals.   Thanks, Beryl Meager, RN, BC-ADM Inpatient Diabetes Coordinator Pager [phone] ([id number]-[id number])

## 2017-06-11 NOTE — Care Management Important Message (Signed)
Important Message  Patient Details  Name: [name] MRN: [id number] Date of Birth: 10-05-1986   Medicare Important Message Given:  Yes    Jeimy Bickert Abena 06/11/2017, 11:11 AM

## 2017-06-11 NOTE — Discharge Summary (Signed)
Physician Discharge Summary  WYLEE DORANTES HWT:[id number] DOB: 08-22-1986 DOA: 06/06/2017  PCP: Fleet Contras, MD  Admit date: 06/06/2017 Discharge date: 06/11/2017  Admitted From: Home.  Disposition:  Home.   Recommendations for Outpatient Follow-up:  1. Follow up with PCP in 1-2 weeks 2. Please obtain BMP/CBC in one week.   Discharge Condition: stable.  CODE STATUS: full code. Diet recommendation: Heart Healthy    Brief/Interim Summary:  30 y.o.malewith medical history significant of DM type I, HTN, GERD, schizophrenia, anxiety,depression, substance abuse, and chronic pain; who presents with complaints ofnausea, vomiting, and chest pain. He was just recently admitted under the critical care service at [facility] 3 days ago. He left AMA as he needed to go secure his new living arrangement at the hotel. After leaving patient notes that he was unable to fill the prescriptions for Lantus or Novolag vials as these were not covered by his insurance. He was admitted for recurrent symptoms of nausea and vomiting, chest pressure. [name]   Discharge Diagnoses:  Principal Problem:   DKA, type 1 ([name]) Active Problems:   Depression   Tobacco use disorder   SIRS (systemic inflammatory response syndrome) ([name])   Chest pain   GERD (gastroesophageal reflux disease)  DKA:   secondary to Non compliance to insulin.  His gap is closed.  Bicarb is normal.  Patient is back on his insulin regimen. With some changes.  CBG (last 3)   Recent Labs  06/10/17 2105 06/11/17 0748 06/11/17 1133  GLUCAP 190* 279* 262*         Hypokalemia: replaced.    Acute renal failure:   - possibly dehydration and DKA.  - creatinine normalized with hydration.    Mild elevation of the troponins:  Un clear etiology.  Echocardiogram ordered. Not significant.  EKG is normal sinus rhythm.      Fever , chills, leukocytosis. Londell Moh ua is negative. CXR is negative.  Blood cultures ordered  and have been negative.  He was empirically started on IV vancomycin and zosyn. Antibiotics discontinued, monitored off antibiotics. He remains asymptomatic.       Depression: no suicidal ideations.   Tobacco use:  Nicotine patch used.   Generalized body aches:  Requesting pain meds.  Tramadol didn't work.  Added oxycodone.   Nausea, vomiting: no abd pain.  Possibly from gastroparesis.  Added phenergan . Liver function tests slightly elevated enzymes.  Lipase normal.  Sent for hepatitis panel and pending, recommended outpatient follow up with PCP.    Difficulty swallowing probably from vomiting. SLP eval normal.    Chest pain on deep breathing:  Probably pleuritis.  CT angio of the chest, neg for PE.  5.7 MM nodule in the right lung, since he continues to smoke recommend outpatient CT in one year for follow up .     Discharge Instructions  Discharge Instructions    Diet - low sodium heart healthy    Complete by:  As directed    Diet Carb Modified    Complete by:  As directed    Discharge instructions    Complete by:  As directed    Follow up with PCP in one week.   Discharge instructions    Complete by:  As directed    Please follow up with PCP in one week.     Allergies as of 06/11/2017      Reactions   Sulfa Antibiotics Other (See Comments)   Unknown - Childhood      Medication  List    STOP taking these medications   insulin glargine 100 UNIT/ML injection Commonly known as:  LANTUS   insulin lispro 100 UNIT/ML injection Commonly known as:  HUMALOG   QUEtiapine 300 MG tablet Commonly known as:  SEROQUEL     TAKE these medications   blood glucose meter kit and supplies Kit Dispense based on patient and insurance preference. Use up to four times daily as directed. (FOR ICD-9 250.00, 250.01).   DOXEPIN HCL EX Take 100 mg by mouth daily.   feeding supplement (GLUCERNA SHAKE) Liqd Take 237 mLs by mouth 3 (three) times daily between  meals.   insulin aspart 100 UNIT/ML injection Commonly known as:  novoLOG CBG 70 - 120: 0 units CBG 121 - 150: 2 units CBG 151 - 200: 3 units CBG 201 - 250: 5 units CBG 251 - 300: 8 units CBG 301 - 350: 11 units CBG 351 - 400: 15 units What changed:  how much to take  how to take this  when to take this  additional instructions   insulin aspart 100 UNIT/ML FlexPen Commonly known as:  NOVOLOG FLEXPEN CBG 70 - 120: 0 units CBG 121 - 150: 2 units CBG 151 - 200: 3 units CBG 201 - 250: 5 units CBG 251 - 300: 8 units CBG 301 - 350: 11 units CBG 351 - 400: 15 units What changed:  You were already taking a medication with the same name, and this prescription was added. Make sure you understand how and when to take each.   Insulin Detemir 100 UNIT/ML Pen Commonly known as:  LEVEMIR FLEXPEN Inject 45 Units into the skin daily at 10 pm. What changed:  how much to take  when to take this   metoCLOPramide 10 MG tablet Commonly known as:  REGLAN Take 1 tablet (10 mg total) by mouth 3 (three) times daily with meals.   ondansetron 4 MG tablet Commonly known as:  ZOFRAN Take 1 tablet (4 mg total) by mouth every 8 (eight) hours as needed for nausea or vomiting.   pantoprazole 40 MG tablet Commonly known as:  PROTONIX Take 1 tablet (40 mg total) by mouth daily.   Pen Needles 31G X 5 MM Misc 90 each by Does not apply route 4 (four) times daily -  before meals and at bedtime.   senna-docusate 8.6-50 MG tablet Commonly known as:  Senokot-S Take 2 tablets by mouth 2 (two) times daily.   traZODone 150 MG tablet Commonly known as:  DESYREL Take 300 mg by mouth at bedtime.      Follow-up Information    Nolene Ebbs, MD. Schedule an appointment as soon as possible for a visit in 1 week(s).   Specialty:  Internal Medicine Contact information: [place] [address] [phone]          Allergies  Allergen Reactions  . Sulfa Antibiotics Other (See Comments)     Unknown - Childhood    Consultations:  None.    Procedures/Studies: Dg Chest 2 View  Result Date: 06/06/2017 CLINICAL DATA:  31 year old male with chest pain. EXAM: CHEST  2 VIEW COMPARISON:  None. FINDINGS: The heart size and mediastinal contours are within normal limits. Both lungs are clear. The visualized skeletal structures are unremarkable. IMPRESSION: No active cardiopulmonary disease. Electronically Signed   By: Anner Crete M.D.   On: 06/06/2017 19:00   Ct Angio Chest Pe W Or Wo Contrast  Result Date: 06/10/2017 CLINICAL DATA:  Sternal chest  pain for 3 or 4 days. EXAM: CT ANGIOGRAPHY CHEST WITH CONTRAST TECHNIQUE: Multidetector CT imaging of the chest was performed using the standard protocol during bolus administration of intravenous contrast. Multiplanar CT image reconstructions and MIPs were obtained to evaluate the vascular anatomy. CONTRAST:  100 mL of Isovue 370 COMPARISON:  Chest x-ray June 06, 2017 FINDINGS: Cardiovascular: No coronary artery calcifications are identified. The heart is normal. The thoracic aorta is non aneurysmal with no atherosclerosis or dissection. No pulmonary emboli are identified. Mediastinum/Nodes: Mild gynecomastia. The thyroid is normal. The esophagus is unremarkable. No adenopathy. Lungs/Pleura: Central airways are normal. No pneumothorax. Small bilateral pleural effusions with associated atelectasis are identified. There is a 5.7 mm nodule in the right lung on series 7, image 68. No other nodules. No masses. No suspicious infiltrates. Upper Abdomen: Limited views the upper abdomen are unremarkable. Musculoskeletal: No chest wall abnormality. No acute or significant osseous findings. Review of the MIP images confirms the above findings. IMPRESSION: 1. Small bilateral pleural effusions with compressive atelectasis. 2. No pulmonary emboli are identified. 3. No cause for the patient's symptoms is identified. 4. 5.7 mm nodule in the right lung. No  follow-up needed if patient is low-risk. Non-contrast chest CT can be considered in 12 months if patient is high-risk. This recommendation follows the consensus statement: Guidelines for Management of Incidental Pulmonary Nodules Detected on CT Images: From the Fleischner Society 2017; Radiology 2017; 284:228-243. Electronically Signed   By: Dorise Bullion III M.D   On: 06/10/2017 15:46   Dg Chest Port 1 View  Result Date: 06/04/2017 CLINICAL DATA:  DKA EXAM: PORTABLE CHEST 1 VIEW COMPARISON:  06/03/2017 FINDINGS: The heart size and mediastinal contours are within normal limits. Both lungs are clear. The visualized skeletal structures are unremarkable. IMPRESSION: No active disease. Electronically Signed   By: Inez Catalina M.D.   On: 06/04/2017 11:13   Dg Chest Port 1 View  Result Date: 06/03/2017 CLINICAL DATA:  Acute onset of diabetic ketoacidosis. Initial encounter. EXAM: PORTABLE CHEST 1 VIEW COMPARISON:  Chest radiograph performed 11/05/2015 FINDINGS: The lungs are well-aerated. Minimal bibasilar atelectasis is noted. There is no evidence of pleural effusion or pneumothorax. The cardiomediastinal silhouette is within normal limits. No acute osseous abnormalities are seen. IMPRESSION: Minimal bibasilar atelectasis noted.  Lungs otherwise clear. Electronically Signed   By: Garald Balding M.D.   On: 06/03/2017 04:24    (echocardiogram.    Subjective: No chest pain, nausea, vomiting.   Discharge Exam: Vitals:   06/10/17 1427 06/11/17 0500  BP: (!) 146/91 (!) 133/91  Pulse: 87 85  Resp: 18 16  Temp: 98.6 F (37 C) 98.2 F (36.8 C)   Vitals:   06/09/17 2003 06/10/17 0500 06/10/17 1427 06/11/17 0500  BP: 123/80 136/86 (!) 146/91 (!) 133/91  Pulse: 85 95 87 85  Resp: '16 16 18 16  '$ Temp: 98.1 F (36.7 C) 98.6 F (37 C) 98.6 F (37 C) 98.2 F (36.8 C)  TempSrc: Oral Oral Oral Oral  SpO2: 96% 95% 98% 96%  Weight:    93.7 kg (206 lb 9.6 oz)  Height:        General: Pt is alert,  awake, not in acute distress Cardiovascular: RRR, S1/S2 +, no rubs, no gallops Respiratory: CTA bilaterally, no wheezing, no rhonchi Abdominal: Soft, NT, ND, bowel sounds + Extremities: no edema, no cyanosis    The results of significant diagnostics from this hospitalization (including imaging, microbiology, ancillary and laboratory) are listed below for reference.  Microbiology: Recent Results (from the past 240 hour(s))  MRSA PCR Screening     Status: None   Collection Time: 06/03/17  8:17 AM  Result Value Ref Range Status   MRSA by PCR NEGATIVE NEGATIVE Final    Comment:        The GeneXpert MRSA Assay (FDA approved for NASAL specimens only), is one component of a comprehensive MRSA colonization surveillance program. It is not intended to diagnose MRSA infection nor to guide or monitor treatment for MRSA infections.   Culture, blood (routine x 2)     Status: None   Collection Time: 06/04/17 11:29 AM  Result Value Ref Range Status   Specimen Description BLOOD LEFT ANTECUBITAL  Final   Special Requests IN PEDIATRIC BOTTLE Blood Culture adequate volume  Final   Culture NO GROWTH 5 DAYS  Final   Report Status 06/09/2017 FINAL  Final  Culture, blood (routine x 2)     Status: None   Collection Time: 06/04/17 11:33 AM  Result Value Ref Range Status   Specimen Description BLOOD LEFT HAND  Final   Special Requests IN PEDIATRIC BOTTLE Blood Culture adequate volume  Final   Culture NO GROWTH 5 DAYS  Final   Report Status 06/09/2017 FINAL  Final  Culture, group A strep     Status: None   Collection Time: 06/04/17  2:43 PM  Result Value Ref Range Status   Specimen Description THROAT  Final   Special Requests Normal  Final   Culture NO GROUP A STREP (S.PYOGENES) ISOLATED  Final   Report Status 06/07/2017 FINAL  Final  Rapid strep screen (not at [facility])     Status: None   Collection Time: 06/06/17 12:24 AM  Result Value Ref Range Status   Streptococcus, Group A Screen  (Direct) NEGATIVE NEGATIVE Final    Comment: (NOTE) A Rapid Antigen test may result negative if the antigen level in the sample is below the detection level of this test. The FDA has not cleared this test as a stand-alone test therefore the rapid antigen negative result has reflexed to a Group A Strep culture.   Culture, group A strep     Status: None   Collection Time: 06/06/17 12:24 AM  Result Value Ref Range Status   Specimen Description THROAT  Final   Special Requests NONE Reflexed from [id number]  Final   Culture NO GROUP A STREP (S.PYOGENES) ISOLATED  Final   Report Status 06/09/2017 FINAL  Final  MRSA PCR Screening     Status: None   Collection Time: 06/06/17 10:52 PM  Result Value Ref Range Status   MRSA by PCR NEGATIVE NEGATIVE Final    Comment:        The GeneXpert MRSA Assay (FDA approved for NASAL specimens only), is one component of a comprehensive MRSA colonization surveillance program. It is not intended to diagnose MRSA infection nor to guide or monitor treatment for MRSA infections.   Culture, blood (routine x 2)     Status: None (Preliminary result)   Collection Time: 06/06/17 11:11 PM  Result Value Ref Range Status   Specimen Description BLOOD RIGHT ARM  Final   Special Requests   Final    BOTTLES DRAWN AEROBIC ONLY Blood Culture adequate volume   Culture NO GROWTH 4 DAYS  Final   Report Status PENDING  Incomplete  Culture, blood (routine x 2)     Status: None (Preliminary result)   Collection Time: 06/06/17 11:19 PM  Result Value Ref  Range Status   Specimen Description BLOOD RIGHT HAND  Final   Special Requests   Final    BOTTLES DRAWN AEROBIC ONLY Blood Culture adequate volume   Culture NO GROWTH 4 DAYS  Final   Report Status PENDING  Incomplete     Labs: BNP (last 3 results) No results for input(s): BNP in the last 8760 hours. Basic Metabolic Panel:  Recent Labs Lab 06/05/17 0531  06/06/17 2219 06/07/17 0226 06/07/17 0757 06/08/17 0337  06/10/17 1118  NA 138  < > 138 138 142 140 139  K 3.5  < > 3.8 3.0* 3.0* 3.7 4.1  CL 102  < > 102 106 108 106 102  CO2 22  < > '22 24 27 30 30  '$ GLUCOSE 356*  < > 300* 222* 126* 268* 266*  BUN 13  < > '11 9 7 8 10  '$ CREATININE 1.28*  < > 1.08 0.72 0.70 0.65 0.74  CALCIUM 8.4*  < > 9.1 8.3* 8.5* 8.4* 8.9  MG 2.7*  --   --   --   --   --   --   PHOS 1.6*  --   --   --   --   --   --   < > = values in this interval not displayed. Liver Function Tests:  Recent Labs Lab 06/09/17 1827  AST 110*  ALT 89*  ALKPHOS 73  BILITOT 0.3  PROT 5.0*  ALBUMIN 2.8*    Recent Labs Lab 06/09/17 1827  LIPASE 20   No results for input(s): AMMONIA in the last 168 hours. CBC:  Recent Labs Lab 06/05/17 0531 06/06/17 1503 06/06/17 2312 06/07/17 0856  WBC 7.1 8.1  --  10.8*  HGB 10.8* 12.2* 11.3* 10.6*  HCT 30.9* 34.6* 32.7* 31.3*  MCV 82.6 82.4  --  81.7  PLT 202 246  --  203   Cardiac Enzymes:  Recent Labs Lab 06/06/17 2219 06/07/17 0226 06/07/17 0757  TROPONINI 0.03* 0.03* 0.04*   BNP: Invalid input(s): POCBNP CBG:  Recent Labs Lab 06/10/17 1117 06/10/17 1624 06/10/17 2105 06/11/17 0748 06/11/17 1133  GLUCAP 278* 248* 190* 279* 262*   D-Dimer No results for input(s): DDIMER in the last 72 hours. Hgb A1c No results for input(s): HGBA1C in the last 72 hours. Lipid Profile No results for input(s): CHOL, HDL, LDLCALC, TRIG, CHOLHDL, LDLDIRECT in the last 72 hours. Thyroid function studies No results for input(s): TSH, T4TOTAL, T3FREE, THYROIDAB in the last 72 hours.  Invalid input(s): FREET3 Anemia work up No results for input(s): VITAMINB12, FOLATE, FERRITIN, TIBC, IRON, RETICCTPCT in the last 72 hours. Urinalysis    Component Value Date/Time   COLORURINE STRAW (A) 06/06/2017 1914   APPEARANCEUR CLEAR 06/06/2017 1914   LABSPEC 1.029 06/06/2017 1914   PHURINE 6.0 06/06/2017 1914   GLUCOSEU >=500 (A) 06/06/2017 1914   HGBUR SMALL (A) 06/06/2017 [place] NEGATIVE 06/06/2017 1914   BILIRUBINUR negative 03/29/2015 1114   KETONESUR 80 (A) 06/06/2017 1914   PROTEINUR NEGATIVE 06/06/2017 1914   UROBILINOGEN 0.2 05/17/2015 1810   NITRITE NEGATIVE 06/06/2017 1914   LEUKOCYTESUR NEGATIVE 06/06/2017 1914   Sepsis Labs Invalid input(s): PROCALCITONIN,  WBC,  LACTICIDVEN Microbiology Recent Results (from the past 240 hour(s))  MRSA PCR Screening     Status: None   Collection Time: 06/03/17  8:17 AM  Result Value Ref Range Status   MRSA by PCR NEGATIVE NEGATIVE Final    Comment:  The GeneXpert MRSA Assay (FDA approved for NASAL specimens only), is one component of a comprehensive MRSA colonization surveillance program. It is not intended to diagnose MRSA infection nor to guide or monitor treatment for MRSA infections.   Culture, blood (routine x 2)     Status: None   Collection Time: 06/04/17 11:29 AM  Result Value Ref Range Status   Specimen Description BLOOD LEFT ANTECUBITAL  Final   Special Requests IN PEDIATRIC BOTTLE Blood Culture adequate volume  Final   Culture NO GROWTH 5 DAYS  Final   Report Status 06/09/2017 FINAL  Final  Culture, blood (routine x 2)     Status: None   Collection Time: 06/04/17 11:33 AM  Result Value Ref Range Status   Specimen Description BLOOD LEFT HAND  Final   Special Requests IN PEDIATRIC BOTTLE Blood Culture adequate volume  Final   Culture NO GROWTH 5 DAYS  Final   Report Status 06/09/2017 FINAL  Final  Culture, group A strep     Status: None   Collection Time: 06/04/17  2:43 PM  Result Value Ref Range Status   Specimen Description THROAT  Final   Special Requests Normal  Final   Culture NO GROUP A STREP (S.PYOGENES) ISOLATED  Final   Report Status 06/07/2017 FINAL  Final  Rapid strep screen (not at [facility])     Status: None   Collection Time: 06/06/17 12:24 AM  Result Value Ref Range Status   Streptococcus, Group A Screen (Direct) NEGATIVE NEGATIVE Final    Comment: (NOTE) A  Rapid Antigen test may result negative if the antigen level in the sample is below the detection level of this test. The FDA has not cleared this test as a stand-alone test therefore the rapid antigen negative result has reflexed to a Group A Strep culture.   Culture, group A strep     Status: None   Collection Time: 06/06/17 12:24 AM  Result Value Ref Range Status   Specimen Description THROAT  Final   Special Requests NONE Reflexed from [id number]  Final   Culture NO GROUP A STREP (S.PYOGENES) ISOLATED  Final   Report Status 06/09/2017 FINAL  Final  MRSA PCR Screening     Status: None   Collection Time: 06/06/17 10:52 PM  Result Value Ref Range Status   MRSA by PCR NEGATIVE NEGATIVE Final    Comment:        The GeneXpert MRSA Assay (FDA approved for NASAL specimens only), is one component of a comprehensive MRSA colonization surveillance program. It is not intended to diagnose MRSA infection nor to guide or monitor treatment for MRSA infections.   Culture, blood (routine x 2)     Status: None (Preliminary result)   Collection Time: 06/06/17 11:11 PM  Result Value Ref Range Status   Specimen Description BLOOD RIGHT ARM  Final   Special Requests   Final    BOTTLES DRAWN AEROBIC ONLY Blood Culture adequate volume   Culture NO GROWTH 4 DAYS  Final   Report Status PENDING  Incomplete  Culture, blood (routine x 2)     Status: None (Preliminary result)   Collection Time: 06/06/17 11:19 PM  Result Value Ref Range Status   Specimen Description BLOOD RIGHT HAND  Final   Special Requests   Final    BOTTLES DRAWN AEROBIC ONLY Blood Culture adequate volume   Culture NO GROWTH 4 DAYS  Final   Report Status PENDING  Incomplete     Time coordinating  discharge: Over 30 minutes  SIGNED:   Hosie Poisson, MD  [facility] 06/11/2017, 11:52 AM Pager   If 7PM-7AM, please contact night-coverage [email or web address] Password [email or web address]

## 2017-06-12 LAB — CULTURE, BLOOD (ROUTINE X 2)
Culture: NO GROWTH
Culture: NO GROWTH
SPECIAL REQUESTS: ADEQUATE
Special Requests: ADEQUATE

## 2017-06-15 ENCOUNTER — Encounter (HOSPITAL_COMMUNITY): Payer: Self-pay | Admitting: Emergency Medicine

## 2017-06-15 ENCOUNTER — Inpatient Hospital Stay (HOSPITAL_COMMUNITY)
Admission: EM | Admit: 2017-06-15 | Discharge: 2017-06-19 | DRG: 638 | Disposition: A | Discharge status: Home / Self Care | Payer: Medicare Other | Attending: Internal Medicine | Admitting: Internal Medicine

## 2017-06-15 DIAGNOSIS — Z833 Family history of diabetes mellitus: Secondary | ICD-10-CM

## 2017-06-15 DIAGNOSIS — E1043 Type 1 diabetes mellitus with diabetic autonomic (poly)neuropathy: Secondary | ICD-10-CM | POA: Diagnosis present

## 2017-06-15 DIAGNOSIS — E860 Dehydration: Secondary | ICD-10-CM | POA: Diagnosis present

## 2017-06-15 DIAGNOSIS — K219 Gastro-esophageal reflux disease without esophagitis: Secondary | ICD-10-CM | POA: Diagnosis not present

## 2017-06-15 DIAGNOSIS — Z794 Long term (current) use of insulin: Secondary | ICD-10-CM

## 2017-06-15 DIAGNOSIS — Z882 Allergy status to sulfonamides status: Secondary | ICD-10-CM | POA: Diagnosis not present

## 2017-06-15 DIAGNOSIS — F321 Major depressive disorder, single episode, moderate: Secondary | ICD-10-CM | POA: Diagnosis not present

## 2017-06-15 DIAGNOSIS — F259 Schizoaffective disorder, unspecified: Secondary | ICD-10-CM | POA: Diagnosis present

## 2017-06-15 DIAGNOSIS — Z91199 Patient's noncompliance with other medical treatment and regimen due to unspecified reason: Secondary | ICD-10-CM

## 2017-06-15 DIAGNOSIS — R6510 Systemic inflammatory response syndrome (SIRS) of non-infectious origin without acute organ dysfunction: Secondary | ICD-10-CM | POA: Diagnosis present

## 2017-06-15 DIAGNOSIS — G43909 Migraine, unspecified, not intractable, without status migrainosus: Secondary | ICD-10-CM | POA: Diagnosis present

## 2017-06-15 DIAGNOSIS — I10 Essential (primary) hypertension: Secondary | ICD-10-CM | POA: Diagnosis present

## 2017-06-15 DIAGNOSIS — R112 Nausea with vomiting, unspecified: Secondary | ICD-10-CM | POA: Diagnosis present

## 2017-06-15 DIAGNOSIS — E870 Hyperosmolality and hypernatremia: Secondary | ICD-10-CM | POA: Diagnosis present

## 2017-06-15 DIAGNOSIS — E1069 Type 1 diabetes mellitus with other specified complication: Secondary | ICD-10-CM | POA: Diagnosis not present

## 2017-06-15 DIAGNOSIS — G8929 Other chronic pain: Secondary | ICD-10-CM | POA: Diagnosis present

## 2017-06-15 DIAGNOSIS — E1110 Type 2 diabetes mellitus with ketoacidosis without coma: Secondary | ICD-10-CM | POA: Diagnosis present

## 2017-06-15 DIAGNOSIS — R109 Unspecified abdominal pain: Secondary | ICD-10-CM | POA: Diagnosis present

## 2017-06-15 DIAGNOSIS — F17200 Nicotine dependence, unspecified, uncomplicated: Secondary | ICD-10-CM | POA: Diagnosis present

## 2017-06-15 DIAGNOSIS — F329 Major depressive disorder, single episode, unspecified: Secondary | ICD-10-CM | POA: Diagnosis present

## 2017-06-15 DIAGNOSIS — R739 Hyperglycemia, unspecified: Secondary | ICD-10-CM | POA: Diagnosis not present

## 2017-06-15 DIAGNOSIS — Z9119 Patient's noncompliance with other medical treatment and regimen: Secondary | ICD-10-CM

## 2017-06-15 DIAGNOSIS — K920 Hematemesis: Secondary | ICD-10-CM | POA: Diagnosis not present

## 2017-06-15 DIAGNOSIS — A419 Sepsis, unspecified organism: Secondary | ICD-10-CM | POA: Diagnosis present

## 2017-06-15 DIAGNOSIS — E1065 Type 1 diabetes mellitus with hyperglycemia: Secondary | ICD-10-CM | POA: Diagnosis present

## 2017-06-15 DIAGNOSIS — F32A Depression, unspecified: Secondary | ICD-10-CM | POA: Diagnosis present

## 2017-06-15 DIAGNOSIS — K3184 Gastroparesis: Secondary | ICD-10-CM | POA: Diagnosis present

## 2017-06-15 DIAGNOSIS — Z9114 Patient's other noncompliance with medication regimen: Secondary | ICD-10-CM

## 2017-06-15 DIAGNOSIS — F17210 Nicotine dependence, cigarettes, uncomplicated: Secondary | ICD-10-CM | POA: Diagnosis present

## 2017-06-15 DIAGNOSIS — E1010 Type 1 diabetes mellitus with ketoacidosis without coma: Principal | ICD-10-CM | POA: Diagnosis present

## 2017-06-15 LAB — COMPREHENSIVE METABOLIC PANEL
ALK PHOS: 108 U/L (ref 38–126)
ALT: 80 U/L — AB (ref 17–63)
AST: 26 U/L (ref 15–41)
Albumin: 4.1 g/dL (ref 3.5–5.0)
Anion gap: 21 — ABNORMAL HIGH (ref 5–15)
BILIRUBIN TOTAL: 1.4 mg/dL — AB (ref 0.3–1.2)
BUN: 15 mg/dL (ref 6–20)
CALCIUM: 9.1 mg/dL (ref 8.9–10.3)
CO2: 15 mmol/L — AB (ref 22–32)
CREATININE: 1.2 mg/dL (ref 0.61–1.24)
Chloride: 97 mmol/L — ABNORMAL LOW (ref 101–111)
GFR calc non Af Amer: >60 mL/min (ref >60)
Glucose, Bld: 501 mg/dL (ref 65–99)
Potassium: 4.9 mmol/L (ref 3.5–5.1)
SODIUM: 133 mmol/L — AB (ref 135–145)
Total Protein: 7.1 g/dL (ref 6.5–8.1)

## 2017-06-15 LAB — CBG MONITORING, ED
GLUCOSE-CAPILLARY: 493 mg/dL — AB (ref 65–99)
Glucose-Capillary: 507 mg/dL (ref 65–99)

## 2017-06-15 LAB — URINALYSIS, ROUTINE W REFLEX MICROSCOPIC
Bacteria, UA: NONE SEEN
Bilirubin Urine: NEGATIVE
Hgb urine dipstick: NEGATIVE
Ketones, ur: 80 mg/dL — AB
LEUKOCYTES UA: NEGATIVE
NITRITE: NEGATIVE
PH: 5 (ref 5.0–8.0)
Protein, ur: NEGATIVE mg/dL
RBC / HPF: NONE SEEN RBC/hpf (ref 0–5)
SPECIFIC GRAVITY, URINE: 1.024 (ref 1.005–1.030)
WBC, UA: NONE SEEN WBC/hpf (ref 0–5)

## 2017-06-15 LAB — CBC
HCT: 40.4 % (ref 39.0–52.0)
Hemoglobin: 13.2 g/dL (ref 13.0–17.0)
MCH: 28.6 pg (ref 26.0–34.0)
MCHC: 32.7 g/dL (ref 30.0–36.0)
MCV: 87.4 fL (ref 78.0–100.0)
PLATELETS: 527 10*3/uL — AB (ref 150–400)
RBC: 4.62 MIL/uL (ref 4.22–5.81)
RDW: 12.4 % (ref 11.5–15.5)
WBC: 14.1 10*3/uL — AB (ref 4.0–10.5)

## 2017-06-15 LAB — LIPASE, BLOOD: Lipase: 19 U/L (ref 11–51)

## 2017-06-15 MED ORDER — ONDANSETRON HCL 4 MG/2ML IJ SOLN: 4.0000 mg | Freq: Three times a day (TID) | INTRAMUSCULAR | Status: DC | PRN

## 2017-06-15 MED ORDER — ONDANSETRON 4 MG PO TBDP
ORAL_TABLET | ORAL | Status: AC
  Filled 2017-06-15: qty 1

## 2017-06-15 MED ORDER — SODIUM CHLORIDE 0.9 % IV SOLN
INTRAVENOUS | Status: DC
  Administered 2017-06-15: 4.3 [IU]/h via INTRAVENOUS
  Filled 2017-06-15: qty 1

## 2017-06-15 MED ORDER — ZOLPIDEM TARTRATE 5 MG PO TABS
5.0000 mg | ORAL_TABLET | Freq: Every evening | ORAL | Status: DC | PRN
  Administered 2017-06-16 – 2017-06-18 (×4): 5 mg via ORAL
  Filled 2017-06-15 (×4): qty 1

## 2017-06-15 MED ORDER — HYDRALAZINE HCL 20 MG/ML IJ SOLN: 5.0000 mg | INTRAMUSCULAR | Status: DC | PRN

## 2017-06-15 MED ORDER — OXYCODONE HCL 5 MG PO TABS
5.0000 mg | ORAL_TABLET | Freq: Four times a day (QID) | ORAL | Status: DC | PRN
  Administered 2017-06-15 – 2017-06-16 (×2): 5 mg via ORAL
  Filled 2017-06-15 (×2): qty 1

## 2017-06-15 MED ORDER — SODIUM CHLORIDE 0.9 % IV SOLN
INTRAVENOUS | Status: DC
  Administered 2017-06-15: 23:00:00 via INTRAVENOUS

## 2017-06-15 MED ORDER — PANTOPRAZOLE SODIUM 40 MG PO TBEC
40.0000 mg | DELAYED_RELEASE_TABLET | Freq: Two times a day (BID) | ORAL | Status: DC
  Administered 2017-06-16 – 2017-06-19 (×7): 40 mg via ORAL
  Filled 2017-06-15 (×8): qty 1

## 2017-06-15 MED ORDER — SODIUM CHLORIDE 0.9 % IV BOLUS (SEPSIS)
2000.0000 mL | Freq: Once | INTRAVENOUS | Status: AC
  Administered 2017-06-15: 2000 mL via INTRAVENOUS

## 2017-06-15 MED ORDER — DEXTROSE-NACL 5-0.45 % IV SOLN
INTRAVENOUS | Status: DC
  Administered 2017-06-16 (×2): via INTRAVENOUS

## 2017-06-15 MED ORDER — SODIUM CHLORIDE 0.9 % IV SOLN
INTRAVENOUS | Status: DC
  Administered 2017-06-15 – 2017-06-18 (×6): via INTRAVENOUS

## 2017-06-15 MED ORDER — POTASSIUM CHLORIDE 10 MEQ/100ML IV SOLN: 10.0000 meq | INTRAVENOUS | Status: AC

## 2017-06-15 MED ORDER — SODIUM CHLORIDE 0.9 % IV BOLUS (SEPSIS)
1000.0000 mL | Freq: Once | INTRAVENOUS | Status: AC
  Administered 2017-06-15: 1000 mL via INTRAVENOUS

## 2017-06-15 MED ORDER — METOCLOPRAMIDE HCL 5 MG/ML IJ SOLN
5.0000 mg | Freq: Three times a day (TID) | INTRAMUSCULAR | Status: DC
  Administered 2017-06-16 (×2): 5 mg via INTRAVENOUS
  Filled 2017-06-15 (×2): qty 2

## 2017-06-15 MED ORDER — TRAZODONE HCL 100 MG PO TABS
300.0000 mg | ORAL_TABLET | Freq: Every day | ORAL | Status: DC
  Administered 2017-06-16 – 2017-06-18 (×3): 300 mg via ORAL
  Filled 2017-06-15 (×3): qty 3

## 2017-06-15 MED ORDER — DEXTROSE-NACL 5-0.45 % IV SOLN: INTRAVENOUS | Status: DC

## 2017-06-15 MED ORDER — SODIUM CHLORIDE 0.9 % IV SOLN
INTRAVENOUS | Status: DC
  Administered 2017-06-16: 3.1 [IU]/h via INTRAVENOUS
  Filled 2017-06-15: qty 1

## 2017-06-15 MED ORDER — DOXEPIN HCL 10 MG/ML PO CONC: 100.0000 mg | Freq: Every day | ORAL | Status: DC

## 2017-06-15 MED ORDER — NICOTINE 21 MG/24HR TD PT24
21.0000 mg | MEDICATED_PATCH | Freq: Every day | TRANSDERMAL | Status: DC
  Administered 2017-06-16 – 2017-06-19 (×4): 21 mg via TRANSDERMAL
  Filled 2017-06-15 (×4): qty 1

## 2017-06-15 MED ORDER — ONDANSETRON 4 MG PO TBDP
4.0000 mg | ORAL_TABLET | Freq: Once | ORAL | Status: AC | PRN
  Administered 2017-06-15: 4 mg via ORAL

## 2017-06-15 MED ORDER — SENNOSIDES-DOCUSATE SODIUM 8.6-50 MG PO TABS
2.0000 | ORAL_TABLET | Freq: Two times a day (BID) | ORAL | Status: DC
  Administered 2017-06-16 – 2017-06-19 (×7): 2 via ORAL
  Filled 2017-06-15 (×8): qty 2

## 2017-06-15 NOTE — ED Triage Notes (Signed)
Pt recently discharged from hosp and admission for DKA.  Pt st's he started vomiting last pm and has continued today.  Pt st's he has been taking his insulin CBG in triage 507

## 2017-06-15 NOTE — ED Provider Notes (Signed)
[facility] [place] DEPT Provider Note   CSN: [id number] Arrival date & time: 06/15/17  1709     History   Chief Complaint Chief Complaint  Patient presents with  . Emesis    HPI [name] is a 31 y.o. male.  HPI  31 year old male with medical history significant for type 1 diabetes, hypertension, GERD, schizophrenia, anxiety, depression, substance abuse, chronic pain presents today with complaints of nausea vomiting.  Patient was most recently discharged from the hospital 4 days ago status post DKA.  Patient notes that once he was discharged he was feeling better.  He was drinking equate protein drinks at home, yesterday was able to eat a [place] sandwich.  He notes he was feeling better earlier on in the day, but then developed nausea and vomiting.  He is unable to tolerate any p.o. since then.  Patient notes he was taking his insulin yesterday, but did not take it today as he was unable to eat.  Patient notes that he was unable to fill his prescription for his glucometer as the billing code was incorrect per pharmacy.  Patient notes that he had an episode with dark blood earlier today, followed by some dark speckles prior to arrival.    Past Medical History:  Diagnosis Date  . Anemia   . Anxiety   . Arthritis    "both hips; both shoulders" (06/06/2017)  . Chicken pox   . Childhood asthma   . Chronic pain   . Depression   . DKA (diabetic ketoacidoses) ([place]) 11/18/2014  . GERD (gastroesophageal reflux disease)   . Hypertension   . Migraine    "a few/year" (06/06/2017)  . Noncompliance with medication regimen   . Pneumonia    "several times" (06/06/2017)  . Polysubstance abuse   . Schizo affective schizophrenia ([place])   . Scoliosis   . Type I diabetes mellitus [facility])     Patient Active Problem List   Diagnosis Date Noted  . Nausea & vomiting 06/15/2017  . Abdominal pain 06/15/2017  . SIRS (systemic inflammatory response syndrome) ([place]) 06/07/2017  . Chest pain  06/07/2017  . GERD (gastroesophageal reflux disease) 06/07/2017  . DKA, type 1 ([place]) 06/06/2017  . DKA (diabetic ketoacidoses) ([place]) 06/03/2017  . Substance abuse 05/30/2016  . Substance induced mood disorder ([place]) 05/30/2016  . Tobacco use disorder 12/24/2015  . DM hyperosmolarity type I, uncontrolled ([place]) 12/19/2015  . Type I diabetes mellitus with hyperosmolarity ([place]) 12/19/2015  . Left shoulder pain 08/19/2015  . Generalized headache 07/22/2015  . Bilateral hip bursitis 05/26/2015  . Depression   . Generalized anxiety disorder 03/29/2015  . Undifferentiated schizophrenia ([place])   . Hip pain, bilateral 11/18/2014  . Hyponatremia 06/17/2012  . Dehydration 06/17/2012    Past Surgical History:  Procedure Laterality Date  . CARDIAC CATHETERIZATION  11/2016  . INCISION AND DRAINAGE ABSCESS Left 11/2011   "MRSA removed off my thumb"       Home Medications    Prior to Admission medications   Medication Sig Start Date End Date Taking? Authorizing Provider  feeding supplement, GLUCERNA SHAKE, (GLUCERNA SHAKE) LIQD Take 237 mLs by mouth 3 (three) times daily between meals. 06/09/17  Yes Hosie Poisson, MD  insulin aspart (NOVOLOG FLEXPEN) 100 UNIT/ML FlexPen CBG 70 - 120: 0 units CBG 121 - 150: 2 units CBG 151 - 200: 3 units CBG 201 - 250: 5 units CBG 251 - 300: 8 units CBG 301 - 350: 11 units CBG 351 - 400: 15 units  Patient taking differently: Inject 10 Units into the skin 3 (three) times daily with meals. CBG 70 - 120: 0 units CBG 121 - 150: 2 units CBG 151 - 200: 3 units CBG 201 - 250: 5 units CBG 251 - 300: 8 units CBG 301 - 350: 11 units CBG 351 - 400: 15 units 06/11/17  Yes Hosie Poisson, MD  Insulin Detemir (LEVEMIR FLEXPEN) 100 UNIT/ML Pen Inject 45 Units into the skin daily at 10 pm. Patient taking differently: Inject 45 Units into the skin at bedtime.  06/11/17  Yes Hosie Poisson, MD  ondansetron (ZOFRAN) 4 MG tablet Take 1 tablet (4 mg total) by mouth every 8  (eight) hours as needed for nausea or vomiting. 06/11/17 06/11/18 Yes Hosie Poisson, MD  oxyCODONE (OXY IR/ROXICODONE) 5 MG immediate release tablet Take 1 tablet (5 mg total) by mouth every 6 (six) hours as needed for moderate pain. 06/11/17  Yes Hosie Poisson, MD  traZODone (DESYREL) 150 MG tablet Take 300 mg by mouth at bedtime.    Yes [provider]  blood glucose meter kit and supplies KIT Dispense based on patient and insurance preference. Use up to four times daily as directed. (FOR ICD-9 250.00, 250.01). 06/11/17   Hosie Poisson, MD  doxepin (SINEQUAN) 10 MG/ML solution Take 100 mg by mouth at bedtime.    [provider]  insulin aspart (NOVOLOG) 100 UNIT/ML injection CBG 70 - 120: 0 units CBG 121 - 150: 2 units CBG 151 - 200: 3 units CBG 201 - 250: 5 units CBG 251 - 300: 8 units CBG 301 - 350: 11 units CBG 351 - 400: 15 units Patient not taking: Reported on 06/15/2017 06/11/17   Hosie Poisson, MD  Insulin Pen Needle (PEN NEEDLES) 31G X 5 MM MISC 90 each by Does not apply route 4 (four) times daily -  before meals and at bedtime. 06/11/17   Hosie Poisson, MD  metoCLOPramide (REGLAN) 10 MG tablet Take 1 tablet (10 mg total) by mouth 3 (three) times daily with meals. 06/11/17 06/11/18  Hosie Poisson, MD  pantoprazole (PROTONIX) 40 MG tablet Take 1 tablet (40 mg total) by mouth daily. 06/10/17   Hosie Poisson, MD  senna-docusate (SENOKOT-S) 8.6-50 MG tablet Take 2 tablets by mouth 2 (two) times daily. 06/11/17   Hosie Poisson, MD    Family History Family History  Problem Relation Age of Onset  . Diabetes Mother     Social History Social History  Substance Use Topics  . Smoking status: Current Every Day Smoker    Packs/day: 0.50    Years: 9.00    Types: Cigarettes  . Smokeless tobacco: Never Used  . Alcohol use No     Allergies   Sulfa antibiotics   Review of Systems Review of Systems  All other systems reviewed and are negative.    Physical Exam Updated  Vital Signs BP 125/87   Pulse (!) 117   Temp 98.5 F (36.9 C) (Oral)   Resp 20   Ht _0  (1.854 m)   Wt 90.7 kg (200 lb)   SpO2 100%   BMI 26.39 kg/m   Physical Exam  Constitutional: He is oriented to person, place, and time. He appears well-developed and well-nourished.  HENT:  Head: Normocephalic and atraumatic.  Eyes: Conjunctivae are normal. Pupils are equal, round, and reactive to light. Right eye exhibits no discharge. Left eye exhibits no discharge. No scleral icterus.  Neck: Normal range of motion. No JVD present. No tracheal  deviation present.  Cardiovascular: Regular rhythm and normal heart sounds.   Pulmonary/Chest: Effort normal and breath sounds normal. No stridor. No respiratory distress. He has no wheezes. He has no rales. He exhibits no tenderness.  Abdominal: Soft. He exhibits no distension. There is tenderness. There is no guarding.  Minor tenderness to the epigastric region  Musculoskeletal: He exhibits edema.  Neurological: He is alert and oriented to person, place, and time. Coordination normal.  Skin: Skin is warm.  Psychiatric: He has a normal mood and affect. His behavior is normal. Judgment and thought content normal.  Nursing note and vitals reviewed.    ED Treatments / Results  Labs (all labs ordered are listed, but only abnormal results are displayed) Labs Reviewed  CBC - Abnormal; Notable for the following:       Result Value   WBC 14.1 (*)    Platelets 527 (*)    All other components within normal limits  URINALYSIS, ROUTINE W REFLEX MICROSCOPIC - Abnormal; Notable for the following:    Color, Urine STRAW (*)    Glucose, UA >=500 (*)    Ketones, ur 80 (*)    Squamous Epithelial / LPF 0-5 (*)    All other components within normal limits  COMPREHENSIVE METABOLIC PANEL - Abnormal; Notable for the following:    Sodium 133 (*)    Chloride 97 (*)    CO2 15 (*)    Glucose, Bld 501 (*)    ALT 80 (*)    Total Bilirubin 1.4 (*)    Anion gap 21  (*)    All other components within normal limits  CBG MONITORING, ED - Abnormal; Notable for the following:    Glucose-Capillary 507 (*)    All other components within normal limits  CBG MONITORING, ED - Abnormal; Notable for the following:    Glucose-Capillary 493 (*)    All other components within normal limits  LIPASE, BLOOD    EKG  EKG Interpretation None       Radiology No results found.  Procedures Procedures (including critical care time)  Medications Ordered in ED Medications  doxepin (SINEQUAN) 10 MG/ML solution 100 mg (not administered)  oxyCODONE (Oxy IR/ROXICODONE) immediate release tablet 5 mg (not administered)  pantoprazole (PROTONIX) EC tablet 40 mg (not administered)  senna-docusate (Senokot-S) tablet 2 tablet (not administered)  traZODone (DESYREL) tablet 300 mg (not administered)  metoCLOPramide (REGLAN) injection 5 mg (not administered)  ondansetron (ZOFRAN) injection 4 mg (not administered)  nicotine (NICODERM CQ - dosed in mg/24 hours) patch 21 mg (not administered)  sodium chloride 0.9 % bolus 2,000 mL (not administered)  ondansetron (ZOFRAN-ODT) disintegrating tablet 4 mg (4 mg Oral Given 06/15/17 1936)  sodium chloride 0.9 % bolus 1,000 mL (0 mLs Intravenous Stopped 06/15/17 2237)     Initial Impression / Assessment and Plan / ED Course  I have reviewed the triage vital signs and the nursing notes.  Pertinent labs & imaging results that were available during my care of the patient were reviewed by me and considered in my medical decision making (see chart for details).      CRITICAL CARE Performed by: Elmer Ramp   Total critical care time: 35 minutes  Critical care time was exclusive of separately billable procedures and treating other patients.  Critical care was necessary to treat or prevent imminent or life-threatening deterioration.  Critical care was time spent personally by me on the following activities: development of  treatment plan with patient and/or surrogate as  well as nursing, discussions with consultants, evaluation of patient's response to treatment, examination of patient, obtaining history from patient or surrogate, ordering and performing treatments and interventions, ordering and review of laboratory studies, ordering and review of radiographic studies, pulse oximetry and re-evaluation of patient's condition.  Final Clinical Impressions(s) / ED Diagnoses   Final diagnoses:  Diabetic ketoacidosis without coma associated with type 1 diabetes mellitus ([name])    Labs: CBC, urinalysis, CBC CMP, lipase  Imaging:  Consults:  Therapeutics: Insulin regular, normal saline, Zofran  Discharge Meds:   Assessment/Plan: 31 year old male presented with DKA likely secondary to noncompliance.  Patient given Zofran prior to my evaluation.  His blood sugar here is 507, ketones in the urine, anion gap 21, bicarb 15.  Patient will need hospital admission for ongoing management of his DKA.  Upon my initial evaluation patient reporting minor epigastric abdominal discomfort.  Patient requesting pain medication here in the ED, patient has history of polysubstance abuse, chronic pain, no pain medication and care at this time.  Have a low suspicion for any significant intra-abdominal pathology this patient is very well-appearing and originally had no significant abdominal tenderness.      New Prescriptions New Prescriptions   No medications on file     Francee Gentile 06/15/17 Martin, [place], DO 06/15/17 2325

## 2017-06-15 NOTE — ED Notes (Signed)
Now reporting vomiting with small "specks" of blood and nausea.  To medicated for nausea.

## 2017-06-15 NOTE — ED Notes (Signed)
Madilyn Hook MD made aware of CBG 501

## 2017-06-15 NOTE — H&P (Signed)
History and Physical    [name] QQI:[id number] DOB: 05-Aug-1986 DOA: 06/15/2017  Referring MD/NP/PA:   PCP: Nolene Ebbs, MD   Patient coming from:  The patient is coming from home.  At baseline, pt is independent for most of ADL.    Chief Complaint: Nausea, vomiting, abdominal pain.  HPI: [name] is a 31 y.o. male with medical history significant of type 1 diabetes, insulin noncompliance, DKA, polysubstance abuse including tobacco, cocaine and marijuana, amphetamine, schizophrenia, GERD, depression, anxiety, who presents with nausea, vomiting, abdominal pain.  Patient had history of severe DKA requiring ICU admission in the past due to insulin noncompliance. He was recently hospitalized from 6/20-6/25 due to DKA. Pt states that he started having nausea and could not eat well since 2 days ago, today he developed vomiting. He vomited more than 10 times, sometimes with dark colored material which he thinks is blood. He has some abdominal pain, which is located in the epigastric area, constant, sharp, 8 out of 10 in severity, nonradiating. Patient does not have rectal bleeding. Denies chest pain, SOB, fever, chills, cough, symptoms of UTI or unilateral weakness. He states that he did not take his insulin today.  ED Course: pt was found to have DKA with blood sugar 501, AG 21 and bicarbonate 15, WBC 14.1, potassium 4.9, lipase 19, negative urinalysis, temperature normal, tachycardia, oxygen saturation 98% on room air. Patient is admitted to stepdown as inpatient.  Review of Systems:   General: no fevers, chills, no changes in body weight, has poor appetite, has fatigue HEENT: no blurry vision, hearing changes or sore throat Respiratory: no dyspnea, coughing, wheezing CV: no chest pain, no palpitations GI: has nausea, vomiting, abdominal pain, no diarrhea, constipation GU: no dysuria, burning on urination, increased urinary frequency, hematuria  Ext: no leg edema Neuro:  no unilateral weakness, numbness, or tingling, no vision change or hearing loss Skin: no rash, no skin tear. MSK: No muscle spasm, no deformity, no limitation of range of movement in spin Heme: No easy bruising.  Travel history: No recent long distant travel.  Allergy:  Allergies  Allergen Reactions  . Sulfa Antibiotics Other (See Comments)    Unknown childhood allergy    Past Medical History:  Diagnosis Date  . Anemia   . Anxiety   . Arthritis    "both hips; both shoulders" (06/06/2017)  . Chicken pox   . Childhood asthma   . Chronic pain   . Depression   . DKA (diabetic ketoacidoses) ([place]) 11/18/2014  . GERD (gastroesophageal reflux disease)   . Hypertension   . Migraine    "a few/year" (06/06/2017)  . Noncompliance with medication regimen   . Pneumonia    "several times" (06/06/2017)  . Polysubstance abuse   . Schizo affective schizophrenia ([place])   . Scoliosis   . Type I diabetes mellitus ([place])     Past Surgical History:  Procedure Laterality Date  . CARDIAC CATHETERIZATION  11/2016  . INCISION AND DRAINAGE ABSCESS Left 11/2011   "MRSA removed off my thumb"    Social History:  reports that he has been smoking Cigarettes.  He has a 4.50 pack-year smoking history. He has never used smokeless tobacco. He reports that he uses drugs, including Marijuana, Cocaine, Methamphetamines, and "Crack" cocaine. He reports that he does not drink alcohol.  Family History:  Family History  Problem Relation Age of Onset  . Diabetes Mother      Prior to Admission medications   Medication Sig  Start Date End Date Taking? Authorizing Provider  feeding supplement, GLUCERNA SHAKE, (GLUCERNA SHAKE) LIQD Take 237 mLs by mouth 3 (three) times daily between meals. 06/09/17  Yes Hosie Poisson, MD  insulin aspart (NOVOLOG FLEXPEN) 100 UNIT/ML FlexPen CBG 70 - 120: 0 units CBG 121 - 150: 2 units CBG 151 - 200: 3 units CBG 201 - 250: 5 units CBG 251 - 300: 8 units CBG 301 - 350: 11  units CBG 351 - 400: 15 units Patient taking differently: Inject 10 Units into the skin 3 (three) times daily with meals. CBG 70 - 120: 0 units CBG 121 - 150: 2 units CBG 151 - 200: 3 units CBG 201 - 250: 5 units CBG 251 - 300: 8 units CBG 301 - 350: 11 units CBG 351 - 400: 15 units 06/11/17  Yes Hosie Poisson, MD  Insulin Detemir (LEVEMIR FLEXPEN) 100 UNIT/ML Pen Inject 45 Units into the skin daily at 10 pm. Patient taking differently: Inject 45 Units into the skin at bedtime.  06/11/17  Yes Hosie Poisson, MD  ondansetron (ZOFRAN) 4 MG tablet Take 1 tablet (4 mg total) by mouth every 8 (eight) hours as needed for nausea or vomiting. 06/11/17 06/11/18 Yes Hosie Poisson, MD  oxyCODONE (OXY IR/ROXICODONE) 5 MG immediate release tablet Take 1 tablet (5 mg total) by mouth every 6 (six) hours as needed for moderate pain. 06/11/17  Yes Hosie Poisson, MD  traZODone (DESYREL) 150 MG tablet Take 300 mg by mouth at bedtime.    Yes [provider]  blood glucose meter kit and supplies KIT Dispense based on patient and insurance preference. Use up to four times daily as directed. (FOR ICD-9 250.00, 250.01). 06/11/17   Hosie Poisson, MD  doxepin (SINEQUAN) 10 MG/ML solution Take 100 mg by mouth at bedtime.    [provider]  insulin aspart (NOVOLOG) 100 UNIT/ML injection CBG 70 - 120: 0 units CBG 121 - 150: 2 units CBG 151 - 200: 3 units CBG 201 - 250: 5 units CBG 251 - 300: 8 units CBG 301 - 350: 11 units CBG 351 - 400: 15 units Patient not taking: Reported on 06/15/2017 06/11/17   Hosie Poisson, MD  Insulin Pen Needle (PEN NEEDLES) 31G X 5 MM MISC 90 each by Does not apply route 4 (four) times daily -  before meals and at bedtime. 06/11/17   Hosie Poisson, MD  metoCLOPramide (REGLAN) 10 MG tablet Take 1 tablet (10 mg total) by mouth 3 (three) times daily with meals. 06/11/17 06/11/18  Hosie Poisson, MD  pantoprazole (PROTONIX) 40 MG tablet Take 1 tablet (40 mg total) by mouth daily. 06/10/17    Hosie Poisson, MD  senna-docusate (SENOKOT-S) 8.6-50 MG tablet Take 2 tablets by mouth 2 (two) times daily. 06/11/17   Hosie Poisson, MD    Physical Exam: Vitals:   06/15/17 2130 06/15/17 2215 06/15/17 2245 06/15/17 2322  BP: 121/79 125/87 133/79 126/74  Pulse: (!) 118 (!) 117 (!) 118 (!) 116  Resp:    16  Temp:      TempSrc:      SpO2: 100% 100% 100% 100%  Weight:      Height:       General: Not in acute distress HEENT:       Eyes: PERRL, EOMI, no scleral icterus.       ENT: No discharge from the ears and nose, no pharynx injection, no tonsillar enlargement.        Neck: No JVD, no  bruit, no mass felt. Heme: No neck lymph node enlargement. Cardiac: S1/S2, RRR, No murmurs, No gallops or rubs. Respiratory: No rales, wheezing, rhonchi or rubs. GI: Soft, nondistended, has tenderness in epigastric area, no rebound pain, no organomegaly, BS present. GU: No hematuria Ext: No pitting leg edema bilaterally. 2+DP/PT pulse bilaterally. Musculoskeletal: No joint deformities, No joint redness or warmth, no limitation of ROM in spin. Skin: No rashes.  Neuro: Alert, oriented X3, cranial nerves II-XII grossly intact, moves all extremities normally. Psych: Patient is not psychotic, no suicidal or hemocidal ideation.  Labs on Admission: I have personally reviewed following labs and imaging studies  CBC:  Recent Labs Lab 06/15/17 1731  WBC 14.1*  HGB 13.2  HCT 40.4  MCV 87.4  PLT 110*   Basic Metabolic Panel:  Recent Labs Lab 06/10/17 1118 06/15/17 1731 06/15/17 2320  NA 139 133* 131*  K 4.1 4.9 5.1  CL 102 97* 99*  CO2 30 15* 13*  GLUCOSE 266* 501* 452*  BUN _0 CREATININE 0.74 1.20 1.46*  CALCIUM 8.9 9.1 8.7*   GFR: Estimated Creatinine Clearance: 82.1 mL/min (A) (by C-G formula based on SCr of 1.46 mg/dL (H)). Liver Function Tests:  Recent Labs Lab 06/09/17 1827 06/15/17 1731  AST 110* 26  ALT 89* 80*  ALKPHOS 73 108  BILITOT 0.3 1.4*  PROT 5.0* 7.1   ALBUMIN 2.8* 4.1    Recent Labs Lab 06/09/17 1827 06/15/17 1731  LIPASE 20 19   No results for input(s): AMMONIA in the last 168 hours. Coagulation Profile:  Recent Labs Lab 06/15/17 2320  INR 1.08   Cardiac Enzymes: No results for input(s): CKTOTAL, CKMB, CKMBINDEX, TROPONINI in the last 168 hours. BNP (last 3 results) No results for input(s): PROBNP in the last 8760 hours. HbA1C: No results for input(s): HGBA1C in the last 72 hours. CBG:  Recent Labs Lab 06/11/17 0748 06/11/17 1133 06/15/17 1726 06/15/17 2218 06/16/17 0008  GLUCAP 279* 262* 507* 493* 366*   Lipid Profile: No results for input(s): CHOL, HDL, LDLCALC, TRIG, CHOLHDL, LDLDIRECT in the last 72 hours. Thyroid Function Tests: No results for input(s): TSH, T4TOTAL, FREET4, T3FREE, THYROIDAB in the last 72 hours. Anemia Panel: No results for input(s): VITAMINB12, FOLATE, FERRITIN, TIBC, IRON, RETICCTPCT in the last 72 hours. Urine analysis:    Component Value Date/Time   COLORURINE STRAW (A) 06/15/2017 1937   APPEARANCEUR CLEAR 06/15/2017 1937   LABSPEC 1.024 06/15/2017 1937   PHURINE 5.0 06/15/2017 1937   GLUCOSEU >=500 (A) 06/15/2017 1937   HGBUR NEGATIVE 06/15/2017 1937   BILIRUBINUR NEGATIVE 06/15/2017 1937   BILIRUBINUR negative 03/29/2015 1114   KETONESUR 80 (A) 06/15/2017 1937   PROTEINUR NEGATIVE 06/15/2017 1937   UROBILINOGEN 0.2 05/17/2015 1810   NITRITE NEGATIVE 06/15/2017 1937   LEUKOCYTESUR NEGATIVE 06/15/2017 1937   Sepsis Labs: _1 (procalcitonin:4,lacticidven:4) ) Recent Results (from the past 240 hour(s))  MRSA PCR Screening     Status: None   Collection Time: 06/06/17 10:52 PM  Result Value Ref Range Status   MRSA by PCR NEGATIVE NEGATIVE Final    Comment:        The GeneXpert MRSA Assay (FDA approved for NASAL specimens only), is one component of a comprehensive MRSA colonization surveillance program. It is not intended to diagnose MRSA infection nor to guide  or monitor treatment for MRSA infections.   Culture, blood (routine x 2)     Status: None   Collection Time: 06/06/17 11:11 PM  Result Value  Ref Range Status   Specimen Description BLOOD RIGHT ARM  Final   Special Requests   Final    BOTTLES DRAWN AEROBIC ONLY Blood Culture adequate volume   Culture NO GROWTH 5 DAYS  Final   Report Status 06/12/2017 FINAL  Final  Culture, blood (routine x 2)     Status: None   Collection Time: 06/06/17 11:19 PM  Result Value Ref Range Status   Specimen Description BLOOD RIGHT HAND  Final   Special Requests   Final    BOTTLES DRAWN AEROBIC ONLY Blood Culture adequate volume   Culture NO GROWTH 5 DAYS  Final   Report Status 06/12/2017 FINAL  Final     Radiological Exams on Admission: No results found.   EKG: Independently reviewed.  Not done in ED, will get one.   Assessment/Plan Principal Problem:   DKA, type 1 ([name]) Active Problems:   Depression   DM hyperosmolarity type I, uncontrolled ([name])   Tobacco use disorder   DKA (diabetic ketoacidoses) ([name])   SIRS (systemic inflammatory response syndrome) ([name])   GERD (gastroesophageal reflux disease)   Nausea & vomiting   Abdominal pain   DKA, type 1 ([place]): blood sugar 501, AG 21 and bicarbonate 15. This is likely due to insulin Noncompliance and dehydration secondary to ongoing nausea and vomiting. Mental status normal. Hemodynamically stable.  - Admit to stepdown as inpt - give 3 L NS bolus  - start DKA protocol with BMP q4h - IVF: NS 125 cc/h; will switch to D5-1/2NS when CBG<250 - replete K as needed - NPO  - consult to case manager  Nausea, vomiting, abdominal pain and possible hematemesis: Hemoglobin stable 13.2. Lipase normal. Patient may have gastroparesis secondary to poorly controlled diabetes. Her abdominal examination is benign, no acute abdomen. -Scheduled Reglan 5 mg 3 times a day -When necessary Zofran -IV fluid as above -Increased Protonix 40 mg daily to twice a  day -INR/type & screen -continue home prn oxycodone for abd pain  DM-I: Last A1c 13.9 on 2/417, very poorly controled due to insulin noncompliance. Now has DKA -on DKA protocol -Check A1c -consult to CM  Depression: Stable, no suicidal or homicidal ideations. Stopped taking Doxepin  -observe closely  Tobacco abuse: -Did counseling about importance of quitting smoking -Nicotine patch  GERD: -Protonix as above  SIRS (systemic inflammatory response syndrome) [facility]): Patient meets criteria for sepsis with leukocytosis and tachycardia. No signs of infection. Likely due to DKA. Urinalysis negative. No respiratory symptoms. -IV fluid as above -Follow-up blood culture   DVT ppx: SCD Code Status: Full code Family Communication: None at bed side.    Disposition Plan:  Anticipate discharge back to previous home environment Consults called:  none Admission status:  SDU/inpation       Date of Service 06/16/2017    Ivor Costa [facility] Pager [phone]  If 7PM-7AM, please contact night-coverage [email or web address] Password [facility] 06/16/2017, 12:37 AM

## 2017-06-16 DIAGNOSIS — R739 Hyperglycemia, unspecified: Secondary | ICD-10-CM | POA: Diagnosis present

## 2017-06-16 DIAGNOSIS — F321 Major depressive disorder, single episode, moderate: Secondary | ICD-10-CM

## 2017-06-16 LAB — TYPE AND SCREEN
ABO/RH(D): O POS
ANTIBODY SCREEN: NEGATIVE

## 2017-06-16 LAB — BASIC METABOLIC PANEL
ANION GAP: 19 — AB (ref 5–15)
ANION GAP: 10 (ref 5–15)
Anion gap: 8 (ref 5–15)
Anion gap: 9 (ref 5–15)
BUN: 17 mg/dL (ref 6–20)
BUN: 13 mg/dL (ref 6–20)
BUN: 10 mg/dL (ref 6–20)
BUN: 9 mg/dL (ref 6–20)
CALCIUM: 8 mg/dL — AB (ref 8.9–10.3)
CALCIUM: 8.5 mg/dL — AB (ref 8.9–10.3)
CHLORIDE: 99 mmol/L — AB (ref 101–111)
CHLORIDE: 108 mmol/L (ref 101–111)
CHLORIDE: 109 mmol/L (ref 101–111)
CHLORIDE: 106 mmol/L (ref 101–111)
CO2: 13 mmol/L — ABNORMAL LOW (ref 22–32)
CO2: 18 mmol/L — ABNORMAL LOW (ref 22–32)
CO2: 20 mmol/L — ABNORMAL LOW (ref 22–32)
CO2: 20 mmol/L — ABNORMAL LOW (ref 22–32)
CREATININE: 1.46 mg/dL — AB (ref 0.61–1.24)
CREATININE: 1.02 mg/dL (ref 0.61–1.24)
CREATININE: 0.8 mg/dL (ref 0.61–1.24)
CREATININE: 0.8 mg/dL (ref 0.61–1.24)
Calcium: 8.7 mg/dL — ABNORMAL LOW (ref 8.9–10.3)
Calcium: 8.1 mg/dL — ABNORMAL LOW (ref 8.9–10.3)
GFR calc non Af Amer: >60 mL/min (ref >60)
GFR calc non Af Amer: >60 mL/min (ref >60)
GFR calc non Af Amer: >60 mL/min (ref >60)
GFR calc non Af Amer: >60 mL/min (ref >60)
Glucose, Bld: 452 mg/dL — ABNORMAL HIGH (ref 65–99)
Glucose, Bld: 214 mg/dL — ABNORMAL HIGH (ref 65–99)
Glucose, Bld: 159 mg/dL — ABNORMAL HIGH (ref 65–99)
Glucose, Bld: 205 mg/dL — ABNORMAL HIGH (ref 65–99)
Potassium: 5.1 mmol/L (ref 3.5–5.1)
Potassium: 4.5 mmol/L (ref 3.5–5.1)
Potassium: 4.1 mmol/L (ref 3.5–5.1)
Potassium: 4.2 mmol/L (ref 3.5–5.1)
SODIUM: 131 mmol/L — AB (ref 135–145)
SODIUM: 136 mmol/L (ref 135–145)
SODIUM: 137 mmol/L (ref 135–145)
SODIUM: 135 mmol/L (ref 135–145)

## 2017-06-16 LAB — TSH: TSH: 1.022 u[IU]/mL (ref 0.350–4.500)

## 2017-06-16 LAB — CBG MONITORING, ED
GLUCOSE-CAPILLARY: 137 mg/dL — AB (ref 65–99)
GLUCOSE-CAPILLARY: 160 mg/dL — AB (ref 65–99)
GLUCOSE-CAPILLARY: 166 mg/dL — AB (ref 65–99)
GLUCOSE-CAPILLARY: 204 mg/dL — AB (ref 65–99)
GLUCOSE-CAPILLARY: 254 mg/dL — AB (ref 65–99)
GLUCOSE-CAPILLARY: 366 mg/dL — AB (ref 65–99)
Glucose-Capillary: 151 mg/dL — ABNORMAL HIGH (ref 65–99)
Glucose-Capillary: 135 mg/dL — ABNORMAL HIGH (ref 65–99)
Glucose-Capillary: 171 mg/dL — ABNORMAL HIGH (ref 65–99)

## 2017-06-16 LAB — GLUCOSE, CAPILLARY
GLUCOSE-CAPILLARY: 194 mg/dL — AB (ref 65–99)
GLUCOSE-CAPILLARY: 337 mg/dL — AB (ref 65–99)
Glucose-Capillary: 247 mg/dL — ABNORMAL HIGH (ref 65–99)
Glucose-Capillary: 125 mg/dL — ABNORMAL HIGH (ref 65–99)

## 2017-06-16 LAB — PROTIME-INR
INR: 1.08
Prothrombin Time: 14.1 seconds (ref 11.4–15.2)

## 2017-06-16 LAB — LIPID PANEL
CHOL/HDL RATIO: 6 ratio
CHOLESTEROL: 191 mg/dL (ref 0–200)
HDL: 32 mg/dL — ABNORMAL LOW (ref >40)
LDL Cholesterol: 122 mg/dL — ABNORMAL HIGH (ref 0–99)
TRIGLYCERIDES: 186 mg/dL — AB (ref <150)
VLDL: 37 mg/dL (ref 0–40)

## 2017-06-16 LAB — RAPID URINE DRUG SCREEN, HOSP PERFORMED
Amphetamines: NOT DETECTED
BENZODIAZEPINES: NOT DETECTED
Barbiturates: NOT DETECTED
COCAINE: NOT DETECTED
Opiates: NOT DETECTED
Tetrahydrocannabinol: NOT DETECTED

## 2017-06-16 LAB — ABO/RH: ABO/RH(D): O POS

## 2017-06-16 MED ORDER — METOCLOPRAMIDE HCL 5 MG/ML IJ SOLN
10.0000 mg | Freq: Three times a day (TID) | INTRAMUSCULAR | Status: DC
  Administered 2017-06-16 – 2017-06-19 (×8): 10 mg via INTRAVENOUS
  Filled 2017-06-16 (×8): qty 2

## 2017-06-16 MED ORDER — POTASSIUM CHLORIDE 10 MEQ/100ML IV SOLN
10.0000 meq | INTRAVENOUS | Status: AC
  Administered 2017-06-16 (×2): 10 meq via INTRAVENOUS
  Filled 2017-06-16: qty 100

## 2017-06-16 MED ORDER — ACETAMINOPHEN 325 MG PO TABS: 650.0000 mg | ORAL_TABLET | Freq: Four times a day (QID) | ORAL | Status: DC | PRN

## 2017-06-16 MED ORDER — INSULIN ASPART 100 UNIT/ML ~~LOC~~ SOLN
0.0000 [IU] | SUBCUTANEOUS | Status: DC
  Administered 2017-06-16: 15 [IU] via SUBCUTANEOUS
  Administered 2017-06-17 (×2): 7 [IU] via SUBCUTANEOUS
  Administered 2017-06-17 (×2): 4 [IU] via SUBCUTANEOUS
  Administered 2017-06-17: 3 [IU] via SUBCUTANEOUS
  Administered 2017-06-18: 11 [IU] via SUBCUTANEOUS
  Administered 2017-06-18: 4 [IU] via SUBCUTANEOUS
  Administered 2017-06-18: 3 [IU] via SUBCUTANEOUS
  Administered 2017-06-18 (×2): 7 [IU] via SUBCUTANEOUS
  Administered 2017-06-19: 3 [IU] via SUBCUTANEOUS
  Administered 2017-06-19 (×2): 4 [IU] via SUBCUTANEOUS

## 2017-06-16 MED ORDER — SODIUM CHLORIDE 0.9 % IV SOLN
INTRAVENOUS | Status: DC
  Filled 2017-06-16: qty 1

## 2017-06-16 MED ORDER — ONDANSETRON HCL 4 MG/2ML IJ SOLN
4.0000 mg | Freq: Four times a day (QID) | INTRAMUSCULAR | Status: DC | PRN
  Administered 2017-06-16 – 2017-06-19 (×5): 4 mg via INTRAVENOUS
  Filled 2017-06-16 (×5): qty 2

## 2017-06-16 MED ORDER — INSULIN GLARGINE 100 UNIT/ML ~~LOC~~ SOLN
10.0000 [IU] | Freq: Every day | SUBCUTANEOUS | Status: DC
  Administered 2017-06-16 – 2017-06-19 (×4): 10 [IU] via SUBCUTANEOUS
  Filled 2017-06-16 (×5): qty 0.1

## 2017-06-16 MED ORDER — KETOROLAC TROMETHAMINE 15 MG/ML IJ SOLN
15.0000 mg | Freq: Four times a day (QID) | INTRAMUSCULAR | Status: DC | PRN
  Administered 2017-06-16 – 2017-06-19 (×5): 15 mg via INTRAVENOUS
  Filled 2017-06-16 (×5): qty 1

## 2017-06-16 MED ORDER — INSULIN GLARGINE 100 UNIT/ML ~~LOC~~ SOLN
10.0000 [IU] | Freq: Once | SUBCUTANEOUS | Status: AC
  Administered 2017-06-16: 10 [IU] via SUBCUTANEOUS
  Filled 2017-06-16: qty 0.1

## 2017-06-16 MED ORDER — INSULIN ASPART 100 UNIT/ML ~~LOC~~ SOLN
0.0000 [IU] | SUBCUTANEOUS | Status: DC
  Administered 2017-06-16: 5 [IU] via SUBCUTANEOUS

## 2017-06-16 NOTE — ED Notes (Signed)
Admitting MD paged ...  Ferman Hamming in D30 Anion gap has been closed on the CMP at 0242 and [phone] and he has 3 CBG below 200. Would you like for me to stop the insulin drip?   Jesse Sans RN

## 2017-06-16 NOTE — Progress Notes (Signed)
CM consult for LTACH. Patient without 2 midnight stay in ICU does not meet criteria for LTACH. CM will continue to follow for DC planning.

## 2017-06-16 NOTE — ED Notes (Addendum)
Dr. Clyde Lundborg advised nurse  that he will not adjust the pain medication that he ordered , RN notified pt. on admitting MD order. Pt. upset requesting to speak with director of nursing . Charge nurse notified on pt.'s request.

## 2017-06-16 NOTE — Progress Notes (Signed)
[name] Art, MD notified that patient has arrived to [phone] and to clarify orders regarding glucostabilizer. Orders placed. MD also paged regarding clarification of fluid order. Orders placed. Will continue to monitor.

## 2017-06-16 NOTE — ED Notes (Signed)
Spoke with admitting advised give 10 of Lantus after 2 hours then D/C insulin drip. Advised would move patient to Tele bed.

## 2017-06-16 NOTE — ED Notes (Signed)
Admitting MD paged.   Daughdrill in D30  Stabilizer advised to stop insulin now CBG was 137. Lantus given at 0756. Can patient now go to Tele bed.

## 2017-06-16 NOTE — ED Notes (Signed)
Pt. requesting to speak with admitting MD regarding his pain medication , pt. stated " it is not working " , Diplomatic Services operational officer paged Dr. Clyde Lundborg for pt.'s request.

## 2017-06-16 NOTE — Progress Notes (Signed)
Patient requests to have his doxepin with his trazodone at night instead of with Ambien. MD notified. Will continue to monitor.

## 2017-06-16 NOTE — Progress Notes (Signed)
New Admission Note:   Arrival Method: Bed  Mental Orientation: A&O X4 Telemetry: Initiated Assessment: Completed Skin: See flowsheets IV: Clean, Dry, Intact Pain: 7/10 Safety Measures: Safety Fall Prevention Plan has been given, discussed and signed Admission: Completed Unit Orientation: Patient has been orientated to the room, unit and staff.   Orders have been reviewed and implemented. Will continue to monitor the patient. Call light has been placed within reach and bed alarm has been activated.    Britt Bolognese RN, BSN

## 2017-06-16 NOTE — Progress Notes (Signed)
PROGRESS NOTE    [name]  [id number]:[id number] DOB: 08/04/1986 DOA: 06/15/2017 PCP: Fleet Contras, MD   Brief Narrative:  31 y.o. WM PMHx DM type 1 uncontrolled with complications, DKA, Noncompliance with medication, Polysubstance abuse (Tobacco, Cocaine, Marijuana, Amphetamine), Schizophrenia, Depression, Anxiety,GERD  Who presents with nausea, vomiting, abdominal pain.  Patient had history of severe DKA requiring ICU admission in the past due to insulin noncompliance. He was recently hospitalized from 6/20-6/25 due to DKA. Pt states that he started having nausea and could not eat well since 2 days ago, today he developed vomiting. He vomited more than 10 times, sometimes with dark colored material which he thinks is blood. He has some abdominal pain, which is located in the epigastric area, constant, sharp, 8 out of 10 in severity, nonradiating. Patient does not have rectal bleeding. Denies chest pain, SOB, fever, chills, cough, symptoms of UTI or unilateral weakness. He states that he did not take his insulin today.  ED Course: pt was found to have DKA with blood sugar 501, AG 21 and bicarbonate 15, WBC 14.1, potassium 4.9, lipase 19, negative urinalysis, temperature normal, tachycardia, oxygen saturation 98% on room air. Patient is admitted to stepdown as inpatient.   Subjective: 6/30 A/O 4, negative CP negative SOB. States DM uncontrolled secondary to being in prison for 4 months. Currently positive abdominal pain, positive N/V.    Assessment & Plan:   Principal Problem:   DKA, type 1 ([name]) Active Problems:   Depression   DM hyperosmolarity type I, uncontrolled ([name])   Tobacco use disorder   DKA (diabetic ketoacidoses) ([name])   SIRS (systemic inflammatory response syndrome) ([name])   GERD (gastroesophageal reflux disease)   Nausea & vomiting   Abdominal pain   DKA, type 1 uncontrolled with complications/Hyperglycemia due to type 1 diabetes -01/22/16 Hemoglobin A1c=  13.9, -Hemoglobin A1c pending -Repel pending -Lantus 10 units daily -Resistant SSI -Continue normal saline 119ml/hr  Nausea, vomiting, abdominal pain and possible hematemesis: - Lipase normal.  -Most likely factor diabetic gastroparesis secondary to poorly controlled diabetes. -Reglan 10 mg  TID -Zofran PRN -Abdominal pain treat with Tylenol and Toradol IV PRN  Depression:  -Stable, no suicidal or homicidal ideations.  -Stopped taking Doxepin  -observe closely  Tobacco abuse: -Did counseling about importance of quitting smoking -Nicotine patch  GERD: -Protonix as above  SIRS (systemic inflammatory response syndrome) ([name]):  -Patient meets criteria for sepsis with leukocytosis and tachycardia. No signs of infection. Likely due to DKA. Urinalysis negative. No respiratory symptoms. -IV fluid as above -Follow-up blood culture    DVT prophylaxis: SCD Code Status: Full Family Communication: None Disposition Plan: Resolution DKA and associated symptoms   Consultants:    Procedures/Significant Events:  None   VENTILATOR SETTINGS: None   Cultures 6/29 blood NGTD  Antimicrobials: Anti-infectives    None       Devices    LINES / TUBES:      Continuous Infusions: . sodium chloride 125 mL/hr at 06/15/17 2316  . dextrose 5 % and 0.45% NaCl 125 mL/hr at 06/16/17 0240  . insulin (NOVOLIN-R) infusion       Objective: Vitals:   06/16/17 0600 06/16/17 0700 06/16/17 0730 06/16/17 0830  BP: 121/75 114/84 128/85 121/87  Pulse: (!) 101 97 97 97  Resp:  (!) 21 12 15   Temp:      TempSrc:      SpO2: 99% 100% 100% 100%  Weight:      Height:  Intake/Output Summary (Last 24 hours) at 06/16/17 0854 Last data filed at 06/16/17 0120  Gross per 24 hour  Intake             3000 ml  Output              850 ml  Net             2150 ml   Filed Weights   06/15/17 1726  Weight: 200 lb (90.7 kg)    Examination:  General: A/O 4, No acute  respiratory distress Eyes: negative scleral hemorrhage, negative anisocoria, negative icterus ENT: Negative Runny nose, negative gingival bleeding, Neck:  Negative scars, masses, torticollis, lymphadenopathy, JVD Lungs: Clear to auscultation bilaterally without wheezes or crackles Cardiovascular: Regular rate and rhythm without murmur gallop or rub normal S1 and S2 Abdomen: Positive abdominal pain, nondistended, positive soft, bowel sounds, no rebound, no ascites, no appreciable mass Extremities: No significant cyanosis, clubbing, or edema bilateral lower extremities Skin: Negative rashes, lesions, ulcers Psychiatric:  Negative depression, negative anxiety, negative fatigue, negative mania  Central nervous system:  Cranial nerves II through XII intact, tongue/uvula midline, all extremities muscle strength 5/5, sensation intact throughout, negative dysarthria, negative expressive aphasia, negative receptive aphasia.  .     Data Reviewed: Care during the described time interval was provided by me .  I have reviewed this patient's available data, including medical history, events of note, physical examination, and all test results as part of my evaluation. I have personally reviewed and interpreted all radiology studies.  CBC:  Recent Labs Lab 06/15/17 1731  WBC 14.1*  HGB 13.2  HCT 40.4  MCV 87.4  PLT 527*   Basic Metabolic Panel:  Recent Labs Lab 06/10/17 1118 06/15/17 1731 06/15/17 2320 06/16/17 0242 06/16/17 0609  NA 139 133* 131* 136 137  K 4.1 4.9 5.1 4.5 4.1  CL 102 97* 99* 108 109  CO2 30 15* 13* 18* 20*  GLUCOSE 266* 501* 452* 214* 159*  BUN 10 15 17 13 10   CREATININE 0.74 1.20 1.46* 1.02 0.80  CALCIUM 8.9 9.1 8.7* 8.1* 8.0*   GFR: Estimated Creatinine Clearance: 149.8 mL/min (by C-G formula based on SCr of 0.8 mg/dL). Liver Function Tests:  Recent Labs Lab 06/09/17 1827 06/15/17 1731  AST 110* 26  ALT 89* 80*  ALKPHOS 73 108  BILITOT 0.3 1.4*  PROT  5.0* 7.1  ALBUMIN 2.8* 4.1    Recent Labs Lab 06/09/17 1827 06/15/17 1731  LIPASE 20 19   No results for input(s): AMMONIA in the last 168 hours. Coagulation Profile:  Recent Labs Lab 06/15/17 2320  INR 1.08   Cardiac Enzymes: No results for input(s): CKTOTAL, CKMB, CKMBINDEX, TROPONINI in the last 168 hours. BNP (last 3 results) No results for input(s): PROBNP in the last 8760 hours. HbA1C: No results for input(s): HGBA1C in the last 72 hours. CBG:  Recent Labs Lab 06/16/17 0234 06/16/17 0417 06/16/17 0525 06/16/17 0700 06/16/17 0807  GLUCAP 204* 166* 160* 151* 135*   Lipid Profile: No results for input(s): CHOL, HDL, LDLCALC, TRIG, CHOLHDL, LDLDIRECT in the last 72 hours. Thyroid Function Tests: No results for input(s): TSH, T4TOTAL, FREET4, T3FREE, THYROIDAB in the last 72 hours. Anemia Panel: No results for input(s): VITAMINB12, FOLATE, FERRITIN, TIBC, IRON, RETICCTPCT in the last 72 hours. Urine analysis:    Component Value Date/Time   COLORURINE STRAW (A) 06/15/2017 1937   APPEARANCEUR CLEAR 06/15/2017 1937   LABSPEC 1.024 06/15/2017 1937   PHURINE 5.0  06/15/2017 1937   GLUCOSEU >=500 (A) 06/15/2017 1937   HGBUR NEGATIVE 06/15/2017 1937   BILIRUBINUR NEGATIVE 06/15/2017 1937   BILIRUBINUR negative 03/29/2015 1114   KETONESUR 80 (A) 06/15/2017 1937   PROTEINUR NEGATIVE 06/15/2017 1937   UROBILINOGEN 0.2 05/17/2015 1810   NITRITE NEGATIVE 06/15/2017 1937   LEUKOCYTESUR NEGATIVE 06/15/2017 1937   Sepsis Labs: @LABRCNTIP (procalcitonin:4,lacticidven:4)  ) Recent Results (from the past 240 hour(s))  MRSA PCR Screening     Status: None   Collection Time: 06/06/17 10:52 PM  Result Value Ref Range Status   MRSA by PCR NEGATIVE NEGATIVE Final    Comment:        The GeneXpert MRSA Assay (FDA approved for NASAL specimens only), is one component of a comprehensive MRSA colonization surveillance program. It is not intended to diagnose MRSA infection  nor to guide or monitor treatment for MRSA infections.   Culture, blood (routine x 2)     Status: None   Collection Time: 06/06/17 11:11 PM  Result Value Ref Range Status   Specimen Description BLOOD RIGHT ARM  Final   Special Requests   Final    BOTTLES DRAWN AEROBIC ONLY Blood Culture adequate volume   Culture NO GROWTH 5 DAYS  Final   Report Status 06/12/2017 FINAL  Final  Culture, blood (routine x 2)     Status: None   Collection Time: 06/06/17 11:19 PM  Result Value Ref Range Status   Specimen Description BLOOD RIGHT HAND  Final   Special Requests   Final    BOTTLES DRAWN AEROBIC ONLY Blood Culture adequate volume   Culture NO GROWTH 5 DAYS  Final   Report Status 06/12/2017 FINAL  Final         Radiology Studies: No results found.      Scheduled Meds: . metoCLOPramide (REGLAN) injection  5 mg Intravenous Q8H  . nicotine  21 mg Transdermal Daily  . pantoprazole  40 mg Oral BID  . senna-docusate  2 tablet Oral BID  . traZODone  300 mg Oral QHS   Continuous Infusions: . sodium chloride 125 mL/hr at 06/15/17 2316  . dextrose 5 % and 0.45% NaCl 125 mL/hr at 06/16/17 0240  . insulin (NOVOLIN-R) infusion       LOS: 1 day    Time spent: 40 minutes    WOODS, 06/18/17, MD [facility] Pager [phone]   If 7PM-7AM, please contact night-coverage [email or web address] Password [facility] 06/16/2017, 8:54 AM

## 2017-06-16 NOTE — ED Notes (Signed)
Stabilizer advised to stop insulin, insulin stopped.

## 2017-06-17 DIAGNOSIS — F17200 Nicotine dependence, unspecified, uncomplicated: Secondary | ICD-10-CM

## 2017-06-17 DIAGNOSIS — E1065 Type 1 diabetes mellitus with hyperglycemia: Secondary | ICD-10-CM

## 2017-06-17 DIAGNOSIS — R112 Nausea with vomiting, unspecified: Secondary | ICD-10-CM

## 2017-06-17 DIAGNOSIS — Z9119 Patient's noncompliance with other medical treatment and regimen: Secondary | ICD-10-CM

## 2017-06-17 DIAGNOSIS — R739 Hyperglycemia, unspecified: Secondary | ICD-10-CM

## 2017-06-17 DIAGNOSIS — F329 Major depressive disorder, single episode, unspecified: Secondary | ICD-10-CM

## 2017-06-17 DIAGNOSIS — E1069 Type 1 diabetes mellitus with other specified complication: Secondary | ICD-10-CM

## 2017-06-17 DIAGNOSIS — R109 Unspecified abdominal pain: Secondary | ICD-10-CM

## 2017-06-17 LAB — GLUCOSE, CAPILLARY
GLUCOSE-CAPILLARY: 111 mg/dL — AB (ref 65–99)
GLUCOSE-CAPILLARY: 180 mg/dL — AB (ref 65–99)
GLUCOSE-CAPILLARY: 225 mg/dL — AB (ref 65–99)
Glucose-Capillary: 148 mg/dL — ABNORMAL HIGH (ref 65–99)
Glucose-Capillary: 168 mg/dL — ABNORMAL HIGH (ref 65–99)
Glucose-Capillary: 195 mg/dL — ABNORMAL HIGH (ref 65–99)
Glucose-Capillary: 224 mg/dL — ABNORMAL HIGH (ref 65–99)

## 2017-06-17 LAB — HEMOGLOBIN A1C
HEMOGLOBIN A1C: 10.8 % — AB (ref 4.8–5.6)
Mean Plasma Glucose: 263 mg/dL

## 2017-06-17 NOTE — Progress Notes (Addendum)
Patient refusing CBG's this am, Dr Nelson Chimes notified. Will continue to monitor patient.

## 2017-06-17 NOTE — Progress Notes (Signed)
PROGRESS NOTE    [name]  KZS:[id number] DOB: 03-May-1986 DOA: 06/15/2017 PCP: Fleet Contras, MD   Brief Narrative:  32 mL with past medical history of type 1 diabetes, history of medical noncompliance and polysubstance abuse, schizophrenia, depression came to the ER with complaints of nausea vomiting abdominal pain. She was found to have metabolic acidosis and in diabetic ketoacidosis. He was started on insulin protocol and later on transitioned to subcutaneous Lantus as his gap closed.   Assessment & Plan:   Principal Problem:   DKA, type 1 ([name]) Active Problems:   Depression   DM hyperosmolarity type I, uncontrolled ([name])   Tobacco use disorder   DKA (diabetic ketoacidoses) ([name])   SIRS (systemic inflammatory response syndrome) ([name])   GERD (gastroesophageal reflux disease)   Nausea & vomiting   Abdominal pain   Hyperglycemia  Uncontrolled diabetes type 1/hyperglycemia DKA, resolved -Continue Lantus 10 units daily and up titrate as needed. This morning he has refused to get Accu-Cheks. I have clearly explained to him if he continues to refuse care we will be unable to help him. -Continue IV fluids as needed. Accu-Cheks and sliding scale Apparently he was here in the ER a few days ago and was sent home with a prescription of glucometer which has issues with diagnoses code, I confirmed this with his pharmacy. He will need new prescription for glucometer, lancets and test strips at the time of discharge. This can be hand written if needed with the correct diagnosis called. -A1c done on 6/30-10 0.8  Hematemesis? Nonspecific abdominal pain/nausea and vomiting-concerning for gastroparesis -Continue Reglan 10 mg 3 times a day prior to meals -Supportive care -We'll place him on PPI. I will also asked him to show hematemesis if he has any further episode.  History of depression -Observe closely  History of tobacco use and polysubstance abuse -Counseling provided to quit  alcohol and illicit substances -Nicotine patch as needed  History of GERD -On Protonix   Medical noncompliance-counseled to be compliant with his medication. I will extensively talked to him that if a continues to remain noncompliant and have uncontrolled diabetes it can lead to several issues including ophthalmopathy, neuropathy, nephropathy and other chronic conditions eventually leading to death.   DVT prophylaxis: SCDs Code Status: Full code Family Communication:  None at bedside Disposition Plan: Likely discharge next 24 hours  Consultants:   None  Procedures:   None  Antimicrobials:   None   Subjective: Patient states this morning he had an episode of hemoptysis due to feeling of nausea and vomiting over last several days. He states he had about a cupful of bright red blood and felt little dizzy but he is not sure to any nursing staff or me. He also refused to get Accu-Chek done this morning as he states he was sleeping and does not want to be bothered. States he's been taking his home insulin without checking his blood sugar because he does not have a glucometer. Apparently when he was given glucometer last time in the ER here about a week ago had incorrect diagnosis code therefore he was unable to fill it at his pharmacy. I have confirmed this with his pharmacist at the pharmacy.  Objective: Vitals:   06/16/17 1100 06/16/17 1729 06/17/17 0026 06/17/17 0411  BP: 115/71 129/80 129/80 116/74  Pulse: (!) 101 (!) 104 96 92  Resp: 16 18 18 18   Temp: 97.8 F (36.6 C) 98.2 F (36.8 C) 98.7 F (37.1 C) 98.5 F (  36.9 C)  TempSrc: Oral Oral Oral Oral  SpO2: 100% 100% 99% 96%  Weight: 93.1 kg (205 lb 3.2 oz)     Height:        Intake/Output Summary (Last 24 hours) at 06/17/17 1302 Last data filed at 06/17/17 0900  Gross per 24 hour  Intake          4287.09 ml  Output             1725 ml  Net          2562.09 ml   Filed Weights   06/15/17 1726 06/16/17 1100    Weight: 90.7 kg (200 lb) 93.1 kg (205 lb 3.2 oz)    Examination:  General exam: Appears calm and uncomfortable due to feeling of nausea  Respiratory system: Clear to auscultation. Respiratory effort normal. Cardiovascular system: S1 & S2 heard, RRR. No JVD, murmurs, rubs, gallops or clicks. No pedal edema. Gastrointestinal system: Abdomen is nondistended, soft and nontender. No organomegaly or masses felt. Normal bowel sounds heard. Central nervous system: Alert and oriented. No focal neurological deficits. Extremities: Symmetric 5 x 5 power. Skin: No rashes, lesions or ulcers Psychiatry: Judgement and insight appear normal. Mood & affect appropriate.     Data Reviewed:   CBC:  Recent Labs Lab 06/15/17 1731  WBC 14.1*  HGB 13.2  HCT 40.4  MCV 87.4  PLT 527*   Basic Metabolic Panel:  Recent Labs Lab 06/15/17 1731 06/15/17 2320 06/16/17 0242 06/16/17 0609 06/16/17 1132  NA 133* 131* 136 137 135  K 4.9 5.1 4.5 4.1 4.2  CL 97* 99* 108 109 106  CO2 15* 13* 18* 20* 20*  GLUCOSE 501* 452* 214* 159* 205*  BUN 15 17 13 10 9   CREATININE 1.20 1.46* 1.02 0.80 0.80  CALCIUM 9.1 8.7* 8.1* 8.0* 8.5*   GFR: Estimated Creatinine Clearance: 149.8 mL/min (by C-G formula based on SCr of 0.8 mg/dL). Liver Function Tests:  Recent Labs Lab 06/15/17 1731  AST 26  ALT 80*  ALKPHOS 108  BILITOT 1.4*  PROT 7.1  ALBUMIN 4.1    Recent Labs Lab 06/15/17 1731  LIPASE 19   No results for input(s): AMMONIA in the last 168 hours. Coagulation Profile:  Recent Labs Lab 06/15/17 2320  INR 1.08   Cardiac Enzymes: No results for input(s): CKTOTAL, CKMB, CKMBINDEX, TROPONINI in the last 168 hours. BNP (last 3 results) No results for input(s): PROBNP in the last 8760 hours. HbA1C:  Recent Labs  06/16/17 1132  HGBA1C 10.8*   CBG:  Recent Labs Lab 06/16/17 2045 06/17/17 0028 06/17/17 0409 06/17/17 1043 06/17/17 1141  GLUCAP 125* 195* 148* 168* 180*   Lipid  Profile:  Recent Labs  06/16/17 1132  CHOL 191  HDL 32*  LDLCALC 122*  TRIG 186*  CHOLHDL 6.0   Thyroid Function Tests:  Recent Labs  06/16/17 1132  TSH 1.022   Anemia Panel: No results for input(s): VITAMINB12, FOLATE, FERRITIN, TIBC, IRON, RETICCTPCT in the last 72 hours. Sepsis Labs: No results for input(s): PROCALCITON, LATICACIDVEN in the last 168 hours.  No results found for this or any previous visit (from the past 240 hour(s)).       Radiology Studies: No results found.      Scheduled Meds: . insulin aspart  0-20 Units Subcutaneous Q4H  . insulin glargine  10 Units Subcutaneous Daily  . metoCLOPramide (REGLAN) injection  10 mg Intravenous Q8H  . nicotine  21 mg Transdermal Daily  .  pantoprazole  40 mg Oral BID  . senna-docusate  2 tablet Oral BID  . traZODone  300 mg Oral QHS   Continuous Infusions: . sodium chloride 125 mL/hr at 06/16/17 1930     LOS: 2 days    Time spent: 35 mins    Dagny Fiorentino Joline Maxcy, MD [facility] Pager [phone]   If 7PM-7AM, please contact night-coverage [email or web address] Password [email or web address] 06/17/2017, 1:02 PM

## 2017-06-18 DIAGNOSIS — Z91199 Patient's noncompliance with other medical treatment and regimen due to unspecified reason: Secondary | ICD-10-CM

## 2017-06-18 DIAGNOSIS — Z9119 Patient's noncompliance with other medical treatment and regimen: Secondary | ICD-10-CM

## 2017-06-18 DIAGNOSIS — K219 Gastro-esophageal reflux disease without esophagitis: Secondary | ICD-10-CM

## 2017-06-18 LAB — MAGNESIUM: Magnesium: 1.6 mg/dL — ABNORMAL LOW (ref 1.7–2.4)

## 2017-06-18 LAB — CBC
HCT: 30.7 % — ABNORMAL LOW (ref 39.0–52.0)
Hemoglobin: 10.4 g/dL — ABNORMAL LOW (ref 13.0–17.0)
MCH: 28 pg (ref 26.0–34.0)
MCHC: 33.9 g/dL (ref 30.0–36.0)
MCV: 82.5 fL (ref 78.0–100.0)
Platelets: 354 10*3/uL (ref 150–400)
RBC: 3.72 MIL/uL — AB (ref 4.22–5.81)
RDW: 12.3 % (ref 11.5–15.5)
WBC: 5.9 10*3/uL (ref 4.0–10.5)

## 2017-06-18 LAB — GLUCOSE, CAPILLARY
GLUCOSE-CAPILLARY: 123 mg/dL — AB (ref 65–99)
GLUCOSE-CAPILLARY: 239 mg/dL — AB (ref 65–99)
Glucose-Capillary: 138 mg/dL — ABNORMAL HIGH (ref 65–99)
Glucose-Capillary: 195 mg/dL — ABNORMAL HIGH (ref 65–99)
Glucose-Capillary: 230 mg/dL — ABNORMAL HIGH (ref 65–99)
Glucose-Capillary: 297 mg/dL — ABNORMAL HIGH (ref 65–99)

## 2017-06-18 LAB — BASIC METABOLIC PANEL
Anion gap: 6 (ref 5–15)
BUN: 9 mg/dL (ref 6–20)
CHLORIDE: 110 mmol/L (ref 101–111)
CO2: 25 mmol/L (ref 22–32)
CREATININE: 0.55 mg/dL — AB (ref 0.61–1.24)
Calcium: 8.5 mg/dL — ABNORMAL LOW (ref 8.9–10.3)
GFR calc non Af Amer: >60 mL/min (ref >60)
Glucose, Bld: 140 mg/dL — ABNORMAL HIGH (ref 65–99)
POTASSIUM: 3.4 mmol/L — AB (ref 3.5–5.1)
SODIUM: 141 mmol/L (ref 135–145)

## 2017-06-18 MED ORDER — GLUCOSE BLOOD VI STRP: ORAL_STRIP | 12 refills | Status: DC

## 2017-06-18 MED ORDER — INSULIN DETEMIR 100 UNIT/ML FLEXPEN: 20.0000 [IU] | PEN_INJECTOR | Freq: Every day | SUBCUTANEOUS | 11 refills | Status: DC

## 2017-06-18 MED ORDER — SODIUM CHLORIDE 0.9 % IV SOLN: INTRAVENOUS | Status: DC

## 2017-06-18 MED ORDER — MAGNESIUM OXIDE 400 (241.3 MG) MG PO TABS
800.0000 mg | ORAL_TABLET | Freq: Once | ORAL | Status: AC
  Administered 2017-06-18: 800 mg via ORAL
  Filled 2017-06-18: qty 2

## 2017-06-18 MED ORDER — ACCU-CHEK MULTICLIX LANCETS MISC: 5 refills | Status: DC

## 2017-06-18 MED ORDER — BLOOD GLUCOSE MONITOR KIT: PACK | 0 refills | Status: DC

## 2017-06-18 MED ORDER — POTASSIUM CHLORIDE CRYS ER 20 MEQ PO TBCR
40.0000 meq | EXTENDED_RELEASE_TABLET | Freq: Once | ORAL | Status: DC
  Filled 2017-06-18: qty 2

## 2017-06-18 NOTE — Progress Notes (Signed)
PROGRESS NOTE    [name]  TZG:[id number] DOB: Dec 09, 1986 DOA: 06/15/2017 PCP: Fleet Contras, MD   Brief Narrative:  32 mL with past medical history of type 1 diabetes, history of medical noncompliance and polysubstance abuse, schizophrenia, depression came to the ER with complaints of nausea vomiting abdominal pain. She was found to have metabolic acidosis and in diabetic ketoacidosis. He was started on insulin protocol and later on transitioned to subcutaneous Lantus as his gap closed.  Assessment & Plan:   Principal Problem:   DKA, type 1 ([name]) Active Problems:   Depression   DM hyperosmolarity type I, uncontrolled ([name])   Tobacco use disorder   DKA (diabetic ketoacidoses) ([name])   SIRS (systemic inflammatory response syndrome) ([name])   GERD (gastroesophageal reflux disease)   Nausea & vomiting   Abdominal pain   Hyperglycemia  Uncontrolled diabetes type 1/hyperglycemia DKA, resolved -Restart home dose of Lantus at the time of discharge -Glucometer, test strips and Lantus prescription written with ICD 10 diagnoses as requested by the pharmacist. -A1c done on 6/30-10 0.8  Hematemesis; resolved Nonspecific abdominal pain/nausea and vomiting-concerning for gastroparesis -Continue Reglan 10 mg 3 times a day prior to meals -Supportive care  History of depression -Observe closely  History of tobacco use and polysubstance abuse -Counseling provided to quit alcohol and illicit substances -Nicotine patch as needed  History of GERD -On Protonix  Patient continues to insist he wants to stay an extra day but I told him at this time he is medically stable for inpatient hospital care.  DVT prophylaxis: SCDs Code Status: Full code Family Communication:  None at bedside Disposition Plan: Discharge today  Consultants:   None  Procedures:   None  Antimicrobials:   None  Subjective: Today patient states he is feels generally weak and does not want to go home.  He does not have any complaints.  Objective: Vitals:   06/17/17 0411 06/17/17 1700 06/17/17 2114 06/18/17 0520  BP: 116/74 127/62 136/82 128/80  Pulse: 92 68 85 94  Resp: 18 18 18 18   Temp: 98.5 F (36.9 C) 98.2 F (36.8 C) 98.3 F (36.8 C) 98.6 F (37 C)  TempSrc: Oral Oral Oral Oral  SpO2: 96% 98% 97% 96%  Weight:      Height:        Intake/Output Summary (Last 24 hours) at 06/18/17 1012 Last data filed at 06/18/17 0700  Gross per 24 hour  Intake          2313.67 ml  Output             2575 ml  Net          -261.33 ml   Filed Weights   06/15/17 1726 06/16/17 1100  Weight: 90.7 kg (200 lb) 93.1 kg (205 lb 3.2 oz)    Examination:  General exam: Appears calm and Comfortable Respiratory system: Clear to auscultation. Respiratory effort normal. Cardiovascular system: S1 & S2 heard, RRR. No JVD, murmurs, rubs, gallops or clicks. No pedal edema. Gastrointestinal system: Abdomen is nondistended, soft and nontender. No organomegaly or masses felt. Normal bowel sounds heard. Central nervous system: Alert and oriented. No focal neurological deficits. Extremities: Symmetric 5 x 5 power. Skin: No rashes, lesions or ulcers Psychiatry: Judgement and insight appear normal. Mood & affect appropriate.   Data Reviewed:   CBC:  Recent Labs Lab 06/15/17 1731 06/18/17 0756  WBC 14.1* 5.9  HGB 13.2 10.4*  HCT 40.4 30.7*  MCV 87.4 82.5  PLT 527*  354   Basic Metabolic Panel:  Recent Labs Lab 06/15/17 2320 06/16/17 0242 06/16/17 0609 06/16/17 1132 06/18/17 0756  NA 131* 136 137 135 141  K 5.1 4.5 4.1 4.2 3.4*  CL 99* 108 109 106 110  CO2 13* 18* 20* 20* 25  GLUCOSE 452* 214* 159* 205* 140*  BUN 17 13 10 9 9   CREATININE 1.46* 1.02 0.80 0.80 0.55*  CALCIUM 8.7* 8.1* 8.0* 8.5* 8.5*  MG  --   --   --   --  1.6*   GFR: Estimated Creatinine Clearance: 149.8 mL/min (A) (by C-G formula based on SCr of 0.55 mg/dL (L)). Liver Function Tests:  Recent Labs Lab  06/15/17 1731  AST 26  ALT 80*  ALKPHOS 108  BILITOT 1.4*  PROT 7.1  ALBUMIN 4.1    Recent Labs Lab 06/15/17 1731  LIPASE 19   No results for input(s): AMMONIA in the last 168 hours. Coagulation Profile:  Recent Labs Lab 06/15/17 2320  INR 1.08   Cardiac Enzymes: No results for input(s): CKTOTAL, CKMB, CKMBINDEX, TROPONINI in the last 168 hours. BNP (last 3 results) No results for input(s): PROBNP in the last 8760 hours. HbA1C:  Recent Labs  06/16/17 1132  HGBA1C 10.8*   CBG:  Recent Labs Lab 06/17/17 1608 06/17/17 2014 06/17/17 2347 06/18/17 0358 06/18/17 0800  GLUCAP 225* 224* 111* 230* 123*   Lipid Profile:  Recent Labs  06/16/17 1132  CHOL 191  HDL 32*  LDLCALC 122*  TRIG 186*  CHOLHDL 6.0   Thyroid Function Tests:  Recent Labs  06/16/17 1132  TSH 1.022   Anemia Panel: No results for input(s): VITAMINB12, FOLATE, FERRITIN, TIBC, IRON, RETICCTPCT in the last 72 hours. Sepsis Labs: No results for input(s): PROCALCITON, LATICACIDVEN in the last 168 hours.  Recent Results (from the past 240 hour(s))  Culture, blood (Routine X 2) w Reflex to ID Panel     Status: None (Preliminary result)   Collection Time: 06/15/17 11:10 PM  Result Value Ref Range Status   Specimen Description BLOOD RIGHT HAND  Final   Special Requests IN PEDIATRIC BOTTLE Blood Culture adequate volume  Final   Culture NO GROWTH 1 DAY  Final   Report Status PENDING  Incomplete  Culture, blood (Routine X 2) w Reflex to ID Panel     Status: None (Preliminary result)   Collection Time: 06/15/17 11:25 PM  Result Value Ref Range Status   Specimen Description BLOOD LEFT ANTECUBITAL  Final   Special Requests IN PEDIATRIC BOTTLE Blood Culture adequate volume  Final   Culture NO GROWTH 1 DAY  Final   Report Status PENDING  Incomplete         Radiology Studies: No results found.      Scheduled Meds: . insulin aspart  0-20 Units Subcutaneous Q4H  . insulin glargine   10 Units Subcutaneous Daily  . magnesium oxide  800 mg Oral Once  . metoCLOPramide (REGLAN) injection  10 mg Intravenous Q8H  . nicotine  21 mg Transdermal Daily  . pantoprazole  40 mg Oral BID  . potassium chloride  40 mEq Oral Once  . senna-docusate  2 tablet Oral BID  . traZODone  300 mg Oral QHS   Continuous Infusions: . sodium chloride 125 mL/hr at 06/17/17 1638     LOS: 3 days    Time spent: 35 mins    Shameeka Silliman Joline Maxcy, MD [facility] Pager [phone]   If 7PM-7AM, please contact night-coverage [email or web address] Password  [email or web address] 06/18/2017, 10:12 AM

## 2017-06-18 NOTE — Care Management Note (Addendum)
Case Management Note  Patient Details  Name: [name] MRN: [id number] Date of Birth: Sep 14, 1986  Subjective/Objective:      Pt admitted with n/v and abdominal pain           Action/Plan:     Per pt - pta he was living alone in a hotel - denied barriers to returning to hotel at discharge.  Pt states the "new" prescriptions are the correct ones for the DM supplies at the [facility] where he gets his medications filled.  NO CM needs determined prior to discharge   Expected Discharge Date:  06/18/17               Expected Discharge Plan:  Home/Self Care  In-House Referral:     Discharge planning Services  CM Consult  Post Acute Care Choice:    Choice offered to:  Patient  DME Arranged:    DME Agency:     HH Arranged:  RN, Disease Management HH Agency:   [facility]  Status of Service:  Completed, signed off  If discussed at Long Length of Stay Meetings, dates discussed:   06/18/2017  Pt feels he would benefit from [facility] to help him manage his DM - pt chose [facility] - agency contacted and accepted (agency made aware of pt living in hotel).  CSW also consulted for meals on wheels information  PT eval ordered - pt actively working with PT - CM will continue to follow   Pt has discharge order.  CM notified that pt will likely appeal discharge.  CM spoke with pt and pt informed CM that he feels that he is being discharged too early because he still has weakness.  Pt declined PT eval in house - informed CM that regardless the recommendation "I'm still going to appeal - I need to know the number".  CM also spoke with bedside nurse - per nurse pt has been independently ambulatory in hospital room without complaints.  Attending made aware and still feels that pt is medically stable for discharge. CM provided additional copy of IM and showed pt the appeal number.   Additional Comments:  Cherylann Parr, RN 06/18/2017, 3:50 PM

## 2017-06-18 NOTE — Evaluation (Signed)
Physical Therapy Evaluation Patient Details Name: [name] MRN: [id number] DOB: 1986/02/20 Today's Date: 06/18/2017   History of Present Illness  [name] is a 31 y.o. male with medical history significant of type 1 diabetes, insulin noncompliance, DKA, polysubstance abuse including tobacco, cocaine and marijuana, amphetamine, schizophrenia, GERD, depression, anxiety, who presents with nausea, vomiting, abdominal pain.  Clinical Impression  Patient presents with decreased strength/endurance limiting mobility.  Do not feel he needs formal PT, but would benefit from follow up [facility] for medication, education as well as meals on wheels since pt relates he has no one to assist him with meals and he takes the bus to get to store, etc.  Today able to walk with little assist, but reported dizziness with ambulation, but VSS, however reports was feeling nauseated and did not eat lunch and RN had medicated.  Feel he can benefit from acute level therapies for continued mobility and education as well as RN assist for mobility.      Follow Up Recommendations No PT follow up;Other (comment) (could use meals on wheels and visiting RN)    Equipment Recommendations  None recommended by PT    Recommendations for Other Services       Precautions / Restrictions        Mobility  Bed Mobility Overal bed mobility: Independent                Transfers Overall transfer level: Independent Equipment used: None                Ambulation/Gait Ambulation/Gait assistance: Supervision;Min guard Ambulation Distance (Feet): 200 Feet Assistive device: None Gait Pattern/deviations: Step-through pattern     General Gait Details: pt c/o dizziness after walking in hallway so provided minguard for support/safety/ VSS throughout (HR108, SpO2 98%)  Stairs            Wheelchair Mobility    Modified Rankin (Stroke Patients Only)       Balance Overall balance assessment: Needs  assistance   Sitting balance-Leahy Scale: Normal     Standing balance support: No upper extremity supported Standing balance-Leahy Scale: Good                               Pertinent Vitals/Pain Pain Assessment: 0-10 Pain Score: 7  Pain Location: abdomen Pain Descriptors / Indicators: Sharp;Stabbing Pain Intervention(s): Monitored during session    Home Living Family/patient expects to be discharged to:: Other (Comment) (hotel) Living Arrangements: Alone               Additional Comments: tub shower with grabbars    Prior Function Level of Independence: Independent               Hand Dominance        Extremity/Trunk Assessment   Upper Extremity Assessment Upper Extremity Assessment: Overall WFL for tasks assessed    Lower Extremity Assessment Lower Extremity Assessment: Overall WFL for tasks assessed       Communication   Communication: No difficulties  Cognition Arousal/Alertness: Awake/alert Behavior During Therapy: WFL for tasks assessed/performed Overall Cognitive Status: Within Functional Limits for tasks assessed                                        General Comments General comments (skin integrity, edema, etc.): Educated on need for energy  conservation and spacing out activities when first home due to limited endurance/general weakness.     Exercises     Assessment/Plan    PT Assessment Patient needs continued PT services  PT Problem List Decreased strength;Decreased activity tolerance;Decreased mobility       PT Treatment Interventions Gait training;Therapeutic exercise;Patient/family education;Functional mobility training;Balance training;Therapeutic activities    PT Goals (Current goals can be found in the Care Plan section)  Acute Rehab PT Goals Patient Stated Goal: To get stronger PT Goal Formulation: With patient Time For Goal Achievement: 06/22/17 Potential to Achieve Goals: Good     Frequency Min 3X/week   Barriers to discharge Decreased caregiver support      Co-evaluation               AM-PAC PT "6 Clicks" Daily Activity  Outcome Measure Difficulty turning over in bed (including adjusting bedclothes, sheets and blankets)?: None Difficulty moving from lying on back to sitting on the side of the bed? : None Difficulty sitting down on and standing up from a chair with arms (e.g., wheelchair, bedside commode, etc,.)?: None Help needed moving to and from a bed to chair (including a wheelchair)?: None Help needed walking in hospital room?: A Little Help needed climbing 3-5 steps with a railing? : A Little 6 Click Score: 22    End of Session   Activity Tolerance: Patient limited by fatigue Patient left: in bed;with call bell/phone within reach   PT Visit Diagnosis: Other abnormalities of gait and mobility (R26.89)    Time: 1400-1425 PT Time Calculation (min) (ACUTE ONLY): 25 min   Charges:   PT Evaluation $PT Eval Moderate Complexity: 1 Procedure PT Treatments $Self Care/Home Management: 8-22   PT G CodesSheran Lawless, [place] [phone] 06/18/2017   Elray Mcgregor 06/18/2017, 4:06 PM

## 2017-06-19 DIAGNOSIS — E1010 Type 1 diabetes mellitus with ketoacidosis without coma: Principal | ICD-10-CM

## 2017-06-19 LAB — BASIC METABOLIC PANEL
ANION GAP: 8 (ref 5–15)
BUN: 7 mg/dL (ref 6–20)
CO2: 25 mmol/L (ref 22–32)
Calcium: 8.4 mg/dL — ABNORMAL LOW (ref 8.9–10.3)
Chloride: 107 mmol/L (ref 101–111)
Creatinine, Ser: 0.61 mg/dL (ref 0.61–1.24)
GFR calc Af Amer: >60 mL/min (ref >60)
Glucose, Bld: 212 mg/dL — ABNORMAL HIGH (ref 65–99)
POTASSIUM: 3.3 mmol/L — AB (ref 3.5–5.1)
Sodium: 140 mmol/L (ref 135–145)

## 2017-06-19 LAB — CBC
HEMATOCRIT: 31.5 % — AB (ref 39.0–52.0)
Hemoglobin: 10.6 g/dL — ABNORMAL LOW (ref 13.0–17.0)
MCH: 28.2 pg (ref 26.0–34.0)
MCHC: 33.7 g/dL (ref 30.0–36.0)
MCV: 83.8 fL (ref 78.0–100.0)
Platelets: 364 10*3/uL (ref 150–400)
RBC: 3.76 MIL/uL — ABNORMAL LOW (ref 4.22–5.81)
RDW: 12.6 % (ref 11.5–15.5)
WBC: 5.8 10*3/uL (ref 4.0–10.5)

## 2017-06-19 LAB — MAGNESIUM: Magnesium: 1.6 mg/dL — ABNORMAL LOW (ref 1.7–2.4)

## 2017-06-19 LAB — GLUCOSE, CAPILLARY
GLUCOSE-CAPILLARY: 151 mg/dL — AB (ref 65–99)
GLUCOSE-CAPILLARY: 165 mg/dL — AB (ref 65–99)
GLUCOSE-CAPILLARY: 161 mg/dL — AB (ref 65–99)

## 2017-06-19 MED ORDER — POTASSIUM CHLORIDE CRYS ER 20 MEQ PO TBCR: 40.0000 meq | EXTENDED_RELEASE_TABLET | Freq: Once | ORAL | Status: DC

## 2017-06-19 MED ORDER — POTASSIUM CHLORIDE 20 MEQ/15ML (10%) PO SOLN
40.0000 meq | Freq: Once | ORAL | Status: AC
  Administered 2017-06-19: 40 meq via ORAL
  Filled 2017-06-19: qty 30

## 2017-06-19 MED ORDER — MAGNESIUM OXIDE 400 (241.3 MG) MG PO TABS
800.0000 mg | ORAL_TABLET | Freq: Once | ORAL | Status: AC
  Administered 2017-06-19: 800 mg via ORAL
  Filled 2017-06-19: qty 2

## 2017-06-19 MED ORDER — ONDANSETRON 4 MG PO TBDP: 4.0000 mg | ORAL_TABLET | Freq: Three times a day (TID) | ORAL | 0 refills | Status: DC | PRN

## 2017-06-19 NOTE — Discharge Summary (Addendum)
Physician Discharge Summary  [name] WEX:[id number] DOB: 01-18-1986 DOA: 06/15/2017  PCP: Nolene Ebbs, MD  Admit date: 06/15/2017 Discharge date: 06/19/2017  Admitted From: Home Disposition: Home  Recommendations for Outpatient Follow-up:  1. Follow up with PCP in 1-2 weeks 2. New prescription of Lantus, glucometer, Accu-Cheks lancets and test strips  Home Health: Home RN Equipment/Devices: None Discharge Condition: Stable CODE STATUS: Full Diet recommendation: Carb modified  Brief/Interim Summary: 31 year old male with past medical history of type 1 diabetes, medical noncompliance and polysubstance abuse, schizophrenia, depression came to the ER with complaints of nausea vomiting abdominal pain. He was found to be in diabetic ketoacidosis therefore started on insulin protocol. Over the course of 24 hours his diabetic ketoacidosis resolved and he was transitioned to Lantus and sliding scale. His blood sugars were fairly well controlled while in the hospital. While in the hospital he mentioned he had one episode of hematemesis likely from persistent nausea and vomiting but it was not witnessed and did not have any further episodes. His hemoglobin remained stable while he was here. On the day of discharge he also reported of generalized weakness therefore he was evaluated by physical therapy who recommended no further follow-up. With the help of case management home RN was set up for further help. He denied any barriers returning to his hotel which is where he lives. I also spoke with his pharmacy to see why he was unable to get his glucometer, test strips and lancets and apparently on his previous prescription there was around diagnosis code which had used ICD-9. I have corrected this on the new prescription and spoke with the pharmacy or the widest patient to bring in physical Scripts. At this time patient is medically stable to be discharged with outpatient follow-up. He has reached  maximum benefit from in hospital stay. Today he does not have any complaints and states he feels much better.  Discharge Diagnoses:  Principal Problem:   DKA, type 1 ([place]) Active Problems:   Depression   DM hyperosmolarity type I, uncontrolled ([name])   Tobacco use disorder   DKA (diabetic ketoacidoses) ([name])   SIRS (systemic inflammatory response syndrome) ([name])   GERD (gastroesophageal reflux disease)   Nausea & vomiting   Abdominal pain   Hyperglycemia   Medically noncompliant  Uncontrolled diabetes Diabetic ketoacidosis, resolved -Continue Lantus as prescribed. Glucometer, test strips and lancets prescription given -Hemoglobin A1c 10.8 -Home RN set up by the case manager for assistance  Hematemesis -Resolved  Generalized weakness -Resolved. Evaluate by physical therapy. No further recommendations  History of depression -Not on any medications  History of GERD -On Protonix  Discharge Instructions   Allergies as of 06/19/2017      Reactions   Sulfa Antibiotics Other (See Comments)   Unknown childhood allergy      Medication List    STOP taking these medications   doxepin 10 MG/ML solution Commonly known as:  SINEQUAN     TAKE these medications   accu-chek multiclix lancets Use as directed   blood glucose meter kit and supplies Kit Dispense based on patient and insurance preference. Use up to four times daily as directed. (FOR ICD-9 250.00, 250.01). What changed:  Another medication with the same name was added. Make sure you understand how and when to take each.   blood glucose meter kit and supplies Kit Dispense based on patient and insurance preference. Use up to four times daily as directed. (FOR ICD-9 250.00, 250.01). What changed:  You were  already taking a medication with the same name, and this prescription was added. Make sure you understand how and when to take each.   feeding supplement (GLUCERNA SHAKE) Liqd Take 237 mLs by mouth 3 (three)  times daily between meals.   glucose blood test strip Use as instructed   insulin aspart 100 UNIT/ML FlexPen Commonly known as:  NOVOLOG FLEXPEN CBG 70 - 120: 0 units CBG 121 - 150: 2 units CBG 151 - 200: 3 units CBG 201 - 250: 5 units CBG 251 - 300: 8 units CBG 301 - 350: 11 units CBG 351 - 400: 15 units What changed:  how much to take  how to take this  when to take this  additional instructions  Another medication with the same name was removed. Continue taking this medication, and follow the directions you see here.   Insulin Detemir 100 UNIT/ML Pen Commonly known as:  LEVEMIR FLEXPEN Inject 20 Units into the skin daily at 10 pm. What changed:  how much to take   metoCLOPramide 10 MG tablet Commonly known as:  REGLAN Take 1 tablet (10 mg total) by mouth 3 (three) times daily with meals.   ondansetron 4 MG disintegrating tablet Commonly known as:  ZOFRAN ODT Take 1 tablet (4 mg total) by mouth every 8 (eight) hours as needed for nausea or vomiting.   ondansetron 4 MG tablet Commonly known as:  ZOFRAN Take 1 tablet (4 mg total) by mouth every 8 (eight) hours as needed for nausea or vomiting.   oxyCODONE 5 MG immediate release tablet Commonly known as:  Oxy IR/ROXICODONE Take 1 tablet (5 mg total) by mouth every 6 (six) hours as needed for moderate pain.   pantoprazole 40 MG tablet Commonly known as:  PROTONIX Take 1 tablet (40 mg total) by mouth daily.   Pen Needles 31G X 5 MM Misc 90 each by Does not apply route 4 (four) times daily -  before meals and at bedtime.   senna-docusate 8.6-50 MG tablet Commonly known as:  Senokot-S Take 2 tablets by mouth 2 (two) times daily.   traZODone 150 MG tablet Commonly known as:  DESYREL Take 300 mg by mouth at bedtime.      Follow-up Information    [facility].   Why:  registered nurse Contact information: [place] [address] [phone]        Nolene Ebbs, MD Follow up on 06/26/2017.   Specialty:  Internal Medicine Why:  appointment time 12:00pm Contact information: [place] [address] [phone]          Allergies  Allergen Reactions  . Sulfa Antibiotics Other (See Comments)    Unknown childhood allergy    On your next visit with your primary care physician please Get Medicines reviewed and adjusted.   Please request your Prim.MD to go over all Hospital Tests and Procedure/Radiological results at the follow up, please get all Hospital records sent to your Prim MD by signing hospital release before you go home.   If you experience worsening of your admission symptoms, develop shortness of breath, life threatening emergency, suicidal or homicidal thoughts you must seek medical attention immediately by calling 911 or calling your MD immediately  if symptoms less severe.  You Must read complete instructions/literature along with all the possible adverse reactions/side effects for all the Medicines you take and that have been prescribed to you. Take any new Medicines after you have completely  understood and accpet all the possible adverse reactions/side effects.   Do not drive, operate heavy machinery, perform activities at heights, swimming or participation in water activities or provide baby sitting services if your were admitted for syncope or siezures until you have seen by Primary MD or a Neurologist and advised to do so again.  Do not drive when taking Pain medications.    Do not take more than prescribed Pain, Sleep and Anxiety Medications  Special Instructions: If you have smoked or chewed Tobacco  in the last 2 yrs please stop smoking, stop any regular Alcohol  and or any Recreational drug use.  Wear Seat belts while driving.   Please note  You were cared for by a hospitalist during your hospital stay. If you have any questions about your discharge medications or the care you  received while you were in the hospital after you are discharged, you can call the unit and asked to speak with the hospitalist on call if the hospitalist that took care of you is not available. Once you are discharged, your primary care physician will handle any further medical issues. Please note that NO REFILLS for any discharge medications will be authorized once you are discharged, as it is imperative that you return to your primary care physician (or establish a relationship with a primary care physician if you do not have one) for your aftercare needs so that they can reassess your need for medications and monitor your lab values.   Increase activity slowly        Consultations:  Diabetic Coordinator   Procedures/Studies: Dg Chest 2 View  Result Date: 06/06/2017 CLINICAL DATA:  30 year old male with chest pain. EXAM: CHEST  2 VIEW COMPARISON:  None. FINDINGS: The heart size and mediastinal contours are within normal limits. Both lungs are clear. The visualized skeletal structures are unremarkable. IMPRESSION: No active cardiopulmonary disease. Electronically Signed   By: Anner Crete M.D.   On: 06/06/2017 19:00   Ct Angio Chest Pe W Or Wo Contrast  Result Date: 06/10/2017 CLINICAL DATA:  Sternal chest pain for 3 or 4 days. EXAM: CT ANGIOGRAPHY CHEST WITH CONTRAST TECHNIQUE: Multidetector CT imaging of the chest was performed using the standard protocol during bolus administration of intravenous contrast. Multiplanar CT image reconstructions and MIPs were obtained to evaluate the vascular anatomy. CONTRAST:  100 mL of Isovue 370 COMPARISON:  Chest x-ray June 06, 2017 FINDINGS: Cardiovascular: No coronary artery calcifications are identified. The heart is normal. The thoracic aorta is non aneurysmal with no atherosclerosis or dissection. No pulmonary emboli are identified. Mediastinum/Nodes: Mild gynecomastia. The thyroid is normal. The esophagus is unremarkable. No adenopathy.  Lungs/Pleura: Central airways are normal. No pneumothorax. Small bilateral pleural effusions with associated atelectasis are identified. There is a 5.7 mm nodule in the right lung on series 7, image 68. No other nodules. No masses. No suspicious infiltrates. Upper Abdomen: Limited views the upper abdomen are unremarkable. Musculoskeletal: No chest wall abnormality. No acute or significant osseous findings. Review of the MIP images confirms the above findings. IMPRESSION: 1. Small bilateral pleural effusions with compressive atelectasis. 2. No pulmonary emboli are identified. 3. No cause for the patient's symptoms is identified. 4. 5.7 mm nodule in the right lung. No follow-up needed if patient is low-risk. Non-contrast chest CT can be considered in 12 months if patient is high-risk. This recommendation follows the consensus statement: Guidelines for Management of Incidental Pulmonary Nodules Detected on CT Images: From the Fleischner Society 2017; Radiology  2017; [id number]. Electronically Signed   By: Dorise Bullion III M.D   On: 06/10/2017 15:46   Dg Chest Port 1 View  Result Date: 06/04/2017 CLINICAL DATA:  DKA EXAM: PORTABLE CHEST 1 VIEW COMPARISON:  06/03/2017 FINDINGS: The heart size and mediastinal contours are within normal limits. Both lungs are clear. The visualized skeletal structures are unremarkable. IMPRESSION: No active disease. Electronically Signed   By: Inez Catalina M.D.   On: 06/04/2017 11:13   Dg Chest Port 1 View  Result Date: 06/03/2017 CLINICAL DATA:  Acute onset of diabetic ketoacidosis. Initial encounter. EXAM: PORTABLE CHEST 1 VIEW COMPARISON:  Chest radiograph performed 11/05/2015 FINDINGS: The lungs are well-aerated. Minimal bibasilar atelectasis is noted. There is no evidence of pleural effusion or pneumothorax. The cardiomediastinal silhouette is within normal limits. No acute osseous abnormalities are seen. IMPRESSION: Minimal bibasilar atelectasis noted.  Lungs otherwise  clear. Electronically Signed   By: Garald Balding M.D.   On: 06/03/2017 04:24      Subjective:   Discharge Exam: Vitals:   06/19/17 0349 06/19/17 1052  BP: (!) 141/83 132/82  Pulse: 82 86  Resp:  18  Temp: 97.8 F (36.6 C) 98 F (36.7 C)   Vitals:   06/18/17 1701 06/18/17 2016 06/19/17 0349 06/19/17 1052  BP: (!) 151/96 126/75 (!) 141/83 132/82  Pulse: 79 92 82 86  Resp: _0 Temp: 98.2 F (36.8 C) 98.2 F (36.8 C) 97.8 F (36.6 C) 98 F (36.7 C)  TempSrc: Oral Oral  Oral  SpO2: 98% 99% 98% 98%  Weight:  90.5 kg (199 lb 9.6 oz)    Height:        General: Pt is alert, awake, not in acute distress Cardiovascular: RRR, S1/S2 +, no rubs, no gallops Respiratory: CTA bilaterally, no wheezing, no rhonchi Abdominal: Soft, NT, ND, bowel sounds + Extremities: no edema, no cyanosis    The results of significant diagnostics from this hospitalization (including imaging, microbiology, ancillary and laboratory) are listed below for reference.     Microbiology: Recent Results (from the past 240 hour(s))  Culture, blood (Routine X 2) w Reflex to ID Panel     Status: None (Preliminary result)   Collection Time: 06/15/17 11:10 PM  Result Value Ref Range Status   Specimen Description BLOOD RIGHT HAND  Final   Special Requests IN PEDIATRIC BOTTLE Blood Culture adequate volume  Final   Culture NO GROWTH 2 DAYS  Final   Report Status PENDING  Incomplete  Culture, blood (Routine X 2) w Reflex to ID Panel     Status: None (Preliminary result)   Collection Time: 06/15/17 11:25 PM  Result Value Ref Range Status   Specimen Description BLOOD LEFT ANTECUBITAL  Final   Special Requests IN PEDIATRIC BOTTLE Blood Culture adequate volume  Final   Culture NO GROWTH 2 DAYS  Final   Report Status PENDING  Incomplete     Labs: BNP (last 3 results) No results for input(s): BNP in the last 8760 hours. Basic Metabolic Panel:  Recent Labs Lab 06/16/17 0242 06/16/17 0609  06/16/17 1132 06/18/17 0756 06/19/17 0541  NA 136 137 135 141 140  K 4.5 4.1 4.2 3.4* 3.3*  CL 108 109 106 110 107  CO2 18* 20* 20* 25 25  GLUCOSE 214* 159* 205* 140* 212*  BUN _1 CREATININE 1.02 0.80 0.80 0.55* 0.61  CALCIUM 8.1* 8.0* 8.5* 8.5* 8.4*  MG  --   --   --  1.6* 1.6*   Liver Function Tests:  Recent Labs Lab 06/15/17 1731  AST 26  ALT 80*  ALKPHOS 108  BILITOT 1.4*  PROT 7.1  ALBUMIN 4.1    Recent Labs Lab 06/15/17 1731  LIPASE 19   No results for input(s): AMMONIA in the last 168 hours. CBC:  Recent Labs Lab 06/15/17 1731 06/18/17 0756 06/19/17 0541  WBC 14.1* 5.9 5.8  HGB 13.2 10.4* 10.6*  HCT 40.4 30.7* 31.5*  MCV 87.4 82.5 83.8  PLT 527* 354 364   Cardiac Enzymes: No results for input(s): CKTOTAL, CKMB, CKMBINDEX, TROPONINI in the last 168 hours. BNP: Invalid input(s): POCBNP CBG:  Recent Labs Lab 06/18/17 2010 06/18/17 2351 06/19/17 0346 06/19/17 0749 06/19/17 1138  GLUCAP 195* 138* 151* 165* 161*   D-Dimer No results for input(s): DDIMER in the last 72 hours. Hgb A1c No results for input(s): HGBA1C in the last 72 hours. Lipid Profile No results for input(s): CHOL, HDL, LDLCALC, TRIG, CHOLHDL, LDLDIRECT in the last 72 hours. Thyroid function studies No results for input(s): TSH, T4TOTAL, T3FREE, THYROIDAB in the last 72 hours.  Invalid input(s): FREET3 Anemia work up No results for input(s): VITAMINB12, FOLATE, FERRITIN, TIBC, IRON, RETICCTPCT in the last 72 hours. Urinalysis    Component Value Date/Time   COLORURINE STRAW (A) 06/15/2017 1937   APPEARANCEUR CLEAR 06/15/2017 1937   LABSPEC 1.024 06/15/2017 1937   PHURINE 5.0 06/15/2017 1937   GLUCOSEU >=500 (A) 06/15/2017 1937   HGBUR NEGATIVE 06/15/2017 1937   BILIRUBINUR NEGATIVE 06/15/2017 1937   BILIRUBINUR negative 03/29/2015 1114   KETONESUR 80 (A) 06/15/2017 1937   PROTEINUR NEGATIVE 06/15/2017 1937   UROBILINOGEN 0.2 05/17/2015 1810   NITRITE  NEGATIVE 06/15/2017 1937   LEUKOCYTESUR NEGATIVE 06/15/2017 1937   Sepsis Labs Invalid input(s): PROCALCITONIN,  WBC,  LACTICIDVEN Microbiology Recent Results (from the past 240 hour(s))  Culture, blood (Routine X 2) w Reflex to ID Panel     Status: None (Preliminary result)   Collection Time: 06/15/17 11:10 PM  Result Value Ref Range Status   Specimen Description BLOOD RIGHT HAND  Final   Special Requests IN PEDIATRIC BOTTLE Blood Culture adequate volume  Final   Culture NO GROWTH 2 DAYS  Final   Report Status PENDING  Incomplete  Culture, blood (Routine X 2) w Reflex to ID Panel     Status: None (Preliminary result)   Collection Time: 06/15/17 11:25 PM  Result Value Ref Range Status   Specimen Description BLOOD LEFT ANTECUBITAL  Final   Special Requests IN PEDIATRIC BOTTLE Blood Culture adequate volume  Final   Culture NO GROWTH 2 DAYS  Final   Report Status PENDING  Incomplete     Time coordinating discharge: Over 30 minutes  SIGNED:   Damita Lack, MD  [facility] 06/19/2017, 12:00 PM Pager   If 7PM-7AM, please contact night-coverage [email or web address] Password [email or web address]

## 2017-06-19 NOTE — Progress Notes (Signed)
Discharge instructions given to patient by Ryann Young RN ADON. After instructions were given, patient verbalized understanding. Prescriptions were given to patient, and some were sent Pharmacy of choice documented on AVS.  Staff accompanied patient to  an awaiting vehicle.

## 2017-06-21 LAB — CULTURE, BLOOD (ROUTINE X 2)
Culture: NO GROWTH
Culture: NO GROWTH
Special Requests: ADEQUATE
Special Requests: ADEQUATE

## 2017-06-29 NOTE — Discharge Summary (Signed)
Physician Discharge Summary  [name] EHU:[id number] DOB: 07-Oct-1986 DOA: 06/03/2017  PCP: Nolene Ebbs, MD  Admit date: 06/03/2017 Discharge date:06/05/2017  Time spent: 45 minutes  Recommendations for Outpatient Follow-up:  1. PCP in1  Week 2. Endocrine FU for uncontrolled DM/recurrent DKA   Discharge Diagnoses:  Active Problems:   DKA (diabetic ketoacidoses) [facility])   Discharge Condition:stable  Diet recommendation: diabetic  Filed Weights   06/03/17 0025 06/05/17 0550  Weight: 81.6 kg (180 lb) 88.8 kg (195 lb 11.2 oz)    History of present illness:  [name] is a 31 y.o. male with medical history significant of type 1 diabetes, insulin noncompliance, DKA, polysubstance abuse including tobacco, cocaine and marijuana, amphetamine, schizophrenia, GERD, depression, anxiety, who presented with nausea, vomiting, abdominal pain  Hospital Course:  DKA, type 1 ([place]): blood sugar 501, AG 21 and bicarbonate 15.  -This is likely due to insulin Noncompliance and dehydration secondary to ongoing nausea and vomiting. -treated with IVF, Insulin gtt per Glucomander protocol -resolved, lantus resumed, Last A1c 13.9 on 01/22/16,  -home dose increased to 45units and advised FU with Endocrinology -discharged home in a stable condition  Nausea, vomiting, abdominal pain  -resolved with Rx of DKA  Depression: Stable, no suicidal or homicidal ideations. Stopped taking Doxepin  -stable  Tobacco abuse: -Did counseling about importance of quitting smoking -Nicotine patch  GERD: -Protonix as above   Discharge Exam: Vitals:   06/05/17 0550 06/05/17 0822  BP: 124/73 122/74  Pulse: (!) 108 (!) 105  Resp:    Temp: 98.1 F (36.7 C) 98.2 F (36.8 C)    General: AAOx3 Cardiovascular: S1S2/RRR Respiratory: CTAB  Discharge Instructions    Discharge Medication List as of 06/05/2017 12:06 PM    CONTINUE these medications which have CHANGED   Details  blood  glucose meter kit and supplies KIT Dispense based on patient and insurance preference. Use up to four times daily as directed. (FOR ICD-9 250.00, 250.01)., Print    insulin glargine (LANTUS) 100 UNIT/ML injection Inject 0.45 mLs (45 Units total) into the skin at bedtime., Starting Tue 06/05/2017, Print    insulin lispro (HUMALOG) 100 UNIT/ML injection Inject 0-0.14 mLs (0-14 Units total) into the skin See admin instructions. Per sliding scale three times daily, Starting Tue 06/05/2017, Print    QUEtiapine (SEROQUEL) 300 MG tablet Take 1 tablet (300 mg total) by mouth at bedtime., Starting Tue 06/05/2017, No Print      CONTINUE these medications which have NOT CHANGED   Details  traZODone (DESYREL) 150 MG tablet Take 150 mg by mouth at bedtime., Until Discontinued, Historical Med      STOP taking these medications     ibuprofen (ADVIL,MOTRIN) 800 MG tablet      ondansetron (ZOFRAN ODT) 4 MG disintegrating tablet      pantoprazole (PROTONIX) 20 MG tablet      penicillin v potassium (VEETID) 500 MG tablet        Allergies  Allergen Reactions  . Sulfa Antibiotics Other (See Comments)    Unknown childhood allergy   Follow-up Information    Nolene Ebbs, MD. Schedule an appointment as soon as possible for a visit in 1 week(s).   Specialty:  Internal Medicine Contact information: [address] [place] [id number] [phone]            The results of significant diagnostics from this hospitalization (including imaging, microbiology, ancillary and laboratory) are listed below for reference.    Significant Diagnostic Studies: Dg  Chest 2 View  Result Date: 06/06/2017 CLINICAL DATA:  31 year old male with chest pain. EXAM: CHEST  2 VIEW COMPARISON:  None. FINDINGS: The heart size and mediastinal contours are within normal limits. Both lungs are clear. The visualized skeletal structures are unremarkable. IMPRESSION: No active cardiopulmonary disease. Electronically Signed    By: Anner Crete M.D.   On: 06/06/2017 19:00   Ct Angio Chest Pe W Or Wo Contrast  Result Date: 06/10/2017 CLINICAL DATA:  Sternal chest pain for 3 or 4 days. EXAM: CT ANGIOGRAPHY CHEST WITH CONTRAST TECHNIQUE: Multidetector CT imaging of the chest was performed using the standard protocol during bolus administration of intravenous contrast. Multiplanar CT image reconstructions and MIPs were obtained to evaluate the vascular anatomy. CONTRAST:  100 mL of Isovue 370 COMPARISON:  Chest x-ray June 06, 2017 FINDINGS: Cardiovascular: No coronary artery calcifications are identified. The heart is normal. The thoracic aorta is non aneurysmal with no atherosclerosis or dissection. No pulmonary emboli are identified. Mediastinum/Nodes: Mild gynecomastia. The thyroid is normal. The esophagus is unremarkable. No adenopathy. Lungs/Pleura: Central airways are normal. No pneumothorax. Small bilateral pleural effusions with associated atelectasis are identified. There is a 5.7 mm nodule in the right lung on series 7, image 68. No other nodules. No masses. No suspicious infiltrates. Upper Abdomen: Limited views the upper abdomen are unremarkable. Musculoskeletal: No chest wall abnormality. No acute or significant osseous findings. Review of the MIP images confirms the above findings. IMPRESSION: 1. Small bilateral pleural effusions with compressive atelectasis. 2. No pulmonary emboli are identified. 3. No cause for the patient's symptoms is identified. 4. 5.7 mm nodule in the right lung. No follow-up needed if patient is low-risk. Non-contrast chest CT can be considered in 12 months if patient is high-risk. This recommendation follows the consensus statement: Guidelines for Management of Incidental Pulmonary Nodules Detected on CT Images: From the Fleischner Society 2017; Radiology 2017; 284:228-243. Electronically Signed   By: Dorise Bullion III M.D   On: 06/10/2017 15:46   Dg Chest Port 1 View  Result Date:  06/04/2017 CLINICAL DATA:  DKA EXAM: PORTABLE CHEST 1 VIEW COMPARISON:  06/03/2017 FINDINGS: The heart size and mediastinal contours are within normal limits. Both lungs are clear. The visualized skeletal structures are unremarkable. IMPRESSION: No active disease. Electronically Signed   By: Inez Catalina M.D.   On: 06/04/2017 11:13   Dg Chest Port 1 View  Result Date: 06/03/2017 CLINICAL DATA:  Acute onset of diabetic ketoacidosis. Initial encounter. EXAM: PORTABLE CHEST 1 VIEW COMPARISON:  Chest radiograph performed 11/05/2015 FINDINGS: The lungs are well-aerated. Minimal bibasilar atelectasis is noted. There is no evidence of pleural effusion or pneumothorax. The cardiomediastinal silhouette is within normal limits. No acute osseous abnormalities are seen. IMPRESSION: Minimal bibasilar atelectasis noted.  Lungs otherwise clear. Electronically Signed   By: Garald Balding M.D.   On: 06/03/2017 04:24    Microbiology: No results found for this or any previous visit (from the past 240 hour(s)).   Labs: Basic Metabolic Panel: No results for input(s): NA, K, CL, CO2, GLUCOSE, BUN, CREATININE, CALCIUM, MG, PHOS in the last 168 hours. Liver Function Tests: No results for input(s): AST, ALT, ALKPHOS, BILITOT, PROT, ALBUMIN in the last 168 hours. No results for input(s): LIPASE, AMYLASE in the last 168 hours. No results for input(s): AMMONIA in the last 168 hours. CBC: No results for input(s): WBC, NEUTROABS, HGB, HCT, MCV, PLT in the last 168 hours. Cardiac Enzymes: No results for input(s): CKTOTAL, CKMB, CKMBINDEX, TROPONINI  in the last 168 hours. BNP: BNP (last 3 results) No results for input(s): BNP in the last 8760 hours.  ProBNP (last 3 results) No results for input(s): PROBNP in the last 8760 hours.  CBG: No results for input(s): GLUCAP in the last 168 hours.     SignedDomenic Polite MD.  [facility] 06/29/2017, 2:22 PM

## 2017-07-01 ENCOUNTER — Inpatient Hospital Stay (HOSPITAL_COMMUNITY)
Admission: EM | Admit: 2017-07-01 | Discharge: 2017-07-03 | DRG: 637 | Discharge status: Against Medical Advice | Payer: Medicare Other | Attending: Pulmonary Disease | Admitting: Pulmonary Disease

## 2017-07-01 ENCOUNTER — Encounter (HOSPITAL_COMMUNITY): Payer: Self-pay | Admitting: Emergency Medicine

## 2017-07-01 ENCOUNTER — Emergency Department (HOSPITAL_COMMUNITY): Payer: Medicare Other

## 2017-07-01 DIAGNOSIS — E860 Dehydration: Secondary | ICD-10-CM | POA: Diagnosis present

## 2017-07-01 DIAGNOSIS — J9600 Acute respiratory failure, unspecified whether with hypoxia or hypercapnia: Secondary | ICD-10-CM | POA: Diagnosis not present

## 2017-07-01 DIAGNOSIS — R4182 Altered mental status, unspecified: Secondary | ICD-10-CM | POA: Diagnosis present

## 2017-07-01 DIAGNOSIS — R402432 Glasgow coma scale score 3-8, at arrival to emergency department: Secondary | ICD-10-CM | POA: Diagnosis present

## 2017-07-01 DIAGNOSIS — F329 Major depressive disorder, single episode, unspecified: Secondary | ICD-10-CM | POA: Diagnosis present

## 2017-07-01 DIAGNOSIS — G43909 Migraine, unspecified, not intractable, without status migrainosus: Secondary | ICD-10-CM | POA: Diagnosis present

## 2017-07-01 DIAGNOSIS — M19011 Primary osteoarthritis, right shoulder: Secondary | ICD-10-CM | POA: Diagnosis present

## 2017-07-01 DIAGNOSIS — E878 Other disorders of electrolyte and fluid balance, not elsewhere classified: Secondary | ICD-10-CM | POA: Diagnosis not present

## 2017-07-01 DIAGNOSIS — F1210 Cannabis abuse, uncomplicated: Secondary | ICD-10-CM | POA: Diagnosis present

## 2017-07-01 DIAGNOSIS — A419 Sepsis, unspecified organism: Secondary | ICD-10-CM

## 2017-07-01 DIAGNOSIS — F17210 Nicotine dependence, cigarettes, uncomplicated: Secondary | ICD-10-CM | POA: Diagnosis present

## 2017-07-01 DIAGNOSIS — F1410 Cocaine abuse, uncomplicated: Secondary | ICD-10-CM | POA: Diagnosis present

## 2017-07-01 DIAGNOSIS — R680 Hypothermia, not associated with low environmental temperature: Secondary | ICD-10-CM | POA: Diagnosis present

## 2017-07-01 DIAGNOSIS — K219 Gastro-esophageal reflux disease without esophagitis: Secondary | ICD-10-CM | POA: Diagnosis present

## 2017-07-01 DIAGNOSIS — F209 Schizophrenia, unspecified: Secondary | ICD-10-CM | POA: Diagnosis present

## 2017-07-01 DIAGNOSIS — M160 Bilateral primary osteoarthritis of hip: Secondary | ICD-10-CM | POA: Diagnosis present

## 2017-07-01 DIAGNOSIS — Z9114 Patient's other noncompliance with medication regimen: Secondary | ICD-10-CM

## 2017-07-01 DIAGNOSIS — E870 Hyperosmolality and hypernatremia: Secondary | ICD-10-CM | POA: Diagnosis not present

## 2017-07-01 DIAGNOSIS — F1510 Other stimulant abuse, uncomplicated: Secondary | ICD-10-CM | POA: Diagnosis present

## 2017-07-01 DIAGNOSIS — E875 Hyperkalemia: Secondary | ICD-10-CM | POA: Diagnosis not present

## 2017-07-01 DIAGNOSIS — F411 Generalized anxiety disorder: Secondary | ICD-10-CM | POA: Diagnosis present

## 2017-07-01 DIAGNOSIS — E1010 Type 1 diabetes mellitus with ketoacidosis without coma: Principal | ICD-10-CM | POA: Diagnosis present

## 2017-07-01 DIAGNOSIS — I10 Essential (primary) hypertension: Secondary | ICD-10-CM | POA: Diagnosis present

## 2017-07-01 DIAGNOSIS — Z9119 Patient's noncompliance with other medical treatment and regimen: Secondary | ICD-10-CM

## 2017-07-01 DIAGNOSIS — M19012 Primary osteoarthritis, left shoulder: Secondary | ICD-10-CM | POA: Diagnosis present

## 2017-07-01 DIAGNOSIS — Z79899 Other long term (current) drug therapy: Secondary | ICD-10-CM

## 2017-07-01 DIAGNOSIS — N179 Acute kidney failure, unspecified: Secondary | ICD-10-CM

## 2017-07-01 LAB — COMPREHENSIVE METABOLIC PANEL
ALBUMIN: 3.6 g/dL (ref 3.5–5.0)
ALT: 19 U/L (ref 17–63)
AST: 14 U/L — AB (ref 15–41)
Alkaline Phosphatase: 158 U/L — ABNORMAL HIGH (ref 38–126)
BILIRUBIN TOTAL: 1.9 mg/dL — AB (ref 0.3–1.2)
BUN: 56 mg/dL — AB (ref 6–20)
CALCIUM: 8.3 mg/dL — AB (ref 8.9–10.3)
CHLORIDE: 87 mmol/L — AB (ref 101–111)
CO2: <7 mmol/L — ABNORMAL LOW (ref 22–32)
CREATININE: 4.39 mg/dL — AB (ref 0.61–1.24)
GFR calc Af Amer: 19 mL/min — ABNORMAL LOW (ref >60)
GFR calc non Af Amer: 16 mL/min — ABNORMAL LOW (ref >60)
Glucose, Bld: 1065 mg/dL (ref 65–99)
Potassium: >7.5 mmol/L (ref 3.5–5.1)
Sodium: 123 mmol/L — ABNORMAL LOW (ref 135–145)
Total Protein: 6 g/dL — ABNORMAL LOW (ref 6.5–8.1)

## 2017-07-01 LAB — URINALYSIS, ROUTINE W REFLEX MICROSCOPIC
BILIRUBIN URINE: NEGATIVE
Glucose, UA: >=500 mg/dL — AB
KETONES UR: 20 mg/dL — AB
Leukocytes, UA: NEGATIVE
Nitrite: NEGATIVE
Protein, ur: 100 mg/dL — AB
Specific Gravity, Urine: 1.015 (ref 1.005–1.030)
pH: 5 (ref 5.0–8.0)

## 2017-07-01 LAB — BASIC METABOLIC PANEL
BUN: 50 mg/dL — ABNORMAL HIGH (ref 6–20)
CALCIUM: 7.2 mg/dL — AB (ref 8.9–10.3)
CO2: <7 mmol/L — ABNORMAL LOW (ref 22–32)
Chloride: 114 mmol/L — ABNORMAL HIGH (ref 101–111)
Creatinine, Ser: 2.7 mg/dL — ABNORMAL HIGH (ref 0.61–1.24)
GFR, EST AFRICAN AMERICAN: 34 mL/min — AB (ref >60)
GFR, EST NON AFRICAN AMERICAN: 30 mL/min — AB (ref >60)
GLUCOSE: 331 mg/dL — AB (ref 65–99)
POTASSIUM: 3.8 mmol/L (ref 3.5–5.1)
Sodium: 143 mmol/L (ref 135–145)

## 2017-07-01 LAB — GLUCOSE, CAPILLARY
GLUCOSE-CAPILLARY: 436 mg/dL — AB (ref 65–99)
GLUCOSE-CAPILLARY: 385 mg/dL — AB (ref 65–99)
GLUCOSE-CAPILLARY: 285 mg/dL — AB (ref 65–99)
GLUCOSE-CAPILLARY: 241 mg/dL — AB (ref 65–99)
GLUCOSE-CAPILLARY: 265 mg/dL — AB (ref 65–99)
GLUCOSE-CAPILLARY: 183 mg/dL — AB (ref 65–99)
Glucose-Capillary: 494 mg/dL — ABNORMAL HIGH (ref 65–99)

## 2017-07-01 LAB — CBG MONITORING, ED
Glucose-Capillary: >600 mg/dL (ref 65–99)
Glucose-Capillary: >600 mg/dL (ref 65–99)

## 2017-07-01 LAB — ABO/RH: ABO/RH(D): O POS

## 2017-07-01 LAB — LACTIC ACID, PLASMA
LACTIC ACID, VENOUS: 1.6 mmol/L (ref 0.5–1.9)
Lactic Acid, Venous: 1.6 mmol/L (ref 0.5–1.9)

## 2017-07-01 LAB — CBC WITH DIFFERENTIAL/PLATELET
BASOS ABS: 0 10*3/uL (ref 0.0–0.1)
Basophils Relative: 0 %
EOS ABS: 0 10*3/uL (ref 0.0–0.7)
Eosinophils Relative: 0 %
HCT: 40.7 % (ref 39.0–52.0)
HEMOGLOBIN: 12.7 g/dL — AB (ref 13.0–17.0)
LYMPHS PCT: 11 %
Lymphs Abs: 6.2 10*3/uL — ABNORMAL HIGH (ref 0.7–4.0)
MCH: 28.9 pg (ref 26.0–34.0)
MCHC: 31.2 g/dL (ref 30.0–36.0)
MCV: 92.7 fL (ref 78.0–100.0)
MONO ABS: 6.2 10*3/uL — AB (ref 0.1–1.0)
Monocytes Relative: 11 %
NEUTROS PCT: 78 %
Neutro Abs: 43.5 10*3/uL — ABNORMAL HIGH (ref 1.7–7.7)
PLATELETS: 525 10*3/uL — AB (ref 150–400)
RBC: 4.39 MIL/uL (ref 4.22–5.81)
RDW: 13.3 % (ref 11.5–15.5)
WBC: 55.9 10*3/uL — AB (ref 4.0–10.5)

## 2017-07-01 LAB — I-STAT CHEM 8, ED
BUN: 56 mg/dL — ABNORMAL HIGH (ref 6–20)
CALCIUM ION: 1.17 mmol/L (ref 1.15–1.40)
Chloride: 96 mmol/L — ABNORMAL LOW (ref 101–111)
Creatinine, Ser: 3.3 mg/dL — ABNORMAL HIGH (ref 0.61–1.24)
Glucose, Bld: >700 mg/dL (ref 65–99)
HEMATOCRIT: 41 % (ref 39.0–52.0)
Hemoglobin: 13.9 g/dL (ref 13.0–17.0)
Potassium: 7.9 mmol/L (ref 3.5–5.1)
Sodium: 120 mmol/L — ABNORMAL LOW (ref 135–145)
TCO2: 5 mmol/L (ref 0–100)

## 2017-07-01 LAB — RAPID URINE DRUG SCREEN, HOSP PERFORMED
AMPHETAMINES: POSITIVE — AB
BENZODIAZEPINES: NOT DETECTED
Barbiturates: NOT DETECTED
COCAINE: NOT DETECTED
OPIATES: NOT DETECTED
Tetrahydrocannabinol: NOT DETECTED

## 2017-07-01 LAB — PROTIME-INR
INR: 1.49
PROTHROMBIN TIME: 18.1 s — AB (ref 11.4–15.2)

## 2017-07-01 LAB — TYPE AND SCREEN
ABO/RH(D): O POS
ANTIBODY SCREEN: NEGATIVE

## 2017-07-01 LAB — BLOOD GAS, VENOUS
O2 SAT: 63 %
PATIENT TEMPERATURE: 98.6
pH, Ven: 6.997 — CL (ref 7.250–7.430)
pO2, Ven: 33.7 mmHg (ref 32.0–45.0)

## 2017-07-01 LAB — APTT: APTT: 27 s (ref 24–36)

## 2017-07-01 LAB — I-STAT CG4 LACTIC ACID, ED: LACTIC ACID, VENOUS: 2.13 mmol/L — AB (ref 0.5–1.9)

## 2017-07-01 LAB — I-STAT TROPONIN, ED: TROPONIN I, POC: 0.01 ng/mL (ref 0.00–0.08)

## 2017-07-01 LAB — PHOSPHORUS: Phosphorus: 3.5 mg/dL (ref 2.5–4.6)

## 2017-07-01 LAB — MRSA PCR SCREENING: MRSA by PCR: NEGATIVE

## 2017-07-01 LAB — CK: Total CK: 209 U/L (ref 49–397)

## 2017-07-01 LAB — SALICYLATE LEVEL: Salicylate Lvl: <7 mg/dL (ref 2.8–30.0)

## 2017-07-01 LAB — ACETAMINOPHEN LEVEL: Acetaminophen (Tylenol), Serum: <10 ug/mL — ABNORMAL LOW (ref 10–30)

## 2017-07-01 LAB — PROCALCITONIN: Procalcitonin: 5.22 ng/mL

## 2017-07-01 LAB — TROPONIN I

## 2017-07-01 MED ORDER — ENOXAPARIN SODIUM 30 MG/0.3ML ~~LOC~~ SOLN: 30.0000 mg | SUBCUTANEOUS | Status: DC

## 2017-07-01 MED ORDER — SODIUM CHLORIDE 0.9 % IV SOLN
INTRAVENOUS | Status: DC
  Administered 2017-07-01: 5.4 [IU]/h via INTRAVENOUS
  Administered 2017-07-01: 11.3 [IU]/h via INTRAVENOUS
  Administered 2017-07-02: 9.7 [IU]/h via INTRAVENOUS
  Filled 2017-07-01 (×3): qty 1

## 2017-07-01 MED ORDER — VANCOMYCIN HCL IN DEXTROSE 1-5 GM/200ML-% IV SOLN: 1000.0000 mg | Freq: Once | INTRAVENOUS | Status: DC

## 2017-07-01 MED ORDER — SODIUM CHLORIDE 0.9 % IV SOLN
INTRAVENOUS | Status: DC
  Administered 2017-07-01: 16:00:00 via INTRAVENOUS

## 2017-07-01 MED ORDER — PIPERACILLIN-TAZOBACTAM 3.375 G IVPB
3.3750 g | Freq: Three times a day (TID) | INTRAVENOUS | Status: DC
  Administered 2017-07-01 – 2017-07-03 (×6): 3.375 g via INTRAVENOUS
  Filled 2017-07-01 (×6): qty 50

## 2017-07-01 MED ORDER — SODIUM BICARBONATE 8.4 % IV SOLN
100.0000 meq | Freq: Once | INTRAVENOUS | Status: AC
  Administered 2017-07-01: 100 meq via INTRAVENOUS

## 2017-07-01 MED ORDER — SODIUM BICARBONATE 8.4 % IV SOLN
50.0000 meq | Freq: Once | INTRAVENOUS | Status: AC
  Administered 2017-07-01: 50 meq via INTRAVENOUS

## 2017-07-01 MED ORDER — CHLORHEXIDINE GLUCONATE 0.12 % MT SOLN
15.0000 mL | Freq: Two times a day (BID) | OROMUCOSAL | Status: DC
  Administered 2017-07-02 (×2): 15 mL via OROMUCOSAL
  Filled 2017-07-01: qty 15

## 2017-07-01 MED ORDER — ORAL CARE MOUTH RINSE
15.0000 mL | Freq: Two times a day (BID) | OROMUCOSAL | Status: DC
  Administered 2017-07-02 – 2017-07-03 (×4): 15 mL via OROMUCOSAL

## 2017-07-01 MED ORDER — SODIUM BICARBONATE 8.4 % IV SOLN
INTRAVENOUS | Status: AC
  Administered 2017-07-01: 50 meq via INTRAVENOUS
  Filled 2017-07-01: qty 50

## 2017-07-01 MED ORDER — SODIUM CHLORIDE 0.9 % IV BOLUS (SEPSIS)
1000.0000 mL | Freq: Once | INTRAVENOUS | Status: AC
  Administered 2017-07-01: 1000 mL via INTRAVENOUS

## 2017-07-01 MED ORDER — SODIUM BICARBONATE 8.4 % IV SOLN
INTRAVENOUS | Status: AC
  Filled 2017-07-01: qty 100

## 2017-07-01 MED ORDER — STERILE WATER FOR INJECTION IV SOLN
INTRAVENOUS | Status: DC
  Administered 2017-07-01 – 2017-07-02 (×2): via INTRAVENOUS
  Filled 2017-07-01 (×3): qty 850

## 2017-07-01 MED ORDER — PHENYLEPHRINE HCL-NACL 10-0.9 MG/250ML-% IV SOLN
0.0000 ug/min | INTRAVENOUS | Status: DC
  Administered 2017-07-01: 40 ug/min via INTRAVENOUS
  Administered 2017-07-01: 20 ug/min via INTRAVENOUS
  Administered 2017-07-02: 30 ug/min via INTRAVENOUS
  Filled 2017-07-01 (×3): qty 250

## 2017-07-01 MED ORDER — SODIUM CHLORIDE 0.9 % IV SOLN
INTRAVENOUS | Status: DC
  Administered 2017-07-01: 12:00:00 via INTRAVENOUS

## 2017-07-01 MED ORDER — SODIUM CHLORIDE 0.9 % IV SOLN
1250.0000 mg | INTRAVENOUS | Status: DC
  Filled 2017-07-01: qty 1250

## 2017-07-01 MED ORDER — SODIUM CHLORIDE 0.9 % IV BOLUS (SEPSIS)
2000.0000 mL | Freq: Once | INTRAVENOUS | Status: AC
  Administered 2017-07-01: 2000 mL via INTRAVENOUS

## 2017-07-01 MED ORDER — PIPERACILLIN-TAZOBACTAM 3.375 G IVPB 30 MIN
3.3750 g | Freq: Once | INTRAVENOUS | Status: AC
  Administered 2017-07-01: 3.375 g via INTRAVENOUS
  Filled 2017-07-01: qty 50

## 2017-07-01 MED ORDER — VANCOMYCIN HCL 10 G IV SOLR
1750.0000 mg | Freq: Once | INTRAVENOUS | Status: AC
  Administered 2017-07-01: 1750 mg via INTRAVENOUS
  Filled 2017-07-01: qty 1750

## 2017-07-01 NOTE — ED Provider Notes (Addendum)
[facility] DEPT Provider Note   CSN: [id number] Arrival date & time: 07/01/17  1021     History   Chief Complaint No chief complaint on file.  Chief complaint altered mental status HPI [name] is a 31 y.o. male.  HPI level V caveat altered mental status history is obtained from paramedics. I attempted to call number on patient's chart. No answer. Paramedics report the patient was found unresponsive in his home. EMS was called by other persons staying at the home. EMS reports that patient's blood sugar read high. No treatment prior to coming here.  Past Medical History:  Diagnosis Date  . Anemia   . Anxiety   . Arthritis    "both hips; both shoulders" (06/06/2017)  . Chicken pox   . Childhood asthma   . Chronic pain   . Depression   . DKA (diabetic ketoacidoses) ([place]) 11/18/2014  . GERD (gastroesophageal reflux disease)   . Hypertension   . Migraine    "a few/year" (06/06/2017)  . Noncompliance with medication regimen   . Pneumonia    "several times" (06/06/2017)  . Polysubstance abuse   . Schizo affective schizophrenia ([place])   . Scoliosis   . Type I diabetes mellitus [facility]     Patient Active Problem List   Diagnosis Date Noted  . Medically noncompliant   . Hyperglycemia 06/16/2017  . Nausea & vomiting 06/15/2017  . Abdominal pain 06/15/2017  . SIRS (systemic inflammatory response syndrome) ([place]) 06/07/2017  . Chest pain 06/07/2017  . GERD (gastroesophageal reflux disease) 06/07/2017  . DKA, type 1 ([place]) 06/06/2017  . DKA (diabetic ketoacidoses) ([place]) 06/03/2017  . Substance abuse 05/30/2016  . Substance induced mood disorder ([place]) 05/30/2016  . Tobacco use disorder 12/24/2015  . DM hyperosmolarity type I, uncontrolled ([place]) 12/19/2015  . Type I diabetes mellitus with hyperosmolarity ([place]) 12/19/2015  . Left shoulder pain 08/19/2015  . Generalized headache 07/22/2015  . Bilateral hip bursitis 05/26/2015  . Depression   . Generalized anxiety  disorder 03/29/2015  . Undifferentiated schizophrenia ([place])   . Hip pain, bilateral 11/18/2014  . Hyponatremia 06/17/2012  . Dehydration 06/17/2012    Past Surgical History:  Procedure Laterality Date  . CARDIAC CATHETERIZATION  11/2016  . INCISION AND DRAINAGE ABSCESS Left 11/2011   "MRSA removed off my thumb"       Home Medications    Prior to Admission medications   Medication Sig Start Date End Date Taking? Authorizing Provider  blood glucose meter kit and supplies KIT Dispense based on patient and insurance preference. Use up to four times daily as directed. (FOR ICD-9 250.00, 250.01). 06/18/17   Amin, Jeanella Flattery, MD  blood glucose meter kit and supplies KIT Dispense based on patient and insurance preference. Use up to four times daily as directed. (FOR ICD-9 250.00, 250.01). 06/18/17   Amin, Jeanella Flattery, MD  feeding supplement, GLUCERNA SHAKE, (GLUCERNA SHAKE) LIQD Take 237 mLs by mouth 3 (three) times daily between meals. 06/09/17   Hosie Poisson, MD  glucose blood test strip Use as instructed 06/18/17   Amin, Ankit Chirag, MD  insulin aspart (NOVOLOG FLEXPEN) 100 UNIT/ML FlexPen CBG 70 - 120: 0 units CBG 121 - 150: 2 units CBG 151 - 200: 3 units CBG 201 - 250: 5 units CBG 251 - 300: 8 units CBG 301 - 350: 11 units CBG 351 - 400: 15 units Patient taking differently: Inject 10 Units into the skin 3 (three) times daily with meals. CBG 70 - 120:  0 units CBG 121 - 150: 2 units CBG 151 - 200: 3 units CBG 201 - 250: 5 units CBG 251 - 300: 8 units CBG 301 - 350: 11 units CBG 351 - 400: 15 units 06/11/17   Hosie Poisson, MD  Insulin Detemir (LEVEMIR FLEXPEN) 100 UNIT/ML Pen Inject 20 Units into the skin daily at 10 pm. 06/18/17   Amin, Jeanella Flattery, MD  Insulin Pen Needle (PEN NEEDLES) 31G X 5 MM MISC 90 each by Does not apply route 4 (four) times daily -  before meals and at bedtime. 06/11/17   Hosie Poisson, MD  Lancets (ACCU-CHEK MULTICLIX) lancets Use as directed 06/18/17   Amin,  Jeanella Flattery, MD  metoCLOPramide (REGLAN) 10 MG tablet Take 1 tablet (10 mg total) by mouth 3 (three) times daily with meals. 06/11/17 06/11/18  Hosie Poisson, MD  ondansetron (ZOFRAN ODT) 4 MG disintegrating tablet Take 1 tablet (4 mg total) by mouth every 8 (eight) hours as needed for nausea or vomiting. 06/19/17   Amin, Jeanella Flattery, MD  ondansetron (ZOFRAN) 4 MG tablet Take 1 tablet (4 mg total) by mouth every 8 (eight) hours as needed for nausea or vomiting. 06/11/17 06/11/18  Hosie Poisson, MD  oxyCODONE (OXY IR/ROXICODONE) 5 MG immediate release tablet Take 1 tablet (5 mg total) by mouth every 6 (six) hours as needed for moderate pain. 06/11/17   Hosie Poisson, MD  pantoprazole (PROTONIX) 40 MG tablet Take 1 tablet (40 mg total) by mouth daily. 06/10/17   Hosie Poisson, MD  senna-docusate (SENOKOT-S) 8.6-50 MG tablet Take 2 tablets by mouth 2 (two) times daily. 06/11/17   Hosie Poisson, MD  traZODone (DESYREL) 150 MG tablet Take 300 mg by mouth at bedtime.     [provider]    Family History Family History  Problem Relation Age of Onset  . Diabetes Mother     Social History Social History  Substance Use Topics  . Smoking status: Current Every Day Smoker    Packs/day: 0.50    Years: 9.00    Types: Cigarettes  . Smokeless tobacco: Never Used  . Alcohol use No   Social history unknown, unobtainable  Allergies   Sulfa antibiotics   Review of Systems Review of Systems   Physical Exam Updated Vital Signs There were no vitals taken for this visit.  Physical Exam  Constitutional:  Ill appearing Glasgow Coma Score 7, moans to noxious stimulus. Nonpurposeful movement to noxious stimulus.  HENT:  Head: Normocephalic and atraumatic.  Mucous membranes dry  Eyes: Pupils are equal, round, and reactive to light. Conjunctivae are normal.  Neck: Neck supple. No tracheal deviation present. No thyromegaly present.  Cardiovascular: Normal rate and regular rhythm.   No murmur  heard. Pulmonary/Chest: Effort normal and breath sounds normal.  Abdominal: Soft. He exhibits no distension. There is no tenderness.  Musculoskeletal: Normal range of motion. He exhibits no edema or tenderness.  Neurological:  Responsive Glasgow Coma Score 7  Skin: Skin is warm and dry. No rash noted. No erythema.  Psychiatric:  Unobtainable, patient unresponsive  Nursing note and vitals reviewed.    ED Treatments / Results  Labs (all labs ordered are listed, but only abnormal results are displayed) Labs Reviewed  CBC WITH DIFFERENTIAL/PLATELET  URINALYSIS, ROUTINE W REFLEX MICROSCOPIC  RAPID URINE DRUG SCREEN, HOSP PERFORMED  BLOOD GAS, VENOUS  CBC  CK  I-STAT CHEM 8, ED    EKG  EKG Interpretation  Date/Time:  Sunday July 01 2017 11:09:17 EDT  Ventricular Rate:  77 PR Interval:    QRS Duration: 125 QT Interval:  437 QTC Calculation: 495 R Axis:   -53 Text Interpretation:  Sinus rhythm Nonspecific IVCD with LAD ST elevation suggests acute pericarditis Baseline wander in lead(s) V6 peaked T waves consistent with hyper kalemia New since previous tracing Confirmed by Orlie Dakin [phone]) on 07/01/2017 11:20:48 AM Also confirmed by Orlie Dakin [phone]), editor Laurena Spies [phone])  on 07/01/2017 11:23:26 AM       Radiology No results found.  Procedures .Central Line Date/Time: 07/01/2017 11:12 AM Performed by: Orlie Dakin Authorized by: Orlie Dakin   Consent:    Consent obtained:  Emergent situation Pre-procedure details:    Hand hygiene: Hand hygiene performed prior to insertion     Sterile barrier technique: All elements of maximal sterile technique followed     Skin preparation:  2% chlorhexidine   Skin preparation agent: Skin preparation agent completely dried prior to procedure   Anesthesia (see MAR for exact dosages):    Anesthesia method:  None Procedure details:    Location:  L femoral   Site selection rationale:  Emergency situation,  patient unable to cooperate   Catheter size:  8.5 Fr   Landmarks identified: yes     Number of attempts:  1   Successful placement: yes   Post-procedure details:    Post-procedure:  Dressing applied   Assessment:  Blood return through all ports and free fluid flow   Patient tolerance of procedure:  Tolerated well, no immediate complications   (including critical care time)  Medications Ordered in ED Medications  sodium chloride 0.9 % bolus 2,000 mL (not administered)    Chest x-ray viewed by me Results for orders placed or performed during the hospital encounter of 07/01/17  CBC with Differential/Platelet  Result Value Ref Range   WBC 55.9 (HH) 4.0 - 10.5 K/uL   RBC 4.39 4.22 - 5.81 MIL/uL   Hemoglobin 12.7 (L) 13.0 - 17.0 g/dL   HCT 40.7 39.0 - 52.0 %   MCV 92.7 78.0 - 100.0 fL   MCH 28.9 26.0 - 34.0 pg   MCHC 31.2 30.0 - 36.0 g/dL   RDW 13.3 11.5 - 15.5 %   Platelets 525 (H) 150 - 400 K/uL   Neutrophils Relative % 78 %   Lymphocytes Relative 11 %   Monocytes Relative 11 %   Eosinophils Relative 0 %   Basophils Relative 0 %   Neutro Abs 43.5 (H) 1.7 - 7.7 K/uL   Lymphs Abs 6.2 (H) 0.7 - 4.0 K/uL   Monocytes Absolute 6.2 (H) 0.1 - 1.0 K/uL   Eosinophils Absolute 0.0 0.0 - 0.7 K/uL   Basophils Absolute 0.0 0.0 - 0.1 K/uL   WBC Morphology MILD LEFT SHIFT (1-5% METAS, OCC MYELO, OCC BANDS)   CK  Result Value Ref Range   Total CK 209 49 - 397 U/L  Comprehensive metabolic panel  Result Value Ref Range   Sodium 123 (L) 135 - 145 mmol/L   Potassium >7.5 (HH) 3.5 - 5.1 mmol/L   Chloride 87 (L) 101 - 111 mmol/L   CO2 <7 (L) 22 - 32 mmol/L   Glucose, Bld 1,065 (HH) 65 - 99 mg/dL   BUN 56 (H) 6 - 20 mg/dL   Creatinine, Ser 4.39 (H) 0.61 - 1.24 mg/dL   Calcium 8.3 (L) 8.9 - 10.3 mg/dL   Total Protein 6.0 (L) 6.5 - 8.1 g/dL   Albumin 3.6 3.5 - 5.0 g/dL  AST 14 (L) 15 - 41 U/L   ALT 19 17 - 63 U/L   Alkaline Phosphatase 158 (H) 38 - 126 U/L   Total Bilirubin 1.9 (H) 0.3  - 1.2 mg/dL   GFR calc non Af Amer 16 (L) >60 mL/min   GFR calc Af Amer 19 (L) >60 mL/min  Acetaminophen level  Result Value Ref Range   Acetaminophen (Tylenol), Serum <10 (L) 10 - 30 ug/mL  Salicylate level  Result Value Ref Range   Salicylate Lvl <0.1 2.8 - 30.0 mg/dL  I-stat chem 8, ed  Result Value Ref Range   Sodium 120 (L) 135 - 145 mmol/L   Potassium 7.9 (HH) 3.5 - 5.1 mmol/L   Chloride 96 (L) 101 - 111 mmol/L   BUN 56 (H) 6 - 20 mg/dL   Creatinine, Ser 3.30 (H) 0.61 - 1.24 mg/dL   Glucose, Bld >700 (HH) 65 - 99 mg/dL   Calcium, Ion 1.17 1.15 - 1.40 mmol/L   TCO2 5 0 - 100 mmol/L   Hemoglobin 13.9 13.0 - 17.0 g/dL   HCT 41.0 39.0 - 52.0 %   Comment NOTIFIED PHYSICIAN   I-stat troponin, ED  Result Value Ref Range   Troponin i, poc 0.01 0.00 - 0.08 ng/mL   Comment 3          I-Stat CG4 Lactic Acid, ED  Result Value Ref Range   Lactic Acid, Venous 2.13 (HH) 0.5 - 1.9 mmol/L   Comment NOTIFIED PHYSICIAN   CBG monitoring, ED  Result Value Ref Range   Glucose-Capillary >600 (HH) 65 - 99 mg/dL   Dg Chest 2 View  Result Date: 06/06/2017 CLINICAL DATA:  31 year old male with chest pain. EXAM: CHEST  2 VIEW COMPARISON:  None. FINDINGS: The heart size and mediastinal contours are within normal limits. Both lungs are clear. The visualized skeletal structures are unremarkable. IMPRESSION: No active cardiopulmonary disease. Electronically Signed   By: Anner Crete M.D.   On: 06/06/2017 19:00   Ct Angio Chest Pe W Or Wo Contrast  Result Date: 06/10/2017 CLINICAL DATA:  Sternal chest pain for 3 or 4 days. EXAM: CT ANGIOGRAPHY CHEST WITH CONTRAST TECHNIQUE: Multidetector CT imaging of the chest was performed using the standard protocol during bolus administration of intravenous contrast. Multiplanar CT image reconstructions and MIPs were obtained to evaluate the vascular anatomy. CONTRAST:  100 mL of Isovue 370 COMPARISON:  Chest x-ray June 06, 2017 FINDINGS: Cardiovascular: No  coronary artery calcifications are identified. The heart is normal. The thoracic aorta is non aneurysmal with no atherosclerosis or dissection. No pulmonary emboli are identified. Mediastinum/Nodes: Mild gynecomastia. The thyroid is normal. The esophagus is unremarkable. No adenopathy. Lungs/Pleura: Central airways are normal. No pneumothorax. Small bilateral pleural effusions with associated atelectasis are identified. There is a 5.7 mm nodule in the right lung on series 7, image 68. No other nodules. No masses. No suspicious infiltrates. Upper Abdomen: Limited views the upper abdomen are unremarkable. Musculoskeletal: No chest wall abnormality. No acute or significant osseous findings. Review of the MIP images confirms the above findings. IMPRESSION: 1. Small bilateral pleural effusions with compressive atelectasis. 2. No pulmonary emboli are identified. 3. No cause for the patient's symptoms is identified. 4. 5.7 mm nodule in the right lung. No follow-up needed if patient is low-risk. Non-contrast chest CT can be considered in 12 months if patient is high-risk. This recommendation follows the consensus statement: Guidelines for Management of Incidental Pulmonary Nodules Detected on CT Images: From the Fleischner  Society 2017; Radiology 2017; [phone]. Electronically Signed   By: Dorise Bullion III M.D   On: 06/10/2017 15:46   Dg Chest Port 1 View  Result Date: 07/01/2017 CLINICAL DATA:  Altered mental status.  Polysubstance abuse. EXAM: PORTABLE CHEST 1 VIEW COMPARISON:  06/06/2017 and chest CTA dated 06/10/2017. FINDINGS: The heart size and mediastinal contours are within normal limits. Both lungs are clear. The visualized skeletal structures are unremarkable. IMPRESSION: Normal examination. Electronically Signed   By: Claudie Revering M.D.   On: 07/01/2017 10:46   Dg Chest Port 1 View  Result Date: 06/04/2017 CLINICAL DATA:  DKA EXAM: PORTABLE CHEST 1 VIEW COMPARISON:  06/03/2017 FINDINGS: The heart  size and mediastinal contours are within normal limits. Both lungs are clear. The visualized skeletal structures are unremarkable. IMPRESSION: No active disease. Electronically Signed   By: Inez Catalina M.D.   On: 06/04/2017 11:13   Dg Chest Port 1 View  Result Date: 06/03/2017 CLINICAL DATA:  Acute onset of diabetic ketoacidosis. Initial encounter. EXAM: PORTABLE CHEST 1 VIEW COMPARISON:  Chest radiograph performed 11/05/2015 FINDINGS: The lungs are well-aerated. Minimal bibasilar atelectasis is noted. There is no evidence of pleural effusion or pneumothorax. The cardiomediastinal silhouette is within normal limits. No acute osseous abnormalities are seen. IMPRESSION: Minimal bibasilar atelectasis noted.  Lungs otherwise clear. Electronically Signed   By: Garald Balding M.D.   On: 06/03/2017 04:24  Chest x-ray viewed by me Initial Impression / Assessment and Plan / ED Course  I have reviewed the triage vital signs and the nursing notes.  Pertinent labs & imaging results that were available during my care of the patient were reviewed by me and considered in my medical decision making (see chart for details).     Nursing unable to establish peripheral IV access Angiocath insertion Performed by: Orlie Dakin  Consent implied patient unresponsive  Time out: Immediately prior to procedure a "time out" was called to verify the correct patient, procedure, equipment, support staff and site/side marked as required.  Preparation: Patient was prepped and draped in the usual sterile fashion.  Vein Location: Right external jugular    Gauge: 20  Normal blood return and flush without difficulty Patient tolerance: Patient tolerated the procedure well with no immediate complications.   Code sepsis called based on hypothermia, warming blanket applied.  12 noon patient is somewhat more alert and awake. He opens eyes to verbal stimulus. He moves all extremities. He speaks his name when asked albeit  speech is slurred. Dr.Crim from intensive care unit here and took over care of patient. Intravenous insulin drip 5 glucose stabilizer ordered by me per DKA order set Patient will be admitted to intensive care unit Final Clinical Impressions(s) / ED Diagnoses  Diagnosis #1 diabetic ketoacidosis #2  septic shock #3 hypothermia #4 acute renal failure #5 hyperkalemia #6 leukocytosis #7 hypothermia CRITICAL CARE Performed by: Orlie Dakin Total critical care time: 75 minutes Critical care time was exclusive of separately billable procedures and treating other patients. Critical care was necessary to treat or prevent imminent or life-threatening deterioration. Critical care was time spent personally by me on the following activities: development of treatment plan with patient and/or surrogate as well as nursing, discussions with consultants, evaluation of patient's response to treatment, examination of patient, obtaining history from patient or surrogate, ordering and performing treatments and interventions, ordering and review of laboratory studies, ordering and review of radiographic studies, pulse oximetry and re-evaluation of patient's condition. Final diagnoses:  None    New  Prescriptions New Prescriptions   No medications on file     Orlie Dakin, MD 07/01/17 [place], MD 07/01/17 1238 3 PM patient arouses to tactile stimulus. Moves all extremities, speech is slurred. He will be transferred to [facility] emergency Department. Dr.Crim is accepting physician   Orlie Dakin, MD 07/01/17 [phone]

## 2017-07-01 NOTE — Progress Notes (Signed)
ABG critical values given to Dr. Roselyn Bering.

## 2017-07-01 NOTE — Progress Notes (Signed)
RT NOTE  CRITICAL VALUE ALERT  Critical Value: ABG PH: 7.25 CO2: Below the Reportable Range < 19.0 O2: 56.5  Results reported to [place] Ouch, RN @ [phone] by Henry Russel, RRT, RCP

## 2017-07-01 NOTE — ED Notes (Signed)
Patient has been approved for [facility] ICU/SD bed 1231. Receiving nurse will be Joellen Jersey @ [phone].

## 2017-07-01 NOTE — ED Triage Notes (Signed)
Pt arrives from home via [facility] reporting pt found altered today, CBG reading "high." EMS reports roommate reorted pt c/o thrist and frequent urination yesterday but did not seek tx.  EMS reports t 86% RA, placed on NRB, satting at 100% on arrival.  On arrival GCS 5.

## 2017-07-01 NOTE — Progress Notes (Signed)
CRITICAL VALUE ALERT  Critical Value:  Venous Blood Gas PH 6.99 CO2 Below Reportable Range  Date & Time Notied:  07/01/2017 1830  Provider Notified: Henry Russel MD  Orders Received/Actions taken: Continue with current plan, No new orders at this time. Continue to monitor.

## 2017-07-01 NOTE — H&P (Signed)
PULMONARY / CRITICAL CARE MEDICINE   Name: COUPER JUNCAJ MRN: [id number] DOB: 1986/04/26    ADMISSION DATE:  07/01/2017 CONSULTATION DATE:  07/01/2017  REFERRING MD:  Orlie Dakin  CHIEF COMPLAINT:  DKA with altered mental status  HISTORY OF PRESENT ILLNESS:   Mr. [place] is a 31 y.o. male with medical history significant of type 1 diabetes, insulin noncompliance, DKA, polysubstance abuse including tobacco, cocaine and marijuana, amphetamine, schizophrenia, GERD, depression, anxiety. He was brought to the ED after being found unresponsive by his roommate. The latter reported that 1 d pta he was complaining of increased thirst and polyuria but did not seed medical attention. He had 3 admissions last month for DKA; signing out [place] after the first and returned 3 days later. Initial labs after arrival revealed a K= of 7.9, pH 6.6 and glucose >700. No other history is available.  PAST MEDICAL HISTORY :  He  has a past medical history of Anemia; Anxiety; Arthritis; Chicken pox; Childhood asthma; Chronic pain; Depression; DKA (diabetic ketoacidoses) ([place]) (11/18/2014); GERD (gastroesophageal reflux disease); Hypertension; Migraine; Noncompliance with medication regimen; Pneumonia; Polysubstance abuse; Schizo affective schizophrenia ([place]); Scoliosis; and Type I diabetes mellitus ([place]).  PAST SURGICAL HISTORY: He  has a past surgical history that includes Incision and drainage abscess (Left, 11/2011) and Cardiac catheterization (11/2016).  Allergies  Allergen Reactions  . Sulfa Antibiotics Other (See Comments)    Unknown childhood allergy    No current facility-administered medications on file prior to encounter.    Current Outpatient Prescriptions on File Prior to Encounter  Medication Sig  . blood glucose meter kit and supplies KIT Dispense based on patient and insurance preference. Use up to four times daily as directed. (FOR ICD-9 250.00, 250.01).  . blood glucose meter kit and  supplies KIT Dispense based on patient and insurance preference. Use up to four times daily as directed. (FOR ICD-9 250.00, 250.01).  . feeding supplement, GLUCERNA SHAKE, (GLUCERNA SHAKE) LIQD Take 237 mLs by mouth 3 (three) times daily between meals.  [name] glucose blood test strip Use as instructed  . insulin aspart (NOVOLOG FLEXPEN) 100 UNIT/ML FlexPen CBG 70 - 120: 0 units CBG 121 - 150: 2 units CBG 151 - 200: 3 units CBG 201 - 250: 5 units CBG 251 - 300: 8 units CBG 301 - 350: 11 units CBG 351 - 400: 15 units (Patient taking differently: Inject 10 Units into the skin 3 (three) times daily with meals. CBG 70 - 120: 0 units CBG 121 - 150: 2 units CBG 151 - 200: 3 units CBG 201 - 250: 5 units CBG 251 - 300: 8 units CBG 301 - 350: 11 units CBG 351 - 400: 15 units)  . Insulin Detemir (LEVEMIR FLEXPEN) 100 UNIT/ML Pen Inject 20 Units into the skin daily at 10 pm.  . Insulin Pen Needle (PEN NEEDLES) 31G X 5 MM MISC 90 each by Does not apply route 4 (four) times daily -  before meals and at bedtime.  . Lancets (ACCU-CHEK MULTICLIX) lancets Use as directed  . metoCLOPramide (REGLAN) 10 MG tablet Take 1 tablet (10 mg total) by mouth 3 (three) times daily with meals.  . ondansetron (ZOFRAN ODT) 4 MG disintegrating tablet Take 1 tablet (4 mg total) by mouth every 8 (eight) hours as needed for nausea or vomiting.  . ondansetron (ZOFRAN) 4 MG tablet Take 1 tablet (4 mg total) by mouth every 8 (eight) hours as needed for nausea or vomiting.  [name] oxyCODONE (OXY IR/ROXICODONE)  5 MG immediate release tablet Take 1 tablet (5 mg total) by mouth every 6 (six) hours as needed for moderate pain.  . pantoprazole (PROTONIX) 40 MG tablet Take 1 tablet (40 mg total) by mouth daily.  [name] senna-docusate (SENOKOT-S) 8.6-50 MG tablet Take 2 tablets by mouth 2 (two) times daily.  . traZODone (DESYREL) 150 MG tablet Take 300 mg by mouth at bedtime.   . [DISCONTINUED] citalopram (CELEXA) 20 MG tablet Take 20 mg by mouth  daily.  . [DISCONTINUED] FLUoxetine (PROZAC) 20 MG tablet Take 20 mg by mouth every morning.  . [DISCONTINUED] insulin NPH-regular Human (NOVOLIN 70/30) (70-30) 100 UNIT/ML injection Inject 40 Units into the skin 2 (two) times daily with a meal.  . [DISCONTINUED] OLANZapine zydis (ZYPREXA) 10 MG disintegrating tablet Take 10 mg by mouth 2 (two) times daily.  . [DISCONTINUED] sertraline (ZOLOFT) 50 MG tablet Take 50 mg by mouth daily. For depression. Just started med, have not picked up yet [facility] [address]    FAMILY HISTORY:  His indicated that his mother is alive.    SOCIAL HISTORY: He  reports that he has been smoking Cigarettes.  He has a 4.50 pack-year smoking history. He has never used smokeless tobacco. He reports that he uses drugs, including Marijuana, Cocaine, Methamphetamines, and "Crack" cocaine. He reports that he does not drink alcohol.  REVIEW OF SYSTEMS:   The patient does not respond to verbal commands, is moaning and unable to provide a history. At his admission on 06/15/2017 the following ROS was obtained:  General: no fevers, chills, no changes in body weight, has poor appetite, has fatigue HEENT: no blurry vision, hearing changes or sore throat Respiratory: no dyspnea, coughing, wheezing CV: no chest pain, no palpitations GI: has nausea, vomiting, abdominal pain, no diarrhea, constipation GU: no dysuria, burning on urination, increased urinary frequency, hematuria  Ext: no leg edema Neuro: no unilateral weakness, numbness, or tingling, no vision change or hearing loss Skin: no rash, no skin tear. MSK: No muscle spasm, no deformity, no limitation of range of movement in spin Heme: No easy bruising.  Travel history: No recent long distant travel.  SUBJECTIVE:  Moaning an d unresponsive to verbal stimuli  VITAL SIGNS: When I first saw the patient his systolic BP was 77 mmHg  BP 115/69   Pulse 96   Resp (!) 22   Ht '6\' 1"'$  (1.854 m)   Wt 88.5 kg (195 lb)    SpO2 96%   BMI 25.73 kg/m   HEMODYNAMICS: VS as above after 2L NS    VENTILATOR SETTINGS: Not intubated    INTAKE / OUTPUT: No intake/output data recorded.  PHYSICAL EXAMINATION: General:  WD/WN WF in obvious distress Neuro:  Pupils are sluggishly reactive; negative corneal reflexes; moves all four extremities spontaneously with good strength  HEENT:  [place]/AT, clear conjuctiva; nl external auditory canal; teeth clenched so unable to examine orophayrnx Cardiovascular:  Nl S1 and physiologic S2; No S3, S4 or murmurs; pulses 2+ an symmetric Lungs:  Clear to auscultation Abdomen:  No guarding or organomegaly appreciated. No masses Musculoskeletal:  Good muscle tone; no joint deformities Skin:  Papular lesions on penis; appearance of previous vein injection sites  LABS:  BMET  Recent Labs Lab 07/01/17 1019 07/01/17 1030  NA 123* 120*  K >7.5* 7.9*  CL 87* 96*  CO2 <7*  --   BUN 56* 56*  CREATININE 4.39* 3.30*  GLUCOSE 1,065* >700*    Electrolytes  Recent Labs Lab  07/01/17 1019  CALCIUM 8.3*    CBC  Recent Labs Lab 07/01/17 1019 07/01/17 1030  WBC PENDING  --   HGB 12.7* 13.9  HCT 40.7 41.0  PLT PENDING  --     Coag's No results for input(s): APTT, INR in the last 168 hours.  Sepsis Markers  Recent Labs Lab 07/01/17 1035  LATICACIDVEN 2.13*    ABG No results for input(s): PHART, PCO2ART, PO2ART in the last 168 hours.  Liver Enzymes  Recent Labs Lab 07/01/17 1019  AST 14*  ALT 19  ALKPHOS 158*  BILITOT 1.9*  ALBUMIN 3.6    Cardiac Enzymes No results for input(s): TROPONINI, PROBNP in the last 168 hours.  Glucose  Recent Labs Lab 07/01/17 1104  GLUCAP >600*    Imaging Dg Chest Port 1 View  Result Date: 07/01/2017 CLINICAL DATA:  Altered mental status.  Polysubstance abuse. EXAM: PORTABLE CHEST 1 VIEW COMPARISON:  06/06/2017 and chest CTA dated 06/10/2017. FINDINGS: The heart size and mediastinal contours are within normal  limits. Both lungs are clear. The visualized skeletal structures are unremarkable. IMPRESSION: Normal examination. Electronically Signed   By: Claudie Revering M.D.   On: 07/01/2017 10:46     STUDIES:  CXR personally reviewed and I agree it shows NAD. ECG shows hyperacute T waves c/w hyperkalemia. Initial glucose 1065, AG at least 39 and lactate of 2.13  CULTURES: BC sent; urine C&S sent  ANTIBIOTICS: Zosyn + Vanc ordered  SIGNIFICANT EVENTS: As above  LINES/TUBES: Left femoral venous catheter  DISCUSSION: The patient is in obvious DKA. With pH of 6.6, labile BP and hyperK+, patient was given NaHCO3 1 amp  ASSESSMENT / PLAN:  PULMONARY A: Initial CXR with NAD;  No ABGs had been ordered P:   ABGs Stat  CARDIOVASCULAR A:  Hemodynamically labile. Has received 3L NS.  P:  Will give an additional 2L and reassess  RENAL A:   Azotemia increased from BUN/Cr of 7/0.61 on 06/19/2017 indicative of AKI, but dehydrated as well P:   Continue fluid resuscitation  GASTROINTESTINAL A:   LFTs OK. No N&V since arrival P:   Observe  HEMATOLOGIC A:   WBC 55.9 K (likely leukemoid rxn) with thrombocytosis (probably an acute phase response) but still need to be concerned on underlying precipitating infecftion P:  Follow serially  INFECTIOUS A:   Need to be concerned of an infection as a precipitating event P:   Continue empiric ABxs; will D/c if cultures and clinical course no longer support infection.  ENDOCRINE A:   DKA   P:   DKA protocol  NEUROLOGIC A:   Altered MS likely secondary to DKA P:   Will consider head CT if no improvement with resolution of DKA  FAMILY  - Updates: No family available     [facility] and [facility] Pager: [phone]  07/01/2017, 12:15 PM

## 2017-07-01 NOTE — Progress Notes (Signed)
Pharmacy Antibiotic Note  [name] is a 31 y.o. male admitted on 07/01/2017 with AMS, hyperglycemia, and AKI.  Pharmacy has been consulted for vancomycin and Zosyn dosing for sepsis.  Afebrile, WBC WNL, LA 2.13.   Plan: - Vanc 1750mg  IV x 1, then 1250mg  IV Q24H - Zosyn 3.375gm IV Q8H, 4 hr infusion - Monitor renal fxn, clinical progress, vanc trough as indicated, abx LOT - BMET in AM   Height: 6\' 1"  (185.4 cm) IBW/kg (Calculated) : 79.9  No data recorded.   Recent Labs Lab 07/01/17 1019 07/01/17 1030 07/01/17 1035  WBC PENDING  --   --   CREATININE  --  3.30*  --   LATICACIDVEN  --   --  2.13*    Estimated Creatinine Clearance: 36.3 mL/min (A) (by C-G formula based on SCr of 3.3 mg/dL (H)).    Allergies  Allergen Reactions  . Sulfa Antibiotics Other (See Comments)    Unknown childhood allergy    Vanc 7/15 >> Zosyn 7/15 >>  7/15 BCx -    Essynce Munsch D. 8/15, [name], BCPS Pager:  [phone] 07/01/2017, 11:27 AM

## 2017-07-01 NOTE — Progress Notes (Signed)
eLink Physician-Brief Progress Note Patient Name: [name] DOB: 1986-04-14 MRN: [id number]   Date of Service  07/01/2017  HPI/Events of Note  Hypotension.  DKA and polysubstance abuse with severe acidosis.  Persistent low bp despite aggressive volume resuscitation.  No obvious evidence of infection.  eICU Interventions  neosynephrine drip titrate to goal MAP 65     Intervention Category Major Interventions: Hypotension - evaluation and management  Henry Russel, P 07/01/2017, 5:53 PM

## 2017-07-01 NOTE — ED Notes (Signed)
IV attempt x 3 unsuccessful by 2 different RN'S. EJ has infiltrated and has ben removed. Dr. Ethelda Chick back at bedside for central line placement

## 2017-07-01 NOTE — ED Notes (Signed)
RT called for ABG.

## 2017-07-01 NOTE — Progress Notes (Signed)
CBG 556. Insulin gtt adjusted to 9.9 per glucose stabilizer

## 2017-07-02 DIAGNOSIS — E1010 Type 1 diabetes mellitus with ketoacidosis without coma: Principal | ICD-10-CM

## 2017-07-02 LAB — CBC
HEMATOCRIT: 29.8 % — AB (ref 39.0–52.0)
Hemoglobin: 10.8 g/dL — ABNORMAL LOW (ref 13.0–17.0)
MCH: 29 pg (ref 26.0–34.0)
MCHC: 36.2 g/dL — ABNORMAL HIGH (ref 30.0–36.0)
MCV: 79.9 fL (ref 78.0–100.0)
PLATELETS: 273 10*3/uL (ref 150–400)
RBC: 3.73 MIL/uL — ABNORMAL LOW (ref 4.22–5.81)
RDW: 13.6 % (ref 11.5–15.5)
WBC: 18.7 10*3/uL — AB (ref 4.0–10.5)

## 2017-07-02 LAB — BLOOD GAS, ARTERIAL
Acid-base deficit: 30.2 mmol/L — ABNORMAL HIGH (ref 0.0–2.0)
Acid-base deficit: 4.3 mmol/L — ABNORMAL HIGH (ref 0.0–2.0)
BICARBONATE: 18.4 mmol/L — AB (ref 20.0–28.0)
Bicarbonate: 1.4 mmol/L — ABNORMAL LOW (ref 20.0–28.0)
Drawn by: 36496
Drawn by: 441261
FIO2: 21
FIO2: 21
FIO2: 21
O2 SAT: 96.6 %
O2 Saturation: 91.2 %
O2 Saturation: 98.7 %
PATIENT TEMPERATURE: 90.3
PATIENT TEMPERATURE: 98.6
PCO2 ART: 27 mmHg — AB (ref 32.0–48.0)
PH ART: 7.258 — AB (ref 7.350–7.450)
PO2 ART: 149 mmHg — AB (ref 83.0–108.0)
PO2 ART: 56.5 mmHg — AB (ref 83.0–108.0)
PO2 ART: 99.4 mmHg (ref 83.0–108.0)
Patient temperature: 98.1
pH, Arterial: 7.447 (ref 7.350–7.450)

## 2017-07-02 LAB — GLUCOSE, CAPILLARY
GLUCOSE-CAPILLARY: 556 mg/dL — AB (ref 65–99)
GLUCOSE-CAPILLARY: 156 mg/dL — AB (ref 65–99)
GLUCOSE-CAPILLARY: 141 mg/dL — AB (ref 65–99)
GLUCOSE-CAPILLARY: 120 mg/dL — AB (ref 65–99)
GLUCOSE-CAPILLARY: 146 mg/dL — AB (ref 65–99)
GLUCOSE-CAPILLARY: 206 mg/dL — AB (ref 65–99)
GLUCOSE-CAPILLARY: 172 mg/dL — AB (ref 65–99)
GLUCOSE-CAPILLARY: 147 mg/dL — AB (ref 65–99)
GLUCOSE-CAPILLARY: 129 mg/dL — AB (ref 65–99)
Glucose-Capillary: 110 mg/dL — ABNORMAL HIGH (ref 65–99)
Glucose-Capillary: 144 mg/dL — ABNORMAL HIGH (ref 65–99)
Glucose-Capillary: 155 mg/dL — ABNORMAL HIGH (ref 65–99)
Glucose-Capillary: 155 mg/dL — ABNORMAL HIGH (ref 65–99)
Glucose-Capillary: 161 mg/dL — ABNORMAL HIGH (ref 65–99)
Glucose-Capillary: 167 mg/dL — ABNORMAL HIGH (ref 65–99)
Glucose-Capillary: 171 mg/dL — ABNORMAL HIGH (ref 65–99)
Glucose-Capillary: 185 mg/dL — ABNORMAL HIGH (ref 65–99)
Glucose-Capillary: 196 mg/dL — ABNORMAL HIGH (ref 65–99)
Glucose-Capillary: 201 mg/dL — ABNORMAL HIGH (ref 65–99)

## 2017-07-02 LAB — I-STAT VENOUS BLOOD GAS, ED
Acid-base deficit: <30 mmol/L — ABNORMAL HIGH (ref 0.0–2.0)
BICARBONATE: 1.9 mmol/L — AB (ref 20.0–24.0)
O2 Saturation: 79 % — ABNORMAL LOW (ref 90.0–100.0)
pCO2, Ven: 16.7 mmHg — CL (ref 44.0–60.0)
pH, Ven: 6.663 — CL (ref 7.250–7.430)
pO2, Ven: 91 mmHg — ABNORMAL HIGH (ref 32.0–45.0)

## 2017-07-02 LAB — BASIC METABOLIC PANEL
ANION GAP: 14 (ref 5–15)
ANION GAP: 9 (ref 5–15)
ANION GAP: 8 (ref 5–15)
Anion gap: 18 — ABNORMAL HIGH (ref 5–15)
BUN: 44 mg/dL — AB (ref 6–20)
BUN: 39 mg/dL — ABNORMAL HIGH (ref 6–20)
BUN: 35 mg/dL — ABNORMAL HIGH (ref 6–20)
BUN: 31 mg/dL — ABNORMAL HIGH (ref 6–20)
CHLORIDE: 116 mmol/L — AB (ref 101–111)
CHLORIDE: 115 mmol/L — AB (ref 101–111)
CHLORIDE: 117 mmol/L — AB (ref 101–111)
CHLORIDE: 117 mmol/L — AB (ref 101–111)
CO2: 12 mmol/L — AB (ref 22–32)
CO2: 15 mmol/L — AB (ref 22–32)
CO2: 18 mmol/L — ABNORMAL LOW (ref 22–32)
CO2: 21 mmol/L — ABNORMAL LOW (ref 22–32)
CREATININE: 2.19 mg/dL — AB (ref 0.61–1.24)
Calcium: 7.6 mg/dL — ABNORMAL LOW (ref 8.9–10.3)
Calcium: 7.3 mg/dL — ABNORMAL LOW (ref 8.9–10.3)
Calcium: 7.2 mg/dL — ABNORMAL LOW (ref 8.9–10.3)
Calcium: 7.5 mg/dL — ABNORMAL LOW (ref 8.9–10.3)
Creatinine, Ser: 1.91 mg/dL — ABNORMAL HIGH (ref 0.61–1.24)
Creatinine, Ser: 1.71 mg/dL — ABNORMAL HIGH (ref 0.61–1.24)
Creatinine, Ser: 1.47 mg/dL — ABNORMAL HIGH (ref 0.61–1.24)
GFR calc Af Amer: 44 mL/min — ABNORMAL LOW (ref >60)
GFR calc Af Amer: 52 mL/min — ABNORMAL LOW (ref >60)
GFR calc Af Amer: 59 mL/min — ABNORMAL LOW (ref >60)
GFR calc Af Amer: >60 mL/min (ref >60)
GFR calc non Af Amer: 38 mL/min — ABNORMAL LOW (ref >60)
GFR, EST NON AFRICAN AMERICAN: 45 mL/min — AB (ref >60)
GFR, EST NON AFRICAN AMERICAN: 51 mL/min — AB (ref >60)
GLUCOSE: 222 mg/dL — AB (ref 65–99)
GLUCOSE: 197 mg/dL — AB (ref 65–99)
GLUCOSE: 169 mg/dL — AB (ref 65–99)
Glucose, Bld: 138 mg/dL — ABNORMAL HIGH (ref 65–99)
POTASSIUM: 3 mmol/L — AB (ref 3.5–5.1)
POTASSIUM: 3.4 mmol/L — AB (ref 3.5–5.1)
POTASSIUM: 3.3 mmol/L — AB (ref 3.5–5.1)
POTASSIUM: 3.2 mmol/L — AB (ref 3.5–5.1)
SODIUM: 146 mmol/L — AB (ref 135–145)
Sodium: 144 mmol/L (ref 135–145)
Sodium: 144 mmol/L (ref 135–145)
Sodium: 146 mmol/L — ABNORMAL HIGH (ref 135–145)

## 2017-07-02 LAB — MAGNESIUM
MAGNESIUM: 1.7 mg/dL (ref 1.7–2.4)
MAGNESIUM: 1.6 mg/dL — AB (ref 1.7–2.4)
MAGNESIUM: 1.8 mg/dL (ref 1.7–2.4)

## 2017-07-02 LAB — PHOSPHORUS
PHOSPHORUS: 2.7 mg/dL (ref 2.5–4.6)
Phosphorus: <1 mg/dL — CL (ref 2.5–4.6)
Phosphorus: 2.5 mg/dL (ref 2.5–4.6)

## 2017-07-02 LAB — URINE CULTURE: Culture: NO GROWTH

## 2017-07-02 LAB — CORTISOL: CORTISOL PLASMA: 47.6 ug/dL

## 2017-07-02 LAB — LACTIC ACID, PLASMA

## 2017-07-02 LAB — CBG MONITORING, ED: Glucose-Capillary: >600 mg/dL (ref 65–99)

## 2017-07-02 MED ORDER — INSULIN ASPART 100 UNIT/ML ~~LOC~~ SOLN
0.0000 [IU] | SUBCUTANEOUS | Status: DC
  Administered 2017-07-02: 2 [IU] via SUBCUTANEOUS
  Administered 2017-07-02: 3 [IU] via SUBCUTANEOUS
  Administered 2017-07-03: 11 [IU] via SUBCUTANEOUS
  Administered 2017-07-03: 5 [IU] via SUBCUTANEOUS
  Administered 2017-07-03 (×2): 8 [IU] via SUBCUTANEOUS
  Administered 2017-07-03: 5 [IU] via SUBCUTANEOUS

## 2017-07-02 MED ORDER — ENOXAPARIN SODIUM 40 MG/0.4ML ~~LOC~~ SOLN
40.0000 mg | SUBCUTANEOUS | Status: DC
  Administered 2017-07-02: 40 mg via SUBCUTANEOUS
  Filled 2017-07-02: qty 0.4

## 2017-07-02 MED ORDER — VANCOMYCIN HCL IN DEXTROSE 750-5 MG/150ML-% IV SOLN
750.0000 mg | Freq: Two times a day (BID) | INTRAVENOUS | Status: DC
  Administered 2017-07-02 – 2017-07-03 (×2): 750 mg via INTRAVENOUS
  Filled 2017-07-02 (×3): qty 150

## 2017-07-02 MED ORDER — INSULIN GLARGINE 100 UNIT/ML ~~LOC~~ SOLN
15.0000 [IU] | Freq: Every day | SUBCUTANEOUS | Status: DC
  Administered 2017-07-02: 15 [IU] via SUBCUTANEOUS
  Filled 2017-07-02 (×2): qty 0.15

## 2017-07-02 MED ORDER — SODIUM BICARBONATE 8.4 % IV SOLN
INTRAVENOUS | Status: DC
  Filled 2017-07-02: qty 150

## 2017-07-02 MED ORDER — POTASSIUM CHLORIDE 10 MEQ/100ML IV SOLN: 10.0000 meq | INTRAVENOUS | Status: DC

## 2017-07-02 MED ORDER — DEXTROSE-NACL 5-0.9 % IV SOLN
INTRAVENOUS | Status: DC
  Administered 2017-07-02: 05:00:00 via INTRAVENOUS

## 2017-07-02 MED ORDER — POTASSIUM CHLORIDE 10 MEQ/100ML IV SOLN
10.0000 meq | INTRAVENOUS | Status: AC
  Administered 2017-07-02 (×3): 10 meq via INTRAVENOUS
  Filled 2017-07-02 (×3): qty 100

## 2017-07-02 MED ORDER — KCL IN DEXTROSE-NACL 20-5-0.45 MEQ/L-%-% IV SOLN
INTRAVENOUS | Status: DC
  Administered 2017-07-02: 05:00:00 via INTRAVENOUS
  Filled 2017-07-02: qty 1000

## 2017-07-02 MED ORDER — POTASSIUM PHOSPHATES 15 MMOLE/5ML IV SOLN
30.0000 mmol | Freq: Once | INTRAVENOUS | Status: AC
  Administered 2017-07-02: 30 mmol via INTRAVENOUS
  Filled 2017-07-02: qty 10

## 2017-07-02 MED ORDER — KCL IN DEXTROSE-NACL 20-5-0.45 MEQ/L-%-% IV SOLN
INTRAVENOUS | Status: DC
  Administered 2017-07-02: 12:00:00 via INTRAVENOUS
  Filled 2017-07-02 (×2): qty 1000

## 2017-07-02 NOTE — Care Management Note (Signed)
Case Management Note  Patient Details  Name: [name] MRN: [id number] Date of Birth: [birth year]  Subjective/Objective:     Hyperglycemia, aki, confusion               Action/Plan: Date:  July 02, 2017 Chart reviewed for concurrent status and case management needs. Will continue to follow patient progress. Discharge Planning: following for needs Expected discharge date: [id number] Marcelle Smiling, BSN, [place], [place]   [phone]  Expected Discharge Date:                  Expected Discharge Plan:  Home/Self Care  In-House Referral:     Discharge planning Services  CM Consult  Post Acute Care Choice:    Choice offered to:     DME Arranged:    DME Agency:     HH Arranged:    HH Agency:     Status of Service:  In process, will continue to follow  If discussed at Long Length of Stay Meetings, dates discussed:    Additional Comments:  Golda Acre, RN 07/02/2017, 8:40 AM

## 2017-07-02 NOTE — Progress Notes (Addendum)
Pt refusing lab draws. Lab has tried twice, but Pt continues to refuse. Pt asking RN to remove IVs and foley catheter. Refusing to wear O2 Sat probe.

## 2017-07-02 NOTE — Progress Notes (Signed)
eLink Physician-Brief Progress Note Patient Name: [name] DOB: 1986-08-24 MRN: [id number]   Date of Service  07/02/2017  HPI/Events of Note  Notified by bedside nurse of rapid correction of hyperglycemia. Basic metabolic panel pending. Currently on bicarbonate drip and insulin infusion for diabetic ketoacidosis.   eICU Interventions  1. Await results of basic metabolic panel 2. Continuing bicarbonate infusion 3. Transitioning to D5 normal saline at 150 mL per hour until anion gap corrects/closes 4. Continuing insulin infusion & Accu-Cheks every hour      Intervention Category Major Interventions: Acid-Base disturbance - evaluation and management;Hyperglycemia - active titration of insulin therapy  Lawanda Cousins 07/02/2017, 4:36 AM

## 2017-07-02 NOTE — Progress Notes (Addendum)
PULMONARY / CRITICAL CARE MEDICINE   Name: [name] MRN: [id number] DOB: 1986-08-08    ADMISSION DATE:  07/01/2017 CONSULTATION DATE:  07/01/2017  REFERRING MD:  Doug Sou  CHIEF COMPLAINT:  DKA with altered mental status  HISTORY OF PRESENT ILLNESS:   [name] is a 31 y.o. male with medical history significant of type 1 diabetes, insulin noncompliance, DKA, polysubstance abuse including tobacco, cocaine and marijuana, amphetamine, schizophrenia, GERD, depression, anxiety. He was brought to the ED after being found unresponsive by his roommate. The latter reported that 1 d pta he was complaining of increased thirst and polyuria but did not seed medical attention. He had 3 admissions last month for DKA; signing out [place] after the first and returned 3 days later. Initial labs after arrival revealed a K= of 7.9, pH 6.6 and glucose >700. No other history is available.  PAST MEDICAL HISTORY :  He  has a past medical history of Anemia; Anxiety; Arthritis; Chicken pox; Childhood asthma; Chronic pain; Depression; DKA (diabetic ketoacidoses) ([name]) (11/18/2014); GERD (gastroesophageal reflux disease); Hypertension; Migraine; Noncompliance with medication regimen; Pneumonia; Polysubstance abuse; Schizo affective schizophrenia ([name]); Scoliosis; and Type I diabetes mellitus ([name]).  PAST SURGICAL HISTORY: He  has a past surgical history that includes Incision and drainage abscess (Left, 11/2011) and Cardiac catheterization (11/2016).  Allergies  Allergen Reactions  . Sulfa Antibiotics Other (See Comments)    Unknown childhood allergy    No current facility-administered medications on file prior to encounter.    Current Outpatient Prescriptions on File Prior to Encounter  Medication Sig  . insulin aspart (NOVOLOG FLEXPEN) 100 UNIT/ML FlexPen CBG 70 - 120: 0 units CBG 121 - 150: 2 units CBG 151 - 200: 3 units CBG 201 - 250: 5 units CBG 251 - 300: 8 units CBG 301 - 350: 11  units CBG 351 - 400: 15 units (Patient taking differently: Inject 10 Units into the skin 3 (three) times daily with meals. CBG 70 - 120: 0 units CBG 121 - 150: 2 units CBG 151 - 200: 3 units CBG 201 - 250: 5 units CBG 251 - 300: 8 units CBG 301 - 350: 11 units CBG 351 - 400: 15 units)  . Insulin Detemir (LEVEMIR FLEXPEN) 100 UNIT/ML Pen Inject 20 Units into the skin daily at 10 pm. (Patient taking differently: Inject 45 Units into the skin daily at 10 pm. )  . metoCLOPramide (REGLAN) 10 MG tablet Take 1 tablet (10 mg total) by mouth 3 (three) times daily with meals.  . ondansetron (ZOFRAN ODT) 4 MG disintegrating tablet Take 1 tablet (4 mg total) by mouth every 8 (eight) hours as needed for nausea or vomiting.  . ondansetron (ZOFRAN) 4 MG tablet Take 1 tablet (4 mg total) by mouth every 8 (eight) hours as needed for nausea or vomiting.  [name] oxyCODONE (OXY IR/ROXICODONE) 5 MG immediate release tablet Take 1 tablet (5 mg total) by mouth every 6 (six) hours as needed for moderate pain.  . pantoprazole (PROTONIX) 40 MG tablet Take 1 tablet (40 mg total) by mouth daily.  . traZODone (DESYREL) 150 MG tablet Take 300 mg by mouth at bedtime.   . blood glucose meter kit and supplies KIT Dispense based on patient and insurance preference. Use up to four times daily as directed. (FOR ICD-9 250.00, 250.01). (Patient not taking: Reported on 07/01/2017)  . blood glucose meter kit and supplies KIT Dispense based on patient and insurance preference. Use up to four times daily as  directed. (FOR ICD-9 250.00, 250.01).  . feeding supplement, GLUCERNA SHAKE, (GLUCERNA SHAKE) LIQD Take 237 mLs by mouth 3 (three) times daily between meals.  [name] glucose blood test strip Use as instructed  . Insulin Pen Needle (PEN NEEDLES) 31G X 5 MM MISC 90 each by Does not apply route 4 (four) times daily -  before meals and at bedtime.  . Lancets (ACCU-CHEK MULTICLIX) lancets Use as directed  . senna-docusate (SENOKOT-S) 8.6-50 MG  tablet Take 2 tablets by mouth 2 (two) times daily.  . [DISCONTINUED] citalopram (CELEXA) 20 MG tablet Take 20 mg by mouth daily.  . [DISCONTINUED] FLUoxetine (PROZAC) 20 MG tablet Take 20 mg by mouth every morning.  . [DISCONTINUED] insulin NPH-regular Human (NOVOLIN 70/30) (70-30) 100 UNIT/ML injection Inject 40 Units into the skin 2 (two) times daily with a meal.  . [DISCONTINUED] OLANZapine zydis (ZYPREXA) 10 MG disintegrating tablet Take 10 mg by mouth 2 (two) times daily.  . [DISCONTINUED] sertraline (ZOLOFT) 50 MG tablet Take 50 mg by mouth daily. For depression. Just started med, have not picked up yet [facility] [address]    FAMILY HISTORY:  His indicated that his mother is alive.    SOCIAL HISTORY: He  reports that he has been smoking Cigarettes.  He has a 4.50 pack-year smoking history. He has never used smokeless tobacco. He reports that he uses drugs, including Marijuana, Cocaine, Methamphetamines, and "Crack" cocaine. He reports that he does not drink alcohol.  REVIEW OF SYSTEMS:   The patient does not respond to verbal commands, is moaning and unable to provide a history. At his admission on 06/15/2017 the following ROS was obtained:  General: no fevers, chills, no changes in body weight, has poor appetite, has fatigue HEENT: no blurry vision, hearing changes or sore throat Respiratory: no dyspnea, coughing, wheezing CV: no chest pain, no palpitations GI: has nausea, vomiting, abdominal pain, no diarrhea, constipation GU: no dysuria, burning on urination, increased urinary frequency, hematuria  Ext: no leg edema Neuro: no unilateral weakness, numbness, or tingling, no vision change or hearing loss Skin: no rash, no skin tear. MSK: No muscle spasm, no deformity, no limitation of range of movement in spin Heme: No easy bruising.  Travel history: No recent long distant travel.  SUBJECTIVE:  Somnolent. Opens eyes to command Off pressors  VITAL SIGNS:  BP (!) 103/49    Pulse (!) 126   Temp 100 F (37.8 C) (Core (Comment))   Resp (!) 21   Ht '6\' 4"'$  (1.93 m)   Wt 193 lb 2 oz (87.6 kg)   SpO2 98%   BMI 23.51 kg/m   HEMODYNAMICS: VS as above after 2L NS    VENTILATOR SETTINGS: Not intubated    INTAKE / OUTPUT: I/O last 3 completed shifts: In: 8990.6 [I.V.:2590.6; IV Piggyback:6400] Out: 3000 [Urine:3000]  PHYSICAL EXAMINATION: Gen:      No acute distress HEENT:  EOMI, sclera anicteric Neck:     No masses; no thyromegaly Lungs:    Clear to auscultation bilaterally; normal respiratory effort CV:         Regular rate and rhythm; no murmurs Abd:      + bowel sounds; soft, non-tender; no palpable masses, no distension Ext:    No edema; adequate peripheral perfusion Skin:      Warm and dry; no rash Neuro: Somnolent, no focal deficits   LABS:  BMET  Recent Labs Lab 07/01/17 2100 07/02/17 0310 07/02/17 0749  NA 143 146* 144  K 3.8 3.0* 3.4*  CL 114* 116* 115*  CO2 <7* 12* 15*  BUN 50* 44* 39*  CREATININE 2.70* 2.19* 1.91*  GLUCOSE 331* 138* 222*    Electrolytes  Recent Labs Lab 07/01/17 1807 07/01/17 2100 07/02/17 0310 07/02/17 0749  CALCIUM  --  7.2* 7.6* 7.3*  MG  --   --  1.7  --   PHOS 3.5  --  <1.0*  --     CBC  Recent Labs Lab 07/01/17 1019 07/01/17 1030 07/02/17 0310  WBC 55.9*  --  18.7*  HGB 12.7* 13.9 10.8*  HCT 40.7 41.0 29.8*  PLT 525*  --  273    Coag's  Recent Labs Lab 07/01/17 1807  APTT 27  INR 1.49    Sepsis Markers  Recent Labs Lab 07/01/17 1035 07/01/17 1807 07/01/17 2100  LATICACIDVEN 2.13* 1.6 1.6  PROCALCITON  --  5.22  --     ABG  Recent Labs Lab 07/01/17 1320 07/01/17 2245  PHART BELOW REPORTABLE RANGE  7.258*  PCO2ART BELOW REPORTABLE RANGE CRITICAL RESULT CALLED TO, READ BACK BY AND VERIFIED WITH:  PO2ART 149.00* 56.5*    Liver Enzymes  Recent Labs Lab 07/01/17 1019  AST 14*  ALT 19  ALKPHOS 158*  BILITOT 1.9*  ALBUMIN 3.6    Cardiac  Enzymes  Recent Labs Lab 07/01/17 1807  TROPONINI <0.03    Glucose  Recent Labs Lab 07/02/17 0429 07/02/17 0539 07/02/17 0627 07/02/17 0727 07/02/17 0834 07/02/17 0938  GLUCAP 110* 146* 144* 201* 196* 206*    Imaging Dg Chest Port 1 View  Result Date: 07/01/2017 CLINICAL DATA:  Altered mental status.  Polysubstance abuse. EXAM: PORTABLE CHEST 1 VIEW COMPARISON:  06/06/2017 and chest CTA dated 06/10/2017. FINDINGS: The heart size and mediastinal contours are within normal limits. Both lungs are clear. The visualized skeletal structures are unremarkable. IMPRESSION: Normal examination. Electronically Signed   By: Claudie Revering M.D.   On: 07/01/2017 10:46    STUDIES:    CULTURES: Bcx 7/15 > Ucx 7/15 >  ANTIBIOTICS: Zosyn 7/15 > Vanco 7/15 >  SIGNIFICANT EVENTS: As above  LINES/TUBES: Lt femoral line 7/15 > removed PIV X 3  DISCUSSION: 31 Y/O with recurrent DKA secondary to substance abuse, non compliance.   ASSESSMENT / PLAN:  PULMONARY A: Acute resp failure P:   Follow repeat ABG  CARDIOVASCULAR A:  Hemodynamically labile. Has received 3L NS.  P:  Continue fluids Follow LA  RENAL A:   Azotemia increased from BUN/Cr of 7/0.61 on 06/19/2017 indicative of AKI, but dehydrated as well Gap acidosis is resolving, Has peristent non gap acidosis P:   Continue fluid resuscitation with bicarb in dextrose drip  GASTROINTESTINAL A:   Stable P:   Keep NPO  HEMATOLOGIC A:   WBC 55.9 K (likely leukemoid rxn) with thrombocytosis (probably an acute phase response)  Concern for underlying sepsis P:  Follow CBC  INFECTIOUS A:   No focal evidence of infection but Pct is high P:   Continue vanco, zosyn Follow cultures, Pct  ENDOCRINE A:   DKA   P:   Continue insulin drip and dextrose Not ready to transition to SQ insulin yet  NEUROLOGIC A:   Altered MS likely secondary to DKA P:   CT head if there is no improvement in mental status  FAMILY   - Updates: No family available  The patient is critically ill with multiple organ system failure and requires high complexity decision making for assessment  and support, frequent evaluation and titration of therapies, advanced monitoring, review of radiographic studies and interpretation of complex data.   Critical Care Time devoted to patient care services, exclusive of separately billable procedures, described in this note is 35 minutes.   Marshell Garfinkel MD [facility] Pager [phone] If no answer or after 3pm call: [phone] 07/02/2017, 9:49 AM

## 2017-07-02 NOTE — Progress Notes (Signed)
eLink Physician-Brief Progress Note Patient Name: [name] DOB: 1986/02/15 MRN: [id number]   Date of Service  07/02/2017  HPI/Events of Note  Electrolyte panel reviewed. Notably patient has hypernatremia & hyperchloremia. Bicarbonate doubt 12. Anion gap 18. Potassium 3.0. Notably phosphorus less than 1.0. Magnesium within acceptable range. Has peripheral IV access. Currently on bicarbonate drip at 125 mL per hour.   eICU Interventions  1. Transitioning to D5 half-normal saline with 20 of KCl at 150 mL per hour 2. K-Phos 30 mmol IV 3. Repeat phosphorus level at noon 4. KCl 10 mEq IV 3 runs 5. Continuing to trend basic metabolic panel every 4 hours  6. Decreasing bicarbonate drip rate to 75 mL per hour      Intervention Category Major Interventions: Acid-Base disturbance - evaluation and management;Electrolyte abnormality - evaluation and management  Lawanda Cousins 07/02/2017, 4:48 AM

## 2017-07-02 NOTE — Progress Notes (Signed)
eLink Physician-Brief Progress Note Patient Name: DENY CHEVEZ DOB: 14-Oct-1986 MRN: [id number]   Date of Service  07/02/2017  HPI/Events of Note  Gap gone Insulin drip lower NOT ABLE TO EAT K low  eICU Interventions  k supp Lantus, dc drip in 1 hour Add SSI q4h moderate Keep some d5 in fluids as wont eat but follow trend glu  Once awake enough to eat would dc d51/2      Intervention Category Major Interventions: Electrolyte abnormality - evaluation and management  Lottie Sigman J. 07/02/2017, 4:27 PM

## 2017-07-02 NOTE — Progress Notes (Signed)
Pharmacy Antibiotic Note  [name] is a 31 y.o. male admitted on 07/01/2017 with AMS, hyperglycemia, and AKI.  Pharmacy has been consulted for vancomycin and Zosyn dosing for sepsis.  Today, 07/02/2017 Day #2 Vancomycin and Zosyn SCr improved 3.3 > 2.7 > 2.19 WBC improved 55.9 > 18.7 Lactate improved 2.13 > 1.6 Cultures pending Tmax 100   Plan: - Adjust Vancomycin to 750mg  IV q12h (received loading dose of 1750mg  IV x 1 yesterday ~12:00) - Continue Zosyn 3.375gm IV Q8H, 4 hr infusion - Monitor renal fxn, clinical progress, vanc trough as indicated, abx LOT - Daily SCr - Consider discontinuing Vancomycin with negative MRSA PCR as it has high negative predictive value for MRSA infection  Height: 6\' 4"  (193 cm) Weight: 193 lb 2 oz (87.6 kg) IBW/kg (Calculated) : 86.8  Temp (24hrs), Avg:94.6 F (34.8 C), Min:90.1 F (32.3 C), Max:100 F (37.8 C)   Recent Labs Lab 07/01/17 1019 07/01/17 1030 07/01/17 1035 07/01/17 1807 07/01/17 2100 07/02/17 0310  WBC 55.9*  --   --   --   --  18.7*  CREATININE 4.39* 3.30*  --   --  2.70* 2.19*  LATICACIDVEN  --   --  2.13* 1.6 1.6  --     Estimated Creatinine Clearance: 59.5 mL/min (A) (by C-G formula based on SCr of 2.19 mg/dL (H)).    Allergies  Allergen Reactions  . Sulfa Antibiotics Other (See Comments)    Unknown childhood allergy    Antimicrobials this admission:  7/15 Vanc >> 7/15 Zosyn >>  Dose adjustments this admission:  7/16 Adjust vancomycin from 1250mg  q24h to 750mg  q12h for improved SCr  Microbiology results:  7/15 BCx: sent 7/15 UCx: sent 7/15 MRSA PCR: neg   , [name], BCPS Pager: [phone] 07/02/2017, 7:44 AM

## 2017-07-02 NOTE — Progress Notes (Signed)
Pt refusing lab draws. Asking RN to remove IVs. Stating he wants to go home.

## 2017-07-03 LAB — BASIC METABOLIC PANEL
ANION GAP: 12 (ref 5–15)
BUN: 21 mg/dL — ABNORMAL HIGH (ref 6–20)
CALCIUM: 8 mg/dL — AB (ref 8.9–10.3)
CO2: 18 mmol/L — ABNORMAL LOW (ref 22–32)
CREATININE: 1.3 mg/dL — AB (ref 0.61–1.24)
Chloride: 115 mmol/L — ABNORMAL HIGH (ref 101–111)
Glucose, Bld: 299 mg/dL — ABNORMAL HIGH (ref 65–99)
Potassium: 2.9 mmol/L — ABNORMAL LOW (ref 3.5–5.1)
SODIUM: 145 mmol/L (ref 135–145)

## 2017-07-03 LAB — GLUCOSE, CAPILLARY
GLUCOSE-CAPILLARY: 291 mg/dL — AB (ref 65–99)
GLUCOSE-CAPILLARY: 321 mg/dL — AB (ref 65–99)
Glucose-Capillary: 228 mg/dL — ABNORMAL HIGH (ref 65–99)
Glucose-Capillary: 294 mg/dL — ABNORMAL HIGH (ref 65–99)
Glucose-Capillary: 239 mg/dL — ABNORMAL HIGH (ref 65–99)

## 2017-07-03 LAB — CBC WITH DIFFERENTIAL/PLATELET
BASOS ABS: 0 10*3/uL (ref 0.0–0.1)
Basophils Relative: 0 %
EOS ABS: 0 10*3/uL (ref 0.0–0.7)
EOS PCT: 0 %
HCT: 27.4 % — ABNORMAL LOW (ref 39.0–52.0)
Hemoglobin: 9.7 g/dL — ABNORMAL LOW (ref 13.0–17.0)
Lymphocytes Relative: 11 %
Lymphs Abs: 0.9 10*3/uL (ref 0.7–4.0)
MCH: 27.9 pg (ref 26.0–34.0)
MCHC: 35.4 g/dL (ref 30.0–36.0)
MCV: 78.7 fL (ref 78.0–100.0)
MONO ABS: 0.6 10*3/uL (ref 0.1–1.0)
Monocytes Relative: 7 %
Neutro Abs: 6.9 10*3/uL (ref 1.7–7.7)
Neutrophils Relative %: 82 %
PLATELETS: 178 10*3/uL (ref 150–400)
RBC: 3.48 MIL/uL — AB (ref 4.22–5.81)
RDW: 14.6 % (ref 11.5–15.5)
WBC: 8.4 10*3/uL (ref 4.0–10.5)

## 2017-07-03 LAB — PROCALCITONIN: Procalcitonin: 1.5 ng/mL

## 2017-07-03 LAB — MAGNESIUM: Magnesium: 1.9 mg/dL (ref 1.7–2.4)

## 2017-07-03 LAB — PHOSPHORUS: PHOSPHORUS: 1.7 mg/dL — AB (ref 2.5–4.6)

## 2017-07-03 MED ORDER — MORPHINE SULFATE (PF) 2 MG/ML IV SOLN
4.0000 mg | Freq: Once | INTRAVENOUS | Status: AC
  Administered 2017-07-03: 4 mg via INTRAVENOUS
  Filled 2017-07-03: qty 2

## 2017-07-03 MED ORDER — VANCOMYCIN HCL IN DEXTROSE 1-5 GM/200ML-% IV SOLN: 1000.0000 mg | Freq: Two times a day (BID) | INTRAVENOUS | Status: DC

## 2017-07-03 MED ORDER — INSULIN GLARGINE 100 UNIT/ML ~~LOC~~ SOLN
20.0000 [IU] | Freq: Every day | SUBCUTANEOUS | Status: DC
  Administered 2017-07-03: 20 [IU] via SUBCUTANEOUS
  Filled 2017-07-03: qty 0.2

## 2017-07-03 MED ORDER — SODIUM CHLORIDE 0.45 % IV SOLN
INTRAVENOUS | Status: DC
  Administered 2017-07-03: 01:00:00 via INTRAVENOUS

## 2017-07-03 MED ORDER — POTASSIUM PHOSPHATES 15 MMOLE/5ML IV SOLN
30.0000 mmol | Freq: Once | INTRAVENOUS | Status: AC
  Administered 2017-07-03: 30 mmol via INTRAVENOUS
  Filled 2017-07-03: qty 10

## 2017-07-03 MED ORDER — DEXMEDETOMIDINE HCL IN NACL 200 MCG/50ML IV SOLN
0.4000 ug/kg/h | INTRAVENOUS | Status: DC
  Administered 2017-07-03: 0.5 ug/kg/h via INTRAVENOUS
  Administered 2017-07-03: 0.7 ug/kg/h via INTRAVENOUS
  Administered 2017-07-03: 0.4 ug/kg/h via INTRAVENOUS
  Filled 2017-07-03 (×3): qty 50

## 2017-07-03 NOTE — Progress Notes (Signed)
Patient determined to leave AMA. This RN spoke with patient concerning need for medical attention and danger of leaving, however the patient insists on leaving Against Medical Advice. E-Link called by bedside RN and AMA papers signed.

## 2017-07-03 NOTE — Progress Notes (Signed)
PULMONARY / CRITICAL CARE MEDICINE   Name: [name] MRN: [id number] DOB: [birth year]    ADMISSION DATE:  07/01/2017 CONSULTATION DATE:  07/01/2017  REFERRING MD:  [name]  CHIEF COMPLAINT:  DKA with altered mental status  HISTORY OF PRESENT ILLNESS:   [name] is a 31 y.o. male with medical history significant of type 1 diabetes, insulin noncompliance, DKA, polysubstance abuse including tobacco, cocaine and marijuana, amphetamine, schizophrenia, GERD, depression, anxiety. He was brought to the ED after being found unresponsive by his roommate. The latter reported that 1 d pta he was complaining of increased thirst and polyuria but did not seed medical attention. He had 3 admissions last month for DKA; signing out AMA after the first and returned 3 days later. Initial labs after arrival revealed a K= of 7.9, pH 6.6 and glucose >700. No other history is available.  PAST MEDICAL HISTORY :  He  has a past medical history of Anemia; Anxiety; Arthritis; Chicken pox; Childhood asthma; Chronic pain; Depression; DKA (diabetic ketoacidoses) ([name]) (11/18/2014); GERD (gastroesophageal reflux disease); Hypertension; Migraine; Noncompliance with medication regimen; Pneumonia; Polysubstance abuse; Schizo affective schizophrenia ([name]); Scoliosis; and Type I diabetes mellitus ([name]).  PAST SURGICAL HISTORY: He  has a past surgical history that includes Incision and drainage abscess (Left, 11/2011) and Cardiac catheterization (11/2016).  Allergies  Allergen Reactions  . Sulfa Antibiotics Other (See Comments)    Unknown childhood allergy    No current facility-administered medications on file prior to encounter.    Current Outpatient Prescriptions on File Prior to Encounter  Medication Sig  . insulin aspart (NOVOLOG FLEXPEN) 100 UNIT/ML FlexPen CBG 70 - 120: 0 units CBG 121 - 150: 2 units CBG 151 - 200: 3 units CBG 201 - 250: 5 units CBG 251 - 300: 8 units CBG 301 - 350: 11  units CBG 351 - 400: 15 units (Patient taking differently: Inject 10 Units into the skin 3 (three) times daily with meals. CBG 70 - 120: 0 units CBG 121 - 150: 2 units CBG 151 - 200: 3 units CBG 201 - 250: 5 units CBG 251 - 300: 8 units CBG 301 - 350: 11 units CBG 351 - 400: 15 units)  . Insulin Detemir (LEVEMIR FLEXPEN) 100 UNIT/ML Pen Inject 20 Units into the skin daily at 10 pm. (Patient taking differently: Inject 45 Units into the skin daily at 10 pm. )  . metoCLOPramide (REGLAN) 10 MG tablet Take 1 tablet (10 mg total) by mouth 3 (three) times daily with meals.  . ondansetron (ZOFRAN ODT) 4 MG disintegrating tablet Take 1 tablet (4 mg total) by mouth every 8 (eight) hours as needed for nausea or vomiting.  . ondansetron (ZOFRAN) 4 MG tablet Take 1 tablet (4 mg total) by mouth every 8 (eight) hours as needed for nausea or vomiting.  [name] oxyCODONE (OXY IR/ROXICODONE) 5 MG immediate release tablet Take 1 tablet (5 mg total) by mouth every 6 (six) hours as needed for moderate pain.  . pantoprazole (PROTONIX) 40 MG tablet Take 1 tablet (40 mg total) by mouth daily.  . traZODone (DESYREL) 150 MG tablet Take 300 mg by mouth at bedtime.   . blood glucose meter kit and supplies KIT Dispense based on patient and insurance preference. Use up to four times daily as directed. (FOR ICD-9 250.00, 250.01). (Patient not taking: Reported on 07/01/2017)  . blood glucose meter kit and supplies KIT Dispense based on patient and insurance preference. Use up to four times daily as  directed. (FOR ICD-9 250.00, 250.01).  . feeding supplement, GLUCERNA SHAKE, (GLUCERNA SHAKE) LIQD Take 237 mLs by mouth 3 (three) times daily between meals.  [name] glucose blood test strip Use as instructed  . Insulin Pen Needle (PEN NEEDLES) 31G X 5 MM MISC 90 each by Does not apply route 4 (four) times daily -  before meals and at bedtime.  . Lancets (ACCU-CHEK MULTICLIX) lancets Use as directed  . senna-docusate (SENOKOT-S) 8.6-50 MG  tablet Take 2 tablets by mouth 2 (two) times daily.  . [DISCONTINUED] citalopram (CELEXA) 20 MG tablet Take 20 mg by mouth daily.  . [DISCONTINUED] FLUoxetine (PROZAC) 20 MG tablet Take 20 mg by mouth every morning.  . [DISCONTINUED] insulin NPH-regular Human (NOVOLIN 70/30) (70-30) 100 UNIT/ML injection Inject 40 Units into the skin 2 (two) times daily with a meal.  . [DISCONTINUED] OLANZapine zydis (ZYPREXA) 10 MG disintegrating tablet Take 10 mg by mouth 2 (two) times daily.  . [DISCONTINUED] sertraline (ZOLOFT) 50 MG tablet Take 50 mg by mouth daily. For depression. Just started med, have not picked up yet [facility] [address]    FAMILY HISTORY:  His indicated that his mother is alive.    SOCIAL HISTORY: He  reports that he has been smoking Cigarettes.  He has a 4.50 pack-year smoking history. He has never used smokeless tobacco. He reports that he uses drugs, including Marijuana, Cocaine, Methamphetamines, and "Crack" cocaine. He reports that he does not drink alcohol.  REVIEW OF SYSTEMS:   The patient does not respond to verbal commands, is moaning and unable to provide a history. At his admission on 06/15/2017 the following ROS was obtained:  General: no fevers, chills, no changes in body weight, has poor appetite, has fatigue HEENT: no blurry vision, hearing changes or sore throat Respiratory: no dyspnea, coughing, wheezing CV: no chest pain, no palpitations GI: has nausea, vomiting, abdominal pain, no diarrhea, constipation GU: no dysuria, burning on urination, increased urinary frequency, hematuria  Ext: no leg edema Neuro: no unilateral weakness, numbness, or tingling, no vision change or hearing loss Skin: no rash, no skin tear. MSK: No muscle spasm, no deformity, no limitation of range of movement in spin Heme: No easy bruising.  Travel history: No recent long distant travel.  SUBJECTIVE:  Started on precedex for agitation Off insulin drip  VITAL SIGNS:  BP 123/68  (BP Location: Left Arm)   Pulse 98   Temp 99.3 F (37.4 C) (Core (Comment))   Resp 15   Ht '6\' 4"'$  (1.93 m)   Wt 193 lb 5.5 oz (87.7 kg)   SpO2 97%   BMI 23.53 kg/m   HEMODYNAMICS: VS as above after 2L NS    VENTILATOR SETTINGS: Not intubated    INTAKE / OUTPUT: I/O last 3 completed shifts: In: 6088.3 [I.V.:4638.3; IV Piggyback:1450] Out: 1700 [Urine:6170]  PHYSICAL EXAMINATION: Gen:      No acute distress HEENT:  EOMI, sclera anicteric Neck:     No masses; no thyromegaly Lungs:    Clear to auscultation bilaterally; normal respiratory effort CV:         Regular rate and rhythm; no murmurs Abd:      + bowel sounds; soft, non-tender; no palpable masses, no distension Ext:    No edema; adequate peripheral perfusion Skin:      Warm and dry; no rash Neuro: alert and oriented x 3 Psych: normal mood and affect   LABS:  BMET  Recent Labs Lab 07/02/17 1013 07/02/17 1510  07/03/17 0316  NA 144 146* 145  K 3.3* 3.2* 2.9*  CL 117* 117* 115*  CO2 18* 21* 18*  BUN 35* 31* 21*  CREATININE 1.71* 1.47* 1.30*  GLUCOSE 197* 169* 299*    Electrolytes  Recent Labs Lab 07/02/17 1013 07/02/17 1510 07/03/17 0316  CALCIUM 7.2* 7.5* 8.0*  MG 1.6* 1.8 1.9  PHOS 2.7 2.5 1.7*    CBC  Recent Labs Lab 07/01/17 1019 07/01/17 1030 07/02/17 0310 07/03/17 0316  WBC 55.9*  --  18.7* 8.4  HGB 12.7* 13.9 10.8* 9.7*  HCT 40.7 41.0 29.8* 27.4*  PLT 525*  --  273 178    Coag's  Recent Labs Lab 07/01/17 1807  APTT 27  INR 1.49    Sepsis Markers  Recent Labs Lab 07/01/17 1609 07/01/17 1807 07/01/17 2100  LATICACIDVEN TEST REQUEST RECEIVED WITHOUT APPROPRIATE SPECIMEN 1.6 1.6  PROCALCITON  --  5.22  --     ABG  Recent Labs Lab 07/01/17 1320 07/01/17 2245 07/02/17 1013  PHART BELOW REPORTABLE RANGE  7.258* 7.447  PCO2ART BELOW REPORTABLE RANGE CRITICAL RESULT CALLED TO, READ BACK BY AND VERIFIED WITH: 27.0*  PO2ART 149.00* 56.5* 99.4    Liver  Enzymes  Recent Labs Lab 07/01/17 1019  AST 14*  ALT 19  ALKPHOS 158*  BILITOT 1.9*  ALBUMIN 3.6    Cardiac Enzymes  Recent Labs Lab 07/01/17 1807  TROPONINI <0.03    Glucose  Recent Labs Lab 07/02/17 1622 07/02/17 1728 07/02/17 2025 07/03/17 0103 07/03/17 0107 07/03/17 0740  GLUCAP 147* 129* 185* 321* 291* 228*    Imaging No results found.  STUDIES:    CULTURES: Bcx 7/15 > Ucx 7/15 >  ANTIBIOTICS: Zosyn 7/15 > Vanco 7/15 >  SIGNIFICANT EVENTS: As above  LINES/TUBES: Lt femoral line 7/15 > removed PIV X 3  DISCUSSION: 31 Y/O with recurrent DKA secondary to substance abuse, non compliance.   ASSESSMENT / PLAN:  PULMONARY A: Acute resp failure. Improing P:   Monitor  CARDIOVASCULAR A:  Hemodynamically labile. Has received 3L NS.  P:  Tele monitoring  RENAL A:   Azotemia increased from BUN/Cr of 7/0.61 on 06/19/2017 indicative of AKI, but dehydrated as well Gap acidosis is resolving, Has peristent non gap acidosis P:   Continue 1/2 NS at 50. Stop fluids when he is eating reliably  GASTROINTESTINAL A:   Stable P:   Start diet  HEMATOLOGIC A:   WBC 55.9 K (likely leukemoid rxn) with thrombocytosis (probably an acute phase response)  P:  Follow CBC  INFECTIOUS A:   No focal evidence of infection but Pct is high P:   Continue vanco, zosyn. Low threshold to DC if Pct is improving  ENDOCRINE A:   DKA   P:   On lantus and SSI Increase lantus to 20 units  NEUROLOGIC A:   Altered MS likely secondary to DKA. Imrpoving P:   On precedex for agitation > wean down  FAMILY  - Updates: No family available  The patient is critically ill with multiple organ system failure and requires high complexity decision making for assessment and support, frequent evaluation and titration of therapies, advanced monitoring, review of radiographic studies and interpretation of complex data.   Critical Care Time devoted to patient care  services, exclusive of separately billable procedures, described in this note is 35 minutes.   Marshell Garfinkel MD [facility] Pager [phone] If no answer or after 3pm call: [phone] 07/03/2017, 9:08  AM

## 2017-07-03 NOTE — Progress Notes (Signed)
Pharmacy Antibiotic Note  [name] is a 31 y.o. male admitted on 07/01/2017 with AMS, hyperglycemia, and AKI.  Pharmacy has been consulted for vancomycin and Zosyn dosing for sepsis.  Today, 07/03/2017 Day #3 Vancomycin and Zosyn SCr improved 3.3 > 2.7 > 2.19 > 1.3 WBC improved 55.9 > 18.7 > 8,4 Lactate improved 2.13 > 1.6 Cultures pending Tmax 99.5   Plan: - Adjust Vancomycin to 1g IV q12h - Continue Zosyn 3.375gm IV Q8H, 4 hr infusion - Monitor renal fxn, clinical progress, vanc trough as indicated, abx LOT - Daily SCr - Consider discontinuing Vancomycin with negative MRSA PCR as it has high negative predictive value for MRSA infection  Height: 6\' 4"  (193 cm) Weight: 193 lb 5.5 oz (87.7 kg) IBW/kg (Calculated) : 86.8  Temp (24hrs), Avg:99.7 F (37.6 C), Min:99.3 F (37.4 C), Max:100 F (37.8 C)   Recent Labs Lab 07/01/17 1019  07/01/17 1035 07/01/17 1309 07/01/17 1609 07/01/17 1807 07/01/17 2100 07/02/17 0310 07/02/17 0749 07/02/17 1013 07/02/17 1510 07/03/17 0316  WBC 55.9*  --   --   --   --   --   --  18.7*  --   --   --  8.4  CREATININE 4.39*  < >  --   --   --   --  2.70* 2.19* 1.91* 1.71* 1.47* 1.30*  LATICACIDVEN  --   --  2.13* TEST REQUEST RECEIVED WITHOUT APPROPRIATE SPECIMEN TEST REQUEST RECEIVED WITHOUT APPROPRIATE SPECIMEN 1.6 1.6  --   --   --   --   --   < > = values in this interval not displayed.  Estimated Creatinine Clearance: 100.2 mL/min (A) (by C-G formula based on SCr of 1.3 mg/dL (H)).    Allergies  Allergen Reactions  . Sulfa Antibiotics Other (See Comments)    Unknown childhood allergy    Antimicrobials this admission:  7/15 Vanc >> 7/15 Zosyn >>  Dose adjustments this admission:  7/16 increase vancomycin from 1250mg  q24h to 750mg  q12h for improved SCr 7/17 increase from 750mg  q12h to 1g q12h for improved SCr  Microbiology results:  7/15 BCx: ngtd 7/15 UCx: NGF 7/15 MRSA PCR: neg   , [name],  BCPS Pager: [phone] 07/03/2017, 7:35 AM

## 2017-07-03 NOTE — Progress Notes (Signed)
eLink Physician-Brief Progress Note Patient Name: TEMITAYO COVALT DOB: 09/26/1986 MRN: [id number]   Date of Service  07/03/2017  HPI/Events of Note  DKA now resolved. Patient transitioned off insulin drip & started on subcutaneous insulin this evening. Currently on dextrose infusion and rising glucose level.   eICU Interventions  1. Discontinuing dextrose infusion 2. Switching to half-normal saline at 50 mL per hour maintenance IV fluid 3. Continuing Lantus & sliding scale insulin as ordered      Intervention Category Major Interventions: Electrolyte abnormality - evaluation and management;Hyperglycemia - active titration of insulin therapy  Lawanda Cousins 07/03/2017, 1:20 AM

## 2017-07-03 NOTE — Progress Notes (Signed)
eLink Physician-Brief Progress Note Patient Name: [name] DOB: 1986/05/05 MRN: [id number]   Date of Service  07/03/2017  HPI/Events of Note  Nurse reports patient is uncooperative and agitated. Has her long QTC on review of EKG. Questionable altered mentation due to DKA or possibly substance abuse. Camera shows patient with eyes closed resting comfortably.   eICU Interventions  Precedex ordered as needed to maintain patient's comfort and state of calm.      Intervention Category Major Interventions: Delirium, psychosis, severe agitation - evaluation and management  Lawanda Cousins 07/03/2017, 12:12 AM

## 2017-07-03 NOTE — Progress Notes (Signed)
Pt called RN into room stating he wanted to leave AMA. I advised him of the risks and had charge RN reinforce risks as well. Pt stated he wanted to still leave, signed AMA paperwork.

## 2017-07-03 NOTE — Progress Notes (Signed)
eLink Physician-Brief Progress Note Patient Name: [name] DOB: 12/05/1986 MRN: [id number]   Date of Service  07/03/2017  HPI/Events of Note  K 2.9 & Phos 1.7.  eICU Interventions  Kphos IV     Intervention Category Intermediate Interventions: Electrolyte abnormality - evaluation and management  Lawanda Cousins 07/03/2017, 5:48 AM

## 2017-07-04 ENCOUNTER — Inpatient Hospital Stay (HOSPITAL_COMMUNITY)
Admission: EM | Admit: 2017-07-04 | Discharge: 2017-07-06 | DRG: 638 | Disposition: A | Discharge status: Home / Self Care | Payer: Medicare Other | Attending: Internal Medicine | Admitting: Internal Medicine

## 2017-07-04 ENCOUNTER — Emergency Department (HOSPITAL_COMMUNITY): Payer: Medicare Other

## 2017-07-04 ENCOUNTER — Encounter (HOSPITAL_COMMUNITY): Payer: Self-pay | Admitting: Emergency Medicine

## 2017-07-04 DIAGNOSIS — K219 Gastro-esophageal reflux disease without esophagitis: Secondary | ICD-10-CM | POA: Diagnosis present

## 2017-07-04 DIAGNOSIS — Z91199 Patient's noncompliance with other medical treatment and regimen due to unspecified reason: Secondary | ICD-10-CM

## 2017-07-04 DIAGNOSIS — Z833 Family history of diabetes mellitus: Secondary | ICD-10-CM

## 2017-07-04 DIAGNOSIS — E1010 Type 1 diabetes mellitus with ketoacidosis without coma: Secondary | ICD-10-CM | POA: Diagnosis not present

## 2017-07-04 DIAGNOSIS — Z9119 Patient's noncompliance with other medical treatment and regimen: Secondary | ICD-10-CM

## 2017-07-04 DIAGNOSIS — F1910 Other psychoactive substance abuse, uncomplicated: Secondary | ICD-10-CM | POA: Diagnosis present

## 2017-07-04 DIAGNOSIS — M25519 Pain in unspecified shoulder: Secondary | ICD-10-CM

## 2017-07-04 DIAGNOSIS — E1042 Type 1 diabetes mellitus with diabetic polyneuropathy: Secondary | ICD-10-CM | POA: Diagnosis present

## 2017-07-04 DIAGNOSIS — N179 Acute kidney failure, unspecified: Secondary | ICD-10-CM | POA: Diagnosis present

## 2017-07-04 DIAGNOSIS — R0789 Other chest pain: Secondary | ICD-10-CM | POA: Diagnosis present

## 2017-07-04 DIAGNOSIS — E1065 Type 1 diabetes mellitus with hyperglycemia: Secondary | ICD-10-CM | POA: Diagnosis present

## 2017-07-04 DIAGNOSIS — D649 Anemia, unspecified: Secondary | ICD-10-CM | POA: Diagnosis present

## 2017-07-04 DIAGNOSIS — R079 Chest pain, unspecified: Secondary | ICD-10-CM | POA: Diagnosis present

## 2017-07-04 DIAGNOSIS — Z794 Long term (current) use of insulin: Secondary | ICD-10-CM

## 2017-07-04 DIAGNOSIS — F259 Schizoaffective disorder, unspecified: Secondary | ICD-10-CM | POA: Diagnosis present

## 2017-07-04 DIAGNOSIS — S90822A Blister (nonthermal), left foot, initial encounter: Secondary | ICD-10-CM | POA: Diagnosis present

## 2017-07-04 DIAGNOSIS — E1069 Type 1 diabetes mellitus with other specified complication: Secondary | ICD-10-CM

## 2017-07-04 DIAGNOSIS — K5900 Constipation, unspecified: Secondary | ICD-10-CM

## 2017-07-04 DIAGNOSIS — R112 Nausea with vomiting, unspecified: Secondary | ICD-10-CM | POA: Diagnosis present

## 2017-07-04 DIAGNOSIS — F203 Undifferentiated schizophrenia: Secondary | ICD-10-CM | POA: Diagnosis present

## 2017-07-04 DIAGNOSIS — R200 Anesthesia of skin: Secondary | ICD-10-CM | POA: Diagnosis present

## 2017-07-04 DIAGNOSIS — I10 Essential (primary) hypertension: Secondary | ICD-10-CM | POA: Diagnosis present

## 2017-07-04 DIAGNOSIS — F17210 Nicotine dependence, cigarettes, uncomplicated: Secondary | ICD-10-CM | POA: Diagnosis present

## 2017-07-04 DIAGNOSIS — Z79899 Other long term (current) drug therapy: Secondary | ICD-10-CM

## 2017-07-04 DIAGNOSIS — Z882 Allergy status to sulfonamides status: Secondary | ICD-10-CM

## 2017-07-04 DIAGNOSIS — Z9114 Patient's other noncompliance with medication regimen: Secondary | ICD-10-CM

## 2017-07-04 DIAGNOSIS — W19XXXA Unspecified fall, initial encounter: Secondary | ICD-10-CM

## 2017-07-04 DIAGNOSIS — E860 Dehydration: Secondary | ICD-10-CM | POA: Diagnosis present

## 2017-07-04 DIAGNOSIS — Y92009 Unspecified place in unspecified non-institutional (private) residence as the place of occurrence of the external cause: Secondary | ICD-10-CM

## 2017-07-04 LAB — CBC
HEMATOCRIT: 33 % — AB (ref 39.0–52.0)
HEMOGLOBIN: 11.1 g/dL — AB (ref 13.0–17.0)
MCH: 28.2 pg (ref 26.0–34.0)
MCHC: 33.6 g/dL (ref 30.0–36.0)
MCV: 84 fL (ref 78.0–100.0)
Platelets: 171 10*3/uL (ref 150–400)
RBC: 3.93 MIL/uL — AB (ref 4.22–5.81)
RDW: 13.9 % (ref 11.5–15.5)
WBC: 5.1 10*3/uL (ref 4.0–10.5)

## 2017-07-04 LAB — I-STAT VENOUS BLOOD GAS, ED
Acid-base deficit: 8 mmol/L — ABNORMAL HIGH (ref 0.0–2.0)
BICARBONATE: 17 mmol/L — AB (ref 20.0–28.0)
O2 Saturation: 41 %
PCO2 VEN: 33.1 mmHg — AB (ref 44.0–60.0)
PH VEN: 7.319 (ref 7.250–7.430)
TCO2: 18 mmol/L (ref 0–100)
pO2, Ven: 25 mmHg — CL (ref 32.0–45.0)

## 2017-07-04 LAB — BASIC METABOLIC PANEL
ANION GAP: 18 — AB (ref 5–15)
BUN: 13 mg/dL (ref 6–20)
CALCIUM: 9 mg/dL (ref 8.9–10.3)
CHLORIDE: 104 mmol/L (ref 101–111)
CO2: 17 mmol/L — AB (ref 22–32)
Creatinine, Ser: 1.35 mg/dL — ABNORMAL HIGH (ref 0.61–1.24)
GFR calc non Af Amer: >60 mL/min (ref >60)
GLUCOSE: 418 mg/dL — AB (ref 65–99)
POTASSIUM: 3.6 mmol/L (ref 3.5–5.1)
Sodium: 139 mmol/L (ref 135–145)

## 2017-07-04 LAB — I-STAT TROPONIN, ED: TROPONIN I, POC: 0.13 ng/mL — AB (ref 0.00–0.08)

## 2017-07-04 LAB — D-DIMER, QUANTITATIVE: D-Dimer, Quant: 1.58 ug/mL-FEU — ABNORMAL HIGH (ref 0.00–0.50)

## 2017-07-04 MED ORDER — SODIUM CHLORIDE 0.9 % IV BOLUS (SEPSIS)
1000.0000 mL | Freq: Once | INTRAVENOUS | Status: AC
  Administered 2017-07-04: 1000 mL via INTRAVENOUS

## 2017-07-04 MED ORDER — DEXTROSE-NACL 5-0.45 % IV SOLN: INTRAVENOUS | Status: DC

## 2017-07-04 MED ORDER — ACETAMINOPHEN 325 MG PO TABS
650.0000 mg | ORAL_TABLET | Freq: Once | ORAL | Status: DC
  Filled 2017-07-04: qty 2

## 2017-07-04 MED ORDER — ONDANSETRON HCL 4 MG/2ML IJ SOLN
4.0000 mg | Freq: Once | INTRAMUSCULAR | Status: AC
  Administered 2017-07-04: 4 mg via INTRAVENOUS
  Filled 2017-07-04: qty 2

## 2017-07-04 MED ORDER — SODIUM CHLORIDE 0.9 % IV SOLN
INTRAVENOUS | Status: DC
  Filled 2017-07-04: qty 1

## 2017-07-04 MED ORDER — POTASSIUM CHLORIDE CRYS ER 20 MEQ PO TBCR
40.0000 meq | EXTENDED_RELEASE_TABLET | Freq: Once | ORAL | Status: DC
  Filled 2017-07-04: qty 2

## 2017-07-04 NOTE — ED Triage Notes (Signed)
Reports being in hospital recently.  Left AMA on Tuesday.  Reporting left side chest pain and right hand numbness.  Reports chest pain started today but hand was numb prior to leaving hospital.

## 2017-07-04 NOTE — ED Notes (Signed)
Dr. Criss Alvine to attempt [place] IV on pt.

## 2017-07-04 NOTE — ED Notes (Signed)
Due to patient's elevated Troponin, acuity upgraded and will prioritize rooming.

## 2017-07-04 NOTE — ED Provider Notes (Addendum)
[facility] DEPT Provider Note   CSN: [id number] Arrival date & time: 07/04/17  2054     History   Chief Complaint Chief Complaint  Patient presents with  . Chest Pain  . right hand numbness    HPI [name] is a 31 y.o. male.  HPI  31 year old male with a history of type 1 diabetes with noncompliance and frequent DKA as well as polysubstance abuse resents with left-sided sharp chest pain since this morning. Also complaining of right hand numbness since yesterday. He was admitted on 7/15 for severe DKA with a pH of 6.9. He states he woke up yesterday in the hospital and then left AMA. He states when he first woke up he felt right hand numbness. The hand numbness is diffuse over his hand. There is no weakness. No leg symptoms. No headache or neck pain. He has been having left-sided sharp, continuous chest pain since this morning. He got a good better but hasn't. His been perpetually nauseated which is also a chronic problem. Unable to eat or drink. No current vomiting. No shortness of breath.  Past Medical History:  Diagnosis Date  . Anemia   . Anxiety   . Arthritis    "both hips; both shoulders" (06/06/2017)  . Chicken pox   . Childhood asthma   . Chronic pain   . Depression   . DKA (diabetic ketoacidoses) ([place]) 11/18/2014  . GERD (gastroesophageal reflux disease)   . Hypertension   . Migraine    "a few/year" (06/06/2017)  . Noncompliance with medication regimen   . Pneumonia    "several times" (06/06/2017)  . Polysubstance abuse   . Schizo affective schizophrenia ([place])   . Scoliosis   . Type I diabetes mellitus [facility])     Patient Active Problem List   Diagnosis Date Noted  . Normocytic anemia 07/05/2017  . Numbness of right hand 07/05/2017  . DKA, type 1, not at goal [facility]) 07/01/2017  . Acute kidney injury (nontraumatic) ([place])   . Medically noncompliant   . Hyperglycemia 06/16/2017  . Nausea & vomiting 06/15/2017  . Abdominal pain 06/15/2017  . Chest  pain 06/07/2017  . GERD (gastroesophageal reflux disease) 06/07/2017  . Substance abuse 05/30/2016  . Substance induced mood disorder ([place]) 05/30/2016  . Tobacco use disorder 12/24/2015  . DM hyperosmolarity type I, uncontrolled ([place]) 12/19/2015  . Generalized headache 07/22/2015  . Bilateral hip bursitis 05/26/2015  . Depression   . Generalized anxiety disorder 03/29/2015  . Undifferentiated schizophrenia ([place])   . Hip pain, bilateral 11/18/2014  . Dehydration 06/17/2012    Past Surgical History:  Procedure Laterality Date  . CARDIAC CATHETERIZATION  11/2016  . INCISION AND DRAINAGE ABSCESS Left 11/2011   "MRSA removed off my thumb"       Home Medications    Prior to Admission medications   Medication Sig Start Date End Date Taking? Authorizing Provider  amphetamine-dextroamphetamine (ADDERALL) 10 MG tablet Take 30 mg by mouth daily with breakfast.   Yes [provider]  blood glucose meter kit and supplies KIT Dispense based on patient and insurance preference. Use up to four times daily as directed. (FOR ICD-9 250.00, 250.01). 06/18/17  Yes Amin, Ankit Chirag, MD  glucose blood test strip Use as instructed 06/18/17  Yes Amin, Ankit Chirag, MD  insulin aspart (NOVOLOG FLEXPEN) 100 UNIT/ML FlexPen CBG 70 - 120: 0 units CBG 121 - 150: 2 units CBG 151 - 200: 3 units CBG 201 - 250: 5 units CBG  251 - 300: 8 units CBG 301 - 350: 11 units CBG 351 - 400: 15 units Patient taking differently: Inject 10 Units into the skin 3 (three) times daily with meals. CBG 70 - 120: 0 units CBG 121 - 150: 2 units CBG 151 - 200: 3 units CBG 201 - 250: 5 units CBG 251 - 300: 8 units CBG 301 - 350: 11 units CBG 351 - 400: 15 units 06/11/17  Yes Hosie Poisson, MD  Insulin Detemir (LEVEMIR FLEXPEN) 100 UNIT/ML Pen Inject 20 Units into the skin daily at 10 pm. Patient taking differently: Inject 45 Units into the skin daily at 10 pm.  06/18/17  Yes Amin, Ankit Chirag, MD  Insulin Pen Needle (PEN  NEEDLES) 31G X 5 MM MISC 90 each by Does not apply route 4 (four) times daily -  before meals and at bedtime. 06/11/17  Yes Hosie Poisson, MD  Lancets (ACCU-CHEK MULTICLIX) lancets Use as directed 06/18/17  Yes Amin, Ankit Chirag, MD  LORazepam (ATIVAN PO) Take 1 tablet by mouth daily as needed (anxity).   Yes [provider]  metoCLOPramide (REGLAN) 10 MG tablet Take 1 tablet (10 mg total) by mouth 3 (three) times daily with meals. 06/11/17 06/11/18 Yes Hosie Poisson, MD  ondansetron (ZOFRAN ODT) 4 MG disintegrating tablet Take 1 tablet (4 mg total) by mouth every 8 (eight) hours as needed for nausea or vomiting. 06/19/17  Yes Amin, Ankit Chirag, MD  ondansetron (ZOFRAN) 4 MG tablet Take 1 tablet (4 mg total) by mouth every 8 (eight) hours as needed for nausea or vomiting. 06/11/17 06/11/18 Yes Hosie Poisson, MD  pantoprazole (PROTONIX) 40 MG tablet Take 1 tablet (40 mg total) by mouth daily. 06/10/17  Yes Hosie Poisson, MD  traZODone (DESYREL) 150 MG tablet Take 150 mg by mouth at bedtime.    Yes [provider]    Family History Family History  Problem Relation Age of Onset  . Diabetes Mother     Social History Social History  Substance Use Topics  . Smoking status: Current Every Day Smoker    Packs/day: 0.50    Years: 9.00    Types: Cigarettes  . Smokeless tobacco: Never Used  . Alcohol use No     Allergies   Sulfa antibiotics   Review of Systems Review of Systems  Constitutional: Negative for fever.  Respiratory: Negative for shortness of breath.   Cardiovascular: Positive for chest pain.  Gastrointestinal: Positive for nausea. Negative for abdominal pain and vomiting.  Musculoskeletal: Negative for back pain, neck pain and neck stiffness.  Neurological: Positive for numbness. Negative for weakness and headaches.  All other systems reviewed and are negative.    Physical Exam Updated Vital Signs BP 127/83   Pulse (!) 108   Temp 98.1 F (36.7 C) (Oral)    Resp 19   Ht _0  (1.854 m)   Wt 90.7 kg (200 lb)   SpO2 100%   BMI 26.39 kg/m   Physical Exam  Constitutional: He is oriented to person, place, and time. He appears well-developed and well-nourished.  HENT:  Head: Normocephalic and atraumatic.  Right Ear: External ear normal.  Left Ear: External ear normal.  Nose: Nose normal.  Mouth/Throat: Mucous membranes are dry.  Eyes: Pupils are equal, round, and reactive to light. EOM are normal. Right eye exhibits no discharge. Left eye exhibits no discharge.  Neck: Normal range of motion. Neck supple.  Cardiovascular: Regular rhythm and normal heart sounds.  Tachycardia present.  Pulses:      Radial pulses are 2+ on the right side, and 2+ on the left side.  Pulmonary/Chest: Effort normal and breath sounds normal. He exhibits no tenderness.  Abdominal: Soft. There is no tenderness.  Musculoskeletal: He exhibits no edema.  Neurological: He is alert and oriented to person, place, and time.  CN 3-12 grossly intact. 5/5 strength in all 4 extremities.  Normal finger to nose. Grossly normal sensation in all 4 extremities to light touch. However he cannot differentiate sharp and dull touch to either the right or left hands. Normal radial, ulnar and median nerve testing.  Skin: Skin is warm and dry.  Nursing note and vitals reviewed.    ED Treatments / Results  Labs (all labs ordered are listed, but only abnormal results are displayed) Labs Reviewed  BASIC METABOLIC PANEL - Abnormal; Notable for the following:       Result Value   CO2 17 (*)    Glucose, Bld 418 (*)    Creatinine, Ser 1.35 (*)    Anion gap 18 (*)    All other components within normal limits  CBC - Abnormal; Notable for the following:    RBC 3.93 (*)    Hemoglobin 11.1 (*)    HCT 33.0 (*)    All other components within normal limits  D-DIMER, QUANTITATIVE (NOT AT [facility]) - Abnormal; Notable for the following:    D-Dimer, Quant 1.58 (*)    All other components within  normal limits  I-STAT TROPONIN, ED - Abnormal; Notable for the following:    Troponin i, poc 0.13 (*)    All other components within normal limits  I-STAT VENOUS BLOOD GAS, ED - Abnormal; Notable for the following:    pCO2, Ven 33.1 (*)    pO2, Ven 25.0 (*)    Bicarbonate 17.0 (*)    Acid-base deficit 8.0 (*)    All other components within normal limits  CBG MONITORING, ED - Abnormal; Notable for the following:    Glucose-Capillary 513 (*)    All other components within normal limits  URINALYSIS, ROUTINE W REFLEX MICROSCOPIC  BASIC METABOLIC PANEL  BASIC METABOLIC PANEL  BASIC METABOLIC PANEL  BASIC METABOLIC PANEL  BASIC METABOLIC PANEL  TROPONIN I  TROPONIN I  TROPONIN I  RAPID URINE DRUG SCREEN, HOSP PERFORMED    EKG  EKG Interpretation  Date/Time:  Wednesday July 04 2017 20:57:02 EDT Ventricular Rate:  117 PR Interval:  130 QRS Duration: 74 QT Interval:  354 QTC Calculation: 493 R Axis:   -22 Text Interpretation:  Sinus tachycardia Possible Left atrial enlargement RSR' or QR pattern in V1 suggests right ventricular conduction delay Cannot rule out Anterior infarct , age undetermined Abnormal ECG ST elevations and peaked T waves less prominent compared to July 01 2017 Confirmed by Sherwood Gambler [phone]) on 07/04/2017 9:48:49 PM       Radiology Dg Chest 2 View  Result Date: 07/04/2017 CLINICAL DATA:  Chest pain EXAM: CHEST  2 VIEW COMPARISON:  Chest radiograph 07/01/2017 FINDINGS: The heart size and mediastinal contours are within normal limits. Both lungs are clear. The visualized skeletal structures are unremarkable. IMPRESSION: No active cardiopulmonary disease. Electronically Signed   By: Ulyses Jarred M.D.   On: 07/04/2017 21:55   Ct Head Wo Contrast  Result Date: 07/04/2017 CLINICAL DATA:  Right hand numbness, polysubstance abuse. EXAM: CT HEAD WITHOUT CONTRAST TECHNIQUE: Contiguous axial images were obtained from the base of the skull through the vertex without  intravenous contrast. COMPARISON:  08/19/2011 FINDINGS: Brain: No evidence of acute infarction, hemorrhage, hydrocephalus, extra-axial collection or mass lesion/mass effect. Vascular: No hyperdense vessel or unexpected calcification. Skull: Normal. Negative for fracture or focal lesion. Sinuses/Orbits: No acute finding. Other: None IMPRESSION: No acute intracranial abnormality or significant change. Electronically Signed   By: Ashley Royalty M.D.   On: 07/04/2017 23:43    Procedures Procedures (including critical care time)  CRITICAL CARE Performed by: Sherwood Gambler T   Total critical care time: 30 minutes  Critical care time was exclusive of separately billable procedures and treating other patients.  Critical care was necessary to treat or prevent imminent or life-threatening deterioration.  Critical care was time spent personally by me on the following activities: development of treatment plan with patient and/or surrogate as well as nursing, discussions with consultants, evaluation of patient's response to treatment, examination of patient, obtaining history from patient or surrogate, ordering and performing treatments and interventions, ordering and review of laboratory studies, ordering and review of radiographic studies, pulse oximetry and re-evaluation of patient's condition.   Angiocath insertion Performed by: Sherwood Gambler T  Consent: Verbal consent obtained. Risks and benefits: risks, benefits and alternatives were discussed Time out: Immediately prior to procedure a "time out" was called to verify the correct patient, procedure, equipment, support staff and site/side marked as required.  Preparation: Patient was prepped and draped in the usual sterile fashion.  Vein Location: right basilic  Ultrasound Guided  Gauge: 20  Normal blood return and flush without difficulty Patient tolerance: Patient tolerated the procedure well with no immediate  complications.   Medications Ordered in ED Medications  acetaminophen (TYLENOL) tablet 650 mg (650 mg Oral Refused 07/05/17 0016)  LORazepam (ATIVAN) tablet 0.5-1 mg (not administered)  pantoprazole (PROTONIX) EC tablet 40 mg (not administered)  traZODone (DESYREL) tablet 150 mg (not administered)  aspirin chewable tablet 324 mg (not administered)    Or  aspirin suppository 300 mg (not administered)  nitroGLYCERIN (NITROSTAT) SL tablet 0.4 mg (not administered)  acetaminophen (TYLENOL) tablet 650 mg (not administered)  ondansetron (ZOFRAN) injection 4 mg (not administered)  0.9 %  sodium chloride infusion (not administered)  dextrose 5 %-0.45 % sodium chloride infusion (not administered)  insulin regular (NOVOLIN R,HUMULIN R) 100 Units in sodium chloride 0.9 % 100 mL (1 Units/mL) infusion (not administered)  enoxaparin (LOVENOX) injection 40 mg (not administered)  traMADol (ULTRAM) tablet 50-100 mg (not administered)  potassium chloride 20 MEQ/15ML (10%) solution 30 mEq (not administered)  sodium chloride 0.9 % bolus 1,000 mL (1,000 mLs Intravenous New Bag/Given 07/04/17 2337)  ondansetron (ZOFRAN) injection 4 mg (4 mg Intravenous Given 07/04/17 2337)  sodium chloride 0.9 % bolus 1,000 mL (1,000 mLs Intravenous New Bag/Given 07/04/17 2337)     Initial Impression / Assessment and Plan / ED Course  I have reviewed the triage vital signs and the nursing notes.  Pertinent labs & imaging results that were available during my care of the patient were reviewed by me and considered in my medical decision making (see chart for details).     Patient's left chest pain is atypical. Given recent admission and current tachycardia (probably from DKA), ddimer sent for otherwise low-moderate risk PE. Is elevated. Still has AKI compared to his baseline (0.6 is baseline), thus will do VQ instead of CTA. IV fluids, then start insulin. K low-normal, will give oral K replacement. Mild troponin elevation,  unclear cause, will cycle, get VQ. His hand numbness is diffuse, possibly peripheral neuropathy  from diabetes, or perhaps how he was laying when unconscious a few days ago. May need MRI or further studies, though I think acute stroke is less likely. He is asking for something stronger than tylenol for pain, but given his noncompliance and history of substance abuse, I don't think narcotics are warranted or in his best interest. D/w Dr. Myna Hidalgo who will admit for DKA.  Final Clinical Impressions(s) / ED Diagnoses   Final diagnoses:  Diabetic ketoacidosis without coma associated with type 1 diabetes mellitus ([name])    New Prescriptions New Prescriptions   No medications on file     Sherwood Gambler, MD 07/05/17 0040    Sherwood Gambler, MD 07/05/17 [phone]

## 2017-07-04 NOTE — ED Notes (Signed)
Pt refused tylenol. Explained to pt that per Dr. Criss Alvine, ibuprofen not appropriate due to pt kidney function. Pt states "I want to speak to his boss." Explained to pt that Dr. Criss Alvine is the provider in charge of his care and is making clinical decisions based on pt history, current symptoms, and work up. Pt then stated "Well then I want a second opinion." Pt then reports "Well I want to talk to him then, I want to talk to the doctor." Dr. Criss Alvine aware, speaking to pt at this time.
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
## 2017-07-04 NOTE — ED Notes (Signed)
Patient transported to CT 

## 2017-07-04 NOTE — ED Notes (Addendum)
Unsuccessful attempt to start IV x 2.  
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
## 2017-07-04 NOTE — ED Notes (Addendum)
Elevated Trop of 0.13 reported to [facility] P.-RN @ [facility]

## 2017-07-05 ENCOUNTER — Inpatient Hospital Stay (HOSPITAL_COMMUNITY): Payer: Medicare Other

## 2017-07-05 ENCOUNTER — Encounter (HOSPITAL_COMMUNITY): Payer: Self-pay | Admitting: Family Medicine

## 2017-07-05 DIAGNOSIS — Z794 Long term (current) use of insulin: Secondary | ICD-10-CM | POA: Diagnosis not present

## 2017-07-05 DIAGNOSIS — Z79899 Other long term (current) drug therapy: Secondary | ICD-10-CM | POA: Diagnosis not present

## 2017-07-05 DIAGNOSIS — R7989 Other specified abnormal findings of blood chemistry: Secondary | ICD-10-CM | POA: Diagnosis not present

## 2017-07-05 DIAGNOSIS — R079 Chest pain, unspecified: Secondary | ICD-10-CM

## 2017-07-05 DIAGNOSIS — Z9119 Patient's noncompliance with other medical treatment and regimen: Secondary | ICD-10-CM

## 2017-07-05 DIAGNOSIS — S90822A Blister (nonthermal), left foot, initial encounter: Secondary | ICD-10-CM | POA: Diagnosis present

## 2017-07-05 DIAGNOSIS — R200 Anesthesia of skin: Secondary | ICD-10-CM | POA: Diagnosis present

## 2017-07-05 DIAGNOSIS — E1010 Type 1 diabetes mellitus with ketoacidosis without coma: Secondary | ICD-10-CM | POA: Diagnosis present

## 2017-07-05 DIAGNOSIS — Z882 Allergy status to sulfonamides status: Secondary | ICD-10-CM | POA: Diagnosis not present

## 2017-07-05 DIAGNOSIS — F203 Undifferentiated schizophrenia: Secondary | ICD-10-CM

## 2017-07-05 DIAGNOSIS — D649 Anemia, unspecified: Secondary | ICD-10-CM | POA: Diagnosis present

## 2017-07-05 DIAGNOSIS — Y92009 Unspecified place in unspecified non-institutional (private) residence as the place of occurrence of the external cause: Secondary | ICD-10-CM | POA: Diagnosis not present

## 2017-07-05 DIAGNOSIS — Z9114 Patient's other noncompliance with medication regimen: Secondary | ICD-10-CM | POA: Diagnosis not present

## 2017-07-05 DIAGNOSIS — R0789 Other chest pain: Secondary | ICD-10-CM | POA: Diagnosis present

## 2017-07-05 DIAGNOSIS — E1065 Type 1 diabetes mellitus with hyperglycemia: Secondary | ICD-10-CM

## 2017-07-05 DIAGNOSIS — E860 Dehydration: Secondary | ICD-10-CM | POA: Diagnosis present

## 2017-07-05 DIAGNOSIS — N179 Acute kidney failure, unspecified: Secondary | ICD-10-CM | POA: Diagnosis not present

## 2017-07-05 DIAGNOSIS — K219 Gastro-esophageal reflux disease without esophagitis: Secondary | ICD-10-CM | POA: Diagnosis present

## 2017-07-05 DIAGNOSIS — E1069 Type 1 diabetes mellitus with other specified complication: Secondary | ICD-10-CM

## 2017-07-05 DIAGNOSIS — R778 Other specified abnormalities of plasma proteins: Secondary | ICD-10-CM | POA: Insufficient documentation

## 2017-07-05 DIAGNOSIS — F17210 Nicotine dependence, cigarettes, uncomplicated: Secondary | ICD-10-CM | POA: Diagnosis present

## 2017-07-05 DIAGNOSIS — F259 Schizoaffective disorder, unspecified: Secondary | ICD-10-CM | POA: Diagnosis present

## 2017-07-05 DIAGNOSIS — R748 Abnormal levels of other serum enzymes: Secondary | ICD-10-CM | POA: Diagnosis not present

## 2017-07-05 DIAGNOSIS — I10 Essential (primary) hypertension: Secondary | ICD-10-CM | POA: Diagnosis present

## 2017-07-05 DIAGNOSIS — Z833 Family history of diabetes mellitus: Secondary | ICD-10-CM | POA: Diagnosis not present

## 2017-07-05 DIAGNOSIS — E1042 Type 1 diabetes mellitus with diabetic polyneuropathy: Secondary | ICD-10-CM | POA: Diagnosis present

## 2017-07-05 LAB — BASIC METABOLIC PANEL
Anion gap: 17 — ABNORMAL HIGH (ref 5–15)
Anion gap: 7 (ref 5–15)
Anion gap: 5 (ref 5–15)
Anion gap: 6 (ref 5–15)
BUN: 13 mg/dL (ref 6–20)
BUN: 11 mg/dL (ref 6–20)
BUN: 8 mg/dL (ref 6–20)
BUN: 7 mg/dL (ref 6–20)
CHLORIDE: 112 mmol/L — AB (ref 101–111)
CHLORIDE: 110 mmol/L (ref 101–111)
CHLORIDE: 109 mmol/L (ref 101–111)
CO2: 14 mmol/L — ABNORMAL LOW (ref 22–32)
CO2: 22 mmol/L (ref 22–32)
CO2: 24 mmol/L (ref 22–32)
CO2: 25 mmol/L (ref 22–32)
CREATININE: 1.44 mg/dL — AB (ref 0.61–1.24)
CREATININE: 1.12 mg/dL (ref 0.61–1.24)
CREATININE: 0.87 mg/dL (ref 0.61–1.24)
CREATININE: 0.86 mg/dL (ref 0.61–1.24)
Calcium: 8 mg/dL — ABNORMAL LOW (ref 8.9–10.3)
Calcium: 8.3 mg/dL — ABNORMAL LOW (ref 8.9–10.3)
Calcium: 8.1 mg/dL — ABNORMAL LOW (ref 8.9–10.3)
Calcium: 8.3 mg/dL — ABNORMAL LOW (ref 8.9–10.3)
Chloride: 106 mmol/L (ref 101–111)
GFR calc Af Amer: >60 mL/min (ref >60)
GFR calc Af Amer: >60 mL/min (ref >60)
GFR calc Af Amer: >60 mL/min (ref >60)
GFR calc Af Amer: >60 mL/min (ref >60)
GFR calc non Af Amer: >60 mL/min (ref >60)
GFR calc non Af Amer: >60 mL/min (ref >60)
GFR calc non Af Amer: >60 mL/min (ref >60)
GLUCOSE: 190 mg/dL — AB (ref 65–99)
Glucose, Bld: 535 mg/dL (ref 65–99)
Glucose, Bld: 249 mg/dL — ABNORMAL HIGH (ref 65–99)
Glucose, Bld: 214 mg/dL — ABNORMAL HIGH (ref 65–99)
Potassium: 4.1 mmol/L (ref 3.5–5.1)
Potassium: 3.3 mmol/L — ABNORMAL LOW (ref 3.5–5.1)
Potassium: 3.3 mmol/L — ABNORMAL LOW (ref 3.5–5.1)
Potassium: 3.3 mmol/L — ABNORMAL LOW (ref 3.5–5.1)
SODIUM: 141 mmol/L (ref 135–145)
SODIUM: 140 mmol/L (ref 135–145)
Sodium: 137 mmol/L (ref 135–145)
Sodium: 139 mmol/L (ref 135–145)

## 2017-07-05 LAB — CBG MONITORING, ED
GLUCOSE-CAPILLARY: 513 mg/dL — AB (ref 65–99)
Glucose-Capillary: 463 mg/dL — ABNORMAL HIGH (ref 65–99)
Glucose-Capillary: 448 mg/dL — ABNORMAL HIGH (ref 65–99)
Glucose-Capillary: 369 mg/dL — ABNORMAL HIGH (ref 65–99)
Glucose-Capillary: 322 mg/dL — ABNORMAL HIGH (ref 65–99)
Glucose-Capillary: 261 mg/dL — ABNORMAL HIGH (ref 65–99)
Glucose-Capillary: 205 mg/dL — ABNORMAL HIGH (ref 65–99)
Glucose-Capillary: 184 mg/dL — ABNORMAL HIGH (ref 65–99)

## 2017-07-05 LAB — URINALYSIS, ROUTINE W REFLEX MICROSCOPIC
BACTERIA UA: NONE SEEN
BILIRUBIN URINE: NEGATIVE
Glucose, UA: >=500 mg/dL — AB
Hgb urine dipstick: NEGATIVE
Ketones, ur: 80 mg/dL — AB
Leukocytes, UA: NEGATIVE
NITRITE: NEGATIVE
PH: 6 (ref 5.0–8.0)
Protein, ur: NEGATIVE mg/dL
SQUAMOUS EPITHELIAL / LPF: NONE SEEN
Specific Gravity, Urine: 1.022 (ref 1.005–1.030)

## 2017-07-05 LAB — TROPONIN I
TROPONIN I: 0.11 ng/mL — AB (ref <0.03)
TROPONIN I: 0.12 ng/mL — AB (ref <0.03)
Troponin I: 0.14 ng/mL (ref <0.03)

## 2017-07-05 LAB — GLUCOSE, CAPILLARY
GLUCOSE-CAPILLARY: 164 mg/dL — AB (ref 65–99)
GLUCOSE-CAPILLARY: 199 mg/dL — AB (ref 65–99)
Glucose-Capillary: 210 mg/dL — ABNORMAL HIGH (ref 65–99)
Glucose-Capillary: 189 mg/dL — ABNORMAL HIGH (ref 65–99)
Glucose-Capillary: 175 mg/dL — ABNORMAL HIGH (ref 65–99)
Glucose-Capillary: 202 mg/dL — ABNORMAL HIGH (ref 65–99)

## 2017-07-05 LAB — RAPID URINE DRUG SCREEN, HOSP PERFORMED
AMPHETAMINES: NOT DETECTED
BARBITURATES: NOT DETECTED
BENZODIAZEPINES: NOT DETECTED
Cocaine: NOT DETECTED
Opiates: NOT DETECTED
TETRAHYDROCANNABINOL: NOT DETECTED

## 2017-07-05 MED ORDER — ASPIRIN 81 MG PO CHEW
324.0000 mg | CHEWABLE_TABLET | ORAL | Status: AC
  Administered 2017-07-05: 324 mg via ORAL
  Filled 2017-07-05: qty 4

## 2017-07-05 MED ORDER — LORAZEPAM 0.5 MG PO TABS: 0.5000 mg | ORAL_TABLET | Freq: Every day | ORAL | Status: DC | PRN

## 2017-07-05 MED ORDER — POTASSIUM CHLORIDE 20 MEQ/15ML (10%) PO SOLN
30.0000 meq | Freq: Once | ORAL | Status: DC
  Filled 2017-07-05: qty 30

## 2017-07-05 MED ORDER — TECHNETIUM TC 99M DIETHYLENETRIAME-PENTAACETIC ACID: 31.0000 | Freq: Once | INTRAVENOUS | Status: DC | PRN

## 2017-07-05 MED ORDER — METOCLOPRAMIDE HCL 10 MG PO TABS
10.0000 mg | ORAL_TABLET | Freq: Three times a day (TID) | ORAL | Status: DC
  Administered 2017-07-05 – 2017-07-06 (×5): 10 mg via ORAL
  Filled 2017-07-05 (×5): qty 1

## 2017-07-05 MED ORDER — TRAMADOL HCL 50 MG PO TABS
50.0000 mg | ORAL_TABLET | Freq: Four times a day (QID) | ORAL | Status: DC | PRN
  Administered 2017-07-05 – 2017-07-06 (×2): 100 mg via ORAL
  Filled 2017-07-05 (×2): qty 2

## 2017-07-05 MED ORDER — SODIUM CHLORIDE 0.9 % IV SOLN
INTRAVENOUS | Status: DC
  Administered 2017-07-05: 4 [IU]/h via INTRAVENOUS
  Filled 2017-07-05: qty 1

## 2017-07-05 MED ORDER — ENOXAPARIN SODIUM 40 MG/0.4ML ~~LOC~~ SOLN
40.0000 mg | SUBCUTANEOUS | Status: DC
  Administered 2017-07-05 – 2017-07-06 (×2): 40 mg via SUBCUTANEOUS
  Filled 2017-07-05 (×3): qty 0.4

## 2017-07-05 MED ORDER — ACETAMINOPHEN 325 MG PO TABS: 650.0000 mg | ORAL_TABLET | Freq: Four times a day (QID) | ORAL | Status: DC | PRN

## 2017-07-05 MED ORDER — HYDROCODONE-ACETAMINOPHEN 10-325 MG PO TABS
1.0000 | ORAL_TABLET | Freq: Once | ORAL | Status: AC
  Administered 2017-07-05: 1 via ORAL
  Filled 2017-07-05: qty 1

## 2017-07-05 MED ORDER — ONDANSETRON HCL 4 MG/2ML IJ SOLN: 4.0000 mg | Freq: Four times a day (QID) | INTRAMUSCULAR | Status: DC | PRN

## 2017-07-05 MED ORDER — ASPIRIN 300 MG RE SUPP: 300.0000 mg | RECTAL | Status: AC

## 2017-07-05 MED ORDER — ACETAMINOPHEN 325 MG PO TABS: 650.0000 mg | ORAL_TABLET | ORAL | Status: DC | PRN

## 2017-07-05 MED ORDER — INSULIN ASPART 100 UNIT/ML ~~LOC~~ SOLN
0.0000 [IU] | Freq: Three times a day (TID) | SUBCUTANEOUS | Status: DC
  Administered 2017-07-05: 5 [IU] via SUBCUTANEOUS
  Administered 2017-07-05: 3 [IU] via SUBCUTANEOUS
  Administered 2017-07-06 (×2): 11 [IU] via SUBCUTANEOUS
  Administered 2017-07-06: 2 [IU] via SUBCUTANEOUS

## 2017-07-05 MED ORDER — TECHNETIUM TO 99M ALBUMIN AGGREGATED
4.0000 | Freq: Once | INTRAVENOUS | Status: AC | PRN
  Administered 2017-07-05: 4 via INTRAVENOUS

## 2017-07-05 MED ORDER — TRAZODONE HCL 150 MG PO TABS
150.0000 mg | ORAL_TABLET | Freq: Every day | ORAL | Status: DC
  Administered 2017-07-05 (×2): 150 mg via ORAL
  Filled 2017-07-05: qty 3
  Filled 2017-07-05: qty 1

## 2017-07-05 MED ORDER — NITROGLYCERIN 0.4 MG SL SUBL
0.4000 mg | SUBLINGUAL_TABLET | SUBLINGUAL | Status: DC | PRN
  Administered 2017-07-05 (×3): 0.4 mg via SUBLINGUAL
  Filled 2017-07-05 (×3): qty 1

## 2017-07-05 MED ORDER — INSULIN ASPART 100 UNIT/ML ~~LOC~~ SOLN
0.0000 [IU] | Freq: Every day | SUBCUTANEOUS | Status: DC
  Administered 2017-07-05: 2 [IU] via SUBCUTANEOUS

## 2017-07-05 MED ORDER — PANTOPRAZOLE SODIUM 40 MG PO TBEC
40.0000 mg | DELAYED_RELEASE_TABLET | Freq: Every day | ORAL | Status: DC
  Administered 2017-07-05 – 2017-07-06 (×2): 40 mg via ORAL
  Filled 2017-07-05 (×2): qty 1

## 2017-07-05 MED ORDER — DEXTROSE-NACL 5-0.45 % IV SOLN
INTRAVENOUS | Status: DC
  Administered 2017-07-05: 07:00:00 via INTRAVENOUS

## 2017-07-05 MED ORDER — INSULIN DETEMIR 100 UNIT/ML ~~LOC~~ SOLN
20.0000 [IU] | Freq: Every day | SUBCUTANEOUS | Status: DC
  Administered 2017-07-05: 20 [IU] via SUBCUTANEOUS
  Filled 2017-07-05 (×3): qty 0.2

## 2017-07-05 MED ORDER — SODIUM CHLORIDE 0.9 % IV SOLN
INTRAVENOUS | Status: DC
  Administered 2017-07-05: 02:00:00 via INTRAVENOUS

## 2017-07-05 MED ORDER — ONDANSETRON HCL 4 MG/2ML IJ SOLN
4.0000 mg | Freq: Four times a day (QID) | INTRAMUSCULAR | Status: DC | PRN
  Administered 2017-07-05 – 2017-07-06 (×2): 4 mg via INTRAVENOUS
  Filled 2017-07-05 (×2): qty 2
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
## 2017-07-05 NOTE — ED Notes (Signed)
Pt requesting something to drink; inpatient MD paged.

## 2017-07-05 NOTE — ED Notes (Signed)
CBG [phone] 

## 2017-07-05 NOTE — ED Notes (Signed)
Pt transported to [facility] 

## 2017-07-05 NOTE — ED Notes (Signed)
NS fluids still running, will hang insulin when finished.

## 2017-07-05 NOTE — ED Notes (Signed)
Spoke with inpatient doctor; she will write a one-time dose for Norco for the pt's unresolved chest pain.

## 2017-07-05 NOTE — Progress Notes (Signed)
Pt arrived to 4E09. Dr. Rhona Leavens paged because patient complaining of no appetite x 5 days and is requesting for appetite stimulant. Awaiting call back.

## 2017-07-05 NOTE — ED Notes (Signed)
Dr. Antionette Char paged regarding pt requests.

## 2017-07-05 NOTE — Progress Notes (Signed)
Dr. Rhona Leavens paged regarding pt's request for his reglan which he states he takes 3 times a day. Awaiting call back.

## 2017-07-05 NOTE — Progress Notes (Signed)
PROGRESS NOTE    [name]  CHY:[id number] DOB: [birth year] DOA: 07/04/2017 PCP: Fleet Contras, MD    Brief Narrative:  31 y.o. male with medical history significant for insulin-dependent diabetes mellitus, schizophrenia, anemia, and history of substance abuse, now presenting to the emergency department for evaluation of left-sided chest pain and right hand numbness. Patient just left the hospital AMA yesterday after an admission for diabetic ketoacidosis with altered mental status after being found down by his roommate. Patient was improving yesterday when he left. He reports numbness involving the right hand, but not the upper arm, and without weakness, beginning around the time of his recent admission on 07/01/2017. Denies any associated headache or change in vision or hearing. Reports the development of left-sided chest pain earlier today, described as sharp, constant, localized, without alleviating or exacerbating factors, and unlike symptoms he had experienced previously. He denies any cough, palpitations, or shortness of breath associated with this. Denies any recent fevers or chills.  Assessment & Plan:   Principal Problem:   DKA, type 1, not at goal [facility]) Active Problems:   Undifferentiated schizophrenia ([name])   DM hyperosmolarity type I, uncontrolled ([name])   Substance abuse   Chest pain   GERD (gastroesophageal reflux disease)   Nausea & vomiting   Medically noncompliant   Acute kidney injury (nontraumatic) ([name])   Normocytic anemia   Numbness of right hand  1. DKA, type I DM  - Pt left hospital [facility] 7/17 as his DKA was resolving, now presents with mild DKA  - He is managed with Levemir 45 units qD and Novolog 0-15 units TID prior to admission - DKA resolved with IVF hydration. Patient transitioned off insulin gtt  2. Acute kidney injury  - SCr is 1.35 on admission, up from 0.6 earlier this month, but has improved from recent hospitalization  - Likely prerenal in  setting of DKA with osmotic diuresis and clinical dehydration  - resolved with IVF hydration  3. Chest pain, elevated troponin, elevated d-dimer  - Pt presents with chest pain, atypical description for ACS  - Troponin is elevated to 0.13 and d-dimer elevated to 1.58 on admission  - V/Q reviewed. Neg for DVT - Appears stable  4. Schizophrenia  - Previously followed with psychiatry  - Continue trazodone qHS as tolerated  5. Normocytic anemia  - Hgb is 11.1 on admission and stable with no bleeding evident  - Stable at present  6. 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 Decreased appetite, nausea - Abd mildly distended on exam - Will order abd xray to r/o obstruction/consitpation  DVT prophylaxis: lovenox subQ Code Status: Full Family Communication: Pt in room, family not at bedside Disposition Plan: Uncertain at this time  Consultants:     Procedures:     Antimicrobials: Anti-infectives    None       Subjective: Complains of decreased appeitite  Objective: Vitals:   07/05/17 0700 07/05/17 0730 07/05/17 0815 07/05/17 0837  BP: 107/65 114/73 116/81 126/82  Pulse: 99 96 93   Resp: 12 16 14    Temp:    98.5 F (36.9 C)  TempSrc:  SpO2: 97% 98% 100%   Weight:    84.2 kg (185 lb 9.6 oz)  Height:    6\' 1"  (1.854 m)    Intake/Output Summary (Last 24 hours) at 07/05/17 1540 Last data filed at 07/05/17 0151  Gross per 24 hour  Intake             1998 ml  Output                0 ml  Net             1998 ml   Filed Weights   07/04/17 2102 07/05/17 0837  Weight: 90.7 kg (200 lb) 84.2 kg (185 lb 9.6 oz)     Examination:  General exam: Appears calm and comfortable  Respiratory system: Clear to auscultation. Respiratory effort normal. Cardiovascular system: S1 & S2 heard, RRR. No JVD, murmurs, rubs, gallops or clicks. No pedal edema. Gastrointestinal system: mildly distended, soft and nontender. No organomegaly or masses felt. Normal bowel sounds heard. Central nervous system: Alert and oriented. No focal neurological deficits. Extremities: Symmetric 5 x 5 power. Skin: No rashes, lesions Psychiatry: Judgement and insight appear normal. Mood & affect appropriate.   Data Reviewed: I have personally reviewed following labs and imaging studies  CBC:  Recent Labs Lab 07/01/17 1019 07/01/17 1030 07/02/17 0310 07/03/17 0316 07/04/17 2102  WBC 55.9*  --  18.7* 8.4 5.1  NEUTROABS 43.5*  --   --  6.9  --   HGB 12.7* 13.9 10.8* 9.7* 11.1*  HCT 40.7 41.0 29.8* 27.4* 33.0*  MCV 92.7  --  79.9 78.7 84.0  PLT 525*  --  273 178 171   Basic Metabolic Panel:  Recent Labs Lab 07/01/17 1807  07/02/17 0310  07/02/17 1013 07/02/17 1510 07/03/17 0316 07/04/17 2102 07/05/17 0115 07/05/17 0628 07/05/17 1158 07/05/17 1422  NA  --   < > 146*  < > 144 146* 145 139 137 141 139 140  K  --   < > 3.0*  < > 3.3* 3.2* 2.9* 3.6 4.1 3.3* 3.3* 3.3*  CL  --   < > 116*  < > 117* 117* 115* 104 106 112* 110 109  CO2  --   < > 12*  < > 18* 21* 18* 17* 14* 22 24 25   GLUCOSE  --   < > 138*  < > 197* 169* 299* 418* 535* 249* 214* 190*  BUN  --   < > 44*  < > 35* 31* 21* 13 13 11 8 7   CREATININE  --   < > 2.19*  < > 1.71* 1.47* 1.30* 1.35* 1.44* 1.12 0.87 0.86  CALCIUM  --   < > 7.6*  < > 7.2* 7.5* 8.0* 9.0 8.0* 8.3* 8.1* 8.3*  MG  --   --  1.7  --  1.6* 1.8 1.9  --   --   --   --   --   PHOS 3.5  --  <1.0*  --  2.7 2.5 1.7*  --   --   --   --   --   < > = values in this interval not displayed. GFR: Estimated Creatinine Clearance: 139.4 mL/min (by C-G formula based on SCr of 0.86 mg/dL). Liver Function  Tests:  Recent Labs Lab 07/01/17 1019  AST 14*  ALT 19  ALKPHOS 158*  BILITOT 1.9*  PROT 6.0*  ALBUMIN 3.6   No results for input(s): LIPASE, AMYLASE in the last  168 hours. No results for input(s): AMMONIA in the last 168 hours. Coagulation Profile:  Recent Labs Lab 07/01/17 1807  INR 1.49   Cardiac Enzymes:  Recent Labs Lab 07/01/17 1019 07/01/17 1807 07/05/17 0115 07/05/17 0628 07/05/17 1158  CKTOTAL 209  --   --   --   --   TROPONINI  --  <0.03 0.11* 0.12* 0.14*   BNP (last 3 results) No results for input(s): PROBNP in the last 8760 hours. HbA1C: No results for input(s): HGBA1C in the last 72 hours. CBG:  Recent Labs Lab 07/05/17 0807 07/05/17 0940 07/05/17 1107 07/05/17 1213 07/05/17 1325  GLUCAP 184* 164* 210* 199* 189*   Lipid Profile: No results for input(s): CHOL, HDL, LDLCALC, TRIG, CHOLHDL, LDLDIRECT in the last 72 hours. Thyroid Function Tests: No results for input(s): TSH, T4TOTAL, FREET4, T3FREE, THYROIDAB in the last 72 hours. Anemia Panel: No results for input(s): VITAMINB12, FOLATE, FERRITIN, TIBC, IRON, RETICCTPCT in the last 72 hours. Sepsis Labs:  Recent Labs Lab 07/01/17 1309 07/01/17 1609 07/01/17 1807 07/01/17 2100 07/03/17 0939  PROCALCITON  --   --  5.22  --  1.50  LATICACIDVEN TEST REQUEST RECEIVED WITHOUT APPROPRIATE SPECIMEN TEST REQUEST RECEIVED WITHOUT APPROPRIATE SPECIMEN 1.6 1.6  --     Recent Results (from the past 240 hour(s))  Blood Culture (routine x 2)     Status: None (Preliminary result)   Collection Time: 07/01/17 11:50 AM  Result Value Ref Range Status   Specimen Description BLOOD LEFT HAND  Final   Special Requests IN PEDIATRIC BOTTLE Blood Culture adequate volume  Final   Culture NO GROWTH 4 DAYS  Final   Report Status PENDING  Incomplete  Blood Culture (routine x 2)     Status: None (Preliminary result)   Collection Time: 07/01/17 11:55 AM  Result Value Ref Range Status   Specimen Description  BLOOD RIGHT WRIST  Final   Special Requests IN PEDIATRIC BOTTLE Blood Culture adequate volume  Final   Culture NO GROWTH 4 DAYS  Final   Report Status PENDING  Incomplete  Culture, Urine     Status: None   Collection Time: 07/01/17 12:42 PM  Result Value Ref Range Status   Specimen Description URINE, RANDOM  Final   Special Requests NONE  Final   Culture NO GROWTH  Final   Report Status 07/02/2017 FINAL  Final  MRSA PCR Screening     Status: None   Collection Time: 07/01/17  4:30 PM  Result Value Ref Range Status   MRSA by PCR NEGATIVE NEGATIVE Final    Comment:        The GeneXpert MRSA Assay (FDA approved for NASAL specimens only), is one component of a comprehensive MRSA colonization surveillance program. It is not intended to diagnose MRSA infection nor to guide or monitor treatment for MRSA infections.      Radiology Studies: Dg Chest 2 View  Result Date: 07/04/2017 CLINICAL DATA:  Chest pain EXAM: CHEST  2 VIEW COMPARISON:  Chest radiograph 07/01/2017 FINDINGS: The heart size and mediastinal contours are within normal limits. Both lungs are clear. The visualized skeletal structures are unremarkable. IMPRESSION: No active cardiopulmonary disease. Electronically Signed   By: Deatra Robinson M.D.   On: 07/04/2017 21:55   Ct Head Wo Contrast  Result Date: 07/04/2017 CLINICAL DATA:  Right hand numbness, polysubstance abuse. EXAM: CT HEAD WITHOUT CONTRAST TECHNIQUE: Contiguous axial images were obtained from the base of the skull through the vertex without intravenous contrast. COMPARISON:  08/19/2011 FINDINGS: Brain: No evidence of acute infarction, hemorrhage, hydrocephalus, extra-axial collection or mass lesion/mass effect. Vascular: No hyperdense vessel or unexpected calcification. Skull: Normal. Negative for fracture or focal lesion. Sinuses/Orbits: No acute finding. Other: None IMPRESSION: No acute intracranial abnormality or significant change. Electronically Signed   By:  Tollie Eth M.D.   On: 07/04/2017 23:43   Nm Pulmonary Perf And Vent  Result Date: 07/05/2017 CLINICAL DATA:  31 year old male with left-sided chest pain. Rule out PE. EXAM: NUCLEAR MEDICINE VENTILATION - PERFUSION LUNG SCAN TECHNIQUE: Ventilation images were obtained in multiple projections using inhaled aerosol Tc-60m DTPA. Perfusion images were obtained in multiple projections after intravenous injection of Tc-41m MAA. RADIOPHARMACEUTICALS:  32 mCi Technetium-72m DTPA aerosol inhalation and 4.3 mCi Technetium-97m MAA IV COMPARISON:  Chest radiograph dated 07/04/2017 FINDINGS: Ventilation: No focal ventilation defect. Perfusion: No wedge shaped peripheral perfusion defects to suggest acute pulmonary embolism. IMPRESSION: Normal study. Electronically Signed   By: Elgie Collard M.D.   On: 07/05/2017 03:44    Scheduled Meds: . acetaminophen  650 mg Oral Once  . enoxaparin (LOVENOX) injection  40 mg Subcutaneous Q24H  . insulin aspart  0-15 Units Subcutaneous TID WC  . insulin aspart  0-5 Units Subcutaneous QHS  . insulin detemir  20 Units Subcutaneous Daily  . metoCLOPramide  10 mg Oral TID WC  . pantoprazole  40 mg Oral Daily  . potassium chloride  30 mEq Oral Once  . traZODone  150 mg Oral QHS   Continuous Infusions: . insulin (NOVOLIN-R) infusion Stopped (07/05/17 1326)     LOS: 0 days   CHIU, Scheryl Marten, MD [facility] Pager [phone]  If 7PM-7AM, please contact night-coverage [email or web address] Password [email or web address] 07/05/2017, 3:40 PM

## 2017-07-05 NOTE — ED Notes (Signed)
Pt refusing liquid potassium at this time. Pt requesting he take the ordered potassium with apple juice. This nurse explained that the pt CBG was 513 and by consuming apple juice his sugar would increase significantly. This nurse offered to dilute liquid potassium with apple juice based on medication administration instructions. Pt continued to refuse and said, "I'll just wait for the doctor".

## 2017-07-05 NOTE — ED Notes (Addendum)
Pt wanted something to drink.  Inpt MD paged.  Pt's scans clear.  Pt allowed non-caloric fluids for now.  Pt given Sprite Zero.

## 2017-07-05 NOTE — ED Notes (Signed)
Per Dr. Criss Alvine, pt to receive 2L bolus before beginning insulin drip.

## 2017-07-05 NOTE — H&P (Signed)
History and Physical    [name] JKK:[id number] DOB: 03/26/1986 DOA: 07/04/2017  PCP: Nolene Ebbs, MD   Patient coming from: Home  Chief Complaint: Chest pain, right hand numbness  HPI: [name] is a 31 y.o. male with medical history significant for insulin-dependent diabetes mellitus, schizophrenia, anemia, and history of substance abuse, now presenting to the emergency department for evaluation of left-sided chest pain and right hand numbness. Patient just left the hospital AMA yesterday after an admission for diabetic ketoacidosis with altered mental status after being found down by his roommate. Patient was improving yesterday when he left. He reports numbness involving the right hand, but not the upper arm, and without weakness, beginning around the time of his recent admission on 07/01/2017. Denies any associated headache or change in vision or hearing. Reports the development of left-sided chest pain earlier today, described as sharp, constant, localized, without alleviating or exacerbating factors, and unlike symptoms he had experienced previously. He denies any cough, palpitations, or shortness of breath associated with this. Denies any recent fevers or chills.  ED Course: Upon arrival to the ED, patient is found to be afebrile, saturating well on room air, tachycardic in the 110s, and with vitals otherwise stable. EKG features a sinus tachycardia with rate 117 and chest x-ray is negative for acute pulmonary disease. Noncontrast head CT is negative for acute intracranial abnormality. Chemistry panel reveals a bicarbonate of 17, serum creatinine 1.35, up from an apparent baseline of 0.6, and a serum glucose of 418. CBC is notable for a stable normocytic anemia with hemoglobin of 11.1. Troponin is elevated to a value of 0.13 and d-dimer is elevated to 1.58. Patient was given 2 L normal saline, acetaminophen, Zofran, 40 mEq oral potassium, was started on an insulin infusion,  and has VQ scan ordered but not yet performed. Patient has remained hemodynamically stable in the ED and in no apparent respiratory distress, and will be admitted to the stepdown unit for ongoing evaluation and management of chest pain with elevated troponin and d-dimer, as well as mild DKA and a type I diabetic.  Review of Systems:  All other systems reviewed and apart from HPI, are negative.  Past Medical History:  Diagnosis Date  . Anemia   . Anxiety   . Arthritis    "both hips; both shoulders" (06/06/2017)  . Chicken pox   . Childhood asthma   . Chronic pain   . Depression   . DKA (diabetic ketoacidoses) ([place]) 11/18/2014  . GERD (gastroesophageal reflux disease)   . Hypertension   . Migraine    "a few/year" (06/06/2017)  . Noncompliance with medication regimen   . Pneumonia    "several times" (06/06/2017)  . Polysubstance abuse   . Schizo affective schizophrenia ([place])   . Scoliosis   . Type I diabetes mellitus ([place])     Past Surgical History:  Procedure Laterality Date  . CARDIAC CATHETERIZATION  11/2016  . INCISION AND DRAINAGE ABSCESS Left 11/2011   "MRSA removed off my thumb"     reports that he has been smoking Cigarettes.  He has a 4.50 pack-year smoking history. He has never used smokeless tobacco. He reports that he uses drugs, including Marijuana, Cocaine, Methamphetamines, and "Crack" cocaine. He reports that he does not drink alcohol.  Allergies  Allergen Reactions  . Sulfa Antibiotics Other (See Comments)    Unknown childhood allergy    Family History  Problem Relation Age of Onset  . Diabetes Mother  Prior to Admission medications   Medication Sig Start Date End Date Taking? Authorizing Provider  amphetamine-dextroamphetamine (ADDERALL) 10 MG tablet Take 30 mg by mouth daily with breakfast.   Yes [provider]  blood glucose meter kit and supplies KIT Dispense based on patient and insurance preference. Use up to four times daily as  directed. (FOR ICD-9 250.00, 250.01). 06/18/17  Yes Amin, Ankit Chirag, MD  glucose blood test strip Use as instructed 06/18/17  Yes Amin, Ankit Chirag, MD  insulin aspart (NOVOLOG FLEXPEN) 100 UNIT/ML FlexPen CBG 70 - 120: 0 units CBG 121 - 150: 2 units CBG 151 - 200: 3 units CBG 201 - 250: 5 units CBG 251 - 300: 8 units CBG 301 - 350: 11 units CBG 351 - 400: 15 units Patient taking differently: Inject 10 Units into the skin 3 (three) times daily with meals. CBG 70 - 120: 0 units CBG 121 - 150: 2 units CBG 151 - 200: 3 units CBG 201 - 250: 5 units CBG 251 - 300: 8 units CBG 301 - 350: 11 units CBG 351 - 400: 15 units 06/11/17  Yes Hosie Poisson, MD  Insulin Detemir (LEVEMIR FLEXPEN) 100 UNIT/ML Pen Inject 20 Units into the skin daily at 10 pm. Patient taking differently: Inject 45 Units into the skin daily at 10 pm.  06/18/17  Yes Amin, Ankit Chirag, MD  Insulin Pen Needle (PEN NEEDLES) 31G X 5 MM MISC 90 each by Does not apply route 4 (four) times daily -  before meals and at bedtime. 06/11/17  Yes Hosie Poisson, MD  Lancets (ACCU-CHEK MULTICLIX) lancets Use as directed 06/18/17  Yes Amin, Ankit Chirag, MD  LORazepam (ATIVAN PO) Take 1 tablet by mouth daily as needed (anxity).   Yes [provider]  metoCLOPramide (REGLAN) 10 MG tablet Take 1 tablet (10 mg total) by mouth 3 (three) times daily with meals. 06/11/17 06/11/18 Yes Hosie Poisson, MD  ondansetron (ZOFRAN ODT) 4 MG disintegrating tablet Take 1 tablet (4 mg total) by mouth every 8 (eight) hours as needed for nausea or vomiting. 06/19/17  Yes Amin, Ankit Chirag, MD  ondansetron (ZOFRAN) 4 MG tablet Take 1 tablet (4 mg total) by mouth every 8 (eight) hours as needed for nausea or vomiting. 06/11/17 06/11/18 Yes Hosie Poisson, MD  pantoprazole (PROTONIX) 40 MG tablet Take 1 tablet (40 mg total) by mouth daily. 06/10/17  Yes Hosie Poisson, MD  traZODone (DESYREL) 150 MG tablet Take 150 mg by mouth at bedtime.    Yes [provider]      Physical Exam: Vitals:   07/04/17 2200 07/04/17 2215 07/04/17 2230 07/04/17 2245  BP: 130/90 (!) 129/92 (!) 131/92 127/83  Pulse: (!) 111 (!) 110 (!) 107 (!) 108  Resp: 19 13 (!) 21 19  Temp:      TempSrc:      SpO2: 100% 100% 100% 100%  Weight:      Height:          Constitutional: No acute distress, no pallor, no diaphoresis. Disheveled.  Eyes: PERTLA, lids and conjunctivae normal ENMT: Mucous membranes are moist. Posterior pharynx clear of any exudate or lesions.   Neck: normal, supple, no masses, no thyromegaly Respiratory: clear to auscultation bilaterally, no wheezing, no crackles. Normal respiratory effort.   Cardiovascular: Rate ~110 and regular. No extremity edema. No significant JVD. Abdomen: No distension, no tenderness, no masses palpated. Bowel sounds active.  Musculoskeletal: no clubbing / cyanosis. No joint deformity upper and lower  extremities.  Skin: no significant rashes, lesions, ulcers. Warm, dry, well-perfused. Poor turgor.  Neurologic: CN 2-12 grossly intact. Sensation to light touch is diminished in the right hand. Patellar DTR's normal. Strength 5/5 in all 4 limbs.  Psychiatric: Alert and oriented x 3. Poor cooperation, perseverating over narcotic analgesia.     Labs on Admission: I have personally reviewed following labs and imaging studies  CBC:  Recent Labs Lab 07/01/17 1019 07/01/17 1030 07/02/17 0310 07/03/17 0316 07/04/17 2102  WBC 55.9*  --  18.7* 8.4 5.1  NEUTROABS 43.5*  --   --  6.9  --   HGB 12.7* 13.9 10.8* 9.7* 11.1*  HCT 40.7 41.0 29.8* 27.4* 33.0*  MCV 92.7  --  79.9 78.7 84.0  PLT 525*  --  273 178 546   Basic Metabolic Panel:  Recent Labs Lab 07/01/17 1807  07/02/17 0310 07/02/17 0749 07/02/17 1013 07/02/17 1510 07/03/17 0316 07/04/17 2102  NA  --   < > 146* 144 144 146* 145 139  K  --   < > 3.0* 3.4* 3.3* 3.2* 2.9* 3.6  CL  --   < > 116* 115* 117* 117* 115* 104  CO2  --   < > 12* 15* 18* 21* 18* 17*  GLUCOSE   --   < > 138* 222* 197* 169* 299* 418*  BUN  --   < > 44* 39* 35* 31* 21* 13  CREATININE  --   < > 2.19* 1.91* 1.71* 1.47* 1.30* 1.35*  CALCIUM  --   < > 7.6* 7.3* 7.2* 7.5* 8.0* 9.0  MG  --   --  1.7  --  1.6* 1.8 1.9  --   PHOS 3.5  --  <1.0*  --  2.7 2.5 1.7*  --   < > = values in this interval not displayed. GFR: Estimated Creatinine Clearance: 88.8 mL/min (A) (by C-G formula based on SCr of 1.35 mg/dL (H)). Liver Function Tests:  Recent Labs Lab 07/01/17 1019  AST 14*  ALT 19  ALKPHOS 158*  BILITOT 1.9*  PROT 6.0*  ALBUMIN 3.6   No results for input(s): LIPASE, AMYLASE in the last 168 hours. No results for input(s): AMMONIA in the last 168 hours. Coagulation Profile:  Recent Labs Lab 07/01/17 1807  INR 1.49   Cardiac Enzymes:  Recent Labs Lab 07/01/17 1019 07/01/17 1807  CKTOTAL 209  --   TROPONINI  --  <0.03   BNP (last 3 results) No results for input(s): PROBNP in the last 8760 hours. HbA1C: No results for input(s): HGBA1C in the last 72 hours. CBG:  Recent Labs Lab 07/03/17 0103 07/03/17 0107 07/03/17 0740 07/03/17 1149 07/03/17 1543  GLUCAP 321* 291* 228* 294* 239*   Lipid Profile: No results for input(s): CHOL, HDL, LDLCALC, TRIG, CHOLHDL, LDLDIRECT in the last 72 hours. Thyroid Function Tests: No results for input(s): TSH, T4TOTAL, FREET4, T3FREE, THYROIDAB in the last 72 hours. Anemia Panel: No results for input(s): VITAMINB12, FOLATE, FERRITIN, TIBC, IRON, RETICCTPCT in the last 72 hours. Urine analysis:    Component Value Date/Time   COLORURINE YELLOW 07/01/2017 1242   APPEARANCEUR HAZY (A) 07/01/2017 1242   LABSPEC 1.015 07/01/2017 1242   PHURINE 5.0 07/01/2017 1242   GLUCOSEU >=500 (A) 07/01/2017 1242   HGBUR MODERATE (A) 07/01/2017 1242   BILIRUBINUR NEGATIVE 07/01/2017 1242   BILIRUBINUR negative 03/29/2015 1114   KETONESUR 20 (A) 07/01/2017 1242   PROTEINUR 100 (A) 07/01/2017 1242   UROBILINOGEN 0.2 05/17/2015  [place] 07/01/2017 [place] 07/01/2017 1242   Sepsis Labs: '@LABRCNTIP'$ (procalcitonin:4,lacticidven:4) ) Recent Results (from the past 240 hour(s))  Blood Culture (routine x 2)     Status: None (Preliminary result)   Collection Time: 07/01/17 11:50 AM  Result Value Ref Range Status   Specimen Description BLOOD LEFT HAND  Final   Special Requests IN PEDIATRIC BOTTLE Blood Culture adequate volume  Final   Culture NO GROWTH 3 DAYS  Final   Report Status PENDING  Incomplete  Blood Culture (routine x 2)     Status: None (Preliminary result)   Collection Time: 07/01/17 11:55 AM  Result Value Ref Range Status   Specimen Description BLOOD RIGHT WRIST  Final   Special Requests IN PEDIATRIC BOTTLE Blood Culture adequate volume  Final   Culture NO GROWTH 3 DAYS  Final   Report Status PENDING  Incomplete  Culture, Urine     Status: None   Collection Time: 07/01/17 12:42 PM  Result Value Ref Range Status   Specimen Description URINE, RANDOM  Final   Special Requests NONE  Final   Culture NO GROWTH  Final   Report Status 07/02/2017 FINAL  Final  MRSA PCR Screening     Status: None   Collection Time: 07/01/17  4:30 PM  Result Value Ref Range Status   MRSA by PCR NEGATIVE NEGATIVE Final    Comment:        The GeneXpert MRSA Assay (FDA approved for NASAL specimens only), is one component of a comprehensive MRSA colonization surveillance program. It is not intended to diagnose MRSA infection nor to guide or monitor treatment for MRSA infections.      Radiological Exams on Admission: Dg Chest 2 View  Result Date: 07/04/2017 CLINICAL DATA:  Chest pain EXAM: CHEST  2 VIEW COMPARISON:  Chest radiograph 07/01/2017 FINDINGS: The heart size and mediastinal contours are within normal limits. Both lungs are clear. The visualized skeletal structures are unremarkable. IMPRESSION: No active cardiopulmonary disease. Electronically Signed   By: Ulyses Jarred M.D.   On:  07/04/2017 21:55   Ct Head Wo Contrast  Result Date: 07/04/2017 CLINICAL DATA:  Right hand numbness, polysubstance abuse. EXAM: CT HEAD WITHOUT CONTRAST TECHNIQUE: Contiguous axial images were obtained from the base of the skull through the vertex without intravenous contrast. COMPARISON:  08/19/2011 FINDINGS: Brain: No evidence of acute infarction, hemorrhage, hydrocephalus, extra-axial collection or mass lesion/mass effect. Vascular: No hyperdense vessel or unexpected calcification. Skull: Normal. Negative for fracture or focal lesion. Sinuses/Orbits: No acute finding. Other: None IMPRESSION: No acute intracranial abnormality or significant change. Electronically Signed   By: Ashley Royalty M.D.   On: 07/04/2017 23:43    EKG: Independently reviewed. Sinus tachycardia (rate 117).   Assessment/Plan  1. DKA, type I DM  - Pt left hospital [facility] 7/17 as his DKA was resolving, now presents with mild DKA  - He is managed with Levemir 45 units qD and Novolog 0-15 units TID at home - Given 2 liters NS in ED and started on insulin infusion  - Plan to continue NS infusion and insulin infusion with frequent CBG's and serial chemistry panels until DKA has resolved   2. Acute kidney injury  - SCr is 1.35 on admission, up from 0.6 earlier this month, but has improved from recent hospitalization  - Likely prerenal in setting of DKA with osmotic diuresis and clinical dehydration  - Treated with 2 liters NS in ED and continued  on NS infusion  - Following serial chem panels as above; avoid nephrotoxic agents where feasible    3. Chest pain, elevated troponin, elevated d-dimer  - Pt presents with chest pain, atypical description for ACS  - Troponin is elevated to 0.13 and d-dimer elevated to 1.58 on admission  - Plan to treat with ASA 324, monitor on telemetry, follow-up pending V/Q scan, trend troponin measurements, and repeat EKG   4. Schizophrenia  - Previously followed with psychiatry  - Continue  trazodone qHS   5. Normocytic anemia  - Hgb is 11.1 on admission and stable with no bleeding evident  - Likely secondary to chronic disease   6. 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

## 2017-07-05 NOTE — ED Notes (Signed)
Per main lab, will add on BMP to 0115 troponin.

## 2017-07-05 NOTE — ED Notes (Addendum)
Pt refused potassium pills. Pt states "I can't take those pills, I need the liquid." This nurse offered to split pills in half. Pt states "No. I can't take them." When this nurse inquired as to how pt able to swallow other pills including pain medication he is requesting pt states "I'm not going to argue with you. Get the liquid." Pt then proceeded to ask this nurse for more water, when this nurse told pt he had an NPO order pt laughs and states "then why was I getting water for meds, you make no sense." This nurse explained to pt that the order allowed for sips with meds. Pt states "get me the doctor."

## 2017-07-05 NOTE — ED Notes (Signed)
Pt asking for food.  This RN counseled patient on why he was NPO on an insulin drip.  Pt verbalized understanding.  Offered pt another diet soda; he declined.

## 2017-07-05 NOTE — Progress Notes (Signed)
Insulin drip discontinued per order. Will continue to monitor patient's blood sugars ACHS.

## 2017-07-05 NOTE — Progress Notes (Signed)
Dr. Rhona Leavens paged regarding pt requesting opinion of a second MD. Awaiting call back.

## 2017-07-05 NOTE — ED Notes (Signed)
Admitting at bedside 

## 2017-07-06 ENCOUNTER — Inpatient Hospital Stay (HOSPITAL_COMMUNITY): Payer: Medicare Other

## 2017-07-06 DIAGNOSIS — R200 Anesthesia of skin: Secondary | ICD-10-CM

## 2017-07-06 LAB — CULTURE, BLOOD (ROUTINE X 2)
CULTURE: NO GROWTH
Culture: NO GROWTH
SPECIAL REQUESTS: ADEQUATE
SPECIAL REQUESTS: ADEQUATE

## 2017-07-06 LAB — BASIC METABOLIC PANEL
Anion gap: 10 (ref 5–15)
BUN: 7 mg/dL (ref 6–20)
CHLORIDE: 106 mmol/L (ref 101–111)
CO2: 26 mmol/L (ref 22–32)
CREATININE: 0.9 mg/dL (ref 0.61–1.24)
Calcium: 8.9 mg/dL (ref 8.9–10.3)
GFR calc Af Amer: >60 mL/min (ref >60)
GLUCOSE: 265 mg/dL — AB (ref 65–99)
Potassium: 3.3 mmol/L — ABNORMAL LOW (ref 3.5–5.1)
SODIUM: 142 mmol/L (ref 135–145)

## 2017-07-06 LAB — GLUCOSE, CAPILLARY
GLUCOSE-CAPILLARY: 316 mg/dL — AB (ref 65–99)
GLUCOSE-CAPILLARY: 324 mg/dL — AB (ref 65–99)
GLUCOSE-CAPILLARY: 147 mg/dL — AB (ref 65–99)

## 2017-07-06 LAB — TROPONIN I: Troponin I: 0.05 ng/mL (ref <0.03)

## 2017-07-06 MED ORDER — TRAMADOL HCL 50 MG PO TABS: 50.0000 mg | ORAL_TABLET | Freq: Four times a day (QID) | ORAL | 0 refills | Status: DC | PRN

## 2017-07-06 MED ORDER — INSULIN DETEMIR 100 UNIT/ML ~~LOC~~ SOLN
35.0000 [IU] | Freq: Every day | SUBCUTANEOUS | Status: DC
  Filled 2017-07-06: qty 0.35

## 2017-07-06 MED ORDER — POTASSIUM CHLORIDE CRYS ER 20 MEQ PO TBCR
40.0000 meq | EXTENDED_RELEASE_TABLET | Freq: Once | ORAL | Status: DC
  Filled 2017-07-06: qty 2

## 2017-07-06 MED ORDER — INSULIN DETEMIR 100 UNIT/ML ~~LOC~~ SOLN
40.0000 [IU] | Freq: Every day | SUBCUTANEOUS | Status: DC
  Administered 2017-07-06: 40 [IU] via SUBCUTANEOUS
  Filled 2017-07-06: qty 0.4

## 2017-07-06 MED ORDER — INSULIN ASPART 100 UNIT/ML ~~LOC~~ SOLN
5.0000 [IU] | Freq: Three times a day (TID) | SUBCUTANEOUS | Status: DC
  Administered 2017-07-06 (×2): 5 [IU] via SUBCUTANEOUS

## 2017-07-06 MED ORDER — GLUCERNA SHAKE PO LIQD
237.0000 mL | Freq: Three times a day (TID) | ORAL | Status: DC
  Administered 2017-07-06 (×2): 237 mL via ORAL

## 2017-07-06 MED ORDER — POTASSIUM CHLORIDE 20 MEQ/15ML (10%) PO SOLN
40.0000 meq | Freq: Once | ORAL | Status: AC
  Administered 2017-07-06: 40 meq via ORAL
  Filled 2017-07-06: qty 30

## 2017-07-06 MED ORDER — INSULIN DETEMIR 100 UNIT/ML FLEXPEN: 45.0000 [IU] | PEN_INJECTOR | Freq: Every day | SUBCUTANEOUS | Status: DC

## 2017-07-06 MED ORDER — INSULIN DETEMIR 100 UNIT/ML FLEXPEN: 45.0000 [IU] | PEN_INJECTOR | SUBCUTANEOUS | Status: DC

## 2017-07-06 NOTE — Consult Note (Signed)
WOC Nurse wound consult note Reason for Consult: Consult requested for left heel.  Pt was found down at home for unknown period of time. Wound type: Stage 2 fluid filled blister with intact skin Pressure Injury POA: Yes Measurement: 5X10cm Drainage (amount, consistency, odor) No open wound or drainage Dressing procedure/placement/frequency: Float heel to reduce pressure.  Foam dressing to protect from further injury.  It is best practice to leave intact blister in place.  Discussed plan of care with patient and they verbalized understanding. Please re-consult if further assistance is needed.  Thank-you,  Cammie Mcgee MSN, RN, CWOCN, [place], CNS [phone]

## 2017-07-06 NOTE — Progress Notes (Signed)
Pt continues to refuse cardiac monitoring. MD aware.

## 2017-07-06 NOTE — Progress Notes (Signed)
Inpatient Diabetes Program Recommendations  AACE/ADA: New Consensus Statement on Inpatient Glycemic Control (2015)  Target Ranges:  Prepandial:   less than 140 mg/dL      Peak postprandial:   less than 180 mg/dL (1-2 hours)      Critically ill patients:  140 - 180 mg/dL   Lab Results  Component Value Date   GLUCAP 324 (H) 07/06/2017   HGBA1C 10.8 (H) 06/16/2017    Review of Glycemic Control:  Results for [name], [name] (MRN [id number]) as of 07/06/2017 11:13  Ref. Range 07/05/2017 13:25 07/05/2017 16:11 07/05/2017 20:49 07/06/2017 07:05 07/06/2017 11:06  Glucose-Capillary Latest Ref Range: 65 - 99 mg/dL 469 (H) 629 (H) 528 (H) 316 (H) 324 (H)    Diabetes history: DKA type 1 Outpatient Diabetes medications: Novolog flexpen-10 units tid with meals, Levemir 45 units daily Current orders for Inpatient glycemic control:  Novolog moderate tid with meals and HS, Levemir 40 units daily  Inpatient Diabetes Program Recommendations:   Consider adding Novolog 5 units tid with meals-Hold if patient eats less than 50%.    Thanks, Beryl Meager, RN, BC-ADM Inpatient Diabetes Coordinator Pager [phone] (8a-5p)

## 2017-07-06 NOTE — Progress Notes (Signed)
Pt being non complaint with care is refusing to let NA or myself fix his sheets and is refusing morning labs spoke with pt about need and importance of drawing labs he said quote "im not doing it"

## 2017-07-06 NOTE — Discharge Summary (Signed)
Physician Discharge Summary  [name] XLK:[id number] DOB: March 28, 1986 DOA: 07/04/2017  PCP: Nolene Ebbs, MD  Admit date: 07/04/2017 Discharge date: 07/06/2017  Admitted From: Home Disposition:  Home  Recommendations for Outpatient Follow-up:  1. Follow up with PCP in 1-2 weeks 2. Follow up with Orthopedic Surgery. Patient will be called to schedule appointment  Tye reviewed. 30 day supply of ADHD meds dispensed 06/26/17 and 3 day supply (10 tabs) dispensed on 06/11/17  Discharge Condition:Improved CODE STATUS:Full Diet recommendation: Diabetic   Brief/Interim Summary: 31 y.o.malewith medical history significant for insulin-dependent diabetes mellitus, schizophrenia, anemia, and history of substance abuse, now presenting to the emergency department for evaluation of left-sided chest pain and right hand numbness. Patient just left the hospital AMA yesterday after an admission for diabetic ketoacidosis with altered mental status after being found down by his roommate. Patient was improving yesterday when he left. He reports numbness involving the right hand, but not the upper arm, and without weakness, beginning around the time of his recent admission on 07/01/2017. Denies any associated headache or change in vision or hearing. Reports the development of left-sided chest pain earlier today, described as sharp, constant, localized, without alleviating or exacerbating factors, and unlike symptoms he had experienced previously. He denies any cough, palpitations, or shortness of breath associated with this. Denies any recent fevers or chills.  1. DKA, type I DM  - Pt left hospital [facility] 7/17 as his DKA was resolving, now presents with mild DKA  - He is managed with Levemir 45 units qD and Novolog 0-15 units TID prior to admission - DKA resolved with IVF hydration. Patient transitioned off insulin gtt  - Glucose at time of discharge 147  2. Acute kidney injury  - SCr is 1.35 on  admission, up from 0.6 earlier this month, but has improved from recent hospitalization  - Likely prerenal in setting of DKA with osmotic diuresis and clinical dehydration  - resolved with IVF hydration  3. Chest pain, elevated troponin, elevated d-dimer  - Pt presents with chest pain, atypical description for ACS  - Troponin is elevated to 0.13 and d-dimer elevated to 1.58 on admission  - V/Q reviewed. Neg for DVT - Appears stable  4. Schizophrenia  - Previously followed with psychiatry  - Continue trazodone qHS as tolerated  5. Normocytic anemia  - Hgb is 11.1 on admission and stable with no bleeding evident  - Stable at present  6. Right hand numbness  - Pt reports right hand numbness since time of recent admission - Head CT is negative and no other focal neurologic deficits identified  - Patient complained of R shoulder pain with limited ROM. Obtained R shoulder xray, R hand xray. Reviewed with no acute process, no fracture. - Question radicular symptoms. Will refer to Orthopedic Surgery. [employer], stated patient will be contacted to schedule an outpatient appointment - Motor function intact, good pulses in right wrist  7. Decreased appetite, nausea - Abd mildly distended on exam - Will order abd xray to r/o obstruction/consitpation  8. Fluid filled L heel blister -Consulted WOC with recommendations to leave blister in place and to keep pressure off area -Per above, patient has been referred to Orthopedic Surgery who will contact patient on discharge  Discharge Diagnoses:  Principal Problem:   DKA, type 1, not at goal [facility]) Active Problems:   Undifferentiated schizophrenia ([place])   DM hyperosmolarity type I, uncontrolled ([place])   Substance abuse   Chest pain   GERD (  gastroesophageal reflux disease)   Nausea & vomiting   Medically noncompliant   Acute kidney injury (nontraumatic) ([name])   Normocytic anemia   Numbness of right hand   Discharge  Instructions  Allergies as of 07/06/2017      Reactions   Sulfa Antibiotics Other (See Comments)   Unknown childhood allergy      Medication List    TAKE these medications   accu-chek multiclix lancets Use as directed   ADDERALL 10 MG tablet Generic drug:  amphetamine-dextroamphetamine Take 30 mg by mouth daily with breakfast.   ATIVAN PO Take 1 tablet by mouth daily as needed (anxity).   blood glucose meter kit and supplies Kit Dispense based on patient and insurance preference. Use up to four times daily as directed. (FOR ICD-9 250.00, 250.01).   glucose blood test strip Use as instructed   insulin aspart 100 UNIT/ML FlexPen Commonly known as:  NOVOLOG FLEXPEN CBG 70 - 120: 0 units CBG 121 - 150: 2 units CBG 151 - 200: 3 units CBG 201 - 250: 5 units CBG 251 - 300: 8 units CBG 301 - 350: 11 units CBG 351 - 400: 15 units What changed:  how much to take  how to take this  when to take this  additional instructions   Insulin Detemir 100 UNIT/ML Pen Commonly known as:  LEVEMIR FLEXPEN Inject 45 Units into the skin daily. What changed:  how much to take  when to take this   metoCLOPramide 10 MG tablet Commonly known as:  REGLAN Take 1 tablet (10 mg total) by mouth 3 (three) times daily with meals.   ondansetron 4 MG disintegrating tablet Commonly known as:  ZOFRAN ODT Take 1 tablet (4 mg total) by mouth every 8 (eight) hours as needed for nausea or vomiting.   ondansetron 4 MG tablet Commonly known as:  ZOFRAN Take 1 tablet (4 mg total) by mouth every 8 (eight) hours as needed for nausea or vomiting.   pantoprazole 40 MG tablet Commonly known as:  PROTONIX Take 1 tablet (40 mg total) by mouth daily.   Pen Needles 31G X 5 MM Misc 90 each by Does not apply route 4 (four) times daily -  before meals and at bedtime.   traMADol 50 MG tablet Commonly known as:  ULTRAM Take 1-2 tablets (50-100 mg total) by mouth every 6 (six) hours as needed for moderate pain  or severe pain.   traZODone 150 MG tablet Commonly known as:  DESYREL Take 150 mg by mouth at bedtime.      Follow-up Information    [facility] Follow up.   Specialty:  Specialist Why:  You will be contacted by the office to schedule an appointment Contact information: [address] [place] [address] [phone]        Nolene Ebbs, MD. Schedule an appointment as soon as possible for a visit in 1 week(s).   Specialty:  Internal Medicine Contact information: [place] [address] [phone]          Allergies  Allergen Reactions  . Sulfa Antibiotics Other (See Comments)    Unknown childhood allergy    Consultations:  Called Orthopedic Surgery - appointment to be made as outpatient  Procedures/Studies: Dg Chest 2 View  Result Date: 07/04/2017 CLINICAL DATA:  Chest pain EXAM: CHEST  2 VIEW COMPARISON:  Chest radiograph 07/01/2017 FINDINGS: The heart size and mediastinal contours are within normal limits. Both lungs are clear. The visualized  skeletal structures are unremarkable. IMPRESSION: No active cardiopulmonary disease. Electronically Signed   By: Ulyses Jarred M.D.   On: 07/04/2017 21:55   Dg Chest 2 View  Result Date: 06/06/2017 CLINICAL DATA:  31 year old male with chest pain. EXAM: CHEST  2 VIEW COMPARISON:  None. FINDINGS: The heart size and mediastinal contours are within normal limits. Both lungs are clear. The visualized skeletal structures are unremarkable. IMPRESSION: No active cardiopulmonary disease. Electronically Signed   By: Anner Crete M.D.   On: 06/06/2017 19:00   Dg Shoulder Right  Result Date: 07/06/2017 CLINICAL DATA:  Status post fall. EXAM: RIGHT SHOULDER - 2+ VIEW COMPARISON:  None. FINDINGS: There is no evidence of fracture or dislocation. There is no evidence of arthropathy or other focal bone abnormality. Soft tissues are unremarkable. IMPRESSION: No acute osseous injury of the  right shoulder. Electronically Signed   By: Kathreen Devoid   On: 07/06/2017 09:56   Ct Head Wo Contrast  Result Date: 07/04/2017 CLINICAL DATA:  Right hand numbness, polysubstance abuse. EXAM: CT HEAD WITHOUT CONTRAST TECHNIQUE: Contiguous axial images were obtained from the base of the skull through the vertex without intravenous contrast. COMPARISON:  08/19/2011 FINDINGS: Brain: No evidence of acute infarction, hemorrhage, hydrocephalus, extra-axial collection or mass lesion/mass effect. Vascular: No hyperdense vessel or unexpected calcification. Skull: Normal. Negative for fracture or focal lesion. Sinuses/Orbits: No acute finding. Other: None IMPRESSION: No acute intracranial abnormality or significant change. Electronically Signed   By: Ashley Royalty M.D.   On: 07/04/2017 23:43   Ct Angio Chest Pe W Or Wo Contrast  Result Date: 06/10/2017 CLINICAL DATA:  Sternal chest pain for 3 or 4 days. EXAM: CT ANGIOGRAPHY CHEST WITH CONTRAST TECHNIQUE: Multidetector CT imaging of the chest was performed using the standard protocol during bolus administration of intravenous contrast. Multiplanar CT image reconstructions and MIPs were obtained to evaluate the vascular anatomy. CONTRAST:  100 mL of Isovue 370 COMPARISON:  Chest x-ray June 06, 2017 FINDINGS: Cardiovascular: No coronary artery calcifications are identified. The heart is normal. The thoracic aorta is non aneurysmal with no atherosclerosis or dissection. No pulmonary emboli are identified. Mediastinum/Nodes: Mild gynecomastia. The thyroid is normal. The esophagus is unremarkable. No adenopathy. Lungs/Pleura: Central airways are normal. No pneumothorax. Small bilateral pleural effusions with associated atelectasis are identified. There is a 5.7 mm nodule in the right lung on series 7, image 68. No other nodules. No masses. No suspicious infiltrates. Upper Abdomen: Limited views the upper abdomen are unremarkable. Musculoskeletal: No chest wall abnormality. No  acute or significant osseous findings. Review of the MIP images confirms the above findings. IMPRESSION: 1. Small bilateral pleural effusions with compressive atelectasis. 2. No pulmonary emboli are identified. 3. No cause for the patient's symptoms is identified. 4. 5.7 mm nodule in the right lung. No follow-up needed if patient is low-risk. Non-contrast chest CT can be considered in 12 months if patient is high-risk. This recommendation follows the consensus statement: Guidelines for Management of Incidental Pulmonary Nodules Detected on CT Images: From the Fleischner Society 2017; Radiology 2017; 284:228-243. Electronically Signed   By: Dorise Bullion III M.D   On: 06/10/2017 15:46   Nm Pulmonary Perf And Vent  Result Date: 07/05/2017 CLINICAL DATA:  31 year old male with left-sided chest pain. Rule out PE. EXAM: NUCLEAR MEDICINE VENTILATION - PERFUSION LUNG SCAN TECHNIQUE: Ventilation images were obtained in multiple projections using inhaled aerosol Tc-1mDTPA. Perfusion images were obtained in multiple projections after intravenous injection of Tc-92mAA. RADIOPHARMACEUTICALS:  32 mCi Technetium-19mDTPA aerosol inhalation and 4.3 mCi Technetium-960mAA IV COMPARISON:  Chest radiograph dated 07/04/2017 FINDINGS: Ventilation: No focal ventilation defect. Perfusion: No wedge shaped peripheral perfusion defects to suggest acute pulmonary embolism. IMPRESSION: Normal study. Electronically Signed   By: ArAnner Crete.D.   On: 07/05/2017 03:44   Dg Chest Port 1 View  Result Date: 07/01/2017 CLINICAL DATA:  Altered mental status.  Polysubstance abuse. EXAM: PORTABLE CHEST 1 VIEW COMPARISON:  06/06/2017 and chest CTA dated 06/10/2017. FINDINGS: The heart size and mediastinal contours are within normal limits. Both lungs are clear. The visualized skeletal structures are unremarkable. IMPRESSION: Normal examination. Electronically Signed   By: StClaudie Revering.D.   On: 07/01/2017 10:46   Dg Abd Portable  1v  Result Date: 07/05/2017 CLINICAL DATA:  Pt c/o nausea and abdominal pain for 2 days. Hx diabetes, HTN, GERD. EXAM: PORTABLE ABDOMEN - 1 VIEW COMPARISON:  None. FINDINGS: Bowel gas pattern is nonobstructive. No evidence of soft tissue mass or abnormal fluid collection. No evidence of free intraperitoneal air. 6 mm calcification in the right lower pelvis, of uncertain etiology, suspected phlebolith. Osseous structures are unremarkable. Lung bases are clear. IMPRESSION: 1. 6 mm calcification in the right lower pelvis, suspected benign vascular phlebolith, but distal ureteral stone cannot be excluded based on its position. If any hematuria or symptoms suggesting right-sided ureteral colic, consider renal stone protocol CT for further characterization. 2. Nonobstructive bowel gas pattern. Electronically Signed   By: StFranki Cabot.D.   On: 07/05/2017 19:28   Dg Hand Complete Right  Result Date: 07/06/2017 CLINICAL DATA:  Status post fall.  Numbness in the right hand. EXAM: RIGHT HAND - COMPLETE 3+ VIEW COMPARISON:  None. FINDINGS: There is no evidence of fracture or dislocation. There is no evidence of arthropathy or other focal bone abnormality. Soft tissues are unremarkable. IMPRESSION: No acute osseous injury of the right hand. Electronically Signed   By: HeKathreen Devoid On: 07/06/2017 09:55   Dg Foot 2 Views Left  Result Date: 07/06/2017 CLINICAL DATA:  Left foot tenderness. EXAM: LEFT FOOT - 2 VIEW COMPARISON:  None. FINDINGS: There is no evidence of fracture or dislocation. There is no evidence of arthropathy or other focal bone abnormality. Soft tissues are unremarkable. IMPRESSION: Negative. Electronically Signed   By: DoFidela Salisbury.D.   On: 07/06/2017 09:54    Subjective: Without complaints at this time  Discharge Exam: Vitals:   07/06/17 0308 07/06/17 1356  BP: (!) 140/95 (!) 125/91  Pulse: 90   Resp: 18 19  Temp: 98.4 F (36.9 C) 98.4 F (36.9 C)   Vitals:   07/05/17 1925  07/06/17 0038 07/06/17 0308 07/06/17 1356  BP: 127/87 (!) 130/91 (!) 140/95 (!) 125/91  Pulse: 96 (!) 101 90   Resp: '15 15 18 19  '$ Temp: 98.2 F (36.8 C) 98.5 F (36.9 C) 98.4 F (36.9 C) 98.4 F (36.9 C)  TempSrc: Oral Oral Oral Oral  SpO2: 98% 99% 99% 99%  Weight:      Height:        General: Pt is alert, awake, not in acute distress Cardiovascular: RRR, S1/S2 +, no rubs, no gallops Respiratory: CTA bilaterally, no wheezing, no rhonchi Abdominal: Soft, NT, ND, bowel sounds + Extremities: no edema, no cyanosis   The results of significant diagnostics from this hospitalization (including imaging, microbiology, ancillary and laboratory) are listed below for reference.     Microbiology: Recent Results (from the past 240  hour(s))  Blood Culture (routine x 2)     Status: None   Collection Time: 07/01/17 11:50 AM  Result Value Ref Range Status   Specimen Description BLOOD LEFT HAND  Final   Special Requests IN PEDIATRIC BOTTLE Blood Culture adequate volume  Final   Culture NO GROWTH 5 DAYS  Final   Report Status 07/06/2017 FINAL  Final  Blood Culture (routine x 2)     Status: None   Collection Time: 07/01/17 11:55 AM  Result Value Ref Range Status   Specimen Description BLOOD RIGHT WRIST  Final   Special Requests IN PEDIATRIC BOTTLE Blood Culture adequate volume  Final   Culture NO GROWTH 5 DAYS  Final   Report Status 07/06/2017 FINAL  Final  Culture, Urine     Status: None   Collection Time: 07/01/17 12:42 PM  Result Value Ref Range Status   Specimen Description URINE, RANDOM  Final   Special Requests NONE  Final   Culture NO GROWTH  Final   Report Status 07/02/2017 FINAL  Final  MRSA PCR Screening     Status: None   Collection Time: 07/01/17  4:30 PM  Result Value Ref Range Status   MRSA by PCR NEGATIVE NEGATIVE Final    Comment:        The GeneXpert MRSA Assay (FDA approved for NASAL specimens only), is one component of a comprehensive MRSA  colonization surveillance program. It is not intended to diagnose MRSA infection nor to guide or monitor treatment for MRSA infections.      Labs: BNP (last 3 results) No results for input(s): BNP in the last 8760 hours. Basic Metabolic Panel:  Recent Labs Lab 07/01/17 1807  07/02/17 0310  07/02/17 1013 07/02/17 1510 07/03/17 0316  07/05/17 0115 07/05/17 0628 07/05/17 1158 07/05/17 1422 07/06/17 1037  NA  --   < > 146*  < > 144 146* 145  < > 137 141 139 140 142  K  --   < > 3.0*  < > 3.3* 3.2* 2.9*  < > 4.1 3.3* 3.3* 3.3* 3.3*  CL  --   < > 116*  < > 117* 117* 115*  < > 106 112* 110 109 106  CO2  --   < > 12*  < > 18* 21* 18*  < > 14* '22 24 25 26  '$ GLUCOSE  --   < > 138*  < > 197* 169* 299*  < > 535* 249* 214* 190* 265*  BUN  --   < > 44*  < > 35* 31* 21*  < > '13 11 8 7 7  '$ CREATININE  --   < > 2.19*  < > 1.71* 1.47* 1.30*  < > 1.44* 1.12 0.87 0.86 0.90  CALCIUM  --   < > 7.6*  < > 7.2* 7.5* 8.0*  < > 8.0* 8.3* 8.1* 8.3* 8.9  MG  --   --  1.7  --  1.6* 1.8 1.9  --   --   --   --   --   --   PHOS 3.5  --  <1.0*  --  2.7 2.5 1.7*  --   --   --   --   --   --   < > = values in this interval not displayed. Liver Function Tests:  Recent Labs Lab 07/01/17 1019  AST 14*  ALT 19  ALKPHOS 158*  BILITOT 1.9*  PROT 6.0*  ALBUMIN 3.6   No  results for input(s): LIPASE, AMYLASE in the last 168 hours. No results for input(s): AMMONIA in the last 168 hours. CBC:  Recent Labs Lab 07/01/17 1019 07/01/17 1030 07/02/17 0310 07/03/17 0316 07/04/17 2102  WBC 55.9*  --  18.7* 8.4 5.1  NEUTROABS 43.5*  --   --  6.9  --   HGB 12.7* 13.9 10.8* 9.7* 11.1*  HCT 40.7 41.0 29.8* 27.4* 33.0*  MCV 92.7  --  79.9 78.7 84.0  PLT 525*  --  273 178 171   Cardiac Enzymes:  Recent Labs Lab 07/01/17 1019 07/01/17 1807 07/05/17 0115 07/05/17 0628 07/05/17 1158 07/06/17 1037  CKTOTAL 209  --   --   --   --   --   TROPONINI  --  <0.03 0.11* 0.12* 0.14* 0.05*   BNP: Invalid  input(s): POCBNP CBG:  Recent Labs Lab 07/05/17 1611 07/05/17 2049 07/06/17 0705 07/06/17 1106 07/06/17 1655  GLUCAP 175* 202* 316* 324* 147*   D-Dimer  Recent Labs  07/04/17 2224  DDIMER 1.58*   Hgb A1c No results for input(s): HGBA1C in the last 72 hours. Lipid Profile No results for input(s): CHOL, HDL, LDLCALC, TRIG, CHOLHDL, LDLDIRECT in the last 72 hours. Thyroid function studies No results for input(s): TSH, T4TOTAL, T3FREE, THYROIDAB in the last 72 hours.  Invalid input(s): FREET3 Anemia work up No results for input(s): VITAMINB12, FOLATE, FERRITIN, TIBC, IRON, RETICCTPCT in the last 72 hours. Urinalysis    Component Value Date/Time   COLORURINE STRAW (A) 07/05/2017 0034   APPEARANCEUR CLEAR 07/05/2017 0034   LABSPEC 1.022 07/05/2017 0034   PHURINE 6.0 07/05/2017 0034   GLUCOSEU >=500 (A) 07/05/2017 0034   HGBUR NEGATIVE 07/05/2017 0034   BILIRUBINUR NEGATIVE 07/05/2017 0034   BILIRUBINUR negative 03/29/2015 1114   KETONESUR 80 (A) 07/05/2017 0034   PROTEINUR NEGATIVE 07/05/2017 0034   UROBILINOGEN 0.2 05/17/2015 1810   NITRITE NEGATIVE 07/05/2017 0034   LEUKOCYTESUR NEGATIVE 07/05/2017 0034   Sepsis Labs Invalid input(s): PROCALCITONIN,  WBC,  LACTICIDVEN Microbiology Recent Results (from the past 240 hour(s))  Blood Culture (routine x 2)     Status: None   Collection Time: 07/01/17 11:50 AM  Result Value Ref Range Status   Specimen Description BLOOD LEFT HAND  Final   Special Requests IN PEDIATRIC BOTTLE Blood Culture adequate volume  Final   Culture NO GROWTH 5 DAYS  Final   Report Status 07/06/2017 FINAL  Final  Blood Culture (routine x 2)     Status: None   Collection Time: 07/01/17 11:55 AM  Result Value Ref Range Status   Specimen Description BLOOD RIGHT WRIST  Final   Special Requests IN PEDIATRIC BOTTLE Blood Culture adequate volume  Final   Culture NO GROWTH 5 DAYS  Final   Report Status 07/06/2017 FINAL  Final  Culture, Urine      Status: None   Collection Time: 07/01/17 12:42 PM  Result Value Ref Range Status   Specimen Description URINE, RANDOM  Final   Special Requests NONE  Final   Culture NO GROWTH  Final   Report Status 07/02/2017 FINAL  Final  MRSA PCR Screening     Status: None   Collection Time: 07/01/17  4:30 PM  Result Value Ref Range Status   MRSA by PCR NEGATIVE NEGATIVE Final    Comment:        The GeneXpert MRSA Assay (FDA approved for NASAL specimens only), is one component of a comprehensive MRSA colonization surveillance program. It is  not intended to diagnose MRSA infection nor to guide or monitor treatment for MRSA infections.      SIGNED:   Donne Hazel, MD  [facility] 07/06/2017, 5:52 PM  If 7PM-7AM, please contact night-coverage [email or web address] Password [email or web address]

## 2017-07-06 NOTE — Progress Notes (Signed)
Pt discharged after discharge instructions and prescription given. All questions answered.

## 2017-07-06 NOTE — Progress Notes (Signed)
Pt is refusing to let me put his telemetry back on I explained to pt that it is important for [place] to monitor he states " you can do it tomorrow " will continue to monitor

## 2017-07-06 NOTE — Plan of Care (Signed)
Problem: Safety: Goal: Ability to remain free from injury will improve Outcome: Progressing Bed in low position, call bell and belongings in reach, non skid socks on, calls appropriately for assistance.

## 2017-07-06 NOTE — Progress Notes (Signed)
Initial Nutrition Assessment  DOCUMENTATION CODES:   Not applicable  INTERVENTION:    Glucerna Shake po TID, each supplement provides 220 kcal and 10 grams of protein  NUTRITION DIAGNOSIS:   Inadequate oral intake related to poor appetite as evidenced by per patient/family report.  GOAL:   Patient will meet greater than or equal to 90% of their needs  MONITOR:   PO intake, Supplement acceptance  REASON FOR ASSESSMENT:   Consult Assessment of nutrition requirement/status  ASSESSMENT:   31 yo male with PMH of depression, polysubstance abuse, scoliosis, noncompliance with meds, GERD, HTN, DM-1, anxiety, schizo affective schizophrenia who was admitted on 7/18 with chest pain, R hand numbness, DKA. He left AMA on 7/17 from a hospitalization for DKA.  Patient reports that he has been having trouble eating recently, he chews food, but can't swallow it. "Nothing tastes good." 5% weight loss within the past month. Nutrition-Focused physical exam completed. Findings are no fat depletion, no muscle depletion, and no edema.  Patient has been drinking Glucerna Shakes since this admission. Labs and medications reviewed. Potassium is low, receiving KCl.  Diet Order:  Diet Carb Modified Fluid consistency: Thin; Room service appropriate? Yes  Skin:  Reviewed, no issues  Last BM:  unknown  Height:   Ht Readings from Last 1 Encounters:  07/05/17 6\' 1"  (1.854 m)    Weight:   Wt Readings from Last 1 Encounters:  07/05/17 185 lb 9.6 oz (84.2 kg)    Ideal Body Weight:  83.6 kg  BMI:  Body mass index is 24.49 kg/m.  Estimated Nutritional Needs:   Kcal:  2300-2500  Protein:  100-115 gm  Fluid:  2.3-2.5 L  EDUCATION NEEDS:   Education needs addressed  07/07/17, RD, LDN, CNSC Pager [phone] After Hours Pager [phone]

## 2017-07-07 LAB — GLUCOSE, CAPILLARY
GLUCOSE-CAPILLARY: 280 mg/dL — AB (ref 65–99)
Glucose-Capillary: 136 mg/dL — ABNORMAL HIGH (ref 65–99)

## 2017-07-10 ENCOUNTER — Ambulatory Visit (INDEPENDENT_AMBULATORY_CARE_PROVIDER_SITE_OTHER): Payer: Medicare Other | Admitting: Orthopaedic Surgery

## 2017-07-10 ENCOUNTER — Encounter (INDEPENDENT_AMBULATORY_CARE_PROVIDER_SITE_OTHER): Payer: Self-pay | Admitting: Orthopaedic Surgery

## 2017-07-10 DIAGNOSIS — M79641 Pain in right hand: Secondary | ICD-10-CM

## 2017-07-10 DIAGNOSIS — M25511 Pain in right shoulder: Secondary | ICD-10-CM

## 2017-07-10 NOTE — Progress Notes (Signed)
Office Visit Note   Patient: [name]           Date of Birth: 01-Oct-1986           MRN: [id number] Visit Date: 07/10/2017              Requested by: Fleet Contras, MD [address] [place], [place] [address] PCP: Fleet Contras, MD   Assessment & Plan: Visit Diagnoses:  1. Pain in right hand   2. Acute pain of right shoulder     Plan: Patient was very rude and demanding during the encounter. He demanded a right shoulder injection which I don't think is necessarily a good idea especially since he is a poorly controlled diabetic recently had a syncopal episode because of this. He is very upset by this and then demanded pain medicines which I refused to give him. He also demanded a second opinion immediately which I said I was happy to give him a referral for.  I referred him for a nerve conduction study to rule out carpal tunnel syndrome versus peripheral neuropathy or radiculopathy. Internal the shoulder injection we can consider a Toradol injection with Dr. Alvester Morin if he feels that this is appropriate. Patient is to follow up after the studies. Total face to face encounter time was greater than 45 minutes and over half of this time was spent in counseling and/or coordination of care.  Follow-Up Instructions: Return if symptoms worsen or fail to improve.   Orders:  Orders Placed This Encounter  Procedures  . Ambulatory referral to Physical Medicine Rehab  . Ambulatory referral to Physical Medicine Rehab   No orders of the defined types were placed in this encounter.     Procedures: No procedures performed   Clinical Data: No additional findings.   Subjective: Chief Complaint  Patient presents with  . Right Shoulder - Pain  . Right Hand - Pain    Patient is a 31 year old, who comes in with right shoulder pain and right hand numbness. He denies any true radicular symptoms. He was recently evaluated in the ER for being found unconscious at home likely due to  poorly controlled diabetes and elevated blood sugars. He is coming in today requesting a right shoulder injection.    Review of Systems  Constitutional: Negative.   All other systems reviewed and are negative.    Objective: Vital Signs: There were no vitals taken for this visit.  Physical Exam  Constitutional: He is oriented to person, place, and time. He appears well-developed and well-nourished.  HENT:  Head: Normocephalic and atraumatic.  Eyes: Pupils are equal, round, and reactive to light.  Neck: Neck supple.  Pulmonary/Chest: Effort normal.  Abdominal: Soft.  Musculoskeletal: Normal range of motion.  Neurological: He is alert and oriented to person, place, and time.  Skin: Skin is warm.  Psychiatric: He has a normal mood and affect. His behavior is normal. Judgment and thought content normal.  Nursing note and vitals reviewed.   Ortho Exam Right shoulder exam is nonfocal. He refused to cooperate fully with the exam.  From what I can gather not able to identify pathology.  Right hand exam is also nonfocal. He endorses numbness of the right hand. Specialty Comments:  No specialty comments available.  Imaging: No results found.   PMFS History: Patient Active Problem List   Diagnosis Date Noted  . Normocytic anemia 07/05/2017  . Numbness of right hand 07/05/2017  . Elevated troponin I measurement   . Positive  D dimer   . DKA, type 1, not at goal [facility]) 07/01/2017  . Acute kidney injury (nontraumatic) ([name])   . Medically noncompliant   . Hyperglycemia 06/16/2017  . Nausea & vomiting 06/15/2017  . Abdominal pain 06/15/2017  . Chest pain 06/07/2017  . GERD (gastroesophageal reflux disease) 06/07/2017  . Substance abuse 05/30/2016  . Substance induced mood disorder ([name]) 05/30/2016  . Tobacco use disorder 12/24/2015  . DM hyperosmolarity type I, uncontrolled ([name]) 12/19/2015  . Generalized headache 07/22/2015  . Bilateral hip bursitis 05/26/2015  . Depression    . Generalized anxiety disorder 03/29/2015  . Undifferentiated schizophrenia ([name])   . Hip pain, bilateral 11/18/2014  . Dehydration 06/17/2012   Past Medical History:  Diagnosis Date  . Anemia   . Anxiety   . Arthritis    "both hips; both shoulders" (06/06/2017)  . Chicken pox   . Childhood asthma   . Chronic pain   . Depression   . DKA (diabetic ketoacidoses) ([name]) 11/18/2014  . DKA (diabetic ketoacidoses) ([name]) 07/05/2017  . GERD (gastroesophageal reflux disease)   . Hypertension   . Migraine    "a few/year" (06/06/2017)  . Noncompliance with medication regimen   . Pneumonia    "several times" (06/06/2017)  . Polysubstance abuse   . Schizo affective schizophrenia ([name])   . Scoliosis   . Type I diabetes mellitus ([name])     Family History  Problem Relation Age of Onset  . Diabetes Mother     Past Surgical History:  Procedure Laterality Date  . CARDIAC CATHETERIZATION  11/2016  . INCISION AND DRAINAGE ABSCESS Left 11/2011   "MRSA removed off my thumb"   Social History   Occupational History  . Disability    Social History Main Topics  . Smoking status: Current Every Day Smoker    Packs/day: 0.50    Years: 9.00    Types: Cigarettes  . Smokeless tobacco: Never Used  . Alcohol use No  . Drug use: Yes    Types: Marijuana, Cocaine, Methamphetamines, "Crack" cocaine     Comment: 06/06/2017 "nothing in the last 5 years"  . Sexual activity: Not Currently

## 2017-07-11 ENCOUNTER — Encounter: Payer: Self-pay | Admitting: Podiatry

## 2017-07-11 ENCOUNTER — Ambulatory Visit (INDEPENDENT_AMBULATORY_CARE_PROVIDER_SITE_OTHER): Payer: Medicare Other | Admitting: Podiatry

## 2017-07-11 VITALS — BP 119/85 | HR 109 | Resp 16 | Ht 73.0 in | Wt 200.0 lb

## 2017-07-11 DIAGNOSIS — L02619 Cutaneous abscess of unspecified foot: Secondary | ICD-10-CM

## 2017-07-11 DIAGNOSIS — L03119 Cellulitis of unspecified part of limb: Secondary | ICD-10-CM | POA: Diagnosis not present

## 2017-07-11 MED ORDER — CEPHALEXIN 500 MG PO CAPS: 500.0000 mg | ORAL_CAPSULE | Freq: Three times a day (TID) | ORAL | 1 refills | Status: DC

## 2017-07-11 NOTE — Progress Notes (Signed)
   Subjective:    Patient ID: [name], male    DOB: 09/07/1986, 31 y.o.   MRN: [id number]  HPI Chief Complaint  Patient presents with  . Blister    Left foot; bottom and back of heel; pt stated, "Has had for the past 2 weeks"; pt Diabetic Type 1; Sugar=147 this am; A1C=14,0      Review of Systems  Constitutional: Positive for activity change, appetite change and fatigue.  Gastrointestinal: Positive for vomiting.  Musculoskeletal: Positive for arthralgias and gait problem.  All other systems reviewed and are negative.      Objective:   Physical Exam        Assessment & Plan:
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
## 2017-07-24 ENCOUNTER — Encounter (INDEPENDENT_AMBULATORY_CARE_PROVIDER_SITE_OTHER): Payer: Self-pay | Admitting: Physical Medicine and Rehabilitation

## 2017-07-24 ENCOUNTER — Ambulatory Visit (INDEPENDENT_AMBULATORY_CARE_PROVIDER_SITE_OTHER): Payer: Medicare Other | Admitting: Physical Medicine and Rehabilitation

## 2017-07-24 DIAGNOSIS — M25511 Pain in right shoulder: Secondary | ICD-10-CM | POA: Diagnosis not present

## 2017-07-24 DIAGNOSIS — G8929 Other chronic pain: Secondary | ICD-10-CM | POA: Diagnosis not present

## 2017-07-24 NOTE — Progress Notes (Unsigned)
Fluoro Time: 10 sec MGy:  3.14

## 2017-07-24 NOTE — Patient Instructions (Signed)

## 2017-07-24 NOTE — Progress Notes (Deleted)
Pain for around 2 years. Worse recently. Can't lay on right side. Denies any radiating pain. Pain with raising arm.

## 2017-07-24 NOTE — Progress Notes (Signed)
[name] - 31 y.o. male MRN [id number]  Date of birth: 06-13-1986  Office Visit Note: Visit Date: 07/24/2017 PCP: Fleet Contras, MD Referred by: Fleet Contras, MD  Subjective: Chief Complaint  Patient presents with  . Right Shoulder - Pain   HPI: [name] is a 31 year old gentleman referred here for right diagnostic and therapeutic glenohumeral joint arthrogram. He recently saw Dr. Roda Shutters who requested the injection. Evidently per Dr. Roda Shutters notes to patient was not very cooperative during his evaluation.  Today the patient is very cooperative. He is having right shoulder pain with decreased range of motion. This been ongoing for several months. He denies any numbness tingling or paresthesia. We will complete a diagnostic and therapeutic right anesthetic arthrogram of the glenohumeral joint. The patient has type 1 diabetes with history of multiple hospitalizations for diabetic ketoacidosis and poor control of his blood sugars. We will limit the medication to 20 mg of Kenalog as well as using some Toradol. Diagnostically the anesthetic phase will help determine if this is a glenohumeral process. His case is, who by significant history of polysubstance abuse as well as behavioral health issues including depression and anxiety and schizoaffective disorder.    ROS Otherwise per HPI.  Assessment & Plan: Visit Diagnoses:  1. Chronic right shoulder pain     Plan: Findings:  Diagnostic and therapeutic right glenohumeral joint anesthetic arthrogram. The patient had significantly decreased symptoms during the anesthetic phase of the injection and increased range of motion.    Meds & Orders: No orders of the defined types were placed in this encounter.   Orders Placed This Encounter  Procedures  . Large Joint Injection/Arthrocentesis  . XR C-ARM NO REPORT    Follow-up: Return if symptoms worsen or fail to improve, for Dr. Roda Shutters.   Procedures: Glenohumeral joint anesthetic  arthrogram Date/Time: 07/24/2017 4:24 PM Performed by: Tyrell Antonio Authorized by: Tyrell Antonio   Consent Given by:  Patient Site marked: the procedure site was marked   Timeout: prior to procedure the correct patient, procedure, and site was verified   Indications:  Pain and diagnostic evaluation Location:  Shoulder Site:  R glenohumeral Prep: patient was prepped and draped in usual sterile fashion   Needle Size:  22 G Needle Length:  3.5 inches Approach:  Anteromedial Ultrasound Guidance: No   Fluoroscopic Guidance: No   Arthrogram: Yes   Medications:  3 mL bupivacaine 0.5 %; 30 mg triamcinolone acetonide 40 MG/ML (30 mg of Toradol was also utilized) Aspiration Attempted: No   Patient tolerance:  Patient tolerated the procedure well with no immediate complications  Arthrogram demonstrated excellent flow of contrast throughout the joint surface without extravasation or obvious defect.  The patient had relief of symptoms during the anesthetic phase of the injection.       No notes on file   Clinical History: No specialty comments available.  He reports that he has been smoking Cigarettes.  He has a 4.50 pack-year smoking history. He has never used smokeless tobacco.   Recent Labs  06/16/17 1132  HGBA1C 10.8*    Objective:  VS:  HT:    WT:   BMI:     BP:   HR: bpm  TEMP: ( )  RESP:  Physical Exam  Musculoskeletal:  Pain with abduction and extension with decreased range of motion on the right shoulder.    Ortho Exam Imaging: Xr C-arm No Report  Result Date: 07/24/2017 Please see Notes or Procedures tab for imaging  impression.   Past Medical/Family/Surgical/Social History: Medications & Allergies reviewed per EMR Patient Active Problem List   Diagnosis Date Noted  . Normocytic anemia 07/05/2017  . Numbness of right hand 07/05/2017  . Elevated troponin I measurement   . Positive D dimer   . DKA, type 1, not at goal [facility]) 07/01/2017  . Acute kidney  injury (nontraumatic) ([name])   . Medically noncompliant   . Hyperglycemia 06/16/2017  . Nausea & vomiting 06/15/2017  . Abdominal pain 06/15/2017  . Chest pain 06/07/2017  . GERD (gastroesophageal reflux disease) 06/07/2017  . Substance abuse 05/30/2016  . Substance induced mood disorder ([name]) 05/30/2016  . Tobacco use disorder 12/24/2015  . DM hyperosmolarity type I, uncontrolled ([name]) 12/19/2015  . Generalized headache 07/22/2015  . Bilateral hip bursitis 05/26/2015  . Depression   . Generalized anxiety disorder 03/29/2015  . Undifferentiated schizophrenia ([name])   . Hip pain, bilateral 11/18/2014  . Dehydration 06/17/2012   Past Medical History:  Diagnosis Date  . Anemia   . Anxiety   . Arthritis    "both hips; both shoulders" (06/06/2017)  . Chicken pox   . Childhood asthma   . Chronic pain   . Depression   . DKA (diabetic ketoacidoses) ([name]) 11/18/2014  . DKA (diabetic ketoacidoses) ([name]) 07/05/2017  . GERD (gastroesophageal reflux disease)   . Hypertension   . Migraine    "a few/year" (06/06/2017)  . Noncompliance with medication regimen   . Pneumonia    "several times" (06/06/2017)  . Polysubstance abuse   . Schizo affective schizophrenia ([name])   . Scoliosis   . Type I diabetes mellitus ([name])    Family History  Problem Relation Age of Onset  . Diabetes Mother    Past Surgical History:  Procedure Laterality Date  . CARDIAC CATHETERIZATION  11/2016  . INCISION AND DRAINAGE ABSCESS Left 11/2011   "MRSA removed off my thumb"   Social History   Occupational History  . Disability    Social History Main Topics  . Smoking status: Current Every Day Smoker    Packs/day: 0.50    Years: 9.00    Types: Cigarettes  . Smokeless tobacco: Never Used  . Alcohol use No  . Drug use: Yes    Types: Marijuana, Cocaine, Methamphetamines, "Crack" cocaine     Comment: 06/06/2017 "nothing in the last 5 years"  . Sexual activity: Not Currently

## 2017-07-25 ENCOUNTER — Ambulatory Visit (INDEPENDENT_AMBULATORY_CARE_PROVIDER_SITE_OTHER): Payer: Medicare Other

## 2017-07-25 MED ORDER — BUPIVACAINE HCL 0.5 % IJ SOLN
3.0000 mL | INTRAMUSCULAR | Status: AC | PRN
  Administered 2017-07-24: 3 mL via INTRA_ARTICULAR

## 2017-07-25 MED ORDER — TRIAMCINOLONE ACETONIDE 40 MG/ML IJ SUSP
30.0000 mg | INTRAMUSCULAR | Status: AC | PRN
  Administered 2017-07-24: 30 mg via INTRA_ARTICULAR

## 2017-07-27 ENCOUNTER — Telehealth (INDEPENDENT_AMBULATORY_CARE_PROVIDER_SITE_OTHER): Payer: Self-pay | Admitting: Radiology

## 2017-07-27 ENCOUNTER — Ambulatory Visit (INDEPENDENT_AMBULATORY_CARE_PROVIDER_SITE_OTHER): Payer: Medicare Other | Admitting: Physical Medicine and Rehabilitation

## 2017-07-27 ENCOUNTER — Encounter (INDEPENDENT_AMBULATORY_CARE_PROVIDER_SITE_OTHER): Payer: Self-pay | Admitting: Physical Medicine and Rehabilitation

## 2017-07-27 DIAGNOSIS — G8929 Other chronic pain: Secondary | ICD-10-CM

## 2017-07-27 DIAGNOSIS — M25511 Pain in right shoulder: Principal | ICD-10-CM

## 2017-07-27 DIAGNOSIS — R202 Paresthesia of skin: Secondary | ICD-10-CM | POA: Diagnosis not present

## 2017-07-27 NOTE — Telephone Encounter (Signed)
Per Dr. Alvester Morin may want to consider an MRI of the right shoulder.

## 2017-07-27 NOTE — Progress Notes (Deleted)
RT upper ext, hand felt numb but not as bad as it was, feels more like pressure over the hand, onset about 3 weeks now.  Right shoulder injection-helped some, ale to lift arm up but can't lift out to the side due to the pain. No lotions, right handed

## 2017-07-27 NOTE — Telephone Encounter (Signed)
Ok that's fine.  R/o structural abnormalities

## 2017-07-27 NOTE — Telephone Encounter (Signed)
Order made they will contact him to make appt

## 2017-07-27 NOTE — Telephone Encounter (Signed)
See message below °

## 2017-07-30 ENCOUNTER — Inpatient Hospital Stay (HOSPITAL_COMMUNITY)
Admission: EM | Admit: 2017-07-30 | Discharge: 2017-07-31 | DRG: 637 | Discharge status: Against Medical Advice | Payer: Medicare Other | Attending: Emergency Medicine | Admitting: Emergency Medicine

## 2017-07-30 ENCOUNTER — Emergency Department (HOSPITAL_COMMUNITY): Payer: Medicare Other

## 2017-07-30 ENCOUNTER — Inpatient Hospital Stay (HOSPITAL_COMMUNITY): Payer: Medicare Other

## 2017-07-30 ENCOUNTER — Encounter (HOSPITAL_COMMUNITY): Payer: Self-pay | Admitting: Emergency Medicine

## 2017-07-30 DIAGNOSIS — R000 Tachycardia, unspecified: Secondary | ICD-10-CM | POA: Diagnosis present

## 2017-07-30 DIAGNOSIS — F259 Schizoaffective disorder, unspecified: Secondary | ICD-10-CM | POA: Diagnosis present

## 2017-07-30 DIAGNOSIS — T148XXD Other injury of unspecified body region, subsequent encounter: Secondary | ICD-10-CM

## 2017-07-30 DIAGNOSIS — F17210 Nicotine dependence, cigarettes, uncomplicated: Secondary | ICD-10-CM | POA: Diagnosis present

## 2017-07-30 DIAGNOSIS — D72829 Elevated white blood cell count, unspecified: Secondary | ICD-10-CM | POA: Diagnosis present

## 2017-07-30 DIAGNOSIS — G9340 Encephalopathy, unspecified: Secondary | ICD-10-CM | POA: Diagnosis not present

## 2017-07-30 DIAGNOSIS — E875 Hyperkalemia: Secondary | ICD-10-CM | POA: Diagnosis not present

## 2017-07-30 DIAGNOSIS — R402352 Coma scale, best motor response, localizes pain, at arrival to emergency department: Secondary | ICD-10-CM | POA: Diagnosis present

## 2017-07-30 DIAGNOSIS — J9601 Acute respiratory failure with hypoxia: Secondary | ICD-10-CM | POA: Diagnosis not present

## 2017-07-30 DIAGNOSIS — N179 Acute kidney failure, unspecified: Secondary | ICD-10-CM | POA: Diagnosis present

## 2017-07-30 DIAGNOSIS — Z9119 Patient's noncompliance with other medical treatment and regimen: Secondary | ICD-10-CM | POA: Diagnosis not present

## 2017-07-30 DIAGNOSIS — E1010 Type 1 diabetes mellitus with ketoacidosis without coma: Secondary | ICD-10-CM

## 2017-07-30 DIAGNOSIS — A419 Sepsis, unspecified organism: Secondary | ICD-10-CM | POA: Diagnosis not present

## 2017-07-30 DIAGNOSIS — R402222 Coma scale, best verbal response, incomprehensible words, at arrival to emergency department: Secondary | ICD-10-CM | POA: Diagnosis present

## 2017-07-30 DIAGNOSIS — R569 Unspecified convulsions: Secondary | ICD-10-CM | POA: Diagnosis not present

## 2017-07-30 DIAGNOSIS — E1011 Type 1 diabetes mellitus with ketoacidosis with coma: Secondary | ICD-10-CM | POA: Diagnosis present

## 2017-07-30 DIAGNOSIS — R402132 Coma scale, eyes open, to sound, at arrival to emergency department: Secondary | ICD-10-CM | POA: Diagnosis present

## 2017-07-30 DIAGNOSIS — E872 Acidosis: Secondary | ICD-10-CM | POA: Diagnosis not present

## 2017-07-30 DIAGNOSIS — I959 Hypotension, unspecified: Secondary | ICD-10-CM | POA: Diagnosis present

## 2017-07-30 DIAGNOSIS — E1110 Type 2 diabetes mellitus with ketoacidosis without coma: Secondary | ICD-10-CM | POA: Diagnosis present

## 2017-07-30 DIAGNOSIS — E0810 Diabetes mellitus due to underlying condition with ketoacidosis without coma: Secondary | ICD-10-CM | POA: Diagnosis not present

## 2017-07-30 DIAGNOSIS — G9349 Other encephalopathy: Secondary | ICD-10-CM | POA: Diagnosis present

## 2017-07-30 DIAGNOSIS — Z833 Family history of diabetes mellitus: Secondary | ICD-10-CM | POA: Diagnosis not present

## 2017-07-30 DIAGNOSIS — Z0189 Encounter for other specified special examinations: Secondary | ICD-10-CM | POA: Diagnosis not present

## 2017-07-30 DIAGNOSIS — E0811 Diabetes mellitus due to underlying condition with ketoacidosis with coma: Secondary | ICD-10-CM | POA: Diagnosis not present

## 2017-07-30 LAB — URINALYSIS, ROUTINE W REFLEX MICROSCOPIC
BILIRUBIN URINE: NEGATIVE
Bacteria, UA: NONE SEEN
KETONES UR: 80 mg/dL — AB
Leukocytes, UA: NEGATIVE
Nitrite: NEGATIVE
PH: 5 (ref 5.0–8.0)
Protein, ur: 30 mg/dL — AB
SPECIFIC GRAVITY, URINE: 1.016 (ref 1.005–1.030)

## 2017-07-30 LAB — I-STAT CHEM 8, ED
BUN: 66 mg/dL — ABNORMAL HIGH (ref 6–20)
BUN: 47 mg/dL — ABNORMAL HIGH (ref 6–20)
CALCIUM ION: 0.99 mmol/L — AB (ref 1.15–1.40)
CALCIUM ION: 0.98 mmol/L — AB (ref 1.15–1.40)
CHLORIDE: 89 mmol/L — AB (ref 101–111)
CREATININE: 2.7 mg/dL — AB (ref 0.61–1.24)
Chloride: 97 mmol/L — ABNORMAL LOW (ref 101–111)
Creatinine, Ser: 2.5 mg/dL — ABNORMAL HIGH (ref 0.61–1.24)
HCT: 43 % (ref 39.0–52.0)
HEMATOCRIT: 41 % (ref 39.0–52.0)
HEMOGLOBIN: 14.6 g/dL (ref 13.0–17.0)
HEMOGLOBIN: 13.9 g/dL (ref 13.0–17.0)
POTASSIUM: 7.6 mmol/L — AB (ref 3.5–5.1)
Potassium: 5.9 mmol/L — ABNORMAL HIGH (ref 3.5–5.1)
SODIUM: 118 mmol/L — AB (ref 135–145)
SODIUM: 127 mmol/L — AB (ref 135–145)
TCO2: 6 mmol/L (ref 0–100)
TCO2: 6 mmol/L (ref 0–100)

## 2017-07-30 LAB — COMPREHENSIVE METABOLIC PANEL
ALT: 15 U/L — AB (ref 17–63)
AST: 23 U/L (ref 15–41)
Albumin: 3.9 g/dL (ref 3.5–5.0)
Alkaline Phosphatase: 134 U/L — ABNORMAL HIGH (ref 38–126)
BILIRUBIN TOTAL: 3.2 mg/dL — AB (ref 0.3–1.2)
BUN: 47 mg/dL — AB (ref 6–20)
CO2: <7 mmol/L — ABNORMAL LOW (ref 22–32)
CREATININE: 3.88 mg/dL — AB (ref 0.61–1.24)
Calcium: 8 mg/dL — ABNORMAL LOW (ref 8.9–10.3)
Chloride: 79 mmol/L — ABNORMAL LOW (ref 101–111)
GFR, EST AFRICAN AMERICAN: 22 mL/min — AB (ref >60)
GFR, EST NON AFRICAN AMERICAN: 19 mL/min — AB (ref >60)
Glucose, Bld: 1098 mg/dL (ref 65–99)
Potassium: 7 mmol/L (ref 3.5–5.1)
Sodium: 123 mmol/L — ABNORMAL LOW (ref 135–145)
TOTAL PROTEIN: 6.6 g/dL (ref 6.5–8.1)

## 2017-07-30 LAB — BASIC METABOLIC PANEL
ANION GAP: 28 — AB (ref 5–15)
ANION GAP: 12 (ref 5–15)
BUN: 40 mg/dL — ABNORMAL HIGH (ref 6–20)
BUN: 32 mg/dL — ABNORMAL HIGH (ref 6–20)
CALCIUM: 7.3 mg/dL — AB (ref 8.9–10.3)
CHLORIDE: 100 mmol/L — AB (ref 101–111)
CHLORIDE: 110 mmol/L (ref 101–111)
CO2: 8 mmol/L — AB (ref 22–32)
CO2: 17 mmol/L — AB (ref 22–32)
CREATININE: 1.78 mg/dL — AB (ref 0.61–1.24)
Calcium: 8.2 mg/dL — ABNORMAL LOW (ref 8.9–10.3)
Creatinine, Ser: 2.8 mg/dL — ABNORMAL HIGH (ref 0.61–1.24)
GFR calc non Af Amer: 28 mL/min — ABNORMAL LOW (ref >60)
GFR, EST AFRICAN AMERICAN: 33 mL/min — AB (ref >60)
GFR, EST AFRICAN AMERICAN: 57 mL/min — AB (ref >60)
GFR, EST NON AFRICAN AMERICAN: 49 mL/min — AB (ref >60)
Glucose, Bld: 439 mg/dL — ABNORMAL HIGH (ref 65–99)
Glucose, Bld: 182 mg/dL — ABNORMAL HIGH (ref 65–99)
POTASSIUM: 2.8 mmol/L — AB (ref 3.5–5.1)
Potassium: 3.8 mmol/L (ref 3.5–5.1)
Sodium: 136 mmol/L (ref 135–145)
Sodium: 139 mmol/L (ref 135–145)

## 2017-07-30 LAB — ETHANOL

## 2017-07-30 LAB — BETA-HYDROXYBUTYRIC ACID: Beta-Hydroxybutyric Acid: >8 mmol/L — ABNORMAL HIGH (ref 0.05–0.27)

## 2017-07-30 LAB — RAPID URINE DRUG SCREEN, HOSP PERFORMED
AMPHETAMINES: NOT DETECTED
BENZODIAZEPINES: NOT DETECTED
Barbiturates: NOT DETECTED
Cocaine: NOT DETECTED
OPIATES: NOT DETECTED
TETRAHYDROCANNABINOL: NOT DETECTED

## 2017-07-30 LAB — CBG MONITORING, ED: GLUCOSE-CAPILLARY: 583 mg/dL — AB (ref 65–99)

## 2017-07-30 LAB — CBC WITH DIFFERENTIAL/PLATELET
Basophils Absolute: 0 10*3/uL (ref 0.0–0.1)
Basophils Relative: 0 %
EOS ABS: 0 10*3/uL (ref 0.0–0.7)
Eosinophils Relative: 0 %
HCT: 40.7 % (ref 39.0–52.0)
Hemoglobin: 12.6 g/dL — ABNORMAL LOW (ref 13.0–17.0)
LYMPHS ABS: 1.6 10*3/uL (ref 0.7–4.0)
Lymphocytes Relative: 6 %
MCH: 29 pg (ref 26.0–34.0)
MCHC: 31 g/dL (ref 30.0–36.0)
MCV: 93.6 fL (ref 78.0–100.0)
MONOS PCT: 3 %
Monocytes Absolute: 0.8 10*3/uL (ref 0.1–1.0)
NEUTROS ABS: 23.7 10*3/uL — AB (ref 1.7–7.7)
NEUTROS PCT: 91 %
Other: 0 %
PLATELETS: 369 10*3/uL (ref 150–400)
RBC: 4.35 MIL/uL (ref 4.22–5.81)
RDW: 14.3 % (ref 11.5–15.5)
WBC: 26.1 10*3/uL — AB (ref 4.0–10.5)

## 2017-07-30 LAB — GLUCOSE, CAPILLARY
GLUCOSE-CAPILLARY: 128 mg/dL — AB (ref 65–99)
GLUCOSE-CAPILLARY: 140 mg/dL — AB (ref 65–99)
GLUCOSE-CAPILLARY: 158 mg/dL — AB (ref 65–99)
GLUCOSE-CAPILLARY: 193 mg/dL — AB (ref 65–99)
GLUCOSE-CAPILLARY: 279 mg/dL — AB (ref 65–99)
GLUCOSE-CAPILLARY: 346 mg/dL — AB (ref 65–99)
Glucose-Capillary: 428 mg/dL — ABNORMAL HIGH (ref 65–99)
Glucose-Capillary: 234 mg/dL — ABNORMAL HIGH (ref 65–99)
Glucose-Capillary: 211 mg/dL — ABNORMAL HIGH (ref 65–99)

## 2017-07-30 LAB — LACTIC ACID, PLASMA: Lactic Acid, Venous: 2.4 mmol/L (ref 0.5–1.9)

## 2017-07-30 LAB — I-STAT VENOUS BLOOD GAS, ED
ACID-BASE DEFICIT: 29 mmol/L — AB (ref 0.0–2.0)
Bicarbonate: 2.9 mmol/L — ABNORMAL LOW (ref 20.0–28.0)
O2 SAT: 97 %
PCO2 VEN: 15.2 mmHg — AB (ref 44.0–60.0)
PH VEN: 6.892 — AB (ref 7.250–7.430)
TCO2: <5 mmol/L (ref 0–100)
pO2, Ven: 155 mmHg — ABNORMAL HIGH (ref 32.0–45.0)

## 2017-07-30 LAB — PROCALCITONIN: PROCALCITONIN: 0.59 ng/mL

## 2017-07-30 LAB — MRSA PCR SCREENING: MRSA by PCR: NEGATIVE

## 2017-07-30 LAB — SALICYLATE LEVEL

## 2017-07-30 LAB — ACETAMINOPHEN LEVEL

## 2017-07-30 MED ORDER — HEPARIN SODIUM (PORCINE) 5000 UNIT/ML IJ SOLN
5000.0000 [IU] | Freq: Three times a day (TID) | INTRAMUSCULAR | Status: DC
  Administered 2017-07-30: 5000 [IU] via SUBCUTANEOUS
  Filled 2017-07-30 (×4): qty 1

## 2017-07-30 MED ORDER — PIPERACILLIN-TAZOBACTAM 3.375 G IVPB
3.3750 g | Freq: Three times a day (TID) | INTRAVENOUS | Status: DC
  Administered 2017-07-31: 3.375 g via INTRAVENOUS
  Filled 2017-07-30 (×4): qty 50

## 2017-07-30 MED ORDER — SODIUM CHLORIDE 0.9 % IV BOLUS (SEPSIS)
1000.0000 mL | Freq: Once | INTRAVENOUS | Status: AC
  Administered 2017-07-30: 1000 mL via INTRAVENOUS

## 2017-07-30 MED ORDER — POTASSIUM CHLORIDE 10 MEQ/100ML IV SOLN
10.0000 meq | INTRAVENOUS | Status: AC
  Administered 2017-07-30 – 2017-07-31 (×4): 10 meq via INTRAVENOUS
  Filled 2017-07-30 (×4): qty 100

## 2017-07-30 MED ORDER — SODIUM CHLORIDE 0.9 % IV SOLN: INTRAVENOUS | Status: DC

## 2017-07-30 MED ORDER — SODIUM CHLORIDE 0.9 % IV SOLN
INTRAVENOUS | Status: DC
  Administered 2017-07-30: 125 mL/h via INTRAVENOUS

## 2017-07-30 MED ORDER — DEXTROSE-NACL 5-0.45 % IV SOLN: INTRAVENOUS | Status: DC

## 2017-07-30 MED ORDER — HEPARIN SODIUM (PORCINE) 5000 UNIT/ML IJ SOLN: 5000.0000 [IU] | Freq: Three times a day (TID) | INTRAMUSCULAR | Status: DC

## 2017-07-30 MED ORDER — ORAL CARE MOUTH RINSE: 15.0000 mL | Freq: Two times a day (BID) | OROMUCOSAL | Status: DC

## 2017-07-30 MED ORDER — SODIUM CHLORIDE 0.9 % IV BOLUS (SEPSIS): 1000.0000 mL | Freq: Once | INTRAVENOUS | Status: DC

## 2017-07-30 MED ORDER — VANCOMYCIN HCL IN DEXTROSE 1-5 GM/200ML-% IV SOLN
1000.0000 mg | Freq: Once | INTRAVENOUS | Status: AC
  Administered 2017-07-30: 1000 mg via INTRAVENOUS
  Filled 2017-07-30: qty 200

## 2017-07-30 MED ORDER — FAMOTIDINE IN NACL 20-0.9 MG/50ML-% IV SOLN: 20.0000 mg | Freq: Two times a day (BID) | INTRAVENOUS | Status: DC

## 2017-07-30 MED ORDER — VANCOMYCIN HCL IN DEXTROSE 750-5 MG/150ML-% IV SOLN
750.0000 mg | Freq: Two times a day (BID) | INTRAVENOUS | Status: DC
  Administered 2017-07-30 – 2017-07-31 (×2): 750 mg via INTRAVENOUS
  Filled 2017-07-30 (×2): qty 150

## 2017-07-30 MED ORDER — SODIUM CHLORIDE 0.9 % IV SOLN
INTRAVENOUS | Status: DC
  Administered 2017-07-30: 5.4 [IU]/h via INTRAVENOUS
  Administered 2017-07-30: 16.2 [IU]/h via INTRAVENOUS
  Administered 2017-07-30: 11.4 [IU]/h via INTRAVENOUS
  Filled 2017-07-30 (×2): qty 1

## 2017-07-30 MED ORDER — PIPERACILLIN-TAZOBACTAM 3.375 G IVPB 30 MIN
3.3750 g | Freq: Once | INTRAVENOUS | Status: AC
  Administered 2017-07-30: 3.375 g via INTRAVENOUS
  Filled 2017-07-30: qty 50

## 2017-07-30 MED ORDER — SODIUM CHLORIDE 0.9 % IV BOLUS (SEPSIS)
500.0000 mL | Freq: Once | INTRAVENOUS | Status: AC
  Administered 2017-07-30: 1000 mL via INTRAVENOUS
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
## 2017-07-30 NOTE — ED Provider Notes (Signed)
[facility] DEPT Provider Note   CSN: [id number] Arrival date & time: 07/30/17  0830     History   Chief Complaint Chief Complaint  Patient presents with  . Altered Mental Status   Level V caveat: Altered mental status HPI [name] is a 31 y.o. male.  HPI Patient presents to the emergency room with altered mental status.  Patient has a history of poorly controlled diabetes and recurrent DKA. Patient was brought to the emergency room by EMS.  It is unclear who called EMS at this point. Patient is minimally responsive. He is unable to answer questions.  When EMS picked the patient up and noted that he was hypotensive and his blood sugar was high. The patient had an empty bottle of doxelin next to him.  He was given 500 mL of normal saline and his blood pressure improved. There is no change in his mental status. Past Medical History:  Diagnosis Date  . Anemia   . Anxiety   . Arthritis    "both hips; both shoulders" (06/06/2017)  . Chicken pox   . Childhood asthma   . Chronic pain   . Depression   . DKA (diabetic ketoacidoses) ([place]) 11/18/2014  . DKA (diabetic ketoacidoses) ([place]) 07/05/2017  . GERD (gastroesophageal reflux disease)   . Hypertension   . Migraine    "a few/year" (06/06/2017)  . Noncompliance with medication regimen   . Pneumonia    "several times" (06/06/2017)  . Polysubstance abuse   . Schizo affective schizophrenia ([place])   . Scoliosis   . Type I diabetes mellitus [facility])     Patient Active Problem List   Diagnosis Date Noted  . Normocytic anemia 07/05/2017  . Numbness of right hand 07/05/2017  . Elevated troponin I measurement   . Positive D dimer   . DKA, type 1, not at goal [facility]) 07/01/2017  . Acute kidney injury (nontraumatic) ([place])   . Medically noncompliant   . Hyperglycemia 06/16/2017  . Nausea & vomiting 06/15/2017  . Abdominal pain 06/15/2017  . Chest pain 06/07/2017  . GERD (gastroesophageal reflux disease) 06/07/2017  .  Substance abuse 05/30/2016  . Substance induced mood disorder ([place]) 05/30/2016  . Tobacco use disorder 12/24/2015  . DM hyperosmolarity type I, uncontrolled ([place]) 12/19/2015  . Generalized headache 07/22/2015  . Bilateral hip bursitis 05/26/2015  . Depression   . Generalized anxiety disorder 03/29/2015  . Undifferentiated schizophrenia ([place])   . Hip pain, bilateral 11/18/2014  . Dehydration 06/17/2012    Past Surgical History:  Procedure Laterality Date  . CARDIAC CATHETERIZATION  11/2016  . INCISION AND DRAINAGE ABSCESS Left 11/2011   "MRSA removed off my thumb"       Home Medications    Prior to Admission medications   Medication Sig Start Date End Date Taking? Authorizing Provider  amphetamine-dextroamphetamine (ADDERALL) 10 MG tablet Take 30 mg by mouth daily with breakfast.    [provider]  blood glucose meter kit and supplies KIT Dispense based on patient and insurance preference. Use up to four times daily as directed. (FOR ICD-9 250.00, 250.01). 06/18/17   Amin, Jeanella Flattery, MD  cephALEXin (KEFLEX) 500 MG capsule Take 1 capsule (500 mg total) by mouth 3 (three) times daily. 07/11/17   Wallene Huh, DPM  glucose blood test strip Use as instructed 06/18/17   Amin, Ankit Chirag, MD  insulin aspart (NOVOLOG FLEXPEN) 100 UNIT/ML FlexPen CBG 70 - 120: 0 units CBG 121 - 150: 2 units CBG  151 - 200: 3 units CBG 201 - 250: 5 units CBG 251 - 300: 8 units CBG 301 - 350: 11 units CBG 351 - 400: 15 units Patient taking differently: Inject 10 Units into the skin 3 (three) times daily with meals. CBG 70 - 120: 0 units CBG 121 - 150: 2 units CBG 151 - 200: 3 units CBG 201 - 250: 5 units CBG 251 - 300: 8 units CBG 301 - 350: 11 units CBG 351 - 400: 15 units 06/11/17   Hosie Poisson, MD  Insulin Detemir (LEVEMIR FLEXPEN) 100 UNIT/ML Pen Inject 45 Units into the skin daily. 07/06/17   Donne Hazel, MD  Insulin Pen Needle (PEN NEEDLES) 31G X 5 MM MISC 90 each by Does not  apply route 4 (four) times daily -  before meals and at bedtime. 06/11/17   Hosie Poisson, MD  Lancets (ACCU-CHEK MULTICLIX) lancets Use as directed 06/18/17   Amin, Jeanella Flattery, MD  LORazepam (ATIVAN PO) Take 1 tablet by mouth daily as needed (anxity).    [provider]  metoCLOPramide (REGLAN) 10 MG tablet Take 1 tablet (10 mg total) by mouth 3 (three) times daily with meals. 06/11/17 06/11/18  Hosie Poisson, MD  ondansetron (ZOFRAN ODT) 4 MG disintegrating tablet Take 1 tablet (4 mg total) by mouth every 8 (eight) hours as needed for nausea or vomiting. 06/19/17   Amin, Jeanella Flattery, MD  pantoprazole (PROTONIX) 40 MG tablet Take 1 tablet (40 mg total) by mouth daily. 06/10/17   Hosie Poisson, MD  traMADol (ULTRAM) 50 MG tablet Take 1-2 tablets (50-100 mg total) by mouth every 6 (six) hours as needed for moderate pain or severe pain. 07/06/17   Donne Hazel, MD  traZODone (DESYREL) 150 MG tablet Take 150 mg by mouth at bedtime.     [provider]    Family History Family History  Problem Relation Age of Onset  . Diabetes Mother     Social History Social History  Substance Use Topics  . Smoking status: Current Every Day Smoker    Packs/day: 0.50    Years: 9.00    Types: Cigarettes  . Smokeless tobacco: Never Used  . Alcohol use No     Allergies   Sulfa antibiotics   Review of Systems Review of Systems  Unable to perform ROS: Mental status change     Physical Exam Updated Vital Signs BP 112/67   Pulse (!) 120   Temp (!) 96.2 F (35.7 C) (Temporal)   Resp (!) 21   Ht 1.88 m ('6\' 2"'$ )   Wt 81.6 kg (180 lb)   SpO2 100%   BMI 23.11 kg/m   Physical Exam  Constitutional: He appears lethargic. He appears distressed.  HENT:  Head: Normocephalic and atraumatic.  Right Ear: External ear normal.  Left Ear: External ear normal.  Mucous membranes are dry  Eyes: Conjunctivae are normal. Right eye exhibits no discharge. Left eye exhibits no discharge. No  scleral icterus.  Neck: Neck supple. No tracheal deviation present.  Cardiovascular: Regular rhythm and intact distal pulses.  Tachycardia present.   Pulmonary/Chest: Effort normal and breath sounds normal. No stridor. No respiratory distress. He has no wheezes. He has no rales.  Abdominal: Soft. Bowel sounds are normal. He exhibits no distension. There is no tenderness. There is no rebound and no guarding.  Musculoskeletal: He exhibits no edema or tenderness.  Ulcerative lesion on the left heel, no purulent change, no surrounding erythema, base of the  wound is dirty  Neurological: He appears lethargic. No cranial nerve deficit (no facial droop, speech is garbled). He exhibits normal muscle tone. He displays no seizure activity. Coordination normal. GCS eye subscore is 3. GCS verbal subscore is 2. GCS motor subscore is 5.  Pt withdraws to painful to stimuli, all 4 extremities move, pt responds to questions after sternal rub but his speech is incomprehensible  Skin: Skin is warm and dry. No rash noted.  Psychiatric: He has a normal mood and affect.  Nursing note and vitals reviewed.    ED Treatments / Results  Labs (all labs ordered are listed, but only abnormal results are displayed) Labs Reviewed  CBC WITH DIFFERENTIAL/PLATELET - Abnormal; Notable for the following:       Result Value   WBC 26.1 (*)    Hemoglobin 12.6 (*)    Neutro Abs 23.7 (*)    All other components within normal limits  BETA-HYDROXYBUTYRIC ACID - Abnormal; Notable for the following:    Beta-Hydroxybutyric Acid >8.00 (*)    All other components within normal limits  ACETAMINOPHEN LEVEL - Abnormal; Notable for the following:    Acetaminophen (Tylenol), Serum <10 (*)    All other components within normal limits  COMPREHENSIVE METABOLIC PANEL - Abnormal; Notable for the following:    Sodium 123 (*)    Potassium 7.0 (*)    Chloride 79 (*)    CO2 <7 (*)    Glucose, Bld 1,098 (*)    BUN 47 (*)    Creatinine, Ser  3.88 (*)    Calcium 8.0 (*)    ALT 15 (*)    Alkaline Phosphatase 134 (*)    Total Bilirubin 3.2 (*)    GFR calc non Af Amer 19 (*)    GFR calc Af Amer 22 (*)    All other components within normal limits  CBG MONITORING, ED - Abnormal; Notable for the following:    Glucose-Capillary >600 (*)    All other components within normal limits  I-STAT CHEM 8, ED - Abnormal; Notable for the following:    Sodium 118 (*)    Potassium 7.6 (*)    Chloride 89 (*)    BUN 66 (*)    Creatinine, Ser 2.70 (*)    Glucose, Bld >700 (*)    Calcium, Ion 0.99 (*)    All other components within normal limits  I-STAT CHEM 8, ED - Abnormal; Notable for the following:    Sodium 127 (*)    Potassium 5.9 (*)    Chloride 97 (*)    BUN 47 (*)    Creatinine, Ser 2.50 (*)    Glucose, Bld >700 (*)    Calcium, Ion 0.98 (*)    All other components within normal limits  CBG MONITORING, ED - Abnormal; Notable for the following:    Glucose-Capillary >600 (*)    All other components within normal limits  CULTURE, BLOOD (ROUTINE X 2)  CULTURE, BLOOD (ROUTINE X 2)  ETHANOL  SALICYLATE LEVEL  URINALYSIS, ROUTINE W REFLEX MICROSCOPIC  URINALYSIS, COMPLETE (UACMP) WITH MICROSCOPIC  RAPID URINE DRUG SCREEN, HOSP PERFORMED  I-STAT VENOUS BLOOD GAS, ED  I-STAT CHEM 8, ED    EKG  EKG Interpretation  Date/Time:  Monday July 30 2017 08:33:23 EDT Ventricular Rate:  124 PR Interval:    QRS Duration: 109 QT Interval:  342 QTC Calculation: 492 R Axis:   -108 Text Interpretation:  sinus tachycardia Borderline ST elevation, anterior leads Prolonged QT interval  Probable RV involvement, suggest recording right precordial leads increased t wave amplitude since prior ECG Confirmed by Dorie Rank [phone]) on 07/30/2017 8:37:00 AM       Radiology Dg Chest Portable 1 View  Result Date: 07/30/2017 CLINICAL DATA:  Diabetic, abnormal glucose. EXAM: PORTABLE CHEST 1 VIEW COMPARISON:  07/04/2017. FINDINGS: Trachea is none.  Heart size normal. Lungs are clear. No pleural fluid. IMPRESSION: Negative. Electronically Signed   By: Lorin Picket M.D.   On: 07/30/2017 09:09    Procedures .Critical Care Performed by: Dorie Rank Authorized by: Dorie Rank   Critical care provider statement:    Critical care time (minutes):  35   Critical care was necessary to treat or prevent imminent or life-threatening deterioration of the following conditions:  Endocrine crisis   Critical care was time spent personally by me on the following activities:  Discussions with consultants, evaluation of patient's response to treatment, examination of patient, ordering and performing treatments and interventions, ordering and review of laboratory studies, ordering and review of radiographic studies, pulse oximetry, re-evaluation of patient's condition, obtaining history from patient or surrogate and review of old charts    (including critical care time)  Medications Ordered in ED Medications  sodium chloride 0.9 % bolus 1,000 mL (0 mLs Intravenous Stopped 07/30/17 0909)    And  sodium chloride 0.9 % bolus 1,000 mL (0 mLs Intravenous Stopped 07/30/17 0950)    And  0.9 %  sodium chloride infusion (125 mL/hr Intravenous New Bag/Given 07/30/17 0909)  insulin regular (NOVOLIN R,HUMULIN R) 100 Units in sodium chloride 0.9 % 100 mL (1 Units/mL) infusion (10.8 Units/hr Intravenous Rate/Dose Change 07/30/17 1105)  vancomycin (VANCOCIN) IVPB 1000 mg/200 mL premix (1,000 mg Intravenous New Bag/Given 07/30/17 1106)  sodium chloride 0.9 % bolus 500 mL (0 mLs Intravenous Stopped 07/30/17 1026)  piperacillin-tazobactam (ZOSYN) IVPB 3.375 g (0 g Intravenous Stopped 07/30/17 1103)     Initial Impression / Assessment and Plan / ED Course  I have reviewed the triage vital signs and the nursing notes.  Pertinent labs & imaging results that were available during my care of the patient were reviewed by me and considered in my medical decision making (see chart  for details).  Clinical Course as of Jul 30 1117  Mon Jul 30, 2017  0852 Suspicious for recurrent DKA.  Pt has a history of multiple prior episodes.  EKG suggests hyperkalemia.  Will give fluids, check electrolytes and then initiate insulin  [JK]  0910 Critically high. Will start insulin infusion Potassium: (!!) 7.6 [JK]  0911 Significantly elevated compared to previous value.  Consistent with acute kidney injury.  Most likely prerenal Creatinine: (!) 2.70 [JK]  0911 I-STAT chem 8 and i-STAT venous blood gas are consistent with recurrent DKA. Glucose: (!!) >700 [JK]  0935 BP is now low in the mid 80s.  Will continue fluid boluses.  Increased bun and creatinine and dka consistent with prerenal cause.  Doubt sepsis but will add blood cultures.  Monitor closely  [JK]  1003 Most recent bp 109/65.  Mental status is unchanged.  [JK]  1118 Discussed with Dr Lamonte Sakai.  Will admit to the ICU  [JK]    Clinical Course User Index [JK] Dorie Rank, MD    Patient presented to the emergency room with altered mental status. Patient has a history of DKA and unfortunately he seems to be a recurrent episode of diabetic ketoacidosis. Patient has acute kidney injury complicating his DKA. He also has a significant metabolic  acidosis with a venous pH of 6.916.  The patient has been hydrated with IV fluids. Insulin infusion started. We will continue to monitor closely, serial electrolyte panels. I'll consult the critical care service for admission as the patient remains Hypotensive and altered.  Final Clinical Impressions(s) / ED Diagnoses   Final diagnoses:  Diabetic ketoacidosis with coma associated with type 1 diabetes mellitus ([place])  AKI (acute kidney injury) ([place])  Hyperkalemia      Dorie Rank, MD 07/30/17 1118

## 2017-07-30 NOTE — Progress Notes (Signed)
DAREN YEAGLE - 31 y.o. male MRN [id number]  Date of birth: 14-Mar-1986  Office Visit Note: Visit Date: 07/27/2017 PCP: Fleet Contras, MD Referred by: Fleet Contras, MD  Subjective: Chief Complaint  Patient presents with  . Right Arm - Pain   HPI: Mr. Witucki is a 31 year old right-hand dominant gentleman with poorly controlled type 1 diabetes as well as schizoaffective disorder and a history of right shoulder pain with some numbness and tingling in the right hand. He reports the numbness is not quite as bad as it was and it feels like more pressure over the hand. He's had 3 weeks of the hand symptoms being worsened. He does not really notice any other paresthesias. He has not had prior carpal tunnel release or other electrodiagnostic studies. She does not carry a diagnosis of polyneuropathy.    ROS Otherwise per HPI.  Assessment & Plan: Visit Diagnoses:  1. Paresthesia of skin     Plan: No additional findings.  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   There is no significant electrodiagnostic evidence of any other brachial plexopathy or cervical radiculopathy.   Recommendations: 1.  Follow-up with referring physician. 2.  Continue current management of symptoms.    Meds & Orders: No orders of the defined types were placed in this encounter.   Orders Placed This Encounter  Procedures  . NCV with EMG (electromyography)    Follow-up: Return for Dr. Roda Shutters.  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   There is no significant electrodiagnostic evidence of any other brachial plexopathy or cervical  radiculopathy.   Recommendations: 1.  Follow-up with referring physician. 2.  Continue current management of symptoms.   Procedures: No procedures performed  EMG & NCV Findings: Evaluation of the right median motor nerve showed prolonged distal onset latency (4.6 ms), reduced amplitude (3.9 mV), and decreased conduction velocity (Elbow-Wrist, 42 m/s).  The right ulnar motor nerve showed prolonged distal onset latency (4.4 ms), decreased conduction velocity (B Elbow-Wrist, 42 m/s), and decreased conduction velocity (A Elbow-B Elbow, 40 m/s).  The right median (across palm) sensory nerve showed no response (Wrist) and no response (Palm).  The right radial sensory nerve showed prolonged distal peak latency (3.8 ms).  The right ulnar sensory nerve showed prolonged distal peak latency (5.0 ms), reduced amplitude (5.2 V), and decreased conduction velocity (Wrist-5th Digit, 28 m/s).    All examined muscles (as indicated in the following table) showed no evidence of electrical instability.    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   There is no significant electrodiagnostic evidence of any other brachial plexopathy or cervical radiculopathy.   Recommendations: 1.  Follow-up with referring physician. 2.  Continue current management of symptoms.     Nerve Conduction Studies Anti Sensory Summary Table   Stim Site NR Peak (ms) Norm Peak (ms) P-T Amp (V) Norm P-T Amp Site1 Site2 Delta-P (ms) Dist (cm) Vel (m/s) Norm Vel (m/s)  Right Median Acr Palm Anti Sensory (2nd Digit)  30.4C  Wrist *NR  <3.6  >10 Wrist Palm  0.0    Palm *NR  <2.0  Right Radial Anti Sensory (Base 1st Digit)  32.7C  Wrist    *3.8 <3.1 11.2  Wrist Base 1st Digit 3.8 0.0    Right Ulnar Anti Sensory (5th Digit)  31.2C  Wrist     *5.0 <3.7 *5.2 >15.0 Wrist 5th Digit 5.0 14.0 *28 >38   Motor Summary Table   Stim Site NR Onset (ms) Norm Onset (ms) O-P Amp (mV) Norm O-P Amp Site1 Site2 Delta-0 (ms) Dist (cm) Vel (m/s) Norm Vel (m/s)  Right Median Motor (Abd Poll Brev)  32.3C  Wrist    *4.6 <4.2 *3.9 >5 Elbow Wrist 5.6 23.5 *42 >50  Elbow    10.2  3.3         Right Ulnar Motor (Abd Dig Min)  31.7C  Wrist    *4.4 <4.2 3.4 >3 B Elbow Wrist 5.5 23.0 *42 >53  B Elbow    9.9  3.4  A Elbow B Elbow 2.4 9.5 *40 >53  A Elbow    12.3  3.4          EMG   Side Muscle Nerve Root Ins Act Fibs Psw Amp Dur Poly Recrt Int Dennie Bible Comment  Right Abd Poll Brev Median C8-T1 Nml Nml Nml Nml Nml 0 Nml Nml   Right 1stDorInt Ulnar C8-T1 Nml Nml Nml Nml Nml 0 Nml Nml   Right PronatorTeres Median C6-7 Nml Nml Nml Nml Nml 0 Nml Nml   Right Biceps Musculocut C5-6 Nml Nml Nml Nml Nml 0 Nml Nml   Right Deltoid Axillary C5-6 Nml Nml Nml Nml Nml 0 Nml Nml     Nerve Conduction Studies Anti Sensory Left/Right Comparison   Stim Site L Lat (ms) R Lat (ms) L-R Lat (ms) L Amp (V) R Amp (V) L-R Amp (%) Site1 Site2 L Vel (m/s) R Vel (m/s) L-R Vel (m/s)  Median Acr Palm Anti Sensory (2nd Digit)  30.4C  Wrist       Wrist Palm     Palm             Radial Anti Sensory (Base 1st Digit)  32.7C  Wrist  *3.8   11.2  Wrist Base 1st Digit     Ulnar Anti Sensory (5th Digit)  31.2C  Wrist  *5.0   *5.2  Wrist 5th Digit  *28    Motor Left/Right Comparison   Stim Site L Lat (ms) R Lat (ms) L-R Lat (ms) L Amp (mV) R Amp (mV) L-R Amp (%) Site1 Site2 L Vel (m/s) R Vel (m/s) L-R Vel (m/s)  Median Motor (Abd Poll Brev)  32.3C  Wrist  *4.6   *3.9  Elbow Wrist  *42   Elbow  10.2   3.3        Ulnar Motor (Abd Dig Min)  31.7C  Wrist  *4.4   3.4  B Elbow Wrist  *42   B Elbow  9.9   3.4  A Elbow B Elbow  *40   A Elbow  12.3   3.4           Waveforms:            Clinical History: No specialty comments available.  He reports that he has been smoking  Cigarettes.  He has a 4.50 pack-year smoking history. He has never used smokeless tobacco.   Recent Labs  06/16/17 1132  HGBA1C 10.8*    Objective:  VS:  HT:    WT:   BMI:     BP:  HR: bpm  TEMP: ( )  RESP:  Physical Exam  Musculoskeletal:  Inspection reveals atrophy of the bilateral APB or FDI or hand intrinsics. There is no swelling, color changes, allodynia or dystrophic changes. There is 4 out of 5 strength in the bilateral wrist extension, finger abduction and long finger flexion. There is a negative Hoffmann's test bilaterally.    Ortho Exam Imaging: No results found.  Past Medical/Family/Surgical/Social History: Medications & Allergies reviewed per EMR Patient Active Problem List   Diagnosis Date Noted  . Normocytic anemia 07/05/2017  . Numbness of right hand 07/05/2017  . Elevated troponin I measurement   . Positive D dimer   . DKA, type 1, not at goal [facility]) 07/01/2017  . Acute kidney injury (nontraumatic) ([name])   . Medically noncompliant   . Hyperglycemia 06/16/2017  . Nausea & vomiting 06/15/2017  . Abdominal pain 06/15/2017  . Chest pain 06/07/2017  . GERD (gastroesophageal reflux disease) 06/07/2017  . Substance abuse 05/30/2016  . Substance induced mood disorder ([name]) 05/30/2016  . Tobacco use disorder 12/24/2015  . DM hyperosmolarity type I, uncontrolled ([name]) 12/19/2015  . Generalized headache 07/22/2015  . Bilateral hip bursitis 05/26/2015  . Depression   . Generalized anxiety disorder 03/29/2015  . Undifferentiated schizophrenia ([name])   . Hip pain, bilateral 11/18/2014  . Dehydration 06/17/2012   Past Medical History:  Diagnosis Date  . Anemia   . Anxiety   . Arthritis    "both hips; both shoulders" (06/06/2017)  . Chicken pox   . Childhood asthma   . Chronic pain   . Depression   . DKA (diabetic ketoacidoses) ([name]) 11/18/2014  . DKA (diabetic ketoacidoses) ([name]) 07/05/2017  . GERD (gastroesophageal reflux disease)   . Hypertension   .  Migraine    "a few/year" (06/06/2017)  . Noncompliance with medication regimen   . Pneumonia    "several times" (06/06/2017)  . Polysubstance abuse   . Schizo affective schizophrenia ([name])   . Scoliosis   . Type I diabetes mellitus ([name])    Family History  Problem Relation Age of Onset  . Diabetes Mother    Past Surgical History:  Procedure Laterality Date  . CARDIAC CATHETERIZATION  11/2016  . INCISION AND DRAINAGE ABSCESS Left 11/2011   "MRSA removed off my thumb"   Social History   Occupational History  . Disability    Social History Main Topics  . Smoking status: Current Every Day Smoker    Packs/day: 0.50    Years: 9.00    Types: Cigarettes  . Smokeless tobacco: Never Used  . Alcohol use No  . Drug use: Yes    Types: Marijuana, Cocaine, Methamphetamines, "Crack" cocaine     Comment: 06/06/2017 "nothing in the last 5 years"  . Sexual activity: Not Currently

## 2017-07-30 NOTE — Progress Notes (Signed)
Mother [name] called from [place] [phone] call ed to check on condition of her son. The patient has no family on [place] and she wanted an update.

## 2017-07-30 NOTE — Progress Notes (Signed)
eLink Physician-Brief Progress Note Patient Name: CARLOS HEBER DOB: [birth year] MRN: [id number]   Date of Service  07/30/2017  HPI/Events of Note  K+ = 2.8 and Creatinine = 1.78.   eICU Interventions  Will replace K+.     Intervention Category Major Interventions: Electrolyte abnormality - evaluation and management  Apryl Brymer Eugene 07/30/2017, 10:09 PM

## 2017-07-30 NOTE — Procedures (Signed)
EMG & NCV Findings: Evaluation of the right median motor nerve showed prolonged distal onset latency (4.6 ms), reduced amplitude (3.9 mV), and decreased conduction velocity (Elbow-Wrist, 42 m/s).  The right ulnar motor nerve showed prolonged distal onset latency (4.4 ms), decreased conduction velocity (B Elbow-Wrist, 42 m/s), and decreased conduction velocity (A Elbow-B Elbow, 40 m/s).  The right median (across palm) sensory nerve showed no response (Wrist) and no response (Palm).  The right radial sensory nerve showed prolonged distal peak latency (3.8 ms).  The right ulnar sensory nerve showed prolonged distal peak latency (5.0 ms), reduced amplitude (5.2 V), and decreased conduction velocity (Wrist-5th Digit, 28 m/s).    All examined muscles (as indicated in the following table) showed no evidence of electrical instability.    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   There is no significant electrodiagnostic evidence of any other brachial plexopathy or cervical radiculopathy.   Recommendations: 1.  Follow-up with referring physician. 2.  Continue current management of symptoms.     Nerve Conduction Studies Anti Sensory Summary Table   Stim Site NR Peak (ms) Norm Peak (ms) P-T Amp (V) Norm P-T Amp Site1 Site2 Delta-P (ms) Dist (cm) Vel (m/s) Norm Vel (m/s)  Right Median Acr Palm Anti Sensory (2nd Digit)  30.4C  Wrist *NR  <3.6  >10 Wrist Palm  0.0    Palm *NR  <2.0          Right Radial Anti Sensory (Base 1st Digit)  32.7C  Wrist    *3.8 <3.1 11.2  Wrist Base 1st Digit 3.8 0.0    Right Ulnar Anti Sensory (5th Digit)  31.2C  Wrist    *5.0 <3.7 *5.2 >15.0 Wrist 5th Digit 5.0 14.0 *28 >38   Motor Summary Table   Stim Site NR Onset (ms) Norm Onset (ms) O-P Amp (mV) Norm O-P Amp Site1 Site2  Delta-0 (ms) Dist (cm) Vel (m/s) Norm Vel (m/s)  Right Median Motor (Abd Poll Brev)  32.3C  Wrist    *4.6 <4.2 *3.9 >5 Elbow Wrist 5.6 23.5 *42 >50  Elbow    10.2  3.3         Right Ulnar Motor (Abd Dig Min)  31.7C  Wrist    *4.4 <4.2 3.4 >3 B Elbow Wrist 5.5 23.0 *42 >53  B Elbow    9.9  3.4  A Elbow B Elbow 2.4 9.5 *40 >53  A Elbow    12.3  3.4          EMG   Side Muscle Nerve Root Ins Act Fibs Psw Amp Dur Poly Recrt Int Dennie Bible Comment  Right Abd Poll Brev Median C8-T1 Nml Nml Nml Nml Nml 0 Nml Nml   Right 1stDorInt Ulnar C8-T1 Nml Nml Nml Nml Nml 0 Nml Nml   Right PronatorTeres Median C6-7 Nml Nml Nml Nml Nml 0 Nml Nml   Right Biceps Musculocut C5-6 Nml Nml Nml Nml Nml 0 Nml Nml   Right Deltoid Axillary C5-6 Nml Nml Nml Nml Nml 0 Nml Nml     Nerve Conduction Studies Anti Sensory Left/Right Comparison   Stim Site L Lat (ms) R Lat (ms) L-R Lat (ms) L Amp (V) R Amp (V) L-R Amp (%) Site1 Site2 L Vel (m/s) R Vel (m/s) L-R Vel (m/s)  Median Acr Palm Anti Sensory (2nd Digit)  30.4C  Wrist  Wrist Palm     Palm             Radial Anti Sensory (Base 1st Digit)  32.7C  Wrist  *3.8   11.2  Wrist Base 1st Digit     Ulnar Anti Sensory (5th Digit)  31.2C  Wrist  *5.0   *5.2  Wrist 5th Digit  *28    Motor Left/Right Comparison   Stim Site L Lat (ms) R Lat (ms) L-R Lat (ms) L Amp (mV) R Amp (mV) L-R Amp (%) Site1 Site2 L Vel (m/s) R Vel (m/s) L-R Vel (m/s)  Median Motor (Abd Poll Brev)  32.3C  Wrist  *4.6   *3.9  Elbow Wrist  *42   Elbow  10.2   3.3        Ulnar Motor (Abd Dig Min)  31.7C  Wrist  *4.4   3.4  B Elbow Wrist  *42   B Elbow  9.9   3.4  A Elbow B Elbow  *40   A Elbow  12.3   3.4           Waveforms:

## 2017-07-30 NOTE — ED Notes (Signed)
Allowed charge nurse to complete cbg.

## 2017-07-30 NOTE — Progress Notes (Signed)
Pharmacy Antibiotic Note  [name] is a 31 y.o. male admitted on 07/30/2017 with sepsis.  Pharmacy has been consulted for vancomycin and Zosyn dosing. WBC 26.1, Temp 26.2, Scr 2.5, CrCl ~49, venous pH 6.9  Plan: Vancomycin 750mg  IV every 12 hours.  Goal trough 15-20 mcg/mL. Zosyn 3.375g IV q8h (4 hour infusion). Monitor clinical progression and LOT  Height: 6\' 2"  (188 cm) Weight: 180 lb (81.6 kg) IBW/kg (Calculated) : 82.2  Temp (24hrs), Avg:96.2 F (35.7 C), Min:96.2 F (35.7 C), Max:96.2 F (35.7 C)   Recent Labs Lab 07/30/17 0846 07/30/17 0903 07/30/17 1114  WBC 26.1*  --   --   CREATININE 3.88* 2.70* 2.50*    Estimated Creatinine Clearance: 49 mL/min (A) (by C-G formula based on SCr of 2.5 mg/dL (H)).    Allergies  Allergen Reactions  . Sulfa Antibiotics Other (See Comments)    Unknown childhood allergy   Antimicrobials this admission: 8/13 vancomycin>> 8/13 Zosyn>>  Dose adjustments this admission:  Microbiology results:  Thank you for allowing pharmacy to be a part of this patient's care.  9/13 [name] 07/30/2017 1:18 PM

## 2017-07-30 NOTE — ED Notes (Signed)
Pt refusing to have CBG done

## 2017-07-30 NOTE — ED Notes (Signed)
Critical Lab results given to Dr. Lynelle Doctor.

## 2017-07-30 NOTE — ED Notes (Addendum)
Pt more awake-- requesting ice chips-- speech slightly garbled-- mouth dry--  Will use urinal- refuses to have condom cath placed.

## 2017-07-30 NOTE — Clinical Social Work Note (Signed)
Clinical Social Work Assessment  Patient Details  Name: [name] MRN: [id number] Date of Birth: [birth year]  Date of referral:  07/30/17               Reason for consult:  Medication Concerns (pt has diabetes. )                Permission sought to share information with:  Family Supports Permission granted to share information::  Yes, Verbal Permission Granted  Name::     Marlin Canary '[name]"   Agency::     Relationship::     Contact Information:     Housing/Transportation Living arrangements for the past 2 months:  [employer] (pt has been renting  a room.) Source of Information:  Parent (pt's mother ) Patient Interpreter Needed:  None Criminal Activity/Legal Involvement Pertinent to Current Situation/Hospitalization:    Significant Relationships:  Parents Lives with:  Other (Comment) (pt lives with another gentleman in whoich pt is renting a room from him. ) Do you feel safe going back to the place where you live?  Yes Need for family participation in patient care:  Yes (Comment)  Care giving concerns:  CSW spoke with pt's mother ([name]) due to pt not responding to CSW questions due to bring tired. CSW wasn't informed of any concerns at this time regarding pt's care.    Social Worker assessment / plan:  CSW attempted to speak with pt at bedside, however in attempting to speak with pt, pt was asleep. CSW tried to wake pt through asking questions, however pt kept falling asleep. CSW decided to contact pt's mother ([name]) to obtain information needed. CSW was informed that pt has been renting a room with a guy. Pt has been living in this situation since pt was released from jail earlier this year. CSW was informed by [name] that she lives out state and pt is unable to leave the state of [place] due to being on probation from jail. Pt is said to have the support of mom, but other support for pt are limited at this time.    Employment status:  Other (Comment) Insurance  information:  Medicare PT Recommendations:  Not assessed at this time Information / Referral to community resources:     Patient/Family's Response to care:  Pt's mother presented some questions regarding pt care, however CSW suggested that she contact the RN on the unit to discuss pt's progress at this time.   Patient/Family's Understanding of and Emotional Response to Diagnosis, Current Treatment, and Prognosis:  No further questions or concerns have been presented at this time.   Emotional Assessment Appearance:  Appears stated age Attitude/Demeanor/Rapport:  Unable to Assess (pt was asleep.) Affect (typically observed):  Unable to Assess Orientation:  Oriented to Self, Oriented to Place, Oriented to  Time, Oriented to Situation Alcohol / Substance use:  Tobacco Use Psych involvement (Current and /or in the community):  No (Comment)  Discharge Needs  Concerns to be addressed:  No discharge needs identified Readmission within the last 30 days:  No Current discharge risk:  None Barriers to Discharge:      Robb Matar, LCSWA 07/30/2017, 2:59 PM

## 2017-07-30 NOTE — H&P (Signed)
PULMONARY / CRITICAL CARE MEDICINE   Name: [name] MRN: [id number] DOB: 14-Aug-1986    ADMISSION DATE:  07/30/2017 CONSULTATION DATE:  07/30/2017  REFERRING MD:  Dr. Tomi Bamberger EDP  CHIEF COMPLAINT:  DKA  HISTORY OF PRESENT ILLNESS:  Patient is encephalopathic and therefore HPI obtained from medical chart review. 31 year male with PMH significant for poorly controlled type 1 diabetes, non-compliance, recurrent DKA, schizoaffective disorder, substance abuse, and anemia who presented with altered mental status from home.    Patient has recently been seen as outpatient for chronic right shoulder pain treated with joint injection and chronic left foot wound treated with cephalexin on 7/25.  Patient recently discharged on 7/20 for DKA.    Presented by EMS hypotensive and hyperglycemic. Patient found by roommate and empty bottle of ? "doxelin".  Hypotension responsive to IV fluids.  Initially in ED, patient minimally responsive.  WBC 26.1 with left shift, Hgb 12.6, beta-hydroxybutyric acid >8, Na 123, K 7.9, glucose 1098, BUN 47, sCr 3.38, CXR neg, EKG with ST.  Emperic vancomycin and zosyn started.  Placed on insulin gtt and given NS bolus 2500 ml.  PCCM to admit.   PAST MEDICAL HISTORY :  He  has a past medical history of Anemia; Anxiety; Arthritis; Chicken pox; Childhood asthma; Chronic pain; Depression; DKA (diabetic ketoacidoses) ([place]) (11/18/2014); DKA (diabetic ketoacidoses) ([place]) (07/05/2017); GERD (gastroesophageal reflux disease); Hypertension; Migraine; Noncompliance with medication regimen; Pneumonia; Polysubstance abuse; Schizo affective schizophrenia ([place]); Scoliosis; and Type I diabetes mellitus ([place]).  PAST SURGICAL HISTORY: He  has a past surgical history that includes Incision and drainage abscess (Left, 11/2011) and Cardiac catheterization (11/2016).  Allergies  Allergen Reactions  . Sulfa Antibiotics Other (See Comments)    Unknown childhood allergy    No current  facility-administered medications on file prior to encounter.    Current Outpatient Prescriptions on File Prior to Encounter  Medication Sig  . amphetamine-dextroamphetamine (ADDERALL) 10 MG tablet Take 30 mg by mouth daily with breakfast.  . blood glucose meter kit and supplies KIT Dispense based on patient and insurance preference. Use up to four times daily as directed. (FOR ICD-9 250.00, 250.01).  . cephALEXin (KEFLEX) 500 MG capsule Take 1 capsule (500 mg total) by mouth 3 (three) times daily.  [name] glucose blood test strip Use as instructed  . insulin aspart (NOVOLOG FLEXPEN) 100 UNIT/ML FlexPen CBG 70 - 120: 0 units CBG 121 - 150: 2 units CBG 151 - 200: 3 units CBG 201 - 250: 5 units CBG 251 - 300: 8 units CBG 301 - 350: 11 units CBG 351 - 400: 15 units (Patient taking differently: Inject 10 Units into the skin 3 (three) times daily with meals. CBG 70 - 120: 0 units CBG 121 - 150: 2 units CBG 151 - 200: 3 units CBG 201 - 250: 5 units CBG 251 - 300: 8 units CBG 301 - 350: 11 units CBG 351 - 400: 15 units)  . Insulin Detemir (LEVEMIR FLEXPEN) 100 UNIT/ML Pen Inject 45 Units into the skin daily.  . Insulin Pen Needle (PEN NEEDLES) 31G X 5 MM MISC 90 each by Does not apply route 4 (four) times daily -  before meals and at bedtime.  . Lancets (ACCU-CHEK MULTICLIX) lancets Use as directed  . LORazepam (ATIVAN PO) Take 1 tablet by mouth daily as needed (anxity).  . metoCLOPramide (REGLAN) 10 MG tablet Take 1 tablet (10 mg total) by mouth 3 (three) times daily with meals.  . ondansetron [facility]  ODT) 4 MG disintegrating tablet Take 1 tablet (4 mg total) by mouth every 8 (eight) hours as needed for nausea or vomiting.  . pantoprazole (PROTONIX) 40 MG tablet Take 1 tablet (40 mg total) by mouth daily.  . traMADol (ULTRAM) 50 MG tablet Take 1-2 tablets (50-100 mg total) by mouth every 6 (six) hours as needed for moderate pain or severe pain.  . traZODone (DESYREL) 150 MG tablet Take 150 mg by  mouth at bedtime.   . [DISCONTINUED] citalopram (CELEXA) 20 MG tablet Take 20 mg by mouth daily.  . [DISCONTINUED] FLUoxetine (PROZAC) 20 MG tablet Take 20 mg by mouth every morning.  . [DISCONTINUED] insulin NPH-regular Human (NOVOLIN 70/30) (70-30) 100 UNIT/ML injection Inject 40 Units into the skin 2 (two) times daily with a meal.  . [DISCONTINUED] OLANZapine zydis (ZYPREXA) 10 MG disintegrating tablet Take 10 mg by mouth 2 (two) times daily.  . [DISCONTINUED] sertraline (ZOLOFT) 50 MG tablet Take 50 mg by mouth daily. For depression. Just started med, have not picked up yet [facility] [address]    FAMILY HISTORY:  His indicated that his mother is alive.    SOCIAL HISTORY: He  reports that he has been smoking Cigarettes.  He has a 4.50 pack-year smoking history. He has never used smokeless tobacco. He reports that he uses drugs, including Marijuana, Cocaine, Methamphetamines, and "Crack" cocaine. He reports that he does not drink alcohol.  REVIEW OF SYSTEMS:  Unable to assess  SUBJECTIVE:  Patient with much improved mental status since initial presentation.  Still unable to answer most questions, yells out, demanding ice.    VITAL SIGNS: BP 112/67   Pulse (!) 120   Temp (!) 96.2 F (35.7 C) (Temporal)   Resp (!) 21   Ht '6\' 2"'$  (1.88 m)   Wt 180 lb (81.6 kg)   SpO2 100%   BMI 23.11 kg/m   HEMODYNAMICS:   VENTILATOR SETTINGS:   INTAKE / OUTPUT: No intake/output data recorded.  PHYSICAL EXAMINATION: General:  Thin adult male lying in ED in NAD, uncooperative at times demanding ice HEENT: MM pink/dry, poor dentition Neuro: Awake, oriented to self, follows commands, MAE CV: ST 120, rrr, no m/r/g PULM: even/deep/non-labored, lungs bilaterally clear EU:MPNT, non-tender, bsx4 active  Extremities: warm/dry, no edema  Skin: no rashes. Left heel with dry wound, mild surround erythema, no drainage or warmth  LABS:  BMET  Recent Labs Lab 07/30/17 0846 07/30/17 0903  07/30/17 1114  NA 123* 118* 127*  K 7.0* 7.6* 5.9*  CL 79* 89* 97*  CO2 <7*  --   --   BUN 47* 66* 47*  CREATININE 3.88* 2.70* 2.50*  GLUCOSE 1,098* >700* >700*    Electrolytes  Recent Labs Lab 07/30/17 0846  CALCIUM 8.0*    CBC  Recent Labs Lab 07/30/17 0846 07/30/17 0903 07/30/17 1114  WBC 26.1*  --   --   HGB 12.6* 14.6 13.9  HCT 40.7 43.0 41.0  PLT 369  --   --     Coag's No results for input(s): APTT, INR in the last 168 hours.  Sepsis Markers No results for input(s): LATICACIDVEN, PROCALCITON, O2SATVEN in the last 168 hours.  ABG No results for input(s): PHART, PCO2ART, PO2ART in the last 168 hours.  Liver Enzymes  Recent Labs Lab 07/30/17 0846  AST 23  ALT 15*  ALKPHOS 134*  BILITOT 3.2*  ALBUMIN 3.9    Cardiac Enzymes No results for input(s): TROPONINI, PROBNP in the last 168  hours.  Glucose  Recent Labs Lab 07/30/17 0838 07/30/17 1100  GLUCAP >600* >600*    Imaging Dg Chest Portable 1 View  Result Date: 07/30/2017 CLINICAL DATA:  Diabetic, abnormal glucose. EXAM: PORTABLE CHEST 1 VIEW COMPARISON:  07/04/2017. FINDINGS: Trachea is none. Heart size normal. Lungs are clear. No pleural fluid. IMPRESSION: Negative. Electronically Signed   By: Lorin Picket M.D.   On: 07/30/2017 09:09     STUDIES:  8/13 CXR >> neg  CULTURES: 8/13 BC >>  ANTIBIOTICS: 8/13 Vancomycin >> 8/13 Zosyn >>  SIGNIFICANT EVENTS: 8/13 Admit  LINES/TUBES: PIV x 3  DISCUSSION: 22 yoM with PMH of IDDM, recurrent DKA, noncompliance who presented with AMS in DKA.    ASSESSMENT / PLAN:  PULMONARY A: No acute issues -CXR neg P:   O2 as needed for sats > 94% Pulmonary hygiene  CARDIOVASCULAR A:  ST Hypotension- fluid responsive, secondary to dehydration Prolonged Qtc .492 - appears chronic P:  Tele monitoring Goal map >65 Tend lactate  RENAL A:   AKI- previous sCr 0.9 Hyperkalemia- related to acidosis, improving AG/ Metabolic  acidosis r/t DKA P:   1L bolus now then NS at 13m/hr, change IVF to include dextrose with glucose <250 Bmets q 4hr per DKA protocol Trend BMP / urinary output Replace electrolytes as indicated Avoid nephrotoxic agents, ensure adequate renal perfusion  GASTROINTESTINAL A:   Hyperbilirubinemia Elevated Alk phos  - ETOH neg, salicylate neg P:   NPO for now with AMS Pepcid for SUP Trend LFTS  HEMATOLOGIC A:   Hx anemia- stable P:  Trend CBC SCD and Heparin SQ for VTE prophylaxis   INFECTIOUS A:   Leukocytosis with left shift Chronic left foot wound P:   Follow cultures Empiric vanc and zosyn, narrow as able Trend PCT Xray left foot r/o osteomyelitis  UA pending  ENDOCRINE A:   IDDM Recurrent DKA Noncompliance - follow AG - Beta hydroxybutyric acid >8 P:   Insulin gtt per DKA protocol Diabetes coordinator consult Consider CSW to assist for recurrent admissions Assess HA1c  NEUROLOGIC A:   Metabolic encephalopathy- related to DKA Hx of schizoaffective, anxiety, depression -? Empty bottle of "doxelin"- could be doxepin in which patient was taken off of in July.  QRS wnl.   P:   Monitor QRS/ Qtc Hold all sedation, pre+-admit meds Consider pysch eval when AMS improves   FAMILY  - Updates: No family at bedside.    - Inter-disciplinary family meet or Palliative Care meeting due by:  08/06/17  CCT 45 mins  BKennieth Rad AGACNP-BC [facility] Pgr: [phone] if no answer [phone]/13/2018, 12:52 PM  Attending Note:  I have examined patient, reviewed labs, studies and notes. I have discussed the case with B Simpson, and I agree with the data and plans as amended above. 31year old man with poorly controlled type 1 diabetes due to medical noncompliance, schizoaffective disorder, substance abuse, admitted from the emergency department with recurrent severe DKA. He was found by a roommate poorly responsive, confused on 8/13. Reportedly  there was an empty bottle of some medication (? his former tricyclic) near him. In the emergency department he was hypotensive, poorly responsive, hyperglycemic with a severe metabolic acidosis consistent with DKA. Lab review also shows leukocytosis. On my evaluation he will wake to voice, is confused, mumbles and is occasionally comprehensible. Thin man, ill-appearing. Lungs clear bilaterally. Heart tachycardic without a murmur abdomen, soft with positive bowel sounds. He has a chronic left heel  wound that appears clean and dry. Presentation is consistent with DKA, likely due to medical noncompliance. Consider possible infection as a precipitating event. No clear source. He has received empiric antibiotics, we will continue with low threshold to stop over the next 24 hours if no evidence of active infection. Unclear significance of the empty pill bottle found near him. Suspect that this was doxepin based on description. His ECG has a normal QRS. We will continue to follow his telemetry and ECG. Consider psychiatry consult, especially if we are able to establish an intentional ingestion or if any evidence for suicidal ideation. He has left AMA on many occasions in the past. Clearly he needs outpatient coordination of care and diabetic teaching. Independent critical care time is 40 minutes.   Baltazar Apo, MD, PhD 07/30/2017, 3:29 PM [facility] [phone] or if no answer [phone]

## 2017-07-30 NOTE — ED Notes (Signed)
Pt was found with empty bottle of doxipin at home per EMS -- pt has psych hx of schizophrenia--

## 2017-07-31 DIAGNOSIS — E875 Hyperkalemia: Secondary | ICD-10-CM

## 2017-07-31 DIAGNOSIS — N179 Acute kidney failure, unspecified: Secondary | ICD-10-CM

## 2017-07-31 DIAGNOSIS — E1011 Type 1 diabetes mellitus with ketoacidosis with coma: Principal | ICD-10-CM

## 2017-07-31 LAB — BASIC METABOLIC PANEL
ANION GAP: 9 (ref 5–15)
Anion gap: 9 (ref 5–15)
BUN: 23 mg/dL — ABNORMAL HIGH (ref 6–20)
BUN: 29 mg/dL — ABNORMAL HIGH (ref 6–20)
CALCIUM: 7.8 mg/dL — AB (ref 8.9–10.3)
CHLORIDE: 109 mmol/L (ref 101–111)
CHLORIDE: 111 mmol/L (ref 101–111)
CO2: 20 mmol/L — AB (ref 22–32)
CO2: 21 mmol/L — AB (ref 22–32)
CREATININE: 1.28 mg/dL — AB (ref 0.61–1.24)
CREATININE: 1.45 mg/dL — AB (ref 0.61–1.24)
Calcium: 8 mg/dL — ABNORMAL LOW (ref 8.9–10.3)
GFR calc non Af Amer: >60 mL/min (ref >60)
GFR calc non Af Amer: >60 mL/min (ref >60)
Glucose, Bld: 101 mg/dL — ABNORMAL HIGH (ref 65–99)
Glucose, Bld: 132 mg/dL — ABNORMAL HIGH (ref 65–99)
POTASSIUM: 3.5 mmol/L (ref 3.5–5.1)
Potassium: 3.3 mmol/L — ABNORMAL LOW (ref 3.5–5.1)
SODIUM: 140 mmol/L (ref 135–145)
Sodium: 139 mmol/L (ref 135–145)

## 2017-07-31 LAB — CBC
HEMATOCRIT: 30.3 % — AB (ref 39.0–52.0)
Hemoglobin: 10.9 g/dL — ABNORMAL LOW (ref 13.0–17.0)
MCH: 29.6 pg (ref 26.0–34.0)
MCHC: 36 g/dL (ref 30.0–36.0)
MCV: 82.3 fL (ref 78.0–100.0)
Platelets: 264 10*3/uL (ref 150–400)
RBC: 3.68 MIL/uL — ABNORMAL LOW (ref 4.22–5.81)
RDW: 14.7 % (ref 11.5–15.5)
WBC: 15 10*3/uL — ABNORMAL HIGH (ref 4.0–10.5)

## 2017-07-31 LAB — POCT I-STAT, CHEM 8
BUN: 66 mg/dL — AB (ref 6–20)
CALCIUM ION: 0.99 mmol/L — AB (ref 1.15–1.40)
CHLORIDE: 89 mmol/L — AB (ref 101–111)
CREATININE: 2.7 mg/dL — AB (ref 0.61–1.24)
HEMATOCRIT: 43 % (ref 39.0–52.0)
Hemoglobin: 14.6 g/dL (ref 13.0–17.0)
Potassium: 7.6 mmol/L (ref 3.5–5.1)
Sodium: 118 mmol/L — CL (ref 135–145)
TCO2: 6 mmol/L (ref 0–100)

## 2017-07-31 LAB — HEPATIC FUNCTION PANEL
ALK PHOS: 98 U/L (ref 38–126)
ALT: 10 U/L — AB (ref 17–63)
AST: 8 U/L — AB (ref 15–41)
Albumin: 3.2 g/dL — ABNORMAL LOW (ref 3.5–5.0)
Bilirubin, Direct: <0.1 mg/dL — ABNORMAL LOW (ref 0.1–0.5)
TOTAL PROTEIN: 5.5 g/dL — AB (ref 6.5–8.1)
Total Bilirubin: 0.9 mg/dL (ref 0.3–1.2)

## 2017-07-31 LAB — GLUCOSE, CAPILLARY
GLUCOSE-CAPILLARY: 101 mg/dL — AB (ref 65–99)
GLUCOSE-CAPILLARY: 204 mg/dL — AB (ref 65–99)
Glucose-Capillary: 129 mg/dL — ABNORMAL HIGH (ref 65–99)
Glucose-Capillary: 119 mg/dL — ABNORMAL HIGH (ref 65–99)
Glucose-Capillary: 143 mg/dL — ABNORMAL HIGH (ref 65–99)

## 2017-07-31 LAB — HEMOGLOBIN A1C
Hgb A1c MFr Bld: 9.5 % — ABNORMAL HIGH (ref 4.8–5.6)
Mean Plasma Glucose: 226 mg/dL

## 2017-07-31 LAB — PROCALCITONIN: Procalcitonin: 0.71 ng/mL

## 2017-07-31 MED ORDER — ACETAMINOPHEN 325 MG PO TABS
650.0000 mg | ORAL_TABLET | ORAL | Status: DC | PRN
  Filled 2017-07-31: qty 2

## 2017-07-31 MED ORDER — TRAMADOL HCL 50 MG PO TABS: 50.0000 mg | ORAL_TABLET | Freq: Four times a day (QID) | ORAL | Status: DC | PRN

## 2017-07-31 MED ORDER — INSULIN ASPART 100 UNIT/ML ~~LOC~~ SOLN
2.0000 [IU] | SUBCUTANEOUS | Status: DC
  Administered 2017-07-31 (×2): 2 [IU] via SUBCUTANEOUS
  Administered 2017-07-31: 6 [IU] via SUBCUTANEOUS

## 2017-07-31 MED ORDER — TRAMADOL HCL 50 MG PO TABS
50.0000 mg | ORAL_TABLET | Freq: Four times a day (QID) | ORAL | Status: DC | PRN
  Administered 2017-07-31: 50 mg via ORAL
  Filled 2017-07-31: qty 1

## 2017-07-31 MED ORDER — INSULIN GLARGINE 100 UNIT/ML ~~LOC~~ SOLN
20.0000 [IU] | Freq: Two times a day (BID) | SUBCUTANEOUS | Status: DC
  Administered 2017-07-31 (×2): 20 [IU] via SUBCUTANEOUS
  Filled 2017-07-31 (×3): qty 0.2

## 2017-07-31 MED ORDER — DEXTROSE 10 % IV SOLN: INTRAVENOUS | Status: DC | PRN

## 2017-07-31 NOTE — Consult Note (Signed)
WOC Nurse wound consult note Reason for Consult: Left heel wound, plantar aspect, chronic, non-healing Wound type: Neuropathic Pressure Injury POA: N/A Measurement: 6.5cm x 4cm x 0.2cm.  Wound bed: red, non-granulating, dry. Punctate appearance, closed wound edges (consistent with chronicity) Drainage (amount, consistency, odor) None Periwound: Minor periwound erythema measuring 0.2cm; no fluctuance, warmth or induration. There are several small linear excoriations at the malleolus and dorsal foot. Dressing procedure/placement/frequency: I will implement a conservative POC using twice daily NS gauze dressings to rehydrate the wound and provide a continually moist environment while patient is in house. The size and presentation of the wound are concerning for down stream implications up to and including infection and risk of limb loss. Patient requires off loading of heel via orthotic insert or diabetic shoes, also evaluation and follow up by orthopedic or podiatry office.  If you agree, consider referral to Dr. Aldean Baker, the outpatient wound care center of his choosing or local podiatry for those services upon discharge.  WOC nursing team will not follow, but will remain available to this patient, the nursing and medical teams.  Please re-consult if needed. Thanks, Ladona Mow, MSN, RN, GNP, [place] [facility]  Pager# [phone]

## 2017-07-31 NOTE — Progress Notes (Signed)
Pt left AMA. MD at bedside. IV's were removed. Pt in no distress.

## 2017-07-31 NOTE — Progress Notes (Signed)
PCCM Interval Progress Note  Called to bedside to speak with pt in an attempt to convince him that he should not leave AMA.  Psychiatrist Dr. Elsie Saas came in and saw pt.  As soon as he walked into pt's room and introduced himself, pt stated that he was not interested in speaking to a psychiatrist.  He denied any intentions of self harm or harm to others including SI and / or HI.  He had appropriate affect and was of sound mind.  Informed psych that he was going to leave AMA.  I then discussed the case with Dr. Elsie Saas and based off of his brief interaction and interview with pt, did not feel that pt was threat to himself or others and informed me that we could not mandate that he remain in the hospital (no role for IVC).  I then discussed the situation with the pt himself who again told me that he was going to leave AMA.  I asked him if he realized the severity of his illness and the fact that he has been in the hospital 6 times in the past 6 months.  He stated that yes he did but "I know I"m now better and my DKA is resolved".  He is very anxious to get home so that he can play with his new computer that is arriving today.  I told him that we could not safely discharge him and that if he was adamant, he would have to leave against medical advice.  He stated "yes, please bring me the paperwork so that I can sign it".  AMA papers given to pt by nursing staff and pt signed out AMA.   Rutherford Guys, [place] - [facility] Pager: [phone]  or [phone] 07/31/2017, 3:02 PM

## 2017-07-31 NOTE — Progress Notes (Signed)
CSW consulted  with RN CM to help with mediation and supplies assistance for pt.   Claude Manges Cain Fitzhenry, MSW, LCSW-A Emergency Department Clinical Social Worker [phone]

## 2017-07-31 NOTE — H&P (Signed)
PULMONARY / CRITICAL CARE MEDICINE   Name: Christopher LASCOLA MRN: [id number] DOB: Oct 05, 1986    ADMISSION DATE:  07/30/2017 CONSULTATION DATE:  07/30/2017  REFERRING MD:  Dr. Tomi Bamberger EDP  CHIEF COMPLAINT:  DKA  HISTORY OF PRESENT ILLNESS:  Patient is encephalopathic and therefore HPI obtained from medical chart review. 31 year male with PMH significant for poorly controlled type 1 diabetes, non-compliance, recurrent DKA, schizoaffective disorder, substance abuse, and anemia who presented with altered mental status from home.    Patient has recently been seen as outpatient for chronic right shoulder pain treated with joint injection and chronic left foot wound treated with cephalexin on 7/25.  Patient recently discharged on 7/20 for DKA.    Presented by EMS hypotensive and hyperglycemic. Patient found by roommate and empty bottle of ? "doxelin".  Hypotension responsive to IV fluids.  Initially in ED, patient minimally responsive.  WBC 26.1 with left shift, Hgb 12.6, beta-hydroxybutyric acid >8, Na 123, K 7.9, glucose 1098, BUN 47, sCr 3.38, CXR neg, EKG with ST.  Emperic vancomycin and zosyn started.  Placed on insulin gtt and given NS bolus 2500 ml.  PCCM to admit.   SUBJECTIVE:  Off drip Refusing care   VITAL SIGNS: BP 109/67   Pulse (!) 126   Temp 99 F (37.2 C) (Oral)   Resp 14   Ht '6\' 1"'$  (1.854 m)   Wt 77 kg (169 lb 12.1 oz)   SpO2 99%   BMI 22.40 kg/m   HEMODYNAMICS:   VENTILATOR SETTINGS:   INTAKE / OUTPUT: I/O last 3 completed shifts: In: 7508.7 [I.V.:4408.7; IV Piggyback:3100] Out: 2100 [Urine:2100]  PHYSICAL EXAMINATION: General: awakens fc Neuro: nonfocal, perrl HEENT: jvd increased to wnl PULM: CTA , no crackles CV:  s1 s2 RRT GI: soft, BS wnl, no r Extremities: no edema, no rash   LABS:  BMET  Recent Labs Lab 07/30/17 1955 07/31/17 0004 07/31/17 0300  NA 139 140 139  K 2.8* 3.3* 3.5  CL 110 111 109  CO2 17* 20* 21*  BUN 32* 29* 23*   CREATININE 1.78* 1.45* 1.28*  GLUCOSE 182* 132* 101*    Electrolytes  Recent Labs Lab 07/30/17 1955 07/31/17 0004 07/31/17 0300  CALCIUM 7.3* 7.8* 8.0*    CBC  Recent Labs Lab 07/30/17 0846 07/30/17 0903 07/30/17 1114 07/31/17 0300  WBC 26.1*  --   --  15.0*  HGB 12.6* 14.6 13.9 10.9*  HCT 40.7 43.0 41.0 30.3*  PLT 369  --   --  264    Coag's No results for input(s): APTT, INR in the last 168 hours.  Sepsis Markers  Recent Labs Lab 07/30/17 1504 07/31/17 0300  LATICACIDVEN 2.4*  --   PROCALCITON 0.59 0.71    ABG No results for input(s): PHART, PCO2ART, PO2ART in the last 168 hours.  Liver Enzymes  Recent Labs Lab 07/30/17 0846 07/31/17 0300  AST 23 8*  ALT 15* 10*  ALKPHOS 134* 98  BILITOT 3.2* 0.9  ALBUMIN 3.9 3.2*    Cardiac Enzymes No results for input(s): TROPONINI, PROBNP in the last 168 hours.  Glucose  Recent Labs Lab 07/30/17 2141 07/30/17 2247 07/31/17 0003 07/31/17 0115 07/31/17 0220 07/31/17 0324  GLUCAP 158* 140* 128* 129* 119* 101*    Imaging Dg Chest Portable 1 View  Result Date: 07/30/2017 CLINICAL DATA:  Diabetic, abnormal glucose. EXAM: PORTABLE CHEST 1 VIEW COMPARISON:  07/04/2017. FINDINGS: Trachea is none. Heart size normal. Lungs are clear. No pleural fluid. IMPRESSION:  Negative. Electronically Signed   By: Lorin Picket M.D.   On: 07/30/2017 09:09   Dg Foot 2 Views Left  Result Date: 07/30/2017 CLINICAL DATA:  Delayed wound healing. EXAM: LEFT FOOT - 2 VIEW COMPARISON:  Radiographs of July 06, 2017. FINDINGS: There is no evidence of fracture or dislocation. There is no evidence of arthropathy or other focal bone abnormality. Soft tissues are unremarkable. IMPRESSION: Normal left foot. Electronically Signed   By: Marijo Conception, M.D.   On: 07/30/2017 13:32     STUDIES:  8/13 CXR >> neg  CULTURES: 8/13 BC >>  ANTIBIOTICS: 8/13 Vancomycin >> 8/13 Zosyn >>  SIGNIFICANT EVENTS: 8/13  Admit  LINES/TUBES: PIV x 3  DISCUSSION: 78 yoM with PMH of IDDM, recurrent DKA, noncompliance who presented with AMS in DKA.    ASSESSMENT / PLAN:  PULMONARY A: No acute issues -CXR neg P:   No o2 needed Will need amb pulse ox pre dc pcxr I reviewed, no PNA  CARDIOVASCULAR A:  ST Hypotension- fluid responsive, secondary to dehydration Prolonged Qtc .492 - appears chronic P:  Pos balance to treat lactic No sepsis noted QRS repeat ecg today  RENAL A:   AKI- previous sCr 0.9 Hyperkalemia- related to acidosis, improving AG/ Metabolic acidosis r/t DKA P:   Advance diet for d source Resolving ARF, crt noted Saline at 87 Chem in am   GASTROINTESTINAL A:   Hyperbilirubinemia Elevated Alk phos  - ETOH neg, salicylate neg P:   NPO for now with AMS Pepcid for SUP Trend LFTS  HEMATOLOGIC A:   Hx anemia- stable Dilutional anemia, WBC down P:  Trend CBC SCD and Heparin SQ for VTE prophylaxis   INFECTIOUS A:   Leukocytosis with left shift Chronic left foot wound - xray neg foot P:   PCt nonspecific from stress likely and ARF Dc all ABX and observe  ENDOCRINE A:   IDDM Recurrent DKA Noncompliance - follow AG - Beta hydroxybutyric acid >8 P:   Insulin gtt off SSI, lantus Diet advance  NEUROLOGIC A:   Metabolic encephalopathy- related to DKA - improved Hx of schizoaffective, anxiety, depression -? Empty bottle of "doxelin"- could be doxepin in which patient was taken off of in July.  QRS wnl.   P:   Monitor QRS/ Qtc - ecg again Hold all sedation, pre+-admit meds pysch eval mobilize  FAMILY  - Updates: No family at bedside.  To pt  - Inter-disciplinary family meet or Palliative Care meeting due by:  08/06/17  Lavon Paganini. Titus Mould, MD, [place] Pgr: [facility]

## 2017-07-31 NOTE — Progress Notes (Signed)
Inpatient Diabetes Program Recommendations  AACE/ADA: New Consensus Statement on Inpatient Glycemic Control (2015)  Target Ranges:  Prepandial:   less than 140 mg/dL      Peak postprandial:   less than 180 mg/dL (1-2 hours)      Critically ill patients:  140 - 180 mg/dL   Lab Results  Component Value Date   GLUCAP 204 (H) 07/31/2017   HGBA1C 9.5 (H) 07/30/2017    Consult recurrent DKA admissions  Spoke with patient in room. Patient does not check glucose levels at home. Patient stated he took his basal insulin but not short acting because he did not eat. Patient declines any need to help remember to check glucose levels. Patient kept eyes shut during encounter. Asked patient if he needed something. Patient asked for a phone. Patient had flat affect and seemed disinterested in conversation.  Thanks,  Christena Deem RN, MSN, [facility] Inpatient Diabetes Coordinator Team Pager [phone] (8a-5p)

## 2017-07-31 NOTE — Progress Notes (Signed)
eLink Physician-Brief Progress Note Patient Name: JUNO BOZARD DOB: Apr 04, 1986 MRN: [id number]   Date of Service  07/31/2017  HPI/Events of Note  AG resolved, insulin gtt @ 4.4  eICU Interventions  Dc D5 gtt Once insulin gtt < 4, give lantus & dc insulin gtt after 2h Advance diet to clears, then CHO modified     Intervention Category Major Interventions: Acid-Base disturbance - evaluation and management  ALVA,RAKESH V. 07/31/2017, 1:07 AM

## 2017-08-01 NOTE — Progress Notes (Signed)
Patient ID: [name], male   DOB: 1986/04/23, 30 y.o.   MRN: [id number]  Received psych consult for this patient for agitation and paranoia. Patient refused to discuss his case with this provider when introduced, saying he does not like psychiatrist. His staff RN reported he has denied suicide or homicide ideation and no physical agitation or aggression. He is more annoyed when placed safety sitter which later discontinued. Patient is asking to be discharged home when medically stable. Case discussed with Dr. Rutherford Guys, Dr. Tyson Alias and staff RN. Patient does not meet criteria of IVC at this time.   Tian Davison 08/01/2017 11:56 AM

## 2017-08-03 NOTE — Discharge Summary (Signed)
Physician Discharge Summary  Patient ID: [name] MRN: [id number] DOB/AGE: [birth year] 33 y.o.  Admit date: 07/01/2017 Discharge date: 07/03/17 Admission Diagnoses:  Discharge Diagnoses:  Active Problems:   DKA, type 1, not at goal [facility])   Discharged Condition: Poor. Left [facility]  Hospital Course: [name] is a 31 y.o.malewith medical history significant of type 1 diabetes, insulin noncompliance, DKA, polysubstance abuse including tobacco, cocaine and marijuana, amphetamine, schizophrenia, GERD, depression, anxiety. He was brought to the ED after being found unresponsive by his roommate. The latter reported that 1 d pta he was complaining of increased thirst and polyuria but did not seed medical attention. He had 3 admissions last month for DKA; signing out [place] after the first and returned 3 days later. Initial labs after arrival revealed a K= of 7.9, pH 6.6 and glucose >700.   He was admitted to ICU for DKA treatment with insulin. He was eventually transitioned off insulin drip and started Lantus. He was also on Precedex for agitation which was weaned off. His mental status improved and left AGAINST MEDICAL ADVICE on 07/03/32.  Disposition: 07-Left Against Medical Advice/Left Without Being Seen/Elopement   Allergies as of 07/03/2017      Reactions   Sulfa Antibiotics Other (See Comments)   Unknown childhood allergy      Medication List    ASK your doctor about these medications   accu-chek multiclix lancets Use as directed   blood glucose meter kit and supplies Kit Dispense based on patient and insurance preference. Use up to four times daily as directed. (FOR ICD-9 250.00, 250.01).   glucose blood test strip Use as instructed   insulin aspart 100 UNIT/ML FlexPen Commonly known as:  NOVOLOG FLEXPEN CBG 70 - 120: 0 units CBG 121 - 150: 2 units CBG 151 - 200: 3 units CBG 201 - 250: 5 units CBG 251 - 300: 8 units CBG 301 - 350: 11 units CBG 351 - 400: 15 units    metoCLOPramide 10 MG tablet Commonly known as:  REGLAN Take 1 tablet (10 mg total) by mouth 3 (three) times daily with meals.   ondansetron 4 MG disintegrating tablet Commonly known as:  ZOFRAN ODT Take 1 tablet (4 mg total) by mouth every 8 (eight) hours as needed for nausea or vomiting.   pantoprazole 40 MG tablet Commonly known as:  PROTONIX Take 1 tablet (40 mg total) by mouth daily.   Pen Needles 31G X 5 MM Misc 90 each by Does not apply route 4 (four) times daily -  before meals and at bedtime.        Signed: Korey Prashad 08/03/2017, 5:59 PM

## 2017-08-04 ENCOUNTER — Emergency Department (HOSPITAL_COMMUNITY): Payer: Medicare Other

## 2017-08-04 ENCOUNTER — Inpatient Hospital Stay (HOSPITAL_COMMUNITY)
Admission: EM | Admit: 2017-08-04 | Discharge: 2017-08-08 | DRG: 871 | Disposition: A | Discharge status: Home / Self Care | Payer: Medicare Other | Attending: Internal Medicine | Admitting: Internal Medicine

## 2017-08-04 ENCOUNTER — Inpatient Hospital Stay (HOSPITAL_COMMUNITY): Payer: Medicare Other

## 2017-08-04 DIAGNOSIS — E7220 Disorder of urea cycle metabolism, unspecified: Secondary | ICD-10-CM | POA: Diagnosis not present

## 2017-08-04 DIAGNOSIS — R402342 Coma scale, best motor response, flexion withdrawal, at arrival to emergency department: Secondary | ICD-10-CM | POA: Diagnosis present

## 2017-08-04 DIAGNOSIS — E1011 Type 1 diabetes mellitus with ketoacidosis with coma: Secondary | ICD-10-CM | POA: Diagnosis not present

## 2017-08-04 DIAGNOSIS — R569 Unspecified convulsions: Secondary | ICD-10-CM | POA: Diagnosis not present

## 2017-08-04 DIAGNOSIS — M19011 Primary osteoarthritis, right shoulder: Secondary | ICD-10-CM | POA: Diagnosis present

## 2017-08-04 DIAGNOSIS — Z833 Family history of diabetes mellitus: Secondary | ICD-10-CM | POA: Diagnosis not present

## 2017-08-04 DIAGNOSIS — E875 Hyperkalemia: Secondary | ICD-10-CM | POA: Diagnosis present

## 2017-08-04 DIAGNOSIS — Z882 Allergy status to sulfonamides status: Secondary | ICD-10-CM

## 2017-08-04 DIAGNOSIS — J9690 Respiratory failure, unspecified, unspecified whether with hypoxia or hypercapnia: Secondary | ICD-10-CM

## 2017-08-04 DIAGNOSIS — E872 Acidosis: Secondary | ICD-10-CM | POA: Diagnosis not present

## 2017-08-04 DIAGNOSIS — G9340 Encephalopathy, unspecified: Secondary | ICD-10-CM | POA: Diagnosis not present

## 2017-08-04 DIAGNOSIS — Z0189 Encounter for other specified special examinations: Secondary | ICD-10-CM

## 2017-08-04 DIAGNOSIS — R402112 Coma scale, eyes open, never, at arrival to emergency department: Secondary | ICD-10-CM | POA: Diagnosis present

## 2017-08-04 DIAGNOSIS — R4182 Altered mental status, unspecified: Secondary | ICD-10-CM | POA: Diagnosis present

## 2017-08-04 DIAGNOSIS — A419 Sepsis, unspecified organism: Principal | ICD-10-CM | POA: Diagnosis present

## 2017-08-04 DIAGNOSIS — J9601 Acute respiratory failure with hypoxia: Secondary | ICD-10-CM | POA: Diagnosis present

## 2017-08-04 DIAGNOSIS — R402212 Coma scale, best verbal response, none, at arrival to emergency department: Secondary | ICD-10-CM | POA: Diagnosis present

## 2017-08-04 DIAGNOSIS — E1110 Type 2 diabetes mellitus with ketoacidosis without coma: Secondary | ICD-10-CM | POA: Diagnosis present

## 2017-08-04 DIAGNOSIS — F259 Schizoaffective disorder, unspecified: Secondary | ICD-10-CM | POA: Diagnosis present

## 2017-08-04 DIAGNOSIS — E878 Other disorders of electrolyte and fluid balance, not elsewhere classified: Secondary | ICD-10-CM | POA: Diagnosis not present

## 2017-08-04 DIAGNOSIS — R571 Hypovolemic shock: Secondary | ICD-10-CM | POA: Diagnosis present

## 2017-08-04 DIAGNOSIS — E0811 Diabetes mellitus due to underlying condition with ketoacidosis with coma: Secondary | ICD-10-CM | POA: Diagnosis not present

## 2017-08-04 DIAGNOSIS — Z9289 Personal history of other medical treatment: Secondary | ICD-10-CM

## 2017-08-04 DIAGNOSIS — F419 Anxiety disorder, unspecified: Secondary | ICD-10-CM | POA: Diagnosis not present

## 2017-08-04 DIAGNOSIS — R6521 Severe sepsis with septic shock: Secondary | ICD-10-CM | POA: Diagnosis present

## 2017-08-04 DIAGNOSIS — Z794 Long term (current) use of insulin: Secondary | ICD-10-CM | POA: Diagnosis not present

## 2017-08-04 DIAGNOSIS — G9341 Metabolic encephalopathy: Secondary | ICD-10-CM | POA: Diagnosis present

## 2017-08-04 DIAGNOSIS — R680 Hypothermia, not associated with low environmental temperature: Secondary | ICD-10-CM | POA: Diagnosis present

## 2017-08-04 DIAGNOSIS — N179 Acute kidney failure, unspecified: Secondary | ICD-10-CM | POA: Diagnosis present

## 2017-08-04 DIAGNOSIS — K219 Gastro-esophageal reflux disease without esophagitis: Secondary | ICD-10-CM | POA: Diagnosis present

## 2017-08-04 DIAGNOSIS — J9602 Acute respiratory failure with hypercapnia: Secondary | ICD-10-CM

## 2017-08-04 DIAGNOSIS — E1010 Type 1 diabetes mellitus with ketoacidosis without coma: Secondary | ICD-10-CM | POA: Diagnosis present

## 2017-08-04 DIAGNOSIS — E0810 Diabetes mellitus due to underlying condition with ketoacidosis without coma: Secondary | ICD-10-CM | POA: Diagnosis not present

## 2017-08-04 DIAGNOSIS — E876 Hypokalemia: Secondary | ICD-10-CM | POA: Diagnosis present

## 2017-08-04 DIAGNOSIS — M160 Bilateral primary osteoarthritis of hip: Secondary | ICD-10-CM | POA: Diagnosis present

## 2017-08-04 DIAGNOSIS — M19012 Primary osteoarthritis, left shoulder: Secondary | ICD-10-CM | POA: Diagnosis present

## 2017-08-04 DIAGNOSIS — F17210 Nicotine dependence, cigarettes, uncomplicated: Secondary | ICD-10-CM | POA: Diagnosis present

## 2017-08-04 LAB — URINALYSIS, ROUTINE W REFLEX MICROSCOPIC
BILIRUBIN URINE: NEGATIVE
Bilirubin Urine: NEGATIVE
Glucose, UA: >=500 mg/dL — AB
Glucose, UA: >=500 mg/dL — AB
KETONES UR: 80 mg/dL — AB
Ketones, ur: 80 mg/dL — AB
LEUKOCYTES UA: NEGATIVE
Leukocytes, UA: NEGATIVE
Nitrite: NEGATIVE
Nitrite: NEGATIVE
Protein, ur: 100 mg/dL — AB
Protein, ur: 100 mg/dL — AB
Specific Gravity, Urine: 1.013 (ref 1.005–1.030)
Specific Gravity, Urine: 1.006 (ref 1.005–1.030)
Squamous Epithelial / LPF: NONE SEEN
pH: 5 (ref 5.0–8.0)
pH: 5 (ref 5.0–8.0)

## 2017-08-04 LAB — COMPREHENSIVE METABOLIC PANEL
ALBUMIN: 4.2 g/dL (ref 3.5–5.0)
ALT: 14 U/L — ABNORMAL LOW (ref 17–63)
AST: 17 U/L (ref 15–41)
Alkaline Phosphatase: 263 U/L — ABNORMAL HIGH (ref 38–126)
BUN: 30 mg/dL — ABNORMAL HIGH (ref 6–20)
Calcium: 11.1 mg/dL — ABNORMAL HIGH (ref 8.9–10.3)
Chloride: 92 mmol/L — ABNORMAL LOW (ref 101–111)
Creatinine, Ser: 2.5 mg/dL — ABNORMAL HIGH (ref 0.61–1.24)
GFR calc Af Amer: 38 mL/min — ABNORMAL LOW (ref >60)
GFR calc non Af Amer: 32 mL/min — ABNORMAL LOW (ref >60)
GLUCOSE: 751 mg/dL — AB (ref 65–99)
POTASSIUM: 6.2 mmol/L — AB (ref 3.5–5.1)
SODIUM: 125 mmol/L — AB (ref 135–145)
TOTAL PROTEIN: 7.4 g/dL (ref 6.5–8.1)
Total Bilirubin: 2 mg/dL — ABNORMAL HIGH (ref 0.3–1.2)

## 2017-08-04 LAB — I-STAT CHEM 8, ED
BUN: 37 mg/dL — AB (ref 6–20)
BUN: 30 mg/dL — ABNORMAL HIGH (ref 6–20)
CALCIUM ION: 1.59 mmol/L — AB (ref 1.15–1.40)
CHLORIDE: 104 mmol/L (ref 101–111)
CREATININE: 1.5 mg/dL — AB (ref 0.61–1.24)
Calcium, Ion: 1.61 mmol/L (ref 1.15–1.40)
Chloride: 111 mmol/L (ref 101–111)
Creatinine, Ser: 1.7 mg/dL — ABNORMAL HIGH (ref 0.61–1.24)
Glucose, Bld: >700 mg/dL (ref 65–99)
Glucose, Bld: 628 mg/dL (ref 65–99)
HCT: 36 % — ABNORMAL LOW (ref 39.0–52.0)
HEMATOCRIT: 47 % (ref 39.0–52.0)
HEMOGLOBIN: 12.2 g/dL — AB (ref 13.0–17.0)
Hemoglobin: 16 g/dL (ref 13.0–17.0)
Potassium: 6.3 mmol/L (ref 3.5–5.1)
Potassium: 4.8 mmol/L (ref 3.5–5.1)
Sodium: 124 mmol/L — ABNORMAL LOW (ref 135–145)
Sodium: 135 mmol/L (ref 135–145)
TCO2: 5 mmol/L (ref 0–100)
TCO2: 6 mmol/L (ref 0–100)

## 2017-08-04 LAB — CULTURE, BLOOD (ROUTINE X 2)
CULTURE: NO GROWTH
Culture: NO GROWTH
SPECIAL REQUESTS: ADEQUATE
Special Requests: ADEQUATE

## 2017-08-04 LAB — TYPE AND SCREEN
ABO/RH(D): O POS
ANTIBODY SCREEN: NEGATIVE

## 2017-08-04 LAB — I-STAT CG4 LACTIC ACID, ED
LACTIC ACID, VENOUS: 3.18 mmol/L — AB (ref 0.5–1.9)
LACTIC ACID, VENOUS: 3.47 mmol/L — AB (ref 0.5–1.9)

## 2017-08-04 LAB — BETA-HYDROXYBUTYRIC ACID: Beta-Hydroxybutyric Acid: >8 mmol/L — ABNORMAL HIGH (ref 0.05–0.27)

## 2017-08-04 LAB — BASIC METABOLIC PANEL
BUN: 29 mg/dL — ABNORMAL HIGH (ref 6–20)
BUN: 27 mg/dL — AB (ref 6–20)
CALCIUM: 10.5 mg/dL — AB (ref 8.9–10.3)
CHLORIDE: 108 mmol/L (ref 101–111)
CO2: <7 mmol/L — ABNORMAL LOW (ref 22–32)
CO2: <7 mmol/L — ABNORMAL LOW (ref 22–32)
CREATININE: 2.32 mg/dL — AB (ref 0.61–1.24)
Calcium: 8.9 mg/dL (ref 8.9–10.3)
Chloride: 103 mmol/L (ref 101–111)
Creatinine, Ser: 2.35 mg/dL — ABNORMAL HIGH (ref 0.61–1.24)
GFR calc Af Amer: 41 mL/min — ABNORMAL LOW (ref >60)
GFR calc non Af Amer: 35 mL/min — ABNORMAL LOW (ref >60)
GFR, EST AFRICAN AMERICAN: 40 mL/min — AB (ref >60)
GFR, EST NON AFRICAN AMERICAN: 35 mL/min — AB (ref >60)
Glucose, Bld: 620 mg/dL (ref 65–99)
Glucose, Bld: 429 mg/dL — ABNORMAL HIGH (ref 65–99)
POTASSIUM: 3.2 mmol/L — AB (ref 3.5–5.1)
Potassium: 4.6 mmol/L (ref 3.5–5.1)
SODIUM: 134 mmol/L — AB (ref 135–145)
SODIUM: 140 mmol/L (ref 135–145)

## 2017-08-04 LAB — CBC WITH DIFFERENTIAL/PLATELET
BASOS PCT: 1 %
Basophils Absolute: 0.5 10*3/uL — ABNORMAL HIGH (ref 0.0–0.1)
EOS PCT: 0 %
Eosinophils Absolute: 0 10*3/uL (ref 0.0–0.7)
HCT: 43.9 % (ref 39.0–52.0)
HEMOGLOBIN: 14.8 g/dL (ref 13.0–17.0)
LYMPHS PCT: 13 %
Lymphs Abs: 6.6 10*3/uL — ABNORMAL HIGH (ref 0.7–4.0)
MCH: 30 pg (ref 26.0–34.0)
MCHC: 33.7 g/dL (ref 30.0–36.0)
MCV: 89 fL (ref 78.0–100.0)
MONO ABS: 3.1 10*3/uL — AB (ref 0.1–1.0)
Monocytes Relative: 6 %
NEUTROS PCT: 80 %
Neutro Abs: 40.8 10*3/uL — ABNORMAL HIGH (ref 1.7–7.7)
Platelets: 653 10*3/uL — ABNORMAL HIGH (ref 150–400)
RBC: 4.93 MIL/uL (ref 4.22–5.81)
RDW: 13.1 % (ref 11.5–15.5)
WBC MORPHOLOGY: INCREASED
WBC: 51 10*3/uL (ref 4.0–10.5)

## 2017-08-04 LAB — PHOSPHORUS
PHOSPHORUS: 3.7 mg/dL (ref 2.5–4.6)
Phosphorus: 5.8 mg/dL — ABNORMAL HIGH (ref 2.5–4.6)

## 2017-08-04 LAB — APTT: APTT: 33 s (ref 24–36)

## 2017-08-04 LAB — POCT I-STAT 3, ART BLOOD GAS (G3+)
ACID-BASE DEFICIT: 29 mmol/L — AB (ref 0.0–2.0)
BICARBONATE: 3.7 mmol/L — AB (ref 20.0–28.0)
O2 SAT: 99 %
PO2 ART: 193 mmHg — AB (ref 83.0–108.0)
pCO2 arterial: 17.8 mmHg — CL (ref 32.0–48.0)
pH, Arterial: 6.881 — CL (ref 7.350–7.450)

## 2017-08-04 LAB — GLUCOSE, CAPILLARY
GLUCOSE-CAPILLARY: 471 mg/dL — AB (ref 65–99)
GLUCOSE-CAPILLARY: 419 mg/dL — AB (ref 65–99)
Glucose-Capillary: 385 mg/dL — ABNORMAL HIGH (ref 65–99)

## 2017-08-04 LAB — CBG MONITORING, ED
Glucose-Capillary: >600 mg/dL (ref 65–99)
Glucose-Capillary: 588 mg/dL (ref 65–99)
Glucose-Capillary: 479 mg/dL — ABNORMAL HIGH (ref 65–99)

## 2017-08-04 LAB — PROTIME-INR
INR: 1.38
Prothrombin Time: 17.1 seconds — ABNORMAL HIGH (ref 11.4–15.2)

## 2017-08-04 LAB — MAGNESIUM
MAGNESIUM: 2.1 mg/dL (ref 1.7–2.4)
Magnesium: 1.9 mg/dL (ref 1.7–2.4)

## 2017-08-04 LAB — TSH: TSH: 1.556 u[IU]/mL (ref 0.350–4.500)

## 2017-08-04 LAB — ETHANOL: Alcohol, Ethyl (B): <5 mg/dL (ref <5)

## 2017-08-04 LAB — LACTIC ACID, PLASMA: Lactic Acid, Venous: 1.6 mmol/L (ref 0.5–1.9)

## 2017-08-04 LAB — TROPONIN I: Troponin I: <0.03 ng/mL (ref <0.03)

## 2017-08-04 LAB — AMMONIA: AMMONIA: 176 umol/L — AB (ref 9–35)

## 2017-08-04 LAB — PROCALCITONIN: PROCALCITONIN: 3.06 ng/mL

## 2017-08-04 MED ORDER — ETOMIDATE 2 MG/ML IV SOLN
20.0000 mg | Freq: Once | INTRAVENOUS | Status: AC
  Administered 2017-08-04: 20 mg via INTRAVENOUS

## 2017-08-04 MED ORDER — SODIUM CHLORIDE 0.9 % IV SOLN
0.0000 ug/kg/h | INTRAVENOUS | Status: DC
  Administered 2017-08-04: 0.4 ug/kg/h via INTRAVENOUS
  Filled 2017-08-04: qty 2

## 2017-08-04 MED ORDER — HEPARIN SODIUM (PORCINE) 5000 UNIT/ML IJ SOLN
5000.0000 [IU] | Freq: Three times a day (TID) | INTRAMUSCULAR | Status: DC
  Administered 2017-08-05 – 2017-08-08 (×10): 5000 [IU] via SUBCUTANEOUS
  Filled 2017-08-04 (×14): qty 1

## 2017-08-04 MED ORDER — LACTATED RINGERS IV SOLN: INTRAVENOUS | Status: DC

## 2017-08-04 MED ORDER — INSULIN ASPART 100 UNIT/ML IV SOLN
10.0000 [IU] | Freq: Once | INTRAVENOUS | Status: AC
  Administered 2017-08-04: 10 [IU] via INTRAVENOUS
  Filled 2017-08-04: qty 0.1

## 2017-08-04 MED ORDER — LACTULOSE 10 GM/15ML PO SOLN
20.0000 g | Freq: Three times a day (TID) | ORAL | Status: DC
  Administered 2017-08-05: 20 g
  Filled 2017-08-04 (×2): qty 30

## 2017-08-04 MED ORDER — PIPERACILLIN-TAZOBACTAM 3.375 G IVPB 30 MIN
3.3750 g | Freq: Once | INTRAVENOUS | Status: DC
  Filled 2017-08-04: qty 50

## 2017-08-04 MED ORDER — NOREPINEPHRINE BITARTRATE 1 MG/ML IV SOLN
5.0000 ug/min | INTRAVENOUS | Status: DC
  Administered 2017-08-04: 5 ug/min via INTRAVENOUS
  Administered 2017-08-05: 25 ug/min via INTRAVENOUS
  Filled 2017-08-04: qty 4

## 2017-08-04 MED ORDER — POTASSIUM CHLORIDE 10 MEQ/100ML IV SOLN
10.0000 meq | INTRAVENOUS | Status: AC
  Administered 2017-08-04 (×2): 10 meq via INTRAVENOUS
  Filled 2017-08-04 (×2): qty 100

## 2017-08-04 MED ORDER — SODIUM CHLORIDE 0.9 % IV SOLN
INTRAVENOUS | Status: DC
  Administered 2017-08-04: 5.4 [IU]/h via INTRAVENOUS
  Administered 2017-08-05: 24.2 [IU]/h via INTRAVENOUS
  Administered 2017-08-05: 19.3 [IU]/h via INTRAVENOUS
  Administered 2017-08-05: 22 [IU]/h via INTRAVENOUS
  Administered 2017-08-05: 7.2 [IU]/h via INTRAVENOUS
  Administered 2017-08-06: 6.1 [IU]/h via INTRAVENOUS
  Filled 2017-08-04 (×6): qty 1

## 2017-08-04 MED ORDER — SODIUM BICARBONATE 8.4 % IV SOLN: 50.0000 meq | Freq: Once | INTRAVENOUS | Status: AC

## 2017-08-04 MED ORDER — CALCIUM GLUCONATE 10 % IV SOLN
1.0000 g | Freq: Once | INTRAVENOUS | Status: DC
  Filled 2017-08-04: qty 10

## 2017-08-04 MED ORDER — SODIUM CHLORIDE 0.9 % IV SOLN
INTRAVENOUS | Status: DC
  Administered 2017-08-04 – 2017-08-05 (×3): via INTRAVENOUS

## 2017-08-04 MED ORDER — LORAZEPAM 2 MG/ML IJ SOLN
2.0000 mg | Freq: Once | INTRAMUSCULAR | Status: AC
  Administered 2017-08-04: 2 mg via INTRAVENOUS

## 2017-08-04 MED ORDER — SODIUM CHLORIDE 0.9 % IV SOLN
INTRAVENOUS | Status: AC
  Administered 2017-08-04: 21:00:00 via INTRAVENOUS

## 2017-08-04 MED ORDER — SODIUM CHLORIDE 0.9 % IV BOLUS (SEPSIS)
1000.0000 mL | Freq: Once | INTRAVENOUS | Status: AC
  Administered 2017-08-04: 1000 mL via INTRAVENOUS

## 2017-08-04 MED ORDER — SODIUM CHLORIDE 0.9 % IV SOLN: INTRAVENOUS | Status: DC

## 2017-08-04 MED ORDER — HEPARIN SODIUM (PORCINE) 5000 UNIT/ML IJ SOLN: 5000.0000 [IU] | Freq: Three times a day (TID) | INTRAMUSCULAR | Status: DC

## 2017-08-04 MED ORDER — ALBUTEROL SULFATE (2.5 MG/3ML) 0.083% IN NEBU
INHALATION_SOLUTION | RESPIRATORY_TRACT | Status: AC
  Filled 2017-08-04: qty 12

## 2017-08-04 MED ORDER — VANCOMYCIN HCL IN DEXTROSE 1-5 GM/200ML-% IV SOLN
1000.0000 mg | Freq: Two times a day (BID) | INTRAVENOUS | Status: DC
  Administered 2017-08-05 – 2017-08-07 (×5): 1000 mg via INTRAVENOUS
  Filled 2017-08-04 (×5): qty 200

## 2017-08-04 MED ORDER — VANCOMYCIN HCL 10 G IV SOLR
1500.0000 mg | Freq: Once | INTRAVENOUS | Status: DC
  Filled 2017-08-04: qty 1500

## 2017-08-04 MED ORDER — FAMOTIDINE IN NACL 20-0.9 MG/50ML-% IV SOLN
20.0000 mg | Freq: Two times a day (BID) | INTRAVENOUS | Status: DC
  Administered 2017-08-04 – 2017-08-07 (×7): 20 mg via INTRAVENOUS
  Filled 2017-08-04 (×8): qty 50

## 2017-08-04 MED ORDER — ALBUTEROL SULFATE (2.5 MG/3ML) 0.083% IN NEBU
10.0000 mg | INHALATION_SOLUTION | Freq: Once | RESPIRATORY_TRACT | Status: AC
  Administered 2017-08-04: 10 mg via RESPIRATORY_TRACT
  Filled 2017-08-04: qty 12

## 2017-08-04 MED ORDER — SODIUM CHLORIDE 0.9 % IV SOLN
INTRAVENOUS | Status: DC
  Filled 2017-08-04: qty 1

## 2017-08-04 MED ORDER — VANCOMYCIN HCL 10 G IV SOLR
1500.0000 mg | Freq: Once | INTRAVENOUS | Status: AC
  Administered 2017-08-04: 1500 mg via INTRAVENOUS
  Filled 2017-08-04: qty 1500

## 2017-08-04 MED ORDER — DEXTROSE-NACL 5-0.45 % IV SOLN
INTRAVENOUS | Status: DC
  Administered 2017-08-05 – 2017-08-07 (×5): via INTRAVENOUS

## 2017-08-04 MED ORDER — SODIUM BICARBONATE 8.4 % IV SOLN
50.0000 meq | Freq: Once | INTRAVENOUS | Status: AC
  Administered 2017-08-04: 50 meq via INTRAVENOUS
  Filled 2017-08-04: qty 50

## 2017-08-04 MED ORDER — SODIUM CHLORIDE 0.9 % IV SOLN
1.0000 g | Freq: Once | INTRAVENOUS | Status: AC
  Administered 2017-08-04: 1 g via INTRAVENOUS
  Filled 2017-08-04: qty 10

## 2017-08-04 MED ORDER — LORAZEPAM 2 MG/ML IJ SOLN
INTRAMUSCULAR | Status: AC
Start: 2017-08-04 — End: 2017-08-04
  Administered 2017-08-04: 2 mg via INTRAVENOUS
  Filled 2017-08-04: qty 1

## 2017-08-04 MED ORDER — LIDOCAINE HCL (PF) 1 % IJ SOLN
5.0000 mL | Freq: Once | INTRAMUSCULAR | Status: AC
  Administered 2017-08-04: 5 mL via INTRADERMAL
  Filled 2017-08-04: qty 5

## 2017-08-04 MED ORDER — PIPERACILLIN-TAZOBACTAM 3.375 G IVPB
3.3750 g | Freq: Three times a day (TID) | INTRAVENOUS | Status: DC
  Administered 2017-08-05 – 2017-08-07 (×8): 3.375 g via INTRAVENOUS
  Filled 2017-08-04 (×9): qty 50

## 2017-08-04 MED ORDER — DOCUSATE SODIUM 50 MG/5ML PO LIQD
100.0000 mg | Freq: Two times a day (BID) | ORAL | Status: DC | PRN
  Filled 2017-08-04: qty 10

## 2017-08-04 MED ORDER — STERILE WATER FOR INJECTION IV SOLN
INTRAVENOUS | Status: DC
  Administered 2017-08-04 – 2017-08-05 (×2): via INTRAVENOUS
  Filled 2017-08-04 (×4): qty 850

## 2017-08-04 MED ORDER — SODIUM CHLORIDE 0.9 % IV SOLN
500.0000 mg | Freq: Two times a day (BID) | INTRAVENOUS | Status: DC
  Administered 2017-08-04 – 2017-08-08 (×8): 500 mg via INTRAVENOUS
  Filled 2017-08-04 (×9): qty 5

## 2017-08-04 MED ORDER — CALCIUM CHLORIDE 10 % IV SOLN
1.0000 g | Freq: Once | INTRAVENOUS | Status: AC
  Administered 2017-08-04: 1 g via INTRAVENOUS
  Filled 2017-08-04: qty 10

## 2017-08-04 MED ORDER — SODIUM CHLORIDE 0.9 % IV SOLN
INTRAVENOUS | Status: DC
  Administered 2017-08-04: 19:00:00 via INTRAVENOUS

## 2017-08-04 MED ORDER — STERILE WATER FOR INJECTION IV SOLN
Freq: Once | INTRAVENOUS | Status: AC
  Administered 2017-08-04: 23:00:00 via INTRAVENOUS
  Filled 2017-08-04: qty 850

## 2017-08-04 MED ORDER — PIPERACILLIN-TAZOBACTAM 3.375 G IVPB 30 MIN
3.3750 g | Freq: Once | INTRAVENOUS | Status: AC
  Administered 2017-08-04: 3.375 g via INTRAVENOUS
  Filled 2017-08-04: qty 50

## 2017-08-04 MED ORDER — FENTANYL CITRATE (PF) 100 MCG/2ML IJ SOLN
100.0000 ug | INTRAMUSCULAR | Status: DC | PRN
  Administered 2017-08-05 – 2017-08-07 (×14): 100 ug via INTRAVENOUS
  Filled 2017-08-04 (×12): qty 2

## 2017-08-04 MED ORDER — DEXMEDETOMIDINE HCL IN NACL 400 MCG/100ML IV SOLN
0.0000 ug/kg/h | INTRAVENOUS | Status: DC
  Administered 2017-08-04: 1.2 ug/kg/h via INTRAVENOUS
  Administered 2017-08-05: 0.9 ug/kg/h via INTRAVENOUS
  Administered 2017-08-05: 0.6 ug/kg/h via INTRAVENOUS
  Administered 2017-08-06 (×2): 1.1 ug/kg/h via INTRAVENOUS
  Administered 2017-08-06: 0.9 ug/kg/h via INTRAVENOUS
  Administered 2017-08-06: 1.1 ug/kg/h via INTRAVENOUS
  Administered 2017-08-06: 1 ug/kg/h via INTRAVENOUS
  Administered 2017-08-06 (×2): 1.1 ug/kg/h via INTRAVENOUS
  Administered 2017-08-06: 1 ug/kg/h via INTRAVENOUS
  Administered 2017-08-06: 1.1 ug/kg/h via INTRAVENOUS
  Administered 2017-08-07: 0.8 ug/kg/h via INTRAVENOUS
  Administered 2017-08-07 (×2): 1.2 ug/kg/h via INTRAVENOUS
  Filled 2017-08-04 (×16): qty 100

## 2017-08-04 MED ORDER — SODIUM BICARBONATE 8.4 % IV SOLN
INTRAVENOUS | Status: AC
  Administered 2017-08-04: 50 meq
  Filled 2017-08-04: qty 50

## 2017-08-04 MED ORDER — SODIUM CHLORIDE 0.9 % IV SOLN: 250.0000 mL | INTRAVENOUS | Status: DC | PRN

## 2017-08-04 MED ORDER — ROCURONIUM BROMIDE 50 MG/5ML IV SOLN
100.0000 mg | Freq: Once | INTRAVENOUS | Status: AC
  Administered 2017-08-04: 100 mg via INTRAVENOUS
  Filled 2017-08-04: qty 10

## 2017-08-04 MED ORDER — FENTANYL CITRATE (PF) 100 MCG/2ML IJ SOLN
100.0000 ug | INTRAMUSCULAR | Status: AC | PRN
  Administered 2017-08-04 – 2017-08-05 (×3): 100 ug via INTRAVENOUS
  Filled 2017-08-04 (×5): qty 2

## 2017-08-04 MED ORDER — NOREPINEPHRINE BITARTRATE 1 MG/ML IV SOLN
0.0000 ug/min | INTRAVENOUS | Status: DC
  Administered 2017-08-04: 4 ug/min via INTRAVENOUS
  Administered 2017-08-04: 2 ug/min via INTRAVENOUS
  Administered 2017-08-04: 3 ug/min via INTRAVENOUS
  Filled 2017-08-04: qty 4

## 2017-08-04 NOTE — Progress Notes (Signed)
ABG run 3x with 3 samples on istat in ED with all results below reportable range. ED MD made aware.

## 2017-08-04 NOTE — Progress Notes (Signed)
Pt. Getting vt of almost 2000, dropped pt. Ps to 10.

## 2017-08-04 NOTE — ED Notes (Signed)
Pt intubated by Med Res with 7.5 ETT tube 24 @ lip, + color change.

## 2017-08-04 NOTE — ED Provider Notes (Signed)
[facility] DEPT Provider Note   CSN: [id number] Arrival date & time: 08/04/17  1504     History   Chief Complaint Chief Complaint  Patient presents with  . Loss of Consciousness  . Hyperglycemia    HPI [name] is a Christopher y.o. [name].  HPI Patient with history of poorly controlled type I DM, noncomplicance, recurrent DKA, schizoaffective disorder, substance abuse who presents with altered mental status. Per EMS, responded to call at 2:30 AM this morning, found patient to be tachypneic and hyperglycemic in 500s, however patient refused transport to ED. Called again for decreased responsiveness. Found to have greater than 500 glucose. Patient unable to provide any history.    Past Medical History:  Diagnosis Date  . Anemia   . Anxiety   . Arthritis    "both hips; both shoulders" (06/06/2017)  . Chicken pox   . Childhood asthma   . Chronic pain   . Depression   . DKA (diabetic ketoacidoses) ([place]) 11/18/2014  . DKA (diabetic ketoacidoses) ([place]) 07/05/2017  . GERD (gastroesophageal reflux disease)   . Hypertension   . Migraine    "a few/year" (06/06/2017)  . Noncompliance with medication regimen   . Pneumonia    "several times" (06/06/2017)  . Polysubstance abuse   . Schizo affective schizophrenia ([place])   . Scoliosis   . Type I diabetes mellitus [facility])     Patient Active Problem List   Diagnosis Date Noted  . DKA (diabetic ketoacidoses) ([place]) 08/04/2017  . Acute respiratory failure with hypoxia ([place])   . Seizure ([place])   . Diabetic ketoacidosis ([place]) 07/30/2017  . Normocytic anemia 07/05/2017  . Numbness of right hand 07/05/2017  . Elevated troponin I measurement   . Positive D dimer   . DKA, type 1, not at goal [facility]) 07/01/2017  . Hyperkalemia   . AKI (acute kidney injury) ([place])   . Medically noncompliant   . Hyperglycemia 06/16/2017  . Nausea & vomiting 06/15/2017  . Abdominal pain 06/15/2017  . Chest pain 06/07/2017  . GERD (gastroesophageal reflux  disease) 06/07/2017  . Substance abuse 05/30/2016  . Substance induced mood disorder ([place]) 05/30/2016  . Tobacco use disorder 12/24/2015  . DM hyperosmolarity type I, uncontrolled ([place]) 12/19/2015  . Generalized headache 07/22/2015  . Bilateral hip bursitis 05/26/2015  . Depression   . Generalized anxiety disorder 03/29/2015  . Undifferentiated schizophrenia ([place])   . Hip pain, bilateral 11/18/2014  . Dehydration 06/17/2012    Past Surgical History:  Procedure Laterality Date  . CARDIAC CATHETERIZATION  11/2016  . INCISION AND DRAINAGE ABSCESS Left 11/2011   "MRSA removed off my thumb"       Home Medications    Prior to Admission medications   Medication Sig Start Date End Date Taking? Authorizing Provider  amphetamine-dextroamphetamine (ADDERALL) 30 MG tablet Take 1 tablet by mouth daily. 06/26/17  Yes [provider]  doxepin (SINEQUAN) 10 MG/ML solution Take 10 mLs by mouth at bedtime. 07/11/17  Yes [provider]  metoCLOPramide (REGLAN) 10 MG tablet Take 1 tablet (10 mg total) by mouth 3 (three) times daily with meals. 06/11/17 06/11/18 Yes Hosie Poisson, MD  ondansetron (ZOFRAN ODT) 4 MG disintegrating tablet Take 1 tablet (4 mg total) by mouth every 8 (eight) hours as needed for nausea or vomiting. 06/19/17  Yes Amin, Ankit Chirag, MD  pantoprazole (PROTONIX) 40 MG tablet Take 1 tablet (40 mg total) by mouth daily. 06/10/17  Yes Hosie Poisson, MD  traZODone (DESYREL) 150  MG tablet Take 150 mg by mouth at bedtime. 06/26/17  Yes [provider]  blood glucose meter kit and supplies KIT Dispense based on patient and insurance preference. Use up to four times daily as directed. (FOR ICD-9 250.00, 250.01). Patient not taking: Reported on 08/04/2017 06/18/17   Damita Lack, MD  cephALEXin (KEFLEX) 500 MG capsule Take 1 capsule (500 mg total) by mouth 3 (three) times daily. Patient not taking: Reported on 07/30/2017 07/11/17   Wallene Huh, DPM  glucose  blood test strip Use as instructed Patient not taking: Reported on 08/04/2017 06/18/17   Damita Lack, MD  insulin aspart (NOVOLOG FLEXPEN) 100 UNIT/ML FlexPen CBG 70 - 120: 0 units CBG 121 - 150: 2 units CBG 151 - 200: 3 units CBG 201 - 250: 5 units CBG 251 - 300: 8 units CBG 301 - 350: 11 units CBG 351 - 400: 15 units Patient not taking: Reported on 07/30/2017 06/11/17   Hosie Poisson, MD  Insulin Detemir (LEVEMIR FLEXPEN) 100 UNIT/ML Pen Inject 45 Units into the skin daily. Patient not taking: Reported on 07/30/2017 07/06/17   Donne Hazel, MD  Insulin Pen Needle (PEN NEEDLES) 31G X 5 MM MISC 90 each by Does not apply route 4 (four) times daily -  before meals and at bedtime. Patient not taking: Reported on 08/04/2017 06/11/17   Hosie Poisson, MD  Lancets (ACCU-CHEK MULTICLIX) lancets Use as directed Patient not taking: Reported on 08/04/2017 06/18/17   Damita Lack, MD  traMADol (ULTRAM) 50 MG tablet Take 1-2 tablets (50-100 mg total) by mouth every 6 (six) hours as needed for moderate pain or severe pain. Patient not taking: Reported on 07/30/2017 07/06/17   Donne Hazel, MD    Family History Family History  Problem Relation Age of Onset  . Diabetes Mother     Social History Social History  Substance Use Topics  . Smoking status: Current Every Day Smoker    Packs/day: 0.50    Years: 9.00    Types: Cigarettes  . Smokeless tobacco: Never Used  . Alcohol use No     Allergies   Sulfa antibiotics   Review of Systems Review of Systems  Unable to perform ROS: Patient unresponsive     Physical Exam Updated Vital Signs BP (!) 85/59   Pulse (!) 127   Temp 99.5 F (37.5 C)   Resp 15   Ht '6\' 2"'$  (1.88 m)   Wt 80.1 kg (176 lb 9.4 oz)   SpO2 100%   BMI 22.67 kg/m   Physical Exam  Constitutional: He appears well-developed and well-nourished. He appears distressed.  HENT:  Head: Normocephalic and atraumatic.  Eyes: Conjunctivae are normal.  Neck: Neck supple.   Cardiovascular: Normal rate and regular rhythm.   No murmur heard. Pulmonary/Chest: Breath sounds normal. Tachypnea noted. He is in respiratory distress.  Abdominal: Soft. There is no tenderness.  Musculoskeletal: He exhibits no edema.  Neurological: GCS eye subscore is 1. GCS verbal subscore is 2. GCS motor subscore is 4.  Skin: Skin is dry.  Cool skin  Psychiatric: He has a normal mood and affect.  Nursing note and vitals reviewed.    ED Treatments / Results  Labs (all labs ordered are listed, but only abnormal results are displayed) Labs Reviewed  CBC WITH DIFFERENTIAL/PLATELET - Abnormal; Notable for the following:       Result Value   WBC 51.0 (*)    Platelets 653 (*)  Neutro Abs 40.8 (*)    Lymphs Abs 6.6 (*)    Monocytes Absolute 3.1 (*)    Basophils Absolute 0.5 (*)    All other components within normal limits  BETA-HYDROXYBUTYRIC ACID - Abnormal; Notable for the following:    Beta-Hydroxybutyric Acid >8.00 (*)    All other components within normal limits  URINALYSIS, ROUTINE W REFLEX MICROSCOPIC - Abnormal; Notable for the following:    Color, Urine STRAW (*)    Glucose, UA >=500 (*)    Hgb urine dipstick SMALL (*)    Ketones, ur 80 (*)    Protein, ur 100 (*)    Bacteria, UA RARE (*)    Squamous Epithelial / LPF 0-5 (*)    All other components within normal limits  COMPREHENSIVE METABOLIC PANEL - Abnormal; Notable for the following:    Sodium 125 (*)    Potassium 6.2 (*)    Chloride 92 (*)    CO2 <7 (*)    Glucose, Bld 751 (*)    BUN 30 (*)    Creatinine, Ser 2.50 (*)    Calcium 11.1 (*)    ALT 14 (*)    Alkaline Phosphatase 263 (*)    Total Bilirubin 2.0 (*)    GFR calc non Af Amer 32 (*)    GFR calc Af Amer 38 (*)    All other components within normal limits  AMMONIA - Abnormal; Notable for the following:    Ammonia 176 (*)    All other components within normal limits  BASIC METABOLIC PANEL - Abnormal; Notable for the following:    Sodium 134  (*)    CO2 <7 (*)    Glucose, Bld 620 (*)    BUN 29 (*)    Creatinine, Ser 2.35 (*)    Calcium 10.5 (*)    GFR calc non Af Amer 35 (*)    GFR calc Af Amer 40 (*)    All other components within normal limits  CBC - Abnormal; Notable for the following:    WBC 20.8 (*)    RBC 3.66 (*)    Hemoglobin 10.4 (*)    HCT 29.0 (*)    All other components within normal limits  BASIC METABOLIC PANEL - Abnormal; Notable for the following:    Potassium 2.9 (*)    CO2 9 (*)    Glucose, Bld 391 (*)    BUN 24 (*)    Creatinine, Ser 2.32 (*)    Calcium 8.4 (*)    GFR calc non Af Amer 35 (*)    GFR calc Af Amer 41 (*)    Anion gap 24 (*)    All other components within normal limits  MAGNESIUM - Abnormal; Notable for the following:    Magnesium 1.4 (*)    All other components within normal limits  PHOSPHORUS - Abnormal; Notable for the following:    Phosphorus <1.0 (*)    All other components within normal limits  BASIC METABOLIC PANEL - Abnormal; Notable for the following:    Potassium 3.2 (*)    CO2 <7 (*)    Glucose, Bld 429 (*)    BUN 27 (*)    Creatinine, Ser 2.32 (*)    GFR calc non Af Amer 35 (*)    GFR calc Af Amer 41 (*)    All other components within normal limits  BASIC METABOLIC PANEL - Abnormal; Notable for the following:    Potassium 2.7 (*)    CO2  18 (*)    Glucose, Bld 287 (*)    BUN 22 (*)    Creatinine, Ser 2.07 (*)    Calcium 8.2 (*)    GFR calc non Af Amer 41 (*)    GFR calc Af Amer 47 (*)    All other components within normal limits  MAGNESIUM - Abnormal; Notable for the following:    Magnesium 1.2 (*)    All other components within normal limits  PHOSPHORUS - Abnormal; Notable for the following:    Phosphorus 5.8 (*)    All other components within normal limits  PHOSPHORUS - Abnormal; Notable for the following:    Phosphorus <1.0 (*)    All other components within normal limits  LACTIC ACID, PLASMA - Abnormal; Notable for the following:    Lactic Acid,  Venous 2.5 (*)    All other components within normal limits  URINALYSIS, ROUTINE W REFLEX MICROSCOPIC - Abnormal; Notable for the following:    Color, Urine STRAW (*)    Glucose, UA >=500 (*)    Hgb urine dipstick MODERATE (*)    Ketones, ur 80 (*)    Protein, ur 100 (*)    Bacteria, UA RARE (*)    All other components within normal limits  PROTIME-INR - Abnormal; Notable for the following:    Prothrombin Time 17.1 (*)    All other components within normal limits  GLUCOSE, CAPILLARY - Abnormal; Notable for the following:    Glucose-Capillary 471 (*)    All other components within normal limits  BASIC METABOLIC PANEL - Abnormal; Notable for the following:    Potassium 2.4 (*)    Chloride 115 (*)    Glucose, Bld 192 (*)    Creatinine, Ser 1.81 (*)    Calcium 8.1 (*)    GFR calc non Af Amer 48 (*)    GFR calc Af Amer 55 (*)    All other components within normal limits  PHOSPHORUS - Abnormal; Notable for the following:    Phosphorus <1.0 (*)    All other components within normal limits  GLUCOSE, CAPILLARY - Abnormal; Notable for the following:    Glucose-Capillary 419 (*)    All other components within normal limits  GLUCOSE, CAPILLARY - Abnormal; Notable for the following:    Glucose-Capillary 385 (*)    All other components within normal limits  GLUCOSE, CAPILLARY - Abnormal; Notable for the following:    Glucose-Capillary 375 (*)    All other components within normal limits  GLUCOSE, CAPILLARY - Abnormal; Notable for the following:    Glucose-Capillary 381 (*)    All other components within normal limits  GLUCOSE, CAPILLARY - Abnormal; Notable for the following:    Glucose-Capillary 375 (*)    All other components within normal limits  GLUCOSE, CAPILLARY - Abnormal; Notable for the following:    Glucose-Capillary 346 (*)    All other components within normal limits  GLUCOSE, CAPILLARY - Abnormal; Notable for the following:    Glucose-Capillary 320 (*)    All other  components within normal limits  GLUCOSE, CAPILLARY - Abnormal; Notable for the following:    Glucose-Capillary 300 (*)    All other components within normal limits  GLUCOSE, CAPILLARY - Abnormal; Notable for the following:    Glucose-Capillary 280 (*)    All other components within normal limits  GLUCOSE, CAPILLARY - Abnormal; Notable for the following:    Glucose-Capillary 269 (*)    All other components within normal limits  LACTIC ACID, PLASMA - Abnormal; Notable for the following:    Lactic Acid, Venous 2.8 (*)    All other components within normal limits  GLUCOSE, CAPILLARY - Abnormal; Notable for the following:    Glucose-Capillary 241 (*)    All other components within normal limits  GLUCOSE, CAPILLARY - Abnormal; Notable for the following:    Glucose-Capillary 217 (*)    All other components within normal limits  GLUCOSE, CAPILLARY - Abnormal; Notable for the following:    Glucose-Capillary 183 (*)    All other components within normal limits  GLUCOSE, CAPILLARY - Abnormal; Notable for the following:    Glucose-Capillary 151 (*)    All other components within normal limits  GLUCOSE, CAPILLARY - Abnormal; Notable for the following:    Glucose-Capillary 199 (*)    All other components within normal limits  GLUCOSE, CAPILLARY - Abnormal; Notable for the following:    Glucose-Capillary 125 (*)    All other components within normal limits  CBG MONITORING, ED - Abnormal; Notable for the following:    Glucose-Capillary >600 (*)    All other components within normal limits  I-STAT CHEM 8, ED - Abnormal; Notable for the following:    Sodium 124 (*)    Potassium 6.3 (*)    BUN 37 (*)    Creatinine, Ser 1.70 (*)    Glucose, Bld >700 (*)    Calcium, Ion 1.59 (*)    All other components within normal limits  I-STAT CG4 LACTIC ACID, ED - Abnormal; Notable for the following:    Lactic Acid, Venous 3.18 (*)    All other components within normal limits  CBG MONITORING, ED -  Abnormal; Notable for the following:    Glucose-Capillary >600 (*)    All other components within normal limits  I-STAT CG4 LACTIC ACID, ED - Abnormal; Notable for the following:    Lactic Acid, Venous 3.47 (*)    All other components within normal limits  CBG MONITORING, ED - Abnormal; Notable for the following:    Glucose-Capillary 588 (*)    All other components within normal limits  I-STAT CHEM 8, ED - Abnormal; Notable for the following:    BUN 30 (*)    Creatinine, Ser 1.50 (*)    Glucose, Bld 628 (*)    Calcium, Ion 1.61 (*)    Hemoglobin 12.2 (*)    HCT 36.0 (*)    All other components within normal limits  CBG MONITORING, ED - Abnormal; Notable for the following:    Glucose-Capillary 479 (*)    All other components within normal limits  POCT I-STAT 3, ART BLOOD GAS (G3+) - Abnormal; Notable for the following:    pH, Arterial 6.881 (*)    pCO2 arterial 17.8 (*)    pO2, Arterial 193.0 (*)    Bicarbonate 3.7 (*)    Acid-base deficit 29.0 (*)    All other components within normal limits  POCT I-STAT 3, ART BLOOD GAS (G3+) - Abnormal; Notable for the following:    pH, Arterial 7.202 (*)    pCO2 arterial <15.0 (*)    pO2, Arterial 156.0 (*)    Bicarbonate 5.7 (*)    Acid-base deficit 21.0 (*)    All other components within normal limits  POCT I-STAT 3, ART BLOOD GAS (G3+) - Abnormal; Notable for the following:    pH, Arterial 7.235 (*)    pCO2 arterial 16.6 (*)    pO2, Arterial 136.0 (*)    Bicarbonate  7.1 (*)    Acid-base deficit 19.0 (*)    All other components within normal limits  POCT I-STAT 3, ART BLOOD GAS (G3+) - Abnormal; Notable for the following:    pH, Arterial 7.345 (*)    pO2, Arterial 187.0 (*)    Bicarbonate 19.1 (*)    Acid-base deficit 6.0 (*)    All other components within normal limits  URINE CULTURE  CULTURE, BLOOD (ROUTINE X 2)  CULTURE, BLOOD (ROUTINE X 2)  CULTURE, RESPIRATORY (NON-EXPECTORATED)  ETHANOL  TSH  MAGNESIUM  MAGNESIUM    PHOSPHORUS  LACTIC ACID, PLASMA  TROPONIN I  PROCALCITONIN  APTT  MAGNESIUM  CORTISOL  BASIC METABOLIC PANEL  BASIC METABOLIC PANEL  BASIC METABOLIC PANEL  MAGNESIUM  MAGNESIUM  MAGNESIUM  PHOSPHORUS  PHOSPHORUS  PHOSPHORUS  URINE DRUGS OF ABUSE SCREEN W ALC, ROUTINE (REF LAB)  I-STAT VENOUS BLOOD GAS, ED  I-STAT ARTERIAL BLOOD GAS, ED  I-STAT ARTERIAL BLOOD GAS, ED  TYPE AND SCREEN    EKG  EKG Interpretation  Date/Time:  Saturday August 04 2017 17:09:12 EDT Ventricular Rate:  64 PR Interval:    QRS Duration: 103 QT Interval:  431 QTC Calculation: 445 R Axis:   45 Text Interpretation:  remove too much artifact Confirmed by Ripley Fraise [phone]) on 08/05/2017 1:00:02 PM       Radiology Ct Head Wo Contrast  Result Date: 08/04/2017 CLINICAL DATA:  Altered mental status. EXAM: CT HEAD WITHOUT CONTRAST TECHNIQUE: Contiguous axial images were obtained from the base of the skull through the vertex without intravenous contrast. COMPARISON:  07/05/2007 FINDINGS: Brain: No evidence of acute infarction, hemorrhage, hydrocephalus, extra-axial collection or mass lesion/mass effect. Vascular: No hyperdense vessel or unexpected calcification. Skull: Normal. Negative for fracture or focal lesion. Sinuses/Orbits: No acute finding. Other: None. IMPRESSION: No acute intracranial abnormality. Electronically Signed   By: Fidela Salisbury M.D.   On: 08/04/2017 19:03   Dg Chest Portable 1 View  Result Date: 08/04/2017 CLINICAL DATA:  Intubated EXAM: PORTABLE CHEST 1 VIEW COMPARISON:  Chest radiograph from earlier today. FINDINGS: Endotracheal tube tip is 6.5 cm above the carina. Enteric tube terminates in the proximal stomach with the side port in the lower thoracic esophagus. Stable cardiomediastinal silhouette with normal heart size. No pneumothorax. No pleural effusion. Mild bibasilar atelectasis. No pulmonary edema. IMPRESSION: 1. Endotracheal tube tip 6.5 cm above the carina. 2.  Enteric tube terminates in the proximal stomach with the side port of the lower thoracic esophagus, consider advancing 8-10 cm. 3. Mild right basilar atelectasis . Electronically Signed   By: Ilona Sorrel M.D.   On: 08/04/2017 19:47   Dg Chest Portable 1 View  Result Date: 08/04/2017 CLINICAL DATA:  Hyperglycemia. EXAM: PORTABLE CHEST 1 VIEW COMPARISON:  July 30, 2017 FINDINGS: The heart size and mediastinal contours are within normal limits. Both lungs are clear. The visualized skeletal structures are unremarkable. IMPRESSION: No active disease. Electronically Signed   By: Dorise Bullion III M.D   On: 08/04/2017 16:27   Dg Abd Portable 1v  Result Date: 08/04/2017 CLINICAL DATA:  Orogastric tube placement. EXAM: PORTABLE ABDOMEN - 1 VIEW COMPARISON:  07/05/2018 FINDINGS: The bowel gas pattern is normal. No radio-opaque calculi or other significant radiographic abnormality are seen. Orogastric tube descends inferior to the left hemidiaphragm in the expected location of gastric body. IMPRESSION: Orogastric tube likely within gastric body. Electronically Signed   By: Fidela Salisbury M.D.   On: 08/04/2017 23:29    Procedures .Central  Line Date/Time: 08/04/2017 6:04 PM Performed by: Arnetha Massy Authorized by: Arnetha Massy   Consent:    Consent obtained:  Emergent situation Pre-procedure details:    Hand hygiene: Hand hygiene performed prior to insertion     Sterile barrier technique: All elements of maximal sterile technique followed     Skin preparation:  2% chlorhexidine   Skin preparation agent: Skin preparation agent completely dried prior to procedure   Procedure details:    Location:  L femoral   Patient position:  Flat   Procedural supplies:  Triple lumen   Landmarks identified: yes     Ultrasound guidance: yes     Sterile ultrasound techniques: Sterile gel and sterile probe covers were used     Number of attempts:  2   Successful placement: yes   Post-procedure  details:    Post-procedure:  Dressing applied and line sutured   Assessment:  Blood return through all ports and free fluid flow   Patient tolerance of procedure:  Tolerated well, no immediate complications  ARTERIAL LINE Date/Time: 08/05/2017 2:34 PM Performed by: Arnetha Massy Authorized by: Arnetha Massy   Consent:    Consent obtained:  Emergent situation Indications:    Indications: hemodynamic monitoring   Pre-procedure details:    Skin preparation:  2% Chlorhexidine Anesthesia (see MAR for exact dosages):    Anesthesia method:  Local infiltration   Local anesthetic:  Lidocaine 2% w/o epi Procedure details:    Location:  R radial   Placement technique:  Ultrasound guided   Number of attempts:  1   Transducer: waveform confirmed   Post-procedure details:    Post-procedure:  Biopatch applied, secured with tape and sterile dressing applied   CMS:  Normal   Patient tolerance of procedure:  Tolerated well, no immediate complications Procedure Name: Intubation Date/Time: 08/05/2017 2:35 PM Performed by: Arnetha Massy Pre-anesthesia Checklist: Patient identified, Emergency Drugs available, Suction available, Patient being monitored and Timeout performed Oxygen Delivery Method: Ambu bag Preoxygenation: Pre-oxygenation with 100% oxygen Induction Type: Rapid sequence Laryngoscope Size: Glidescope and 3 Grade View: Grade I Tube size: 7.5 mm Number of attempts: 1 Airway Equipment and Method: Video-laryngoscopy Placement Confirmation: ETT inserted through vocal cords under direct vision,  Positive ETCO2,  CO2 detector and Breath sounds checked- equal and bilateral Secured at: 24 cm Tube secured with: ETT holder Dental Injury: Teeth and Oropharynx as per pre-operative assessment       (including critical care time)  Medications Ordered in ED Medications  insulin regular (NOVOLIN R,HUMULIN R) 100 Units in sodium chloride 0.9 % 100 mL (1 Units/mL) infusion (8.3 Units/hr  Intravenous Rate/Dose Change 08/05/17 1442)  0.9 %  sodium chloride infusion (not administered)  heparin injection 5,000 Units (5,000 Units Subcutaneous Given 08/05/17 1400)  famotidine (PEPCID) IVPB 20 mg premix (0 mg Intravenous Stopped 08/05/17 1129)  0.9 %  sodium chloride infusion ( Intravenous Stopped 08/04/17 2130)  dextrose 5 %-0.45 % sodium chloride infusion ( Intravenous New Bag/Given 08/05/17 1250)  0.9 %  sodium chloride infusion ( Intravenous Stopped 08/05/17 1303)  fentaNYL (SUBLIMAZE) injection 100 mcg (100 mcg Intravenous Given 08/05/17 0948)  docusate (COLACE) 50 MG/5ML liquid 100 mg (not administered)  piperacillin-tazobactam (ZOSYN) IVPB 3.375 g (0 g Intravenous Stopped 08/05/17 1231)  vancomycin (VANCOCIN) IVPB 1000 mg/200 mL premix (0 mg Intravenous Stopped 08/05/17 0626)  levETIRAcetam (KEPPRA) 500 mg in sodium chloride 0.9 % 100 mL IVPB (0 mg Intravenous Stopped 08/05/17 1051)  dexmedetomidine (PRECEDEX) 400 MCG/100ML (4 mcg/mL) infusion (  0 mcg/kg/hr  77 kg Intravenous Stopped 08/05/17 0243)  norepinephrine (LEVOPHED) 16 mg in dextrose 5 % 250 mL (0.064 mg/mL) infusion (15 mcg/min Intravenous Rate/Dose Change 08/05/17 1435)  chlorhexidine gluconate (MEDLINE KIT) (PERIDEX) 0.12 % solution 15 mL (15 mLs Mouth Rinse Given 08/05/17 0747)  MEDLINE mouth rinse (15 mLs Mouth Rinse Given 08/05/17 1147)  vasopressin (PITRESSIN) 40 Units in sodium chloride 0.9 % 250 mL (0.16 Units/mL) infusion (not administered)  phenylephrine (NEO-SYNEPHRINE) 10 mg in sodium chloride 0.9 % 250 mL (0.04 mg/mL) infusion (not administered)  potassium chloride 10 mEq in 50 mL *CENTRAL LINE* IVPB (0 mEq Intravenous Stopped 08/05/17 1454)  potassium chloride 20 MEQ/15ML (10%) solution 40 mEq (40 mEq Oral Given 08/05/17 1245)  sodium chloride 0.9 % bolus 1,000 mL (0 mLs Intravenous Stopped 08/04/17 1827)    And  sodium chloride 0.9 % bolus 1,000 mL (0 mLs Intravenous Stopped 08/04/17 1730)  albuterol (PROVENTIL) (2.5  MG/3ML) 0.083% nebulizer solution 10 mg (10 mg Nebulization Given 08/04/17 1544)  insulin aspart (novoLOG) injection 10 Units (10 Units Intravenous Given 08/04/17 1622)  sodium bicarbonate injection 50 mEq (50 mEq Intravenous Given 08/04/17 1615)  piperacillin-tazobactam (ZOSYN) IVPB 3.375 g (0 g Intravenous Stopped 08/04/17 1757)  sodium chloride 0.9 % bolus 1,000 mL (0 mLs Intravenous Stopped 08/04/17 1828)  calcium chloride injection 1 g (1 g Intravenous Given 08/04/17 1643)  calcium gluconate 1 g in sodium chloride 0.9 % 100 mL IVPB (0 g Intravenous Stopped 08/04/17 1901)  sodium bicarbonate injection 50 mEq (50 mEq Intravenous Given 08/04/17 1645)  vancomycin (VANCOCIN) 1,500 mg in sodium chloride 0.9 % 500 mL IVPB (0 mg Intravenous Stopped 08/04/17 2209)  sodium bicarbonate 1 mEq/mL injection (50 mEq  Given 08/04/17 1911)  sodium bicarbonate injection 50 mEq (0 mEq Intravenous Duplicate 08/04/17 1915)  etomidate (AMIDATE) injection 20 mg (20 mg Intravenous Given 08/04/17 1913)  rocuronium (ZEMURON) injection 100 mg (100 mg Intravenous Given 08/04/17 1915)  LORazepam (ATIVAN) injection 2 mg (0 mg Intravenous Duplicate 08/04/17 1930)  potassium chloride 10 mEq in 100 mL IVPB (0 mEq Intravenous Stopped 08/05/17 0037)  fentaNYL (SUBLIMAZE) injection 100 mcg (100 mcg Intravenous Given 08/05/17 1127)  lidocaine (PF) (XYLOCAINE) 1 % injection 5 mL (5 mLs Intradermal Given 08/04/17 2003)  sodium bicarbonate 150 mEq in sterile water 1,000 mL infusion ( Intravenous Stopped 08/04/17 2357)  sodium chloride 0.9 % bolus 1,000 mL (0 mLs Intravenous Stopped 08/05/17 0330)  potassium chloride 10 mEq in 50 mL *CENTRAL LINE* IVPB (0 mEq Intravenous Stopped 08/05/17 0852)  potassium chloride 20 MEQ/15ML (10%) solution 40 mEq (40 mEq Per Tube Given 08/05/17 0300)  sodium phosphate 30 mmol in dextrose 5 % 250 mL infusion (0 mmol Intravenous Stopped 08/05/17 1015)  sodium chloride 0.9 % bolus 500 mL (500 mLs Intravenous Bolus from  Bag 08/05/17 0831)  magnesium sulfate IVPB 4 g 100 mL (0 g Intravenous Stopped 08/05/17 1117)  sodium chloride 0.9 % bolus 500 mL (500 mLs Intravenous New Bag/Given 08/05/17 1256)     Initial Impression / Assessment and Plan / ED Course  I have reviewed the triage vital signs and the nursing notes.  Pertinent labs & imaging results that were available during my care of the patient were reviewed by me and considered in my medical decision making (see chart for details).     Patient is a [name] with history of poorly controlled type I DM, noncomplicance, recurrent DKA, schizoaffective disorder, substance abuse who presents with altered  mental status. Patient with multiple admissions for DKA. Last admitted 8/13, left AMA the following day. Per EMS they were called out at 2:30 in the morning, patient found to be tachypneic to 40s and blood sugar in 500s, but patient refused transport to ED. Called again this morning when patient found unresponsive. Patient arrived tachypneic, hypotensive, hypothermic, responsive to pain only. Encephalopathic. IV access via ultrasound obtained. EKG showing peaked T waves, temporizing treatment with calcium, albuterol and insulin given.  Labs consistent with severe DKA - initial pH 6.6 with bicarb <7, Na 125, K 6.3. WBC 51k, lactate elevated to 3.  CT head and CXR negative for acute findings.   Code sepsis initiated, vanc and zosyn empirically started. DKA protocol with insulin drip initiated. Due to severe acidosis, bicarb drip was also initiated. After 3-4L NS IVF given with persistent hypotension, levophed started with improvement of MAPs.   After head CT, patient had episode of decreased responsiveness, and witnessed posturing of the upper extremities. Decision was made to intubate the patient for airway protection. Prior to intubation he was given 2 mg of Ativan with minimal improvement. See procedure note for RSI.  While in the ED, central axis and arterial  line were placed. Discussed with Critical Care for admission for encephalopathy in setting of severe DKA and sepsis.   Patient and plan of care discussed with Attending physician, Dr. Reather Converse.    Final Clinical Impressions(s) / ED Diagnoses   Final diagnoses:  Acute respiratory acidosis    New Prescriptions Current Discharge Medication List       Arnetha Massy, MD 08/05/17 1445    Elnora Morrison, MD 08/07/17 Bosie Helper

## 2017-08-04 NOTE — ED Notes (Signed)
Bair hugger applied to pt.  Pt receiving HHN via mask

## 2017-08-04 NOTE — ED Notes (Signed)
Dr. Sloan Leiter preparing for central line at this time. Two previous ultrasound IV's in bil arms infiltrated.  Both IV's d'cd

## 2017-08-04 NOTE — ED Notes (Signed)
Central line placed in left groin

## 2017-08-04 NOTE — Procedures (Signed)
Radial A line insertion  Indication: shock, resp failure and metabolic acidosis  Done by the ED resident under my supervision  Complete sterile condition, time out Local lidocaine, US guided insertion of arterial line  Patient tolerated the procedure well  Oswaldo Milian, MD CCM attending

## 2017-08-04 NOTE — Progress Notes (Signed)
Pharmacy Antibiotic Note  [name] is a 31 y.o. male admitted on 08/04/2017 with DKA. Pt was recently admitted + discharged earlier this week for the same thing. Pt unresponsive upon arrival to the ED. WBC 51, hypothermic + hypotensive, LA 3.4. Starting empiric abx.   Plan: -Vancomycin 1500 mg IV x1 then 1g/12h -Zosyn 3.375 g IV q8h -Monitor renal fx, cultures, VT at Css    Temp (24hrs), Avg:86 F (30 C), Min:85.5 F (29.7 C), Max:87.3 F (30.7 C)   Recent Labs Lab 07/30/17 0846  07/30/17 1504  07/31/17 0004 07/31/17 0300 08/04/17 1545 08/04/17 1551 08/04/17 1906 08/04/17 1912  WBC 26.1*  --   --   --   --  15.0* 51.0*  --   --   --   CREATININE 3.88*  < > 2.80*  < > 1.45* 1.28* 2.50* 1.70*  --  1.50*  LATICACIDVEN  --   --  2.4*  --   --   --   --  3.18* 3.47*  --   < > = values in this interval not displayed.  Estimated Creatinine Clearance: 77 mL/min (A) (by C-G formula based on SCr of 1.5 mg/dL (H)).    Allergies  Allergen Reactions  . Sulfa Antibiotics Other (See Comments)    Unknown childhood allergy    Antimicrobials this admission: 8/18 vancomycin > 8/18 zosyn >  Dose adjustments this admission: N/A  Microbiology results: 8/18 blood cx: 8/18 urine cx:  8/18 resp cx:    [name] 08/04/2017 7:32 PM

## 2017-08-04 NOTE — Consult Note (Signed)
Indication for ICU admission: Encephalopathy, possible seizure, acute hypoxic resp failure and severe metabolic acidosis  In summary: 31 yo M with Hx of type 1DM, multiple admissions for DKA presented because of unresponsiveness, had seizure like activity in ED. Received ativan and intubated for airway protection. Patient found to have severe anion gap acidosis due to DKA and lactic acidosis  On my assessment: While on norepi Hypothermic (33.1), BP 102/63 (76), HR 84, RR 20 On mechanical ventilation, does not appear in distress Not responding, just received RSI for intubation S1S2 regular, no murmur Lungs are diminished cackles at the bases Abdomen soft, not tender, sluggish BS Cold, positive pulses  I reviewed labs and imaging  Patient is critically ill in the ICU and I am managing the patient for the following 1. Acute hypoxic resp failure 2. Metabolic acidosis, DKA and lactic acidosis 3. Septic shock 4. AKI 5. Hyperammoniemia 6. Hyperkalemia  Plan: - admit to the ICU, no sedatives for now, if needed dex - CT of the head is negative, Keppra and EEG, consult neurology - follow ABG after intubation - volume resuscitation, insulin drip, follow BMP, Phos and mg q4hrs - send cultures, broad abx, follow lactic acid - OGT to LIS, NPO, lactulose - on sodium bicarb drip, follow BMP and re adjust IVF - SQH, SCDs and famotidine  I spent 45 min of critical care time managing the patient   Oswaldo Milian, MD CCM attending

## 2017-08-04 NOTE — Progress Notes (Signed)
Vent settings changed to cpap PS by MD.

## 2017-08-04 NOTE — ED Notes (Signed)
Patient transported to CT 

## 2017-08-04 NOTE — ED Triage Notes (Signed)
EMS reports they were called out this am at 2:30am for pt being hyperglycemic.  Pt refused transport at time.  St's they were called again and when they arrived pt was unresponsive with B/P 102/33 R. 40 and 92% sat on RA.  On arrival to ED pt remains unresponsive with moaning resp.  Color pale, skin cold and dry.

## 2017-08-04 NOTE — Progress Notes (Signed)
eLink Physician-Brief Progress Note Patient Name: [name] DOB: [birth year] MRN: [id number]   Date of Service  08/04/2017  HPI/Events of Note  Remains with severe metabolic acidosis.  On bicarb drip.  On insulin drip per DKA protocol.  eICU Interventions  No change at this time     Intervention Category Major Interventions: Acid-Base disturbance - evaluation and management  Henry Russel, P 08/04/2017, 10:30 PM

## 2017-08-04 NOTE — ED Notes (Signed)
Dr. Jodi Mourning in to attempt ultrasound IV.  Pt remains unresponsive.

## 2017-08-05 DIAGNOSIS — E872 Acidosis: Secondary | ICD-10-CM

## 2017-08-05 DIAGNOSIS — J9602 Acute respiratory failure with hypercapnia: Secondary | ICD-10-CM

## 2017-08-05 DIAGNOSIS — E0810 Diabetes mellitus due to underlying condition with ketoacidosis without coma: Secondary | ICD-10-CM

## 2017-08-05 DIAGNOSIS — Z0189 Encounter for other specified special examinations: Secondary | ICD-10-CM

## 2017-08-05 LAB — GLUCOSE, CAPILLARY
GLUCOSE-CAPILLARY: 375 mg/dL — AB (ref 65–99)
GLUCOSE-CAPILLARY: 346 mg/dL — AB (ref 65–99)
GLUCOSE-CAPILLARY: 217 mg/dL — AB (ref 65–99)
GLUCOSE-CAPILLARY: 183 mg/dL — AB (ref 65–99)
GLUCOSE-CAPILLARY: 199 mg/dL — AB (ref 65–99)
GLUCOSE-CAPILLARY: 119 mg/dL — AB (ref 65–99)
GLUCOSE-CAPILLARY: 155 mg/dL — AB (ref 65–99)
GLUCOSE-CAPILLARY: 168 mg/dL — AB (ref 65–99)
GLUCOSE-CAPILLARY: 160 mg/dL — AB (ref 65–99)
GLUCOSE-CAPILLARY: 148 mg/dL — AB (ref 65–99)
Glucose-Capillary: 375 mg/dL — ABNORMAL HIGH (ref 65–99)
Glucose-Capillary: 381 mg/dL — ABNORMAL HIGH (ref 65–99)
Glucose-Capillary: 320 mg/dL — ABNORMAL HIGH (ref 65–99)
Glucose-Capillary: 300 mg/dL — ABNORMAL HIGH (ref 65–99)
Glucose-Capillary: 269 mg/dL — ABNORMAL HIGH (ref 65–99)
Glucose-Capillary: 280 mg/dL — ABNORMAL HIGH (ref 65–99)
Glucose-Capillary: 241 mg/dL — ABNORMAL HIGH (ref 65–99)
Glucose-Capillary: 151 mg/dL — ABNORMAL HIGH (ref 65–99)
Glucose-Capillary: 125 mg/dL — ABNORMAL HIGH (ref 65–99)
Glucose-Capillary: 123 mg/dL — ABNORMAL HIGH (ref 65–99)
Glucose-Capillary: 143 mg/dL — ABNORMAL HIGH (ref 65–99)

## 2017-08-05 LAB — BASIC METABOLIC PANEL
ANION GAP: 24 — AB (ref 5–15)
ANION GAP: 6 (ref 5–15)
ANION GAP: 7 (ref 5–15)
ANION GAP: 9 (ref 5–15)
Anion gap: 12 (ref 5–15)
BUN: 24 mg/dL — ABNORMAL HIGH (ref 6–20)
BUN: 22 mg/dL — AB (ref 6–20)
BUN: 20 mg/dL (ref 6–20)
BUN: 17 mg/dL (ref 6–20)
BUN: 16 mg/dL (ref 6–20)
CALCIUM: 8.1 mg/dL — AB (ref 8.9–10.3)
CALCIUM: 8.2 mg/dL — AB (ref 8.9–10.3)
CALCIUM: 8.1 mg/dL — AB (ref 8.9–10.3)
CHLORIDE: 108 mmol/L (ref 101–111)
CHLORIDE: 111 mmol/L (ref 101–111)
CHLORIDE: 118 mmol/L — AB (ref 101–111)
CHLORIDE: 116 mmol/L — AB (ref 101–111)
CO2: 9 mmol/L — ABNORMAL LOW (ref 22–32)
CO2: 18 mmol/L — AB (ref 22–32)
CO2: 23 mmol/L (ref 22–32)
CO2: 23 mmol/L (ref 22–32)
CO2: 19 mmol/L — AB (ref 22–32)
CREATININE: 2.07 mg/dL — AB (ref 0.61–1.24)
CREATININE: 1.81 mg/dL — AB (ref 0.61–1.24)
Calcium: 8.4 mg/dL — ABNORMAL LOW (ref 8.9–10.3)
Calcium: 8.2 mg/dL — ABNORMAL LOW (ref 8.9–10.3)
Chloride: 115 mmol/L — ABNORMAL HIGH (ref 101–111)
Creatinine, Ser: 2.32 mg/dL — ABNORMAL HIGH (ref 0.61–1.24)
Creatinine, Ser: 1.5 mg/dL — ABNORMAL HIGH (ref 0.61–1.24)
Creatinine, Ser: 1.47 mg/dL — ABNORMAL HIGH (ref 0.61–1.24)
GFR calc Af Amer: 47 mL/min — ABNORMAL LOW (ref >60)
GFR calc non Af Amer: 41 mL/min — ABNORMAL LOW (ref >60)
GFR calc non Af Amer: 48 mL/min — ABNORMAL LOW (ref >60)
GFR calc non Af Amer: >60 mL/min (ref >60)
GFR calc non Af Amer: >60 mL/min (ref >60)
GFR, EST AFRICAN AMERICAN: 41 mL/min — AB (ref >60)
GFR, EST AFRICAN AMERICAN: 55 mL/min — AB (ref >60)
GFR, EST NON AFRICAN AMERICAN: 35 mL/min — AB (ref >60)
GLUCOSE: 192 mg/dL — AB (ref 65–99)
Glucose, Bld: 391 mg/dL — ABNORMAL HIGH (ref 65–99)
Glucose, Bld: 287 mg/dL — ABNORMAL HIGH (ref 65–99)
Glucose, Bld: 132 mg/dL — ABNORMAL HIGH (ref 65–99)
Glucose, Bld: 163 mg/dL — ABNORMAL HIGH (ref 65–99)
POTASSIUM: 2.9 mmol/L — AB (ref 3.5–5.1)
POTASSIUM: 3.6 mmol/L (ref 3.5–5.1)
Potassium: 2.7 mmol/L — CL (ref 3.5–5.1)
Potassium: 2.4 mmol/L — CL (ref 3.5–5.1)
Potassium: 4.3 mmol/L (ref 3.5–5.1)
SODIUM: 141 mmol/L (ref 135–145)
Sodium: 141 mmol/L (ref 135–145)
Sodium: 144 mmol/L (ref 135–145)
Sodium: 148 mmol/L — ABNORMAL HIGH (ref 135–145)
Sodium: 144 mmol/L (ref 135–145)

## 2017-08-05 LAB — POCT I-STAT 3, ART BLOOD GAS (G3+)
ACID-BASE DEFICIT: 19 mmol/L — AB (ref 0.0–2.0)
Acid-base deficit: 21 mmol/L — ABNORMAL HIGH (ref 0.0–2.0)
Acid-base deficit: 6 mmol/L — ABNORMAL HIGH (ref 0.0–2.0)
BICARBONATE: 19.1 mmol/L — AB (ref 20.0–28.0)
Bicarbonate: 5.7 mmol/L — ABNORMAL LOW (ref 20.0–28.0)
Bicarbonate: 7.1 mmol/L — ABNORMAL LOW (ref 20.0–28.0)
O2 SAT: 100 %
O2 SAT: 99 %
O2 Saturation: 99 %
PCO2 ART: 35.1 mmHg (ref 32.0–48.0)
PH ART: 7.202 — AB (ref 7.350–7.450)
PH ART: 7.235 — AB (ref 7.350–7.450)
Patient temperature: 34.1
TCO2: 20 mmol/L (ref 0–100)
TCO2: 6 mmol/L (ref 0–100)
TCO2: 8 mmol/L (ref 0–100)
pCO2 arterial: 16.6 mmHg — CL (ref 32.0–48.0)
pH, Arterial: 7.345 — ABNORMAL LOW (ref 7.350–7.450)
pO2, Arterial: 136 mmHg — ABNORMAL HIGH (ref 83.0–108.0)
pO2, Arterial: 156 mmHg — ABNORMAL HIGH (ref 83.0–108.0)
pO2, Arterial: 187 mmHg — ABNORMAL HIGH (ref 83.0–108.0)

## 2017-08-05 LAB — MAGNESIUM
MAGNESIUM: 1.4 mg/dL — AB (ref 1.7–2.4)
MAGNESIUM: 1.2 mg/dL — AB (ref 1.7–2.4)
MAGNESIUM: 2.3 mg/dL (ref 1.7–2.4)
Magnesium: 2.4 mg/dL (ref 1.7–2.4)
Magnesium: 2.1 mg/dL (ref 1.7–2.4)

## 2017-08-05 LAB — PHOSPHORUS
Phosphorus: <1 mg/dL — CL (ref 2.5–4.6)
Phosphorus: <1 mg/dL — CL (ref 2.5–4.6)
Phosphorus: <1 mg/dL — CL (ref 2.5–4.6)

## 2017-08-05 LAB — LACTIC ACID, PLASMA
LACTIC ACID, VENOUS: 2.8 mmol/L — AB (ref 0.5–1.9)
Lactic Acid, Venous: 2.5 mmol/L (ref 0.5–1.9)

## 2017-08-05 LAB — CBC
HCT: 29 % — ABNORMAL LOW (ref 39.0–52.0)
HEMOGLOBIN: 10.4 g/dL — AB (ref 13.0–17.0)
MCH: 28.4 pg (ref 26.0–34.0)
MCHC: 35.9 g/dL (ref 30.0–36.0)
MCV: 79.2 fL (ref 78.0–100.0)
PLATELETS: 344 10*3/uL (ref 150–400)
RBC: 3.66 MIL/uL — AB (ref 4.22–5.81)
RDW: 13.1 % (ref 11.5–15.5)
WBC: 20.8 10*3/uL — AB (ref 4.0–10.5)

## 2017-08-05 LAB — URINE CULTURE: Culture: NO GROWTH

## 2017-08-05 LAB — CORTISOL: Cortisol, Plasma: 21.7 ug/dL

## 2017-08-05 MED ORDER — SODIUM CHLORIDE 0.9 % IV BOLUS (SEPSIS)
1000.0000 mL | Freq: Once | INTRAVENOUS | Status: AC
  Administered 2017-08-05: 1000 mL via INTRAVENOUS

## 2017-08-05 MED ORDER — POTASSIUM CHLORIDE 20 MEQ/15ML (10%) PO SOLN
40.0000 meq | Freq: Every day | ORAL | Status: DC
  Administered 2017-08-05 – 2017-08-07 (×3): 40 meq via ORAL
  Filled 2017-08-05 (×5): qty 30

## 2017-08-05 MED ORDER — NOREPINEPHRINE BITARTRATE 1 MG/ML IV SOLN
5.0000 ug/min | INTRAVENOUS | Status: DC
  Administered 2017-08-05: 18 ug/min via INTRAVENOUS
  Administered 2017-08-05: 50 ug/min via INTRAVENOUS
  Filled 2017-08-05 (×2): qty 16

## 2017-08-05 MED ORDER — VASOPRESSIN 20 UNIT/ML IV SOLN
0.0300 [IU]/min | INTRAVENOUS | Status: DC
  Filled 2017-08-05: qty 2

## 2017-08-05 MED ORDER — SODIUM CHLORIDE 0.9 % IV BOLUS (SEPSIS)
500.0000 mL | Freq: Once | INTRAVENOUS | Status: AC
  Administered 2017-08-05: 500 mL via INTRAVENOUS

## 2017-08-05 MED ORDER — SODIUM PHOSPHATES 45 MMOLE/15ML IV SOLN
40.0000 meq | Freq: Once | INTRAVENOUS | Status: AC
  Administered 2017-08-05: 40 meq via INTRAVENOUS
  Filled 2017-08-05: qty 10

## 2017-08-05 MED ORDER — SODIUM CHLORIDE 0.9 % IV SOLN
0.0000 ug/min | INTRAVENOUS | Status: DC
  Filled 2017-08-05: qty 1

## 2017-08-05 MED ORDER — MAGNESIUM SULFATE 4 GM/100ML IV SOLN
4.0000 g | Freq: Once | INTRAVENOUS | Status: AC
  Administered 2017-08-05: 4 g via INTRAVENOUS
  Filled 2017-08-05: qty 100

## 2017-08-05 MED ORDER — CHLORHEXIDINE GLUCONATE 0.12% ORAL RINSE (MEDLINE KIT)
15.0000 mL | Freq: Two times a day (BID) | OROMUCOSAL | Status: DC
  Administered 2017-08-05 – 2017-08-07 (×6): 15 mL via OROMUCOSAL

## 2017-08-05 MED ORDER — SODIUM CHLORIDE 0.9 % IV BOLUS (SEPSIS)
500.0000 mL | Freq: Once | INTRAVENOUS | Status: AC
  Administered 2017-08-05 (×2): 500 mL via INTRAVENOUS

## 2017-08-05 MED ORDER — POTASSIUM CHLORIDE 20 MEQ/15ML (10%) PO SOLN
40.0000 meq | Freq: Once | ORAL | Status: AC
  Administered 2017-08-05: 40 meq
  Filled 2017-08-05: qty 30

## 2017-08-05 MED ORDER — POTASSIUM CHLORIDE 10 MEQ/50ML IV SOLN
10.0000 meq | INTRAVENOUS | Status: AC
  Administered 2017-08-05 (×6): 10 meq via INTRAVENOUS
  Filled 2017-08-05 (×6): qty 50

## 2017-08-05 MED ORDER — ORAL CARE MOUTH RINSE
15.0000 mL | Freq: Four times a day (QID) | OROMUCOSAL | Status: DC
  Administered 2017-08-05 – 2017-08-07 (×9): 15 mL via OROMUCOSAL

## 2017-08-05 MED ORDER — SODIUM PHOSPHATES 45 MMOLE/15ML IV SOLN
30.0000 mmol | Freq: Once | INTRAVENOUS | Status: AC
  Administered 2017-08-05: 30 mmol via INTRAVENOUS
  Filled 2017-08-05: qty 10

## 2017-08-05 NOTE — Progress Notes (Signed)
CRITICAL VALUE ALERT  Critical Value: K+ 2.7, Phos <1  Date & Time Notied:  08/05/17 @0935   Provider Notified: Dr. 

## 2017-08-05 NOTE — Progress Notes (Signed)
CRITICAL VALUE ALERT  Critical Value: K+ 2.4 phos <1  Date & Time Notied:  08/05/17 @1226   Provider Notified: Dr.  Orders Received/Actions taken: 6runs of K+ and [name] k+ via OG tube.

## 2017-08-05 NOTE — Progress Notes (Signed)
eLink Physician-Brief Progress Note Patient Name: [name] DOB: 1986-10-26 MRN: [id number]   Date of Service  08/05/2017  HPI/Events of Note  Progressive shock despite improvement in acidosis, decreasing his precedex. norepi has been uptitrated. Suspect he remains severely volume down -will bolus and adjust rate IVF. Decrease bicarb w a goal of having it off asap. Add vasopressin and phenylephrine although I believe he is already vasoconstricted, likely will not help much. He needs a TTE in am - could have a cardiogenic component (was ok in 6/18). Only apparent source sepsis would be ankle wound. Will need to work up further, ? eval osteo  eICU Interventions       Intervention Category Major Interventions: Acid-Base disturbance - evaluation and management;Shock - evaluation and management  BYRUM,ROBERT S. 08/05/2017, 2:52 AM

## 2017-08-05 NOTE — Progress Notes (Signed)
CRITICAL VALUE ALERT  Critical Value: Phos less than 1.   Date & Time Notied: 08/05/17  Provider Notified: Dr. Katrinka Blazing  Orders Received/Actions taken: see [facility] ([facility])

## 2017-08-05 NOTE — Consult Note (Signed)
31 yo M with Hx of type 1DM, multiple admissions for DKA presented because of unresponsiveness, had seizure like activity in ED. Received ativan and intubated for airway protection. Patient found to have severe anion gap acidosis due to DKA and lactic acidosis   Vitals:   08/05/17 0700 08/05/17 0730  BP: 100/66 98/65  Pulse: (!) 136 (!) 139  Resp: 16 (!) 21  Temp: (!) 100.4 F (38 C) (!) 100.4 F (38 C)  SpO2: 100% 100%    General: rass -2, fc Neuro: FC moves etx HEENT: jvd down, ett PULM: CTA CV: s1 s2 RRT GI: soft, bs wnl  No , no g Extremities: no ede,a rash on back erythema macular   Patient is critically ill in the ICU and I am managing the patient for the following 1. Acute hypoxic resp failure 2. Metabolic acidosis, DKA and lactic acidosis 3. Septic shock 4. AKI 5. Hyperammoniemia 6. Hyperkalemia 7. Hyppmag 8. hypophos  Plan: -ABG reviewed, maintaining PS 10, may need slight improved support -had dyshcrony on rate prvc, repeat abg now -weaning an option today would consider extubation if ph greater 7.25 with PS 5 ( need to improve BP) -may need neck line and cvp -pcxr in am  -tachy, pos balance consider bolus further -dc fem line asap, likely in am  -lactic from dka and hypovolemia -may need hemodynamic noninvasive monitor -replace K  -dc bicarb as K corrected -frequent bmet, on insulin drip supp phos aggressive, supp mag -pct unimpressive with dka stress, do not see active infection -may need addition vasopressin -wbc was hemoconcentration likely as main issue -advance OGT, feed inf not extubated in am  nosocomial exposure, maintain empiric vanc zosyn, likely to dc in am , follow BC No family at bedside  Ccm time 35 min  Mcarthur Rossetti. Tyson Alias, MD, FACP Pgr: [phone] [facility]

## 2017-08-05 NOTE — Progress Notes (Signed)
eLink Physician-Brief Progress Note Patient Name: OZELL JUHASZ DOB: 1986/09/23 MRN: [id number]   Date of Service  08/05/2017  HPI/Events of Note  Phos less than 1.    eICU Interventions  Sodium phos ordered     Intervention Category Minor Interventions: Electrolytes abnormality - evaluation and management  Henry Russel, P 08/05/2017, 6:51 PM

## 2017-08-05 NOTE — Progress Notes (Signed)
eLink Physician-Brief Progress Note Patient Name: [name] DOB: 1986/10/27 MRN: [id number]   Date of Service  08/05/2017  HPI/Events of Note    eICU Interventions  Phos replacement     Intervention Category Minor Interventions: Electrolytes abnormality - evaluation and management  BYRUM,ROBERT S. 08/05/2017, 3:44 AM

## 2017-08-05 NOTE — Procedures (Signed)
Placed back on full support due to sedation & back up mode of ventilation activated.

## 2017-08-05 NOTE — Progress Notes (Signed)
CRITICAL VALUE ALERT  Critical Value:  Lactic acid 2.8  Date & Time Notied: 08/05/17 @1000   Provider Notified: Dr. 

## 2017-08-05 NOTE — Progress Notes (Signed)
CRITICAL VALUE ALERT  Critical Value:  Lactic 2.5 phos <1  Date & Time Notied:  08/05/17 0400  Provider Notified: Dr. Delton Coombes  Orders Received/Actions taken: see MAR

## 2017-08-06 ENCOUNTER — Inpatient Hospital Stay (HOSPITAL_COMMUNITY): Payer: Medicare Other

## 2017-08-06 DIAGNOSIS — G9340 Encephalopathy, unspecified: Secondary | ICD-10-CM

## 2017-08-06 DIAGNOSIS — J9601 Acute respiratory failure with hypoxia: Secondary | ICD-10-CM

## 2017-08-06 LAB — BASIC METABOLIC PANEL
ANION GAP: 7 (ref 5–15)
Anion gap: 6 (ref 5–15)
Anion gap: 7 (ref 5–15)
Anion gap: 10 (ref 5–15)
Anion gap: 8 (ref 5–15)
BUN: 17 mg/dL (ref 6–20)
BUN: 17 mg/dL (ref 6–20)
BUN: 19 mg/dL (ref 6–20)
BUN: 19 mg/dL (ref 6–20)
BUN: 19 mg/dL (ref 6–20)
CALCIUM: 7.9 mg/dL — AB (ref 8.9–10.3)
CHLORIDE: 122 mmol/L — AB (ref 101–111)
CHLORIDE: 118 mmol/L — AB (ref 101–111)
CHLORIDE: 120 mmol/L — AB (ref 101–111)
CO2: 15 mmol/L — AB (ref 22–32)
CO2: 17 mmol/L — AB (ref 22–32)
CO2: 17 mmol/L — AB (ref 22–32)
CO2: 20 mmol/L — ABNORMAL LOW (ref 22–32)
CO2: 21 mmol/L — ABNORMAL LOW (ref 22–32)
CREATININE: 1.49 mg/dL — AB (ref 0.61–1.24)
CREATININE: 1.34 mg/dL — AB (ref 0.61–1.24)
CREATININE: 1.39 mg/dL — AB (ref 0.61–1.24)
CREATININE: 1.26 mg/dL — AB (ref 0.61–1.24)
Calcium: 7.7 mg/dL — ABNORMAL LOW (ref 8.9–10.3)
Calcium: 7.6 mg/dL — ABNORMAL LOW (ref 8.9–10.3)
Calcium: 7.5 mg/dL — ABNORMAL LOW (ref 8.9–10.3)
Calcium: 7.7 mg/dL — ABNORMAL LOW (ref 8.9–10.3)
Chloride: 117 mmol/L — ABNORMAL HIGH (ref 101–111)
Chloride: 119 mmol/L — ABNORMAL HIGH (ref 101–111)
Creatinine, Ser: 1.44 mg/dL — ABNORMAL HIGH (ref 0.61–1.24)
GFR calc Af Amer: >60 mL/min (ref >60)
GFR calc Af Amer: >60 mL/min (ref >60)
GFR calc Af Amer: >60 mL/min (ref >60)
GFR calc Af Amer: >60 mL/min (ref >60)
GFR calc non Af Amer: >60 mL/min (ref >60)
GFR calc non Af Amer: >60 mL/min (ref >60)
GFR calc non Af Amer: >60 mL/min (ref >60)
GFR calc non Af Amer: >60 mL/min (ref >60)
GLUCOSE: 234 mg/dL — AB (ref 65–99)
GLUCOSE: 124 mg/dL — AB (ref 65–99)
GLUCOSE: 140 mg/dL — AB (ref 65–99)
GLUCOSE: 251 mg/dL — AB (ref 65–99)
Glucose, Bld: 153 mg/dL — ABNORMAL HIGH (ref 65–99)
POTASSIUM: 3.5 mmol/L (ref 3.5–5.1)
Potassium: 3.4 mmol/L — ABNORMAL LOW (ref 3.5–5.1)
Potassium: 4 mmol/L (ref 3.5–5.1)
Potassium: 4.2 mmol/L (ref 3.5–5.1)
Potassium: 4.2 mmol/L (ref 3.5–5.1)
SODIUM: 144 mmol/L (ref 135–145)
SODIUM: 146 mmol/L — AB (ref 135–145)
SODIUM: 143 mmol/L (ref 135–145)
Sodium: 145 mmol/L (ref 135–145)
Sodium: 146 mmol/L — ABNORMAL HIGH (ref 135–145)

## 2017-08-06 LAB — GLUCOSE, CAPILLARY
GLUCOSE-CAPILLARY: 129 mg/dL — AB (ref 65–99)
GLUCOSE-CAPILLARY: 139 mg/dL — AB (ref 65–99)
GLUCOSE-CAPILLARY: 157 mg/dL — AB (ref 65–99)
GLUCOSE-CAPILLARY: 152 mg/dL — AB (ref 65–99)
GLUCOSE-CAPILLARY: 203 mg/dL — AB (ref 65–99)
GLUCOSE-CAPILLARY: 191 mg/dL — AB (ref 65–99)
GLUCOSE-CAPILLARY: 145 mg/dL — AB (ref 65–99)
GLUCOSE-CAPILLARY: 127 mg/dL — AB (ref 65–99)
GLUCOSE-CAPILLARY: 159 mg/dL — AB (ref 65–99)
GLUCOSE-CAPILLARY: 225 mg/dL — AB (ref 65–99)
Glucose-Capillary: 111 mg/dL — ABNORMAL HIGH (ref 65–99)
Glucose-Capillary: 118 mg/dL — ABNORMAL HIGH (ref 65–99)
Glucose-Capillary: 121 mg/dL — ABNORMAL HIGH (ref 65–99)
Glucose-Capillary: 137 mg/dL — ABNORMAL HIGH (ref 65–99)
Glucose-Capillary: 143 mg/dL — ABNORMAL HIGH (ref 65–99)
Glucose-Capillary: 145 mg/dL — ABNORMAL HIGH (ref 65–99)
Glucose-Capillary: 146 mg/dL — ABNORMAL HIGH (ref 65–99)
Glucose-Capillary: 148 mg/dL — ABNORMAL HIGH (ref 65–99)
Glucose-Capillary: 158 mg/dL — ABNORMAL HIGH (ref 65–99)
Glucose-Capillary: 162 mg/dL — ABNORMAL HIGH (ref 65–99)
Glucose-Capillary: 180 mg/dL — ABNORMAL HIGH (ref 65–99)
Glucose-Capillary: 180 mg/dL — ABNORMAL HIGH (ref 65–99)
Glucose-Capillary: 181 mg/dL — ABNORMAL HIGH (ref 65–99)
Glucose-Capillary: 206 mg/dL — ABNORMAL HIGH (ref 65–99)

## 2017-08-06 LAB — COMPREHENSIVE METABOLIC PANEL
ALBUMIN: 2.2 g/dL — AB (ref 3.5–5.0)
ALT: 12 U/L — ABNORMAL LOW (ref 17–63)
ANION GAP: 9 (ref 5–15)
AST: 42 U/L — ABNORMAL HIGH (ref 15–41)
Alkaline Phosphatase: 112 U/L (ref 38–126)
BUN: 16 mg/dL (ref 6–20)
CO2: 18 mmol/L — AB (ref 22–32)
Calcium: 7.5 mg/dL — ABNORMAL LOW (ref 8.9–10.3)
Chloride: 119 mmol/L — ABNORMAL HIGH (ref 101–111)
Creatinine, Ser: 1.41 mg/dL — ABNORMAL HIGH (ref 0.61–1.24)
GFR calc non Af Amer: >60 mL/min (ref >60)
GLUCOSE: 160 mg/dL — AB (ref 65–99)
POTASSIUM: 4.1 mmol/L (ref 3.5–5.1)
SODIUM: 146 mmol/L — AB (ref 135–145)
TOTAL PROTEIN: 4.5 g/dL — AB (ref 6.5–8.1)
Total Bilirubin: 0.6 mg/dL (ref 0.3–1.2)

## 2017-08-06 LAB — MAGNESIUM
MAGNESIUM: 2 mg/dL (ref 1.7–2.4)
MAGNESIUM: 2.1 mg/dL (ref 1.7–2.4)
MAGNESIUM: 2.1 mg/dL (ref 1.7–2.4)
MAGNESIUM: 2.2 mg/dL (ref 1.7–2.4)
MAGNESIUM: 2.2 mg/dL (ref 1.7–2.4)
MAGNESIUM: 2.2 mg/dL (ref 1.7–2.4)
MAGNESIUM: 2.3 mg/dL (ref 1.7–2.4)
MAGNESIUM: 2.3 mg/dL (ref 1.7–2.4)

## 2017-08-06 LAB — URINALYSIS, ROUTINE W REFLEX MICROSCOPIC
BILIRUBIN URINE: NEGATIVE
Bacteria, UA: NONE SEEN
KETONES UR: NEGATIVE mg/dL
LEUKOCYTES UA: NEGATIVE
NITRITE: NEGATIVE
PH: 6 (ref 5.0–8.0)
Protein, ur: 100 mg/dL — AB
SQUAMOUS EPITHELIAL / LPF: NONE SEEN
Specific Gravity, Urine: 1.017 (ref 1.005–1.030)

## 2017-08-06 LAB — BLOOD GAS, ARTERIAL
ACID-BASE DEFICIT: 4.8 mmol/L — AB (ref 0.0–2.0)
Bicarbonate: 18.3 mmol/L — ABNORMAL LOW (ref 20.0–28.0)
DRAWN BY: 330991
FIO2: 30
MECHVT: 580 mL
O2 Saturation: 99.5 %
PATIENT TEMPERATURE: 100.2
PCO2 ART: 27 mmHg — AB (ref 32.0–48.0)
PEEP/CPAP: 5 cmH2O
PH ART: 7.45 (ref 7.350–7.450)
PO2 ART: 160 mmHg — AB (ref 83.0–108.0)
RATE: 20 resp/min

## 2017-08-06 LAB — PHOSPHORUS
PHOSPHORUS: 2.8 mg/dL (ref 2.5–4.6)
PHOSPHORUS: 2.9 mg/dL (ref 2.5–4.6)
PHOSPHORUS: 3 mg/dL (ref 2.5–4.6)
PHOSPHORUS: 3.1 mg/dL (ref 2.5–4.6)
Phosphorus: 3.5 mg/dL (ref 2.5–4.6)
Phosphorus: 3.6 mg/dL (ref 2.5–4.6)
Phosphorus: 3.6 mg/dL (ref 2.5–4.6)
Phosphorus: 3.3 mg/dL (ref 2.5–4.6)

## 2017-08-06 LAB — CBC WITH DIFFERENTIAL/PLATELET
BASOS ABS: 0 10*3/uL (ref 0.0–0.1)
Basophils Relative: 0 %
Eosinophils Absolute: 0 10*3/uL (ref 0.0–0.7)
Eosinophils Relative: 0 %
HEMATOCRIT: 26.7 % — AB (ref 39.0–52.0)
HEMOGLOBIN: 9.6 g/dL — AB (ref 13.0–17.0)
LYMPHS PCT: 7 %
Lymphs Abs: 1.1 10*3/uL (ref 0.7–4.0)
MCH: 27.9 pg (ref 26.0–34.0)
MCHC: 36 g/dL (ref 30.0–36.0)
MCV: 77.6 fL — AB (ref 78.0–100.0)
MONO ABS: 1.8 10*3/uL — AB (ref 0.1–1.0)
MONOS PCT: 11 %
NEUTROS ABS: 13.3 10*3/uL — AB (ref 1.7–7.7)
NEUTROS PCT: 82 %
Platelets: 279 10*3/uL (ref 150–400)
RBC: 3.44 MIL/uL — ABNORMAL LOW (ref 4.22–5.81)
RDW: 14.3 % (ref 11.5–15.5)
WBC: 16.2 10*3/uL — ABNORMAL HIGH (ref 4.0–10.5)

## 2017-08-06 LAB — CORTISOL: Cortisol, Plasma: 12.9 ug/dL

## 2017-08-06 MED ORDER — PRO-STAT SUGAR FREE PO LIQD
30.0000 mL | Freq: Every day | ORAL | Status: DC
  Filled 2017-08-06: qty 30

## 2017-08-06 MED ORDER — LACTULOSE 10 GM/15ML PO SOLN
20.0000 g | Freq: Two times a day (BID) | ORAL | Status: DC
  Administered 2017-08-06 – 2017-08-08 (×5): 20 g
  Filled 2017-08-06 (×6): qty 30

## 2017-08-06 MED ORDER — ACETAMINOPHEN 160 MG/5ML PO SOLN
650.0000 mg | ORAL | Status: DC | PRN
  Administered 2017-08-06: 650 mg
  Filled 2017-08-06: qty 20.3

## 2017-08-06 MED ORDER — PRO-STAT SUGAR FREE PO LIQD
30.0000 mL | Freq: Two times a day (BID) | ORAL | Status: DC
  Administered 2017-08-06: 30 mL
  Filled 2017-08-06: qty 30

## 2017-08-06 MED ORDER — VITAL 1.5 CAL PO LIQD
1000.0000 mL | ORAL | Status: DC
  Administered 2017-08-06: 1000 mL
  Filled 2017-08-06 (×3): qty 1000

## 2017-08-06 MED ORDER — VITAL HIGH PROTEIN PO LIQD: 1000.0000 mL | ORAL | Status: DC

## 2017-08-06 MED ORDER — NOREPINEPHRINE BITARTRATE 1 MG/ML IV SOLN
5.0000 ug/min | INTRAVENOUS | Status: DC
  Administered 2017-08-06: 4 ug/min via INTRAVENOUS
  Filled 2017-08-06 (×4): qty 16

## 2017-08-06 NOTE — Progress Notes (Addendum)
Inpatient Diabetes Program Recommendations  AACE/ADA: New Consensus Statement on Inpatient Glycemic Control (2015)  Target Ranges:  Prepandial:   less than 140 mg/dL      Peak postprandial:   less than 180 mg/dL (1-2 hours)      Critically ill patients:  140 - 180 mg/dL   Lab Results  Component Value Date   GLUCAP 111 (H) 08/06/2017   HGBA1C 9.5 (H) 07/30/2017    Review of Glycemic Control  Inpatient Diabetes Program Recommendations:   Patient last seen by DM coordinator Tama Headings on 07/31/17 and patient not participative in conversation regarding diabetes management. Will follow. Noted patient currently on IV insulin. When criteria met, will need basal insulin 2 hrs prior to IV insulin discontinued and Novolog correction coverage on CBG when IV insulin discontinued.  Thank you, Nani Gasser. Markiya Keefe, RN, MSN, CDE  Diabetes Coordinator Inpatient Glycemic Control Team Team Pager [phone] (8am-5pm) 08/06/2017 10:04 AM

## 2017-08-06 NOTE — Progress Notes (Signed)
Initial Nutrition Assessment  DOCUMENTATION CODES:   Not applicable  INTERVENTION:  Initiate TF via OGT with Vital 1.5 at goal rate of 55 ml/h (1320 ml per day) and Prostat 30 ml once daily to provide 2080 kcals, 104 gm protein, 1003 ml free water daily.  NUTRITION DIAGNOSIS:   Inadequate oral intake related to inability to eat as evidenced by NPO status.  GOAL:   Patient will meet greater than or equal to 90% of their needs  MONITOR:   Vent status, TF tolerance, Weight trends, Skin, I & O's, Labs  REASON FOR ASSESSMENT:   Ventilator, Consult Enteral/tube feeding initiation and management  ASSESSMENT:   31 yo M with Hx of type 1DM, multiple admissions for DKA presented because of unresponsiveness, had seizure like activity in ED. Received ativan and intubated for airway protection. Patient found to have severe anion gap acidosis due to DKA and lactic acidosis.   Patient is currently intubated on ventilator support MV: 10.6 L/min Temp (24hrs), Avg:99.8 F (37.7 C), Min:99.1 F (37.3 C), Max:100.6 F (38.1 C)  Propofol: none  Pt with no observed significant fat or muscle mass loss. Labs and medications reviewed.   Diet Order:  Diet NPO time specified  Skin:  Wound (see comment) (Diabetic ulcer L heel)  Last BM:  PTA  Height:   Ht Readings from Last 1 Encounters:  08/04/17 6\' 2"  (1.88 m)    Weight:   Wt Readings from Last 1 Encounters:  08/06/17 184 lb 4.9 oz (83.6 kg)  Admit weight 8/18: 164 lb (74.8 kg) Net I/O: +5 L since admit  Ideal Body Weight:  86.36 kg  BMI:  Body mass index is 23.66 kg/m.  Estimated Nutritional Needs:   Kcal:  2112  Protein:  100-115 grams  Fluid:  Per MD  EDUCATION NEEDS:   No education needs identified at this time  2113, MS, RD, LDN Pager # [phone] After hours/ weekend pager # [phone]

## 2017-08-06 NOTE — Progress Notes (Signed)
eLink Physician-Brief Progress Note Patient Name: ARYEH BUTTERFIELD DOB: March 07, 1986 MRN: [id number]   Date of Service  08/06/2017  HPI/Events of Note  intermittent hypotension with movemnts Ensure cortisol  Also fever 101 UA On ABX BC is spike further  eICU Interventions       Intervention Category Intermediate Interventions: Diagnostic test evaluation;Hypotension - evaluation and management  FEINSTEIN,DANIEL J. 08/06/2017, 5:00 PM

## 2017-08-06 NOTE — Progress Notes (Signed)
PULMONARY / CRITICAL CARE MEDICINE   Name: [name] MRN: [id number] DOB: 1986/06/28    ADMISSION DATE:  08/04/2017 CONSULTATION DATE:  8/18  REFERRING MD:  EDP  CHIEF COMPLAINT:  AMS   HISTORY OF PRESENT ILLNESS:   31 yo M with Hx of type 1DM, multiple admissions for DKA presented because of unresponsiveness, had seizure like activity in ED. Received ativan and intubated for airway protection. Patient found to have severe anion gap acidosis due to DKA and lactic acidosis.   SUBJECTIVE:  Easily agitated, remains on insulin gtt. Off pressors.   VITAL SIGNS: BP 107/88   Pulse 95   Temp 99.9 F (37.7 C)   Resp (!) 22   Ht 6\' 2"  (1.88 m)   Wt 83.6 kg (184 lb 4.9 oz)   SpO2 100%   BMI 23.66 kg/m   HEMODYNAMICS:    VENTILATOR SETTINGS: Vent Mode: PSV;CPAP FiO2 (%):  [30 %-40 %] 30 % Set Rate:  [20 bmp] 20 bmp Vt Set:  [580 mL] 580 mL PEEP:  [5 cmH20] 5 cmH20 Pressure Support:  [5 cmH20-10 cmH20] 5 cmH20 Plateau Pressure:  [13 cmH20-14 cmH20] 14 cmH20  INTAKE / OUTPUT: I/O last 3 completed shifts: In: 10066.7 [I.V.:8486.7; IV Piggyback:1580] Out: 8460 [Urine:8110; Emesis/NG output:350]  PHYSICAL EXAMINATION: General:  Chronically ill appearing male, agitated on vent Neuro:  RASS -2 initially, then +2 when stimulated, does not follow commands, agitated, flailing in bed  HEENT:  Mm moist, ETT, no JVD  Cardiovascular:  s1s2 rrr Lungs:  resps even non labored on PS 5/5 when resting, quickly tachypneic with RR 35-40 when agitated  Abdomen:  Round, soft, +bs  Musculoskeletal:  Warm and dry, no edema   LABS:  BMET  Recent Labs Lab 08/05/17 2000 08/06/17 0000 08/06/17 0329  NA 144 146* 146*  K 4.3 4.2 4.1  CL 116* 119* 119*  CO2 19* 20* 18*  BUN 16 17 16   CREATININE 1.47* 1.44* 1.41*  GLUCOSE 163* 153* 160*    Electrolytes  Recent Labs Lab 08/05/17 2000 08/06/17 0000 08/06/17 0329 08/06/17 0728  CALCIUM 8.1* 7.9* 7.5*  --   MG 2.3 2.1 2.3  2.2  PHOS <1.0* 3.1 3.5 3.6    CBC  Recent Labs Lab 08/04/17 1545  08/04/17 1912 08/05/17 0245 08/06/17 0329  WBC 51.0*  --   --  20.8* 16.2*  HGB 14.8  < > 12.2* 10.4* 9.6*  HCT 43.9  < > 36.0* 29.0* 26.7*  PLT 653*  --   --  344 279  < > = values in this interval not displayed.  Coag's  Recent Labs Lab 08/04/17 2110  APTT 33  INR 1.38    Sepsis Markers  Recent Labs Lab 07/30/17 1504 07/31/17 0300  08/04/17 2110 08/05/17 0422 08/05/17 0831  LATICACIDVEN 2.4*  --   < > 1.6 2.5* 2.8*  PROCALCITON 0.59 0.71  --  3.06  --   --   < > = values in this interval not displayed.  ABG  Recent Labs Lab 08/05/17 0243 08/05/17 0928 08/06/17 0345  PHART 7.235* 7.345* 7.450  PCO2ART 16.6* 35.1 27.0*  PO2ART 136.0* 187.0* 160*    Liver Enzymes  Recent Labs Lab 07/31/17 0300 08/04/17 1545 08/06/17 0329  AST 8* 17 42*  ALT 10* 14* 12*  ALKPHOS 98 263* 112  BILITOT 0.9 2.0* 0.6  ALBUMIN 3.2* 4.2 2.2*    Cardiac Enzymes  Recent Labs Lab 08/04/17 2110  TROPONINI <0.03  Glucose  Recent Labs Lab 08/06/17 0421 08/06/17 0518 08/06/17 0618 08/06/17 0727 08/06/17 0836 08/06/17 0950  GLUCAP 157* 152* 118* 180* 203* 111*    Imaging Dg Chest Port 1 View  Result Date: 08/06/2017 CLINICAL DATA:  Acute respiratory acidosis . EXAM: PORTABLE CHEST 1 VIEW COMPARISON:  08/04/2017 .  CT 06/10/2017 . FINDINGS: Endotracheal tube and NG tube in stable position. Heart size stable. Low lung volumes with mild basilar atelectasis. Small bilateral pleural effusions cannot be excluded. IMPRESSION: 1. Lines and tubes in stable position. 2. Low lung volumes with mild basilar atelectasis. Small bilateral pleural effusions cannot be excluded. Electronically Signed   By: Maisie Fus  Register   On: 08/06/2017 06:46     STUDIES:  CT head 8/18>>> No acute intracranial abnormality.  CULTURES: BC x 2 8/18>>> Sputum 8/18>>> rare GPC, rare GNC>>>  ANTIBIOTICS: vanc  8/19>>> Zosyn 8/19>>>  SIGNIFICANT EVENTS:   LINES/TUBES: ETT 8/18>>>  DISCUSSION: 31 yo M with Hx of type 1DM, multiple admissions for DKA presented because of unresponsiveness, had seizure like activity in ED. Received ativan and intubated for airway protection. Patient found to have severe anion gap acidosis due to DKA and lactic acidosis   ASSESSMENT / PLAN:  PULMONARY Acute hypoxic respiratory failure  P:   Vent support - 8cc/kg  F/u CXR  F/u ABG PS wean as tol - mental status barrier to extubation at this point   CARDIOVASCULAR ?Septic shock  Hypotension - improving slowly  P:  abx as above  Continue gentle volume  Trend lactate, pct  D/c aline - positional   RENAL DKA  Metabolic acidosis  hypoMg  Hypophos  Hyperchloremia  Hyperammonemia  P:   Continue gentle volume  F/u chem  Replete electrolytes  Continue insulin gtt - weaning off per protocol - gap closed  D5 1/2NS  Trend lactate  F/u ammonia  add lactulose - watch closely with relative volume depletion   GASTROINTESTINAL No active issue  P:   Start TF  PPI   HEMATOLOGIC No active issue  P:  F/u CBC  SQ heparin   INFECTIOUS ?sepsis  P:   Continue empiric vanc/zosyn for now  Pct marginal  Trend pct, lactate   ENDOCRINE DM  DKA    P:   Insulin gtt per protocol - weaning off    NEUROLOGIC AMS - multifactorial r/t DKA, hyperammonemia  Sedation needs on vent  P:   RASS goal: -1 Continue precedex  Daily WUA  Add lactulose    FAMILY  - Updates: no family available 8/20  - Inter-disciplinary family meet or Palliative Care meeting due by:  8/26   Dirk Dress, NP 08/06/2017  9:51 AM Pager: [phone] or [phone]  Attending Note:  31 year old male frequent admissions for DKA, in respiratory failure.  On exam, lungs are clear.  I reviewed CXR myself, ETT is in good position.  Patient remains on precedex.  Will continue for now.  Continue full vent support.   Will continue insulin drip until gap is closed.  Replace electrolytes as indicated.  Labs ordered for AM.  Will f/u.  The patient is critically ill with multiple organ systems failure and requires high complexity decision making for assessment and support, frequent evaluation and titration of therapies, application of advanced monitoring technologies and extensive interpretation of multiple databases.   Critical Care Time devoted to patient care services described in this note is  35  Minutes. This time reflects time of care of  this signee Dr Jennet Maduro. This critical care time does not reflect procedure time, or teaching time or supervisory time of PA/NP/Med student/Med Resident etc but could involve care discussion time.  Rush Farmer, M.D. [facility]monary/Critical Care Medicine. Pager: [phone]. After hours pager: [phone].

## 2017-08-07 ENCOUNTER — Ambulatory Visit (INDEPENDENT_AMBULATORY_CARE_PROVIDER_SITE_OTHER): Payer: Medicare Other | Admitting: Orthopaedic Surgery

## 2017-08-07 ENCOUNTER — Inpatient Hospital Stay (HOSPITAL_COMMUNITY): Payer: Medicare Other

## 2017-08-07 DIAGNOSIS — A419 Sepsis, unspecified organism: Principal | ICD-10-CM

## 2017-08-07 LAB — PHOSPHORUS
PHOSPHORUS: 2.8 mg/dL (ref 2.5–4.6)
Phosphorus: 2.7 mg/dL (ref 2.5–4.6)

## 2017-08-07 LAB — BLOOD GAS, ARTERIAL
Acid-base deficit: 7.5 mmol/L — ABNORMAL HIGH (ref 0.0–2.0)
BICARBONATE: 16.9 mmol/L — AB (ref 20.0–28.0)
Drawn by: 252031
FIO2: 30
LHR: 20 {breaths}/min
MECHVT: 580 mL
O2 SAT: 99 %
PATIENT TEMPERATURE: 98.6
PCO2 ART: 30.5 mmHg — AB (ref 32.0–48.0)
PEEP/CPAP: 5 cmH2O
PH ART: 7.362 (ref 7.350–7.450)
PO2 ART: 160 mmHg — AB (ref 83.0–108.0)

## 2017-08-07 LAB — CBC
HEMATOCRIT: 24.7 % — AB (ref 39.0–52.0)
HEMOGLOBIN: 8.7 g/dL — AB (ref 13.0–17.0)
MCH: 28.5 pg (ref 26.0–34.0)
MCHC: 35.2 g/dL (ref 30.0–36.0)
MCV: 81 fL (ref 78.0–100.0)
Platelets: 202 10*3/uL (ref 150–400)
RBC: 3.05 MIL/uL — ABNORMAL LOW (ref 4.22–5.81)
RDW: 15.4 % (ref 11.5–15.5)
WBC: 10.1 10*3/uL (ref 4.0–10.5)

## 2017-08-07 LAB — BASIC METABOLIC PANEL
Anion gap: 5 (ref 5–15)
BUN: 18 mg/dL (ref 6–20)
CHLORIDE: 124 mmol/L — AB (ref 101–111)
CO2: 18 mmol/L — AB (ref 22–32)
CREATININE: 1.19 mg/dL (ref 0.61–1.24)
Calcium: 7.8 mg/dL — ABNORMAL LOW (ref 8.9–10.3)
GFR calc non Af Amer: >60 mL/min (ref >60)
Glucose, Bld: 147 mg/dL — ABNORMAL HIGH (ref 65–99)
POTASSIUM: 3.4 mmol/L — AB (ref 3.5–5.1)
Sodium: 147 mmol/L — ABNORMAL HIGH (ref 135–145)

## 2017-08-07 LAB — CULTURE, RESPIRATORY W GRAM STAIN: Culture: NORMAL

## 2017-08-07 LAB — URINE DRUGS OF ABUSE SCREEN W ALC, ROUTINE (REF LAB)
Amphetamines, Urine: NEGATIVE ng/mL
BENZODIAZEPINE QUANT UR: NEGATIVE ng/mL
Barbiturate, Ur: NEGATIVE ng/mL
CANNABINOID QUANT UR: NEGATIVE ng/mL
COCAINE (METAB.): NEGATIVE ng/mL
Ethanol U, Quan: NEGATIVE %
Methadone Screen, Urine: NEGATIVE ng/mL
OPIATE QUANT UR: NEGATIVE ng/mL
PHENCYCLIDINE, UR: NEGATIVE ng/mL
Propoxyphene, Urine: NEGATIVE ng/mL

## 2017-08-07 LAB — GLUCOSE, CAPILLARY
GLUCOSE-CAPILLARY: 104 mg/dL — AB (ref 65–99)
GLUCOSE-CAPILLARY: 133 mg/dL — AB (ref 65–99)
GLUCOSE-CAPILLARY: 148 mg/dL — AB (ref 65–99)
GLUCOSE-CAPILLARY: 159 mg/dL — AB (ref 65–99)
GLUCOSE-CAPILLARY: 200 mg/dL — AB (ref 65–99)
GLUCOSE-CAPILLARY: 241 mg/dL — AB (ref 65–99)
GLUCOSE-CAPILLARY: 40 mg/dL — AB (ref 65–99)
GLUCOSE-CAPILLARY: 42 mg/dL — AB (ref 65–99)
Glucose-Capillary: 106 mg/dL — ABNORMAL HIGH (ref 65–99)
Glucose-Capillary: 114 mg/dL — ABNORMAL HIGH (ref 65–99)
Glucose-Capillary: 118 mg/dL — ABNORMAL HIGH (ref 65–99)
Glucose-Capillary: 140 mg/dL — ABNORMAL HIGH (ref 65–99)
Glucose-Capillary: 165 mg/dL — ABNORMAL HIGH (ref 65–99)
Glucose-Capillary: 185 mg/dL — ABNORMAL HIGH (ref 65–99)
Glucose-Capillary: 204 mg/dL — ABNORMAL HIGH (ref 65–99)
Glucose-Capillary: 228 mg/dL — ABNORMAL HIGH (ref 65–99)
Glucose-Capillary: 74 mg/dL (ref 65–99)
Glucose-Capillary: 78 mg/dL (ref 65–99)

## 2017-08-07 LAB — MAGNESIUM
Magnesium: 2.1 mg/dL (ref 1.7–2.4)
Magnesium: 1.9 mg/dL (ref 1.7–2.4)

## 2017-08-07 LAB — AMMONIA: AMMONIA: 43 umol/L — AB (ref 9–35)

## 2017-08-07 LAB — CULTURE, RESPIRATORY

## 2017-08-07 MED ORDER — DEXTROSE 50 % IV SOLN
INTRAVENOUS | Status: AC
  Administered 2017-08-07: 21:00:00
  Filled 2017-08-07: qty 50

## 2017-08-07 MED ORDER — MIDAZOLAM HCL 2 MG/2ML IJ SOLN
0.5000 mg | INTRAMUSCULAR | Status: DC | PRN
  Administered 2017-08-07: 0.5 mg via INTRAVENOUS
  Filled 2017-08-07: qty 2

## 2017-08-07 MED ORDER — FENTANYL CITRATE (PF) 100 MCG/2ML IJ SOLN
25.0000 ug | INTRAMUSCULAR | Status: DC | PRN
  Administered 2017-08-07 – 2017-08-08 (×6): 50 ug via INTRAVENOUS
  Filled 2017-08-07 (×6): qty 2

## 2017-08-07 MED ORDER — HALOPERIDOL LACTATE 5 MG/ML IJ SOLN: 5.0000 mg | Freq: Once | INTRAMUSCULAR | Status: AC

## 2017-08-07 MED ORDER — SODIUM CHLORIDE 0.9% FLUSH
10.0000 mL | Freq: Two times a day (BID) | INTRAVENOUS | Status: DC
  Administered 2017-08-07: 10 mL
  Administered 2017-08-07: 40 mL
  Administered 2017-08-08: 20 mL

## 2017-08-07 MED ORDER — AMOXICILLIN-POT CLAVULANATE 875-125 MG PO TABS: 1.0000 | ORAL_TABLET | Freq: Two times a day (BID) | ORAL | Status: DC

## 2017-08-07 MED ORDER — DOXEPIN HCL 10 MG PO CAPS
10.0000 mg | ORAL_CAPSULE | Freq: Every day | ORAL | Status: DC
  Administered 2017-08-07 – 2017-08-08 (×2): 10 mg via ORAL
  Filled 2017-08-07 (×2): qty 1

## 2017-08-07 MED ORDER — INSULIN ASPART 100 UNIT/ML ~~LOC~~ SOLN
5.0000 [IU] | Freq: Three times a day (TID) | SUBCUTANEOUS | Status: DC
  Administered 2017-08-08: 5 [IU] via SUBCUTANEOUS

## 2017-08-07 MED ORDER — SODIUM CHLORIDE 0.9% FLUSH: 10.0000 mL | INTRAVENOUS | Status: DC | PRN

## 2017-08-07 MED ORDER — INSULIN ASPART 100 UNIT/ML ~~LOC~~ SOLN: 0.0000 [IU] | Freq: Three times a day (TID) | SUBCUTANEOUS | Status: DC

## 2017-08-07 MED ORDER — FENTANYL CITRATE (PF) 100 MCG/2ML IJ SOLN
100.0000 ug | INTRAMUSCULAR | Status: DC | PRN
  Administered 2017-08-07 (×3): 100 ug via INTRAVENOUS
  Filled 2017-08-07 (×4): qty 2

## 2017-08-07 MED ORDER — CHLORHEXIDINE GLUCONATE CLOTH 2 % EX PADS
6.0000 | MEDICATED_PAD | Freq: Every day | CUTANEOUS | Status: DC
  Administered 2017-08-07 – 2017-08-08 (×2): 6 via TOPICAL

## 2017-08-07 MED ORDER — DOXEPIN HCL 10 MG PO CAPS: 10.0000 mg | ORAL_CAPSULE | Freq: Every day | ORAL | Status: DC

## 2017-08-07 MED ORDER — HALOPERIDOL LACTATE 5 MG/ML IJ SOLN
INTRAMUSCULAR | Status: AC
  Administered 2017-08-07: 5 mg
  Filled 2017-08-07: qty 1

## 2017-08-07 MED ORDER — INSULIN DETEMIR 100 UNIT/ML ~~LOC~~ SOLN
30.0000 [IU] | Freq: Every day | SUBCUTANEOUS | Status: DC
  Administered 2017-08-07: 30 [IU] via SUBCUTANEOUS
  Filled 2017-08-07: qty 0.3

## 2017-08-07 MED ORDER — INSULIN DETEMIR 100 UNIT/ML ~~LOC~~ SOLN
10.0000 [IU] | Freq: Once | SUBCUTANEOUS | Status: AC
  Administered 2017-08-07: 10 [IU] via SUBCUTANEOUS
  Filled 2017-08-07: qty 0.1

## 2017-08-07 MED ORDER — AMOXICILLIN-POT CLAVULANATE 875-125 MG PO TABS
1.0000 | ORAL_TABLET | Freq: Two times a day (BID) | ORAL | Status: DC
  Administered 2017-08-07 (×2): 1 via ORAL
  Filled 2017-08-07 (×4): qty 1

## 2017-08-07 MED ORDER — INSULIN ASPART 100 UNIT/ML ~~LOC~~ SOLN: 0.0000 [IU] | Freq: Every day | SUBCUTANEOUS | Status: DC

## 2017-08-07 MED ORDER — INSULIN DETEMIR 100 UNIT/ML ~~LOC~~ SOLN
40.0000 [IU] | Freq: Every day | SUBCUTANEOUS | Status: DC
  Filled 2017-08-07: qty 0.4

## 2017-08-07 MED ORDER — ORAL CARE MOUTH RINSE: 15.0000 mL | Freq: Two times a day (BID) | OROMUCOSAL | Status: DC

## 2017-08-07 NOTE — Progress Notes (Signed)
Pt. Has been placed on room air by nursing.

## 2017-08-07 NOTE — Progress Notes (Signed)
eLink Physician-Brief Progress Note Patient Name: [name] DOB: 07-16-1986 MRN: [id number]   Date of Service  08/07/2017  HPI/Events of Note  Anxiety  versed  eICU Interventions       Intervention Category Minor Interventions: Agitation / anxiety - evaluation and management  Nelda Bucks. 08/07/2017, 7:33 PM

## 2017-08-07 NOTE — Progress Notes (Signed)
Inpatient Diabetes Program Recommendations  AACE/ADA: New Consensus Statement on Inpatient Glycemic Control (2015)  Target Ranges:  Prepandial:   less than 140 mg/dL      Peak postprandial:   less than 180 mg/dL (1-2 hours)      Critically ill patients:  140 - 180 mg/dL   Lab Results  Component Value Date   GLUCAP 114 (H) 08/07/2017   HGBA1C 9.5 (H) 07/30/2017    Review of Glycemic Control  Diabetes history: DM1 Outpatient Diabetes medications: Lantus 45 units hs + Humalog 10 units tid meal coverage  Current orders for Inpatient glycemic control: IV insulin drip  Inpatient Diabetes Program Recommendations:    When transitioning from IV insulin drip:  -Levemir 40 units daily (give 2 hrs prior to IV drip D/C'd) -Novolog 5 units tid meal coverage when eating 50% -Novolog correction 0-9 units tid + 0-5 units hs (Give first dose correction @ time of insulin drip D/C'd)  Thank you, [name]. Christopher Dilday, RN, MSN, CDE  Diabetes Coordinator Inpatient Glycemic Control Team Team Pager [phone] (8am-5pm) 08/07/2017 9:20 AM]

## 2017-08-07 NOTE — Progress Notes (Signed)
Hypoglycemic Event  CBG: 40  Treatment: 15 GM carbohydrate snack  Symptoms: Pale  Follow-up CBG: Time:2015 CBG Result:42  Pt then given 1 amp D50; repeat CBG 104  Possible Reasons for Event: Inadequate meal intake  Comments/MD notified:    Veto Kemps

## 2017-08-07 NOTE — Progress Notes (Signed)
eLink Physician-Brief Progress Note Patient Name: DRAEDYN WEIDINGER DOB: August 11, 1986 MRN: [id number]   Date of Service  08/07/2017  HPI/Events of Note  Agitation - patient sitting up in bed.   eICU Interventions  Will order: 1. Increase Fentanyl dose to 100 mcg IV Q 1 hour PRN.      Intervention Category Minor Interventions: Agitation / anxiety - evaluation and management  Sommer,Steven Eugene 08/07/2017, 4:21 AM

## 2017-08-07 NOTE — Procedures (Signed)
Extubation Procedure Note  Patient Details:   Name: [name] DOB: [birth year] MRN: [id number]   Airway Documentation:     Evaluation  O2 sats: stable throughout Complications: No apparent complications Patient did tolerate procedure well. Bilateral Breath Sounds: Diminished, Clear   Yes   Pt. Extubated placed on 3 lpm [place] per MD order.  Pt. Verbalizing and following commands upon request.  No signs of distress or complications at this time.  Kerrin Champagne 08/07/2017, 9:36 AM

## 2017-08-07 NOTE — Progress Notes (Signed)
PULMONARY / CRITICAL CARE MEDICINE   Name: [name] MRN: [id number] DOB: 29-Jan-1986    ADMISSION DATE:  08/04/2017 CONSULTATION DATE:  8/18  REFERRING MD:  EDP  CHIEF COMPLAINT:  AMS   HISTORY OF PRESENT ILLNESS:   31 yo M with Hx of type 1DM, multiple admissions for DKA presented because of unresponsiveness, had seizure like activity in ED. Received ativan and intubated for airway protection. Patient found to have severe anion gap acidosis due to DKA and lactic acidosis.   SUBJECTIVE:  Very agitated this AM  VITAL SIGNS: BP (!) 141/110   Pulse 90   Temp 99.7 F (37.6 C)   Resp 20   Ht 6\' 2"  (1.88 m)   Wt 86.7 kg (191 lb 2.2 oz)   SpO2 100%   BMI 24.54 kg/m   HEMODYNAMICS:    VENTILATOR SETTINGS: Vent Mode: PRVC FiO2 (%):  [30 %] 30 % Set Rate:  [20 bmp] 20 bmp Vt Set:  [580 mL] 580 mL PEEP:  [5 cmH20] 5 cmH20 Pressure Support:  [5 cmH20] 5 cmH20 Plateau Pressure:  [12 cmH20-19 cmH20] 14 cmH20  INTAKE / OUTPUT: I/O last 3 completed shifts: In: 7739.8 [I.V.:5845.4; NG/GT:974.4; IV Piggyback:920] Out: 2410 [Urine:2310; Emesis/NG output:100]  PHYSICAL EXAMINATION: General:  Chronically ill appearing male, agitated on vent Neuro:  Agitated, moving all ext spontaneously HEENT:  NS/AT, PERRL, EOM-I and MMM Cardiovascular:  Regular, tachy, Nl S1/S2, -M/R/G. Lungs:  CTA bilaterally Abdomen:  Soft, NT, ND and +BS Musculoskeletal:  Warm and dry, no edema   LABS:  BMET  Recent Labs Lab 08/06/17 1920 08/06/17 2311 08/07/17 0431  NA 143 145 147*  K 3.4* 3.5 3.4*  CL 118* 120* 124*  CO2 15* 17* 18*  BUN 19 19 18   CREATININE 1.39* 1.26* 1.19  GLUCOSE 140* 251* 147*   Electrolytes  Recent Labs Lab 08/06/17 1920 08/06/17 1929 08/06/17 2311 08/07/17 0431  CALCIUM 7.5*  --  7.7* 7.8*  MG  --  2.1 2.0 2.1  PHOS  --  2.8 2.9 2.7   CBC  Recent Labs Lab 08/05/17 0245 08/06/17 0329 08/07/17 0431  WBC 20.8* 16.2* 10.1  HGB 10.4* 9.6* 8.7*   HCT 29.0* 26.7* 24.7*  PLT 344 279 202    Coag's  Recent Labs Lab 08/04/17 2110  APTT 33  INR 1.38   Sepsis Markers  Recent Labs Lab 08/04/17 2110 08/05/17 0422 08/05/17 0831  LATICACIDVEN 1.6 2.5* 2.8*  PROCALCITON 3.06  --   --    ABG  Recent Labs Lab 08/05/17 0928 08/06/17 0345 08/07/17 0310  PHART 7.345* 7.450 7.362  PCO2ART 35.1 27.0* 30.5*  PO2ART 187.0* 160* 160*   Liver Enzymes  Recent Labs Lab 08/04/17 1545 08/06/17 0329  AST 17 42*  ALT 14* 12*  ALKPHOS 263* 112  BILITOT 2.0* 0.6  ALBUMIN 4.2 2.2*   Cardiac Enzymes  Recent Labs Lab 08/04/17 2110  TROPONINI <0.03   Glucose  Recent Labs Lab 08/07/17 0317 08/07/17 0423 08/07/17 0540 08/07/17 0646 08/07/17 0758 08/07/17 0855  GLUCAP 165* 148* 159* 185* 118* 114*   Imaging Dg Chest Port 1 View  Result Date: 08/07/2017 CLINICAL DATA:  Intubation. EXAM: PORTABLE CHEST 1 VIEW COMPARISON:  08/06/2017. FINDINGS: Endotracheal tube and NG tube in stable position. Heart size normal. Low lung volumes with bibasilar atelectasis. Tiny right-sided pleural effusion cannot be excluded on today's exam. No left pleural effusion. No pneumothorax. IMPRESSION: 1.  Lines and tubes in stable position. 2.  Low lung volumes with mild basilar atelectasis again noted without interim change. Small right pleural effusion cannot be excluded. No left pleural effusion noted on today's exam. Electronically Signed   By: Maisie Fus  Register   On: 08/07/2017 06:40   STUDIES:  CT head 8/18>>> No acute intracranial abnormality.  CULTURES: BC x 2 8/18>>>NTD Sputum 8/18>>> rare GPC, rare GNC>>>  ANTIBIOTICS: Vanc 8/19>>>8/21 Zosyn 8/19>>>8/21 Augmentin PO>>>  SIGNIFICANT EVENTS: 8/21 extubated  LINES/TUBES: ETT 8/18>>>8/21  DISCUSSION: 31 yo M with Hx of type 1DM, multiple admissions for DKA presented because of unresponsiveness, had seizure like activity in ED. Received ativan and intubated for airway protection.  Patient found to have severe anion gap acidosis due to DKA and lactic acidosis  ASSESSMENT / PLAN:  PULMONARY Acute hypoxic respiratory failure  P:   Extubate D/C further ABGs and CXR for now Titrate O2 for sat of 88-92% IS Ambulate  CARDIOVASCULAR Septic shock resolved Hypotension - improving slowly  P:  Abx as above  KVO IVF D/C norepi D/c aline  RENAL DKA  Metabolic acidosis  hypoMg  Hypophos  Hyperchloremia  Hyperammonemia  P:   KVO IVF F/u chem  Replete electrolytes  D/C insulin GTT 1 hour after levemir D/C D5 1/2NS  F/u ammonia down to 43, will continue lactulose  GASTROINTESTINAL No active issue  P:   D/C TF SLP PPI   HEMATOLOGIC No active issue  P:  F/u CBC  SQ heparin   INFECTIOUS ?sepsis  P:   D/C vanc/zosyn Start augmentin with stop date 8/25  ENDOCRINE DM  DKA    P:   Insulin gtt per protocol - weaning off  Add 30 units levemir  Diabetic coordinator consult  NEUROLOGIC AMS - multifactorial r/t DKA, hyperammonemia  Sedation needs on vent  P:   D/C precedex Doxipen 10 mg daily at home dose PRN fentanyl for pain Daily WUA  Continue lactulose   FAMILY  - Updates: No family bedside to update  - Inter-disciplinary family meet or Palliative Care meeting due by:  8/26  The patient is critically ill with multiple organ systems failure and requires high complexity decision making for assessment and support, frequent evaluation and titration of therapies, application of advanced monitoring technologies and extensive interpretation of multiple databases.   Critical Care Time devoted to patient care services described in this note is  35  Minutes. This time reflects time of care of this signee Dr Koren Bound. This critical care time does not reflect procedure time, or teaching time or supervisory time of PA/NP/Med student/Med Resident etc but could involve care discussion time.  Alyson Reedy, M.D. [facility] Pulmonary/Critical Care  Medicine. Pager: [phone]. After hours pager: [phone].

## 2017-08-08 DIAGNOSIS — E0811 Diabetes mellitus due to underlying condition with ketoacidosis with coma: Secondary | ICD-10-CM

## 2017-08-08 LAB — BASIC METABOLIC PANEL
ANION GAP: 6 (ref 5–15)
BUN: 12 mg/dL (ref 6–20)
CO2: 22 mmol/L (ref 22–32)
Calcium: 7.8 mg/dL — ABNORMAL LOW (ref 8.9–10.3)
Chloride: 113 mmol/L — ABNORMAL HIGH (ref 101–111)
Creatinine, Ser: 1.07 mg/dL (ref 0.61–1.24)
GLUCOSE: 129 mg/dL — AB (ref 65–99)
POTASSIUM: 3.6 mmol/L (ref 3.5–5.1)
Sodium: 141 mmol/L (ref 135–145)

## 2017-08-08 LAB — CBC
HEMATOCRIT: 24.4 % — AB (ref 39.0–52.0)
HEMOGLOBIN: 8.4 g/dL — AB (ref 13.0–17.0)
MCH: 28.8 pg (ref 26.0–34.0)
MCHC: 34.4 g/dL (ref 30.0–36.0)
MCV: 83.6 fL (ref 78.0–100.0)
Platelets: 184 10*3/uL (ref 150–400)
RBC: 2.92 MIL/uL — AB (ref 4.22–5.81)
RDW: 15.3 % (ref 11.5–15.5)
WBC: 9.1 10*3/uL (ref 4.0–10.5)

## 2017-08-08 LAB — PHOSPHORUS: PHOSPHORUS: 2 mg/dL — AB (ref 2.5–4.6)

## 2017-08-08 LAB — GLUCOSE, CAPILLARY
GLUCOSE-CAPILLARY: 140 mg/dL — AB (ref 65–99)
GLUCOSE-CAPILLARY: 106 mg/dL — AB (ref 65–99)
Glucose-Capillary: 59 mg/dL — ABNORMAL LOW (ref 65–99)
Glucose-Capillary: 106 mg/dL — ABNORMAL HIGH (ref 65–99)

## 2017-08-08 LAB — MAGNESIUM: MAGNESIUM: 1.8 mg/dL (ref 1.7–2.4)

## 2017-08-08 MED ORDER — INSULIN DETEMIR 100 UNIT/ML ~~LOC~~ SOLN
25.0000 [IU] | Freq: Every day | SUBCUTANEOUS | Status: DC
  Administered 2017-08-08: 25 [IU] via SUBCUTANEOUS
  Filled 2017-08-08: qty 0.25

## 2017-08-08 MED ORDER — K PHOS MONO-SOD PHOS DI & MONO 155-852-130 MG PO TABS
500.0000 mg | ORAL_TABLET | Freq: Once | ORAL | Status: DC
  Filled 2017-08-08: qty 2

## 2017-08-08 MED ORDER — FAMOTIDINE 20 MG PO TABS
20.0000 mg | ORAL_TABLET | Freq: Two times a day (BID) | ORAL | Status: DC
  Administered 2017-08-08: 20 mg via ORAL
  Filled 2017-08-08 (×2): qty 1

## 2017-08-08 MED ORDER — COLLAGENASE 250 UNIT/GM EX OINT: TOPICAL_OINTMENT | CUTANEOUS | 0 refills | Status: DC

## 2017-08-08 MED ORDER — INSULIN ASPART 100 UNIT/ML ~~LOC~~ SOLN
5.0000 [IU] | Freq: Three times a day (TID) | SUBCUTANEOUS | 11 refills | Status: DC
Start: 2017-08-08 — End: 2017-08-18

## 2017-08-08 MED ORDER — ONDANSETRON HCL 4 MG/2ML IJ SOLN
4.0000 mg | Freq: Four times a day (QID) | INTRAMUSCULAR | Status: DC | PRN
  Administered 2017-08-08: 4 mg via INTRAVENOUS
  Filled 2017-08-08: qty 2

## 2017-08-08 MED ORDER — INSULIN ASPART 100 UNIT/ML FLEXPEN: PEN_INJECTOR | SUBCUTANEOUS | 11 refills | Status: DC

## 2017-08-08 MED ORDER — COLLAGENASE 250 UNIT/GM EX OINT
TOPICAL_OINTMENT | Freq: Every day | CUTANEOUS | Status: DC
  Administered 2017-08-08: 10:00:00 via TOPICAL
  Filled 2017-08-08: qty 30

## 2017-08-08 MED ORDER — AMOXICILLIN-POT CLAVULANATE 875-125 MG PO TABS: 1.0000 | ORAL_TABLET | Freq: Two times a day (BID) | ORAL | 0 refills | Status: AC

## 2017-08-08 MED ORDER — INSULIN DETEMIR 100 UNIT/ML ~~LOC~~ SOLN: 30.0000 [IU] | Freq: Every day | SUBCUTANEOUS | 11 refills | Status: DC

## 2017-08-08 MED ORDER — DEXTROSE 50 % IV SOLN
INTRAVENOUS | Status: AC
  Administered 2017-08-08: 50 mL
  Filled 2017-08-08: qty 50

## 2017-08-08 NOTE — Progress Notes (Signed)
eLink Physician-Brief Progress Note Patient Name: [name] DOB: 03-16-1986 MRN: [id number]   Date of Service  08/08/2017  HPI/Events of Note  Nausea - QTc interval = 370 milliseconds.   eICU Interventions  Will order Zofran 4 mg IV Q 6 hours PRN nausea/vomiting.      Intervention Category Intermediate Interventions: Other:  [name],[name] 08/08/2017, 12:04 AM

## 2017-08-08 NOTE — Discharge Summary (Signed)
Physician Discharge Summary  Patient ID: [name] MRN: [id number] DOB/AGE: 05/24/1986 30 y.o.  Admit date: 08/04/2017 Discharge date: 08/08/2017    Discharge Diagnoses:  Diabetic Ketoacidosis  Anion Gap Metabolic Acidosis Hypokalemia Hypomagnesemia Hypophosphatemia  Hyperammonemia  Acute Hypoxic Respiratory Failure  Hypovolemic Shock Acute Metabolic Encephalopathy                                                                      DISCHARGE PLAN BY DIAGNOSIS      Diabetic Ketoacidosis  Anion Gap Metabolic Acidosis  Discharge Plan:   Hypokalemia Hypomagnesemia Hypophosphatemia  Hyperammonemia   Discharge Plan: No acute follow up at time of discharge.   Carbohydrate diet recommended at discharge   Acute Hypoxic Respiratory Failure   Discharge Plan: No acute follow up at this time  Hypovolemic Shock  Discharge Plan: Resolved, no acute follow up at this time  Acute Metabolic Encephalopathy  Discharge Plan: Resolved, no acute follow up at this time                     DISCHARGE SUMMARY    30 y/o M with PMH of polysubstance abuse, depression, schizophrenia, GERD, chronic pain and DM with medical non-compliance who presented to [facility] on 08/04/17 altered mental status.  He was found to have severe anion gap acidosis in the setting of DKA.  The patient had questionable seizure activity & was medicated with ativan.  He subsequently required intubation in the ER.  He was admitted to the ICU for further medical management.  The patient was started on the DKA protocol with IV insulin and IV fluids.  CT of the head assessed and negative for acute intracranial abnormality.  He was empirically treated for infection with IV antibiotics.  Cultures to date are negative.  Antibiotics were narrowed.  The patient was subsequently weaned and extubated 8/21. He was transitioned off the insulin gtt to sliding scale insulin.  He was seen and evaluated by the  diabetes coordinator.  The patient was recommended to continue levemir 30 units QD, QID CBG with SSI and meal coverage 5 units TID.               STUDIES:  CT head 8/18 >> No acute intracranial abnormality.  CULTURES: BC x 2 8/18 >> Sputum 8/18 >> normal flora   ANTIBIOTICS: Vanc 8/19 >> 8/21 Zosyn 8/19 >> 8/21 Augmentin PO >> 8/25 (planned)  SIGNIFICANT EVENTS: 8/18  Admit  8/21 Extubated  LINES/TUBES: ETT 8/18 >> 8/21    Discharge Exam: General: chronically ill appearing male in NAD, lying in bed HEENT: MM pink/moist, no jvd, very poor dentition, multiple cracked teeth  PSY: calm / appropriate Neuro: AAOx4, speech clear, MAE CV: s1s2 rrr, no m/r/g PULM: even/non-labored, lungs bilaterally clear  GI:soft, non-tender, bsx4 active  Extremities: warm/dry, no edema  Skin: no rashes or lesions   Vitals:   08/08/17 1100 08/08/17 1130 08/08/17 1141 08/08/17 1200  BP: 120/76 99/77  110/74  Pulse: (!) 113 (!) 113  (!) 112  Resp: 20 (!) 25  (!) 22  Temp:   98 F (36.7 C)   TempSrc:   Oral   SpO2: 99% 99%  97%  Weight:        Height:         Discharge Labs  BMET  Recent Labs Lab 08/06/17 1421  08/06/17 1920 08/06/17 1929 08/06/17 2311 08/07/17 0431 08/07/17 0850 08/08/17 0648  NA 146*  --  143  --  145 147*  --  141  K 4.0  --  3.4*  --  3.5 3.4*  --  3.6  CL 122*  --  118*  --  120* 124*  --  113*  CO2 17*  --  15*  --  17* 18*  --  22  GLUCOSE 124*  --  140*  --  251* 147*  --  129*  BUN 19  --  19  --  19 18  --  12  CREATININE 1.34*  --  1.39*  --  1.26* 1.19  --  1.07  CALCIUM 7.6*  --  7.5*  --  7.7* 7.8*  --  7.8*  MG 2.2  < >  --  2.1 2.0 2.1 1.9 1.8  PHOS 3.3  < >  --  2.8 2.9 2.7 2.8 2.0*  < > = values in this interval not displayed.  CBC  Recent Labs Lab 08/06/17 0329 08/07/17 0431 08/08/17 0648  HGB 9.6* 8.7* 8.4*  HCT 26.7* 24.7* 24.4*  WBC 16.2* 10.1 9.1  PLT 279 202 184    Anti-Coagulation  Recent Labs Lab  08/04/17 2110  INR 1.38   Follow-up Information    Avbuere, Edwin, MD. Schedule an appointment as soon as possible for a visit in 1 week(s).   Specialty:  Internal Medicine Contact information: [address] [place] [address] [phone]           Allergies as of 08/08/2017      Reactions   Sulfa Antibiotics Other (See Comments)   Unknown childhood allergy      Medication List    STOP taking these medications   cephALEXin 500 MG capsule Commonly known as:  KEFLEX     TAKE these medications   accu-chek multiclix lancets Use as directed   amoxicillin-clavulanate 875-125 MG tablet Commonly known as:  AUGMENTIN Take 1 tablet by mouth every 12 (twelve) hours.   amphetamine-dextroamphetamine 30 MG tablet Commonly known as:  ADDERALL Take 1 tablet by mouth daily.   blood glucose meter kit and supplies Kit Dispense based on patient and insurance preference. Use up to four times daily as directed. (FOR ICD-9 250.00, 250.01).   collagenase ointment Commonly known as:  SANTYL Apply Santyl to left heel wound Q day, then cover with moist gauze and 4X4 and kerlex   doxepin 10 MG/ML solution Commonly known as:  SINEQUAN Take 10 mLs by mouth at bedtime.   glucose blood test strip Use as instructed   insulin aspart 100 UNIT/ML FlexPen Commonly known as:  NOVOLOG FLEXPEN Check Blood Sugar 4 times per day > with meals and at bedtime CBG 70 - 120: 0 units CBG 121 - 150: 2 units CBG 151 - 200: 3 units CBG 201 - 250: 5 units CBG 251 - 300: 8 units CBG 301 - 350: 11 units CBG 351 - 400: 15 units What changed:  additional instructions   insulin aspart 100 UNIT/ML injection Commonly known as:  novoLOG Inject 5 Units into the skin 3 (three) times daily with meals. What changed:  You were already taking a medication with the same name, and this prescription was added. Make sure you understand how and when to take each.   insulin   detemir 100 UNIT/ML injection Commonly  known as:  LEVEMIR Inject 0.3 mLs (30 Units total) into the skin daily. What changed:  how much to take  when to take this   metoCLOPramide 10 MG tablet Commonly known as:  REGLAN Take 1 tablet (10 mg total) by mouth 3 (three) times daily with meals.   ondansetron 4 MG disintegrating tablet Commonly known as:  ZOFRAN ODT Take 1 tablet (4 mg total) by mouth every 8 (eight) hours as needed for nausea or vomiting.   pantoprazole 40 MG tablet Commonly known as:  PROTONIX Take 1 tablet (40 mg total) by mouth daily.   Pen Needles 31G X 5 MM Misc 90 each by Does not apply route 4 (four) times daily -  before meals and at bedtime.   traMADol 50 MG tablet Commonly known as:  ULTRAM Take 1-2 tablets (50-100 mg total) by mouth every 6 (six) hours as needed for moderate pain or severe pain.   traZODone 150 MG tablet Commonly known as:  DESYREL Take 150 mg by mouth at bedtime.            Discharge Care Instructions        Start     Ordered   08/08/17 0000  amoxicillin-clavulanate (AUGMENTIN) 875-125 MG tablet  Every 12 hours     08/08/17 1556   08/08/17 0000  insulin detemir (LEVEMIR) 100 UNIT/ML injection  Daily     08/08/17 1556   08/08/17 0000  insulin aspart (NOVOLOG FLEXPEN) 100 UNIT/ML FlexPen     08/08/17 1556   08/08/17 0000  collagenase (SANTYL) ointment     08/08/17 1556   08/08/17 0000  Increase activity slowly     08/08/17 1556   08/08/17 0000  Diet Carb Modified     08/08/17 1556   08/08/17 0000  Call MD for:  temperature >100.4     08/08/17 1556   08/08/17 0000  Call MD for:  persistant nausea and vomiting     08/08/17 1556   08/08/17 0000  Call MD for:  severe uncontrolled pain     08/08/17 1556   08/08/17 0000  Call MD for:  redness, tenderness, or signs of infection (pain, swelling, redness, odor or green/yellow discharge around incision site)     08/08/17 1556   08/08/17 0000  Call MD for:  difficulty breathing, headache or visual disturbances      08/08/17 1556   08/08/17 0000  Call MD for:  hives     08/08/17 1556   08/08/17 0000  Call MD for:  persistant dizziness or light-headedness     08/08/17 1556   08/08/17 0000  Call MD for:  extreme fatigue     08/08/17 1556   08/08/17 0000  insulin aspart (NOVOLOG) 100 UNIT/ML injection  3 times daily with meals     08/08/17 1558        Disposition:  Home.  No new home health needs identified.   Discharged Condition: [name] has met maximum benefit of inpatient care and is medically stable and cleared for discharge.  Patient is pending follow up as above.      Time spent on disposition:  35 Minutes.   Signed: Brandi Ollis, NP-C [facility] Pgr: [phone] Office: [phone]  Patient seen and examined, agree with above note.  I dictated the care and orders written for this patient under my direction.  Yacoub, Wesam G, MD [phone] 

## 2017-08-08 NOTE — Evaluation (Signed)
Clinical/Bedside Swallow Evaluation Patient Details  Name: [name] MRN: [id number] Date of Birth: [birth year]  Today's Date: 08/08/2017 Time: SLP Start Time (ACUTE ONLY): 0931 SLP Stop Time (ACUTE ONLY): 0941 SLP Time Calculation (min) (ACUTE ONLY): 10 min  Past Medical History:  Past Medical History:  Diagnosis Date  . Anemia   . Anxiety   . Arthritis    "both hips; both shoulders" (06/06/2017)  . Chicken pox   . Childhood asthma   . Chronic pain   . Depression   . DKA (diabetic ketoacidoses) ([name]) 11/18/2014  . DKA (diabetic ketoacidoses) ([name]) 07/05/2017  . GERD (gastroesophageal reflux disease)   . Hypertension   . Migraine    "a few/year" (06/06/2017)  . Noncompliance with medication regimen   . Pneumonia    "several times" (06/06/2017)  . Polysubstance abuse   . Schizo affective schizophrenia ([name])   . Scoliosis   . Type I diabetes mellitus ([name])    Past Surgical History:  Past Surgical History:  Procedure Laterality Date  . CARDIAC CATHETERIZATION  11/2016  . INCISION AND DRAINAGE ABSCESS Left 11/2011   "MRSA removed off my thumb"   HPI:  Pt is a 31 yo M with Hx of type 1DM, multiple admissions for DKA, GERD, schizophrenia, polysubstance abuse, HTN, anxiety, who presented because of unresponsiveness. He had seizure-like activity in the ED, received ativan, and was intubated 8/18-8/21. Patient found to have severe anion gap acidosis due to DKA and lactic acidosis. Pt had a previous swallow evaluation 06/09/17 with functional appearing oropharyngeal swallow and symptoms felt to be related to his h/o GERD.   Assessment / Plan / Recommendation Clinical Impression  Assessment was limited by participation, with pt tearful throughout evaluation due to not wanting to eat, wanting his eye drops (RN aware), and wanting to go home. He consumed little amounts of solids/liquids with baseline coughing making it difficult to distinguish overt signs of aspiration. He did not  appear to have coughing that was immediately correlated with swallowing though, and his vocal quality was clear, not hoarse after extubation. His age is also a good prognositc indicator for his ability to protect his airway post-extubation. Recommend to continue current thin liquids but also allowing soft solids. Would provide intermittent supervision during PO intake today to monitor for tolerance. SLP will attempt additional f/u as well.` SLP Visit Diagnosis: Dysphagia, unspecified (R13.10)    Aspiration Risk  Mild aspiration risk;Moderate aspiration risk    Diet Recommendation Dysphagia 3 (Mech soft);Thin liquid   Liquid Administration via: Cup;Straw Medication Administration: Whole meds with puree Supervision: Patient able to self feed;Intermittent supervision to cue for compensatory strategies Compensations: Slow rate;Small sips/bites Postural Changes: Seated upright at 90 degrees;Remain upright for at least 30 minutes after po intake    Other  Recommendations Oral Care Recommendations: Oral care BID   Follow up Recommendations None      Frequency and Duration min 2x/week  1 week       Prognosis Prognosis for Safe Diet Advancement: Good Barriers to Reach Goals: Behavior      Swallow Study   General Date of Onset: 08/04/17 HPI: Pt is a 31 yo M with Hx of type 1DM, multiple admissions for DKA, GERD, schizophrenia, polysubstance abuse, HTN, anxiety, who presented because of unresponsiveness. He had seizure-like activity in the ED, received ativan, and was intubated 8/18-8/21. Patient found to have severe anion gap acidosis due to DKA and lactic acidosis. Pt had a previous swallow evaluation 06/09/17 with functional  appearing oropharyngeal swallow and symptoms felt to be related to his h/o GERD. Type of Study: Bedside Swallow Evaluation Previous Swallow Assessment: see HPI Diet Prior to this Study: Thin liquids Temperature Spikes Noted: Yes (100.2) Respiratory Status: Room  air History of Recent Intubation: Yes Length of Intubations (days): 4 days Date extubated: 08/07/17 Behavior/Cognition: Alert;Other (Comment) (tearful) Oral Care Completed by SLP: No Oral Cavity - Dentition: Poor condition;Missing dentition Vision: Functional for self-feeding Self-Feeding Abilities: Able to feed self Patient Positioning: Upright in bed Baseline Vocal Quality: Normal Volitional Cough: Strong    Oral/Motor/Sensory Function Overall Oral Motor/Sensory Function: Within functional limits   Ice Chips Ice chips: Not tested   Thin Liquid Thin Liquid: Impaired Presentation: Self Fed;Cup;Straw Pharyngeal  Phase Impairments: Cough - Delayed    Nectar Thick Nectar Thick Liquid: Not tested   Honey Thick Honey Thick Liquid: Not tested   Puree Puree: Impaired Presentation: Spoon Pharyngeal Phase Impairments: Cough - Delayed   Solid   GO   Solid: Impaired Presentation: Self Fed Oral Phase Impairments: Impaired mastication Pharyngeal Phase Impairments: Cough - Delayed        Maxcine Ham 08/08/2017,9:57 AM  Maxcine Ham, M.A. CCC-SLP [phone]

## 2017-08-08 NOTE — Consult Note (Addendum)
WOC Nurse wound consult note Reason for Consult: Consult requested for left heel Wound type: Unstageable pressure injury Pressure Injury POA: Yes Measurement: 6X4cm Wound bed: 100% tightly adhered eschar Drainage (amount, consistency, odor) No odor or drainage Dressing procedure/placement/frequency: Crosshatched eschar with a scalpel to allow better penetration of Santyl for enzymatic debridement.  Pt had mod amt bleeding from beneath the eschar, denies c/o pain. Float heels to reduce pressure.  Discussed plan of care with patient and he verbalized understanding. Please re-consult if further assistance is needed.  Thank-you,  Cammie Mcgee MSN, RN, CWOCN, [place], CNS [phone]

## 2017-08-08 NOTE — Progress Notes (Signed)
PT Cancellation Note  Patient Details Name: [name] MRN: [id number] DOB: 1986/11/17   Cancelled Treatment:    Reason Eval/Treat Not Completed: Patient not medically ready. RN just pulled L femoral line and is holding pressure. Pt to lay flat for minimally 1 hour. PT to return as able to complete eval.   Ora Mcnatt M Floride Hutmacher 08/08/2017, 1:18 PM   Lewis Shock, PT, DPT Pager #: [phone] Office #: [phone]

## 2017-08-08 NOTE — Progress Notes (Signed)
Pt d/ced home with Md's order. All instructions given to pt and informed his mom regarding meds changes. Given Santy ointment to take home. Pt been asking to send him home. Went home with provided cab voucher from ED entrance. Also updated his room mate.  Jophny,RN

## 2017-08-08 NOTE — Progress Notes (Addendum)
Inpatient Diabetes Program Recommendations  AACE/ADA: New Consensus Statement on Inpatient Glycemic Control (2015)  Target Ranges:  Prepandial:   less than 140 mg/dL      Peak postprandial:   less than 180 mg/dL (1-2 hours)      Critically ill patients:  140 - 180 mg/dL   Lab Results  Component Value Date   GLUCAP 106 (H) 08/08/2017   HGBA1C 9.5 (H) 07/30/2017    Review of Glycemic ControlResults for KEISON, GLENDINNING (MRN [id number]) as of 08/08/2017 13:04  Ref. Range 08/07/2017 20:25 08/07/2017 20:49 08/07/2017 21:16 08/07/2017 23:50 08/08/2017 03:30 08/08/2017 04:59 08/08/2017 08:43 08/08/2017 11:22  Glucose-Capillary Latest Ref Range: 65 - 99 mg/dL 40 (LL) 42 (LL) 001 (H) 78 59 (L) 140 (H) 106 (H) 106 (H)   Diabetes history: Type 1 diabetes Outpatient Diabetes medications: Levemir 45 units daily (however medication reconciliation notes patient not taking) Current orders for Inpatient glycemic control:  Novolog sensitive tid with meals and HS, Levemir 40 units daily, Novolog 5 units tid with meals  Inpatient Diabetes Program Recommendations:   Please reduce Levemir to 25 units daily and reduce Novolog meal coverage to 4 units tid with meals.   Thanks, Beryl Meager, RN, BC-ADM Inpatient Diabetes Coordinator Pager [phone] (8a-5p)

## 2017-08-08 NOTE — Progress Notes (Signed)
Unable to draw morning labs from central line 3 nurses attempted unable to collect. Phlebotomy has been called twice to request that he be a lab draw for morning labs.

## 2017-08-08 NOTE — Progress Notes (Signed)
A line removed, dressing applied. Pt in no signs of distress. Nursing will continue to monitor.

## 2017-08-08 NOTE — Care Management Note (Signed)
Case Management Note  Patient Details  Name: [name] MRN: [id number] Date of Birth: [birth year]  Subjective/Objective:   Pt admitted with DKA                 Action/Plan:  PTA is independent  from home with roommate.  Pt has PCP and denied barriers to obtaining medications as prescribed.  Bedside nurse consulted CSW for taxi cab voucher for transport home.  Pt denied barriers to returning to home.   Expected Discharge Date:  08/08/17               Expected Discharge Plan:  Home/Self Care  In-House Referral:     Discharge planning Services  CM Consult  Post Acute Care Choice:    Choice offered to:     DME Arranged:    DME Agency:     HH Arranged:    HH Agency:     Status of Service:  Completed, signed off  If discussed at [employer] of Stay Meetings, dates discussed:    Additional Comments:  Cherylann Parr, RN 08/08/2017, 4:20 PM

## 2017-08-08 NOTE — Progress Notes (Addendum)
Spoke with patient regarding home diabetes management.  He states that his blood sugars and diabetes is "controlled".  When I asked why he got sick, he said that he could not swallow and therefore did not take his Levemir.  Explained that he needs his basal insulin even if he is unable to eat (however he may need lower dose).  He says that he see's Dr. Roderic Palau for diabetes.  When he checks his blood sugar, he states that they are usually in the 200's.  I explained that this is not goal and that A1C is greater than goal.  Note that this is patients 7th admit since June, 2018 and he has pattern of leaving AMA.  Psych attempted to see patient on last admit and patient refused to talk with them.  Patient does not seem to understand the importance of management of diabetes and the dangers of repeated DKA.   Discussed with NP.  Recommend reducing Levemir to 30 units daily at home. He will also need meal coverage 5 units tid with meals.   He needs to follow-up with PCP within the next week.   Thanks, Beryl Meager, RN, BC-ADM Inpatient Diabetes Coordinator Pager [phone] ([phone]-5p)

## 2017-08-09 LAB — CULTURE, BLOOD (ROUTINE X 2)
CULTURE: NO GROWTH
CULTURE: NO GROWTH
SPECIAL REQUESTS: ADEQUATE
Special Requests: ADEQUATE

## 2017-08-17 ENCOUNTER — Emergency Department (HOSPITAL_COMMUNITY): Payer: Medicare Other

## 2017-08-17 ENCOUNTER — Observation Stay (HOSPITAL_COMMUNITY): Payer: Medicare Other

## 2017-08-17 ENCOUNTER — Inpatient Hospital Stay (HOSPITAL_COMMUNITY)
Admission: EM | Admit: 2017-08-17 | Discharge: 2017-08-20 | DRG: 637 | Disposition: A | Discharge status: Home / Self Care | Payer: Medicare Other | Attending: Internal Medicine | Admitting: Internal Medicine

## 2017-08-17 DIAGNOSIS — Z882 Allergy status to sulfonamides status: Secondary | ICD-10-CM

## 2017-08-17 DIAGNOSIS — F329 Major depressive disorder, single episode, unspecified: Secondary | ICD-10-CM | POA: Diagnosis present

## 2017-08-17 DIAGNOSIS — F259 Schizoaffective disorder, unspecified: Secondary | ICD-10-CM | POA: Diagnosis present

## 2017-08-17 DIAGNOSIS — H53133 Sudden visual loss, bilateral: Secondary | ICD-10-CM

## 2017-08-17 DIAGNOSIS — R29818 Other symptoms and signs involving the nervous system: Secondary | ICD-10-CM

## 2017-08-17 DIAGNOSIS — Z794 Long term (current) use of insulin: Secondary | ICD-10-CM

## 2017-08-17 DIAGNOSIS — Z9114 Patient's other noncompliance with medication regimen: Secondary | ICD-10-CM

## 2017-08-17 DIAGNOSIS — K219 Gastro-esophageal reflux disease without esophagitis: Secondary | ICD-10-CM | POA: Diagnosis present

## 2017-08-17 DIAGNOSIS — H547 Unspecified visual loss: Secondary | ICD-10-CM | POA: Diagnosis present

## 2017-08-17 DIAGNOSIS — L0390 Cellulitis, unspecified: Secondary | ICD-10-CM

## 2017-08-17 DIAGNOSIS — D6489 Other specified anemias: Secondary | ICD-10-CM | POA: Diagnosis present

## 2017-08-17 DIAGNOSIS — E876 Hypokalemia: Secondary | ICD-10-CM | POA: Diagnosis not present

## 2017-08-17 DIAGNOSIS — E10621 Type 1 diabetes mellitus with foot ulcer: Secondary | ICD-10-CM | POA: Diagnosis present

## 2017-08-17 DIAGNOSIS — E1011 Type 1 diabetes mellitus with ketoacidosis with coma: Secondary | ICD-10-CM | POA: Diagnosis not present

## 2017-08-17 DIAGNOSIS — E1039 Type 1 diabetes mellitus with other diabetic ophthalmic complication: Secondary | ICD-10-CM | POA: Diagnosis present

## 2017-08-17 DIAGNOSIS — F17210 Nicotine dependence, cigarettes, uncomplicated: Secondary | ICD-10-CM | POA: Diagnosis present

## 2017-08-17 DIAGNOSIS — E869 Volume depletion, unspecified: Secondary | ICD-10-CM | POA: Diagnosis present

## 2017-08-17 DIAGNOSIS — M419 Scoliosis, unspecified: Secondary | ICD-10-CM | POA: Diagnosis present

## 2017-08-17 DIAGNOSIS — G8929 Other chronic pain: Secondary | ICD-10-CM | POA: Diagnosis present

## 2017-08-17 DIAGNOSIS — E1010 Type 1 diabetes mellitus with ketoacidosis without coma: Principal | ICD-10-CM | POA: Diagnosis present

## 2017-08-17 DIAGNOSIS — Z833 Family history of diabetes mellitus: Secondary | ICD-10-CM

## 2017-08-17 DIAGNOSIS — I10 Essential (primary) hypertension: Secondary | ICD-10-CM | POA: Diagnosis present

## 2017-08-17 DIAGNOSIS — N179 Acute kidney failure, unspecified: Secondary | ICD-10-CM | POA: Diagnosis not present

## 2017-08-17 DIAGNOSIS — F419 Anxiety disorder, unspecified: Secondary | ICD-10-CM | POA: Diagnosis present

## 2017-08-17 DIAGNOSIS — E785 Hyperlipidemia, unspecified: Secondary | ICD-10-CM | POA: Diagnosis present

## 2017-08-17 DIAGNOSIS — L97529 Non-pressure chronic ulcer of other part of left foot with unspecified severity: Secondary | ICD-10-CM | POA: Diagnosis present

## 2017-08-17 DIAGNOSIS — Z79899 Other long term (current) drug therapy: Secondary | ICD-10-CM

## 2017-08-17 DIAGNOSIS — R17 Unspecified jaundice: Secondary | ICD-10-CM | POA: Diagnosis present

## 2017-08-17 DIAGNOSIS — G9341 Metabolic encephalopathy: Secondary | ICD-10-CM | POA: Diagnosis present

## 2017-08-17 DIAGNOSIS — J45909 Unspecified asthma, uncomplicated: Secondary | ICD-10-CM | POA: Diagnosis present

## 2017-08-17 LAB — GLUCOSE, CAPILLARY
GLUCOSE-CAPILLARY: 177 mg/dL — AB (ref 65–99)
GLUCOSE-CAPILLARY: 154 mg/dL — AB (ref 65–99)
Glucose-Capillary: 160 mg/dL — ABNORMAL HIGH (ref 65–99)
Glucose-Capillary: 174 mg/dL — ABNORMAL HIGH (ref 65–99)
Glucose-Capillary: 172 mg/dL — ABNORMAL HIGH (ref 65–99)

## 2017-08-17 LAB — BASIC METABOLIC PANEL
Anion gap: 13 (ref 5–15)
BUN: 12 mg/dL (ref 6–20)
BUN: 15 mg/dL (ref 6–20)
BUN: 13 mg/dL (ref 6–20)
CALCIUM: 8.7 mg/dL — AB (ref 8.9–10.3)
CALCIUM: 8.5 mg/dL — AB (ref 8.9–10.3)
CO2: <7 mmol/L — ABNORMAL LOW (ref 22–32)
CO2: <7 mmol/L — ABNORMAL LOW (ref 22–32)
CO2: 11 mmol/L — ABNORMAL LOW (ref 22–32)
CREATININE: 1.57 mg/dL — AB (ref 0.61–1.24)
CREATININE: 1.64 mg/dL — AB (ref 0.61–1.24)
CREATININE: 1.36 mg/dL — AB (ref 0.61–1.24)
Calcium: 8.5 mg/dL — ABNORMAL LOW (ref 8.9–10.3)
Chloride: 106 mmol/L (ref 101–111)
Chloride: 111 mmol/L (ref 101–111)
Chloride: 114 mmol/L — ABNORMAL HIGH (ref 101–111)
GFR calc Af Amer: >60 mL/min (ref >60)
GFR calc Af Amer: >60 mL/min (ref >60)
GFR calc Af Amer: >60 mL/min (ref >60)
GFR, EST NON AFRICAN AMERICAN: 57 mL/min — AB (ref >60)
GFR, EST NON AFRICAN AMERICAN: 54 mL/min — AB (ref >60)
GLUCOSE: 452 mg/dL — AB (ref 65–99)
GLUCOSE: 368 mg/dL — AB (ref 65–99)
GLUCOSE: 193 mg/dL — AB (ref 65–99)
POTASSIUM: 3.1 mmol/L — AB (ref 3.5–5.1)
Potassium: 4.1 mmol/L (ref 3.5–5.1)
Potassium: 4 mmol/L (ref 3.5–5.1)
SODIUM: 134 mmol/L — AB (ref 135–145)
Sodium: 135 mmol/L (ref 135–145)
Sodium: 138 mmol/L (ref 135–145)

## 2017-08-17 LAB — COMPREHENSIVE METABOLIC PANEL
ALBUMIN: 3.2 g/dL — AB (ref 3.5–5.0)
ALT: 56 U/L (ref 17–63)
AST: 31 U/L (ref 15–41)
Alkaline Phosphatase: 138 U/L — ABNORMAL HIGH (ref 38–126)
BUN: 14 mg/dL (ref 6–20)
CHLORIDE: 102 mmol/L (ref 101–111)
CO2: <7 mmol/L — ABNORMAL LOW (ref 22–32)
Calcium: 8.4 mg/dL — ABNORMAL LOW (ref 8.9–10.3)
Creatinine, Ser: 1.68 mg/dL — ABNORMAL HIGH (ref 0.61–1.24)
GFR calc Af Amer: >60 mL/min (ref >60)
GFR calc non Af Amer: 52 mL/min — ABNORMAL LOW (ref >60)
GLUCOSE: 553 mg/dL — AB (ref 65–99)
POTASSIUM: 4.5 mmol/L (ref 3.5–5.1)
Sodium: 130 mmol/L — ABNORMAL LOW (ref 135–145)
Total Bilirubin: 1.9 mg/dL — ABNORMAL HIGH (ref 0.3–1.2)
Total Protein: 5.9 g/dL — ABNORMAL LOW (ref 6.5–8.1)

## 2017-08-17 LAB — URINALYSIS, ROUTINE W REFLEX MICROSCOPIC
Bilirubin Urine: NEGATIVE
KETONES UR: 80 mg/dL — AB
LEUKOCYTES UA: NEGATIVE
Nitrite: NEGATIVE
PROTEIN: 30 mg/dL — AB
Specific Gravity, Urine: 1.016 (ref 1.005–1.030)
pH: 5 (ref 5.0–8.0)

## 2017-08-17 LAB — CBG MONITORING, ED
GLUCOSE-CAPILLARY: 384 mg/dL — AB (ref 65–99)
GLUCOSE-CAPILLARY: 244 mg/dL — AB (ref 65–99)
Glucose-Capillary: 477 mg/dL — ABNORMAL HIGH (ref 65–99)
Glucose-Capillary: 463 mg/dL — ABNORMAL HIGH (ref 65–99)
Glucose-Capillary: 427 mg/dL — ABNORMAL HIGH (ref 65–99)
Glucose-Capillary: 304 mg/dL — ABNORMAL HIGH (ref 65–99)
Glucose-Capillary: 328 mg/dL — ABNORMAL HIGH (ref 65–99)

## 2017-08-17 LAB — CBC WITH DIFFERENTIAL/PLATELET
BASOS PCT: 0 %
Basophils Absolute: 0 10*3/uL (ref 0.0–0.1)
Eosinophils Absolute: 0 10*3/uL (ref 0.0–0.7)
Eosinophils Relative: 0 %
HEMATOCRIT: 33 % — AB (ref 39.0–52.0)
HEMOGLOBIN: 10.7 g/dL — AB (ref 13.0–17.0)
LYMPHS PCT: 5 %
Lymphs Abs: 1.3 10*3/uL (ref 0.7–4.0)
MCH: 28.8 pg (ref 26.0–34.0)
MCHC: 32.4 g/dL (ref 30.0–36.0)
MCV: 88.7 fL (ref 78.0–100.0)
MONOS PCT: 5 %
Monocytes Absolute: 1.3 10*3/uL — ABNORMAL HIGH (ref 0.1–1.0)
NEUTROS ABS: 23.5 10*3/uL — AB (ref 1.7–7.7)
Neutrophils Relative %: 90 %
Platelets: 676 10*3/uL — ABNORMAL HIGH (ref 150–400)
RBC: 3.72 MIL/uL — ABNORMAL LOW (ref 4.22–5.81)
RDW: 14.5 % (ref 11.5–15.5)
WBC MORPHOLOGY: INCREASED
WBC: 26.1 10*3/uL — ABNORMAL HIGH (ref 4.0–10.5)

## 2017-08-17 LAB — HEPATIC FUNCTION PANEL
ALK PHOS: 159 U/L — AB (ref 38–126)
ALT: 60 U/L (ref 17–63)
AST: 24 U/L (ref 15–41)
Albumin: 3.4 g/dL — ABNORMAL LOW (ref 3.5–5.0)
Bilirubin, Direct: <0.1 mg/dL — ABNORMAL LOW (ref 0.1–0.5)
TOTAL PROTEIN: 6.6 g/dL (ref 6.5–8.1)
Total Bilirubin: 1.8 mg/dL — ABNORMAL HIGH (ref 0.3–1.2)

## 2017-08-17 LAB — LACTIC ACID, PLASMA: Lactic Acid, Venous: 1.7 mmol/L (ref 0.5–1.9)

## 2017-08-17 LAB — RAPID URINE DRUG SCREEN, HOSP PERFORMED
Amphetamines: NOT DETECTED
BENZODIAZEPINES: NOT DETECTED
Barbiturates: NOT DETECTED
COCAINE: NOT DETECTED
Opiates: NOT DETECTED
Tetrahydrocannabinol: NOT DETECTED

## 2017-08-17 LAB — PHOSPHORUS: PHOSPHORUS: 4.2 mg/dL (ref 2.5–4.6)

## 2017-08-17 LAB — CBC
HEMATOCRIT: 37.7 % — AB (ref 39.0–52.0)
Hemoglobin: 12.5 g/dL — ABNORMAL LOW (ref 13.0–17.0)
MCH: 28.7 pg (ref 26.0–34.0)
MCHC: 33.2 g/dL (ref 30.0–36.0)
MCV: 86.7 fL (ref 78.0–100.0)
Platelets: 701 10*3/uL — ABNORMAL HIGH (ref 150–400)
RBC: 4.35 MIL/uL (ref 4.22–5.81)
RDW: 14.4 % (ref 11.5–15.5)
WBC: 33.6 10*3/uL — ABNORMAL HIGH (ref 4.0–10.5)

## 2017-08-17 LAB — TSH: TSH: 0.365 u[IU]/mL (ref 0.350–4.500)

## 2017-08-17 LAB — I-STAT CG4 LACTIC ACID, ED: LACTIC ACID, VENOUS: 2.56 mmol/L — AB (ref 0.5–1.9)

## 2017-08-17 LAB — MAGNESIUM: Magnesium: 1.7 mg/dL (ref 1.7–2.4)

## 2017-08-17 LAB — CORTISOL: Cortisol, Plasma: 30.8 ug/dL

## 2017-08-17 MED ORDER — SODIUM CHLORIDE 0.9 % IV SOLN
INTRAVENOUS | Status: DC
  Administered 2017-08-17: 4 [IU]/h via INTRAVENOUS
  Filled 2017-08-17: qty 1

## 2017-08-17 MED ORDER — STERILE WATER FOR INJECTION IV SOLN: INTRAVENOUS | Status: DC

## 2017-08-17 MED ORDER — SODIUM CHLORIDE 0.9 % IV BOLUS (SEPSIS)
1000.0000 mL | Freq: Once | INTRAVENOUS | Status: AC
  Administered 2017-08-17: 1000 mL via INTRAVENOUS

## 2017-08-17 MED ORDER — POTASSIUM CHLORIDE 10 MEQ/100ML IV SOLN: 10.0000 meq | INTRAVENOUS | Status: AC

## 2017-08-17 MED ORDER — POTASSIUM CHLORIDE 10 MEQ/100ML IV SOLN
10.0000 meq | INTRAVENOUS | Status: AC
  Administered 2017-08-18 (×4): 10 meq via INTRAVENOUS
  Filled 2017-08-17 (×4): qty 100

## 2017-08-17 MED ORDER — INSULIN ASPART 100 UNIT/ML ~~LOC~~ SOLN
10.0000 [IU] | Freq: Once | SUBCUTANEOUS | Status: AC
  Administered 2017-08-17: 10 [IU] via SUBCUTANEOUS
  Filled 2017-08-17: qty 1

## 2017-08-17 MED ORDER — PIPERACILLIN-TAZOBACTAM 3.375 G IVPB 30 MIN
3.3750 g | Freq: Once | INTRAVENOUS | Status: DC
  Filled 2017-08-17: qty 50

## 2017-08-17 MED ORDER — IOPAMIDOL (ISOVUE-370) INJECTION 76%
INTRAVENOUS | Status: AC
  Administered 2017-08-17: 50 mL via INTRAVENOUS
  Filled 2017-08-17: qty 100

## 2017-08-17 MED ORDER — PIPERACILLIN-TAZOBACTAM 3.375 G IVPB
3.3750 g | Freq: Three times a day (TID) | INTRAVENOUS | Status: DC
  Administered 2017-08-17 – 2017-08-20 (×9): 3.375 g via INTRAVENOUS
  Filled 2017-08-17 (×12): qty 50

## 2017-08-17 MED ORDER — DEXTROSE-NACL 5-0.45 % IV SOLN
INTRAVENOUS | Status: DC
  Administered 2017-08-17: 18:00:00 via INTRAVENOUS

## 2017-08-17 MED ORDER — LACTATED RINGERS IV BOLUS (SEPSIS)
1000.0000 mL | Freq: Once | INTRAVENOUS | Status: AC
  Administered 2017-08-17: 1000 mL via INTRAVENOUS

## 2017-08-17 MED ORDER — DEXTROSE-NACL 5-0.45 % IV SOLN: INTRAVENOUS | Status: DC

## 2017-08-17 MED ORDER — PIPERACILLIN-TAZOBACTAM 3.375 G IVPB 30 MIN
3.3750 g | INTRAVENOUS | Status: DC
  Administered 2017-08-17: 3.375 g via INTRAVENOUS

## 2017-08-17 MED ORDER — SODIUM CHLORIDE 0.9 % IV BOLUS (SEPSIS)
1000.0000 mL | Freq: Once | INTRAVENOUS | Status: DC
  Administered 2017-08-17: 1000 mL via INTRAVENOUS

## 2017-08-17 MED ORDER — VANCOMYCIN HCL IN DEXTROSE 1-5 GM/200ML-% IV SOLN
1000.0000 mg | Freq: Two times a day (BID) | INTRAVENOUS | Status: DC
  Administered 2017-08-17 – 2017-08-20 (×7): 1000 mg via INTRAVENOUS
  Filled 2017-08-17 (×8): qty 200

## 2017-08-17 MED ORDER — SODIUM CHLORIDE 0.9 % IV SOLN: INTRAVENOUS | Status: DC

## 2017-08-17 MED ORDER — INSULIN REGULAR HUMAN 100 UNIT/ML IJ SOLN
INTRAMUSCULAR | Status: DC
  Administered 2017-08-17: 5.7 [IU]/h via INTRAVENOUS
  Filled 2017-08-17: qty 1

## 2017-08-17 MED ORDER — HEPARIN SODIUM (PORCINE) 5000 UNIT/ML IJ SOLN
5000.0000 [IU] | Freq: Three times a day (TID) | INTRAMUSCULAR | Status: DC
  Administered 2017-08-17 – 2017-08-20 (×9): 5000 [IU] via SUBCUTANEOUS
  Filled 2017-08-17 (×11): qty 1

## 2017-08-17 MED ORDER — PANTOPRAZOLE SODIUM 40 MG IV SOLR
40.0000 mg | INTRAVENOUS | Status: DC
  Administered 2017-08-17 – 2017-08-18 (×2): 40 mg via INTRAVENOUS
  Filled 2017-08-17 (×3): qty 40

## 2017-08-17 MED ORDER — VANCOMYCIN HCL 10 G IV SOLR
1750.0000 mg | INTRAVENOUS | Status: DC
  Filled 2017-08-17: qty 1750

## 2017-08-17 MED ORDER — SODIUM CHLORIDE 0.9 % IV BOLUS (SEPSIS): 1000.0000 mL | Freq: Once | INTRAVENOUS | Status: DC

## 2017-08-17 MED ORDER — ONDANSETRON HCL 4 MG/2ML IJ SOLN: 4.0000 mg | Freq: Four times a day (QID) | INTRAMUSCULAR | Status: DC | PRN

## 2017-08-17 NOTE — ED Notes (Addendum)
Pt refusing IV placement. Explained to patient we need IV access in order to treat the patients symptoms. Pt still refusing. "I said no". Marlon Pel, PA-C notified

## 2017-08-17 NOTE — Progress Notes (Signed)
eLink Physician-Brief Progress Note Patient Name: [name] DOB: 01-30-1986 MRN: [id number]   Date of Service  08/17/2017  HPI/Events of Note  Male admitted with DKA. Last electrolyte panel at 2:30. Currently on DKA protocol. Camera shows patient resting comfortably. No family at bedside. Currently on insulin drip.   eICU Interventions  Continuing plan of care as per admitting intensivist.      Intervention Category Evaluation Type: New Patient Evaluation  Lawanda Cousins 08/17/2017, 6:28 PM

## 2017-08-17 NOTE — ED Triage Notes (Addendum)
Pt arrives via EMs from home with hyperglcemia, extreme thirst, vision loss since around 5am. Pt LSN at 1130pm last night. CBG check 362. Pt arouses to voice, answers questions, oriented x3. 124/79, HR 100, RR30, 100%RA.

## 2017-08-17 NOTE — ED Notes (Signed)
Critical Care made aware patient HR elevated.

## 2017-08-17 NOTE — ED Notes (Signed)
Critical Care aware patient IV infiltrated. IV team consult placed. Unable to get additional IV.

## 2017-08-17 NOTE — ED Notes (Signed)
Current IV access infiltrated.  I have searched for new access without success.

## 2017-08-17 NOTE — ED Notes (Signed)
EDP at bedside  

## 2017-08-17 NOTE — Progress Notes (Signed)
eLink Physician-Brief Progress Note Patient Name: [name] DOB: 04/25/1986 MRN: [id number]   Date of Service  08/17/2017  HPI/Events of Note    eICU Interventions  Hypokalemia -repleted      Intervention Category Intermediate Interventions: Electrolyte abnormality - evaluation and management  [name] V. 08/17/2017, 9:41 PM

## 2017-08-17 NOTE — ED Notes (Signed)
Called Lab will add labs

## 2017-08-17 NOTE — ED Provider Notes (Signed)
[facility] DEPT Provider Note   CSN: [id number] Arrival date & time: 08/17/17  0720     History   Chief Complaint Chief Complaint  Patient presents with  . Hyperglycemia    HPI [name] is a 31 y.o. male.  HPI   31yo male with history of DM, htn, presents with concern for inability to see, excessive thirst.  Reports he cannot see anything out of either eye, that it is complete darkness. Reports it started "a few hours ago" but is unable to say what time. Last seen normal reportedly 1130PM. He cannot say exactly what time symptoms started. Denies double vision or missing vision and reports complete vision loss bilaterally. Reports he is very thirsty. Nausea. No emesis, no abdominal pain. No chest pain. Denies numbness, weakness, difficulty speaking, dizziness. Reports frequency.   Patient reports he has not taken his insulin in 2 days.  Past Medical History:  Diagnosis Date  . Anemia   . Anxiety   . Arthritis    "both hips; both shoulders" (06/06/2017)  . Chicken pox   . Childhood asthma   . Chronic pain   . Depression   . DKA (diabetic ketoacidoses) ([place]) 11/18/2014  . DKA (diabetic ketoacidoses) ([place]) 07/05/2017  . GERD (gastroesophageal reflux disease)   . Hypertension   . Migraine    "a few/year" (06/06/2017)  . Noncompliance with medication regimen   . Pneumonia    "several times" (06/06/2017)  . Polysubstance abuse   . 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 Scoliosis   . Type I diabetes mellitus [facility])     Patient Active Problem List   Diagnosis Date Noted  . DKA, type 1 ([place]) 08/17/2017  . Encounter for imaging study to confirm orogastric (OG) tube placement   . Acute respiratory acidosis   . DKA (diabetic ketoacidoses) ([place]) 08/04/2017  . Acute respiratory failure with hypoxia ([place])   . Seizure ([place])   . Diabetic ketoacidosis ([place]) 07/30/2017  . Normocytic anemia 07/05/2017  . Numbness of right hand 07/05/2017  . Elevated troponin I  measurement   . Positive D dimer   . DKA, type 1, not at goal [facility]) 07/01/2017  . Hyperkalemia   . AKI (acute kidney injury) ([place])   . Medically noncompliant   . Hyperglycemia 06/16/2017  . Nausea & vomiting 06/15/2017  . Abdominal pain 06/15/2017  . Chest pain 06/07/2017  . GERD (gastroesophageal reflux disease) 06/07/2017  . Substance abuse 05/30/2016  . Substance induced mood disorder ([place]) 05/30/2016  . Tobacco use disorder 12/24/2015  . DM hyperosmolarity type I, uncontrolled ([place]) 12/19/2015  . Generalized headache 07/22/2015  . Bilateral hip bursitis 05/26/2015  . Depression   . Generalized anxiety disorder 03/29/2015  . Undifferentiated schizophrenia ([place])   . Hip pain, bilateral 11/18/2014  . Dehydration 06/17/2012    Past Surgical History:  Procedure Laterality Date  . CARDIAC CATHETERIZATION  11/2016  . INCISION AND DRAINAGE ABSCESS Left 11/2011   "MRSA removed off my thumb"       Home Medications    Prior to Admission medications   Medication Sig Start Date End Date Taking? Authorizing Provider  amphetamine-dextroamphetamine (ADDERALL) 30 MG tablet Take 1 tablet by mouth daily. 06/26/17   [provider]  blood glucose meter kit and supplies KIT Dispense based on patient and insurance preference. Use up to four times daily as directed. (FOR ICD-9 250.00, 250.01). Patient not taking: Reported on 08/04/2017 06/18/17   Damita Lack, MD  collagenase (  SANTYL) ointment Apply Santyl to left heel wound Q day, then cover with moist gauze and 4X4 and kerlex 08/08/17   Ollis, Brandi L, NP  doxepin (SINEQUAN) 10 MG/ML solution Take 10 mLs by mouth at bedtime. 07/11/17   [provider]  glucose blood test strip Use as instructed Patient not taking: Reported on 08/04/2017 06/18/17   Amin, Jeanella Flattery, MD  insulin aspart (NOVOLOG FLEXPEN) 100 UNIT/ML FlexPen Check Blood Sugar 4 times per day > with meals and at bedtime CBG 70 - 120: 0 units CBG 121 - 150:  2 units CBG 151 - 200: 3 units CBG 201 - 250: 5 units CBG 251 - 300: 8 units CBG 301 - 350: 11 units CBG 351 - 400: 15 units 08/08/17   Ollis, Brandi L, NP  insulin aspart (NOVOLOG) 100 UNIT/ML injection Inject 5 Units into the skin 3 (three) times daily with meals. 08/08/17   Donita Brooks, NP  insulin detemir (LEVEMIR) 100 UNIT/ML injection Inject 0.3 mLs (30 Units total) into the skin daily. 08/08/17   Donita Brooks, NP  Insulin Pen Needle (PEN NEEDLES) 31G X 5 MM MISC 90 each by Does not apply route 4 (four) times daily -  before meals and at bedtime. Patient not taking: Reported on 08/04/2017 06/11/17   Hosie Poisson, MD  Lancets (ACCU-CHEK MULTICLIX) lancets Use as directed Patient not taking: Reported on 08/04/2017 06/18/17   Damita Lack, MD  metoCLOPramide (REGLAN) 10 MG tablet Take 1 tablet (10 mg total) by mouth 3 (three) times daily with meals. 06/11/17 06/11/18  Hosie Poisson, MD  ondansetron (ZOFRAN ODT) 4 MG disintegrating tablet Take 1 tablet (4 mg total) by mouth every 8 (eight) hours as needed for nausea or vomiting. 06/19/17   Amin, Jeanella Flattery, MD  pantoprazole (PROTONIX) 40 MG tablet Take 1 tablet (40 mg total) by mouth daily. 06/10/17   Hosie Poisson, MD  traMADol (ULTRAM) 50 MG tablet Take 1-2 tablets (50-100 mg total) by mouth every 6 (six) hours as needed for moderate pain or severe pain. Patient not taking: Reported on 07/30/2017 07/06/17   Donne Hazel, MD  traZODone (DESYREL) 150 MG tablet Take 150 mg by mouth at bedtime. 06/26/17   [provider]    Family History Family History  Problem Relation Age of Onset  . Diabetes Mother     Social History Social History  Substance Use Topics  . Smoking status: Current Every Day Smoker    Packs/day: 0.50    Years: 9.00    Types: Cigarettes  . Smokeless tobacco: Never Used  . Alcohol use No     Allergies   Sulfa antibiotics   Review of Systems Review of Systems  Unable to perform ROS: Acuity of  condition  Constitutional: Positive for fatigue. Negative for fever.  Eyes: Positive for visual disturbance.  Cardiovascular: Negative for chest pain.  Gastrointestinal: Positive for nausea.  Endocrine: Positive for polydipsia and polyuria.  Genitourinary: Positive for frequency.  Skin: Positive for wound (left heel).  Neurological: Negative for dizziness, seizures, syncope, facial asymmetry, speech difficulty, weakness, numbness and headaches.     Physical Exam Updated Vital Signs BP 109/67   Pulse (!) 126   Temp 99.8 F (37.7 C) (Axillary)   Resp 15   Ht '6\' 2"'$  (1.88 m)   Wt 89 kg (196 lb 3.4 oz)   SpO2 100%   BMI 25.19 kg/m   Physical Exam  Constitutional: He appears well-developed. He appears listless.  He has a sickly appearance. He appears ill. No distress.  HENT:  Head: Normocephalic and atraumatic.  Eyes: Conjunctivae and EOM are normal.  Dysconjugate gaze, pupils 42m, not reactive to light. Patient without vision-does not blink to threat, reports does not see light or motion  Neck: Normal range of motion.  Cardiovascular: Normal rate, regular rhythm, normal heart sounds and intact distal pulses.  Exam reveals no gallop and no friction rub.   No murmur heard. Pulmonary/Chest: Breath sounds normal. Tachypnea noted. No respiratory distress. He has no wheezes. He has no rales.  Abdominal: Soft. He exhibits no distension. There is no tenderness. There is no guarding.  Musculoskeletal: He exhibits no edema.  Neurological: He appears listless.  Slight left facial droop Dysconjugate gaze however normal movements of eyes swhen directed. No vision, no pupillary reaction, does not blink to threat Normal strength and sensation of upper and lower extremities, midline tongue Unable to complete finger to nose   Skin: Skin is warm and dry. He is not diaphoretic.  Wound left heel, no significant surrounding erythema  Nursing note and vitals reviewed.    ED Treatments / Results    Labs (all labs ordered are listed, but only abnormal results are displayed) Labs Reviewed  CBC WITH DIFFERENTIAL/PLATELET - Abnormal; Notable for the following:       Result Value   WBC 26.1 (*)    RBC 3.72 (*)    Hemoglobin 10.7 (*)    HCT 33.0 (*)    Platelets 676 (*)    Neutro Abs 23.5 (*)    Monocytes Absolute 1.3 (*)    All other components within normal limits  COMPREHENSIVE METABOLIC PANEL - Abnormal; Notable for the following:    Sodium 130 (*)    CO2 <7 (*)    Glucose, Bld 553 (*)    Creatinine, Ser 1.68 (*)    Calcium 8.4 (*)    Total Protein 5.9 (*)    Albumin 3.2 (*)    Alkaline Phosphatase 138 (*)    Total Bilirubin 1.9 (*)    GFR calc non Af Amer 52 (*)    All other components within normal limits  URINALYSIS, ROUTINE W REFLEX MICROSCOPIC - Abnormal; Notable for the following:    APPearance HAZY (*)    Glucose, UA >=500 (*)    Hgb urine dipstick MODERATE (*)    Ketones, ur 80 (*)    Protein, ur 30 (*)    Bacteria, UA RARE (*)    Squamous Epithelial / LPF 0-5 (*)    All other components within normal limits  BASIC METABOLIC PANEL - Abnormal; Notable for the following:    Sodium 134 (*)    CO2 <7 (*)    Glucose, Bld 452 (*)    Creatinine, Ser 1.57 (*)    Calcium 8.7 (*)    GFR calc non Af Amer 57 (*)    All other components within normal limits  BASIC METABOLIC PANEL - Abnormal; Notable for the following:    CO2 <7 (*)    Glucose, Bld 368 (*)    Creatinine, Ser 1.64 (*)    Calcium 8.5 (*)    GFR calc non Af Amer 54 (*)    All other components within normal limits  BASIC METABOLIC PANEL - Abnormal; Notable for the following:    Potassium 3.1 (*)    Chloride 114 (*)    CO2 11 (*)    Glucose, Bld 193 (*)    Creatinine, Ser  1.36 (*)    Calcium 8.5 (*)    All other components within normal limits  CBC - Abnormal; Notable for the following:    WBC 33.6 (*)    Hemoglobin 12.5 (*)    HCT 37.7 (*)    Platelets 701 (*)    All other components within  normal limits  HEPATIC FUNCTION PANEL - Abnormal; Notable for the following:    Albumin 3.4 (*)    Alkaline Phosphatase 159 (*)    Total Bilirubin 1.8 (*)    Bilirubin, Direct <0.1 (*)    All other components within normal limits  GLUCOSE, CAPILLARY - Abnormal; Notable for the following:    Glucose-Capillary 160 (*)    All other components within normal limits  GLUCOSE, CAPILLARY - Abnormal; Notable for the following:    Glucose-Capillary 174 (*)    All other components within normal limits  GLUCOSE, CAPILLARY - Abnormal; Notable for the following:    Glucose-Capillary 177 (*)    All other components within normal limits  GLUCOSE, CAPILLARY - Abnormal; Notable for the following:    Glucose-Capillary 172 (*)    All other components within normal limits  CBG MONITORING, ED - Abnormal; Notable for the following:    Glucose-Capillary 477 (*)    All other components within normal limits  CBG MONITORING, ED - Abnormal; Notable for the following:    Glucose-Capillary 463 (*)    All other components within normal limits  I-STAT CG4 LACTIC ACID, ED - Abnormal; Notable for the following:    Lactic Acid, Venous 2.56 (*)    All other components within normal limits  CBG MONITORING, ED - Abnormal; Notable for the following:    Glucose-Capillary 427 (*)    All other components within normal limits  CBG MONITORING, ED - Abnormal; Notable for the following:    Glucose-Capillary 384 (*)    All other components within normal limits  CBG MONITORING, ED - Abnormal; Notable for the following:    Glucose-Capillary 304 (*)    All other components within normal limits  CBG MONITORING, ED - Abnormal; Notable for the following:    Glucose-Capillary 328 (*)    All other components within normal limits  CBG MONITORING, ED - Abnormal; Notable for the following:    Glucose-Capillary 244 (*)    All other components within normal limits  CULTURE, BLOOD (ROUTINE X 2)  CULTURE, BLOOD (ROUTINE X 2)  RAPID  URINE DRUG SCREEN, HOSP PERFORMED  LACTIC ACID, PLASMA  MAGNESIUM  PHOSPHORUS  TSH  CORTISOL  BASIC METABOLIC PANEL  BASIC METABOLIC PANEL  BASIC METABOLIC PANEL  I-STAT CHEM 8, ED  I-STAT VENOUS BLOOD GAS, ED    EKG  EKG Interpretation None       Radiology Ct Angio Head W Or Wo Contrast  Result Date: 08/17/2017 CLINICAL DATA:  Code stroke.  Complete visual loss.  Hyperglycemia. EXAM: CT ANGIOGRAPHY HEAD AND NECK TECHNIQUE: Multidetector CT imaging of the head and neck was performed using the standard protocol during bolus administration of intravenous contrast. Multiplanar CT image reconstructions and MIPs were obtained to evaluate the vascular anatomy. Carotid stenosis measurements (when applicable) are obtained utilizing NASCET criteria, using the distal internal carotid diameter as the denominator. CONTRAST:  Dose currently not available.  Reference EMR. COMPARISON:  Head CT earlier today FINDINGS: CTA NECK FINDINGS Aortic arch: Normal.  Three vessel branching. Right carotid system: Smooth and widely patent. No atheromatous changes. Left carotid system: Smooth and widely patent. No  atheromatous changes. Vertebral arteries: No proximal subclavian stenosis. Codominant vertebral arteries that are smooth and widely patent. Skeleton: No acute or aggressive finding. There is poor dentition with associated sclerosis of the alveolar ridge in both the mandible and maxilla. Other neck: No noted mass or inflammation. Upper chest: Negative Review of the MIP images confirms the above findings CTA HEAD FINDINGS Anterior circulation: Vessels are smooth and widely patent. Negative for aneurysm or beading. Posterior circulation: Codominant vertebral arteries. The vertebral and basilar arteries are widely patent. No branch occlusion or stenosis noted. Negative for beading or aneurysm. Venous sinuses: Patent Anatomic variants: Incomplete circle-of-Willis at the right posterior communicating artery. Delayed  phase: Not performed in the emergent setting. Review of the MIP images confirms the above findings IMPRESSION: Negative CTA of the head and neck. Electronically Signed   By: Monte Fantasia M.D.   On: 08/17/2017 09:12   Ct Angio Neck W Or Wo Contrast  Result Date: 08/17/2017 CLINICAL DATA:  Code stroke.  Complete visual loss.  Hyperglycemia. EXAM: CT ANGIOGRAPHY HEAD AND NECK TECHNIQUE: Multidetector CT imaging of the head and neck was performed using the standard protocol during bolus administration of intravenous contrast. Multiplanar CT image reconstructions and MIPs were obtained to evaluate the vascular anatomy. Carotid stenosis measurements (when applicable) are obtained utilizing NASCET criteria, using the distal internal carotid diameter as the denominator. CONTRAST:  Dose currently not available.  Reference EMR. COMPARISON:  Head CT earlier today FINDINGS: CTA NECK FINDINGS Aortic arch: Normal.  Three vessel branching. Right carotid system: Smooth and widely patent. No atheromatous changes. Left carotid system: Smooth and widely patent. No atheromatous changes. Vertebral arteries: No proximal subclavian stenosis. Codominant vertebral arteries that are smooth and widely patent. Skeleton: No acute or aggressive finding. There is poor dentition with associated sclerosis of the alveolar ridge in both the mandible and maxilla. Other neck: No noted mass or inflammation. Upper chest: Negative Review of the MIP images confirms the above findings CTA HEAD FINDINGS Anterior circulation: Vessels are smooth and widely patent. Negative for aneurysm or beading. Posterior circulation: Codominant vertebral arteries. The vertebral and basilar arteries are widely patent. No branch occlusion or stenosis noted. Negative for beading or aneurysm. Venous sinuses: Patent Anatomic variants: Incomplete circle-of-Willis at the right posterior communicating artery. Delayed phase: Not performed in the emergent setting. Review of  the MIP images confirms the above findings IMPRESSION: Negative CTA of the head and neck. Electronically Signed   By: Monte Fantasia M.D.   On: 08/17/2017 09:12   Dg Foot 2 Views Left  Result Date: 08/17/2017 CLINICAL DATA:  Diabetic with heel ulcer. EXAM: LEFT FOOT - 2 VIEW COMPARISON:  Radiographs 07/30/2017 and 07/06/2017. FINDINGS: There is apparent bandage material over the plantar aspect of the hindfoot and midfoot. Mild adjacent soft tissue irregularity is suggested on the lateral view, consistent with an ulcer. No definite soft tissue emphysema or bone destruction. There is no evidence of acute fracture or dislocation. The joint spaces appear maintained. IMPRESSION: No osseous abnormality demonstrated. Plantar hindfoot soft tissue irregularity consistent with an ulcer. Electronically Signed   By: Richardean Sale M.D.   On: 08/17/2017 11:47   Ct Head Code Stroke Wo Contrast  Result Date: 08/17/2017 CLINICAL DATA:  Code stroke.  Complete visual loss. EXAM: CT HEAD WITHOUT CONTRAST TECHNIQUE: Contiguous axial images were obtained from the base of the skull through the vertex without intravenous contrast. COMPARISON:  08/04/2017 FINDINGS: Brain: No evidence of acute infarction, hemorrhage, hydrocephalus, extra-axial collection or  mass lesion/mass effect. Vascular: No hyperdense vessel or unexpected calcification. Skull: Normal. Negative for fracture or focal lesion. Sinuses/Orbits: No acute finding. Other: Text page with results sent on 08/17/2017 at 8:52 am to Dr. Leonel Ramsay. ASPECTS [facility] Stroke Program Early CT Score) Not scored with this symptomatology. IMPRESSION: Negative head CT. Electronically Signed   By: Monte Fantasia M.D.   On: 08/17/2017 08:53    Procedures Procedures (including critical care time)  Medications Ordered in ED Medications  0.9 %  sodium chloride infusion (not administered)  dextrose 5 %-0.45 % sodium chloride infusion ( Intravenous Rate/Dose Verify 08/17/17 1900)    insulin regular (NOVOLIN R,HUMULIN R) 100 Units in sodium chloride 0.9 % 100 mL (1 Units/mL) infusion (5.6 Units/hr Intravenous Rate/Dose Change 08/17/17 2155)  heparin injection 5,000 Units (5,000 Units Subcutaneous Given 08/17/17 1505)  potassium chloride 10 mEq in 100 mL IVPB (not administered)  pantoprazole (PROTONIX) injection 40 mg (40 mg Intravenous Given 08/17/17 1506)  ondansetron (ZOFRAN) injection 4 mg (not administered)  piperacillin-tazobactam (ZOSYN) IVPB 3.375 g (not administered)  vancomycin (VANCOCIN) IVPB 1000 mg/200 mL premix (0 mg Intravenous Stopped 08/17/17 1458)  piperacillin-tazobactam (ZOSYN) IVPB 3.375 g (3.375 g Intravenous New Bag/Given 08/17/17 1759)  potassium chloride 10 mEq in 100 mL IVPB (not administered)  insulin aspart (novoLOG) injection 10 Units (10 Units Subcutaneous Given 08/17/17 0741)  sodium chloride 0.9 % bolus 1,000 mL (1,000 mLs Intravenous New Bag/Given 08/17/17 1137)  iopamidol (ISOVUE-370) 76 % injection (50 mLs Intravenous Contrast Given 08/17/17 0912)  lactated ringers bolus 1,000 mL (0 mLs Intravenous Stopped 08/17/17 1600)  Angiocath insertion Performed by: Alvino Chapel  Consent: Verbal consent obtained. Risks and benefits: risks, benefits and alternatives were discussed Time out: Immediately prior to procedure a "time out" was called to verify the correct patient, procedure, equipment, support staff and site/side marked as required.  Preparation: Patient was prepped and draped in the usual sterile fashion.  Vein Location: RUE  Ultrasound Guided  Gauge: 20  Normal blood return and flush without difficulty Patient tolerance: Patient tolerated the procedure well with no immediate complications.    CRITICAL CARE: DKA, neuro def Performed by: Alvino Chapel   Total critical care time: 50 minutes  Critical care time was exclusive of separately billable procedures and treating other patients.  Critical care was  necessary to treat or prevent imminent or life-threatening deterioration.  Critical care was time spent personally by me on the following activities: development of treatment plan with patient and/or surrogate as well as nursing, discussions with consultants, evaluation of patient's response to treatment, examination of patient, obtaining history from patient or surrogate, ordering and performing treatments and interventions, ordering and review of laboratory studies, ordering and review of radiographic studies, pulse oximetry and re-evaluation of patient's condition.   Initial Impression / Assessment and Plan / ED Course  I have reviewed the triage vital signs and the nursing notes.  Pertinent labs & imaging results that were available during my care of the patient were reviewed by me and considered in my medical decision making (see chart for details).     31yo male with history of DM, htn, presents with concern for inability to see and excessive thirst.  Clinical concern for DKA on patient presentation given tachypnea, thirst.  Given loss of vision, no pupillary reaction, dysconjugate gaze, emergently consulted and discussed patient with Neurology.  Unclear onset of symptoms with patient not reliable historian, reports not sure what time symptoms started.  Given this, he  is not appropriate tPA candidate but given concern for possible large vessel/posterior occlusion he was taken emergently to CT for CTA and perfusion study. Patient difficult access and US guided access established prior to CT.  Neurology with pt to CT, exam shows no acute abnormalities. Blood gas and chem 8 returned showing concern for metabolic acidosis, suspect DKA with pH of 6.7.  Patient reports not using insulin for 2 days, overall suspect DKA and noncompliance as etiology. Neuro deficit likely metabolic but may need continued evaluation if he does not improve.  Patient hypothermic. Ordered blood cx, initially ordered vanc/zosyn  for possible infxn and bicarb given pH less than 6.9, but after discussion with critical care canceled these orders.  Patient with continued respiratory effort, does not appear to be tiring, will awaken answer questions and do not feel intubation indicated at this time.  Critical care admitting.  Final Clinical Impressions(s) / ED Diagnoses   Final diagnoses:  Diabetic ketoacidosis without coma associated with type 1 diabetes mellitus ([place])  Sudden visual loss of both eyes    New Prescriptions Current Discharge Medication List       Gareth Morgan, MD 08/17/17 2249

## 2017-08-17 NOTE — ED Notes (Signed)
X-ray at bedside

## 2017-08-17 NOTE — ED Notes (Signed)
Attempted report 

## 2017-08-17 NOTE — ED Notes (Signed)
Stage 2 Ulcer on left foot.

## 2017-08-17 NOTE — Consult Note (Signed)
Requesting Physician: Dr. 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 male arriving to the hospital from home hyperglycemic with (per chart LSN 11:30 PM).  While in the ED he noted he could not see. BG in the 500's.  On Exam concerning factors were his pupils were 4 mm and non reactive. He was unable to see light and breathing heavily. He was a poor historian but able to follow commands. Due to concern for posterior circulation he was rushed for CTA head and neck with perfusion.   Date last known well: Date: 08/16/2017 Time last known well: Time: 23:30 tPA Given: No: no last known normal  Modified Rankin: Rankin Score=0   Past Medical History:  Diagnosis Date  . Anemia   . Anxiety   . Arthritis    "both hips; both shoulders" (06/06/2017)  . Chicken pox   . Childhood asthma   . Chronic pain   . Depression   . DKA (diabetic ketoacidoses) ([place]) 11/18/2014  . DKA (diabetic ketoacidoses) ([place]) 07/05/2017  . GERD (gastroesophageal reflux disease)   . Hypertension   . Migraine    "a few/year" (06/06/2017)  . Noncompliance with medication regimen   . Pneumonia    "several times" (06/06/2017)  . Polysubstance abuse   . Schizo affective schizophrenia ([place])   . Scoliosis   . Type I diabetes mellitus ([place])     Past Surgical History:  Procedure Laterality Date  . CARDIAC CATHETERIZATION  11/2016  . INCISION AND DRAINAGE ABSCESS Left 11/2011   "MRSA removed off my thumb"    Family History  Problem Relation Age of Onset  . Diabetes Mother    Social History:  reports that he has been smoking Cigarettes.  He has a 4.50 pack-year smoking history. He has never used smokeless tobacco. He reports that he uses drugs, including Marijuana, Cocaine,  Methamphetamines, and "Crack" cocaine. He reports that he does not drink alcohol.  Allergies:  Allergies  Allergen Reactions  . Sulfa Antibiotics Other (See Comments)    Unknown childhood allergy    Medications:  Current Facility-Administered Medications  Medication Dose Route Frequency Provider Last Rate Last Dose  . sodium chloride 0.9 % bolus 1,000 mL  1,000 mL Intravenous Once Gareth Morgan, MD       And  . sodium chloride 0.9 % bolus 1,000 mL  1,000 mL Intravenous Once Gareth Morgan, MD       And  . 0.9 %  sodium chloride infusion   Intravenous Continuous Schlossman, Erin, MD      . dextrose 5 %-0.45 % sodium chloride infusion   Intravenous Continuous Schlossman, Erin, MD      . insulin regular (NOVOLIN R,HUMULIN R) 100 Units in sodium chloride 0.9 % 100 mL (1 Units/mL) infusion   Intravenous Continuous Schlossman, Erin, MD      . iopamidol (ISOVUE-370) 76 % injection           . sodium chloride 0.9 % bolus 1,000 mL  1,000 mL Intravenous Once Gareth Morgan, MD       Current Outpatient Prescriptions  Medication Sig Dispense Refill  . amphetamine-dextroamphetamine (ADDERALL) 30 MG tablet Take 1 tablet by mouth daily.  0  . blood glucose meter kit and supplies KIT Dispense based on patient and insurance preference. Use up to four times daily as directed. (FOR ICD-9 250.00, 250.01). (Patient not taking: Reported on 08/04/2017) 1 each 0  . collagenase (SANTYL) ointment Apply Santyl to left heel wound Q day, then cover with moist gauze and 4X4 and kerlex 15 g 0  . doxepin (SINEQUAN) 10 MG/ML solution Take 10 mLs by mouth at bedtime.  2  . glucose blood test strip Use as instructed (Patient not taking: Reported on 08/04/2017) 100 each 12  . insulin aspart (NOVOLOG FLEXPEN) 100 UNIT/ML FlexPen Check Blood Sugar 4 times per day > with meals and at bedtime CBG 70 -  120: 0 units CBG 121 - 150: 2 units CBG 151 - 200: 3 units CBG 201 - 250: 5 units CBG 251 - 300: 8 units CBG 301 - 350: 11 units CBG 351 - 400: 15 units 15 mL 11  . insulin aspart (NOVOLOG) 100 UNIT/ML injection Inject 5 Units into the skin 3 (three) times daily with meals. 10 mL 11  . insulin detemir (LEVEMIR) 100 UNIT/ML injection Inject 0.3 mLs (30 Units total) into the skin daily. 10 mL 11  . Insulin Pen Needle (PEN NEEDLES) 31G X 5 MM MISC 90 each by Does not apply route 4 (four) times daily -  before meals and at bedtime. (Patient not taking: Reported on 08/04/2017) 90 each 1  . Lancets (ACCU-CHEK MULTICLIX) lancets Use as directed (Patient not taking: Reported on 08/04/2017) 100 each 5  . metoCLOPramide (REGLAN) 10 MG tablet Take 1 tablet (10 mg total) by mouth 3 (three) times daily with meals. 90 tablet 1  . ondansetron (ZOFRAN ODT) 4 MG disintegrating tablet Take 1 tablet (4 mg total) by mouth every 8 (eight) hours as needed for nausea or vomiting. 20 tablet 0  . pantoprazole (PROTONIX) 40 MG tablet Take 1 tablet (40 mg total) by mouth daily. 30 tablet 0  . traMADol (ULTRAM) 50 MG tablet Take 1-2 tablets (50-100 mg total) by mouth every 6 (six) hours as needed for moderate pain or severe pain. (Patient not taking: Reported on 07/30/2017) 20 tablet 0  . traZODone (DESYREL) 150 MG tablet Take 150 mg by mouth at bedtime.  0     ROS:  History obtained from the patient  General ROS: negative for - chills, fatigue, fever, night sweats, weight gain or weight loss Psychological ROS: negative for - behavioral disorder, hallucinations, memory difficulties, mood swings or suicidal ideation Ophthalmic ROS: positive for - vision loss ENT ROS: negative for - epistaxis, nasal discharge, oral lesions, sore throat, tinnitus or vertigo Allergy and Immunology ROS: negative  for - hives or itchy/watery eyes Hematological and Lymphatic ROS: negative for - bleeding problems, bruising or swollen lymph nodes Endocrine ROS: negative for - galactorrhea, hair pattern changes, polydipsia/polyuria or temperature intolerance Respiratory ROS: negative for - cough, hemoptysis, shortness of breath or wheezing Cardiovascular ROS: negative for - chest pain, dyspnea on exertion, edema or irregular heartbeat Gastrointestinal ROS: negative for - abdominal pain, diarrhea, hematemesis, nausea/vomiting or stool incontinence Genito-Urinary ROS: negative for - dysuria, hematuria, incontinence or urinary frequency/urgency Musculoskeletal ROS: negative for - joint swelling or muscular weakness Neurological ROS: as noted in HPI Dermatological ROS: negative for rash and skin lesion changes  Neurologic Examination:                                                                                                      Blood pressure (!) 92/57, pulse 92, resp. rate 17, SpO2 100 %.  HEENT-  Normocephalic, no lesions, without obvious abnormality.  Normal external eye and conjunctiva.  Normal TM's bilaterally.  Normal auditory canals and external ears. Normal external nose, mucus membranes and septum.  Normal pharynx. Cardiovascular- S1, S2 normal, pulses palpable throughout   Lungs- chest clear, no wheezing, rales, normal symmetric air entry Abdomen- normal findings: bowel sounds normal Extremities- no edema Lymph-no adenopathy palpable Musculoskeletal-no joint tenderness, deformity or swelling Skin-warm and dry, no hyperpigmentation, vitiligo, or suspicious lesions  Neurological Examination Mental Status: Alert, oriented, but unable to give coherent history.  Speech dysarthric but he has very dry oral membranes. no evidence of aphasia.  Able to follow simple step commands without difficulty. Cranial Nerves: II: loss vision III,IV, VI: ptosis not present, extra-ocular motions intact  bilaterally, pupils equal 31m, round, non- reactive to light and accommodation V,VII: smile asymmetric on left, facial light touch sensation normal bilaterally VIII: hearing normal bilaterally IX,X: uvula rises symmetrically XI: bilateral shoulder shrug XII: midline tongue extension Motor: Right : Upper extremity   5/5    Left:     Upper extremity   5/5  Lower extremity   5/5     Lower extremity   5/5 Tone and bulk:normal tone throughout; no atrophy noted Sensory: Pinprick and light touch intact throughout, bilaterally Deep Tendon Reflexes: 1+ and symmetric throughout Plantars: Right: downgoing   Left: downgoing Cerebellar: Not tested Gait:not tested       Lab Results: Basic Metabolic Panel: No results for input(s): NA, K, CL, CO2, GLUCOSE, BUN, CREATININE, CALCIUM, MG, PHOS in the last 168 hours.  Liver Function Tests: No results for input(s): AST, ALT, ALKPHOS, BILITOT, PROT, ALBUMIN in the last 168 hours. No results for input(s): LIPASE, AMYLASE in the last 168 hours. No results for input(s): AMMONIA in the last 168 hours.  CBC: No results for input(s): WBC, NEUTROABS, HGB, HCT, MCV, PLT in the last 168 hours.  Cardiac Enzymes: No results for input(s): CKTOTAL, CKMB, CKMBINDEX, TROPONINI in the last 168 hours.  Lipid Panel: No results for input(s): CHOL, TRIG, HDL, CHOLHDL, VLDL, LDLCALC in the last 168 hours.  CBG:  Recent Labs Lab 08/17/17 0729  FFMBWG 665*    Microbiology: Results for orders placed or performed during the hospital encounter of 08/04/17  Blood culture (routine x 2)     Status: None   Collection Time: 08/04/17  3:40 PM  Result Value Ref Range Status   Specimen Description BLOOD RIGHT ANTECUBITAL  Final   Special Requests   Final    BOTTLES DRAWN AEROBIC AND ANAEROBIC Blood Culture adequate volume   Culture NO GROWTH 5 DAYS  Final   Report Status 08/09/2017 FINAL  Final  Blood culture (routine x 2)     Status: None   Collection Time:  08/04/17  3:45 PM  Result Value Ref Range Status   Specimen Description BLOOD LEFT ANTECUBITAL  Final   Special Requests   Final    BOTTLES DRAWN AEROBIC AND ANAEROBIC Blood Culture adequate volume   Culture NO GROWTH 5 DAYS  Final   Report Status 08/09/2017 FINAL  Final  Urine culture     Status: None   Collection Time: 08/04/17  5:55 PM  Result Value Ref Range Status   Specimen Description URINE, RANDOM  Final   Special Requests NONE  Final   Culture NO GROWTH  Final   Report Status 08/05/2017 FINAL  Final  Culture, respiratory (tracheal aspirate)     Status: None   Collection Time: 08/05/17 10:46 AM  Result Value Ref Range Status   Specimen Description TRACHEAL ASPIRATE  Final   Special Requests NONE  Final   Gram Stain   Final    ABUNDANT WBC PRESENT,BOTH PMN AND MONONUCLEAR RARE SQUAMOUS EPITHELIAL CELLS PRESENT RARE YEAST WITH PSEUDOHYPHAE RARE GRAM POSITIVE COCCI IN PAIRS RARE GRAM NEGATIVE COCCOBACILLI    Culture Consistent with normal respiratory flora.  Final   Report Status 08/07/2017 FINAL  Final    Coagulation Studies: No results for input(s): LABPROT, INR in the last 72 hours.  Imaging: No results found.     Assessment and plan discussed with with attending physician and they are in agreement.    Etta Quill PA-C [facility] [phone]  08/17/2017, 8:51 AM   Assessment: 31 y.o. male the history of diabetes who presents with DKA. He also has nonreactive pupils and vision change. Due to this, a stat CT Angie was obtained to rule out vertebral/basilar occlusion and his vessels  were without occlusion. Given his encephalopathy, I am uncertain about the etiology of his pupillary change, we will follow.  Stroke Risk Factors - hyperlipidemia and hypertension  1) observe with improvement of metabolic status 2) if persistent deficits, we can obtain MRI.  Roland Rack, MD [facility] [phone]  If 7pm- 7am, please page  neurology on call as listed in [place].

## 2017-08-17 NOTE — H&P (Signed)
PULMONARY / CRITICAL CARE MEDICINE   Name: [name] MRN: [id number] DOB: [birth year]    ADMISSION DATE:  08/17/2017  REFERRING MD:  Dr. Elmarie Mainland, ER  CHIEF COMPLAINT:  Altered mental status  HISTORY OF PRESENT ILLNESS:   31 yo male was in hospital 8/22 for DKA.  He didn't take his insulin.  Came in with hypothermia, hypotension, acidosis, and hyperglycemia.  He has wound on left heel.  He is not able to give history.  PAST MEDICAL HISTORY :  He  has a past medical history of Anemia; Anxiety; Arthritis; Chicken pox; Childhood asthma; Chronic pain; Depression; DKA (diabetic ketoacidoses) ([place]) (11/18/2014); DKA (diabetic ketoacidoses) ([place]) (07/05/2017); GERD (gastroesophageal reflux disease); Hypertension; Migraine; Noncompliance with medication regimen; Pneumonia; Polysubstance abuse; Schizo affective schizophrenia ([place]); Scoliosis; and Type I diabetes mellitus ([place]).  PAST SURGICAL HISTORY: He  has a past surgical history that includes Incision and drainage abscess (Left, 11/2011) and Cardiac catheterization (11/2016).  Allergies  Allergen Reactions  . Sulfa Antibiotics Other (See Comments)    Unknown childhood allergy    No current facility-administered medications on file prior to encounter.    Current Outpatient Prescriptions on File Prior to Encounter  Medication Sig  . amphetamine-dextroamphetamine (ADDERALL) 30 MG tablet Take 1 tablet by mouth daily.  . blood glucose meter kit and supplies KIT Dispense based on patient and insurance preference. Use up to four times daily as directed. (FOR ICD-9 250.00, 250.01). (Patient not taking: Reported on 08/04/2017)  . collagenase (SANTYL) ointment Apply Santyl to left heel wound Q day, then cover with moist gauze and 4X4 and kerlex  . doxepin (SINEQUAN) 10 MG/ML solution Take 10 mLs by mouth at bedtime.  [name] glucose blood test strip Use as instructed (Patient not taking: Reported on 08/04/2017)  . insulin aspart (NOVOLOG FLEXPEN)  100 UNIT/ML FlexPen Check Blood Sugar 4 times per day > with meals and at bedtime CBG 70 - 120: 0 units CBG 121 - 150: 2 units CBG 151 - 200: 3 units CBG 201 - 250: 5 units CBG 251 - 300: 8 units CBG 301 - 350: 11 units CBG 351 - 400: 15 units  . insulin aspart (NOVOLOG) 100 UNIT/ML injection Inject 5 Units into the skin 3 (three) times daily with meals.  . insulin detemir (LEVEMIR) 100 UNIT/ML injection Inject 0.3 mLs (30 Units total) into the skin daily.  . Insulin Pen Needle (PEN NEEDLES) 31G X 5 MM MISC 90 each by Does not apply route 4 (four) times daily -  before meals and at bedtime. (Patient not taking: Reported on 08/04/2017)  . Lancets (ACCU-CHEK MULTICLIX) lancets Use as directed (Patient not taking: Reported on 08/04/2017)  . metoCLOPramide (REGLAN) 10 MG tablet Take 1 tablet (10 mg total) by mouth 3 (three) times daily with meals.  . ondansetron (ZOFRAN ODT) 4 MG disintegrating tablet Take 1 tablet (4 mg total) by mouth every 8 (eight) hours as needed for nausea or vomiting.  . pantoprazole (PROTONIX) 40 MG tablet Take 1 tablet (40 mg total) by mouth daily.  . traMADol (ULTRAM) 50 MG tablet Take 1-2 tablets (50-100 mg total) by mouth every 6 (six) hours as needed for moderate pain or severe pain. (Patient not taking: Reported on 07/30/2017)  . traZODone (DESYREL) 150 MG tablet Take 150 mg by mouth at bedtime.  . [DISCONTINUED] citalopram (CELEXA) 20 MG tablet Take 20 mg by mouth daily.  . [DISCONTINUED] FLUoxetine (PROZAC) 20 MG tablet Take 20 mg by mouth every morning.  . [  DISCONTINUED] insulin NPH-regular Human (NOVOLIN 70/30) (70-30) 100 UNIT/ML injection Inject 40 Units into the skin 2 (two) times daily with a meal.  . [DISCONTINUED] OLANZapine zydis (ZYPREXA) 10 MG disintegrating tablet Take 10 mg by mouth 2 (two) times daily.  . [DISCONTINUED] sertraline (ZOLOFT) 50 MG tablet Take 50 mg by mouth daily. For depression. Just started med, have not picked up yet [facility] [address]     FAMILY HISTORY:  His indicated that his mother is alive.    SOCIAL HISTORY: He  reports that he has been smoking Cigarettes.  He has a 4.50 pack-year smoking history. He has never used smokeless tobacco. He reports that he uses drugs, including Marijuana, Cocaine, Methamphetamines, and "Crack" cocaine. He reports that he does not drink alcohol.  REVIEW OF SYSTEMS:   Unable to obtain   VITAL SIGNS: BP 109/76 (BP Location: Left Arm)   Pulse 74   Temp (!) 91.6 F (33.1 C) (Rectal)   Resp 18   SpO2 100%   INTAKE / OUTPUT: No intake/output data recorded.  PHYSICAL EXAMINATION: General:  somnolent Neuro:  Able to follow simple commands HEENT:  Dry mucosa Cardiovascular:  regular Lungs:  Rapid breathing, no wheeze Abdomen:  Soft, non tender Musculoskeletal:  No edema Skin:  Wound on Lt heel  LABS:  BMET  Recent Labs Lab 08/17/17 0931  NA 130*  K 4.5  CL 102  CO2 <7*  BUN 14  CREATININE 1.68*  GLUCOSE 553*    Electrolytes  Recent Labs Lab 08/17/17 0931  CALCIUM 8.4*    CBC  Recent Labs Lab 08/17/17 0931  WBC PENDING  HGB 10.7*  HCT 33.0*  PLT PENDING    Coag's No results for input(s): APTT, INR in the last 168 hours.  Sepsis Markers  Recent Labs Lab 08/17/17 0821  LATICACIDVEN 2.56*    ABG No results for input(s): PHART, PCO2ART, PO2ART in the last 168 hours.  Liver Enzymes  Recent Labs Lab 08/17/17 0931  AST 31  ALT 56  ALKPHOS 138*  BILITOT 1.9*  ALBUMIN 3.2*    Cardiac Enzymes No results for input(s): TROPONINI, PROBNP in the last 168 hours.  Glucose  Recent Labs Lab 08/17/17 0729 08/17/17 0910  GLUCAP 477* 463*    Imaging Ct Angio Head W Or Wo Contrast  Result Date: 08/17/2017 CLINICAL DATA:  Code stroke.  Complete visual loss.  Hyperglycemia. EXAM: CT ANGIOGRAPHY HEAD AND NECK TECHNIQUE: Multidetector CT imaging of the head and neck was performed using the standard protocol during bolus administration of  intravenous contrast. Multiplanar CT image reconstructions and MIPs were obtained to evaluate the vascular anatomy. Carotid stenosis measurements (when applicable) are obtained utilizing NASCET criteria, using the distal internal carotid diameter as the denominator. CONTRAST:  Dose currently not available.  Reference EMR. COMPARISON:  Head CT earlier today FINDINGS: CTA NECK FINDINGS Aortic arch: Normal.  Three vessel branching. Right carotid system: Smooth and widely patent. No atheromatous changes. Left carotid system: Smooth and widely patent. No atheromatous changes. Vertebral arteries: No proximal subclavian stenosis. Codominant vertebral arteries that are smooth and widely patent. Skeleton: No acute or aggressive finding. There is poor dentition with associated sclerosis of the alveolar ridge in both the mandible and maxilla. Other neck: No noted mass or inflammation. Upper chest: Negative Review of the MIP images confirms the above findings CTA HEAD FINDINGS Anterior circulation: Vessels are smooth and widely patent. Negative for aneurysm or beading. Posterior circulation: Codominant vertebral arteries. The vertebral and basilar  arteries are widely patent. No branch occlusion or stenosis noted. Negative for beading or aneurysm. Venous sinuses: Patent Anatomic variants: Incomplete circle-of-Willis at the right posterior communicating artery. Delayed phase: Not performed in the emergent setting. Review of the MIP images confirms the above findings IMPRESSION: Negative CTA of the head and neck. Electronically Signed   By: Monte Fantasia M.D.   On: 08/17/2017 09:12   Ct Angio Neck W Or Wo Contrast  Result Date: 08/17/2017 CLINICAL DATA:  Code stroke.  Complete visual loss.  Hyperglycemia. EXAM: CT ANGIOGRAPHY HEAD AND NECK TECHNIQUE: Multidetector CT imaging of the head and neck was performed using the standard protocol during bolus administration of intravenous contrast. Multiplanar CT image reconstructions  and MIPs were obtained to evaluate the vascular anatomy. Carotid stenosis measurements (when applicable) are obtained utilizing NASCET criteria, using the distal internal carotid diameter as the denominator. CONTRAST:  Dose currently not available.  Reference EMR. COMPARISON:  Head CT earlier today FINDINGS: CTA NECK FINDINGS Aortic arch: Normal.  Three vessel branching. Right carotid system: Smooth and widely patent. No atheromatous changes. Left carotid system: Smooth and widely patent. No atheromatous changes. Vertebral arteries: No proximal subclavian stenosis. Codominant vertebral arteries that are smooth and widely patent. Skeleton: No acute or aggressive finding. There is poor dentition with associated sclerosis of the alveolar ridge in both the mandible and maxilla. Other neck: No noted mass or inflammation. Upper chest: Negative Review of the MIP images confirms the above findings CTA HEAD FINDINGS Anterior circulation: Vessels are smooth and widely patent. Negative for aneurysm or beading. Posterior circulation: Codominant vertebral arteries. The vertebral and basilar arteries are widely patent. No branch occlusion or stenosis noted. Negative for beading or aneurysm. Venous sinuses: Patent Anatomic variants: Incomplete circle-of-Willis at the right posterior communicating artery. Delayed phase: Not performed in the emergent setting. Review of the MIP images confirms the above findings IMPRESSION: Negative CTA of the head and neck. Electronically Signed   By: Monte Fantasia M.D.   On: 08/17/2017 09:12   Ct Head Code Stroke Wo Contrast  Result Date: 08/17/2017 CLINICAL DATA:  Code stroke.  Complete visual loss. EXAM: CT HEAD WITHOUT CONTRAST TECHNIQUE: Contiguous axial images were obtained from the base of the skull through the vertex without intravenous contrast. COMPARISON:  08/04/2017 FINDINGS: Brain: No evidence of acute infarction, hemorrhage, hydrocephalus, extra-axial collection or mass  lesion/mass effect. Vascular: No hyperdense vessel or unexpected calcification. Skull: Normal. Negative for fracture or focal lesion. Sinuses/Orbits: No acute finding. Other: Text page with results sent on 08/17/2017 at 8:52 am to Dr. Leonel Ramsay. ASPECTS [facility] Stroke Program Early CT Score) Not scored with this symptomatology. IMPRESSION: Negative head CT. Electronically Signed   By: Monte Fantasia M.D.   On: 08/17/2017 08:53     STUDIES:  CT head 8/31 >> negative  CULTURES: Blood 8/31 >>   ANTIBIOTICS: Vancomycin 8/31 >> Zosyn 8/31 >>  SIGNIFICANT EVENTS: 8/31 Admit  LINES/TUBES:  ASSESSMENT / PLAN:,   DKA. - IV fluids - insulin gtt until anion gap closed - f/u electrolytes - change to D5 1/2 NS with CBG < 250  Lactic acidosis with concern for sepsis from Lt foot wound. - foot xray - wound care - vancomycin, zoysn - f/u lactic acid  Hypothermia. - likely from DKA and possible sepsis - warming blanket - check TSH, cortisol  Nausea with vomiting. - NPO - prn zofran  Acute renal failure. - likely prerenal from volume depletion - IV fluids  Anemia of critical  illness. - f/u CBC  Elevated bilirubin. - Abd exam benign - f/u LFT  Acute metabolic encephalopathy. - monitor mental status  DVT prophylaxis - SQ heparin SUP - protonix Nutrition - NPO Goals of care - full code  CC time 31 minutes  Chesley Mires, MD [place] 08/17/2017, 10:36 AM Pager:  [phone] After 3pm call: [phone]

## 2017-08-17 NOTE — ED Notes (Signed)
MD at bedside,

## 2017-08-17 NOTE — Progress Notes (Signed)
Pharmacy Antibiotic Note  [name] is a 31 y.o. male admitted on 08/17/2017 with cellulitis.  Pharmacy has been consulted for vancomycin and zosyn dosing. Pt is hypothermic, WBC is pending. Scr is elevated at 1.68 and lactic acid is elevated.   Plan: Vancomycin 1gm IV Q12H Zosyn 3.375gm IV Q8H (4 hr inf) F/u renal fxn, C&S, clinical status and trough at SS     Temp (24hrs), Avg:91.6 F (33.1 C), Min:91.6 F (33.1 C), Max:91.6 F (33.1 C)   Recent Labs Lab 08/17/17 0821 08/17/17 0931  WBC  --  PENDING  CREATININE  --  1.68*  LATICACIDVEN 2.56*  --     Estimated Creatinine Clearance: 73.4 mL/min (A) (by C-G formula based on SCr of 1.68 mg/dL (H)).    Allergies  Allergen Reactions  . Sulfa Antibiotics Other (See Comments)    Unknown childhood allergy    Antimicrobials this admission: Vanc 8/31>> Zosyn 8/31>>  Dose adjustments this admission: N/A  Microbiology results: Pending  Thank you for allowing pharmacy to be a part of this patient's care.  Krishika Bugge, Drake Leach 08/17/2017 10:31 AM

## 2017-08-17 NOTE — ED Notes (Addendum)
Patient in CT

## 2017-08-17 NOTE — ED Notes (Signed)
Neuro at bedside.

## 2017-08-17 NOTE — ED Notes (Signed)
Patient refused to allow RN to start IV or collect blood work. RN advised It was needed. Patient crossed arms and stated 'No"

## 2017-08-17 NOTE — ED Notes (Signed)
Ultrasound at bedside for EDP. Patient still refuses RN To start IV and collect blood

## 2017-08-18 DIAGNOSIS — H53133 Sudden visual loss, bilateral: Secondary | ICD-10-CM

## 2017-08-18 DIAGNOSIS — F259 Schizoaffective disorder, unspecified: Secondary | ICD-10-CM | POA: Diagnosis present

## 2017-08-18 DIAGNOSIS — E1039 Type 1 diabetes mellitus with other diabetic ophthalmic complication: Secondary | ICD-10-CM | POA: Diagnosis present

## 2017-08-18 DIAGNOSIS — Z79899 Other long term (current) drug therapy: Secondary | ICD-10-CM | POA: Diagnosis not present

## 2017-08-18 DIAGNOSIS — Z794 Long term (current) use of insulin: Secondary | ICD-10-CM | POA: Diagnosis not present

## 2017-08-18 DIAGNOSIS — G8929 Other chronic pain: Secondary | ICD-10-CM | POA: Diagnosis present

## 2017-08-18 DIAGNOSIS — E785 Hyperlipidemia, unspecified: Secondary | ICD-10-CM | POA: Diagnosis present

## 2017-08-18 DIAGNOSIS — H547 Unspecified visual loss: Secondary | ICD-10-CM | POA: Diagnosis present

## 2017-08-18 DIAGNOSIS — E1010 Type 1 diabetes mellitus with ketoacidosis without coma: Secondary | ICD-10-CM | POA: Diagnosis not present

## 2017-08-18 DIAGNOSIS — R17 Unspecified jaundice: Secondary | ICD-10-CM | POA: Diagnosis present

## 2017-08-18 DIAGNOSIS — I10 Essential (primary) hypertension: Secondary | ICD-10-CM | POA: Diagnosis present

## 2017-08-18 DIAGNOSIS — D6489 Other specified anemias: Secondary | ICD-10-CM | POA: Diagnosis present

## 2017-08-18 DIAGNOSIS — F17210 Nicotine dependence, cigarettes, uncomplicated: Secondary | ICD-10-CM | POA: Diagnosis present

## 2017-08-18 DIAGNOSIS — G9341 Metabolic encephalopathy: Secondary | ICD-10-CM | POA: Diagnosis present

## 2017-08-18 DIAGNOSIS — Z882 Allergy status to sulfonamides status: Secondary | ICD-10-CM | POA: Diagnosis not present

## 2017-08-18 DIAGNOSIS — F329 Major depressive disorder, single episode, unspecified: Secondary | ICD-10-CM | POA: Diagnosis present

## 2017-08-18 DIAGNOSIS — K219 Gastro-esophageal reflux disease without esophagitis: Secondary | ICD-10-CM | POA: Diagnosis present

## 2017-08-18 DIAGNOSIS — E876 Hypokalemia: Secondary | ICD-10-CM | POA: Diagnosis not present

## 2017-08-18 DIAGNOSIS — F419 Anxiety disorder, unspecified: Secondary | ICD-10-CM | POA: Diagnosis present

## 2017-08-18 DIAGNOSIS — J45909 Unspecified asthma, uncomplicated: Secondary | ICD-10-CM | POA: Diagnosis present

## 2017-08-18 DIAGNOSIS — M419 Scoliosis, unspecified: Secondary | ICD-10-CM | POA: Diagnosis present

## 2017-08-18 DIAGNOSIS — E869 Volume depletion, unspecified: Secondary | ICD-10-CM | POA: Diagnosis present

## 2017-08-18 DIAGNOSIS — L97529 Non-pressure chronic ulcer of other part of left foot with unspecified severity: Secondary | ICD-10-CM | POA: Diagnosis present

## 2017-08-18 DIAGNOSIS — L0390 Cellulitis, unspecified: Secondary | ICD-10-CM

## 2017-08-18 DIAGNOSIS — L03116 Cellulitis of left lower limb: Secondary | ICD-10-CM | POA: Diagnosis not present

## 2017-08-18 DIAGNOSIS — E10621 Type 1 diabetes mellitus with foot ulcer: Secondary | ICD-10-CM | POA: Diagnosis present

## 2017-08-18 DIAGNOSIS — N179 Acute kidney failure, unspecified: Secondary | ICD-10-CM | POA: Diagnosis not present

## 2017-08-18 LAB — BASIC METABOLIC PANEL
ANION GAP: 10 (ref 5–15)
Anion gap: 6 (ref 5–15)
Anion gap: 9 (ref 5–15)
BUN: 10 mg/dL (ref 6–20)
BUN: 10 mg/dL (ref 6–20)
BUN: 11 mg/dL (ref 6–20)
CALCIUM: 8.3 mg/dL — AB (ref 8.9–10.3)
CALCIUM: 8.5 mg/dL — AB (ref 8.9–10.3)
CALCIUM: 8.3 mg/dL — AB (ref 8.9–10.3)
CO2: 15 mmol/L — ABNORMAL LOW (ref 22–32)
CO2: 13 mmol/L — ABNORMAL LOW (ref 22–32)
CO2: 16 mmol/L — AB (ref 22–32)
CREATININE: 1.09 mg/dL (ref 0.61–1.24)
CREATININE: 1.13 mg/dL (ref 0.61–1.24)
Chloride: 115 mmol/L — ABNORMAL HIGH (ref 101–111)
Chloride: 116 mmol/L — ABNORMAL HIGH (ref 101–111)
Chloride: 116 mmol/L — ABNORMAL HIGH (ref 101–111)
Creatinine, Ser: 1.07 mg/dL (ref 0.61–1.24)
GFR calc Af Amer: >60 mL/min (ref >60)
GFR calc non Af Amer: >60 mL/min (ref >60)
GLUCOSE: 186 mg/dL — AB (ref 65–99)
GLUCOSE: 128 mg/dL — AB (ref 65–99)
GLUCOSE: 160 mg/dL — AB (ref 65–99)
Potassium: 2.8 mmol/L — ABNORMAL LOW (ref 3.5–5.1)
Potassium: 3.1 mmol/L — ABNORMAL LOW (ref 3.5–5.1)
Potassium: 3.1 mmol/L — ABNORMAL LOW (ref 3.5–5.1)
Sodium: 137 mmol/L (ref 135–145)
Sodium: 139 mmol/L (ref 135–145)
Sodium: 140 mmol/L (ref 135–145)

## 2017-08-18 LAB — GLUCOSE, CAPILLARY
GLUCOSE-CAPILLARY: 292 mg/dL — AB (ref 65–99)
GLUCOSE-CAPILLARY: 318 mg/dL — AB (ref 65–99)
GLUCOSE-CAPILLARY: 137 mg/dL — AB (ref 65–99)
Glucose-Capillary: 119 mg/dL — ABNORMAL HIGH (ref 65–99)
Glucose-Capillary: 134 mg/dL — ABNORMAL HIGH (ref 65–99)
Glucose-Capillary: 146 mg/dL — ABNORMAL HIGH (ref 65–99)
Glucose-Capillary: 157 mg/dL — ABNORMAL HIGH (ref 65–99)
Glucose-Capillary: 162 mg/dL — ABNORMAL HIGH (ref 65–99)

## 2017-08-18 MED ORDER — POTASSIUM CHLORIDE CRYS ER 20 MEQ PO TBCR: 40.0000 meq | EXTENDED_RELEASE_TABLET | Freq: Once | ORAL | Status: DC

## 2017-08-18 MED ORDER — INSULIN ASPART 100 UNIT/ML ~~LOC~~ SOLN
0.0000 [IU] | Freq: Every day | SUBCUTANEOUS | Status: DC
  Administered 2017-08-19: 2 [IU] via SUBCUTANEOUS

## 2017-08-18 MED ORDER — SODIUM CHLORIDE 0.9 % IV SOLN
INTRAVENOUS | Status: DC
  Administered 2017-08-18: 02:00:00 via INTRAVENOUS

## 2017-08-18 MED ORDER — INSULIN GLARGINE 100 UNIT/ML ~~LOC~~ SOLN
40.0000 [IU] | SUBCUTANEOUS | Status: DC
  Administered 2017-08-18 – 2017-08-19 (×2): 40 [IU] via SUBCUTANEOUS
  Filled 2017-08-18 (×2): qty 0.4

## 2017-08-18 MED ORDER — LACTATED RINGERS IV SOLN
INTRAVENOUS | Status: DC
  Administered 2017-08-18 – 2017-08-19 (×2): via INTRAVENOUS

## 2017-08-18 MED ORDER — POTASSIUM CHLORIDE 20 MEQ/15ML (10%) PO SOLN: 40.0000 meq | Freq: Once | ORAL | Status: AC

## 2017-08-18 MED ORDER — CALCIUM CARBONATE ANTACID 500 MG PO CHEW
1.0000 | CHEWABLE_TABLET | Freq: Three times a day (TID) | ORAL | Status: DC | PRN
Start: 2017-08-18 — End: 2017-08-20
  Administered 2017-08-18 – 2017-08-20 (×3): 200 mg via ORAL
  Filled 2017-08-18 (×3): qty 1

## 2017-08-18 MED ORDER — INSULIN ASPART 100 UNIT/ML ~~LOC~~ SOLN
0.0000 [IU] | Freq: Three times a day (TID) | SUBCUTANEOUS | Status: DC
Start: 2017-08-18 — End: 2017-08-20
  Administered 2017-08-18: 5 [IU] via SUBCUTANEOUS
  Administered 2017-08-18: 7 [IU] via SUBCUTANEOUS
  Administered 2017-08-19: 2 [IU] via SUBCUTANEOUS
  Administered 2017-08-19: 3 [IU] via SUBCUTANEOUS
  Administered 2017-08-20: 5 [IU] via SUBCUTANEOUS

## 2017-08-18 MED ORDER — TRAMADOL HCL 50 MG PO TABS
50.0000 mg | ORAL_TABLET | Freq: Four times a day (QID) | ORAL | Status: DC | PRN
  Administered 2017-08-18 – 2017-08-19 (×6): 50 mg via ORAL
  Filled 2017-08-18 (×6): qty 1

## 2017-08-18 MED ORDER — PANTOPRAZOLE SODIUM 40 MG PO TBEC
40.0000 mg | DELAYED_RELEASE_TABLET | Freq: Two times a day (BID) | ORAL | Status: DC
  Administered 2017-08-18 – 2017-08-20 (×4): 40 mg via ORAL
  Filled 2017-08-18 (×4): qty 1

## 2017-08-18 MED ORDER — METOCLOPRAMIDE HCL 10 MG PO TABS
10.0000 mg | ORAL_TABLET | Freq: Three times a day (TID) | ORAL | Status: DC
  Administered 2017-08-18 – 2017-08-19 (×4): 10 mg via ORAL
  Filled 2017-08-18 (×5): qty 1

## 2017-08-18 MED ORDER — METOCLOPRAMIDE HCL 5 MG/5ML PO SOLN: 10.0000 mg | Freq: Three times a day (TID) | ORAL | Status: DC

## 2017-08-18 MED ORDER — DEXTROSE 10 % IV SOLN: INTRAVENOUS | Status: DC | PRN

## 2017-08-18 MED ORDER — POTASSIUM CHLORIDE 20 MEQ/15ML (10%) PO SOLN
ORAL | Status: AC
  Administered 2017-08-18: 40 meq
  Filled 2017-08-18: qty 30

## 2017-08-18 MED ORDER — INSULIN ASPART 100 UNIT/ML ~~LOC~~ SOLN
5.0000 [IU] | Freq: Three times a day (TID) | SUBCUTANEOUS | Status: DC
  Administered 2017-08-18 – 2017-08-19 (×3): 5 [IU] via SUBCUTANEOUS

## 2017-08-18 NOTE — Consult Note (Signed)
WOC Nurse wound consult note Reason for Consult:Blood filled blister to left dorsal foot.  States origin is prolonged pressure against furniture.  Not tender.  Wound type: Pressure and trauma Pressure Injury POA: Yes Measurement: 2 cm x 2 cm x 0.2 cm sloughing epithelium from blood filled blister Wound bed: 50% scabbed and 50% pink and moist Drainage (amount, consistency, odor) minimal serosanguinous  No odor  Periwound:intact Dressing procedure/placement/frequency:Cleanse wound to left dorsal foot with NS. Apply nonadherent silicone foam dressing.  Change daily.  Will not follow at this time.  Please re-consult if needed.  Maple Hudson RN BSN CWON Pager [phone]

## 2017-08-18 NOTE — Progress Notes (Signed)
eLink Physician-Brief Progress Note Patient Name: [name] DOB: January 23, 1986 MRN: [id number]   Date of Service  08/18/2017  HPI/Events of Note  AG resolved  eICU Interventions  Dc dextrose Once Gtt down to 4u/H , transition to phase3 with 2h overlap after 40u lantus  resume home tramadol for pain & advance diet     Intervention Category Major Interventions: Hyperglycemia - active titration of insulin therapy  ALVA,RAKESH V. 08/18/2017, 1:29 AM

## 2017-08-18 NOTE — Progress Notes (Signed)
Subjective: Much improved vision.   Exam: Vitals:   08/18/17 0600 08/18/17 0737  BP: 116/80   Pulse: (!) 116   Resp: 13   Temp:  98.2 F (36.8 C)  SpO2: 100%    Gen: In bed, NAD Resp: non-labored breathing, no acute distress Abd: soft, nt  Neuro: MS: awake, alert, interactive and appropriate.  QM:GNOIB, VFF Motor: MAEW  Impression: 31 yo M with vision change and fixed pupils(resolved) in the setting of severe metabolic disturbance. I suspect that he had a toxic/metabolic optic nerve dysfunction and his improvement is reassuring.   Recommendations: 1)Coudl follow up with ophthalmology as outpatient 2) Neurology will sign off.   Ritta Slot, MD [facility] [phone]  If 7pm- 7am, please page neurology on call as listed in [facility].

## 2017-08-18 NOTE — Progress Notes (Addendum)
PULMONARY / CRITICAL CARE MEDICINE   Name: [name] MRN: [id number] DOB: [birth year]    ADMISSION DATE:  08/17/2017  REFERRING MD:  Dr. Merlinda Frederick, ER  CHIEF COMPLAINT:  Altered mental status  HISTORY OF PRESENT ILLNESS:   31 yo male was in hospital 8/22 for DKA.  He didn't take his insulin.  Came in with hypothermia, hypotension, acidosis, and hyperglycemia.  He has wound on left heel.  He is not able to give history.  Subjective No distress.   VITAL SIGNS: BP 116/80   Pulse (!) 116   Temp 98.2 F (36.8 C) (Oral)   Resp 13   Ht 6\' 2"  (1.88 m)   Wt 196 lb 3.4 oz (89 kg)   SpO2 100%   BMI 25.19 kg/m   INTAKE / OUTPUT: I/O last 3 completed shifts: In: 1419.6 [I.V.:1319.6; IV Piggyback:100] Out: 1300 [Urine:1300]  PHYSICAL EXAMINATION: General appearance: chronically ill appearing 31  Year old  Male NAD, currently in acute distress,conversant  Eyes: anicteric sclerae moist conjunctivae; PERRL, EOMI bilaterally. Mouth:  membranes and no mucosal ulcerations; normal hard and soft palate, very poor dentition  Neck: Trachea midline; neck supple, no JVD Lungs/chest: CTA, with normal respiratory effort and no intercostal retractions CV: RRR, no MRGs  Abdomen: Soft, non-tender; no masses or HSM Extremities: No peripheral edema or extremity lymphadenopathy. Large left unstageable heal ulcer  Psych: Appropriate affect, alert and oriented to person, place and time LABS:  BMET  Recent Labs Lab 08/17/17 2331 08/18/17 0319 08/18/17 0752  NA 137 140 139  K 2.8* 3.1* 3.1*  CL 115* 116* 116*  CO2 16* 15* 13*  BUN 10 11 10   CREATININE 1.09 1.07 1.13  GLUCOSE 186* 128* 160*    Electrolytes  Recent Labs Lab 08/17/17 1023  08/17/17 2331 08/18/17 0319 08/18/17 0752  CALCIUM 8.7*  < > 8.3* 8.5* 8.3*  MG 1.7  --   --   --   --   PHOS 4.2  --   --   --   --   < > = values in this interval not displayed.  CBC  Recent Labs Lab 08/17/17 0931 08/17/17 1023  WBC  26.1* 33.6*  HGB 10.7* 12.5*  HCT 33.0* 37.7*  PLT 676* 701*    Coag's No results for input(s): APTT, INR in the last 168 hours.  Sepsis Markers  Recent Labs Lab 08/17/17 0821 08/17/17 1128  LATICACIDVEN 2.56* 1.7    ABG No results for input(s): PHART, PCO2ART, PO2ART in the last 168 hours.  Liver Enzymes  Recent Labs Lab 08/17/17 0931 08/17/17 1038  AST 31 24  ALT 56 60  ALKPHOS 138* 159*  BILITOT 1.9* 1.8*  ALBUMIN 3.2* 3.4*    Cardiac Enzymes No results for input(s): TROPONINI, PROBNP in the last 168 hours.  Glucose  Recent Labs Lab 08/17/17 2251 08/18/17 0006 08/18/17 0101 08/18/17 0159 08/18/17 0445 08/18/17 0738  GLUCAP 154* 162* 157* 134* 119* 146*    Imaging Dg Foot 2 Views Left  Result Date: 08/17/2017 CLINICAL DATA:  Diabetic with heel ulcer. EXAM: LEFT FOOT - 2 VIEW COMPARISON:  Radiographs 07/30/2017 and 07/06/2017. FINDINGS: There is apparent bandage material over the plantar aspect of the hindfoot and midfoot. Mild adjacent soft tissue irregularity is suggested on the lateral view, consistent with an ulcer. No definite soft tissue emphysema or bone destruction. There is no evidence of acute fracture or dislocation. The joint spaces appear maintained. IMPRESSION: No osseous abnormality demonstrated. Plantar hindfoot  soft tissue irregularity consistent with an ulcer. Electronically Signed   By: Carey Bullocks M.D.   On: 08/17/2017 11:47     STUDIES:  CT head 8/31 >> negative  CULTURES: Blood 8/31 >> GPC 1/2>>>  ANTIBIOTICS: Vancomycin 8/31 >> Zosyn 8/31 >>  SIGNIFICANT EVENTS: 8/31 Admit  LINES/TUBES:  ASSESSMENT / PLAN:,   DKA-->AG closed last night now on [facility] Plan ssi ac/hs  Severe sepsis w/ GPC bacteremia (1/2)? Is this a contamination?? Has Left foot wound -> LA cleared Left foot film->No osseous abnormality demonstrated. Plantar hindfoot soft tissue irregularity consistent with an ulcer Plan Cont vanc and zosyn  started 8/31 F/u cultures  Narrow as indicated  Nausea with vomiting. Plan - prn zofran  Acute renal failure NAGMA in setting of hyperchloremia  Hypokalemia  - likely prerenal from volume depletion & now resolved Plan Cont gentle IVFs w/ LR Replace K F/u  am chem    Anemia of critical illness.-->no evidence of bleeding Plan Trend cbc  Elevated bilirubin. - Abd exam benign Plan Trend   Acute metabolic encephalopathy-->resolved Plan Supportive care  DVT prophylaxis - SQ heparin SUP - protonix Nutrition - carb mod Goals of care - full code  Simonne Martinet ACNP-BC [facility] Pulmonary/Critical Care Pager # [phone] OR # [phone] if no answer  Attending:  I have seen and examined the patient with nurse practitioner/resident and agree with the note above.  We formulated the plan together and I elicited the following history.    Admitted for DKA Still tachycardic but feels a bit better Appetite poor neck neck none exam: Awake alert, conversant excellent belly soft, nontender Cardiovascular tachycardic Dermatologic, left heel wound reviewed with overlying desquamation, some surrounding cellulitis, some purulence noted  DKA: Improved, continue subcutaneous insulin, carb modified diet, monitor glucoses Left heel wound, continue antibiotics, wound care consults a day  Moved to MedSurg floor, [facility] hospitalist to take over  Heber [place], MD [place] [facility] Pager: [phone] Cell: [phone] After 3pm or if no response, call [phone]

## 2017-08-18 NOTE — Progress Notes (Signed)
.  PHARMACY - PHYSICIAN COMMUNICATION CRITICAL VALUE ALERT - BLOOD CULTURE IDENTIFICATION (BCID)  Blood culture 8/30 x 2 >> 1/2 gram positive cocci BCID PCR invalid. Awaiting culture results.  Patient currently on broad spectrum antibiotics vancomycin and zosyn.  Changes to prescribed antibiotics required: none  Al Corpus, [name] PGY1 [facility] Resident Pager: [phone] After 3:00PM please call Main Pharmacy [phone] 08/18/2017  8:08 AM

## 2017-08-19 ENCOUNTER — Encounter (HOSPITAL_COMMUNITY): Payer: Self-pay

## 2017-08-19 DIAGNOSIS — L03116 Cellulitis of left lower limb: Secondary | ICD-10-CM

## 2017-08-19 DIAGNOSIS — E1010 Type 1 diabetes mellitus with ketoacidosis without coma: Principal | ICD-10-CM

## 2017-08-19 LAB — GLUCOSE, CAPILLARY
GLUCOSE-CAPILLARY: 232 mg/dL — AB (ref 65–99)
GLUCOSE-CAPILLARY: 92 mg/dL (ref 65–99)
GLUCOSE-CAPILLARY: 156 mg/dL — AB (ref 65–99)
GLUCOSE-CAPILLARY: 228 mg/dL — AB (ref 65–99)
Glucose-Capillary: 163 mg/dL — ABNORMAL HIGH (ref 65–99)
Glucose-Capillary: 287 mg/dL — ABNORMAL HIGH (ref 65–99)
Glucose-Capillary: 66 mg/dL (ref 65–99)

## 2017-08-19 MED ORDER — DOXEPIN HCL 10 MG PO CAPS
10.0000 mg | ORAL_CAPSULE | Freq: Every day | ORAL | Status: DC
  Administered 2017-08-19: 10 mg via ORAL
  Filled 2017-08-19 (×2): qty 1

## 2017-08-19 MED ORDER — ACETAMINOPHEN 325 MG PO TABS
650.0000 mg | ORAL_TABLET | Freq: Four times a day (QID) | ORAL | Status: DC | PRN
  Administered 2017-08-19 – 2017-08-20 (×2): 650 mg via ORAL
  Filled 2017-08-19 (×2): qty 2

## 2017-08-19 MED ORDER — AMPHETAMINE-DEXTROAMPHETAMINE 20 MG PO TABS: 30.0000 mg | ORAL_TABLET | Freq: Every day | ORAL | Status: DC

## 2017-08-19 MED ORDER — INSULIN DETEMIR 100 UNIT/ML ~~LOC~~ SOLN
8.0000 [IU] | Freq: Every day | SUBCUTANEOUS | Status: DC
  Administered 2017-08-19 – 2017-08-20 (×2): 8 [IU] via SUBCUTANEOUS
  Filled 2017-08-19 (×3): qty 0.08

## 2017-08-19 MED ORDER — TRAZODONE HCL 150 MG PO TABS
150.0000 mg | ORAL_TABLET | Freq: Every day | ORAL | Status: DC
  Administered 2017-08-19: 150 mg via ORAL
  Filled 2017-08-19: qty 1

## 2017-08-19 MED ORDER — POTASSIUM CHLORIDE 20 MEQ PO PACK
40.0000 meq | PACK | ORAL | Status: AC
  Administered 2017-08-19: 40 meq via ORAL
  Filled 2017-08-19 (×2): qty 2

## 2017-08-19 NOTE — Progress Notes (Signed)
PROGRESS NOTE    [name]  MPN:[id number] DOB: [birth year] DOA: 08/17/2017 PCP: Fleet Contras, MD    Brief Narrative:  31 year old male who presented with altered mentation. Patient is known to have type 1 diabetes mellitus, hypertension, polysubstance abuse, schizoaffective disorder/ schizophrenia, gastroesophageal reflux and polysubstance abuse. Apparently he has been noncompliant with his insulin therapy, at arrival patient was somnolent unable to give any history. On initial physical examination blood pressure 109/76, heart rate 74, respiratory rate 18, oxygen saturation 100%, temperature 91.6. He was somnolent, dry mucous membranes, lungs are clear to auscultation bilaterally, heart S1-S2 present rhythmic, his abdomen was soft nontender, he had a left heel wound. Sodium 130, potassium 4.5, chloride 102, bicarbonate less than 7, glucose 553, BUN 14, creatinine 1.68, anion gap at least 21, white count 26.1, hemoglobin 10.7, hematocrit 33.0, platelets 676. Urinalysis with greater than 500 glucose, specific gravity 1.016, 30 protein. Urine drug screen negative, head CT-CTA negative, left foot x-ray negative for bony involvement, EKG normal sinus rhythm, rate 92 bpm, left axis deviation, normal intervals.  Patient was admitted to the intensive care unit with the working diagnosis of diabetes ketoacidosis.  Assessment & Plan:   Active Problems:   DKA, type 1 ([name])   Cellulitis   1. Diabetes ketoacidosis with metabolic encephalopathy. Resolved diabetic acidosis, closed anion gap, will continue glucose control. Patient is tolerating po well, no nausea or vomiting. Correct electrolytes, will hold on further IV fluids.   2. Type 1 diabetes mellitus. Will resume basal insulin with insulin levimir at 8 units, patient needs basal insulin to prevent rebound DKA. Will continue insulin sliding scale for glucose cover and monitoring.   3. Acute kidney injury with hypokalemia and non gap  hyperchloremic metabolic acidosis. Serum cr at 1,13 with K at 3,1 and serum bicarbonate at 13, will hold on IV saline infusion and will follow on renal panel in am. Patient tolerating po well. Continue k repletion with kcl.   4. Left foot ulcer. Stage 3 large ulcerated wound with purulent drainage, no edema or erythema, will continue to follow up wound care team recommendations and will check MRI to assess any bony involvement. For now will continue with broad spectrum IV antibiotic therapy with Vancomycin and Zosyn.   5. Schizophrenia. Will resume doxepin, and trazodone. Patient on adderall.    DVT prophylaxis: enoxaparin   Code Status: full Family Communication: no family at the bedside  Disposition Plan: Home   Consultants:     Procedures:     Antimicrobials:   Vancomycin  Zosyn   Subjective: Patient with no significant pain on left foot, no nausea or vomiting, no chest pain or dyspnea.   Objective: Vitals:   08/18/17 1130 08/18/17 1427 08/18/17 2013 08/19/17 0413  BP:  123/71 127/76 124/76  Pulse:  (!) 118 (!) 105 100  Resp:  20 20 20   Temp: 98.6 F (37 C) 98 F (36.7 C) 98.5 F (36.9 C) 98.6 F (37 C)  TempSrc: Oral Oral Oral Oral  SpO2:  100% 100% 100%  Weight:      Height:        Intake/Output Summary (Last 24 hours) at 08/19/17 1111 Last data filed at 08/19/17 0414  Gross per 24 hour  Intake          1536.67 ml  Output                0 ml  Net          1536.67  ml   Filed Weights   08/17/17 1715  Weight: 89 kg (196 lb 3.4 oz)    Examination:  General: Not in pain or dyspnea Neurology: Awake and alert, non focal  E ENT: no pallor, no icterus, oral mucosa moist Cardiovascular: S1-S2 present, rhythmic, no gallops, rubs, or murmurs. No jugular venous distention, no lower extremity edema. Pulmonary: vesicular breath sounds bilaterally, adequate air movement, no wheezing, rhonchi or rales. Gastrointestinal. Abdomen flat, no organomegaly, non tender,  no rebound or guarding Skin. No rashes Musculoskeletal: no joint deformities     Data Reviewed: I have personally reviewed following labs and imaging studies  CBC:  Recent Labs Lab 08/17/17 0931 08/17/17 1023  WBC 26.1* 33.6*  NEUTROABS 23.5*  --   HGB 10.7* 12.5*  HCT 33.0* 37.7*  MCV 88.7 86.7  PLT 676* 701*   Basic Metabolic Panel:  Recent Labs Lab 08/17/17 1023 08/17/17 1430 08/17/17 1934 08/17/17 2331 08/18/17 0319 08/18/17 0752  NA 134* 135 138 137 140 139  K 4.1 4.0 3.1* 2.8* 3.1* 3.1*  CL 106 111 114* 115* 116* 116*  CO2 <7* <7* 11* 16* 15* 13*  GLUCOSE 452* 368* 193* 186* 128* 160*  BUN 12 15 13 10 11 10   CREATININE 1.57* 1.64* 1.36* 1.09 1.07 1.13  CALCIUM 8.7* 8.5* 8.5* 8.3* 8.5* 8.3*  MG 1.7  --   --   --   --   --   PHOS 4.2  --   --   --   --   --    GFR: Estimated Creatinine Clearance: 109.1 mL/min (by C-G formula based on SCr of 1.13 mg/dL). Liver Function Tests:  Recent Labs Lab 08/17/17 0931 08/17/17 1038  AST 31 24  ALT 56 60  ALKPHOS 138* 159*  BILITOT 1.9* 1.8*  PROT 5.9* 6.6  ALBUMIN 3.2* 3.4*   No results for input(s): LIPASE, AMYLASE in the last 168 hours. No results for input(s): AMMONIA in the last 168 hours. Coagulation Profile: No results for input(s): INR, PROTIME in the last 168 hours. Cardiac Enzymes: No results for input(s): CKTOTAL, CKMB, CKMBINDEX, TROPONINI in the last 168 hours. BNP (last 3 results) No results for input(s): PROBNP in the last 8760 hours. HbA1C: No results for input(s): HGBA1C in the last 72 hours. CBG:  Recent Labs Lab 08/18/17 1556 08/18/17 2011 08/19/17 0008 08/19/17 0411 08/19/17 0706  GLUCAP 318* 137* 287* 232* 163*   Lipid Profile: No results for input(s): CHOL, HDL, LDLCALC, TRIG, CHOLHDL, LDLDIRECT in the last 72 hours. Thyroid Function Tests:  Recent Labs  08/17/17 1048  TSH 0.365   Anemia Panel: No results for input(s): VITAMINB12, FOLATE, FERRITIN, TIBC, IRON,  RETICCTPCT in the last 72 hours.    Radiology Studies: I have reviewed all of the imaging during this hospital visit personally     Scheduled Meds: . heparin  5,000 Units Subcutaneous Q8H  . insulin aspart  0-5 Units Subcutaneous QHS  . insulin aspart  0-9 Units Subcutaneous TID WC  . insulin aspart  5 Units Subcutaneous TID WC  . insulin glargine  40 Units Subcutaneous Q24H  . metoCLOPramide  10 mg Oral TID AC  . pantoprazole  40 mg Oral BID   Continuous Infusions: . dextrose    . insulin (NOVOLIN-R) infusion Stopped (08/18/17 0248)  . lactated ringers 100 mL/hr at 08/19/17 0046  . piperacillin-tazobactam    . piperacillin-tazobactam (ZOSYN)  IV 3.375 g (08/19/17 0828)  . vancomycin Stopped (08/19/17 1048)  LOS: 1 day       Coralie Keens, MD [facility] Pager [phone]

## 2017-08-20 LAB — CBC WITH DIFFERENTIAL/PLATELET
Basophils Absolute: 0 10*3/uL (ref 0.0–0.1)
Basophils Relative: 1 %
EOS ABS: 0.1 10*3/uL (ref 0.0–0.7)
Eosinophils Relative: 2 %
HEMATOCRIT: 25.5 % — AB (ref 39.0–52.0)
HEMOGLOBIN: 8.8 g/dL — AB (ref 13.0–17.0)
LYMPHS ABS: 1.6 10*3/uL (ref 0.7–4.0)
Lymphocytes Relative: 31 %
MCH: 28.4 pg (ref 26.0–34.0)
MCHC: 34.5 g/dL (ref 30.0–36.0)
MCV: 82.3 fL (ref 78.0–100.0)
MONO ABS: 0.5 10*3/uL (ref 0.1–1.0)
MONOS PCT: 9 %
NEUTROS ABS: 3 10*3/uL (ref 1.7–7.7)
NEUTROS PCT: 57 %
Platelets: 343 10*3/uL (ref 150–400)
RBC: 3.1 MIL/uL — ABNORMAL LOW (ref 4.22–5.81)
RDW: 15.4 % (ref 11.5–15.5)
WBC: 5.2 10*3/uL (ref 4.0–10.5)

## 2017-08-20 LAB — BASIC METABOLIC PANEL
Anion gap: 6 (ref 5–15)
CHLORIDE: 110 mmol/L (ref 101–111)
CO2: 27 mmol/L (ref 22–32)
CREATININE: 0.86 mg/dL (ref 0.61–1.24)
Calcium: 7.9 mg/dL — ABNORMAL LOW (ref 8.9–10.3)
GFR calc Af Amer: >60 mL/min (ref >60)
GFR calc non Af Amer: >60 mL/min (ref >60)
Glucose, Bld: 124 mg/dL — ABNORMAL HIGH (ref 65–99)
Potassium: 2.6 mmol/L — CL (ref 3.5–5.1)
Sodium: 143 mmol/L (ref 135–145)

## 2017-08-20 LAB — CULTURE, BLOOD (ROUTINE X 2): Special Requests: ADEQUATE

## 2017-08-20 LAB — GLUCOSE, CAPILLARY
GLUCOSE-CAPILLARY: 103 mg/dL — AB (ref 65–99)
Glucose-Capillary: 117 mg/dL — ABNORMAL HIGH (ref 65–99)
Glucose-Capillary: 286 mg/dL — ABNORMAL HIGH (ref 65–99)

## 2017-08-20 MED ORDER — COLLAGENASE 250 UNIT/GM EX OINT
TOPICAL_OINTMENT | Freq: Every day | CUTANEOUS | Status: DC
  Administered 2017-08-20: 16:00:00 via TOPICAL
  Filled 2017-08-20: qty 30

## 2017-08-20 MED ORDER — AMPHETAMINE-DEXTROAMPHETAMINE 10 MG PO TABS
30.0000 mg | ORAL_TABLET | Freq: Every day | ORAL | Status: DC
  Administered 2017-08-20: 30 mg via ORAL
  Filled 2017-08-20: qty 3

## 2017-08-20 MED ORDER — ACETAMINOPHEN 500 MG PO TABS: 500.0000 mg | ORAL_TABLET | Freq: Four times a day (QID) | ORAL | 0 refills | Status: DC | PRN

## 2017-08-20 MED ORDER — POTASSIUM CHLORIDE 10 MEQ/100ML IV SOLN
10.0000 meq | INTRAVENOUS | Status: AC
  Filled 2017-08-20: qty 100

## 2017-08-20 MED ORDER — POTASSIUM CHLORIDE CRYS ER 20 MEQ PO TBCR
40.0000 meq | EXTENDED_RELEASE_TABLET | Freq: Once | ORAL | Status: DC
  Filled 2017-08-20: qty 2

## 2017-08-20 MED ORDER — DOXYCYCLINE HYCLATE 100 MG PO CAPS: 100.0000 mg | ORAL_CAPSULE | Freq: Two times a day (BID) | ORAL | 0 refills | Status: AC

## 2017-08-20 MED ORDER — BLOOD GLUCOSE METER KIT: PACK | 0 refills | Status: DC

## 2017-08-20 MED ORDER — POTASSIUM CHLORIDE CRYS ER 20 MEQ PO TBCR: 40.0000 meq | EXTENDED_RELEASE_TABLET | Freq: Once | ORAL | Status: DC

## 2017-08-20 MED ORDER — POTASSIUM CHLORIDE 20 MEQ PO PACK
40.0000 meq | PACK | ORAL | Status: AC
  Administered 2017-08-20: 40 meq via ORAL
  Filled 2017-08-20 (×2): qty 2

## 2017-08-20 NOTE — Progress Notes (Signed)
Patient refused Potassium PO and IV. States that "pills makes me sick and IV burns".

## 2017-08-20 NOTE — Progress Notes (Signed)
CRITICAL VALUE ALERT  Critical Value:  K+ 2.6  Date & Time Notied:  08/20/17 0505  Provider Notified: Bruna Potter, NP  Orders Received/Actions taken: Potassium PO and IV ordered

## 2017-08-20 NOTE — Consult Note (Signed)
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  Present on admission  Occurred as a result of prolonged pressure on furniture.  Measurement: 6 cm x 4.2 cm x 0.2 cm  Wound QJF:HLKT pink with 25%  yellow adherent fibrin slough Drainage (amount, consistency, odor) Minimal yellow effluent Periwound:Calloused periwound Dressing procedure/placement/frequency: Trimmed as much loosely adherent callous skin from heel.  Will begin Santyl for debridement.  Cleanse left heel with Ns and pat gently dry.  Apply Santyl to wound bed.  Cover with NS moist gauze.  Cover with dry dressing and kerlix/tape.  Change daily.  Will not follow at this time.  Please re-consult if needed.  Maple Hudson RN BSN CWON Pager [phone]

## 2017-08-20 NOTE — Care Management (Addendum)
  Patient requesting ambulance ride home. Paperwork in shadow chart. Nurse will call [facility] when patient ready for discharge.    Patient wanting a home health RN to do wound care. Confirmed face sheet information with patient.   Patient lives with roommate. Explained home health nurse will not be there every day to do daily dressing change. [facility] will teach patient and someone else how to change dressing. Patient states he is not going to be responsible for dressing change and he has no one to help him. Again explained [facility] would have to have someone to teach before referral would be accepted. Patient asking if dressing could be changed just once a week instead of daily. Explained recommendations at present are daily.    Explained again patient or some one else at home would have to learn wound care for [facility] to be arranged .   Patient again said he is not going to learn wound care. Patient voices understanding [facility] cannot be arranged.  MD aware.   Ronny Flurry RN BSN

## 2017-08-20 NOTE — Discharge Summary (Signed)
Physician Discharge Summary  [name] WVP:[id number] DOB: October 26, 1986 DOA: 08/17/2017  PCP: Nolene Ebbs, MD  Admit date: 08/17/2017 Discharge date: 08/20/2017  Admitted From: Home Disposition:  Home   Recommendations for Outpatient Follow-up:  1. Follow up with PCP in 1- weeks 2. Patient he is leaving [place], patient has refused to have workup for his foot ulcer, rule out osteomyelitis, he understands the risk of not pursuing further diagnostics including worsening infection, also of limb and death. He mentions he will follow-up as an outpatient, will prescribe him doxycycline for the next 10 days.   Home Health: NA Equipment/Devices: NA  Discharge Condition:Stable CODE STATUS: Full  Diet recommendation: Diabetic diet  Brief/Interim Summary: 31 year old male who presented with altered mentation. Patient is known to have type 1 diabetes mellitus, hypertension, polysubstance abuse, schizoaffective disorder/ schizophrenia, and gastroesophageal reflux. Apparently he has been noncompliant with his insulin therapy, at arrival patient was somnolent unable to give any history. On initial physical examination blood pressure 109/76, heart rate 74, respiratory rate 18, oxygen saturation 100%, temperature 91.6. He was somnolent, dry mucous membranes, lungs were clear to auscultation bilaterally, heart S1-S2 present rhythmic, his abdomen was soft nontender, he had a left heel wound. Sodium 130, potassium 4.5, chloride 102, bicarbonate less than 7, glucose 553, BUN 14, creatinine 1.68, anion gap at least 21, white count 26.1, hemoglobin 10.7, hematocrit 33.0, platelets 676. Urinalysis with greater than 500 glucose, specific gravity 1.016, 30 protein. Urine drug screen negative, head CT-CTA negative, left foot x-ray negative for bony involvement, EKG normal sinus rhythm, rate 92 bpm, left axis deviation, normal intervals.  Patient was admitted to the intensive care unit with the  working diagnosis of diabetes ketoacidosis.   1. Diabetes ketoacidosis with metabolic encephalopathy. Patient was admitted to the intensive care unit, he was placed on IV fluids, IV insulin, electrolytes were repleted. His anion gap corrected, his encephalopathy improved, his diet was advanced, he was bridged to subcutaneous insulin with no major complications. He said decompensation was deemed to medical noncompliance.   2. Type 1 diabetes mellitus. Patient was placed on insulin sliding-scale, he received 8 units of insulin Levemir as a basal regimen. He will resume his insulin regimen with 30 units of Levemir daily plus insulin sliding-scale.   3. Left foot ulcer. Significant purulence, stage III, no surrounding erythema, wound care was consulted, instructions to clean the wound with normal saline, apply nonadherent silicone foam dressing, and change daily. MRI was ordered, but patient decided to leave the hospital without this study. He understands the consequences of not pursuing further workup, risk of osteomyelitis, worsening infection, loosing limb, and death, he has decided to follow-up as an outpatient. Will prescribe Doxyxycline to take twice daily. Home health requested. Patient received IV vancomycin and IV Zosyn while hospitalized.   4. Acute kidney injury with hypokalemia, and hyperchromic metabolic acidosis. Patient received normal saline intravenously, with correction of kidney function, discharge creatinine has normalized. As a consequence of fluid resuscitation, patient developed hyperchloremic nongap metabolic acidosis. IV fluids were discontinued, potassium was repleted. Discharge bicarbonate 27.   5. Schizophrenia. Patient remained stable, doxepin and trazodone were esumed as well as adderall.     Discharge Diagnoses:  Active Problems:   DKA, type 1 ([place])   Cellulitis    Discharge Instructions   Allergies as of 08/20/2017      Reactions   Sulfa Antibiotics Other (See  Comments)   Unknown childhood allergy      Medication List  STOP taking these medications   accu-chek multiclix lancets   glucose blood test strip   metoCLOPramide 10 MG tablet Commonly known as:  REGLAN   ondansetron 4 MG disintegrating tablet Commonly known as:  ZOFRAN ODT   Pen Needles 31G X 5 MM Misc   traMADol 50 MG tablet Commonly known as:  ULTRAM     TAKE these medications   acetaminophen 500 MG tablet Commonly known as:  TYLENOL Take 1 tablet (500 mg total) by mouth every 6 (six) hours as needed.   amphetamine-dextroamphetamine 30 MG tablet Commonly known as:  ADDERALL Take 1 tablet by mouth daily.   blood glucose meter kit and supplies Dispense based on patient and insurance preference. Use up to four times daily as directed. (FOR ICD-9 250.00, 250.01).   collagenase ointment Commonly known as:  SANTYL Apply Santyl to left heel wound Q day, then cover with moist gauze and 4X4 and kerlex   doxepin 10 MG/ML solution Commonly known as:  SINEQUAN Take 10 mLs by mouth at bedtime.   doxycycline 100 MG capsule Commonly known as:  VIBRAMYCIN Take 1 capsule (100 mg total) by mouth 2 (two) times daily.   insulin aspart 100 UNIT/ML FlexPen Commonly known as:  NOVOLOG FLEXPEN Check Blood Sugar 4 times per day > with meals and at bedtime CBG 70 - 120: 0 units CBG 121 - 150: 2 units CBG 151 - 200: 3 units CBG 201 - 250: 5 units CBG 251 - 300: 8 units CBG 301 - 350: 11 units CBG 351 - 400: 15 units What changed:  how much to take  how to take this  when to take this  additional instructions   insulin detemir 100 UNIT/ML injection Commonly known as:  LEVEMIR Inject 0.3 mLs (30 Units total) into the skin daily.   pantoprazole 40 MG tablet Commonly known as:  PROTONIX Take 1 tablet (40 mg total) by mouth daily.   sennosides-docusate sodium 8.6-50 MG tablet Commonly known as:  SENOKOT-S Take 1 tablet by mouth as needed for constipation.   traZODone 150  MG tablet Commonly known as:  DESYREL Take 150 mg by mouth at bedtime.            Discharge Care Instructions        Start     Ordered   08/20/17 0000  Increase activity slowly     08/20/17 1707   08/20/17 0000  Diet - low sodium heart healthy     08/20/17 1707   08/20/17 0000  Discharge instructions    Comments:  Please follow with primary care in 7 days.   08/20/17 1707   08/20/17 0000  blood glucose meter kit and supplies    Question Answer Comment  Number of strips 90   Number of lancets 90      08/20/17 1707   08/20/17 0000  doxycycline (VIBRAMYCIN) 100 MG capsule  2 times daily     08/20/17 1707   08/20/17 0000  acetaminophen (TYLENOL) 500 MG tablet  Every 6 hours PRN     08/20/17 1707      Allergies  Allergen Reactions  . Sulfa Antibiotics Other (See Comments)    Unknown childhood allergy    Consultations:     Procedures/Studies: Ct Angio Head W Or Wo Contrast  Result Date: 08/17/2017 CLINICAL DATA:  Code stroke.  Complete visual loss.  Hyperglycemia. EXAM: CT ANGIOGRAPHY HEAD AND NECK TECHNIQUE: Multidetector CT imaging of the head and neck was performed  using the standard protocol during bolus administration of intravenous contrast. Multiplanar CT image reconstructions and MIPs were obtained to evaluate the vascular anatomy. Carotid stenosis measurements (when applicable) are obtained utilizing NASCET criteria, using the distal internal carotid diameter as the denominator. CONTRAST:  Dose currently not available.  Reference EMR. COMPARISON:  Head CT earlier today FINDINGS: CTA NECK FINDINGS Aortic arch: Normal.  Three vessel branching. Right carotid system: Smooth and widely patent. No atheromatous changes. Left carotid system: Smooth and widely patent. No atheromatous changes. Vertebral arteries: No proximal subclavian stenosis. Codominant vertebral arteries that are smooth and widely patent. Skeleton: No acute or aggressive finding. There is poor dentition  with associated sclerosis of the alveolar ridge in both the mandible and maxilla. Other neck: No noted mass or inflammation. Upper chest: Negative Review of the MIP images confirms the above findings CTA HEAD FINDINGS Anterior circulation: Vessels are smooth and widely patent. Negative for aneurysm or beading. Posterior circulation: Codominant vertebral arteries. The vertebral and basilar arteries are widely patent. No branch occlusion or stenosis noted. Negative for beading or aneurysm. Venous sinuses: Patent Anatomic variants: Incomplete circle-of-Willis at the right posterior communicating artery. Delayed phase: Not performed in the emergent setting. Review of the MIP images confirms the above findings IMPRESSION: Negative CTA of the head and neck. Electronically Signed   By: Monte Fantasia M.D.   On: 08/17/2017 09:12   Ct Head Wo Contrast  Result Date: 08/04/2017 CLINICAL DATA:  Altered mental status. EXAM: CT HEAD WITHOUT CONTRAST TECHNIQUE: Contiguous axial images were obtained from the base of the skull through the vertex without intravenous contrast. COMPARISON:  07/05/2007 FINDINGS: Brain: No evidence of acute infarction, hemorrhage, hydrocephalus, extra-axial collection or mass lesion/mass effect. Vascular: No hyperdense vessel or unexpected calcification. Skull: Normal. Negative for fracture or focal lesion. Sinuses/Orbits: No acute finding. Other: None. IMPRESSION: No acute intracranial abnormality. Electronically Signed   By: Fidela Salisbury M.D.   On: 08/04/2017 19:03   Ct Angio Neck W Or Wo Contrast  Result Date: 08/17/2017 CLINICAL DATA:  Code stroke.  Complete visual loss.  Hyperglycemia. EXAM: CT ANGIOGRAPHY HEAD AND NECK TECHNIQUE: Multidetector CT imaging of the head and neck was performed using the standard protocol during bolus administration of intravenous contrast. Multiplanar CT image reconstructions and MIPs were obtained to evaluate the vascular anatomy. Carotid stenosis  measurements (when applicable) are obtained utilizing NASCET criteria, using the distal internal carotid diameter as the denominator. CONTRAST:  Dose currently not available.  Reference EMR. COMPARISON:  Head CT earlier today FINDINGS: CTA NECK FINDINGS Aortic arch: Normal.  Three vessel branching. Right carotid system: Smooth and widely patent. No atheromatous changes. Left carotid system: Smooth and widely patent. No atheromatous changes. Vertebral arteries: No proximal subclavian stenosis. Codominant vertebral arteries that are smooth and widely patent. Skeleton: No acute or aggressive finding. There is poor dentition with associated sclerosis of the alveolar ridge in both the mandible and maxilla. Other neck: No noted mass or inflammation. Upper chest: Negative Review of the MIP images confirms the above findings CTA HEAD FINDINGS Anterior circulation: Vessels are smooth and widely patent. Negative for aneurysm or beading. Posterior circulation: Codominant vertebral arteries. The vertebral and basilar arteries are widely patent. No branch occlusion or stenosis noted. Negative for beading or aneurysm. Venous sinuses: Patent Anatomic variants: Incomplete circle-of-Willis at the right posterior communicating artery. Delayed phase: Not performed in the emergent setting. Review of the MIP images confirms the above findings IMPRESSION: Negative CTA of the head and neck.  Electronically Signed   By: Monte Fantasia M.D.   On: 08/17/2017 09:12   Dg Chest Port 1 View  Result Date: 08/07/2017 CLINICAL DATA:  Intubation. EXAM: PORTABLE CHEST 1 VIEW COMPARISON:  08/06/2017. FINDINGS: Endotracheal tube and NG tube in stable position. Heart size normal. Low lung volumes with bibasilar atelectasis. Tiny right-sided pleural effusion cannot be excluded on today's exam. No left pleural effusion. No pneumothorax. IMPRESSION: 1.  Lines and tubes in stable position. 2. Low lung volumes with mild basilar atelectasis again noted  without interim change. Small right pleural effusion cannot be excluded. No left pleural effusion noted on today's exam. Electronically Signed   By: Marcello Moores  Register   On: 08/07/2017 06:40   Dg Chest Port 1 View  Result Date: 08/06/2017 CLINICAL DATA:  Acute respiratory acidosis . EXAM: PORTABLE CHEST 1 VIEW COMPARISON:  08/04/2017 .  CT 06/10/2017 . FINDINGS: Endotracheal tube and NG tube in stable position. Heart size stable. Low lung volumes with mild basilar atelectasis. Small bilateral pleural effusions cannot be excluded. IMPRESSION: 1. Lines and tubes in stable position. 2. Low lung volumes with mild basilar atelectasis. Small bilateral pleural effusions cannot be excluded. Electronically Signed   By: Marcello Moores  Register   On: 08/06/2017 06:46   Dg Chest Portable 1 View  Result Date: 08/04/2017 CLINICAL DATA:  Intubated EXAM: PORTABLE CHEST 1 VIEW COMPARISON:  Chest radiograph from earlier today. FINDINGS: Endotracheal tube tip is 6.5 cm above the carina. Enteric tube terminates in the proximal stomach with the side port in the lower thoracic esophagus. Stable cardiomediastinal silhouette with normal heart size. No pneumothorax. No pleural effusion. Mild bibasilar atelectasis. No pulmonary edema. IMPRESSION: 1. Endotracheal tube tip 6.5 cm above the carina. 2. Enteric tube terminates in the proximal stomach with the side port of the lower thoracic esophagus, consider advancing 8-10 cm. 3. Mild right basilar atelectasis . Electronically Signed   By: Ilona Sorrel M.D.   On: 08/04/2017 19:47   Dg Chest Portable 1 View  Result Date: 08/04/2017 CLINICAL DATA:  Hyperglycemia. EXAM: PORTABLE CHEST 1 VIEW COMPARISON:  July 30, 2017 FINDINGS: The heart size and mediastinal contours are within normal limits. Both lungs are clear. The visualized skeletal structures are unremarkable. IMPRESSION: No active disease. Electronically Signed   By: Dorise Bullion III M.D   On: 08/04/2017 16:27   Dg Chest Portable  1 View  Result Date: 07/30/2017 CLINICAL DATA:  Diabetic, abnormal glucose. EXAM: PORTABLE CHEST 1 VIEW COMPARISON:  07/04/2017. FINDINGS: Trachea is none. Heart size normal. Lungs are clear. No pleural fluid. IMPRESSION: Negative. Electronically Signed   By: Lorin Picket M.D.   On: 07/30/2017 09:09   Dg Abd Portable 1v  Result Date: 08/04/2017 CLINICAL DATA:  Orogastric tube placement. EXAM: PORTABLE ABDOMEN - 1 VIEW COMPARISON:  07/05/2018 FINDINGS: The bowel gas pattern is normal. No radio-opaque calculi or other significant radiographic abnormality are seen. Orogastric tube descends inferior to the left hemidiaphragm in the expected location of gastric body. IMPRESSION: Orogastric tube likely within gastric body. Electronically Signed   By: Fidela Salisbury M.D.   On: 08/04/2017 23:29   Dg Foot 2 Views Left  Result Date: 08/17/2017 CLINICAL DATA:  Diabetic with heel ulcer. EXAM: LEFT FOOT - 2 VIEW COMPARISON:  Radiographs 07/30/2017 and 07/06/2017. FINDINGS: There is apparent bandage material over the plantar aspect of the hindfoot and midfoot. Mild adjacent soft tissue irregularity is suggested on the lateral view, consistent with an ulcer. No definite soft tissue emphysema  or bone destruction. There is no evidence of acute fracture or dislocation. The joint spaces appear maintained. IMPRESSION: No osseous abnormality demonstrated. Plantar hindfoot soft tissue irregularity consistent with an ulcer. Electronically Signed   By: Richardean Sale M.D.   On: 08/17/2017 11:47   Dg Foot 2 Views Left  Result Date: 07/30/2017 CLINICAL DATA:  Delayed wound healing. EXAM: LEFT FOOT - 2 VIEW COMPARISON:  Radiographs of July 06, 2017. FINDINGS: There is no evidence of fracture or dislocation. There is no evidence of arthropathy or other focal bone abnormality. Soft tissues are unremarkable. IMPRESSION: Normal left foot. Electronically Signed   By: Marijo Conception, M.D.   On: 07/30/2017 13:32   Xr C-arm  No Report  Result Date: 07/24/2017 Please see Notes or Procedures tab for imaging impression.  Ct Head Code Stroke Wo Contrast  Result Date: 08/17/2017 CLINICAL DATA:  Code stroke.  Complete visual loss. EXAM: CT HEAD WITHOUT CONTRAST TECHNIQUE: Contiguous axial images were obtained from the base of the skull through the vertex without intravenous contrast. COMPARISON:  08/04/2017 FINDINGS: Brain: No evidence of acute infarction, hemorrhage, hydrocephalus, extra-axial collection or mass lesion/mass effect. Vascular: No hyperdense vessel or unexpected calcification. Skull: Normal. Negative for fracture or focal lesion. Sinuses/Orbits: No acute finding. Other: Text page with results sent on 08/17/2017 at 8:52 am to Dr. Leonel Ramsay. ASPECTS [facility] Stroke Program Early CT Score) Not scored with this symptomatology. IMPRESSION: Negative head CT. Electronically Signed   By: Monte Fantasia M.D.   On: 08/17/2017 08:53       Subjective: Patient is feeling adamant about leaving the hospital, denies any current pain, shortness of breath, no nausea or vomiting.  Discharge Exam: Vitals:   08/20/17 0519 08/20/17 1346  BP: 124/81 (!) 133/91  Pulse: (!) 101 (!) 116  Resp: 20   Temp: 98.5 F (36.9 C) 98.7 F (37.1 C)  SpO2: 100% 100%   Vitals:   08/19/17 1454 08/19/17 2051 08/20/17 0519 08/20/17 1346  BP: 140/90 135/89 124/81 (!) 133/91  Pulse: (!) 107 96 (!) 101 (!) 116  Resp: '20 20 20   '$ Temp: 98.5 F (36.9 C) 98.4 F (36.9 C) 98.5 F (36.9 C) 98.7 F (37.1 C)  TempSrc: Oral Oral Oral Oral  SpO2: 100% 100% 100% 100%  Weight:      Height:        General: Not in pain or dyspnea Neurology: Awake and alert, non focal  E ENT: no pallor, no icterus, oral mucosa moist Cardiovascular: S1-S2 present, rhythmic, no gallops, rubs, or murmurs. No jugular venous distention, no lower extremity edema. Pulmonary: vesicular breath sounds bilaterally, adequate air movement, no wheezing, rhonchi or  rales. Gastrointestinal. Abdomen flat, no organomegaly, non tender, no rebound or guarding Skin. Left foot with large ulcerated wound at the heal.  Musculoskeletal: no joint deformities    The results of significant diagnostics from this hospitalization (including imaging, microbiology, ancillary and laboratory) are listed below for reference.     Microbiology: Recent Results (from the past 240 hour(s))  Blood culture (routine x 2)     Status: Abnormal   Collection Time: 08/17/17  9:54 AM  Result Value Ref Range Status   Specimen Description BLOOD LEFT HAND  Final   Special Requests IN PEDIATRIC BOTTLE Blood Culture adequate volume  Final   Culture  Setup Time   Final    GRAM POSITIVE COCCI IN CLUSTERS IN PEDIATRIC BOTTLE CRITICAL RESULT CALLED TO, READ BACK BY AND VERIFIED WITH: L FOLTANSKI,[name]  AT 0751 08/18/17 BY L BENFIELD    Culture (A)  Final    STAPHYLOCOCCUS SPECIES (COAGULASE NEGATIVE) THE SIGNIFICANCE OF ISOLATING THIS ORGANISM FROM A SINGLE SET OF BLOOD CULTURES WHEN MULTIPLE SETS ARE DRAWN IS UNCERTAIN. PLEASE NOTIFY THE MICROBIOLOGY DEPARTMENT WITHIN ONE WEEK IF SPECIATION AND SENSITIVITIES ARE REQUIRED.    Report Status 08/20/2017 FINAL  Final  Blood culture (routine x 2)     Status: None (Preliminary result)   Collection Time: 08/17/17  9:58 AM  Result Value Ref Range Status   Specimen Description BLOOD LEFT WRIST  Final   Special Requests IN PEDIATRIC BOTTLE Blood Culture adequate volume  Final   Culture NO GROWTH 3 DAYS  Final   Report Status PENDING  Incomplete  Culture, blood (Routine X 2) w Reflex to ID Panel     Status: None (Preliminary result)   Collection Time: 08/18/17 11:07 AM  Result Value Ref Range Status   Specimen Description BLOOD LEFT HAND  Final   Special Requests IN PEDIATRIC BOTTLE Blood Culture adequate volume  Final   Culture NO GROWTH 2 DAYS  Final   Report Status PENDING  Incomplete  Culture, blood (Routine X 2) w Reflex to ID Panel      Status: None (Preliminary result)   Collection Time: 08/18/17 11:12 AM  Result Value Ref Range Status   Specimen Description BLOOD LEFT HAND  Final   Special Requests IN PEDIATRIC BOTTLE Blood Culture adequate volume  Final   Culture NO GROWTH 2 DAYS  Final   Report Status PENDING  Incomplete     Labs: BNP (last 3 results) No results for input(s): BNP in the last 8760 hours. Basic Metabolic Panel:  Recent Labs Lab 08/17/17 1023  08/17/17 1934 08/17/17 2331 08/18/17 0319 08/18/17 0752 08/20/17 0320  NA 134*  < > 138 137 140 139 143  K 4.1  < > 3.1* 2.8* 3.1* 3.1* 2.6*  CL 106  < > 114* 115* 116* 116* 110  CO2 <7*  < > 11* 16* 15* 13* 27  GLUCOSE 452*  < > 193* 186* 128* 160* 124*  BUN 12  < > '13 10 11 10 '$ <5*  CREATININE 1.57*  < > 1.36* 1.09 1.07 1.13 0.86  CALCIUM 8.7*  < > 8.5* 8.3* 8.5* 8.3* 7.9*  MG 1.7  --   --   --   --   --   --   PHOS 4.2  --   --   --   --   --   --   < > = values in this interval not displayed. Liver Function Tests:  Recent Labs Lab 08/17/17 0931 08/17/17 1038  AST 31 24  ALT 56 60  ALKPHOS 138* 159*  BILITOT 1.9* 1.8*  PROT 5.9* 6.6  ALBUMIN 3.2* 3.4*   No results for input(s): LIPASE, AMYLASE in the last 168 hours. No results for input(s): AMMONIA in the last 168 hours. CBC:  Recent Labs Lab 08/17/17 0931 08/17/17 1023 08/20/17 0320  WBC 26.1* 33.6* 5.2  NEUTROABS 23.5*  --  3.0  HGB 10.7* 12.5* 8.8*  HCT 33.0* 37.7* 25.5*  MCV 88.7 86.7 82.3  PLT 676* 701* 343   Cardiac Enzymes: No results for input(s): CKTOTAL, CKMB, CKMBINDEX, TROPONINI in the last 168 hours. BNP: Invalid input(s): POCBNP CBG:  Recent Labs Lab 08/19/17 1330 08/19/17 1550 08/19/17 2312 08/20/17 0814 08/20/17 1203  GLUCAP 92 156* 228* 117* 286*   D-Dimer No results for input(s):  DDIMER in the last 72 hours. Hgb A1c No results for input(s): HGBA1C in the last 72 hours. Lipid Profile No results for input(s): CHOL, HDL, LDLCALC, TRIG, CHOLHDL,  LDLDIRECT in the last 72 hours. Thyroid function studies No results for input(s): TSH, T4TOTAL, T3FREE, THYROIDAB in the last 72 hours.  Invalid input(s): FREET3 Anemia work up No results for input(s): VITAMINB12, FOLATE, FERRITIN, TIBC, IRON, RETICCTPCT in the last 72 hours. Urinalysis    Component Value Date/Time   COLORURINE YELLOW 08/17/2017 1031   APPEARANCEUR HAZY (A) 08/17/2017 1031   LABSPEC 1.016 08/17/2017 1031   PHURINE 5.0 08/17/2017 1031   GLUCOSEU >=500 (A) 08/17/2017 1031   HGBUR MODERATE (A) 08/17/2017 1031   BILIRUBINUR NEGATIVE 08/17/2017 1031   BILIRUBINUR negative 03/29/2015 1114   KETONESUR 80 (A) 08/17/2017 1031   PROTEINUR 30 (A) 08/17/2017 1031   UROBILINOGEN 0.2 05/17/2015 1810   NITRITE NEGATIVE 08/17/2017 1031   LEUKOCYTESUR NEGATIVE 08/17/2017 1031   Sepsis Labs Invalid input(s): PROCALCITONIN,  WBC,  LACTICIDVEN Microbiology Recent Results (from the past 240 hour(s))  Blood culture (routine x 2)     Status: Abnormal   Collection Time: 08/17/17  9:54 AM  Result Value Ref Range Status   Specimen Description BLOOD LEFT HAND  Final   Special Requests IN PEDIATRIC BOTTLE Blood Culture adequate volume  Final   Culture  Setup Time   Final    GRAM POSITIVE COCCI IN CLUSTERS IN PEDIATRIC BOTTLE CRITICAL RESULT CALLED TO, READ BACK BY AND VERIFIED WITH: L FOLTANSKI,[name] AT 4098 08/18/17 BY L BENFIELD    Culture (A)  Final    STAPHYLOCOCCUS SPECIES (COAGULASE NEGATIVE) THE SIGNIFICANCE OF ISOLATING THIS ORGANISM FROM A SINGLE SET OF BLOOD CULTURES WHEN MULTIPLE SETS ARE DRAWN IS UNCERTAIN. PLEASE NOTIFY THE MICROBIOLOGY DEPARTMENT WITHIN ONE WEEK IF SPECIATION AND SENSITIVITIES ARE REQUIRED.    Report Status 08/20/2017 FINAL  Final  Blood culture (routine x 2)     Status: None (Preliminary result)   Collection Time: 08/17/17  9:58 AM  Result Value Ref Range Status   Specimen Description BLOOD LEFT WRIST  Final   Special Requests IN PEDIATRIC BOTTLE  Blood Culture adequate volume  Final   Culture NO GROWTH 3 DAYS  Final   Report Status PENDING  Incomplete  Culture, blood (Routine X 2) w Reflex to ID Panel     Status: None (Preliminary result)   Collection Time: 08/18/17 11:07 AM  Result Value Ref Range Status   Specimen Description BLOOD LEFT HAND  Final   Special Requests IN PEDIATRIC BOTTLE Blood Culture adequate volume  Final   Culture NO GROWTH 2 DAYS  Final   Report Status PENDING  Incomplete  Culture, blood (Routine X 2) w Reflex to ID Panel     Status: None (Preliminary result)   Collection Time: 08/18/17 11:12 AM  Result Value Ref Range Status   Specimen Description BLOOD LEFT HAND  Final   Special Requests IN PEDIATRIC BOTTLE Blood Culture adequate volume  Final   Culture NO GROWTH 2 DAYS  Final   Report Status PENDING  Incomplete     Time coordinating discharge: 45 minutes  SIGNED:   Tawni Millers, MD  [facility] 08/20/2017, 4:45 PM Pager [phone]  If 7PM-7AM, please contact night-coverage [email or web address] Password [email or web address]

## 2017-08-20 NOTE — Discharge Planning (Signed)
Patient discharged home in stable condition. Verbalizes understanding of all discharge instructions, including home medications and follow up appointments. 

## 2017-08-21 LAB — GLUCOSE, CAPILLARY: GLUCOSE-CAPILLARY: 201 mg/dL — AB (ref 65–99)

## 2017-08-22 LAB — CULTURE, BLOOD (ROUTINE X 2)
CULTURE: NO GROWTH
Special Requests: ADEQUATE

## 2017-08-23 LAB — CULTURE, BLOOD (ROUTINE X 2)
CULTURE: NO GROWTH
Culture: NO GROWTH
SPECIAL REQUESTS: ADEQUATE
Special Requests: ADEQUATE

## 2017-09-07 ENCOUNTER — Emergency Department (HOSPITAL_COMMUNITY): Payer: Medicare Other

## 2017-09-07 ENCOUNTER — Encounter (HOSPITAL_COMMUNITY): Payer: Self-pay

## 2017-09-07 ENCOUNTER — Inpatient Hospital Stay (HOSPITAL_COMMUNITY)
Admission: EM | Admit: 2017-09-07 | Discharge: 2017-09-10 | DRG: 871 | Disposition: A | Discharge status: Home / Self Care | Payer: Medicare Other | Attending: Nephrology | Admitting: Nephrology

## 2017-09-07 DIAGNOSIS — Z9119 Patient's noncompliance with other medical treatment and regimen: Secondary | ICD-10-CM | POA: Diagnosis not present

## 2017-09-07 DIAGNOSIS — A419 Sepsis, unspecified organism: Secondary | ICD-10-CM | POA: Diagnosis not present

## 2017-09-07 DIAGNOSIS — E871 Hypo-osmolality and hyponatremia: Secondary | ICD-10-CM | POA: Diagnosis present

## 2017-09-07 DIAGNOSIS — D6489 Other specified anemias: Secondary | ICD-10-CM | POA: Diagnosis present

## 2017-09-07 DIAGNOSIS — E1010 Type 1 diabetes mellitus with ketoacidosis without coma: Secondary | ICD-10-CM | POA: Diagnosis present

## 2017-09-07 DIAGNOSIS — E876 Hypokalemia: Secondary | ICD-10-CM

## 2017-09-07 DIAGNOSIS — Z794 Long term (current) use of insulin: Secondary | ICD-10-CM

## 2017-09-07 DIAGNOSIS — L97509 Non-pressure chronic ulcer of other part of unspecified foot with unspecified severity: Secondary | ICD-10-CM | POA: Diagnosis present

## 2017-09-07 DIAGNOSIS — R17 Unspecified jaundice: Secondary | ICD-10-CM | POA: Diagnosis present

## 2017-09-07 DIAGNOSIS — G9341 Metabolic encephalopathy: Secondary | ICD-10-CM | POA: Diagnosis present

## 2017-09-07 DIAGNOSIS — E861 Hypovolemia: Secondary | ICD-10-CM | POA: Diagnosis present

## 2017-09-07 DIAGNOSIS — E10621 Type 1 diabetes mellitus with foot ulcer: Secondary | ICD-10-CM | POA: Diagnosis present

## 2017-09-07 DIAGNOSIS — N170 Acute kidney failure with tubular necrosis: Secondary | ICD-10-CM | POA: Diagnosis present

## 2017-09-07 DIAGNOSIS — F329 Major depressive disorder, single episode, unspecified: Secondary | ICD-10-CM | POA: Diagnosis present

## 2017-09-07 DIAGNOSIS — S91302A Unspecified open wound, left foot, initial encounter: Secondary | ICD-10-CM

## 2017-09-07 DIAGNOSIS — E0810 Diabetes mellitus due to underlying condition with ketoacidosis without coma: Secondary | ICD-10-CM

## 2017-09-07 DIAGNOSIS — L03116 Cellulitis of left lower limb: Secondary | ICD-10-CM

## 2017-09-07 DIAGNOSIS — L89623 Pressure ulcer of left heel, stage 3: Secondary | ICD-10-CM | POA: Diagnosis present

## 2017-09-07 DIAGNOSIS — L97421 Non-pressure chronic ulcer of left heel and midfoot limited to breakdown of skin: Secondary | ICD-10-CM | POA: Diagnosis not present

## 2017-09-07 DIAGNOSIS — T68XXXA Hypothermia, initial encounter: Secondary | ICD-10-CM | POA: Diagnosis present

## 2017-09-07 DIAGNOSIS — Z9114 Patient's other noncompliance with medication regimen: Secondary | ICD-10-CM

## 2017-09-07 DIAGNOSIS — G4700 Insomnia, unspecified: Secondary | ICD-10-CM | POA: Diagnosis present

## 2017-09-07 DIAGNOSIS — K219 Gastro-esophageal reflux disease without esophagitis: Secondary | ICD-10-CM | POA: Diagnosis present

## 2017-09-07 DIAGNOSIS — F17210 Nicotine dependence, cigarettes, uncomplicated: Secondary | ICD-10-CM | POA: Diagnosis present

## 2017-09-07 DIAGNOSIS — F419 Anxiety disorder, unspecified: Secondary | ICD-10-CM | POA: Diagnosis present

## 2017-09-07 DIAGNOSIS — E1110 Type 2 diabetes mellitus with ketoacidosis without coma: Secondary | ICD-10-CM | POA: Diagnosis present

## 2017-09-07 LAB — COMPREHENSIVE METABOLIC PANEL
ALBUMIN: 4.1 g/dL (ref 3.5–5.0)
ALK PHOS: 154 U/L — AB (ref 38–126)
ALT: 18 U/L (ref 17–63)
AST: 18 U/L (ref 15–41)
BILIRUBIN TOTAL: 2.9 mg/dL — AB (ref 0.3–1.2)
BUN: 28 mg/dL — AB (ref 6–20)
CO2: <7 mmol/L — ABNORMAL LOW (ref 22–32)
Calcium: 8.8 mg/dL — ABNORMAL LOW (ref 8.9–10.3)
Chloride: 76 mmol/L — ABNORMAL LOW (ref 101–111)
Creatinine, Ser: 3.09 mg/dL — ABNORMAL HIGH (ref 0.61–1.24)
GFR calc Af Amer: 29 mL/min — ABNORMAL LOW (ref >60)
GFR calc non Af Amer: 25 mL/min — ABNORMAL LOW (ref >60)
GLUCOSE: 1046 mg/dL — AB (ref 65–99)
Potassium: 6.6 mmol/L (ref 3.5–5.1)
Sodium: 117 mmol/L — CL (ref 135–145)
TOTAL PROTEIN: 6.9 g/dL (ref 6.5–8.1)

## 2017-09-07 LAB — URINALYSIS, ROUTINE W REFLEX MICROSCOPIC
Bilirubin Urine: NEGATIVE
Ketones, ur: 80 mg/dL — AB
Leukocytes, UA: NEGATIVE
Nitrite: NEGATIVE
PH: 5 (ref 5.0–8.0)
Protein, ur: 30 mg/dL — AB
Specific Gravity, Urine: 1.013 (ref 1.005–1.030)

## 2017-09-07 LAB — RAPID URINE DRUG SCREEN, HOSP PERFORMED
AMPHETAMINES: NOT DETECTED
BARBITURATES: NOT DETECTED
BENZODIAZEPINES: NOT DETECTED
Cocaine: NOT DETECTED
Opiates: NOT DETECTED
TETRAHYDROCANNABINOL: NOT DETECTED

## 2017-09-07 LAB — ACETAMINOPHEN LEVEL: Acetaminophen (Tylenol), Serum: <10 ug/mL — ABNORMAL LOW (ref 10–30)

## 2017-09-07 LAB — CBG MONITORING, ED: Glucose-Capillary: >600 mg/dL (ref 65–99)

## 2017-09-07 LAB — CBC WITH DIFFERENTIAL/PLATELET
Basophils Absolute: 0 10*3/uL (ref 0.0–0.1)
Basophils Relative: 0 %
Eosinophils Absolute: 0 10*3/uL (ref 0.0–0.7)
Eosinophils Relative: 0 %
HCT: 39 % (ref 39.0–52.0)
HEMOGLOBIN: 11.7 g/dL — AB (ref 13.0–17.0)
LYMPHS ABS: 1.8 10*3/uL (ref 0.7–4.0)
Lymphocytes Relative: 5 %
MCH: 29.4 pg (ref 26.0–34.0)
MCHC: 30 g/dL (ref 30.0–36.0)
MCV: 98 fL (ref 78.0–100.0)
MONOS PCT: 7 %
Monocytes Absolute: 2.5 10*3/uL — ABNORMAL HIGH (ref 0.1–1.0)
Neutro Abs: 31.8 10*3/uL — ABNORMAL HIGH (ref 1.7–7.7)
Neutrophils Relative %: 88 %
PLATELETS: 573 10*3/uL — AB (ref 150–400)
RBC: 3.98 MIL/uL — AB (ref 4.22–5.81)
RDW: 13.8 % (ref 11.5–15.5)
WBC: 36.1 10*3/uL — ABNORMAL HIGH (ref 4.0–10.5)

## 2017-09-07 LAB — I-STAT CG4 LACTIC ACID, ED: Lactic Acid, Venous: 4.09 mmol/L (ref 0.5–1.9)

## 2017-09-07 LAB — SALICYLATE LEVEL: Salicylate Lvl: <7 mg/dL (ref 2.8–30.0)

## 2017-09-07 LAB — ETHANOL

## 2017-09-07 LAB — GLUCOSE, CAPILLARY: Glucose-Capillary: 499 mg/dL — ABNORMAL HIGH (ref 65–99)

## 2017-09-07 LAB — LIPASE, BLOOD: Lipase: 162 U/L — ABNORMAL HIGH (ref 11–51)

## 2017-09-07 MED ORDER — DEXTROSE-NACL 5-0.45 % IV SOLN
INTRAVENOUS | Status: DC
  Administered 2017-09-08: 03:00:00 via INTRAVENOUS

## 2017-09-07 MED ORDER — STERILE WATER FOR INJECTION IV SOLN
INTRAVENOUS | Status: DC
  Administered 2017-09-08 – 2017-09-09 (×3): via INTRAVENOUS
  Filled 2017-09-07 (×8): qty 850

## 2017-09-07 MED ORDER — PANTOPRAZOLE SODIUM 40 MG IV SOLR
40.0000 mg | INTRAVENOUS | Status: DC
  Administered 2017-09-07 – 2017-09-09 (×3): 40 mg via INTRAVENOUS
  Filled 2017-09-07 (×4): qty 40

## 2017-09-07 MED ORDER — HEPARIN SODIUM (PORCINE) 5000 UNIT/ML IJ SOLN
5000.0000 [IU] | Freq: Three times a day (TID) | INTRAMUSCULAR | Status: DC
  Administered 2017-09-07 – 2017-09-10 (×5): 5000 [IU] via SUBCUTANEOUS
  Filled 2017-09-07 (×6): qty 1

## 2017-09-07 MED ORDER — VANCOMYCIN HCL 10 G IV SOLR
2000.0000 mg | INTRAVENOUS | Status: AC
  Administered 2017-09-07: 2000 mg via INTRAVENOUS
  Filled 2017-09-07 (×2): qty 2000

## 2017-09-07 MED ORDER — SODIUM CHLORIDE 0.9 % IV SOLN
INTRAVENOUS | Status: DC
  Administered 2017-09-07 – 2017-09-08 (×2): via INTRAVENOUS

## 2017-09-07 MED ORDER — SODIUM CHLORIDE 0.9 % IV SOLN
1250.0000 mg | INTRAVENOUS | Status: DC
Start: 2017-09-08 — End: 2017-09-09
  Filled 2017-09-07: qty 1250

## 2017-09-07 MED ORDER — SODIUM CHLORIDE 0.9 % IV SOLN
INTRAVENOUS | Status: AC
  Administered 2017-09-07: 19:00:00 via INTRAVENOUS

## 2017-09-07 MED ORDER — PIPERACILLIN-TAZOBACTAM 3.375 G IVPB
3.3750 g | Freq: Three times a day (TID) | INTRAVENOUS | Status: DC
  Administered 2017-09-08 (×4): 3.375 g via INTRAVENOUS
  Filled 2017-09-07 (×8): qty 50

## 2017-09-07 MED ORDER — DEXTROSE-NACL 5-0.45 % IV SOLN: INTRAVENOUS | Status: DC

## 2017-09-07 MED ORDER — SODIUM CHLORIDE 0.9 % IV SOLN
INTRAVENOUS | Status: DC
  Administered 2017-09-08: 14.4 [IU]/h via INTRAVENOUS
  Filled 2017-09-07: qty 1

## 2017-09-07 MED ORDER — SODIUM CHLORIDE 0.9 % IV BOLUS (SEPSIS)
30.0000 mL/kg | Freq: Once | INTRAVENOUS | Status: AC
Start: 2017-09-07 — End: 2017-09-07
  Administered 2017-09-07: 2721 mL via INTRAVENOUS

## 2017-09-07 MED ORDER — PIPERACILLIN-TAZOBACTAM 3.375 G IVPB 30 MIN
3.3750 g | Freq: Once | INTRAVENOUS | Status: AC
  Administered 2017-09-07: 3.375 g via INTRAVENOUS
  Filled 2017-09-07: qty 50

## 2017-09-07 MED ORDER — SODIUM CHLORIDE 0.9 % IV SOLN
INTRAVENOUS | Status: DC
  Administered 2017-09-07: 5.4 [IU]/h via INTRAVENOUS
  Filled 2017-09-07: qty 1

## 2017-09-07 NOTE — ED Notes (Signed)
Pharmacy notified for vancomycin and insulin

## 2017-09-07 NOTE — Progress Notes (Addendum)
Pharmacy Antibiotic Note  [name] is a 31 y.o. male admitted on 09/07/2017 with sepsis.  Pharmacy has been consulted for vancomycin and zosyn dosing. Pt is hypothermic. WBC is elevated at 36.1. Scr is elevated at 3.09 and lactic acid is elevated at 4.09.   Plan: Vanc 2gm IV x 1 then 1250mg  IV Q24H Zosyn 3.375gm IV Q8H (4 hr inf) F/u renal fxn, C&S, clinical status and trough at SS  Weight: 200 lb (90.7 kg)  Temp (24hrs), Avg:93.1 F (33.9 C), Min:93.1 F (33.9 C), Max:93.1 F (33.9 C)  No results for input(s): WBC, CREATININE, LATICACIDVEN, VANCOTROUGH, VANCOPEAK, VANCORANDOM, GENTTROUGH, GENTPEAK, GENTRANDOM, TOBRATROUGH, TOBRAPEAK, TOBRARND, AMIKACINPEAK, AMIKACINTROU, AMIKACIN in the last 168 hours.  Estimated Creatinine Clearance: 143.4 mL/min (by C-G formula based on SCr of 0.86 mg/dL).    Allergies  Allergen Reactions  . Sulfa Antibiotics Other (See Comments)    Unknown childhood allergy    Antimicrobials this admission: Vanc 9/21>> Zosyn 9/21>>  Dose adjustments this admission: N/A  Microbiology results: Pending  Thank you for allowing pharmacy to be a part of this patient's care.  [name], 09/07/2017 5:20 PM

## 2017-09-07 NOTE — ED Provider Notes (Signed)
[facility]-EMERGENCY DEPT Provider Note   CSN: [id number] Arrival date & time: 09/07/17  1650     History   Chief Complaint Chief Complaint  Patient presents with  . Hyperglycemia    HPI [name] is a 31 y.o. male.  The history is provided by the patient. No language interpreter was used.  Hyperglycemia   [name] is a 31 y.o. male who presents to the Emergency Department complaining of AMS.  He presents via EMS for change in mental status. EMS was called to his house 3 times a day for change in mental status and elevated blood sugar. He refused transport the first 2 times. On the third time he was too lethargic to refuse. EMS reported blood sugar was critical high.  Past Medical History:  Diagnosis Date  . Anemia   . Anxiety   . Arthritis    "both hips; both shoulders" (06/06/2017)  . Chicken pox   . Childhood asthma   . Chronic pain   . Depression   . DKA (diabetic ketoacidoses) ([name]) 11/18/2014  . DKA (diabetic ketoacidoses) ([name]) 07/05/2017  . GERD (gastroesophageal reflux disease)   . Hypertension   . Migraine    "a few/year" (06/06/2017)  . Noncompliance with medication regimen   . Pneumonia    "several times" (06/06/2017)  . Polysubstance abuse   . Schizo affective schizophrenia ([name])   . Scoliosis   . Type I diabetes mellitus [facility])     Patient Active Problem List   Diagnosis Date Noted  . Cellulitis   . DKA, type 1 ([name]) 08/17/2017  . Encounter for imaging study to confirm orogastric (OG) tube placement   . Acute respiratory acidosis   . DKA (diabetic ketoacidoses) ([name]) 08/04/2017  . Acute respiratory failure with hypoxia ([name])   . Seizure ([name])   . Diabetic ketoacidosis ([name]) 07/30/2017  . Normocytic anemia 07/05/2017  . Numbness of right hand 07/05/2017  . Elevated troponin I measurement   . Positive D dimer   . DKA, type 1, not at goal [facility]) 07/01/2017  . Hyperkalemia   . AKI (acute kidney injury) ([name])   . Medically noncompliant     . Hyperglycemia 06/16/2017  . Nausea & vomiting 06/15/2017  . Abdominal pain 06/15/2017  . Chest pain 06/07/2017  . GERD (gastroesophageal reflux disease) 06/07/2017  . Substance abuse 05/30/2016  . Substance induced mood disorder ([name]) 05/30/2016  . Tobacco use disorder 12/24/2015  . DM hyperosmolarity type I, uncontrolled ([name]) 12/19/2015  . Generalized headache 07/22/2015  . Bilateral hip bursitis 05/26/2015  . Depression   . Generalized anxiety disorder 03/29/2015  . Undifferentiated schizophrenia ([name])   . Hip pain, bilateral 11/18/2014  . Dehydration 06/17/2012    Past Surgical History:  Procedure Laterality Date  . CARDIAC CATHETERIZATION  11/2016  . INCISION AND DRAINAGE ABSCESS Left 11/2011   "MRSA removed off my thumb"       Home Medications    Prior to Admission medications   Medication Sig Start Date End Date Taking? Authorizing Provider  acetaminophen (TYLENOL) 500 MG tablet Take 1 tablet (500 mg total) by mouth every 6 (six) hours as needed. 08/20/17   Arrien, York Ram, MD  amphetamine-dextroamphetamine (ADDERALL) 30 MG tablet Take 1 tablet by mouth daily. 06/26/17   [provider]  blood glucose meter kit and supplies Dispense based on patient and insurance preference. Use up to four times daily as directed. (FOR ICD-9 250.00, 250.01). 08/20/17   Arrien, York Ram,  MD  collagenase (SANTYL) ointment Apply Santyl to left heel wound Q day, then cover with moist gauze and 4X4 and kerlex 08/08/17   Ollis, Velna Hatchet L, NP  doxepin (SINEQUAN) 10 MG/ML solution Take 10 mLs by mouth at bedtime. 07/11/17   [provider]  insulin aspart (NOVOLOG FLEXPEN) 100 UNIT/ML FlexPen Check Blood Sugar 4 times per day > with meals and at bedtime CBG 70 - 120: 0 units CBG 121 - 150: 2 units CBG 151 - 200: 3 units CBG 201 - 250: 5 units CBG 251 - 300: 8 units CBG 301 - 350: 11 units CBG 351 - 400: 15 units Patient taking differently: Inject 0-15 Units  into the skin See admin instructions. Check Blood Sugar 4 times per day > with meals and at bedtime CBG 70 - 120: 0 units CBG 121 - 150: 2 units CBG 151 - 200: 3 units CBG 201 - 250: 5 units CBG 251 - 300: 8 units CBG 301 - 350: 11 units CBG 351 - 400: 15 units 08/08/17   Ollis, Brandi L, NP  insulin detemir (LEVEMIR) 100 UNIT/ML injection Inject 0.3 mLs (30 Units total) into the skin daily. 08/08/17   Donita Brooks, NP  pantoprazole (PROTONIX) 40 MG tablet Take 1 tablet (40 mg total) by mouth daily. 06/10/17   Hosie Poisson, MD  sennosides-docusate sodium (SENOKOT-S) 8.6-50 MG tablet Take 1 tablet by mouth as needed for constipation.    [provider]  traZODone (DESYREL) 150 MG tablet Take 150 mg by mouth at bedtime. 06/26/17   [provider]    Family History Family History  Problem Relation Age of Onset  . Diabetes Mother     Social History Social History  Substance Use Topics  . Smoking status: Current Every Day Smoker    Packs/day: 0.50    Years: 9.00    Types: Cigarettes  . Smokeless tobacco: Never Used  . Alcohol use No     Allergies   Sulfa antibiotics   Review of Systems Review of Systems  All other systems reviewed and are negative.    Physical Exam Updated Vital Signs BP 106/69   Pulse (!) 113   Temp (!) 93.3 F (34.1 C) (Rectal) Comment: Bear hugger turned up to high.  Resp 15   Wt 90.7 kg (200 lb)   SpO2 100%   BMI 25.68 kg/m   Physical Exam  Constitutional: He appears well-developed. He appears distressed.  Ill appearing  HENT:  Head: Normocephalic and atraumatic.  Cardiovascular: Regular rhythm.   No murmur heard. Tachycardic  Pulmonary/Chest: Breath sounds normal. No respiratory distress.  tachypneic  Abdominal: Soft. There is no tenderness. There is no rebound and no guarding.  Musculoskeletal: He exhibits no edema or tenderness.  Large ulcer to the left heel. No significant surrounding erythema.  Neurological:   Lethargic, arouses to verbal stimuli. Dysarthric speech.  Skin: Skin is warm and dry. There is pallor.  Psychiatric: He has a normal mood and affect. His behavior is normal.  Nursing note and vitals reviewed.    ED Treatments / Results  Labs (all labs ordered are listed, but only abnormal results are displayed) Labs Reviewed  COMPREHENSIVE METABOLIC PANEL - Abnormal; Notable for the following:       Result Value   Sodium 117 (*)    Potassium 6.6 (*)    Chloride 76 (*)    CO2 <7 (*)    Glucose, Bld 1,046 (*)    BUN 28 (*)  Creatinine, Ser 3.09 (*)    Calcium 8.8 (*)    Alkaline Phosphatase 154 (*)    Total Bilirubin 2.9 (*)    GFR calc non Af Amer 25 (*)    GFR calc Af Amer 29 (*)    All other components within normal limits  LIPASE, BLOOD - Abnormal; Notable for the following:    Lipase 162 (*)    All other components within normal limits  ACETAMINOPHEN LEVEL - Abnormal; Notable for the following:    Acetaminophen (Tylenol), Serum <10 (*)    All other components within normal limits  URINALYSIS, ROUTINE W REFLEX MICROSCOPIC - Abnormal; Notable for the following:    Color, Urine STRAW (*)    Glucose, UA >=500 (*)    Hgb urine dipstick SMALL (*)    Ketones, ur 80 (*)    Protein, ur 30 (*)    Bacteria, UA RARE (*)    Squamous Epithelial / LPF 0-5 (*)    All other components within normal limits  CBC WITH DIFFERENTIAL/PLATELET - Abnormal; Notable for the following:    WBC 36.1 (*)    RBC 3.98 (*)    Hemoglobin 11.7 (*)    Platelets 573 (*)    Neutro Abs 31.8 (*)    Monocytes Absolute 2.5 (*)    All other components within normal limits  GLUCOSE, CAPILLARY - Abnormal; Notable for the following:    Glucose-Capillary >600 (*)    All other components within normal limits  GLUCOSE, CAPILLARY - Abnormal; Notable for the following:    Glucose-Capillary 499 (*)    All other components within normal limits  I-STAT CG4 LACTIC ACID, ED - Abnormal; Notable for the  following:    Lactic Acid, Venous 4.09 (*)    All other components within normal limits  CBG MONITORING, ED - Abnormal; Notable for the following:    Glucose-Capillary >600 (*)    All other components within normal limits  CBG MONITORING, ED - Abnormal; Notable for the following:    Glucose-Capillary >600 (*)    All other components within normal limits  MRSA PCR SCREENING  ETHANOL  SALICYLATE LEVEL  RAPID URINE DRUG SCREEN, HOSP PERFORMED  BASIC METABOLIC PANEL  BASIC METABOLIC PANEL  BLOOD GAS, ARTERIAL  BASIC METABOLIC PANEL  BASIC METABOLIC PANEL  I-STAT VENOUS BLOOD GAS, ED  I-STAT CHEM 8, ED    EKG  EKG Interpretation  Date/Time:  Friday September 07 2017 17:05:46 EDT Ventricular Rate:  102 PR Interval:    QRS Duration: 112 QT Interval:  360 QTC Calculation: 469 R Axis:   -65 Text Interpretation:  Sinus rhythm Paired ventricular premature complexes Incomplete RBBB and LAFB ST elev, probable normal early repol pattern Borderline prolonged QT interval Artifact Confirmed by Quintella Reichert [phone]) on 09/07/2017 7:42:12 PM       Radiology Dg Chest Port 1 View  Result Date: 09/07/2017 CLINICAL DATA:  Altered mental status. EXAM: PORTABLE CHEST 1 VIEW COMPARISON:  08/07/2017, 08/06/2017 and 06/03/2017 FINDINGS: Lungs are adequately inflated without consolidation or effusion. Cardiomediastinal silhouette and remainder the exam is unchanged. IMPRESSION: No active disease. Electronically Signed   By: Marin Olp M.D.   On: 09/07/2017 17:54    Procedures Procedures (including critical care time) CRITICAL CARE Performed by: Quintella Reichert   Total critical care time: 60 minutes  Critical care time was exclusive of separately billable procedures and treating other patients.  Critical care was necessary to treat or prevent imminent or life-threatening deterioration.  Critical care was time spent personally by me on the following activities: development of treatment  plan with patient and/or surrogate as well as nursing, discussions with consultants, evaluation of patient's response to treatment, examination of patient, obtaining history from patient or surrogate, ordering and performing treatments and interventions, ordering and review of laboratory studies, ordering and review of radiographic studies, pulse oximetry and re-evaluation of patient's condition.  Medications Ordered in ED Medications  vancomycin (VANCOCIN) 1,250 mg in sodium chloride 0.9 % 250 mL IVPB (not administered)  0.9 %  sodium chloride infusion ( Intravenous Stopped 09/07/17 2032)  0.9 %  sodium chloride infusion ( Intravenous Transfusing/Transfer 09/07/17 2032)  insulin regular (NOVOLIN R,HUMULIN R) 100 Units in sodium chloride 0.9 % 100 mL (1 Units/mL) infusion (13 Units/hr Intravenous Rate/Dose Change 09/07/17 2347)  heparin injection 5,000 Units (5,000 Units Subcutaneous Given 09/07/17 2153)  dextrose 5 %-0.45 % sodium chloride infusion (not administered)  pantoprazole (PROTONIX) injection 40 mg (40 mg Intravenous Given 09/07/17 2153)  piperacillin-tazobactam (ZOSYN) IVPB 3.375 g (not administered)  sodium bicarbonate 150 mEq in sterile water 1,000 mL infusion (not administered)  sodium chloride 0.9 % bolus 2,721 mL (0 mL/kg  90.7 kg Intravenous Stopped 09/07/17 2000)  piperacillin-tazobactam (ZOSYN) IVPB 3.375 g (0 g Intravenous Stopped 09/07/17 1818)  vancomycin (VANCOCIN) 2,000 mg in sodium chloride 0.9 % 500 mL IVPB (0 mg Intravenous Stopped 09/07/17 2032)     Initial Impression / Assessment and Plan / ED Course  I have reviewed the triage vital signs and the nursing notes.  Pertinent labs & imaging results that were available during my care of the patient were reviewed by me and considered in my medical decision making (see chart for details).     Patient with history of diabetes and noncompliance here with altered mental status. Presentation is consistent with DKA. He does have  a wound to his left heel, hypothermic on ED arrival. Concern for possible sepsis and he was started on IV antibiotics. Mental status did improve during his ED stay.  Intensivist consulted for admission for further treatment.  Final Clinical Impressions(s) / ED Diagnoses   Final diagnoses:  Diabetic ketoacidosis without coma associated with type 1 diabetes mellitus [facility])    New Prescriptions Current Discharge Medication List       Quintella Reichert, MD 09/08/17 0040

## 2017-09-07 NOTE — ED Notes (Signed)
Pt refusing blood cultures, edp notified

## 2017-09-07 NOTE — H&P (Signed)
PULMONARY / CRITICAL CARE MEDICINE   Name: [name] MRN: [id number] DOB: 1986-09-26    ADMISSION DATE:  09/07/2017 CONSULTATION DATE:  09/07/2017  REFERRING MD:  Dr. Ralene Bathe  CHIEF COMPLAINT:  DKA  HISTORY OF PRESENT ILLNESS:   31 year old male with type 1 diabetes mellitus and frequent recent admissions for DKA. He was recently discharged on 9/3 for this. His friend called EMS on 9/21 for AMS and the patient turned EMS at the door twice. On the third call he was obtunded and EMS transported him to ED, where POC glucose was found to be > 700 with venous pH of 6.9. He was started on IVF and insulin infusion. PCCM asked to admit.   PAST MEDICAL HISTORY :  He  has a past medical history of Anemia; Anxiety; Arthritis; Chicken pox; Childhood asthma; Chronic pain; Depression; DKA (diabetic ketoacidoses) ([place]) (11/18/2014); DKA (diabetic ketoacidoses) ([place]) (07/05/2017); GERD (gastroesophageal reflux disease); Hypertension; Migraine; Noncompliance with medication regimen; Pneumonia; Polysubstance abuse; Schizo affective schizophrenia ([place]); Scoliosis; and Type I diabetes mellitus ([place]).  PAST SURGICAL HISTORY: He  has a past surgical history that includes Incision and drainage abscess (Left, 11/2011) and Cardiac catheterization (11/2016).  Allergies  Allergen Reactions  . Sulfa Antibiotics Other (See Comments)    Unknown childhood allergy    No current facility-administered medications on file prior to encounter.    Current Outpatient Prescriptions on File Prior to Encounter  Medication Sig  . acetaminophen (TYLENOL) 500 MG tablet Take 1 tablet (500 mg total) by mouth every 6 (six) hours as needed.  [name] amphetamine-dextroamphetamine (ADDERALL) 30 MG tablet Take 1 tablet by mouth daily.  . blood glucose meter kit and supplies Dispense based on patient and insurance preference. Use up to four times daily as directed. (FOR ICD-9 250.00, 250.01).  . collagenase (SANTYL) ointment Apply  Santyl to left heel wound Q day, then cover with moist gauze and 4X4 and kerlex  . doxepin (SINEQUAN) 10 MG/ML solution Take 10 mLs by mouth at bedtime.  . insulin aspart (NOVOLOG FLEXPEN) 100 UNIT/ML FlexPen Check Blood Sugar 4 times per day > with meals and at bedtime CBG 70 - 120: 0 units CBG 121 - 150: 2 units CBG 151 - 200: 3 units CBG 201 - 250: 5 units CBG 251 - 300: 8 units CBG 301 - 350: 11 units CBG 351 - 400: 15 units (Patient taking differently: Inject 0-15 Units into the skin See admin instructions. Check Blood Sugar 4 times per day > with meals and at bedtime CBG 70 - 120: 0 units CBG 121 - 150: 2 units CBG 151 - 200: 3 units CBG 201 - 250: 5 units CBG 251 - 300: 8 units CBG 301 - 350: 11 units CBG 351 - 400: 15 units)  . insulin detemir (LEVEMIR) 100 UNIT/ML injection Inject 0.3 mLs (30 Units total) into the skin daily.  . pantoprazole (PROTONIX) 40 MG tablet Take 1 tablet (40 mg total) by mouth daily.  . sennosides-docusate sodium (SENOKOT-S) 8.6-50 MG tablet Take 1 tablet by mouth as needed for constipation.  . traZODone (DESYREL) 150 MG tablet Take 150 mg by mouth at bedtime.  . [DISCONTINUED] citalopram (CELEXA) 20 MG tablet Take 20 mg by mouth daily.  . [DISCONTINUED] FLUoxetine (PROZAC) 20 MG tablet Take 20 mg by mouth every morning.  . [DISCONTINUED] insulin NPH-regular Human (NOVOLIN 70/30) (70-30) 100 UNIT/ML injection Inject 40 Units into the skin 2 (two) times daily with a meal.  . [DISCONTINUED]  OLANZapine zydis (ZYPREXA) 10 MG disintegrating tablet Take 10 mg by mouth 2 (two) times daily.  . [DISCONTINUED] sertraline (ZOLOFT) 50 MG tablet Take 50 mg by mouth daily. For depression. Just started med, have not picked up yet [facility] [address]    FAMILY HISTORY:  His indicated that his mother is alive.    SOCIAL HISTORY: He  reports that he has been smoking Cigarettes.  He has a 4.50 pack-year smoking history. He has never used smokeless tobacco. He reports  that he uses drugs, including Marijuana, Cocaine, Methamphetamines, and "Crack" cocaine. He reports that he does not drink alcohol.  REVIEW OF SYSTEMS:   Unable as patient is encephalopathic  SUBJECTIVE:    VITAL SIGNS: BP (!) 93/49   Pulse (!) 101   Temp (!) 93.1 F (33.9 C) (Rectal)   Resp (!) 22   Wt 90.7 kg (200 lb)   SpO2 100%   BMI 25.68 kg/m   HEMODYNAMICS:    VENTILATOR SETTINGS:    INTAKE / OUTPUT: No intake/output data recorded.  PHYSICAL EXAMINATION: General:  Young adult male of normal body habitus in NAD Neuro:  Alert to verbal, oriented to self, hospital HEENT:  [place]/AT, PERRL, no JVD Cardiovascular:  Tachy, regular, no MRG Lungs:  Clear, deep rapid respirations Abdomen:  Soft, non-tender, non-distended Musculoskeletal:  No acute deformity or ROM limitation Skin:  Grossly intact   LABS:  BMET No results for input(s): NA, K, CL, CO2, BUN, CREATININE, GLUCOSE in the last 168 hours.  Electrolytes No results for input(s): CALCIUM, MG, PHOS in the last 168 hours.  CBC  Recent Labs Lab 09/07/17 1705  WBC 36.1*  HGB 11.7*  HCT 39.0  PLT 573*    Coag's No results for input(s): APTT, INR in the last 168 hours.  Sepsis Markers  Recent Labs Lab 09/07/17 1716  LATICACIDVEN 4.09*    ABG No results for input(s): PHART, PCO2ART, PO2ART in the last 168 hours.  Liver Enzymes No results for input(s): AST, ALT, ALKPHOS, BILITOT, ALBUMIN in the last 168 hours.  Cardiac Enzymes No results for input(s): TROPONINI, PROBNP in the last 168 hours.  Glucose No results for input(s): GLUCAP in the last 168 hours.  Imaging Dg Chest Port 1 View  Result Date: 09/07/2017 CLINICAL DATA:  Altered mental status. EXAM: PORTABLE CHEST 1 VIEW COMPARISON:  08/07/2017, 08/06/2017 and 06/03/2017 FINDINGS: Lungs are adequately inflated without consolidation or effusion. Cardiomediastinal silhouette and remainder the exam is unchanged. IMPRESSION: No active disease.  Electronically Signed   By: Marin Olp M.D.   On: 09/07/2017 17:54    STUDIES:    CULTURES: Blood 9/21 >  ANTIBIOTICS: Zosyn 9/21 > Vancomycin 9/21 >  SIGNIFICANT EVENTS: 9/21 admit  LINES/TUBES: 20G and 22G PIV's  DISCUSSION: 31 year old diabetic with history of non-compliance admitted for DKA 9/21.  ASSESSMENT / PLAN:  DKA. - IV fluids, bolus 2 additional liters - insulin gtt per protocol until anion gap closed - f/u electrolytes - change to D5 1/2 NS with CBG < 250 - ABG, lactic  SIRS possibly sepsis secondary to L foot wound.  - vancomycin, zoysn -  wound care  Hyponatremia (pseudo - corrects to 132) - monitor  Hypothermia. - likely from DKA and possible sepsis - warming blanket  Acute renal failure. - likely prerenal from volume depletion - IV fluids - Follow BMP and urine output  Anemia of critical illness. - f/u CBC  Elevated bilirubin. - Abd exam benign - f/u  LFT   DVT prophylaxis - SQ heparin SUP - protonix Nutrition - NPO Goals of care - full code  Georgann Housekeeper, AGACNP-BC [place] [facility]/Critical [facility] Pager [phone] or [phone]  09/07/2017 6:54 PM   Attending Addendum: I personally examined this patient and agree with plan as detailed above. 30yoM with insulin dependent DM and history of noncompliance, admitted with DKA. History is limited as patient refused to talk to me, answering every question with "I dont want to talk about it." He is somnolent but arousable to voice. He is reluctantly obeying commands. Lungs CTA b/l. Abd soft NTND. He has a foot ulcer and is hypothermic, raising suspicion for possible sepsis. He refused blood cultures. CXR showed no infiltrates. Received Vanc and Zosyn as well as 3L IVF bolus thus far. Insulin gtt is infusing. VBG with pH 6.9. He has AKI that is likely pre-renal; he consents to a condom catheter. Admit to ICU with DKA insulin protocol and Sepsis protocol.  35 minutes critical  care time  Vernie Murders, MD Pulmonary & Critical Care Medicine

## 2017-09-07 NOTE — ED Triage Notes (Signed)
Pt presents to the ed with high blood sugar, patient is lethargic on arrival to the ed. Pt is a noncompliant diabetic. CBG is reading high with ems. Pt has kussmal respirations.

## 2017-09-07 NOTE — ED Notes (Addendum)
Josie Saunders RN, notified of critical labs.

## 2017-09-07 NOTE — ED Notes (Signed)
Critical care at bedside  

## 2017-09-07 NOTE — ED Notes (Signed)
Pt asked If this NT could perform a in and out cath to collect some urine for a test, pt refused. Notified Courtney(RN)

## 2017-09-07 NOTE — ED Notes (Signed)
ED Provider at bedside. 

## 2017-09-07 NOTE — Progress Notes (Signed)
eLink Physician-Brief Progress Note Patient Name: [name] DOB: 1986/11/19 MRN: [id number]   Date of Service  09/07/2017  HPI/Events of Note  Notified by ED physician that patient remains acidotic with pH less than 7.0. Serum bicarbonate undetectable on chemistry. Reportedly patient more awake and alert per bedside physician.   eICU Interventions  1. Ordering bicarbonate infusion at 100 mL per hour 2. Continuing monitoring of electrolyte panel as per DKA order set      Intervention Category Major Interventions: Acid-Base disturbance - evaluation and management  Lawanda Cousins 09/07/2017, 8:00 PM

## 2017-09-07 NOTE — ED Notes (Signed)
Pt refused blood cultures and urine in and out, edp aware.

## 2017-09-08 ENCOUNTER — Encounter (HOSPITAL_COMMUNITY): Payer: Self-pay | Admitting: Emergency Medicine

## 2017-09-08 DIAGNOSIS — E0810 Diabetes mellitus due to underlying condition with ketoacidosis without coma: Secondary | ICD-10-CM

## 2017-09-08 DIAGNOSIS — E1010 Type 1 diabetes mellitus with ketoacidosis without coma: Secondary | ICD-10-CM

## 2017-09-08 LAB — BASIC METABOLIC PANEL
ANION GAP: 16 — AB (ref 5–15)
ANION GAP: 9 (ref 5–15)
BUN: 24 mg/dL — ABNORMAL HIGH (ref 6–20)
BUN: 21 mg/dL — ABNORMAL HIGH (ref 6–20)
CALCIUM: 8.1 mg/dL — AB (ref 8.9–10.3)
CALCIUM: 8.1 mg/dL — AB (ref 8.9–10.3)
CO2: 11 mmol/L — ABNORMAL LOW (ref 22–32)
CO2: 17 mmol/L — AB (ref 22–32)
CREATININE: 1.81 mg/dL — AB (ref 0.61–1.24)
CREATININE: 1.33 mg/dL — AB (ref 0.61–1.24)
Chloride: 106 mmol/L (ref 101–111)
Chloride: 110 mmol/L (ref 101–111)
GFR, EST AFRICAN AMERICAN: 55 mL/min — AB (ref >60)
GFR, EST NON AFRICAN AMERICAN: 48 mL/min — AB (ref >60)
GLUCOSE: 140 mg/dL — AB (ref 65–99)
Glucose, Bld: 269 mg/dL — ABNORMAL HIGH (ref 65–99)
Potassium: 3.9 mmol/L (ref 3.5–5.1)
Potassium: 3.5 mmol/L (ref 3.5–5.1)
Sodium: 133 mmol/L — ABNORMAL LOW (ref 135–145)
Sodium: 136 mmol/L (ref 135–145)

## 2017-09-08 LAB — GLUCOSE, CAPILLARY
GLUCOSE-CAPILLARY: 114 mg/dL — AB (ref 65–99)
GLUCOSE-CAPILLARY: 134 mg/dL — AB (ref 65–99)
GLUCOSE-CAPILLARY: 147 mg/dL — AB (ref 65–99)
GLUCOSE-CAPILLARY: 175 mg/dL — AB (ref 65–99)
GLUCOSE-CAPILLARY: 217 mg/dL — AB (ref 65–99)
GLUCOSE-CAPILLARY: 325 mg/dL — AB (ref 65–99)
GLUCOSE-CAPILLARY: 348 mg/dL — AB (ref 65–99)
Glucose-Capillary: 122 mg/dL — ABNORMAL HIGH (ref 65–99)
Glucose-Capillary: 134 mg/dL — ABNORMAL HIGH (ref 65–99)
Glucose-Capillary: 138 mg/dL — ABNORMAL HIGH (ref 65–99)
Glucose-Capillary: 195 mg/dL — ABNORMAL HIGH (ref 65–99)
Glucose-Capillary: 262 mg/dL — ABNORMAL HIGH (ref 65–99)
Glucose-Capillary: 336 mg/dL — ABNORMAL HIGH (ref 65–99)
Glucose-Capillary: 385 mg/dL — ABNORMAL HIGH (ref 65–99)

## 2017-09-08 LAB — MRSA PCR SCREENING: MRSA BY PCR: NEGATIVE

## 2017-09-08 MED ORDER — INSULIN GLARGINE 100 UNIT/ML ~~LOC~~ SOLN
10.0000 [IU] | Freq: Every day | SUBCUTANEOUS | Status: DC
  Administered 2017-09-08 – 2017-09-09 (×2): 10 [IU] via SUBCUTANEOUS
  Filled 2017-09-08 (×4): qty 0.1

## 2017-09-08 MED ORDER — INSULIN ASPART 100 UNIT/ML ~~LOC~~ SOLN
0.0000 [IU] | Freq: Three times a day (TID) | SUBCUTANEOUS | Status: DC
  Administered 2017-09-08: 2 [IU] via SUBCUTANEOUS
  Administered 2017-09-08: 3 [IU] via SUBCUTANEOUS
  Administered 2017-09-09: 11 [IU] via SUBCUTANEOUS
  Administered 2017-09-09: 8 [IU] via SUBCUTANEOUS
  Administered 2017-09-09: 5 [IU] via SUBCUTANEOUS
  Administered 2017-09-10: 11 [IU] via SUBCUTANEOUS
  Administered 2017-09-10: 2 [IU] via SUBCUTANEOUS

## 2017-09-08 MED ORDER — TRAZODONE HCL 150 MG PO TABS
150.0000 mg | ORAL_TABLET | Freq: Every evening | ORAL | Status: DC | PRN
  Administered 2017-09-08 – 2017-09-09 (×2): 150 mg via ORAL
  Filled 2017-09-08 (×3): qty 1

## 2017-09-08 MED ORDER — INSULIN ASPART 100 UNIT/ML ~~LOC~~ SOLN
0.0000 [IU] | Freq: Every day | SUBCUTANEOUS | Status: DC
  Administered 2017-09-08: 4 [IU] via SUBCUTANEOUS
  Administered 2017-09-09: 3 [IU] via SUBCUTANEOUS

## 2017-09-08 MED ORDER — ONDANSETRON HCL 4 MG/2ML IJ SOLN
4.0000 mg | Freq: Four times a day (QID) | INTRAMUSCULAR | Status: DC | PRN
  Administered 2017-09-08: 4 mg via INTRAVENOUS
  Filled 2017-09-08: qty 2

## 2017-09-08 MED ORDER — ACETAMINOPHEN 325 MG PO TABS
325.0000 mg | ORAL_TABLET | Freq: Four times a day (QID) | ORAL | Status: DC | PRN
  Administered 2017-09-08 – 2017-09-09 (×2): 325 mg via ORAL
  Filled 2017-09-08 (×3): qty 1

## 2017-09-08 MED ORDER — INSULIN ASPART 100 UNIT/ML ~~LOC~~ SOLN
3.0000 [IU] | Freq: Three times a day (TID) | SUBCUTANEOUS | Status: DC
  Administered 2017-09-08 – 2017-09-10 (×6): 3 [IU] via SUBCUTANEOUS

## 2017-09-08 NOTE — H&P (Signed)
PULMONARY / CRITICAL CARE MEDICINE   Name: [name] MRN: [id number] DOB: July 15, 1986    ADMISSION DATE:  09/07/2017 CONSULTATION DATE:  09/07/2017  REFERRING MD:  Dr. Madilyn Hook  CHIEF COMPLAINT:  DKA  HISTORY OF PRESENT ILLNESS:   31 year old male with type 1 diabetes mellitus and frequent recent admissions for DKA. He was recently discharged on 9/3 for this. His friend called EMS on 9/21 for AMS and the patient turned EMS at the door twice. On the third call he was obtunded and EMS transported him to ED, where POC glucose was found to be > 700 with venous pH of 6.9. He was started on IVF and insulin infusion. PCCM asked to admit.   SUBJECTIVE:  Sleeping On insulin drip  VITAL SIGNS: BP (!) 144/80   Pulse (!) 126   Temp 98.7 F (37.1 C) (Axillary)   Resp 16   Ht 6\' 1"  (1.854 m)   Wt 78.1 kg (172 lb 2.9 oz)   SpO2 99%   BMI 22.72 kg/m   HEMODYNAMICS:    VENTILATOR SETTINGS: FiO2 (%):  [2 %] 2 %  INTAKE / OUTPUT: I/O last 3 completed shifts: In: 4891.2 [I.V.:1920.2; IV Piggyback:2971] Out: 2900 [Urine:2900]  PHYSICAL EXAMINATION: General: awakens from sleep, will converse Neuro: nonfocal HEENT: jvd low PULM: CTA no ronchi CV: s1 s2 rr tachy mild GI: soft, BS wnl, no r Extremities: diabetic ulcer foot erythema clean base   LABS:  BMET  Recent Labs Lab 09/07/17 1705 09/08/17 0225 09/08/17 0635  NA 117* 133* 136  K 6.6* 3.9 3.5  CL 76* 106 110  CO2 <7* 11* 17*  BUN 28* 24* 21*  CREATININE 3.09* 1.81* 1.33*  GLUCOSE 1,046* 269* 140*    Electrolytes  Recent Labs Lab 09/07/17 1705 09/08/17 0225 09/08/17 0635  CALCIUM 8.8* 8.1* 8.1*    CBC  Recent Labs Lab 09/07/17 1705  WBC 36.1*  HGB 11.7*  HCT 39.0  PLT 573*    Coag's No results for input(s): APTT, INR in the last 168 hours.  Sepsis Markers  Recent Labs Lab 09/07/17 1716  LATICACIDVEN 4.09*    ABG No results for input(s): PHART, PCO2ART, PO2ART in the last 168  hours.  Liver Enzymes  Recent Labs Lab 09/07/17 1705  AST 18  ALT 18  ALKPHOS 154*  BILITOT 2.9*  ALBUMIN 4.1    Cardiac Enzymes No results for input(s): TROPONINI, PROBNP in the last 168 hours.  Glucose  Recent Labs Lab 09/08/17 0244 09/08/17 0403 09/08/17 0458 09/08/17 0554 09/08/17 0657 09/08/17 0809  GLUCAP 217* 195* 175* 147* 134* 122*    Imaging Dg Chest Port 1 View  Result Date: 09/07/2017 CLINICAL DATA:  Altered mental status. EXAM: PORTABLE CHEST 1 VIEW COMPARISON:  08/07/2017, 08/06/2017 and 06/03/2017 FINDINGS: Lungs are adequately inflated without consolidation or effusion. Cardiomediastinal silhouette and remainder the exam is unchanged. IMPRESSION: No active disease. Electronically Signed   By: 06/05/2017 M.D.   On: 09/07/2017 17:54    STUDIES:    CULTURES: Blood 9/21 >  ANTIBIOTICS: Zosyn 9/21 > Vancomycin 9/21 >  SIGNIFICANT EVENTS: 9/21 admit dka HONK  LINES/TUBES: 20G and 22G PIV's  DISCUSSION: 31 year old diabetic with history of non-compliance admitted for DKA 9/21.  ASSESSMENT / PLAN:  DKA, GAP closed, remains with small NONAG, rule out type 4 RTA - transition off drip to lantus and SSI -maintain bicarb for non AG, chem in pm and am -NPO, diet when off drip  and able -when on diet then add meal coverage  SIRS possibly sepsis secondary to L foot wound.  - vancomycin, zoysn -  wound care -likley can dc vanc , follow any cultures we can get   Hypothermia. - likely from DKA, corrected  Acute renal failure. - hypovolemia, atn -chem in pm and am  -bicarb only for NON AG   Anemia of critical illness. - f/u CBC in am firther for wbc -ensure sub q hep  Elevated bilirubin. - Abd exam benign, follow trend, may need fractionation - f/u LFT  Ccm time 30 min  Mcarthur Rossetti. Tyson Alias, MD, FACP Pgr: [phone] [facility]

## 2017-09-08 NOTE — Progress Notes (Signed)
eLink Physician-Brief Progress Note Patient Name: [name] DOB: 03-Jan-1986 MRN: [id number]   Date of Service  09/08/2017  HPI/Events of Note  Insomnia, takes trazadon 150 mg PO QHS  eICU Interventions  Trazdon ordered as above     Intervention Category Major Interventions: Other:  [name] 09/08/2017, 11:01 PM

## 2017-09-08 NOTE — Progress Notes (Signed)
Notified MD about elevated HR 125-130 BP 113/55. Patient request for juice was denied due to elevated CBG and patient is mad water and diet soda offered and refused.  He is refusing lab work and medications. MD aware of this.

## 2017-09-08 NOTE — Progress Notes (Signed)
eLink Physician-Brief Progress Note Patient Name: [name] DOB: 01-12-1986 MRN: [id number]   Date of Service  09/08/2017  HPI/Events of Note  Multiple issues: 1. Nausea and vomiting and 2. Hypothermia. QTc  Interval = 400 milliseconds.   eICU Interventions  Will order: 1. [employer]. 2. Zofran 4 mg IV Q 6 hours PRN N/V.     Intervention Category Major Interventions: Other:  Lexie Koehl Dennard Nip 09/08/2017, 3:06 AM

## 2017-09-08 NOTE — Progress Notes (Signed)
Pt refusing protonix and vancomycin at this time.  Pt states "I do not want anything."  RN asked patient "Do you want your night time protonix and vancomycin?"  Pt states "NO."  Pt refusing all care by nursing staff at this time.  Karlene RN to notify MD of pt refusal of care.

## 2017-09-09 DIAGNOSIS — E10621 Type 1 diabetes mellitus with foot ulcer: Secondary | ICD-10-CM

## 2017-09-09 DIAGNOSIS — L97421 Non-pressure chronic ulcer of left heel and midfoot limited to breakdown of skin: Secondary | ICD-10-CM

## 2017-09-09 LAB — CBC WITH DIFFERENTIAL/PLATELET
BASOS ABS: 0 10*3/uL (ref 0.0–0.1)
Basophils Relative: 0 %
Eosinophils Absolute: 0 10*3/uL (ref 0.0–0.7)
Eosinophils Relative: 0 %
HEMATOCRIT: 23.7 % — AB (ref 39.0–52.0)
Hemoglobin: 8.4 g/dL — ABNORMAL LOW (ref 13.0–17.0)
LYMPHS ABS: 1.2 10*3/uL (ref 0.7–4.0)
LYMPHS PCT: 13 %
MCH: 29.9 pg (ref 26.0–34.0)
MCHC: 35.4 g/dL (ref 30.0–36.0)
MCV: 84.3 fL (ref 78.0–100.0)
MONO ABS: 0.6 10*3/uL (ref 0.1–1.0)
Monocytes Relative: 7 %
NEUTROS ABS: 7.2 10*3/uL (ref 1.7–7.7)
Neutrophils Relative %: 80 %
Platelets: 251 10*3/uL (ref 150–400)
RBC: 2.81 MIL/uL — AB (ref 4.22–5.81)
RDW: 14.9 % (ref 11.5–15.5)
WBC: 8.9 10*3/uL (ref 4.0–10.5)

## 2017-09-09 LAB — BASIC METABOLIC PANEL
ANION GAP: 14 (ref 5–15)
BUN: 11 mg/dL (ref 6–20)
CHLORIDE: 103 mmol/L (ref 101–111)
CO2: 21 mmol/L — AB (ref 22–32)
CREATININE: 1.23 mg/dL (ref 0.61–1.24)
Calcium: 8 mg/dL — ABNORMAL LOW (ref 8.9–10.3)
GFR calc non Af Amer: >60 mL/min (ref >60)
Glucose, Bld: 310 mg/dL — ABNORMAL HIGH (ref 65–99)
POTASSIUM: 3.1 mmol/L — AB (ref 3.5–5.1)
SODIUM: 138 mmol/L (ref 135–145)

## 2017-09-09 LAB — GLUCOSE, CAPILLARY
Glucose-Capillary: 332 mg/dL — ABNORMAL HIGH (ref 65–99)
Glucose-Capillary: 331 mg/dL — ABNORMAL HIGH (ref 65–99)
Glucose-Capillary: 275 mg/dL — ABNORMAL HIGH (ref 65–99)

## 2017-09-09 MED ORDER — AMPHETAMINE-DEXTROAMPHET ER 10 MG PO CP24
30.0000 mg | ORAL_CAPSULE | Freq: Every day | ORAL | Status: DC
  Administered 2017-09-09 – 2017-09-10 (×2): 30 mg via ORAL
  Filled 2017-09-09 (×2): qty 3

## 2017-09-09 MED ORDER — SODIUM CHLORIDE 0.9 % IV SOLN
INTRAVENOUS | Status: DC
  Administered 2017-09-09 (×2): via INTRAVENOUS

## 2017-09-09 MED ORDER — TRAMADOL HCL 50 MG PO TABS
50.0000 mg | ORAL_TABLET | Freq: Four times a day (QID) | ORAL | Status: DC | PRN
  Administered 2017-09-09 – 2017-09-10 (×3): 50 mg via ORAL
  Filled 2017-09-09 (×3): qty 1

## 2017-09-09 MED ORDER — DOXEPIN HCL 10 MG/ML PO CONC
10.0000 mg | Freq: Every day | ORAL | Status: DC
  Administered 2017-09-09: 10 mg via ORAL
  Filled 2017-09-09 (×3): qty 1

## 2017-09-09 MED ORDER — POTASSIUM CHLORIDE 20 MEQ PO PACK
40.0000 meq | PACK | ORAL | Status: AC
  Administered 2017-09-09 (×2): 40 meq via ORAL
  Filled 2017-09-09 (×3): qty 2

## 2017-09-09 NOTE — Progress Notes (Signed)
PROGRESS NOTE    [name]  FWY:[id number] DOB: [birth year] DOA: 09/07/2017 PCP: Fleet Contras, MD    Brief Narrative:  31 year old male who presented with diabetes ketoacidosis. Patient has significant history of type 1 diabetes mellitus, patient developed worsening hyperglycemia, he was found obtunded by EMS. I then his capillary glucose was greater than 700. On initial physical examination blood pressure 93/49, heart rate 101, temperature 93.1, respiratory 22, oxygen saturation 100%. Dry mucous membranes, lungs were clear to auscultation bilaterally, no wheezes rales or rhonchi, heart S1-S2 present tachycardic, no gallops, rubs or murmurs, abdomen was soft and nontender, nondistended, no lower extremity edema. Sodium 117, potassium 6.6, chloride 76, bicarbonate less than 7, glucose 1046, BUN 28, creatinine 3.09, calcium 8.8, anion gap greater than 35, white count 36.1, hemoglobin 11.7, hematocrit 39.0, platelets 573, chest x-ray with left rotation, good inspiration, good penetration, no infiltrates, effusions or pneumothorax. EKG with sinus tachycardia, left axis deviation, no ST elevations or ST depressions. Urine drug screen negative.  Patient was admitted to the hospital with working diagnosis of diabetes ketoacidosis.   Assessment & Plan:   Active Problems:   DKA (diabetic ketoacidoses) ([name])   1. Diabetes ketoacidosis. Anion gap has been closed, patient tolerating well po diet, besides mild nausea, will continue glucose cover and monitoring with insulin sliding scale, will continue basal insulin with 10 units of glargine, will continue to calculate the insulin requirements. Patient non compliant with insulin therapy.   2. Acute kidney injury with psudohyponatremia. Renal function with improving with serum cr down to 1,23, from, peak at 3,0, will continue gentle hydration with saline. Pseudohyponatremia due to hyperglycemia, Na up to 138 with correction of hyperglycemia, will  continue to replete K with po kcl and will follow on renal panel in am, avoid nephrotoxic medications and hypotension.   3. Metabolic encephalopathy. Clinically has improved, this am with mild somnolence, will continue supportive medical therapy and correction of hyperglycemia.  4. Left wound ulcer. Clean base with no erythema and edema, will hold on systemic antibiotic therapy and will continue local wound care. Pain control with tramadol.   5. Depression and anxiety. Continue doxepin, trazodone and adderall per home regimen.    DVT prophylaxis: heparin  Code Status: Full Family Communication:  Disposition Plan: Home   Consultants:     Procedures:     Antimicrobials:       Subjective: Patient feeling well, still having nausea and generalized pain, no chest pain or dyspnea. Able to tolerate po liquids.   Objective: Vitals:   09/08/17 1645 09/08/17 1945 09/08/17 2350 09/09/17 0335  BP: (!) 143/81 (!) 114/55 132/78 121/65  Pulse: (!) 134 (!) 126 (!) 124 (!) 122  Resp: (!) 26 16 (!) 21 20  Temp: 98.5 F (36.9 C) 98.8 F (37.1 C) 98.9 F (37.2 C) 99 F (37.2 C)  TempSrc: Oral Oral Oral Oral  SpO2: 100% 99% 98% 100%  Weight:      Height: 6\' 1"  (1.854 m)       Intake/Output Summary (Last 24 hours) at 09/09/17 0827 Last data filed at 09/09/17 0741  Gross per 24 hour  Intake          2087.23 ml  Output             2425 ml  Net          -337.77 ml   Filed Weights   09/07/17 1700 09/07/17 2027  Weight: 90.7 kg (200 lb) 78.1 kg (172 lb  2.9 oz)    Examination:  General: deconditioned and ill looking appearing Neurology: Somnolent, non focal  E ENT: mild pallor, no icterus, oral mucosa dry Cardiovascular: No JVD. S1-S2 present, rhythmic, no gallops, rubs, or murmurs. No lower extremity edema. Pulmonary: vesicular breath sounds bilaterally, adequate air movement, no wheezing, rhonchi or rales. Gastrointestinal. Abdomen flat, no organomegaly, non tender, no rebound  or guarding Skin. left heal ulcer, 3 to 4 cm in diameter, clean base, no associated erythema or edema.  Musculoskeletal: no joint deformities     Data Reviewed: I have personally reviewed following labs and imaging studies  CBC:  Recent Labs Lab 09/07/17 1705 09/09/17 0603  WBC 36.1* 8.9  NEUTROABS 31.8* 7.2  HGB 11.7* 8.4*  HCT 39.0 23.7*  MCV 98.0 84.3  PLT 573* 251   Basic Metabolic Panel:  Recent Labs Lab 09/07/17 1705 09/08/17 0225 09/08/17 0635 09/09/17 0325  NA 117* 133* 136 138  K 6.6* 3.9 3.5 3.1*  CL 76* 106 110 103  CO2 <7* 11* 17* 21*  GLUCOSE 1,046* 269* 140* 310*  BUN 28* 24* 21* 11  CREATININE 3.09* 1.81* 1.33* 1.23  CALCIUM 8.8* 8.1* 8.1* 8.0*   GFR: Estimated Creatinine Clearance: 95.2 mL/min (by C-G formula based on SCr of 1.23 mg/dL). Liver Function Tests:  Recent Labs Lab 09/07/17 1705  AST 18  ALT 18  ALKPHOS 154*  BILITOT 2.9*  PROT 6.9  ALBUMIN 4.1    Recent Labs Lab 09/07/17 1705  LIPASE 162*   No results for input(s): AMMONIA in the last 168 hours. Coagulation Profile: No results for input(s): INR, PROTIME in the last 168 hours. Cardiac Enzymes: No results for input(s): CKTOTAL, CKMB, CKMBINDEX, TROPONINI in the last 168 hours. BNP (last 3 results) No results for input(s): PROBNP in the last 8760 hours. HbA1C: No results for input(s): HGBA1C in the last 72 hours. CBG:  Recent Labs Lab 09/08/17 0919 09/08/17 1025 09/08/17 1149 09/08/17 1942 09/08/17 2344  GLUCAP 114* 138* 134* 336* 262*   Lipid Profile: No results for input(s): CHOL, HDL, LDLCALC, TRIG, CHOLHDL, LDLDIRECT in the last 72 hours. Thyroid Function Tests: No results for input(s): TSH, T4TOTAL, FREET4, T3FREE, THYROIDAB in the last 72 hours. Anemia Panel: No results for input(s): VITAMINB12, FOLATE, FERRITIN, TIBC, IRON, RETICCTPCT in the last 72 hours.    Radiology Studies: I have reviewed all of the imaging during this hospital visit  personally     Scheduled Meds: . heparin  5,000 Units Subcutaneous Q8H  . insulin aspart  0-15 Units Subcutaneous TID WC  . insulin aspart  0-5 Units Subcutaneous QHS  . insulin aspart  3 Units Subcutaneous TID WC  . insulin glargine  10 Units Subcutaneous Daily  . pantoprazole (PROTONIX) IV  40 mg Intravenous Q24H   Continuous Infusions: . piperacillin-tazobactam (ZOSYN)  IV Stopped (09/09/17 0359)  .  sodium bicarbonate (isotonic) infusion in sterile water 100 mL/hr at 09/09/17 0151  . vancomycin       LOS: 2 days        Coralie Keens, MD [facility] Pager [phone]

## 2017-09-10 DIAGNOSIS — S91302A Unspecified open wound, left foot, initial encounter: Secondary | ICD-10-CM

## 2017-09-10 DIAGNOSIS — E876 Hypokalemia: Secondary | ICD-10-CM

## 2017-09-10 LAB — CBC WITH DIFFERENTIAL/PLATELET
BASOS ABS: 0 10*3/uL (ref 0.0–0.1)
BASOS PCT: 0 %
Eosinophils Absolute: 0.1 10*3/uL (ref 0.0–0.7)
Eosinophils Relative: 1 %
HEMATOCRIT: 27.1 % — AB (ref 39.0–52.0)
HEMOGLOBIN: 9 g/dL — AB (ref 13.0–17.0)
Lymphocytes Relative: 27 %
Lymphs Abs: 1.4 10*3/uL (ref 0.7–4.0)
MCH: 28.7 pg (ref 26.0–34.0)
MCHC: 33.2 g/dL (ref 30.0–36.0)
MCV: 86.3 fL (ref 78.0–100.0)
Monocytes Absolute: 0.4 10*3/uL (ref 0.1–1.0)
Monocytes Relative: 9 %
NEUTROS ABS: 3.2 10*3/uL (ref 1.7–7.7)
NEUTROS PCT: 63 %
Platelets: 211 10*3/uL (ref 150–400)
RBC: 3.14 MIL/uL — ABNORMAL LOW (ref 4.22–5.81)
RDW: 14.4 % (ref 11.5–15.5)
WBC: 5 10*3/uL (ref 4.0–10.5)

## 2017-09-10 LAB — BASIC METABOLIC PANEL
ANION GAP: 5 (ref 5–15)
Anion gap: 5 (ref 5–15)
BUN: 6 mg/dL (ref 6–20)
BUN: <5 mg/dL — ABNORMAL LOW (ref 6–20)
CALCIUM: 8.3 mg/dL — AB (ref 8.9–10.3)
CALCIUM: 8.4 mg/dL — AB (ref 8.9–10.3)
CHLORIDE: 102 mmol/L (ref 101–111)
CHLORIDE: 101 mmol/L (ref 101–111)
CO2: 30 mmol/L (ref 22–32)
CO2: 32 mmol/L (ref 22–32)
Creatinine, Ser: 0.7 mg/dL (ref 0.61–1.24)
Creatinine, Ser: 0.59 mg/dL — ABNORMAL LOW (ref 0.61–1.24)
GFR calc Af Amer: >60 mL/min (ref >60)
GFR calc non Af Amer: >60 mL/min (ref >60)
GFR calc non Af Amer: >60 mL/min (ref >60)
GLUCOSE: 119 mg/dL — AB (ref 65–99)
Glucose, Bld: 306 mg/dL — ABNORMAL HIGH (ref 65–99)
Potassium: 3.7 mmol/L (ref 3.5–5.1)
Potassium: 3.3 mmol/L — ABNORMAL LOW (ref 3.5–5.1)
Sodium: 137 mmol/L (ref 135–145)
Sodium: 138 mmol/L (ref 135–145)

## 2017-09-10 LAB — GLUCOSE, CAPILLARY
GLUCOSE-CAPILLARY: 337 mg/dL — AB (ref 65–99)
Glucose-Capillary: 284 mg/dL — ABNORMAL HIGH (ref 65–99)
Glucose-Capillary: 328 mg/dL — ABNORMAL HIGH (ref 65–99)
Glucose-Capillary: 123 mg/dL — ABNORMAL HIGH (ref 65–99)

## 2017-09-10 LAB — MAGNESIUM: Magnesium: 1.8 mg/dL (ref 1.7–2.4)

## 2017-09-10 MED ORDER — INSULIN DETEMIR 100 UNIT/ML ~~LOC~~ SOLN: 30.0000 [IU] | Freq: Every day | SUBCUTANEOUS | 0 refills | Status: DC

## 2017-09-10 MED ORDER — ACETAMINOPHEN 500 MG PO TABS: 500.0000 mg | ORAL_TABLET | Freq: Four times a day (QID) | ORAL | 0 refills | Status: DC | PRN

## 2017-09-10 MED ORDER — COLLAGENASE 250 UNIT/GM EX OINT: TOPICAL_OINTMENT | CUTANEOUS | 0 refills | Status: DC

## 2017-09-10 MED ORDER — INSULIN ASPART 100 UNIT/ML FLEXPEN: 0.0000 [IU] | PEN_INJECTOR | SUBCUTANEOUS | 0 refills | Status: DC

## 2017-09-10 MED ORDER — POTASSIUM CHLORIDE ER 10 MEQ PO TBCR: 10.0000 meq | EXTENDED_RELEASE_TABLET | Freq: Every day | ORAL | 0 refills | Status: DC

## 2017-09-10 MED ORDER — INSULIN STARTER KIT- PEN NEEDLES (ENGLISH): 1.0000 | Freq: Once | 0 refills | Status: AC

## 2017-09-10 MED ORDER — BLOOD GLUCOSE MONITOR KIT: PACK | 0 refills | Status: DC

## 2017-09-10 MED ORDER — INSULIN STARTER KIT- PEN NEEDLES (ENGLISH)
1.0000 | Freq: Once | Status: AC
  Administered 2017-09-10: 1
  Filled 2017-09-10: qty 1

## 2017-09-10 MED ORDER — PANTOPRAZOLE SODIUM 40 MG PO TBEC: 40.0000 mg | DELAYED_RELEASE_TABLET | Freq: Every day | ORAL | Status: DC

## 2017-09-10 MED ORDER — INSULIN GLARGINE 100 UNIT/ML ~~LOC~~ SOLN
30.0000 [IU] | Freq: Every day | SUBCUTANEOUS | Status: DC
  Administered 2017-09-10: 30 [IU] via SUBCUTANEOUS
  Filled 2017-09-10: qty 0.3

## 2017-09-10 MED ORDER — KETOROLAC TROMETHAMINE 15 MG/ML IJ SOLN
15.0000 mg | Freq: Three times a day (TID) | INTRAMUSCULAR | Status: DC | PRN
  Administered 2017-09-10: 15 mg via INTRAVENOUS
  Filled 2017-09-10: qty 1

## 2017-09-10 NOTE — Consult Note (Addendum)
WOC Nurse wound consult note Reason for Consult: Consult requested for left heel. Pt is familiar to [facility] team from previous admissions; he developed an unstageable pressure injury when unresponsive at home about a month ago and was using Santyl after discharge for enzymatic debridement.   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  Reviewed topical treatment plans with neosporin to promote moist healing and gauze and kerlex to protect.  Pt verbalized understanding. Please re-consult if further assistance is needed.  Thank-you,  Cammie Mcgee MSN, RN, CWOCN, [place], CNS [phone]

## 2017-09-10 NOTE — Care Management Note (Signed)
Case Management Note  Patient Details  Name: [name] MRN: [id number] Date of Birth: August 03, 1986  Subjective/Objective:                    Action/Plan: CM consulted for assistance with his insulin and blood glucose kit. Patient has insurance. CM met with the patient and he is interested in having his medications sent to [facility] for assistance with the cost. CM informed the MD.  Pt states he is receiving a blood glucose machine, strips, lancets from [facility]. Patient checked on delivery of the machine and supplies and they should be at his home today.  Pt states he has transportation to [facility] pharmacy and home today.   Expected Discharge Date:  09/10/17               Expected Discharge Plan:  Home/Self Care  In-House Referral:     Discharge planning Services  CM Consult, Medication Assistance  Post Acute Care Choice:    Choice offered to:     DME Arranged:    DME Agency:     HH Arranged:    HH Agency:     Status of Service:  Completed, signed off  If discussed at [employer] of Stay Meetings, dates discussed:    Additional Comments:  Pollie Friar, RN 09/10/2017, 2:45 PM

## 2017-09-10 NOTE — Discharge Summary (Addendum)
Physician Discharge Summary  [name] SEG:[id number] DOB: 26-Jul-1986 DOA: 09/07/2017  PCP: Nolene Ebbs, MD  Admit date: 09/07/2017 Discharge date: 09/10/2017  Admitted From:home Disposition:home  Recommendations for Outpatient Follow-up:  1. Follow up with PCP in 1-2 weeks 2. Please obtain BMP/CBC in one week  Home Health:no Equipment/Devices:no Discharge Condition:stable CODE STATUS:full code Diet recommendation:carb modified heart healthy diet  Brief/Interim Summary: 31 year old male with history of type 1 diabetes, multiple prior admissions for DKA, admitted by pulmonary critical care for severe diabetic ketoacidosis with pH of 6.9 on admission in the setting of noncompliance with insulin. Patient was treated with IV fluid, insulin and electrolytes replacement with improvement in blood sugar level and closing. Patient was transferred to try hospitalist group. Wound care nurse evaluated the left heel open wound, most likely pressure ulcer vs diabetic ulcer. Patient with hypokalemia, repleted potassium chloride. Patient denied nausea vomiting chest pain or shortness of breath. No abdominal pain. Tolerating diet well. Education provided to the patient regarding importance of insulin and monitoring blood sugar level.  Patient also developed hyponatremia, acute kidney injury which improved on discharge. Acute metabolic encephalopathy improved on discharge Left heel pressure injury, stage 3 ulcer: Wound care clinic referral was made on discharge.  Recommended follow up with PCP. She verbalized understanding. At this time, patient is medically stable to discharge. Discussed with the social worker and nursing staffs on discharge.  Discharge Diagnoses:  Active Problems:   DKA (diabetic ketoacidoses) ([name])   Hypokalemia   Non healing left heel wound    Discharge Instructions  Discharge Instructions    Ambulatory referral to Wound Clinic    Complete by:  As directed    Call  MD for:  difficulty breathing, headache or visual disturbances    Complete by:  As directed    Call MD for:  extreme fatigue    Complete by:  As directed    Call MD for:  hives    Complete by:  As directed    Call MD for:  persistant dizziness or light-headedness    Complete by:  As directed    Call MD for:  persistant nausea and vomiting    Complete by:  As directed    Call MD for:  severe uncontrolled pain    Complete by:  As directed    Call MD for:  temperature >100.4    Complete by:  As directed    Diet - low sodium heart healthy    Complete by:  As directed    Diet Carb Modified    Complete by:  As directed    Discharge instructions    Complete by:  As directed    Follow up with PCP   Increase activity slowly    Complete by:  As directed      Allergies as of 09/10/2017      Reactions   Sulfa Antibiotics Other (See Comments)   Unknown childhood allergy   Fentanyl Other (See Comments)   Patient had hallucinations with the medication      Medication List    TAKE these medications   acetaminophen 500 MG tablet Commonly known as:  TYLENOL Take 1 tablet (500 mg total) by mouth every 6 (six) hours as needed.   amphetamine-dextroamphetamine 30 MG tablet Commonly known as:  ADDERALL Take 30 mg by mouth daily.   blood glucose meter kit and supplies Kit Dispense based on patient and insurance preference. Use up to four times daily as directed. (FOR ICD-9  250.00, 250.01).   collagenase ointment Commonly known as:  SANTYL Apply Santyl to left heel wound Q day, then cover with moist gauze and 4X4 and kerlex   doxepin 10 MG/ML solution Commonly known as:  SINEQUAN Take 10 mLs by mouth at bedtime.   insulin aspart 100 UNIT/ML FlexPen Commonly known as:  NOVOLOG FLEXPEN Inject 0-15 Units into the skin See admin instructions. Check Blood Sugar 4 times per day > with meals and at bedtime CBG 70 - 120: 0 units CBG 121 - 150: 2 units CBG 151 - 200: 3 units CBG 201 - 250: 5  units CBG 251 - 300: 8 units CBG 301 - 350: 11 units CBG 351 - 400: 15 units What changed:  how much to take  how to take this  when to take this   insulin detemir 100 UNIT/ML injection Commonly known as:  LEVEMIR Inject 0.3 mLs (30 Units total) into the skin daily.   insulin starter kit- pen needles Misc 1 kit by Other route once.   metoCLOPramide 10 MG tablet Commonly known as:  REGLAN Take 10 mg by mouth 3 (three) times daily as needed. stomach   pantoprazole 40 MG tablet Commonly known as:  PROTONIX Take 1 tablet (40 mg total) by mouth daily.   potassium chloride 10 MEQ tablet Commonly known as:  K-DUR Take 1 tablet (10 mEq total) by mouth daily.   sennosides-docusate sodium 8.6-50 MG tablet Commonly known as:  SENOKOT-S Take 1 tablet by mouth as needed for constipation.   traZODone 150 MG tablet Commonly known as:  DESYREL Take 150 mg by mouth at bedtime.            Discharge Care Instructions        Start     Ordered   09/10/17 0000  Increase activity slowly     09/10/17 1416   09/10/17 0000  Diet - low sodium heart healthy     09/10/17 1416   09/10/17 0000  Discharge instructions    Comments:  Follow up with PCP   09/10/17 1416   09/10/17 0000  Diet Carb Modified     09/10/17 1416   09/10/17 0000  Call MD for:  temperature >100.4     09/10/17 1416   09/10/17 0000  Call MD for:  persistant nausea and vomiting     09/10/17 1416   09/10/17 0000  Call MD for:  severe uncontrolled pain     09/10/17 1416   09/10/17 0000  Call MD for:  difficulty breathing, headache or visual disturbances     09/10/17 1416   09/10/17 0000  Call MD for:  hives     09/10/17 1416   09/10/17 0000  Call MD for:  persistant dizziness or light-headedness     09/10/17 1416   09/10/17 0000  Call MD for:  extreme fatigue     09/10/17 1416   09/10/17 0000  blood glucose meter kit and supplies KIT    Question Answer Comment  Number of strips 50   Number of lancets 50       09/10/17 1417   09/10/17 0000  collagenase (SANTYL) ointment     09/10/17 1417   09/10/17 0000  insulin aspart (NOVOLOG FLEXPEN) 100 UNIT/ML FlexPen  See admin instructions     09/10/17 1417   09/10/17 0000  insulin detemir (LEVEMIR) 100 UNIT/ML injection  Daily     09/10/17 1417   09/10/17 0000  acetaminophen (TYLENOL) 500 MG  tablet  Every 6 hours PRN     09/10/17 1417   09/10/17 0000  insulin starter kit- pen needles MISC   Once     09/10/17 1417   09/10/17 0000  potassium chloride (K-DUR) 10 MEQ tablet  Daily     09/10/17 1420   09/10/17 0000  Ambulatory referral to Wound Clinic     09/10/17 1427     Follow-up Information    Nolene Ebbs, MD. Go on 09/20/2017.   Specialty:  Internal Medicine Why:  Please follow-up with Dr. Jeanie Cooks on Thursday, October 4th, 2018 at 11:00am. Contact information: [place] [address] [phone]        [place]             . Go on 10/09/2017.   Why:  Please follow-up with [facility] on Tuesday, October 23rd, 2018 at 8:45am for left heel wound care. Arrive 15 minutes early to complete necessary paperwork. Contact information: [address] [phone]         Allergies  Allergen Reactions  . Sulfa Antibiotics Other (See Comments)    Unknown childhood allergy  . Fentanyl Other (See Comments)    Patient had hallucinations with the medication    Consultations: Admitted by pulmonary critical care  Procedures/Studies: None  Subjective: Seen and examined at bedside. Denied headache, dizziness, nausea vomiting chest pain or shortness of breath.  Discharge Exam: Vitals:   09/10/17 0714 09/10/17 1220  BP: (!) 139/108 (!) 133/97  Pulse:  98  Resp: 15 12  Temp: 97.6 F (36.4 C) 97.9 F (36.6 C)  SpO2: 99% 99%   Vitals:   09/09/17 2310 09/10/17 0525 09/10/17 0714 09/10/17 1220  BP: (!) 139/102 (!) 143/93 (!)  139/108 (!) 133/97  Pulse: (!) 102 91  98  Resp: (!) _0 Temp: 98.7 F (37.1 C) 98.7 F (37.1 C) 97.6 F (36.4 C) 97.9 F (36.6 C)  TempSrc: Oral Oral  Oral  SpO2: 100% 94% 99% 99%  Weight:      Height:        General: Pt is alert, awake, not in acute distress Cardiovascular: RRR, S1/S2 +, no rubs, no gallops Respiratory: CTA bilaterally, no wheezing, no rhonchi Abdominal: Soft, NT, ND, bowel sounds + Extremities: no edema, no cyanosis. Left heel has superficial wound    The results of significant diagnostics from this hospitalization (including imaging, microbiology, ancillary and laboratory) are listed below for reference.     Microbiology: Recent Results (from the past 240 hour(s))  MRSA PCR Screening     Status: None   Collection Time: 09/07/17  9:20 PM  Result Value Ref Range Status   MRSA by PCR NEGATIVE NEGATIVE Final    Comment:        The GeneXpert MRSA Assay (FDA approved for NASAL specimens only), is one component of a comprehensive MRSA colonization surveillance program. It is not intended to diagnose MRSA infection nor to guide or monitor treatment for MRSA infections.      Labs: BNP (last 3 results) No results for input(s): BNP in the last 8760 hours. Basic Metabolic Panel:  Recent Labs Lab 09/08/17 0225 09/08/17 0635 09/09/17 0325 09/10/17 0227 09/10/17 1253  NA 133* 136 138 137 138  K 3.9 3.5 3.1* 3.7 3.3*  CL 106 110 103 102 101  CO2 11* 17* 21* 30 32  GLUCOSE 269* 140* 310*  306* 119*  BUN 24* 21* 11 6 <5*  CREATININE 1.81* 1.33* 1.23 0.70 0.59*  CALCIUM 8.1* 8.1* 8.0* 8.3* 8.4*  MG  --   --   --   --  1.8   Liver Function Tests:  Recent Labs Lab 09/07/17 1705  AST 18  ALT 18  ALKPHOS 154*  BILITOT 2.9*  PROT 6.9  ALBUMIN 4.1    Recent Labs Lab 09/07/17 1705  LIPASE 162*   No results for input(s): AMMONIA in the last 168 hours. CBC:  Recent Labs Lab 09/07/17 1705 09/09/17 0603 09/10/17 0227  WBC  36.1* 8.9 5.0  NEUTROABS 31.8* 7.2 3.2  HGB 11.7* 8.4* 9.0*  HCT 39.0 23.7* 27.1*  MCV 98.0 84.3 86.3  PLT 573* 251 211   Cardiac Enzymes: No results for input(s): CKTOTAL, CKMB, CKMBINDEX, TROPONINI in the last 168 hours. BNP: Invalid input(s): POCBNP CBG:  Recent Labs Lab 09/09/17 1606 09/09/17 2100 09/10/17 0528 09/10/17 0825 09/10/17 1249  GLUCAP 331* 275* 328* 337* 123*   D-Dimer No results for input(s): DDIMER in the last 72 hours. Hgb A1c No results for input(s): HGBA1C in the last 72 hours. Lipid Profile No results for input(s): CHOL, HDL, LDLCALC, TRIG, CHOLHDL, LDLDIRECT in the last 72 hours. Thyroid function studies No results for input(s): TSH, T4TOTAL, T3FREE, THYROIDAB in the last 72 hours.  Invalid input(s): FREET3 Anemia work up No results for input(s): VITAMINB12, FOLATE, FERRITIN, TIBC, IRON, RETICCTPCT in the last 72 hours. Urinalysis    Component Value Date/Time   COLORURINE STRAW (A) 09/07/2017 2041   APPEARANCEUR CLEAR 09/07/2017 2041   LABSPEC 1.013 09/07/2017 2041   PHURINE 5.0 09/07/2017 2041   GLUCOSEU >=500 (A) 09/07/2017 2041   HGBUR SMALL (A) 09/07/2017 2041   BILIRUBINUR NEGATIVE 09/07/2017 2041   BILIRUBINUR negative 03/29/2015 1114   KETONESUR 80 (A) 09/07/2017 2041   PROTEINUR 30 (A) 09/07/2017 2041   UROBILINOGEN 0.2 05/17/2015 1810   NITRITE NEGATIVE 09/07/2017 2041   LEUKOCYTESUR NEGATIVE 09/07/2017 2041   Sepsis Labs Invalid input(s): PROCALCITONIN,  WBC,  LACTICIDVEN Microbiology Recent Results (from the past 240 hour(s))  MRSA PCR Screening     Status: None   Collection Time: 09/07/17  9:20 PM  Result Value Ref Range Status   MRSA by PCR NEGATIVE NEGATIVE Final    Comment:        The GeneXpert MRSA Assay (FDA approved for NASAL specimens only), is one component of a comprehensive MRSA colonization surveillance program. It is not intended to diagnose MRSA infection nor to guide or monitor treatment for MRSA  infections.      Time coordinating discharge: 33  minutes  SIGNED:   Rosita Fire, MD  [facility] 09/10/2017, 2:58 PM  If 7PM-7AM, please contact night-coverage [email or web address] Password [email or web address]

## 2017-09-10 NOTE — Progress Notes (Signed)
Discharge Note:  Removed PIV without any issues or complications. Reviewed and educated patient on discharge instructions and wound care. Patient has a follow up already scheduled with his primary care physician and a wound care appointment. Patient was able to verbalize understanding of discharge instructions and asked appropriate questions. Patient asked about what to do with having issues with feeling like he is choking at times when he eats and reports that this has been happening for a long time. Spoke with Dr. Lisbeth Renshaw about issues who suggests that if this is an ongoing problem to follow up with PCP to see if they want to do an endoscopy.  Patient shared a little about himself and the changes he plans to make. Patient does not plan on doing any smoking cessation. Patient reported he plans to continue to smoke for the time being. Gave patient smoking cessation information if he changes his mind. Also gave patient a handout on sick days for diabetes management. Encouraged patient to communicate with his primary care if he is running low on medications and if he isn't feeling well. Patient receptive and will be ready for discharge shortly.

## 2017-09-10 NOTE — Progress Notes (Addendum)
Inpatient Diabetes Program Recommendations  AACE/ADA: New Consensus Statement on Inpatient Glycemic Control (2015)  Target Ranges:  Prepandial:   less than 140 mg/dL      Peak postprandial:   less than 180 mg/dL (1-2 hours)      Critically ill patients:  140 - 180 mg/dL   Lab Results  Component Value Date   GLUCAP 337 (H) 09/10/2017   HGBA1C 9.5 (H) 07/30/2017    Review of Glycemic Control Results for [name], [name] (MRN [id number]) as of 09/10/2017 10:12  Ref. Range 09/09/2017 12:27 09/09/2017 16:06 09/09/2017 21:00 09/10/2017 05:28 09/10/2017 08:25  Glucose-Capillary Latest Ref Range: 65 - 99 mg/dL 622 (H) 297 (H) 989 (H) 328 (H) 337 (H)   Diabetes history: DM1 Outpatient Diabetes medications: Levemir 30 units qd + Novolog meal coverage  Inpatient glycemic control: Lantus 30 units qd + Novolog 3 units tid meal coverage + Novolog correction 0-15 units tid + 0-5 units hs  Inpatient Diabetes Program Recommendations:  Please consider increase in Novolog meal coverage to 4 units tid if eats 50% Will plan to speak with patient. Patient well known to inpatient diabetes coordinators due to multiple admissions. Patient was seen by DM Coordinator on 08/08/17.  2:00 pm Spoke with patient @ bedside. Patient states he ran out of pen needles @ home and didn't have glucose meter and strips-was not taking Novolog for meal coverage or correction. Reviewed sick day rules with patient and also reviewed meal coverage and correction. Patient acknowledges understanding that he needs to take his basal insulin Levemir whether he is eating or not and to cover meals and add correction as prescribed on discharge. Patient states he has a PCP appt. On Wed. Reminded patient of importance of taking log of CBGs or meter with patient to appts to review for insulin management. Patient states that his mom mailed him a meter, strips, and lancets in the mail and should be @ his home on discharge.  Thank you, [name]. Christopher Monette,  RN, MSN, CDE  Diabetes Coordinator Inpatient Glycemic Control Team Team Pager [phone] (8am-5pm) 09/10/2017 10:20 AM

## 2017-09-13 ENCOUNTER — Inpatient Hospital Stay (HOSPITAL_COMMUNITY)
Admission: EM | Admit: 2017-09-13 | Discharge: 2017-09-15 | DRG: 637 | Disposition: A | Discharge status: Home / Self Care | Payer: Medicare Other | Attending: Internal Medicine | Admitting: Internal Medicine

## 2017-09-13 ENCOUNTER — Encounter (HOSPITAL_COMMUNITY): Payer: Self-pay | Admitting: Emergency Medicine

## 2017-09-13 ENCOUNTER — Emergency Department (HOSPITAL_COMMUNITY): Payer: Medicare Other

## 2017-09-13 DIAGNOSIS — G8929 Other chronic pain: Secondary | ICD-10-CM | POA: Diagnosis not present

## 2017-09-13 DIAGNOSIS — D649 Anemia, unspecified: Secondary | ICD-10-CM | POA: Diagnosis present

## 2017-09-13 DIAGNOSIS — Z833 Family history of diabetes mellitus: Secondary | ICD-10-CM

## 2017-09-13 DIAGNOSIS — K219 Gastro-esophageal reflux disease without esophagitis: Secondary | ICD-10-CM | POA: Diagnosis not present

## 2017-09-13 DIAGNOSIS — E10621 Type 1 diabetes mellitus with foot ulcer: Secondary | ICD-10-CM | POA: Diagnosis not present

## 2017-09-13 DIAGNOSIS — R17 Unspecified jaundice: Secondary | ICD-10-CM | POA: Diagnosis present

## 2017-09-13 DIAGNOSIS — Z882 Allergy status to sulfonamides status: Secondary | ICD-10-CM | POA: Diagnosis not present

## 2017-09-13 DIAGNOSIS — D638 Anemia in other chronic diseases classified elsewhere: Secondary | ICD-10-CM | POA: Diagnosis present

## 2017-09-13 DIAGNOSIS — E1010 Type 1 diabetes mellitus with ketoacidosis without coma: Secondary | ICD-10-CM

## 2017-09-13 DIAGNOSIS — L89623 Pressure ulcer of left heel, stage 3: Secondary | ICD-10-CM | POA: Diagnosis not present

## 2017-09-13 DIAGNOSIS — E875 Hyperkalemia: Secondary | ICD-10-CM | POA: Diagnosis present

## 2017-09-13 DIAGNOSIS — Z794 Long term (current) use of insulin: Secondary | ICD-10-CM | POA: Diagnosis not present

## 2017-09-13 DIAGNOSIS — I10 Essential (primary) hypertension: Secondary | ICD-10-CM | POA: Diagnosis not present

## 2017-09-13 DIAGNOSIS — Z79899 Other long term (current) drug therapy: Secondary | ICD-10-CM

## 2017-09-13 DIAGNOSIS — F419 Anxiety disorder, unspecified: Secondary | ICD-10-CM | POA: Diagnosis present

## 2017-09-13 DIAGNOSIS — F17210 Nicotine dependence, cigarettes, uncomplicated: Secondary | ICD-10-CM | POA: Diagnosis not present

## 2017-09-13 DIAGNOSIS — F203 Undifferentiated schizophrenia: Secondary | ICD-10-CM | POA: Diagnosis not present

## 2017-09-13 DIAGNOSIS — L97409 Non-pressure chronic ulcer of unspecified heel and midfoot with unspecified severity: Secondary | ICD-10-CM | POA: Diagnosis present

## 2017-09-13 DIAGNOSIS — Z9114 Patient's other noncompliance with medication regimen: Secondary | ICD-10-CM | POA: Diagnosis not present

## 2017-09-13 DIAGNOSIS — R531 Weakness: Secondary | ICD-10-CM | POA: Diagnosis present

## 2017-09-13 DIAGNOSIS — Z888 Allergy status to other drugs, medicaments and biological substances status: Secondary | ICD-10-CM | POA: Diagnosis not present

## 2017-09-13 DIAGNOSIS — E1110 Type 2 diabetes mellitus with ketoacidosis without coma: Secondary | ICD-10-CM | POA: Diagnosis present

## 2017-09-13 DIAGNOSIS — N179 Acute kidney failure, unspecified: Secondary | ICD-10-CM | POA: Diagnosis not present

## 2017-09-13 LAB — I-STAT CHEM 8, ED
BUN: 20 mg/dL (ref 6–20)
CALCIUM ION: 1.28 mmol/L (ref 1.15–1.40)
CREATININE: 0.8 mg/dL (ref 0.61–1.24)
Chloride: 110 mmol/L (ref 101–111)
GLUCOSE: 481 mg/dL — AB (ref 65–99)
HCT: 37 % — ABNORMAL LOW (ref 39.0–52.0)
Hemoglobin: 12.6 g/dL — ABNORMAL LOW (ref 13.0–17.0)
Potassium: 4.9 mmol/L (ref 3.5–5.1)
Sodium: 135 mmol/L (ref 135–145)
TCO2: 7 mmol/L — AB (ref 22–32)

## 2017-09-13 LAB — CBC WITH DIFFERENTIAL/PLATELET
Basophils Absolute: 0 10*3/uL (ref 0.0–0.1)
Basophils Relative: 0 %
EOS PCT: 0 %
Eosinophils Absolute: 0 10*3/uL (ref 0.0–0.7)
HEMATOCRIT: 36 % — AB (ref 39.0–52.0)
HEMOGLOBIN: 11.5 g/dL — AB (ref 13.0–17.0)
LYMPHS PCT: 9 %
Lymphs Abs: 1 10*3/uL (ref 0.7–4.0)
MCH: 29.4 pg (ref 26.0–34.0)
MCHC: 31.9 g/dL (ref 30.0–36.0)
MCV: 92.1 fL (ref 78.0–100.0)
MONOS PCT: 5 %
Monocytes Absolute: 0.5 10*3/uL (ref 0.1–1.0)
NEUTROS PCT: 86 %
Neutro Abs: 9.4 10*3/uL — ABNORMAL HIGH (ref 1.7–7.7)
Platelets: 365 10*3/uL (ref 150–400)
RBC: 3.91 MIL/uL — AB (ref 4.22–5.81)
RDW: 13.7 % (ref 11.5–15.5)
WBC: 10.9 10*3/uL — ABNORMAL HIGH (ref 4.0–10.5)

## 2017-09-13 LAB — COMPREHENSIVE METABOLIC PANEL
ALBUMIN: 3.5 g/dL (ref 3.5–5.0)
ALK PHOS: 102 U/L (ref 38–126)
ALT: 25 U/L (ref 17–63)
AST: 28 U/L (ref 15–41)
BILIRUBIN TOTAL: 2.6 mg/dL — AB (ref 0.3–1.2)
BUN: 17 mg/dL (ref 6–20)
CALCIUM: 9.2 mg/dL (ref 8.9–10.3)
CO2: <7 mmol/L — ABNORMAL LOW (ref 22–32)
CREATININE: 1.78 mg/dL — AB (ref 0.61–1.24)
Chloride: 103 mmol/L (ref 101–111)
GFR calc Af Amer: 57 mL/min — ABNORMAL LOW (ref >60)
GFR calc non Af Amer: 49 mL/min — ABNORMAL LOW (ref >60)
GLUCOSE: 471 mg/dL — AB (ref 65–99)
Potassium: 5.6 mmol/L — ABNORMAL HIGH (ref 3.5–5.1)
SODIUM: 137 mmol/L (ref 135–145)
TOTAL PROTEIN: 6.3 g/dL — AB (ref 6.5–8.1)

## 2017-09-13 LAB — CBG MONITORING, ED
GLUCOSE-CAPILLARY: 414 mg/dL — AB (ref 65–99)
Glucose-Capillary: 368 mg/dL — ABNORMAL HIGH (ref 65–99)

## 2017-09-13 LAB — I-STAT TROPONIN, ED: Troponin i, poc: 0.03 ng/mL (ref 0.00–0.08)

## 2017-09-13 MED ORDER — DEXTROSE-NACL 5-0.45 % IV SOLN: INTRAVENOUS | Status: DC

## 2017-09-13 MED ORDER — POTASSIUM CHLORIDE 20 MEQ/15ML (10%) PO SOLN: 20.0000 meq | Freq: Once | ORAL | Status: DC

## 2017-09-13 MED ORDER — POTASSIUM CHLORIDE 10 MEQ/100ML IV SOLN
10.0000 meq | INTRAVENOUS | Status: DC
  Filled 2017-09-13: qty 100

## 2017-09-13 MED ORDER — SODIUM CHLORIDE 0.9 % IV SOLN
INTRAVENOUS | Status: DC
  Administered 2017-09-13: 3.1 [IU]/h via INTRAVENOUS
  Filled 2017-09-13 (×2): qty 1

## 2017-09-13 MED ORDER — INSULIN ASPART 100 UNIT/ML ~~LOC~~ SOLN
5.0000 [IU] | Freq: Once | SUBCUTANEOUS | Status: AC
  Administered 2017-09-13: 5 [IU] via SUBCUTANEOUS
  Filled 2017-09-13: qty 1

## 2017-09-13 MED ORDER — SODIUM CHLORIDE 0.9 % IV BOLUS (SEPSIS)
1000.0000 mL | Freq: Once | INTRAVENOUS | Status: AC
  Administered 2017-09-13: 1000 mL via INTRAVENOUS

## 2017-09-13 MED ORDER — ONDANSETRON HCL 4 MG/2ML IJ SOLN
4.0000 mg | Freq: Once | INTRAMUSCULAR | Status: AC
  Administered 2017-09-13: 4 mg via INTRAVENOUS
  Filled 2017-09-13: qty 2

## 2017-09-13 MED ORDER — FAMOTIDINE IN NACL 20-0.9 MG/50ML-% IV SOLN
20.0000 mg | Freq: Once | INTRAVENOUS | Status: AC
  Administered 2017-09-13: 20 mg via INTRAVENOUS
  Filled 2017-09-13: qty 50

## 2017-09-13 NOTE — ED Notes (Signed)
Pt refusing additional IV at this time, stating he will not allow [place] to start and IV until he is given a mouth swab.  Pt breathing 40 times per minute at this time, and states he is too tired to communicate with [place], will not answer questions for triage, but then will argue with RN over a mouth swab, which this RN states is not safe to give him at this time due to his respirations.  Pt refusing care "until I get a mouth swab".

## 2017-09-13 NOTE — ED Notes (Signed)
Kim EMT attempted blood draw.  Matt, RN attempted blood draw.  Patient yelled at [facility] RN and demanded he remove the needle and leave the room.  Yelling for MD, stating he is hallucinating.  PA aware already.

## 2017-09-13 NOTE — ED Notes (Addendum)
Pharmacy came to bedside.  Was asked to leave by patient.  Radiology then came to bedside for chest xray.  Patient also refusing.  PA made aware, spoke to patient at length about the risk associated with refusing and patient verbalized understanding and continued to refuse.  Xray left.  Pt refusing rectal temperature at this time

## 2017-09-13 NOTE — ED Triage Notes (Addendum)
Pt presents to ED for assessment of chest pain after being diagnosed from the hospital for DKA Monday.  Pt non-compliant with meds since d/c.  Pt called out today c/o left sided CP, given 324 ASA before EMS arrival.  CBG 340 with fire.  Respiratory rate 40.  Pt given 4mg  Zofran en route.

## 2017-09-13 NOTE — ED Provider Notes (Signed)
[facility]-EMERGENCY DEPT Provider Note   CSN: [id number] Arrival date & time: 09/13/17  2057    History   Chief Complaint Chief Complaint  Patient presents with  . Chest Pain  . Hyperglycemia    LEVEL 5 CAVEAT 2/2 ACUITY OF CONDITION.  HPI [name] is a 31 y.o. male.  31 year old male with a history of IDDM, GERD, HTN, polysubstance abuse, depression and anxiety presents to the emergency department for chest pain. He reports onset of left sided chest pain today with associated shortness of breath. CP and SOB are constant. He was given 324 mg aspirin by his roommates prior to arrival with no relief. He further reports nausea as well as 2 episodes of vomiting. EMS administered 4 mg Zofran prior to arrival for nausea and vomiting. Patient reports noncompliance with his insulin over the last 48 hours. He states that he has been "tired and sleeping a lot". He denies any known fevers. Denies polyuria. He has experienced polydipsia. He was discharged from the hospital 3 days ago after admission for DKA; hx of poor compliance with insulin regimen. Denies suicidal intent.   The history is provided by the patient. No language interpreter was used.  Chest Pain    Hyperglycemia  Associated symptoms: chest pain     Past Medical History:  Diagnosis Date  . Anemia   . Anxiety   . Arthritis    "both hips; both shoulders" (06/06/2017)  . Chicken pox   . Childhood asthma   . Chronic pain   . Depression   . DKA (diabetic ketoacidoses) ([name]) 11/18/2014  . DKA (diabetic ketoacidoses) ([name]) 07/05/2017  . GERD (gastroesophageal reflux disease)   . Hypertension   . Migraine    "a few/year" (06/06/2017)  . Noncompliance with medication regimen   . Pneumonia    "several times" (06/06/2017)  . Polysubstance abuse   . Schizo affective schizophrenia ([name])   . Scoliosis   . Type I diabetes mellitus [facility])     Patient Active Problem List   Diagnosis Date Noted  . Hypokalemia   . Non  healing left heel wound   . Cellulitis   . DKA, type 1 ([name]) 08/17/2017  . Encounter for imaging study to confirm orogastric (OG) tube placement   . Acute respiratory acidosis   . DKA (diabetic ketoacidoses) ([name]) 08/04/2017  . Acute respiratory failure with hypoxia ([name])   . Seizure ([name])   . Diabetic ketoacidosis ([name]) 07/30/2017  . Normocytic anemia 07/05/2017  . Numbness of right hand 07/05/2017  . Elevated troponin I measurement   . Positive D dimer   . DKA, type 1, not at goal [facility]) 07/01/2017  . Hyperkalemia   . AKI (acute kidney injury) ([name])   . Medically noncompliant   . Hyperglycemia 06/16/2017  . Nausea & vomiting 06/15/2017  . Abdominal pain 06/15/2017  . Chest pain 06/07/2017  . GERD (gastroesophageal reflux disease) 06/07/2017  . Substance abuse 05/30/2016  . Substance induced mood disorder ([name]) 05/30/2016  . Tobacco use disorder 12/24/2015  . DM hyperosmolarity type I, uncontrolled ([name]) 12/19/2015  . Generalized headache 07/22/2015  . Bilateral hip bursitis 05/26/2015  . Depression   . Generalized anxiety disorder 03/29/2015  . Undifferentiated schizophrenia ([name])   . Hip pain, bilateral 11/18/2014  . Dehydration 06/17/2012    Past Surgical History:  Procedure Laterality Date  . CARDIAC CATHETERIZATION  11/2016  . INCISION AND DRAINAGE ABSCESS Left 11/2011   "MRSA removed off my thumb"  Home Medications    Prior to Admission medications   Medication Sig Start Date End Date Taking? Authorizing Provider  acetaminophen (TYLENOL) 500 MG tablet Take 1 tablet (500 mg total) by mouth every 6 (six) hours as needed. 09/10/17  Yes Maxie Barb, MD  insulin aspart (NOVOLOG FLEXPEN) 100 UNIT/ML FlexPen Inject 0-15 Units into the skin See admin instructions. Check Blood Sugar 4 times per day > with meals and at bedtime CBG 70 - 120: 0 units CBG 121 - 150: 2 units CBG 151 - 200: 3 units CBG 201 - 250: 5 units CBG 251 - 300: 8 units CBG 301 -  350: 11 units CBG 351 - 400: 15 units 09/10/17  Yes Maxie Barb, MD  amphetamine-dextroamphetamine (ADDERALL) 30 MG tablet Take 30 mg by mouth daily.  06/26/17   [provider]  collagenase (SANTYL) ointment Apply Santyl to left heel wound Q day, then cover with moist gauze and 4X4 and kerlex 09/10/17   Maxie Barb, MD  doxepin [facility]) 10 MG/ML solution Take 10 mLs by mouth at bedtime. 07/11/17   [provider]  insulin detemir (LEVEMIR) 100 UNIT/ML injection Inject 0.3 mLs (30 Units total) into the skin daily. 09/10/17   Maxie Barb, MD  metoCLOPramide (REGLAN) 10 MG tablet Take 10 mg by mouth 3 (three) times daily as needed. stomach 08/11/17   [provider]  pantoprazole (PROTONIX) 40 MG tablet Take 1 tablet (40 mg total) by mouth daily. 06/10/17   Kathlen Mody, MD  potassium chloride (K-DUR) 10 MEQ tablet Take 1 tablet (10 mEq total) by mouth daily. 09/10/17   Maxie Barb, MD  sennosides-docusate sodium (SENOKOT-S) 8.6-50 MG tablet Take 1 tablet by mouth as needed for constipation.    [provider]  traZODone (DESYREL) 150 MG tablet Take 150 mg by mouth at bedtime. 06/26/17   [provider]    Family History Family History  Problem Relation Age of Onset  . Diabetes Mother     Social History Social History  Substance Use Topics  . Smoking status: Current Every Day Smoker    Packs/day: 0.50    Years: 9.00    Types: Cigarettes  . Smokeless tobacco: Never Used  . Alcohol use No     Allergies   Sulfa antibiotics and Fentanyl   Review of Systems Review of Systems  Unable to perform ROS: Acuity of condition  Cardiovascular: Positive for chest pain.     Physical Exam Updated Vital Signs BP 124/85   Pulse (!) 142   Temp 97.7 F (36.5 C) (Oral)   Resp (!) 22   Ht 6\' 1"  (1.854 m)   Wt 78.5 kg (173 lb)   SpO2 100%   BMI 22.82 kg/m   Physical Exam  Constitutional: He is oriented to  person, place, and time. He appears well-developed and well-nourished. No distress.  Chronically thin, frail appearing  HENT:  Head: Normocephalic and atraumatic.  Dry mm  Eyes: Conjunctivae and EOM are normal. No scleral icterus.  Neck: Normal range of motion.  Cardiovascular: Regular rhythm and intact distal pulses.   Tachycardia; mid 130's  Pulmonary/Chest: Accessory muscle usage (mild) present. Tachypnea noted. He has no wheezes. He has no rales.  Tachypnea with clear breath sounds. +Kussmaul respirations.  Abdominal: Soft. He exhibits no distension. There is no tenderness. There is no guarding.  Soft, nondistended, nontender abdomen.  Musculoskeletal: Normal range of motion.  Neurological: He is alert and oriented to person,  place, and time. He exhibits normal muscle tone. Coordination normal.  GCS 15. Speech is goal oriented. Answers questions appropriately and follows commands.  Skin: Skin is warm and dry. No rash noted. He is not diaphoretic. No erythema. No pallor.  Psychiatric: He has a normal mood and affect. His behavior is normal.  Denies SI or suicidal intent  Nursing note and vitals reviewed.    ED Treatments / Results  Labs (all labs ordered are listed, but only abnormal results are displayed) Labs Reviewed  COMPREHENSIVE METABOLIC PANEL - Abnormal; Notable for the following:       Result Value   Potassium 5.6 (*)    CO2 <7 (*)    Glucose, Bld 471 (*)    Creatinine, Ser 1.78 (*)    Total Protein 6.3 (*)    Total Bilirubin 2.6 (*)    GFR calc non Af Amer 49 (*)    GFR calc Af Amer 57 (*)    All other components within normal limits  CBC WITH DIFFERENTIAL/PLATELET - Abnormal; Notable for the following:    WBC 10.9 (*)    RBC 3.91 (*)    Hemoglobin 11.5 (*)    HCT 36.0 (*)    All other components within normal limits  I-STAT CHEM 8, ED - Abnormal; Notable for the following:    Glucose, Bld 481 (*)    TCO2 7 (*)    Hemoglobin 12.6 (*)    HCT 37.0 (*)     All other components within normal limits  CBG MONITORING, ED - Abnormal; Notable for the following:    Glucose-Capillary 414 (*)    All other components within normal limits  CBC WITH DIFFERENTIAL/PLATELET  URINALYSIS, ROUTINE W REFLEX MICROSCOPIC  I-STAT TROPONIN, ED  CBG MONITORING, ED    EKG  EKG Interpretation  Date/Time:  Thursday September 13 2017 21:04:30 EDT Ventricular Rate:  133 PR Interval:    QRS Duration: 93 QT Interval:  311 QTC Calculation: 463 R Axis:   -32 Text Interpretation:  Sinus tachycardia Left axis deviation Peaked T waves no longer present Confirmed by Pricilla Loveless [phone]) on 09/13/2017 10:48:38 PM       Radiology No results found.  Procedures Procedures (including critical care time)  Medications Ordered in ED Medications  dextrose 5 %-0.45 % sodium chloride infusion (not administered)  insulin regular (NOVOLIN R,HUMULIN R) 100 Units in sodium chloride 0.9 % 100 mL (1 Units/mL) infusion (not administered)  ondansetron (ZOFRAN) injection 4 mg (not administered)  sodium chloride 0.9 % bolus 1,000 mL (not administered)  potassium chloride 20 MEQ/15ML (10%) solution 20 mEq (not administered)  sodium chloride 0.9 % bolus 1,000 mL (0 mLs Intravenous Stopped 09/13/17 2233)  insulin aspart (novoLOG) injection 5 Units (5 Units Subcutaneous Given 09/13/17 2150)  famotidine (PEPCID) IVPB 20 mg premix (20 mg Intravenous New Bag/Given 09/13/17 2242)    CRITICAL CARE Performed by: Antony Madura   Total critical care time: 45 minutes  Critical care time was exclusive of separately billable procedures and treating other patients.  Critical care was necessary to treat or prevent imminent or life-threatening deterioration.  Critical care was time spent personally by me on the following activities: development of treatment plan with patient and/or surrogate as well as nursing, discussions with consultants, evaluation of patient's response to treatment,  examination of patient, obtaining history from patient or surrogate, ordering and performing treatments and interventions, ordering and review of laboratory studies, ordering and review of radiographic studies, pulse oximetry and  re-evaluation of patient's condition.   Initial Impression / Assessment and Plan / ED Course  I have reviewed the triage vital signs and the nursing notes.  Pertinent labs & imaging results that were available during my care of the patient were reviewed by me and considered in my medical decision making (see chart for details).    9:30 PM Patient presenting with clinical picture c/w DKA. Hx of noncompliance with insulin regimen x 48 hours. Discharged on 09/10/17 after admission for same. Patient hospitalized multiple times in the past for DKA due to noncompliance. No fevers. +N/V prior to arrival, controlled with Zofran given by EMS. Will obtain labs IVF ordered.  10:50 PM Calculated anion gap 18 c/w acidosis. Remainder of labs pending. Insulin gtt ordered for management of DKA.  10:55 PM PH is 7.056 on I-Stat VBG. Not crossing over on Epic. Seems patient usually admitted to PCCM. PH is borderline for critical care vs hospitalist admission. Will discuss admission with PCCM given hx of multiple admissions to their service.  11:35 PM Case discussed with Dr. Vassie Loll of PCCM. He is reassured by venous pH; believes based on the patient's work up thus far that he is stable for further management of DKA in the [facility] ICU. Will consult [facility] for admission.  12:19 AM Pending admission discussion with [facility]. Flow manager contacted to discuss need for repeat page.  12:28 AM Admission orders placed by hospitalist.   Final Clinical Impressions(s) / ED Diagnoses   Final diagnoses:  Diabetic ketoacidosis without coma associated with type 1 diabetes mellitus ([name])  AKI (acute kidney injury) ([name])  Noncompliance with medication regimen    New Prescriptions New  Prescriptions   No medications on file     Antony Madura, Cordelia Poche 09/14/17 0029    Pricilla Loveless, MD 09/15/17 [phone]

## 2017-09-13 NOTE — ED Notes (Signed)
Pt demanding water in his ice cup. This RN made patient aware of the reasons why we do not want him to drink water.  This RN reviewed risks and patient states "I don't care.  Just give it to me".  This RN provided patient with half a cup of water.

## 2017-09-13 NOTE — ED Notes (Signed)
Venous blood gas ran 3 different times with 2 different samples. <> in results Dr Criss Alvine given a copy of results and is aware results will not cross over. He says that's fine.

## 2017-09-13 NOTE — ED Notes (Signed)
PA aware of pt's refusal for care at this time due to asking for a mouth swab.  This RN explained rational for not allow the pt anything by mouth.  Pt states he is too tired to speak, this concerns RN for airway protection.  PA states she will be in shortly to speak to pt.

## 2017-09-13 NOTE — ED Provider Notes (Signed)
Medical screening examination/treatment/procedure(s) were conducted as a shared visit with non-physician practitioner(s) and myself.  I personally evaluated the patient during the encounter.   EKG Interpretation  Date/Time:  Thursday September 13 2017 21:04:30 EDT Ventricular Rate:  133 PR Interval:    QRS Duration: 93 QT Interval:  311 QTC Calculation: 463 R Axis:   -32 Text Interpretation:  Sinus tachycardia Left axis deviation Peaked T waves no longer present Confirmed by Pricilla Loveless [phone]) on 09/13/2017 10:48:38 PM       Patient well known to this ED/hospital with noncompliance and frequent DKA presents in DKA again. Given IV fluids, started on insulin. PH just above 7. He is awake, alert, airway is stable. Admit.   Angiocath insertion Performed by: Pricilla Loveless T  Consent: Verbal consent obtained. Risks and benefits: risks, benefits and alternatives were discussed Time out: Immediately prior to procedure a "time out" was called to verify the correct patient, procedure, equipment, support staff and site/side marked as required.  Preparation: Patient was prepped and draped in the usual sterile fashion.  Vein Location: left basilic  Ultrasound Guided  Gauge: 20  Normal blood return and flush without difficulty Patient tolerance: Patient tolerated the procedure well with no immediate complications.      Pricilla Loveless, MD 09/13/17 2337

## 2017-09-14 ENCOUNTER — Inpatient Hospital Stay (HOSPITAL_COMMUNITY): Payer: Medicare Other

## 2017-09-14 ENCOUNTER — Encounter (HOSPITAL_COMMUNITY): Payer: Self-pay | Admitting: Family Medicine

## 2017-09-14 DIAGNOSIS — E875 Hyperkalemia: Secondary | ICD-10-CM

## 2017-09-14 DIAGNOSIS — L97422 Non-pressure chronic ulcer of left heel and midfoot with fat layer exposed: Secondary | ICD-10-CM | POA: Diagnosis not present

## 2017-09-14 DIAGNOSIS — Z882 Allergy status to sulfonamides status: Secondary | ICD-10-CM | POA: Diagnosis not present

## 2017-09-14 DIAGNOSIS — G8929 Other chronic pain: Secondary | ICD-10-CM | POA: Diagnosis not present

## 2017-09-14 DIAGNOSIS — F419 Anxiety disorder, unspecified: Secondary | ICD-10-CM | POA: Diagnosis not present

## 2017-09-14 DIAGNOSIS — F203 Undifferentiated schizophrenia: Secondary | ICD-10-CM | POA: Diagnosis not present

## 2017-09-14 DIAGNOSIS — R17 Unspecified jaundice: Secondary | ICD-10-CM | POA: Diagnosis not present

## 2017-09-14 DIAGNOSIS — N179 Acute kidney failure, unspecified: Secondary | ICD-10-CM | POA: Diagnosis not present

## 2017-09-14 DIAGNOSIS — K219 Gastro-esophageal reflux disease without esophagitis: Secondary | ICD-10-CM | POA: Diagnosis not present

## 2017-09-14 DIAGNOSIS — I10 Essential (primary) hypertension: Secondary | ICD-10-CM | POA: Diagnosis not present

## 2017-09-14 DIAGNOSIS — E1010 Type 1 diabetes mellitus with ketoacidosis without coma: Secondary | ICD-10-CM | POA: Diagnosis not present

## 2017-09-14 DIAGNOSIS — Z9114 Patient's other noncompliance with medication regimen: Secondary | ICD-10-CM | POA: Diagnosis not present

## 2017-09-14 DIAGNOSIS — D649 Anemia, unspecified: Secondary | ICD-10-CM | POA: Diagnosis not present

## 2017-09-14 DIAGNOSIS — R531 Weakness: Secondary | ICD-10-CM | POA: Diagnosis present

## 2017-09-14 DIAGNOSIS — E0810 Diabetes mellitus due to underlying condition with ketoacidosis without coma: Secondary | ICD-10-CM

## 2017-09-14 DIAGNOSIS — Z794 Long term (current) use of insulin: Secondary | ICD-10-CM | POA: Diagnosis not present

## 2017-09-14 DIAGNOSIS — Z888 Allergy status to other drugs, medicaments and biological substances status: Secondary | ICD-10-CM | POA: Diagnosis not present

## 2017-09-14 DIAGNOSIS — E10621 Type 1 diabetes mellitus with foot ulcer: Secondary | ICD-10-CM | POA: Diagnosis not present

## 2017-09-14 DIAGNOSIS — D638 Anemia in other chronic diseases classified elsewhere: Secondary | ICD-10-CM | POA: Diagnosis not present

## 2017-09-14 DIAGNOSIS — L97409 Non-pressure chronic ulcer of unspecified heel and midfoot with unspecified severity: Secondary | ICD-10-CM | POA: Diagnosis present

## 2017-09-14 DIAGNOSIS — F17210 Nicotine dependence, cigarettes, uncomplicated: Secondary | ICD-10-CM | POA: Diagnosis not present

## 2017-09-14 DIAGNOSIS — Z833 Family history of diabetes mellitus: Secondary | ICD-10-CM | POA: Diagnosis not present

## 2017-09-14 DIAGNOSIS — L89623 Pressure ulcer of left heel, stage 3: Secondary | ICD-10-CM | POA: Diagnosis not present

## 2017-09-14 LAB — BASIC METABOLIC PANEL
ANION GAP: 10 (ref 5–15)
ANION GAP: 6 (ref 5–15)
ANION GAP: 11 (ref 5–15)
ANION GAP: 9 (ref 5–15)
BUN: 11 mg/dL (ref 6–20)
BUN: 9 mg/dL (ref 6–20)
BUN: 9 mg/dL (ref 6–20)
BUN: 5 mg/dL — AB (ref 6–20)
CALCIUM: 8.6 mg/dL — AB (ref 8.9–10.3)
CALCIUM: 8.5 mg/dL — AB (ref 8.9–10.3)
CHLORIDE: 114 mmol/L — AB (ref 101–111)
CO2: 12 mmol/L — AB (ref 22–32)
CO2: 16 mmol/L — AB (ref 22–32)
CO2: 11 mmol/L — AB (ref 22–32)
CO2: 16 mmol/L — ABNORMAL LOW (ref 22–32)
CREATININE: 0.82 mg/dL (ref 0.61–1.24)
Calcium: 8.6 mg/dL — ABNORMAL LOW (ref 8.9–10.3)
Calcium: 8.5 mg/dL — ABNORMAL LOW (ref 8.9–10.3)
Chloride: 115 mmol/L — ABNORMAL HIGH (ref 101–111)
Chloride: 115 mmol/L — ABNORMAL HIGH (ref 101–111)
Chloride: 111 mmol/L (ref 101–111)
Creatinine, Ser: 1.18 mg/dL (ref 0.61–1.24)
Creatinine, Ser: 0.85 mg/dL (ref 0.61–1.24)
Creatinine, Ser: 1 mg/dL (ref 0.61–1.24)
GFR calc Af Amer: >60 mL/min (ref >60)
GFR calc Af Amer: >60 mL/min (ref >60)
GFR calc Af Amer: >60 mL/min (ref >60)
GFR calc Af Amer: >60 mL/min (ref >60)
GFR calc non Af Amer: >60 mL/min (ref >60)
GFR calc non Af Amer: >60 mL/min (ref >60)
GFR calc non Af Amer: >60 mL/min (ref >60)
GFR calc non Af Amer: >60 mL/min (ref >60)
GLUCOSE: 177 mg/dL — AB (ref 65–99)
GLUCOSE: 109 mg/dL — AB (ref 65–99)
GLUCOSE: 162 mg/dL — AB (ref 65–99)
Glucose, Bld: 147 mg/dL — ABNORMAL HIGH (ref 65–99)
POTASSIUM: 3.6 mmol/L (ref 3.5–5.1)
POTASSIUM: 3.4 mmol/L — AB (ref 3.5–5.1)
POTASSIUM: 4 mmol/L (ref 3.5–5.1)
Potassium: 3.3 mmol/L — ABNORMAL LOW (ref 3.5–5.1)
Sodium: 137 mmol/L (ref 135–145)
Sodium: 137 mmol/L (ref 135–145)
Sodium: 136 mmol/L (ref 135–145)
Sodium: 136 mmol/L (ref 135–145)

## 2017-09-14 LAB — HEPATIC FUNCTION PANEL
ALT: 21 U/L (ref 17–63)
AST: 16 U/L (ref 15–41)
Albumin: 3 g/dL — ABNORMAL LOW (ref 3.5–5.0)
Alkaline Phosphatase: 78 U/L (ref 38–126)
BILIRUBIN DIRECT: 0.1 mg/dL (ref 0.1–0.5)
BILIRUBIN INDIRECT: 0.7 mg/dL (ref 0.3–0.9)
BILIRUBIN TOTAL: 0.8 mg/dL (ref 0.3–1.2)
Total Protein: 5 g/dL — ABNORMAL LOW (ref 6.5–8.1)

## 2017-09-14 LAB — I-STAT CHEM 8, ED
BUN: 10 mg/dL (ref 6–20)
CALCIUM ION: 1.33 mmol/L (ref 1.15–1.40)
CHLORIDE: 114 mmol/L — AB (ref 101–111)
Creatinine, Ser: 0.6 mg/dL — ABNORMAL LOW (ref 0.61–1.24)
Glucose, Bld: 113 mg/dL — ABNORMAL HIGH (ref 65–99)
HCT: 28 % — ABNORMAL LOW (ref 39.0–52.0)
Hemoglobin: 9.5 g/dL — ABNORMAL LOW (ref 13.0–17.0)
POTASSIUM: 3.6 mmol/L (ref 3.5–5.1)
SODIUM: 141 mmol/L (ref 135–145)
TCO2: 16 mmol/L — ABNORMAL LOW (ref 22–32)

## 2017-09-14 LAB — CBG MONITORING, ED
GLUCOSE-CAPILLARY: 151 mg/dL — AB (ref 65–99)
GLUCOSE-CAPILLARY: 125 mg/dL — AB (ref 65–99)
GLUCOSE-CAPILLARY: 102 mg/dL — AB (ref 65–99)
GLUCOSE-CAPILLARY: 116 mg/dL — AB (ref 65–99)
GLUCOSE-CAPILLARY: 147 mg/dL — AB (ref 65–99)
GLUCOSE-CAPILLARY: 132 mg/dL — AB (ref 65–99)
Glucose-Capillary: 256 mg/dL — ABNORMAL HIGH (ref 65–99)
Glucose-Capillary: 194 mg/dL — ABNORMAL HIGH (ref 65–99)
Glucose-Capillary: 167 mg/dL — ABNORMAL HIGH (ref 65–99)
Glucose-Capillary: 121 mg/dL — ABNORMAL HIGH (ref 65–99)
Glucose-Capillary: 173 mg/dL — ABNORMAL HIGH (ref 65–99)

## 2017-09-14 LAB — URINALYSIS, ROUTINE W REFLEX MICROSCOPIC
Bilirubin Urine: NEGATIVE
KETONES UR: 80 mg/dL — AB
LEUKOCYTES UA: NEGATIVE
NITRITE: NEGATIVE
PH: 5 (ref 5.0–8.0)
Protein, ur: 30 mg/dL — AB
Specific Gravity, Urine: 1.011 (ref 1.005–1.030)
Squamous Epithelial / LPF: NONE SEEN

## 2017-09-14 LAB — HEMOGLOBIN A1C
HEMOGLOBIN A1C: 7.1 % — AB (ref 4.8–5.6)
Mean Plasma Glucose: 157.07 mg/dL

## 2017-09-14 LAB — TROPONIN I

## 2017-09-14 MED ORDER — TRAZODONE HCL 50 MG PO TABS
150.0000 mg | ORAL_TABLET | Freq: Every day | ORAL | Status: DC
  Administered 2017-09-14 (×2): 150 mg via ORAL
  Filled 2017-09-14: qty 3
  Filled 2017-09-14: qty 1

## 2017-09-14 MED ORDER — INSULIN ASPART 100 UNIT/ML ~~LOC~~ SOLN: 0.0000 [IU] | Freq: Every day | SUBCUTANEOUS | Status: DC

## 2017-09-14 MED ORDER — ONDANSETRON HCL 4 MG/2ML IJ SOLN: 4.0000 mg | Freq: Four times a day (QID) | INTRAMUSCULAR | Status: DC | PRN

## 2017-09-14 MED ORDER — GI COCKTAIL ~~LOC~~
30.0000 mL | Freq: Three times a day (TID) | ORAL | Status: DC | PRN
  Administered 2017-09-14 – 2017-09-15 (×2): 30 mL via ORAL
  Filled 2017-09-14 (×2): qty 30

## 2017-09-14 MED ORDER — DEXTROSE-NACL 5-0.45 % IV SOLN
INTRAVENOUS | Status: DC
  Administered 2017-09-14 (×2): via INTRAVENOUS

## 2017-09-14 MED ORDER — DOXEPIN HCL 100 MG PO CAPS
100.0000 mg | ORAL_CAPSULE | Freq: Every day | ORAL | Status: DC
  Administered 2017-09-14 (×2): 100 mg via ORAL
  Filled 2017-09-14 (×4): qty 1

## 2017-09-14 MED ORDER — SODIUM CHLORIDE 0.9 % IV SOLN: INTRAVENOUS | Status: AC

## 2017-09-14 MED ORDER — SODIUM CHLORIDE 0.9 % IV BOLUS (SEPSIS)
1000.0000 mL | Freq: Once | INTRAVENOUS | Status: AC
  Administered 2017-09-14: 1000 mL via INTRAVENOUS

## 2017-09-14 MED ORDER — BACITRACIN-NEOMYCIN-POLYMYXIN OINTMENT TUBE
TOPICAL_OINTMENT | Freq: Every day | CUTANEOUS | Status: DC
  Administered 2017-09-14 – 2017-09-15 (×2): via TOPICAL
  Filled 2017-09-14 (×2): qty 14.17

## 2017-09-14 MED ORDER — OXYCODONE HCL 5 MG PO TABS
5.0000 mg | ORAL_TABLET | ORAL | Status: DC | PRN
  Administered 2017-09-14: 10 mg via ORAL
  Administered 2017-09-15: 5 mg via ORAL
  Filled 2017-09-14: qty 1
  Filled 2017-09-14: qty 2

## 2017-09-14 MED ORDER — PANTOPRAZOLE SODIUM 40 MG PO TBEC
40.0000 mg | DELAYED_RELEASE_TABLET | Freq: Every day | ORAL | Status: DC
  Administered 2017-09-15: 40 mg via ORAL
  Filled 2017-09-14 (×2): qty 1

## 2017-09-14 MED ORDER — SODIUM CHLORIDE 0.9 % IV SOLN: INTRAVENOUS | Status: DC

## 2017-09-14 MED ORDER — ACETAMINOPHEN 325 MG PO TABS: 650.0000 mg | ORAL_TABLET | Freq: Four times a day (QID) | ORAL | Status: DC | PRN

## 2017-09-14 MED ORDER — INSULIN ASPART 100 UNIT/ML ~~LOC~~ SOLN
0.0000 [IU] | Freq: Three times a day (TID) | SUBCUTANEOUS | Status: DC
  Administered 2017-09-14: 2 [IU] via SUBCUTANEOUS
  Administered 2017-09-14: 1 [IU] via SUBCUTANEOUS
  Administered 2017-09-15: 2 [IU] via SUBCUTANEOUS
  Administered 2017-09-15: 7 [IU] via SUBCUTANEOUS
  Filled 2017-09-14 (×2): qty 1

## 2017-09-14 MED ORDER — SODIUM CHLORIDE 0.9 % IV SOLN
INTRAVENOUS | Status: DC
  Administered 2017-09-14: 12:00:00 via INTRAVENOUS

## 2017-09-14 MED ORDER — SENNOSIDES-DOCUSATE SODIUM 8.6-50 MG PO TABS: 1.0000 | ORAL_TABLET | Freq: Every evening | ORAL | Status: DC | PRN

## 2017-09-14 MED ORDER — INSULIN GLARGINE 100 UNIT/ML ~~LOC~~ SOLN
20.0000 [IU] | Freq: Every day | SUBCUTANEOUS | Status: DC
  Administered 2017-09-14: 20 [IU] via SUBCUTANEOUS
  Filled 2017-09-14 (×2): qty 0.2

## 2017-09-14 MED ORDER — HEPARIN SODIUM (PORCINE) 5000 UNIT/ML IJ SOLN
5000.0000 [IU] | Freq: Three times a day (TID) | INTRAMUSCULAR | Status: DC
  Administered 2017-09-14: 5000 [IU] via SUBCUTANEOUS
  Filled 2017-09-14 (×3): qty 1

## 2017-09-14 MED ORDER — METOCLOPRAMIDE HCL 5 MG/ML IJ SOLN
5.0000 mg | Freq: Three times a day (TID) | INTRAMUSCULAR | Status: DC
  Administered 2017-09-14 (×3): 5 mg via INTRAVENOUS
  Filled 2017-09-14 (×4): qty 2

## 2017-09-14 NOTE — Progress Notes (Signed)
Inpatient Diabetes Program Recommendations  AACE/ADA: New Consensus Statement on Inpatient Glycemic Control (2015)  Target Ranges:  Prepandial:   less than 140 mg/dL      Peak postprandial:   less than 180 mg/dL (1-2 hours)      Critically ill patients:  140 - 180 mg/dL   Lab Results  Component Value Date   GLUCAP 147 (H) 09/14/2017   HGBA1C 9.5 (H) 07/30/2017    Review of Glycemic ControlResults for ANGELOS, WASCO (MRN [id number]) as of 09/14/2017 12:26  Ref. Range 09/14/2017 09:43 09/14/2017 11:13  Glucose-Capillary Latest Ref Range: 65 - 99 mg/dL 878 (H) 676 (H)    Diabetes history: Type 1 DM Outpatient Diabetes medications: Levemir 30 units daily, Novolog flexpen Current orders for Inpatient glycemic control:  Novolog sensitive tid with meals and HS, Lantus 20 units daily  Inpatient Diabetes Program Recommendations:    Note that patient re-admitted after being d/c'd on 09/10/17.  Patient was seen by Diabetes Coordinator at previous admission and he told her that he had all supplies except pen needles.  It is unclear why he did not take medications after d/c home.  He has had 9 admits in the past 6 months.  May benefit from Psych. Consultation?  Patient transitioning off insulin drip.  Agree with orders.  May also need addition of Novolog 3 units tid with meals once eating.    Thanks, Beryl Meager, RN, BC-ADM Inpatient Diabetes Coordinator Pager [phone] (8a-5p)

## 2017-09-14 NOTE — ED Notes (Addendum)
Dinner tray ordered.

## 2017-09-14 NOTE — ED Notes (Signed)
Patient refusing to answer level of consciousness questions (what month is it? Etc) Patient states " I am not going to answer those questions because you keep asking me the same thing over and over. Rationale given. Patient continues to refuse to answer loc questions. Patient alert and eating lunch.

## 2017-09-14 NOTE — ED Notes (Addendum)
Patient refusing to take po protonix. Patient states "im sleeping, I don't want any medicine right now"

## 2017-09-14 NOTE — ED Notes (Signed)
Patient no longer has bed assigned at this time after speaking with bed placement.

## 2017-09-14 NOTE — ED Notes (Addendum)
Attempted to call report x 1. Reported RN still in report. Left number for floor RN to return call for report.

## 2017-09-14 NOTE — ED Notes (Signed)
Pt given Sprite Zero per Hayley(RN)

## 2017-09-14 NOTE — ED Notes (Signed)
Pt ate 100% of dinner

## 2017-09-14 NOTE — ED Notes (Signed)
Attempted to call report x 2 since floor RN did not return call for report. Placed on hold. No answer. Unable to give report.

## 2017-09-14 NOTE — ED Notes (Signed)
Attempted to call report x 1. No answer on unit. Called multiple times.

## 2017-09-14 NOTE — ED Notes (Signed)
Admitting Dr. Tana Coast aware of blood sugar 102 Dr. Tana Coast verbally ordered basic met/istat chem 8 before discontinuing insulin drip

## 2017-09-14 NOTE — Progress Notes (Signed)
[facility]                                                                              Patient Demographics  [name], is a 31 y.o. male, DOB - 1986/08/08, OVZ:[id number]  Admit date - 09/13/2017   Admitting Physician Briscoe Deutscher, MD  Outpatient Primary MD for the patient is Fleet Contras, MD  Outpatient specialists:   LOS - 0  days   Medical records reviewed and are as summarized below:    Chief Complaint  Patient presents with  . Chest Pain  . Hyperglycemia       Brief summary    Patient is a 31 year old male with insulin-dependent diabetes mellitus, schizophrenia, chronic anemia, noncompliant, GERD presented to ED with multiple complaints including chest pain, generalized weakness, lethargy. Patient ad been just discharged from the hospital on 9/24, admitted for DKA, reported that he can't take care of himself and had not been taking his medications. In ED,patient was found to have serum creatinine of 1.7 (0.59 on 9/24), glucose 471, undetectable low bicarbonate, potassium 5.6, AG close to 30   Assessment & Plan    Principal Problem:   DKA (diabetic ketoacidoses) ([name]) - likely due to severe noncompliance, not taking his medication after the recent discharge. Refusing treatment in ED - Patient was placed on aggressive IV fluid hydration, insulin infusion - Gap is now closed, bicarbonate is improving, transition to Lantus, sliding scale insulin - once alert and awake and eating > 50%, will add meal coverage, follow hemoglobin A1c. 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, unable to take care of himself - will place psychiatry consult - Continue doxepin, trazodone    Hyperkalemia - Serum potassium 5.6 on admission secondary to DKA, AK I - Improving, 3.6 today    AKI (acute kidney injury) ([name]) - Presented with a creatinine 1.7 on admission, 0.59 on 9/24 -  Creatinine improved, 0.6 today     Normocytic anemia - total bilirubin 2.6 on admission, improved from recent prior, no significant right upper quadrant tenderness, remaining LFTs within normal limits    Left Heel ulcer ([name]) - wound care consult  Anemia of chronic disease - Baseline 8-9 - Currently stable  Code Status: full  DVT Prophylaxis:  Heparin subcutaneous Family Communication: Discussed in detail with the patient, all imaging results, lab results explained to the patient  Disposition Plan:   Time Spent in minutes   25 minutes  Procedures:  none   Consultants:   None   Antimicrobials:      Medications  Scheduled Meds: . doxepin  100 mg Oral QHS  . heparin  5,000 Units Subcutaneous Q8H  . insulin aspart  0-5 Units Subcutaneous QHS  . insulin aspart  0-9 Units Subcutaneous TID WC  . insulin glargine  20 Units Subcutaneous Daily  . metoCLOPramide (REGLAN) injection  5 mg Intravenous Q8H  . pantoprazole  40 mg Oral Daily  . traZODone  150 mg Oral QHS   Continuous Infusions: . sodium chloride    . sodium chloride 100 mL/hr at 09/14/17 1219  .  dextrose 5 % and 0.45% NaCl Stopped (09/14/17 1217)  . insulin (NOVOLIN-R) infusion Stopped (09/14/17 1217)   PRN Meds:.acetaminophen, gi cocktail, ondansetron (ZOFRAN) IV, oxyCODONE, senna-docusate   Antibiotics   Anti-infectives    None        Subjective:   [name] was seen and examined today. Patient denies dizziness, chest pain, shortness of breath, abdominal pain, N/V/D/C, new weakness, numbess, tingling. No acute events overnight.    Objective:   Vitals:   09/14/17 1100 09/14/17 1130 09/14/17 1200 09/14/17 1230  BP: 113/65 110/68 120/73 115/70  Pulse: (!) 114 (!) 114 (!) 115 (!) 113  Resp: 17 13 12 15   Temp:      TempSrc:      SpO2: 98% 100% 100% 98%  Weight:      Height:        Intake/Output Summary (Last 24 hours) at 09/14/17 1316 Last data filed at 09/14/17 1217  Gross per 24  hour  Intake             1150 ml  Output             1050 ml  Net              100 ml     Wt Readings from Last 3 Encounters:  09/13/17 78.5 kg (173 lb)  09/07/17 78.1 kg (172 lb 2.9 oz)  08/17/17 89 kg (196 lb 3.4 oz)     Exam  General: Alert and oriented x 3, NAD  Eyes:   HEENT: normocephalic, atraumatic  Cardiovascular: S1 S2 auscultated, no rubs, murmurs or gallops. Regular rate and rhythm.tachycardia  Respiratory: Clear to auscultation bilaterally, no wheezing, rales or rhonchi  Gastrointestinal: Soft, nontender, nondistended, + bowel sounds  Ext: no pedal edema bilaterally  Neuro: no new deficits  Musculoskeletal: No digital cyanosis, clubbing  Skin: left heel ulcer, plantar aspect, no cellulitis  Psych: flat affect   Data Reviewed:  I have personally reviewed following labs and imaging studies  Micro Results Recent Results (from the past 240 hour(s))  MRSA PCR Screening     Status: None   Collection Time: 09/07/17  9:20 PM  Result Value Ref Range Status   MRSA by PCR NEGATIVE NEGATIVE Final    Comment:        The GeneXpert MRSA Assay (FDA approved for NASAL specimens only), is one component of a comprehensive MRSA colonization surveillance program. It is not intended to diagnose MRSA infection nor to guide or monitor treatment for MRSA infections.     Radiology Reports Ct Angio Head W Or Wo Contrast  Result Date: 08/17/2017 CLINICAL DATA:  Code stroke.  Complete visual loss.  Hyperglycemia. EXAM: CT ANGIOGRAPHY HEAD AND NECK TECHNIQUE: Multidetector CT imaging of the head and neck was performed using the standard protocol during bolus administration of intravenous contrast. Multiplanar CT image reconstructions and MIPs were obtained to evaluate the vascular anatomy. Carotid stenosis measurements (when applicable) are obtained utilizing NASCET criteria, using the distal internal carotid diameter as the denominator. CONTRAST:  Dose currently not  available.  Reference EMR. COMPARISON:  Head CT earlier today FINDINGS: CTA NECK FINDINGS Aortic arch: Normal.  Three vessel branching. Right carotid system: Smooth and widely patent. No atheromatous changes. Left carotid system: Smooth and widely patent. No atheromatous changes. Vertebral arteries: No proximal subclavian stenosis. Codominant vertebral arteries that are smooth and widely patent. Skeleton: No acute or aggressive finding. There is poor dentition with associated sclerosis of the alveolar ridge  in both the mandible and maxilla. Other neck: No noted mass or inflammation. Upper chest: Negative Review of the MIP images confirms the above findings CTA HEAD FINDINGS Anterior circulation: Vessels are smooth and widely patent. Negative for aneurysm or beading. Posterior circulation: Codominant vertebral arteries. The vertebral and basilar arteries are widely patent. No branch occlusion or stenosis noted. Negative for beading or aneurysm. Venous sinuses: Patent Anatomic variants: Incomplete circle-of-Willis at the right posterior communicating artery. Delayed phase: Not performed in the emergent setting. Review of the MIP images confirms the above findings IMPRESSION: Negative CTA of the head and neck. Electronically Signed   By: Marnee Spring M.D.   On: 08/17/2017 09:12   Ct Angio Neck W Or Wo Contrast  Result Date: 08/17/2017 CLINICAL DATA:  Code stroke.  Complete visual loss.  Hyperglycemia. EXAM: CT ANGIOGRAPHY HEAD AND NECK TECHNIQUE: Multidetector CT imaging of the head and neck was performed using the standard protocol during bolus administration of intravenous contrast. Multiplanar CT image reconstructions and MIPs were obtained to evaluate the vascular anatomy. Carotid stenosis measurements (when applicable) are obtained utilizing NASCET criteria, using the distal internal carotid diameter as the denominator. CONTRAST:  Dose currently not available.  Reference EMR. COMPARISON:  Head CT earlier  today FINDINGS: CTA NECK FINDINGS Aortic arch: Normal.  Three vessel branching. Right carotid system: Smooth and widely patent. No atheromatous changes. Left carotid system: Smooth and widely patent. No atheromatous changes. Vertebral arteries: No proximal subclavian stenosis. Codominant vertebral arteries that are smooth and widely patent. Skeleton: No acute or aggressive finding. There is poor dentition with associated sclerosis of the alveolar ridge in both the mandible and maxilla. Other neck: No noted mass or inflammation. Upper chest: Negative Review of the MIP images confirms the above findings CTA HEAD FINDINGS Anterior circulation: Vessels are smooth and widely patent. Negative for aneurysm or beading. Posterior circulation: Codominant vertebral arteries. The vertebral and basilar arteries are widely patent. No branch occlusion or stenosis noted. Negative for beading or aneurysm. Venous sinuses: Patent Anatomic variants: Incomplete circle-of-Willis at the right posterior communicating artery. Delayed phase: Not performed in the emergent setting. Review of the MIP images confirms the above findings IMPRESSION: Negative CTA of the head and neck. Electronically Signed   By: Marnee Spring M.D.   On: 08/17/2017 09:12   Dg Chest Port 1 View  Result Date: 09/14/2017 CLINICAL DATA:  Chest pain EXAM: PORTABLE CHEST 1 VIEW COMPARISON:  09/07/2017 FINDINGS: Heart and mediastinal contours are within normal limits. No focal opacities or effusions. No acute bony abnormality. IMPRESSION: No active disease. Electronically Signed   By: Charlett Nose M.D.   On: 09/14/2017 12:05   Dg Chest Port 1 View  Result Date: 09/07/2017 CLINICAL DATA:  Altered mental status. EXAM: PORTABLE CHEST 1 VIEW COMPARISON:  08/07/2017, 08/06/2017 and 06/03/2017 FINDINGS: Lungs are adequately inflated without consolidation or effusion. Cardiomediastinal silhouette and remainder the exam is unchanged. IMPRESSION: No active disease.  Electronically Signed   By: Elberta Fortis M.D.   On: 09/07/2017 17:54   Dg Foot 2 Views Left  Result Date: 08/17/2017 CLINICAL DATA:  Diabetic with heel ulcer. EXAM: LEFT FOOT - 2 VIEW COMPARISON:  Radiographs 07/30/2017 and 07/06/2017. FINDINGS: There is apparent bandage material over the plantar aspect of the hindfoot and midfoot. Mild adjacent soft tissue irregularity is suggested on the lateral view, consistent with an ulcer. No definite soft tissue emphysema or bone destruction. There is no evidence of acute fracture or dislocation. The joint spaces  appear maintained. IMPRESSION: No osseous abnormality demonstrated. Plantar hindfoot soft tissue irregularity consistent with an ulcer. Electronically Signed   By: Carey Bullocks M.D.   On: 08/17/2017 11:47   Ct Head Code Stroke Wo Contrast  Result Date: 08/17/2017 CLINICAL DATA:  Code stroke.  Complete visual loss. EXAM: CT HEAD WITHOUT CONTRAST TECHNIQUE: Contiguous axial images were obtained from the base of the skull through the vertex without intravenous contrast. COMPARISON:  08/04/2017 FINDINGS: Brain: No evidence of acute infarction, hemorrhage, hydrocephalus, extra-axial collection or mass lesion/mass effect. Vascular: No hyperdense vessel or unexpected calcification. Skull: Normal. Negative for fracture or focal lesion. Sinuses/Orbits: No acute finding. Other: Text page with results sent on 08/17/2017 at 8:52 am to Dr. Amada Jupiter. ASPECTS [facility] Stroke Program Early CT Score) Not scored with this symptomatology. IMPRESSION: Negative head CT. Electronically Signed   By: Marnee Spring M.D.   On: 08/17/2017 08:53    Lab Data:  CBC:  Recent Labs Lab 09/07/17 1705 09/09/17 0603 09/10/17 0227 09/13/17 2028 09/13/17 2240 09/14/17 0915  WBC 36.1* 8.9 5.0 10.9*  --   --   NEUTROABS 31.8* 7.2 3.2 9.4*  --   --   HGB 11.7* 8.4* 9.0* 11.5* 12.6* 9.5*  HCT 39.0 23.7* 27.1* 36.0* 37.0* 28.0*  MCV 98.0 84.3 86.3 92.1  --   --   PLT 573*  251 211 365  --   --    Basic Metabolic Panel:  Recent Labs Lab 09/10/17 0227 09/10/17 1253 09/13/17 2100 09/13/17 2240 09/14/17 0346 09/14/17 0852 09/14/17 0915  NA 137 138 137 135 137 137 141  K 3.7 3.3* 5.6* 4.9 3.6 3.4* 3.6  CL 102 101 103 110 115* 115* 114*  CO2 30 32 <7*  --  12* 16*  --   GLUCOSE 306* 119* 471* 481* 177* 109* 113*  BUN 6 <5* 17 20 11 9 10   CREATININE 0.70 0.59* 1.78* 0.80 1.18 0.85 0.60*  CALCIUM 8.3* 8.4* 9.2  --  8.6* 8.6*  --   MG  --  1.8  --   --   --   --   --    GFR: Estimated Creatinine Clearance: 147.2 mL/min (A) (by C-G formula based on SCr of 0.6 mg/dL (L)). Liver Function Tests:  Recent Labs Lab 09/07/17 1705 09/13/17 2100 09/14/17 0346  AST 18 28 16   ALT 18 25 21   ALKPHOS 154* 102 78  BILITOT 2.9* 2.6* 0.8  PROT 6.9 6.3* 5.0*  ALBUMIN 4.1 3.5 3.0*    Recent Labs Lab 09/07/17 1705  LIPASE 162*   No results for input(s): AMMONIA in the last 168 hours. Coagulation Profile: No results for input(s): INR, PROTIME in the last 168 hours. Cardiac Enzymes:  Recent Labs Lab 09/14/17 0346  TROPONINI <0.03   BNP (last 3 results) No results for input(s): PROBNP in the last 8760 hours. HbA1C: No results for input(s): HGBA1C in the last 72 hours. CBG:  Recent Labs Lab 09/14/17 0727 09/14/17 0839 09/14/17 0943 09/14/17 1113 09/14/17 1253  GLUCAP 121* 102* 116* 147* 173*   Lipid Profile: No results for input(s): CHOL, HDL, LDLCALC, TRIG, CHOLHDL, LDLDIRECT in the last 72 hours. Thyroid Function Tests: No results for input(s): TSH, T4TOTAL, FREET4, T3FREE, THYROIDAB in the last 72 hours. Anemia Panel: No results for input(s): VITAMINB12, FOLATE, FERRITIN, TIBC, IRON, RETICCTPCT in the last 72 hours. Urine analysis:    Component Value Date/Time   COLORURINE STRAW (A) 09/14/2017 0054   APPEARANCEUR CLEAR 09/14/2017 0054  LABSPEC 1.011 09/14/2017 0054   PHURINE 5.0 09/14/2017 0054   GLUCOSEU >=500 (A) 09/14/2017 0054     HGBUR SMALL (A) 09/14/2017 0054   BILIRUBINUR NEGATIVE 09/14/2017 0054   BILIRUBINUR negative 03/29/2015 1114   KETONESUR 80 (A) 09/14/2017 0054   PROTEINUR 30 (A) 09/14/2017 0054   UROBILINOGEN 0.2 05/17/2015 1810   NITRITE NEGATIVE 09/14/2017 0054   LEUKOCYTESUR NEGATIVE 09/14/2017 0054     Axtyn Woehler M.D. [facility] 09/14/2017, 1:16 PM  Pager: [phone] Between 7am to 7pm - call Pager - [phone]  After 7pm go to [email or web address] - password [email or web address]  Call night coverage person covering after 7pm

## 2017-09-14 NOTE — ED Notes (Signed)
Pt CBG was 147, notified Hayley(RN)

## 2017-09-14 NOTE — ED Notes (Signed)
Asking patient to state name/dob/ and month to assess orientation patient responds with correct answers but stating "go away, leave me alone, im sleeping"

## 2017-09-14 NOTE — H&P (Signed)
History and Physical    [name]:[id number] DOB: 1986/05/26 DOA: 09/13/2017  PCP: Fleet Contras, MD   Patient coming from: Home  Chief Complaint: Chest pain, gen weakness, lethargy   HPI: [name] is a 31 y.o. male with medical history significant for insulin-dependent diabetes mellitus, schizophrenia, chronic anemia, poor adherence with his treatment plan, and GERD, now presenting to the emergency department for evaluation of chest pain, generalized weakness, and lethargy. Patient had just been discharged from the hospital 3 days earlier after an admission for diabetic ketoacidosis, but reports that he "can't take care of [him]self" and hasn't been taking his medications. He reports becoming very lethargic and sleeping most of the time since his discharge, and reports that he is too weak generally and too lethargic to take his medications. He denies headache, change in vision or hearing, or focal numbness or weakness. He reports chest pain, described as severe, constant, localized to the central chest, associated with indigestion and burping, similar to symptoms he had had previously that were aided by GI cocktail or Maalox. Patient called 911 today for transport to the hospital and was treated with 324 mg of aspirin prior to his arrival in the ED.  ED Course: Upon arrival to the ED, patient is found to be afebrile, saturating well on room air, tachypneic, tachycardic in the 130s, and with stable blood pressure. EKG features a sinus tachycardia with rate 133 and left axis deviation. Chest x-ray was refused by the patient. Chemistry panel reveals a potassium of 5.6, undetectable he low bicarbonate, and calculate anion gap, glucose 471, total bilirubin 2.6, and serum creatinine 1.78, up from 0.59 just 3 days earlier. CBC is notable for a slight leukocytosis to 10,900 and a normocytic anemia with hemoglobin of 11.5, up from 9.03 days ago. Troponin is within normal limits. Patient  was treated with a liter of normal saline, Zofran, Pepcid, and started on insulin infusion. He remained tachycardic and mildly tachypneic, but in no acute distress, and will be admitted to the stepdown unit for ongoing evaluation and management of diabetic ketoacidosis, suspected secondary to nonadherence with his medications.  Review of Systems:  All other systems reviewed and apart from HPI, are negative.  Past Medical History:  Diagnosis Date  . Anemia   . Anxiety   . Arthritis    "both hips; both shoulders" (06/06/2017)  . Chicken pox   . Childhood asthma   . Chronic pain   . Depression   . DKA (diabetic ketoacidoses) ([name]) 11/18/2014  . DKA (diabetic ketoacidoses) ([name]) 07/05/2017  . GERD (gastroesophageal reflux disease)   . Hypertension   . Migraine    "a few/year" (06/06/2017)  . Noncompliance with medication regimen   . Pneumonia    "several times" (06/06/2017)  . Polysubstance abuse   . Schizo affective schizophrenia ([name])   . Scoliosis   . Type I diabetes mellitus ([name])     Past Surgical History:  Procedure Laterality Date  . CARDIAC CATHETERIZATION  11/2016  . INCISION AND DRAINAGE ABSCESS Left 11/2011   "MRSA removed off my thumb"     reports that he has been smoking Cigarettes.  He has a 4.50 pack-year smoking history. He has never used smokeless tobacco. He reports that he uses drugs, including Marijuana, Cocaine, Methamphetamines, and "Crack" cocaine. He reports that he does not drink alcohol.  Allergies  Allergen Reactions  . Sulfa Antibiotics Other (See Comments)    Unknown childhood allergy  . Fentanyl Other (See  Comments)    Patient had hallucinations with the medication    Family History  Problem Relation Age of Onset  . Diabetes Mother      Prior to Admission medications   Medication Sig Start Date End Date Taking? Authorizing Provider  acetaminophen (TYLENOL) 500 MG tablet Take 1 tablet (500 mg total) by mouth every 6 (six) hours as needed.  09/10/17  Yes Maxie Barb, MD  insulin aspart (NOVOLOG FLEXPEN) 100 UNIT/ML FlexPen Inject 0-15 Units into the skin See admin instructions. Check Blood Sugar 4 times per day > with meals and at bedtime CBG 70 - 120: 0 units CBG 121 - 150: 2 units CBG 151 - 200: 3 units CBG 201 - 250: 5 units CBG 251 - 300: 8 units CBG 301 - 350: 11 units CBG 351 - 400: 15 units 09/10/17  Yes Maxie Barb, MD  amphetamine-dextroamphetamine (ADDERALL) 30 MG tablet Take 30 mg by mouth daily.  06/26/17   [provider]  collagenase (SANTYL) ointment Apply Santyl to left heel wound Q day, then cover with moist gauze and 4X4 and kerlex 09/10/17   Maxie Barb, MD  doxepin [facility]) 10 MG/ML solution Take 10 mLs by mouth at bedtime. 07/11/17   [provider]  insulin detemir (LEVEMIR) 100 UNIT/ML injection Inject 0.3 mLs (30 Units total) into the skin daily. 09/10/17   Maxie Barb, MD  metoCLOPramide (REGLAN) 10 MG tablet Take 10 mg by mouth 3 (three) times daily as needed. stomach 08/11/17   [provider]  pantoprazole (PROTONIX) 40 MG tablet Take 1 tablet (40 mg total) by mouth daily. 06/10/17   Kathlen Mody, MD  potassium chloride (K-DUR) 10 MEQ tablet Take 1 tablet (10 mEq total) by mouth daily. 09/10/17   Maxie Barb, MD  sennosides-docusate sodium (SENOKOT-S) 8.6-50 MG tablet Take 1 tablet by mouth as needed for constipation.    [provider]  traZODone (DESYREL) 150 MG tablet Take 150 mg by mouth at bedtime. 06/26/17   [provider]    Physical Exam: Vitals:   09/13/17 2151 09/13/17 2245 09/13/17 2300 09/14/17 0000  BP: 104/77 124/85 123/84 127/84  Pulse: (!) 136 (!) 142 (!) 141 (!) 134  Resp: (!) 22     Temp: 97.7 F (36.5 C)     TempSrc: Oral     SpO2: 98% 100% 100% 100%  Weight:      Height:          Constitutional: Tachypnea, in apparent discomfort, no pallor, no diaphoresis  Eyes: PERTLA, lids and  conjunctivae normal ENMT: Mucous membranes are dry. Posterior pharynx clear of any exudate or lesions.   Neck: normal, supple, no masses, no thyromegaly Respiratory: clear to auscultation bilaterally, tachypneic. No accessory muscle use.  Cardiovascular: Rate ~120 and regular. No extremity edema. No significant JVD. Abdomen: No distension, no tenderness, no masses palpated. Bowel sounds normal.  Musculoskeletal: no clubbing / cyanosis. No joint deformity upper and lower extremities.   Skin: Ulcer at plantar aspect left heel with slight odor, no significant drainage, and no surrounding erythema or tenderness. Poor turgor.  Neurologic: CN 2-12 grossly intact. Sensation intact. Strength 5/5 in all 4 limbs.  Psychiatric: Alert and oriented x 3. Labile emotions, intermittently cooperative.     Labs on Admission: I have personally reviewed following labs and imaging studies  CBC:  Recent Labs Lab 09/07/17 1705 09/09/17 0603 09/10/17 0227 09/13/17 2028 09/13/17 2240  WBC 36.1* 8.9 5.0 10.9*  --  NEUTROABS 31.8* 7.2 3.2 9.4*  --   HGB 11.7* 8.4* 9.0* 11.5* 12.6*  HCT 39.0 23.7* 27.1* 36.0* 37.0*  MCV 98.0 84.3 86.3 92.1  --   PLT 573* 251 211 365  --    Basic Metabolic Panel:  Recent Labs Lab 09/08/17 0635 09/09/17 0325 09/10/17 0227 09/10/17 1253 09/13/17 2100 09/13/17 2240  NA 136 138 137 138 137 135  K 3.5 3.1* 3.7 3.3* 5.6* 4.9  CL 110 103 102 101 103 110  CO2 17* 21* 30 32 <7*  --   GLUCOSE 140* 310* 306* 119* 471* 481*  BUN 21* 11 6 <5* 17 20  CREATININE 1.33* 1.23 0.70 0.59* 1.78* 0.80  CALCIUM 8.1* 8.0* 8.3* 8.4* 9.2  --   MG  --   --   --  1.8  --   --    GFR: Estimated Creatinine Clearance: 147.2 mL/min (by C-G formula based on SCr of 0.8 mg/dL). Liver Function Tests:  Recent Labs Lab 09/07/17 1705 09/13/17 2100  AST 18 28  ALT 18 25  ALKPHOS 154* 102  BILITOT 2.9* 2.6*  PROT 6.9 6.3*  ALBUMIN 4.1 3.5    Recent Labs Lab 09/07/17 1705  LIPASE  162*   No results for input(s): AMMONIA in the last 168 hours. Coagulation Profile: No results for input(s): INR, PROTIME in the last 168 hours. Cardiac Enzymes: No results for input(s): CKTOTAL, CKMB, CKMBINDEX, TROPONINI in the last 168 hours. BNP (last 3 results) No results for input(s): PROBNP in the last 8760 hours. HbA1C: No results for input(s): HGBA1C in the last 72 hours. CBG:  Recent Labs Lab 09/10/17 0528 09/10/17 0825 09/10/17 1249 09/13/17 2103 09/13/17 2322  GLUCAP 328* 337* 123* 414* 368*   Lipid Profile: No results for input(s): CHOL, HDL, LDLCALC, TRIG, CHOLHDL, LDLDIRECT in the last 72 hours. Thyroid Function Tests: No results for input(s): TSH, T4TOTAL, FREET4, T3FREE, THYROIDAB in the last 72 hours. Anemia Panel: No results for input(s): VITAMINB12, FOLATE, FERRITIN, TIBC, IRON, RETICCTPCT in the last 72 hours. Urine analysis:    Component Value Date/Time   COLORURINE STRAW (A) 09/07/2017 2041   APPEARANCEUR CLEAR 09/07/2017 2041   LABSPEC 1.013 09/07/2017 2041   PHURINE 5.0 09/07/2017 2041   GLUCOSEU >=500 (A) 09/07/2017 2041   HGBUR SMALL (A) 09/07/2017 2041   BILIRUBINUR NEGATIVE 09/07/2017 2041   BILIRUBINUR negative 03/29/2015 1114   KETONESUR 80 (A) 09/07/2017 2041   PROTEINUR 30 (A) 09/07/2017 2041   UROBILINOGEN 0.2 05/17/2015 1810   NITRITE NEGATIVE 09/07/2017 2041   LEUKOCYTESUR NEGATIVE 09/07/2017 2041   Sepsis Labs: @LABRCNTIP (procalcitonin:4,lacticidven:4) ) Recent Results (from the past 240 hour(s))  MRSA PCR Screening     Status: None   Collection Time: 09/07/17  9:20 PM  Result Value Ref Range Status   MRSA by PCR NEGATIVE NEGATIVE Final    Comment:        The GeneXpert MRSA Assay (FDA approved for NASAL specimens only), is one component of a comprehensive MRSA colonization surveillance program. It is not intended to diagnose MRSA infection nor to guide or monitor treatment for MRSA infections.      Radiological  Exams on Admission: No results found.  EKG: Independently reviewed. Sinus tachycardia (rate 133), LAD.   Assessment/Plan  1. DKA, insulin-dependent DM  - Pt presents in DKA, likely secondary to not taking his medications after the recent discharge  - He refused some of the ED workup, refused to speak with pharmacy tech, making demands, and  has been very difficult to evaluate and treat due to his behavior  - He was given a liter of NS and started on insulin infusion in ED  - Plan to bolus a second liter, continue insulin infusion with frequent CBG's and serial chem panels    2. Chest pain   - Suspected secondary to indigestion  - Treat with GI cocktail, H2-blocker  - Continue cardiac monitoring, obtain serial troponin measurements, and repeat EKG in am    3. Hyperkalemia  - Serum potassium is 5.6 on admission without EKG change  - Secondary to DKA with AKI and anticipate resolution with IVF and insulin  - Continue cardiac monitoring, IVF, insulin infusion, and serial chem panels  4. Acute kidney injury - SCr is 1.78 on admission, up from 0.59 three days earlier  - Likely prerenal in setting of DKA and clinical hypovolemia  - Anticipate resolution with treatment of DKA as above  - Following serial chem panels  5. Hyperbilirubinemia  - Total bilirubin is 2.6 on admission, improved from recent prior  - No significant RUQ tenderness and remaining LFT's wnl  - Repeat CMP in am   6. Normocytic anemia - Hgb is 11.5 on admission, up from 9.0 three days earlier - No bleeding evident, anticipate dilutional drop with above treatments   7. Schizophrenia  - Pt seems to neglect self at home, is very uncooperative in hospital  - Unless his mental illness can be brought under better control, he has a very poor long-term prognosis  - Continue doxepin and trazodone  - Consult social work   8. Heel ulcer  - Large left plantar heel ulcer present on arrival  - No signs of active infection -  Seen by wound care nurse three days prior to this admission; continue neosporin and daily dressing change per their recommendation     DVT prophylaxis: sq heparin  Code Status: Full  Family Communication: Discussed with patient  Disposition Plan: Admit to SDU Consults called: None Admission status: Inpatient    Briscoe Deutscher, MD [facility] Pager [phone]  If 7PM-7AM, please contact night-coverage [email or web address] Password [facility]  09/14/2017, 12:28 AM

## 2017-09-14 NOTE — ED Notes (Addendum)
Patient asking for IV in hand to be taken out. Rationale explained as to why he needs multiple IVs. Patient reports " I will rip IV out if you don't take it out". Patient advised not to take IV out.

## 2017-09-14 NOTE — Clinical Social Work Note (Signed)
Clinical Social Work Assessment  Patient Details  Name: [name] MRN: [id number] Date of Birth: 10/23/1986  Date of referral:  09/14/17               Reason for consult:  Mental Health Concerns                Permission sought to share information with:  Case Manager Permission granted to share information::  Yes, Verbal Permission Granted  Name::     Reeves Forth  Agency::     Relationship::     Contact Information:     Housing/Transportation Living arrangements for the past 2 months:  Single Family Home (with roommate Leonette Most) Source of Information:  Patient Patient Interpreter Needed:  None Criminal Activity/Legal Involvement Pertinent to Current Situation/Hospitalization:  No - Comment as needed Significant Relationships:  None Lives with:  Roommate Leonette Most) Do you feel safe going back to the place where you live?  Yes Need for family participation in patient care:  Yes (Comment)  Care giving concerns:  CSW spoke with pt at bedside. Pt has no concerns as pt reports at this time.    Social Worker assessment / plan:  CSW spoke with pt at bedside. During this time CSW was informed that pt is from home with pt's roommate Leonette Most. Pt mentioned that pt and roommate have lived together for 6 months now. Pt is intrested in SNF placement as pt notified CSW that pt and pt's doctor had discussed placing pt in a facility until pt is better. Pt reports that pt has no supports as pt's mother and father are not in the picture. Pt doe snot consider Leonette Most to be a support because Leonette Most is said to be blind and has help for himself.    Employment status:  Other (Comment) (unknown ) Insurance information:  Medicare PT Recommendations:  Not assessed at this time Information / Referral to community resources:     Patient/Family's Response to care:  Pt is understanding and agreeable to plan of care at this time.     Patient/Family's Understanding of and Emotional Response to  Diagnosis, Current Treatment, and Prognosis:  No further questions or concerns have been presented to CSW at this time. CSW will continue to follow for discharge needs.  Emotional Assessment Appearance:  Appears stated age Attitude/Demeanor/Rapport:    Affect (typically observed):  Pleasant Orientation:  Oriented to Self, Oriented to Place, Oriented to  Time, Oriented to Situation Alcohol / Substance use:  Illicit Drugs Psych involvement (Current and /or in the community):  No (Comment) (not at this time, bu tmay become involved as needed. )  Discharge Needs  Concerns to be addressed:  No discharge needs identified Readmission within the last 30 days:  No Current discharge risk:  None Barriers to Discharge:  No Barriers Identified   Robb Matar, LCSWA 09/14/2017, 9:07 AM

## 2017-09-14 NOTE — ED Notes (Signed)
Dr. Isidoro Donning verbally orders to start insulin drip back at 0.4 untis/hr.

## 2017-09-14 NOTE — Consult Note (Addendum)
WOC Nurse wound consult note Consult requested for left heel; this was recently performed on 9/24 when pt was admitted.  Refer to previous consult note, wound not visualized today. He developed an unstageable pressure injury when unresponsive at home about a month ago.  Wound type: Wound has evolved into Stage 3 pressure injury Dressing procedure/placement/frequency: Neosporin to promote moist healing and gauze and kerlex to protect; float heel to reduce pressure.   Please re-consult if further assistance is needed.  Thank-you,  Cammie Mcgee MSN, RN, CWOCN, [place], CNS [phone]

## 2017-09-15 DIAGNOSIS — E0810 Diabetes mellitus due to underlying condition with ketoacidosis without coma: Secondary | ICD-10-CM | POA: Diagnosis not present

## 2017-09-15 DIAGNOSIS — L97422 Non-pressure chronic ulcer of left heel and midfoot with fat layer exposed: Secondary | ICD-10-CM | POA: Diagnosis not present

## 2017-09-15 DIAGNOSIS — E1010 Type 1 diabetes mellitus with ketoacidosis without coma: Secondary | ICD-10-CM | POA: Diagnosis not present

## 2017-09-15 DIAGNOSIS — N179 Acute kidney failure, unspecified: Secondary | ICD-10-CM | POA: Diagnosis not present

## 2017-09-15 DIAGNOSIS — R531 Weakness: Secondary | ICD-10-CM | POA: Diagnosis not present

## 2017-09-15 LAB — BASIC METABOLIC PANEL
ANION GAP: 7 (ref 5–15)
CHLORIDE: 108 mmol/L (ref 101–111)
CO2: 22 mmol/L (ref 22–32)
Calcium: 8.5 mg/dL — ABNORMAL LOW (ref 8.9–10.3)
Creatinine, Ser: 0.62 mg/dL (ref 0.61–1.24)
Glucose, Bld: 186 mg/dL — ABNORMAL HIGH (ref 65–99)
POTASSIUM: 3.2 mmol/L — AB (ref 3.5–5.1)
SODIUM: 137 mmol/L (ref 135–145)

## 2017-09-15 LAB — GLUCOSE, CAPILLARY
GLUCOSE-CAPILLARY: 164 mg/dL — AB (ref 65–99)
GLUCOSE-CAPILLARY: 350 mg/dL — AB (ref 65–99)
GLUCOSE-CAPILLARY: 332 mg/dL — AB (ref 65–99)
GLUCOSE-CAPILLARY: 198 mg/dL — AB (ref 65–99)

## 2017-09-15 MED ORDER — INSULIN ASPART 100 UNIT/ML FLEXPEN: 0.0000 [IU] | PEN_INJECTOR | SUBCUTANEOUS | 0 refills | Status: DC

## 2017-09-15 MED ORDER — PANTOPRAZOLE SODIUM 40 MG PO TBEC: 40.0000 mg | DELAYED_RELEASE_TABLET | Freq: Every day | ORAL | 1 refills | Status: DC

## 2017-09-15 MED ORDER — METOCLOPRAMIDE HCL 10 MG PO TABS: 10.0000 mg | ORAL_TABLET | Freq: Three times a day (TID) | ORAL | 0 refills | Status: DC | PRN

## 2017-09-15 MED ORDER — INSULIN ASPART 100 UNIT/ML ~~LOC~~ SOLN
3.0000 [IU] | Freq: Three times a day (TID) | SUBCUTANEOUS | Status: DC
  Administered 2017-09-15 (×2): 3 [IU] via SUBCUTANEOUS

## 2017-09-15 MED ORDER — BACITRACIN-NEOMYCIN-POLYMYXIN OINTMENT TUBE: 1.0000 "application " | TOPICAL_OINTMENT | Freq: Every day | CUTANEOUS | 3 refills | Status: DC

## 2017-09-15 MED ORDER — INSULIN DETEMIR 100 UNIT/ML ~~LOC~~ SOLN: 30.0000 [IU] | Freq: Every day | SUBCUTANEOUS | 3 refills | Status: DC

## 2017-09-15 MED ORDER — INSULIN GLARGINE 100 UNIT/ML ~~LOC~~ SOLN
30.0000 [IU] | Freq: Every day | SUBCUTANEOUS | Status: DC
  Administered 2017-09-15: 30 [IU] via SUBCUTANEOUS
  Filled 2017-09-15: qty 0.3

## 2017-09-15 NOTE — Progress Notes (Signed)
[facility]                                                                              Patient Demographics  [name], is a 31 y.o. male, DOB - Jun 26, 1986, ZOX:[id number]  Admit date - 09/13/2017   Admitting Physician Briscoe Deutscher, MD  Outpatient Primary MD for the patient is Fleet Contras, MD  Outpatient specialists:   LOS - 1  days   Medical records reviewed and are as summarized below:    Chief Complaint  Patient presents with  . Chest Pain  . Hyperglycemia       Brief summary    Patient is a 31 year old male with insulin-dependent diabetes mellitus, schizophrenia, chronic anemia, noncompliant, GERD presented to ED with multiple complaints including chest pain, generalized weakness, lethargy. Patient ad been just discharged from the hospital on 9/24, admitted for DKA, reported that he can't take care of himself and had not been taking his medications. In ED,patient was found to have serum creatinine of 1.7 (0.59 on 9/24), glucose 471, undetectable low bicarbonate, potassium 5.6, AG close to 30   Assessment & Plan    Principal Problem:   DKA (diabetic ketoacidoses) ([name]) - likely due to severe noncompliance, not taking his medication after the recent discharge. Refusing treatment in ED - Patient was placed on aggressive IV fluid hydration, insulin infusion -  Hemoglobin A1c 7.1 on 9/28 - noncompliant with diet (even during the hospitalization) and insulin - CBGs elevated increased Lantus to 30 units daily (outpatient dose), added NovoLog 3 units 3 times a day, sliding scale insulin - patient states that he did not take insulin after the discharge from the previous admission as he did not fill his prescriptions. 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 If cleared from psychiatry standpoint and no acute changes in medications, likely DC home today    Hyperkalemia - Serum potassium 5.6 on admission secondary to DKA, AK I - improved, refusing BMET    AKI (acute kidney injury) ([name]) - Presented with a creatinine 1.7 on admission, 0.59 on 9/24 - creatinine improved, 0.6 on 9/28,refusing labs today    Normocytic anemia - total bilirubin 2.6 on admission, improved from recent prior, no significant right upper quadrant tenderness, remaining LFTs within normal limits    Left Heel ulcer ([name]) - wound care consulted  Anemia of chronic disease - Baseline 8-9 - Currently stable  Code Status: full  DVT Prophylaxis:  Heparin subcutaneous Family Communication: Discussed in detail with the patient, all imaging results, lab results explained to the patient  Disposition Plan: possible DC home today if cleared by psychiatry  Time Spent in minutes   25 minutes  Procedures:  none   Consultants:   psychiatry  Antimicrobials:      Medications  Scheduled Meds: . doxepin  100 mg Oral QHS  . heparin  5,000 Units Subcutaneous Q8H  . insulin  aspart  0-5 Units Subcutaneous QHS  . insulin aspart  0-9 Units Subcutaneous TID WC  . insulin aspart  3 Units Subcutaneous TID WC  . insulin glargine  30 Units Subcutaneous Daily  . metoCLOPramide (REGLAN) injection  5 mg Intravenous Q8H  . neomycin-bacitracin-polymyxin   Topical Daily  . pantoprazole  40 mg Oral Daily  . traZODone  150 mg Oral QHS   Continuous Infusions: . sodium chloride Stopped (09/14/17 2041)  . dextrose 5 % and 0.45% NaCl 100 mL/hr at 09/14/17 2340   PRN Meds:.acetaminophen, gi cocktail, ondansetron (ZOFRAN) IV, oxyCODONE, senna-docusate   Antibiotics   Anti-infectives    None        Subjective:   [name] was seen and examined today. Irritable and hostile towards staff, refusing  care, labs. Patient denies dizziness, chest pain, shortness of breath, abdominal pain, N/V/D/C, new weakness, numbess, tingling.  Objective:   Vitals:   09/14/17 1745 09/14/17 2114 09/15/17 0532 09/15/17 0908  BP: (!) 153/97 (!) 158/94 (!) 144/87 119/81  Pulse: (!) 122 (!) 124 (!) 106 (!) 112  Resp: 18 18 17 16   Temp:  98.2 F (36.8 C) 97.6 F (36.4 C) 98.5 F (36.9 C)  TempSrc:  Oral Oral Oral  SpO2: 100% 100% 100% 100%  Weight:      Height:        Intake/Output Summary (Last 24 hours) at 09/15/17 1156 Last data filed at 09/15/17 1053  Gross per 24 hour  Intake             1280 ml  Output                0 ml  Net             1280 ml     Wt Readings from Last 3 Encounters:  09/13/17 78.5 kg (173 lb)  09/07/17 78.1 kg (172 lb 2.9 oz)  08/17/17 89 kg (196 lb 3.4 oz)     Exam  General: Alert and oriented x 3, NAD  Eyes:   HEENT:   Cardiovascular: S1 S2 auscultated, RRR No pedal edema b/l  Respiratory: Clear to auscultation bilaterally, no wheezing, rales or rhonchi  Gastrointestinal: Soft, nontender, nondistended, + bowel sounds  Ext: no pedal edema bilaterally  Neuro: no neuro deficits  Musculoskeletal: No digital cyanosis, clubbing  Skin: No rashes  Psych: irritable, alert and oriented x3    Data Reviewed:  I have personally reviewed following labs and imaging studies  Micro Results Recent Results (from the past 240 hour(s))  MRSA PCR Screening     Status: None   Collection Time: 09/07/17  9:20 PM  Result Value Ref Range Status   MRSA by PCR NEGATIVE NEGATIVE Final    Comment:        The GeneXpert MRSA Assay (FDA approved for NASAL specimens only), is one component of a comprehensive MRSA colonization surveillance program. It is not intended to diagnose MRSA infection nor to guide or monitor treatment for MRSA infections.     Radiology Reports Ct Angio Head W Or Wo Contrast  Result Date: 08/17/2017 CLINICAL DATA:  Code stroke.   Complete visual loss.  Hyperglycemia. EXAM: CT ANGIOGRAPHY HEAD AND NECK TECHNIQUE: Multidetector CT imaging of the head and neck was performed using the standard protocol during bolus administration of intravenous contrast. Multiplanar CT image reconstructions and MIPs were obtained to evaluate the vascular anatomy. Carotid stenosis measurements (when applicable) are obtained utilizing NASCET criteria, using the distal  internal carotid diameter as the denominator. CONTRAST:  Dose currently not available.  Reference EMR. COMPARISON:  Head CT earlier today FINDINGS: CTA NECK FINDINGS Aortic arch: Normal.  Three vessel branching. Right carotid system: Smooth and widely patent. No atheromatous changes. Left carotid system: Smooth and widely patent. No atheromatous changes. Vertebral arteries: No proximal subclavian stenosis. Codominant vertebral arteries that are smooth and widely patent. Skeleton: No acute or aggressive finding. There is poor dentition with associated sclerosis of the alveolar ridge in both the mandible and maxilla. Other neck: No noted mass or inflammation. Upper chest: Negative Review of the MIP images confirms the above findings CTA HEAD FINDINGS Anterior circulation: Vessels are smooth and widely patent. Negative for aneurysm or beading. Posterior circulation: Codominant vertebral arteries. The vertebral and basilar arteries are widely patent. No branch occlusion or stenosis noted. Negative for beading or aneurysm. Venous sinuses: Patent Anatomic variants: Incomplete circle-of-Willis at the right posterior communicating artery. Delayed phase: Not performed in the emergent setting. Review of the MIP images confirms the above findings IMPRESSION: Negative CTA of the head and neck. Electronically Signed   By: Marnee Spring M.D.   On: 08/17/2017 09:12   Ct Angio Neck W Or Wo Contrast  Result Date: 08/17/2017 CLINICAL DATA:  Code stroke.  Complete visual loss.  Hyperglycemia. EXAM: CT ANGIOGRAPHY  HEAD AND NECK TECHNIQUE: Multidetector CT imaging of the head and neck was performed using the standard protocol during bolus administration of intravenous contrast. Multiplanar CT image reconstructions and MIPs were obtained to evaluate the vascular anatomy. Carotid stenosis measurements (when applicable) are obtained utilizing NASCET criteria, using the distal internal carotid diameter as the denominator. CONTRAST:  Dose currently not available.  Reference EMR. COMPARISON:  Head CT earlier today FINDINGS: CTA NECK FINDINGS Aortic arch: Normal.  Three vessel branching. Right carotid system: Smooth and widely patent. No atheromatous changes. Left carotid system: Smooth and widely patent. No atheromatous changes. Vertebral arteries: No proximal subclavian stenosis. Codominant vertebral arteries that are smooth and widely patent. Skeleton: No acute or aggressive finding. There is poor dentition with associated sclerosis of the alveolar ridge in both the mandible and maxilla. Other neck: No noted mass or inflammation. Upper chest: Negative Review of the MIP images confirms the above findings CTA HEAD FINDINGS Anterior circulation: Vessels are smooth and widely patent. Negative for aneurysm or beading. Posterior circulation: Codominant vertebral arteries. The vertebral and basilar arteries are widely patent. No branch occlusion or stenosis noted. Negative for beading or aneurysm. Venous sinuses: Patent Anatomic variants: Incomplete circle-of-Willis at the right posterior communicating artery. Delayed phase: Not performed in the emergent setting. Review of the MIP images confirms the above findings IMPRESSION: Negative CTA of the head and neck. Electronically Signed   By: Marnee Spring M.D.   On: 08/17/2017 09:12   Dg Chest Port 1 View  Result Date: 09/14/2017 CLINICAL DATA:  Chest pain EXAM: PORTABLE CHEST 1 VIEW COMPARISON:  09/07/2017 FINDINGS: Heart and mediastinal contours are within normal limits. No focal  opacities or effusions. No acute bony abnormality. IMPRESSION: No active disease. Electronically Signed   By: Charlett Nose M.D.   On: 09/14/2017 12:05   Dg Chest Port 1 View  Result Date: 09/07/2017 CLINICAL DATA:  Altered mental status. EXAM: PORTABLE CHEST 1 VIEW COMPARISON:  08/07/2017, 08/06/2017 and 06/03/2017 FINDINGS: Lungs are adequately inflated without consolidation or effusion. Cardiomediastinal silhouette and remainder the exam is unchanged. IMPRESSION: No active disease. Electronically Signed   By: Elberta Fortis M.D.  On: 09/07/2017 17:54   Dg Foot 2 Views Left  Result Date: 08/17/2017 CLINICAL DATA:  Diabetic with heel ulcer. EXAM: LEFT FOOT - 2 VIEW COMPARISON:  Radiographs 07/30/2017 and 07/06/2017. FINDINGS: There is apparent bandage material over the plantar aspect of the hindfoot and midfoot. Mild adjacent soft tissue irregularity is suggested on the lateral view, consistent with an ulcer. No definite soft tissue emphysema or bone destruction. There is no evidence of acute fracture or dislocation. The joint spaces appear maintained. IMPRESSION: No osseous abnormality demonstrated. Plantar hindfoot soft tissue irregularity consistent with an ulcer. Electronically Signed   By: Carey Bullocks M.D.   On: 08/17/2017 11:47   Ct Head Code Stroke Wo Contrast  Result Date: 08/17/2017 CLINICAL DATA:  Code stroke.  Complete visual loss. EXAM: CT HEAD WITHOUT CONTRAST TECHNIQUE: Contiguous axial images were obtained from the base of the skull through the vertex without intravenous contrast. COMPARISON:  08/04/2017 FINDINGS: Brain: No evidence of acute infarction, hemorrhage, hydrocephalus, extra-axial collection or mass lesion/mass effect. Vascular: No hyperdense vessel or unexpected calcification. Skull: Normal. Negative for fracture or focal lesion. Sinuses/Orbits: No acute finding. Other: Text page with results sent on 08/17/2017 at 8:52 am to Dr. Amada Jupiter. ASPECTS [facility] Stroke Program  Early CT Score) Not scored with this symptomatology. IMPRESSION: Negative head CT. Electronically Signed   By: Marnee Spring M.D.   On: 08/17/2017 08:53    Lab Data:  CBC:  Recent Labs Lab 09/09/17 0603 09/10/17 0227 09/13/17 2028 09/13/17 2240 09/14/17 0915  WBC 8.9 5.0 10.9*  --   --   NEUTROABS 7.2 3.2 9.4*  --   --   HGB 8.4* 9.0* 11.5* 12.6* 9.5*  HCT 23.7* 27.1* 36.0* 37.0* 28.0*  MCV 84.3 86.3 92.1  --   --   PLT 251 211 365  --   --    Basic Metabolic Panel:  Recent Labs Lab 09/10/17 1253 09/13/17 2100  09/14/17 0346 09/14/17 0852 09/14/17 0915 09/14/17 1257 09/14/17 2007  NA 138 137  < > 137 137 141 136 136  K 3.3* 5.6*  < > 3.6 3.4* 3.6 4.0 3.3*  CL 101 103  < > 115* 115* 114* 114* 111  CO2 32 <7*  --  12* 16*  --  11* 16*  GLUCOSE 119* 471*  < > 177* 109* 113* 162* 147*  BUN <5* 17  < > 11 9 10 9  5*  CREATININE 0.59* 1.78*  < > 1.18 0.85 0.60* 1.00 0.82  CALCIUM 8.4* 9.2  --  8.6* 8.6*  --  8.5* 8.5*  MG 1.8  --   --   --   --   --   --   --   < > = values in this interval not displayed. GFR: Estimated Creatinine Clearance: 143.6 mL/min (by C-G formula based on SCr of 0.82 mg/dL). Liver Function Tests:  Recent Labs Lab 09/13/17 2100 09/14/17 0346  AST 28 16  ALT 25 21  ALKPHOS 102 78  BILITOT 2.6* 0.8  PROT 6.3* 5.0*  ALBUMIN 3.5 3.0*   No results for input(s): LIPASE, AMYLASE in the last 168 hours. No results for input(s): AMMONIA in the last 168 hours. Coagulation Profile: No results for input(s): INR, PROTIME in the last 168 hours. Cardiac Enzymes:  Recent Labs Lab 09/14/17 0346 09/14/17 1257  TROPONINI <0.03 <0.03   BNP (last 3 results) No results for input(s): PROBNP in the last 8760 hours. HbA1C:  Recent Labs  09/14/17 1035  HGBA1C 7.1*   CBG:  Recent Labs Lab 09/14/17 1253 09/14/17 1707 09/14/17 2116 09/15/17 0536 09/15/17 0754  GLUCAP 173* 132* 164* 350* 332*   Lipid Profile: No results for input(s): CHOL,  HDL, LDLCALC, TRIG, CHOLHDL, LDLDIRECT in the last 72 hours. Thyroid Function Tests: No results for input(s): TSH, T4TOTAL, FREET4, T3FREE, THYROIDAB in the last 72 hours. Anemia Panel: No results for input(s): VITAMINB12, FOLATE, FERRITIN, TIBC, IRON, RETICCTPCT in the last 72 hours. Urine analysis:    Component Value Date/Time   COLORURINE STRAW (A) 09/14/2017 0054   APPEARANCEUR CLEAR 09/14/2017 0054   LABSPEC 1.011 09/14/2017 0054   PHURINE 5.0 09/14/2017 0054   GLUCOSEU >=500 (A) 09/14/2017 0054   HGBUR SMALL (A) 09/14/2017 0054   BILIRUBINUR NEGATIVE 09/14/2017 0054   BILIRUBINUR negative 03/29/2015 1114   KETONESUR 80 (A) 09/14/2017 0054   PROTEINUR 30 (A) 09/14/2017 0054   UROBILINOGEN 0.2 05/17/2015 1810   NITRITE NEGATIVE 09/14/2017 0054   LEUKOCYTESUR NEGATIVE 09/14/2017 0054     Ripudeep Rai M.D. [facility] 09/15/2017, 11:56 AM  Pager: [phone] Between 7am to 7pm - call Pager - [phone]  After 7pm go to [email or web address] - password [email or web address]  Call night coverage person covering after 7pm

## 2017-09-15 NOTE — Clinical Social Work Note (Addendum)
CSW consulted for transportation. Pt has no funding or family/friends to pick him up. Taxi voucher granted to pt due to pt's address not being accessible to public transportation.  CSW acknowledges consult for behavior health resources, however pt has refused psych assessment.  Pt has no other identified DC needs.  Lovelle Lema B. Gean Quint Clinical Social Work Dept Weekend Social Worker [phone] 4:15 PM

## 2017-09-15 NOTE — Discharge Summary (Signed)
Physician Discharge Summary   Patient ID: [name] MRN: [id number] DOB/AGE: 1986/06/18 31 y.o.  Admit date: 09/13/2017 Discharge date: 09/15/2017  Primary Care Physician:  Fleet Contras, MD  Discharge Diagnoses:    . AKI (acute kidney injury) ([name]) . DKA (diabetic ketoacidoses) ([name]) . Hyperkalemia . Normocytic anemia . Undifferentiated schizophrenia ([name]) . Hyperbilirubinemia . Heel ulcer ([name])   Consults:  Psychiatry   Recommendations for Outpatient Follow-up:  1. Patient needs to be compliant with his insulin, He has 10 admissions in last 6 months due to severe non compliance with diet and insulin.  2. Please repeat CBC/BMET at next visit 3. Patient will benefit from psychiatry referral   DIET: carb modified diet    Allergies:   Allergies  Allergen Reactions  . Sulfa Antibiotics Other (See Comments)    Unknown childhood allergy  . Fentanyl Other (See Comments)    Patient had hallucinations with the medication     DISCHARGE MEDICATIONS: Current Discharge Medication List    START taking these medications   Details  neomycin-bacitracin-polymyxin (NEOSPORIN) OINT Apply 1 application topically daily. Apply Neosporin to left heel daily, then cover with gauze and kerlex Qty: 14 Tube, Refills: 3      CONTINUE these medications which have CHANGED   Details  insulin aspart (NOVOLOG FLEXPEN) 100 UNIT/ML FlexPen Inject 0-15 Units into the skin See admin instructions. Check Blood Sugar 4 times per day > with meals and at bedtime CBG 70 - 120: 0 units CBG 121 - 150: 2 units CBG 151 - 200: 3 units CBG 201 - 250: 5 units CBG 251 - 300: 8 units CBG 301 - 350: 11 units CBG 351 - 400: 15 units Qty: 10 mL, Refills: 0    insulin detemir (LEVEMIR) 100 UNIT/ML injection Inject 0.3 mLs (30 Units total) into the skin daily. Qty: 10 mL, Refills: 3    metoCLOPramide (REGLAN) 10 MG tablet Take 1 tablet (10 mg total) by mouth 3 (three) times daily as needed for  nausea or vomiting. Qty: 30 tablet, Refills: 0    pantoprazole (PROTONIX) 40 MG tablet Take 1 tablet (40 mg total) by mouth daily. Qty: 30 tablet, Refills: 1      CONTINUE these medications which have NOT CHANGED   Details  acetaminophen (TYLENOL) 500 MG tablet Take 1 tablet (500 mg total) by mouth every 6 (six) hours as needed. Qty: 30 tablet, Refills: 0    amphetamine-dextroamphetamine (ADDERALL) 30 MG tablet Take 30 mg by mouth daily.  Refills: 0    doxepin (SINEQUAN) 10 MG/ML solution Take 10 mLs by mouth at bedtime. Refills: 2    sennosides-docusate sodium (SENOKOT-S) 8.6-50 MG tablet Take 1 tablet by mouth as needed for constipation.    traZODone (DESYREL) 150 MG tablet Take 150 mg by mouth at bedtime. Refills: 0    potassium chloride (K-DUR) 10 MEQ tablet Take 1 tablet (10 mEq total) by mouth daily. Qty: 20 tablet, Refills: 0         Brief H and P: For complete details please refer to admission H and P, but in brief Patient is a 31 year old male with insulin-dependent diabetes mellitus, schizophrenia, chronic anemia, noncompliant, GERD presented to ED with multiple complaints including chest pain, generalized weakness, lethargy. Patient ad been just discharged from the hospital on 9/24, admitted for DKA, reported that he can't take care of himself and had not been taking his medications. In ED,patient was found to have serum creatinine of 1.7 (0.59 on 9/24), glucose  471, undetectable low bicarbonate, potassium 5.6, AG close to 30  Hospital Course:  DKA (diabetic ketoacidoses) ([name]) - likely due to severe noncompliance, not taking his medication after the recent discharge. Refusing treatment in ED - Patient was placed on aggressive IV fluid hydration, insulin infusion -  Hemoglobin A1c 7.1 on 9/28 - severely noncompliant with diet (even during the hospitalization) and insulin, refused blood draws, CBG checks  - CBGs elevated increased Lantus to 30 units daily (outpatient  dose),continue novolog sliding scale  - patient states that he did not take insulin after the discharge from the previous admission as he did not fill his prescriptions. He states now he has the money, he'll be able to get his prescription filled. 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, unable to take care of himself, resistant to medical care -  Continue doxepin, trazodone - psychiatry was consulted, however patient refused to talk to the psychiatrist     Hyperkalemia - Serum potassium 5.6 on admission secondary to DKA, AK I - improved    AKI (acute kidney injury) ([name]) - Presented with a creatinine 1.7 on admission, 0.59 on 9/24 - creatinine improved, 0.6     Normocytic anemia - total bilirubin 2.6 on admission, improved from recent prior, no significant right upper quadrant tenderness, remaining LFTs within normal limits    Left Heel ulcer ([name]) - wound care was consulted, recommended neosporin   Anemia of chronic disease - Baseline 8-9 - Currently stable  Day of Discharge BP 130/88 (BP Location: Right Arm)   Pulse (!) 105   Temp 98.4 F (36.9 C) (Oral)   Resp 17   Ht 6\' 1"  (1.854 m)   Wt 78.5 kg (173 lb)   SpO2 99%   BMI 22.82 kg/m   Physical Exam: General: Alert and awake oriented x3 not in any acute distress. HEENT: anicteric sclera, pupils reactive to light and accommodation CVS: S1-S2 clear no murmur rubs or gallops Chest: clear to auscultation bilaterally, no wheezing rales or rhonchi Abdomen: soft nontender, nondistended, normal bowel sounds Extremities: no cyanosis, clubbing or edema noted bilaterally Neuro: Cranial nerves II-XII intact, no focal neurological deficits   The results of significant diagnostics from this hospitalization (including imaging, microbiology, ancillary and laboratory) are listed below for reference.    LAB RESULTS: Basic Metabolic Panel:  Recent  Labs Lab 09/10/17 1253  09/14/17 2007 09/15/17 1115  NA 138  < > 136 137  K 3.3*  < > 3.3* 3.2*  CL 101  < > 111 108  CO2 32  < > 16* 22  GLUCOSE 119*  < > 147* 186*  BUN <5*  < > 5* <5*  CREATININE 0.59*  < > 0.82 0.62  CALCIUM 8.4*  < > 8.5* 8.5*  MG 1.8  --   --   --   < > = values in this interval not displayed. Liver Function Tests:  Recent Labs Lab 09/13/17 2100 09/14/17 0346  AST 28 16  ALT 25 21  ALKPHOS 102 78  BILITOT 2.6* 0.8  PROT 6.3* 5.0*  ALBUMIN 3.5 3.0*   No results for input(s): LIPASE, AMYLASE in the last 168 hours. No results for input(s): AMMONIA in the last 168 hours. CBC:  Recent Labs Lab 09/10/17 0227 09/13/17 2028 09/13/17 2240 09/14/17 0915  WBC 5.0 10.9*  --   --   NEUTROABS 3.2 9.4*  --   --   HGB 9.0*  11.5* 12.6* 9.5*  HCT 27.1* 36.0* 37.0* 28.0*  MCV 86.3 92.1  --   --   PLT 211 365  --   --    Cardiac Enzymes:  Recent Labs Lab 09/14/17 0346 09/14/17 1257  TROPONINI <0.03 <0.03   BNP: Invalid input(s): POCBNP CBG:  Recent Labs Lab 09/15/17 0754 09/15/17 1213  GLUCAP 332* 198*    Significant Diagnostic Studies:  Dg Chest Port 1 View  Result Date: 09/14/2017 CLINICAL DATA:  Chest pain EXAM: PORTABLE CHEST 1 VIEW COMPARISON:  09/07/2017 FINDINGS: Heart and mediastinal contours are within normal limits. No focal opacities or effusions. No acute bony abnormality. IMPRESSION: No active disease. Electronically Signed   By: Charlett Nose M.D.   On: 09/14/2017 12:05    2D ECHO:   Disposition and Follow-up:    DISPOSITION: home    DISCHARGE FOLLOW-UP Follow-up Information    Fleet Contras, MD. Schedule an appointment as soon as possible for a visit in 2 week(s).   Specialty:  Internal Medicine Contact information: [address] [place] [address] [phone]            Time spent on Discharge:   Signed:   Thad Ranger M.D. [facility] 09/15/2017, 4:24 PM Pager:  [phone]

## 2017-09-15 NOTE — Consult Note (Signed)
Patient refused psychiatric consultation. He says : ''I don't want to talk to or see a psychiatric, thank you for coming goodbye.'' Patient's Nurse informed. Re-consult psych if necessary and when patient is ready to be interviewed. Thedore Mins, MD

## 2017-09-15 NOTE — Progress Notes (Signed)
Reather Laurence to be D/C'd home per MD order.  Discussed with the patient and all questions fully answered.  VSS, Skin clean, dry and intact without evidence of skin break down, no evidence of skin tears noted, except on pt's left heel area, open sore size of an orange, neosporin placed on wound with 4x4 and wrapped in kling.  IV catheter discontinued intact. Site without signs and symptoms of complications. Dressing and pressure applied.  An After Visit Summary was printed and given to the patient. Patient received prescription.  D/c education completed with patient/family including follow up instructions, medication list, d/c activities limitations if indicated, with other d/c instructions as indicated by MD - patient able to verbalize understanding, all questions fully answered.   Patient instructed to return to ED, call 911, or call MD for any changes in condition.   Patient escorted via WC, and D/C home via taxi.  Evern Bio 09/15/2017 4:56 PM

## 2017-09-15 NOTE — Progress Notes (Signed)
Pt became agitated with staff this AM after CBG checked and noted that he was 350. Pt requested a snack with coffee at that time, and was told "no, we need to concentrate on reducing your blood sugar"; Pt insisted, and staff reiterated the importance of maintaining the prescribed diet to better ensure glucose control. Pt spoke over staff, repeating over and over, "I don't wanna hear what you have to say, get out of my room". Pt then refused early morning medications and stated that he wants to leave now. On call MD Burnadette Peter) paged, no new orders received at this time.

## 2017-10-09 ENCOUNTER — Encounter (HOSPITAL_BASED_OUTPATIENT_CLINIC_OR_DEPARTMENT_OTHER): Payer: Medicare Other

## 2017-10-10 ENCOUNTER — Encounter (HOSPITAL_COMMUNITY): Payer: Self-pay

## 2017-10-10 ENCOUNTER — Inpatient Hospital Stay (HOSPITAL_COMMUNITY)
Admission: EM | Admit: 2017-10-10 | Discharge: 2017-10-13 | DRG: 637 | Disposition: A | Discharge status: Home / Self Care | Payer: Medicare Other | Attending: Family Medicine | Admitting: Family Medicine

## 2017-10-10 DIAGNOSIS — F17210 Nicotine dependence, cigarettes, uncomplicated: Secondary | ICD-10-CM | POA: Diagnosis present

## 2017-10-10 DIAGNOSIS — D649 Anemia, unspecified: Secondary | ICD-10-CM | POA: Diagnosis present

## 2017-10-10 DIAGNOSIS — R1310 Dysphagia, unspecified: Secondary | ICD-10-CM

## 2017-10-10 DIAGNOSIS — K210 Gastro-esophageal reflux disease with esophagitis: Secondary | ICD-10-CM | POA: Diagnosis present

## 2017-10-10 DIAGNOSIS — E10621 Type 1 diabetes mellitus with foot ulcer: Secondary | ICD-10-CM | POA: Diagnosis present

## 2017-10-10 DIAGNOSIS — Z882 Allergy status to sulfonamides status: Secondary | ICD-10-CM

## 2017-10-10 DIAGNOSIS — E1010 Type 1 diabetes mellitus with ketoacidosis without coma: Principal | ICD-10-CM

## 2017-10-10 DIAGNOSIS — L89623 Pressure ulcer of left heel, stage 3: Secondary | ICD-10-CM | POA: Diagnosis present

## 2017-10-10 DIAGNOSIS — I10 Essential (primary) hypertension: Secondary | ICD-10-CM | POA: Diagnosis present

## 2017-10-10 DIAGNOSIS — E876 Hypokalemia: Secondary | ICD-10-CM | POA: Diagnosis not present

## 2017-10-10 DIAGNOSIS — L97429 Non-pressure chronic ulcer of left heel and midfoot with unspecified severity: Secondary | ICD-10-CM | POA: Diagnosis present

## 2017-10-10 DIAGNOSIS — G8929 Other chronic pain: Secondary | ICD-10-CM | POA: Diagnosis present

## 2017-10-10 DIAGNOSIS — M25511 Pain in right shoulder: Secondary | ICD-10-CM | POA: Diagnosis present

## 2017-10-10 DIAGNOSIS — Z794 Long term (current) use of insulin: Secondary | ICD-10-CM

## 2017-10-10 DIAGNOSIS — Z885 Allergy status to narcotic agent status: Secondary | ICD-10-CM

## 2017-10-10 DIAGNOSIS — F909 Attention-deficit hyperactivity disorder, unspecified type: Secondary | ICD-10-CM | POA: Diagnosis present

## 2017-10-10 DIAGNOSIS — M419 Scoliosis, unspecified: Secondary | ICD-10-CM | POA: Diagnosis present

## 2017-10-10 DIAGNOSIS — F259 Schizoaffective disorder, unspecified: Secondary | ICD-10-CM | POA: Diagnosis present

## 2017-10-10 DIAGNOSIS — A419 Sepsis, unspecified organism: Secondary | ICD-10-CM

## 2017-10-10 DIAGNOSIS — E875 Hyperkalemia: Secondary | ICD-10-CM | POA: Diagnosis present

## 2017-10-10 DIAGNOSIS — Z9114 Patient's other noncompliance with medication regimen: Secondary | ICD-10-CM

## 2017-10-10 DIAGNOSIS — Z9119 Patient's noncompliance with other medical treatment and regimen: Secondary | ICD-10-CM

## 2017-10-10 DIAGNOSIS — G9341 Metabolic encephalopathy: Secondary | ICD-10-CM | POA: Diagnosis present

## 2017-10-10 DIAGNOSIS — L8941 Pressure ulcer of contiguous site of back, buttock and hip, stage 1: Secondary | ICD-10-CM | POA: Diagnosis present

## 2017-10-10 DIAGNOSIS — F203 Undifferentiated schizophrenia: Secondary | ICD-10-CM | POA: Diagnosis present

## 2017-10-10 DIAGNOSIS — N179 Acute kidney failure, unspecified: Secondary | ICD-10-CM | POA: Diagnosis present

## 2017-10-10 DIAGNOSIS — J189 Pneumonia, unspecified organism: Secondary | ICD-10-CM | POA: Diagnosis present

## 2017-10-10 DIAGNOSIS — G9340 Encephalopathy, unspecified: Secondary | ICD-10-CM | POA: Diagnosis present

## 2017-10-10 DIAGNOSIS — Z781 Physical restraint status: Secondary | ICD-10-CM

## 2017-10-10 DIAGNOSIS — Z833 Family history of diabetes mellitus: Secondary | ICD-10-CM

## 2017-10-10 DIAGNOSIS — I959 Hypotension, unspecified: Secondary | ICD-10-CM | POA: Diagnosis present

## 2017-10-10 DIAGNOSIS — E1110 Type 2 diabetes mellitus with ketoacidosis without coma: Secondary | ICD-10-CM

## 2017-10-10 LAB — CBC
HCT: 38.9 % — ABNORMAL LOW (ref 39.0–52.0)
Hemoglobin: 12.8 g/dL — ABNORMAL LOW (ref 13.0–17.0)
MCH: 29.8 pg (ref 26.0–34.0)
MCHC: 32.9 g/dL (ref 30.0–36.0)
MCV: 90.7 fL (ref 78.0–100.0)
PLATELETS: 422 10*3/uL — AB (ref 150–400)
RBC: 4.29 MIL/uL (ref 4.22–5.81)
RDW: 13.8 % (ref 11.5–15.5)
WBC: 28.6 10*3/uL — ABNORMAL HIGH (ref 4.0–10.5)

## 2017-10-10 LAB — CBG MONITORING, ED

## 2017-10-10 MED ORDER — SODIUM CHLORIDE 0.9 % IV SOLN
INTRAVENOUS | Status: DC
  Administered 2017-10-10: 5.4 [IU]/h via INTRAVENOUS
  Filled 2017-10-10: qty 1

## 2017-10-10 MED ORDER — SODIUM CHLORIDE 0.9 % IV BOLUS (SEPSIS)
1000.0000 mL | Freq: Once | INTRAVENOUS | Status: AC
  Administered 2017-10-10: 1000 mL via INTRAVENOUS

## 2017-10-10 MED ORDER — DEXTROSE-NACL 5-0.45 % IV SOLN: INTRAVENOUS | Status: DC

## 2017-10-10 MED ORDER — SODIUM CHLORIDE 0.9 % IV SOLN
INTRAVENOUS | Status: DC
  Administered 2017-10-11: via INTRAVENOUS

## 2017-10-10 NOTE — ED Triage Notes (Signed)
Pt comes via [facility] for DKA. Pt refuses to take his insulin for an unknown amount of time. Pt with kussmaul respirations, and hallucinations. CBG high on EMS monitor, pt also have diabetic ulcer on L foot

## 2017-10-10 NOTE — ED Provider Notes (Signed)
TIME SEEN: 11:09 PM  CHIEF COMPLAINT: Hyperglycemia  HPI: Patient is a 31 year old male with history of insulin-dependent diabetes, multiple episodes of DKA requiring multiple admissions, schizophrenia, medical noncompliance who presents to the emergency department with hypoglycemia and altered mental status.  EMS was called out by patient's roommate who found him confused.  Unsure of last known normal.  Patient unable to answer questions.  Blood sugar over 600.  ROS: Level 5 caveat for altered mental status  PAST MEDICAL HISTORY/PAST SURGICAL HISTORY:  Past Medical History:  Diagnosis Date  . Anemia   . Anxiety   . Arthritis    "both hips; both shoulders" (06/06/2017)  . Chicken pox   . Childhood asthma   . Chronic pain   . Depression   . DKA (diabetic ketoacidoses) ([name]) 11/18/2014  . DKA (diabetic ketoacidoses) ([name]) 07/05/2017  . GERD (gastroesophageal reflux disease)   . Hypertension   . Migraine    "a few/year" (06/06/2017)  . Noncompliance with medication regimen   . Pneumonia    "several times" (06/06/2017)  . Polysubstance abuse ([name])   . Schizo affective schizophrenia ([name])   . Scoliosis   . Type I diabetes mellitus ([name])     MEDICATIONS:  Prior to Admission medications   Medication Sig Start Date End Date Taking? Authorizing Provider  acetaminophen (TYLENOL) 500 MG tablet Take 1 tablet (500 mg total) by mouth every 6 (six) hours as needed. 09/10/17   Maxie Barb, MD  amphetamine-dextroamphetamine (ADDERALL) 30 MG tablet Take 30 mg by mouth daily.  06/26/17   [provider]  doxepin (SINEQUAN) 10 MG/ML solution Take 10 mLs by mouth at bedtime. 07/11/17   [provider]  insulin aspart (NOVOLOG FLEXPEN) 100 UNIT/ML FlexPen Inject 0-15 Units into the skin See admin instructions. Check Blood Sugar 4 times per day > with meals and at bedtime CBG 70 - 120: 0 units CBG 121 - 150: 2 units CBG 151 - 200: 3 units CBG 201 - 250: 5 units CBG 251 -  300: 8 units CBG 301 - 350: 11 units CBG 351 - 400: 15 units 09/15/17   Rai, Ripudeep K, MD  insulin detemir (LEVEMIR) 100 UNIT/ML injection Inject 0.3 mLs (30 Units total) into the skin daily. 09/15/17   Rai, Delene Ruffini, MD  metoCLOPramide (REGLAN) 10 MG tablet Take 1 tablet (10 mg total) by mouth 3 (three) times daily as needed for nausea or vomiting. 09/15/17   Rai, Delene Ruffini, MD  neomycin-bacitracin-polymyxin (NEOSPORIN) OINT Apply 1 application topically daily. Apply Neosporin to left heel daily, then cover with gauze and kerlex 09/16/17   Rai, Ripudeep K, MD  pantoprazole (PROTONIX) 40 MG tablet Take 1 tablet (40 mg total) by mouth daily. 09/15/17   Rai, Ripudeep K, MD  potassium chloride (K-DUR) 10 MEQ tablet Take 1 tablet (10 mEq total) by mouth daily. 09/10/17   Maxie Barb, MD  sennosides-docusate sodium (SENOKOT-S) 8.6-50 MG tablet Take 1 tablet by mouth as needed for constipation.    [provider]  traZODone (DESYREL) 150 MG tablet Take 150 mg by mouth at bedtime. 06/26/17   [provider]    ALLERGIES:  Allergies  Allergen Reactions  . Sulfa Antibiotics Other (See Comments)    Unknown childhood allergy  . Fentanyl Other (See Comments)    Patient had hallucinations with the medication    SOCIAL HISTORY:  Social History  Substance Use Topics  . Smoking status: Current Every Day Smoker  Packs/day: 0.50    Years: 9.00    Types: Cigarettes  . Smokeless tobacco: Never Used  . Alcohol use No    FAMILY HISTORY: Family History  Problem Relation Age of Onset  . Diabetes Mother     EXAM: BP 94/62   Pulse (!) 113   Resp 20  CONSTITUTIONAL: Alert and will moan and move all 4 extremities, can open his eyes to voice, unintelligible speech HEAD: Normocephalic EYES: Conjunctivae clear, pupils appear equal, EOMI ENT: normal nose; dry mucous membranes NECK: Supple, no meningismus, no nuchal rigidity, no LAD  CARD: Regular and tachycardic; S1 and S2  appreciated; no murmurs, no clicks, no rubs, no gallops RESP: Normal chest excursion without splinting, tachypnea, breath sounds clear and equal bilaterally; no wheezes, no rhonchi, no rales, no hypoxia or respiratory distress, speaking full sentences ABD/GI: Normal bowel sounds; non-distended; soft, non-tender, no rebound, no guarding, no peritoneal signs, no hepatosplenomegaly BACK:  The back appears normal and is non-tender to palpation, there is no CVA tenderness EXT: Normal ROM in all joints; non-tender to palpation; no edema; normal capillary refill; no cyanosis, no calf tenderness or swelling    SKIN: Normal color for age and race; warm; no rash NEURO: Moves all extremities equally, opens his eyes spontaneously, moves his extremities spontaneously, moans and has unintelligible speech, will follow some commands; GCS 12   MEDICAL DECISION MAKING: Patient here with tachycardia, hypotension, hypoglycemia.  He appears to be in DKA.  GCS is 12.  At this time he is protecting his airway.  Will give IV fluids and start IV insulin.  Will obtain labs, urine.  Patient will need admission.  ED PROGRESS: 12:20 AM  Patient has a pH of 6.8 with undetectable bicarb.  Will start bicarb drip.  He also has elevated lactate and a leukocytosis of 28,000.  This could be reactive but given he is not able to bright history for a sinus tachycardic and hypotensive, will start broad-spectrum antibiotics and give 30 mL/kg IV fluid bolus.  Discussed with Dr. Belia Heman with critical care.  They will see patient in the ED.   12:50 AM  2nd PIV placed.  CCM at bedside.  Appreciate critical care help.  They will admit.  I reviewed all nursing notes, vitals, pertinent previous records, EKGs, lab and urine results, imaging (as available).   EKG Interpretation  Date/Time:  Wednesday October 10 2017 23:03:41 EDT Ventricular Rate:  114 PR Interval:    QRS Duration: 97 QT Interval:  357 QTC Calculation: 492 R Axis:   -55 Text  Interpretation:  Sinus tachycardia LAD, consider left anterior fascicular block ST elev, probable normal early repol pattern Prolonged QT interval Peaked T waves present compared to previous Confirmed by Rochele Raring [phone]) on 10/10/2017 11:09:43 PM        Angiocath insertion Performed by: Raelyn Number  Consent: Verbal consent obtained. Risks and benefits: risks, benefits and alternatives were discussed Time out: Immediately prior to procedure a "time out" was called to verify the correct patient, procedure, equipment, support staff and site/side marked as required.  Preparation: Patient was prepped and draped in the usual sterile fashion.  Vein Location: left AC  Ultrasound Guided  Gauge: 22  Normal blood return and flush without difficulty Patient tolerance: Patient tolerated the procedure well with no immediate complications.     CRITICAL CARE Performed by: Raelyn Number   Total critical care time: 55 minutes  Critical care time was exclusive of separately billable procedures  and treating other patients.  Critical care was necessary to treat or prevent imminent or life-threatening deterioration.  Critical care was time spent personally by me on the following activities: development of treatment plan with patient and/or surrogate as well as nursing, discussions with consultants, evaluation of patient's response to treatment, examination of patient, obtaining history from patient or surrogate, ordering and performing treatments and interventions, ordering and review of laboratory studies, ordering and review of radiographic studies, pulse oximetry and re-evaluation of patient's condition.     Miken Stecher, Layla Maw, DO 10/11/17 [phone]

## 2017-10-11 ENCOUNTER — Inpatient Hospital Stay (HOSPITAL_COMMUNITY): Payer: Medicare Other

## 2017-10-11 DIAGNOSIS — Z794 Long term (current) use of insulin: Secondary | ICD-10-CM | POA: Diagnosis not present

## 2017-10-11 DIAGNOSIS — L89623 Pressure ulcer of left heel, stage 3: Secondary | ICD-10-CM | POA: Diagnosis present

## 2017-10-11 DIAGNOSIS — Z9119 Patient's noncompliance with other medical treatment and regimen: Secondary | ICD-10-CM | POA: Diagnosis not present

## 2017-10-11 DIAGNOSIS — A419 Sepsis, unspecified organism: Secondary | ICD-10-CM | POA: Diagnosis present

## 2017-10-11 DIAGNOSIS — Z885 Allergy status to narcotic agent status: Secondary | ICD-10-CM | POA: Diagnosis not present

## 2017-10-11 DIAGNOSIS — L8990 Pressure ulcer of unspecified site, unspecified stage: Secondary | ICD-10-CM | POA: Diagnosis not present

## 2017-10-11 DIAGNOSIS — F203 Undifferentiated schizophrenia: Secondary | ICD-10-CM | POA: Diagnosis not present

## 2017-10-11 DIAGNOSIS — E875 Hyperkalemia: Secondary | ICD-10-CM | POA: Diagnosis present

## 2017-10-11 DIAGNOSIS — Z781 Physical restraint status: Secondary | ICD-10-CM | POA: Diagnosis not present

## 2017-10-11 DIAGNOSIS — E1010 Type 1 diabetes mellitus with ketoacidosis without coma: Principal | ICD-10-CM

## 2017-10-11 DIAGNOSIS — E10621 Type 1 diabetes mellitus with foot ulcer: Secondary | ICD-10-CM | POA: Diagnosis present

## 2017-10-11 DIAGNOSIS — F1910 Other psychoactive substance abuse, uncomplicated: Secondary | ICD-10-CM | POA: Diagnosis not present

## 2017-10-11 DIAGNOSIS — L8941 Pressure ulcer of contiguous site of back, buttock and hip, stage 1: Secondary | ICD-10-CM | POA: Diagnosis present

## 2017-10-11 DIAGNOSIS — Z882 Allergy status to sulfonamides status: Secondary | ICD-10-CM | POA: Diagnosis not present

## 2017-10-11 DIAGNOSIS — I10 Essential (primary) hypertension: Secondary | ICD-10-CM | POA: Diagnosis present

## 2017-10-11 DIAGNOSIS — Z833 Family history of diabetes mellitus: Secondary | ICD-10-CM | POA: Diagnosis not present

## 2017-10-11 DIAGNOSIS — I959 Hypotension, unspecified: Secondary | ICD-10-CM | POA: Diagnosis present

## 2017-10-11 DIAGNOSIS — M419 Scoliosis, unspecified: Secondary | ICD-10-CM | POA: Diagnosis present

## 2017-10-11 DIAGNOSIS — F259 Schizoaffective disorder, unspecified: Secondary | ICD-10-CM | POA: Diagnosis present

## 2017-10-11 DIAGNOSIS — N179 Acute kidney failure, unspecified: Secondary | ICD-10-CM

## 2017-10-11 DIAGNOSIS — G8929 Other chronic pain: Secondary | ICD-10-CM | POA: Diagnosis present

## 2017-10-11 DIAGNOSIS — K209 Esophagitis, unspecified: Secondary | ICD-10-CM | POA: Diagnosis present

## 2017-10-11 DIAGNOSIS — F17210 Nicotine dependence, cigarettes, uncomplicated: Secondary | ICD-10-CM | POA: Diagnosis present

## 2017-10-11 DIAGNOSIS — J189 Pneumonia, unspecified organism: Secondary | ICD-10-CM | POA: Diagnosis present

## 2017-10-11 DIAGNOSIS — L97429 Non-pressure chronic ulcer of left heel and midfoot with unspecified severity: Secondary | ICD-10-CM | POA: Diagnosis present

## 2017-10-11 DIAGNOSIS — G9340 Encephalopathy, unspecified: Secondary | ICD-10-CM | POA: Diagnosis present

## 2017-10-11 DIAGNOSIS — L89603 Pressure ulcer of unspecified heel, stage 3: Secondary | ICD-10-CM | POA: Diagnosis not present

## 2017-10-11 DIAGNOSIS — G9341 Metabolic encephalopathy: Secondary | ICD-10-CM | POA: Diagnosis present

## 2017-10-11 DIAGNOSIS — K219 Gastro-esophageal reflux disease without esophagitis: Secondary | ICD-10-CM | POA: Diagnosis present

## 2017-10-11 DIAGNOSIS — R1310 Dysphagia, unspecified: Secondary | ICD-10-CM | POA: Diagnosis not present

## 2017-10-11 DIAGNOSIS — Z9114 Patient's other noncompliance with medication regimen: Secondary | ICD-10-CM | POA: Diagnosis not present

## 2017-10-11 LAB — CBC WITH DIFFERENTIAL/PLATELET
Basophils Absolute: 0 10*3/uL (ref 0.0–0.1)
Basophils Relative: 0 %
EOS ABS: 0 10*3/uL (ref 0.0–0.7)
Eosinophils Relative: 0 %
HEMATOCRIT: 26.7 % — AB (ref 39.0–52.0)
HEMOGLOBIN: 9.2 g/dL — AB (ref 13.0–17.0)
LYMPHS ABS: 1.2 10*3/uL (ref 0.7–4.0)
Lymphocytes Relative: 7 %
MCH: 29 pg (ref 26.0–34.0)
MCHC: 34.5 g/dL (ref 30.0–36.0)
MCV: 84.2 fL (ref 78.0–100.0)
Monocytes Absolute: 1.2 10*3/uL — ABNORMAL HIGH (ref 0.1–1.0)
Monocytes Relative: 7 %
NEUTROS ABS: 15.4 10*3/uL — AB (ref 1.7–7.7)
Neutrophils Relative %: 86 %
Platelets: 294 10*3/uL (ref 150–400)
RBC: 3.17 MIL/uL — AB (ref 4.22–5.81)
RDW: 13.4 % (ref 11.5–15.5)
WBC: 17.9 10*3/uL — AB (ref 4.0–10.5)

## 2017-10-11 LAB — BASIC METABOLIC PANEL
ANION GAP: 10 (ref 5–15)
ANION GAP: 7 (ref 5–15)
Anion gap: 22 — ABNORMAL HIGH (ref 5–15)
Anion gap: 13 (ref 5–15)
Anion gap: 7 (ref 5–15)
Anion gap: 14 (ref 5–15)
BUN: 25 mg/dL — ABNORMAL HIGH (ref 6–20)
BUN: 25 mg/dL — AB (ref 6–20)
BUN: 21 mg/dL — AB (ref 6–20)
BUN: 17 mg/dL (ref 6–20)
BUN: 14 mg/dL (ref 6–20)
BUN: 13 mg/dL (ref 6–20)
BUN: 12 mg/dL (ref 6–20)
BUN: 10 mg/dL (ref 6–20)
CALCIUM: 8 mg/dL — AB (ref 8.9–10.3)
CALCIUM: 7.8 mg/dL — AB (ref 8.9–10.3)
CHLORIDE: 92 mmol/L — AB (ref 101–111)
CHLORIDE: 112 mmol/L — AB (ref 101–111)
CHLORIDE: 114 mmol/L — AB (ref 101–111)
CHLORIDE: 115 mmol/L — AB (ref 101–111)
CHLORIDE: 113 mmol/L — AB (ref 101–111)
CO2: <7 mmol/L — ABNORMAL LOW (ref 22–32)
CO2: <7 mmol/L — ABNORMAL LOW (ref 22–32)
CO2: 7 mmol/L — ABNORMAL LOW (ref 22–32)
CO2: 14 mmol/L — ABNORMAL LOW (ref 22–32)
CO2: 17 mmol/L — AB (ref 22–32)
CO2: 12 mmol/L — AB (ref 22–32)
CO2: 15 mmol/L — AB (ref 22–32)
CO2: 18 mmol/L — AB (ref 22–32)
CREATININE: 2.38 mg/dL — AB (ref 0.61–1.24)
CREATININE: 1.78 mg/dL — AB (ref 0.61–1.24)
CREATININE: 1.39 mg/dL — AB (ref 0.61–1.24)
CREATININE: 1.22 mg/dL (ref 0.61–1.24)
CREATININE: 1.13 mg/dL (ref 0.61–1.24)
Calcium: 8.9 mg/dL (ref 8.9–10.3)
Calcium: 8.9 mg/dL (ref 8.9–10.3)
Calcium: 6.6 mg/dL — ABNORMAL LOW (ref 8.9–10.3)
Calcium: 7.5 mg/dL — ABNORMAL LOW (ref 8.9–10.3)
Calcium: 7.7 mg/dL — ABNORMAL LOW (ref 8.9–10.3)
Calcium: 7.9 mg/dL — ABNORMAL LOW (ref 8.9–10.3)
Chloride: 96 mmol/L — ABNORMAL LOW (ref 101–111)
Chloride: 114 mmol/L — ABNORMAL HIGH (ref 101–111)
Chloride: 114 mmol/L — ABNORMAL HIGH (ref 101–111)
Creatinine, Ser: 2.2 mg/dL — ABNORMAL HIGH (ref 0.61–1.24)
Creatinine, Ser: 1.34 mg/dL — ABNORMAL HIGH (ref 0.61–1.24)
Creatinine, Ser: 1.09 mg/dL (ref 0.61–1.24)
GFR calc Af Amer: 44 mL/min — ABNORMAL LOW (ref >60)
GFR calc Af Amer: 40 mL/min — ABNORMAL LOW (ref >60)
GFR calc Af Amer: 57 mL/min — ABNORMAL LOW (ref >60)
GFR calc Af Amer: >60 mL/min (ref >60)
GFR calc Af Amer: >60 mL/min (ref >60)
GFR calc Af Amer: >60 mL/min (ref >60)
GFR calc Af Amer: >60 mL/min (ref >60)
GFR calc Af Amer: >60 mL/min (ref >60)
GFR calc non Af Amer: 38 mL/min — ABNORMAL LOW (ref >60)
GFR calc non Af Amer: 49 mL/min — ABNORMAL LOW (ref >60)
GFR calc non Af Amer: >60 mL/min (ref >60)
GFR calc non Af Amer: >60 mL/min (ref >60)
GFR calc non Af Amer: >60 mL/min (ref >60)
GFR calc non Af Amer: >60 mL/min (ref >60)
GFR, EST NON AFRICAN AMERICAN: 34 mL/min — AB (ref >60)
GLUCOSE: 102 mg/dL — AB (ref 65–99)
GLUCOSE: 207 mg/dL — AB (ref 65–99)
GLUCOSE: 244 mg/dL — AB (ref 65–99)
GLUCOSE: 190 mg/dL — AB (ref 65–99)
Glucose, Bld: 788 mg/dL (ref 65–99)
Glucose, Bld: 819 mg/dL (ref 65–99)
Glucose, Bld: 426 mg/dL — ABNORMAL HIGH (ref 65–99)
Glucose, Bld: 123 mg/dL — ABNORMAL HIGH (ref 65–99)
Potassium: 5.5 mmol/L — ABNORMAL HIGH (ref 3.5–5.1)
Potassium: 5.3 mmol/L — ABNORMAL HIGH (ref 3.5–5.1)
Potassium: 3.7 mmol/L (ref 3.5–5.1)
Potassium: 3.1 mmol/L — ABNORMAL LOW (ref 3.5–5.1)
Potassium: 3.5 mmol/L (ref 3.5–5.1)
Potassium: 3.7 mmol/L (ref 3.5–5.1)
Potassium: 3.2 mmol/L — ABNORMAL LOW (ref 3.5–5.1)
Potassium: 3.7 mmol/L (ref 3.5–5.1)
SODIUM: 141 mmol/L (ref 135–145)
SODIUM: 141 mmol/L (ref 135–145)
SODIUM: 139 mmol/L (ref 135–145)
SODIUM: 139 mmol/L (ref 135–145)
Sodium: 132 mmol/L — ABNORMAL LOW (ref 135–145)
Sodium: 132 mmol/L — ABNORMAL LOW (ref 135–145)
Sodium: 139 mmol/L (ref 135–145)
Sodium: 139 mmol/L (ref 135–145)

## 2017-10-11 LAB — LACTIC ACID, PLASMA
LACTIC ACID, VENOUS: 2.6 mmol/L — AB (ref 0.5–1.9)
Lactic Acid, Venous: 1.4 mmol/L (ref 0.5–1.9)

## 2017-10-11 LAB — CBC
HEMATOCRIT: 26.9 % — AB (ref 39.0–52.0)
Hemoglobin: 9.2 g/dL — ABNORMAL LOW (ref 13.0–17.0)
MCH: 29 pg (ref 26.0–34.0)
MCHC: 34.2 g/dL (ref 30.0–36.0)
MCV: 84.9 fL (ref 78.0–100.0)
PLATELETS: 302 10*3/uL (ref 150–400)
RBC: 3.17 MIL/uL — ABNORMAL LOW (ref 4.22–5.81)
RDW: 13.3 % (ref 11.5–15.5)
WBC: 18.6 10*3/uL — AB (ref 4.0–10.5)

## 2017-10-11 LAB — URINALYSIS, ROUTINE W REFLEX MICROSCOPIC
BACTERIA UA: NONE SEEN
Bilirubin Urine: NEGATIVE
Glucose, UA: >=500 mg/dL — AB
Ketones, ur: 80 mg/dL — AB
Leukocytes, UA: NEGATIVE
Nitrite: NEGATIVE
PROTEIN: NEGATIVE mg/dL
SPECIFIC GRAVITY, URINE: 1.017 (ref 1.005–1.030)
pH: 5 (ref 5.0–8.0)

## 2017-10-11 LAB — GLUCOSE, CAPILLARY
GLUCOSE-CAPILLARY: 392 mg/dL — AB (ref 65–99)
GLUCOSE-CAPILLARY: 348 mg/dL — AB (ref 65–99)
GLUCOSE-CAPILLARY: 116 mg/dL — AB (ref 65–99)
GLUCOSE-CAPILLARY: 107 mg/dL — AB (ref 65–99)
GLUCOSE-CAPILLARY: 120 mg/dL — AB (ref 65–99)
GLUCOSE-CAPILLARY: 155 mg/dL — AB (ref 65–99)
GLUCOSE-CAPILLARY: 252 mg/dL — AB (ref 65–99)
GLUCOSE-CAPILLARY: 271 mg/dL — AB (ref 65–99)
GLUCOSE-CAPILLARY: 167 mg/dL — AB (ref 65–99)
GLUCOSE-CAPILLARY: 148 mg/dL — AB (ref 65–99)
GLUCOSE-CAPILLARY: 156 mg/dL — AB (ref 65–99)
GLUCOSE-CAPILLARY: 169 mg/dL — AB (ref 65–99)
Glucose-Capillary: 236 mg/dL — ABNORMAL HIGH (ref 65–99)
Glucose-Capillary: 163 mg/dL — ABNORMAL HIGH (ref 65–99)
Glucose-Capillary: 103 mg/dL — ABNORMAL HIGH (ref 65–99)
Glucose-Capillary: 86 mg/dL (ref 65–99)
Glucose-Capillary: 103 mg/dL — ABNORMAL HIGH (ref 65–99)
Glucose-Capillary: 107 mg/dL — ABNORMAL HIGH (ref 65–99)
Glucose-Capillary: 214 mg/dL — ABNORMAL HIGH (ref 65–99)
Glucose-Capillary: 226 mg/dL — ABNORMAL HIGH (ref 65–99)

## 2017-10-11 LAB — I-STAT CHEM 8, ED
BUN: 29 mg/dL — ABNORMAL HIGH (ref 6–20)
Calcium, Ion: 1.15 mmol/L (ref 1.15–1.40)
Chloride: 103 mmol/L (ref 101–111)
Creatinine, Ser: 1.2 mg/dL (ref 0.61–1.24)
Glucose, Bld: >700 mg/dL (ref 65–99)
HCT: 43 % (ref 39.0–52.0)
Hemoglobin: 14.6 g/dL (ref 13.0–17.0)
Potassium: 5.3 mmol/L — ABNORMAL HIGH (ref 3.5–5.1)
Sodium: 131 mmol/L — ABNORMAL LOW (ref 135–145)
TCO2: 7 mmol/L — ABNORMAL LOW (ref 22–32)

## 2017-10-11 LAB — BLOOD GAS, ARTERIAL
Acid-base deficit: 22.9 mmol/L — ABNORMAL HIGH (ref 0.0–2.0)
Bicarbonate: 4.7 mmol/L — ABNORMAL LOW (ref 20.0–28.0)
Drawn by: 51133
FIO2: 21
O2 Saturation: 98.6 %
Patient temperature: 98.6
pH, Arterial: 7.157 — CL (ref 7.350–7.450)
pO2, Arterial: 130 mmHg — ABNORMAL HIGH (ref 83.0–108.0)

## 2017-10-11 LAB — RAPID URINE DRUG SCREEN, HOSP PERFORMED
Amphetamines: POSITIVE — AB
Barbiturates: NOT DETECTED
Benzodiazepines: NOT DETECTED
Cocaine: NOT DETECTED
OPIATES: NOT DETECTED
Tetrahydrocannabinol: NOT DETECTED

## 2017-10-11 LAB — MRSA PCR SCREENING: MRSA by PCR: NEGATIVE

## 2017-10-11 LAB — PHOSPHORUS: Phosphorus: 9.2 mg/dL — ABNORMAL HIGH (ref 2.5–4.6)

## 2017-10-11 LAB — I-STAT VENOUS BLOOD GAS, ED
Acid-base deficit: 28 mmol/L — ABNORMAL HIGH (ref 0.0–2.0)
Bicarbonate: 4 mmol/L — ABNORMAL LOW (ref 20.0–28.0)
O2 SAT: 45 %
PCO2 VEN: 20.7 mmHg — AB (ref 44.0–60.0)
PO2 VEN: 41 mmHg (ref 32.0–45.0)
pH, Ven: 6.897 — CL (ref 7.250–7.430)

## 2017-10-11 LAB — BETA-HYDROXYBUTYRIC ACID: Beta-Hydroxybutyric Acid: >8 mmol/L — ABNORMAL HIGH (ref 0.05–0.27)

## 2017-10-11 LAB — ETHANOL: Alcohol, Ethyl (B): <10 mg/dL (ref <10)

## 2017-10-11 LAB — CBG MONITORING, ED
Glucose-Capillary: >600 mg/dL (ref 65–99)
Glucose-Capillary: >600 mg/dL (ref 65–99)

## 2017-10-11 LAB — MAGNESIUM: Magnesium: 2.5 mg/dL — ABNORMAL HIGH (ref 1.7–2.4)

## 2017-10-11 LAB — I-STAT CG4 LACTIC ACID, ED: LACTIC ACID, VENOUS: 3.43 mmol/L — AB (ref 0.5–1.9)

## 2017-10-11 LAB — HEMOGLOBIN A1C
HEMOGLOBIN A1C: 7.8 % — AB (ref 4.8–5.6)
MEAN PLASMA GLUCOSE: 177.16 mg/dL

## 2017-10-11 LAB — PROCALCITONIN: Procalcitonin: 0.18 ng/mL

## 2017-10-11 MED ORDER — DEXTROSE-NACL 5-0.45 % IV SOLN
INTRAVENOUS | Status: DC
  Administered 2017-10-11: 125 mL/h via INTRAVENOUS
  Administered 2017-10-11 – 2017-10-12 (×2): via INTRAVENOUS

## 2017-10-11 MED ORDER — VANCOMYCIN HCL IN DEXTROSE 1-5 GM/200ML-% IV SOLN
1000.0000 mg | Freq: Once | INTRAVENOUS | Status: AC
  Administered 2017-10-11: 1000 mg via INTRAVENOUS
  Filled 2017-10-11: qty 200

## 2017-10-11 MED ORDER — PANTOPRAZOLE SODIUM 40 MG IV SOLR
40.0000 mg | INTRAVENOUS | Status: DC
  Administered 2017-10-12 – 2017-10-13 (×2): 40 mg via INTRAVENOUS
  Filled 2017-10-11 (×2): qty 40

## 2017-10-11 MED ORDER — VANCOMYCIN HCL IN DEXTROSE 750-5 MG/150ML-% IV SOLN
750.0000 mg | Freq: Two times a day (BID) | INTRAVENOUS | Status: DC
  Administered 2017-10-11 – 2017-10-12 (×4): 750 mg via INTRAVENOUS
  Filled 2017-10-11 (×5): qty 150

## 2017-10-11 MED ORDER — MUPIROCIN CALCIUM 2 % EX CREA
TOPICAL_CREAM | Freq: Every day | CUTANEOUS | Status: DC
  Administered 2017-10-11 – 2017-10-12 (×2): via TOPICAL
  Filled 2017-10-11: qty 15

## 2017-10-11 MED ORDER — SODIUM CHLORIDE 0.9 % IV SOLN
INTRAVENOUS | Status: AC
  Administered 2017-10-11: 0.5 [IU]/h via INTRAVENOUS
  Filled 2017-10-11 (×2): qty 1

## 2017-10-11 MED ORDER — SODIUM CHLORIDE 0.9 % IV BOLUS (SEPSIS)
1000.0000 mL | Freq: Once | INTRAVENOUS | Status: AC
  Administered 2017-10-11: 1000 mL via INTRAVENOUS

## 2017-10-11 MED ORDER — SODIUM CHLORIDE 0.9 % IV BOLUS (SEPSIS)
500.0000 mL | Freq: Once | INTRAVENOUS | Status: AC
  Administered 2017-10-11: 500 mL via INTRAVENOUS

## 2017-10-11 MED ORDER — SODIUM CHLORIDE 0.9 % IV SOLN: INTRAVENOUS | Status: DC

## 2017-10-11 MED ORDER — HEPARIN SODIUM (PORCINE) 5000 UNIT/ML IJ SOLN
5000.0000 [IU] | Freq: Three times a day (TID) | INTRAMUSCULAR | Status: DC
  Administered 2017-10-11 – 2017-10-13 (×6): 5000 [IU] via SUBCUTANEOUS
  Filled 2017-10-11 (×7): qty 1

## 2017-10-11 MED ORDER — SODIUM BICARBONATE 8.4 % IV SOLN
INTRAVENOUS | Status: DC
  Administered 2017-10-11: 01:00:00 via INTRAVENOUS
  Filled 2017-10-11 (×2): qty 150

## 2017-10-11 MED ORDER — POTASSIUM CHLORIDE 10 MEQ/50ML IV SOLN
10.0000 meq | INTRAVENOUS | Status: AC
  Administered 2017-10-11 (×2): 10 meq via INTRAVENOUS

## 2017-10-11 MED ORDER — ORAL CARE MOUTH RINSE
15.0000 mL | Freq: Two times a day (BID) | OROMUCOSAL | Status: DC
  Administered 2017-10-11 – 2017-10-12 (×2): 15 mL via OROMUCOSAL

## 2017-10-11 MED ORDER — HYDROMORPHONE HCL 1 MG/ML IJ SOLN
1.0000 mg | Freq: Once | INTRAMUSCULAR | Status: AC
  Administered 2017-10-11: 1 mg via INTRAVENOUS
  Filled 2017-10-11: qty 1

## 2017-10-11 MED ORDER — RISPERIDONE 0.5 MG PO TABS
0.5000 mg | ORAL_TABLET | Freq: Two times a day (BID) | ORAL | Status: DC
  Administered 2017-10-11 (×2): 0.5 mg via ORAL
  Filled 2017-10-11 (×6): qty 1

## 2017-10-11 MED ORDER — POTASSIUM CHLORIDE 10 MEQ/50ML IV SOLN
10.0000 meq | INTRAVENOUS | Status: AC
  Administered 2017-10-11 (×4): 10 meq via INTRAVENOUS
  Filled 2017-10-11 (×6): qty 50

## 2017-10-11 MED ORDER — POTASSIUM CHLORIDE 10 MEQ/50ML IV SOLN
10.0000 meq | INTRAVENOUS | Status: AC
  Administered 2017-10-11 – 2017-10-12 (×6): 10 meq via INTRAVENOUS
  Filled 2017-10-11 (×6): qty 50

## 2017-10-11 MED ORDER — PIPERACILLIN-TAZOBACTAM 3.375 G IVPB
3.3750 g | Freq: Three times a day (TID) | INTRAVENOUS | Status: DC
  Administered 2017-10-11 – 2017-10-13 (×7): 3.375 g via INTRAVENOUS
  Filled 2017-10-11 (×9): qty 50

## 2017-10-11 MED ORDER — PIPERACILLIN-TAZOBACTAM 3.375 G IVPB 30 MIN
3.3750 g | Freq: Once | INTRAVENOUS | Status: AC
Start: 2017-10-11 — End: 2017-10-11
  Administered 2017-10-11: 3.375 g via INTRAVENOUS
  Filled 2017-10-11: qty 50

## 2017-10-11 NOTE — ED Notes (Signed)
Pt placed in soft restraints. 

## 2017-10-11 NOTE — Progress Notes (Signed)
Pharmacy Antibiotic Note  [name] is a 31 y.o. male admitted on 10/10/2017 with sepsis.  Pharmacy has been consulted for Vancocin and Zosyn dosing.  Plan: Vancomycin 1000mg  given in ED then 750mg  IV every 12 hours.  Goal trough 15-20 mcg/mL. Zosyn 3.375g IV q8h (4 hour infusion).  Height: 6\' 1"  (185.4 cm) Weight: 173 lb 1 oz (78.5 kg) IBW/kg (Calculated) : 79.9   Recent Labs Lab 10/10/17 2324 10/11/17 0010 10/11/17 0100  WBC 28.6*  --   --   CREATININE 2.20* 1.20 2.38*  LATICACIDVEN  --  3.43*  --     Estimated Creatinine Clearance: 49.5 mL/min (A) (by C-G formula based on SCr of 2.38 mg/dL (H)).    Allergies  Allergen Reactions  . Sulfa Antibiotics Other (See Comments)    Unknown childhood allergy  . Fentanyl Other (See Comments)    Patient had hallucinations with the medication    Thank you for allowing pharmacy to be a part of this patient's care.  10/12/17, [name], BCPS  10/11/2017 2:30 AM

## 2017-10-11 NOTE — Procedures (Signed)
Central Venous Catheter Insertion Procedure Note [name] [id number] 04-29-1986  Procedure: Insertion of Central Venous Catheter Indications: Assessment of intravascular volume, Drug and/or fluid administration and Frequent blood sampling  Procedure Details Consent: Unable to obtain consent because of emergent medical necessity. Time Out: Verified patient identification, verified procedure, site/side was marked, verified correct patient position, special equipment/implants available, medications/allergies/relevent history reviewed, required imaging and test results available.  Performed  Maximum sterile technique was used including antiseptics, cap, gloves, gown, hand hygiene, mask and sheet. Skin prep: Chlorhexidine; local anesthetic administered A antimicrobial bonded/coated triple lumen catheter was placed in the left internal jugular vein using the Seldinger technique.  Evaluation Blood flow good Complications: No apparent complications Patient did tolerate procedure well. Chest X-ray ordered to verify placement.  CXR: pending.  Jovita Kussmaul, AGACNP-BC [facility]  Pgr: [phone]  PCCM Pgr: [phone]

## 2017-10-11 NOTE — H&P (Signed)
PULMONARY / CRITICAL CARE MEDICINE   Name: [name] MRN: [id number] DOB: 01-14-1986    ADMISSION DATE:  10/10/2017 CONSULTATION DATE:  10/11/2017  REFERRING MD:  Dr. Elesa Massed   CHIEF COMPLAINT:  DKA   HISTORY OF PRESENT ILLNESS:   31 year old male with 1DM with multiple DKA admissions due to medical non-compliance, and schizophrenia   Presents to ED on 10/24 with AMS and hyperglycemia. Patient resides with a roommate whom found patient and called EMS. Upon arrival to ED with Kussmaul breathing, Glucose 788, PH 6.897, and WBC 28.6, LA 3.43. PCCM asked to admit.     PAST MEDICAL HISTORY :  He  has a past medical history of Anemia; Anxiety; Arthritis; Chicken pox; Childhood asthma; Chronic pain; Depression; DKA (diabetic ketoacidoses) ([name]) (11/18/2014); DKA (diabetic ketoacidoses) ([name]) (07/05/2017); GERD (gastroesophageal reflux disease); Hypertension; Migraine; Noncompliance with medication regimen; Pneumonia; Polysubstance abuse ([name]); Schizo affective schizophrenia ([name]); Scoliosis; and Type I diabetes mellitus ([name]).  PAST SURGICAL HISTORY: He  has a past surgical history that includes Incision and drainage abscess (Left, 11/2011) and Cardiac catheterization (11/2016).  Allergies  Allergen Reactions  . Sulfa Antibiotics Other (See Comments)    Unknown childhood allergy  . Fentanyl Other (See Comments)    Patient had hallucinations with the medication    No current facility-administered medications on file prior to encounter.    Current Outpatient Prescriptions on File Prior to Encounter  Medication Sig  . acetaminophen (TYLENOL) 500 MG tablet Take 1 tablet (500 mg total) by mouth every 6 (six) hours as needed.  [name] amphetamine-dextroamphetamine (ADDERALL) 30 MG tablet Take 30 mg by mouth daily.   [name] doxepin (SINEQUAN) 10 MG/ML solution Take 10 mLs by mouth at bedtime.  . insulin aspart (NOVOLOG FLEXPEN) 100 UNIT/ML FlexPen Inject 0-15 Units into the skin See admin  instructions. Check Blood Sugar 4 times per day > with meals and at bedtime CBG 70 - 120: 0 units CBG 121 - 150: 2 units CBG 151 - 200: 3 units CBG 201 - 250: 5 units CBG 251 - 300: 8 units CBG 301 - 350: 11 units CBG 351 - 400: 15 units  . insulin detemir (LEVEMIR) 100 UNIT/ML injection Inject 0.3 mLs (30 Units total) into the skin daily.  . metoCLOPramide (REGLAN) 10 MG tablet Take 1 tablet (10 mg total) by mouth 3 (three) times daily as needed for nausea or vomiting.  . neomycin-bacitracin-polymyxin (NEOSPORIN) OINT Apply 1 application topically daily. Apply Neosporin to left heel daily, then cover with gauze and kerlex  . pantoprazole (PROTONIX) 40 MG tablet Take 1 tablet (40 mg total) by mouth daily.  . potassium chloride (K-DUR) 10 MEQ tablet Take 1 tablet (10 mEq total) by mouth daily.  . sennosides-docusate sodium (SENOKOT-S) 8.6-50 MG tablet Take 1 tablet by mouth as needed for constipation.  . traZODone (DESYREL) 150 MG tablet Take 150 mg by mouth at bedtime.  . [DISCONTINUED] citalopram (CELEXA) 20 MG tablet Take 20 mg by mouth daily.  . [DISCONTINUED] FLUoxetine (PROZAC) 20 MG tablet Take 20 mg by mouth every morning.  . [DISCONTINUED] insulin NPH-regular Human (NOVOLIN 70/30) (70-30) 100 UNIT/ML injection Inject 40 Units into the skin 2 (two) times daily with a meal.  . [DISCONTINUED] OLANZapine zydis (ZYPREXA) 10 MG disintegrating tablet Take 10 mg by mouth 2 (two) times daily.  . [DISCONTINUED] sertraline (ZOLOFT) 50 MG tablet Take 50 mg by mouth daily. For depression. Just started med, have not picked up yet [facility] [address]  [address]    FAMILY HISTORY:  His indicated that his mother is alive.    SOCIAL HISTORY: He  reports that he has been smoking Cigarettes.  He has a 4.50 pack-year smoking history. He has never used smokeless tobacco. He reports that he uses drugs, including Marijuana, Cocaine, Methamphetamines, and "Crack" cocaine. He reports that he does not drink  alcohol.  REVIEW OF SYSTEMS:   Unable to review as patient is encephalopathic   SUBJECTIVE:   VITAL SIGNS: BP (!) 101/55   Pulse (!) 114   Resp 18   SpO2 100%   HEMODYNAMICS:    VENTILATOR SETTINGS:    INTAKE / OUTPUT: No intake/output data recorded.  PHYSICAL EXAMINATION: General:  Adult male, with kussmaul breathing  Neuro:  Lethargic, does not follow commands, moves extremities  HEENT:  Dry MM  Cardiovascular:  Tachy, no MRG  Lungs:  Clear breath sounds, no wheeze/crackles  Abdomen:  Bladder distended, active bowel sounds  Musculoskeletal:  -edema  Skin:  Dry, left heal diabetic foot ulcer   LABS:  BMET  Recent Labs Lab 10/10/17 2324 10/11/17 0010 10/11/17 0100  NA 132* 131* 132*  K 5.5* 5.3* 5.3*  CL 96* 103 92*  CO2 <7*  --  <7*  BUN 25* 29* 25*  CREATININE 2.20* 1.20 2.38*  GLUCOSE 788* >700* 819*    Electrolytes  Recent Labs Lab 10/10/17 2324 10/11/17 0100  CALCIUM 8.9 8.9  MG  --  2.5*  PHOS  --  9.2*    CBC  Recent Labs Lab 10/10/17 2324 10/11/17 0010  WBC 28.6*  --   HGB 12.8* 14.6  HCT 38.9* 43.0  PLT 422*  --     Coag's No results for input(s): APTT, INR in the last 168 hours.  Sepsis Markers  Recent Labs Lab 10/11/17 0010 10/11/17 0100  LATICACIDVEN 3.43*  --   PROCALCITON  --  0.18    ABG No results for input(s): PHART, PCO2ART, PO2ART in the last 168 hours.  Liver Enzymes No results for input(s): AST, ALT, ALKPHOS, BILITOT, ALBUMIN in the last 168 hours.  Cardiac Enzymes No results for input(s): TROPONINI, PROBNP in the last 168 hours.  Glucose  Recent Labs Lab 10/10/17 2303 10/11/17 0057 10/11/17 0201  GLUCAP >600* >600* >600*    Imaging Dg Chest Port 1 View  Result Date: 10/11/2017 CLINICAL DATA:  31 year old male with sepsis. EXAM: PORTABLE CHEST 1 VIEW COMPARISON:  Chest radiograph dated 09/14/2017 FINDINGS: The heart size and mediastinal contours are within normal limits. Both lungs are  clear. The visualized skeletal structures are unremarkable. IMPRESSION: No active disease. Electronically Signed   By: Elgie Collard M.D.   On: 10/11/2017 01:16     STUDIES:  CXR 10/25 > No acute   CULTURES: Blood 10/25 >>  U/A 10/25 > Negative Bacteria, + Ketones   ANTIBIOTICS: Vancomycin 10/25 >> Zosyn 10/25 >>   SIGNIFICANT EVENTS: 10/25 > Presents to ED   LINES/TUBES: PIV   DISCUSSION: 31 year old male with 1DM and schizophrenia, refusing to take insulin. Presenting to ED with recurrent DKA.    ASSESSMENT / PLAN:  PULMONARY A: Respiratory Insufficieny in setting of DKA  P:   Monitor  Trend ABG > Repeat 0400   CARDIOVASCULAR A:  H/O HTN  P:  Cardiac Monitoring   RENAL A:   Acute Kidney Injury (Bas 0.6-0.8) s/p 4L in ED  Crt 2.38 >  Hyperkalemia  Anion-Gap Metabolic Acidosis  LA 3.43 >  P:   Trend BMP q4h Replace electrolytes as indicated  Mag/Phos pending  Trend LA  NS @ 125 ml/hr  GASTROINTESTINAL A:   GERD  P:   NPO PPI   HEMATOLOGIC A:   Anemia  P:  Trend CBC  Transfuse for Hbg <7  Heparin SQ for VTE SCDS  INFECTIOUS A:   Leucocytosis  WBC 28.6 > LA 3.43 >  CXR/UA Clear  Left Diabetic Foot Ulcer  P:   Trend WBC and Fever Curve Trend PCT and LA  Follow Culture Data  Vancomycin and Zosyn  Wound care consult   ENDOCRINE A:   DKA H/O Noncompliant 1DM    P:   DKA protocol   NEUROLOGIC A:   Acute Metabolic Encephalopathy  H/O Schizophrenia, Depression   -ETOH <10, UDS +amphetaines  -Currently in 4 point restraints with active hallucinations  P:   Monitor  Restart Psych meds once able to take PO meds  Hold home Addreall, Trazodone  Avoid Sedative Medications    FAMILY  - Updates: No family at bedside   - Inter-disciplinary family meet or Palliative Care meeting due by: 10/18/2017   CC Time: 45 minutes   Jovita Kussmaul, AGACNP-BC [facility]  Pgr: [phone]  PCCM Pgr:  [phone]

## 2017-10-11 NOTE — Progress Notes (Signed)
eLink Physician-Brief Progress Note Patient Name: [name] DOB: 1986-01-04 MRN: [id number]   Date of Service  10/11/2017  HPI/Events of Note  Hypokalemia  eICU Interventions  Potassium replaced     Intervention Category Intermediate Interventions: Electrolyte abnormality - evaluation and management  Roxy Filler 10/11/2017, 8:15 PM

## 2017-10-11 NOTE — Progress Notes (Signed)
Inpatient Diabetes Program Recommendations  AACE/ADA: New Consensus Statement on Inpatient Glycemic Control (2015)  Target Ranges:  Prepandial:   less than 140 mg/dL      Peak postprandial:   less than 180 mg/dL (1-2 hours)      Critically ill patients:  140 - 180 mg/dL   Lab Results  Component Value Date   GLUCAP 103 (H) 10/11/2017   HGBA1C 7.8 (H) 10/11/2017    Review of Glycemic ControlResults for [name], [name] (MRN [id number]) as of 10/11/2017 10:41  Ref. Range 10/11/2017 04:19 10/11/2017 05:24 10/11/2017 06:42 10/11/2017 07:34 10/11/2017 08:07  Glucose-Capillary Latest Ref Range: 65 - 99 mg/dL 767 (H) 341 (H) 937 (H) 116 (H) 103 (H)   Diabetes history: Type 1 DM Outpatient Diabetes medications: Levemir 30 units daily, Novolog flexpen 0-15 units tid with meals Current orders for Inpatient glycemic control:  IV insulin-DKA order set  Inpatient Diabetes Program Recommendations:    Patient well known to inpatient diabetes program.  He has had 11 admissions in 6 months.  Likely that psychiatric history may affect his ability to perform self care and diabetes management.  Once acidosis cleared, should be able to resume home dose of Levemir and Novolog.  Will follow.  Thanks, Beryl Meager, RN, BC-ADM Inpatient Diabetes Coordinator Pager [phone] (8a-5p)

## 2017-10-11 NOTE — Progress Notes (Signed)
PULMONARY / CRITICAL CARE MEDICINE   Name: [name] MRN: [id number] DOB: [birth year]    ADMISSION DATE:  10/10/2017 CONSULTATION DATE:  10/11/2017  REFERRING MD:  Dr. Elesa Massed   CHIEF COMPLAINT:  DKA   HISTORY OF PRESENT ILLNESS:   31 year old male with 1DM with multiple DKA admissions due to medical non-compliance, and schizophrenia   Presents to ED on 10/24 with AMS and hyperglycemia. Patient resides with a roommate whom found patient and called EMS. Upon arrival to ED with Kussmaul breathing, Glucose 788, PH 6.897, and WBC 28.6, LA 3.43. PCCM asked to admit.      SUBJECTIVE:  No acute events overnight. Patient started on broad spectrum abx and insulin gtt for DKA and sepsis 2/2 foot ulcer. Complaining that he wants to have a bowel movement this AM.  VITAL SIGNS: BP 110/69   Pulse (!) 117   Temp 98.4 F (36.9 C)   Resp 15   Ht 6\' 1"  (1.854 m)   Wt 146 lb 6.2 oz (66.4 kg)   SpO2 97%   BMI 19.31 kg/m   HEMODYNAMICS:    VENTILATOR SETTINGS:    INTAKE / OUTPUT: I/O last 3 completed shifts: In: 5063.5 [I.V.:1963.5; IV Piggyback:3100] Out: 2875 [Urine:2875]  PHYSICAL EXAMINATION: General:  NAD, resting in bed Neuro:  Responds to questions, fixates on wanting to have a BM, knows he's in a hospital Cardiovascular:  RRR,, no MRG  Lungs:  CTA bil, no W/R/R  Abdomen:  Soft, nt/ nd  Musculoskeletal: no LE edema Skin:  Dry, left heal diabetic foot ulcer   LABS:  BMET  Recent Labs Lab 10/10/17 2324 10/11/17 0010 10/11/17 0100 10/11/17 0318  NA 132* 131* 132* 141  K 5.5* 5.3* 5.3* 3.7  CL 96* 103 92* 112*  CO2 <7*  --  <7* 7*  BUN 25* 29* 25* 21*  CREATININE 2.20* 1.20 2.38* 1.78*  GLUCOSE 788* >700* 819* 426*    Electrolytes  Recent Labs Lab 10/10/17 2324 10/11/17 0100 10/11/17 0318  CALCIUM 8.9 8.9 6.6*  MG  --  2.5*  --   PHOS  --  9.2*  --     CBC  Recent Labs Lab 10/10/17 2324 10/11/17 0010 10/11/17 0535 10/11/17 0731  WBC 28.6*   --  18.6* 17.9*  HGB 12.8* 14.6 9.2* 9.2*  HCT 38.9* 43.0 26.9* 26.7*  PLT 422*  --  302 294    Coag's No results for input(s): APTT, INR in the last 168 hours.  Sepsis Markers  Recent Labs Lab 10/11/17 0010 10/11/17 0100 10/11/17 0318  LATICACIDVEN 3.43*  --  2.6*  PROCALCITON  --  0.18  --     ABG  Recent Labs Lab 10/11/17 0355  PHART 7.157*  PCO2ART PENDING  PO2ART PENDING    Liver Enzymes No results for input(s): AST, ALT, ALKPHOS, BILITOT, ALBUMIN in the last 168 hours.  Cardiac Enzymes No results for input(s): TROPONINI, PROBNP in the last 168 hours.  Glucose  Recent Labs Lab 10/11/17 0057 10/11/17 0201 10/11/17 0319 10/11/17 0419 10/11/17 0524 10/11/17 0642  GLUCAP >600* >600* 392* 348* 236* 163*    Imaging Dg Chest Port 1 View  Result Date: 10/11/2017 CLINICAL DATA:  31 year old male with diabetic ketoacidosis. EXAM: PORTABLE CHEST 1 VIEW COMPARISON:  Chest radiograph dated 10/11/2017 FINDINGS: Left IJ central venous line with tip over the central SVC close to the cavoatrial junction. Mild prominence of the hilar vasculature. Small left infrahilar hazy density may represent atelectatic  changes or crowding of the prominent vasculature although infiltrate is not excluded. Clinical correlation is recommended. PA and lateral views of the chest may provide better evaluation. There is no pleural effusion or pneumothorax. The cardiac silhouette is within normal limits. No acute osseous pathology. IMPRESSION: 1. Left IJ central venous catheter with tip close to the cavoatrial junction. No pneumothorax. 2. Patchy area of hazy density in the left infrahilar region may represent atelectasis or vascular crowding. Infiltrate not excluded. Clinical correlation is recommended. PA and lateral views of the chest may provide better evaluation. Electronically Signed   By: Elgie Collard M.D.   On: 10/11/2017 03:52   Dg Chest Port 1 View  Result Date:  10/11/2017 CLINICAL DATA:  31 year old male with sepsis. EXAM: PORTABLE CHEST 1 VIEW COMPARISON:  Chest radiograph dated 09/14/2017 FINDINGS: The heart size and mediastinal contours are within normal limits. Both lungs are clear. The visualized skeletal structures are unremarkable. IMPRESSION: No active disease. Electronically Signed   By: Elgie Collard M.D.   On: 10/11/2017 01:16     STUDIES:  CXR 10/25 > No acute   CULTURES: Blood 10/25 >>  U/A 10/25 > Negative Bacteria, + Ketones   ANTIBIOTICS: Vancomycin 10/25 >> Zosyn 10/25 >>   SIGNIFICANT EVENTS: 10/25 > Presents to ED   LINES/TUBES: PIV   DISCUSSION: 31 year old male with 1DM and schizophrenia, refusing to take insulin. Presenting to ED with recurrent DKA.    ASSESSMENT / PLAN:  PULMONARY A: Respiratory Insufficieny in setting of DKA  4AM ABG 7.157/bicarb 4.7/CO2 pending P:   Monitor   CARDIOVASCULAR A:  H/O HTN, currently hypotensive P:  Cardiac Monitoring   RENAL A:   Acute Kidney Injury (Bas 0.6-0.8) s/p 4L in ED  Crt 2.38 > 1.78 Anion-Gap Metabolic Acidosis due to DKA and lactic acidosis LA 3.43 > >2.6 P:   Trend BMP q4h Replace electrolytes as indicated, monitor potassium with insulin gtt Trend LA  NS @ 125 ml/hr  GASTROINTESTINAL A:   GERD  P:   NPO with insulin gtt PPI   HEMATOLOGIC A:   Anemia  P:  Trend CBC  Transfuse for Hbg <7  Heparin SQ for VTE SCDS  INFECTIOUS A:   Leucocytosis  WBC 28.6 > LA 3.43 >  PCT 3.06 >>0.18 CXR/UA Clear  Left Diabetic Foot Ulcer  P:   Trend WBC and Fever Curve Trend PCT and LA  Follow Culture Data  Vancomycin and Zosyn  Wound care consult   ENDOCRINE A:   DKA H/O Noncompliant 1DM    P:   DKA protocol: currently phase 1 on insulin gtt. Monitor CBGs, frequent BMP  NEUROLOGIC A:   Acute Metabolic Encephalopathy  H/O Schizophrenia, Depression   -ETOH <10, UDS +amphetamines (Aderall on med list) -Currently in 4 point restraints  with active hallucinations  P:   Monitor  Restart Psych meds once able to take PO meds  Hold home Aaderall, Trazodone  Avoid Sedative Medications   FAMILY  - Updates: No family at bedside   - Inter-disciplinary family meet or Palliative Care meeting due by: 10/18/2017  Howard Pouch, MD PGY-2 [facility]

## 2017-10-11 NOTE — Progress Notes (Signed)
eLink Physician-Brief Progress Note Patient Name: [name] DOB: 28-Jul-1986 MRN: [id number]   Date of Service  10/11/2017  HPI/Events of Note  31 year old male with severe DKA increased somnolence with increased respiratory rate along with hypotension with a pH of 6.8 lactic acid of 4 blood sugars over 700 with a potassium of 5.3  eICU Interventions  Admit to ICU Insulin infusion and IV fluids Monitor respiratory status f     Intervention Category Evaluation Type: New Patient Evaluation  Haward Pope 10/11/2017, 12:22 AM

## 2017-10-11 NOTE — Progress Notes (Signed)
BMP results reported to [place], NP without any new orders given.

## 2017-10-11 NOTE — Consult Note (Addendum)
WOC Nurse wound consult note Reason for Consult: Consult requested for left foot wound. Pt is familiar to [facility] team from several previous admissions; he developed an unstageable pressure injury to his heel when unresponsive at home about two months ago and has been using Bactroban prior to admission. Wound type: Healing stage 3 pressure injury; now located only on left plantar foot Pressure Injury POA: Yes Measurement: Wound has decreased in size since previous admission; 4X2.5X.1cm Wound bed: 100% red and dry, dry calloused edges surrounding Drainage (amount, consistency, odor) Scant amt tan drainage, no odor or fluctuance Dressing procedure/placement/frequency: Float heel to reduce pressure. Continue previous plan of care with Bactroban to provide antimicrobial benefits and promote moist healing. Please re-consult if further assistance is needed.  Thank-you,  Cammie Mcgee MSN, RN, CWOCN, [place], CNS [phone]

## 2017-10-12 ENCOUNTER — Inpatient Hospital Stay (HOSPITAL_COMMUNITY): Payer: Medicare Other

## 2017-10-12 DIAGNOSIS — F1410 Cocaine abuse, uncomplicated: Secondary | ICD-10-CM

## 2017-10-12 DIAGNOSIS — F17210 Nicotine dependence, cigarettes, uncomplicated: Secondary | ICD-10-CM

## 2017-10-12 DIAGNOSIS — L8990 Pressure ulcer of unspecified site, unspecified stage: Secondary | ICD-10-CM

## 2017-10-12 DIAGNOSIS — E1110 Type 2 diabetes mellitus with ketoacidosis without coma: Secondary | ICD-10-CM

## 2017-10-12 DIAGNOSIS — L89603 Pressure ulcer of unspecified heel, stage 3: Secondary | ICD-10-CM

## 2017-10-12 DIAGNOSIS — F1910 Other psychoactive substance abuse, uncomplicated: Secondary | ICD-10-CM

## 2017-10-12 DIAGNOSIS — F909 Attention-deficit hyperactivity disorder, unspecified type: Secondary | ICD-10-CM

## 2017-10-12 DIAGNOSIS — A419 Sepsis, unspecified organism: Secondary | ICD-10-CM

## 2017-10-12 DIAGNOSIS — F1210 Cannabis abuse, uncomplicated: Secondary | ICD-10-CM

## 2017-10-12 DIAGNOSIS — Z9114 Patient's other noncompliance with medication regimen: Secondary | ICD-10-CM

## 2017-10-12 DIAGNOSIS — Z599 Problem related to housing and economic circumstances, unspecified: Secondary | ICD-10-CM

## 2017-10-12 LAB — BLOOD CULTURE ID PANEL (REFLEXED)
ACINETOBACTER BAUMANNII: NOT DETECTED
CANDIDA ALBICANS: NOT DETECTED
CANDIDA GLABRATA: NOT DETECTED
CANDIDA KRUSEI: NOT DETECTED
CANDIDA PARAPSILOSIS: NOT DETECTED
CANDIDA TROPICALIS: NOT DETECTED
ENTEROBACTER CLOACAE COMPLEX: NOT DETECTED
ENTEROBACTERIACEAE SPECIES: NOT DETECTED
ESCHERICHIA COLI: NOT DETECTED
Enterococcus species: NOT DETECTED
Haemophilus influenzae: NOT DETECTED
KLEBSIELLA OXYTOCA: NOT DETECTED
KLEBSIELLA PNEUMONIAE: NOT DETECTED
Listeria monocytogenes: NOT DETECTED
METHICILLIN RESISTANCE: DETECTED — AB
Neisseria meningitidis: NOT DETECTED
PSEUDOMONAS AERUGINOSA: NOT DETECTED
Proteus species: NOT DETECTED
STREPTOCOCCUS PNEUMONIAE: NOT DETECTED
STREPTOCOCCUS PYOGENES: NOT DETECTED
Serratia marcescens: NOT DETECTED
Staphylococcus aureus (BCID): NOT DETECTED
Staphylococcus species: DETECTED — AB
Streptococcus agalactiae: NOT DETECTED
Streptococcus species: NOT DETECTED

## 2017-10-12 LAB — CBC WITH DIFFERENTIAL/PLATELET
BASOS ABS: 0 10*3/uL (ref 0.0–0.1)
Basophils Relative: 0 %
EOS ABS: 0.1 10*3/uL (ref 0.0–0.7)
EOS PCT: 1 %
HCT: 25.4 % — ABNORMAL LOW (ref 39.0–52.0)
Hemoglobin: 8.7 g/dL — ABNORMAL LOW (ref 13.0–17.0)
LYMPHS PCT: 17 %
Lymphs Abs: 1.4 10*3/uL (ref 0.7–4.0)
MCH: 28.9 pg (ref 26.0–34.0)
MCHC: 34.3 g/dL (ref 30.0–36.0)
MCV: 84.4 fL (ref 78.0–100.0)
MONO ABS: 0.4 10*3/uL (ref 0.1–1.0)
Monocytes Relative: 5 %
Neutro Abs: 6.4 10*3/uL (ref 1.7–7.7)
Neutrophils Relative %: 77 %
PLATELETS: 236 10*3/uL (ref 150–400)
RBC: 3.01 MIL/uL — AB (ref 4.22–5.81)
RDW: 14 % (ref 11.5–15.5)
WBC: 8.3 10*3/uL (ref 4.0–10.5)

## 2017-10-12 LAB — BASIC METABOLIC PANEL
ANION GAP: 7 (ref 5–15)
Anion gap: 8 (ref 5–15)
BUN: 7 mg/dL (ref 6–20)
BUN: 5 mg/dL — AB (ref 6–20)
CALCIUM: 7.9 mg/dL — AB (ref 8.9–10.3)
CHLORIDE: 112 mmol/L — AB (ref 101–111)
CO2: 20 mmol/L — ABNORMAL LOW (ref 22–32)
CO2: 18 mmol/L — ABNORMAL LOW (ref 22–32)
CREATININE: 1.02 mg/dL (ref 0.61–1.24)
Calcium: 8.1 mg/dL — ABNORMAL LOW (ref 8.9–10.3)
Chloride: 111 mmol/L (ref 101–111)
Creatinine, Ser: 0.92 mg/dL (ref 0.61–1.24)
GFR calc Af Amer: >60 mL/min (ref >60)
GLUCOSE: 246 mg/dL — AB (ref 65–99)
GLUCOSE: 232 mg/dL — AB (ref 65–99)
POTASSIUM: 3.2 mmol/L — AB (ref 3.5–5.1)
POTASSIUM: 3.9 mmol/L (ref 3.5–5.1)
SODIUM: 138 mmol/L (ref 135–145)
SODIUM: 138 mmol/L (ref 135–145)

## 2017-10-12 LAB — GLUCOSE, CAPILLARY
GLUCOSE-CAPILLARY: 264 mg/dL — AB (ref 65–99)
GLUCOSE-CAPILLARY: 138 mg/dL — AB (ref 65–99)
Glucose-Capillary: 216 mg/dL — ABNORMAL HIGH (ref 65–99)
Glucose-Capillary: 171 mg/dL — ABNORMAL HIGH (ref 65–99)
Glucose-Capillary: 228 mg/dL — ABNORMAL HIGH (ref 65–99)

## 2017-10-12 LAB — PHOSPHORUS: Phosphorus: 1.1 mg/dL — ABNORMAL LOW (ref 2.5–4.6)

## 2017-10-12 LAB — PROCALCITONIN: PROCALCITONIN: 0.46 ng/mL

## 2017-10-12 LAB — MAGNESIUM: MAGNESIUM: 1.4 mg/dL — AB (ref 1.7–2.4)

## 2017-10-12 MED ORDER — INSULIN DETEMIR 100 UNIT/ML ~~LOC~~ SOLN
25.0000 [IU] | Freq: Every day | SUBCUTANEOUS | Status: DC
  Administered 2017-10-12: 25 [IU] via SUBCUTANEOUS
  Filled 2017-10-12: qty 0.25

## 2017-10-12 MED ORDER — POTASSIUM CHLORIDE 20 MEQ PO PACK
30.0000 meq | PACK | ORAL | Status: DC
  Filled 2017-10-12: qty 2

## 2017-10-12 MED ORDER — POTASSIUM CHLORIDE 20 MEQ/15ML (10%) PO SOLN
30.0000 meq | ORAL | Status: AC
  Administered 2017-10-12 (×2): 30 meq via ORAL
  Filled 2017-10-12 (×2): qty 30

## 2017-10-12 MED ORDER — K PHOS MONO-SOD PHOS DI & MONO 155-852-130 MG PO TABS
500.0000 mg | ORAL_TABLET | Freq: Four times a day (QID) | ORAL | Status: DC
  Administered 2017-10-12 (×2): 500 mg via ORAL
  Filled 2017-10-12 (×3): qty 2

## 2017-10-12 MED ORDER — POTASSIUM CHLORIDE CRYS ER 20 MEQ PO TBCR
30.0000 meq | EXTENDED_RELEASE_TABLET | ORAL | Status: DC
  Filled 2017-10-12: qty 1

## 2017-10-12 MED ORDER — MAGNESIUM SULFATE 2 GM/50ML IV SOLN
2.0000 g | Freq: Once | INTRAVENOUS | Status: AC
Start: 2017-10-12 — End: 2017-10-12
  Administered 2017-10-12: 2 g via INTRAVENOUS
  Filled 2017-10-12: qty 50

## 2017-10-12 MED ORDER — INSULIN ASPART 100 UNIT/ML ~~LOC~~ SOLN
0.0000 [IU] | Freq: Three times a day (TID) | SUBCUTANEOUS | Status: DC
Start: 2017-10-12 — End: 2017-10-13
  Administered 2017-10-12: 8 [IU] via SUBCUTANEOUS
  Administered 2017-10-12: 2 [IU] via SUBCUTANEOUS
  Administered 2017-10-12 (×2): 5 [IU] via SUBCUTANEOUS

## 2017-10-12 MED ORDER — INSULIN GLARGINE 100 UNIT/ML ~~LOC~~ SOLN
15.0000 [IU] | Freq: Once | SUBCUTANEOUS | Status: AC
  Administered 2017-10-12: 15 [IU] via SUBCUTANEOUS
  Filled 2017-10-12: qty 0.15

## 2017-10-12 MED ORDER — INSULIN ASPART 100 UNIT/ML ~~LOC~~ SOLN
3.0000 [IU] | Freq: Three times a day (TID) | SUBCUTANEOUS | Status: DC
  Administered 2017-10-12 – 2017-10-13 (×3): 3 [IU] via SUBCUTANEOUS

## 2017-10-12 MED ORDER — K PHOS MONO-SOD PHOS DI & MONO 155-852-130 MG PO TABS
500.0000 mg | ORAL_TABLET | Freq: Four times a day (QID) | ORAL | Status: DC
  Administered 2017-10-12: 500 mg via ORAL
  Filled 2017-10-12 (×2): qty 2

## 2017-10-12 NOTE — Progress Notes (Addendum)
Inpatient Diabetes Program Recommendations  AACE/ADA: New Consensus Statement on Inpatient Glycemic Control (2015)  Target Ranges:  Prepandial:   less than 140 mg/dL      Peak postprandial:   less than 180 mg/dL (1-2 hours)      Critically ill patients:  140 - 180 mg/dL   Lab Results  Component Value Date   GLUCAP 264 (H) 10/12/2017   HGBA1C 7.8 (H) 10/11/2017    Review of Glycemic Control Results for [name], [name] (MRN [id number]) as of 10/12/2017 09:44  Ref. Range 10/12/2017 07:34  Glucose-Capillary Latest Ref Range: 65 - 99 mg/dL 309 (H)   Diabetes history: Type 1 DM Outpatient Diabetes medications: Levemir 30 units daily, Novolog flexpen 0-15 units tid with meals Current orders for Inpatient glycemic control:  Transitioned off insulin drip to Lantus 15 units overnight, Novolog moderate tid with meals  Inpatient Diabetes Program Recommendations:   Please consider adding Levemir 25 units q HS.  Also consider reducing Novolog correction to sensitive tid with meals and add Novolog meal coverage 4 units tid with meals.  Thanks, Beryl Meager, RN, BC-ADM Inpatient Diabetes Coordinator Pager [phone] (8a-5p)  1355-Spoke with patient regarding importance of taking insulin.  He insists that he is taking his insulin consistently and checking blood sugars.  He states that he has all supplies needed for diabetes.  We discussed importance of taking basal insulin even when he does not eat.  RN states that patient had indicated he did not understand diabetes.  However when we talked, he seemed to think that the DKA was due to his inability to eat.  We discussed that he could purchase ketone sticks to assess for acidosis and the importance of getting assistance/help if he is getting sick.  May benefit from outpatient endocrinologist as well.  He is concerned about not being able to swallow food.  Again very difficult patient situation.  He needs lots of outpatient support, however it does  not seem he has close support system.  He needs to avoid DKA, however this will require consistent diabetes self-care which I am not sure he can do?  Will follow.

## 2017-10-12 NOTE — Progress Notes (Signed)
Pt transported to 6N32. Report given. VS stable. Belongings with the pt

## 2017-10-12 NOTE — Evaluation (Signed)
Clinical/Bedside Swallow Evaluation Patient Details  Name: [name] MRN: [id number] Date of Birth: 04-28-1986  Today's Date: 10/12/2017 Time: SLP Start Time (ACUTE ONLY): 1202 SLP Stop Time (ACUTE ONLY): 1212 SLP Time Calculation (min) (ACUTE ONLY): 10 min  Past Medical History:  Past Medical History:  Diagnosis Date  . Anemia   . Anxiety   . Arthritis    "both hips; both shoulders" (06/06/2017)  . Chicken pox   . Childhood asthma   . Chronic pain   . Depression   . DKA (diabetic ketoacidoses) ([name]) 11/18/2014  . DKA (diabetic ketoacidoses) ([name]) 07/05/2017  . GERD (gastroesophageal reflux disease)   . Hypertension   . Migraine    "a few/year" (06/06/2017)  . Noncompliance with medication regimen   . Pneumonia    "several times" (06/06/2017)  . Polysubstance abuse ([name])   . Schizo affective schizophrenia ([name])   . Scoliosis   . Type I diabetes mellitus ([name])    Past Surgical History:  Past Surgical History:  Procedure Laterality Date  . CARDIAC CATHETERIZATION  11/2016  . INCISION AND DRAINAGE ABSCESS Left 11/2011   "MRSA removed off my thumb"   HPI:  31 yo M with schizoaffective disorder, T1DM, medical noncompliance and frequent admissions for DKA who presented obtunded in DKA and was admitted to the ICU. Dx includes possible sepsis, c/o dysphagia with liquids and solids, per notes, he states he can't swallow any foods, and therefore is afraid to take insulin. Pt has undergone two clinical swallow evals since 6/18, both of which revealed functional oropharyngeal swallow but described findings related to GERD.    Assessment / Plan / Recommendation Clinical Impression  Pt presents with no s/s of an oropharyngeal dysphagia - mastication is normal, swallow response is swift, there are no s/s of aspiration and pt passed three oz water test without difficulty.  However, he c/o persisting globus, and identified solid food "sticking" behind sternum during assessment.   Given ongoing  symptoms, recommend consideration of esophageal w/u while pt is inpatient.  No further SLP f/u is warranted - our services will sign off.  SLP Visit Diagnosis: Dysphagia, unspecified (R13.10)    Aspiration Risk  No limitations    Diet Recommendation   regular solids, thin liquids  Medication Administration: Whole meds with liquid    Other  Recommendations Recommended Consults: Consider esophageal assessment Oral Care Recommendations: Oral care BID   Follow up Recommendations None      Frequency and Duration            Prognosis        Swallow Study   General Date of Onset: 10/10/17 HPI: 31 yo M with schizoaffective disorder, T1DM, medical noncompliance and frequent admissions for DKA who presented obtunded in DKA and was admitted to the ICU. Dx includes possible sepsis, c/o dysphagia with liquids and solids, per notes, he states he can't swallow any foods, and therefore is afraid to take insulin. Pt has undergone two clinical swallow evals since 6/18, both of which revealed functional oropharyngeal swallow but described findings related to GERD.  Type of Study: Bedside Swallow Evaluation Previous Swallow Assessment: see HPI Diet Prior to this Study: NPO Temperature Spikes Noted: Yes Respiratory Status: Room air History of Recent Intubation: No Behavior/Cognition: Alert Oral Cavity Assessment: Within Functional Limits Oral Care Completed by SLP: Recent completion by staff Oral Cavity - Dentition: Missing dentition;Poor condition Vision: Functional for self-feeding Self-Feeding Abilities: Able to feed self Patient Positioning: Upright in bed Baseline Vocal  Quality: Normal Volitional Cough: Strong Volitional Swallow: Able to elicit    Oral/Motor/Sensory Function Overall Oral Motor/Sensory Function: Within functional limits   Ice Chips Ice chips: Within functional limits   Thin Liquid Thin Liquid: Within functional limits    Nectar Thick Nectar Thick  Liquid: Not tested   Honey Thick Honey Thick Liquid: Not tested   Puree Puree: Within functional limits   Solid   GO   Solid: Within functional limits        Blenda Mounts Laurice 10/12/2017,12:16 PM  Jordain Radin L. Samson Frederic, [place] CCC/SLP Pager [phone]

## 2017-10-12 NOTE — Progress Notes (Signed)
eLink Physician-Brief Progress Note Patient Name: PONCE SKILLMAN DOB: 1986/11/25 MRN: [id number]   Date of Service  10/12/2017  HPI/Events of Note  AG of 7 with CBG under control.  HCO3 is 18 but could be related to large volume saline given over time.  Patient is alert and requesting to eat  eICU Interventions  Plan: lantus 15 units subq now SSI AC and HS Rounding team to determine insulin dosage in AM Advance diet as tolerated     Intervention Category Intermediate Interventions: Hyperglycemia - evaluation and treatment  Shaneil Yazdi 10/12/2017, 12:02 AM

## 2017-10-12 NOTE — Progress Notes (Addendum)
PROGRESS NOTE    [name]  ZOX:[id number] DOB: 02/18/1986 DOA: 10/10/2017 PCP: Fleet Contras, MD      Brief Narrative:  31 yo M with schizoaffective disorder, T1DM and medical noncompliance and frequent admissions for DKA who presented obtunded in DKA and was admitted to the ICU.  He did not require intubation this time.  His gap corrected over about 24 hours with insulin and bicarb gtt and he was transitioned last night at midnight to subQ insulin and transferred to the hospitalist service.     Assessment & Plan:  Active Problems:   DKA (diabetic ketoacidoses) ([name])   Encephalopathy acute   Pressure injury of skin   DKA Sugars going back up this morning.  Due to noncompliance (states he can't eat due to #4 below, therefore is afraid to take insulin). -Check BMP -Resume home dose Levemir 30 units this evening -SSI with meals -Consult Diabetes education for education re: use of basal insulin when not eating    Possible sepsis Patient technically meets criteria given tachycardia, tachypnea, hypothermia, leukocytosis, and evidence of organ dysfunction.  Lactate 3.4 mmol/L on admission and repeat ordered within 6 hours normalized.  -30 ml/kg bolus given  -Antibiotics: vancomycin and Zosyn -Follow cultures (1/2 cultures growing CoNS, likely contaminant), urine clear, foot source? Pneumonia seems more likely given hazy opacity in left infrahilar region on repeat CXR and rising PCT  Schizoaffective disorder Patient states he takes Adderall for schizoaffective d/o, will not discuss alternatives.  Refuses Risperdal here.  [place] Cont[place] subs [place] confirms he is prescribed daily Adderall by Dr. Concepcion Elk. -Consult to Psychiatry re: capacity -Continue to offer Risperdal  Pressure injury, left heel, stage III Present on admission.  From an unresponsive episode some weeks or months ago. -WOC consult  Dysphagia Complains of this for months.  He has dysphagia for liquids  and solids.  Can't swallow any food he says, and is therefore afraid to take insulin.    Last SLP eval I see was in August, 8/22, after intubation, evaluation limited at that time, SLP had no further follow up, just did bedside eval and then patient left hospital.   -SLP eval, potentially for barium esophagram  Hypophosphatemia Aggressively supplement  Hypomagnesemia Supplement       DVT prophylaxis: Heparin Code Status: FULL Family Communication: None present Disposition Plan: Continue subQ insulin, likely 1-2 days more.  SLP eval and possibly further evaluation of dysphagia.  He is high risk to leave AMA.   Consultants:   PCCM, SLP, WOC  Procedures:   None  Antimicrobials:   Vancomycin 10/25 >>  Zosyn 10/25 >>    Culture data:  Blood culture 10/25 >> 1/2 CoNS          Subjective: Awake, answering questions.  Throat discomfort, right shoulder pain.  Otherwise doesn't know why he is here, just feels tired.  No chest pain, no confusion.  Objective: Vitals:   10/12/17 0500 10/12/17 0600 10/12/17 0700 10/12/17 0800  BP: 97/67 (!) 86/50 97/60 110/70  Pulse: (!) 112 (!) 112 (!) 111 (!) 111  Resp: 12 14 13 15   Temp: 99.5 F (37.5 C) 99.5 F (37.5 C) 99.5 F (37.5 C) 99.3 F (37.4 C)  TempSrc:   Core (Comment)   SpO2: 100% 100% 100% 100%  Weight: 67.7 kg (149 lb 4 oz)     Height:        Intake/Output Summary (Last 24 hours) at 10/12/17 0913 Last data filed at 10/12/17 8119  Gross  per 24 hour  Intake          3540.92 ml  Output             1765 ml  Net          1775.92 ml   Filed Weights   10/11/17 0200 10/11/17 0600 10/12/17 0500  Weight: 78.5 kg (173 lb 1 oz) 66.4 kg (146 lb 6.2 oz) 67.7 kg (149 lb 4 oz)    Examination: General appearance: Young adult male, alert and in no acute distress.  Appears tired.  Chronically ill. HEENT: Anicteric, conjunctiva pink, lids and lashes normal. No nasal deformity, discharge, epistaxis.  Lips dry, OP moist.    Skin: Warm and dry.  No jaundice.  No suspicious rashes or lesions.  Heel lesion noted, minimal surrounding redness, no drainage. Cardiac: Tchycardic, regular, nl S1-S2, no murmurs appreciated.  Capillary refill is brisk.  JVP normal.  No LE edema.  Radial pulses 2+ and symmetric. Respiratory: Normal respiratory rate and rhythm.  CTAB without rales or wheezes. Abdomen: Abdomen soft.  Nonfocal TTP no guarding. No ascites, distension, hepatosplenomegaly.   MSK: No deformities or effusions. Neuro: Awake and alert, tired.  EOMI, moves all extremities. Speech fluent, not pressured.    Psych: Sensorium intact and responding to questions, attention normal. Affect combative.  Judgment and insight appear poor.  No SI.  No HI.  No active hallucinations.     Data Reviewed: I have personally reviewed following labs and imaging studies:  CBC:  Recent Labs Lab 10/10/17 2324 10/11/17 0010 10/11/17 0535 10/11/17 0731 10/12/17 0534  WBC 28.6*  --  18.6* 17.9* 8.3  NEUTROABS  --   --   --  15.4* 6.4  HGB 12.8* 14.6 9.2* 9.2* 8.7*  HCT 38.9* 43.0 26.9* 26.7* 25.4*  MCV 90.7  --  84.9 84.2 84.4  PLT 422*  --  302 294 236   Basic Metabolic Panel:  Recent Labs Lab 10/11/17 0100  10/11/17 1105 10/11/17 1505 10/11/17 1901 10/11/17 2318 10/12/17 0534  NA 132*  < > 139 139 139 139 138  K 5.3*  < > 3.5 3.7 3.2* 3.7 3.2*  CL 92*  < > 115* 113* 114* 114* 111  CO2 <7*  < > 17* 12* 15* 18* 20*  GLUCOSE 819*  < > 102* 207* 244* 190* 246*  BUN 25*  < > 14 13 12 10 7   CREATININE 2.38*  < > 1.22 1.13 1.34* 1.09 0.92  CALCIUM 8.9  < > 7.7* 7.9* 8.0* 7.8* 7.9*  MG 2.5*  --   --   --   --   --  1.4*  PHOS 9.2*  --   --   --   --   --  1.1*  < > = values in this interval not displayed. GFR: Estimated Creatinine Clearance: 110.4 mL/min (by C-G formula based on SCr of 0.92 mg/dL). Liver Function Tests: No results for input(s): AST, ALT, ALKPHOS, BILITOT, PROT, ALBUMIN in the last 168 hours. No results  for input(s): LIPASE, AMYLASE in the last 168 hours. No results for input(s): AMMONIA in the last 168 hours. Coagulation Profile: No results for input(s): INR, PROTIME in the last 168 hours. Cardiac Enzymes: No results for input(s): CKTOTAL, CKMB, CKMBINDEX, TROPONINI in the last 168 hours. BNP (last 3 results) No results for input(s): PROBNP in the last 8760 hours. HbA1C:  Recent Labs  10/11/17 0100  HGBA1C 7.8*   CBG:  Recent Labs Lab 10/11/17  2007 10/11/17 2110 10/11/17 2211 10/11/17 2307 10/12/17 0734  GLUCAP 167* 148* 156* 169* 264*   Lipid Profile: No results for input(s): CHOL, HDL, LDLCALC, TRIG, CHOLHDL, LDLDIRECT in the last 72 hours. Thyroid Function Tests: No results for input(s): TSH, T4TOTAL, FREET4, T3FREE, THYROIDAB in the last 72 hours. Anemia Panel: No results for input(s): VITAMINB12, FOLATE, FERRITIN, TIBC, IRON, RETICCTPCT in the last 72 hours. Urine analysis:    Component Value Date/Time   COLORURINE YELLOW 10/11/2017 0130   APPEARANCEUR CLEAR 10/11/2017 0130   LABSPEC 1.017 10/11/2017 0130   PHURINE 5.0 10/11/2017 0130   GLUCOSEU >=500 (A) 10/11/2017 0130   HGBUR MODERATE (A) 10/11/2017 0130   BILIRUBINUR NEGATIVE 10/11/2017 0130   BILIRUBINUR negative 03/29/2015 1114   KETONESUR 80 (A) 10/11/2017 0130   PROTEINUR NEGATIVE 10/11/2017 0130   UROBILINOGEN 0.2 05/17/2015 1810   NITRITE NEGATIVE 10/11/2017 0130   LEUKOCYTESUR NEGATIVE 10/11/2017 0130   Sepsis Labs: @LABRCNTIP (procalcitonin:4,lacticidven:4)  ) Recent Results (from the past 240 hour(s))  Blood culture (routine x 2)     Status: None (Preliminary result)   Collection Time: 10/11/17 12:04 AM  Result Value Ref Range Status   Specimen Description BLOOD RIGHT HAND  Final   Special Requests   Final    BOTTLES DRAWN AEROBIC ONLY Blood Culture adequate volume   Culture  Setup Time   Final    GRAM POSITIVE COCCI IN CLUSTERS AEROBIC BOTTLE ONLY CRITICAL RESULT CALLED TO, READ BACK  BY AND VERIFIED WITH: G ABBOTT,[name] AT 0500 10/12/17 BY T [place]    Culture GRAM POSITIVE COCCI  Final   Report Status PENDING  Incomplete  Blood Culture ID Panel (Reflexed)     Status: Abnormal   Collection Time: 10/11/17 12:04 AM  Result Value Ref Range Status   Enterococcus species NOT DETECTED NOT DETECTED Final   Listeria monocytogenes NOT DETECTED NOT DETECTED Final   Staphylococcus species DETECTED (A) NOT DETECTED Final    Comment: Methicillin (oxacillin) resistant coagulase negative staphylococcus. Possible blood culture contaminant (unless isolated from more than one blood culture draw or clinical case suggests pathogenicity). No antibiotic treatment is indicated for blood  culture contaminants. CRITICAL RESULT CALLED TO, READ BACK BY AND VERIFIED WITH:  TO GABOTT([name]) BY TCLEVELAND 10/12/2017 AT 500AM    Staphylococcus aureus NOT DETECTED NOT DETECTED Final   Methicillin resistance DETECTED (A) NOT DETECTED Final    Comment: CRITICAL RESULT CALLED TO, READ BACK BY AND VERIFIED WITH:  TO GABOTT([name]) BY TCLEVELAND 10/12/2017 AT 500AM    Streptococcus species NOT DETECTED NOT DETECTED Final   Streptococcus agalactiae NOT DETECTED NOT DETECTED Final   Streptococcus pneumoniae NOT DETECTED NOT DETECTED Final   Streptococcus pyogenes NOT DETECTED NOT DETECTED Final   Acinetobacter baumannii NOT DETECTED NOT DETECTED Final   Enterobacteriaceae species NOT DETECTED NOT DETECTED Final   Enterobacter cloacae complex NOT DETECTED NOT DETECTED Final   Escherichia coli NOT DETECTED NOT DETECTED Final   Klebsiella oxytoca NOT DETECTED NOT DETECTED Final   Klebsiella pneumoniae NOT DETECTED NOT DETECTED Final   Proteus species NOT DETECTED NOT DETECTED Final   Serratia marcescens NOT DETECTED NOT DETECTED Final   Haemophilus influenzae NOT DETECTED NOT DETECTED Final   Neisseria meningitidis NOT DETECTED NOT DETECTED Final   Pseudomonas aeruginosa NOT DETECTED NOT DETECTED  Final   Candida albicans NOT DETECTED NOT DETECTED Final   Candida glabrata NOT DETECTED NOT DETECTED Final   Candida krusei NOT DETECTED NOT DETECTED Final   Candida parapsilosis NOT  DETECTED NOT DETECTED Final   Candida tropicalis NOT DETECTED NOT DETECTED Final  MRSA PCR Screening     Status: None   Collection Time: 10/11/17  3:31 AM  Result Value Ref Range Status   MRSA by PCR NEGATIVE NEGATIVE Final    Comment:        The GeneXpert MRSA Assay (FDA approved for NASAL specimens only), is one component of a comprehensive MRSA colonization surveillance program. It is not intended to diagnose MRSA infection nor to guide or monitor treatment for MRSA infections.          Radiology Studies: Dg Chest Port 1 View  Result Date: 10/11/2017 CLINICAL DATA:  31 year old male with diabetic ketoacidosis. EXAM: PORTABLE CHEST 1 VIEW COMPARISON:  Chest radiograph dated 10/11/2017 FINDINGS: Left IJ central venous line with tip over the central SVC close to the cavoatrial junction. Mild prominence of the hilar vasculature. Small left infrahilar hazy density may represent atelectatic changes or crowding of the prominent vasculature although infiltrate is not excluded. Clinical correlation is recommended. PA and lateral views of the chest may provide better evaluation. There is no pleural effusion or pneumothorax. The cardiac silhouette is within normal limits. No acute osseous pathology. IMPRESSION: 1. Left IJ central venous catheter with tip close to the cavoatrial junction. No pneumothorax. 2. Patchy area of hazy density in the left infrahilar region may represent atelectasis or vascular crowding. Infiltrate not excluded. Clinical correlation is recommended. PA and lateral views of the chest may provide better evaluation. Electronically Signed   By: Elgie Collard M.D.   On: 10/11/2017 03:52   Dg Chest Port 1 View  Result Date: 10/11/2017 CLINICAL DATA:  31 year old male with sepsis. EXAM:  PORTABLE CHEST 1 VIEW COMPARISON:  Chest radiograph dated 09/14/2017 FINDINGS: The heart size and mediastinal contours are within normal limits. Both lungs are clear. The visualized skeletal structures are unremarkable. IMPRESSION: No active disease. Electronically Signed   By: Elgie Collard M.D.   On: 10/11/2017 01:16        Scheduled Meds: . heparin  5,000 Units Subcutaneous Q8H  . insulin aspart  0-15 Units Subcutaneous TID AC & HS  . mouth rinse  15 mL Mouth Rinse BID  . mupirocin cream   Topical Daily  . pantoprazole (PROTONIX) IV  40 mg Intravenous Q24H  . potassium chloride  30 mEq Oral Q4H  . risperiDONE  0.5 mg Oral BID   Continuous Infusions: . dextrose 5 % and 0.45% NaCl 125 mL/hr at 10/12/17 0207  . insulin (NOVOLIN-R) infusion Stopped (10/12/17 0307)  . piperacillin-tazobactam (ZOSYN)  IV 3.375 g (10/12/17 0835)  . vancomycin Stopped (10/12/17 0012)     LOS: 1 day    Time spent: 40 minutes    Alberteen Sam, MD [facility] Pager [phone]  If 7PM-7AM, please contact night-coverage [email or web address] Password [email or web address] 10/12/2017, 9:13 AM

## 2017-10-12 NOTE — Progress Notes (Signed)
[facility] ADULT ICU REPLACEMENT PROTOCOL FOR AM LAB REPLACEMENT ONLY  The patient does apply for the [facility] Adult ICU Electrolyte Replacment Protocol based on the criteria listed below:   1. Is GFR >/= 40 ml/min? Yes.    Patient's GFR today is >60 2. Is urine output >/= 0.5 ml/kg/hr for the last 6 hours? Yes.   Patient's UOP is 1.7 ml/kg/hr 3. Is BUN < 60 mg/dL? Yes.    Patient's BUN today is 7 4. Abnormal electrolyte(s): 3.2 5. Ordered repletion with: per protocol 6. If a panic level lab has been reported, has the CCM MD in charge been notified? Yes.  .   Physician:  Dr. Roxy Manns, [place] Boos 10/12/2017 6:20 AM

## 2017-10-12 NOTE — Consult Note (Signed)
[facility] Consult   Reason for Consult:  Schizophrenia, polysubstance abuse non compliance with treatment Referring Physician:  Dr. Loleta Books Patient Identification: [name] MRN:  [id number] Principal Diagnosis: <principal problem not specified> Diagnosis:   Patient Active Problem List   Diagnosis Date Noted  . Pressure injury of skin [L89.90] 10/11/2017  . Encephalopathy acute [G93.40]   . Hyperbilirubinemia [E80.6] 09/14/2017  . Heel ulcer ([place]) [Y69.485] 09/14/2017  . Noncompliance with medication regimen [Z91.14]   . Hypokalemia [E87.6]   . Non healing left heel wound [S91.302A]   . Cellulitis [L03.90]   . DKA, type 1 ([place]) [E10.10] 08/17/2017  . Encounter for imaging study to confirm orogastric (OG) tube placement [Z01.89]   . Acute respiratory acidosis [E87.2]   . DKA (diabetic ketoacidoses) ([place]) [E13.10] 08/04/2017  . Acute respiratory failure with hypoxia ([place]) [J96.01]   . Seizure ([place]) [R56.9]   . Diabetic ketoacidosis ([place]) [E13.10] 07/30/2017  . Normocytic anemia [D64.9] 07/05/2017  . Numbness of right hand [R20.0] 07/05/2017  . Elevated troponin I measurement [R74.8]   . Positive D dimer [R79.89]   . DKA, type 1, not at goal [facility]) [E10.10] 07/01/2017  . Hyperkalemia [E87.5]   . AKI (acute kidney injury) ([place]) [N17.9]   . Medically noncompliant [Z91.19]   . Hyperglycemia [R73.9] 06/16/2017  . Nausea & vomiting [R11.2] 06/15/2017  . Abdominal pain [R10.9] 06/15/2017  . Chest pain [R07.9] 06/07/2017  . GERD (gastroesophageal reflux disease) [K21.9] 06/07/2017  . Substance abuse ([place]) [F19.10] 05/30/2016  . Substance induced mood disorder ([place]) [F19.94] 05/30/2016  . Tobacco use disorder [F17.200] 12/24/2015  . DM hyperosmolarity type I, uncontrolled ([place]) [E10.69, E10.65] 12/19/2015  . Generalized headache [R51] 07/22/2015  . Bilateral hip bursitis [M70.71, M70.72] 05/26/2015  . Depression [F32.9]   . Generalized anxiety disorder  [F41.1] 03/29/2015  . Undifferentiated schizophrenia ([place]) [F20.3]   . Hip pain, bilateral [M25.551, M25.552] 11/18/2014  . Dehydration [E86.0] 06/17/2012    Total Time spent with patient: 30 minutes  Subjective:   [name] is a 31 y.o. male patient admitted with .  HPI: [name] is a 31 year old male with 1DM with multiple DKA admissions due to medical non-compliance, and schizophrenia.  Patient seen for this face-to-face psychiatric consultation and evaluation of possible treatment needs.  Patient stated that he is doing good and he has no reported emotional or behavioral problems and has been staying away from the outpatient psychiatric services and receiving medication management from primary care physician.  Reportedly patient was diagnosed with the ADHD and has been receiving Adderall which she has been compliant but noncompliant with psychotropic medications.  Patient also suffering with multiple psychosocial stresses especially financial difficulties and not able to meeting his diabetic treatment needs.  He is also reported he is not interested further communicate with the psychiatrist and consider any psychotropic medication during this visit.  Patient denies current suicidal/homicidal ideation and has no evidence of psychosis. Patient urin drug screen is positive for Amphetamines only and he was on Adderall from PCP.  Past psychiatric history. He was diagnosed with schizoaffective disorder at the age of 5. In the past he's been tried on numerous medications including injectable Risperdal but did not like any of them and feels that Seroquel works best for him. He denies history of substance use but there is information in the chart he's been drinking and using cocaine at some point. He denies ever attempting suicide.  Family psychiatric history. The patient doesn't  stay in touch with his family except for his mother and has no information.  Social history. He is  originally from [place] where his mother still resides. He moved to [place] because it is cheaper here. She graduated from high school and went to college some. He is now disabled from mental illness. He has health insurance. He lives independently in an apartment in [place]. He has hard time paying his rent, electricity, phone and Internet bill and frequently runs out of money.  Risk to Self: Is patient at risk for suicide?: No Risk to Others:   Prior Inpatient Therapy:   Prior Outpatient Therapy:    Past Medical History:  Past Medical History:  Diagnosis Date  . Anemia   . Anxiety   . Arthritis    "both hips; both shoulders" (06/06/2017)  . Chicken pox   . Childhood asthma   . Chronic pain   . Depression   . DKA (diabetic ketoacidoses) ([place]) 11/18/2014  . DKA (diabetic ketoacidoses) ([place]) 07/05/2017  . GERD (gastroesophageal reflux disease)   . Hypertension   . Migraine    "a few/year" (06/06/2017)  . Noncompliance with medication regimen   . Pneumonia    "several times" (06/06/2017)  . Polysubstance abuse ([place])   . Schizo affective schizophrenia ([place])   . Scoliosis   . Type I diabetes mellitus ([place])     Past Surgical History:  Procedure Laterality Date  . CARDIAC CATHETERIZATION  11/2016  . INCISION AND DRAINAGE ABSCESS Left 11/2011   "MRSA removed off my thumb"   Family History:  Family History  Problem Relation Age of Onset  . Diabetes Mother    Social History:  History  Alcohol Use No     History  Drug Use  . Types: Marijuana, Cocaine, Methamphetamines, "Crack" cocaine    Comment: 06/06/2017 "nothing in the last 5 years"    Social History   Social History  . Marital status: Legally Separated    Spouse name: N/A  . Number of children: 0  . Years of education: 69   Occupational History  . Disability    Social History Main Topics  . Smoking status: Current Every Day Smoker    Packs/day: 0.50    Years: 9.00    Types: Cigarettes  . Smokeless  tobacco: Never Used  . Alcohol use No  . Drug use: Yes    Types: Marijuana, Cocaine, Methamphetamines, "Crack" cocaine     Comment: 06/06/2017 "nothing in the last 5 years"  . Sexual activity: Not Currently   Other Topics Concern  . None   Social History Narrative   ** Merged History Encounter **    Fun: Video games      Additional Social History: He  reports that he has been smoking Cigarettes.  He has a 4.50 pack-year smoking history. He has never used smokeless tobacco. He reports that he uses drugs, including Marijuana, Cocaine, Methamphetamines, and "Crack" cocaine. He reports that he does not drink alcohol    Allergies:   Allergies  Allergen Reactions  . Sulfa Antibiotics Other (See Comments)    Unknown childhood allergy  . Fentanyl Other (See Comments)    Patient had hallucinations with the medication    Labs:  Results for orders placed or performed during the hospital encounter of 10/10/17 (from the past 48 hour(s))  CBG monitoring, ED     Status: Abnormal   Collection Time: 10/10/17 11:03 PM  Result Value Ref Range   Glucose-Capillary >600 (HH)  65 - 99 mg/dL   Comment 1 Notify RN    Comment 2 Document in Chart   Basic metabolic panel     Status: Abnormal   Collection Time: 10/10/17 11:24 PM  Result Value Ref Range   Sodium 132 (L) 135 - 145 mmol/L   Potassium 5.5 (H) 3.5 - 5.1 mmol/L   Chloride 96 (L) 101 - 111 mmol/L   CO2 <7 (L) 22 - 32 mmol/L   Glucose, Bld 788 (HH) 65 - 99 mg/dL    Comment: CRITICAL RESULT CALLED TO, READ BACK BY AND VERIFIED WITH: FERRAINOLO J,RN 10/11/17 0027 WAYK    BUN 25 (H) 6 - 20 mg/dL   Creatinine, Ser 2.20 (H) 0.61 - 1.24 mg/dL   Calcium 8.9 8.9 - 10.3 mg/dL   GFR calc non Af Amer 38 (L) >60 mL/min   GFR calc Af Amer 44 (L) >60 mL/min    Comment: (NOTE) The eGFR has been calculated using the CKD EPI equation. This calculation has not been validated in all clinical situations. eGFR's persistently <60 mL/min signify possible  Chronic Kidney Disease.   CBC     Status: Abnormal   Collection Time: 10/10/17 11:24 PM  Result Value Ref Range   WBC 28.6 (H) 4.0 - 10.5 K/uL   RBC 4.29 4.22 - 5.81 MIL/uL   Hemoglobin 12.8 (L) 13.0 - 17.0 g/dL   HCT 38.9 (L) 39.0 - 52.0 %   MCV 90.7 78.0 - 100.0 fL   MCH 29.8 26.0 - 34.0 pg   MCHC 32.9 30.0 - 36.0 g/dL   RDW 13.8 11.5 - 15.5 %   Platelets 422 (H) 150 - 400 K/uL  Blood culture (routine x 2)     Status: None (Preliminary result)   Collection Time: 10/11/17 12:04 AM  Result Value Ref Range   Specimen Description BLOOD RIGHT HAND    Special Requests      BOTTLES DRAWN AEROBIC ONLY Blood Culture adequate volume   Culture  Setup Time      GRAM POSITIVE COCCI IN CLUSTERS AEROBIC BOTTLE ONLY CRITICAL RESULT CALLED TO, READ BACK BY AND VERIFIED WITH: G ABBOTT,[name] AT 0500 10/12/17 BY T [place]    Culture GRAM POSITIVE COCCI    Report Status PENDING   Blood Culture ID Panel (Reflexed)     Status: Abnormal   Collection Time: 10/11/17 12:04 AM  Result Value Ref Range   Enterococcus species NOT DETECTED NOT DETECTED   Listeria monocytogenes NOT DETECTED NOT DETECTED   Staphylococcus species DETECTED (A) NOT DETECTED    Comment: Methicillin (oxacillin) resistant coagulase negative staphylococcus. Possible blood culture contaminant (unless isolated from more than one blood culture draw or clinical case suggests pathogenicity). No antibiotic treatment is indicated for blood  culture contaminants. CRITICAL RESULT CALLED TO, READ BACK BY AND VERIFIED WITH:  TO GABOTT([name]) BY TCLEVELAND 10/12/2017 AT 500AM    Staphylococcus aureus NOT DETECTED NOT DETECTED   Methicillin resistance DETECTED (A) NOT DETECTED    Comment: CRITICAL RESULT CALLED TO, READ BACK BY AND VERIFIED WITH:  TO GABOTT([name]) BY TCLEVELAND 10/12/2017 AT 500AM    Streptococcus species NOT DETECTED NOT DETECTED   Streptococcus agalactiae NOT DETECTED NOT DETECTED   Streptococcus pneumoniae NOT  DETECTED NOT DETECTED   Streptococcus pyogenes NOT DETECTED NOT DETECTED   Acinetobacter baumannii NOT DETECTED NOT DETECTED   Enterobacteriaceae species NOT DETECTED NOT DETECTED   Enterobacter cloacae complex NOT DETECTED NOT DETECTED   Escherichia coli NOT DETECTED NOT DETECTED  Klebsiella oxytoca NOT DETECTED NOT DETECTED   Klebsiella pneumoniae NOT DETECTED NOT DETECTED   Proteus species NOT DETECTED NOT DETECTED   Serratia marcescens NOT DETECTED NOT DETECTED   Haemophilus influenzae NOT DETECTED NOT DETECTED   Neisseria meningitidis NOT DETECTED NOT DETECTED   Pseudomonas aeruginosa NOT DETECTED NOT DETECTED   Candida albicans NOT DETECTED NOT DETECTED   Candida glabrata NOT DETECTED NOT DETECTED   Candida krusei NOT DETECTED NOT DETECTED   Candida parapsilosis NOT DETECTED NOT DETECTED   Candida tropicalis NOT DETECTED NOT DETECTED  I-Stat Venous Blood Gas, ED (order at [facility] and [facility] only)     Status: Abnormal   Collection Time: 10/11/17 12:10 AM  Result Value Ref Range   pH, Ven 6.897 (LL) 7.250 - 7.430   pCO2, Ven 20.7 (L) 44.0 - 60.0 mmHg   pO2, Ven 41.0 32.0 - 45.0 mmHg   Bicarbonate 4.0 (L) 20.0 - 28.0 mmol/L   TCO2 <5 (L) 22 - 32 mmol/L   O2 Saturation 45.0 %   Acid-base deficit 28.0 (H) 0.0 - 2.0 mmol/L   Patient temperature HIDE    Sample type VENOUS    Comment NOTIFIED PHYSICIAN   I-Stat Chem 8, ED     Status: Abnormal   Collection Time: 10/11/17 12:10 AM  Result Value Ref Range   Sodium 131 (L) 135 - 145 mmol/L   Potassium 5.3 (H) 3.5 - 5.1 mmol/L   Chloride 103 101 - 111 mmol/L   BUN 29 (H) 6 - 20 mg/dL   Creatinine, Ser 1.20 0.61 - 1.24 mg/dL   Glucose, Bld >700 (HH) 65 - 99 mg/dL   Calcium, Ion 1.15 1.15 - 1.40 mmol/L   TCO2 7 (L) 22 - 32 mmol/L   Hemoglobin 14.6 13.0 - 17.0 g/dL   HCT 43.0 39.0 - 52.0 %   Comment NOTIFIED PHYSICIAN   I-Stat CG4 Lactic Acid, ED     Status: Abnormal   Collection Time: 10/11/17 12:10 AM  Result Value Ref Range   Lactic  Acid, Venous 3.43 (HH) 0.5 - 1.9 mmol/L   Comment NOTIFIED PHYSICIAN   CBG monitoring, ED     Status: Abnormal   Collection Time: 10/11/17 12:57 AM  Result Value Ref Range   Glucose-Capillary >600 (HH) 65 - 99 mg/dL  Basic metabolic panel     Status: Abnormal   Collection Time: 10/11/17  1:00 AM  Result Value Ref Range   Sodium 132 (L) 135 - 145 mmol/L   Potassium 5.3 (H) 3.5 - 5.1 mmol/L   Chloride 92 (L) 101 - 111 mmol/L   CO2 <7 (L) 22 - 32 mmol/L   Glucose, Bld 819 (HH) 65 - 99 mg/dL    Comment: CRITICAL RESULT CALLED TO, READ BACK BY AND VERIFIED WITH: FERRAINOLO J,RN 10/11/17 0145 WAYK    BUN 25 (H) 6 - 20 mg/dL   Creatinine, Ser 2.38 (H) 0.61 - 1.24 mg/dL   Calcium 8.9 8.9 - 10.3 mg/dL   GFR calc non Af Amer 34 (L) >60 mL/min   GFR calc Af Amer 40 (L) >60 mL/min    Comment: (NOTE) The eGFR has been calculated using the CKD EPI equation. This calculation has not been validated in all clinical situations. eGFR's persistently <60 mL/min signify possible Chronic Kidney Disease.   Beta-hydroxybutyric acid     Status: Abnormal   Collection Time: 10/11/17  1:00 AM  Result Value Ref Range   Beta-Hydroxybutyric Acid >8.00 (H) 0.05 - 0.27 mmol/L  Comment: RESULTS CONFIRMED BY MANUAL DILUTION  Magnesium     Status: Abnormal   Collection Time: 10/11/17  1:00 AM  Result Value Ref Range   Magnesium 2.5 (H) 1.7 - 2.4 mg/dL  Phosphorus     Status: Abnormal   Collection Time: 10/11/17  1:00 AM  Result Value Ref Range   Phosphorus 9.2 (H) 2.5 - 4.6 mg/dL  Hemoglobin A1c     Status: Abnormal   Collection Time: 10/11/17  1:00 AM  Result Value Ref Range   Hgb A1c MFr Bld 7.8 (H) 4.8 - 5.6 %    Comment: (NOTE) Pre diabetes:          5.7%-6.4% Diabetes:              >6.4% Glycemic control for   <7.0% adults with diabetes    Mean Plasma Glucose 177.16 mg/dL  Ethanol     Status: None   Collection Time: 10/11/17  1:00 AM  Result Value Ref Range   Alcohol, Ethyl (B) <10 <10 mg/dL     Comment:        LOWEST DETECTABLE LIMIT FOR SERUM ALCOHOL IS 10 mg/dL FOR MEDICAL PURPOSES ONLY   Procalcitonin - Baseline     Status: None   Collection Time: 10/11/17  1:00 AM  Result Value Ref Range   Procalcitonin 0.18 ng/mL    Comment:        Interpretation: PCT (Procalcitonin) <= 0.5 ng/mL: Systemic infection (sepsis) is not likely. Local bacterial infection is possible. (NOTE)         ICU PCT Algorithm               Non ICU PCT Algorithm    ----------------------------     ------------------------------         PCT < 0.25 ng/mL                 PCT < 0.1 ng/mL     Stopping of antibiotics            Stopping of antibiotics       strongly encouraged.               strongly encouraged.    ----------------------------     ------------------------------       PCT level decrease by               PCT < 0.25 ng/mL       >= 80% from peak PCT       OR PCT 0.25 - 0.5 ng/mL          Stopping of antibiotics                                             encouraged.     Stopping of antibiotics           encouraged.    ----------------------------     ------------------------------       PCT level decrease by              PCT >= 0.25 ng/mL       < 80% from peak PCT        AND PCT >= 0.5 ng/mL            Continuin g antibiotics  encouraged.       Continuing antibiotics            encouraged.    ----------------------------     ------------------------------     PCT level increase compared          PCT > 0.5 ng/mL         with peak PCT AND          PCT >= 0.5 ng/mL             Escalation of antibiotics                                          strongly encouraged.      Escalation of antibiotics        strongly encouraged.   Urinalysis, Routine w reflex microscopic     Status: Abnormal   Collection Time: 10/11/17  1:30 AM  Result Value Ref Range   Color, Urine YELLOW YELLOW   APPearance CLEAR CLEAR   Specific Gravity, Urine 1.017 1.005 -  1.030   pH 5.0 5.0 - 8.0   Glucose, UA >=500 (A) NEGATIVE mg/dL   Hgb urine dipstick MODERATE (A) NEGATIVE   Bilirubin Urine NEGATIVE NEGATIVE   Ketones, ur 80 (A) NEGATIVE mg/dL   Protein, ur NEGATIVE NEGATIVE mg/dL   Nitrite NEGATIVE NEGATIVE   Leukocytes, UA NEGATIVE NEGATIVE   RBC / HPF 0-5 0 - 5 RBC/hpf   WBC, UA 0-5 0 - 5 WBC/hpf   Bacteria, UA NONE SEEN NONE SEEN   Squamous Epithelial / LPF 0-5 (A) NONE SEEN   Mucus PRESENT    Hyaline Casts, UA PRESENT   Urine rapid drug screen (hosp performed)     Status: Abnormal   Collection Time: 10/11/17  1:30 AM  Result Value Ref Range   Opiates NONE DETECTED NONE DETECTED   Cocaine NONE DETECTED NONE DETECTED   Benzodiazepines NONE DETECTED NONE DETECTED   Amphetamines POSITIVE (A) NONE DETECTED   Tetrahydrocannabinol NONE DETECTED NONE DETECTED   Barbiturates NONE DETECTED NONE DETECTED    Comment:        DRUG SCREEN FOR MEDICAL PURPOSES ONLY.  IF CONFIRMATION IS NEEDED FOR ANY PURPOSE, NOTIFY LAB WITHIN 5 DAYS.        LOWEST DETECTABLE LIMITS FOR URINE DRUG SCREEN Drug Class       Cutoff (ng/mL) Amphetamine      1000 Barbiturate      200 Benzodiazepine   753 Tricyclics       005 Opiates          300 Cocaine          300 THC              50   CBG monitoring, ED     Status: Abnormal   Collection Time: 10/11/17  2:01 AM  Result Value Ref Range   Glucose-Capillary >600 (HH) 65 - 99 mg/dL   Comment 1 Notify RN    Comment 2 Document in Chart   Basic metabolic panel     Status: Abnormal   Collection Time: 10/11/17  3:18 AM  Result Value Ref Range   Sodium 141 135 - 145 mmol/L    Comment: DELTA CHECK NOTED   Potassium 3.7 3.5 - 5.1 mmol/L    Comment: DELTA CHECK NOTED   Chloride 112 (H) 101 - 111 mmol/L   CO2  7 (L) 22 - 32 mmol/L   Glucose, Bld 426 (H) 65 - 99 mg/dL   BUN 21 (H) 6 - 20 mg/dL   Creatinine, Ser 1.78 (H) 0.61 - 1.24 mg/dL   Calcium 6.6 (L) 8.9 - 10.3 mg/dL    Comment: DELTA CHECK NOTED   GFR calc non  Af Amer 49 (L) >60 mL/min   GFR calc Af Amer 57 (L) >60 mL/min    Comment: (NOTE) The eGFR has been calculated using the CKD EPI equation. This calculation has not been validated in all clinical situations. eGFR's persistently <60 mL/min signify possible Chronic Kidney Disease.    Anion gap 22 (H) 5 - 15  Lactic acid, plasma     Status: Abnormal   Collection Time: 10/11/17  3:18 AM  Result Value Ref Range   Lactic Acid, Venous 2.6 (HH) 0.5 - 1.9 mmol/L    Comment: CRITICAL RESULT CALLED TO, READ BACK BY AND VERIFIED WITH: OACERNA A,RN 10/11/17 0409 WAYK   Glucose, capillary     Status: Abnormal   Collection Time: 10/11/17  3:19 AM  Result Value Ref Range   Glucose-Capillary 392 (H) 65 - 99 mg/dL   Comment 1 Capillary Specimen   MRSA PCR Screening     Status: None   Collection Time: 10/11/17  3:31 AM  Result Value Ref Range   MRSA by PCR NEGATIVE NEGATIVE    Comment:        The GeneXpert MRSA Assay (FDA approved for NASAL specimens only), is one component of a comprehensive MRSA colonization surveillance program. It is not intended to diagnose MRSA infection nor to guide or monitor treatment for MRSA infections.   Blood gas, arterial     Status: Abnormal   Collection Time: 10/11/17  3:55 AM  Result Value Ref Range   FIO2 21.00    pH, Arterial 7.157 (LL) 7.350 - 7.450    Comment: CRITICAL RESULT CALLED TO, READ BACK BY AND VERIFIED WITH: BRANDY WOODSAN RN AT [place] RRT, RCP ON 10/11/2017    pCO2 arterial BELOW REPORTABLE RANGE 32.0 - 48.0 mmHg    Comment: BRANDY WOODSAN, RN @ 0404 BY KARSEN AGEE,RRT ON 10/10/17   pO2, Arterial 130 (H) 83.0 - 108.0 mmHg   Bicarbonate 4.7 (L) 20.0 - 28.0 mmol/L   Acid-base deficit 22.9 (H) 0.0 - 2.0 mmol/L   O2 Saturation 98.6 %   Patient temperature 98.6    Collection site LEFT RADIAL    Drawn by [phone]    Sample type ARTERIAL DRAW    Allens test (pass/fail) PASS PASS  Glucose, capillary     Status: Abnormal    Collection Time: 10/11/17  4:19 AM  Result Value Ref Range   Glucose-Capillary 348 (H) 65 - 99 mg/dL   Comment 1 Capillary Specimen   Glucose, capillary     Status: Abnormal   Collection Time: 10/11/17  5:24 AM  Result Value Ref Range   Glucose-Capillary 236 (H) 65 - 99 mg/dL   Comment 1 Capillary Specimen   CBC     Status: Abnormal   Collection Time: 10/11/17  5:35 AM  Result Value Ref Range   WBC 18.6 (H) 4.0 - 10.5 K/uL   RBC 3.17 (L) 4.22 - 5.81 MIL/uL   Hemoglobin 9.2 (L) 13.0 - 17.0 g/dL    Comment: REPEATED TO VERIFY DELTA CHECK NOTED RESULT CALLED TO, READ BACK BY AND VERIFIED WITH: A. LACERNA,RN 1157 10/11/2017 T. TYSOR    HCT 26.9 (  L) 39.0 - 52.0 %   MCV 84.9 78.0 - 100.0 fL   MCH 29.0 26.0 - 34.0 pg   MCHC 34.2 30.0 - 36.0 g/dL   RDW 13.3 11.5 - 15.5 %   Platelets 302 150 - 400 K/uL  Glucose, capillary     Status: Abnormal   Collection Time: 10/11/17  6:42 AM  Result Value Ref Range   Glucose-Capillary 163 (H) 65 - 99 mg/dL   Comment 1 Capillary Specimen   Basic metabolic panel     Status: Abnormal   Collection Time: 10/11/17  7:31 AM  Result Value Ref Range   Sodium 141 135 - 145 mmol/L   Potassium 3.1 (L) 3.5 - 5.1 mmol/L   Chloride 114 (H) 101 - 111 mmol/L   CO2 14 (L) 22 - 32 mmol/L   Glucose, Bld 123 (H) 65 - 99 mg/dL   BUN 17 6 - 20 mg/dL   Creatinine, Ser 1.39 (H) 0.61 - 1.24 mg/dL   Calcium 7.5 (L) 8.9 - 10.3 mg/dL   GFR calc non Af Amer >60 >60 mL/min   GFR calc Af Amer >60 >60 mL/min    Comment: (NOTE) The eGFR has been calculated using the CKD EPI equation. This calculation has not been validated in all clinical situations. eGFR's persistently <60 mL/min signify possible Chronic Kidney Disease.    Anion gap 13 5 - 15  Lactic acid, plasma     Status: None   Collection Time: 10/11/17  7:31 AM  Result Value Ref Range   Lactic Acid, Venous 1.4 0.5 - 1.9 mmol/L  CBC with Differential/Platelet     Status: Abnormal   Collection Time: 10/11/17   7:31 AM  Result Value Ref Range   WBC 17.9 (H) 4.0 - 10.5 K/uL   RBC 3.17 (L) 4.22 - 5.81 MIL/uL   Hemoglobin 9.2 (L) 13.0 - 17.0 g/dL   HCT 26.7 (L) 39.0 - 52.0 %   MCV 84.2 78.0 - 100.0 fL   MCH 29.0 26.0 - 34.0 pg   MCHC 34.5 30.0 - 36.0 g/dL   RDW 13.4 11.5 - 15.5 %   Platelets 294 150 - 400 K/uL   Neutrophils Relative % 86 %   Neutro Abs 15.4 (H) 1.7 - 7.7 K/uL   Lymphocytes Relative 7 %   Lymphs Abs 1.2 0.7 - 4.0 K/uL   Monocytes Relative 7 %   Monocytes Absolute 1.2 (H) 0.1 - 1.0 K/uL   Eosinophils Relative 0 %   Eosinophils Absolute 0.0 0.0 - 0.7 K/uL   Basophils Relative 0 %   Basophils Absolute 0.0 0.0 - 0.1 K/uL  Glucose, capillary     Status: Abnormal   Collection Time: 10/11/17  7:34 AM  Result Value Ref Range   Glucose-Capillary 116 (H) 65 - 99 mg/dL  Glucose, capillary     Status: Abnormal   Collection Time: 10/11/17  8:07 AM  Result Value Ref Range   Glucose-Capillary 103 (H) 65 - 99 mg/dL   Comment 1 Capillary Specimen    Comment 2 Notify RN   Glucose, capillary     Status: None   Collection Time: 10/11/17 10:11 AM  Result Value Ref Range   Glucose-Capillary 86 65 - 99 mg/dL  Basic metabolic panel     Status: Abnormal   Collection Time: 10/11/17 11:05 AM  Result Value Ref Range   Sodium 139 135 - 145 mmol/L   Potassium 3.5 3.5 - 5.1 mmol/L   Chloride 115 (H) 101 -  111 mmol/L   CO2 17 (L) 22 - 32 mmol/L   Glucose, Bld 102 (H) 65 - 99 mg/dL   BUN 14 6 - 20 mg/dL   Creatinine, Ser 1.22 0.61 - 1.24 mg/dL   Calcium 7.7 (L) 8.9 - 10.3 mg/dL   GFR calc non Af Amer >60 >60 mL/min   GFR calc Af Amer >60 >60 mL/min    Comment: (NOTE) The eGFR has been calculated using the CKD EPI equation. This calculation has not been validated in all clinical situations. eGFR's persistently <60 mL/min signify possible Chronic Kidney Disease.    Anion gap 7 5 - 15  Glucose, capillary     Status: Abnormal   Collection Time: 10/11/17 11:07 AM  Result Value Ref Range    Glucose-Capillary 103 (H) 65 - 99 mg/dL  Glucose, capillary     Status: Abnormal   Collection Time: 10/11/17 12:14 PM  Result Value Ref Range   Glucose-Capillary 107 (H) 65 - 99 mg/dL  Glucose, capillary     Status: Abnormal   Collection Time: 10/11/17 12:16 PM  Result Value Ref Range   Glucose-Capillary 107 (H) 65 - 99 mg/dL  Glucose, capillary     Status: Abnormal   Collection Time: 10/11/17  1:19 PM  Result Value Ref Range   Glucose-Capillary 120 (H) 65 - 99 mg/dL   Comment 1 Capillary Specimen   Glucose, capillary     Status: Abnormal   Collection Time: 10/11/17  2:28 PM  Result Value Ref Range   Glucose-Capillary 155 (H) 65 - 99 mg/dL   Comment 1 Capillary Specimen   Basic metabolic panel     Status: Abnormal   Collection Time: 10/11/17  3:05 PM  Result Value Ref Range   Sodium 139 135 - 145 mmol/L   Potassium 3.7 3.5 - 5.1 mmol/L   Chloride 113 (H) 101 - 111 mmol/L   CO2 12 (L) 22 - 32 mmol/L   Glucose, Bld 207 (H) 65 - 99 mg/dL   BUN 13 6 - 20 mg/dL   Creatinine, Ser 1.13 0.61 - 1.24 mg/dL   Calcium 7.9 (L) 8.9 - 10.3 mg/dL   GFR calc non Af Amer >60 >60 mL/min   GFR calc Af Amer >60 >60 mL/min    Comment: (NOTE) The eGFR has been calculated using the CKD EPI equation. This calculation has not been validated in all clinical situations. eGFR's persistently <60 mL/min signify possible Chronic Kidney Disease.    Anion gap 14 5 - 15  Glucose, capillary     Status: Abnormal   Collection Time: 10/11/17  3:56 PM  Result Value Ref Range   Glucose-Capillary 214 (H) 65 - 99 mg/dL   Comment 1 Notify RN   Glucose, capillary     Status: Abnormal   Collection Time: 10/11/17  5:02 PM  Result Value Ref Range   Glucose-Capillary 252 (H) 65 - 99 mg/dL  Glucose, capillary     Status: Abnormal   Collection Time: 10/11/17  6:04 PM  Result Value Ref Range   Glucose-Capillary 271 (H) 65 - 99 mg/dL  Glucose, capillary     Status: Abnormal   Collection Time: 10/11/17  6:57 PM   Result Value Ref Range   Glucose-Capillary 226 (H) 65 - 99 mg/dL  Basic metabolic panel     Status: Abnormal   Collection Time: 10/11/17  7:01 PM  Result Value Ref Range   Sodium 139 135 - 145 mmol/L   Potassium 3.2 (L) 3.5 -  5.1 mmol/L   Chloride 114 (H) 101 - 111 mmol/L   CO2 15 (L) 22 - 32 mmol/L   Glucose, Bld 244 (H) 65 - 99 mg/dL   BUN 12 6 - 20 mg/dL   Creatinine, Ser 1.34 (H) 0.61 - 1.24 mg/dL   Calcium 8.0 (L) 8.9 - 10.3 mg/dL   GFR calc non Af Amer >60 >60 mL/min   GFR calc Af Amer >60 >60 mL/min    Comment: (NOTE) The eGFR has been calculated using the CKD EPI equation. This calculation has not been validated in all clinical situations. eGFR's persistently <60 mL/min signify possible Chronic Kidney Disease.    Anion gap 10 5 - 15  Glucose, capillary     Status: Abnormal   Collection Time: 10/11/17  8:07 PM  Result Value Ref Range   Glucose-Capillary 167 (H) 65 - 99 mg/dL   Comment 1 Notify RN   Glucose, capillary     Status: Abnormal   Collection Time: 10/11/17  9:10 PM  Result Value Ref Range   Glucose-Capillary 148 (H) 65 - 99 mg/dL   Comment 1 Notify RN   Glucose, capillary     Status: Abnormal   Collection Time: 10/11/17 10:11 PM  Result Value Ref Range   Glucose-Capillary 156 (H) 65 - 99 mg/dL   Comment 1 Notify RN   Glucose, capillary     Status: Abnormal   Collection Time: 10/11/17 11:07 PM  Result Value Ref Range   Glucose-Capillary 169 (H) 65 - 99 mg/dL   Comment 1 Notify RN   Basic metabolic panel     Status: Abnormal   Collection Time: 10/11/17 11:18 PM  Result Value Ref Range   Sodium 139 135 - 145 mmol/L   Potassium 3.7 3.5 - 5.1 mmol/L   Chloride 114 (H) 101 - 111 mmol/L   CO2 18 (L) 22 - 32 mmol/L   Glucose, Bld 190 (H) 65 - 99 mg/dL   BUN 10 6 - 20 mg/dL   Creatinine, Ser 1.09 0.61 - 1.24 mg/dL   Calcium 7.8 (L) 8.9 - 10.3 mg/dL   GFR calc non Af Amer >60 >60 mL/min   GFR calc Af Amer >60 >60 mL/min    Comment: (NOTE) The eGFR has  been calculated using the CKD EPI equation. This calculation has not been validated in all clinical situations. eGFR's persistently <60 mL/min signify possible Chronic Kidney Disease.    Anion gap 7 5 - 15  Procalcitonin     Status: None   Collection Time: 10/12/17  5:34 AM  Result Value Ref Range   Procalcitonin 0.46 ng/mL    Comment:        Interpretation: PCT (Procalcitonin) <= 0.5 ng/mL: Systemic infection (sepsis) is not likely. Local bacterial infection is possible. (NOTE)         ICU PCT Algorithm               Non ICU PCT Algorithm    ----------------------------     ------------------------------         PCT < 0.25 ng/mL                 PCT < 0.1 ng/mL     Stopping of antibiotics            Stopping of antibiotics       strongly encouraged.               strongly encouraged.    ----------------------------     ------------------------------  PCT level decrease by               PCT < 0.25 ng/mL       >= 80% from peak PCT       OR PCT 0.25 - 0.5 ng/mL          Stopping of antibiotics                                             encouraged.     Stopping of antibiotics           encouraged.    ----------------------------     ------------------------------       PCT level decrease by              PCT >= 0.25 ng/mL       < 80% from peak PCT        AND PCT >= 0.5 ng/mL            Continuin g antibiotics                                              encouraged.       Continuing antibiotics            encouraged.    ----------------------------     ------------------------------     PCT level increase compared          PCT > 0.5 ng/mL         with peak PCT AND          PCT >= 0.5 ng/mL             Escalation of antibiotics                                          strongly encouraged.      Escalation of antibiotics        strongly encouraged.   CBC with Differential/Platelet     Status: Abnormal   Collection Time: 10/12/17  5:34 AM  Result Value Ref Range   WBC 8.3  4.0 - 10.5 K/uL   RBC 3.01 (L) 4.22 - 5.81 MIL/uL   Hemoglobin 8.7 (L) 13.0 - 17.0 g/dL   HCT 25.4 (L) 39.0 - 52.0 %   MCV 84.4 78.0 - 100.0 fL   MCH 28.9 26.0 - 34.0 pg   MCHC 34.3 30.0 - 36.0 g/dL   RDW 14.0 11.5 - 15.5 %   Platelets 236 150 - 400 K/uL   Neutrophils Relative % 77 %   Neutro Abs 6.4 1.7 - 7.7 K/uL   Lymphocytes Relative 17 %   Lymphs Abs 1.4 0.7 - 4.0 K/uL   Monocytes Relative 5 %   Monocytes Absolute 0.4 0.1 - 1.0 K/uL   Eosinophils Relative 1 %   Eosinophils Absolute 0.1 0.0 - 0.7 K/uL   Basophils Relative 0 %   Basophils Absolute 0.0 0.0 - 0.1 K/uL  Magnesium     Status: Abnormal   Collection Time: 10/12/17  5:34 AM  Result Value Ref Range   Magnesium 1.4 (L) 1.7 - 2.4 mg/dL  Phosphorus  Status: Abnormal   Collection Time: 10/12/17  5:34 AM  Result Value Ref Range   Phosphorus 1.1 (L) 2.5 - 4.6 mg/dL  Basic metabolic panel     Status: Abnormal   Collection Time: 10/12/17  5:34 AM  Result Value Ref Range   Sodium 138 135 - 145 mmol/L   Potassium 3.2 (L) 3.5 - 5.1 mmol/L   Chloride 111 101 - 111 mmol/L   CO2 20 (L) 22 - 32 mmol/L   Glucose, Bld 246 (H) 65 - 99 mg/dL   BUN 7 6 - 20 mg/dL   Creatinine, Ser 0.92 0.61 - 1.24 mg/dL   Calcium 7.9 (L) 8.9 - 10.3 mg/dL   GFR calc non Af Amer >60 >60 mL/min   GFR calc Af Amer >60 >60 mL/min    Comment: (NOTE) The eGFR has been calculated using the CKD EPI equation. This calculation has not been validated in all clinical situations. eGFR's persistently <60 mL/min signify possible Chronic Kidney Disease.    Anion gap 7 5 - 15  Glucose, capillary     Status: Abnormal   Collection Time: 10/12/17  7:34 AM  Result Value Ref Range   Glucose-Capillary 264 (H) 65 - 99 mg/dL   Comment 1 Capillary Specimen    Comment 2 Notify RN     Current Facility-Administered Medications  Medication Dose Route Frequency Provider Last Rate Last Dose  . dextrose 5 %-0.45 % sodium chloride infusion   Intravenous Continuous  Omar Person, NP 125 mL/hr at 10/12/17 0207    . heparin injection 5,000 Units  5,000 Units Subcutaneous Q8H Omar Person, NP   5,000 Units at 10/12/17 0513  . insulin aspart (novoLOG) injection 0-15 Units  0-15 Units Subcutaneous TID AC & HS Deterding, Guadelupe Sabin, MD   8 Units at 10/12/17 [phone]  . insulin regular (NOVOLIN R,HUMULIN R) 100 Units in sodium chloride 0.9 % 100 mL (1 Units/mL) infusion   Intravenous Continuous Omar Person, NP   Stopped at 10/12/17 [phone]  . MEDLINE mouth rinse  15 mL Mouth Rinse BID Brand Males, MD   15 mL at 10/12/17 0843  . mupirocin cream (BACTROBAN) 2 %   Topical Daily Raylene Miyamoto, MD      . pantoprazole (PROTONIX) injection 40 mg  40 mg Intravenous Q24H Omar Person, NP   40 mg at 10/12/17 0057  . piperacillin-tazobactam (ZOSYN) IVPB 3.375 g  3.375 g Intravenous Q8H Laren Everts, RPH 12.5 mL/hr at 10/12/17 0835 3.375 g at 10/12/17 0835  . potassium chloride 20 MEQ/15ML (10%) solution 30 mEq  30 mEq Oral Q4H Edwin Dada, MD   30 mEq at 10/12/17 0832  . risperiDONE (RISPERDAL) tablet 0.5 mg  0.5 mg Oral BID Everrett Coombe, MD   0.5 mg at 10/11/17 2122  . vancomycin (VANCOCIN) IVPB 750 mg/150 ml premix  750 mg Intravenous Q12H Laren Everts, RPH   Stopped at 10/12/17 0012    Musculoskeletal: Strength & Muscle Tone: decreased Gait & Station: unable to stand Patient leans: N/A  Psychiatric Specialty Exam: Physical Exam as per history and physical  ROS patient has generalized weakness but denied nausea, vomiting, abdominal pain, shortness of breath and chest pain. No Fever-chills, No Headache, No changes with Vision or hearing, reports vertigo No problems swallowing food or Liquids, No Chest pain, Cough or Shortness of Breath, No Abdominal pain, No Nausea or Vommitting, Bowel movements are regular, No Blood in stool or Urine, No dysuria,  No new skin rashes or bruises, No new joints pains-aches,  No new  weakness, tingling, numbness in any extremity, No recent weight gain or loss, No polyuria, polydypsia or polyphagia,  A full 10 point Review of Systems was done, except as stated above, all other Review of Systems were negative.  Blood pressure 110/70, pulse (!) 111, temperature 99.3 F (37.4 C), resp. rate 15, height _0  (1.854 m), weight 67.7 kg (149 lb 4 oz), SpO2 100 %.Body mass index is 19.69 kg/m.  General Appearance: Casual  Eye Contact:  Good  Speech:  Clear and Coherent and Slow  Volume:  Decreased  Mood:  Euthymic  Affect:  Constricted and Depressed  Thought Process:  Coherent and Goal Directed  Orientation:  Full (Time, Place, and Person)  Thought Content:  Logical  Suicidal Thoughts:  No  Homicidal Thoughts:  No  Memory:  Immediate;   Fair Recent;   Fair Remote;   Fair  Judgement:  Impaired  Insight:  Fair  Psychomotor Activity:  Decreased  Concentration:  Concentration: Fair and Attention Span: Fair  Recall:  Good  Fund of Knowledge:  Good  Language:  Good  Akathisia:  Negative  Handed:  Right  AIMS (if indicated):     Assets:  Communication Skills Desire for Improvement Financial Resources/Insurance Leisure Time Resilience Social Support  ADL's:  Impaired  Cognition:  WNL  Sleep:        Treatment Plan Summary: 31 years old male with a diagnosis of diabetic ketoacidosis, acute encephalopathy and also has a history of undifferentiated schizophrenia, generalized anxiety disorder, depression and ADHD.  Patient has no symptoms of psychosis, mania, agitation or aggressive behaviors.  The patient is compliant with his medication management and cooperative with the primary treatment team.  She does not to this provider from his previous psychiatric consultation encounters and known chronic noncompliant with medication for mental illness and diabetes mellitus reportedly does not have enough financial support system.  Recommendation: Case discussed with Dr.  Loleta Books and informed that patient does not want to cooperate with the mental health assessment and treatment any further during this visit Patient has no safety concerns during my evaluation Recommended no psychotropic medication during this visit Patient will be referred to the outpatient medication management when medically stable  Appreciate psychiatric consultation and we sign off as of today Please contact [phone] or [phone] if needs further assistance   Disposition: Patient does not meet criteria for psychiatric inpatient admission. Supportive therapy provided about ongoing stressors.  Ambrose Finland, MD 10/12/2017 10:05 AM

## 2017-10-12 NOTE — Progress Notes (Signed)
PHARMACY - PHYSICIAN COMMUNICATION CRITICAL VALUE ALERT - BLOOD CULTURE IDENTIFICATION (BCID)  Results for orders placed or performed during the hospital encounter of 10/10/17  Blood Culture ID Panel (Reflexed) (Collected: 10/11/2017 12:04 AM)  Result Value Ref Range   Enterococcus species NOT DETECTED NOT DETECTED   Listeria monocytogenes NOT DETECTED NOT DETECTED   Staphylococcus species DETECTED (A) NOT DETECTED   Staphylococcus aureus NOT DETECTED NOT DETECTED   Methicillin resistance DETECTED (A) NOT DETECTED   Streptococcus species NOT DETECTED NOT DETECTED   Streptococcus agalactiae NOT DETECTED NOT DETECTED   Streptococcus pneumoniae NOT DETECTED NOT DETECTED   Streptococcus pyogenes NOT DETECTED NOT DETECTED   Acinetobacter baumannii NOT DETECTED NOT DETECTED   Enterobacteriaceae species NOT DETECTED NOT DETECTED   Enterobacter cloacae complex NOT DETECTED NOT DETECTED   Escherichia coli NOT DETECTED NOT DETECTED   Klebsiella oxytoca NOT DETECTED NOT DETECTED   Klebsiella pneumoniae NOT DETECTED NOT DETECTED   Proteus species NOT DETECTED NOT DETECTED   Serratia marcescens NOT DETECTED NOT DETECTED   Haemophilus influenzae NOT DETECTED NOT DETECTED   Neisseria meningitidis NOT DETECTED NOT DETECTED   Pseudomonas aeruginosa NOT DETECTED NOT DETECTED   Candida albicans NOT DETECTED NOT DETECTED   Candida glabrata NOT DETECTED NOT DETECTED   Candida krusei NOT DETECTED NOT DETECTED   Candida parapsilosis NOT DETECTED NOT DETECTED   Candida tropicalis NOT DETECTED NOT DETECTED    Name of physician (or Provider) Contacted:  N/A  Changes to prescribed antibiotics required:   MR-CoNS in 1/2 blood cultures.  Currently receiving Vancomycin and Zosyn for sepsis, possibly due to foot ulcer.    Eddie Candle 10/12/2017  5:11 AM

## 2017-10-12 NOTE — Care Management Note (Signed)
Case Management Note  Patient Details  Name: KEAHI MCCARNEY MRN: [id number] Date of Birth: [birth year]  Subjective/Objective:  Pt admitted with DKA                   Action/Plan:  PTA from home with roommate.  Pt states he has PCP and denied barriers with obtaining/paying for medications as prescribed.  Pt states he has all need DM supplies - denied barriers to managing DM.    Expected Discharge Date:                  Expected Discharge Plan:  Home/Self Care  In-House Referral:     Discharge planning Services  CM Consult  Post Acute Care Choice:    Choice offered to:     DME Arranged:    DME Agency:     HH Arranged:    HH Agency:     Status of Service:     If discussed at [employer] of Stay Meetings, dates discussed:    Additional Comments:  Cherylann Parr, RN 10/12/2017, 2:43 PM

## 2017-10-12 NOTE — Progress Notes (Signed)
Pt returned from procedure. VS stable

## 2017-10-12 NOTE — Progress Notes (Signed)
Pt transported from the unit for the procedure. VS stable

## 2017-10-13 DIAGNOSIS — F203 Undifferentiated schizophrenia: Secondary | ICD-10-CM

## 2017-10-13 DIAGNOSIS — R1310 Dysphagia, unspecified: Secondary | ICD-10-CM

## 2017-10-13 LAB — COMPREHENSIVE METABOLIC PANEL
ALBUMIN: 2.3 g/dL — AB (ref 3.5–5.0)
ALT: 10 U/L — AB (ref 17–63)
AST: 16 U/L (ref 15–41)
Alkaline Phosphatase: 80 U/L (ref 38–126)
Anion gap: 9 (ref 5–15)
CHLORIDE: 108 mmol/L (ref 101–111)
CO2: 25 mmol/L (ref 22–32)
CREATININE: 0.68 mg/dL (ref 0.61–1.24)
Calcium: 8 mg/dL — ABNORMAL LOW (ref 8.9–10.3)
GFR calc Af Amer: >60 mL/min (ref >60)
GFR calc non Af Amer: >60 mL/min (ref >60)
GLUCOSE: 105 mg/dL — AB (ref 65–99)
POTASSIUM: 3.4 mmol/L — AB (ref 3.5–5.1)
SODIUM: 142 mmol/L (ref 135–145)
Total Bilirubin: 0.2 mg/dL — ABNORMAL LOW (ref 0.3–1.2)
Total Protein: 4.1 g/dL — ABNORMAL LOW (ref 6.5–8.1)

## 2017-10-13 LAB — CBC
HEMATOCRIT: 26.5 % — AB (ref 39.0–52.0)
HEMOGLOBIN: 8.8 g/dL — AB (ref 13.0–17.0)
MCH: 28.3 pg (ref 26.0–34.0)
MCHC: 33.2 g/dL (ref 30.0–36.0)
MCV: 85.2 fL (ref 78.0–100.0)
Platelets: 252 10*3/uL (ref 150–400)
RBC: 3.11 MIL/uL — AB (ref 4.22–5.81)
RDW: 14 % (ref 11.5–15.5)
WBC: 5.5 10*3/uL (ref 4.0–10.5)

## 2017-10-13 LAB — MAGNESIUM: MAGNESIUM: 1.7 mg/dL (ref 1.7–2.4)

## 2017-10-13 LAB — GLUCOSE, CAPILLARY
GLUCOSE-CAPILLARY: 109 mg/dL — AB (ref 65–99)
GLUCOSE-CAPILLARY: 90 mg/dL (ref 65–99)

## 2017-10-13 LAB — PROCALCITONIN: PROCALCITONIN: 0.17 ng/mL

## 2017-10-13 LAB — CULTURE, BLOOD (ROUTINE X 2): Special Requests: ADEQUATE

## 2017-10-13 LAB — PHOSPHORUS: Phosphorus: 3.1 mg/dL (ref 2.5–4.6)

## 2017-10-13 MED ORDER — ACETAMINOPHEN 325 MG PO TABS
650.0000 mg | ORAL_TABLET | Freq: Four times a day (QID) | ORAL | Status: DC | PRN
  Administered 2017-10-13: 650 mg via ORAL
  Filled 2017-10-13: qty 2

## 2017-10-13 MED ORDER — PANTOPRAZOLE SODIUM 40 MG PO TBEC: 40.0000 mg | DELAYED_RELEASE_TABLET | Freq: Two times a day (BID) | ORAL | 1 refills | Status: DC

## 2017-10-13 MED ORDER — AZITHROMYCIN 250 MG PO TABS: 250.0000 mg | ORAL_TABLET | Freq: Every day | ORAL | 0 refills | Status: AC

## 2017-10-13 MED ORDER — DICLOFENAC POTASSIUM 50 MG PO TABS
50.0000 mg | ORAL_TABLET | Freq: Two times a day (BID) | ORAL | 2 refills | Status: DC | PRN
Start: 2017-10-13 — End: 2017-11-04

## 2017-10-13 NOTE — Progress Notes (Signed)
Pt for discharge, ready to go home.

## 2017-10-13 NOTE — Progress Notes (Signed)
Pt getting ready to go home.  Gave copy of DC instructions and explained.  Rx for Zithromax given and explained.  Pt has ride home.  Pt has understanding of follow up wound care appt.

## 2017-10-13 NOTE — Progress Notes (Signed)
Pt going home.  Called Pharmacy to see if still need the vanc trough.

## 2017-10-13 NOTE — Discharge Summary (Signed)
Physician Discharge Summary  [name] ZOX:[id number] DOB: 1986/08/23 DOA: 10/10/2017  PCP: Fleet ContrasAvbuere, Edwin, MD  Admit date: 10/10/2017 Discharge date: 10/13/2017  Admitted From: Home Disposition:  Home  Recommendations for Outpatient Follow-up:  1. Follow up with PCP in 1-2 weeks 2. Please consider discontinuing Adderall, which exacerbates his schizoaffective disorder 3. Please refer to Gastroenterology for follow up of his dysphagia  Home Health: No Equipment/Devices: None  Discharge Condition: Good  CODE STATUS: FULL Diet recommendation: Carb modified  Brief/Interim Summary: 31 yo M with schizoaffective disorder, T1DM and medical noncompliance and frequent admissions for DKA who presented obtunded in DKA and was admitted to the ICU.  He did not require intubation this time.  His gap corrected over about 24 hours with insulin and bicarb gtt and he was transitioned last night at midnight to subQ insulin and transferred to the hospitalist service.    DKA Treated to resolution with insulin drip, converted back to home dose of subQ insulin and stable for >24 hours. He reported dysphagia, leading to poor PO intake, leading to fear to tak any insulin as the precipitating factor for his episode.  He was seen by Diabetes Ed, who reinforced the importance of adhering to normal basal insulin despite no PO intake episodes.  He was also seen by SLP who felt his swallow appeared normal by bedside evaluation, and recommended barium esophagram, which was also completely normal.  Sepsis ruled out Initially started on broad spectrum antibiotics for sepsis syndrome.  Despite SIRS criteria and elevated lactic acid, it was believed that his clinical presentation was only due to DKA.  Blood cultures remained negative, urine was sterile, his left heel wound did not appear infected.  His CXR repeat on HD2 did show an infrahilar opacity, although this was asymptomatic, but he was discharged to  complete 5 days of antibiotics for possible CAP.  Dysphagia He describes difficulty swallowing bread or meat.  Barium esophagram was normal.  His PPI was titrated up for suspected esophagitis, and GI consultation as an outpatient was recommended.        Discharge Diagnoses:  Active Problems:   DKA (diabetic ketoacidoses) ([name])   Encephalopathy acute   Pressure injury of skin    Discharge Instructions  Discharge Instructions    Activity as tolerated - No restrictions    Complete by:  As directed    Diet Carb Modified    Complete by:  As directed    Discharge instructions    Complete by:  As directed    From Dr. Maryfrances Bunnellanford: Take the antibiotic azithromycin once daily for 3 more doses then stop.  For your swallowing: the imaging shows things are fine.  If this is all esophagus irritation from stomach acid: Ask Dr. Concepcion ElkAvbuere for a referral to a gastroenterologist.   In the meantime, increase your antacid medicine (rather than ranitidine/Zantac that we talked about, it would be more simple to increase your pantoprazole/Protonix to twice a day).  Take your insulin daily, including your long-acting, even when you aren't able to eat.     Allergies as of 10/13/2017      Reactions   Sulfa Antibiotics Other (See Comments)   Unknown childhood allergy   Fentanyl Other (See Comments)   Patient had hallucinations with the medication      Medication List    STOP taking these medications   amphetamine-dextroamphetamine 30 MG tablet Commonly known as:  ADDERALL     TAKE these medications   acetaminophen 500 MG  tablet Commonly known as:  TYLENOL Take 1 tablet (500 mg total) by mouth every 6 (six) hours as needed.   azithromycin 250 MG tablet Commonly known as:  ZITHROMAX Z-PAK Take 1 tablet (250 mg total) by mouth daily. Take 2 tablets (500 mg) on  Day 1,  followed by 1 tablet (250 mg) once daily on Days 2 through 5.   diclofenac 50 MG tablet Commonly known as:  CATAFLAM Take 1  tablet (50 mg total) by mouth 2 (two) times daily as needed. For right shoulder pain What changed:  how much to take   doxepin 10 MG/ML solution Commonly known as:  SINEQUAN Take 10 mLs by mouth at bedtime.   insulin aspart 100 UNIT/ML FlexPen Commonly known as:  NOVOLOG FLEXPEN Inject 0-15 Units into the skin See admin instructions. Check Blood Sugar 4 times per day > with meals and at bedtime CBG 70 - 120: 0 units CBG 121 - 150: 2 units CBG 151 - 200: 3 units CBG 201 - 250: 5 units CBG 251 - 300: 8 units CBG 301 - 350: 11 units CBG 351 - 400: 15 units   insulin detemir 100 UNIT/ML injection Commonly known as:  LEVEMIR Inject 0.3 mLs (30 Units total) into the skin daily.   metoCLOPramide 10 MG tablet Commonly known as:  REGLAN Take 1 tablet (10 mg total) by mouth 3 (three) times daily as needed for nausea or vomiting.   neomycin-bacitracin-polymyxin Oint Commonly known as:  NEOSPORIN Apply 1 application topically daily. Apply Neosporin to left heel daily, then cover with gauze and kerlex   pantoprazole 40 MG tablet Commonly known as:  PROTONIX Take 1 tablet (40 mg total) by mouth 2 (two) times daily before a meal. What changed:  when to take this   potassium chloride 10 MEQ tablet Commonly known as:  K-DUR Take 1 tablet (10 mEq total) by mouth daily.   risperiDONE 2 MG tablet Commonly known as:  RISPERDAL Take 2 mg by mouth at bedtime.   sennosides-docusate sodium 8.6-50 MG tablet Commonly known as:  SENOKOT-S Take 1 tablet by mouth as needed for constipation.   traZODone 150 MG tablet Commonly known as:  DESYREL Take 150 mg by mouth at bedtime.       Allergies  Allergen Reactions  . Sulfa Antibiotics Other (See Comments)    Unknown childhood allergy  . Fentanyl Other (See Comments)    Patient had hallucinations with the medication    Consultations:  CCM, Psychiatry   Procedures/Studies: Dg Esophagus  Result Date: 10/12/2017 CLINICAL DATA:  Dysphagia.   Difficulty swallowing. EXAM: ESOPHOGRAM/BARIUM SWALLOW TECHNIQUE: Single contrast examination was performed using  thin barium. FLUOROSCOPY TIME:  Fluoroscopy Time:  1 minutes 18 seconds Radiation Exposure Index (if provided by the fluoroscopic device): Number of Acquired Spot Images: 0 COMPARISON:  None. FINDINGS: Esophageal mucosa and motility normal. No stricture or mass lesion. Negative for hiatal hernia or reflux Barium tablet passed readily into the stomach without delay. IMPRESSION: Negative esophagram. Electronically Signed   By: Marlan Palau M.D.   On: 10/12/2017 13:57   Dg Chest Port 1 View  Result Date: 10/11/2017 CLINICAL DATA:  31 year old male with diabetic ketoacidosis. EXAM: PORTABLE CHEST 1 VIEW COMPARISON:  Chest radiograph dated 10/11/2017 FINDINGS: Left IJ central venous line with tip over the central SVC close to the cavoatrial junction. Mild prominence of the hilar vasculature. Small left infrahilar hazy density may represent atelectatic changes or crowding of the prominent vasculature although infiltrate  is not excluded. Clinical correlation is recommended. PA and lateral views of the chest may provide better evaluation. There is no pleural effusion or pneumothorax. The cardiac silhouette is within normal limits. No acute osseous pathology. IMPRESSION: 1. Left IJ central venous catheter with tip close to the cavoatrial junction. No pneumothorax. 2. Patchy area of hazy density in the left infrahilar region may represent atelectasis or vascular crowding. Infiltrate not excluded. Clinical correlation is recommended. PA and lateral views of the chest may provide better evaluation. Electronically Signed   By: Elgie Collard M.D.   On: 10/11/2017 03:52   Dg Chest Port 1 View  Result Date: 10/11/2017 CLINICAL DATA:  31 year old male with sepsis. EXAM: PORTABLE CHEST 1 VIEW COMPARISON:  Chest radiograph dated 09/14/2017 FINDINGS: The heart size and mediastinal contours are within normal  limits. Both lungs are clear. The visualized skeletal structures are unremarkable. IMPRESSION: No active disease. Electronically Signed   By: Elgie Collard M.D.   On: 10/11/2017 01:16   Dg Chest Port 1 View  Result Date: 09/14/2017 CLINICAL DATA:  Chest pain EXAM: PORTABLE CHEST 1 VIEW COMPARISON:  09/07/2017 FINDINGS: Heart and mediastinal contours are within normal limits. No focal opacities or effusions. No acute bony abnormality. IMPRESSION: No active disease. Electronically Signed   By: Charlett Nose M.D.   On: 09/14/2017 12:05       Subjective: Alert oriented, feels at his baseline.  No complaints, other than chronic shoulder pain for which he follows with Orthopedics and his dysphagia, addressed above.  Discharge Exam: Vitals:   10/13/17 0456 10/13/17 1305  BP: (!) 138/94 135/88  Pulse: (!) 108 95  Resp: 20 18  Temp: 98.6 F (37 C)   SpO2: 99% 100%   Vitals:   10/12/17 2136 10/13/17 0251 10/13/17 0456 10/13/17 1305  BP: 138/79 122/76 (!) 138/94 135/88  Pulse: (!) 112 (!) 113 (!) 108 95  Resp: 18 19 20 18   Temp: 98.9 F (37.2 C) 99.4 F (37.4 C) 98.6 F (37 C)   TempSrc: Oral Oral Oral Oral  SpO2: 100% 100% 99% 100%  Weight:      Height:        General: Very thin.  Pt is alert, awake, not in acute distress Cardiovascular: RRR, S1/S2 +, no rubs, no gallops Respiratory: CTA bilaterally, no wheezing, no rhonchi Abdominal: Soft, NT, ND, bowel sounds + Extremities: no edema, no cyanosis, heel wound without surrounding redness or erythema    The results of significant diagnostics from this hospitalization (including imaging, microbiology, ancillary and laboratory) are listed below for reference.     Microbiology: Recent Results (from the past 240 hour(s))  Blood culture (routine x 2)     Status: Abnormal   Collection Time: 10/11/17 12:04 AM  Result Value Ref Range Status   Specimen Description BLOOD RIGHT HAND  Final   Special Requests   Final    BOTTLES  DRAWN AEROBIC ONLY Blood Culture adequate volume   Culture  Setup Time   Final    GRAM POSITIVE COCCI IN CLUSTERS AEROBIC BOTTLE ONLY CRITICAL RESULT CALLED TO, READ BACK BY AND VERIFIED WITH: G ABBOTT,[name] AT 0500 10/12/17 BY T [place]    Culture (A)  Final    STAPHYLOCOCCUS SPECIES (COAGULASE NEGATIVE) THE SIGNIFICANCE OF ISOLATING THIS ORGANISM FROM A SINGLE SET OF BLOOD CULTURES WHEN MULTIPLE SETS ARE DRAWN IS UNCERTAIN. PLEASE NOTIFY THE MICROBIOLOGY DEPARTMENT WITHIN ONE WEEK IF SPECIATION AND SENSITIVITIES ARE REQUIRED.    Report Status 10/13/2017 FINAL  Final  Blood Culture ID Panel (Reflexed)     Status: Abnormal   Collection Time: 10/11/17 12:04 AM  Result Value Ref Range Status   Enterococcus species NOT DETECTED NOT DETECTED Final   Listeria monocytogenes NOT DETECTED NOT DETECTED Final   Staphylococcus species DETECTED (A) NOT DETECTED Final    Comment: Methicillin (oxacillin) resistant coagulase negative staphylococcus. Possible blood culture contaminant (unless isolated from more than one blood culture draw or clinical case suggests pathogenicity). No antibiotic treatment is indicated for blood  culture contaminants. CRITICAL RESULT CALLED TO, READ BACK BY AND VERIFIED WITH:  TO GABOTT([name]) BY TCLEVELAND 10/12/2017 AT 500AM    Staphylococcus aureus NOT DETECTED NOT DETECTED Final   Methicillin resistance DETECTED (A) NOT DETECTED Final    Comment: CRITICAL RESULT CALLED TO, READ BACK BY AND VERIFIED WITH:  TO GABOTT([name]) BY TCLEVELAND 10/12/2017 AT 500AM    Streptococcus species NOT DETECTED NOT DETECTED Final   Streptococcus agalactiae NOT DETECTED NOT DETECTED Final   Streptococcus pneumoniae NOT DETECTED NOT DETECTED Final   Streptococcus pyogenes NOT DETECTED NOT DETECTED Final   Acinetobacter baumannii NOT DETECTED NOT DETECTED Final   Enterobacteriaceae species NOT DETECTED NOT DETECTED Final   Enterobacter cloacae complex NOT DETECTED NOT DETECTED  Final   Escherichia coli NOT DETECTED NOT DETECTED Final   Klebsiella oxytoca NOT DETECTED NOT DETECTED Final   Klebsiella pneumoniae NOT DETECTED NOT DETECTED Final   Proteus species NOT DETECTED NOT DETECTED Final   Serratia marcescens NOT DETECTED NOT DETECTED Final   Haemophilus influenzae NOT DETECTED NOT DETECTED Final   Neisseria meningitidis NOT DETECTED NOT DETECTED Final   Pseudomonas aeruginosa NOT DETECTED NOT DETECTED Final   Candida albicans NOT DETECTED NOT DETECTED Final   Candida glabrata NOT DETECTED NOT DETECTED Final   Candida krusei NOT DETECTED NOT DETECTED Final   Candida parapsilosis NOT DETECTED NOT DETECTED Final   Candida tropicalis NOT DETECTED NOT DETECTED Final  Blood culture (routine x 2)     Status: None (Preliminary result)   Collection Time: 10/11/17  3:18 AM  Result Value Ref Range Status   Specimen Description BLOOD SPECIMEN SOURCE NOT MARKED ON REQUISITION  Final   Special Requests   Final    BOTTLES DRAWN AEROBIC AND ANAEROBIC Blood Culture adequate volume   Culture NO GROWTH 2 DAYS  Final   Report Status PENDING  Incomplete  MRSA PCR Screening     Status: None   Collection Time: 10/11/17  3:31 AM  Result Value Ref Range Status   MRSA by PCR NEGATIVE NEGATIVE Final    Comment:        The GeneXpert MRSA Assay (FDA approved for NASAL specimens only), is one component of a comprehensive MRSA colonization surveillance program. It is not intended to diagnose MRSA infection nor to guide or monitor treatment for MRSA infections.      Labs: BNP (last 3 results) No results for input(s): BNP in the last 8760 hours. Basic Metabolic Panel:  Recent Labs Lab 10/11/17 0100  10/11/17 1901 10/11/17 2318 10/12/17 0534 10/12/17 0949 10/13/17 0545  NA 132*  < > 139 139 138 138 142  K 5.3*  < > 3.2* 3.7 3.2* 3.9 3.4*  CL 92*  < > 114* 114* 111 112* 108  CO2 <7*  < > 15* 18* 20* 18* 25  GLUCOSE 819*  < > 244* 190* 246* 232* 105*  BUN 25*  < >  12 10 7  5* <5*  CREATININE 2.38*  < >  1.34* 1.09 0.92 1.02 0.68  CALCIUM 8.9  < > 8.0* 7.8* 7.9* 8.1* 8.0*  MG 2.5*  --   --   --  1.4*  --  1.7  PHOS 9.2*  --   --   --  1.1*  --  3.1  < > = values in this interval not displayed. Liver Function Tests:  Recent Labs Lab 10/13/17 0545  AST 16  ALT 10*  ALKPHOS 80  BILITOT 0.2*  PROT 4.1*  ALBUMIN 2.3*   No results for input(s): LIPASE, AMYLASE in the last 168 hours. No results for input(s): AMMONIA in the last 168 hours. CBC:  Recent Labs Lab 10/10/17 2324 10/11/17 0010 10/11/17 0535 10/11/17 0731 10/12/17 0534 10/13/17 0545  WBC 28.6*  --  18.6* 17.9* 8.3 5.5  NEUTROABS  --   --   --  15.4* 6.4  --   HGB 12.8* 14.6 9.2* 9.2* 8.7* 8.8*  HCT 38.9* 43.0 26.9* 26.7* 25.4* 26.5*  MCV 90.7  --  84.9 84.2 84.4 85.2  PLT 422*  --  302 294 236 252   Cardiac Enzymes: No results for input(s): CKTOTAL, CKMB, CKMBINDEX, TROPONINI in the last 168 hours. BNP: Invalid input(s): POCBNP CBG:  Recent Labs Lab 10/12/17 1511 10/12/17 1700 10/12/17 2105 10/13/17 0732 10/13/17 1257  GLUCAP 171* 138* 228* 109* 90   D-Dimer No results for input(s): DDIMER in the last 72 hours. Hgb A1c  Recent Labs  10/11/17 0100  HGBA1C 7.8*   Lipid Profile No results for input(s): CHOL, HDL, LDLCALC, TRIG, CHOLHDL, LDLDIRECT in the last 72 hours. Thyroid function studies No results for input(s): TSH, T4TOTAL, T3FREE, THYROIDAB in the last 72 hours.  Invalid input(s): FREET3 Anemia work up No results for input(s): VITAMINB12, FOLATE, FERRITIN, TIBC, IRON, RETICCTPCT in the last 72 hours. Urinalysis    Component Value Date/Time   COLORURINE YELLOW 10/11/2017 0130   APPEARANCEUR CLEAR 10/11/2017 0130   LABSPEC 1.017 10/11/2017 0130   PHURINE 5.0 10/11/2017 0130   GLUCOSEU >=500 (A) 10/11/2017 0130   HGBUR MODERATE (A) 10/11/2017 0130   BILIRUBINUR NEGATIVE 10/11/2017 0130   BILIRUBINUR negative 03/29/2015 1114   KETONESUR 80 (A)  10/11/2017 0130   PROTEINUR NEGATIVE 10/11/2017 0130   UROBILINOGEN 0.2 05/17/2015 1810   NITRITE NEGATIVE 10/11/2017 0130   LEUKOCYTESUR NEGATIVE 10/11/2017 0130   Sepsis Labs Invalid input(s): PROCALCITONIN,  WBC,  LACTICIDVEN Microbiology Recent Results (from the past 240 hour(s))  Blood culture (routine x 2)     Status: Abnormal   Collection Time: 10/11/17 12:04 AM  Result Value Ref Range Status   Specimen Description BLOOD RIGHT HAND  Final   Special Requests   Final    BOTTLES DRAWN AEROBIC ONLY Blood Culture adequate volume   Culture  Setup Time   Final    GRAM POSITIVE COCCI IN CLUSTERS AEROBIC BOTTLE ONLY CRITICAL RESULT CALLED TO, READ BACK BY AND VERIFIED WITH: G ABBOTT,[name] AT 0500 10/12/17 BY T [place]    Culture (A)  Final    STAPHYLOCOCCUS SPECIES (COAGULASE NEGATIVE) THE SIGNIFICANCE OF ISOLATING THIS ORGANISM FROM A SINGLE SET OF BLOOD CULTURES WHEN MULTIPLE SETS ARE DRAWN IS UNCERTAIN. PLEASE NOTIFY THE MICROBIOLOGY DEPARTMENT WITHIN ONE WEEK IF SPECIATION AND SENSITIVITIES ARE REQUIRED.    Report Status 10/13/2017 FINAL  Final  Blood Culture ID Panel (Reflexed)     Status: Abnormal   Collection Time: 10/11/17 12:04 AM  Result Value Ref Range Status   Enterococcus species NOT DETECTED NOT  DETECTED Final   Listeria monocytogenes NOT DETECTED NOT DETECTED Final   Staphylococcus species DETECTED (A) NOT DETECTED Final    Comment: Methicillin (oxacillin) resistant coagulase negative staphylococcus. Possible blood culture contaminant (unless isolated from more than one blood culture draw or clinical case suggests pathogenicity). No antibiotic treatment is indicated for blood  culture contaminants. CRITICAL RESULT CALLED TO, READ BACK BY AND VERIFIED WITH:  TO GABOTT([name]) BY TCLEVELAND 10/12/2017 AT 500AM    Staphylococcus aureus NOT DETECTED NOT DETECTED Final   Methicillin resistance DETECTED (A) NOT DETECTED Final    Comment: CRITICAL RESULT CALLED TO,  READ BACK BY AND VERIFIED WITH:  TO GABOTT([name]) BY TCLEVELAND 10/12/2017 AT 500AM    Streptococcus species NOT DETECTED NOT DETECTED Final   Streptococcus agalactiae NOT DETECTED NOT DETECTED Final   Streptococcus pneumoniae NOT DETECTED NOT DETECTED Final   Streptococcus pyogenes NOT DETECTED NOT DETECTED Final   Acinetobacter baumannii NOT DETECTED NOT DETECTED Final   Enterobacteriaceae species NOT DETECTED NOT DETECTED Final   Enterobacter cloacae complex NOT DETECTED NOT DETECTED Final   Escherichia coli NOT DETECTED NOT DETECTED Final   Klebsiella oxytoca NOT DETECTED NOT DETECTED Final   Klebsiella pneumoniae NOT DETECTED NOT DETECTED Final   Proteus species NOT DETECTED NOT DETECTED Final   Serratia marcescens NOT DETECTED NOT DETECTED Final   Haemophilus influenzae NOT DETECTED NOT DETECTED Final   Neisseria meningitidis NOT DETECTED NOT DETECTED Final   Pseudomonas aeruginosa NOT DETECTED NOT DETECTED Final   Candida albicans NOT DETECTED NOT DETECTED Final   Candida glabrata NOT DETECTED NOT DETECTED Final   Candida krusei NOT DETECTED NOT DETECTED Final   Candida parapsilosis NOT DETECTED NOT DETECTED Final   Candida tropicalis NOT DETECTED NOT DETECTED Final  Blood culture (routine x 2)     Status: None (Preliminary result)   Collection Time: 10/11/17  3:18 AM  Result Value Ref Range Status   Specimen Description BLOOD SPECIMEN SOURCE NOT MARKED ON REQUISITION  Final   Special Requests   Final    BOTTLES DRAWN AEROBIC AND ANAEROBIC Blood Culture adequate volume   Culture NO GROWTH 2 DAYS  Final   Report Status PENDING  Incomplete  MRSA PCR Screening     Status: None   Collection Time: 10/11/17  3:31 AM  Result Value Ref Range Status   MRSA by PCR NEGATIVE NEGATIVE Final    Comment:        The GeneXpert MRSA Assay (FDA approved for NASAL specimens only), is one component of a comprehensive MRSA colonization surveillance program. It is not intended to diagnose  MRSA infection nor to guide or monitor treatment for MRSA infections.      Time coordinating discharge: Over 30 minutes  SIGNED:   Alberteen Sam, MD  [facility] 10/13/2017, 1:18 PM   If 7PM-7AM, please contact night-coverage [email or web address] Password [email or web address]

## 2017-10-16 LAB — CULTURE, BLOOD (ROUTINE X 2)
CULTURE: NO GROWTH
Special Requests: ADEQUATE

## 2017-10-18 ENCOUNTER — Encounter (INDEPENDENT_AMBULATORY_CARE_PROVIDER_SITE_OTHER): Payer: Self-pay | Admitting: Orthopaedic Surgery

## 2017-10-18 ENCOUNTER — Encounter: Payer: Self-pay | Admitting: Podiatry

## 2017-10-18 ENCOUNTER — Encounter (INDEPENDENT_AMBULATORY_CARE_PROVIDER_SITE_OTHER): Payer: Self-pay | Admitting: Physical Medicine and Rehabilitation

## 2017-10-20 ENCOUNTER — Emergency Department (HOSPITAL_COMMUNITY): Payer: Medicare Other

## 2017-10-20 ENCOUNTER — Inpatient Hospital Stay (HOSPITAL_COMMUNITY): Payer: Medicare Other

## 2017-10-20 ENCOUNTER — Inpatient Hospital Stay (HOSPITAL_COMMUNITY)
Admission: EM | Admit: 2017-10-20 | Discharge: 2017-10-23 | DRG: 637 | Discharge status: Against Medical Advice | Payer: Medicare Other | Attending: Internal Medicine | Admitting: Internal Medicine

## 2017-10-20 DIAGNOSIS — R579 Shock, unspecified: Secondary | ICD-10-CM

## 2017-10-20 DIAGNOSIS — R001 Bradycardia, unspecified: Secondary | ICD-10-CM | POA: Diagnosis not present

## 2017-10-20 DIAGNOSIS — G931 Anoxic brain damage, not elsewhere classified: Secondary | ICD-10-CM | POA: Diagnosis not present

## 2017-10-20 DIAGNOSIS — E1110 Type 2 diabetes mellitus with ketoacidosis without coma: Secondary | ICD-10-CM | POA: Diagnosis present

## 2017-10-20 DIAGNOSIS — F209 Schizophrenia, unspecified: Secondary | ICD-10-CM | POA: Diagnosis present

## 2017-10-20 DIAGNOSIS — I10 Essential (primary) hypertension: Secondary | ICD-10-CM | POA: Diagnosis present

## 2017-10-20 DIAGNOSIS — R0902 Hypoxemia: Secondary | ICD-10-CM | POA: Diagnosis present

## 2017-10-20 DIAGNOSIS — F329 Major depressive disorder, single episode, unspecified: Secondary | ICD-10-CM | POA: Diagnosis present

## 2017-10-20 DIAGNOSIS — G8929 Other chronic pain: Secondary | ICD-10-CM | POA: Diagnosis present

## 2017-10-20 DIAGNOSIS — I959 Hypotension, unspecified: Secondary | ICD-10-CM | POA: Diagnosis present

## 2017-10-20 DIAGNOSIS — Z9114 Patient's other noncompliance with medication regimen: Secondary | ICD-10-CM

## 2017-10-20 DIAGNOSIS — G9340 Encephalopathy, unspecified: Secondary | ICD-10-CM | POA: Diagnosis present

## 2017-10-20 DIAGNOSIS — E860 Dehydration: Secondary | ICD-10-CM | POA: Diagnosis present

## 2017-10-20 DIAGNOSIS — E1011 Type 1 diabetes mellitus with ketoacidosis with coma: Principal | ICD-10-CM | POA: Diagnosis present

## 2017-10-20 DIAGNOSIS — Z9911 Dependence on respirator [ventilator] status: Secondary | ICD-10-CM | POA: Diagnosis not present

## 2017-10-20 DIAGNOSIS — F419 Anxiety disorder, unspecified: Secondary | ICD-10-CM | POA: Diagnosis present

## 2017-10-20 DIAGNOSIS — R680 Hypothermia, not associated with low environmental temperature: Secondary | ICD-10-CM | POA: Diagnosis present

## 2017-10-20 DIAGNOSIS — M25519 Pain in unspecified shoulder: Secondary | ICD-10-CM

## 2017-10-20 DIAGNOSIS — D638 Anemia in other chronic diseases classified elsewhere: Secondary | ICD-10-CM | POA: Diagnosis present

## 2017-10-20 DIAGNOSIS — K219 Gastro-esophageal reflux disease without esophagitis: Secondary | ICD-10-CM | POA: Diagnosis present

## 2017-10-20 DIAGNOSIS — E876 Hypokalemia: Secondary | ICD-10-CM | POA: Diagnosis not present

## 2017-10-20 DIAGNOSIS — Z781 Physical restraint status: Secondary | ICD-10-CM | POA: Diagnosis not present

## 2017-10-20 DIAGNOSIS — R451 Restlessness and agitation: Secondary | ICD-10-CM | POA: Diagnosis not present

## 2017-10-20 DIAGNOSIS — N179 Acute kidney failure, unspecified: Secondary | ICD-10-CM | POA: Diagnosis not present

## 2017-10-20 DIAGNOSIS — T68XXXA Hypothermia, initial encounter: Secondary | ICD-10-CM | POA: Diagnosis not present

## 2017-10-20 DIAGNOSIS — G9341 Metabolic encephalopathy: Secondary | ICD-10-CM | POA: Diagnosis present

## 2017-10-20 DIAGNOSIS — F17210 Nicotine dependence, cigarettes, uncomplicated: Secondary | ICD-10-CM | POA: Diagnosis present

## 2017-10-20 DIAGNOSIS — L97529 Non-pressure chronic ulcer of other part of left foot with unspecified severity: Secondary | ICD-10-CM | POA: Diagnosis present

## 2017-10-20 DIAGNOSIS — Z79899 Other long term (current) drug therapy: Secondary | ICD-10-CM | POA: Diagnosis not present

## 2017-10-20 DIAGNOSIS — N485 Ulcer of penis: Secondary | ICD-10-CM | POA: Diagnosis present

## 2017-10-20 DIAGNOSIS — E1065 Type 1 diabetes mellitus with hyperglycemia: Secondary | ICD-10-CM | POA: Diagnosis present

## 2017-10-20 DIAGNOSIS — M25511 Pain in right shoulder: Secondary | ICD-10-CM | POA: Diagnosis not present

## 2017-10-20 DIAGNOSIS — E0811 Diabetes mellitus due to underlying condition with ketoacidosis with coma: Secondary | ICD-10-CM | POA: Diagnosis not present

## 2017-10-20 DIAGNOSIS — E10621 Type 1 diabetes mellitus with foot ulcer: Secondary | ICD-10-CM | POA: Diagnosis present

## 2017-10-20 DIAGNOSIS — E1010 Type 1 diabetes mellitus with ketoacidosis without coma: Secondary | ICD-10-CM | POA: Diagnosis not present

## 2017-10-20 DIAGNOSIS — J9601 Acute respiratory failure with hypoxia: Secondary | ICD-10-CM | POA: Diagnosis not present

## 2017-10-20 LAB — BASIC METABOLIC PANEL
BUN: 20 mg/dL (ref 6–20)
CHLORIDE: 103 mmol/L (ref 101–111)
CO2: <7 mmol/L — ABNORMAL LOW (ref 22–32)
CREATININE: 1.94 mg/dL — AB (ref 0.61–1.24)
Calcium: 6.6 mg/dL — ABNORMAL LOW (ref 8.9–10.3)
GFR, EST AFRICAN AMERICAN: 51 mL/min — AB (ref >60)
GFR, EST NON AFRICAN AMERICAN: 44 mL/min — AB (ref >60)
Glucose, Bld: 809 mg/dL (ref 65–99)
Potassium: 6.2 mmol/L — ABNORMAL HIGH (ref 3.5–5.1)
SODIUM: 125 mmol/L — AB (ref 135–145)

## 2017-10-20 LAB — I-STAT CG4 LACTIC ACID, ED: Lactic Acid, Venous: 2.65 mmol/L (ref 0.5–1.9)

## 2017-10-20 LAB — URINALYSIS, ROUTINE W REFLEX MICROSCOPIC
BILIRUBIN URINE: NEGATIVE
KETONES UR: 80 mg/dL — AB
Leukocytes, UA: NEGATIVE
NITRITE: NEGATIVE
PH: 5 (ref 5.0–8.0)
PROTEIN: 100 mg/dL — AB
Specific Gravity, Urine: 1.015 (ref 1.005–1.030)

## 2017-10-20 LAB — GLUCOSE, CAPILLARY: Glucose-Capillary: >600 mg/dL (ref 65–99)

## 2017-10-20 LAB — CBC WITH DIFFERENTIAL/PLATELET
BASOS ABS: 0 10*3/uL (ref 0.0–0.1)
BASOS PCT: 0 %
EOS PCT: 0 %
Eosinophils Absolute: 0 10*3/uL (ref 0.0–0.7)
HEMATOCRIT: 33.3 % — AB (ref 39.0–52.0)
HEMOGLOBIN: 10.3 g/dL — AB (ref 13.0–17.0)
LYMPHS ABS: 10.3 10*3/uL — AB (ref 0.7–4.0)
LYMPHS PCT: 21 %
MCH: 29.4 pg (ref 26.0–34.0)
MCHC: 30.9 g/dL (ref 30.0–36.0)
MCV: 95.1 fL (ref 78.0–100.0)
MONOS PCT: 8 %
Monocytes Absolute: 3.9 10*3/uL — ABNORMAL HIGH (ref 0.1–1.0)
NEUTROS ABS: 34.9 10*3/uL — AB (ref 1.7–7.7)
Neutrophils Relative %: 71 %
Platelets: 684 10*3/uL — ABNORMAL HIGH (ref 150–400)
RBC: 3.5 MIL/uL — ABNORMAL LOW (ref 4.22–5.81)
RDW: 13.5 % (ref 11.5–15.5)
WBC: 49.1 10*3/uL — AB (ref 4.0–10.5)

## 2017-10-20 LAB — BETA-HYDROXYBUTYRIC ACID

## 2017-10-20 LAB — I-STAT TROPONIN, ED: TROPONIN I, POC: 0.01 ng/mL (ref 0.00–0.08)

## 2017-10-20 LAB — CBG MONITORING, ED: Glucose-Capillary: >600 mg/dL (ref 65–99)

## 2017-10-20 LAB — ETHANOL: Alcohol, Ethyl (B): <10 mg/dL (ref <10)

## 2017-10-20 LAB — MAGNESIUM: Magnesium: 2.2 mg/dL (ref 1.7–2.4)

## 2017-10-20 LAB — PHOSPHORUS: Phosphorus: 7.9 mg/dL — ABNORMAL HIGH (ref 2.5–4.6)

## 2017-10-20 MED ORDER — SODIUM CHLORIDE 0.9 % IV SOLN
INTRAVENOUS | Status: DC
  Administered 2017-10-21: 01:00:00 via INTRAVENOUS

## 2017-10-20 MED ORDER — SODIUM BICARBONATE 8.4 % IV SOLN
INTRAVENOUS | Status: AC
  Filled 2017-10-20: qty 100

## 2017-10-20 MED ORDER — SODIUM CHLORIDE 0.9 % IV SOLN: INTRAVENOUS | Status: AC

## 2017-10-20 MED ORDER — NOREPINEPHRINE BITARTRATE 1 MG/ML IV SOLN
0.0000 ug/min | Freq: Once | INTRAVENOUS | Status: AC
  Administered 2017-10-20: 10 ug/min via INTRAVENOUS
  Filled 2017-10-20: qty 4

## 2017-10-20 MED ORDER — INSULIN REGULAR HUMAN 100 UNIT/ML IJ SOLN
INTRAMUSCULAR | Status: DC
  Administered 2017-10-20: 5.4 [IU]/h via INTRAVENOUS
  Administered 2017-10-21: 14.2 [IU]/h via INTRAVENOUS
  Filled 2017-10-20 (×2): qty 1

## 2017-10-20 MED ORDER — HEPARIN SODIUM (PORCINE) 5000 UNIT/ML IJ SOLN
5000.0000 [IU] | Freq: Three times a day (TID) | INTRAMUSCULAR | Status: DC
  Administered 2017-10-21 – 2017-10-23 (×7): 5000 [IU] via SUBCUTANEOUS
  Filled 2017-10-20 (×7): qty 1

## 2017-10-20 MED ORDER — PANTOPRAZOLE SODIUM 40 MG IV SOLR
40.0000 mg | INTRAVENOUS | Status: DC
  Administered 2017-10-21: 40 mg via INTRAVENOUS
  Filled 2017-10-20: qty 40

## 2017-10-20 MED ORDER — PHENYLEPHRINE 40 MCG/ML (10ML) SYRINGE FOR IV PUSH (FOR BLOOD PRESSURE SUPPORT)
PREFILLED_SYRINGE | INTRAVENOUS | Status: AC
  Filled 2017-10-20: qty 10

## 2017-10-20 MED ORDER — STERILE WATER FOR INJECTION IV SOLN
INTRAVENOUS | Status: DC
  Administered 2017-10-21: 01:00:00 via INTRAVENOUS
  Filled 2017-10-20 (×3): qty 850

## 2017-10-20 MED ORDER — SODIUM CHLORIDE 0.9 % IV BOLUS (SEPSIS)
3000.0000 mL | Freq: Once | INTRAVENOUS | Status: AC
  Administered 2017-10-20: 3000 mL via INTRAVENOUS

## 2017-10-20 MED ORDER — INSULIN REGULAR HUMAN 100 UNIT/ML IJ SOLN
INTRAMUSCULAR | Status: DC
  Filled 2017-10-20: qty 1

## 2017-10-20 MED ORDER — NOREPINEPHRINE BITARTRATE 1 MG/ML IV SOLN
0.0000 ug/min | INTRAVENOUS | Status: DC
  Administered 2017-10-21: 14 ug/min via INTRAVENOUS
  Filled 2017-10-20 (×2): qty 4

## 2017-10-20 MED ORDER — DEXTROSE-NACL 5-0.45 % IV SOLN
INTRAVENOUS | Status: DC
  Administered 2017-10-21 (×2): via INTRAVENOUS

## 2017-10-20 MED ORDER — LACTATED RINGERS IV SOLN
INTRAVENOUS | Status: DC
  Administered 2017-10-20: 21:00:00 via INTRAVENOUS

## 2017-10-20 NOTE — H&P (Signed)
PULMONARY / CRITICAL CARE MEDICINE   Name: [name] MRN: [id number] DOB: [birth year]    ADMISSION DATE:  10/20/2017 CONSULTATION DATE:  10/20/2017  REFERRING MD:  Dr. Eudelia Bunch   CHIEF COMPLAINT:  Unresponsive   HISTORY OF PRESENT ILLNESS: Information obtained from medical record as patient is intubated and unresponsive   31 year old male with PMH of Type 1 DM and schizophrenia, recent multiple DKA admissions due to medical non-compliance. Admitted 10/24-10/27 with DKA, at this time Psychiatry was consulted however patient refused to cooperate with consult.   11/3 patient was found down at home by roommate with unknown down time. When EMS arrived patient was unresponsive, cold to touch, and glucose level  "high". Upon arrival to ED temp 85 rectal, HR 58, BP 60/14. Patient intubated. PCCM consulted for admission.   PAST MEDICAL HISTORY :  He  has a past medical history of Anemia; Anxiety; Arthritis; Chicken pox; Childhood asthma; Chronic pain; Depression; DKA (diabetic ketoacidoses) ([name]) (11/18/2014); DKA (diabetic ketoacidoses) ([name]) (07/05/2017); GERD (gastroesophageal reflux disease); Hypertension; Migraine; Noncompliance with medication regimen; Pneumonia; Polysubstance abuse ([name]); Schizo affective schizophrenia ([name]); Scoliosis; and Type I diabetes mellitus ([name]).  PAST SURGICAL HISTORY: He  has a past surgical history that includes Incision and drainage abscess (Left, 11/2011) and Cardiac catheterization (11/2016).  Allergies  Allergen Reactions  . Sulfa Antibiotics Other (See Comments)    Unknown childhood allergy  . Fentanyl Other (See Comments)    Patient had hallucinations with the medication    No current facility-administered medications on file prior to encounter.    Current Outpatient Prescriptions on File Prior to Encounter  Medication Sig  . acetaminophen (TYLENOL) 500 MG tablet Take 1 tablet (500 mg total) by mouth every 6 (six) hours as needed.  .  diclofenac (CATAFLAM) 50 MG tablet Take 1 tablet (50 mg total) by mouth 2 (two) times daily as needed. For right shoulder pain  . doxepin (SINEQUAN) 10 MG/ML solution Take 10 mLs by mouth at bedtime.  . insulin aspart (NOVOLOG FLEXPEN) 100 UNIT/ML FlexPen Inject 0-15 Units into the skin See admin instructions. Check Blood Sugar 4 times per day > with meals and at bedtime CBG 70 - 120: 0 units CBG 121 - 150: 2 units CBG 151 - 200: 3 units CBG 201 - 250: 5 units CBG 251 - 300: 8 units CBG 301 - 350: 11 units CBG 351 - 400: 15 units  . insulin detemir (LEVEMIR) 100 UNIT/ML injection Inject 0.3 mLs (30 Units total) into the skin daily.  . metoCLOPramide (REGLAN) 10 MG tablet Take 1 tablet (10 mg total) by mouth 3 (three) times daily as needed for nausea or vomiting.  . neomycin-bacitracin-polymyxin (NEOSPORIN) OINT Apply 1 application topically daily. Apply Neosporin to left heel daily, then cover with gauze and kerlex  . pantoprazole (PROTONIX) 40 MG tablet Take 1 tablet (40 mg total) by mouth 2 (two) times daily before a meal.  . potassium chloride (K-DUR) 10 MEQ tablet Take 1 tablet (10 mEq total) by mouth daily.  . risperiDONE (RISPERDAL) 2 MG tablet Take 2 mg by mouth at bedtime.  . sennosides-docusate sodium (SENOKOT-S) 8.6-50 MG tablet Take 1 tablet by mouth as needed for constipation.  . traZODone (DESYREL) 150 MG tablet Take 150 mg by mouth at bedtime.  . [DISCONTINUED] citalopram (CELEXA) 20 MG tablet Take 20 mg by mouth daily.  . [DISCONTINUED] FLUoxetine (PROZAC) 20 MG tablet Take 20 mg by mouth every morning.  . [DISCONTINUED] insulin NPH-regular Human (  NOVOLIN 70/30) (70-30) 100 UNIT/ML injection Inject 40 Units into the skin 2 (two) times daily with a meal.  . [DISCONTINUED] OLANZapine zydis (ZYPREXA) 10 MG disintegrating tablet Take 10 mg by mouth 2 (two) times daily.  . [DISCONTINUED] sertraline (ZOLOFT) 50 MG tablet Take 50 mg by mouth daily. For depression. Just started med, have  not picked up yet [facility] [address]    FAMILY HISTORY:  His indicated that his mother is alive.    SOCIAL HISTORY: He  reports that he has been smoking Cigarettes.  He has a 4.50 pack-year smoking history. He has never used smokeless tobacco. He reports that he uses drugs, including Marijuana, Cocaine, Methamphetamines, and "Crack" cocaine. He reports that he does not drink alcohol.  REVIEW OF SYSTEMS:   Unable to review as patient is intubated and encephalopathic   SUBJECTIVE:   VITAL SIGNS: BP 135/89   Pulse 69   Temp (!) 85 F (29.4 C) (Rectal)   Resp 16   Ht 6\' 1"  (1.854 m)   Wt 67.6 kg (149 lb)   SpO2 100%   BMI 19.66 kg/m   HEMODYNAMICS:    VENTILATOR SETTINGS: Vent Mode: PRVC FiO2 (%):  [100 %] 100 % Set Rate:  [16 bmp] 16 bmp Vt Set:  [600 mL] 600 mL PEEP:  [5 cmH20] 5 cmH20 Plateau Pressure:  [12 cmH20] 12 cmH20  INTAKE / OUTPUT: No intake/output data recorded.  PHYSICAL EXAMINATION: General:  Adult male, non-responsive  Neuro:  Pupils fixed and dilated (84mm), no gag/cough HEENT:  ETT in place  Cardiovascular:  RRR, no MRG  Lungs:  Clear breath sounds, no wheeze  Abdomen:  Active bowel sounds, non-distended Musculoskeletal:  -edema  Skin:  Cold to touch  LABS:  BMET No results for input(s): NA, K, CL, CO2, BUN, CREATININE, GLUCOSE in the last 168 hours.  Electrolytes No results for input(s): CALCIUM, MG, PHOS in the last 168 hours.  CBC No results for input(s): WBC, HGB, HCT, PLT in the last 168 hours.  Coag's No results for input(s): APTT, INR in the last 168 hours.  Sepsis Markers  Recent Labs Lab 10/20/17 2059  LATICACIDVEN 2.65*    ABG No results for input(s): PHART, PCO2ART, PO2ART in the last 168 hours.  Liver Enzymes No results for input(s): AST, ALT, ALKPHOS, BILITOT, ALBUMIN in the last 168 hours.  Cardiac Enzymes No results for input(s): TROPONINI, PROBNP in the last 168 hours.  Glucose  Recent Labs Lab  10/20/17 2013  GLUCAP >600*    Imaging No results found.   STUDIES:  KUB 11/3 > Lung bases clear, enteric tube tip and side port project over the stomach  CT Head 11/3 >>  CXR 11/3 > 1. Right IJ approach centra venous catheter tip overlying the proximal right atrium. 2. Tip of endotracheal tube approximately 7.7 cm above the carina. 3. No radiographic evidence for active cardiopulmonary disease.  CULTURES: Blood 11/3 >> U/A 11/3 >> Sputum 11/3 >>   ANTIBIOTICS: None.   SIGNIFICANT EVENTS: 11/3 > Presents to ED   LINES/TUBES: ETT 11/3 >>   DISCUSSION: 31 year old with 1DM and medical non-compliance, presents to ED after being found unresponsive for unknown time. Intubated upon arrival.   ASSESSMENT / PLAN:  PULMONARY A: Acute Hypoxic Respiratory Distress  CXR > No acute  P:   Vent Support  VAP Bundle  Trend CXR/ABG > Advance ETT 3 cm > Repeat CXR in AM    CARDIOVASCULAR A:  Hypotension  in setting of severe dehydration and DKA  Troponin 0.01  H/O HTN  P:  Cardiac Monitoring  Maintain MAP >65   RENAL A:   Anion-Gap Metabolic Acidosis  AKI s/p 5L NS (Base Crt 0.6-0.8)  Crt 1.94 >>  P:   Trend BMP  Replace electrolytes as indicated  Trend LA  Bicarb gtt @ 125 ml/hr (until pH >7, currently VBG 6.5)   GASTROINTESTINAL A:   GERD  P:   NPO PPI   HEMATOLOGIC A:   Anemia  P:  Trend CBC  Heparin SQ for VTE SCDs  Transfuse for Hbg <7   INFECTIOUS A:   Leukocytosis  WBC >> LA >> Chronic Left Diabetic Foot Ulcer  P:   Trend WBC and Fever Curve  Trend PCT and LA  PAN Culture  Monitor off Antibiotics    ENDOCRINE A:   DKA H/O Noncompliant 1DM    P:   DKA Protocol   NEUROLOGIC A:   Metabolic Encephalopathy vs Anoxic Injury  UDS >> ETOH <10 H/O Schizophrenia, Depression  P:   RASS goal: 0 Monitor  Hold home Trazodone and Risperdal  Avoid Sedative Medications  Head CT pending  EEG ordered   FAMILY  - Updates: Mother [name]  updated via phone   - Inter-disciplinary family meet or Palliative Care meeting due by:  10/27/2017   CC Time: 55 minutes   Jovita Kussmaul, AGACNP-BC [facility]  Pgr: [phone]  PCCM Pgr: [phone]

## 2017-10-20 NOTE — ED Provider Notes (Signed)
[facility] MEDICAL ICU Provider Note  CSN: [id number] Arrival date & time: 10/20/17 2005  Chief Complaint(s) Altered Mental Status; Hyperglycemia; and Hypotension  HPI [name] is a 31 y.o. male patient presents by EMS for altered mental status.  Patient was found at home unresponsive.  No known last normal.  In route patient was hypothermic, bradycardic and hypotensive.  IO inserted and IV fluid bolus initiated.  CBG read high.  Remainder of history, ROS, and physical exam limited due to patient's condition (AMS). Additional information was obtained from EMS.   Level V Caveat.    HPI  Past Medical History Past Medical History:  Diagnosis Date  . Anemia   . Anxiety   . Arthritis    "both hips; both shoulders" (06/06/2017)  . Chicken pox   . Childhood asthma   . Chronic pain   . Depression   . DKA (diabetic ketoacidoses) ([name]) 11/18/2014  . DKA (diabetic ketoacidoses) ([name]) 07/05/2017  . GERD (gastroesophageal reflux disease)   . Hypertension   . Migraine    "a few/year" (06/06/2017)  . Noncompliance with medication regimen   . Pneumonia    "several times" (06/06/2017)  . Polysubstance abuse ([name])   . Schizo affective schizophrenia ([name])   . Scoliosis   . Type I diabetes mellitus [facility])    Patient Active Problem List   Diagnosis Date Noted  . Pressure injury of skin 10/11/2017  . Encephalopathy acute   . Hyperbilirubinemia 09/14/2017  . Heel ulcer ([name]) 09/14/2017  . Noncompliance with medication regimen   . Hypokalemia   . Non healing left heel wound   . Cellulitis   . DKA, type 1 ([name]) 08/17/2017  . Encounter for imaging study to confirm orogastric (OG) tube placement   . Acute respiratory acidosis   . DKA (diabetic ketoacidoses) ([name]) 08/04/2017  . Acute respiratory failure with hypoxia ([name])   . Seizure ([name])   . Diabetic ketoacidosis ([name]) 07/30/2017  . Normocytic anemia 07/05/2017  . Numbness of right hand 07/05/2017  .  Elevated troponin I measurement   . Positive D dimer   . DKA, type 1, not at goal [facility]) 07/01/2017  . Hyperkalemia   . AKI (acute kidney injury) ([name])   . Medically noncompliant   . Hyperglycemia 06/16/2017  . Nausea & vomiting 06/15/2017  . Abdominal pain 06/15/2017  . Sepsis ([name]) 06/07/2017  . Chest pain 06/07/2017  . GERD (gastroesophageal reflux disease) 06/07/2017  . Substance abuse ([name]) 05/30/2016  . Substance induced mood disorder ([name]) 05/30/2016  . Tobacco use disorder 12/24/2015  . DM hyperosmolarity type I, uncontrolled ([name]) 12/19/2015  . Generalized headache 07/22/2015  . Bilateral hip bursitis 05/26/2015  . Depression   . Generalized anxiety disorder 03/29/2015  . Undifferentiated schizophrenia ([name])   . Hip pain, bilateral 11/18/2014  . Dehydration 06/17/2012   Home Medication(s) Prior to Admission medications   Medication Sig Start Date End Date Taking? Authorizing Provider  acetaminophen (TYLENOL) 500 MG tablet Take 1 tablet (500 mg total) by mouth every 6 (six) hours as needed. 09/10/17   Maxie Barb, MD  diclofenac (CATAFLAM) 50 MG tablet Take 1 tablet (50 mg total) by mouth 2 (two) times daily as needed. For right shoulder pain 10/13/17   Danford, Earl Lites, MD  doxepin (SINEQUAN) 10 MG/ML solution Take 10 mLs by mouth at bedtime. 07/11/17   [provider]  insulin aspart (NOVOLOG FLEXPEN) 100 UNIT/ML FlexPen Inject 0-15 Units into the skin  See admin instructions. Check Blood Sugar 4 times per day > with meals and at bedtime CBG 70 - 120: 0 units CBG 121 - 150: 2 units CBG 151 - 200: 3 units CBG 201 - 250: 5 units CBG 251 - 300: 8 units CBG 301 - 350: 11 units CBG 351 - 400: 15 units 09/15/17   Rai, Ripudeep K, MD  insulin detemir (LEVEMIR) 100 UNIT/ML injection Inject 0.3 mLs (30 Units total) into the skin daily. 09/15/17   Rai, Delene Ruffini, MD  metoCLOPramide (REGLAN) 10 MG tablet Take 1 tablet (10 mg total) by mouth 3 (three) times  daily as needed for nausea or vomiting. 09/15/17   Rai, Delene Ruffini, MD  neomycin-bacitracin-polymyxin (NEOSPORIN) OINT Apply 1 application topically daily. Apply Neosporin to left heel daily, then cover with gauze and kerlex 09/16/17   Rai, Ripudeep K, MD  pantoprazole (PROTONIX) 40 MG tablet Take 1 tablet (40 mg total) by mouth 2 (two) times daily before a meal. 10/13/17   Danford, Earl Lites, MD  potassium chloride (K-DUR) 10 MEQ tablet Take 1 tablet (10 mEq total) by mouth daily. 09/10/17   Maxie Barb, MD  risperiDONE (RISPERDAL) 2 MG tablet Take 2 mg by mouth at bedtime.    [provider]  sennosides-docusate sodium (SENOKOT-S) 8.6-50 MG tablet Take 1 tablet by mouth as needed for constipation.    [provider]  traZODone (DESYREL) 150 MG tablet Take 150 mg by mouth at bedtime. 06/26/17   [provider]                                                                                                                                    Past Surgical History Past Surgical History:  Procedure Laterality Date  . CARDIAC CATHETERIZATION  11/2016  . INCISION AND DRAINAGE ABSCESS Left 11/2011   "MRSA removed off my thumb"   Family History Family History  Problem Relation Age of Onset  . Diabetes Mother     Social History Social History  Substance Use Topics  . Smoking status: Current Every Day Smoker    Packs/day: 0.50    Years: 9.00    Types: Cigarettes  . Smokeless tobacco: Never Used  . Alcohol use No   Allergies Sulfa antibiotics and Fentanyl  Review of Systems Review of Systems  Unable to perform ROS: Mental status change    Physical Exam Vital Signs  I have reviewed the triage vital signs BP 60/41   Pulse (!) 58   Temp (!) 85 F (29.4 C) (Rectal)   Resp 10   Ht 6\' 1"  (1.854 m)   Wt 67.6 kg (149 lb)   SpO2 100%   BMI 19.66 kg/m   Physical Exam  Constitutional: He appears well-developed and well-nourished. He appears  lethargic. He appears ill. No distress.  HENT:  Head: Normocephalic and atraumatic.  Nose: Nose normal.  Eyes: Pupils are equal,  round, and reactive to light. Conjunctivae and EOM are normal. Right eye exhibits no discharge. Left eye exhibits no discharge. No scleral icterus.  Neck: Normal range of motion. Neck supple.  Cardiovascular: Regular rhythm.  Bradycardia present.  Exam reveals no gallop and no friction rub.   No murmur heard. Pulmonary/Chest: Effort normal and breath sounds normal. No stridor. No respiratory distress. He has no rales.  Abdominal: Soft. He exhibits no distension. There is no tenderness.  Musculoskeletal: He exhibits no edema or tenderness.  Neurological: He appears lethargic. GCS eye subscore is 1. GCS verbal subscore is 2. GCS motor subscore is 4.  Skin: Skin is dry. No rash noted. He is not diaphoretic. No erythema.  Cool to the touch  Psychiatric: He has a normal mood and affect.  Vitals reviewed.    ED Results and Treatments Labs (all labs ordered are listed, but only abnormal results are displayed) Labs Reviewed  CBC WITH DIFFERENTIAL/PLATELET - Abnormal; Notable for the following:       Result Value   RBC 3.50 (*)    Hemoglobin 10.3 (*)    HCT 33.3 (*)    All other components within normal limits  URINALYSIS, ROUTINE W REFLEX MICROSCOPIC - Abnormal; Notable for the following:    Glucose, UA >=500 (*)    Hgb urine dipstick SMALL (*)    Ketones, ur 80 (*)    Protein, ur 100 (*)    Bacteria, UA RARE (*)    Squamous Epithelial / LPF 0-5 (*)    All other components within normal limits  BASIC METABOLIC PANEL - Abnormal; Notable for the following:    Sodium 125 (*)    Potassium 6.2 (*)    CO2 <7 (*)    Glucose, Bld 809 (*)    Creatinine, Ser 1.94 (*)    Calcium 6.6 (*)    GFR calc non Af Amer 44 (*)    GFR calc Af Amer 51 (*)    All other components within normal limits  BETA-HYDROXYBUTYRIC ACID - Abnormal; Notable for the following:     Beta-Hydroxybutyric Acid >8.00 (*)    All other components within normal limits  PHOSPHORUS - Abnormal; Notable for the following:    Phosphorus 7.9 (*)    All other components within normal limits  I-STAT CG4 LACTIC ACID, ED - Abnormal; Notable for the following:    Lactic Acid, Venous 2.65 (*)    All other components within normal limits  CBG MONITORING, ED - Abnormal; Notable for the following:    Glucose-Capillary >600 (*)    All other components within normal limits  CBG MONITORING, ED - Abnormal; Notable for the following:    Glucose-Capillary >600 (*)    All other components within normal limits  CULTURE, BLOOD (ROUTINE X 2)  CULTURE, BLOOD (ROUTINE X 2)  CULTURE, RESPIRATORY (NON-EXPECTORATED)  MAGNESIUM  ETHANOL  LACTIC ACID, PLASMA  LACTIC ACID, PLASMA  PROCALCITONIN  RAPID URINE DRUG SCREEN, HOSP PERFORMED  BASIC METABOLIC PANEL  BASIC METABOLIC PANEL  BASIC METABOLIC PANEL  MAGNESIUM  PHOSPHORUS  CBC  I-STAT CHEM 8, ED  I-STAT TROPONIN, ED  I-STAT VENOUS BLOOD GAS, ED  CBG MONITORING, ED  EKG  EKG Interpretation  Date/Time:    Ventricular Rate:    PR Interval:    QRS Duration:   QT Interval:    QTC Calculation:   R Axis:     Text Interpretation:        Radiology Dg Chest Portable 1 View  Result Date: 10/20/2017 CLINICAL DATA:  Initial evaluation for central line placement. EXAM: PORTABLE CHEST 1 VIEW COMPARISON:  Prior radiograph from 10/11/2017. FINDINGS: Endotracheal tube in place with tip positioned 7.8 cm above the carina. Right IJ approach centra venous catheter in place with tip overlying the proximal right atrium. Enteric tube courses into the abdomen. Cardiac mediastinal silhouettes within normal limits. Lungs hypoinflated. No focal infiltrates. No pulmonary edema or pleural effusion. No appreciable pneumothorax, although the  right lung apex is incompletely visualized. No acute osseus abnormality. IMPRESSION: 1. Right IJ approach centra venous catheter tip overlying the proximal right atrium. 2. Tip of endotracheal tube approximately 7.7 cm above the carina. 3. No radiographic evidence for active cardiopulmonary disease. Electronically Signed   By: Rise Mu M.D.   On: 10/20/2017 22:48   Dg Abd Portable 1 View  Result Date: 10/20/2017 CLINICAL DATA:  Patient status post OG tube placement. EXAM: PORTABLE ABDOMEN - 1 VIEW COMPARISON:  Abdominal radiograph 08/04/2017. FINDINGS: Lung bases are clear. Enteric tube tip and side-port project over the stomach. Nonobstructed bowel gas pattern. IMPRESSION: Enteric tube tip and side-port project over the stomach. Electronically Signed   By: Annia Belt M.D.   On: 10/20/2017 21:38   Pertinent labs & imaging results that were available during my care of the patient were reviewed by me and considered in my medical decision making (see chart for details).  Medications Ordered in ED Medications  phenylephrine 0.4-0.9 MG/10ML-% injection (not administered)  0.9 %  sodium chloride infusion (not administered)  0.9 %  sodium chloride infusion (not administered)  dextrose 5 %-0.45 % sodium chloride infusion (not administered)  insulin regular (NOVOLIN R,HUMULIN R) 100 Units in sodium chloride 0.9 % 100 mL (1 Units/mL) infusion (5.4 Units/hr Intravenous New Bag/Given 10/20/17 2217)  heparin injection 5,000 Units (not administered)  sodium bicarbonate 150 mEq in sterile water 1,000 mL infusion (not administered)  pantoprazole (PROTONIX) injection 40 mg (not administered)  sodium bicarbonate 1 mEq/mL injection (not administered)  norepinephrine (LEVOPHED) 4 mg in dextrose 5 % 250 mL (0.016 mg/mL) infusion (not administered)  sodium chloride 0.9 % bolus 3,000 mL (0 mLs Intravenous Stopped 10/20/17 2223)  norepinephrine (LEVOPHED) 4 mg in dextrose 5 % 250 mL (0.016 mg/mL) infusion (0  mcg/min Intravenous Hold 10/20/17 2200)                                                                                                                                    Procedures Procedures CRITICAL CARE Performed by: Amadeo Garnet Cardama Total critical care time: 55 minutes Critical care time was exclusive of  separately billable procedures and treating other patients. Critical care was necessary to treat or prevent imminent or life-threatening deterioration. Critical care was time spent personally by me on the following activities: development of treatment plan with patient and/or surrogate as well as nursing, discussions with consultants, evaluation of patient's response to treatment, examination of patient, obtaining history from patient or surrogate, ordering and performing treatments and interventions, ordering and review of laboratory studies, ordering and review of radiographic studies, pulse oximetry and re-evaluation of patient's condition.   (including critical care time)  Medical Decision Making / ED Course I have reviewed the nursing notes for this encounter and the patient's prior records (if available in EHR or on provided paperwork).    Upon arrival patient was noted to be hypothermic, hypotensive, bradycardic with high CBG.  History of DKA.  Patient started on warm IV fluid bolus, pressors.  Intubated for airway protection by APP.  BEAR hugger applied. US guided IV placed by APP.  Workup is consistent with DKA.  Patient given IV fluid bolus and started on insulin infusion.  Also noted to have asymmetric pupils left greater than the right and nonreactive. CT head ordered to assess for any intracranial abnormalities.  CT head was delayed due to patient instability requiring central line.  However CT appears to be unremarkable  I was present throughout all procedures including intubation, central line, ultrasound-guided IV.  Patient was admitted to ICU for further workup and  management.  Final Clinical Impression(s) / ED Diagnoses Final diagnoses:  Diabetic ketoacidosis with coma associated with type 1 diabetes mellitus ([name])      This chart was dictated using voice recognition software.  Despite best efforts to proofread,  errors can occur which can change the documentation meaning.   Nira Conn, MD 10/20/17 [phone]

## 2017-10-20 NOTE — ED Notes (Signed)
Pt CBG read >600

## 2017-10-20 NOTE — ED Provider Notes (Signed)
INTUBATION Performed by: Thermon Leyland  Required items: required blood products, implants, devices, and special equipment available Patient identity confirmed: provided demographic data and hospital-assigned identification number Time out: Immediately prior to procedure a "time out" was called to verify the correct patient, procedure, equipment, support staff and site/side marked as required.  Indications: altered unable to control airway  Intubation method: Glidescope Laryngoscopy   Preoxygenation: BVM  Sedatives: Etomidate Paralytic: Rocuronium   Tube Size: 7.5 cuffed  Post-procedure assessment: chest rise and ETCO2 monitor Breath sounds: equal and absent over the epigastrium Tube secured with: ETT holder Chest x-ray interpreted by radiologist and me.  Chest x-ray findings: endotracheal tube in appropriate position  Patient tolerated the procedure well with no immediate complications.   CENTRAL LINE Performed by: Thermon Leyland Consent: The procedure was performed in an emergent situation. Required items: required blood products, implants, devices, and special equipment available Patient identity confirmed: arm band and provided demographic data Time out: Immediately prior to procedure a "time out" was called to verify the correct patient, procedure, equipment, support staff and site/side marked as required. Indications: vascular access Anesthesia: local infiltration Local anesthetic: None Anesthetic total: 3 ml Patient sedated: no Preparation: skin prepped with 2% chlorhexidine Skin prep agent dried: skin prep agent completely dried prior to procedure Sterile barriers: all five maximum sterile barriers used - cap, mask, sterile gown, sterile gloves, and large sterile sheet Hand hygiene: hand hygiene performed prior to central venous catheter insertion  Location details: Right IJ  Catheter type: triple lumen Catheter size: 8 Fr Pre-procedure: landmarks  identified Ultrasound guidance: Yes Successful placement: yes Post-procedure: line sutured and dressing applied Assessment: blood return through all parts, free fluid flow, placement verified by x-ray and no pneumothorax on x-ray Patient tolerance: Patient tolerated the procedure well with no immediate complications.    Eyvonne Mechanic, PA-C 10/20/17 2216

## 2017-10-20 NOTE — ED Triage Notes (Signed)
Pt BIB [facility] for altered mental status. PT was found down in bedroom. Dont know how long the patient was down. EMS checked sugar and it read high. Pt was cold to the touch. EMS attempted IV without success and placed an IO to the right [place]

## 2017-10-21 ENCOUNTER — Inpatient Hospital Stay (HOSPITAL_COMMUNITY): Payer: Medicare Other

## 2017-10-21 DIAGNOSIS — E1010 Type 1 diabetes mellitus with ketoacidosis without coma: Secondary | ICD-10-CM

## 2017-10-21 DIAGNOSIS — J9601 Acute respiratory failure with hypoxia: Secondary | ICD-10-CM

## 2017-10-21 DIAGNOSIS — G9340 Encephalopathy, unspecified: Secondary | ICD-10-CM

## 2017-10-21 LAB — LACTIC ACID, PLASMA
Lactic Acid, Venous: 2 mmol/L (ref 0.5–1.9)
Lactic Acid, Venous: 2.7 mmol/L (ref 0.5–1.9)

## 2017-10-21 LAB — CBC
HCT: 26.4 % — ABNORMAL LOW (ref 39.0–52.0)
HEMOGLOBIN: 9 g/dL — AB (ref 13.0–17.0)
MCH: 29.1 pg (ref 26.0–34.0)
MCHC: 34.1 g/dL (ref 30.0–36.0)
MCV: 85.4 fL (ref 78.0–100.0)
PLATELETS: 391 10*3/uL (ref 150–400)
RBC: 3.09 MIL/uL — AB (ref 4.22–5.81)
RDW: 13.6 % (ref 11.5–15.5)
WBC: 22.7 10*3/uL — AB (ref 4.0–10.5)

## 2017-10-21 LAB — BASIC METABOLIC PANEL
ANION GAP: 11 (ref 5–15)
ANION GAP: 3 — AB (ref 5–15)
Anion gap: 21 — ABNORMAL HIGH (ref 5–15)
BUN: 12 mg/dL (ref 6–20)
BUN: 19 mg/dL (ref 6–20)
BUN: 23 mg/dL — AB (ref 6–20)
BUN: 23 mg/dL — AB (ref 6–20)
CALCIUM: 7.5 mg/dL — AB (ref 8.9–10.3)
CALCIUM: 7.6 mg/dL — AB (ref 8.9–10.3)
CHLORIDE: 116 mmol/L — AB (ref 101–111)
CHLORIDE: 104 mmol/L (ref 101–111)
CO2: 8 mmol/L — AB (ref 22–32)
CO2: 16 mmol/L — AB (ref 22–32)
CO2: 20 mmol/L — AB (ref 22–32)
CREATININE: 2.27 mg/dL — AB (ref 0.61–1.24)
Calcium: 7.5 mg/dL — ABNORMAL LOW (ref 8.9–10.3)
Calcium: 7.6 mg/dL — ABNORMAL LOW (ref 8.9–10.3)
Chloride: 109 mmol/L (ref 101–111)
Chloride: 115 mmol/L — ABNORMAL HIGH (ref 101–111)
Creatinine, Ser: 2.2 mg/dL — ABNORMAL HIGH (ref 0.61–1.24)
Creatinine, Ser: 1.68 mg/dL — ABNORMAL HIGH (ref 0.61–1.24)
Creatinine, Ser: 0.9 mg/dL (ref 0.61–1.24)
GFR calc Af Amer: 42 mL/min — ABNORMAL LOW (ref >60)
GFR calc Af Amer: 44 mL/min — ABNORMAL LOW (ref >60)
GFR calc Af Amer: >60 mL/min (ref >60)
GFR calc Af Amer: >60 mL/min (ref >60)
GFR calc non Af Amer: 36 mL/min — ABNORMAL LOW (ref >60)
GFR, EST NON AFRICAN AMERICAN: 38 mL/min — AB (ref >60)
GFR, EST NON AFRICAN AMERICAN: 52 mL/min — AB (ref >60)
GLUCOSE: 129 mg/dL — AB (ref 65–99)
GLUCOSE: 286 mg/dL — AB (ref 65–99)
GLUCOSE: 459 mg/dL — AB (ref 65–99)
GLUCOSE: 761 mg/dL — AB (ref 65–99)
POTASSIUM: 2.7 mmol/L — AB (ref 3.5–5.1)
POTASSIUM: 3 mmol/L — AB (ref 3.5–5.1)
Potassium: 5.2 mmol/L — ABNORMAL HIGH (ref 3.5–5.1)
Potassium: 3.3 mmol/L — ABNORMAL LOW (ref 3.5–5.1)
Sodium: 131 mmol/L — ABNORMAL LOW (ref 135–145)
Sodium: 138 mmol/L (ref 135–145)
Sodium: 142 mmol/L (ref 135–145)
Sodium: 139 mmol/L (ref 135–145)

## 2017-10-21 LAB — GLUCOSE, CAPILLARY
GLUCOSE-CAPILLARY: 414 mg/dL — AB (ref 65–99)
GLUCOSE-CAPILLARY: 251 mg/dL — AB (ref 65–99)
GLUCOSE-CAPILLARY: 265 mg/dL — AB (ref 65–99)
GLUCOSE-CAPILLARY: 245 mg/dL — AB (ref 65–99)
GLUCOSE-CAPILLARY: 256 mg/dL — AB (ref 65–99)
GLUCOSE-CAPILLARY: 211 mg/dL — AB (ref 65–99)
GLUCOSE-CAPILLARY: 104 mg/dL — AB (ref 65–99)
GLUCOSE-CAPILLARY: 126 mg/dL — AB (ref 65–99)
Glucose-Capillary: 591 mg/dL (ref 65–99)
Glucose-Capillary: 569 mg/dL (ref 65–99)
Glucose-Capillary: 441 mg/dL — ABNORMAL HIGH (ref 65–99)
Glucose-Capillary: 146 mg/dL — ABNORMAL HIGH (ref 65–99)

## 2017-10-21 LAB — BLOOD GAS, ARTERIAL
Acid-base deficit: 20.9 mmol/L — ABNORMAL HIGH (ref 0.0–2.0)
BICARBONATE: 5.2 mmol/L — AB (ref 20.0–28.0)
Drawn by: 345601
O2 CONTENT: 4 L/min
O2 SAT: 98.4 %
Patient temperature: 98.6
pH, Arterial: 7.288 — ABNORMAL LOW (ref 7.350–7.450)
pO2, Arterial: 104 mmHg (ref 83.0–108.0)

## 2017-10-21 LAB — RAPID URINE DRUG SCREEN, HOSP PERFORMED
Amphetamines: NOT DETECTED
BARBITURATES: NOT DETECTED
BENZODIAZEPINES: NOT DETECTED
COCAINE: NOT DETECTED
OPIATES: NOT DETECTED
TETRAHYDROCANNABINOL: NOT DETECTED

## 2017-10-21 LAB — MAGNESIUM
Magnesium: 1.7 mg/dL (ref 1.7–2.4)
Magnesium: 1.8 mg/dL (ref 1.7–2.4)

## 2017-10-21 LAB — MRSA PCR SCREENING: MRSA BY PCR: NEGATIVE

## 2017-10-21 LAB — PHOSPHORUS
Phosphorus: 1.1 mg/dL — ABNORMAL LOW (ref 2.5–4.6)
Phosphorus: 2.1 mg/dL — ABNORMAL LOW (ref 2.5–4.6)

## 2017-10-21 LAB — PROCALCITONIN: Procalcitonin: 2.39 ng/mL

## 2017-10-21 MED ORDER — SODIUM CHLORIDE 0.9 % IV BOLUS (SEPSIS)
1000.0000 mL | Freq: Once | INTRAVENOUS | Status: AC
  Administered 2017-10-21: 1000 mL via INTRAVENOUS

## 2017-10-21 MED ORDER — POTASSIUM PHOSPHATES 15 MMOLE/5ML IV SOLN
30.0000 mmol | Freq: Once | INTRAVENOUS | Status: DC
  Filled 2017-10-21: qty 10

## 2017-10-21 MED ORDER — IBUPROFEN 100 MG/5ML PO SUSP
400.0000 mg | Freq: Once | ORAL | Status: AC
  Administered 2017-10-21: 400 mg via ORAL
  Filled 2017-10-21 (×2): qty 20

## 2017-10-21 MED ORDER — POTASSIUM CHLORIDE 10 MEQ/100ML IV SOLN
10.0000 meq | INTRAVENOUS | Status: AC
  Administered 2017-10-21 (×4): 10 meq via INTRAVENOUS
  Filled 2017-10-21 (×4): qty 100

## 2017-10-21 MED ORDER — INSULIN ASPART 100 UNIT/ML ~~LOC~~ SOLN
0.0000 [IU] | SUBCUTANEOUS | Status: DC
  Administered 2017-10-21 – 2017-10-22 (×3): 2 [IU] via SUBCUTANEOUS
  Administered 2017-10-22: 8 [IU] via SUBCUTANEOUS

## 2017-10-21 MED ORDER — RISPERIDONE 1 MG/ML PO SOLN
2.0000 mg | Freq: Every day | ORAL | Status: DC
  Administered 2017-10-21: 2 mg via ORAL
  Filled 2017-10-21 (×2): qty 2

## 2017-10-21 MED ORDER — INSULIN DETEMIR 100 UNIT/ML ~~LOC~~ SOLN
30.0000 [IU] | Freq: Every day | SUBCUTANEOUS | Status: DC
  Administered 2017-10-21 – 2017-10-23 (×3): 30 [IU] via SUBCUTANEOUS
  Filled 2017-10-21 (×4): qty 0.3

## 2017-10-21 MED ORDER — POTASSIUM PHOSPHATES 15 MMOLE/5ML IV SOLN
30.0000 mmol | Freq: Once | INTRAVENOUS | Status: AC
  Administered 2017-10-21: 30 mmol via INTRAVENOUS
  Filled 2017-10-21: qty 10

## 2017-10-21 MED ORDER — MAGNESIUM SULFATE 2 GM/50ML IV SOLN
2.0000 g | Freq: Once | INTRAVENOUS | Status: AC
  Administered 2017-10-21: 2 g via INTRAVENOUS
  Filled 2017-10-21: qty 50

## 2017-10-21 MED ORDER — MIDAZOLAM HCL 2 MG/2ML IJ SOLN
INTRAMUSCULAR | Status: AC
  Administered 2017-10-21: 04:00:00
  Filled 2017-10-21: qty 2

## 2017-10-21 NOTE — Progress Notes (Signed)
eLink Physician-Brief Progress Note Patient Name: [name] DOB: August 06, 1986 MRN: [id number]   Date of Service  10/21/2017  HPI/Events of Note  Lactic Acid = 2.7.  eICU Interventions  Will bolus with 0.9 NaCl 1 liter over 1 hour now.      Intervention Category Major Interventions: Acid-Base disturbance - evaluation and management  Anniah Glick Eugene 10/21/2017, 5:01 AM

## 2017-10-21 NOTE — Progress Notes (Signed)
Earlier this morning patient self extubated despite being in restraints. I examined him at that time (was unable to leave note then due to EMR downtime) and found patient to have improved from his previously terrible mental status (pupils fixed/dilated, no corneals, no cough, no gag) to now sleepy but awakens to voice, says "hmm?" in response to questions, intermittently obeying commands, pupils still dilated equally but sluggishly reactive now. On exam RR 24 with no stridor or accessory muscle use. Lungs CTA b/l. Seemed stable on nasal cannula; no need to reintubate at that time.   Now notice patient to have Hypokalemia, Hypophosphatemia, Hypomagnesemia on morning labs. Repleted with KPhos and 2g Magnesium Sulfate. ABG shows pH 7.28. Will stop Bicarb gtt now.

## 2017-10-21 NOTE — Progress Notes (Signed)
Critical lab reported Ph 7.31, COc<15, per MD summers continue bicarb drip reassess ABG's at 0500.

## 2017-10-21 NOTE — Progress Notes (Signed)
Pt is progressing from previous status.  [employer] (was referred upon admittion) representative came, completed documentation, placed in the chart.  VS stable. Insulin drip and Levo drips were stopped. Drowsy but oriented to self, place. Pt is reoriented often. Most Labs are improving.  Skin assessment:  Previously Stage III heal on Lt heal. Seems healing granulation tissue noted. Pink and tender. Stage I, Non blanchable pressure injury (sacrum and RT heal/ankle) reported from previous RN, noted and documented. Ulcer with redness and swelling noted on the penis, documented. MD aware.  Mother called and updated, education provided, questions answered.

## 2017-10-21 NOTE — Progress Notes (Signed)
eLink Physician-Brief Progress Note Patient Name: [name] DOB: [birth year] MRN: [id number]   Date of Service  10/21/2017  HPI/Events of Note  Mutiple issues: 1. Agitation - on Risperdol 2 mg Q HS at home  and 2. Patient c/o pain - Creatinine = 1.69.  eICU Interventions  Will order: 1. Motrin Liquid 400 mg PO X 1.  2. Risperdol Liquid 2 mg PO Q HS.     Intervention Category Intermediate Interventions: Pain - evaluation and management Minor Interventions: Agitation / anxiety - evaluation and management  Sommer,Steven Eugene 10/21/2017, 9:45 PM

## 2017-10-21 NOTE — Progress Notes (Signed)
Pt. Awake and following commands but agitated and trying to self extubate and pulling on lines, NP and elink notified new orders pending. Restraints applied for safety.

## 2017-10-21 NOTE — Progress Notes (Signed)
Pt self extubated while restrained and medicated with 1 mg of versed. No adverse effects from extubation, MD aware new orders placed pt is on 5L [place] and tolerating well. Safety maintained will continue to monitor and assess.

## 2017-10-21 NOTE — Progress Notes (Signed)
CRITICAL VALUE ALERT  Critical Value:  K+ 2.7  Date & Time Notied:  10/21/2017 @ 0920  Provider Notified: Dr. Molli Knock

## 2017-10-21 NOTE — H&P (Signed)
PULMONARY / CRITICAL CARE MEDICINE   Name: [name] MRN: [id number] DOB: September 10, 1986    ADMISSION DATE:  10/20/2017 CONSULTATION DATE:  10/20/2017  REFERRING MD:  Dr. Eudelia Bunch   CHIEF COMPLAINT:  Unresponsive   HISTORY OF PRESENT ILLNESS: Information obtained from medical record as patient is intubated and unresponsive   31 year old male with PMH of Type 1 DM and schizophrenia, recent multiple DKA admissions due to medical non-compliance. Admitted 10/24-10/27 with DKA, at this time Psychiatry was consulted however patient refused to cooperate with consult.   11/3 patient was found down at home by roommate with unknown down time. When EMS arrived patient was unresponsive, cold to touch, and glucose level  "high". Upon arrival to ED temp 85 rectal, HR 58, BP 60/14. Patient intubated. PCCM consulted for admission.   SUBJECTIVE:  Self extubated earlier today, remains on pressors, no new complaints  VITAL SIGNS: BP (!) 103/57   Pulse (!) 116   Temp 99.7 F (37.6 C)   Resp 16   Ht 6\' 1"  (1.854 m)   Wt 67.2 kg (148 lb 2.4 oz)   SpO2 100%   BMI 19.55 kg/m   HEMODYNAMICS:    VENTILATOR SETTINGS: Vent Mode: PRVC FiO2 (%):  [40 %-100 %] 40 % Set Rate:  [16 bmp-20 bmp] 20 bmp Vt Set:  [600 mL] 600 mL PEEP:  [5 cmH20] 5 cmH20 Plateau Pressure:  [12 cmH20-15 cmH20] 15 cmH20  INTAKE / OUTPUT: I/O last 3 completed shifts: In: 5742.1 [I.V.:1675.4; IV Piggyback:4066.7] Out: 390 [Urine:360; Emesis/NG output:30]  PHYSICAL EXAMINATION: General:  Adult male, alert and interactive Neuro:  Alert and interactive, moving all ext to command HEENT:  [place]/AT, PERRL, EOM-I and MMM Cardiovascular:  RRR, no MRG  Lungs:  CTA bilaterally Abdomen:  Active bowel sounds, non-distended Musculoskeletal:  -edema  Skin:  Cold to touch  LABS:  BMET Recent Labs  Lab 10/20/17 2055 10/20/17 2350 10/21/17 0513  NA 125* 131* 138  K 6.2* 5.2* 3.3*  CL 103 104 109  CO2 <7* <7* 8*  BUN 20 23*  23*  CREATININE 1.94* 2.27* 2.20*  GLUCOSE 809* 761* 459*   Electrolytes Recent Labs  Lab 10/20/17 2055 10/20/17 2350 10/21/17 0513  CALCIUM 6.6* 7.5* 7.5*  MG 2.2  --  1.7  PHOS 7.9*  --  1.1*   CBC Recent Labs  Lab 10/20/17 2256 10/21/17 0515  WBC 49.1* 22.7*  HGB 10.3* 9.0*  HCT 33.3* 26.4*  PLT 684* 391   Coag's No results for input(s): APTT, INR in the last 168 hours.  Sepsis Markers Recent Labs  Lab 10/20/17 2059 10/20/17 2350 10/21/17 0413 10/21/17 0513  LATICACIDVEN 2.65* 2.0* 2.7*  --   PROCALCITON  --   --   --  2.39   ABG Recent Labs  Lab 10/21/17 0335  PHART 7.288*  PCO2ART VALUE BELOW REPORTABLE RANGE  PO2ART 104   Liver Enzymes No results for input(s): AST, ALT, ALKPHOS, BILITOT, ALBUMIN in the last 168 hours.  Cardiac Enzymes No results for input(s): TROPONINI, PROBNP in the last 168 hours.  Glucose Recent Labs  Lab 10/21/17 0107 10/21/17 0224 10/21/17 0418 10/21/17 0545 10/21/17 0632 10/21/17 0803  GLUCAP 591* 569* 441* 414* 251* 265*    Imaging Ct Head Wo Contrast  Result Date: 10/20/2017 CLINICAL DATA:  Found down at home, unresponsive. EXAM: CT HEAD WITHOUT CONTRAST TECHNIQUE: Contiguous axial images were obtained from the base of the skull through the vertex without intravenous contrast.  COMPARISON:  CT HEAD August 17, 2017 FINDINGS: BRAIN: No intraparenchymal hemorrhage, mass effect nor midline shift. The ventricles and sulci are normal. No acute large vascular territory infarcts. Subcentimeter hypodensity RIGHT basal ganglia is unchanged. No abnormal extra-axial fluid collections. Basal cisterns are patent. VASCULAR: Unremarkable. SKULL/SOFT TISSUES: No skull fracture. No significant soft tissue swelling. Small residual LEFT posterior scalp hematoma. ORBITS/SINUSES: The included ocular globes and orbital contents are normal.The mastoid aircells and included paranasal sinuses are well-aerated. OTHER: Included face demonstrates  large cystic appearing structure LEFT oral cavity contiguous with maxilla. Poor dentition. Life-support lines in place. IMPRESSION: 1. No acute intracranial process . 2. 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 3. Large cystic structure within the LEFT oral cavity (possibly edematous tongue), possible odontogenic given poor dentition. Recommend direct inspection and, CT face with contrast as clinically today. Electronically Signed   By: Awilda Metro M.D.   On: 10/20/2017 23:47   Dg Chest Port 1 View  Result Date: 10/21/2017 CLINICAL DATA:  31 year old male admitted with diabetic ketoacidosis, bradycardia, hypotension. EXAM: PORTABLE CHEST 1 VIEW COMPARISON:  10/20/2017 and earlier. FINDINGS: Portable AP semi upright view at 0427 hours. Extubated and enteric tube removed. Stable right IJ central line. Normal cardiac size and mediastinal contours. Allowing for portable technique the left lung remains clear, but there is new patchy and confluent right infrahilar and lung base opacity. No pneumothorax or pleural effusion. Negative visible bowel gas pattern. IMPRESSION: 1. Extubated and enteric tube removed. Stable right IJ central line. 2. New confluent right lung base opacity. Top differential considerations are Aspiration and Pneumonia. 3. No pleural effusion. Electronically Signed   By: Odessa Fleming M.D.   On: 10/21/2017 07:05   Dg Chest Portable 1 View  Result Date: 10/20/2017 CLINICAL DATA:  Initial evaluation for central line placement. EXAM: PORTABLE CHEST 1 VIEW COMPARISON:  Prior radiograph from 10/11/2017. FINDINGS: Endotracheal tube in place with tip positioned 7.8 cm above the carina. Right IJ approach centra venous catheter in place with tip overlying the proximal right atrium. Enteric tube courses into the abdomen. Cardiac mediastinal silhouettes within normal limits. Lungs hypoinflated. No focal infiltrates. No pulmonary edema or pleural effusion. No appreciable  pneumothorax, although the right lung apex is incompletely visualized. No acute osseus abnormality. IMPRESSION: 1. Right IJ approach centra venous catheter tip overlying the proximal right atrium. 2. Tip of endotracheal tube approximately 7.7 cm above the carina. 3. No radiographic evidence for active cardiopulmonary disease. Electronically Signed   By: Rise Mu M.D.   On: 10/20/2017 22:48   Dg Abd Portable 1 View  Result Date: 10/20/2017 CLINICAL DATA:  Patient status post OG tube placement. EXAM: PORTABLE ABDOMEN - 1 VIEW COMPARISON:  Abdominal radiograph 08/04/2017. FINDINGS: Lung bases are clear. Enteric tube tip and side-port project over the stomach. Nonobstructed bowel gas pattern. IMPRESSION: Enteric tube tip and side-port project over the stomach. Electronically Signed   By: Annia Belt M.D.   On: 10/20/2017 21:38     STUDIES:  KUB 11/3 > Lung bases clear, enteric tube tip and side port project over the stomach  CT Head 11/3 >>  CXR 11/3 > 1. Right IJ approach centra venous catheter tip overlying the proximal right atrium. 2. Tip of endotracheal tube approximately 7.7 cm above the carina. 3. No radiographic evidence for active cardiopulmonary disease.  CULTURES: Blood 11/3 >> U/A 11/3 >> Sputum 11/3 >>   ANTIBIOTICS: None.   SIGNIFICANT EVENTS: 11/3 > Presents to ED  LINES/TUBES: ETT 11/3 >>   DISCUSSION: 31 year old with 1DM and medical non-compliance, presents to ED after being found unresponsive for unknown time. Intubated upon arrival.   ASSESSMENT / PLAN:  PULMONARY A: Acute Hypoxic Respiratory Distress  CXR > No acute  P:   Titrate O2 for sat of 88-92% IS Ambulate when off pressors  CARDIOVASCULAR A:  Hypotension in setting of severe dehydration and DKA  Troponin 0.01  H/O HTN  P:  Cardiac Monitoring  Maintain MAP >65  Levophed for BP support  RENAL A:   Anion-Gap Metabolic Acidosis  AKI s/p 5L NS (Base Crt 0.6-0.8)  Crt 1.94 >>   P:   Trend BMP  Replace electrolytes as indicated  Trend LA  Change bicarb drip to NS  GASTROINTESTINAL A:   GERD  P:   Diabetic diet PPI   HEMATOLOGIC A:   Anemia  P:  Trend CBC  Heparin SQ for VTE SCDs  Transfuse for Hbg <7   INFECTIOUS A:   Leukocytosis  WBC >> LA >> Chronic Left Diabetic Foot Ulcer  P:   Trend WBC and Fever Curve  PAN Culture  Monitor off Antibiotics   ENDOCRINE A:   DKA H/O Noncompliant 1DM    P:   DKA Protocol  CBGs ISS when DKA protocol is off Diabetic educator in AM  NEUROLOGIC A:   Metabolic Encephalopathy vs Anoxic Injury  UDS >> ETOH <10 H/O Schizophrenia, Depression  P:   RASS goal: 0 Monitor  Hold home Trazodone and Risperdal  Avoid Sedative Medications  Head CT nothing acute EEG ordered   FAMILY  - Updates: No family bedside  - Inter-disciplinary family meet or Palliative Care meeting due by:  10/27/2017  The patient is critically ill with multiple organ systems failure and requires high complexity decision making for assessment and support, frequent evaluation and titration of therapies, application of advanced monitoring technologies and extensive interpretation of multiple databases.   Critical Care Time devoted to patient care services described in this note is  35  Minutes. This time reflects time of care of this signee Dr Koren Bound. This critical care time does not reflect procedure time, or teaching time or supervisory time of PA/NP/Med student/Med Resident etc but could involve care discussion time.  Alyson Reedy, M.D. [facility] Pulmonary/Critical Care Medicine. Pager: [phone]. After hours pager: [phone].

## 2017-10-22 ENCOUNTER — Inpatient Hospital Stay (HOSPITAL_COMMUNITY): Payer: Medicare Other

## 2017-10-22 DIAGNOSIS — E0811 Diabetes mellitus due to underlying condition with ketoacidosis with coma: Secondary | ICD-10-CM

## 2017-10-22 LAB — POCT I-STAT 3, ART BLOOD GAS (G3+)
Acid-base deficit: <30 mmol/L — ABNORMAL HIGH (ref 0.0–2.0)
Acid-base deficit: <30 mmol/L — ABNORMAL HIGH (ref 0.0–2.0)
Bicarbonate: 1.3 mmol/L — ABNORMAL LOW (ref 20.0–28.0)
Bicarbonate: 2.1 mmol/L — ABNORMAL LOW (ref 20.0–28.0)
Bicarbonate: 3.9 mmol/L — ABNORMAL LOW (ref 20.0–28.0)
O2 SAT: 100 %
O2 Saturation: 92 %
O2 Saturation: 96 %
PO2 ART: 637 mmHg — AB (ref 83.0–108.0)
TCO2: <5 mmol/L — ABNORMAL LOW (ref 22–32)
TCO2: <5 mmol/L — ABNORMAL LOW (ref 22–32)
pCO2 arterial: 15.6 mmHg — CL (ref 32.0–48.0)
pCO2 arterial: 27.3 mmHg — ABNORMAL LOW (ref 32.0–48.0)
pCO2 arterial: 28.8 mmHg — ABNORMAL LOW (ref 32.0–48.0)
pH, Arterial: 6.5 — CL (ref 7.350–7.450)
pH, Arterial: 6.524 — CL (ref 7.350–7.450)
pH, Arterial: 6.736 — CL (ref 7.350–7.450)
pO2, Arterial: 159 mmHg — ABNORMAL HIGH (ref 83.0–108.0)
pO2, Arterial: 209 mmHg — ABNORMAL HIGH (ref 83.0–108.0)

## 2017-10-22 LAB — GLUCOSE, CAPILLARY
Glucose-Capillary: 203 mg/dL — ABNORMAL HIGH (ref 65–99)
Glucose-Capillary: 136 mg/dL — ABNORMAL HIGH (ref 65–99)
Glucose-Capillary: 225 mg/dL — ABNORMAL HIGH (ref 65–99)
Glucose-Capillary: 119 mg/dL — ABNORMAL HIGH (ref 65–99)
Glucose-Capillary: 119 mg/dL — ABNORMAL HIGH (ref 65–99)
Glucose-Capillary: 114 mg/dL — ABNORMAL HIGH (ref 65–99)
Glucose-Capillary: 100 mg/dL — ABNORMAL HIGH (ref 65–99)

## 2017-10-22 LAB — CBC
HEMATOCRIT: 22.1 % — AB (ref 39.0–52.0)
Hemoglobin: 7.9 g/dL — ABNORMAL LOW (ref 13.0–17.0)
MCH: 29.7 pg (ref 26.0–34.0)
MCHC: 35.7 g/dL (ref 30.0–36.0)
MCV: 83.1 fL (ref 78.0–100.0)
Platelets: 202 10*3/uL (ref 150–400)
RBC: 2.66 MIL/uL — ABNORMAL LOW (ref 4.22–5.81)
RDW: 15.1 % (ref 11.5–15.5)
WBC: 8.8 10*3/uL (ref 4.0–10.5)

## 2017-10-22 LAB — PROCALCITONIN: PROCALCITONIN: 1.43 ng/mL

## 2017-10-22 LAB — BASIC METABOLIC PANEL
Anion gap: 6 (ref 5–15)
BUN: 9 mg/dL (ref 6–20)
CALCIUM: 7.4 mg/dL — AB (ref 8.9–10.3)
CO2: 20 mmol/L — AB (ref 22–32)
CREATININE: 0.9 mg/dL (ref 0.61–1.24)
Chloride: 111 mmol/L (ref 101–111)
GFR calc Af Amer: >60 mL/min (ref >60)
GFR calc non Af Amer: >60 mL/min (ref >60)
GLUCOSE: 241 mg/dL — AB (ref 65–99)
Potassium: 3.6 mmol/L (ref 3.5–5.1)
Sodium: 137 mmol/L (ref 135–145)

## 2017-10-22 LAB — MAGNESIUM
Magnesium: 2.1 mg/dL (ref 1.7–2.4)
Magnesium: 2.1 mg/dL (ref 1.7–2.4)

## 2017-10-22 LAB — PHOSPHORUS: Phosphorus: 3 mg/dL (ref 2.5–4.6)

## 2017-10-22 MED ORDER — PENICILLIN G BENZATHINE 1200000 UNIT/2ML IM SUSP
2.4000 10*6.[IU] | Freq: Once | INTRAMUSCULAR | Status: AC
Start: 2017-10-22 — End: 2017-10-22
  Administered 2017-10-22: 2.4 10*6.[IU] via INTRAMUSCULAR
  Filled 2017-10-22: qty 4

## 2017-10-22 MED ORDER — INSULIN ASPART 100 UNIT/ML ~~LOC~~ SOLN
0.0000 [IU] | Freq: Three times a day (TID) | SUBCUTANEOUS | Status: DC
  Administered 2017-10-23: 15 [IU] via SUBCUTANEOUS

## 2017-10-22 MED ORDER — POTASSIUM CHLORIDE 10 MEQ/100ML IV SOLN
10.0000 meq | INTRAVENOUS | Status: AC
  Administered 2017-10-22 (×3): 10 meq via INTRAVENOUS
  Filled 2017-10-22: qty 100

## 2017-10-22 MED ORDER — INSULIN ASPART 100 UNIT/ML ~~LOC~~ SOLN: 0.0000 [IU] | Freq: Every day | SUBCUTANEOUS | Status: DC

## 2017-10-22 MED ORDER — PANTOPRAZOLE SODIUM 40 MG PO TBEC
40.0000 mg | DELAYED_RELEASE_TABLET | Freq: Every day | ORAL | Status: DC
  Administered 2017-10-22: 40 mg via ORAL
  Filled 2017-10-22: qty 1

## 2017-10-22 MED ORDER — TRAZODONE HCL 50 MG PO TABS
150.0000 mg | ORAL_TABLET | Freq: Every day | ORAL | Status: DC
  Administered 2017-10-22: 150 mg via ORAL
  Filled 2017-10-22: qty 1

## 2017-10-22 MED ORDER — MAGNESIUM SULFATE 2 GM/50ML IV SOLN
2.0000 g | Freq: Once | INTRAVENOUS | Status: AC
  Administered 2017-10-22: 2 g via INTRAVENOUS
  Filled 2017-10-22: qty 50

## 2017-10-22 MED ORDER — ACETAMINOPHEN 325 MG PO TABS
650.0000 mg | ORAL_TABLET | Freq: Four times a day (QID) | ORAL | Status: DC | PRN
  Administered 2017-10-22 – 2017-10-23 (×2): 650 mg via ORAL
  Filled 2017-10-22 (×2): qty 2

## 2017-10-22 MED ORDER — POTASSIUM PHOSPHATES 15 MMOLE/5ML IV SOLN
30.0000 mmol | Freq: Once | INTRAVENOUS | Status: AC
  Administered 2017-10-22: 30 mmol via INTRAVENOUS
  Filled 2017-10-22: qty 10

## 2017-10-22 NOTE — Progress Notes (Addendum)
Admission note:  Arrival Method: Patient arrived in w/c from x-ray. Mental Orientation: Alert and oriented x 4. Telemetry: N/A Assessment: see doc flow sheets. Skin: stage 1 on the sacrum, healing stage 3 on the left heel, scab on penis. IV: Left AC saline lock. Pain: 6/10, will administer Tylenol 650 mg. Tubes: N/A Safety Measures: Bed in low position, call bell and phone within reach. Fall Prevention Safety Plan: Reviewed the plan, verbalized understanding. Admission Screening: In progress. 6700 Orientation: Patient has been oriented to the unit, staff and to the room.

## 2017-10-22 NOTE — Progress Notes (Signed)
Attempted report nurse to call back. Huntley Estelle E, RN 10/22/2017 12:00 PM

## 2017-10-22 NOTE — Progress Notes (Addendum)
[place] Pulmonary & Critical Care Attending Note  ADMISSION DATE:  10/20/2017  CONSULTATION DATE:  10/20/2017  REFERRING MD:  Addison Lank, M.D. / EDP  CHIEF COMPLAINT:  Unresponsive   Presenting HPI:  31 y.o. male with known history of type 1 diabetes mellitus and schizophrenia. Admitted multiple times with diabetic ketoacidosis presumed to be secondary to medical nonadherence. Patient was subsequently found down at home by roommate with unknown down time. She was reportedly unresponsive, cold to the touch hand with a glucose level read as "high". Upon EMS arrival patient was 50F rectal temperature with heart rate 58 and blood pressure 60/14. He was subsequently intubated.  Subjective:  Patient weaned off vasopressor support yesterday. Patient also self extubated yesterday. Denies any abdominal pain or nausea. He is endorsing some right-sided shoulder pain as well as pain in his penis/scrotal area.  Review of Systems:  No chest pain or pressure. No dyspnea or cough.  Temp:  [98.4 F (36.9 C)-99.5 F (37.5 C)] 98.4 F (36.9 C) (11/05 1301) Pulse Rate:  [101-127] 104 (11/05 1301) Resp:  [16] 16 (11/04 2000) BP: (81-128)/(51-87) 128/87 (11/05 1301) SpO2:  [97 %-100 %] 98 % (11/05 0700) Weight:  [159 lb 2.8 oz (72.2 kg)] 159 lb 2.8 oz (72.2 kg) (11/05 0530)   General:  No family at bedside. Awake. Alert. Integument:  Warm & dry. No rash on exposed skin. Ulcer present on dorsal aspect of penis. HEENT:  Moist mucus memebranes. No scleral icterus. Poor dentition. Neurological:  Pupils symmetric. Oriented 4. Grossly nonfocal. Musculoskeletal:  No joint effusion or erythema appreciated. Symmetric muscle bulk. No deformity to her right shoulder. Pulmonary:  Normal work of breathing. Clear to auscultation. Cardiovascular:  Regular rate & rhythm. No appreciable JVD. No edema. Abdomen:  Soft. Nondistended. Normoactive bowel sounds. Nontender.  LINES/TUBES: OETT 11/3 - 11/4 (self  extubated) Foley 11/3 - 11/5 OGT 11/3 - 11/4 PIV  CBC Latest Ref Rng & Units 10/22/2017 10/21/2017 10/20/2017  WBC 4.0 - 10.5 K/uL 8.8 22.7(H) 49.1(H)  Hemoglobin 13.0 - 17.0 g/dL 7.9(L) 9.0(L) 10.3(L)  Hematocrit 39.0 - 52.0 % 22.1(L) 26.4(L) 33.3(L)  Platelets 150 - 400 K/uL 202 391 684(H)   BMP Latest Ref Rng & Units 10/22/2017 10/21/2017 10/21/2017  Glucose 65 - 99 mg/dL 241(H) 129(H) 286(H)  BUN 6 - 20 mg/dL _0 Creatinine 0.61 - 1.24 mg/dL 0.90 0.90 1.68(H)  Sodium 135 - 145 mmol/L 137 139 142  Potassium 3.5 - 5.1 mmol/L 3.6 3.0(L) 2.7(LL)  Chloride 101 - 111 mmol/L 111 116(H) 115(H)  CO2 22 - 32 mmol/L 20(L) 20(L) 16(L)  Calcium 8.9 - 10.3 mg/dL 7.4(L) 7.6(L) 7.6(L)   Hepatic Function Latest Ref Rng & Units 10/13/2017 09/14/2017 09/13/2017  Total Protein 6.5 - 8.1 g/dL 4.1(L) 5.0(L) 6.3(L)  Albumin 3.5 - 5.0 g/dL 2.3(L) 3.0(L) 3.5  AST 15 - 41 U/L _1 ALT 17 - 63 U/L 10(L) 21 25  Alk Phosphatase 38 - 126 U/L 80 78 102  Total Bilirubin 0.3 - 1.2 mg/dL 0.2(L) 0.8 2.6(H)  Bilirubin, Direct 0.1 - 0.5 mg/dL - 0.1 -    IMAGING/STUDIES: UDS 11/3:  Negative  CT HEAD W/O 11/3:  No acute intracranial process. 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 Large cystic structure within the LEFT oral cavity (possibly edematous tongue), possible odontogenic given poor dentition. Recommend direct inspection and, CT face with contrast as clinically today. R SHOULDER X-RAY 11/5:  There is no acute or significant  chronic bony abnormality of the right shoulder.  MICROBIOLOGY: MRSA PCR 11/4:  Negative  Blood Cultures x2 11/3 >>> RPR 11/5 >>> HIV 11/5 >>>  ANTIBIOTICS: PCN IM x1 (11/5)  SIGNIFICANT EVENTS: 11/03 - Admit after found down & hypothermic 11/04 - Self-extubated & weaned off pressors  ASSESSMENT/PLAN:  31 y.o. male with known history of diabetes mellitus type 1 presenting with hypothermia and altered mentation. Patient in a state of diabetic  ketoacidosis. Mentation has markedly improved. Patient remains normotensive and off vasopressor infusion. Additionally, the patient has been able to maintain off insulin infusion.  1. DKA: Resolved. Continuing Levemir 30 units subcutaneous daily & sliding scale insulin with Accu-Cheks every before meals & at bedtime. 2. Hypotension: Resolved. Likely due to severe dehydration. Monitoring vitals per unit protocol. 3. Acute renal failure: Resolving. Likely secondary to volume depletion. Monitoring electrolytes and renal function daily. Discontinuing Foley catheter. 4. Possible sepsis: Cultures pending. Trending Procalcitonin problem rhythm. Treating for possible syphilis with penicillin IM. Sending serum RPR today. 5. Chronic left diabetic foot ulcer: Monitoring off antibiotics for now. 6. Penile ulcer: Suspect possible syphilis. Treating with penicillin IM. 7. Acute encephalopathy: Multifactorial. Likely toxic metabolic. Holding on further sedatives. 8. History of schizophrenia/depression: Continuing home Resporal. Restarting home trazodone. Avoiding additional sedatives. Pharmacy investigating home Adderall dose. Patient reports he is not consistently on this medication. 9. Anemia: No signs of active bleeding. Trending cell counts daily with CBC. 10. Essential hypertension: Monitoring vitals per protocol. Not currently on any home antihypertensive medications.  Prophylaxis: Protonix by mouth daily at bedtime, heparin subcutaneous every 8 hours, & SCDs. Diet:  Carbohydrate modified diet. Code Status:  Full code per previous physician discussions. Disposition:  Transferring patient to telemetry unit. Consulting medical social worker for home health needs. Family Update:  Patient updated at the time of my exam.  I have spent a total of37 minutes of time today caring for the patient, reviewing the patient's electronic medical record, and with more than 50% of that time spent coordinating transfer of  care with the patient as well as reviewing the continuing plan of care with the patient at bedside.  TRH to assume care & PCCM off as of 11/6.  [name] Ashok Cordia, M.D. [facility] Pulmonary & Critical Care Pager:  [phone] After 7pm or if no response, call [phone] 1:40 PM 10/22/17

## 2017-10-22 NOTE — Progress Notes (Signed)
Patient refuses to wear O2 monitor on ear or hand as well as SCD. Patient has been educated on importance. Will monitor closely. Huntley Estelle E, RN 10/22/2017 8:31 AM

## 2017-10-23 DIAGNOSIS — Z9911 Dependence on respirator [ventilator] status: Secondary | ICD-10-CM

## 2017-10-23 DIAGNOSIS — G8929 Other chronic pain: Secondary | ICD-10-CM

## 2017-10-23 DIAGNOSIS — M25511 Pain in right shoulder: Secondary | ICD-10-CM

## 2017-10-23 LAB — CBC WITH DIFFERENTIAL/PLATELET
BASOS ABS: 0 10*3/uL (ref 0.0–0.1)
Basophils Relative: 0 %
EOS ABS: 0 10*3/uL (ref 0.0–0.7)
Eosinophils Relative: 1 %
HEMATOCRIT: 23.6 % — AB (ref 39.0–52.0)
HEMOGLOBIN: 8.3 g/dL — AB (ref 13.0–17.0)
Lymphocytes Relative: 22 %
Lymphs Abs: 1.3 10*3/uL (ref 0.7–4.0)
MCH: 29.9 pg (ref 26.0–34.0)
MCHC: 35.2 g/dL (ref 30.0–36.0)
MCV: 84.9 fL (ref 78.0–100.0)
MONOS PCT: 7 %
Monocytes Absolute: 0.4 10*3/uL (ref 0.1–1.0)
NEUTROS ABS: 4 10*3/uL (ref 1.7–7.7)
NEUTROS PCT: 70 %
Platelets: 202 10*3/uL (ref 150–400)
RBC: 2.78 MIL/uL — AB (ref 4.22–5.81)
RDW: 15 % (ref 11.5–15.5)
WBC: 5.7 10*3/uL (ref 4.0–10.5)

## 2017-10-23 LAB — RPR: RPR: NONREACTIVE

## 2017-10-23 LAB — HIV ANTIBODY (ROUTINE TESTING W REFLEX): HIV SCREEN 4TH GENERATION: NONREACTIVE

## 2017-10-23 LAB — RENAL FUNCTION PANEL
ANION GAP: 4 — AB (ref 5–15)
Albumin: 2.3 g/dL — ABNORMAL LOW (ref 3.5–5.0)
BUN: 7 mg/dL (ref 6–20)
CALCIUM: 7.9 mg/dL — AB (ref 8.9–10.3)
CHLORIDE: 115 mmol/L — AB (ref 101–111)
CO2: 24 mmol/L (ref 22–32)
CREATININE: 0.69 mg/dL (ref 0.61–1.24)
GFR calc Af Amer: >60 mL/min (ref >60)
GFR calc non Af Amer: >60 mL/min (ref >60)
Glucose, Bld: 100 mg/dL — ABNORMAL HIGH (ref 65–99)
Phosphorus: 2.6 mg/dL (ref 2.5–4.6)
Potassium: 3.5 mmol/L (ref 3.5–5.1)
SODIUM: 143 mmol/L (ref 135–145)

## 2017-10-23 LAB — GLUCOSE, CAPILLARY
GLUCOSE-CAPILLARY: 387 mg/dL — AB (ref 65–99)
GLUCOSE-CAPILLARY: 352 mg/dL — AB (ref 65–99)

## 2017-10-23 NOTE — Progress Notes (Signed)
Patient signed AMA, security was called to walk him down to his ride.

## 2017-10-23 NOTE — Progress Notes (Signed)
Patient refused to have bed alarm be turned on. Patient advised to call for assist before getting up. Call bell within reach. Anastasija Anfinson, Drinda Butts, Charity fundraiser

## 2017-10-25 NOTE — Discharge Summary (Signed)
Discharge Summary  [name] VOP:[id number] DOB: 22-Oct-1986  PCP: Fleet Contras, MD  Admit date: 10/20/2017 Discharge date: 10/23/2017  Time spent: >9mins  Recommendations for Outpatient Follow-up:  1. Signed AMA  Discharge Diagnoses:  Active Hospital Problems   Diagnosis Date Noted  . DKA (diabetic ketoacidoses) ([name]) 08/04/2017    Resolved Hospital Problems  No resolved problems to display.    Discharge Condition: Fair, signed out AMA  Diet recommendation: None  Vitals:   10/23/17 0413 10/23/17 0916  BP:  (!) 167/97  Pulse: (!) 102 (!) 105  Resp: 18 18  Temp: 98.2 F (36.8 C) 98.2 F (36.8 C)  SpO2: 100% 100%    History of present illness:  31 yr old male with known type 1 DM and schizophrenia admitted multiple times with DKA likely secondary to non-complinace. Pt was found unresponsive by roommate, BS read high, temp noted to be 8F, HR 58 and BP 60/14. Pt was admitted to the ICU and subequently intubated. On the 11/4, pt self extubated and on 11/6, was off pressors and insulin drip and pt was sent down to the unit.  Hospital Course:  Active Problems:   DKA (diabetic ketoacidoses) ([name])  # DKA Resolved On levemir 30U, SSI, accucheks Pt non-compliant and continually asking for juice. RBS noted to be 387, pt given basal and bolus insulin. Pt reported he wanted to be discharged despite his RBS. I personally advised pt not to, but pt refused and signed AMA.  # Hypotension Resolved  # AKI Resolved  # Acute encephalopathy Resolved Multifactorial  # Schizophrenia Continue home meds  # Hypertension Controlled  Procedures:  Intubation  Consultations:  Critical care  Discharge Exam: BP (!) 167/97 (BP Location: Left Arm)   Pulse (!) 105   Temp 98.2 F (36.8 C) (Oral)   Resp 18   Ht 6\' 1"  (1.854 m)   Wt 69 kg (152 lb 3.2 oz)   SpO2 100%   BMI 20.08 kg/m   General: Alert, awake, oriented X3 Cardiovascular: S1-S2 present, no added  hrt sound Respiratory: chest clear bilaterally  Discharge Instructions You were cared for by a hospitalist during your hospital stay. If you have any questions about your discharge medications or the care you received while you were in the hospital after you are discharged, you can call the unit and asked to speak with the hospitalist on call if the hospitalist that took care of you is not available. Once you are discharged, your primary care physician will handle any further medical issues. Please note that NO REFILLS for any discharge medications will be authorized once you are discharged, as it is imperative that you return to your primary care physician (or establish a relationship with a primary care physician if you do not have one) for your aftercare needs so that they can reassess your need for medications and monitor your lab values.   Allergies as of 10/23/2017      Reactions   Sulfa Antibiotics Other (See Comments)   Unknown childhood allergy   Fentanyl Other (See Comments)   Patient had hallucinations with the medication      Medication List    ASK your doctor about these medications   acetaminophen 500 MG tablet Commonly known as:  TYLENOL Take 1 tablet (500 mg total) by mouth every 6 (six) hours as needed.   amphetamine-dextroamphetamine 30 MG tablet Commonly known as:  ADDERALL Take 30 mg daily by mouth.   diclofenac 50 MG tablet Commonly  known as:  CATAFLAM Take 1 tablet (50 mg total) by mouth 2 (two) times daily as needed. For right shoulder pain   doxepin 10 MG/ML solution Commonly known as:  SINEQUAN Take 10 mLs by mouth at bedtime.   insulin aspart 100 UNIT/ML FlexPen Commonly known as:  NOVOLOG FLEXPEN Inject 0-15 Units into the skin See admin instructions. Check Blood Sugar 4 times per day > with meals and at bedtime CBG 70 - 120: 0 units CBG 121 - 150: 2 units CBG 151 - 200: 3 units CBG 201 - 250: 5 units CBG 251 - 300: 8 units CBG 301 - 350: 11  units CBG 351 - 400: 15 units   insulin detemir 100 UNIT/ML injection Commonly known as:  LEVEMIR Inject 0.3 mLs (30 Units total) into the skin daily.   metoCLOPramide 10 MG tablet Commonly known as:  REGLAN Take 1 tablet (10 mg total) by mouth 3 (three) times daily as needed for nausea or vomiting.   neomycin-bacitracin-polymyxin Oint Commonly known as:  NEOSPORIN Apply 1 application topically daily. Apply Neosporin to left heel daily, then cover with gauze and kerlex   pantoprazole 40 MG tablet Commonly known as:  PROTONIX Take 1 tablet (40 mg total) by mouth 2 (two) times daily before a meal.   sennosides-docusate sodium 8.6-50 MG tablet Commonly known as:  SENOKOT-S Take 1 tablet by mouth as needed for constipation.   traZODone 150 MG tablet Commonly known as:  DESYREL Take 150 mg by mouth at bedtime.      Allergies  Allergen Reactions  . Sulfa Antibiotics Other (See Comments)    Unknown childhood allergy  . Fentanyl Other (See Comments)    Patient had hallucinations with the medication      The results of significant diagnostics from this hospitalization (including imaging, microbiology, ancillary and laboratory) are listed below for reference.    Significant Diagnostic Studies: Dg Shoulder Right  Result Date: 10/22/2017 CLINICAL DATA:  Six months of right shoulder pain without known injury. History of diabetes and arthritis. EXAM: RIGHT SHOULDER - 2+ VIEW COMPARISON:  Right shoulder series of July 06, 2017 FINDINGS: The bones are subjectively adequately mineralized. There is no acute fracture or dislocation. The joint spaces are well maintained. The soft tissues are unremarkable. IMPRESSION: There is no acute or significant chronic bony abnormality of the right shoulder. Electronically Signed   By: David  SwazilandJordan M.D.   On: 10/22/2017 12:44   Ct Head Wo Contrast  Result Date: 10/20/2017 CLINICAL DATA:  Found down at home, unresponsive. EXAM: CT HEAD WITHOUT  CONTRAST TECHNIQUE: Contiguous axial images were obtained from the base of the skull through the vertex without intravenous contrast. COMPARISON:  CT HEAD August 17, 2017 FINDINGS: BRAIN: No intraparenchymal hemorrhage, mass effect nor midline shift. The ventricles and sulci are normal. No acute large vascular territory infarcts. Subcentimeter hypodensity RIGHT basal ganglia is unchanged. No abnormal extra-axial fluid collections. Basal cisterns are patent. VASCULAR: Unremarkable. SKULL/SOFT TISSUES: No skull fracture. No significant soft tissue swelling. Small residual LEFT posterior scalp hematoma. ORBITS/SINUSES: The included ocular globes and orbital contents are normal.The mastoid aircells and included paranasal sinuses are well-aerated. OTHER: Included face demonstrates large cystic appearing structure LEFT oral cavity contiguous with maxilla. Poor dentition. Life-support lines in place. IMPRESSION: 1. No acute intracranial process . 2. 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 3. Large cystic structure within the LEFT oral cavity (possibly edematous tongue), possible odontogenic given poor dentition. Recommend direct inspection  and, CT face with contrast as clinically today. Electronically Signed   By: Awilda Metro M.D.   On: 10/20/2017 23:47   Dg Esophagus  Result Date: 10/12/2017 CLINICAL DATA:  Dysphagia.  Difficulty swallowing. EXAM: ESOPHOGRAM/BARIUM SWALLOW TECHNIQUE: Single contrast examination was performed using  thin barium. FLUOROSCOPY TIME:  Fluoroscopy Time:  1 minutes 18 seconds Radiation Exposure Index (if provided by the fluoroscopic device): Number of Acquired Spot Images: 0 COMPARISON:  None. FINDINGS: Esophageal mucosa and motility normal. No stricture or mass lesion. Negative for hiatal hernia or reflux Barium tablet passed readily into the stomach without delay. IMPRESSION: Negative esophagram. Electronically Signed   By: Marlan Palau M.D.   On:  10/12/2017 13:57   Dg Chest Port 1 View  Result Date: 10/21/2017 CLINICAL DATA:  32 year old male admitted with diabetic ketoacidosis, bradycardia, hypotension. EXAM: PORTABLE CHEST 1 VIEW COMPARISON:  10/20/2017 and earlier. FINDINGS: Portable AP semi upright view at 0427 hours. Extubated and enteric tube removed. Stable right IJ central line. Normal cardiac size and mediastinal contours. Allowing for portable technique the left lung remains clear, but there is new patchy and confluent right infrahilar and lung base opacity. No pneumothorax or pleural effusion. Negative visible bowel gas pattern. IMPRESSION: 1. Extubated and enteric tube removed. Stable right IJ central line. 2. New confluent right lung base opacity. Top differential considerations are Aspiration and Pneumonia. 3. No pleural effusion. Electronically Signed   By: Odessa Fleming M.D.   On: 10/21/2017 07:05   Dg Chest Portable 1 View  Result Date: 10/20/2017 CLINICAL DATA:  Initial evaluation for central line placement. EXAM: PORTABLE CHEST 1 VIEW COMPARISON:  Prior radiograph from 10/11/2017. FINDINGS: Endotracheal tube in place with tip positioned 7.8 cm above the carina. Right IJ approach centra venous catheter in place with tip overlying the proximal right atrium. Enteric tube courses into the abdomen. Cardiac mediastinal silhouettes within normal limits. Lungs hypoinflated. No focal infiltrates. No pulmonary edema or pleural effusion. No appreciable pneumothorax, although the right lung apex is incompletely visualized. No acute osseus abnormality. IMPRESSION: 1. Right IJ approach centra venous catheter tip overlying the proximal right atrium. 2. Tip of endotracheal tube approximately 7.7 cm above the carina. 3. No radiographic evidence for active cardiopulmonary disease. Electronically Signed   By: Rise Mu M.D.   On: 10/20/2017 22:48   Dg Chest Port 1 View  Result Date: 10/11/2017 CLINICAL DATA:  31 year old male with diabetic  ketoacidosis. EXAM: PORTABLE CHEST 1 VIEW COMPARISON:  Chest radiograph dated 10/11/2017 FINDINGS: Left IJ central venous line with tip over the central SVC close to the cavoatrial junction. Mild prominence of the hilar vasculature. Small left infrahilar hazy density may represent atelectatic changes or crowding of the prominent vasculature although infiltrate is not excluded. Clinical correlation is recommended. PA and lateral views of the chest may provide better evaluation. There is no pleural effusion or pneumothorax. The cardiac silhouette is within normal limits. No acute osseous pathology. IMPRESSION: 1. Left IJ central venous catheter with tip close to the cavoatrial junction. No pneumothorax. 2. Patchy area of hazy density in the left infrahilar region may represent atelectasis or vascular crowding. Infiltrate not excluded. Clinical correlation is recommended. PA and lateral views of the chest may provide better evaluation. Electronically Signed   By: Elgie Collard M.D.   On: 10/11/2017 03:52   Dg Chest Port 1 View  Result Date: 10/11/2017 CLINICAL DATA:  32 year old male with sepsis. EXAM: PORTABLE CHEST 1 VIEW COMPARISON:  Chest radiograph dated 09/14/2017  FINDINGS: The heart size and mediastinal contours are within normal limits. Both lungs are clear. The visualized skeletal structures are unremarkable. IMPRESSION: No active disease. Electronically Signed   By: Elgie Collard M.D.   On: 10/11/2017 01:16   Dg Abd Portable 1 View  Result Date: 10/20/2017 CLINICAL DATA:  Patient status post OG tube placement. EXAM: PORTABLE ABDOMEN - 1 VIEW COMPARISON:  Abdominal radiograph 08/04/2017. FINDINGS: Lung bases are clear. Enteric tube tip and side-port project over the stomach. Nonobstructed bowel gas pattern. IMPRESSION: Enteric tube tip and side-port project over the stomach. Electronically Signed   By: Annia Belt M.D.   On: 10/20/2017 21:38    Microbiology: Recent Results (from the past 240  hour(s))  Culture, blood (routine x 2)     Status: None (Preliminary result)   Collection Time: 10/20/17 11:52 PM  Result Value Ref Range Status   Specimen Description BLOOD BLOOD RIGHT ARM  Final   Special Requests IN PEDIATRIC BOTTLE Blood Culture adequate volume  Final   Culture NO GROWTH 3 DAYS  Final   Report Status PENDING  Incomplete  Culture, blood (routine x 2)     Status: None (Preliminary result)   Collection Time: 10/20/17 11:55 PM  Result Value Ref Range Status   Specimen Description BLOOD BLOOD RIGHT HAND  Final   Special Requests   Final    BOTTLES DRAWN AEROBIC AND ANAEROBIC Blood Culture adequate volume   Culture NO GROWTH 3 DAYS  Final   Report Status PENDING  Incomplete  MRSA PCR Screening     Status: None   Collection Time: 10/21/17 12:06 AM  Result Value Ref Range Status   MRSA by PCR NEGATIVE NEGATIVE Final    Comment:        The GeneXpert MRSA Assay (FDA approved for NASAL specimens only), is one component of a comprehensive MRSA colonization surveillance program. It is not intended to diagnose MRSA infection nor to guide or monitor treatment for MRSA infections.      Labs: Basic Metabolic Panel: Recent Labs  Lab 10/20/17 2055  10/21/17 0513 10/21/17 0838 10/21/17 2250 10/22/17 0500 10/22/17 1040 10/23/17 0309  NA 125*   < > 138 142 139 137  --  143  K 6.2*   < > 3.3* 2.7* 3.0* 3.6  --  3.5  CL 103   < > 109 115* 116* 111  --  115*  CO2 <7*   < > 8* 16* 20* 20*  --  24  GLUCOSE 809*   < > 459* 286* 129* 241*  --  100*  BUN 20   < > 23* 19 12 9   --  7  CREATININE 1.94*   < > 2.20* 1.68* 0.90 0.90  --  0.69  CALCIUM 6.6*   < > 7.5* 7.6* 7.6* 7.4*  --  7.9*  MG 2.2  --  1.7  --  1.8 2.1 2.1  --   PHOS 7.9*  --  1.1*  --  2.1* 3.0  --  2.6   < > = values in this interval not displayed.   Liver Function Tests: Recent Labs  Lab 10/23/17 0309  ALBUMIN 2.3*   No results for input(s): LIPASE, AMYLASE in the last 168 hours. No results for  input(s): AMMONIA in the last 168 hours. CBC: Recent Labs  Lab 10/20/17 2256 10/21/17 0515 10/22/17 0500 10/23/17 0309  WBC 49.1* 22.7* 8.8 5.7  NEUTROABS 34.9*  --   --  4.0  HGB 10.3* 9.0* 7.9* 8.3*  HCT 33.3* 26.4* 22.1* 23.6*  MCV 95.1 85.4 83.1 84.9  PLT 684* 391 202 202   Cardiac Enzymes: No results for input(s): CKTOTAL, CKMB, CKMBINDEX, TROPONINI in the last 168 hours. BNP: BNP (last 3 results) No results for input(s): BNP in the last 8760 hours.  ProBNP (last 3 results) No results for input(s): PROBNP in the last 8760 hours.  CBG: Recent Labs  Lab 10/22/17 1258 10/22/17 1645 10/22/17 2159 10/23/17 0826 10/23/17 1144  GLUCAP 119* 114* 100* 387* 352*       Signed:  Briant Cedar, MD [facility] 10/25/2017, 9:21 AM

## 2017-10-26 LAB — CULTURE, BLOOD (ROUTINE X 2)
CULTURE: NO GROWTH
Culture: NO GROWTH
SPECIAL REQUESTS: ADEQUATE
Special Requests: ADEQUATE

## 2017-11-03 ENCOUNTER — Inpatient Hospital Stay (HOSPITAL_COMMUNITY): Payer: Medicare Other

## 2017-11-03 ENCOUNTER — Other Ambulatory Visit: Payer: Self-pay

## 2017-11-03 ENCOUNTER — Encounter (HOSPITAL_COMMUNITY): Payer: Self-pay | Admitting: Emergency Medicine

## 2017-11-03 ENCOUNTER — Inpatient Hospital Stay (HOSPITAL_COMMUNITY)
Admission: EM | Admit: 2017-11-03 | Discharge: 2017-11-06 | DRG: 638 | Discharge status: Against Medical Advice | Payer: Medicare Other | Attending: Internal Medicine | Admitting: Internal Medicine

## 2017-11-03 DIAGNOSIS — F419 Anxiety disorder, unspecified: Secondary | ICD-10-CM | POA: Diagnosis present

## 2017-11-03 DIAGNOSIS — Z818 Family history of other mental and behavioral disorders: Secondary | ICD-10-CM

## 2017-11-03 DIAGNOSIS — Z8701 Personal history of pneumonia (recurrent): Secondary | ICD-10-CM

## 2017-11-03 DIAGNOSIS — G8929 Other chronic pain: Secondary | ICD-10-CM | POA: Diagnosis present

## 2017-11-03 DIAGNOSIS — E10649 Type 1 diabetes mellitus with hypoglycemia without coma: Secondary | ICD-10-CM | POA: Diagnosis present

## 2017-11-03 DIAGNOSIS — D638 Anemia in other chronic diseases classified elsewhere: Secondary | ICD-10-CM | POA: Diagnosis present

## 2017-11-03 DIAGNOSIS — K219 Gastro-esophageal reflux disease without esophagitis: Secondary | ICD-10-CM | POA: Diagnosis present

## 2017-11-03 DIAGNOSIS — E871 Hypo-osmolality and hyponatremia: Secondary | ICD-10-CM | POA: Diagnosis present

## 2017-11-03 DIAGNOSIS — E876 Hypokalemia: Secondary | ICD-10-CM | POA: Diagnosis present

## 2017-11-03 DIAGNOSIS — R000 Tachycardia, unspecified: Secondary | ICD-10-CM | POA: Diagnosis present

## 2017-11-03 DIAGNOSIS — Z5321 Procedure and treatment not carried out due to patient leaving prior to being seen by health care provider: Secondary | ICD-10-CM | POA: Diagnosis present

## 2017-11-03 DIAGNOSIS — F17210 Nicotine dependence, cigarettes, uncomplicated: Secondary | ICD-10-CM | POA: Diagnosis present

## 2017-11-03 DIAGNOSIS — Z79899 Other long term (current) drug therapy: Secondary | ICD-10-CM | POA: Diagnosis not present

## 2017-11-03 DIAGNOSIS — E1110 Type 2 diabetes mellitus with ketoacidosis without coma: Secondary | ICD-10-CM | POA: Diagnosis present

## 2017-11-03 DIAGNOSIS — E1310 Other specified diabetes mellitus with ketoacidosis without coma: Secondary | ICD-10-CM | POA: Diagnosis not present

## 2017-11-03 DIAGNOSIS — F203 Undifferentiated schizophrenia: Secondary | ICD-10-CM | POA: Diagnosis present

## 2017-11-03 DIAGNOSIS — Z9114 Patient's other noncompliance with medication regimen: Secondary | ICD-10-CM | POA: Diagnosis not present

## 2017-11-03 DIAGNOSIS — E1010 Type 1 diabetes mellitus with ketoacidosis without coma: Secondary | ICD-10-CM | POA: Diagnosis not present

## 2017-11-03 DIAGNOSIS — I10 Essential (primary) hypertension: Secondary | ICD-10-CM | POA: Diagnosis present

## 2017-11-03 DIAGNOSIS — R450 Nervousness: Secondary | ICD-10-CM | POA: Diagnosis not present

## 2017-11-03 DIAGNOSIS — M25511 Pain in right shoulder: Secondary | ICD-10-CM | POA: Diagnosis present

## 2017-11-03 DIAGNOSIS — F1910 Other psychoactive substance abuse, uncomplicated: Secondary | ICD-10-CM | POA: Diagnosis present

## 2017-11-03 DIAGNOSIS — Z885 Allergy status to narcotic agent status: Secondary | ICD-10-CM

## 2017-11-03 DIAGNOSIS — Z833 Family history of diabetes mellitus: Secondary | ICD-10-CM

## 2017-11-03 DIAGNOSIS — Z9119 Patient's noncompliance with other medical treatment and regimen: Secondary | ICD-10-CM

## 2017-11-03 DIAGNOSIS — R0602 Shortness of breath: Secondary | ICD-10-CM

## 2017-11-03 DIAGNOSIS — F329 Major depressive disorder, single episode, unspecified: Secondary | ICD-10-CM | POA: Diagnosis present

## 2017-11-03 DIAGNOSIS — E861 Hypovolemia: Secondary | ICD-10-CM | POA: Diagnosis present

## 2017-11-03 DIAGNOSIS — Z794 Long term (current) use of insulin: Secondary | ICD-10-CM

## 2017-11-03 DIAGNOSIS — Z882 Allergy status to sulfonamides status: Secondary | ICD-10-CM

## 2017-11-03 DIAGNOSIS — T68XXXA Hypothermia, initial encounter: Secondary | ICD-10-CM

## 2017-11-03 DIAGNOSIS — I4510 Unspecified right bundle-branch block: Secondary | ICD-10-CM | POA: Diagnosis present

## 2017-11-03 DIAGNOSIS — E875 Hyperkalemia: Secondary | ICD-10-CM | POA: Diagnosis present

## 2017-11-03 DIAGNOSIS — R197 Diarrhea, unspecified: Secondary | ICD-10-CM | POA: Diagnosis present

## 2017-11-03 DIAGNOSIS — M2550 Pain in unspecified joint: Secondary | ICD-10-CM | POA: Diagnosis not present

## 2017-11-03 DIAGNOSIS — F1210 Cannabis abuse, uncomplicated: Secondary | ICD-10-CM | POA: Diagnosis not present

## 2017-11-03 LAB — PHOSPHORUS
Phosphorus: 2.3 mg/dL — ABNORMAL LOW (ref 2.5–4.6)
Phosphorus: 1.3 mg/dL — ABNORMAL LOW (ref 2.5–4.6)

## 2017-11-03 LAB — I-STAT CHEM 8, ED
BUN: 31 mg/dL — ABNORMAL HIGH (ref 6–20)
Calcium, Ion: 1.16 mmol/L (ref 1.15–1.40)
Chloride: 91 mmol/L — ABNORMAL LOW (ref 101–111)
Creatinine, Ser: 1 mg/dL (ref 0.61–1.24)
Glucose, Bld: >700 mg/dL (ref 65–99)
HEMATOCRIT: 38 % — AB (ref 39.0–52.0)
HEMOGLOBIN: 12.9 g/dL — AB (ref 13.0–17.0)
Potassium: 5.6 mmol/L — ABNORMAL HIGH (ref 3.5–5.1)
Sodium: 122 mmol/L — ABNORMAL LOW (ref 135–145)
TCO2: 6 mmol/L — AB (ref 22–32)

## 2017-11-03 LAB — GLUCOSE, CAPILLARY
Glucose-Capillary: 124 mg/dL — ABNORMAL HIGH (ref 65–99)
Glucose-Capillary: 128 mg/dL — ABNORMAL HIGH (ref 65–99)
Glucose-Capillary: 135 mg/dL — ABNORMAL HIGH (ref 65–99)
Glucose-Capillary: 148 mg/dL — ABNORMAL HIGH (ref 65–99)
Glucose-Capillary: 154 mg/dL — ABNORMAL HIGH (ref 65–99)
Glucose-Capillary: 157 mg/dL — ABNORMAL HIGH (ref 65–99)
Glucose-Capillary: 160 mg/dL — ABNORMAL HIGH (ref 65–99)
Glucose-Capillary: 161 mg/dL — ABNORMAL HIGH (ref 65–99)
Glucose-Capillary: 163 mg/dL — ABNORMAL HIGH (ref 65–99)
Glucose-Capillary: 177 mg/dL — ABNORMAL HIGH (ref 65–99)
Glucose-Capillary: 185 mg/dL — ABNORMAL HIGH (ref 65–99)
Glucose-Capillary: 191 mg/dL — ABNORMAL HIGH (ref 65–99)
Glucose-Capillary: 194 mg/dL — ABNORMAL HIGH (ref 65–99)

## 2017-11-03 LAB — CBC
HCT: 29.5 % — ABNORMAL LOW (ref 39.0–52.0)
Hemoglobin: 10.1 g/dL — ABNORMAL LOW (ref 13.0–17.0)
MCH: 29.7 pg (ref 26.0–34.0)
MCHC: 34.2 g/dL (ref 30.0–36.0)
MCV: 86.8 fL (ref 78.0–100.0)
Platelets: 445 10*3/uL — ABNORMAL HIGH (ref 150–400)
RBC: 3.4 MIL/uL — ABNORMAL LOW (ref 4.22–5.81)
RDW: 15.1 % (ref 11.5–15.5)
WBC: 26.6 10*3/uL — ABNORMAL HIGH (ref 4.0–10.5)

## 2017-11-03 LAB — CBG MONITORING, ED
GLUCOSE-CAPILLARY: 455 mg/dL — AB (ref 65–99)
GLUCOSE-CAPILLARY: 379 mg/dL — AB (ref 65–99)
GLUCOSE-CAPILLARY: 240 mg/dL — AB (ref 65–99)
Glucose-Capillary: >600 mg/dL (ref 65–99)
Glucose-Capillary: 296 mg/dL — ABNORMAL HIGH (ref 65–99)

## 2017-11-03 LAB — BASIC METABOLIC PANEL
ANION GAP: 12 (ref 5–15)
BUN: 29 mg/dL — ABNORMAL HIGH (ref 6–20)
BUN: 24 mg/dL — ABNORMAL HIGH (ref 6–20)
BUN: 16 mg/dL (ref 6–20)
CALCIUM: 8.8 mg/dL — AB (ref 8.9–10.3)
CALCIUM: 8 mg/dL — AB (ref 8.9–10.3)
CO2: <7 mmol/L — ABNORMAL LOW (ref 22–32)
CO2: <7 mmol/L — ABNORMAL LOW (ref 22–32)
CO2: 16 mmol/L — ABNORMAL LOW (ref 22–32)
Calcium: 8.1 mg/dL — ABNORMAL LOW (ref 8.9–10.3)
Chloride: 84 mmol/L — ABNORMAL LOW (ref 101–111)
Chloride: 98 mmol/L — ABNORMAL LOW (ref 101–111)
Chloride: 109 mmol/L (ref 101–111)
Creatinine, Ser: 2.47 mg/dL — ABNORMAL HIGH (ref 0.61–1.24)
Creatinine, Ser: 2.04 mg/dL — ABNORMAL HIGH (ref 0.61–1.24)
Creatinine, Ser: 1.07 mg/dL (ref 0.61–1.24)
GFR calc Af Amer: 48 mL/min — ABNORMAL LOW (ref >60)
GFR calc non Af Amer: 41 mL/min — ABNORMAL LOW (ref >60)
GFR, EST AFRICAN AMERICAN: 38 mL/min — AB (ref >60)
GFR, EST NON AFRICAN AMERICAN: 33 mL/min — AB (ref >60)
GLUCOSE: 991 mg/dL — AB (ref 65–99)
GLUCOSE: 145 mg/dL — AB (ref 65–99)
Glucose, Bld: 471 mg/dL — ABNORMAL HIGH (ref 65–99)
Potassium: 5.7 mmol/L — ABNORMAL HIGH (ref 3.5–5.1)
Potassium: 4.1 mmol/L (ref 3.5–5.1)
Potassium: 3.7 mmol/L (ref 3.5–5.1)
Sodium: 125 mmol/L — ABNORMAL LOW (ref 135–145)
Sodium: 131 mmol/L — ABNORMAL LOW (ref 135–145)
Sodium: 137 mmol/L (ref 135–145)

## 2017-11-03 LAB — LACTIC ACID, PLASMA: Lactic Acid, Venous: 2 mmol/L (ref 0.5–1.9)

## 2017-11-03 LAB — PROCALCITONIN: Procalcitonin: 0.62 ng/mL

## 2017-11-03 LAB — BETA-HYDROXYBUTYRIC ACID: Beta-Hydroxybutyric Acid: >8 mmol/L — ABNORMAL HIGH (ref 0.05–0.27)

## 2017-11-03 LAB — I-STAT CG4 LACTIC ACID, ED
LACTIC ACID, VENOUS: 3.83 mmol/L — AB (ref 0.5–1.9)
LACTIC ACID, VENOUS: 2.02 mmol/L — AB (ref 0.5–1.9)

## 2017-11-03 LAB — MAGNESIUM
Magnesium: 2.6 mg/dL — ABNORMAL HIGH (ref 1.7–2.4)
Magnesium: 2.3 mg/dL (ref 1.7–2.4)

## 2017-11-03 MED ORDER — POTASSIUM CHLORIDE 10 MEQ/100ML IV SOLN: 10.0000 meq | INTRAVENOUS | Status: DC

## 2017-11-03 MED ORDER — LACTATED RINGERS IV BOLUS (SEPSIS): 1000.0000 mL | Freq: Once | INTRAVENOUS | Status: DC

## 2017-11-03 MED ORDER — SODIUM CHLORIDE 0.9 % IV BOLUS (SEPSIS)
1000.0000 mL | Freq: Once | INTRAVENOUS | Status: AC
  Administered 2017-11-03: 1000 mL via INTRAVENOUS

## 2017-11-03 MED ORDER — TRAZODONE HCL 50 MG PO TABS
150.0000 mg | ORAL_TABLET | Freq: Every day | ORAL | Status: DC
  Administered 2017-11-04 – 2017-11-05 (×2): 150 mg via ORAL
  Filled 2017-11-03 (×3): qty 1

## 2017-11-03 MED ORDER — LACTATED RINGERS IV SOLN: INTRAVENOUS | Status: DC

## 2017-11-03 MED ORDER — DEXTROSE-NACL 5-0.45 % IV SOLN
INTRAVENOUS | Status: DC
  Administered 2017-11-03 – 2017-11-04 (×2): via INTRAVENOUS

## 2017-11-03 MED ORDER — SODIUM CHLORIDE 0.9 % IV SOLN
INTRAVENOUS | Status: DC
  Administered 2017-11-03: 05:00:00 via INTRAVENOUS

## 2017-11-03 MED ORDER — SODIUM CHLORIDE 0.9 % IV SOLN
INTRAVENOUS | Status: DC
  Administered 2017-11-03: 5.4 [IU]/h via INTRAVENOUS
  Filled 2017-11-03 (×3): qty 1

## 2017-11-03 MED ORDER — SODIUM CHLORIDE 0.9 % IV SOLN: INTRAVENOUS | Status: DC

## 2017-11-03 MED ORDER — ENOXAPARIN SODIUM 40 MG/0.4ML ~~LOC~~ SOLN
40.0000 mg | SUBCUTANEOUS | Status: DC
  Administered 2017-11-03 – 2017-11-06 (×4): 40 mg via SUBCUTANEOUS
  Filled 2017-11-03 (×5): qty 0.4

## 2017-11-03 MED ORDER — FENTANYL CITRATE (PF) 100 MCG/2ML IJ SOLN
25.0000 ug | INTRAMUSCULAR | Status: DC | PRN
  Administered 2017-11-03 – 2017-11-04 (×8): 25 ug via INTRAVENOUS
  Filled 2017-11-03 (×8): qty 2

## 2017-11-03 NOTE — ED Notes (Signed)
Delay in lab draw pt using urinal and request that I wait.

## 2017-11-03 NOTE — Progress Notes (Signed)
eLink Physician-Brief Progress Note Patient Name: [name] DOB: [birth year] MRN: [id number]   Date of Service  11/03/2017  HPI/Events of Note  Patient admitted with DKA. Nurse requests serial labs.   eICU Interventions  Will order: 1. BMP, Mg++ and PO4--- now and Q 6 hours X 5.      Intervention Category Major Interventions: Hyperglycemia - active titration of insulin therapy  [name] 11/03/2017, 8:23 PM

## 2017-11-03 NOTE — ED Triage Notes (Signed)
Patient here from home, patient is here for inability to see, he is a diabetic, having kussmaul respirations.  Patient is refusing treatment by [facility] and refusing any treatment here in ED department until he gets a glass of water.  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  He refusing anything at this time.

## 2017-11-03 NOTE — ED Notes (Signed)
Bair hugger applied.

## 2017-11-03 NOTE — ED Notes (Signed)
Checked CBG meter reads HIGH, RN Sherrilyn Rist informed

## 2017-11-03 NOTE — ED Notes (Signed)
This RN made 2 attempted to get blood and another Iv access,unsuccessful

## 2017-11-03 NOTE — ED Notes (Signed)
Nurse will draw VBG

## 2017-11-03 NOTE — H&P (Signed)
PULMONARY / CRITICAL CARE MEDICINE   Name: [name] MRN: [id number] DOB: [birth year]    ADMISSION DATE:  11/03/2017   CHIEF COMPLAINT:  Shortness of breath  HISTORY OF PRESENT ILLNESS:   31 year old male with past medical history of schizophrenia, type 1 DM (strong history of non compliance with multiple recent admissions) presents for 3 days of intermittent shortness of breath. Patient stopped taking his levemir 3-4 days ago because he was not able to afford his medications. He lives with roommates who called EMS. Patient denies any chest pain, fever, cough, headache, dizziness, nausea, vomiting, abdominal pain, dysuria. +diarhea which started when he arrived to the ED. Patient was admitted on 11/4 for DKA and VDRF.   Given 2L NS in the ED and started on NS @125  cc /hour along with insulin ggt. Patient actively having diarrhea right now. +shoulder pain which is chronic.   PAST MEDICAL HISTORY :  He  has a past medical history of Anemia, Anxiety, Arthritis, Chicken pox, Childhood asthma, Chronic pain, Depression, DKA (diabetic ketoacidoses) ([name]) (11/18/2014), DKA (diabetic ketoacidoses) ([name]) (07/05/2017), GERD (gastroesophageal reflux disease), Hypertension, Migraine, Noncompliance with medication regimen, Pneumonia, Polysubstance abuse ([name]), Schizo affective schizophrenia ([name]), Scoliosis, and Type I diabetes mellitus ([name]).  PAST SURGICAL HISTORY: He  has a past surgical history that includes Incision and drainage abscess (Left, 11/2011) and Cardiac catheterization (11/2016).  Allergies  Allergen Reactions  . Sulfa Antibiotics Other (See Comments)    Unknown childhood allergy  . Fentanyl Other (See Comments)    Patient had hallucinations with the medication    No current facility-administered medications on file prior to encounter.    Current Outpatient Medications on File Prior to Encounter  Medication Sig  . acetaminophen (TYLENOL) 500 MG tablet Take 1 tablet (500 mg  total) by mouth every 6 (six) hours as needed.  12/2016 amphetamine-dextroamphetamine (ADDERALL) 30 MG tablet Take 30 mg daily by mouth.  . diclofenac (CATAFLAM) 50 MG tablet Take 1 tablet (50 mg total) by mouth 2 (two) times daily as needed. For right shoulder pain  . doxepin (SINEQUAN) 10 MG/ML solution Take 10 mLs by mouth at bedtime.  . insulin aspart (NOVOLOG FLEXPEN) 100 UNIT/ML FlexPen Inject 0-15 Units into the skin See admin instructions. Check Blood Sugar 4 times per day > with meals and at bedtime CBG 70 - 120: 0 units CBG 121 - 150: 2 units CBG 151 - 200: 3 units CBG 201 - 250: 5 units CBG 251 - 300: 8 units CBG 301 - 350: 11 units CBG 351 - 400: 15 units  . insulin detemir (LEVEMIR) 100 UNIT/ML injection Inject 0.3 mLs (30 Units total) into the skin daily.  . metoCLOPramide (REGLAN) 10 MG tablet Take 1 tablet (10 mg total) by mouth 3 (three) times daily as needed for nausea or vomiting.  . neomycin-bacitracin-polymyxin (NEOSPORIN) OINT Apply 1 application topically daily. Apply Neosporin to left heel daily, then cover with gauze and kerlex  . pantoprazole (PROTONIX) 40 MG tablet Take 1 tablet (40 mg total) by mouth 2 (two) times daily before a meal.  . sennosides-docusate sodium (SENOKOT-S) 8.6-50 MG tablet Take 1 tablet by mouth as needed for constipation.  . traZODone (DESYREL) 150 MG tablet Take 150 mg by mouth at bedtime.  . [DISCONTINUED] citalopram (CELEXA) 20 MG tablet Take 20 mg by mouth daily.  . [DISCONTINUED] FLUoxetine (PROZAC) 20 MG tablet Take 20 mg by mouth every morning.  . [DISCONTINUED] insulin NPH-regular Human (NOVOLIN 70/30) (70-30) 100  UNIT/ML injection Inject 40 Units into the skin 2 (two) times daily with a meal.  . [DISCONTINUED] OLANZapine zydis (ZYPREXA) 10 MG disintegrating tablet Take 10 mg by mouth 2 (two) times daily.  . [DISCONTINUED] sertraline (ZOLOFT) 50 MG tablet Take 50 mg by mouth daily. For depression. Just started med, have not picked up yet [facility] [address]    FAMILY HISTORY:  His indicated that his mother is alive.   SOCIAL HISTORY: He  reports that he has been smoking cigarettes.  He has a 4.50 pack-year smoking history. he has never used smokeless tobacco. He reports that he uses drugs. Drugs: Marijuana, Cocaine, Methamphetamines, and "Crack" cocaine. He reports that he does not drink alcohol.  REVIEW OF SYSTEMS:   As per HPI   VITAL SIGNS: BP 103/64   Pulse (!) 110   Temp (!) 92.9 F (33.8 C) (Rectal)   Resp 18   SpO2 100%   INTAKE / OUTPUT: No intake/output data recorded.  PHYSICAL EXAMINATION: General:  Male appropriate for age lying in bed pan actively having diarrhea. appropriate for age. Mild distress Neuro:  AAOx3 PEERLA EOMI,  Cardiovascular:  s1 s2 tachycardic  Lungs:  Fair air entry b/l no W/R/R Abdomen:  Soft non tender +BS in all 4 quadrants Musculoskeletal:  +pulses in all 4 ext 5/5 muscle strength in all 4 ext Skin:  Dry and intact  LABS:  BMET Recent Labs  Lab 11/03/17 0211 11/03/17 0214  NA 125* 122*  K 5.7* 5.6*  CL 84* 91*  CO2 <7*  --   BUN 29* 31*  CREATININE 2.47* 1.00  GLUCOSE 991* >700*    Electrolytes Recent Labs  Lab 11/03/17 0211  CALCIUM 8.8*    CBC Recent Labs  Lab 11/03/17 0214  HGB 12.9*  HCT 38.0*    Coag's No results for input(s): APTT, INR in the last 168 hours.  Sepsis Markers Recent Labs  Lab 11/03/17 0217  LATICACIDVEN 3.83*    ABG No results for input(s): PHART, PCO2ART, PO2ART in the last 168 hours.  Liver Enzymes No results for input(s): AST, ALT, ALKPHOS, BILITOT, ALBUMIN in the last 168 hours.  Cardiac Enzymes No results for input(s): TROPONINI, PROBNP in the last 168 hours.  Glucose Recent Labs  Lab 11/03/17 0209 11/03/17 0334 11/03/17 0453  GLUCAP >600* >600* >600*    Imaging No results found.    CULTURES: None  ANTIBIOTICS: None   LINES/TUBES: PIV  DISCUSSION: 31 year old with 1DM and medical  non-compliance presents to the ED for shortness of breath. Patient states he has not taken his levemir for that past few days  ASSESSMENT / PLAN:  PULMONARY A: Tachypnea secondary to metabolic acidosis  P:   Breathing comfortably  Maintains saturations on RA F/U CXR   CARDIOVASCULAR A: Tachycardia secondary to hypovolemia and metabolic acidosis  P:  Monitor  Continue with aggressive IVF  RENAL A:  AG Metabolic acidosis secondary to DKA and lactic acidosis Hyperkalemia  Hyponatremia secondary to pseudohyponatremia secondary to hyperglycemia Lactic acidosis secondary to hypovolemia secondary to DKA P:   Given 2 L NS bolus in the ED. Will give another 2 L LR bolus STAT followed by LR@125cc /hour Monitor I/O Q4 hour BMP, Mg, phos Corrected Na of 143. Monitor with q 4 hour BMP Lactic acid improving with IVF Replace K when <4.0   GASTROINTESTINAL A:  Diarrhea  GERD P:   Most likely non infectious. Will monitor  Protonix  HEMATOLOGIC A:  Hx  of Normocytic anemia P:  F/u CBC   INFECTIOUS A:   P:   No clinical sign of infection at this time. F/U CXR and UA Pan culture   ENDOCRINE A:  Type 1 DM H/o non compliance  P:   Continue insulin ggt as per DKA protocol with q1 hour BS checks.  q 4 hour bmp ISS and levemir when DKA protocol is off   NEUROLOGIC A:  Schizophrenia  P:   Restart home meds trazodone   DVT prophylaxis: Lovenox     Lionel December D.O  Pulmonary and Critical Care Medicine [facility] Pager: [phone]  11/03/2017, 6:48 AM

## 2017-11-03 NOTE — ED Provider Notes (Addendum)
TIME SEEN: 2:02 AM  CHIEF COMPLAINT: Labored breathing  HPI: Patient is a 31 year old male with history of insulin-dependent diabetes with frequent admissions for DKA secondary to medical noncompliance, schizophrenia who presents to the emergency department stating that he feels like he has had tachypnea for the past 3 days.  He states he thinks his blood sugar is elevated.  He reports he ran out of his Levemir because it is too expensive and has not been taking his insulin as prescribed.  He denies fevers, cough, vomiting or diarrhea.  No abdominal pain.  ROS: See HPI Constitutional: no fever  Eyes: no drainage  ENT: no runny nose   Cardiovascular:  no chest pain  Resp: no SOB  GI: no vomiting GU: no dysuria Integumentary: no rash  Allergy: no hives  Musculoskeletal: no leg swelling  Neurological: no slurred speech ROS otherwise negative  PAST MEDICAL HISTORY/PAST SURGICAL HISTORY:  Past Medical History:  Diagnosis Date  . Anemia   . Anxiety   . Arthritis    "both hips; both shoulders" (06/06/2017)  . Chicken pox   . Childhood asthma   . Chronic pain   . Depression   . DKA (diabetic ketoacidoses) ([name]) 11/18/2014  . DKA (diabetic ketoacidoses) ([name]) 07/05/2017  . GERD (gastroesophageal reflux disease)   . Hypertension   . Migraine    "a few/year" (06/06/2017)  . Noncompliance with medication regimen   . Pneumonia    "several times" (06/06/2017)  . Polysubstance abuse ([name])   . Schizo affective schizophrenia ([name])   . Scoliosis   . Type I diabetes mellitus ([name])     MEDICATIONS:  Prior to Admission medications   Medication Sig Start Date End Date Taking? Authorizing Provider  acetaminophen (TYLENOL) 500 MG tablet Take 1 tablet (500 mg total) by mouth every 6 (six) hours as needed. 09/10/17   Maxie Barb, MD  amphetamine-dextroamphetamine (ADDERALL) 30 MG tablet Take 30 mg daily by mouth. 10/02/17   [provider]  diclofenac (CATAFLAM) 50 MG tablet  Take 1 tablet (50 mg total) by mouth 2 (two) times daily as needed. For right shoulder pain 10/13/17   Danford, Earl Lites, MD  doxepin (SINEQUAN) 10 MG/ML solution Take 10 mLs by mouth at bedtime. 07/11/17   [provider]  insulin aspart (NOVOLOG FLEXPEN) 100 UNIT/ML FlexPen Inject 0-15 Units into the skin See admin instructions. Check Blood Sugar 4 times per day > with meals and at bedtime CBG 70 - 120: 0 units CBG 121 - 150: 2 units CBG 151 - 200: 3 units CBG 201 - 250: 5 units CBG 251 - 300: 8 units CBG 301 - 350: 11 units CBG 351 - 400: 15 units 09/15/17   Rai, Ripudeep K, MD  insulin detemir (LEVEMIR) 100 UNIT/ML injection Inject 0.3 mLs (30 Units total) into the skin daily. 09/15/17   Rai, Delene Ruffini, MD  metoCLOPramide (REGLAN) 10 MG tablet Take 1 tablet (10 mg total) by mouth 3 (three) times daily as needed for nausea or vomiting. 09/15/17   Rai, Delene Ruffini, MD  neomycin-bacitracin-polymyxin (NEOSPORIN) OINT Apply 1 application topically daily. Apply Neosporin to left heel daily, then cover with gauze and kerlex 09/16/17   Rai, Ripudeep K, MD  pantoprazole (PROTONIX) 40 MG tablet Take 1 tablet (40 mg total) by mouth 2 (two) times daily before a meal. 10/13/17   Danford, Earl Lites, MD  sennosides-docusate sodium (SENOKOT-S) 8.6-50 MG tablet Take 1 tablet by mouth as needed for constipation.  [provider]  traZODone (DESYREL) 150 MG tablet Take 150 mg by mouth at bedtime. 06/26/17   [provider]    ALLERGIES:  Allergies  Allergen Reactions  . Sulfa Antibiotics Other (See Comments)    Unknown childhood allergy  . Fentanyl Other (See Comments)    Patient had hallucinations with the medication    SOCIAL HISTORY:  Social History   Tobacco Use  . Smoking status: Current Every Day Smoker    Packs/day: 0.50    Years: 9.00    Pack years: 4.50    Types: Cigarettes  . Smokeless tobacco: Never Used  Substance Use Topics  . Alcohol use: No     FAMILY HISTORY: Family History  Problem Relation Age of Onset  . Diabetes Mother     EXAM: BP 123/75   Pulse (!) 108   Temp (!) 92.9 F (33.8 C) (Rectal)   Resp 16   SpO2 100%  CONSTITUTIONAL: Alert and oriented but does appear confused and drowsy but is arousable to voice, chronically ill-appearing HEAD: Normocephalic EYES: Conjunctivae clear, pupils appear equal, EOMI ENT: normal nose; moist mucous membranes NECK: Supple, no meningismus, no nuchal rigidity, no LAD  CARD: RRR; S1 and S2 appreciated; no murmurs, no clicks, no rubs, no gallops RESP: Normal chest excursion without splinting; the patient is tachypneic; breath sounds clear and equal bilaterally; no wheezes, no rhonchi, no rales, no hypoxia or respiratory distress, speaking full sentences ABD/GI: Normal bowel sounds; non-distended; soft, non-tender, no rebound, no guarding, no peritoneal signs, no hepatosplenomegaly BACK:  The back appears normal and is non-tender to palpation, there is no CVA tenderness EXT: Normal ROM in all joints; non-tender to palpation; no edema; normal capillary refill; no cyanosis, no calf tenderness or swelling    SKIN: Normal color for age and race; warm; no rash NEURO: Moves all extremities equally PSYCH: The patient's mood and manner are appropriate. Grooming and personal hygiene are appropriate.  MEDICAL DECISION MAKING: Patient here with DKA.  Blood sugar over 600.  He is tachypneic which I think is him compensating for metabolic acidosis.  No hypoxia.  Lungs are clear.  Here frequently for the same.  Will start IV fluids, IV insulin.  He will need admission.  ED PROGRESS: Patient refusing workup.  I have placed him under involuntary commitment because I do not feel he has capacity at this time given he seems to have altered mental status from his DKA.  I do not feel he understands risk and benefits of leaving AGAINST MEDICAL ADVICE and I feel like if he leaves he will die. He is asking me  to IVC him. Rectal temperature here is 92.9.  Will place him under a Lawyer.   Labs show DKA.  Will discuss with medicine for admission.  PH on his VBG was 6.9.  Unable to result in the computer given multiple errors because of patient's DKA.  Will obtain ABG.   4:23 AM  D/w Dr. Toniann Fail.  He would like for me to consult critical care first.  4:36 AM  D/w Dr. Arsenio Loader with critical care.  He states he does not feel it is unreasonable to admit the patient to stepdown unit if he is mentating well.  Will discuss again with hospitalist.  Patient is still mentating normally now but still under IVC.  Tachypnea is improving.  Resting comfortably.  4:46 AM Discussed patient's case with hospitalist, Dr. Toniann Fail.  I have recommended admission and patient (and family if present)  agree with this plan. Admitting physician will place admission orders.   I reviewed all nursing notes, vitals, pertinent previous records, EKGs, lab and urine results, imaging (as available).     EKG Interpretation  Date/Time:  Saturday November 03 2017 02:14:52 EST Ventricular Rate:  102 PR Interval:    QRS Duration: 101 QT Interval:  366 QTC Calculation: 477 R Axis:   -31 Text Interpretation:  Sinus tachycardia Incomplete RBBB and LAFB Probable anteroseptal infarct, old ST elevation, consider inferior injury Confirmed by Jaesean Litzau, Baxter Hire [phone]) on 11/03/2017 2:17:13 AM         CRITICAL CARE Performed by: Baxter Hire Daimon Kean   Total critical care time: 65 minutes  Critical care time was exclusive of separately billable procedures and treating other patients.  Critical care was necessary to treat or prevent imminent or life-threatening deterioration.  Critical care was time spent personally by me on the following activities: development of treatment plan with patient and/or surrogate as well as nursing, discussions with consultants, evaluation of patient's response to treatment, examination of patient,  obtaining history from patient or surrogate, ordering and performing treatments and interventions, ordering and review of laboratory studies, ordering and review of radiographic studies, pulse oximetry and re-evaluation of patient's condition.    Rashida Ladouceur, Layla Maw, DO 11/03/17 0446    Sharmel Ballantine, Layla Maw, DO 11/03/17 0447    Lacora Folmer, Layla Maw, DO 11/03/17 [phone]

## 2017-11-03 NOTE — ED Notes (Signed)
Need to recollect blood for vbg.

## 2017-11-04 DIAGNOSIS — E1010 Type 1 diabetes mellitus with ketoacidosis without coma: Principal | ICD-10-CM

## 2017-11-04 LAB — BASIC METABOLIC PANEL
Anion gap: 8 (ref 5–15)
BUN: 13 mg/dL (ref 6–20)
CALCIUM: 8.2 mg/dL — AB (ref 8.9–10.3)
CHLORIDE: 108 mmol/L (ref 101–111)
CO2: 20 mmol/L — AB (ref 22–32)
Creatinine, Ser: 1.02 mg/dL (ref 0.61–1.24)
GFR calc Af Amer: >60 mL/min (ref >60)
GFR calc non Af Amer: >60 mL/min (ref >60)
Glucose, Bld: 176 mg/dL — ABNORMAL HIGH (ref 65–99)
Potassium: 3.3 mmol/L — ABNORMAL LOW (ref 3.5–5.1)
Sodium: 136 mmol/L (ref 135–145)

## 2017-11-04 LAB — GLUCOSE, CAPILLARY
Glucose-Capillary: 113 mg/dL — ABNORMAL HIGH (ref 65–99)
Glucose-Capillary: 115 mg/dL — ABNORMAL HIGH (ref 65–99)
Glucose-Capillary: 139 mg/dL — ABNORMAL HIGH (ref 65–99)
Glucose-Capillary: 159 mg/dL — ABNORMAL HIGH (ref 65–99)
Glucose-Capillary: 163 mg/dL — ABNORMAL HIGH (ref 65–99)
Glucose-Capillary: 174 mg/dL — ABNORMAL HIGH (ref 65–99)
Glucose-Capillary: 195 mg/dL — ABNORMAL HIGH (ref 65–99)
Glucose-Capillary: 215 mg/dL — ABNORMAL HIGH (ref 65–99)
Glucose-Capillary: 218 mg/dL — ABNORMAL HIGH (ref 65–99)
Glucose-Capillary: 221 mg/dL — ABNORMAL HIGH (ref 65–99)
Glucose-Capillary: 62 mg/dL — ABNORMAL LOW (ref 65–99)

## 2017-11-04 LAB — PROCALCITONIN: Procalcitonin: 0.47 ng/mL

## 2017-11-04 LAB — PHOSPHORUS: Phosphorus: 1.2 mg/dL — ABNORMAL LOW (ref 2.5–4.6)

## 2017-11-04 LAB — MAGNESIUM: MAGNESIUM: 2.3 mg/dL (ref 1.7–2.4)

## 2017-11-04 MED ORDER — INSULIN ASPART 100 UNIT/ML ~~LOC~~ SOLN
4.0000 [IU] | Freq: Three times a day (TID) | SUBCUTANEOUS | Status: DC
  Administered 2017-11-04 – 2017-11-05 (×2): 4 [IU] via SUBCUTANEOUS

## 2017-11-04 MED ORDER — WHITE PETROLATUM EX OINT
TOPICAL_OINTMENT | CUTANEOUS | Status: AC
  Administered 2017-11-04: 05:00:00
  Filled 2017-11-04: qty 28.35

## 2017-11-04 MED ORDER — ACETAMINOPHEN 325 MG PO TABS
650.0000 mg | ORAL_TABLET | Freq: Four times a day (QID) | ORAL | Status: DC | PRN
  Administered 2017-11-04 – 2017-11-05 (×2): 650 mg via ORAL
  Filled 2017-11-04 (×2): qty 2

## 2017-11-04 MED ORDER — PANTOPRAZOLE SODIUM 40 MG PO TBEC
40.0000 mg | DELAYED_RELEASE_TABLET | Freq: Two times a day (BID) | ORAL | Status: DC
  Administered 2017-11-04 – 2017-11-06 (×5): 40 mg via ORAL
  Filled 2017-11-04 (×5): qty 1

## 2017-11-04 MED ORDER — METOCLOPRAMIDE HCL 10 MG PO TABS
10.0000 mg | ORAL_TABLET | Freq: Three times a day (TID) | ORAL | Status: DC | PRN
  Administered 2017-11-06: 10 mg via ORAL
  Filled 2017-11-04: qty 1

## 2017-11-04 MED ORDER — PHENOL 1.4 % MT LIQD
1.0000 | OROMUCOSAL | Status: DC | PRN
  Administered 2017-11-04: 1 via OROMUCOSAL
  Filled 2017-11-04: qty 177

## 2017-11-04 MED ORDER — POTASSIUM PHOSPHATES 15 MMOLE/5ML IV SOLN
30.0000 mmol | Freq: Once | INTRAVENOUS | Status: AC
  Administered 2017-11-04: 30 mmol via INTRAVENOUS
  Filled 2017-11-04: qty 10

## 2017-11-04 MED ORDER — MAGIC MOUTHWASH
5.0000 mL | Freq: Three times a day (TID) | ORAL | Status: DC | PRN
  Administered 2017-11-04 – 2017-11-06 (×6): 5 mL via ORAL
  Filled 2017-11-04 (×6): qty 5

## 2017-11-04 MED ORDER — INSULIN ASPART 100 UNIT/ML ~~LOC~~ SOLN: 0.0000 [IU] | Freq: Every day | SUBCUTANEOUS | Status: DC

## 2017-11-04 MED ORDER — INSULIN DETEMIR 100 UNIT/ML ~~LOC~~ SOLN
30.0000 [IU] | Freq: Every day | SUBCUTANEOUS | Status: DC
  Administered 2017-11-04 – 2017-11-05 (×2): 30 [IU] via SUBCUTANEOUS
  Filled 2017-11-04 (×2): qty 0.3

## 2017-11-04 MED ORDER — AMPHETAMINE-DEXTROAMPHETAMINE 10 MG PO TABS
30.0000 mg | ORAL_TABLET | Freq: Every day | ORAL | Status: DC
  Administered 2017-11-04 – 2017-11-06 (×3): 30 mg via ORAL
  Filled 2017-11-04 (×3): qty 3

## 2017-11-04 MED ORDER — DOXEPIN HCL 10 MG/ML PO CONC
100.0000 mg | Freq: Every day | ORAL | Status: DC
  Administered 2017-11-04 – 2017-11-05 (×2): 100 mg via ORAL
  Filled 2017-11-04 (×3): qty 10

## 2017-11-04 MED ORDER — INSULIN ASPART 100 UNIT/ML ~~LOC~~ SOLN
0.0000 [IU] | Freq: Three times a day (TID) | SUBCUTANEOUS | Status: DC
  Administered 2017-11-04 – 2017-11-05 (×2): 3 [IU] via SUBCUTANEOUS
  Administered 2017-11-05 – 2017-11-06 (×3): 5 [IU] via SUBCUTANEOUS

## 2017-11-04 MED ORDER — FENTANYL CITRATE (PF) 100 MCG/2ML IJ SOLN: 25.0000 ug | INTRAMUSCULAR | Status: DC | PRN

## 2017-11-04 NOTE — Progress Notes (Signed)
NURSING PROGRESS NOTE  ZACCAI CHAVARIN [id number] Transfer Data: 11/04/2017 3:59 PM Attending Provider: Coralyn Helling, MD QTM:[id number], Dorma Russell, MD Code Status: FULL   [name] is a 31 y.o. male patient transferred from 60M  -No acute distress noted.  -No complaints of shortness of breath.  -No complaints of chest pain.   Cardiac Monitoring: Box # 29 in place. Cardiac monitor yields:normal sinus rhythm.  Last Documented Vital Signs: Blood pressure (!) 133/95, pulse (!) 108, temperature 98.8 F (37.1 C), temperature source Oral, resp. rate 20, height 6\' 1"  (1.854 m), weight 64.6 kg (142 lb 6.7 oz), SpO2 100 %.  IV Fluids:  IV in place, occlusive dsg intact without redness, IV cath hand right, condition patent and no redness IV push only, no IV fluids.   Allergies:  Sulfa antibiotics and Fentanyl  Past Medical History:   has a past medical history of Anemia, Anxiety, Arthritis, Chicken pox, Childhood asthma, Chronic pain, Depression, DKA (diabetic ketoacidoses) ([name]) (11/18/2014), DKA (diabetic ketoacidoses) ([name]) (07/05/2017), GERD (gastroesophageal reflux disease), Hypertension, Migraine, Noncompliance with medication regimen, Pneumonia, Polysubstance abuse ([name]), Schizo affective schizophrenia ([name]), Scoliosis, and Type I diabetes mellitus ([name]).  Past Surgical History:   has a past surgical history that includes Incision and drainage abscess (Left, 11/2011) and Cardiac catheterization (11/2016).  Social History:   reports that he has been smoking cigarettes.  He has a 4.50 pack-year smoking history. he has never used smokeless tobacco. He reports that he uses drugs. Drugs: Marijuana, Cocaine, Methamphetamines, and "Crack" cocaine. He reports that he does not drink alcohol.  Skin: intact except where otherwise charted  Patient/Family orientated to room. Information packet given to patient/family. Admission inpatient armband information verified with patient/family to include  name and date of birth and placed on patient arm. Side rails up x 2, fall assessment and education completed with patient/family. Patient/family able to verbalize understanding of risk associated with falls and verbalized understanding to call for assistance before getting out of bed. Call light within reach. Patient/family able to voice and demonstrate understanding of unit orientation instructions.

## 2017-11-04 NOTE — Progress Notes (Signed)
eLink Physician-Brief Progress Note Patient Name: [name] DOB: 08-18-1986 MRN: [id number]   Date of Service  11/04/2017  HPI/Events of Note  K+ = 3.7, PO4--- = 1.3 and Creatinine = 1.07.  eICU Interventions  Will replace K+ and PO4---.      Intervention Category Major Interventions: Electrolyte abnormality - evaluation and management  Koraline Phillipson Eugene 11/04/2017, 2:14 AM

## 2017-11-04 NOTE — Progress Notes (Signed)
PULMONARY / CRITICAL CARE MEDICINE   Name: [name] MRN: [id number] DOB: [birth year]    ADMISSION DATE:  11/03/2017   CHIEF COMPLAINT:  Shortness of breath  HISTORY OF PRESENT ILLNESS:   31 yo male smoker with recurrent DKA.  PMHx of schizophrenia.  Concern for suicidal ideation.  SUBJECTIVE: Wants to eat, and wants to eat now.  VITAL SIGNS: BP 138/74   Pulse (!) 107   Temp 98.8 F (37.1 C) (Oral)   Resp 15   Ht 6\' 1"  (1.854 m)   Wt 142 lb 6.7 oz (64.6 kg)   SpO2 100%   BMI 18.79 kg/m   INTAKE / OUTPUT: I/O last 3 completed shifts: In: 3827.4 [I.V.:1317.4; IV Piggyback:2510] Out: 1500 [Urine:1500]  PHYSICAL EXAMINATION:  General - agitated Eyes - pupils reactive ENT - no sinus tenderness, no oral exudate, no LAN Cardiac - regular, no murmur Chest - no wheeze, rales Abd - soft, non tender Ext - no edema Skin - no rashes Neuro - normal strength  LABS:  BMET Recent Labs  Lab 11/03/17 0628 11/03/17 2029 11/04/17 0252  NA 131* 137 136  K 4.1 3.7 3.3*  CL 98* 109 108  CO2 <7* 16* 20*  BUN 24* 16 13  CREATININE 2.04* 1.07 1.02  GLUCOSE 471* 145* 176*    Electrolytes Recent Labs  Lab 11/03/17 0628 11/03/17 2029 11/04/17 0252  CALCIUM 8.1* 8.0* 8.2*  MG 2.6* 2.3 2.3  PHOS 2.3* 1.3* 1.2*    CBC Recent Labs  Lab 11/03/17 0214 11/03/17 1214  WBC  --  26.6*  HGB 12.9* 10.1*  HCT 38.0* 29.5*  PLT  --  445*    Coag's No results for input(s): APTT, INR in the last 168 hours.  Sepsis Markers Recent Labs  Lab 11/03/17 0217 11/03/17 0628 11/03/17 0646 11/04/17 0252  LATICACIDVEN 3.83* 2.0* 2.02*  --   PROCALCITON  --  0.62  --  0.47    ABG No results for input(s): PHART, PCO2ART, PO2ART in the last 168 hours.  Liver Enzymes No results for input(s): AST, ALT, ALKPHOS, BILITOT, ALBUMIN in the last 168 hours.  Cardiac Enzymes No results for input(s): TROPONINI, PROBNP in the last 168 hours.  Glucose Recent Labs  Lab  11/04/17 0256 11/04/17 0345 11/04/17 0457 11/04/17 0609 11/04/17 0754 11/04/17 0857  GLUCAP 174* 159* 139* 115* 163* 221*    Imaging No results found.   DISCUSSION: 31 year old with 1DM and medical non-compliance presents to the ED for shortness of breath. Patient states he has not taken his levemir for that past few days  ASSESSMENT / PLAN:  Recurrent DKA due to non compliance. - transition to lantus and sliding scale - advance diet  Hypokalemia, hypophosphatemia. - replace electrolytes as needed  Hx of schizophrenia with concern for suicidal ideation. - sitter at bedside - will need psychiatry evaluation when more stable  Hx of GERD. - protonix  Anemia of critical illness and chronic disease. - f/u CBC  DVT prophylaxis - lovenox SUP - protonix Nutrition - carb modified Goals of care - full code  To floor bed once off insulin gtt and then to [facility].  32, MD [facility] Pulmonary/Critical Care 11/04/2017, 9:31 AM Pager:  [phone] After 3pm call: [phone]

## 2017-11-05 ENCOUNTER — Other Ambulatory Visit: Payer: Self-pay

## 2017-11-05 ENCOUNTER — Encounter (HOSPITAL_COMMUNITY): Payer: Self-pay | Admitting: General Practice

## 2017-11-05 LAB — GLUCOSE, CAPILLARY
Glucose-Capillary: 145 mg/dL — ABNORMAL HIGH (ref 65–99)
Glucose-Capillary: 163 mg/dL — ABNORMAL HIGH (ref 65–99)
Glucose-Capillary: 195 mg/dL — ABNORMAL HIGH (ref 65–99)
Glucose-Capillary: 248 mg/dL — ABNORMAL HIGH (ref 65–99)
Glucose-Capillary: 39 mg/dL — CL (ref 65–99)
Glucose-Capillary: 41 mg/dL — CL (ref 65–99)
Glucose-Capillary: 48 mg/dL — ABNORMAL LOW (ref 65–99)
Glucose-Capillary: 55 mg/dL — ABNORMAL LOW (ref 65–99)
Glucose-Capillary: 68 mg/dL (ref 65–99)
Glucose-Capillary: 80 mg/dL (ref 65–99)
Glucose-Capillary: 83 mg/dL (ref 65–99)

## 2017-11-05 MED ORDER — OXYCODONE-ACETAMINOPHEN 5-325 MG PO TABS
1.0000 | ORAL_TABLET | ORAL | Status: DC | PRN
  Administered 2017-11-05 – 2017-11-06 (×4): 1 via ORAL
  Filled 2017-11-05 (×4): qty 1

## 2017-11-05 MED ORDER — POTASSIUM PHOSPHATE MONOBASIC 500 MG PO TABS
500.0000 mg | ORAL_TABLET | Freq: Two times a day (BID) | ORAL | Status: DC
  Filled 2017-11-05: qty 1

## 2017-11-05 MED ORDER — INSULIN DETEMIR 100 UNIT/ML ~~LOC~~ SOLN
20.0000 [IU] | Freq: Every day | SUBCUTANEOUS | Status: DC
  Administered 2017-11-06: 20 [IU] via SUBCUTANEOUS
  Filled 2017-11-05: qty 0.2

## 2017-11-05 MED ORDER — K PHOS MONO-SOD PHOS DI & MONO 155-852-130 MG PO TABS
500.0000 mg | ORAL_TABLET | Freq: Two times a day (BID) | ORAL | Status: DC
  Administered 2017-11-06 (×2): 500 mg via ORAL
  Filled 2017-11-05 (×4): qty 2

## 2017-11-05 MED ORDER — DEXTROSE 50 % IV SOLN
INTRAVENOUS | Status: AC
  Filled 2017-11-05: qty 50

## 2017-11-05 NOTE — Plan of Care (Signed)
Patient is progressing and feeling better than he has, blood sugars are running lower.  RN will continue to monitor patient and report any issues to MD as needed. P.J. Henderson Newcomer, RN

## 2017-11-05 NOTE — Progress Notes (Signed)
PULMONARY / CRITICAL CARE MEDICINE   Name: [name] MRN: [id number] DOB: [birth year]    ADMISSION DATE:  11/03/2017   CHIEF COMPLAINT:  Shortness of breath  HISTORY OF PRESENT ILLNESS:   31 yo male smoker with recurrent DKA.  PMHx of schizophrenia.  Concern for suicidal ideation.  SUBJECTIVE:  Had hypoglycemic events, down to 39.  Mad that he is still in hospital.  VITAL SIGNS: BP 127/86 (BP Location: Left Arm)   Pulse (!) 120   Temp 98.3 F (36.8 C) (Oral)   Resp 17   Ht 6\' 1"  (1.854 m)   Wt 64.6 kg (142 lb 6.7 oz)   SpO2 100%   BMI 18.79 kg/m   INTAKE / OUTPUT: I/O last 3 completed shifts: In: 2824.2 [P.O.:840; I.V.:1374.2; Other:100; IV Piggyback:510] Out: -   PHYSICAL EXAMINATION:  General - Young male, agitated HENT - [place] / AT, no sinus tenderness, no oral exudate, no LAN Cardiac - regular, no murmur Chest - no wheeze, rales Abd - soft, non tender Ext - no edema Skin - no rashes Neuro - no deficits  LABS:  BMET Recent Labs  Lab 11/03/17 0628 11/03/17 2029 11/04/17 0252  NA 131* 137 136  K 4.1 3.7 3.3*  CL 98* 109 108  CO2 <7* 16* 20*  BUN 24* 16 13  CREATININE 2.04* 1.07 1.02  GLUCOSE 471* 145* 176*    Electrolytes Recent Labs  Lab 11/03/17 0628 11/03/17 2029 11/04/17 0252  CALCIUM 8.1* 8.0* 8.2*  MG 2.6* 2.3 2.3  PHOS 2.3* 1.3* 1.2*    CBC Recent Labs  Lab 11/03/17 0214 11/03/17 1214  WBC  --  26.6*  HGB 12.9* 10.1*  HCT 38.0* 29.5*  PLT  --  445*    Coag's No results for input(s): APTT, INR in the last 168 hours.  Sepsis Markers Recent Labs  Lab 11/03/17 0217 11/03/17 0628 11/03/17 0646 11/04/17 0252  LATICACIDVEN 3.83* 2.0* 2.02*  --   PROCALCITON  --  0.62  --  0.47    ABG No results for input(s): PHART, PCO2ART, PO2ART in the last 168 hours.  Liver Enzymes No results for input(s): AST, ALT, ALKPHOS, BILITOT, ALBUMIN in the last 168 hours.  Cardiac Enzymes No results for input(s): TROPONINI,  PROBNP in the last 168 hours.  Glucose Recent Labs  Lab 11/05/17 0053 11/05/17 0804 11/05/17 1301 11/05/17 1611 11/05/17 1632 11/05/17 1706  GLUCAP 83 248* 163* 39* 41* 68    Imaging No results found.   DISCUSSION: 31 year old with 1DM and medical non-compliance presents to the ED for shortness of breath. Patient states he has not taken his levemir for the past few days  ASSESSMENT / PLAN:  Recurrent DKA due to non compliance. - transition to lantus and sliding scale - advance diet  Hypoglycemia. - decrease levemir from 30u to 20u  Hypokalemia, hypophosphatemia. - replace Kphos now  Hx of schizophrenia with concern for suicidal ideation - of note, pt adamantly denies. - sitter at bedside - psych consult placed 11/19  Hx of GERD. - protonix  Anemia of critical illness and chronic disease. - Transfuse for Hgb < 7  DVT prophylaxis - lovenox SUP - protonix Nutrition - carb modified Goals of care - full code  Transfer to [facility], confirmed with [facility].  PCCM off AM 11/20.   12/20, [facility] [place] [facility] Medicine Pager: [phone]  or [phone] 11/05/2017, 5:33 PM

## 2017-11-05 NOTE — Progress Notes (Signed)
Patient c/o feeling like his blood sugar is low.  CBG checked, blood sugar is 55.  Patient refused oral glucose and requested apple juice and something to eat.  NT gave patient apple juice and a frozen meal per patient's request.  CBG will be checked again to monitor patient.  Patient is A&O x4, speech clear, no other issues noted.  RN will monitor and report any issues to MD as needed. P.J. Henderson Newcomer, RN

## 2017-11-05 NOTE — Progress Notes (Signed)
NT went in patient's room to take VS and found patient and linens to be soaked with sweat.  Patient refused VS.  RN encouraged patient to allow NT to take VS and check CBG, but patient became verbally aggressive and yelled at staff saying he does not want to be woke up and does not want to have his blood sugar checked.  Patient states he would know if his blood sugar is low.  RN attempted to reason with patient and explained the importance of allowing staff to check his blood sugar and VS to make sure he is ok, patient voiced understanding, but continues to refuse.  RN notified Elink, spoke with Lebron Conners and made her aware that patient is refusing care.  Lebron Conners states she will have Dr. Donette Larry call me.  Dr. Donette Larry returned call to RN and stated that he will have someone come and speak with patient.  RN will continue to monitor patient and notify MD of any changes to condition. P.J. Henderson Newcomer, RN

## 2017-11-05 NOTE — Progress Notes (Signed)
CBG rechecked and is now 83 after patient drank apple juice and has eaten part of frozen meal.  Patient states he feels better.  RN will continue to monitor patent and make MD aware of any issues.  Also, patient's IV in right had became dislodged and patient refuses another IV at this time.  RN explained to patient that if there is an emergency, it is safer to have an IV already in place to treat patient more quickly and also reminded patient that his Fentanyl for pain is currently ordered IV.  Patient voiced understanding and states he will not take anymore pain medication and he does not want IV restarted.  RN will continue to monitor patient and make MD aware of any issues. P.J. Henderson Newcomer, RN

## 2017-11-05 NOTE — Progress Notes (Signed)
eLink Physician-Brief Progress Note Patient Name: [name] DOB: Aug 03, 1986 MRN: [id number]   Date of Service  11/05/2017  HPI/Events of Note  Patient refuses to have CBG drawn or VS taken.   eICU Interventions  Will ask ground team to go and speak with the patient at bedside.      Intervention Category Major Interventions: Other:  [name] 11/05/2017, 11:59 PM

## 2017-11-05 NOTE — Progress Notes (Addendum)
Hypoglycemic Event  CBG: 39  Treatment: 15 GM carbohydrate snack  Symptoms: Sweaty and Nervous/irritable Heart reported to be 120's since 1559.  Follow-up CBG: Time:1640  CBG Result:41 Another snack given  Follow up CBG @ 1706 CBG 68  Checked again shortley thereafter CBG 48  Checked again @ 1812 CBG 80  Possible Reasons for Event: Inadequate meal intake  Comments/MD notified:Dr. Wallace Cullens aware will monitor    [name] [name]

## 2017-11-06 DIAGNOSIS — E1310 Other specified diabetes mellitus with ketoacidosis without coma: Secondary | ICD-10-CM

## 2017-11-06 DIAGNOSIS — F17210 Nicotine dependence, cigarettes, uncomplicated: Secondary | ICD-10-CM

## 2017-11-06 DIAGNOSIS — Z9114 Patient's other noncompliance with medication regimen: Secondary | ICD-10-CM

## 2017-11-06 DIAGNOSIS — F1210 Cannabis abuse, uncomplicated: Secondary | ICD-10-CM

## 2017-11-06 DIAGNOSIS — F203 Undifferentiated schizophrenia: Secondary | ICD-10-CM

## 2017-11-06 DIAGNOSIS — R450 Nervousness: Secondary | ICD-10-CM

## 2017-11-06 DIAGNOSIS — F419 Anxiety disorder, unspecified: Secondary | ICD-10-CM

## 2017-11-06 DIAGNOSIS — M2550 Pain in unspecified joint: Secondary | ICD-10-CM

## 2017-11-06 LAB — GLUCOSE, CAPILLARY
Glucose-Capillary: 234 mg/dL — ABNORMAL HIGH (ref 65–99)
Glucose-Capillary: 201 mg/dL — ABNORMAL HIGH (ref 65–99)
Glucose-Capillary: 108 mg/dL — ABNORMAL HIGH (ref 65–99)
Glucose-Capillary: 93 mg/dL (ref 65–99)

## 2017-11-06 MED ORDER — BENZOCAINE 10 % MT GEL
Freq: Three times a day (TID) | OROMUCOSAL | Status: DC | PRN
  Administered 2017-11-06: 1 via OROMUCOSAL
  Filled 2017-11-06: qty 9

## 2017-11-06 NOTE — Progress Notes (Signed)
Called to patient bedside as patient is continuing to refuse care.   Spoke with patient about importance of Monitoring CBG and Vitals. Patient states that he is trying to rest and will allow this to be done only after 5 am. He understands that today glucose levels have been low and the risk he is taking by not having it checked more frequently. Last glucose was stable (145) at 2132.   Jovita Kussmaul, AGACNP-BC [place] [facility]  Pgr: [phone]  PCCM Pgr: [phone]

## 2017-11-06 NOTE — Progress Notes (Signed)
Patient continues to refuse any care including labs, CBG, VS and telemetry.  P.J. Henderson Newcomer, RN

## 2017-11-06 NOTE — Progress Notes (Addendum)
Patient asking for his discharge. He said that the psych. doctor told him that he is not risk for suicide and he would like to leave AMA. MD made aware. RN explained to patient that MD would like to keep him one more night in the hospital to monitor his CBG (last night he had an episode of hypoglycemia and this AM nausea/vomiting). He said he is feeling well and he will leave AMA. At this time there is no order from psych doctor to d/c IVC and psych MD made aware about patient's request. Will continue to monitor.

## 2017-11-06 NOTE — Progress Notes (Addendum)
Patient signed the paper for AMA and left. MD made aware. No IV access on this time.

## 2017-11-06 NOTE — Progress Notes (Signed)
Inpatient Diabetes Program Recommendations  AACE/ADA: New Consensus Statement on Inpatient Glycemic Control (2015)  Target Ranges:  Prepandial:   less than 140 mg/dL      Peak postprandial:   less than 180 mg/dL (1-2 hours)      Critically ill patients:  140 - 180 mg/dL   Lab Results  Component Value Date   GLUCAP 234 (H) 11/06/2017   HGBA1C 7.8 (H) 10/11/2017    Review of Glycemic Control  Results for [name], [name] (MRN [id number]) as of 11/06/2017 10:07  Ref. Range 11/05/2017 17:06 11/05/2017 17:35 11/05/2017 18:12 11/05/2017 21:32 11/06/2017 08:24  Glucose-Capillary Latest Ref Range: 65 - 99 mg/dL 68 48 (L) 80 062 (H) 376 (H)    Diabetes history: Type 1 Outpatient Diabetes medications: ordered Levemir 30 units q day (has not been taking), Novolog flex pen 0-11 units tid Current orders for Inpatient glycemic control: Levemir 20 units qday, Novolog 0-15 units tid, Novolog 0-5 units qhs  Inpatient Diabetes Program Recommendations:  Low blood sugar yesterday likely as a result of Novolog correction insulin.   Please consider ordering Novolog 3 units tid (hold if patient eats less than 50%) and change Novolog correction to 0-9 units tid.   Consider increasing Levemir to 25 units qday (fasting blood sugar 234mg /dl)   , RN, BA, MHA, CDE Diabetes Coordinator Inpatient Diabetes Program  [phone] (Team Pager) [phone] [facility]) 11/06/2017 10:14 AM

## 2017-11-06 NOTE — Consult Note (Addendum)
[facility] Face-to-Face Psychiatry Consult   Reason for Consult:  SI Referring Physician:  Dr. Caleb Popp Patient Identification: [name] Principal Diagnosis: Undifferentiated schizophrenia [facility]) Diagnosis:   Patient Active Problem List   Diagnosis Date Noted  . DKA (diabetic ketoacidosis) ([name]) [E13.10] 11/03/2017  . Pressure injury of skin [L89.90] 10/11/2017  . Encephalopathy acute [G93.40]   . Hyperbilirubinemia [E80.6] 09/14/2017  . Heel ulcer ([name]) [L97.409] 09/14/2017  . Noncompliance with medication regimen [Z91.14]   . Hypokalemia [E87.6]   . Non healing left heel wound [S91.302A]   . Cellulitis [L03.90]   . DKA, type 1 ([name]) [E10.10] 08/17/2017  . Encounter for imaging study to confirm orogastric (OG) tube placement [Z01.89]   . Acute respiratory acidosis [E87.2]   . DKA (diabetic ketoacidoses) ([name]) [E13.10] 08/04/2017  . Acute respiratory failure with hypoxia ([name]) [J96.01]   . Seizure ([name]) [R56.9]   . Diabetic ketoacidosis ([name]) [E13.10] 07/30/2017  . Normocytic anemia [D64.9] 07/05/2017  . Numbness of right hand [R20.0] 07/05/2017  . Elevated troponin I measurement [R74.8]   . Positive D dimer [R79.89]   . DKA, type 1, not at goal [facility]) [E10.10] 07/01/2017  . Hyperkalemia [E87.5]   . AKI (acute kidney injury) ([name]) [N17.9]   . Medically noncompliant [Z91.19]   . Hyperglycemia [R73.9] 06/16/2017  . Nausea & vomiting [R11.2] 06/15/2017  . Abdominal pain [R10.9] 06/15/2017  . Sepsis ([name]) [A41.9] 06/07/2017  . Chest pain [R07.9] 06/07/2017  . GERD (gastroesophageal reflux disease) [K21.9] 06/07/2017  . Substance abuse ([name]) [F19.10] 05/30/2016  . Substance induced mood disorder ([name]) [F19.94] 05/30/2016  . Tobacco use disorder [F17.200] 12/24/2015  . DM hyperosmolarity type I, uncontrolled ([name]) [E10.69, E10.65] 12/19/2015  . Generalized headache [R51] 07/22/2015  . Bilateral hip bursitis [M70.71, M70.72] 05/26/2015  . Depression [F32.9]   .  Generalized anxiety disorder [F41.1] 03/29/2015  . Undifferentiated schizophrenia ([name]) [F20.3]   . Hip pain, bilateral [M25.551, M25.552] 11/18/2014  . Dehydration [E86.0] 06/17/2012    Total Time spent with patient: 1 hour  Subjective:   [name] is a 31 y.o. male patient admitted with DKA in the setting of medication noncompliance and was IVC'd for refusing medical care.   HPI:   Per chart review, patient has a history of schizophrenia. He has been agitated and refusing care. Psychiatry was consulted for concern for SI but patient currently denies. He is prescribed Adderall 30 mg daily by Dr. Fleet Contras. He was prescribed Oxycodone once by Dr. Blake Divine in June.    [name]. He finds it more difficult to manage his diabetes. He ran out of his long acting insulin because he used the money had for court fees. He reports checking his blood glucose in the morning prior to admission and his blood glucose was normal. He reports playing his gaming system for the remainder of the day without eating anything. He had acute onset of nausea and vomiting prior to admission so his friend called EMS. He reports frustration when they arrived because he was cooperative but was still IVC'd. He believes this is because a male EMT was working that he had previously had a poor interaction with. He reports that she is disrespectful and has used derogatory language towards him. He denies SI and denies making any suicidal statements. He denies HI. He denies AVH but he does report intermittently believing that he can read others thoughts. He reports some  anxiety about being in the hospital since he feels trapped due to the IVC that was placed. He denies any [name] with sleep or appetite. He denies [name] with depression. He is currently taking Adderall for depression and energy. He is not currently taking other psychotropic  medications.  Past Psychiatric History: Schizoaffective disorder.   Risk to Self: Is patient at risk for suicide?: No Risk to Others:  None. Denies HI.  Prior Inpatient Therapy:  He has had multiple admissions for psychosis. [facility] 2017.  Prior Outpatient Therapy:  His medications are managed by his PCP. He has taken Trazodone, Seroquel, Abilify maintena and Risperdal consta in the past.   Past Medical History:  Past Medical History:  Diagnosis Date  . Anemia   . Anxiety   . Arthritis    "both hips; both shoulders" (11/05/2017)  . Chicken pox   . Childhood asthma   . Chronic pain   . Depression   . DKA (diabetic ketoacidoses) ([name]) 11/18/2014  . DKA (diabetic ketoacidoses) ([name]) 07/05/2017  . GERD (gastroesophageal reflux disease)   . Hypertension   . Migraine    "a few/year" (06/06/2017)  . Noncompliance with medication regimen   . Pneumonia    "several times" (06/06/2017)  . Polysubstance abuse ([name])   . Schizo affective schizophrenia ([name])   . Scoliosis   . Type I diabetes mellitus ([name])     Past Surgical History:  Procedure Laterality Date  . CARDIAC CATHETERIZATION  11/2016  . INCISION AND DRAINAGE ABSCESS Left 11/2011   "MRSA removed off my thumb"  . INCISION AND DRAINAGE ABSCESS Left 12/19/2011   Performed by Sharma Covertrtmann, Fred W, [facility]  . IRRIGATION AND DEBRIDEMENT EXTREMITY Left 12/19/2011   Performed by Sharma Covertrtmann, Fred W, [facility]   Family History:  Family History  Problem Relation Age of Onset  . Diabetes Mother    Family Psychiatric  History: maternal aunt-dementia  Social History:  Social History   Substance and Sexual Activity  Alcohol Use No     Social History   Substance and Sexual Activity  Drug Use Yes  . Types: Marijuana   Comment: 11/05/2017 "nonesince 2012"; pt denies hx of cocaine and methamphetamines use on 11/05/2017    Social History   Socioeconomic History  . Marital status: Legally Separated     Spouse name: None  . Number of children: 0  . Years of education: 813  . Highest education level: None  Social Needs  . Financial resource strain: None  . Food insecurity - worry: None  . Food insecurity - inability: None  . Transportation needs - medical: None  . Transportation needs - non-medical: None  Occupational History  . Occupation: Disability  Tobacco Use  . Smoking status: Current Every Day Smoker    Packs/day: 0.50    Years: 9.00    Pack years: 4.50    Types: Cigarettes  . Smokeless tobacco: Never Used  Substance and Sexual Activity  . Alcohol use: No  . Drug use: Yes    Types: Marijuana    Comment: 11/05/2017 "nonesince 2012"; pt denies hx of cocaine and methamphetamines use on 11/05/2017  . Sexual activity: Not Currently  Other Topics Concern  . None  Social History Narrative   ** Merged History Encounter **    Fun: Video games   Additional Social History: He is from [place]. He graduated from high school and completed some college. He receives  disability for mental illness. He was in jail for 6 months and is currently on probation. He did not want to elaborate further on this topic.      Allergies:   Allergies  Allergen Reactions  . Sulfa Antibiotics Other (See Comments)    Unknown childhood allergy  . Fentanyl Other (See Comments)    Patient had hallucinations with the medication    Labs:  Results for orders placed or performed during the hospital encounter of 11/03/17 (from the past 48 hour(s))  Glucose, capillary     Status: Abnormal   Collection Time: 11/04/17 10:03 AM  Result Value Ref Range   Glucose-Capillary 215 (H) 65 - 99 mg/dL   Comment 1 Notify RN   Glucose, capillary     Status: Abnormal   Collection Time: 11/04/17 12:14 PM  Result Value Ref Range   Glucose-Capillary 195 (H) 65 - 99 mg/dL   Comment 1 Capillary Specimen   Glucose, capillary     Status: Abnormal   Collection Time: 11/04/17  5:39 PM  Result Value Ref Range    Glucose-Capillary 62 (L) 65 - 99 mg/dL  Glucose, capillary     Status: Abnormal   Collection Time: 11/04/17  8:36 PM  Result Value Ref Range   Glucose-Capillary 113 (H) 65 - 99 mg/dL  Glucose, capillary     Status: Abnormal   Collection Time: 11/05/17 12:33 AM  Result Value Ref Range   Glucose-Capillary 55 (L) 65 - 99 mg/dL   Comment 1 Notify RN   Glucose, capillary     Status: None   Collection Time: 11/05/17 12:53 AM  Result Value Ref Range   Glucose-Capillary 83 65 - 99 mg/dL  Glucose, capillary     Status: Abnormal   Collection Time: 11/05/17  8:04 AM  Result Value Ref Range   Glucose-Capillary 248 (H) 65 - 99 mg/dL  Glucose, capillary     Status: Abnormal   Collection Time: 11/05/17  1:01 PM  Result Value Ref Range   Glucose-Capillary 163 (H) 65 - 99 mg/dL  Glucose, capillary     Status: Abnormal   Collection Time: 11/05/17  4:11 PM  Result Value Ref Range   Glucose-Capillary 39 (LL) 65 - 99 mg/dL   Comment 1 Notify RN   Glucose, capillary     Status: Abnormal   Collection Time: 11/05/17  4:32 PM  Result Value Ref Range   Glucose-Capillary 41 (LL) 65 - 99 mg/dL  Glucose, capillary     Status: None   Collection Time: 11/05/17  5:06 PM  Result Value Ref Range   Glucose-Capillary 68 65 - 99 mg/dL  Glucose, capillary     Status: Abnormal   Collection Time: 11/05/17  5:35 PM  Result Value Ref Range   Glucose-Capillary 48 (L) 65 - 99 mg/dL  Glucose, capillary     Status: None   Collection Time: 11/05/17  6:12 PM  Result Value Ref Range   Glucose-Capillary 80 65 - 99 mg/dL  Glucose, capillary     Status: Abnormal   Collection Time: 11/05/17  9:32 PM  Result Value Ref Range   Glucose-Capillary 145 (H) 65 - 99 mg/dL  Glucose, capillary     Status: Abnormal   Collection Time: 11/06/17  8:24 AM  Result Value Ref Range   Glucose-Capillary 234 (H) 65 - 99 mg/dL    Current Facility-Administered Medications  Medication Dose Route Frequency Provider Last Rate Last Dose  .  acetaminophen (TYLENOL) tablet 650 mg  650 mg Oral Q6H PRN Coralyn Helling, MD   650 mg at 11/05/17 0816  . amphetamine-dextroamphetamine (ADDERALL) tablet 30 mg  30 mg Oral Daily Coralyn Helling, MD   30 mg at 11/05/17 1032  . doxepin (SINEQUAN) 10 MG/ML solution 100 mg  100 mg Oral QHS Coralyn Helling, MD   100 mg at 11/05/17 2129  . enoxaparin (LOVENOX) injection 40 mg  40 mg Subcutaneous Q24H Thomas, Tijo, DO   40 mg at 11/05/17 1653  . fentaNYL (SUBLIMAZE) injection 25 mcg  25 mcg Intravenous Q4H PRN Coralyn Helling, MD      . insulin aspart (novoLOG) injection 0-15 Units  0-15 Units Subcutaneous TID WC Coralyn Helling, MD   5 Units at 11/06/17 0837  . insulin aspart (novoLOG) injection 0-5 Units  0-5 Units Subcutaneous QHS Coralyn Helling, MD      . insulin detemir (LEVEMIR) injection 20 Units  20 Units Subcutaneous Daily Desai, Rahul P, PA-C      . magic mouthwash  5 mL Oral TID PRN Coralyn Helling, MD   5 mL at 11/05/17 2147  . metoCLOPramide (REGLAN) tablet 10 mg  10 mg Oral TID PRN Coralyn Helling, MD   10 mg at 11/06/17 0929  . oxyCODONE-acetaminophen (PERCOCET/ROXICET) 5-325 MG per tablet 1 tablet  1 tablet Oral Q4H PRN Lynnell Jude, MD   1 tablet at 11/06/17 [id number]  . pantoprazole (PROTONIX) EC tablet 40 mg  40 mg Oral BID AC Coralyn Helling, MD   40 mg at 11/05/17 1653  . phenol (CHLORASEPTIC) mouth spray 1 spray  1 spray Mouth/Throat PRN Coralyn Helling, MD   1 spray at 11/04/17 [phone]  . phosphorus (K PHOS NEUTRAL) tablet 500 mg  500 mg Oral BID WC Coralyn Helling, MD      . traZODone (DESYREL) tablet 150 mg  150 mg Oral QHS Thomas, Tijo, DO   150 mg at 11/05/17 2129    Musculoskeletal: Strength & Muscle Tone: within normal limits Gait & Station: normal Patient leans: N/A  Psychiatric Specialty Exam: Physical Exam  Nursing note and vitals reviewed. Constitutional: He is oriented to person, place, and time. He appears well-developed and well-nourished.  HENT:  Head: Normocephalic and atraumatic.  Neck:  Normal range of motion.  Respiratory: Effort normal.  Musculoskeletal: Normal range of motion.  Neurological: He is alert and oriented to person, place, and time.  Skin: No rash noted.  Psychiatric: He has a normal mood and affect. His speech is normal and behavior is normal. Judgment and thought content normal. Cognition and memory are normal.    Review of Systems  Constitutional: Negative for chills and fever.  Gastrointestinal: Positive for constipation. Negative for diarrhea, nausea and vomiting.  Musculoskeletal: Positive for joint pain (Right shoulder pain).  Psychiatric/Behavioral: Negative for depression, hallucinations, substance abuse and suicidal ideas. The patient is nervous/anxious. The patient does not have insomnia.     Blood pressure (!) 137/94, pulse (!) 102, temperature 98.3 F (36.8 C), temperature source Oral, resp. rate 16, height 6\' 1"  (1.854 m), weight 64.6 kg (142 lb 6.7 oz), SpO2 99 %.Body mass index is 18.79 kg/m.  General Appearance: Well Groomed, young, Caucasian male who is wearing hospital scrubs and sitting in a chair. NAD.   Eye Contact:  Good  Speech:  Clear and Coherent and Normal Rate  Volume:  Normal  Mood:  Euthymic  Affect:  Congruent and Full Range  Thought Process:  Goal Directed and Linear  Orientation:  Full (Time, Place,  and Person)  Thought Content:  Logical  Suicidal Thoughts:  No  Homicidal Thoughts:  No  Memory:  Immediate;   Good Recent;   Good Remote;   Good  Judgement:  Good  Insight:  Good  Psychomotor Activity:  Normal  Concentration:  Concentration: Good and Attention Span: Good  Recall:  Good  Fund of Knowledge:  Good  Language:  Good  Akathisia:  No  Handed:  Right  AIMS (if indicated):   N/A  Assets:  Communication Skills Desire for Improvement Housing  ADL's:  Intact  Cognition:  WNL  Sleep:   Okay   Assessment:  [name] is a 31 y.o. male who was admitted with DKA in the setting of medication  noncompliance and was IVC'd for refusing medical care. He denies SI today and is pleasant and cooperative with interview. He does not exhibit psychosis and is organized in thought process and behavior. He does not warrant inpatient psychiatric hospitalization at this time.   Treatment Plan Summary: -Patient does not require a sitter since he denies SI and contracts for safety.  -Patient is psychiatrically cleared. Psychiatry will sign off on patient at this time. Please consult psychiatry again as needed.    Disposition: No evidence of imminent risk to self or others at present.   Patient does not meet criteria for psychiatric inpatient admission.  Cherly Beach, DO 11/06/2017 9:39 AM

## 2017-11-06 NOTE — Plan of Care (Signed)
Patient is progressing in most goals, but not in managing health related issues as patient is refusing to have blood sugars and vs taken and to have telemetry reapplied throughout the night this shift.  Patient also has refused IV restart once IV was removed and refuses having lab work drawn. Patient has been encouraged by NP to allow VS and CBGs to be completed, patient states no, he will not allow any treatment to be completed until after 5:00 am.  RN will continue to monitor patient and call MD as needed. P.J. Henderson Newcomer, RN

## 2017-11-06 NOTE — Discharge Summary (Signed)
AGAINST MEDICAL ADVICE DISCHARGE SUMMARY  [name]:[id number] DOB: [birth year]  PCP: Fleet Contras, MD  Admit date: 11/03/2017 Discharge date: 11/09/2017  Time spent: 25 minutes  Admitted From: Home  Disposition: Home AGAINST MEDICAL ADVICE, close monitoring of blood sugars  Recommendations for Outpatient Follow-up:  1. Follow up with PCP in 1-2 weeks in the setting of hypoglycemic episode.  Left AGAINST MEDICAL ADVICE unable to ensure safe disposition   Home Health: No  Equipment/Devices: None  Discharge Diagnoses:  Active Hospital Problems   Diagnosis Date Noted  . Undifferentiated schizophrenia ([name])   . DKA (diabetic ketoacidosis) ([name]) 11/03/2017  . DKA, type 1 ([name]) 08/17/2017    Resolved Hospital Problems  No resolved problems to display.    Discharge Condition: Guarded, left AGAINST MEDICAL ADVICE CODE STATUS: Full Diet recommendation: Carb Modified  Vitals:   11/06/17 1100 11/06/17 1500  BP: 103/77 124/89  Pulse: (!) 124 (!) 120  Resp: 17 15  Temp: 99.5 F (37.5 C) 98.6 F (37 C)  SpO2:      History of present illness:  [name] is a 31 y.o. year old male with medical history significant for poorly healing requiring multiple admissions for DKA, and Schizophrenia who presented on 11/03/2017 with 3 days of worsening dyspnea and diarrhea and was found to have DKA.  Patient had stopped taking his Levemir 3-4 days prior because he was unable to afford his medications.  He lives with roommates who called EMS because of his worsening shortness of breath.  Of note, Patient was previously admitted on  10/21/2017 -10/23/2017 for DKA and ventilator dependent respiratory failure.  Hospital Course:  Principal Problem:   Undifferentiated schizophrenia ([name]) Active Problems:   DKA, type 1 ([name])   DKA (diabetic ketoacidosis) ([name])   DKA secondary to poorly controlled insulin dependent diabetes  Upon arrival to ED labs notable for WBC: 26.6,  hemoglobin 10.1, ,Glucose greater than 600, lactic acid 3.83, sodium 125, potassium 5.7, CO2< 7, Cl:84, anion gap high but not calculated (CO2 less than 7) creatinine 2.47(baseline 0.6-0.9).  Patient was given normal saline boluses, IV potassium, started on insulin drip.  Patient was admitted to stepdown unit.  Patient's low potassium and low phosphorus was repleted as necessary.  Patient tolerated transition to oral Lantus and sliding scale and discontinuation of IV insulin drip per DKA protocol.  While on Lantus and sliding scale NovoLog patient became symptomatic with hypoglycemic episode in the low 30s.  On further review patient's outpatient dose of Levemir 30 units a day however he is not been adherent to that regimen.  His Levemir was decreased to 20 units and recommended to decrease sliding scale as well.  Given some concern for suicidal ideation upon initial presentation to hospital psychiatry was consulted however they deemed the patient to be stable from a psychiatric standpoint.  At that point patient wished to leave AGAINST MEDICAL ADVICE.  It was explained to patient the importance of monitoring his blood sugar in light of his recent hypoglycemic episodes and unclear plan for his home insulin regimen.  Patient understood these risks and left AGAINST MEDICAL ADVICE.  Schizophrenia Patient is nonadherent with his insulin regimen.  There was some concerns for suicidal ideation: However, patient was evaluated by psychiatry during admission.  Patient did not exhibit any psychosis and upon their evaluation deemed to have organized thought process and behavior.  Patient did not warrant inpatient psychiatric hospitalization.  His IVC was discontinued. Consultations: Psychiatry  Procedures/Studies: Dg Shoulder  Right  Result Date: 10/22/2017 CLINICAL DATA:  Six months of right shoulder pain without known injury. History of diabetes and arthritis. EXAM: RIGHT SHOULDER - 2+ VIEW COMPARISON:  Right  shoulder series of July 06, 2017 FINDINGS: The bones are subjectively adequately mineralized. There is no acute fracture or dislocation. The joint spaces are well maintained. The soft tissues are unremarkable. IMPRESSION: There is no acute or significant chronic bony abnormality of the right shoulder. Electronically Signed   By: David  [place] M.D.   On: 10/22/2017 12:44   Ct Head Wo Contrast  Result Date: 10/20/2017 CLINICAL DATA:  Found down at home, unresponsive. EXAM: CT HEAD WITHOUT CONTRAST TECHNIQUE: Contiguous axial images were obtained from the base of the skull through the vertex without intravenous contrast. COMPARISON:  CT HEAD August 17, 2017 FINDINGS: BRAIN: No intraparenchymal hemorrhage, mass effect nor midline shift. The ventricles and sulci are normal. No acute large vascular territory infarcts. Subcentimeter hypodensity RIGHT basal ganglia is unchanged. No abnormal extra-axial fluid collections. Basal cisterns are patent. VASCULAR: Unremarkable. SKULL/SOFT TISSUES: No skull fracture. No significant soft tissue swelling. Small residual LEFT posterior scalp hematoma. ORBITS/SINUSES: The included ocular globes and orbital contents are normal.The mastoid aircells and included paranasal sinuses are well-aerated. OTHER: Included face demonstrates large cystic appearing structure LEFT oral cavity contiguous with maxilla. Poor dentition. Life-support lines in place. IMPRESSION: 1. No acute intracranial process . 2. 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 3. Large cystic structure within the LEFT oral cavity (possibly edematous tongue), possible odontogenic given poor dentition. Recommend direct inspection and, CT face with contrast as clinically today. Electronically Signed   By: Awilda Metro M.D.   On: 10/20/2017 23:47   Dg Esophagus  Result Date: 10/12/2017 CLINICAL DATA:  Dysphagia.  Difficulty swallowing. EXAM: ESOPHOGRAM/BARIUM SWALLOW TECHNIQUE: Single  contrast examination was performed using  thin barium. FLUOROSCOPY TIME:  Fluoroscopy Time:  1 minutes 18 seconds Radiation Exposure Index (if provided by the fluoroscopic device): Number of Acquired Spot Images: 0 COMPARISON:  None. FINDINGS: Esophageal mucosa and motility normal. No stricture or mass lesion. Negative for hiatal hernia or reflux Barium tablet passed readily into the stomach without delay. IMPRESSION: Negative esophagram. Electronically Signed   By: Marlan Palau M.D.   On: 10/12/2017 13:57   Dg Chest Port 1 View  Result Date: 11/03/2017 CLINICAL DATA:  Shortness of Breath EXAM: PORTABLE CHEST 1 VIEW COMPARISON:  10/21/2017 FINDINGS: Improving aeration with decreasing right basilar opacity. No confluent opacities currently. Heart is normal size. No effusions. IMPRESSION: No active disease. Electronically Signed   By: Charlett Nose M.D.   On: 11/03/2017 07:19   Dg Chest Port 1 View  Result Date: 10/21/2017 CLINICAL DATA:  31 year old male admitted with diabetic ketoacidosis, bradycardia, hypotension. EXAM: PORTABLE CHEST 1 VIEW COMPARISON:  10/20/2017 and earlier. FINDINGS: Portable AP semi upright view at 0427 hours. Extubated and enteric tube removed. Stable right IJ central line. Normal cardiac size and mediastinal contours. Allowing for portable technique the left lung remains clear, but there is new patchy and confluent right infrahilar and lung base opacity. No pneumothorax or pleural effusion. Negative visible bowel gas pattern. IMPRESSION: 1. Extubated and enteric tube removed. Stable right IJ central line. 2. New confluent right lung base opacity. Top differential considerations are Aspiration and Pneumonia. 3. No pleural effusion. Electronically Signed   By: Odessa Fleming M.D.   On: 10/21/2017 07:05   Dg Chest Portable 1 View  Result Date: 10/20/2017 CLINICAL DATA:  Initial evaluation for central line placement. EXAM: PORTABLE CHEST 1 VIEW COMPARISON:  Prior radiograph from  10/11/2017. FINDINGS: Endotracheal tube in place with tip positioned 7.8 cm above the carina. Right IJ approach centra venous catheter in place with tip overlying the proximal right atrium. Enteric tube courses into the abdomen. Cardiac mediastinal silhouettes within normal limits. Lungs hypoinflated. No focal infiltrates. No pulmonary edema or pleural effusion. No appreciable pneumothorax, although the right lung apex is incompletely visualized. No acute osseus abnormality. IMPRESSION: 1. Right IJ approach centra venous catheter tip overlying the proximal right atrium. 2. Tip of endotracheal tube approximately 7.7 cm above the carina. 3. No radiographic evidence for active cardiopulmonary disease. Electronically Signed   By: Rise Mu M.D.   On: 10/20/2017 22:48   Dg Chest Port 1 View  Result Date: 10/11/2017 CLINICAL DATA:  31 year old male with diabetic ketoacidosis. EXAM: PORTABLE CHEST 1 VIEW COMPARISON:  Chest radiograph dated 10/11/2017 FINDINGS: Left IJ central venous line with tip over the central SVC close to the cavoatrial junction. Mild prominence of the hilar vasculature. Small left infrahilar hazy density may represent atelectatic changes or crowding of the prominent vasculature although infiltrate is not excluded. Clinical correlation is recommended. PA and lateral views of the chest may provide better evaluation. There is no pleural effusion or pneumothorax. The cardiac silhouette is within normal limits. No acute osseous pathology. IMPRESSION: 1. Left IJ central venous catheter with tip close to the cavoatrial junction. No pneumothorax. 2. Patchy area of hazy density in the left infrahilar region may represent atelectasis or vascular crowding. Infiltrate not excluded. Clinical correlation is recommended. PA and lateral views of the chest may provide better evaluation. Electronically Signed   By: Elgie Collard M.D.   On: 10/11/2017 03:52   Dg Chest Port 1 View  Result Date:  10/11/2017 CLINICAL DATA:  31 year old male with sepsis. EXAM: PORTABLE CHEST 1 VIEW COMPARISON:  Chest radiograph dated 09/14/2017 FINDINGS: The heart size and mediastinal contours are within normal limits. Both lungs are clear. The visualized skeletal structures are unremarkable. IMPRESSION: No active disease. Electronically Signed   By: Elgie Collard M.D.   On: 10/11/2017 01:16   Dg Abd Portable 1 View  Result Date: 10/20/2017 CLINICAL DATA:  Patient status post OG tube placement. EXAM: PORTABLE ABDOMEN - 1 VIEW COMPARISON:  Abdominal radiograph 08/04/2017. FINDINGS: Lung bases are clear. Enteric tube tip and side-port project over the stomach. Nonobstructed bowel gas pattern. IMPRESSION: Enteric tube tip and side-port project over the stomach. Electronically Signed   By: Annia Belt M.D.   On: 10/20/2017 21:38     Discharge Exam: BP 124/89 (BP Location: Left Arm)   Pulse (!) 120   Temp 98.6 F (37 C) (Oral)   Resp 15   Ht 6\' 1"  (1.854 m)   Wt 64.6 kg (142 lb 6.7 oz)   SpO2 99%   BMI 18.79 kg/m    General: Sitting at bedside chair, resting carefully, in no distress Cardiovascular: Regular rate and rhythm, no murmurs rubs or gallops Respiratory: Clear breath sounds bilaterally, on room air, no rales or wheezes  Discharge Instructions You were cared for by a hospitalist during your hospital stay. If you have any questions about your discharge medications or the care you received while you were in the hospital after you are discharged, you can call the unit and asked to speak with the hospitalist on call if the hospitalist that took care of you is not available. Once you are discharged,  your primary care physician will handle any further medical issues. Please note that NO REFILLS for any discharge medications will be authorized once you are discharged, as it is imperative that you return to your primary care physician (or establish a relationship with a primary care physician if you do  not have one) for your aftercare needs so that they can reassess your need for medications and monitor your lab values.   Allergies as of 11/06/2017      Reactions   Sulfa Antibiotics Other (See Comments)   Unknown childhood allergy   Fentanyl Other (See Comments)   Patient had hallucinations with the medication      Medication List    ASK your doctor about these medications   amphetamine-dextroamphetamine 30 MG tablet Commonly known as:  ADDERALL Take 30 mg daily by mouth.   doxepin 10 MG/ML solution Commonly known as:  SINEQUAN Take 10 mLs by mouth at bedtime.   furosemide 20 MG tablet Commonly known as:  LASIX Take 20 tablets daily by mouth.   insulin aspart 100 UNIT/ML FlexPen Commonly known as:  NOVOLOG FLEXPEN Inject 0-15 Units into the skin See admin instructions. Check Blood Sugar 4 times per day > with meals and at bedtime CBG 70 - 120: 0 units CBG 121 - 150: 2 units CBG 151 - 200: 3 units CBG 201 - 250: 5 units CBG 251 - 300: 8 units CBG 301 - 350: 11 units CBG 351 - 400: 15 units   insulin detemir 100 UNIT/ML injection Commonly known as:  LEVEMIR Inject 0.3 mLs (30 Units total) into the skin daily.   metoCLOPramide 10 MG tablet Commonly known as:  REGLAN Take 1 tablet (10 mg total) by mouth 3 (three) times daily as needed for nausea or vomiting.   pantoprazole 40 MG tablet Commonly known as:  PROTONIX Take 1 tablet (40 mg total) by mouth 2 (two) times daily before a meal.   sennosides-docusate sodium 8.6-50 MG tablet Commonly known as:  SENOKOT-S Take 1 tablet by mouth as needed for constipation.   traZODone 150 MG tablet Commonly known as:  DESYREL Take 150 mg by mouth at bedtime.      Allergies  Allergen Reactions  . Sulfa Antibiotics Other (See Comments)    Unknown childhood allergy  . Fentanyl Other (See Comments)    Patient had hallucinations with the medication      The results of significant diagnostics from this hospitalization  (including imaging, microbiology, ancillary and laboratory) are listed below for reference.    Significant Diagnostic Studies: Dg Shoulder Right  Result Date: 10/22/2017 CLINICAL DATA:  Six months of right shoulder pain without known injury. History of diabetes and arthritis. EXAM: RIGHT SHOULDER - 2+ VIEW COMPARISON:  Right shoulder series of July 06, 2017 FINDINGS: The bones are subjectively adequately mineralized. There is no acute fracture or dislocation. The joint spaces are well maintained. The soft tissues are unremarkable. IMPRESSION: There is no acute or significant chronic bony abnormality of the right shoulder. Electronically Signed   By: David  [place] M.D.   On: 10/22/2017 12:44   Ct Head Wo Contrast  Result Date: 10/20/2017 CLINICAL DATA:  Found down at home, unresponsive. EXAM: CT HEAD WITHOUT CONTRAST TECHNIQUE: Contiguous axial images were obtained from the base of the skull through the vertex without intravenous contrast. COMPARISON:  CT HEAD August 17, 2017 FINDINGS: BRAIN: No intraparenchymal hemorrhage, mass effect nor midline shift. The ventricles and sulci are normal. No acute large vascular territory  infarcts. Subcentimeter hypodensity RIGHT basal ganglia is unchanged. No abnormal extra-axial fluid collections. Basal cisterns are patent. VASCULAR: Unremarkable. SKULL/SOFT TISSUES: No skull fracture. No significant soft tissue swelling. Small residual LEFT posterior scalp hematoma. ORBITS/SINUSES: The included ocular globes and orbital contents are normal.The mastoid aircells and included paranasal sinuses are well-aerated. OTHER: Included face demonstrates large cystic appearing structure LEFT oral cavity contiguous with maxilla. Poor dentition. Life-support lines in place. IMPRESSION: 1. No acute intracranial process . 2. 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 3. Large cystic structure within the LEFT oral cavity (possibly edematous tongue), possible  odontogenic given poor dentition. Recommend direct inspection and, CT face with contrast as clinically today. Electronically Signed   By: Awilda Metro M.D.   On: 10/20/2017 23:47   Dg Esophagus  Result Date: 10/12/2017 CLINICAL DATA:  Dysphagia.  Difficulty swallowing. EXAM: ESOPHOGRAM/BARIUM SWALLOW TECHNIQUE: Single contrast examination was performed using  thin barium. FLUOROSCOPY TIME:  Fluoroscopy Time:  1 minutes 18 seconds Radiation Exposure Index (if provided by the fluoroscopic device): Number of Acquired Spot Images: 0 COMPARISON:  None. FINDINGS: Esophageal mucosa and motility normal. No stricture or mass lesion. Negative for hiatal hernia or reflux Barium tablet passed readily into the stomach without delay. IMPRESSION: Negative esophagram. Electronically Signed   By: Marlan Palau M.D.   On: 10/12/2017 13:57   Dg Chest Port 1 View  Result Date: 11/03/2017 CLINICAL DATA:  Shortness of Breath EXAM: PORTABLE CHEST 1 VIEW COMPARISON:  10/21/2017 FINDINGS: Improving aeration with decreasing right basilar opacity. No confluent opacities currently. Heart is normal size. No effusions. IMPRESSION: No active disease. Electronically Signed   By: Charlett Nose M.D.   On: 11/03/2017 07:19   Dg Chest Port 1 View  Result Date: 10/21/2017 CLINICAL DATA:  31 year old male admitted with diabetic ketoacidosis, bradycardia, hypotension. EXAM: PORTABLE CHEST 1 VIEW COMPARISON:  10/20/2017 and earlier. FINDINGS: Portable AP semi upright view at 0427 hours. Extubated and enteric tube removed. Stable right IJ central line. Normal cardiac size and mediastinal contours. Allowing for portable technique the left lung remains clear, but there is new patchy and confluent right infrahilar and lung base opacity. No pneumothorax or pleural effusion. Negative visible bowel gas pattern. IMPRESSION: 1. Extubated and enteric tube removed. Stable right IJ central line. 2. New confluent right lung base opacity. Top  differential considerations are Aspiration and Pneumonia. 3. No pleural effusion. Electronically Signed   By: Odessa Fleming M.D.   On: 10/21/2017 07:05   Dg Chest Portable 1 View  Result Date: 10/20/2017 CLINICAL DATA:  Initial evaluation for central line placement. EXAM: PORTABLE CHEST 1 VIEW COMPARISON:  Prior radiograph from 10/11/2017. FINDINGS: Endotracheal tube in place with tip positioned 7.8 cm above the carina. Right IJ approach centra venous catheter in place with tip overlying the proximal right atrium. Enteric tube courses into the abdomen. Cardiac mediastinal silhouettes within normal limits. Lungs hypoinflated. No focal infiltrates. No pulmonary edema or pleural effusion. No appreciable pneumothorax, although the right lung apex is incompletely visualized. No acute osseus abnormality. IMPRESSION: 1. Right IJ approach centra venous catheter tip overlying the proximal right atrium. 2. Tip of endotracheal tube approximately 7.7 cm above the carina. 3. No radiographic evidence for active cardiopulmonary disease. Electronically Signed   By: Rise Mu M.D.   On: 10/20/2017 22:48   Dg Chest Port 1 View  Result Date: 10/11/2017 CLINICAL DATA:  31 year old male with diabetic ketoacidosis. EXAM: PORTABLE CHEST 1 VIEW COMPARISON:  Chest radiograph  dated 10/11/2017 FINDINGS: Left IJ central venous line with tip over the central SVC close to the cavoatrial junction. Mild prominence of the hilar vasculature. Small left infrahilar hazy density may represent atelectatic changes or crowding of the prominent vasculature although infiltrate is not excluded. Clinical correlation is recommended. PA and lateral views of the chest may provide better evaluation. There is no pleural effusion or pneumothorax. The cardiac silhouette is within normal limits. No acute osseous pathology. IMPRESSION: 1. Left IJ central venous catheter with tip close to the cavoatrial junction. No pneumothorax. 2. Patchy area of hazy  density in the left infrahilar region may represent atelectasis or vascular crowding. Infiltrate not excluded. Clinical correlation is recommended. PA and lateral views of the chest may provide better evaluation. Electronically Signed   By: Elgie Collard M.D.   On: 10/11/2017 03:52   Dg Chest Port 1 View  Result Date: 10/11/2017 CLINICAL DATA:  31 year old male with sepsis. EXAM: PORTABLE CHEST 1 VIEW COMPARISON:  Chest radiograph dated 09/14/2017 FINDINGS: The heart size and mediastinal contours are within normal limits. Both lungs are clear. The visualized skeletal structures are unremarkable. IMPRESSION: No active disease. Electronically Signed   By: Elgie Collard M.D.   On: 10/11/2017 01:16   Dg Abd Portable 1 View  Result Date: 10/20/2017 CLINICAL DATA:  Patient status post OG tube placement. EXAM: PORTABLE ABDOMEN - 1 VIEW COMPARISON:  Abdominal radiograph 08/04/2017. FINDINGS: Lung bases are clear. Enteric tube tip and side-port project over the stomach. Nonobstructed bowel gas pattern. IMPRESSION: Enteric tube tip and side-port project over the stomach. Electronically Signed   By: Annia Belt M.D.   On: 10/20/2017 21:38    Microbiology: Recent Results (from the past 240 hour(s))  Culture, blood (routine x 2)     Status: None   Collection Time: 11/03/17 12:15 PM  Result Value Ref Range Status   Specimen Description BLOOD RIGHT HAND  Final   Special Requests IN PEDIATRIC BOTTLE Blood Culture adequate volume  Final   Culture NO GROWTH 5 DAYS  Final   Report Status 11/08/2017 FINAL  Final  Culture, blood (single)     Status: None   Collection Time: 11/03/17  3:10 PM  Result Value Ref Range Status   Specimen Description BLOOD RIGHT HAND  Final   Special Requests IN PEDIATRIC BOTTLE Blood Culture adequate volume  Final   Culture NO GROWTH 5 DAYS  Final   Report Status 11/08/2017 FINAL  Final     Labs: Basic Metabolic Panel: Recent Labs  Lab 11/03/17 0211 11/03/17 0214  11/03/17 0628 11/03/17 2029 11/04/17 0252  NA 125* 122* 131* 137 136  K 5.7* 5.6* 4.1 3.7 3.3*  CL 84* 91* 98* 109 108  CO2 <7*  --  <7* 16* 20*  GLUCOSE 991* >700* 471* 145* 176*  BUN 29* 31* 24* 16 13  CREATININE 2.47* 1.00 2.04* 1.07 1.02  CALCIUM 8.8*  --  8.1* 8.0* 8.2*  MG  --   --  2.6* 2.3 2.3  PHOS  --   --  2.3* 1.3* 1.2*   Liver Function Tests: No results for input(s): AST, ALT, ALKPHOS, BILITOT, PROT, ALBUMIN in the last 168 hours. No results for input(s): LIPASE, AMYLASE in the last 168 hours. No results for input(s): AMMONIA in the last 168 hours. CBC: Recent Labs  Lab 11/03/17 0214 11/03/17 1214  WBC  --  26.6*  HGB 12.9* 10.1*  HCT 38.0* 29.5*  MCV  --  86.8  PLT  --  445*   Cardiac Enzymes: No results for input(s): CKTOTAL, CKMB, CKMBINDEX, TROPONINI in the last 168 hours. BNP: BNP (last 3 results) No results for input(s): BNP in the last 8760 hours.  ProBNP (last 3 results) No results for input(s): PROBNP in the last 8760 hours.  CBG: Recent Labs  Lab 11/05/17 2132 11/06/17 0824 11/06/17 1201 11/06/17 1540 11/06/17 1720  GLUCAP 145* 234* 201* 108* 93       Signed:  Laverna PeaceShayla D Dellene Mcgroarty, MD [facility] 11/09/2017, 1:39 PM

## 2017-11-08 LAB — CULTURE, BLOOD (ROUTINE X 2)
Culture: NO GROWTH
Special Requests: ADEQUATE

## 2017-11-08 LAB — CULTURE, BLOOD (SINGLE)
CULTURE: NO GROWTH
SPECIAL REQUESTS: ADEQUATE

## 2017-11-21 ENCOUNTER — Encounter (HOSPITAL_BASED_OUTPATIENT_CLINIC_OR_DEPARTMENT_OTHER): Payer: Medicare Other | Attending: Surgery

## 2017-12-02 ENCOUNTER — Encounter (HOSPITAL_COMMUNITY): Payer: Self-pay | Admitting: Emergency Medicine

## 2017-12-02 ENCOUNTER — Emergency Department (HOSPITAL_COMMUNITY): Payer: Medicare Other

## 2017-12-02 ENCOUNTER — Other Ambulatory Visit: Payer: Self-pay

## 2017-12-02 ENCOUNTER — Inpatient Hospital Stay (HOSPITAL_COMMUNITY)
Admission: EM | Admit: 2017-12-02 | Discharge: 2017-12-05 | DRG: 637 | Disposition: A | Discharge status: Home / Self Care | Payer: Medicare Other | Attending: Internal Medicine | Admitting: Internal Medicine

## 2017-12-02 DIAGNOSIS — N179 Acute kidney failure, unspecified: Secondary | ICD-10-CM | POA: Diagnosis present

## 2017-12-02 DIAGNOSIS — G9341 Metabolic encephalopathy: Secondary | ICD-10-CM | POA: Diagnosis present

## 2017-12-02 DIAGNOSIS — G4700 Insomnia, unspecified: Secondary | ICD-10-CM | POA: Diagnosis present

## 2017-12-02 DIAGNOSIS — L97529 Non-pressure chronic ulcer of other part of left foot with unspecified severity: Secondary | ICD-10-CM | POA: Diagnosis present

## 2017-12-02 DIAGNOSIS — Z79899 Other long term (current) drug therapy: Secondary | ICD-10-CM | POA: Diagnosis not present

## 2017-12-02 DIAGNOSIS — D649 Anemia, unspecified: Secondary | ICD-10-CM | POA: Diagnosis present

## 2017-12-02 DIAGNOSIS — E10621 Type 1 diabetes mellitus with foot ulcer: Secondary | ICD-10-CM | POA: Diagnosis present

## 2017-12-02 DIAGNOSIS — R571 Hypovolemic shock: Secondary | ICD-10-CM | POA: Diagnosis present

## 2017-12-02 DIAGNOSIS — K219 Gastro-esophageal reflux disease without esophagitis: Secondary | ICD-10-CM | POA: Diagnosis present

## 2017-12-02 DIAGNOSIS — E0811 Diabetes mellitus due to underlying condition with ketoacidosis with coma: Secondary | ICD-10-CM | POA: Diagnosis not present

## 2017-12-02 DIAGNOSIS — Z885 Allergy status to narcotic agent status: Secondary | ICD-10-CM

## 2017-12-02 DIAGNOSIS — F1010 Alcohol abuse, uncomplicated: Secondary | ICD-10-CM | POA: Diagnosis present

## 2017-12-02 DIAGNOSIS — I10 Essential (primary) hypertension: Secondary | ICD-10-CM | POA: Diagnosis present

## 2017-12-02 DIAGNOSIS — E1310 Other specified diabetes mellitus with ketoacidosis without coma: Secondary | ICD-10-CM | POA: Diagnosis not present

## 2017-12-02 DIAGNOSIS — E1010 Type 1 diabetes mellitus with ketoacidosis without coma: Secondary | ICD-10-CM | POA: Diagnosis not present

## 2017-12-02 DIAGNOSIS — F1210 Cannabis abuse, uncomplicated: Secondary | ICD-10-CM | POA: Diagnosis not present

## 2017-12-02 DIAGNOSIS — M25511 Pain in right shoulder: Secondary | ICD-10-CM | POA: Diagnosis present

## 2017-12-02 DIAGNOSIS — Z882 Allergy status to sulfonamides status: Secondary | ICD-10-CM | POA: Diagnosis not present

## 2017-12-02 DIAGNOSIS — E872 Acidosis: Secondary | ICD-10-CM | POA: Diagnosis not present

## 2017-12-02 DIAGNOSIS — Z9114 Patient's other noncompliance with medication regimen: Secondary | ICD-10-CM

## 2017-12-02 DIAGNOSIS — R680 Hypothermia, not associated with low environmental temperature: Secondary | ICD-10-CM | POA: Diagnosis present

## 2017-12-02 DIAGNOSIS — G8929 Other chronic pain: Secondary | ICD-10-CM | POA: Diagnosis not present

## 2017-12-02 DIAGNOSIS — E1110 Type 2 diabetes mellitus with ketoacidosis without coma: Secondary | ICD-10-CM | POA: Diagnosis present

## 2017-12-02 DIAGNOSIS — Z9119 Patient's noncompliance with other medical treatment and regimen: Secondary | ICD-10-CM

## 2017-12-02 DIAGNOSIS — Z833 Family history of diabetes mellitus: Secondary | ICD-10-CM | POA: Diagnosis not present

## 2017-12-02 DIAGNOSIS — F203 Undifferentiated schizophrenia: Secondary | ICD-10-CM | POA: Diagnosis present

## 2017-12-02 DIAGNOSIS — F17210 Nicotine dependence, cigarettes, uncomplicated: Secondary | ICD-10-CM | POA: Diagnosis present

## 2017-12-02 DIAGNOSIS — F329 Major depressive disorder, single episode, unspecified: Secondary | ICD-10-CM | POA: Diagnosis present

## 2017-12-02 DIAGNOSIS — M2550 Pain in unspecified joint: Secondary | ICD-10-CM | POA: Diagnosis not present

## 2017-12-02 DIAGNOSIS — M25519 Pain in unspecified shoulder: Secondary | ICD-10-CM | POA: Diagnosis not present

## 2017-12-02 DIAGNOSIS — R0682 Tachypnea, not elsewhere classified: Secondary | ICD-10-CM | POA: Diagnosis not present

## 2017-12-02 LAB — I-STAT CHEM 8, ED
BUN: 35 mg/dL — AB (ref 6–20)
CHLORIDE: 102 mmol/L (ref 101–111)
CREATININE: 1.7 mg/dL — AB (ref 0.61–1.24)
Calcium, Ion: 1.31 mmol/L (ref 1.15–1.40)
HCT: 37 % — ABNORMAL LOW (ref 39.0–52.0)
HEMOGLOBIN: 12.6 g/dL — AB (ref 13.0–17.0)
POTASSIUM: 5.7 mmol/L — AB (ref 3.5–5.1)
Sodium: 128 mmol/L — ABNORMAL LOW (ref 135–145)
TCO2: 5 mmol/L — ABNORMAL LOW (ref 22–32)

## 2017-12-02 LAB — CBC WITH DIFFERENTIAL/PLATELET
BASOS PCT: 0 %
Basophils Absolute: 0 10*3/uL (ref 0.0–0.1)
EOS ABS: 0 10*3/uL (ref 0.0–0.7)
Eosinophils Relative: 0 %
HCT: 38.4 % — ABNORMAL LOW (ref 39.0–52.0)
HEMOGLOBIN: 11.6 g/dL — AB (ref 13.0–17.0)
LYMPHS ABS: 1.9 10*3/uL (ref 0.7–4.0)
LYMPHS PCT: 9 %
MCH: 29.7 pg (ref 26.0–34.0)
MCHC: 30.2 g/dL (ref 30.0–36.0)
MCV: 98.2 fL (ref 78.0–100.0)
Monocytes Absolute: 1.4 10*3/uL — ABNORMAL HIGH (ref 0.1–1.0)
Monocytes Relative: 7 %
NEUTROS ABS: 17.3 10*3/uL — AB (ref 1.7–7.7)
Neutrophils Relative %: 84 %
PLATELETS: 566 10*3/uL — AB (ref 150–400)
RBC: 3.91 MIL/uL — ABNORMAL LOW (ref 4.22–5.81)
RDW: 13.9 % (ref 11.5–15.5)
WBC: 20.6 10*3/uL — ABNORMAL HIGH (ref 4.0–10.5)

## 2017-12-02 LAB — BASIC METABOLIC PANEL
BUN: 29 mg/dL — AB (ref 6–20)
BUN: 33 mg/dL — AB (ref 6–20)
CALCIUM: 7.1 mg/dL — AB (ref 8.9–10.3)
CHLORIDE: 93 mmol/L — AB (ref 101–111)
CO2: <7 mmol/L — ABNORMAL LOW (ref 22–32)
Calcium: 9.1 mg/dL (ref 8.9–10.3)
Chloride: 107 mmol/L (ref 101–111)
Creatinine, Ser: 2.91 mg/dL — ABNORMAL HIGH (ref 0.61–1.24)
Creatinine, Ser: 2.49 mg/dL — ABNORMAL HIGH (ref 0.61–1.24)
GFR calc Af Amer: 31 mL/min — ABNORMAL LOW (ref >60)
GFR calc Af Amer: 37 mL/min — ABNORMAL LOW (ref >60)
GFR calc non Af Amer: 27 mL/min — ABNORMAL LOW (ref >60)
GFR, EST NON AFRICAN AMERICAN: 32 mL/min — AB (ref >60)
GLUCOSE: 910 mg/dL — AB (ref 65–99)
GLUCOSE: 561 mg/dL — AB (ref 65–99)
POTASSIUM: 5.9 mmol/L — AB (ref 3.5–5.1)
POTASSIUM: 4.2 mmol/L (ref 3.5–5.1)
SODIUM: 134 mmol/L — AB (ref 135–145)
Sodium: 130 mmol/L — ABNORMAL LOW (ref 135–145)

## 2017-12-02 LAB — TROPONIN I

## 2017-12-02 LAB — PHOSPHORUS: Phosphorus: 8.5 mg/dL — ABNORMAL HIGH (ref 2.5–4.6)

## 2017-12-02 LAB — MAGNESIUM: Magnesium: 2.6 mg/dL — ABNORMAL HIGH (ref 1.7–2.4)

## 2017-12-02 LAB — ETHANOL

## 2017-12-02 LAB — GLUCOSE, CAPILLARY
GLUCOSE-CAPILLARY: 508 mg/dL — AB (ref 65–99)
Glucose-Capillary: >600 mg/dL (ref 65–99)

## 2017-12-02 LAB — CBG MONITORING, ED: Glucose-Capillary: >600 mg/dL (ref 65–99)

## 2017-12-02 LAB — BETA-HYDROXYBUTYRIC ACID

## 2017-12-02 LAB — I-STAT CG4 LACTIC ACID, ED: LACTIC ACID, VENOUS: 4.3 mmol/L — AB (ref 0.5–1.9)

## 2017-12-02 LAB — PROCALCITONIN: PROCALCITONIN: 0.17 ng/mL

## 2017-12-02 LAB — MRSA PCR SCREENING: MRSA by PCR: NEGATIVE

## 2017-12-02 LAB — LACTIC ACID, PLASMA: Lactic Acid, Venous: 1.5 mmol/L (ref 0.5–1.9)

## 2017-12-02 MED ORDER — SODIUM CHLORIDE 0.9 % IV BOLUS (SEPSIS)
1000.0000 mL | Freq: Once | INTRAVENOUS | Status: AC
  Administered 2017-12-02: 1000 mL via INTRAVENOUS

## 2017-12-02 MED ORDER — SODIUM CHLORIDE 0.9 % IV SOLN
0.0000 ug/min | INTRAVENOUS | Status: DC
  Administered 2017-12-02: 20 ug/min via INTRAVENOUS
  Administered 2017-12-03: 30 ug/min via INTRAVENOUS
  Administered 2017-12-03: 50 ug/min via INTRAVENOUS
  Filled 2017-12-02 (×3): qty 1

## 2017-12-02 MED ORDER — SODIUM CHLORIDE 0.9 % IV SOLN
INTRAVENOUS | Status: DC
  Administered 2017-12-02: 20:00:00 via INTRAVENOUS

## 2017-12-02 MED ORDER — PHENYLEPHRINE 40 MCG/ML (10ML) SYRINGE FOR IV PUSH (FOR BLOOD PRESSURE SUPPORT)
200.0000 ug | PREFILLED_SYRINGE | Freq: Once | INTRAVENOUS | Status: AC
  Administered 2017-12-02 (×2): 200 ug via INTRAVENOUS

## 2017-12-02 MED ORDER — DEXTROSE-NACL 5-0.45 % IV SOLN
INTRAVENOUS | Status: DC
  Administered 2017-12-03: 11:00:00 via INTRAVENOUS

## 2017-12-02 MED ORDER — PANTOPRAZOLE SODIUM 40 MG IV SOLR
40.0000 mg | Freq: Two times a day (BID) | INTRAVENOUS | Status: DC
  Administered 2017-12-02 – 2017-12-04 (×4): 40 mg via INTRAVENOUS
  Filled 2017-12-02 (×4): qty 40

## 2017-12-02 MED ORDER — TRAZODONE HCL 150 MG PO TABS: 150.0000 mg | ORAL_TABLET | Freq: Every day | ORAL | Status: DC

## 2017-12-02 MED ORDER — SODIUM CHLORIDE 0.9 % IV SOLN: INTRAVENOUS | Status: DC

## 2017-12-02 MED ORDER — HEPARIN SODIUM (PORCINE) 5000 UNIT/ML IJ SOLN
5000.0000 [IU] | Freq: Three times a day (TID) | INTRAMUSCULAR | Status: DC
  Administered 2017-12-02 – 2017-12-05 (×6): 5000 [IU] via SUBCUTANEOUS
  Filled 2017-12-02 (×9): qty 1

## 2017-12-02 MED ORDER — SODIUM BICARBONATE 8.4 % IV SOLN
50.0000 meq | Freq: Once | INTRAVENOUS | Status: AC
  Administered 2017-12-02: 50 meq via INTRAVENOUS

## 2017-12-02 MED ORDER — ONDANSETRON HCL 4 MG/2ML IJ SOLN
4.0000 mg | Freq: Four times a day (QID) | INTRAMUSCULAR | Status: DC | PRN
  Administered 2017-12-02: 4 mg via INTRAVENOUS
  Filled 2017-12-02: qty 2

## 2017-12-02 MED ORDER — DEXTROSE-NACL 5-0.45 % IV SOLN: INTRAVENOUS | Status: DC

## 2017-12-02 MED ORDER — PHENYLEPHRINE 40 MCG/ML (10ML) SYRINGE FOR IV PUSH (FOR BLOOD PRESSURE SUPPORT)
PREFILLED_SYRINGE | INTRAVENOUS | Status: AC
  Administered 2017-12-02: 200 ug via INTRAVENOUS
  Filled 2017-12-02: qty 10

## 2017-12-02 MED ORDER — PHENYLEPHRINE 40 MCG/ML (10ML) SYRINGE FOR IV PUSH (FOR BLOOD PRESSURE SUPPORT)
PREFILLED_SYRINGE | INTRAVENOUS | Status: AC
  Filled 2017-12-02: qty 10

## 2017-12-02 MED ORDER — SODIUM BICARBONATE 8.4 % IV SOLN
100.0000 meq | Freq: Once | INTRAVENOUS | Status: AC
  Administered 2017-12-02: 100 meq via INTRAVENOUS
  Filled 2017-12-02: qty 100

## 2017-12-02 MED ORDER — STERILE WATER FOR INJECTION IV SOLN
INTRAVENOUS | Status: DC
  Administered 2017-12-02: 21:00:00 via INTRAVENOUS
  Filled 2017-12-02 (×2): qty 850

## 2017-12-02 MED ORDER — PHENYLEPHRINE 200 MCG/ML (NON-ED) FOR PRIAPISM
200.0000 ug | Freq: Once | INTRAMUSCULAR | Status: DC
  Filled 2017-12-02: qty 10

## 2017-12-02 MED ORDER — SODIUM CHLORIDE 0.9 % IV SOLN
INTRAVENOUS | Status: DC
  Administered 2017-12-02: 5.4 [IU]/h via INTRAVENOUS
  Administered 2017-12-03: 16.1 [IU]/h via INTRAVENOUS
  Administered 2017-12-03: 1.7 [IU]/h via INTRAVENOUS
  Filled 2017-12-02 (×2): qty 1

## 2017-12-02 MED ORDER — SODIUM BICARBONATE 8.4 % IV SOLN
INTRAVENOUS | Status: AC
  Filled 2017-12-02: qty 50

## 2017-12-02 MED ORDER — SODIUM CHLORIDE 0.9 % IV BOLUS (SEPSIS)
2000.0000 mL | Freq: Once | INTRAVENOUS | Status: AC
  Administered 2017-12-02: 2000 mL via INTRAVENOUS

## 2017-12-02 MED ORDER — TRAZODONE HCL 50 MG PO TABS
150.0000 mg | ORAL_TABLET | Freq: Every day | ORAL | Status: DC
  Administered 2017-12-03 – 2017-12-04 (×2): 150 mg via ORAL
  Filled 2017-12-02 (×2): qty 1

## 2017-12-02 NOTE — ED Provider Notes (Signed)
[facility] 44[facility] MEDICAL ICU Provider Note   CSN: [id number] Arrival date & time: 12/02/17  1922     History   Chief Complaint Chief Complaint  Patient presents with  . Hyperglycemia    HPI [name] is a 31 y.o. male.  HPI 31 year old Caucasian male presents to the ED with a past medical history significant for insulin-dependent diabetes who is noncompliant with medications with frequent admissions for DKA, schizophrenia, polysubstance abuse, depression for hyperglycemia and altered mental status.  Patient was brought in by EMS which was called by friends who found patient unresponsive at home.  No known last normal.  In route by EMS patient was noted to be hypothermic, hypotensive and hyperglycemic.  They noted Kussmaul breathing.   Remainder of history, ROS, and physical exam limited due to patient's condition (AMS). Additional information was obtained from EMS.   Level V Caveat.    Past Medical History:  Diagnosis Date  . Anemia   . Anxiety   . Arthritis    "both hips; both shoulders" (11/05/2017)  . Chicken pox   . Childhood asthma   . Chronic pain   . Depression   . DKA (diabetic ketoacidoses) ([name]) 11/18/2014  . DKA (diabetic ketoacidoses) ([name]) 07/05/2017  . GERD (gastroesophageal reflux disease)   . Hypertension   . Migraine    "a few/year" (06/06/2017)  . Noncompliance with medication regimen   . Pneumonia    "several times" (06/06/2017)  . Polysubstance abuse ([name])   . Schizo affective schizophrenia ([name])   . Scoliosis   . Type I diabetes mellitus [facility])     Patient Active Problem List   Diagnosis Date Noted  . DKA (diabetic ketoacidosis) ([name]) 11/03/2017  . Pressure injury of skin 10/11/2017  . Encephalopathy acute   . Hyperbilirubinemia 09/14/2017  . Heel ulcer ([name]) 09/14/2017  . Noncompliance with medication regimen   . Hypokalemia   . Non healing left heel wound   . Cellulitis   . DKA, type 1 ([name]) 08/17/2017  .  Encounter for imaging study to confirm orogastric (OG) tube placement   . Acute respiratory acidosis   . DKA (diabetic ketoacidoses) ([name]) 08/04/2017  . Acute respiratory failure with hypoxia ([name])   . Seizure ([name])   . Diabetic ketoacidosis ([name]) 07/30/2017  . Normocytic anemia 07/05/2017  . Numbness of right hand 07/05/2017  . Elevated troponin I measurement   . Positive D dimer   . DKA, type 1, not at goal [facility]) 07/01/2017  . Hyperkalemia   . AKI (acute kidney injury) ([name])   . Medically noncompliant   . Hyperglycemia 06/16/2017  . Nausea & vomiting 06/15/2017  . Abdominal pain 06/15/2017  . Sepsis ([name]) 06/07/2017  . Chest pain 06/07/2017  . GERD (gastroesophageal reflux disease) 06/07/2017  . Substance abuse ([name]) 05/30/2016  . Substance induced mood disorder ([name]) 05/30/2016  . Tobacco use disorder 12/24/2015  . DM hyperosmolarity type I, uncontrolled ([name]) 12/19/2015  . Generalized headache 07/22/2015  . Bilateral hip bursitis 05/26/2015  . Depression   . Generalized anxiety disorder 03/29/2015  . Undifferentiated schizophrenia ([name])   . Hip pain, bilateral 11/18/2014  . Dehydration 06/17/2012    Past Surgical History:  Procedure Laterality Date  . CARDIAC CATHETERIZATION  11/2016  . INCISION AND DRAINAGE ABSCESS Left 11/2011   "MRSA removed off my thumb"       Home Medications    Prior to Admission medications   Medication Sig Start Date End Date  Taking? Authorizing Provider  amphetamine-dextroamphetamine (ADDERALL) 30 MG tablet Take 30 mg daily by mouth. 10/02/17   [provider]  doxepin (SINEQUAN) 10 MG/ML solution Take 10 mLs by mouth at bedtime. 07/11/17   [provider]  furosemide (LASIX) 20 MG tablet Take 20 tablets daily by mouth. 10/25/17   [provider]  insulin aspart (NOVOLOG FLEXPEN) 100 UNIT/ML FlexPen Inject 0-15 Units into the skin See admin instructions. Check Blood Sugar 4 times per day > with meals and at  bedtime CBG 70 - 120: 0 units CBG 121 - 150: 2 units CBG 151 - 200: 3 units CBG 201 - 250: 5 units CBG 251 - 300: 8 units CBG 301 - 350: 11 units CBG 351 - 400: 15 units 09/15/17   Rai, Ripudeep K, MD  insulin detemir (LEVEMIR) 100 UNIT/ML injection Inject 0.3 mLs (30 Units total) into the skin daily. Patient not taking: Reported on 11/04/2017 09/15/17   Rai, Delene Ruffini, MD  metoCLOPramide (REGLAN) 10 MG tablet Take 1 tablet (10 mg total) by mouth 3 (three) times daily as needed for nausea or vomiting. 09/15/17   Rai, Ripudeep K, MD  pantoprazole (PROTONIX) 40 MG tablet Take 1 tablet (40 mg total) by mouth 2 (two) times daily before a meal. 10/13/17   Danford, Earl Lites, MD  sennosides-docusate sodium (SENOKOT-S) 8.6-50 MG tablet Take 1 tablet by mouth as needed for constipation.    [provider]  traZODone (DESYREL) 150 MG tablet Take 150 mg by mouth at bedtime. 06/26/17   [provider]    Family History Family History  Problem Relation Age of Onset  . Diabetes Mother     Social History Social History   Tobacco Use  . Smoking status: Current Every Day Smoker    Packs/day: 0.50    Years: 9.00    Pack years: 4.50    Types: Cigarettes  . Smokeless tobacco: Never Used  Substance Use Topics  . Alcohol use: No  . Drug use: Yes    Types: Marijuana    Comment: 11/05/2017 "nonesince 2012"; pt denies hx of cocaine and methamphetamines use on 11/05/2017     Allergies   Sulfa antibiotics and Fentanyl   Review of Systems Review of Systems  Unable to perform ROS: Mental status change     Physical Exam Updated Vital Signs BP 119/68   Pulse (!) 106   Temp (!) 91.6 F (33.1 C) (Rectal)   Resp 18   SpO2 100%   Physical Exam  Constitutional: He is oriented to person, place, and time. He appears lethargic. He appears cachectic.  Non-toxic appearance. He appears ill. He appears distressed.  HENT:  Head: Normocephalic and atraumatic.  Mouth/Throat:  Oropharynx is clear and moist.  Eyes: Conjunctivae are normal. Pupils are equal, round, and reactive to light. Right eye exhibits no discharge. Left eye exhibits no discharge.  Neck: Normal range of motion. Neck supple.  Cardiovascular: Regular rhythm and normal heart sounds. Exam reveals no gallop and no friction rub.  No murmur heard. Tachycardia noted.  Poor capillary refill.  Pulmonary/Chest: Breath sounds normal. No stridor. He has no wheezes. He exhibits no tenderness.  kusmal breathing noted.  Abdominal: Soft. Bowel sounds are normal. He exhibits no distension. There is no tenderness. There is no rebound and no guarding.  Musculoskeletal: Normal range of motion. He exhibits no tenderness.  Lymphadenopathy:    He has no cervical adenopathy.  Neurological: He is oriented to person, place, and time.  He appears lethargic. GCS eye subscore is 1. GCS verbal subscore is 3. GCS motor subscore is 5.  Skin: Skin is dry. Capillary refill takes 2 to 3 seconds. No rash noted.  Patient is cool to touch.  Psychiatric: His behavior is normal. Judgment and thought content normal.  Nursing note and vitals reviewed.     Constitutional: He appears well-developed and well-nourished. He appears lethargic. He appears ill. No distress.  HENT:  Head: Normocephalic and atraumatic.  Nose: Nose normal.  Eyes: Pupils are equal, round, and reactive to light. Conjunctivae and EOM are normal. Right eye exhibits no discharge. Left eye exhibits no discharge. No scleral icterus.  Neck: Normal range of motion. Neck supple.  Cardiovascular: Regular rhythm.  Bradycardia present.  Exam reveals no gallop and no friction rub.   No murmur heard. Pulmonary/Chest: Effort normal and breath sounds normal. No stridor. No respiratory distress. He has no rales.  Abdominal: Soft. He exhibits no distension. There is no tenderness.  Musculoskeletal: He exhibits no edema or tenderness.  Neurological: He appears lethargic. GCS eye  subscore is 1. GCS verbal subscore is 2. GCS motor subscore is 4.  Skin: Skin is dry. No rash noted. He is not diaphoretic. No erythema.  Cool to the touch  Psychiatric: He has a normal mood and affect.  Vitals reviewed.    ED Treatments / Results  Labs (all labs ordered are listed, but only abnormal results are displayed) Labs Reviewed  BASIC METABOLIC PANEL - Abnormal; Notable for the following components:      Result Value   Sodium 130 (*)    Potassium 5.9 (*)    Chloride 93 (*)    CO2 <7 (*)    Glucose, Bld 910 (*)    BUN 29 (*)    Creatinine, Ser 2.91 (*)    GFR calc non Af Amer 27 (*)    GFR calc Af Amer 31 (*)    All other components within normal limits  CBC WITH DIFFERENTIAL/PLATELET - Abnormal; Notable for the following components:   WBC 20.6 (*)    RBC 3.91 (*)    Hemoglobin 11.6 (*)    HCT 38.4 (*)    Platelets 566 (*)    Neutro Abs 17.3 (*)    Monocytes Absolute 1.4 (*)    All other components within normal limits  BETA-HYDROXYBUTYRIC ACID - Abnormal; Notable for the following components:   Beta-Hydroxybutyric Acid >8.00 (*)    All other components within normal limits  MAGNESIUM - Abnormal; Notable for the following components:   Magnesium 2.6 (*)    All other components within normal limits  PHOSPHORUS - Abnormal; Notable for the following components:   Phosphorus 8.5 (*)    All other components within normal limits  GLUCOSE, CAPILLARY - Abnormal; Notable for the following components:   Glucose-Capillary >600 (*)    All other components within normal limits  GLUCOSE, CAPILLARY - Abnormal; Notable for the following components:   Glucose-Capillary >600 (*)    All other components within normal limits  I-STAT CHEM 8, ED - Abnormal; Notable for the following components:   Sodium 128 (*)    Potassium 5.7 (*)    BUN 35 (*)    Creatinine, Ser 1.70 (*)    Glucose, Bld >700 (*)    TCO2 5 (*)    Hemoglobin 12.6 (*)    HCT 37.0 (*)    All other components  within normal limits  I-STAT CG4 LACTIC ACID, ED -  Abnormal; Notable for the following components:   Lactic Acid, Venous 4.30 (*)    All other components within normal limits  CBG MONITORING, ED - Abnormal; Notable for the following components:   Glucose-Capillary >600 (*)    All other components within normal limits  CULTURE, BLOOD (ROUTINE X 2)  CULTURE, BLOOD (ROUTINE X 2)  MRSA PCR SCREENING  PROCALCITONIN  ETHANOL  TROPONIN I  URINALYSIS, ROUTINE W REFLEX MICROSCOPIC  CBC  MAGNESIUM  PHOSPHORUS  PROCALCITONIN  LACTIC ACID, PLASMA  LACTIC ACID, PLASMA  RAPID URINE DRUG SCREEN, HOSP PERFORMED  BASIC METABOLIC PANEL  BASIC METABOLIC PANEL  BASIC METABOLIC PANEL  TROPONIN I  TROPONIN I  BASIC METABOLIC PANEL  I-STAT VENOUS BLOOD GAS, ED    EKG  EKG Interpretation  Date/Time:  Sunday December 02 2017 19:42:36 EST Ventricular Rate:  95 PR Interval:    QRS Duration: 106 QT Interval:  391 QTC Calculation: 492 R Axis:   20 Text Interpretation:  Sinus rhythm Prolonged QT interval Large T waves similar to nov 2018 Confirmed by Pricilla LovelessGoldston, Scott [phone]) on 12/02/2017 8:21:25 PM       Radiology Dg Chest Portable 1 View  Result Date: 12/02/2017 CLINICAL DATA:  Hyperglycemia.  Hypotension. EXAM: PORTABLE CHEST 1 VIEW COMPARISON:  November 03, 2017 FINDINGS: There is no edema or consolidation. The heart size and pulmonary vascularity are normal. No adenopathy. No evident bone lesions. IMPRESSION: No edema or consolidation. Electronically Signed   By: Bretta BangWilliam  Woodruff III M.D.   On: 12/02/2017 19:57    Procedures .Critical Care Performed by: Rise MuLeaphart, Zylan Almquist T, PA-C Authorized by: Rise MuLeaphart, Lutie Pickler T, PA-C   Critical care provider statement:    Critical care time (minutes):  55   Critical care was necessary to treat or prevent imminent or life-threatening deterioration of the following conditions:  Endocrine crisis (dka)   Critical care was time spent personally by me  on the following activities:  Development of treatment plan with patient or surrogate, discussions with consultants, evaluation of patient's response to treatment, examination of patient, interpretation of cardiac output measurements, obtaining history from patient or surrogate, ordering and performing treatments and interventions, ordering and review of laboratory studies, ordering and review of radiographic studies, pulse oximetry, re-evaluation of patient's condition and review of old charts   (including critical care time)  Medications Ordered in ED Medications  sodium bicarbonate 150 mEq in sterile water 1,000 mL infusion ( Intravenous Transfusing/Transfer 12/02/17 2112)  0.9 %  sodium chloride infusion ( Intravenous Transfusing/Transfer 12/02/17 2112)  dextrose 5 %-0.45 % sodium chloride infusion (not administered)  heparin injection 5,000 Units (5,000 Units Subcutaneous Given 12/02/17 2246)  insulin regular (NOVOLIN R,HUMULIN R) 100 Units in sodium chloride 0.9 % 100 mL (1 Units/mL) infusion (16.2 Units/hr Intravenous Rate/Dose Change 12/02/17 2242)  sodium chloride 0.9 % bolus 1,000 mL (not administered)    And  0.9 %  sodium chloride infusion (not administered)  phenylephrine (NEO-SYNEPHRINE) 10 mg in sodium chloride 0.9 % 250 mL (0.04 mg/mL) infusion (60 mcg/min Intravenous Transfusing/Transfer 12/02/17 2112)  pantoprazole (PROTONIX) injection 40 mg (40 mg Intravenous Given 12/02/17 2246)  ondansetron (ZOFRAN) injection 4 mg (4 mg Intravenous Given 12/02/17 2152)  traZODone (DESYREL) tablet 150 mg (not administered)  sodium chloride 0.9 % bolus 1,000 mL (0 mLs Intravenous Stopped 12/02/17 1952)    And  sodium chloride 0.9 % bolus 1,000 mL (0 mLs Intravenous Stopped 12/02/17 2021)  sodium bicarbonate injection 100 mEq (100 mEq Intravenous Given 12/02/17  2015)  sodium chloride 0.9 % bolus 2,000 mL (2,000 mLs Intravenous New Bag/Given 12/02/17 2157)  sodium chloride 0.9 % bolus 1,000 mL  (1,000 mLs Intravenous New Bag/Given 12/02/17 2157)  PHENYLephrine 40 mcg/ml in normal saline Adult IV Push Syringe (200 mcg Intravenous Given 12/02/17 2045)  sodium bicarbonate injection 50 mEq (50 mEq Intravenous Given 12/02/17 2056)     Initial Impression / Assessment and Plan / ED Course  I have reviewed the triage vital signs and the nursing notes.  Pertinent labs & imaging results that were available during my care of the patient were reviewed by me and considered in my medical decision making (see chart for details).     Patient presents to the ED by EMS with altered mental status and hyperglycemia.  Patient is noncompliant type I diabetic with multiple DKA admissions.  On arrival patient was noted to be hypothermic, hypotensive, tachycardic with a high CBG.  Patient was started on IV fluids, bear hugger.  Systolic blood pressures were in the 70s.  2 large bore IVs were obtained.  Patient has no focal abdominal tenderness.  He is cool to touch.  Lungs clear to auscultation bilaterally with Kuzmal breathing.  Patient is alert and responds to verbal and painful stimuli.  Glucose was 910.  It was 2.9.  Leukocytosis of 20,000.  Hyperkalemia 5.9.  Lactic acid was 4.3.  Lab work significant for pH of 6.8.  Bicarb less than 7.  Patient was given 4 L of IV fluid.  He was started on insulin drip.  Patient was discussed by Dr. Cherylynn Ridges with critical care.  They recommend bicarb drip.   Patient's blood pressure remained low.  Critical care evaluated patient and Neo-Synephrine was given.  Patient was alert and oriented.  No need for intubation at this time.  He was tolerating and managing his airway.  Saturations were 100% on room air.  Critical care evaluated patient and admission orders were placed and patient was transferred to ICU.      Final Clinical Impressions(s) / ED Diagnoses   Final diagnoses:  Diabetic ketoacidosis without coma associated with type 1 diabetes mellitus [facility]     ED Discharge Orders    None       Wallace Keller 12/02/17 2345    Pricilla Loveless, MD 12/03/17 [phone]

## 2017-12-02 NOTE — ED Triage Notes (Signed)
Patient from home, was found to be hyperglycemic, CBG read hi on glucometer.  He is having kussmaul respirations and hypotensive with EMS.  Patient with [facility], able to state that he needs ice water.  Patient started with symptoms yesterday.  Patient has lost 30 pounds in last three weeks.

## 2017-12-02 NOTE — ED Notes (Signed)
I Stat Lactic Acid, Chem 8, VBG results shown to Dr. Criss Alvine

## 2017-12-02 NOTE — H&P (Signed)
PULMONARY / CRITICAL CARE MEDICINE   Name: [name] MRN: [id number] DOB: 1986-10-03    ADMISSION DATE:  12/02/2017 CONSULTATION DATE:  12/02/2017  REFERRING MD:  Dr. Criss Alvine   CHIEF COMPLAINT:  AMS  HISTORY OF PRESENT ILLNESS:   31 year old male with PMH of Type 1 DM, schizophrenia, HTN, Anemia  with multiple DKA admissions secondary to medical non-compliance. Admitted most recently 11/17-11/20 (patient left AMA) and 11/4-11/6.   Presents to ED from home hypotensive with tachypnea. Upon arrival glucose 910, BP 79/51, Crt 2.91, WBC 20.6, K 5.9. LA 4.30. Given 3L NS and started on insulin gtt. PCCM asked to admit.   Spoke to mother [name], reports that she is not sure if patient has been taking psych medications. She reports that when he does not taken medications that "the voices" tell him not to take insulin because it will kill him.    PAST MEDICAL HISTORY :  He  has a past medical history of Anemia, Anxiety, Arthritis, Chicken pox, Childhood asthma, Chronic pain, Depression, DKA (diabetic ketoacidoses) ([name]) (11/18/2014), DKA (diabetic ketoacidoses) ([name]) (07/05/2017), GERD (gastroesophageal reflux disease), Hypertension, Migraine, Noncompliance with medication regimen, Pneumonia, Polysubstance abuse ([name]), Schizo affective schizophrenia ([name]), Scoliosis, and Type I diabetes mellitus ([name]).  PAST SURGICAL HISTORY: He  has a past surgical history that includes Incision and drainage abscess (Left, 11/2011) and Cardiac catheterization (11/2016).  Allergies  Allergen Reactions  . Sulfa Antibiotics Other (See Comments)    Unknown childhood allergy  . Fentanyl Other (See Comments)    Patient had hallucinations with the medication    No current facility-administered medications on file prior to encounter.    Current Outpatient Medications on File Prior to Encounter  Medication Sig  . amphetamine-dextroamphetamine (ADDERALL) 30 MG tablet Take 30 mg daily by mouth.  . doxepin  (SINEQUAN) 10 MG/ML solution Take 10 mLs by mouth at bedtime.  . furosemide (LASIX) 20 MG tablet Take 20 tablets daily by mouth.  . insulin aspart (NOVOLOG FLEXPEN) 100 UNIT/ML FlexPen Inject 0-15 Units into the skin See admin instructions. Check Blood Sugar 4 times per day > with meals and at bedtime CBG 70 - 120: 0 units CBG 121 - 150: 2 units CBG 151 - 200: 3 units CBG 201 - 250: 5 units CBG 251 - 300: 8 units CBG 301 - 350: 11 units CBG 351 - 400: 15 units  . insulin detemir (LEVEMIR) 100 UNIT/ML injection Inject 0.3 mLs (30 Units total) into the skin daily. (Patient not taking: Reported on 11/04/2017)  . metoCLOPramide (REGLAN) 10 MG tablet Take 1 tablet (10 mg total) by mouth 3 (three) times daily as needed for nausea or vomiting.  . pantoprazole (PROTONIX) 40 MG tablet Take 1 tablet (40 mg total) by mouth 2 (two) times daily before a meal.  . sennosides-docusate sodium (SENOKOT-S) 8.6-50 MG tablet Take 1 tablet by mouth as needed for constipation.  . traZODone (DESYREL) 150 MG tablet Take 150 mg by mouth at bedtime.  . [DISCONTINUED] citalopram (CELEXA) 20 MG tablet Take 20 mg by mouth daily.  . [DISCONTINUED] FLUoxetine (PROZAC) 20 MG tablet Take 20 mg by mouth every morning.  . [DISCONTINUED] insulin NPH-regular Human (NOVOLIN 70/30) (70-30) 100 UNIT/ML injection Inject 40 Units into the skin 2 (two) times daily with a meal.  . [DISCONTINUED] OLANZapine zydis (ZYPREXA) 10 MG disintegrating tablet Take 10 mg by mouth 2 (two) times daily.  . [DISCONTINUED] sertraline (ZOLOFT) 50 MG tablet Take 50 mg by mouth daily.  For depression. Just started med, have not picked up yet [facility] [address]    FAMILY HISTORY:  His indicated that his mother is alive.   SOCIAL HISTORY: He  reports that he has been smoking cigarettes.  He has a 4.50 pack-year smoking history. he has never used smokeless tobacco. He reports that he uses drugs. Drug: Marijuana. He reports that he does not drink  alcohol.  REVIEW OF SYSTEMS:   Unable to review as patient is encephalopathy   SUBJECTIVE:   VITAL SIGNS: BP (!) 76/45   Pulse 96   Temp (!) 91.4 F (33 C) (Rectal)   Resp (!) 22   SpO2 96%   HEMODYNAMICS:    VENTILATOR SETTINGS:    INTAKE / OUTPUT: No intake/output data recorded.  PHYSICAL EXAMINATION: General:  Adult male with kussmaul respirations, no distress  Neuro:  Lethargic, thrashing in bed, responds to verbal stimuli, follows commands   HEENT:  Dry MM  Cardiovascular:  Tachy, no MRG Lungs:  Clear breath sounds, tachypnea, no wheeze/crackles  Abdomen:  Non-tender, non-distended, active bowel sounds  Musculoskeletal:  -edema  Skin:  Cool, dry   LABS:  BMET Recent Labs  Lab 12/02/17 1946  NA 128*  K 5.7*  CL 102  BUN 35*  CREATININE 1.70*  GLUCOSE >700*    Electrolytes No results for input(s): CALCIUM, MG, PHOS in the last 168 hours.  CBC Recent Labs  Lab 12/02/17 1928 12/02/17 1946  WBC 20.6*  --   HGB 11.6* 12.6*  HCT 38.4* 37.0*  PLT 566*  --     Coag's No results for input(s): APTT, INR in the last 168 hours.  Sepsis Markers Recent Labs  Lab 12/02/17 1946  LATICACIDVEN 4.30*    ABG No results for input(s): PHART, PCO2ART, PO2ART in the last 168 hours.  Liver Enzymes No results for input(s): AST, ALT, ALKPHOS, BILITOT, ALBUMIN in the last 168 hours.  Cardiac Enzymes No results for input(s): TROPONINI, PROBNP in the last 168 hours.  Glucose Recent Labs  Lab 12/02/17 1938  GLUCAP >600*    Imaging Dg Chest Portable 1 View  Result Date: 12/02/2017 CLINICAL DATA:  Hyperglycemia.  Hypotension. EXAM: PORTABLE CHEST 1 VIEW COMPARISON:  November 03, 2017 FINDINGS: There is no edema or consolidation. The heart size and pulmonary vascularity are normal. No adenopathy. No evident bone lesions. IMPRESSION: No edema or consolidation. Electronically Signed   By: Bretta Bang III M.D.   On: 12/02/2017 19:57     STUDIES:   CXR 12/16 > No acute   CULTURES: Blood 12/16 >> U/A 12/16 >>   ANTIBIOTICS: None.   SIGNIFICANT EVENTS: 12/16 > Presents to ED   LINES/TUBES: PIV   DISCUSSION: 31 year old male noncompliant type 1 DM presents with DKA   ASSESSMENT / PLAN:  PULMONARY A: Tachypnea secondary to metabolic acidosis  P:   Maintain Oxygen Saturation >92  Pulmonary Hygiene  Trend CXR negative for infiltrate  No indication for intubation at this time. GCS>10 and protecting airway and able to compensate for metabolic acidosis.   CARDIOVASCULAR A:  Shock secondary to Hypovolemic shock secondary to severe dehydration and DKA  H/O HTN P:  Cardiac Monitoring  Aggressive IV Fluids as below  Given Neo push and started on gtt in ED Wean Neo to maintain MAP >65  Trend Troponin   RENAL A:   High Anion-Gap Metabolic Acidosis in setting of lactic acidosis and DKA LA 4.30 >  AKI (Base Crt 0.6-0.8)  s/p 3L NS  Crt 2.91 > 1.70   P:   Trend BMP q4h Replace electrolytes as indicated Trend LA   Give 2 additional liters of fluid  Bicarb gtt @ 125 ml/hr until pH > 7  (VBG in ED with pH of 6.8)   GASTROINTESTINAL A:   GERD Nausea  P:   NPO until mentation improves  PPI  PRN Zofran   HEMATOLOGIC A:   Normocytic Anemia  VTE P:  Trend CVC  Heparin SQ SCDS   INFECTIOUS A:   Hypothermic  secondary to severe metabolic acidosis  Temp 91.4 rectally  Reactive Leukocytosis  Chronic Left Diabetic Foot Ulcer  PCT >>  P:   Trend WBC and Fever Curve Follow Culture Data Trend PCT and LA  Monitor off Antibiotics  Bair Hugger  Wound care consult placed   ENDOCRINE A:   DKA H/O Noncompliant Type 1DM    Beta-H >8.00  P:   DKA Protocol  Q1H glucose checks  Q4H BMP   NEUROLOGIC A:   Metabolic Encephalopathy  H/O Schizophrenia, Depression  -Ethanol >>  -UDS >>  P:   Monitor  Continue Trazodone  Patient will need to be seen by psych when better. He may also need to be admitted  involuntarily due to history of schizophrenia and noncompliance with medications. Patient on previous admissions have stated that he does not take his meds because he is told/hears voices that tell him that his medications will kill him. Patient will not be allowed to be discharged/sign out AMA until seen by psych    FAMILY  - Updates: no family at bedside. Mother called and updated.  Tonette Lederer ANP had extensive discussion with mother over the phone which I overheard discussing patients current clinical condition and the severity. Mother is very well aware of her sons condition and non compliance to medication due to his schizophrenia and multiple AMA discharge from the hospital. She will be updated as his condition improves.    - Inter-disciplinary family meet or Palliative Care meeting due by: 12/09/2017  CC Time: 52 minutes  Jovita Kussmaul, AGACNP-BC [facility]  Pgr: [phone]  PCCM Pgr: [phone]  .[facility] Pager: [phone]

## 2017-12-03 DIAGNOSIS — E0811 Diabetes mellitus due to underlying condition with ketoacidosis with coma: Secondary | ICD-10-CM

## 2017-12-03 LAB — URINALYSIS, ROUTINE W REFLEX MICROSCOPIC
BILIRUBIN URINE: NEGATIVE
Glucose, UA: >=500 mg/dL — AB
KETONES UR: 80 mg/dL — AB
Leukocytes, UA: NEGATIVE
Nitrite: NEGATIVE
Protein, ur: NEGATIVE mg/dL
SQUAMOUS EPITHELIAL / LPF: NONE SEEN
Specific Gravity, Urine: 1.012 (ref 1.005–1.030)
pH: 5 (ref 5.0–8.0)

## 2017-12-03 LAB — BASIC METABOLIC PANEL
ANION GAP: 8 (ref 5–15)
Anion gap: 13 (ref 5–15)
Anion gap: 6 (ref 5–15)
Anion gap: 4 — ABNORMAL LOW (ref 5–15)
BUN: 33 mg/dL — AB (ref 6–20)
BUN: 29 mg/dL — AB (ref 6–20)
BUN: 24 mg/dL — ABNORMAL HIGH (ref 6–20)
BUN: 21 mg/dL — AB (ref 6–20)
BUN: 16 mg/dL (ref 6–20)
CALCIUM: 8 mg/dL — AB (ref 8.9–10.3)
CALCIUM: 8.2 mg/dL — AB (ref 8.9–10.3)
CALCIUM: 8.1 mg/dL — AB (ref 8.9–10.3)
CHLORIDE: 116 mmol/L — AB (ref 101–111)
CHLORIDE: 116 mmol/L — AB (ref 101–111)
CO2: <7 mmol/L — ABNORMAL LOW (ref 22–32)
CO2: 12 mmol/L — ABNORMAL LOW (ref 22–32)
CO2: 19 mmol/L — ABNORMAL LOW (ref 22–32)
CO2: 18 mmol/L — AB (ref 22–32)
CO2: 22 mmol/L (ref 22–32)
CREATININE: 2.31 mg/dL — AB (ref 0.61–1.24)
CREATININE: 1.21 mg/dL (ref 0.61–1.24)
CREATININE: 1.01 mg/dL (ref 0.61–1.24)
CREATININE: 0.87 mg/dL (ref 0.61–1.24)
Calcium: 7.7 mg/dL — ABNORMAL LOW (ref 8.9–10.3)
Calcium: 7.9 mg/dL — ABNORMAL LOW (ref 8.9–10.3)
Chloride: 112 mmol/L — ABNORMAL HIGH (ref 101–111)
Chloride: 117 mmol/L — ABNORMAL HIGH (ref 101–111)
Chloride: 115 mmol/L — ABNORMAL HIGH (ref 101–111)
Creatinine, Ser: 1.72 mg/dL — ABNORMAL HIGH (ref 0.61–1.24)
GFR calc Af Amer: 41 mL/min — ABNORMAL LOW (ref >60)
GFR calc Af Amer: 59 mL/min — ABNORMAL LOW (ref >60)
GFR calc Af Amer: >60 mL/min (ref >60)
GFR calc Af Amer: >60 mL/min (ref >60)
GFR calc non Af Amer: >60 mL/min (ref >60)
GFR calc non Af Amer: >60 mL/min (ref >60)
GFR, EST NON AFRICAN AMERICAN: 35 mL/min — AB (ref >60)
GFR, EST NON AFRICAN AMERICAN: 51 mL/min — AB (ref >60)
GLUCOSE: 371 mg/dL — AB (ref 65–99)
GLUCOSE: 214 mg/dL — AB (ref 65–99)
Glucose, Bld: 122 mg/dL — ABNORMAL HIGH (ref 65–99)
Glucose, Bld: 221 mg/dL — ABNORMAL HIGH (ref 65–99)
Glucose, Bld: 118 mg/dL — ABNORMAL HIGH (ref 65–99)
POTASSIUM: 3.5 mmol/L (ref 3.5–5.1)
POTASSIUM: 3.3 mmol/L — AB (ref 3.5–5.1)
POTASSIUM: 2.8 mmol/L — AB (ref 3.5–5.1)
Potassium: 3.4 mmol/L — ABNORMAL LOW (ref 3.5–5.1)
Potassium: 3.5 mmol/L (ref 3.5–5.1)
SODIUM: 138 mmol/L (ref 135–145)
SODIUM: 142 mmol/L (ref 135–145)
SODIUM: 142 mmol/L (ref 135–145)
SODIUM: 142 mmol/L (ref 135–145)
Sodium: 140 mmol/L (ref 135–145)

## 2017-12-03 LAB — GLUCOSE, CAPILLARY
GLUCOSE-CAPILLARY: 104 mg/dL — AB (ref 65–99)
GLUCOSE-CAPILLARY: 107 mg/dL — AB (ref 65–99)
GLUCOSE-CAPILLARY: 140 mg/dL — AB (ref 65–99)
GLUCOSE-CAPILLARY: 141 mg/dL — AB (ref 65–99)
GLUCOSE-CAPILLARY: 169 mg/dL — AB (ref 65–99)
GLUCOSE-CAPILLARY: 171 mg/dL — AB (ref 65–99)
GLUCOSE-CAPILLARY: 194 mg/dL — AB (ref 65–99)
GLUCOSE-CAPILLARY: 198 mg/dL — AB (ref 65–99)
GLUCOSE-CAPILLARY: 396 mg/dL — AB (ref 65–99)
GLUCOSE-CAPILLARY: 446 mg/dL — AB (ref 65–99)
GLUCOSE-CAPILLARY: 92 mg/dL (ref 65–99)
GLUCOSE-CAPILLARY: 95 mg/dL (ref 65–99)
Glucose-Capillary: 110 mg/dL — ABNORMAL HIGH (ref 65–99)
Glucose-Capillary: 111 mg/dL — ABNORMAL HIGH (ref 65–99)
Glucose-Capillary: 131 mg/dL — ABNORMAL HIGH (ref 65–99)
Glucose-Capillary: 132 mg/dL — ABNORMAL HIGH (ref 65–99)
Glucose-Capillary: 133 mg/dL — ABNORMAL HIGH (ref 65–99)
Glucose-Capillary: 202 mg/dL — ABNORMAL HIGH (ref 65–99)
Glucose-Capillary: 212 mg/dL — ABNORMAL HIGH (ref 65–99)
Glucose-Capillary: 243 mg/dL — ABNORMAL HIGH (ref 65–99)
Glucose-Capillary: 282 mg/dL — ABNORMAL HIGH (ref 65–99)
Glucose-Capillary: 329 mg/dL — ABNORMAL HIGH (ref 65–99)

## 2017-12-03 LAB — PHOSPHORUS: Phosphorus: 1.7 mg/dL — ABNORMAL LOW (ref 2.5–4.6)

## 2017-12-03 LAB — CBC
HCT: 29.9 % — ABNORMAL LOW (ref 39.0–52.0)
HEMATOCRIT: 30.1 % — AB (ref 39.0–52.0)
HEMOGLOBIN: 10.6 g/dL — AB (ref 13.0–17.0)
Hemoglobin: 10.1 g/dL — ABNORMAL LOW (ref 13.0–17.0)
MCH: 29.4 pg (ref 26.0–34.0)
MCH: 30.5 pg (ref 26.0–34.0)
MCHC: 33.6 g/dL (ref 30.0–36.0)
MCHC: 35.5 g/dL (ref 30.0–36.0)
MCV: 87.5 fL (ref 78.0–100.0)
MCV: 85.9 fL (ref 78.0–100.0)
PLATELETS: 405 10*3/uL — AB (ref 150–400)
PLATELETS: 334 10*3/uL (ref 150–400)
RBC: 3.44 MIL/uL — AB (ref 4.22–5.81)
RBC: 3.48 MIL/uL — ABNORMAL LOW (ref 4.22–5.81)
RDW: 13.7 % (ref 11.5–15.5)
RDW: 13.9 % (ref 11.5–15.5)
WBC: 22.3 10*3/uL — ABNORMAL HIGH (ref 4.0–10.5)
WBC: 15.8 10*3/uL — ABNORMAL HIGH (ref 4.0–10.5)

## 2017-12-03 LAB — LACTIC ACID, PLASMA: LACTIC ACID, VENOUS: 2.3 mmol/L — AB (ref 0.5–1.9)

## 2017-12-03 LAB — MAGNESIUM
MAGNESIUM: 1.8 mg/dL (ref 1.7–2.4)
Magnesium: 1.8 mg/dL (ref 1.7–2.4)

## 2017-12-03 LAB — POCT I-STAT 3, ART BLOOD GAS (G3+)
Acid-base deficit: <30 mmol/L — ABNORMAL HIGH (ref 0.0–2.0)
Bicarbonate: 2.4 mmol/L — ABNORMAL LOW (ref 20.0–28.0)
O2 Saturation: 69 %
PATIENT TEMPERATURE: 95.6
PH ART: 6.801 — AB (ref 7.350–7.450)
TCO2: <5 mmol/L — ABNORMAL LOW (ref 22–32)
pO2, Arterial: 60 mmHg — ABNORMAL LOW (ref 83.0–108.0)

## 2017-12-03 LAB — PROCALCITONIN: PROCALCITONIN: 1.34 ng/mL

## 2017-12-03 MED ORDER — SODIUM CHLORIDE 0.9 % IV BOLUS (SEPSIS)
1000.0000 mL | Freq: Once | INTRAVENOUS | Status: AC
  Administered 2017-12-03: 1000 mL via INTRAVENOUS

## 2017-12-03 MED ORDER — MAGNESIUM SULFATE 2 GM/50ML IV SOLN
2.0000 g | Freq: Once | INTRAVENOUS | Status: AC
  Administered 2017-12-03: 2 g via INTRAVENOUS
  Filled 2017-12-03: qty 50

## 2017-12-03 MED ORDER — SODIUM BICARBONATE 8.4 % IV SOLN
INTRAVENOUS | Status: DC
  Administered 2017-12-03 – 2017-12-04 (×3): via INTRAVENOUS
  Filled 2017-12-03: qty 50
  Filled 2017-12-03 (×4): qty 150

## 2017-12-03 MED ORDER — POTASSIUM PHOSPHATES 15 MMOLE/5ML IV SOLN
40.0000 meq | Freq: Once | INTRAVENOUS | Status: AC
  Administered 2017-12-03: 40 meq via INTRAVENOUS
  Filled 2017-12-03: qty 9.09

## 2017-12-03 MED ORDER — SODIUM CHLORIDE 0.9 % IV SOLN
INTRAVENOUS | Status: DC
  Administered 2017-12-03 (×2): via INTRAVENOUS

## 2017-12-03 MED ORDER — IBUPROFEN 200 MG PO TABS
400.0000 mg | ORAL_TABLET | Freq: Four times a day (QID) | ORAL | Status: DC | PRN
  Administered 2017-12-03: 400 mg via ORAL
  Filled 2017-12-03: qty 2

## 2017-12-03 MED ORDER — MUPIROCIN CALCIUM 2 % EX CREA
TOPICAL_CREAM | Freq: Every day | CUTANEOUS | Status: DC
  Administered 2017-12-04 – 2017-12-05 (×2): via TOPICAL
  Filled 2017-12-03: qty 15

## 2017-12-03 MED ORDER — POTASSIUM CHLORIDE 10 MEQ/100ML IV SOLN
10.0000 meq | INTRAVENOUS | Status: AC
  Administered 2017-12-03 – 2017-12-04 (×6): 10 meq via INTRAVENOUS
  Filled 2017-12-03: qty 100

## 2017-12-03 NOTE — H&P (Signed)
PULMONARY / CRITICAL CARE MEDICINE   Name: [name] MRN: [id number] DOB: 03-Apr-1986    ADMISSION DATE:  12/02/2017 CONSULTATION DATE:  12/02/2017  REFERRING MD:  Dr. Criss Alvine   CHIEF COMPLAINT:  AMS  HISTORY OF PRESENT ILLNESS:   31 year old male with PMH of Type 1 DM, schizophrenia, HTN, Anemia  with multiple DKA admissions secondary to medical non-compliance. Admitted most recently 11/17-11/20 (patient left AMA) and 11/4-11/6.   Presents to ED from home hypotensive with tachypnea. Upon arrival glucose 910, BP 79/51, Crt 2.91, WBC 20.6, K 5.9. LA 4.30. Given 3L NS and started on insulin gtt. PCCM asked to admit.   Spoke to mother [name], reports that she is not sure if patient has been taking psych medications. She reports that when he does not taken medications that "the voices" tell him not to take insulin because it will kill him.    PAST MEDICAL HISTORY :  He  has a past medical history of Anemia, Anxiety, Arthritis, Chicken pox, Childhood asthma, Chronic pain, Depression, DKA (diabetic ketoacidoses) ([name]) (11/18/2014), DKA (diabetic ketoacidoses) ([name]) (07/05/2017), GERD (gastroesophageal reflux disease), Hypertension, Migraine, Noncompliance with medication regimen, Pneumonia, Polysubstance abuse ([name]), Schizo affective schizophrenia ([name]), Scoliosis, and Type I diabetes mellitus ([name]).  PAST SURGICAL HISTORY: He  has a past surgical history that includes Incision and drainage abscess (Left, 11/2011) and Cardiac catheterization (11/2016).  Allergies  Allergen Reactions  . Sulfa Antibiotics Other (See Comments)    Unknown childhood allergy  . Fentanyl Other (See Comments)    Patient had hallucinations with the medication    No current facility-administered medications on file prior to encounter.    Current Outpatient Medications on File Prior to Encounter  Medication Sig  . amphetamine-dextroamphetamine (ADDERALL) 30 MG tablet Take 30 mg daily by mouth.  . doxepin  (SINEQUAN) 10 MG/ML solution Take 10 mLs by mouth at bedtime.  . furosemide (LASIX) 20 MG tablet Take 20 tablets daily by mouth.  . insulin aspart (NOVOLOG FLEXPEN) 100 UNIT/ML FlexPen Inject 0-15 Units into the skin See admin instructions. Check Blood Sugar 4 times per day > with meals and at bedtime CBG 70 - 120: 0 units CBG 121 - 150: 2 units CBG 151 - 200: 3 units CBG 201 - 250: 5 units CBG 251 - 300: 8 units CBG 301 - 350: 11 units CBG 351 - 400: 15 units  . insulin detemir (LEVEMIR) 100 UNIT/ML injection Inject 0.3 mLs (30 Units total) into the skin daily. (Patient not taking: Reported on 11/04/2017)  . metoCLOPramide (REGLAN) 10 MG tablet Take 1 tablet (10 mg total) by mouth 3 (three) times daily as needed for nausea or vomiting.  . pantoprazole (PROTONIX) 40 MG tablet Take 1 tablet (40 mg total) by mouth 2 (two) times daily before a meal.  . sennosides-docusate sodium (SENOKOT-S) 8.6-50 MG tablet Take 1 tablet by mouth as needed for constipation.  . traZODone (DESYREL) 150 MG tablet Take 150 mg by mouth at bedtime.  . [DISCONTINUED] citalopram (CELEXA) 20 MG tablet Take 20 mg by mouth daily.  . [DISCONTINUED] FLUoxetine (PROZAC) 20 MG tablet Take 20 mg by mouth every morning.  . [DISCONTINUED] insulin NPH-regular Human (NOVOLIN 70/30) (70-30) 100 UNIT/ML injection Inject 40 Units into the skin 2 (two) times daily with a meal.  . [DISCONTINUED] OLANZapine zydis (ZYPREXA) 10 MG disintegrating tablet Take 10 mg by mouth 2 (two) times daily.  . [DISCONTINUED] sertraline (ZOLOFT) 50 MG tablet Take 50 mg by mouth daily.  For depression. Just started med, have not picked up yet [facility] [address]    FAMILY HISTORY:  His indicated that his mother is alive.   SOCIAL HISTORY: He  reports that he has been smoking cigarettes.  He has a 4.50 pack-year smoking history. he has never used smokeless tobacco. He reports that he uses drugs. Drug: Marijuana. He reports that he does not drink  alcohol.  REVIEW OF SYSTEMS:   Unable to review as patient is encephalopathy   SUBJECTIVE:  Stable ovenight. Still on insulin drip.   VITAL SIGNS: BP 97/61   Pulse (!) 121   Temp 98.5 F (36.9 C) (Oral)   Resp 17   SpO2 100%   HEMODYNAMICS:    VENTILATOR SETTINGS:    INTAKE / OUTPUT: I/O last 3 completed shifts: In: 2000 [I.V.:2000] Out: 1500 [Urine:1500]  PHYSICAL EXAMINATION: Gen:      No acute distress, Asleep HEENT:  EOMI, sclera anicteric Neck:     No masses; no thyromegaly Lungs:    Clear to auscultation bilaterally; normal respiratory effort CV:         Regular rate and rhythm; no murmurs Abd:      + bowel sounds; soft, non-tender; no palpable masses, no distension Ext:    No edema; adequate peripheral perfusion Skin:      Warm and dry; no rash Neuro: Somnolent. arousable  LABS:  BMET Recent Labs  Lab 12/02/17 2326 12/03/17 0228 12/03/17 0607  NA 134* 138 142  K 4.2 3.5 3.4*  CL 107 112* 117*  CO2 <7* <7* 12*  BUN 33* 33* 29*  CREATININE 2.49* 2.31* 1.72*  GLUCOSE 561* 371* 214*    Electrolytes Recent Labs  Lab 12/02/17 2014 12/02/17 2326 12/03/17 0228 12/03/17 0607  CALCIUM  --  7.1* 8.0* 8.2*  MG 2.6*  --  1.8 1.8  PHOS 8.5*  --  1.7* <1.0*    CBC Recent Labs  Lab 12/02/17 1928 12/02/17 1946 12/03/17 0228 12/03/17 0607  WBC 20.6*  --  22.3* 15.8*  HGB 11.6* 12.6* 10.1* 10.6*  HCT 38.4* 37.0* 30.1* 29.9*  PLT 566*  --  405* 334    Coag's No results for input(s): APTT, INR in the last 168 hours.  Sepsis Markers Recent Labs  Lab 12/02/17 1946 12/02/17 2014 12/02/17 2326 12/03/17 0228  LATICACIDVEN 4.30*  --  1.5 2.3*  PROCALCITON  --  0.17  --  1.34    ABG No results for input(s): PHART, PCO2ART, PO2ART in the last 168 hours.  Liver Enzymes No results for input(s): AST, ALT, ALKPHOS, BILITOT, ALBUMIN in the last 168 hours.  Cardiac Enzymes Recent Labs  Lab 12/02/17 2028  TROPONINI <0.03    Glucose Recent  Labs  Lab 12/03/17 0447 12/03/17 0551 12/03/17 0647 12/03/17 0735 12/03/17 0850 12/03/17 0958  GLUCAP 243* 194* 169* 133* 131* 111*    Imaging Dg Chest Portable 1 View  Result Date: 12/02/2017 CLINICAL DATA:  Hyperglycemia.  Hypotension. EXAM: PORTABLE CHEST 1 VIEW COMPARISON:  November 03, 2017 FINDINGS: There is no edema or consolidation. The heart size and pulmonary vascularity are normal. No adenopathy. No evident bone lesions. IMPRESSION: No edema or consolidation. Electronically Signed   By: Bretta Bang III M.D.   On: 12/02/2017 19:57   STUDIES:  CXR 12/16 > No acute   CULTURES: Blood 12/16 >> U/A 12/16 >>   ANTIBIOTICS: None.   SIGNIFICANT EVENTS: 12/16 > Presents to ED   LINES/TUBES: PIV  DISCUSSION: 31 year old male noncompliant type 1 DM presents with DKA   ASSESSMENT / PLAN:  PULMONARY A: Tachypnea secondary to metabolic acidosis  P:   Supplemental O2 as needed  CARDIOVASCULAR A:  Shock secondary to Hypovolemic shock secondary to severe dehydration and DKA  H/O HTN P:  Cardiac Monitoring  Continue IVF Off neo Tele monitoring  RENAL A:   High Anion-Gap Metabolic Acidosis in setting of lactic acidosis and DKA LA 4.30 >  AKI (Base Crt 0.6-0.8) s/p 3L NS  Crt 2.91 > 1.70   P:   Follow BMP Replete lytes Off bicarb drip.  GASTROINTESTINAL A:   GERD Nausea  P:   Keep NPO PPI  PRN Zofran   HEMATOLOGIC A:   Normocytic Anemia  VTE P:  Trend CVC  Heparin SQ SCDS   INFECTIOUS A:   Hypothermic  secondary to severe metabolic acidosis  Temp 91.4 rectally  Reactive Leukocytosis  Chronic Left Diabetic Foot Ulcer  PCT >>  P:   Follow WBC count, fevers Monitior off antibiotics Wound care consult  ENDOCRINE A:   DKA H/O Noncompliant Type 1DM    Beta-H >8.00  P:   DKA Protocol  Q1H glucose checks  Q4H BMP   NEUROLOGIC A:   Metabolic Encephalopathy  H/O Schizophrenia, Depression  -Ethanol >>  -UDS >>  P:    Monitor  Continue Trazodone  Call psychiatry to see him He may need to be admitted due to his schizophrenia and non compliance with meds  FAMILY  - Updates: no family at bedside. Mother called and updated 12/17.  Tonette Lederer ANP had extensive discussion with mother over the phone discussing patients current clinical condition and the severity. Mother is very well aware of her sons condition and non compliance to medication due to his schizophrenia and multiple AMA discharge from the hospital. She will be updated as his condition improves.  - Inter-disciplinary family meet or Palliative Care meeting due by: 12/09/2017.  The patient is critically ill with multiple organ system failure and requires high complexity decision making for assessment and support, frequent evaluation and titration of therapies, advanced monitoring, review of radiographic studies and interpretation of complex data.   Critical Care Time devoted to patient care services, exclusive of separately billable procedures, described in this note is 35 minutes.   Chilton Greathouse MD [facility] Pager [phone] If no answer or after 3pm call: [phone] 12/03/2017, 10:22 AM

## 2017-12-03 NOTE — Progress Notes (Signed)
eLink Physician-Brief Progress Note Patient Name: [name] DOB: 23-Jan-1986 MRN: [id number]   Date of Service  12/03/2017  HPI/Events of Note  Multiple issues: 1. K+ = 2.8 and Creatinine = 0.87, 2. Patient c/o neck pain and request for clear liquid diet.   eICU Interventions  Will order: 1. Replace K+.  2. Motrin 400 mg PO Q 6 hours PRN pain. 3. Change to NPO except sips and chips.      Intervention Category Major Interventions: Electrolyte abnormality - evaluation and management  Zaylan Kissoon Eugene 12/03/2017, 7:49 PM

## 2017-12-03 NOTE — Consult Note (Addendum)
WOC Nurse wound consult note Reason for Consult: Consult requested for left foot, pt is familiar to WOC from multiple previous admissions. Wound type: Full thickness wound to left plantar heel. Measurement: .3X.8X.3cm Wound bed: dark red and dry, no fluctuance when probed. Drainage (amount, consistency, odor) small amt tan drainage, no odor Periwound: intact skin surrounding Dressing procedure/placement/frequency: Bactroban to promote moist healing, foam dressing to protect from further injury. Discussed plan of care with patient. Please re-consult if further assistance is needed.  Thank-you,  Cammie Mcgee MSN, RN, CWOCN, [place], CNS [phone]

## 2017-12-03 NOTE — Progress Notes (Addendum)
Inpatient Diabetes Program Recommendations  AACE/ADA: New Consensus Statement on Inpatient Glycemic Control (2015)  Target Ranges:  Prepandial:   less than 140 mg/dL      Peak postprandial:   less than 180 mg/dL (1-2 hours)      Critically ill patients:  140 - 180 mg/dL   Lab Results  Component Value Date   GLUCAP 111 (H) 12/03/2017   HGBA1C 7.8 (H) 10/11/2017    Review of Glycemic Control Results for [name], [name] (MRN [id number]) as of 12/03/2017 11:11  Ref. Range 12/03/2017 06:47 12/03/2017 07:35 12/03/2017 08:50 12/03/2017 09:58  Glucose-Capillary Latest Ref Range: 65 - 99 mg/dL 537 (H) 482 (H) 707 (H) 111 (H)   Diabetes history: Type 1 DM Outpatient Diabetes medications: Novolog 0-15 Units AC, HS, Levemir 30 Units QD Current orders for Inpatient glycemic control: Insulin drip  Inpatient Diabetes Program Recommendations:    Noted patient readmitted for DKA and this is the 14th admission in the last six months. Inpatient diabetes program has seen this patient multiple times. Noted plan for psych consult. When ready to discontinue insulin drip and anion gap clears, may transition to home regimen of Levemir 30 Units QD two hours prior to discontinuation of drip.  Also, of note last A1c was done on 10/11/17, may consider repeating.  Addendum: Attempted to speak to patient today. Patient was sleeping and did not arouse when spoken to. Will attempt again tomorrow.  Thanks,  Lujean Rave, MSN, RNC-OB Diabetes Coordinator [phone] (8a-5p)

## 2017-12-04 ENCOUNTER — Other Ambulatory Visit: Payer: Self-pay

## 2017-12-04 DIAGNOSIS — F17210 Nicotine dependence, cigarettes, uncomplicated: Secondary | ICD-10-CM

## 2017-12-04 DIAGNOSIS — M2550 Pain in unspecified joint: Secondary | ICD-10-CM

## 2017-12-04 DIAGNOSIS — F1210 Cannabis abuse, uncomplicated: Secondary | ICD-10-CM

## 2017-12-04 DIAGNOSIS — Z9114 Patient's other noncompliance with medication regimen: Secondary | ICD-10-CM

## 2017-12-04 DIAGNOSIS — E1310 Other specified diabetes mellitus with ketoacidosis without coma: Secondary | ICD-10-CM

## 2017-12-04 LAB — CBC
HCT: 25.2 % — ABNORMAL LOW (ref 39.0–52.0)
HEMOGLOBIN: 8.9 g/dL — AB (ref 13.0–17.0)
MCH: 29.9 pg (ref 26.0–34.0)
MCHC: 35.3 g/dL (ref 30.0–36.0)
MCV: 84.6 fL (ref 78.0–100.0)
PLATELETS: 239 10*3/uL (ref 150–400)
RBC: 2.98 MIL/uL — AB (ref 4.22–5.81)
RDW: 14.2 % (ref 11.5–15.5)
WBC: 8.7 10*3/uL (ref 4.0–10.5)

## 2017-12-04 LAB — MAGNESIUM
MAGNESIUM: 1.8 mg/dL (ref 1.7–2.4)
MAGNESIUM: 2.2 mg/dL (ref 1.7–2.4)

## 2017-12-04 LAB — PROCALCITONIN: Procalcitonin: 1.23 ng/mL

## 2017-12-04 LAB — GLUCOSE, CAPILLARY
GLUCOSE-CAPILLARY: 317 mg/dL — AB (ref 65–99)
GLUCOSE-CAPILLARY: 257 mg/dL — AB (ref 65–99)
GLUCOSE-CAPILLARY: 134 mg/dL — AB (ref 65–99)
GLUCOSE-CAPILLARY: 194 mg/dL — AB (ref 65–99)
Glucose-Capillary: 257 mg/dL — ABNORMAL HIGH (ref 65–99)

## 2017-12-04 LAB — BASIC METABOLIC PANEL
Anion gap: 10 (ref 5–15)
BUN: 11 mg/dL (ref 6–20)
CO2: 20 mmol/L — ABNORMAL LOW (ref 22–32)
Calcium: 7.9 mg/dL — ABNORMAL LOW (ref 8.9–10.3)
Chloride: 109 mmol/L (ref 101–111)
Creatinine, Ser: 1.06 mg/dL (ref 0.61–1.24)
GFR calc Af Amer: >60 mL/min (ref >60)
Glucose, Bld: 330 mg/dL — ABNORMAL HIGH (ref 65–99)
POTASSIUM: 3.3 mmol/L — AB (ref 3.5–5.1)
SODIUM: 139 mmol/L (ref 135–145)

## 2017-12-04 LAB — PHOSPHORUS
PHOSPHORUS: 1.6 mg/dL — AB (ref 2.5–4.6)
Phosphorus: 2 mg/dL — ABNORMAL LOW (ref 2.5–4.6)

## 2017-12-04 MED ORDER — KETOROLAC TROMETHAMINE 15 MG/ML IJ SOLN
15.0000 mg | Freq: Three times a day (TID) | INTRAMUSCULAR | Status: DC | PRN
  Administered 2017-12-04 (×2): 15 mg via INTRAVENOUS
  Filled 2017-12-04 (×3): qty 1

## 2017-12-04 MED ORDER — PANTOPRAZOLE SODIUM 40 MG PO TBEC
40.0000 mg | DELAYED_RELEASE_TABLET | Freq: Two times a day (BID) | ORAL | Status: DC
  Administered 2017-12-04 – 2017-12-05 (×2): 40 mg via ORAL
  Filled 2017-12-04 (×2): qty 1

## 2017-12-04 MED ORDER — NICOTINE 14 MG/24HR TD PT24
14.0000 mg | MEDICATED_PATCH | Freq: Every day | TRANSDERMAL | Status: DC
  Administered 2017-12-04 – 2017-12-05 (×2): 14 mg via TRANSDERMAL
  Filled 2017-12-04 (×3): qty 1

## 2017-12-04 MED ORDER — INSULIN ASPART 100 UNIT/ML ~~LOC~~ SOLN
0.0000 [IU] | SUBCUTANEOUS | Status: DC
  Administered 2017-12-04: 1 [IU] via SUBCUTANEOUS
  Administered 2017-12-04: 5 [IU] via SUBCUTANEOUS
  Administered 2017-12-04: 3 [IU] via SUBCUTANEOUS
  Administered 2017-12-04: 7 [IU] via SUBCUTANEOUS
  Administered 2017-12-04: 5 [IU] via SUBCUTANEOUS
  Administered 2017-12-04: 2 [IU] via SUBCUTANEOUS
  Administered 2017-12-05: 1 [IU] via SUBCUTANEOUS

## 2017-12-04 MED ORDER — POTASSIUM CHLORIDE 10 MEQ/100ML IV SOLN
10.0000 meq | INTRAVENOUS | Status: AC
  Administered 2017-12-04 (×2): 10 meq via INTRAVENOUS
  Filled 2017-12-04: qty 100

## 2017-12-04 MED ORDER — MAGNESIUM SULFATE 2 GM/50ML IV SOLN
2.0000 g | Freq: Once | INTRAVENOUS | Status: AC
  Administered 2017-12-04: 2 g via INTRAVENOUS
  Filled 2017-12-04: qty 50

## 2017-12-04 MED ORDER — DOXEPIN HCL 10 MG PO CAPS
10.0000 mg | ORAL_CAPSULE | Freq: Every day | ORAL | Status: DC
  Administered 2017-12-04: 10 mg via ORAL
  Filled 2017-12-04: qty 1

## 2017-12-04 MED ORDER — AMPHETAMINE-DEXTROAMPHETAMINE 10 MG PO TABS
30.0000 mg | ORAL_TABLET | Freq: Every day | ORAL | Status: DC
  Administered 2017-12-04 – 2017-12-05 (×2): 30 mg via ORAL
  Filled 2017-12-04 (×2): qty 3

## 2017-12-04 MED ORDER — METOCLOPRAMIDE HCL 10 MG PO TABS
10.0000 mg | ORAL_TABLET | Freq: Three times a day (TID) | ORAL | Status: DC | PRN
  Administered 2017-12-04: 10 mg via ORAL
  Filled 2017-12-04: qty 1

## 2017-12-04 MED ORDER — SODIUM GLYCEROPHOSPHATE 1 MMOLE/ML IV SOLN
20.0000 mmol | Freq: Once | INTRAVENOUS | Status: AC
  Administered 2017-12-04: 20 mmol via INTRAVENOUS
  Filled 2017-12-04: qty 20

## 2017-12-04 MED ORDER — INSULIN DETEMIR 100 UNIT/ML ~~LOC~~ SOLN
15.0000 [IU] | Freq: Two times a day (BID) | SUBCUTANEOUS | Status: DC
  Administered 2017-12-04 – 2017-12-05 (×3): 15 [IU] via SUBCUTANEOUS
  Filled 2017-12-04 (×3): qty 0.15

## 2017-12-04 MED ORDER — INSULIN DETEMIR 100 UNIT/ML ~~LOC~~ SOLN
5.0000 [IU] | Freq: Every day | SUBCUTANEOUS | Status: DC
  Administered 2017-12-04: 5 [IU] via SUBCUTANEOUS
  Filled 2017-12-04: qty 0.05

## 2017-12-04 NOTE — Progress Notes (Signed)
PULMONARY / CRITICAL CARE MEDICINE   Name: [name] MRN: [id number] DOB: Nov 17, 1986    ADMISSION DATE:  12/02/2017 CONSULTATION DATE:  12/02/2017  REFERRING MD:  Dr. Criss Alvine   CHIEF COMPLAINT:  AMS  HISTORY OF PRESENT ILLNESS:   31 year old male with PMH of Type 1 DM, schizophrenia, HTN, Anemia  with multiple DKA admissions secondary to medical non-compliance. Admitted most recently 11/17-11/20 (patient left AMA) and 11/4-11/6.   Presents to ED from home hypotensive with tachypnea. Upon arrival glucose 910, BP 79/51, Crt 2.91, WBC 20.6, K 5.9. LA 4.30. Given 3L NS and started on insulin gtt. PCCM asked to admit.   Spoke to mother [name], reports that she is not sure if patient has been taking psych medications. She reports that when he does not taken medications that "the voices" tell him not to take insulin because it will kill him.    PAST MEDICAL HISTORY :  He  has a past medical history of Anemia, Anxiety, Arthritis, Chicken pox, Childhood asthma, Chronic pain, Depression, DKA (diabetic ketoacidoses) ([name]) (11/18/2014), DKA (diabetic ketoacidoses) ([name]) (07/05/2017), GERD (gastroesophageal reflux disease), Hypertension, Migraine, Noncompliance with medication regimen, Pneumonia, Polysubstance abuse ([name]), Schizo affective schizophrenia ([name]), Scoliosis, and Type I diabetes mellitus ([name]).  PAST SURGICAL HISTORY: He  has a past surgical history that includes Incision and drainage abscess (Left, 11/2011) and Cardiac catheterization (11/2016).  Allergies  Allergen Reactions  . Sulfa Antibiotics Other (See Comments)    Unknown childhood allergy  . Fentanyl Other (See Comments)    Patient had hallucinations with the medication    No current facility-administered medications on file prior to encounter.    Current Outpatient Medications on File Prior to Encounter  Medication Sig  . amphetamine-dextroamphetamine (ADDERALL) 30 MG tablet Take 30 mg daily by mouth.  . doxepin  (SINEQUAN) 10 MG/ML solution Take 10 mLs by mouth at bedtime.  . furosemide (LASIX) 20 MG tablet Take 20 tablets daily by mouth.  . insulin aspart (NOVOLOG FLEXPEN) 100 UNIT/ML FlexPen Inject 0-15 Units into the skin See admin instructions. Check Blood Sugar 4 times per day > with meals and at bedtime CBG 70 - 120: 0 units CBG 121 - 150: 2 units CBG 151 - 200: 3 units CBG 201 - 250: 5 units CBG 251 - 300: 8 units CBG 301 - 350: 11 units CBG 351 - 400: 15 units  . insulin detemir (LEVEMIR) 100 UNIT/ML injection Inject 0.3 mLs (30 Units total) into the skin daily.  . traZODone (DESYREL) 150 MG tablet Take 150 mg by mouth at bedtime.  . metoCLOPramide (REGLAN) 10 MG tablet Take 1 tablet (10 mg total) by mouth 3 (three) times daily as needed for nausea or vomiting. (Patient not taking: Reported on 12/03/2017)  . pantoprazole (PROTONIX) 40 MG tablet Take 1 tablet (40 mg total) by mouth 2 (two) times daily before a meal. (Patient not taking: Reported on 12/03/2017)  . sennosides-docusate sodium (SENOKOT-S) 8.6-50 MG tablet Take 1 tablet by mouth as needed for constipation.  . [DISCONTINUED] citalopram (CELEXA) 20 MG tablet Take 20 mg by mouth daily.  . [DISCONTINUED] FLUoxetine (PROZAC) 20 MG tablet Take 20 mg by mouth every morning.  . [DISCONTINUED] insulin NPH-regular Human (NOVOLIN 70/30) (70-30) 100 UNIT/ML injection Inject 40 Units into the skin 2 (two) times daily with a meal.  . [DISCONTINUED] OLANZapine zydis (ZYPREXA) 10 MG disintegrating tablet Take 10 mg by mouth 2 (two) times daily.  . [DISCONTINUED] sertraline (ZOLOFT) 50 MG tablet  Take 50 mg by mouth daily. For depression. Just started med, have not picked up yet [facility] [address]   FAMILY HISTORY:  His indicated that his mother is alive.  SOCIAL HISTORY: He  reports that he has been smoking cigarettes.  He has a 4.50 pack-year smoking history. he has never used smokeless tobacco. He reports that he uses drugs. Drug: Marijuana.  He reports that he does not drink alcohol.  REVIEW OF SYSTEMS:   CO right shoulder pain All other ROS is negative.   SUBJECTIVE:  Off insulin drip Wanted to leave AMA but stayed after he got something to eat  VITAL SIGNS: BP 100/67   Pulse 77   Temp 98.6 F (37 C) (Oral)   Resp 16   SpO2 99%   HEMODYNAMICS:    VENTILATOR SETTINGS:    INTAKE / OUTPUT: I/O last 3 completed shifts: In: 6599.5 [P.O.:720; I.V.:5279.5; IV Piggyback:600] Out: 4200 [Urine:4200]  PHYSICAL EXAMINATION: Gen:      No acute distress HEENT:  EOMI, sclera anicteric Neck:     No masses; no thyromegaly Lungs:    Clear to auscultation bilaterally; normal respiratory effort CV:         Regular rate and rhythm; no murmurs Abd:      + bowel sounds; soft, non-tender; no palpable masses, no distension Ext:    No edema; adequate peripheral perfusion Skin:      Warm and dry; no rash Neuro: alert and oriented x 3 Psych: normal mood and affect  LABS:  BMET Recent Labs  Lab 12/03/17 1435 12/03/17 1831 12/04/17 0336  NA 140 142 139  K 3.3* 2.8* 3.3*  CL 116* 116* 109  CO2 18* 22 20*  BUN 21* 16 11  CREATININE 1.01 0.87 1.06  GLUCOSE 221* 118* 330*    Electrolytes Recent Labs  Lab 12/03/17 0228 12/03/17 0607  12/03/17 1435 12/03/17 1831 12/04/17 0336  CALCIUM 8.0* 8.2*   < > 7.7* 7.9* 7.9*  MG 1.8 1.8  --   --   --  1.8  PHOS 1.7* <1.0*  --   --   --  1.6*   < > = values in this interval not displayed.    CBC Recent Labs  Lab 12/03/17 0228 12/03/17 0607 12/04/17 0336  WBC 22.3* 15.8* 8.7  HGB 10.1* 10.6* 8.9*  HCT 30.1* 29.9* 25.2*  PLT 405* 334 239    Coag's No results for input(s): APTT, INR in the last 168 hours.  Sepsis Markers Recent Labs  Lab 12/02/17 1946 12/02/17 2014 12/02/17 2326 12/03/17 0228 12/04/17 0336  LATICACIDVEN 4.30*  --  1.5 2.3*  --   PROCALCITON  --  0.17  --  1.34 1.23    ABG Recent Labs  Lab 12/02/17 1946  PHART 6.801*  PCO2ART <15.0*   PO2ART 60.0*    Liver Enzymes No results for input(s): AST, ALT, ALKPHOS, BILITOT, ALBUMIN in the last 168 hours.  Cardiac Enzymes Recent Labs  Lab 12/02/17 2028  TROPONINI <0.03    Glucose Recent Labs  Lab 12/03/17 2031 12/03/17 2128 12/03/17 2227 12/03/17 2353 12/04/17 0342 12/04/17 0819  GLUCAP 92 95 132* 212* 317* 257*    Imaging No results found. STUDIES:  CXR 12/16 > No acute   CULTURES: Blood 12/16 >> U/A 12/16 >>   ANTIBIOTICS: None.   SIGNIFICANT EVENTS: 12/16 > Presents to ED with DKA  LINES/TUBES: PIV   DISCUSSION: 31 year old male noncompliant type 1 DM presents with  DKA   ASSESSMENT / PLAN:  PULMONARY A: Tachypnea secondary to metabolic acidosis  P:   Supplemental O2 as needed  CARDIOVASCULAR A:  Shock secondary to Hypovolemic shock secondary to severe dehydration and DKA > resolved H/O HTN P:   Monitor BP  RENAL A:   High Anion-Gap Metabolic Acidosis in setting of lactic acidosis and DKA LA 4.30 >  AKI (Base Crt 0.6-0.8) s/p 3L NS  Crt 2.91 > 1.70   P:   Follow BMP Replete lytes  GASTROINTESTINAL A:   GERD Nausea  P:   PO diet PPI  PRN Zofran   HEMATOLOGIC A:   Normocytic Anemia  VTE P:  Heparin SQ SCDS   INFECTIOUS A:   Hypothermic  secondary to severe metabolic acidosis  Temp 91.4 rectally  Reactive Leukocytosis  Chronic Left Diabetic Foot Ulcer  PCT >>  P:   Follow WBC count, fevers Monitior off antibiotics Wound care consult  ENDOCRINE A:   DKA H/O Noncompliant Type 1DM    Beta-H >8.00  P:   Off insulin drip Levemir 15 U bid SSI  NEUROLOGIC A:   Metabolic Encephalopathy  H/O Schizophrenia, Depression  Etoh, UDS- negative Pt reports that he had been compliant with meds.  P:   Continue Trazodone. Restart adderal and doxepin Called psychiatry to see him- 12/17  SHOULDER PAIN Chronic pain as per pt. Shoulder X ray on 11/5 is negative Toradol IV PRN for pain. Avoid  narcotics  Stable for transfer from ICU  FAMILY  - Updates: no family at bedside. Mother called and updated 12/17.  Tonette Lederer ANP had extensive discussion with mother over the phone discussing patients current clinical condition and the severity. Mother is very well aware of her sons condition and non compliance to medication due to his schizophrenia and multiple AMA discharge from the hospital.  Mom called again 12/18 to update and voice mail left - Inter-disciplinary family meet or Palliative Care meeting due by: 12/09/2017.  Chilton Greathouse MD [facility] Pager [phone] If no answer or after 3pm call: [phone] 12/04/2017, 9:32 AM

## 2017-12-04 NOTE — Progress Notes (Signed)
Pt given coffee with cream and splenda per Renae Fickle, NP orders. Will continue to monitor pt.

## 2017-12-04 NOTE — Progress Notes (Signed)
[facility] ADULT ICU REPLACEMENT PROTOCOL FOR AM LAB REPLACEMENT ONLY  The patient does apply for the [facility] Adult ICU Electrolyte Replacment Protocol based on the criteria listed below:   1. Is GFR >/= 40 ml/min? Yes.    Patient's GFR today is >60 2. Is urine output >/= 0.5 ml/kg/hr for the last 6 hours? Yes.   Patient's UOP is 4.5  ml/kg/hr 3. Is BUN < 60 mg/dL? Yes.    Patient's BUN today is 11 4. Abnormal electrolyte(s): K 3.3, Mag 1.6 5. Ordered repletion with: protocol 6. If a panic level lab has been reported, has the CCM MD in charge been notified? Yes.  .   Physician:    Barnetta Chapel, Alfonso Ellis A 12/04/2017 6:00 AM

## 2017-12-04 NOTE — Progress Notes (Signed)
Inpatient Diabetes Program Recommendations  AACE/ADA: New Consensus Statement on Inpatient Glycemic Control (2015)  Target Ranges:  Prepandial:   less than 140 mg/dL      Peak postprandial:   less than 180 mg/dL (1-2 hours)      Critically ill patients:  140 - 180 mg/dL   Lab Results  Component Value Date   GLUCAP 257 (H) 12/04/2017   HGBA1C 7.8 (H) 10/11/2017    Review of Glycemic Control Results for DAE, HIGHLEY (MRN [id number]) as of 12/04/2017 11:37  Ref. Range 12/03/2017 21:28 12/03/2017 22:27 12/03/2017 23:53 12/04/2017 03:42 12/04/2017 08:19  Glucose-Capillary Latest Ref Range: 65 - 99 mg/dL 95 409 (H) 811 (H) 914 (H) 257 (H)   Diabetes history: Type 1 DM Outpatient Diabetes medications: Novolog 0-15 Units AC, HS, Levemir 30 Units QD Current orders for Inpatient glycemic control: Levemir 15 units bid + Novolog correction sensitive q 4 hrs.  Inpatient Diabetes Program Recommendations:   -Novolog 3 units tid meal coverage if eats 50%  Thank you, Billy Fischer. Daron Stutz, RN, MSN, CDE  Diabetes Coordinator Inpatient Glycemic Control Team Team Pager [phone] (8am-5pm) 12/04/2017 11:41 AM

## 2017-12-04 NOTE — Progress Notes (Signed)
Pt demanding to leave AMA if he doesn't get to eat.  Pt NPO with sips and chips and diet drinks.  Dr. Arsenio Loader in [facility] notified and Renae Fickle, NP on his way to see pt.  Will continue to monitor pt.

## 2017-12-04 NOTE — Progress Notes (Addendum)
Pt upset that he can't have cream and sugar in his coffee.  Pt using profanity and being aggressive towards staff.  Renae Fickle, NP back to bedside to speak with patient.

## 2017-12-04 NOTE — Consult Note (Addendum)
[facility] Face-to-Face Psychiatry Consult   Reason for Consult:  Medication noncompliance in setting of schizophrenia. Referring Physician:  Dr. Vaughan Browner Patient Identification: BARI HANDSHOE MRN:  [id number] Principal Diagnosis: Undifferentiated schizophrenia [facility]) Diagnosis:   Patient Active Problem List   Diagnosis Date Noted  . DKA (diabetic ketoacidosis) ([place]) [E13.10] 11/03/2017  . Pressure injury of skin [L89.90] 10/11/2017  . Encephalopathy acute [G93.40]   . Hyperbilirubinemia [E80.6] 09/14/2017  . Heel ulcer ([place]) [P10.258] 09/14/2017  . Noncompliance with medication regimen [Z91.14]   . Hypokalemia [E87.6]   . Non healing left heel wound [S91.302A]   . Cellulitis [L03.90]   . DKA, type 1 ([place]) [E10.10] 08/17/2017  . Encounter for imaging study to confirm orogastric (OG) tube placement [Z01.89]   . Acute respiratory acidosis [E87.2]   . DKA (diabetic ketoacidoses) ([place]) [E13.10] 08/04/2017  . Acute respiratory failure with hypoxia ([place]) [J96.01]   . Seizure ([place]) [R56.9]   . Diabetic ketoacidosis ([place]) [E13.10] 07/30/2017  . Normocytic anemia [D64.9] 07/05/2017  . Numbness of right hand [R20.0] 07/05/2017  . Elevated troponin I measurement [R74.8]   . Positive D dimer [R79.89]   . DKA, type 1, not at goal [facility]) [E10.10] 07/01/2017  . Hyperkalemia [E87.5]   . AKI (acute kidney injury) ([place]) [N17.9]   . Medically noncompliant [Z91.19]   . Hyperglycemia [R73.9] 06/16/2017  . Nausea & vomiting [R11.2] 06/15/2017  . Abdominal pain [R10.9] 06/15/2017  . Sepsis ([place]) [A41.9] 06/07/2017  . Chest pain [R07.9] 06/07/2017  . GERD (gastroesophageal reflux disease) [K21.9] 06/07/2017  . Substance abuse ([place]) [F19.10] 05/30/2016  . Substance induced mood disorder ([place]) [F19.94] 05/30/2016  . Tobacco use disorder [F17.200] 12/24/2015  . DM hyperosmolarity type I, uncontrolled ([place]) [E10.69, E10.65] 12/19/2015  . Generalized headache [R51] 07/22/2015  . Bilateral hip bursitis  [M70.71, M70.72] 05/26/2015  . Depression [F32.9]   . Generalized anxiety disorder [F41.1] 03/29/2015  . Undifferentiated schizophrenia ([place]) [F20.3]   . Hip pain, bilateral [M25.551, M25.552] 11/18/2014  . Dehydration [E86.0] 06/17/2012    Total Time spent with patient: 1 hour  Subjective:   [name] is a 31 y.o. male patient admitted with DKA in the setting of medication noncompliance.  HPI:  Per chart review, patient was admitted with DKA. He has had several admissions for DKA in the setting of poor medication compliance. He was last admitted from 11/17-11/20. He left AMA. His mother was contacted during this hospital admission and reports that he hears voices that tell him to not take his medications. He was last admitted to an inpatient psychiatric hospital in 12/2015 for a similar presentation. He was found to be psychotic and transferred for inpatient psychiatric hospitalization. He agreed to take Abilify and was transitioned to Abilify maintena 400 mg. He was prescribed Trazodone with good effect for insomnia. He refused follow up with a psychiatrist. He is currently receiving Adderall 30 mg daily, Doxepin 10 mg qhs and Trazodone 150 mg qhs in the hospital. He saw the psychiatry consult service during his last hospitalization due to concern for SI. He denies SI and did not exhibit psychosis. He was psychiatry cleared.   On interview, Mr. Batson reports feeling upset that "someone" told his treatment team that he was hearing voices telling him to not take his medications. He denies AVH, SI or HI. He does not appear to be responding to internal stimuli. He reports compliance with his medications except for this past week because he slept for most of the day, woke  up to eat and then forgot to take his medications. He denies regularly checking his blood glucose but he reports that he can tell due to physical symptoms when he needs to eat or take his short acting insulin.   Past  Psychiatric History: Schizoaffective disorder   Risk to Self: Is patient at risk for suicide?: No Risk to Others:  None. Denies HI.  Prior Inpatient Therapy:  Multiple hospitalizations for psychosis, anxiety and depression.  Prior Outpatient Therapy:  Prior medications include Abilify, Abilify maintena, Seroquel, Risperdal consta, Xanax and Adderall.   Past Medical History:  Past Medical History:  Diagnosis Date  . Anemia   . Anxiety   . Arthritis    "both hips; both shoulders" (11/05/2017)  . Chicken pox   . Childhood asthma   . Chronic pain   . Depression   . DKA (diabetic ketoacidoses) ([place]) 11/18/2014  . DKA (diabetic ketoacidoses) ([place]) 07/05/2017  . GERD (gastroesophageal reflux disease)   . Hypertension   . Migraine    "a few/year" (06/06/2017)  . Noncompliance with medication regimen   . Pneumonia    "several times" (06/06/2017)  . Polysubstance abuse ([place])   . Schizo affective schizophrenia ([place])   . Scoliosis   . Type I diabetes mellitus ([place])     Past Surgical History:  Procedure Laterality Date  . CARDIAC CATHETERIZATION  11/2016  . INCISION AND DRAINAGE ABSCESS Left 11/2011   "MRSA removed off my thumb"   Family History:  Family History  Problem Relation Age of Onset  . Diabetes Mother    Family Psychiatric  History: Maternal aunt-dementia Social History:  Social History   Substance and Sexual Activity  Alcohol Use No     Social History   Substance and Sexual Activity  Drug Use Yes  . Types: Marijuana   Comment: 11/05/2017 "nonesince 2012"; pt denies hx of cocaine and methamphetamines use on 11/05/2017    Social History   Socioeconomic History  . Marital status: Legally Separated    Spouse name: None  . Number of children: 0  . Years of education: 62  . Highest education level: None  Social Needs  . Financial resource strain: None  . Food insecurity - worry: None  . Food insecurity - inability: None  . Transportation needs - medical:  None  . Transportation needs - non-medical: None  Occupational History  . Occupation: Disability  Tobacco Use  . Smoking status: Current Every Day Smoker    Packs/day: 0.50    Years: 9.00    Pack years: 4.50    Types: Cigarettes  . Smokeless tobacco: Never Used  Substance and Sexual Activity  . Alcohol use: No  . Drug use: Yes    Types: Marijuana    Comment: 11/05/2017 "nonesince 2012"; pt denies hx of cocaine and methamphetamines use on 11/05/2017  . Sexual activity: Not Currently  Other Topics Concern  . None  Social History Narrative   ** Merged History Encounter **    Fun: Video games   Additional Social History: He is from [place] where his mother lives. He graduated from high school and completed some college. He receives disability for mental illness. He lives in an apartment in [place]. He was in jail for 6 months and is currently on probation.      Allergies:   Allergies  Allergen Reactions  . Sulfa Antibiotics Other (See Comments)    Unknown childhood allergy  . Fentanyl Other (See Comments)    Patient had  hallucinations with the medication    Labs:  Results for orders placed or performed during the hospital encounter of 12/02/17 (from the past 48 hour(s))  Basic metabolic panel     Status: Abnormal   Collection Time: 12/02/17  7:28 PM  Result Value Ref Range   Sodium 130 (L) 135 - 145 mmol/L   Potassium 5.9 (H) 3.5 - 5.1 mmol/L   Chloride 93 (L) 101 - 111 mmol/L   CO2 <7 (L) 22 - 32 mmol/L   Glucose, Bld 910 (HH) 65 - 99 mg/dL    Comment: CRITICAL RESULT CALLED TO, READ BACK BY AND VERIFIED WITH: MUNNETT,W RN 12/02/2017 2020 JORDANS    BUN 29 (H) 6 - 20 mg/dL   Creatinine, Ser 2.91 (H) 0.61 - 1.24 mg/dL   Calcium 9.1 8.9 - 10.3 mg/dL   GFR calc non Af Amer 27 (L) >60 mL/min   GFR calc Af Amer 31 (L) >60 mL/min    Comment: (NOTE) The eGFR has been calculated using the CKD EPI equation. This calculation has not been validated in all clinical  situations. eGFR's persistently <60 mL/min signify possible Chronic Kidney Disease.    Anion gap NOT CALCULATED 5 - 15  CBC with Differential (PNL)     Status: Abnormal   Collection Time: 12/02/17  7:28 PM  Result Value Ref Range   WBC 20.6 (H) 4.0 - 10.5 K/uL   RBC 3.91 (L) 4.22 - 5.81 MIL/uL   Hemoglobin 11.6 (L) 13.0 - 17.0 g/dL   HCT 38.4 (L) 39.0 - 52.0 %   MCV 98.2 78.0 - 100.0 fL   MCH 29.7 26.0 - 34.0 pg   MCHC 30.2 30.0 - 36.0 g/dL   RDW 13.9 11.5 - 15.5 %   Platelets 566 (H) 150 - 400 K/uL   Neutrophils Relative % 84 %   Lymphocytes Relative 9 %   Monocytes Relative 7 %   Eosinophils Relative 0 %   Basophils Relative 0 %   Neutro Abs 17.3 (H) 1.7 - 7.7 K/uL   Lymphs Abs 1.9 0.7 - 4.0 K/uL   Monocytes Absolute 1.4 (H) 0.1 - 1.0 K/uL   Eosinophils Absolute 0.0 0.0 - 0.7 K/uL   Basophils Absolute 0.0 0.0 - 0.1 K/uL   RBC Morphology POLYCHROMASIA PRESENT     Comment: BURR CELLS   WBC Morphology MILD LEFT SHIFT (1-5% METAS, OCC MYELO, OCC BANDS)   Beta-hydroxybutyric acid     Status: Abnormal   Collection Time: 12/02/17  7:28 PM  Result Value Ref Range   Beta-Hydroxybutyric Acid >8.00 (H) 0.05 - 0.27 mmol/L    Comment: RESULTS CONFIRMED BY MANUAL DILUTION  CBG monitoring, ED     Status: Abnormal   Collection Time: 12/02/17  7:38 PM  Result Value Ref Range   Glucose-Capillary >600 (HH) 65 - 99 mg/dL  I-Stat Chem 8, ED     Status: Abnormal   Collection Time: 12/02/17  7:46 PM  Result Value Ref Range   Sodium 128 (L) 135 - 145 mmol/L   Potassium 5.7 (H) 3.5 - 5.1 mmol/L   Chloride 102 101 - 111 mmol/L   BUN 35 (H) 6 - 20 mg/dL   Creatinine, Ser 1.70 (H) 0.61 - 1.24 mg/dL   Glucose, Bld >700 (HH) 65 - 99 mg/dL   Calcium, Ion 1.31 1.15 - 1.40 mmol/L   TCO2 5 (L) 22 - 32 mmol/L   Hemoglobin 12.6 (L) 13.0 - 17.0 g/dL   HCT 37.0 (L) 39.0 -  52.0 %   Comment NOTIFIED PHYSICIAN   I-Stat CG4 Lactic Acid, ED     Status: Abnormal   Collection Time: 12/02/17  7:46 PM   Result Value Ref Range   Lactic Acid, Venous 4.30 (HH) 0.5 - 1.9 mmol/L   Comment NOTIFIED PHYSICIAN   I-STAT 3, arterial blood gas (G3+)     Status: Abnormal   Collection Time: 12/02/17  7:46 PM  Result Value Ref Range   pH, Arterial 6.801 (LL) 7.350 - 7.450    Comment: CORRECTED ON 12/17 AT 1151: PREVIOUSLY REPORTED AS 6.783   pCO2 arterial <15.0 (LL) 32.0 - 48.0 mmHg    Comment: CORRECTED ON 12/17 AT 1151: PREVIOUSLY REPORTED AS 16.0   pO2, Arterial 60.0 (L) 83.0 - 108.0 mmHg    Comment: CORRECTED ON 12/17 AT 1151: PREVIOUSLY REPORTED AS 67   Bicarbonate 2.4 (L) 20.0 - 28.0 mmol/L   TCO2 <5.0 (L) 22 - 32 mmol/L   O2 Saturation 69.0 %   Acid-base deficit <30.0 (H) 0.0 - 2.0 mmol/L   Patient temperature 95.6    Sample type VENOUS   Magnesium     Status: Abnormal   Collection Time: 12/02/17  8:14 PM  Result Value Ref Range   Magnesium 2.6 (H) 1.7 - 2.4 mg/dL  Phosphorus     Status: Abnormal   Collection Time: 12/02/17  8:14 PM  Result Value Ref Range   Phosphorus 8.5 (H) 2.5 - 4.6 mg/dL  Procalcitonin - Baseline     Status: None   Collection Time: 12/02/17  8:14 PM  Result Value Ref Range   Procalcitonin 0.17 ng/mL    Comment:        Interpretation: PCT (Procalcitonin) <= 0.5 ng/mL: Systemic infection (sepsis) is not likely. Local bacterial infection is possible. (NOTE)       Sepsis PCT Algorithm           Lower Respiratory Tract                                      Infection PCT Algorithm    ----------------------------     ----------------------------         PCT < 0.25 ng/mL                PCT < 0.10 ng/mL         Strongly encourage             Strongly discourage   discontinuation of antibiotics    initiation of antibiotics    ----------------------------     -----------------------------       PCT 0.25 - 0.50 ng/mL            PCT 0.10 - 0.25 ng/mL               OR       >80% decrease in PCT            Discourage initiation of                                             antibiotics      Encourage discontinuation           of antibiotics    ----------------------------     -----------------------------  PCT >= 0.50 ng/mL              PCT 0.26 - 0.50 ng/mL               AND        <80% decrease in PCT             Encourage initiation of                                             antibiotics       Encourage continuation           of antibiotics    ----------------------------     -----------------------------        PCT >= 0.50 ng/mL                  PCT > 0.50 ng/mL               AND         increase in PCT                  Strongly encourage                                      initiation of antibiotics    Strongly encourage escalation           of antibiotics                                     -----------------------------                                           PCT <= 0.25 ng/mL                                                 OR                                        > 80% decrease in PCT                                     Discontinue / Do not initiate                                             antibiotics   Culture, blood (routine x 2)     Status: None (Preliminary result)   Collection Time: 12/02/17  8:25 PM  Result Value Ref Range   Specimen Description BLOOD LEFT HAND FINGER    Special Requests IN PEDIATRIC BOTTLE Blood Culture adequate volume    Culture NO GROWTH < 24 HOURS    Report Status PENDING   Ethanol  Status: None   Collection Time: 12/02/17  8:28 PM  Result Value Ref Range   Alcohol, Ethyl (B) <10 <10 mg/dL    Comment:        LOWEST DETECTABLE LIMIT FOR SERUM ALCOHOL IS 10 mg/dL FOR MEDICAL PURPOSES ONLY   Troponin I (q 6hr x 3)     Status: None   Collection Time: 12/02/17  8:28 PM  Result Value Ref Range   Troponin I <0.03 <0.03 ng/mL  Culture, blood (routine x 2)     Status: None (Preliminary result)   Collection Time: 12/02/17  8:40 PM  Result Value Ref Range   Specimen Description BLOOD RIGHT  HAND    Special Requests IN PEDIATRIC BOTTLE Blood Culture adequate volume    Culture NO GROWTH < 24 HOURS    Report Status PENDING   Glucose, capillary     Status: Abnormal   Collection Time: 12/02/17  9:38 PM  Result Value Ref Range   Glucose-Capillary >600 (HH) 65 - 99 mg/dL   Comment 1 Document in Chart   MRSA PCR Screening     Status: None   Collection Time: 12/02/17  9:41 PM  Result Value Ref Range   MRSA by PCR NEGATIVE NEGATIVE    Comment:        The GeneXpert MRSA Assay (FDA approved for NASAL specimens only), is one component of a comprehensive MRSA colonization surveillance program. It is not intended to diagnose MRSA infection nor to guide or monitor treatment for MRSA infections.   Glucose, capillary     Status: Abnormal   Collection Time: 12/02/17 10:39 PM  Result Value Ref Range   Glucose-Capillary >600 (HH) 65 - 99 mg/dL   Comment 1 Document in Chart    Comment 2 Glucose Stabilizer   Lactic acid, plasma     Status: None   Collection Time: 12/02/17 11:26 PM  Result Value Ref Range   Lactic Acid, Venous 1.5 0.5 - 1.9 mmol/L  Basic metabolic panel     Status: Abnormal   Collection Time: 12/02/17 11:26 PM  Result Value Ref Range   Sodium 134 (L) 135 - 145 mmol/L   Potassium 4.2 3.5 - 5.1 mmol/L    Comment: DELTA CHECK NOTED   Chloride 107 101 - 111 mmol/L   CO2 <7 (L) 22 - 32 mmol/L   Glucose, Bld 561 (HH) 65 - 99 mg/dL    Comment: CRITICAL RESULT CALLED TO, READ BACK BY AND VERIFIED WITH: WILLIS,S RN 12/03/2017 0002 JORDANS    BUN 33 (H) 6 - 20 mg/dL   Creatinine, Ser 2.49 (H) 0.61 - 1.24 mg/dL   Calcium 7.1 (L) 8.9 - 10.3 mg/dL   GFR calc non Af Amer 32 (L) >60 mL/min   GFR calc Af Amer 37 (L) >60 mL/min    Comment: (NOTE) The eGFR has been calculated using the CKD EPI equation. This calculation has not been validated in all clinical situations. eGFR's persistently <60 mL/min signify possible Chronic Kidney Disease.    Anion gap NOT CALCULATED 5  - 15  Glucose, capillary     Status: Abnormal   Collection Time: 12/02/17 11:42 PM  Result Value Ref Range   Glucose-Capillary 508 (HH) 65 - 99 mg/dL   Comment 1 Glucose Stabilizer   Glucose, capillary     Status: Abnormal   Collection Time: 12/03/17 12:46 AM  Result Value Ref Range   Glucose-Capillary 446 (H) 65 - 99 mg/dL   Comment 1 Document in Chart  Comment 2 Glucose Stabilizer   Glucose, capillary     Status: Abnormal   Collection Time: 12/03/17  1:46 AM  Result Value Ref Range   Glucose-Capillary 396 (H) 65 - 99 mg/dL   Comment 1 Document in Chart    Comment 2 Glucose Stabilizer   Urinalysis, Routine w reflex microscopic     Status: Abnormal   Collection Time: 12/03/17  2:13 AM  Result Value Ref Range   Color, Urine STRAW (A) YELLOW   APPearance CLEAR CLEAR   Specific Gravity, Urine 1.012 1.005 - 1.030   pH 5.0 5.0 - 8.0   Glucose, UA >=500 (A) NEGATIVE mg/dL   Hgb urine dipstick SMALL (A) NEGATIVE   Bilirubin Urine NEGATIVE NEGATIVE   Ketones, ur 80 (A) NEGATIVE mg/dL   Protein, ur NEGATIVE NEGATIVE mg/dL   Nitrite NEGATIVE NEGATIVE   Leukocytes, UA NEGATIVE NEGATIVE   RBC / HPF 0-5 0 - 5 RBC/hpf   WBC, UA 0-5 0 - 5 WBC/hpf   Bacteria, UA RARE (A) NONE SEEN   Squamous Epithelial / LPF NONE SEEN NONE SEEN   Mucus PRESENT    Hyaline Casts, UA PRESENT   CBC     Status: Abnormal   Collection Time: 12/03/17  2:28 AM  Result Value Ref Range   WBC 22.3 (H) 4.0 - 10.5 K/uL   RBC 3.44 (L) 4.22 - 5.81 MIL/uL   Hemoglobin 10.1 (L) 13.0 - 17.0 g/dL   HCT 30.1 (L) 39.0 - 52.0 %   MCV 87.5 78.0 - 100.0 fL    Comment: REPEATED TO VERIFY DELTA CHECK NOTED RESULT CALLED TO, READ BACK BY AND VERIFIED WITH: Colleen Can [id number] 12/03/2017 T. TYSOR    MCH 29.4 26.0 - 34.0 pg   MCHC 33.6 30.0 - 36.0 g/dL   RDW 13.7 11.5 - 15.5 %   Platelets 405 (H) 150 - 400 K/uL  Magnesium     Status: None   Collection Time: 12/03/17  2:28 AM  Result Value Ref Range   Magnesium 1.8 1.7 -  2.4 mg/dL  Phosphorus     Status: Abnormal   Collection Time: 12/03/17  2:28 AM  Result Value Ref Range   Phosphorus 1.7 (L) 2.5 - 4.6 mg/dL  Procalcitonin     Status: None   Collection Time: 12/03/17  2:28 AM  Result Value Ref Range   Procalcitonin 1.34 ng/mL    Comment:        Interpretation: PCT > 0.5 ng/mL and <= 2 ng/mL: Systemic infection (sepsis) is possible, but other conditions are known to elevate PCT as well. (NOTE)       Sepsis PCT Algorithm           Lower Respiratory Tract                                      Infection PCT Algorithm    ----------------------------     ----------------------------         PCT < 0.25 ng/mL                PCT < 0.10 ng/mL         Strongly encourage             Strongly discourage   discontinuation of antibiotics    initiation of antibiotics    ----------------------------     -----------------------------       PCT  0.25 - 0.50 ng/mL            PCT 0.10 - 0.25 ng/mL               OR       >80% decrease in PCT            Discourage initiation of                                            antibiotics      Encourage discontinuation           of antibiotics    ----------------------------     -----------------------------         PCT >= 0.50 ng/mL              PCT 0.26 - 0.50 ng/mL                AND       <80% decrease in PCT             Encourage initiation of                                             antibiotics       Encourage continuation           of antibiotics    ----------------------------     -----------------------------        PCT >= 0.50 ng/mL                  PCT > 0.50 ng/mL               AND         increase in PCT                  Strongly encourage                                      initiation of antibiotics    Strongly encourage escalation           of antibiotics                                     -----------------------------                                           PCT <= 0.25 ng/mL                                                  OR                                        > 80% decrease in PCT  Discontinue / Do not initiate                                             antibiotics   Lactic acid, plasma     Status: Abnormal   Collection Time: 12/03/17  2:28 AM  Result Value Ref Range   Lactic Acid, Venous 2.3 (HH) 0.5 - 1.9 mmol/L    Comment: CRITICAL RESULT CALLED TO, READ BACK BY AND VERIFIED WITH: WILLIS,S RN 12/03/2017 0315 JORDANS   Basic metabolic panel     Status: Abnormal   Collection Time: 12/03/17  2:28 AM  Result Value Ref Range   Sodium 138 135 - 145 mmol/L   Potassium 3.5 3.5 - 5.1 mmol/L   Chloride 112 (H) 101 - 111 mmol/L   CO2 <7 (L) 22 - 32 mmol/L   Glucose, Bld 371 (H) 65 - 99 mg/dL   BUN 33 (H) 6 - 20 mg/dL   Creatinine, Ser 2.31 (H) 0.61 - 1.24 mg/dL   Calcium 8.0 (L) 8.9 - 10.3 mg/dL   GFR calc non Af Amer 35 (L) >60 mL/min   GFR calc Af Amer 41 (L) >60 mL/min    Comment: (NOTE) The eGFR has been calculated using the CKD EPI equation. This calculation has not been validated in all clinical situations. eGFR's persistently <60 mL/min signify possible Chronic Kidney Disease.    Anion gap NOT CALCULATED 5 - 15  Glucose, capillary     Status: Abnormal   Collection Time: 12/03/17  2:50 AM  Result Value Ref Range   Glucose-Capillary 329 (H) 65 - 99 mg/dL  Glucose, capillary     Status: Abnormal   Collection Time: 12/03/17  3:45 AM  Result Value Ref Range   Glucose-Capillary 282 (H) 65 - 99 mg/dL   Comment 1 Document in Chart    Comment 2 Glucose Stabilizer   Glucose, capillary     Status: Abnormal   Collection Time: 12/03/17  4:47 AM  Result Value Ref Range   Glucose-Capillary 243 (H) 65 - 99 mg/dL  Glucose, capillary     Status: Abnormal   Collection Time: 12/03/17  5:51 AM  Result Value Ref Range   Glucose-Capillary 194 (H) 65 - 99 mg/dL   Comment 1 Document in Chart    Comment 2 Glucose Stabilizer   Basic metabolic  panel     Status: Abnormal   Collection Time: 12/03/17  6:07 AM  Result Value Ref Range   Sodium 142 135 - 145 mmol/L   Potassium 3.4 (L) 3.5 - 5.1 mmol/L   Chloride 117 (H) 101 - 111 mmol/L   CO2 12 (L) 22 - 32 mmol/L   Glucose, Bld 214 (H) 65 - 99 mg/dL   BUN 29 (H) 6 - 20 mg/dL   Creatinine, Ser 1.72 (H) 0.61 - 1.24 mg/dL   Calcium 8.2 (L) 8.9 - 10.3 mg/dL   GFR calc non Af Amer 51 (L) >60 mL/min   GFR calc Af Amer 59 (L) >60 mL/min    Comment: (NOTE) The eGFR has been calculated using the CKD EPI equation. This calculation has not been validated in all clinical situations. eGFR's persistently <60 mL/min signify possible Chronic Kidney Disease.    Anion gap 13 5 - 15  CBC     Status: Abnormal   Collection Time: 12/03/17  6:07 AM  Result Value Ref  Range   WBC 15.8 (H) 4.0 - 10.5 K/uL   RBC 3.48 (L) 4.22 - 5.81 MIL/uL   Hemoglobin 10.6 (L) 13.0 - 17.0 g/dL   HCT 29.9 (L) 39.0 - 52.0 %   MCV 85.9 78.0 - 100.0 fL   MCH 30.5 26.0 - 34.0 pg   MCHC 35.5 30.0 - 36.0 g/dL   RDW 13.9 11.5 - 15.5 %   Platelets 334 150 - 400 K/uL  Magnesium     Status: None   Collection Time: 12/03/17  6:07 AM  Result Value Ref Range   Magnesium 1.8 1.7 - 2.4 mg/dL  Phosphorus     Status: Abnormal   Collection Time: 12/03/17  6:07 AM  Result Value Ref Range   Phosphorus <1.0 (LL) 2.5 - 4.6 mg/dL    Comment: CRITICAL RESULT CALLED TO, READ BACK BY AND VERIFIED WITH: P.DICKINSON,RN [id number] 12/03/17 CLARK,S   Glucose, capillary     Status: Abnormal   Collection Time: 12/03/17  6:47 AM  Result Value Ref Range   Glucose-Capillary 169 (H) 65 - 99 mg/dL   Comment 1 Document in Chart    Comment 2 Glucose Stabilizer   Glucose, capillary     Status: Abnormal   Collection Time: 12/03/17  7:35 AM  Result Value Ref Range   Glucose-Capillary 133 (H) 65 - 99 mg/dL   Comment 1 Capillary Specimen    Comment 2 Notify RN   Glucose, capillary     Status: Abnormal   Collection Time: 12/03/17  8:50 AM  Result  Value Ref Range   Glucose-Capillary 131 (H) 65 - 99 mg/dL  Glucose, capillary     Status: Abnormal   Collection Time: 12/03/17  9:58 AM  Result Value Ref Range   Glucose-Capillary 111 (H) 65 - 99 mg/dL   Comment 1 Venous Specimen   Glucose, capillary     Status: Abnormal   Collection Time: 12/03/17 11:00 AM  Result Value Ref Range   Glucose-Capillary 104 (H) 65 - 99 mg/dL  Basic metabolic panel     Status: Abnormal   Collection Time: 12/03/17 11:10 AM  Result Value Ref Range   Sodium 142 135 - 145 mmol/L   Potassium 3.5 3.5 - 5.1 mmol/L   Chloride 115 (H) 101 - 111 mmol/L   CO2 19 (L) 22 - 32 mmol/L   Glucose, Bld 122 (H) 65 - 99 mg/dL   BUN 24 (H) 6 - 20 mg/dL   Creatinine, Ser 1.21 0.61 - 1.24 mg/dL   Calcium 8.1 (L) 8.9 - 10.3 mg/dL   GFR calc non Af Amer >60 >60 mL/min   GFR calc Af Amer >60 >60 mL/min    Comment: (NOTE) The eGFR has been calculated using the CKD EPI equation. This calculation has not been validated in all clinical situations. eGFR's persistently <60 mL/min signify possible Chronic Kidney Disease.    Anion gap 8 5 - 15  Glucose, capillary     Status: Abnormal   Collection Time: 12/03/17 12:06 PM  Result Value Ref Range   Glucose-Capillary 110 (H) 65 - 99 mg/dL  Glucose, capillary     Status: Abnormal   Collection Time: 12/03/17  1:38 PM  Result Value Ref Range   Glucose-Capillary 140 (H) 65 - 99 mg/dL  Basic metabolic panel     Status: Abnormal   Collection Time: 12/03/17  2:35 PM  Result Value Ref Range   Sodium 140 135 - 145 mmol/L   Potassium 3.3 (L) 3.5 -  5.1 mmol/L   Chloride 116 (H) 101 - 111 mmol/L   CO2 18 (L) 22 - 32 mmol/L   Glucose, Bld 221 (H) 65 - 99 mg/dL   BUN 21 (H) 6 - 20 mg/dL   Creatinine, Ser 1.01 0.61 - 1.24 mg/dL   Calcium 7.7 (L) 8.9 - 10.3 mg/dL   GFR calc non Af Amer >60 >60 mL/min   GFR calc Af Amer >60 >60 mL/min    Comment: (NOTE) The eGFR has been calculated using the CKD EPI equation. This calculation has not  been validated in all clinical situations. eGFR's persistently <60 mL/min signify possible Chronic Kidney Disease.    Anion gap 6 5 - 15  Glucose, capillary     Status: Abnormal   Collection Time: 12/03/17  2:48 PM  Result Value Ref Range   Glucose-Capillary 202 (H) 65 - 99 mg/dL  Glucose, capillary     Status: Abnormal   Collection Time: 12/03/17  3:58 PM  Result Value Ref Range   Glucose-Capillary 198 (H) 65 - 99 mg/dL   Comment 1 Capillary Specimen    Comment 2 Notify RN   Glucose, capillary     Status: Abnormal   Collection Time: 12/03/17  5:09 PM  Result Value Ref Range   Glucose-Capillary 171 (H) 65 - 99 mg/dL  Glucose, capillary     Status: Abnormal   Collection Time: 12/03/17  5:59 PM  Result Value Ref Range   Glucose-Capillary 141 (H) 65 - 99 mg/dL   Comment 1 Capillary Specimen    Comment 2 Notify RN   Basic metabolic panel     Status: Abnormal   Collection Time: 12/03/17  6:31 PM  Result Value Ref Range   Sodium 142 135 - 145 mmol/L   Potassium 2.8 (L) 3.5 - 5.1 mmol/L   Chloride 116 (H) 101 - 111 mmol/L   CO2 22 22 - 32 mmol/L   Glucose, Bld 118 (H) 65 - 99 mg/dL   BUN 16 6 - 20 mg/dL   Creatinine, Ser 0.87 0.61 - 1.24 mg/dL   Calcium 7.9 (L) 8.9 - 10.3 mg/dL   GFR calc non Af Amer >60 >60 mL/min   GFR calc Af Amer >60 >60 mL/min    Comment: (NOTE) The eGFR has been calculated using the CKD EPI equation. This calculation has not been validated in all clinical situations. eGFR's persistently <60 mL/min signify possible Chronic Kidney Disease.    Anion gap 4 (L) 5 - 15  Glucose, capillary     Status: Abnormal   Collection Time: 12/03/17  7:25 PM  Result Value Ref Range   Glucose-Capillary 107 (H) 65 - 99 mg/dL  Glucose, capillary     Status: None   Collection Time: 12/03/17  8:31 PM  Result Value Ref Range   Glucose-Capillary 92 65 - 99 mg/dL   Comment 1 Notify RN   Glucose, capillary     Status: None   Collection Time: 12/03/17  9:28 PM  Result Value  Ref Range   Glucose-Capillary 95 65 - 99 mg/dL  Glucose, capillary     Status: Abnormal   Collection Time: 12/03/17 10:27 PM  Result Value Ref Range   Glucose-Capillary 132 (H) 65 - 99 mg/dL   Comment 1 Notify RN   Glucose, capillary     Status: Abnormal   Collection Time: 12/03/17 11:53 PM  Result Value Ref Range   Glucose-Capillary 212 (H) 65 - 99 mg/dL   Comment 1 Venous Specimen  Procalcitonin     Status: None   Collection Time: 12/04/17  3:36 AM  Result Value Ref Range   Procalcitonin 1.23 ng/mL    Comment:        Interpretation: PCT > 0.5 ng/mL and <= 2 ng/mL: Systemic infection (sepsis) is possible, but other conditions are known to elevate PCT as well. (NOTE)       Sepsis PCT Algorithm           Lower Respiratory Tract                                      Infection PCT Algorithm    ----------------------------     ----------------------------         PCT < 0.25 ng/mL                PCT < 0.10 ng/mL         Strongly encourage             Strongly discourage   discontinuation of antibiotics    initiation of antibiotics    ----------------------------     -----------------------------       PCT 0.25 - 0.50 ng/mL            PCT 0.10 - 0.25 ng/mL               OR       >80% decrease in PCT            Discourage initiation of                                            antibiotics      Encourage discontinuation           of antibiotics    ----------------------------     -----------------------------         PCT >= 0.50 ng/mL              PCT 0.26 - 0.50 ng/mL                AND       <80% decrease in PCT             Encourage initiation of                                             antibiotics       Encourage continuation           of antibiotics    ----------------------------     -----------------------------        PCT >= 0.50 ng/mL                  PCT > 0.50 ng/mL               AND         increase in PCT                  Strongly encourage  initiation of antibiotics    Strongly encourage escalation           of antibiotics                                     -----------------------------                                           PCT <= 0.25 ng/mL                                                 OR                                        > 80% decrease in PCT                                     Discontinue / Do not initiate                                             antibiotics   CBC     Status: Abnormal   Collection Time: 12/04/17  3:36 AM  Result Value Ref Range   WBC 8.7 4.0 - 10.5 K/uL   RBC 2.98 (L) 4.22 - 5.81 MIL/uL   Hemoglobin 8.9 (L) 13.0 - 17.0 g/dL   HCT 25.2 (L) 39.0 - 52.0 %   MCV 84.6 78.0 - 100.0 fL   MCH 29.9 26.0 - 34.0 pg   MCHC 35.3 30.0 - 36.0 g/dL   RDW 14.2 11.5 - 15.5 %   Platelets 239 150 - 400 K/uL  Basic metabolic panel     Status: Abnormal   Collection Time: 12/04/17  3:36 AM  Result Value Ref Range   Sodium 139 135 - 145 mmol/L   Potassium 3.3 (L) 3.5 - 5.1 mmol/L   Chloride 109 101 - 111 mmol/L   CO2 20 (L) 22 - 32 mmol/L   Glucose, Bld 330 (H) 65 - 99 mg/dL   BUN 11 6 - 20 mg/dL   Creatinine, Ser 1.06 0.61 - 1.24 mg/dL   Calcium 7.9 (L) 8.9 - 10.3 mg/dL   GFR calc non Af Amer >60 >60 mL/min   GFR calc Af Amer >60 >60 mL/min    Comment: (NOTE) The eGFR has been calculated using the CKD EPI equation. This calculation has not been validated in all clinical situations. eGFR's persistently <60 mL/min signify possible Chronic Kidney Disease.    Anion gap 10 5 - 15  Magnesium     Status: None   Collection Time: 12/04/17  3:36 AM  Result Value Ref Range   Magnesium 1.8 1.7 - 2.4 mg/dL  Phosphorus     Status: Abnormal   Collection Time: 12/04/17  3:36 AM  Result Value Ref Range   Phosphorus 1.6 (L) 2.5 - 4.6 mg/dL  Glucose, capillary  Status: Abnormal   Collection Time: 12/04/17  3:42 AM  Result Value Ref Range   Glucose-Capillary 317 (H) 65 - 99 mg/dL    Comment 1 Venous Specimen   Glucose, capillary     Status: Abnormal   Collection Time: 12/04/17  8:19 AM  Result Value Ref Range   Glucose-Capillary 257 (H) 65 - 99 mg/dL   Comment 1 Capillary Specimen    Comment 2 Notify RN   Glucose, capillary     Status: Abnormal   Collection Time: 12/04/17 11:56 AM  Result Value Ref Range   Glucose-Capillary 257 (H) 65 - 99 mg/dL   Comment 1 Capillary Specimen    Comment 2 Notify RN     Current Facility-Administered Medications  Medication Dose Route Frequency Provider Last Rate Last Dose  . 0.9 %  sodium chloride infusion   Intravenous Continuous Omar Person, NP   Stopped at 12/03/17 1104  . amphetamine-dextroamphetamine (ADDERALL) tablet 30 mg  30 mg Oral Q breakfast Mannam, Praveen, MD   30 mg at 12/04/17 0955  . doxepin (SINEQUAN) capsule 10 mg  10 mg Oral QHS Mannam, Praveen, MD      . heparin injection 5,000 Units  5,000 Units Subcutaneous Q8H Omar Person, NP   5,000 Units at 12/04/17 0505  . insulin aspart (novoLOG) injection 0-9 Units  0-9 Units Subcutaneous Q4H Anders Simmonds, MD   5 Units at 12/04/17 1157  . insulin detemir (LEVEMIR) injection 15 Units  15 Units Subcutaneous BID Mannam, Praveen, MD   15 Units at 12/04/17 1014  . ketorolac (TORADOL) 15 MG/ML injection 15 mg  15 mg Intravenous Q8H PRN Mannam, Praveen, MD   15 mg at 12/04/17 1004  . metoCLOPramide (REGLAN) tablet 10 mg  10 mg Oral Q8H PRN Mannam, Praveen, MD   10 mg at 12/04/17 1101  . mupirocin cream (BACTROBAN) 2 %   Topical Daily Mannam, Praveen, MD      . nicotine (NICODERM CQ - dosed in mg/24 hours) patch 14 mg  14 mg Transdermal Daily Leaf, [employer], RPH      . ondansetron (ZOFRAN) injection 4 mg  4 mg Intravenous Q6H PRN Omar Person, NP   4 mg at 12/02/17 2152  . pantoprazole (PROTONIX) EC tablet 40 mg  40 mg Oral BID Mannam, Praveen, MD      . sodium glycerophosphate (GLYCOPHOS) 20 mmol in sodium chloride 0.9 % 250 mL infusion  20 mmol  Intravenous Once Mannam, Praveen, MD 34 mL/hr at 12/04/17 1005 20 mmol at 12/04/17 1005  . traZODone (DESYREL) tablet 150 mg  150 mg Oral QHS Omar Person, NP   150 mg at 12/03/17 2136    Musculoskeletal: Strength & Muscle Tone: within normal limits Gait & Station: normal Patient leans: N/A  Psychiatric Specialty Exam: Physical Exam  Nursing note and vitals reviewed. Constitutional: He is oriented to person, place, and time. He appears well-developed and well-nourished.  HENT:  Head: Normocephalic and atraumatic.  Neck: Normal range of motion.  Respiratory: Effort normal.  Musculoskeletal: Normal range of motion.  Neurological: He is alert and oriented to person, place, and time.  Skin: No rash noted.  Psychiatric: He has a normal mood and affect. His speech is normal and behavior is normal. Judgment and thought content normal. Cognition and memory are normal.    Review of Systems  Cardiovascular: Positive for chest pain.  Musculoskeletal: Positive for joint pain (Left shoulder pain).  Psychiatric/Behavioral: Negative for hallucinations  and suicidal ideas.  All other systems reviewed and are negative.   Blood pressure 115/77, pulse 77, temperature 98.8 F (37.1 C), temperature source Oral, resp. rate (!) 26, SpO2 99 %.There is no height or weight on file to calculate BMI.  General Appearance: Disheveled, young, Caucasian male with unbrushed hair and poor dentition who is wearing a hospital gown and sitting up in bed and holding a bus tub. NAD.   Eye Contact:  Absent  Speech:  Clear and Coherent and Normal Rate  Volume:  Normal  Mood:  "Okay"  Affect:  Appropriate and Full Range  Thought Process:  Goal Directed and Linear  Orientation:  Full (Time, Place, and Person)  Thought Content:  Logical  Suicidal Thoughts:  No  Homicidal Thoughts:  No  Memory:  Immediate;   Good Recent;   Good Remote;   Good  Judgement:  Fair  Insight:  Fair  Psychomotor Activity:  Normal   Concentration:  Concentration: Good and Attention Span: Good  Recall:  Good  Fund of Knowledge:  Good  Language:  Good  Akathisia:  No  Handed:  Right  AIMS (if indicated):   N/A  Assets:  Communication Skills Desire for Improvement Housing Social Support  ADL's:  Intact  Cognition:  WNL  Sleep:   N/A   Assessment:  [name] is a 31 y.o. male who was admitted with DKA. He denies active psychosis or specifically hearing voices that tell him to not take his medications. He reports recent compliance with his medications except for missing a day since he slept for most of the day. He does not warrant inpatient psychiatric hospitalization.   Treatment Plan Summary: -Patient is psychiatrically cleared. Psychiatry will sign off on patient at this time. Please consult psychiatry again as needed.   Disposition: No evidence of imminent risk to self or others at present.   Patient does not meet criteria for psychiatric inpatient admission.  Faythe Dingwall, DO 12/04/2017 12:27 PM

## 2017-12-04 NOTE — Progress Notes (Signed)
PCCM INTERVAL PROGRESS NOTE  Called to bedside to evaluate patient wanting to leave AMA. He is upset that he cannot have food and coffee. He wants to eat what he wants, and not what is available. We discussed that options are limited at night and he could have coffee with sugar free sweetener. I told him that this is a hospital, and it wouldn't be a very good one if we gave him things that would make his condition worse. We compromised with coffee and a pack of crackers if he will agree to take his insulin.   He will stay in the hospital for now.    Joneen Roach, AGACNP-BC [facility] Pulmonology/Critical Care Pager [phone] or [phone]  12/04/2017 4:39 AM

## 2017-12-04 NOTE — Progress Notes (Signed)
eLink Physician-Brief Progress Note Patient Name: [name] DOB: 03-13-1986 MRN: [id number]   Date of Service  12/04/2017  HPI/Events of Note  Hyperglycemia - Blood glucose = 212. Now off Novolog insulin IV infusion.   eICU Interventions  Will order: 1. D/C Novolog Insulin IV infusion. 2. Levemir 5 units [place] Q HS. 3. Q 4 hour sensitive Novolog SSI     Intervention Category Major Interventions: Hyperglycemia - active titration of insulin therapy  Sommer,Steven Eugene 12/04/2017, 12:11 AM

## 2017-12-05 DIAGNOSIS — E872 Acidosis: Secondary | ICD-10-CM

## 2017-12-05 DIAGNOSIS — D649 Anemia, unspecified: Secondary | ICD-10-CM

## 2017-12-05 DIAGNOSIS — M25519 Pain in unspecified shoulder: Secondary | ICD-10-CM

## 2017-12-05 DIAGNOSIS — G8929 Other chronic pain: Secondary | ICD-10-CM

## 2017-12-05 DIAGNOSIS — R0682 Tachypnea, not elsewhere classified: Secondary | ICD-10-CM

## 2017-12-05 DIAGNOSIS — F203 Undifferentiated schizophrenia: Secondary | ICD-10-CM

## 2017-12-05 DIAGNOSIS — N179 Acute kidney failure, unspecified: Secondary | ICD-10-CM

## 2017-12-05 LAB — GLUCOSE, CAPILLARY
GLUCOSE-CAPILLARY: 36 mg/dL — AB (ref 65–99)
GLUCOSE-CAPILLARY: 63 mg/dL — AB (ref 65–99)
GLUCOSE-CAPILLARY: 244 mg/dL — AB (ref 65–99)
Glucose-Capillary: 121 mg/dL — ABNORMAL HIGH (ref 65–99)
Glucose-Capillary: 147 mg/dL — ABNORMAL HIGH (ref 65–99)
Glucose-Capillary: 77 mg/dL (ref 65–99)

## 2017-12-05 LAB — BASIC METABOLIC PANEL
Anion gap: 8 (ref 5–15)
BUN: 6 mg/dL (ref 6–20)
CO2: 26 mmol/L (ref 22–32)
CREATININE: 0.8 mg/dL (ref 0.61–1.24)
Calcium: 7.9 mg/dL — ABNORMAL LOW (ref 8.9–10.3)
Chloride: 106 mmol/L (ref 101–111)
GFR calc Af Amer: >60 mL/min (ref >60)
Glucose, Bld: 130 mg/dL — ABNORMAL HIGH (ref 65–99)
Potassium: 3.2 mmol/L — ABNORMAL LOW (ref 3.5–5.1)
SODIUM: 140 mmol/L (ref 135–145)

## 2017-12-05 LAB — CBC
HCT: 26.2 % — ABNORMAL LOW (ref 39.0–52.0)
Hemoglobin: 8.7 g/dL — ABNORMAL LOW (ref 13.0–17.0)
MCH: 28.8 pg (ref 26.0–34.0)
MCHC: 33.2 g/dL (ref 30.0–36.0)
MCV: 86.8 fL (ref 78.0–100.0)
Platelets: 215 10*3/uL (ref 150–400)
RBC: 3.02 MIL/uL — ABNORMAL LOW (ref 4.22–5.81)
RDW: 14.2 % (ref 11.5–15.5)
WBC: 4.3 10*3/uL (ref 4.0–10.5)

## 2017-12-05 LAB — MAGNESIUM: MAGNESIUM: 1.9 mg/dL (ref 1.7–2.4)

## 2017-12-05 LAB — PHOSPHORUS: Phosphorus: 3 mg/dL (ref 2.5–4.6)

## 2017-12-05 MED ORDER — POTASSIUM CHLORIDE CRYS ER 20 MEQ PO TBCR
40.0000 meq | EXTENDED_RELEASE_TABLET | Freq: Once | ORAL | Status: DC
  Filled 2017-12-05: qty 2

## 2017-12-05 MED ORDER — POTASSIUM CHLORIDE 20 MEQ PO PACK
40.0000 meq | PACK | Freq: Once | ORAL | Status: AC
  Administered 2017-12-05: 40 meq via ORAL
  Filled 2017-12-05: qty 2

## 2017-12-05 MED ORDER — FUROSEMIDE 20 MG PO TABS: 20.0000 mg | ORAL_TABLET | Freq: Every day | ORAL | 0 refills | Status: DC

## 2017-12-05 MED ORDER — NICOTINE 14 MG/24HR TD PT24: 14.0000 mg | MEDICATED_PATCH | Freq: Every day | TRANSDERMAL | 0 refills | Status: DC

## 2017-12-05 NOTE — Progress Notes (Signed)
CSW alerted by MD and RNCM that patient needs cab voucher home. CSW provided cab voucher for patient to return home.  Blenda Nicely, [place] Clinical Social Worker [phone]

## 2017-12-05 NOTE — Progress Notes (Signed)
Inpatient Diabetes Program Recommendations  AACE/ADA: New Consensus Statement on Inpatient Glycemic Control (2015)  Target Ranges:  Prepandial:   less than 140 mg/dL      Peak postprandial:   less than 180 mg/dL (1-2 hours)      Critically ill patients:  140 - 180 mg/dL   Lab Results  Component Value Date   GLUCAP 244 (H) 12/05/2017   HGBA1C 7.8 (H) 10/11/2017    Review of Glycemic Control Results for DAYSHAWN, IRIZARRY (MRN [id number]) as of 12/05/2017 12:29  Ref. Range 12/05/2017 03:48 12/05/2017 04:32 12/05/2017 08:21 12/05/2017 11:44  Glucose-Capillary Latest Ref Range: 65 - 99 mg/dL 77  270 (H) 350 (H)   Diabetes history: Type 1 DM Outpatient Diabetes medications:Novolog 0-15 Units AC, HS, Levemir 30 Units QD Current orders for Inpatient glycemic control: Levemir 15 units bid + Novolog correction sensitive q 4 hrs.  Noted of potential discharge given psychiatric consult. Transitioned to diet carb modified.   Inpatient Diabetes Program Recommendations:   -Novolog 3 units tid meal coverage if eats 50%. -Could transition off the Q4H correction to TID with meals.   Thanks, Lujean Rave, MSN, RNC-OB Diabetes Coordinator [phone] (8a-5p)

## 2017-12-05 NOTE — Progress Notes (Signed)
F/S- 34 at 0300. Patient asked for glucerna. Glucerna given. F/S- 63 at 0325. Another nurse gave patient 6 ounces of Coke.

## 2017-12-05 NOTE — Care Management Note (Signed)
Case Management Note  Patient Details  Name: [name] MRN: [id number] Date of Birth: 1986/01/06  Subjective/Objective:                    Action/Plan: Pt discharging home with self care. Pt without transportation home. CSW updated and cab voucher provided. Pt has PCP, insurance. No further needs per CM.  Expected Discharge Date:  12/05/17               Expected Discharge Plan:  Home/Self Care  In-House Referral:     Discharge planning Services     Post Acute Care Choice:    Choice offered to:     DME Arranged:    DME Agency:     HH Arranged:    HH Agency:     Status of Service:  Completed, signed off  If discussed at [employer] of Stay Meetings, dates discussed:    Additional Comments:  Kermit Balo, RN 12/05/2017, 1:36 PM

## 2017-12-05 NOTE — Discharge Summary (Signed)
Physician Discharge Summary  [name] KGY:[id number] DOB: 07/25/1986 DOA: 12/02/2017  PCP: Fleet Contras, MD  Admit date: 12/02/2017 Discharge date: 12/05/2017  Time spent: 45 minutes  Recommendations for Outpatient Follow-up:  Patient will be discharged to home.  Patient will need to follow up with primary care provider within one week of discharge.  Patient should continue medications as prescribed.  Patient should follow a carb modified diet.   Discharge Diagnoses:  Tachypnea secondary to metabolic acidosis Shock/Hypothermia/leukocytosis   DKA Essential hypertension Acute kidney injury GERD Nausea Normocytic anemia Metabolic encephalopathy History of schizophrenia and depresion Alcohol abuse Shoulder pain  Discharge Condition: Stable  Diet recommendation: Carb modified  Filed Weights   12/04/17 1254  Weight: 70.3 kg (155 lb)    History of present illness:  On 12/02/2017 by Dr. Lionel December (ICU/PCCM) 31 year old male with PMH of Type 1 DM, schizophrenia, HTN, Anemia  with multiple DKA admissions secondary to medical non-compliance. Admitted most recently 11/17-11/20 (patient left AMA) and 11/4-11/6.  Presents to ED from home hypotensive with tachypnea. Upon arrival glucose 910, BP 79/51, Crt 2.91, WBC 20.6, K 5.9. LA 4.30. Given 3L NS and started on insulin gtt. PCCM asked to admit.  Spoke to mother [name], reports that she is not sure if patient has been taking psych medications. She reports that when he does not taken medications that "the voices" tell him not to take insulin because it will kill him.    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 Will follow up with his PCP and discuss.   Essential hypertension -continue lasix  Acute kidney injury -Resolved, likely due to DKA vs lasix  GERD -Continue PPI  Nausea -likely secondary to DKA -resolved  Normocytic anemia -stable, baseline appears to be between 8-10  Metabolic encephalopathy -Resolved, likely secondary to DKA  History of schizophrenia and depresion -psychiatry consulted and appreciated- and has signed off.  -continue home medications   Alcohol abuse -UDS negative -dicussed cessation   Shoulder pain -Chronic -Xray unremarkable -continue OTC meds for pain control  Procedures: None  Consultations: Psychiatry  PCCM  Discharge Exam: Vitals:   12/05/17 0518 12/05/17 1036  BP: (!) 136/93   Pulse: 94 97  Resp: 20 20  Temp: 98.9 F (37.2 C) 98.9 F (37.2 C)  SpO2: 98% 99%     General: Well developed, well nourished, NAD, appears stated age  HEENT: NCAT, mucous membranes moist. Poor dentition  Cardiovascular: S1 S2 auscultated, no rubs, murmurs or gallops. Regular rate and rhythm.  Respiratory: Clear to auscultation bilaterally with equal chest rise  Abdomen: Soft, nontender, nondistended, + bowel sounds  Extremities: warm dry without cyanosis clubbing or edema  Neuro: AAOx3, nonfocal  Psych: Normal affect and demeanor with intact judgement and insight  Discharge Instructions Discharge Instructions    Discharge instructions   Complete by:  As directed    Patient will be discharged to home.  Patient will need to  follow up with primary care provider within one week of discharge.  Patient should continue medications as  prescribed.  Patient should follow a carb modified diet.     Allergies as of 12/05/2017      Reactions   Sulfa Antibiotics Other (See Comments)   Unknown childhood allergy   Fentanyl Other (See Comments)   Patient had hallucinations with the medication      Medication List    TAKE these medications   amphetamine-dextroamphetamine 30 MG tablet Commonly known as:  ADDERALL Take 30 mg daily by mouth.   doxepin 10 MG/ML solution Commonly known as:  SINEQUAN Take 10 mLs by mouth at bedtime.   furosemide 20 MG tablet Commonly known as:  LASIX Take 1 tablet (20 mg total) by mouth daily. What changed:  how much to take   insulin aspart 100 UNIT/ML FlexPen Commonly known as:  NOVOLOG FLEXPEN Inject 0-15 Units into the skin See admin instructions. Check Blood Sugar 4 times per day > with meals and at bedtime CBG 70 - 120: 0 units CBG 121 - 150: 2 units CBG 151 - 200: 3 units CBG 201 - 250: 5 units CBG 251 - 300: 8 units CBG 301 - 350: 11 units CBG 351 - 400: 15 units   insulin detemir 100 UNIT/ML injection Commonly known as:  LEVEMIR Inject 0.3 mLs (30 Units total) into the skin daily.   metoCLOPramide 10 MG tablet Commonly known as:  REGLAN Take 1 tablet (10 mg total) by mouth 3 (three) times daily as needed for nausea or vomiting.   nicotine 14 mg/24hr patch Commonly known as:  NICODERM CQ - dosed in mg/24 hours Place 1 patch (14 mg total) onto the skin daily. Start taking on:  12/06/2017   pantoprazole 40 MG tablet Commonly known as:  PROTONIX Take 1 tablet (40 mg total) by mouth 2 (two) times daily before a meal.   sennosides-docusate sodium 8.6-50 MG tablet Commonly known as:  SENOKOT-S Take 1 tablet by mouth as needed for constipation.   traZODone 150 MG tablet Commonly known as:  DESYREL Take 150 mg by mouth at bedtime.      Allergies  Allergen Reactions  . Sulfa Antibiotics Other (See Comments)    Unknown childhood allergy  . Fentanyl Other (See  Comments)    Patient had hallucinations with the medication   Follow-up Information    Fleet Contras, MD. Schedule an appointment as soon as possible for a visit in 1 week(s).   Specialty:  Internal Medicine Why:  Hospital follow up Contact information: [address] [place] [address] [phone]            The results of significant diagnostics from this hospitalization (including imaging, microbiology, ancillary and laboratory) are listed below for reference.    Significant Diagnostic Studies: Dg Chest Portable 1 View  Result Date: 12/02/2017 CLINICAL DATA:  Hyperglycemia.  Hypotension. EXAM: PORTABLE CHEST 1 VIEW COMPARISON:  November 03, 2017 FINDINGS: There is no edema or consolidation. The heart size and pulmonary vascularity are normal. No adenopathy. No evident bone lesions. IMPRESSION: No edema or consolidation. Electronically Signed   By: Bretta Bang III M.D.   On: 12/02/2017 19:57    Microbiology: Recent Results (from the past 240 hour(s))  Culture, blood (routine x 2)     Status: None (Preliminary result)   Collection Time: 12/02/17  8:25 PM  Result Value Ref Range Status   Specimen Description BLOOD LEFT HAND FINGER  Final  Special Requests IN PEDIATRIC BOTTLE Blood Culture adequate volume  Final   Culture NO GROWTH 2 DAYS  Final   Report Status PENDING  Incomplete  Culture, blood (routine x 2)     Status: None (Preliminary result)   Collection Time: 12/02/17  8:40 PM  Result Value Ref Range Status   Specimen Description BLOOD RIGHT HAND  Final   Special Requests IN PEDIATRIC BOTTLE Blood Culture adequate volume  Final   Culture NO GROWTH 2 DAYS  Final   Report Status PENDING  Incomplete  MRSA PCR Screening     Status: None   Collection Time: 12/02/17  9:41 PM  Result Value Ref Range Status   MRSA by PCR NEGATIVE NEGATIVE Final    Comment:        The GeneXpert MRSA Assay (FDA approved for NASAL specimens only), is one component of  a comprehensive MRSA colonization surveillance program. It is not intended to diagnose MRSA infection nor to guide or monitor treatment for MRSA infections.      Labs: Basic Metabolic Panel: Recent Labs  Lab 12/03/17 0228 12/03/17 0607 12/03/17 1110 12/03/17 1435 12/03/17 1831 12/04/17 0336 12/04/17 1451 12/05/17 0432  NA 138 142 142 140 142 139  --  140  K 3.5 3.4* 3.5 3.3* 2.8* 3.3*  --  3.2*  CL 112* 117* 115* 116* 116* 109  --  106  CO2 <7* 12* 19* 18* 22 20*  --  26  GLUCOSE 371* 214* 122* 221* 118* 330*  --  130*  BUN 33* 29* 24* 21* 16 11  --  6  CREATININE 2.31* 1.72* 1.21 1.01 0.87 1.06  --  0.80  CALCIUM 8.0* 8.2* 8.1* 7.7* 7.9* 7.9*  --  7.9*  MG 1.8 1.8  --   --   --  1.8 2.2 1.9  PHOS 1.7* <1.0*  --   --   --  1.6* 2.0* 3.0   Liver Function Tests: No results for input(s): AST, ALT, ALKPHOS, BILITOT, PROT, ALBUMIN in the last 168 hours. No results for input(s): LIPASE, AMYLASE in the last 168 hours. No results for input(s): AMMONIA in the last 168 hours. CBC: Recent Labs  Lab 12/02/17 1928 12/02/17 1946 12/03/17 0228 12/03/17 0607 12/04/17 0336 12/05/17 0432  WBC 20.6*  --  22.3* 15.8* 8.7 4.3  NEUTROABS 17.3*  --   --   --   --   --   HGB 11.6* 12.6* 10.1* 10.6* 8.9* 8.7*  HCT 38.4* 37.0* 30.1* 29.9* 25.2* 26.2*  MCV 98.2  --  87.5 85.9 84.6 86.8  PLT 566*  --  405* 334 239 215   Cardiac Enzymes: Recent Labs  Lab 12/02/17 2028  TROPONINI <0.03   BNP: BNP (last 3 results) No results for input(s): BNP in the last 8760 hours.  ProBNP (last 3 results) No results for input(s): PROBNP in the last 8760 hours.  CBG: Recent Labs  Lab 12/05/17 0304 12/05/17 0326 12/05/17 0348 12/05/17 0821 12/05/17 1144  GLUCAP 36* 63* 77 147* 244*       Signed:  Wanetta Funderburke  [facility] 12/05/2017, 12:38 PM

## 2017-12-05 NOTE — Progress Notes (Signed)
F/S- 77. Patient requested Coke. 4 ounces given.

## 2017-12-05 NOTE — Discharge Instructions (Signed)
Diabetic Ketoacidosis °Diabetic ketoacidosis is a life-threatening complication of diabetes. If it is not treated, it can cause severe dehydration and organ damage and can lead to a coma or death. °What are the causes? °This condition develops when there is not enough of the hormone insulin in the body. Insulin helps the body to break down sugar for energy. Without insulin, the body cannot break down sugar, so it breaks down fats instead. This leads to the production of acids that are called ketones. Ketones are poisonous at high levels. °This condition can be triggered by: °· Stress on the body that is brought on by an illness. °· Medicines that raise blood glucose levels. °· Not taking diabetes medicine. ° °What are the signs or symptoms? °Symptoms of this condition include: °· Fatigue. °· Weight loss. °· Excessive thirst. °· Light-headedness. °· Fruity or sweet-smelling breath. °· Excessive urination. °· Vision changes. °· Confusion or irritability. °· Nausea. °· Vomiting. °· Rapid breathing. °· Abdominal pain. °· Feeling flushed. ° °How is this diagnosed? °This condition is diagnosed based on a medical history, a physical exam, and blood tests. You may also have a urine test that checks for ketones. °How is this treated? °This condition may be treated with: °· Fluid replacement. This may be done to correct dehydration. °· Insulin injections. These may be given through the skin or through an IV tube. °· Electrolyte replacement. Electrolytes, such as potassium and sodium, may be given in pill form or through an IV tube. °· Antibiotic medicines. These may be prescribed if your condition was caused by an infection. ° °Follow these instructions at home: °Eating and drinking °· Drink enough fluids to keep your urine clear or pale yellow. °· If you cannot eat, alternate between drinking fluids with sugar (such as juice) and salty fluids (such as broth or bouillon). °· If you can eat, follow your usual diet and drink  sugar-free liquids, such as water. °Other Instructions ° °· Take insulin as directed by your health care provider. Do not skip insulin injections. Do not use expired insulin. °· If your blood sugar is over 240 mg/dL, monitor your urine ketones every 4-6 hours. °· If you were prescribed an antibiotic medicine, finish all of it even if you start to feel better. °· Rest and exercise only as directed by your health care provider. °· If you get sick, call your health care provider and begin treatment quickly. Your body often needs extra insulin to fight an illness. °· Check your blood glucose levels regularly. If your blood glucose is high, drink plenty of fluids. This helps to flush out ketones. °Contact a health care provider if: °· Your blood glucose level is too high or too low. °· You have ketones in your urine. °· You have a fever. °· You cannot eat. °· You cannot tolerate fluids. °· You have been vomiting for more than 2 hours. °· You continue to have symptoms of this condition. °· You develop new symptoms. °Get help right away if: °· Your blood glucose levels continue to be high (elevated). °· Your monitor reads “high” even when you are taking insulin. °· You faint. °· You have chest pain. °· You have trouble breathing. °· You have a sudden, severe headache. °· You have sudden weakness in one arm or one leg. °· You have sudden trouble speaking or swallowing. °· You have vomiting or diarrhea that gets worse after 3 hours. °· You feel severely fatigued. °· You have trouble thinking. °· You   have abdominal pain. °· You are severely dehydrated. Symptoms of severe dehydration include: °? Extreme thirst. °? Dry mouth. °? Blue lips. °? Cold hands and feet. °? Rapid breathing. °This information is not intended to replace advice given to you by your health care provider. Make sure you discuss any questions you have with your health care provider. °Document Released: 12/01/2000 Document Revised: 05/11/2016 Document  Reviewed: 11/11/2014 °Elsevier Interactive Patient Education © 2017 [employer] ° °

## 2017-12-05 NOTE — Progress Notes (Signed)
Patient continues to get up often tonight without ringing call bell despite being constantly reminded. Patient has nervous energy. Bed alarm continues to be set and working. Patient unsteady on his feet. Being watched closely by staff.

## 2017-12-05 NOTE — Progress Notes (Signed)
MD not returning page to this point.

## 2017-12-07 LAB — CULTURE, BLOOD (ROUTINE X 2)
CULTURE: NO GROWTH
Culture: NO GROWTH
SPECIAL REQUESTS: ADEQUATE
SPECIAL REQUESTS: ADEQUATE

## 2017-12-09 ENCOUNTER — Inpatient Hospital Stay (HOSPITAL_COMMUNITY)
Admission: EM | Admit: 2017-12-09 | Discharge: 2017-12-12 | DRG: 638 | Disposition: A | Discharge status: Home / Self Care | Payer: Medicare Other | Attending: Internal Medicine | Admitting: Internal Medicine

## 2017-12-09 DIAGNOSIS — F419 Anxiety disorder, unspecified: Secondary | ICD-10-CM | POA: Diagnosis present

## 2017-12-09 DIAGNOSIS — E1010 Type 1 diabetes mellitus with ketoacidosis without coma: Principal | ICD-10-CM

## 2017-12-09 DIAGNOSIS — F1910 Other psychoactive substance abuse, uncomplicated: Secondary | ICD-10-CM

## 2017-12-09 DIAGNOSIS — H539 Unspecified visual disturbance: Secondary | ICD-10-CM | POA: Diagnosis not present

## 2017-12-09 DIAGNOSIS — K219 Gastro-esophageal reflux disease without esophagitis: Secondary | ICD-10-CM | POA: Diagnosis present

## 2017-12-09 DIAGNOSIS — E876 Hypokalemia: Secondary | ICD-10-CM | POA: Diagnosis not present

## 2017-12-09 DIAGNOSIS — Z9119 Patient's noncompliance with other medical treatment and regimen: Secondary | ICD-10-CM

## 2017-12-09 DIAGNOSIS — E1069 Type 1 diabetes mellitus with other specified complication: Secondary | ICD-10-CM

## 2017-12-09 DIAGNOSIS — G8929 Other chronic pain: Secondary | ICD-10-CM | POA: Diagnosis present

## 2017-12-09 DIAGNOSIS — F259 Schizoaffective disorder, unspecified: Secondary | ICD-10-CM | POA: Diagnosis present

## 2017-12-09 DIAGNOSIS — E103293 Type 1 diabetes mellitus with mild nonproliferative diabetic retinopathy without macular edema, bilateral: Secondary | ICD-10-CM | POA: Diagnosis present

## 2017-12-09 DIAGNOSIS — D649 Anemia, unspecified: Secondary | ICD-10-CM | POA: Diagnosis present

## 2017-12-09 DIAGNOSIS — I10 Essential (primary) hypertension: Secondary | ICD-10-CM | POA: Diagnosis present

## 2017-12-09 DIAGNOSIS — M25552 Pain in left hip: Secondary | ICD-10-CM

## 2017-12-09 DIAGNOSIS — F329 Major depressive disorder, single episode, unspecified: Secondary | ICD-10-CM | POA: Diagnosis present

## 2017-12-09 DIAGNOSIS — E875 Hyperkalemia: Secondary | ICD-10-CM | POA: Diagnosis present

## 2017-12-09 DIAGNOSIS — R7989 Other specified abnormal findings of blood chemistry: Secondary | ICD-10-CM | POA: Diagnosis present

## 2017-12-09 DIAGNOSIS — Z79899 Other long term (current) drug therapy: Secondary | ICD-10-CM

## 2017-12-09 DIAGNOSIS — D72829 Elevated white blood cell count, unspecified: Secondary | ICD-10-CM | POA: Diagnosis present

## 2017-12-09 DIAGNOSIS — H547 Unspecified visual loss: Secondary | ICD-10-CM

## 2017-12-09 DIAGNOSIS — E870 Hyperosmolality and hypernatremia: Secondary | ICD-10-CM

## 2017-12-09 DIAGNOSIS — R079 Chest pain, unspecified: Secondary | ICD-10-CM

## 2017-12-09 DIAGNOSIS — F17210 Nicotine dependence, cigarettes, uncomplicated: Secondary | ICD-10-CM | POA: Diagnosis present

## 2017-12-09 DIAGNOSIS — H35033 Hypertensive retinopathy, bilateral: Secondary | ICD-10-CM | POA: Diagnosis present

## 2017-12-09 DIAGNOSIS — N179 Acute kidney failure, unspecified: Secondary | ICD-10-CM

## 2017-12-09 DIAGNOSIS — E1110 Type 2 diabetes mellitus with ketoacidosis without coma: Secondary | ICD-10-CM | POA: Diagnosis present

## 2017-12-09 DIAGNOSIS — R0789 Other chest pain: Secondary | ICD-10-CM | POA: Diagnosis present

## 2017-12-09 DIAGNOSIS — M25551 Pain in right hip: Secondary | ICD-10-CM | POA: Diagnosis present

## 2017-12-09 DIAGNOSIS — E861 Hypovolemia: Secondary | ICD-10-CM | POA: Diagnosis present

## 2017-12-09 DIAGNOSIS — E1065 Type 1 diabetes mellitus with hyperglycemia: Secondary | ICD-10-CM

## 2017-12-09 LAB — CBC WITH DIFFERENTIAL/PLATELET
BASOS PCT: 0 %
Basophils Absolute: 0 10*3/uL (ref 0.0–0.1)
EOS PCT: 0 %
Eosinophils Absolute: 0 10*3/uL (ref 0.0–0.7)
HCT: 36.2 % — ABNORMAL LOW (ref 39.0–52.0)
HEMOGLOBIN: 11.9 g/dL — AB (ref 13.0–17.0)
LYMPHS PCT: 10 %
Lymphs Abs: 2.2 10*3/uL (ref 0.7–4.0)
MCH: 30.3 pg (ref 26.0–34.0)
MCHC: 32.9 g/dL (ref 30.0–36.0)
MCV: 92.1 fL (ref 78.0–100.0)
MONOS PCT: 7 %
Monocytes Absolute: 1.5 10*3/uL — ABNORMAL HIGH (ref 0.1–1.0)
NEUTROS PCT: 83 %
Neutro Abs: 18.2 10*3/uL — ABNORMAL HIGH (ref 1.7–7.7)
PLATELETS: 570 10*3/uL — AB (ref 150–400)
RBC: 3.93 MIL/uL — AB (ref 4.22–5.81)
RDW: 13.7 % (ref 11.5–15.5)
WBC: 21.9 10*3/uL — AB (ref 4.0–10.5)

## 2017-12-09 LAB — COMPREHENSIVE METABOLIC PANEL
ALK PHOS: 114 U/L (ref 38–126)
ALT: 18 U/L (ref 17–63)
AST: 17 U/L (ref 15–41)
Albumin: 3.9 g/dL (ref 3.5–5.0)
BUN: 22 mg/dL — ABNORMAL HIGH (ref 6–20)
CALCIUM: 8.5 mg/dL — AB (ref 8.9–10.3)
CREATININE: 1.6 mg/dL — AB (ref 0.61–1.24)
Chloride: 104 mmol/L (ref 101–111)
GFR, EST NON AFRICAN AMERICAN: 55 mL/min — AB (ref >60)
GLUCOSE: 510 mg/dL — AB (ref 65–99)
Potassium: 6.1 mmol/L — ABNORMAL HIGH (ref 3.5–5.1)
Sodium: 130 mmol/L — ABNORMAL LOW (ref 135–145)
TOTAL PROTEIN: 6.4 g/dL — AB (ref 6.5–8.1)
Total Bilirubin: 2.4 mg/dL — ABNORMAL HIGH (ref 0.3–1.2)

## 2017-12-09 LAB — CBG MONITORING, ED: Glucose-Capillary: 445 mg/dL — ABNORMAL HIGH (ref 65–99)

## 2017-12-09 MED ORDER — SODIUM BICARBONATE 8.4 % IV SOLN
50.0000 meq | Freq: Once | INTRAVENOUS | Status: AC
  Administered 2017-12-10: 50 meq via INTRAVENOUS
  Filled 2017-12-09: qty 50

## 2017-12-09 MED ORDER — ONDANSETRON HCL 4 MG/2ML IJ SOLN
4.0000 mg | Freq: Once | INTRAMUSCULAR | Status: AC
  Administered 2017-12-09: 4 mg via INTRAVENOUS
  Filled 2017-12-09: qty 2

## 2017-12-09 MED ORDER — SODIUM CHLORIDE 0.9 % IV SOLN
INTRAVENOUS | Status: DC
  Filled 2017-12-09: qty 1

## 2017-12-09 MED ORDER — HYDROMORPHONE HCL 1 MG/ML IJ SOLN
0.5000 mg | Freq: Once | INTRAMUSCULAR | Status: AC
  Administered 2017-12-09: 0.5 mg via INTRAVENOUS
  Filled 2017-12-09: qty 1

## 2017-12-09 MED ORDER — SODIUM CHLORIDE 0.9 % IV BOLUS (SEPSIS)
1000.0000 mL | Freq: Once | INTRAVENOUS | Status: AC
  Administered 2017-12-09: 1000 mL via INTRAVENOUS

## 2017-12-09 MED ORDER — LACTATED RINGERS IV BOLUS (SEPSIS)
2000.0000 mL | Freq: Once | INTRAVENOUS | Status: AC
  Administered 2017-12-09: 2000 mL via INTRAVENOUS

## 2017-12-09 MED ORDER — LACTATED RINGERS IV SOLN: INTRAVENOUS | Status: DC

## 2017-12-09 MED ORDER — DEXTROSE-NACL 5-0.45 % IV SOLN: INTRAVENOUS | Status: DC

## 2017-12-09 MED ORDER — PANTOPRAZOLE SODIUM 40 MG IV SOLR
40.0000 mg | INTRAVENOUS | Status: AC
  Administered 2017-12-09: 40 mg via INTRAVENOUS
  Filled 2017-12-09: qty 40

## 2017-12-09 NOTE — Progress Notes (Signed)
Pt refusing ABG at this time. RN and MD are aware.

## 2017-12-09 NOTE — ED Notes (Signed)
Recollected vbg

## 2017-12-09 NOTE — ED Notes (Signed)
Pt refusing blood draw,  Nurse aware.

## 2017-12-09 NOTE — ED Notes (Signed)
ED Provider at bedside. 

## 2017-12-09 NOTE — ED Provider Notes (Signed)
[facility] EMERGENCY DEPARTMENT Provider Note   CSN: [id number] Arrival date & time: 12/09/17  2147    History   Chief Complaint Chief Complaint  Patient presents with  . Abdominal Pain  . Hyperglycemia    LEVEL 5 CAVEAT 2/2 ACUITY OF CONDITION  HPI [name] is a 31 y.o. male.  31 year old male with a history of IDDM with a well-known history of noncompliance, polysubstance abuse, schizoaffective schizophrenia, hypertension, chronic pain presents to the emergency department for pain and vision changes.  He reports a sharp pain in his central chest as well as bilateral blindness.  He states that he has had complete loss of his vision which he noticed upon waking from sleep 1-2 hours ago.  He is intermittently nauseous and notes emesis x 1 at home. He reports compliance with his insulin, but has a longstanding hx of noncompliance with his diabetes medications. Last 2 admissions required ICU admit; one of which necessitated intubation.  Denies any known fevers.      Past Medical History:  Diagnosis Date  . Anemia   . Anxiety   . Arthritis    "both hips; both shoulders" (11/05/2017)  . Chicken pox   . Childhood asthma   . Chronic pain   . Depression   . DKA (diabetic ketoacidoses) ([name]) 11/18/2014  . DKA (diabetic ketoacidoses) ([name]) 07/05/2017  . GERD (gastroesophageal reflux disease)   . Hypertension   . Migraine    "a few/year" (06/06/2017)  . Noncompliance with medication regimen   . Pneumonia    "several times" (06/06/2017)  . Polysubstance abuse ([name])   . Schizo affective schizophrenia ([name])   . Scoliosis   . Type I diabetes mellitus [facility])     Patient Active Problem List   Diagnosis Date Noted  . DKA (diabetic ketoacidosis) ([name]) 11/03/2017  . Pressure injury of skin 10/11/2017  . Encephalopathy acute   . Hyperbilirubinemia 09/14/2017  . Heel ulcer ([name]) 09/14/2017  . Noncompliance with medication regimen   . Hypokalemia   .  Non healing left heel wound   . Cellulitis   . DKA, type 1 ([name]) 08/17/2017  . Encounter for imaging study to confirm orogastric (OG) tube placement   . Acute respiratory acidosis   . DKA (diabetic ketoacidoses) ([name]) 08/04/2017  . Acute respiratory failure with hypoxia ([name])   . Seizure ([name])   . Diabetic ketoacidosis ([name]) 07/30/2017  . Normocytic anemia 07/05/2017  . Numbness of right hand 07/05/2017  . Elevated troponin I measurement   . Positive D dimer   . DKA, type 1, not at goal [facility]) 07/01/2017  . Hyperkalemia   . AKI (acute kidney injury) ([name])   . Medically noncompliant   . Hyperglycemia 06/16/2017  . Nausea & vomiting 06/15/2017  . Abdominal pain 06/15/2017  . Sepsis ([name]) 06/07/2017  . Chest pain 06/07/2017  . GERD (gastroesophageal reflux disease) 06/07/2017  . Substance abuse ([name]) 05/30/2016  . Substance induced mood disorder ([name]) 05/30/2016  . Tobacco use disorder 12/24/2015  . DM hyperosmolarity type I, uncontrolled ([name]) 12/19/2015  . Generalized headache 07/22/2015  . Bilateral hip bursitis 05/26/2015  . Depression   . Generalized anxiety disorder 03/29/2015  . Undifferentiated schizophrenia ([name])   . Hip pain, bilateral 11/18/2014  . Dehydration 06/17/2012    Past Surgical History:  Procedure Laterality Date  . CARDIAC CATHETERIZATION  11/2016  . INCISION AND DRAINAGE ABSCESS Left 11/2011   "MRSA removed off my thumb"  Home Medications    Prior to Admission medications   Medication Sig Start Date End Date Taking? Authorizing Provider  amphetamine-dextroamphetamine (ADDERALL) 30 MG tablet Take 30 mg daily by mouth. 10/02/17   [provider]  doxepin (SINEQUAN) 10 MG/ML solution Take 10 mLs by mouth at bedtime. 07/11/17   [provider]  furosemide (LASIX) 20 MG tablet Take 1 tablet (20 mg total) by mouth daily. 12/05/17   Mikhail, Nita Sells, DO  insulin aspart (NOVOLOG FLEXPEN) 100 UNIT/ML FlexPen Inject 0-15 Units into  the skin See admin instructions. Check Blood Sugar 4 times per day > with meals and at bedtime CBG 70 - 120: 0 units CBG 121 - 150: 2 units CBG 151 - 200: 3 units CBG 201 - 250: 5 units CBG 251 - 300: 8 units CBG 301 - 350: 11 units CBG 351 - 400: 15 units 09/15/17   Rai, Ripudeep K, MD  insulin detemir (LEVEMIR) 100 UNIT/ML injection Inject 0.3 mLs (30 Units total) into the skin daily. 09/15/17   Rai, Delene Ruffini, MD  metoCLOPramide (REGLAN) 10 MG tablet Take 1 tablet (10 mg total) by mouth 3 (three) times daily as needed for nausea or vomiting. Patient not taking: Reported on 12/03/2017 09/15/17   Rai, Delene Ruffini, MD  nicotine (NICODERM CQ - DOSED IN MG/24 HOURS) 14 mg/24hr patch Place 1 patch (14 mg total) onto the skin daily. 12/06/17   Mikhail, Nita Sells, DO  pantoprazole (PROTONIX) 40 MG tablet Take 1 tablet (40 mg total) by mouth 2 (two) times daily before a meal. Patient not taking: Reported on 12/03/2017 10/13/17   Alberteen Sam, MD  sennosides-docusate sodium (SENOKOT-S) 8.6-50 MG tablet Take 1 tablet by mouth as needed for constipation.    [provider]  traZODone (DESYREL) 150 MG tablet Take 150 mg by mouth at bedtime. 06/26/17   [provider]    Family History Family History  Problem Relation Age of Onset  . Diabetes Mother     Social History Social History   Tobacco Use  . Smoking status: Current Every Day Smoker    Packs/day: 0.50    Years: 9.00    Pack years: 4.50    Types: Cigarettes  . Smokeless tobacco: Never Used  Substance Use Topics  . Alcohol use: No  . Drug use: Yes    Types: Marijuana    Comment: 11/05/2017 "nonesince 2012"; pt denies hx of cocaine and methamphetamines use on 11/05/2017     Allergies   Sulfa antibiotics and Fentanyl   Review of Systems Review of Systems  Unable to perform ROS: Acuity of condition     Physical Exam Updated Vital Signs BP 120/74   Pulse 67   Resp 13   SpO2 100%   Physical Exam    Constitutional: He is oriented to person, place, and time. He appears well-developed and well-nourished. He appears distressed.  Chronically ill appearing.  HENT:  Head: Normocephalic and atraumatic.  Dry mm. Gross dental decay.  Eyes: Conjunctivae and EOM are normal. No scleral icterus.  Pupils 83mm bilaterally, sluggish  Neck: Normal range of motion.  No meningismus  Cardiovascular: Regular rhythm and intact distal pulses.  Tachycardia  Pulmonary/Chest: Effort normal. No stridor. No respiratory distress.  No tachypnea or dyspnea. Symmetric chest expansion.  Musculoskeletal: Normal range of motion.  Neurological: He is alert and oriented to person, place, and time. He exhibits normal muscle tone. Coordination normal.  GCS 15. Answers questions appropriately and follows commands. Patient moving  all extremities spontaneously.  Skin: Skin is warm and dry. No rash noted. He is not diaphoretic. No erythema. No pallor.  Psychiatric: He has a normal mood and affect. His behavior is normal.  Nursing note and vitals reviewed.    ED Treatments / Results  Labs (all labs ordered are listed, but only abnormal results are displayed) Labs Reviewed  CBC WITH DIFFERENTIAL/PLATELET - Abnormal; Notable for the following components:      Result Value   WBC 21.9 (*)    RBC 3.93 (*)    Hemoglobin 11.9 (*)    HCT 36.2 (*)    Platelets 570 (*)    Neutro Abs 18.2 (*)    Monocytes Absolute 1.5 (*)    All other components within normal limits  COMPREHENSIVE METABOLIC PANEL - Abnormal; Notable for the following components:   Sodium 130 (*)    Potassium 6.1 (*)    CO2 <7 (*)    Glucose, Bld 510 (*)    BUN 22 (*)    Creatinine, Ser 1.60 (*)    Calcium 8.5 (*)    Total Protein 6.4 (*)    Total Bilirubin 2.4 (*)    GFR calc non Af Amer 55 (*)    All other components within normal limits  CBG MONITORING, ED - Abnormal; Notable for the following components:   Glucose-Capillary 445 (*)    All other  components within normal limits  URINALYSIS, ROUTINE W REFLEX MICROSCOPIC  VOLATILES,BLD-ACETONE,ETHANOL,ISOPROP,METHANOL  BLOOD GAS, VENOUS  I-STAT ARTERIAL BLOOD GAS, ED    EKG  EKG Interpretation  Date/Time:  Sunday December 09 2017 22:10:24 EST Ventricular Rate:  112 PR Interval:    QRS Duration: 92 QT Interval:  341 QTC Calculation: 464 R Axis:   55 Text Interpretation:  Sinus tachycardia ST elev, probable normal early repol pattern Baseline wander in lead(s) V2 TECHNICALLY DIFFICULT wavering baseline Otherwise no significant change Confirmed by Melene Plan [phone]) on 12/09/2017 11:39:21 PM       Radiology No results found.  Procedures Procedures (including critical care time)  Medications Ordered in ED Medications  lactated ringers bolus 2,000 mL (2,000 mLs Intravenous New Bag/Given 12/09/17 2259)  dextrose 5 %-0.45 % sodium chloride infusion (not administered)  insulin regular (NOVOLIN R,HUMULIN R) 100 Units in sodium chloride 0.9 % 100 mL (1 Units/mL) infusion (not administered)  lactated ringers infusion (not administered)  sodium bicarbonate injection 50 mEq (not administered)  sodium chloride 0.9 % bolus 1,000 mL (0 mLs Intravenous Stopped 12/10/17 0018)  ondansetron (ZOFRAN) injection 4 mg (4 mg Intravenous Given 12/09/17 2221)  HYDROmorphone (DILAUDID) injection 0.5 mg (0.5 mg Intravenous Given 12/09/17 2249)  pantoprazole (PROTONIX) injection 40 mg (40 mg Intravenous Given 12/09/17 2252)    CRITICAL CARE Performed by: Antony Madura   Total critical care time: 45 minutes  Critical care time was exclusive of separately billable procedures and treating other patients.  Critical care was necessary to treat or prevent imminent or life-threatening deterioration.  Critical care was time spent personally by me on the following activities: development of treatment plan with patient and/or surrogate as well as nursing, discussions with consultants, evaluation of  patient's response to treatment, examination of patient, obtaining history from patient or surrogate, ordering and performing treatments and interventions, ordering and review of laboratory studies, ordering and review of radiographic studies, pulse oximetry and re-evaluation of patient's condition.   Initial Impression / Assessment and Plan / ED Course  I have reviewed the triage vital signs and  the nursing notes.  Pertinent labs & imaging results that were available during my care of the patient were reviewed by me and considered in my medical decision making (see chart for details).     11:15 PM Case discussed with Dr. Laurence Slate of Neurology regarding c/o bilateral blindness. Dr. Laurence Slate recommends MRI brain w/o contrast to r/o PRES though this is thought less likely in the setting of normal BP. No current concern for methanol toxicity. Patient does have a history of polysubstance abuse, though this has been associated with illicit drug use rather than ETOH. Last positive BAC was in May 2016. Will add toxic alcohols for completeness, though this will be a send out test. Patient denies ingestion of any toxic alcohols or derivatives.   11:45 PM Unable to assess anion gap given CO2 <7. Bicarb ordered. Electrolyte derangements c/w DKA. Glucose stabilizer ordered. Anticipate PCCM admit.  11:58 PM I-Stat VBG has been unreadable causing error message x 3. I-Stat ABG ordered, but patient is refusing this stick. He states "let me rest for 30 minutes". I have explained to the patient the need for this test and the urgency associated with it. He continues to refuse additional blood draws in spite of this. Given history, will discuss case with PCCM for admission. Respiratory told to reattempt blood draw in 30 minutes.  12:20 AM Case discussed with Dr. Sherene Sires who thinks that patient is appropriate for Stepdown irregardless of pH and anion gap if he appears clinically stable for that unit. He requests consult to  [facility] to discuss admission at this time. Consult placed.  12:42 AM Case discussed with Dr. Antionette Char. He will evaluate the patient in the ED for admission.   Final Clinical Impressions(s) / ED Diagnoses   Final diagnoses:  Diabetic ketoacidosis without coma associated with type 1 diabetes mellitus [facility])  Visual disturbance    ED Discharge Orders    None       Antony Madura, PA-C 12/10/17 0044    Melene Plan, DO 12/11/17 2004

## 2017-12-09 NOTE — ED Notes (Signed)
PIV infiltrated; will start fluids after PIV established.

## 2017-12-09 NOTE — ED Notes (Signed)
Pt. Informed we need urine sample.

## 2017-12-09 NOTE — ED Triage Notes (Signed)
Pt BIB EMS from home for CP/epigastric pain and hyperglycemia. Pt CBG 452 with EMS. Pt anxious on arrival, yelling out for help and that he is blind and that he needs pain medicine. Pt not answering questions regarding his condition at this time and refusing labs to be drawn at this time. Dried blood noted on mouth.

## 2017-12-10 ENCOUNTER — Inpatient Hospital Stay (HOSPITAL_COMMUNITY): Payer: Medicare Other

## 2017-12-10 ENCOUNTER — Emergency Department (HOSPITAL_COMMUNITY): Payer: Medicare Other

## 2017-12-10 ENCOUNTER — Encounter (HOSPITAL_COMMUNITY): Payer: Self-pay | Admitting: Family Medicine

## 2017-12-10 DIAGNOSIS — F259 Schizoaffective disorder, unspecified: Secondary | ICD-10-CM | POA: Diagnosis present

## 2017-12-10 DIAGNOSIS — Z9119 Patient's noncompliance with other medical treatment and regimen: Secondary | ICD-10-CM | POA: Diagnosis not present

## 2017-12-10 DIAGNOSIS — G8929 Other chronic pain: Secondary | ICD-10-CM | POA: Diagnosis present

## 2017-12-10 DIAGNOSIS — E1069 Type 1 diabetes mellitus with other specified complication: Secondary | ICD-10-CM

## 2017-12-10 DIAGNOSIS — E1065 Type 1 diabetes mellitus with hyperglycemia: Secondary | ICD-10-CM

## 2017-12-10 DIAGNOSIS — R079 Chest pain, unspecified: Secondary | ICD-10-CM

## 2017-12-10 DIAGNOSIS — E1011 Type 1 diabetes mellitus with ketoacidosis with coma: Secondary | ICD-10-CM | POA: Diagnosis not present

## 2017-12-10 DIAGNOSIS — H539 Unspecified visual disturbance: Secondary | ICD-10-CM | POA: Diagnosis present

## 2017-12-10 DIAGNOSIS — D72829 Elevated white blood cell count, unspecified: Secondary | ICD-10-CM | POA: Diagnosis present

## 2017-12-10 DIAGNOSIS — E876 Hypokalemia: Secondary | ICD-10-CM | POA: Diagnosis not present

## 2017-12-10 DIAGNOSIS — E875 Hyperkalemia: Secondary | ICD-10-CM

## 2017-12-10 DIAGNOSIS — H547 Unspecified visual loss: Secondary | ICD-10-CM | POA: Diagnosis not present

## 2017-12-10 DIAGNOSIS — H35033 Hypertensive retinopathy, bilateral: Secondary | ICD-10-CM | POA: Diagnosis present

## 2017-12-10 DIAGNOSIS — I10 Essential (primary) hypertension: Secondary | ICD-10-CM | POA: Diagnosis present

## 2017-12-10 DIAGNOSIS — F17210 Nicotine dependence, cigarettes, uncomplicated: Secondary | ICD-10-CM | POA: Diagnosis present

## 2017-12-10 DIAGNOSIS — N179 Acute kidney failure, unspecified: Secondary | ICD-10-CM | POA: Diagnosis not present

## 2017-12-10 DIAGNOSIS — D649 Anemia, unspecified: Secondary | ICD-10-CM

## 2017-12-10 DIAGNOSIS — E1010 Type 1 diabetes mellitus with ketoacidosis without coma: Secondary | ICD-10-CM | POA: Diagnosis not present

## 2017-12-10 DIAGNOSIS — M25552 Pain in left hip: Secondary | ICD-10-CM | POA: Diagnosis not present

## 2017-12-10 DIAGNOSIS — F329 Major depressive disorder, single episode, unspecified: Secondary | ICD-10-CM | POA: Diagnosis present

## 2017-12-10 DIAGNOSIS — K219 Gastro-esophageal reflux disease without esophagitis: Secondary | ICD-10-CM | POA: Diagnosis present

## 2017-12-10 DIAGNOSIS — R7989 Other specified abnormal findings of blood chemistry: Secondary | ICD-10-CM | POA: Diagnosis present

## 2017-12-10 DIAGNOSIS — F419 Anxiety disorder, unspecified: Secondary | ICD-10-CM | POA: Diagnosis present

## 2017-12-10 DIAGNOSIS — E103293 Type 1 diabetes mellitus with mild nonproliferative diabetic retinopathy without macular edema, bilateral: Secondary | ICD-10-CM | POA: Diagnosis present

## 2017-12-10 DIAGNOSIS — E861 Hypovolemia: Secondary | ICD-10-CM | POA: Diagnosis present

## 2017-12-10 DIAGNOSIS — R0789 Other chest pain: Secondary | ICD-10-CM | POA: Diagnosis present

## 2017-12-10 DIAGNOSIS — M25551 Pain in right hip: Secondary | ICD-10-CM | POA: Diagnosis not present

## 2017-12-10 DIAGNOSIS — Z79899 Other long term (current) drug therapy: Secondary | ICD-10-CM | POA: Diagnosis not present

## 2017-12-10 LAB — GLUCOSE, CAPILLARY
GLUCOSE-CAPILLARY: 127 mg/dL — AB (ref 65–99)
GLUCOSE-CAPILLARY: 119 mg/dL — AB (ref 65–99)
GLUCOSE-CAPILLARY: 143 mg/dL — AB (ref 65–99)
GLUCOSE-CAPILLARY: 147 mg/dL — AB (ref 65–99)
GLUCOSE-CAPILLARY: 95 mg/dL (ref 65–99)
GLUCOSE-CAPILLARY: 157 mg/dL — AB (ref 65–99)
Glucose-Capillary: 217 mg/dL — ABNORMAL HIGH (ref 65–99)
Glucose-Capillary: 202 mg/dL — ABNORMAL HIGH (ref 65–99)
Glucose-Capillary: 171 mg/dL — ABNORMAL HIGH (ref 65–99)
Glucose-Capillary: 142 mg/dL — ABNORMAL HIGH (ref 65–99)
Glucose-Capillary: 136 mg/dL — ABNORMAL HIGH (ref 65–99)
Glucose-Capillary: 128 mg/dL — ABNORMAL HIGH (ref 65–99)
Glucose-Capillary: 224 mg/dL — ABNORMAL HIGH (ref 65–99)

## 2017-12-10 LAB — URINALYSIS, ROUTINE W REFLEX MICROSCOPIC
BILIRUBIN URINE: NEGATIVE
KETONES UR: 80 mg/dL — AB
Leukocytes, UA: NEGATIVE
NITRITE: NEGATIVE
PH: 5 (ref 5.0–8.0)
Protein, ur: 30 mg/dL — AB
RBC / HPF: NONE SEEN RBC/hpf (ref 0–5)
Specific Gravity, Urine: 1.011 (ref 1.005–1.030)

## 2017-12-10 LAB — BASIC METABOLIC PANEL
ANION GAP: 9 (ref 5–15)
BUN: 24 mg/dL — ABNORMAL HIGH (ref 6–20)
BUN: 15 mg/dL (ref 6–20)
CHLORIDE: 111 mmol/L (ref 101–111)
CO2: <7 mmol/L — ABNORMAL LOW (ref 22–32)
CO2: 16 mmol/L — AB (ref 22–32)
Calcium: 8.4 mg/dL — ABNORMAL LOW (ref 8.9–10.3)
Calcium: 8.3 mg/dL — ABNORMAL LOW (ref 8.9–10.3)
Chloride: 112 mmol/L — ABNORMAL HIGH (ref 101–111)
Creatinine, Ser: 1.44 mg/dL — ABNORMAL HIGH (ref 0.61–1.24)
Creatinine, Ser: 1.14 mg/dL (ref 0.61–1.24)
GFR calc Af Amer: >60 mL/min (ref >60)
GFR calc non Af Amer: >60 mL/min (ref >60)
GLUCOSE: 104 mg/dL — AB (ref 65–99)
Glucose, Bld: 306 mg/dL — ABNORMAL HIGH (ref 65–99)
POTASSIUM: 4.7 mmol/L (ref 3.5–5.1)
POTASSIUM: 3.6 mmol/L (ref 3.5–5.1)
SODIUM: 136 mmol/L (ref 135–145)
Sodium: 137 mmol/L (ref 135–145)

## 2017-12-10 LAB — CBG MONITORING, ED: Glucose-Capillary: 405 mg/dL — ABNORMAL HIGH (ref 65–99)

## 2017-12-10 LAB — BLOOD GAS, ARTERIAL
ACID-BASE DEFICIT: 28.7 mmol/L — AB (ref 0.0–2.0)
BICARBONATE: 1.9 mmol/L — AB (ref 20.0–28.0)
Drawn by: 44135
FIO2: 21
O2 Saturation: 97.8 %
PH ART: 6.887 — AB (ref 7.350–7.450)
Patient temperature: 98.6
pO2, Arterial: 149 mmHg — ABNORMAL HIGH (ref 83.0–108.0)

## 2017-12-10 LAB — TROPONIN I: TROPONIN I: 0.03 ng/mL — AB (ref <0.03)

## 2017-12-10 LAB — HEMOGLOBIN A1C
Hgb A1c MFr Bld: 8.1 % — ABNORMAL HIGH (ref 4.8–5.6)
Mean Plasma Glucose: 185.77 mg/dL

## 2017-12-10 LAB — VOLATILES,BLD-ACETONE,ETHANOL,ISOPROP,METHANOL
Acetone, blood: 0.031 % — ABNORMAL HIGH (ref 0.000–0.010)
Ethanol, blood: NEGATIVE % (ref 0.000–0.010)
Isopropanol, blood: NEGATIVE % (ref 0.000–0.010)
Methanol, blood: NEGATIVE % (ref 0.000–0.010)

## 2017-12-10 MED ORDER — TRAZODONE HCL 150 MG PO TABS
150.0000 mg | ORAL_TABLET | Freq: Every day | ORAL | Status: DC
  Administered 2017-12-10 – 2017-12-11 (×2): 150 mg via ORAL
  Filled 2017-12-10 (×2): qty 1

## 2017-12-10 MED ORDER — SODIUM BICARBONATE 8.4 % IV SOLN
50.0000 meq | INTRAVENOUS | Status: AC
  Administered 2017-12-10 (×2): 50 meq via INTRAVENOUS
  Filled 2017-12-10 (×2): qty 50

## 2017-12-10 MED ORDER — INSULIN REGULAR HUMAN 100 UNIT/ML IJ SOLN
INTRAMUSCULAR | Status: DC
  Administered 2017-12-10: 2.4 [IU]/h via INTRAVENOUS
  Administered 2017-12-10: 0.1 [IU]/h via INTRAVENOUS

## 2017-12-10 MED ORDER — SENNOSIDES-DOCUSATE SODIUM 8.6-50 MG PO TABS: 1.0000 | ORAL_TABLET | Freq: Every day | ORAL | Status: DC | PRN

## 2017-12-10 MED ORDER — INSULIN DETEMIR 100 UNIT/ML ~~LOC~~ SOLN
10.0000 [IU] | Freq: Two times a day (BID) | SUBCUTANEOUS | Status: DC
  Administered 2017-12-10: 10 [IU] via SUBCUTANEOUS
  Filled 2017-12-10 (×4): qty 0.1

## 2017-12-10 MED ORDER — STERILE WATER FOR INJECTION IV SOLN
INTRAVENOUS | Status: DC
  Administered 2017-12-10: 09:00:00 via INTRAVENOUS
  Filled 2017-12-10 (×2): qty 9.71

## 2017-12-10 MED ORDER — ENOXAPARIN SODIUM 30 MG/0.3ML ~~LOC~~ SOLN
30.0000 mg | SUBCUTANEOUS | Status: DC
  Administered 2017-12-10 – 2017-12-11 (×2): 30 mg via SUBCUTANEOUS
  Filled 2017-12-10 (×2): qty 0.3

## 2017-12-10 MED ORDER — PANTOPRAZOLE SODIUM 40 MG IV SOLR
40.0000 mg | INTRAVENOUS | Status: DC
  Administered 2017-12-10 – 2017-12-12 (×3): 40 mg via INTRAVENOUS
  Filled 2017-12-10 (×3): qty 40

## 2017-12-10 MED ORDER — INSULIN ASPART 100 UNIT/ML ~~LOC~~ SOLN
0.0000 [IU] | Freq: Three times a day (TID) | SUBCUTANEOUS | Status: DC
  Administered 2017-12-10: 2 [IU] via SUBCUTANEOUS
  Administered 2017-12-11: 5 [IU] via SUBCUTANEOUS
  Administered 2017-12-11: 2 [IU] via SUBCUTANEOUS

## 2017-12-10 MED ORDER — MUPIROCIN CALCIUM 2 % EX CREA
TOPICAL_CREAM | Freq: Every day | CUTANEOUS | Status: DC
  Administered 2017-12-10 – 2017-12-11 (×2): via TOPICAL
  Filled 2017-12-10: qty 15

## 2017-12-10 MED ORDER — INSULIN ASPART 100 UNIT/ML ~~LOC~~ SOLN
0.0000 [IU] | Freq: Every day | SUBCUTANEOUS | Status: DC
  Administered 2017-12-10 – 2017-12-11 (×2): 2 [IU] via SUBCUTANEOUS

## 2017-12-10 MED ORDER — GLUCERNA PO LIQD
237.0000 mL | Freq: Three times a day (TID) | ORAL | Status: DC
  Administered 2017-12-10 – 2017-12-12 (×4): 237 mL via ORAL
  Filled 2017-12-10 (×9): qty 237

## 2017-12-10 MED ORDER — SODIUM CHLORIDE 0.9 % IV SOLN
INTRAVENOUS | Status: DC
  Administered 2017-12-10: 03:00:00 via INTRAVENOUS

## 2017-12-10 MED ORDER — DEXTROSE-NACL 5-0.45 % IV SOLN
INTRAVENOUS | Status: DC
  Administered 2017-12-10: 07:00:00 via INTRAVENOUS

## 2017-12-10 MED ORDER — INSULIN ASPART 100 UNIT/ML ~~LOC~~ SOLN
3.0000 [IU] | Freq: Three times a day (TID) | SUBCUTANEOUS | Status: DC
  Administered 2017-12-10 – 2017-12-12 (×5): 3 [IU] via SUBCUTANEOUS

## 2017-12-10 MED ORDER — NICOTINE 14 MG/24HR TD PT24
14.0000 mg | MEDICATED_PATCH | Freq: Every day | TRANSDERMAL | Status: DC
  Administered 2017-12-10 – 2017-12-12 (×3): 14 mg via TRANSDERMAL
  Filled 2017-12-10 (×3): qty 1

## 2017-12-10 MED ORDER — DOXEPIN HCL 100 MG PO CAPS
100.0000 mg | ORAL_CAPSULE | Freq: Every day | ORAL | Status: DC
  Administered 2017-12-10 – 2017-12-11 (×2): 100 mg via ORAL
  Filled 2017-12-10 (×3): qty 1

## 2017-12-10 MED ORDER — SODIUM CHLORIDE 0.9 % IV SOLN
INTRAVENOUS | Status: DC
  Administered 2017-12-10 – 2017-12-11 (×2): via INTRAVENOUS

## 2017-12-10 MED ORDER — HEPARIN SODIUM (PORCINE) 5000 UNIT/ML IJ SOLN
5000.0000 [IU] | Freq: Three times a day (TID) | INTRAMUSCULAR | Status: DC
  Administered 2017-12-10: 5000 [IU] via SUBCUTANEOUS
  Filled 2017-12-10: qty 1

## 2017-12-10 MED ORDER — ACETAMINOPHEN 325 MG PO TABS: 650.0000 mg | ORAL_TABLET | Freq: Four times a day (QID) | ORAL | Status: DC | PRN

## 2017-12-10 MED ORDER — INSULIN ASPART 100 UNIT/ML ~~LOC~~ SOLN
10.0000 [IU] | Freq: Once | SUBCUTANEOUS | Status: AC
  Administered 2017-12-10: 10 [IU] via INTRAVENOUS
  Filled 2017-12-10: qty 1

## 2017-12-10 MED ORDER — SODIUM CHLORIDE 0.9 % IV BOLUS (SEPSIS)
500.0000 mL | Freq: Once | INTRAVENOUS | Status: AC
  Administered 2017-12-10: 500 mL via INTRAVENOUS

## 2017-12-10 MED ORDER — HYDROCODONE-ACETAMINOPHEN 5-325 MG PO TABS
1.0000 | ORAL_TABLET | ORAL | Status: DC | PRN
  Administered 2017-12-10: 1 via ORAL
  Administered 2017-12-10 – 2017-12-11 (×6): 2 via ORAL
  Administered 2017-12-12: 1 via ORAL
  Filled 2017-12-10 (×8): qty 2

## 2017-12-10 NOTE — Progress Notes (Signed)
[facility]                                                                              Patient Demographics  [name], is a 31 y.o. male, DOB - 1986/02/12, FGB:[id number]  Admit date - 12/09/2017   Admitting Physician Briscoe Deutscher, MD  Outpatient Primary MD for the patient is Fleet Contras, MD  Outpatient specialists:   LOS - 0  days   Medical records reviewed and are as summarized below:    Chief Complaint  Patient presents with  . Abdominal Pain  . Hyperglycemia       Brief summary   Patient is a 31 year old male with type 1 diabetes, schizophrenia, chronic pain, GERD, severe medical noncompliance presented with acute vision loss, chest pain, epigastric pain. In ED, patient was found to have glucose of 510, sodium 130, potassium 6.1, bicarb undetectable low, creatinine 1.6, leukocytosis with WBC count 21.9, chronic anemia.  ABG showed pH of 6.89, PCO2 undetectably low, PO2 149.  Critical care was consulted for admission and recommended hospitalist admission to stepdown unit.  Patient was admitted for management of DKA and acute vision loss.   Assessment & Plan    Principal Problem:   DKA, type 1, not at goal [facility]), severely noncompliant, high anion gap metabolic acidosis with bicarb <7 -PH 6.89, anion gap >20 on admission, glucose 510, creatinine 1.6, potassium 6.1 -Patient was started on IV fluid hydration, IV insulin drip -Bicarb persistently low, placed on bicarb drip until >15 -Severely noncompliant with his treatment plan, 14 admissions in the last 6 months, just discharged on 12/19. -Obtain stat BMET, continue IV insulin drip with IV fluid hydration until severe acidosis improves -Diabetic coordinator consult placed  Active Problems: Vision loss bilateral -No focal neurological deficits, MRI of the brain negative for any stroke -Evaluated by neurology, possibly due to DKA versus conversion disorder.  Currently patient is refusing  examination, sleepy, if no significant improvement with DKA resolution, will consult ophthalmology (vs psychiatry)    Chest pain atypical, also has history of chronic pain - Troponins negative, repeat EKG, minimize IV narcotic use    Hyperkalemia -Secondary to DKA, improved with insulin drip    AKI (acute kidney injury) ([name]) -Presented with creatinine of 1.6, creatinine was 0.8 on 12/19 - Continue aggressive IV fluid hydration, creatinine improving 1.4 today    Normocytic anemia -Hemoglobin 11.9 on admission, unknown baseline, no bleeding  Schizophrenia -Evaluated by psychiatry during the last admission, continue doxepin, trazodone   Code Status: full  DVT Prophylaxis: Heparin subcu Family Communication: Discussed in detail with the patient, all imaging results, lab results explained to the patient    Disposition Plan: Continue monitoring in stepdown unit  Time Spent in minutes   35 minutes  Procedures:  None  Consultants:   P CCM was consulted in ED  Antimicrobials:      Medications  Scheduled Meds: . doxepin  100 mg Oral QHS  . heparin  5,000 Units Subcutaneous Q8H  . mupirocin cream   Topical Daily  . nicotine  14 mg Transdermal Daily  . pantoprazole (PROTONIX) IV  40 mg  Intravenous Q24H  . traZODone  150 mg Oral QHS   Continuous Infusions: . sodium chloride 125 mL/hr at 12/10/17 0257  . dextrose 5 % and 0.45% NaCl 100 mL/hr at 12/10/17 0640  . insulin (NOVOLIN-R) infusion 0.1 Units/hr (12/10/17 1021)  .  sodium bicarbonate infusion 1/4 NS 1000 mL 100 mL/hr at 12/10/17 0835   PRN Meds:.acetaminophen, HYDROcodone-acetaminophen, senna-docusate   Antibiotics   Anti-infectives (From admission, onward)   None        Subjective:   Zyran Eyer was seen and examined today.  Somnolent, refuses examination or opening his eyes.  Arousable but goes back to sleep.   Objective:   Vitals:   12/10/17 0132 12/10/17 0300 12/10/17 0539 12/10/17 0749    BP: (!) 100/59 98/74 95/72    Pulse: (!) 106 (!) 105 (!) 108   Resp: 14 (!) 27 15 14   Temp:  (!) 97.2 F (36.2 C)  97.8 F (36.6 C)  TempSrc:  Axillary  Axillary  SpO2: 99% 100% 100% 100%  Weight:  64.3 kg (141 lb 12.1 oz)    Height:  6\' 1"  (1.854 m)      Intake/Output Summary (Last 24 hours) at 12/10/2017 1029 Last data filed at 12/10/2017 0640 Gross per 24 hour  Intake 3873.32 ml  Output 1900 ml  Net 1973.32 ml     Wt Readings from Last 3 Encounters:  12/10/17 64.3 kg (141 lb 12.1 oz)  12/04/17 70.3 kg (155 lb)  11/03/17 64.6 kg (142 lb 6.7 oz)     Exam  General: Somnolent, easily arousable but refuses examination.,  NAD   Eyes: Keeping his eyes closed  HEENT:  Atraumatic, normocephalic  Cardiovascular: S1 S2 auscultated, no rubs, murmurs or gallops. Regular rate and rhythm.  Tachycardia  Respiratory: Clear to auscultation bilaterally, no wheezing, rales or rhonchi  Gastrointestinal: Soft, nontender, nondistended, + bowel sounds  Ext: no pedal edema bilaterally  Neuro: does not cooperate  Musculoskeletal: No digital cyanosis, clubbing  Skin: No rashes  Psych: sleepy, does not cooperate   Data Reviewed:  I have personally reviewed following labs and imaging studies  Micro Results Recent Results (from the past 240 hour(s))  Culture, blood (routine x 2)     Status: None   Collection Time: 12/02/17  8:25 PM  Result Value Ref Range Status   Specimen Description BLOOD LEFT HAND FINGER  Final   Special Requests IN PEDIATRIC BOTTLE Blood Culture adequate volume  Final   Culture NO GROWTH 5 DAYS  Final   Report Status 12/07/2017 FINAL  Final  Culture, blood (routine x 2)     Status: None   Collection Time: 12/02/17  8:40 PM  Result Value Ref Range Status   Specimen Description BLOOD RIGHT HAND  Final   Special Requests IN PEDIATRIC BOTTLE Blood Culture adequate volume  Final   Culture NO GROWTH 5 DAYS  Final   Report Status 12/07/2017 FINAL  Final   MRSA PCR Screening     Status: None   Collection Time: 12/02/17  9:41 PM  Result Value Ref Range Status   MRSA by PCR NEGATIVE NEGATIVE Final    Comment:        The GeneXpert MRSA Assay (FDA approved for NASAL specimens only), is one component of a comprehensive MRSA colonization surveillance program. It is not intended to diagnose MRSA infection nor to guide or monitor treatment for MRSA infections.     Radiology Reports Mr Brain Wo Contrast  Result Date: 12/10/2017 CLINICAL  DATA:  New onset bilateral blindness. EXAM: MRI HEAD WITHOUT CONTRAST TECHNIQUE: Multiplanar, multiecho pulse sequences of the brain and surrounding structures were obtained without intravenous contrast. COMPARISON:  Head CT 10/20/2017 FINDINGS: Brain: The midline structures are normal. There is no acute infarct or acute hemorrhage. No mass lesion, hydrocephalus, dural abnormality or extra-axial collection. The brain parenchymal signal is normal. No age-advanced or lobar predominant atrophy. No chronic microhemorrhage or superficial siderosis. Probable developmental venous anomaly in the left parietal lobe. Vascular: Major intracranial arterial and venous sinus flow voids are preserved. Skull and upper cervical spine: The visualized skull base, calvarium, upper cervical spine and extracranial soft tissues are normal. Sinuses/Orbits: No fluid levels or advanced mucosal thickening. No mastoid or middle ear effusion. Normal orbits. IMPRESSION: Normal MRI of the brain. Electronically Signed   By: Deatra Robinson M.D.   On: 12/10/2017 03:10   Dg Chest Port 1 View  Result Date: 12/10/2017 CLINICAL DATA:  Acute onset of shortness of breath and generalized chest pain. EXAM: PORTABLE CHEST 1 VIEW COMPARISON:  Chest radiograph performed 12/02/2017 FINDINGS: The lungs are well-aerated and clear. There is no evidence of focal opacification, pleural effusion or pneumothorax. The left costophrenic angle is incompletely imaged on  this study. The cardiomediastinal silhouette is within normal limits. No acute osseous abnormalities are seen. IMPRESSION: No acute cardiopulmonary process seen. Electronically Signed   By: Roanna Raider M.D.   On: 12/10/2017 01:24   Dg Chest Portable 1 View  Result Date: 12/02/2017 CLINICAL DATA:  Hyperglycemia.  Hypotension. EXAM: PORTABLE CHEST 1 VIEW COMPARISON:  November 03, 2017 FINDINGS: There is no edema or consolidation. The heart size and pulmonary vascularity are normal. No adenopathy. No evident bone lesions. IMPRESSION: No edema or consolidation. Electronically Signed   By: Bretta Bang III M.D.   On: 12/02/2017 19:57    Lab Data:  CBC: Recent Labs  Lab 12/04/17 0336 12/05/17 0432 12/09/17 2233  WBC 8.7 4.3 21.9*  NEUTROABS  --   --  18.2*  HGB 8.9* 8.7* 11.9*  HCT 25.2* 26.2* 36.2*  MCV 84.6 86.8 92.1  PLT 239 215 570*   Basic Metabolic Panel: Recent Labs  Lab 12/03/17 1831 12/04/17 0336 12/04/17 1451 12/05/17 0432 12/09/17 2233 12/10/17 0305  NA 142 139  --  140 130* 136  K 2.8* 3.3*  --  3.2* 6.1* 4.7  CL 116* 109  --  106 104 111  CO2 22 20*  --  26 <7* <7*  GLUCOSE 118* 330*  --  130* 510* 306*  BUN 16 11  --  6 22* 24*  CREATININE 0.87 1.06  --  0.80 1.60* 1.44*  CALCIUM 7.9* 7.9*  --  7.9* 8.5* 8.4*  MG  --  1.8 2.2 1.9  --   --   PHOS  --  1.6* 2.0* 3.0  --   --    GFR: Estimated Creatinine Clearance: 66.4 mL/min (A) (by C-G formula based on SCr of 1.44 mg/dL (H)). Liver Function Tests: Recent Labs  Lab 12/09/17 2233  AST 17  ALT 18  ALKPHOS 114  BILITOT 2.4*  PROT 6.4*  ALBUMIN 3.9   No results for input(s): LIPASE, AMYLASE in the last 168 hours. No results for input(s): AMMONIA in the last 168 hours. Coagulation Profile: No results for input(s): INR, PROTIME in the last 168 hours. Cardiac Enzymes: Recent Labs  Lab 12/10/17 0305  TROPONINI <0.03   BNP (last 3 results) No results for input(s): PROBNP in the  last 8760  hours. HbA1C: No results for input(s): HGBA1C in the last 72 hours. CBG: Recent Labs  Lab 12/10/17 0538 12/10/17 0648 12/10/17 0745 12/10/17 0914 12/10/17 1020  GLUCAP 171* 142* 127* 119* 136*   Lipid Profile: No results for input(s): CHOL, HDL, LDLCALC, TRIG, CHOLHDL, LDLDIRECT in the last 72 hours. Thyroid Function Tests: No results for input(s): TSH, T4TOTAL, FREET4, T3FREE, THYROIDAB in the last 72 hours. Anemia Panel: No results for input(s): VITAMINB12, FOLATE, FERRITIN, TIBC, IRON, RETICCTPCT in the last 72 hours. Urine analysis:    Component Value Date/Time   COLORURINE STRAW (A) 12/10/2017 0133   APPEARANCEUR CLEAR 12/10/2017 0133   LABSPEC 1.011 12/10/2017 0133   PHURINE 5.0 12/10/2017 0133   GLUCOSEU >=500 (A) 12/10/2017 0133   HGBUR SMALL (A) 12/10/2017 0133   BILIRUBINUR NEGATIVE 12/10/2017 0133   BILIRUBINUR negative 03/29/2015 1114   KETONESUR 80 (A) 12/10/2017 0133   PROTEINUR 30 (A) 12/10/2017 0133   UROBILINOGEN 0.2 05/17/2015 1810   NITRITE NEGATIVE 12/10/2017 0133   LEUKOCYTESUR NEGATIVE 12/10/2017 0133     Ripudeep Rai M.D. [facility] 12/10/2017, 10:29 AM  Pager: [phone] Between 7am to 7pm - call Pager - [phone]  After 7pm go to [email or web address] - password [email or web address]  Call night coverage person covering after 7pm

## 2017-12-10 NOTE — ED Notes (Signed)
Delay in lab draw,  Pt currently in MRI 

## 2017-12-10 NOTE — Consult Note (Signed)
Neurology Consultation Reason for Consult: Bilateral vision loss Referring Physician: Antony Madura  History is obtained from: Patient and chart review  HPI: [name] is a 31 y.o. male with medical history significant for type 1 diabetes mellitus, schizophrenia, chronic pain, GERD presents with DKA and bilateral blindness. Patient states he got up to go to bathroom and unable to see from both eyes. He is able to see light and dark, as well as make out some shapes nut unable to finger count. Not associated with pain. He also describes pain in the epigastrium and lower chest similar to pain he has had before.    ROS: A 14 point ROS was performed and is negative except as noted in the HPI.   Past Medical History:  Diagnosis Date  . Anemia   . Anxiety   . Arthritis    "both hips; both shoulders" (11/05/2017)  . Chicken pox   . Childhood asthma   . Chronic pain   . Depression   . DKA (diabetic ketoacidoses) ([name]) 11/18/2014  . DKA (diabetic ketoacidoses) ([name]) 07/05/2017  . GERD (gastroesophageal reflux disease)   . Hypertension   . Migraine    "a few/year" (06/06/2017)  . Noncompliance with medication regimen   . Pneumonia    "several times" (06/06/2017)  . Polysubstance abuse ([name])   . Schizo affective schizophrenia ([name])   . Scoliosis   . Type I diabetes mellitus ([name])      Family History  Problem Relation Age of Onset  . Diabetes Mother      Social History:  reports that he has been smoking cigarettes.  He has a 4.50 pack-year smoking history. he has never used smokeless tobacco. He reports that he uses drugs. Drug: Marijuana. He reports that he does not drink alcohol.   Exam: Current vital signs: BP 95/72 (BP Location: Left Arm)   Pulse (!) 108   Temp (!) 97.2 F (36.2 C) (Axillary)   Resp 15   Ht 6\' 1"  (1.854 m)   Wt 64.3 kg (141 lb 12.1 oz)   SpO2 100%   BMI 18.70 kg/m  Vital signs in last 24 hours: Temp:  [97.2 F (36.2 C)] 97.2 F (36.2 C) (12/24  0300) Pulse Rate:  [67-114] 108 (12/24 0539) Resp:  [13-27] 15 (12/24 0539) BP: (95-132)/(48-80) 95/72 (12/24 0539) SpO2:  [98 %-100 %] 100 % (12/24 0539) Weight:  [64.3 kg (141 lb 12.1 oz)] 64.3 kg (141 lb 12.1 oz) (12/24 0300)   Physical Exam  Constitutional: Appears well-developed and well-nourished.  Psych: Affect appropriate to situation Eyes: No scleral injection HENT: No OP obstrucion Head: Normocephalic.  Cardiovascular: Normal rate and regular rhythm.  Respiratory: Effort normal, non-labored breathing GI: Soft.  No distension. There is no tenderness.  Skin: WDI  Neuro: Mental Status: Patient is awake, alert, oriented to person, place, month, year, and situation Patient is able to give a clear and coherent history. No signs of aphasia or neglect Cranial Nerves: II: Visual Fields: difficult to assess, apparently impaired. Blinks to threat bilaterally. VA: unable to finger count in both eyes. Attempted to see for nystagmus with optokinetic drum, pt kept blinking.  Pupils are equal, round, and reactive to light.  III,IV, VI: EOMI without ptosis or diploplia.  V: Facial sensation is symmetric to temperature VII: Facial movement is symmetric.  VIII: hearing is intact to voice X: Uvula elevates symmetrically XI: Shoulder shrug is symmetric. XII: tongue is midline without atrophy or fasciculations.  Motor: Tone is  normal. Bulk is normal. 5/5 strength was present in all four extremities ( right leg movement limited by pain) Sensory: Sensation is symmetric to light touch and temperature in the arms and legs Deep Tendon Reflexes: 2+ and symmetric in the biceps and patellae Plantars: Toes are downgoing bilaterally.  Cerebellar: FNF and HKS are intact bilaterally      ASSESSMENT AND PLAN 31 y.o. male with medical history significant for type 1 diabetes mellitus, schizophrenia, chronic pain, GERD presents with DKA and bilateral blindness  Bilateral blindness  -  Possibly due to fluid shift the setting of DKA vs conversion disorder  - MRI brain negative for pres/occipital lobe strokes - Unlikely to be optic neuritis of bilateral CRAO, - Consult ophthalmology

## 2017-12-10 NOTE — ED Notes (Signed)
Patient transported to MRI 

## 2017-12-10 NOTE — Consult Note (Signed)
WOC Nurse wound consult note Reason for Consult: Consult requested for left foot, pt is familiar to WOC from multiple previous admissions. Wound type: Full thickness wound to left plantar heel. Measurement: .3X.8X.2cm Wound bed: dark red and dry, no fluctuance when probed. Drainage (amount, consistency, odor) small amt tan drainage, no odor Periwound: intact skin surrounding Dressing procedure/placement/frequency: Bactroban to promote moist healing, foam dressing to protect from further injury. Discussed plan of care with patient. Please re-consult if further assistance is needed.  Thank-you,  Cammie Mcgee MSN, RN, CWOCN, [place], CNS [phone]

## 2017-12-10 NOTE — ED Notes (Signed)
Delay in starting pt on insulin drip. Pt only has one PIV that is not compatible with lactated ringer infusion.

## 2017-12-10 NOTE — ED Notes (Signed)
Portable xray at bedside.

## 2017-12-10 NOTE — H&P (Signed)
History and Physical    [name] KXF:[id number] DOB: 23-Feb-1986 DOA: 12/09/2017  PCP: Fleet Contras, MD   Patient coming from: Home  Chief Complaint: Vision-loss, chest and epigastric pain   HPI: [name] is a 31 y.o. male with medical history significant for type 1 diabetes mellitus, schizophrenia, chronic pain, GERD, and nonadherence with his treatment plan, now presenting to the emergency department for evaluation of acute vision loss, chest pain, and epigastric pain.  Patient reports that he went to use the restroom today and was unable to see.  Vision loss involves both eyes, he reports that he is able to see light and dark, as well as make out some shapes.  Denies associated headache or eye pain, and denies any recent fall or trauma.  He also describes pain in the epigastrium and lower chest similar to pain he has had before.  No alleviating or exacerbating factors identified.  Partially relieved with Dilaudid in the ED.  Denies fevers or chills.  ED Course: Upon arrival to the ED, patient is found to be slightly tachycardic, mildly tachypneic, and with stable blood pressure.  EKG features a sinus tachycardia with rate 112 and nonspecific ST abnormality.  Mr. panel is notable for a glucose of 510, sodium 130, potassium 6.1, bicarbonate undetectably low, and creatinine of 1.60, up from 0.80 less than a week earlier.  CBC is notable for a leukocytosis to 21,900, chronic anemia with hemoglobin of 11.9, and thrombocytosis with platelets 570,000.  ABG was performed with pH 6.89, pCO2 undetectably low, and pO2 149.  He was given 2 L of lactated Ringer's, 1 L of normal saline, 40 mg IV Protonix, 1 ampoule of bicarbonate, Dilaudid, started on insulin infusion in the ED.  Critical care was consulted for admission, evaluated the case, and recommends a hospitalist admission to the stepdown unit.  Neurology was consulted with regard to vision loss and recommended an MRI brain.  Patient  remains slightly tachycardic and tachypneic with stable blood pressure and will be admitted to the stepdown unit for ongoing evaluation and management of DKA with acute vision loss and pain in the epigastrium/chest.  Review of Systems:  All other systems reviewed and apart from HPI, are negative.  Past Medical History:  Diagnosis Date  . Anemia   . Anxiety   . Arthritis    "both hips; both shoulders" (11/05/2017)  . Chicken pox   . Childhood asthma   . Chronic pain   . Depression   . DKA (diabetic ketoacidoses) ([name]) 11/18/2014  . DKA (diabetic ketoacidoses) ([name]) 07/05/2017  . GERD (gastroesophageal reflux disease)   . Hypertension   . Migraine    "a few/year" (06/06/2017)  . Noncompliance with medication regimen   . Pneumonia    "several times" (06/06/2017)  . Polysubstance abuse ([name])   . Schizo affective schizophrenia ([name])   . Scoliosis   . Type I diabetes mellitus ([name])     Past Surgical History:  Procedure Laterality Date  . CARDIAC CATHETERIZATION  11/2016  . INCISION AND DRAINAGE ABSCESS Left 11/2011   "MRSA removed off my thumb"     reports that he has been smoking cigarettes.  He has a 4.50 pack-year smoking history. he has never used smokeless tobacco. He reports that he uses drugs. Drug: Marijuana. He reports that he does not drink alcohol.  Allergies  Allergen Reactions  . Sulfa Antibiotics Other (See Comments)    Unknown childhood allergy  . Fentanyl Other (See Comments)  Patient had hallucinations with the medication    Family History  Problem Relation Age of Onset  . Diabetes Mother      Prior to Admission medications   Medication Sig Start Date End Date Taking? Authorizing Provider  amphetamine-dextroamphetamine (ADDERALL) 30 MG tablet Take 30 mg daily by mouth. 10/02/17   [provider]  doxepin (SINEQUAN) 10 MG/ML solution Take 10 mLs by mouth at bedtime. 07/11/17   [provider]  furosemide (LASIX) 20 MG tablet Take 1  tablet (20 mg total) by mouth daily. 12/05/17   Mikhail, Nita Sells, DO  insulin aspart (NOVOLOG FLEXPEN) 100 UNIT/ML FlexPen Inject 0-15 Units into the skin See admin instructions. Check Blood Sugar 4 times per day > with meals and at bedtime CBG 70 - 120: 0 units CBG 121 - 150: 2 units CBG 151 - 200: 3 units CBG 201 - 250: 5 units CBG 251 - 300: 8 units CBG 301 - 350: 11 units CBG 351 - 400: 15 units 09/15/17   Rai, Ripudeep K, MD  insulin detemir (LEVEMIR) 100 UNIT/ML injection Inject 0.3 mLs (30 Units total) into the skin daily. 09/15/17   Rai, Delene Ruffini, MD  metoCLOPramide (REGLAN) 10 MG tablet Take 1 tablet (10 mg total) by mouth 3 (three) times daily as needed for nausea or vomiting. Patient not taking: Reported on 12/03/2017 09/15/17   Rai, Delene Ruffini, MD  nicotine (NICODERM CQ - DOSED IN MG/24 HOURS) 14 mg/24hr patch Place 1 patch (14 mg total) onto the skin daily. 12/06/17   Mikhail, Nita Sells, DO  pantoprazole (PROTONIX) 40 MG tablet Take 1 tablet (40 mg total) by mouth 2 (two) times daily before a meal. Patient not taking: Reported on 12/03/2017 10/13/17   Alberteen Sam, MD  sennosides-docusate sodium (SENOKOT-S) 8.6-50 MG tablet Take 1 tablet by mouth as needed for constipation.    [provider]  traZODone (DESYREL) 150 MG tablet Take 150 mg by mouth at bedtime. 06/26/17   [provider]    Physical Exam: Vitals:   12/09/17 2330 12/09/17 2345 12/10/17 0000 12/10/17 0015  BP: 132/71 120/74 116/80 (!) 113/48  Pulse:  67 (!) 103 (!) 107  Resp: (!) 21 13 17 20   SpO2:  100% 100% 100%      Constitutional: Not in acute respiratory distress, in obvious discomfort, chronically-ill appearing Eyes: PERTLA, lids and conjunctivae normal ENMT: Mucous membranes are dry. Posterior pharynx clear of any exudate or lesions.   Neck: normal, supple, no masses, no thyromegaly Respiratory: clear to auscultation bilaterally, no wheezing, no crackles. Tachypneic. No  accessory muscle use.  Cardiovascular: Rate ~110 and regular. No significant JVD. Abdomen: No distension, no tenderness, no masses palpated. Bowel sounds active.  Musculoskeletal: no clubbing / cyanosis. No joint deformity upper and lower extremities.  Skin: no significant rashes, lesions, ulcers. Pale. Neurologic: Gross vision deficit; no facial asymmetry. Sensation intact. Strength 5/5 in all 4 limbs.  Psychiatric: Alert and oriented x 3. Anxious, intermittently agitated.     Labs on Admission: I have personally reviewed following labs and imaging studies  CBC: Recent Labs  Lab 12/03/17 0228 12/03/17 0607 12/04/17 0336 12/05/17 0432 12/09/17 2233  WBC 22.3* 15.8* 8.7 4.3 21.9*  NEUTROABS  --   --   --   --  18.2*  HGB 10.1* 10.6* 8.9* 8.7* 11.9*  HCT 30.1* 29.9* 25.2* 26.2* 36.2*  MCV 87.5 85.9 84.6 86.8 92.1  PLT 405* 334 239 215 570*   Basic Metabolic Panel: Recent  Labs  Lab 12/03/17 0228 12/03/17 0607  12/03/17 1435 12/03/17 1831 12/04/17 0336 12/04/17 1451 12/05/17 0432 12/09/17 2233  NA 138 142   < > 140 142 139  --  140 130*  K 3.5 3.4*   < > 3.3* 2.8* 3.3*  --  3.2* 6.1*  CL 112* 117*   < > 116* 116* 109  --  106 104  CO2 <7* 12*   < > 18* 22 20*  --  26 <7*  GLUCOSE 371* 214*   < > 221* 118* 330*  --  130* 510*  BUN 33* 29*   < > 21* 16 11  --  6 22*  CREATININE 2.31* 1.72*   < > 1.01 0.87 1.06  --  0.80 1.60*  CALCIUM 8.0* 8.2*   < > 7.7* 7.9* 7.9*  --  7.9* 8.5*  MG 1.8 1.8  --   --   --  1.8 2.2 1.9  --   PHOS 1.7* <1.0*  --   --   --  1.6* 2.0* 3.0  --    < > = values in this interval not displayed.   GFR: Estimated Creatinine Clearance: 65.3 mL/min (A) (by C-G formula based on SCr of 1.6 mg/dL (H)). Liver Function Tests: Recent Labs  Lab 12/09/17 2233  AST 17  ALT 18  ALKPHOS 114  BILITOT 2.4*  PROT 6.4*  ALBUMIN 3.9   No results for input(s): LIPASE, AMYLASE in the last 168 hours. No results for input(s): AMMONIA in the last 168  hours. Coagulation Profile: No results for input(s): INR, PROTIME in the last 168 hours. Cardiac Enzymes: No results for input(s): CKTOTAL, CKMB, CKMBINDEX, TROPONINI in the last 168 hours. BNP (last 3 results) No results for input(s): PROBNP in the last 8760 hours. HbA1C: No results for input(s): HGBA1C in the last 72 hours. CBG: Recent Labs  Lab 12/05/17 0348 12/05/17 0821 12/05/17 1144 12/09/17 2150 12/10/17 0106  GLUCAP 77 147* 244* 445* 405*   Lipid Profile: No results for input(s): CHOL, HDL, LDLCALC, TRIG, CHOLHDL, LDLDIRECT in the last 72 hours. Thyroid Function Tests: No results for input(s): TSH, T4TOTAL, FREET4, T3FREE, THYROIDAB in the last 72 hours. Anemia Panel: No results for input(s): VITAMINB12, FOLATE, FERRITIN, TIBC, IRON, RETICCTPCT in the last 72 hours. Urine analysis:    Component Value Date/Time   COLORURINE STRAW (A) 12/03/2017 0213   APPEARANCEUR CLEAR 12/03/2017 0213   LABSPEC 1.012 12/03/2017 0213   PHURINE 5.0 12/03/2017 0213   GLUCOSEU >=500 (A) 12/03/2017 0213   HGBUR SMALL (A) 12/03/2017 0213   BILIRUBINUR NEGATIVE 12/03/2017 0213   BILIRUBINUR negative 03/29/2015 1114   KETONESUR 80 (A) 12/03/2017 0213   PROTEINUR NEGATIVE 12/03/2017 0213   UROBILINOGEN 0.2 05/17/2015 1810   NITRITE NEGATIVE 12/03/2017 0213   LEUKOCYTESUR NEGATIVE 12/03/2017 0213   Sepsis Labs: @LABRCNTIP (procalcitonin:4,lacticidven:4) ) Recent Results (from the past 240 hour(s))  Culture, blood (routine x 2)     Status: None   Collection Time: 12/02/17  8:25 PM  Result Value Ref Range Status   Specimen Description BLOOD LEFT HAND FINGER  Final   Special Requests IN PEDIATRIC BOTTLE Blood Culture adequate volume  Final   Culture NO GROWTH 5 DAYS  Final   Report Status 12/07/2017 FINAL  Final  Culture, blood (routine x 2)     Status: None   Collection Time: 12/02/17  8:40 PM  Result Value Ref Range Status   Specimen Description BLOOD RIGHT HAND  Final  Special  Requests IN PEDIATRIC BOTTLE Blood Culture adequate volume  Final   Culture NO GROWTH 5 DAYS  Final   Report Status 12/07/2017 FINAL  Final  MRSA PCR Screening     Status: None   Collection Time: 12/02/17  9:41 PM  Result Value Ref Range Status   MRSA by PCR NEGATIVE NEGATIVE Final    Comment:        The GeneXpert MRSA Assay (FDA approved for NASAL specimens only), is one component of a comprehensive MRSA colonization surveillance program. It is not intended to diagnose MRSA infection nor to guide or monitor treatment for MRSA infections.      Radiological Exams on Admission: No results found.  EKG: Independently reviewed. Sinus tachycardia (raet 112), non-specific ST-abnormality.   Assessment/Plan  1. DKA, type I DM  - Pt presents with vision-loss and pain complaints, found to be in DKA with pH 6.89  - Likely secondary to non-adherence, though complains of chest pain and acute vision-loss as addressed below  - Fluid-resuscitated in ED with 2 liters LR and 1 L NS, treated with an ampule of bicarbonate, and started on insulin infusion  - Continue IVF, continue insulin infusion with frequent CBG's and serial chem panels, continue bicarbonate therapy until pH improves    2. Vision loss  - No trauma, headache, or other focal neurologic complaints  - Neurology consulting and much appreciated  - MRI brain is pending    3. Acute kidney injury; hyperkalemia  - SCr is 1.60 on admission, up from 0.80 a few days earlier  - Likely a prerenal azotemia in setting of DKA with marked volume contraction  - He was fluid-resuscitated in ED and continued on IVF   - Avoid nephrotoxins, following serial chem panels as above   4. Chest pain  - Chronic complaint per chart review  - Continue cardiac monitoring, check troponin, repeat EKG prn, continue supportive care   5. Anemia - Hgb is 11.9 on admission, higher than priors in setting of hypovolemia  - No bleeding evident    6. Chronic  pain  - Pt complains of pain in epigastrium and chest  - Treated with Protonix and Dilaudid in ED  - Minimize IV narcotic, continue Protonix, use prn APAP and Norco    7. Schizophrenia  - Continue doxepin and trazodone    DVT prophylaxis: sq heparin  Code Status: Full  Family Communication: Discussed with patient Disposition Plan: Admit to SDU Consults called: Neurology; case discussed with PCCM Admission status: Inpatient    Briscoe Deutscher, MD [facility] Pager [phone]  If 7PM-7AM, please contact night-coverage [email or web address] Password [email or web address]  12/10/2017, 1:09 AM

## 2017-12-10 NOTE — ED Notes (Signed)
This RN and RN on 4E to start insulin drip together after pt completes MRI.

## 2017-12-10 NOTE — Progress Notes (Signed)
CRITICAL VALUE ALERT  Critical Value:  Troponin 0.03  Date & Time Notied:  12.24/18, 5pm  Provider Notified: Dr. Isidoro Donning  Orders Received/Actions taken: new orders as needed

## 2017-12-10 NOTE — Progress Notes (Signed)
Inpatient Diabetes Program Recommendations  AACE/ADA: New Consensus Statement on Inpatient Glycemic Control (2015)  Target Ranges:  Prepandial:   less than 140 mg/dL      Peak postprandial:   less than 180 mg/dL (1-2 hours)      Critically ill patients:  140 - 180 mg/dL   Results for [name], [name] (MRN [id number]) as of 12/10/2017 09:09  Ref. Range 12/10/2017 01:06 12/10/2017 03:46 12/10/2017 04:49 12/10/2017 05:38 12/10/2017 06:48 12/10/2017 07:45 12/10/2017 09:14  Glucose-Capillary Latest Ref Range: 65 - 99 mg/dL 086 (H) 578 (H) 469 (H) 171 (H) 142 (H) 127 (H) 119 (H)  Results for [name], [name] (MRN [id number]) as of 12/10/2017 09:09  Ref. Range 12/09/2017 22:33  Glucose Latest Ref Range: 65 - 99 mg/dL 244 (HH)   Review of Glycemic Control  Diabetes history: DM1 (makes no insulin; will require basal, correction, and meal coverage insulin) Outpatient Diabetes medications: Levemir 30 units daily, Novolog 0-15 units QID Current orders for Inpatient glycemic control: IV insulin drip  Inpatient Diabetes Program Recommendations:  Insulin - Basal: Once acidosis is cleared and MD is ready to transition off IV insulin to SQ insulin, please consider ordering Levemir 12 units BID. Correction (SSI): Once acidosis is cleared and MD is ready to transition off IV insulin to SQ insulin, please consider ordering CBGs with Novolog 0-9 units TID with meals and Novolog 0-5 units QHS. Insulin - Meal Coverage: Once acidosis is cleared and MD is ready to transition off IV insulin to SQ insulin, please consider ordering Novolog 3 units TID with meals for meal coverage if patient eats at least 50% of meals.  NOTE: Noted consult for Diabetes Coordinator. Chart reviewed.  Diabetes Coordinator is not on campus but available for questions or concerns by pager from 8am to 5pm. Patient is well known to Inpatient Diabetes team as he has Type 1 DM and this is his 14th admission in the past 6 months. Since patient  has Type 1 DM, he will require basal, correction, and meal coverage insulin as he makes no insulin.   Thanks, Orlando Penner, RN, MSN, CDE Diabetes Coordinator Inpatient Diabetes Program [phone] (Team Pager from 8am to 5pm)

## 2017-12-10 NOTE — ED Notes (Signed)
Insulin not compatible with LR. Pt refusing second PIV by IV team.  Dr. Antionette Char placing order for one time dose of IV insulin and switching LR to NS. Dr. Antionette Char states we can hold off on placing pt on glucostabilizer until after MRI. MRI consulted and states they will come for pt in 30 minutes and imaging will take 20 minutes.

## 2017-12-11 DIAGNOSIS — M25551 Pain in right hip: Secondary | ICD-10-CM

## 2017-12-11 DIAGNOSIS — E1011 Type 1 diabetes mellitus with ketoacidosis with coma: Secondary | ICD-10-CM

## 2017-12-11 DIAGNOSIS — M25552 Pain in left hip: Secondary | ICD-10-CM

## 2017-12-11 LAB — CBC
HEMATOCRIT: 24.9 % — AB (ref 39.0–52.0)
HEMOGLOBIN: 8.8 g/dL — AB (ref 13.0–17.0)
MCH: 30.1 pg (ref 26.0–34.0)
MCHC: 35.3 g/dL (ref 30.0–36.0)
MCV: 85.3 fL (ref 78.0–100.0)
Platelets: 249 10*3/uL (ref 150–400)
RBC: 2.92 MIL/uL — AB (ref 4.22–5.81)
RDW: 14.4 % (ref 11.5–15.5)
WBC: 4.6 10*3/uL (ref 4.0–10.5)

## 2017-12-11 LAB — GLUCOSE, CAPILLARY
GLUCOSE-CAPILLARY: 259 mg/dL — AB (ref 65–99)
GLUCOSE-CAPILLARY: 82 mg/dL (ref 65–99)
GLUCOSE-CAPILLARY: 81 mg/dL (ref 65–99)
GLUCOSE-CAPILLARY: 204 mg/dL — AB (ref 65–99)
Glucose-Capillary: 194 mg/dL — ABNORMAL HIGH (ref 65–99)

## 2017-12-11 LAB — BASIC METABOLIC PANEL
ANION GAP: 5 (ref 5–15)
BUN: 8 mg/dL (ref 6–20)
CHLORIDE: 110 mmol/L (ref 101–111)
CO2: 22 mmol/L (ref 22–32)
Calcium: 8.2 mg/dL — ABNORMAL LOW (ref 8.9–10.3)
Creatinine, Ser: 0.81 mg/dL (ref 0.61–1.24)
GFR calc Af Amer: >60 mL/min (ref >60)
GLUCOSE: 162 mg/dL — AB (ref 65–99)
POTASSIUM: 3.1 mmol/L — AB (ref 3.5–5.1)
Sodium: 137 mmol/L (ref 135–145)

## 2017-12-11 MED ORDER — INSULIN DETEMIR 100 UNIT/ML ~~LOC~~ SOLN
12.0000 [IU] | Freq: Two times a day (BID) | SUBCUTANEOUS | Status: DC
  Administered 2017-12-11 – 2017-12-12 (×3): 12 [IU] via SUBCUTANEOUS
  Filled 2017-12-11 (×3): qty 0.12

## 2017-12-11 MED ORDER — POTASSIUM CHLORIDE CRYS ER 20 MEQ PO TBCR: 40.0000 meq | EXTENDED_RELEASE_TABLET | Freq: Once | ORAL | Status: DC

## 2017-12-11 NOTE — Progress Notes (Signed)
Patient refuses his morning lab, and his morning blood sugar check. Educated on the need and necessity to get his blood sugar check and patient get irritated and agitated. Patient became very agitated, combative, belligerent, verbally and physically abusive, and threatening to the staff.

## 2017-12-11 NOTE — Progress Notes (Signed)
Pt called my self into the room to inform me that he would not be doing any vitals, lab sticks, or blood sugars and that I could call the doctor to tell jim I explained to the pt the need and nesscity of the care he was still refusing and stated  " I can refuse medical care".

## 2017-12-11 NOTE — Progress Notes (Signed)
[facility]                                                                              Patient Demographics  [name], is a 31 y.o. male, DOB - March 23, 1986, PTW:[id number]  Admit date - 12/09/2017   Admitting Physician Briscoe Deutscher, MD  Outpatient Primary MD for the patient is Fleet Contras, MD  Outpatient specialists:   LOS - 1  days   Medical records reviewed and are as summarized below:    Chief Complaint  Patient presents with  . Abdominal Pain  . Hyperglycemia       Brief summary   Patient is a 31 year old male with type 1 diabetes, schizophrenia, chronic pain, GERD, severe medical noncompliance presented with acute vision loss, chest pain, epigastric pain. In ED, patient was found to have glucose of 510, sodium 130, potassium 6.1, bicarb undetectable low, creatinine 1.6, leukocytosis with WBC count 21.9, chronic anemia.  ABG showed pH of 6.89, PCO2 undetectably low, PO2 149.  Critical care was consulted for admission and recommended hospitalist admission to stepdown unit.  Patient was admitted for management of DKA and acute vision loss.   Assessment & Plan    Principal Problem:   DKA, type 1, not at goal [facility]), severely noncompliant, high anion gap metabolic acidosis with bicarb <7 -PH 6.89, anion gap >20 on admission, glucose 510, creatinine 1.6, potassium 6.1 -Patient was started on IV fluid hydration, IV insulin drip -Bicarb was persistently low <7, placed on bicarb drip, bicarb has improved at 22, bicarb drip is off -Severely noncompliant with his treatment plan, 14 admissions in the last 6 months, just discharged on 12/19. -Transition to subcutaneous insulin, hemoglobin A1c 8.1, continue Lantus 12 units twice daily, NovoLog 3 units 3 times daily AC, meal coverage sliding scale insulin -Strongly counseled compliance with his treatment plan   Active Problems: Acute Vision loss bilateral -No focal neurological deficits, MRI of the  brain negative for any stroke. Evaluated by neurology, possibly due to severe DKA and ophthalmic issue versus conversion disorder.   -Patient reports that vision is improving somewhat today, able to see shapes.  Consulted ophthalmology, discussed with Dr Laruth Bouchard, will evaluate today.    Chest pain atypical, also has history of chronic pain - Troponins negative, minimize IV narcotic use, asking for pain medications    Hyperkalemia -Secondary to DKA, improved with insulin drip, today hypokalemia, 3.1, replaced    AKI (acute kidney injury) ([name]) -Presented with creatinine of 1.6, creatinine was 0.8 on 12/19 -Patient was placed on a aggressive IV fluid hydration, creatinine improved to baseline 0.8    Normocytic anemia -Hemoglobin 11.9 on admission, unknown baseline, no bleeding -8.8 today likely due to hemodilution  Schizophrenia -Evaluated by psychiatry during the last admission, continue doxepin, trazodone   Code Status: full  DVT Prophylaxis: Heparin subcu Family Communication: Discussed in detail with the patient, all imaging results, lab results explained to the patient    Disposition Plan: Hopefully will DC in 24 hours after ophthalmology evaluation  Time Spent in minutes   25 minutes  Procedures:  None  Consultants:   P CCM  was consulted in ED Neurology Ophthalmology  Antimicrobials:      Medications  Scheduled Meds: . doxepin  100 mg Oral QHS  . enoxaparin (LOVENOX) injection  30 mg Subcutaneous Q24H  . GLUCERNA  237 mL Oral TID BM  . insulin aspart  0-5 Units Subcutaneous QHS  . insulin aspart  0-9 Units Subcutaneous TID WC  . insulin aspart  3 Units Subcutaneous TID WC  . insulin detemir  12 Units Subcutaneous BID  . mupirocin cream   Topical Daily  . nicotine  14 mg Transdermal Daily  . pantoprazole (PROTONIX) IV  40 mg Intravenous Q24H  . traZODone  150 mg Oral QHS   Continuous Infusions: . sodium chloride 100 mL/hr at 12/11/17 0520  . dextrose 5 %  and 0.45% NaCl 100 mL/hr at 12/10/17 0640   PRN Meds:.acetaminophen, HYDROcodone-acetaminophen, senna-docusate   Antibiotics   Anti-infectives (From admission, onward)   None        Subjective:   [name] was seen and examined today.  Alert and awake, oriented today.  States vision is improving today can follow and see shapes.  Asking for pain medications.  Denies any nausea, vomiting, chest pain, shortness of breath, dizziness, lightheadedness or any new weakness.  No fevers.   Objective:   Vitals:   12/10/17 1619 12/10/17 2015 12/11/17 0027 12/11/17 0443  BP: 121/78 115/75 130/85 95/62  Pulse:  (!) 110 (!) 109 (!) 101  Resp: 14 (!) 24 20 (!) 9  Temp: 98.1 F (36.7 C) 98.2 F (36.8 C) 98.3 F (36.8 C) (!) 97.2 F (36.2 C)  TempSrc: Oral Oral Oral Oral  SpO2: 98% 100% 100% 100%  Weight:      Height:        Intake/Output Summary (Last 24 hours) at 12/11/2017 1222 Last data filed at 12/11/2017 0800 Gross per 24 hour  Intake 1613.34 ml  Output 325 ml  Net 1288.34 ml     Wt Readings from Last 3 Encounters:  12/10/17 64.3 kg (141 lb 12.1 oz)  12/04/17 70.3 kg (155 lb)  11/03/17 64.6 kg (142 lb 6.7 oz)     Exam  General: Alert and oriented x 3, NAD  Eyes: PERRLA, EOMI, states can see shapes, no nystagmus  HEENT:    Cardiovascular: S1 S2 auscultated, no rubs, murmurs or gallops. Regular rate and rhythm. No pedal edema b/l  Respiratory: Clear to auscultation bilaterally, no wheezing, rales or rhonchi  Gastrointestinal: Soft, nontender, nondistended, + bowel sounds  Ext: no pedal edema bilaterally  Neuro: no neurological deficits  Musculoskeletal: No digital cyanosis, clubbing  Skin: No rashes  Psych: Normal affect and demeanor, alert and oriented x3    Data Reviewed:  I have personally reviewed following labs and imaging studies  Micro Results Recent Results (from the past 240 hour(s))  Culture, blood (routine x 2)     Status: None    Collection Time: 12/02/17  8:25 PM  Result Value Ref Range Status   Specimen Description BLOOD LEFT HAND FINGER  Final   Special Requests IN PEDIATRIC BOTTLE Blood Culture adequate volume  Final   Culture NO GROWTH 5 DAYS  Final   Report Status 12/07/2017 FINAL  Final  Culture, blood (routine x 2)     Status: None   Collection Time: 12/02/17  8:40 PM  Result Value Ref Range Status   Specimen Description BLOOD RIGHT HAND  Final   Special Requests IN PEDIATRIC BOTTLE Blood Culture adequate volume  Final  Culture NO GROWTH 5 DAYS  Final   Report Status 12/07/2017 FINAL  Final  MRSA PCR Screening     Status: None   Collection Time: 12/02/17  9:41 PM  Result Value Ref Range Status   MRSA by PCR NEGATIVE NEGATIVE Final    Comment:        The GeneXpert MRSA Assay (FDA approved for NASAL specimens only), is one component of a comprehensive MRSA colonization surveillance program. It is not intended to diagnose MRSA infection nor to guide or monitor treatment for MRSA infections.     Radiology Reports Mr Brain Wo Contrast  Result Date: 12/10/2017 CLINICAL DATA:  New onset bilateral blindness. EXAM: MRI HEAD WITHOUT CONTRAST TECHNIQUE: Multiplanar, multiecho pulse sequences of the brain and surrounding structures were obtained without intravenous contrast. COMPARISON:  Head CT 10/20/2017 FINDINGS: Brain: The midline structures are normal. There is no acute infarct or acute hemorrhage. No mass lesion, hydrocephalus, dural abnormality or extra-axial collection. The brain parenchymal signal is normal. No age-advanced or lobar predominant atrophy. No chronic microhemorrhage or superficial siderosis. Probable developmental venous anomaly in the left parietal lobe. Vascular: Major intracranial arterial and venous sinus flow voids are preserved. Skull and upper cervical spine: The visualized skull base, calvarium, upper cervical spine and extracranial soft tissues are normal. Sinuses/Orbits: No  fluid levels or advanced mucosal thickening. No mastoid or middle ear effusion. Normal orbits. IMPRESSION: Normal MRI of the brain. Electronically Signed   By: Deatra RobinsonKevin  Herman M.D.   On: 12/10/2017 03:10   Dg Chest Port 1 View  Result Date: 12/10/2017 CLINICAL DATA:  Acute onset of shortness of breath and generalized chest pain. EXAM: PORTABLE CHEST 1 VIEW COMPARISON:  Chest radiograph performed 12/02/2017 FINDINGS: The lungs are well-aerated and clear. There is no evidence of focal opacification, pleural effusion or pneumothorax. The left costophrenic angle is incompletely imaged on this study. The cardiomediastinal silhouette is within normal limits. No acute osseous abnormalities are seen. IMPRESSION: No acute cardiopulmonary process seen. Electronically Signed   By: Roanna RaiderJeffery  Chang M.D.   On: 12/10/2017 01:24   Dg Chest Portable 1 View  Result Date: 12/02/2017 CLINICAL DATA:  Hyperglycemia.  Hypotension. EXAM: PORTABLE CHEST 1 VIEW COMPARISON:  November 03, 2017 FINDINGS: There is no edema or consolidation. The heart size and pulmonary vascularity are normal. No adenopathy. No evident bone lesions. IMPRESSION: No edema or consolidation. Electronically Signed   By: Bretta BangWilliam  Woodruff III M.D.   On: 12/02/2017 19:57    Lab Data:  CBC: Recent Labs  Lab 12/05/17 0432 12/09/17 2233 12/11/17 0929  WBC 4.3 21.9* 4.6  NEUTROABS  --  18.2*  --   HGB 8.7* 11.9* 8.8*  HCT 26.2* 36.2* 24.9*  MCV 86.8 92.1 85.3  PLT 215 570* 249   Basic Metabolic Panel: Recent Labs  Lab 12/04/17 1451 12/05/17 0432 12/09/17 2233 12/10/17 0305 12/10/17 1447 12/11/17 0345  NA  --  140 130* 136 137 137  K  --  3.2* 6.1* 4.7 3.6 3.1*  CL  --  106 104 111 112* 110  CO2  --  26 <7* <7* 16* 22  GLUCOSE  --  130* 510* 306* 104* 162*  BUN  --  6 22* 24* 15 8  CREATININE  --  0.80 1.60* 1.44* 1.14 0.81  CALCIUM  --  7.9* 8.5* 8.4* 8.3* 8.2*  MG 2.2 1.9  --   --   --   --   PHOS 2.0* 3.0  --   --   --   --  GFR: Estimated Creatinine Clearance: 118 mL/min (by C-G formula based on SCr of 0.81 mg/dL). Liver Function Tests: Recent Labs  Lab 12/09/17 2233  AST 17  ALT 18  ALKPHOS 114  BILITOT 2.4*  PROT 6.4*  ALBUMIN 3.9   No results for input(s): LIPASE, AMYLASE in the last 168 hours. No results for input(s): AMMONIA in the last 168 hours. Coagulation Profile: No results for input(s): INR, PROTIME in the last 168 hours. Cardiac Enzymes: Recent Labs  Lab 12/10/17 0305 12/10/17 1447  TROPONINI <0.03 0.03*   BNP (last 3 results) No results for input(s): PROBNP in the last 8760 hours. HbA1C: Recent Labs    12/10/17 1447  HGBA1C 8.1*   CBG: Recent Labs  Lab 12/10/17 1406 12/10/17 1616 12/10/17 2042 12/11/17 0922 12/11/17 1125  GLUCAP 95 157* 224* 259* 194*   Lipid Profile: No results for input(s): CHOL, HDL, LDLCALC, TRIG, CHOLHDL, LDLDIRECT in the last 72 hours. Thyroid Function Tests: No results for input(s): TSH, T4TOTAL, FREET4, T3FREE, THYROIDAB in the last 72 hours. Anemia Panel: No results for input(s): VITAMINB12, FOLATE, FERRITIN, TIBC, IRON, RETICCTPCT in the last 72 hours. Urine analysis:    Component Value Date/Time   COLORURINE STRAW (A) 12/10/2017 0133   APPEARANCEUR CLEAR 12/10/2017 0133   LABSPEC 1.011 12/10/2017 0133   PHURINE 5.0 12/10/2017 0133   GLUCOSEU >=500 (A) 12/10/2017 0133   HGBUR SMALL (A) 12/10/2017 0133   BILIRUBINUR NEGATIVE 12/10/2017 0133   BILIRUBINUR negative 03/29/2015 1114   KETONESUR 80 (A) 12/10/2017 0133   PROTEINUR 30 (A) 12/10/2017 0133   UROBILINOGEN 0.2 05/17/2015 1810   NITRITE NEGATIVE 12/10/2017 0133   LEUKOCYTESUR NEGATIVE 12/10/2017 0133     Emmi Wertheim M.D. [facility] 12/11/2017, 12:22 PM  Pager: [phone] Between 7am to 7pm - call Pager - [phone]  After 7pm go to [email or web address] - password [email or web address]  Call night coverage person covering after 7pm

## 2017-12-11 NOTE — Progress Notes (Signed)
Pt is also refusing to wear tele encourage pt continues to refuse tele

## 2017-12-11 NOTE — Consult Note (Addendum)
Ophthalmology Initial Consult Note  [name], [name], 31 y.o. male Date of Service:  12/11/2017  Requesting physician: Cathren Harsh, MD  Information Obtained from: chart, patient Chief Complaint:  Blurry vision  HPI/Discussion:  [name] is a 31 y.o. male who presents to [facility] in DKA and with complaint of blurry vision OU. Ophthalmology was consulted to evaluate the patient. Last eye exam was 3-4 years ago. He denies any previous eye problems.  Past Ocular Hx:  Denies Ocular Meds:  None Family ocular history: Noncontributory  Past Medical History:  Diagnosis Date  . Anemia   . Anxiety   . Arthritis    "both hips; both shoulders" (11/05/2017)  . Chicken pox   . Childhood asthma   . Chronic pain   . Depression   . DKA (diabetic ketoacidoses) ([name]) 11/18/2014  . DKA (diabetic ketoacidoses) ([name]) 07/05/2017  . GERD (gastroesophageal reflux disease)   . Hypertension   . Migraine    "a few/year" (06/06/2017)  . Noncompliance with medication regimen   . Pneumonia    "several times" (06/06/2017)  . Polysubstance abuse ([name])   . Schizo affective schizophrenia ([name])   . Scoliosis   . Type I diabetes mellitus ([name])    Past Surgical History:  Procedure Laterality Date  . CARDIAC CATHETERIZATION  11/2016  . INCISION AND DRAINAGE ABSCESS Left 11/2011   "MRSA removed off my thumb"    Prior to Admission Meds: Medications Prior to Admission  Medication Sig Dispense Refill Last Dose  . amphetamine-dextroamphetamine (ADDERALL) 30 MG tablet Take 30 mg daily by mouth.   LF 11-28-17 at 30DS  . doxepin (SINEQUAN) 10 MG/ML solution Take 10 mLs by mouth at bedtime.  2 LF 11-22-17 at 30DS  . furosemide (LASIX) 20 MG tablet Take 1 tablet (20 mg total) by mouth daily. 30 tablet 0   . insulin aspart (NOVOLOG FLEXPEN) 100 UNIT/ML FlexPen Inject 0-15 Units into the skin See admin instructions. Check Blood Sugar 4 times per day > with meals and at bedtime CBG 70 - 120: 0 units CBG  121 - 150: 2 units CBG 151 - 200: 3 units CBG 201 - 250: 5 units CBG 251 - 300: 8 units CBG 301 - 350: 11 units CBG 351 - 400: 15 units 10 mL 0 LF 09-11-17 at 100DS  . insulin detemir (LEVEMIR) 100 UNIT/ML injection Inject 0.3 mLs (30 Units total) into the skin daily. 10 mL 3 LF 11-21-17 at 50DS  . metoCLOPramide (REGLAN) 10 MG tablet Take 1 tablet (10 mg total) by mouth 3 (three) times daily as needed for nausea or vomiting. 30 tablet 0 Not Taking at 30DS  . nicotine (NICODERM CQ - DOSED IN MG/24 HOURS) 14 mg/24hr patch Place 1 patch (14 mg total) onto the skin daily. 28 patch 0   . pantoprazole (PROTONIX) 40 MG tablet Take 1 tablet (40 mg total) by mouth 2 (two) times daily before a meal. 60 tablet 1 Not Taking at 30DS  . sennosides-docusate sodium (SENOKOT-S) 8.6-50 MG tablet Take 1 tablet by mouth as needed for constipation.   UNK  . traZODone (DESYREL) 150 MG tablet Take 150 mg by mouth at bedtime.  0 LF 11-28-17 at 30DS    Inpatient Meds: @IPMEDS @  Allergies  Allergen Reactions  . Sulfa Antibiotics Other (See Comments)    Unknown childhood allergy  . Fentanyl Other (See Comments)    Patient had hallucinations with the medication   Social History   Tobacco Use  .  Smoking status: Current Every Day Smoker    Packs/day: 0.50    Years: 9.00    Pack years: 4.50    Types: Cigarettes  . Smokeless tobacco: Never Used  Substance Use Topics  . Alcohol use: No   Family History  Problem Relation Age of Onset  . Diabetes Mother     ROS: Other than ROS in the HPI, all other systems were negative.  Exam: Temp: (!) 97.2 F (36.2 C) Pulse Rate: (!) 101 BP: 95/62 Resp: (!) 9 SpO2: 100 %  Visual Acuity:  near   OD Count fingers at 1'   OS Count fingers at 1'      OD OS  Confr Vis Fields Grossly full to hand motion, but difficult Grossly full to hand motion, but difficult  EOM (Primary) Full Full  Lids/Lashes Normal Normal  Conjunctiva White, quiet White, quiet  Adnexa   Normal Normal  Pupils  3 --> 2.5, very sluggish, no rAPD 2.5 --> 2, very sluggish, no rAPD  Cornea  Clear Clear  Anterior Chamber Formed, grossly quiet Formed, grossly quiet  Lens:  Clear Clear  IOP 10 9  Fundus - Dilated? YES   Optic Disc 0.6, pink, no edema, no NVD 0.6, pink, no edema, no NVD  Post Seg:  Retina                    Vessels Mild attenuation Mild attenuation                  Vitreous  Clear Clear                  Macula Attached, scattered DBHs and flame hemorrhages, few small NFL infarcts Attached, scattered DBHs and flame hemorrhages, few small NFL infarcts                  Periphery Scattered DBHs, flame hemorrhages, and NFL infarcts Scattered DBHs, flame hemorrhages, and NFL infarcts       Neuro:  Oriented to person, place, and time:  Yes Psychiatric:  Mood and Affect Appropriate:  Yes  Labs/imaging:   A/P:  31 y.o. male with diabetic ketoacidosis and blurry vision OU  1) Nonproliferative diabetic retinopathy, moderate OU and 2) Hypertensive retinopathy OU - Unclear whether this is the cause of his blurry vision. - No obvious macular edema, but difficult to discern at bedside. - Could also be refractive error in context of osmotic shifts. Unable to perform pinhole visual acuity. - No acute intervention indicated. - Recommend glucose control, BP control, and outpatient follow-up/treatment as indicated.  3) Glaucoma suspect OU - Based on larger than average c/d. - Could be physiologic. Recommend outpatient evaluation.  Recommend outpatient follow up with Dr. Fabian Sharp at:  [facility] [address] [place], [place] [address] [phone]  R Fabian Sharp, MD 12/11/2017, 1:09 PM

## 2017-12-12 ENCOUNTER — Inpatient Hospital Stay (HOSPITAL_COMMUNITY): Payer: Medicare Other

## 2017-12-12 ENCOUNTER — Other Ambulatory Visit: Payer: Self-pay

## 2017-12-12 LAB — GLUCOSE, CAPILLARY
Glucose-Capillary: 301 mg/dL — ABNORMAL HIGH (ref 65–99)
Glucose-Capillary: 87 mg/dL (ref 65–99)

## 2017-12-12 MED ORDER — INSULIN DETEMIR 100 UNIT/ML ~~LOC~~ SOLN: 12.0000 [IU] | Freq: Two times a day (BID) | SUBCUTANEOUS | 3 refills | Status: DC

## 2017-12-12 MED ORDER — METOCLOPRAMIDE HCL 10 MG PO TABS: 10.0000 mg | ORAL_TABLET | Freq: Three times a day (TID) | ORAL | 0 refills | Status: DC | PRN

## 2017-12-12 MED ORDER — PANTOPRAZOLE SODIUM 40 MG PO TBEC: 40.0000 mg | DELAYED_RELEASE_TABLET | Freq: Two times a day (BID) | ORAL | 1 refills | Status: AC

## 2017-12-12 MED ORDER — INSULIN ASPART 100 UNIT/ML FLEXPEN: 0.0000 [IU] | PEN_INJECTOR | SUBCUTANEOUS | 1 refills | Status: AC

## 2017-12-12 MED ORDER — MUPIROCIN CALCIUM 2 % EX CREA: TOPICAL_CREAM | Freq: Every day | CUTANEOUS | 1 refills | Status: DC

## 2017-12-12 NOTE — Progress Notes (Signed)
CSW received verbal consult from patient RN to assist patient with transportation needs. RN stated patients friend will be unable to pick him up due to being in dialysis. CSW gave RN taxi voucher to call Taxi for patients once patient is ready. CSW signing off as patient no longer has social work needs.   Marrianne Mood, MSW,  [employer] [phone]

## 2017-12-12 NOTE — Discharge Summary (Addendum)
Physician Discharge Summary   Patient ID: [name] MRN: [id number] DOB/AGE: 19-Nov-1986 31 y.o.  Admit date: 12/09/2017 Discharge date: 12/12/2017  Primary Care Physician:  Fleet Contras, MD  Discharge Diagnoses:    . DKA, type 1, not at goal [facility]), high anion gap metabolic acidosis with bicarb less than 7 . Substance abuse ([name]) . Uncontrolled type 1 diabetes mellitus . Chest pain . Acute visual changes due to nonproliferative diabetic retinopathy, hypertensive retinopathy . AKI (acute kidney injury) ([name]) . Normocytic anemia . Hyperkalemia Severe medical noncompliance History of schizophrenia   Consults:   Neurology Ophthalmology- Dr Dione Booze  Recommendations for Outpatient Follow-up:  1. Patient strongly recommended to be compliant with his insulin and follow-up outpatient with Dr. Dione Booze 2. Please repeat CBC/BMET at next visit 3. Levemir changed to 12 units twice a day   DIET: Carb modified diet    Allergies:   Allergies  Allergen Reactions  . Sulfa Antibiotics Other (See Comments)    Unknown childhood allergy  . Fentanyl Other (See Comments)    Patient had hallucinations with the medication     DISCHARGE MEDICATIONS: Allergies as of 12/12/2017      Reactions   Sulfa Antibiotics Other (See Comments)   Unknown childhood allergy   Fentanyl Other (See Comments)   Patient had hallucinations with the medication      Medication List    STOP taking these medications   furosemide 20 MG tablet Commonly known as:  LASIX     TAKE these medications   amphetamine-dextroamphetamine 30 MG tablet Commonly known as:  ADDERALL Take 30 mg daily by mouth.   doxepin 10 MG/ML solution Commonly known as:  SINEQUAN Take 10 mLs by mouth at bedtime.   insulin aspart 100 UNIT/ML FlexPen Commonly known as:  NOVOLOG FLEXPEN Inject 0-15 Units into the skin See admin instructions. Check Blood Sugar 4 times per day > with meals and at bedtime CBG 70 - 120: 0  units CBG 121 - 150: 2 units CBG 151 - 200: 3 units CBG 201 - 250: 5 units CBG 251 - 300: 8 units CBG 301 - 350: 11 units CBG 351 - 400: 15 units   insulin detemir 100 UNIT/ML injection Commonly known as:  LEVEMIR Inject 0.12 mLs (12 Units total) into the skin 2 (two) times daily. What changed:    how much to take  when to take this   metoCLOPramide 10 MG tablet Commonly known as:  REGLAN Take 1 tablet (10 mg total) by mouth 3 (three) times daily as needed for nausea or vomiting.   mupirocin cream 2 % Commonly known as:  BACTROBAN Apply topically daily. Apply Bactroban to left heel wound Q day, then cover with foam dressing.  (Change foam dressing Q 3 days or PRN soiling.)   nicotine 14 mg/24hr patch Commonly known as:  NICODERM CQ - dosed in mg/24 hours Place 1 patch (14 mg total) onto the skin daily.   pantoprazole 40 MG tablet Commonly known as:  PROTONIX Take 1 tablet (40 mg total) by mouth 2 (two) times daily before a meal.   sennosides-docusate sodium 8.6-50 MG tablet Commonly known as:  SENOKOT-S Take 1 tablet by mouth as needed for constipation.   traZODone 150 MG tablet Commonly known as:  DESYREL Take 150 mg by mouth at bedtime.        Brief H and P: For complete details please refer to admission H and P, but in brief Patient is a 31 year old male  with type 1 diabetes, schizophrenia, chronic pain, GERD, severe medical noncompliance presented with acute vision loss, chest pain, epigastric pain. In ED, patient was found to have glucose of 510, sodium 130, potassium 6.1, bicarb undetectable low, creatinine 1.6, leukocytosis with WBC count 21.9, chronic anemia.  ABG showed pH of 6.89, PCO2 undetectably low, PO2 149.  Critical care was consulted for admission and recommended hospitalist admission to stepdown unit.  Patient was admitted for management of DKA and acute vision loss.   Hospital Course:    DKA, type 1, not at goal [facility]), severely noncompliant,  high anion gap metabolic acidosis with bicarb <7 -PH 6.89, anion gap >20 on admission, glucose 510, creatinine 1.6, potassium 6.1 -Patient was started on IV fluid hydration, IV insulin drip. Bicarb was persistently low <7, placed on bicarb drip, bicarb has improved at 22, bicarb drip was turned off.  -Severely noncompliant with his treatment plan, 14 admissions in the last 6 months, just discharged on 12/19. -Transitioned to subcutaneous insulin, hemoglobin A1c 8.1, continue Lantus 12 units twice daily, NovoLog sliding scale insulin.   -Strongly counseled compliance with his treatment plan   Acute Vision loss bilateral -No focal neurological deficits, MRI of the brain negative for any stroke. Evaluated by neurology, possibly due to severe DKA and ophthalmic issue versus conversion disorder.   -Patient was seen by ophthalmology, Dr Dione Booze and felt patient has nonproliferative diabetic retinopathy and recommended outpatient follow-up. -Per patient vision has now improved     Chest pain atypical, also has history of chronic pain - Troponins negative     Hyperkalemia -Secondary to DKA, improved with insulin drip, refuses labs    AKI (acute kidney injury) ([name]) -Presented with creatinine of 1.6, creatinine was 0.8 on 12/19 -Patient was placed on a aggressive IV fluid hydration, creatinine improved to baseline 0.8    Normocytic anemia -Hemoglobin 11.9 on admission, unknown baseline, no bleeding -8.8 on 12/25, subsequently refused labs  Schizophrenia -Evaluated by psychiatry during the last admission, continue doxepin, trazodone    Day of Discharge BP 117/75 (BP Location: Left Arm)   Pulse (!) 109   Temp (!) 97.5 F (36.4 C) (Oral)   Resp 16   Ht 6\' 1"  (1.854 m)   Wt 64.3 kg (141 lb 12.1 oz)   SpO2 98%   BMI 18.70 kg/m   Physical Exam: General: Alert and awake oriented x3 not in any acute distress. HEENT: anicteric sclera, pupils reactive to light and accommodation CVS:  S1-S2 clear no murmur rubs or gallops Chest: clear to auscultation bilaterally, no wheezing rales or rhonchi Abdomen: soft nontender, nondistended, normal bowel sounds Extremities: no cyanosis, clubbing or edema noted bilaterally Neuro: Cranial nerves II-XII intact, no focal neurological deficits   The results of significant diagnostics from this hospitalization (including imaging, microbiology, ancillary and laboratory) are listed below for reference.    LAB RESULTS: Basic Metabolic Panel: Recent Labs  Lab 12/10/17 1447 12/11/17 0345  NA 137 137  K 3.6 3.1*  CL 112* 110  CO2 16* 22  GLUCOSE 104* 162*  BUN 15 8  CREATININE 1.14 0.81  CALCIUM 8.3* 8.2*   Liver Function Tests: Recent Labs  Lab 12/09/17 2233  AST 17  ALT 18  ALKPHOS 114  BILITOT 2.4*  PROT 6.4*  ALBUMIN 3.9   No results for input(s): LIPASE, AMYLASE in the last 168 hours. No results for input(s): AMMONIA in the last 168 hours. CBC: Recent Labs  Lab 12/09/17 2233 12/11/17 0929  WBC 21.9*  4.6  NEUTROABS 18.2*  --   HGB 11.9* 8.8*  HCT 36.2* 24.9*  MCV 92.1 85.3  PLT 570* 249   Cardiac Enzymes: Recent Labs  Lab 12/10/17 0305 12/10/17 1447  TROPONINI <0.03 0.03*   BNP: Invalid input(s): POCBNP CBG: Recent Labs  Lab 12/11/17 2116 12/12/17 0614  GLUCAP 204* 87    Significant Diagnostic Studies:  Mr Brain Wo Contrast  Result Date: 12/10/2017 CLINICAL DATA:  New onset bilateral blindness. EXAM: MRI HEAD WITHOUT CONTRAST TECHNIQUE: Multiplanar, multiecho pulse sequences of the brain and surrounding structures were obtained without intravenous contrast. COMPARISON:  Head CT 10/20/2017 FINDINGS: Brain: The midline structures are normal. There is no acute infarct or acute hemorrhage. No mass lesion, hydrocephalus, dural abnormality or extra-axial collection. The brain parenchymal signal is normal. No age-advanced or lobar predominant atrophy. No chronic microhemorrhage or superficial siderosis.  Probable developmental venous anomaly in the left parietal lobe. Vascular: Major intracranial arterial and venous sinus flow voids are preserved. Skull and upper cervical spine: The visualized skull base, calvarium, upper cervical spine and extracranial soft tissues are normal. Sinuses/Orbits: No fluid levels or advanced mucosal thickening. No mastoid or middle ear effusion. Normal orbits. IMPRESSION: Normal MRI of the brain. Electronically Signed   By: Deatra Robinson M.D.   On: 12/10/2017 03:10   Dg Chest Port 1 View  Result Date: 12/10/2017 CLINICAL DATA:  Acute onset of shortness of breath and generalized chest pain. EXAM: PORTABLE CHEST 1 VIEW COMPARISON:  Chest radiograph performed 12/02/2017 FINDINGS: The lungs are well-aerated and clear. There is no evidence of focal opacification, pleural effusion or pneumothorax. The left costophrenic angle is incompletely imaged on this study. The cardiomediastinal silhouette is within normal limits. No acute osseous abnormalities are seen. IMPRESSION: No acute cardiopulmonary process seen. Electronically Signed   By: Roanna Raider M.D.   On: 12/10/2017 01:24    2D ECHO:   Disposition and Follow-up: Discharge Instructions    Diet Carb Modified   Complete by:  As directed    Discharge instructions   Complete by:  As directed    It is VERY IMPORTANT that you follow up with a PCP on a regular basis.  Check your blood glucoses before each meal and at bedtime and maintain a log of your readings.  Bring this log with you when you follow up with your PCP so that he or she can adjust your insulin at your follow up visit.   Increase activity slowly   Complete by:  As directed        DISPOSITION: Home   DISCHARGE FOLLOW-UP Follow-up Information    Groat, Bertram Millard, MD. Schedule an appointment as soon as possible for a visit in 2 week(s).   Specialty:  Ophthalmology Why:  eye doctor appt for follow-up Contact information: [address] [place] [address] [phone]        Fleet Contras, MD. Schedule an appointment as soon as possible for a visit in 2 week(s).   Specialty:  Internal Medicine Contact information: [address] [place] [address] [phone]            Time spent on Discharge: 35 minutes  Signed:   Thad Ranger M.D. [facility] 12/12/2017, 11:11 AM Pager: [phone]   Coding query Diabetes mellitus, uncontrolled with opthalmological complications, visual blurring   Yaviel Kloster M.D. [facility] 12/25/2017, 3:49 PM  Pager: [phone]

## 2017-12-12 NOTE — Progress Notes (Signed)
CNA went in to pts room to get midnight vitals and pt cursed at CNA  And refused vitals this rn spoke with pt about cursing at staff and importance of getting vitals pt still refuses and will continue to monitor.

## 2017-12-12 NOTE — Progress Notes (Signed)
Discussed with the patient and all questioned fully answered. He will call me if any problems arise.  Pt given paper Rx. Verbalized understanding of all discharge medication changes and need to follow up. Taxi called.  Leonidas Romberg, RN

## 2017-12-12 NOTE — Progress Notes (Signed)
Pt refused morning labs, explained to pt need for lab checks pt still refuses

## 2017-12-12 NOTE — Progress Notes (Signed)
Spoke with pt regarding his discharge at 8:30 this morning. Pt states his keys are in his home and his roommates are not home. Asked pt to please call his roommates to facilitate discharge. Pt states roommates will not be home until 12:15, but they cannot pick him up. Spoke with Morrie Sheldon SW regarding cab voucher for the pt, she will drop off to facilitate discharge.   Leonidas Romberg, RN

## 2017-12-12 NOTE — Care Management Note (Signed)
Case Management Note  Patient Details  Name: [name] MRN: [id number] Date of Birth: Jun 15, 1986  Subjective/Objective:    Pt in with DKA. He is from home with roommate.                Action/Plan: Pt discharging home with self care. Pt having vision issues but states to CM they have resolved. He is able to read small print in the room. Pt feels he can administer his own insulin.  Pt asking for transportation home after 12 pm. He is not able to get into his home until after 12 pm. Morrie Sheldon with CSW notified for cab voucher. He states the bus doesn't go close to his room.  Pt has PCP and insurance.   Expected Discharge Date:  12/12/17               Expected Discharge Plan:  Home/Self Care  In-House Referral:     Discharge planning Services     Post Acute Care Choice:    Choice offered to:     DME Arranged:    DME Agency:     HH Arranged:    HH Agency:     Status of Service:  Completed, signed off  If discussed at [employer] of Stay Meetings, dates discussed:    Additional Comments:  Kermit Balo, RN 12/12/2017, 11:06 AM

## 2018-01-14 ENCOUNTER — Inpatient Hospital Stay (HOSPITAL_COMMUNITY): Payer: Medicare Other

## 2018-01-14 ENCOUNTER — Emergency Department (HOSPITAL_COMMUNITY): Payer: Medicare Other

## 2018-01-14 ENCOUNTER — Inpatient Hospital Stay (HOSPITAL_COMMUNITY)
Admission: EM | Admit: 2018-01-14 | Discharge: 2018-01-17 | DRG: 637 | Disposition: A | Discharge status: Home / Self Care | Payer: Medicare Other | Attending: Internal Medicine | Admitting: Internal Medicine

## 2018-01-14 ENCOUNTER — Encounter (HOSPITAL_COMMUNITY): Payer: Self-pay | Admitting: *Deleted

## 2018-01-14 DIAGNOSIS — E876 Hypokalemia: Secondary | ICD-10-CM | POA: Diagnosis not present

## 2018-01-14 DIAGNOSIS — R402352 Coma scale, best motor response, localizes pain, at arrival to emergency department: Secondary | ICD-10-CM | POA: Diagnosis present

## 2018-01-14 DIAGNOSIS — F17210 Nicotine dependence, cigarettes, uncomplicated: Secondary | ICD-10-CM | POA: Diagnosis present

## 2018-01-14 DIAGNOSIS — Z882 Allergy status to sulfonamides status: Secondary | ICD-10-CM | POA: Diagnosis not present

## 2018-01-14 DIAGNOSIS — J9601 Acute respiratory failure with hypoxia: Secondary | ICD-10-CM | POA: Diagnosis not present

## 2018-01-14 DIAGNOSIS — K219 Gastro-esophageal reflux disease without esophagitis: Secondary | ICD-10-CM | POA: Diagnosis present

## 2018-01-14 DIAGNOSIS — N179 Acute kidney failure, unspecified: Secondary | ICD-10-CM | POA: Diagnosis present

## 2018-01-14 DIAGNOSIS — Z9114 Patient's other noncompliance with medication regimen: Secondary | ICD-10-CM

## 2018-01-14 DIAGNOSIS — Z833 Family history of diabetes mellitus: Secondary | ICD-10-CM | POA: Diagnosis not present

## 2018-01-14 DIAGNOSIS — E874 Mixed disorder of acid-base balance: Secondary | ICD-10-CM | POA: Diagnosis present

## 2018-01-14 DIAGNOSIS — M069 Rheumatoid arthritis, unspecified: Secondary | ICD-10-CM | POA: Diagnosis present

## 2018-01-14 DIAGNOSIS — M19011 Primary osteoarthritis, right shoulder: Secondary | ICD-10-CM | POA: Diagnosis present

## 2018-01-14 DIAGNOSIS — E1011 Type 1 diabetes mellitus with ketoacidosis with coma: Principal | ICD-10-CM | POA: Diagnosis present

## 2018-01-14 DIAGNOSIS — Z884 Allergy status to anesthetic agent status: Secondary | ICD-10-CM | POA: Diagnosis not present

## 2018-01-14 DIAGNOSIS — M160 Bilateral primary osteoarthritis of hip: Secondary | ICD-10-CM | POA: Diagnosis present

## 2018-01-14 DIAGNOSIS — Z9119 Patient's noncompliance with other medical treatment and regimen: Secondary | ICD-10-CM | POA: Diagnosis not present

## 2018-01-14 DIAGNOSIS — F259 Schizoaffective disorder, unspecified: Secondary | ICD-10-CM | POA: Diagnosis present

## 2018-01-14 DIAGNOSIS — G9341 Metabolic encephalopathy: Secondary | ICD-10-CM | POA: Diagnosis not present

## 2018-01-14 DIAGNOSIS — E860 Dehydration: Secondary | ICD-10-CM | POA: Diagnosis present

## 2018-01-14 DIAGNOSIS — F419 Anxiety disorder, unspecified: Secondary | ICD-10-CM | POA: Diagnosis present

## 2018-01-14 DIAGNOSIS — R402122 Coma scale, eyes open, to pain, at arrival to emergency department: Secondary | ICD-10-CM | POA: Diagnosis present

## 2018-01-14 DIAGNOSIS — L89151 Pressure ulcer of sacral region, stage 1: Secondary | ICD-10-CM | POA: Diagnosis not present

## 2018-01-14 DIAGNOSIS — R402242 Coma scale, best verbal response, confused conversation, at arrival to emergency department: Secondary | ICD-10-CM | POA: Diagnosis present

## 2018-01-14 DIAGNOSIS — Z794 Long term (current) use of insulin: Secondary | ICD-10-CM

## 2018-01-14 DIAGNOSIS — L8941 Pressure ulcer of contiguous site of back, buttock and hip, stage 1: Secondary | ICD-10-CM | POA: Diagnosis not present

## 2018-01-14 DIAGNOSIS — M19012 Primary osteoarthritis, left shoulder: Secondary | ICD-10-CM | POA: Diagnosis present

## 2018-01-14 DIAGNOSIS — E1110 Type 2 diabetes mellitus with ketoacidosis without coma: Secondary | ICD-10-CM | POA: Diagnosis present

## 2018-01-14 DIAGNOSIS — I10 Essential (primary) hypertension: Secondary | ICD-10-CM | POA: Diagnosis present

## 2018-01-14 LAB — CBC WITH DIFFERENTIAL/PLATELET
BASOS ABS: 0 10*3/uL (ref 0.0–0.1)
Basophils Relative: 0 %
EOS PCT: 0 %
Eosinophils Absolute: 0 10*3/uL (ref 0.0–0.7)
HEMATOCRIT: 38.6 % — AB (ref 39.0–52.0)
HEMOGLOBIN: 12.8 g/dL — AB (ref 13.0–17.0)
LYMPHS PCT: 9 %
Lymphs Abs: 2.3 10*3/uL (ref 0.7–4.0)
MCH: 30.3 pg (ref 26.0–34.0)
MCHC: 33.2 g/dL (ref 30.0–36.0)
MCV: 91.5 fL (ref 78.0–100.0)
MONOS PCT: 10 %
Monocytes Absolute: 2.6 10*3/uL — ABNORMAL HIGH (ref 0.1–1.0)
NEUTROS ABS: 20.8 10*3/uL — AB (ref 1.7–7.7)
Neutrophils Relative %: 81 %
Platelets: 561 10*3/uL — ABNORMAL HIGH (ref 150–400)
RBC: 4.22 MIL/uL (ref 4.22–5.81)
RDW: 13.8 % (ref 11.5–15.5)
WBC: 25.7 10*3/uL — ABNORMAL HIGH (ref 4.0–10.5)

## 2018-01-14 LAB — COMPREHENSIVE METABOLIC PANEL
ALK PHOS: 152 U/L — AB (ref 38–126)
ALT: 18 U/L (ref 17–63)
AST: 17 U/L (ref 15–41)
Albumin: 4.1 g/dL (ref 3.5–5.0)
BUN: 30 mg/dL — ABNORMAL HIGH (ref 6–20)
CALCIUM: 9.6 mg/dL (ref 8.9–10.3)
CREATININE: 2.04 mg/dL — AB (ref 0.61–1.24)
Chloride: 99 mmol/L — ABNORMAL LOW (ref 101–111)
GFR calc non Af Amer: 41 mL/min — ABNORMAL LOW (ref >60)
GFR, EST AFRICAN AMERICAN: 48 mL/min — AB (ref >60)
Glucose, Bld: 746 mg/dL (ref 65–99)
Potassium: 5.7 mmol/L — ABNORMAL HIGH (ref 3.5–5.1)
SODIUM: 133 mmol/L — AB (ref 135–145)
Total Bilirubin: 1.5 mg/dL — ABNORMAL HIGH (ref 0.3–1.2)
Total Protein: 6.7 g/dL (ref 6.5–8.1)

## 2018-01-14 LAB — PROCALCITONIN: Procalcitonin: 0.55 ng/mL

## 2018-01-14 LAB — CBG MONITORING, ED
GLUCOSE-CAPILLARY: 593 mg/dL — AB (ref 65–99)
Glucose-Capillary: >600 mg/dL (ref 65–99)
Glucose-Capillary: 562 mg/dL (ref 65–99)

## 2018-01-14 LAB — I-STAT CHEM 8, ED
BUN: 29 mg/dL — AB (ref 6–20)
CHLORIDE: 108 mmol/L (ref 101–111)
Calcium, Ion: 1.34 mmol/L (ref 1.15–1.40)
Creatinine, Ser: 1.6 mg/dL — ABNORMAL HIGH (ref 0.61–1.24)
Glucose, Bld: >700 mg/dL (ref 65–99)
HEMATOCRIT: 41 % (ref 39.0–52.0)
Hemoglobin: 13.9 g/dL (ref 13.0–17.0)
Potassium: 5.6 mmol/L — ABNORMAL HIGH (ref 3.5–5.1)
SODIUM: 132 mmol/L — AB (ref 135–145)
TCO2: 5 mmol/L — ABNORMAL LOW (ref 22–32)

## 2018-01-14 LAB — PROTIME-INR
INR: 1.28
PROTHROMBIN TIME: 15.9 s — AB (ref 11.4–15.2)

## 2018-01-14 LAB — PHOSPHORUS: PHOSPHORUS: 7.3 mg/dL — AB (ref 2.5–4.6)

## 2018-01-14 LAB — MAGNESIUM: Magnesium: 2.3 mg/dL (ref 1.7–2.4)

## 2018-01-14 LAB — APTT: APTT: 33 s (ref 24–36)

## 2018-01-14 MED ORDER — HEPARIN SODIUM (PORCINE) 5000 UNIT/ML IJ SOLN
5000.0000 [IU] | Freq: Three times a day (TID) | INTRAMUSCULAR | Status: DC
  Administered 2018-01-15 – 2018-01-16 (×5): 5000 [IU] via SUBCUTANEOUS
  Filled 2018-01-14 (×7): qty 1

## 2018-01-14 MED ORDER — SODIUM CHLORIDE 0.9 % IV SOLN
INTRAVENOUS | Status: DC
  Administered 2018-01-14: 5.4 [IU]/h via INTRAVENOUS
  Filled 2018-01-14: qty 1

## 2018-01-14 MED ORDER — SODIUM CHLORIDE 0.9 % IV BOLUS (SEPSIS)
1000.0000 mL | Freq: Once | INTRAVENOUS | Status: AC
  Administered 2018-01-14: 1000 mL via INTRAVENOUS

## 2018-01-14 MED ORDER — SODIUM CHLORIDE 0.9 % IV SOLN
INTRAVENOUS | Status: DC
  Administered 2018-01-14: 15.1 [IU]/h via INTRAVENOUS
  Administered 2018-01-14: 5.4 [IU]/h via INTRAVENOUS
  Administered 2018-01-14: 10.7 [IU]/h via INTRAVENOUS
  Filled 2018-01-14 (×2): qty 1

## 2018-01-14 MED ORDER — ACETAMINOPHEN 325 MG PO TABS: 650.0000 mg | ORAL_TABLET | ORAL | Status: DC | PRN

## 2018-01-14 MED ORDER — VANCOMYCIN HCL IN DEXTROSE 1-5 GM/200ML-% IV SOLN
1000.0000 mg | Freq: Once | INTRAVENOUS | Status: AC
  Administered 2018-01-15: 1000 mg via INTRAVENOUS
  Filled 2018-01-14: qty 200

## 2018-01-14 MED ORDER — ASPIRIN 81 MG PO CHEW: 324.0000 mg | CHEWABLE_TABLET | ORAL | Status: AC

## 2018-01-14 MED ORDER — STERILE WATER FOR INJECTION IV SOLN
INTRAVENOUS | Status: DC
  Administered 2018-01-14: 22:00:00 via INTRAVENOUS
  Filled 2018-01-14: qty 850

## 2018-01-14 MED ORDER — SODIUM CHLORIDE 0.9 % IV BOLUS (SEPSIS): 1000.0000 mL | Freq: Once | INTRAVENOUS | Status: DC

## 2018-01-14 MED ORDER — SODIUM CHLORIDE 0.9 % IV SOLN
1.0000 g | Freq: Once | INTRAVENOUS | Status: AC
  Administered 2018-01-14: 1 g via INTRAVENOUS
  Filled 2018-01-14: qty 10

## 2018-01-14 MED ORDER — STERILE WATER FOR INJECTION IV SOLN
INTRAVENOUS | Status: DC
  Filled 2018-01-14 (×2): qty 850

## 2018-01-14 MED ORDER — SODIUM CHLORIDE 0.9 % IV SOLN
INTRAVENOUS | Status: DC
  Administered 2018-01-14: 20:00:00 via INTRAVENOUS

## 2018-01-14 MED ORDER — ONDANSETRON HCL 4 MG/2ML IJ SOLN: 4.0000 mg | Freq: Four times a day (QID) | INTRAMUSCULAR | Status: DC | PRN

## 2018-01-14 MED ORDER — SODIUM BICARBONATE 8.4 % IV SOLN
200.0000 meq | Freq: Once | INTRAVENOUS | Status: DC
  Administered 2018-01-14: 50 meq via INTRAVENOUS
  Filled 2018-01-14: qty 50

## 2018-01-14 MED ORDER — SODIUM BICARBONATE 8.4 % IV SOLN
INTRAVENOUS | Status: AC
  Administered 2018-01-14: 50 meq via INTRAVENOUS
  Filled 2018-01-14: qty 50

## 2018-01-14 MED ORDER — SODIUM CHLORIDE 0.9 % IV SOLN: 250.0000 mL | INTRAVENOUS | Status: DC | PRN

## 2018-01-14 MED ORDER — PIPERACILLIN-TAZOBACTAM 3.375 G IVPB 30 MIN
3.3750 g | Freq: Once | INTRAVENOUS | Status: AC
  Administered 2018-01-14: 3.375 g via INTRAVENOUS
  Filled 2018-01-14: qty 50

## 2018-01-14 NOTE — ED Notes (Signed)
Bed: FM38 Expected date:  Expected time:  Means of arrival:  Comments: 32 yo dka

## 2018-01-14 NOTE — ED Provider Notes (Signed)
[facility]-EMERGENCY DEPT Provider Note   CSN: [id number] Arrival date & time: 01/14/18  1720    Level 5 caveat: Altered mental status History   Chief Complaint Chief Complaint  Patient presents with  . Hyperglycemia    HPI [name] is a 32 y.o. male.  HPI Patient presents to the emergency room for evaluation of altered mental status and high blood sugar.  Patient has a history of diabetic ketoacidosis.  The history is limited because the patient's not able to communicate.  According to the EMS report, they were called because the patient was combative and lethargic.  EMS noted that his CBG was reading high.  Patient's moaning and with drawing from IV attempts.  He is not able to communicate and provide any history. Past Medical History:  Diagnosis Date  . Anemia   . Anxiety   . Arthritis    "both hips; both shoulders" (11/05/2017)  . Chicken pox   . Childhood asthma   . Chronic pain   . Depression   . DKA (diabetic ketoacidoses) ([name]) 11/18/2014  . DKA (diabetic ketoacidoses) ([name]) 07/05/2017  . GERD (gastroesophageal reflux disease)   . Hypertension   . Migraine    "a few/year" (06/06/2017)  . Noncompliance with medication regimen   . Pneumonia    "several times" (06/06/2017)  . Polysubstance abuse ([name])   . Schizo affective schizophrenia ([name])   . Scoliosis   . Type I diabetes mellitus [facility])     Patient Active Problem List   Diagnosis Date Noted  . Vision loss 12/10/2017  . DKA (diabetic ketoacidosis) ([name]) 11/03/2017  . Pressure injury of skin 10/11/2017  . Encephalopathy acute   . Hyperbilirubinemia 09/14/2017  . Heel ulcer ([name]) 09/14/2017  . Noncompliance with medication regimen   . Hypokalemia   . Non healing left heel wound   . Cellulitis   . DKA, type 1 ([name]) 08/17/2017  . Encounter for imaging study to confirm orogastric (OG) tube placement   . Acute respiratory acidosis   . DKA (diabetic ketoacidoses) ([name])  08/04/2017  . Acute respiratory failure with hypoxia ([name])   . Seizure ([name])   . Diabetic ketoacidosis ([name]) 07/30/2017  . Normocytic anemia 07/05/2017  . Numbness of right hand 07/05/2017  . Elevated troponin I measurement   . Positive D dimer   . DKA, type 1, not at goal [facility]) 07/01/2017  . Hyperkalemia   . AKI (acute kidney injury) ([name])   . Medically noncompliant   . Hyperglycemia 06/16/2017  . Nausea & vomiting 06/15/2017  . Abdominal pain 06/15/2017  . Sepsis ([name]) 06/07/2017  . Chest pain 06/07/2017  . GERD (gastroesophageal reflux disease) 06/07/2017  . Substance abuse ([name]) 05/30/2016  . Substance induced mood disorder ([name]) 05/30/2016  . Tobacco use disorder 12/24/2015  . DM hyperosmolarity type I, uncontrolled ([name]) 12/19/2015  . Generalized headache 07/22/2015  . Bilateral hip bursitis 05/26/2015  . Depression   . Generalized anxiety disorder 03/29/2015  . Undifferentiated schizophrenia ([name])   . Hip pain, bilateral 11/18/2014  . Dehydration 06/17/2012    Past Surgical History:  Procedure Laterality Date  . CARDIAC CATHETERIZATION  11/2016  . INCISION AND DRAINAGE ABSCESS Left 11/2011   "MRSA removed off my thumb"       Home Medications    Prior to Admission medications   Medication Sig Start Date End Date Taking? Authorizing Provider  amphetamine-dextroamphetamine (ADDERALL) 30 MG tablet Take 30 mg daily by mouth. 10/02/17  [provider]  doxepin (SINEQUAN) 10 MG/ML solution Take 10 mLs by mouth at bedtime. 07/11/17   [provider]  insulin aspart (NOVOLOG FLEXPEN) 100 UNIT/ML FlexPen Inject 0-15 Units into the skin See admin instructions. Check Blood Sugar 4 times per day > with meals and at bedtime CBG 70 - 120: 0 units CBG 121 - 150: 2 units CBG 151 - 200: 3 units CBG 201 - 250: 5 units CBG 251 - 300: 8 units CBG 301 - 350: 11 units CBG 351 - 400: 15 units 12/12/17   Rai, Ripudeep K, MD  insulin detemir (LEVEMIR) 100 UNIT/ML  injection Inject 0.12 mLs (12 Units total) into the skin 2 (two) times daily. 12/12/17   Rai, Delene Ruffini, MD  metoCLOPramide (REGLAN) 10 MG tablet Take 1 tablet (10 mg total) by mouth 3 (three) times daily as needed for nausea or vomiting. 12/12/17   Rai, Delene Ruffini, MD  mupirocin cream (BACTROBAN) 2 % Apply topically daily. Apply Bactroban to left heel wound Q day, then cover with foam dressing.  (Change foam dressing Q 3 days or PRN soiling.) 12/12/17   Rai, Ripudeep K, MD  nicotine (NICODERM CQ - DOSED IN MG/24 HOURS) 14 mg/24hr patch Place 1 patch (14 mg total) onto the skin daily. 12/06/17   Mikhail, Nita Sells, DO  pantoprazole (PROTONIX) 40 MG tablet Take 1 tablet (40 mg total) by mouth 2 (two) times daily before a meal. 12/12/17   Rai, Ripudeep K, MD  sennosides-docusate sodium (SENOKOT-S) 8.6-50 MG tablet Take 1 tablet by mouth as needed for constipation.    [provider]  traZODone (DESYREL) 150 MG tablet Take 150 mg by mouth at bedtime. 06/26/17   [provider]  citalopram (CELEXA) 20 MG tablet Take 20 mg by mouth daily.  02/26/12  [provider]  FLUoxetine (PROZAC) 20 MG tablet Take 20 mg by mouth every morning.  11/17/14  [provider]  insulin NPH-regular Human (NOVOLIN 70/30) (70-30) 100 UNIT/ML injection Inject 40 Units into the skin 2 (two) times daily with a meal. 02/26/16 04/04/16  Street, [place], PA-C  OLANZapine zydis (ZYPREXA) 10 MG disintegrating tablet Take 10 mg by mouth 2 (two) times daily.  11/17/14  [provider]  sertraline (ZOLOFT) 50 MG tablet Take 50 mg by mouth daily. For depression. Just started med, have not picked up yet [facility] [address] 02/22/12 03/08/12  Viviann Spare, FNP    Family History Family History  Problem Relation Age of Onset  . Diabetes Mother     Social History Social History   Tobacco Use  . Smoking status: Current Every Day Smoker    Packs/day: 0.50    Years: 9.00    Pack years: 4.50      Types: Cigarettes  . Smokeless tobacco: Never Used  Substance Use Topics  . Alcohol use: No  . Drug use: Yes    Types: Marijuana    Comment: 11/05/2017 "nonesince 2012"; pt denies hx of cocaine and methamphetamines use on 11/05/2017     Allergies   Sulfa antibiotics and Fentanyl   Review of Systems Review of Systems  All other systems reviewed and are negative.    Physical Exam Updated Vital Signs BP 102/60   Pulse 84   Temp (S) (!) 90 F (32.2 C) (Rectal) Comment: MD notified  Resp 19   SpO2 100%   Physical Exam  Constitutional: He appears listless. No distress.  HENT:  Head: Normocephalic and atraumatic.  Right Ear: External ear normal.  Left Ear: External ear normal.  Mouth/Throat: Dental caries present.  Mm dry  Eyes: Conjunctivae are normal. Right eye exhibits no discharge. Left eye exhibits no discharge. No scleral icterus.  Neck: Neck supple. No tracheal deviation present.  Cardiovascular: Normal rate, regular rhythm and intact distal pulses.  Pulmonary/Chest: Effort normal and breath sounds normal. No stridor. No respiratory distress. He has no wheezes. He has no rales.  Abdominal: Soft. Bowel sounds are normal. He exhibits no distension. There is no tenderness. There is no rebound and no guarding.  Musculoskeletal: He exhibits no edema or tenderness.  Neurological: He appears listless. No cranial nerve deficit (no facial droop, extraocular movements intact, moans) or sensory deficit. He exhibits normal muscle tone. He displays no seizure activity. GCS eye subscore is 2. GCS verbal subscore is 2. GCS motor subscore is 5.  Moving all extremities, withdraws from IV attempts  Skin: Skin is warm and dry. No rash noted. He is not diaphoretic.  Psychiatric: He has a normal mood and affect.  Nursing note and vitals reviewed.    ED Treatments / Results  Labs (all labs ordered are listed, but only abnormal results are displayed) Labs Reviewed  CBC WITH  DIFFERENTIAL/PLATELET - Abnormal; Notable for the following components:      Result Value   WBC 25.7 (*)    Hemoglobin 12.8 (*)    HCT 38.6 (*)    Platelets 561 (*)    All other components within normal limits  COMPREHENSIVE METABOLIC PANEL - Abnormal; Notable for the following components:   Sodium 133 (*)    Potassium 5.7 (*)    Chloride 99 (*)    CO2 <7 (*)    Glucose, Bld 746 (*)    BUN 30 (*)    Creatinine, Ser 2.04 (*)    Alkaline Phosphatase 152 (*)    Total Bilirubin 1.5 (*)    GFR calc non Af Amer 41 (*)    GFR calc Af Amer 48 (*)    All other components within normal limits  CBG MONITORING, ED - Abnormal; Notable for the following components:   Glucose-Capillary >600 (*)    All other components within normal limits  I-STAT CHEM 8, ED - Abnormal; Notable for the following components:   Sodium 132 (*)    Potassium 5.6 (*)    BUN 29 (*)    Creatinine, Ser 1.60 (*)    Glucose, Bld >700 (*)    TCO2 5 (*)    All other components within normal limits  URINE CULTURE  CULTURE, BLOOD (ROUTINE X 2)  CULTURE, BLOOD (ROUTINE X 2)  BLOOD GAS, VENOUS  URINALYSIS, ROUTINE W REFLEX MICROSCOPIC  BLOOD GAS, VENOUS  BASIC METABOLIC PANEL    EKG  EKG Interpretation  Date/Time:  Monday January 14 2018 18:10:55 EST Ventricular Rate:  85 PR Interval:    QRS Duration: 108 QT Interval:  427 QTC Calculation: 508 R Axis:   5 Text Interpretation:  Sinus rhythm Probable anteroseptal infarct, old ST elevation, consider inferior injury Prolonged QT interval , new since last tracing Confirmed by Linwood Dibbles [phone]) on 01/14/2018 6:25:04 PM       Radiology Dg Chest Portable 1 View  Result Date: 01/14/2018 CLINICAL DATA:  32 year old male with history of combativeness and lethargy. EXAM: PORTABLE CHEST 1 VIEW COMPARISON:  Chest x-ray 12/10/2017. FINDINGS: Lung volumes are normal. No consolidative airspace disease. No pleural effusions. No pneumothorax. No pulmonary nodule or mass noted.  Pulmonary vasculature and the cardiomediastinal silhouette are within normal limits. IMPRESSION: No radiographic evidence of acute cardiopulmonary disease. Electronically Signed   By: Trudie Reed M.D.   On: 01/14/2018 18:36    Procedures .Critical Care Performed by: Linwood Dibbles, MD Authorized by: Linwood Dibbles, MD   Critical care provider statement:    Critical care time (minutes):  45   Critical care was necessary to treat or prevent imminent or life-threatening deterioration of the following conditions:  Endocrine crisis   Critical care was time spent personally by me on the following activities:  Discussions with consultants, evaluation of patient's response to treatment, examination of patient, ordering and performing treatments and interventions, ordering and review of laboratory studies, ordering and review of radiographic studies, pulse oximetry, re-evaluation of patient's condition, obtaining history from patient or surrogate and review of old charts   (including critical care time)  Medications Ordered in ED Medications  sodium chloride 0.9 % bolus 1,000 mL (1,000 mLs Intravenous New Bag/Given 01/14/18 1805)    And  sodium chloride 0.9 % bolus 1,000 mL (1,000 mLs Intravenous New Bag/Given 01/14/18 1857)    And  0.9 %  sodium chloride infusion (not administered)  insulin regular (NOVOLIN R,HUMULIN R) 100 Units in sodium chloride 0.9 % 100 mL (1 Units/mL) infusion (5.4 Units/hr Intravenous New Bag/Given 01/14/18 1917)  sodium bicarbonate 150 mEq in sterile water 1,000 mL infusion (not administered)  sodium bicarbonate injection 200 mEq (not administered)     Initial Impression / Assessment and Plan / ED Course  I have reviewed the triage vital signs and the nursing notes.  Pertinent labs & imaging results that were available during my care of the patient were reviewed by me and considered in my medical decision making (see chart for details).  Clinical Course as of Jan 14 1929    Mon Jan 14, 2018  1825 Pt is agitated.  Likely related to his acute DKA.  Will restrain so we can resuscitate properly.  Avoid any sedative meds right now with his AMS and electrolye abnormalities.  [JK]  1826 Chem 8 shows metabolic acidosis, increased anion gap, c/w DKA.  Fluids ordered.  Will add insulin drip  [JK]  1827 Vbg does not register the ph.  Assume it is very low  [JK]  1838 Warming blanket started with his low temp.  [JK]  1926 I discussed the case with Dr. Arsenio Loader regarding admission to critical care.  He recommends giving the patient 4 amp of sodium bicarb and starting on a sodium bicarb drip.  Critical care will admit  [JK]  1927 Will repeat labs in 1 hr.    [JK]    Clinical Course User Index [JK] Linwood Dibbles, MD    Patient presented to the emergency room with altered mental status and hyperglycemia.  Patient's laboratory tests are consistent with DKA.  He has a profound metabolic acidosis.  His pH is not measurable.  Patient has persistent altered mental status.  IV fluids and insulin infusion was started.  Bicarb drip was initiated in consultation with critical care service.  Plan admission to the critical care unit for further treatment  Final Clinical Impressions(s) / ED Diagnoses   Final diagnoses:  Diabetic ketoacidosis with coma associated with type 1 diabetes mellitus ([name])  Acute kidney injury ([name])  Metabolic encephalopathy      Linwood Dibbles, MD 01/14/18 1930

## 2018-01-14 NOTE — H&P (Addendum)
.. ..  Name: [name] MRN: [id number] DOB: Mar 02, 1986    ADMISSION DATE:  01/14/2018 CONSULTATION DATE: 01/14/18  REFERRING MD : Linwood Dibbles MD  CHIEF COMPLAINT: altered mental status, elevated blood sugars  BRIEF PATIENT DESCRIPTION:  32 year old male with past medical history of insulin dept. diabetes mellitus ,rheumatoid arthritis and synovitis ( Dec 2018 MR shoulder) Previous history of DKA, hypertension, noncompliance with medication, schizoaffective schizophrenia, polysubstance abuse, and and anxiety. Presents with blood sugars in the 700s and kussmaul breathing.   SIGNIFICANT EVENTS  VBG- unreadable ph  STUDIES:  CXR   HISTORY OF PRESENT ILLNESS:  History obtained from the EMR, other care providers including ED Physician and ER Nurse, pt unable to participate, moaning answers yes and no questions not much else.  31 year old male with past medical history of insulin dept. diabetes mellitus ,rheumatoid arthritis and synovitis ( Dec 2018 MR shoulder) Previous history of DKA, hypertension, noncompliance with medication, schizoaffective schizophrenia, polysubstance abuse, and and anxiety. Presents with blood sugars in the 700s and kussmaul breathing. He has had multiple admissions for DKA in the past.  Pt presenting from home, lives with a roommate. Roomate found him down and unresponsive and checked his blood sugar which read TOO HIGH. The roommate then called EMS. Per EDP pt was altered. EMS reported that patient was combative and lethargic.   PAST MEDICAL HISTORY :   has a past medical history of Anemia, Anxiety, Arthritis, Chicken pox, Childhood asthma, Chronic pain, Depression, DKA (diabetic ketoacidoses) ([name]) (11/18/2014), DKA (diabetic ketoacidoses) ([name]) (07/05/2017), GERD (gastroesophageal reflux disease), Hypertension, Migraine, Noncompliance with medication regimen, Pneumonia, Polysubstance abuse ([name]), Schizo affective schizophrenia ([name]), Scoliosis, and Type I  diabetes mellitus ([name]).  has a past surgical history that includes Incision and drainage abscess (Left, 11/2011) and Cardiac catheterization (11/2016). Prior to Admission medications   Medication Sig Start Date End Date Taking? Authorizing Provider  amphetamine-dextroamphetamine (ADDERALL) 30 MG tablet Take 30 mg daily by mouth. 10/02/17   [provider]  doxepin (SINEQUAN) 10 MG/ML solution Take 10 mLs by mouth at bedtime. 07/11/17   [provider]  insulin aspart (NOVOLOG FLEXPEN) 100 UNIT/ML FlexPen Inject 0-15 Units into the skin See admin instructions. Check Blood Sugar 4 times per day > with meals and at bedtime CBG 70 - 120: 0 units CBG 121 - 150: 2 units CBG 151 - 200: 3 units CBG 201 - 250: 5 units CBG 251 - 300: 8 units CBG 301 - 350: 11 units CBG 351 - 400: 15 units 12/12/17   Rai, Ripudeep K, MD  insulin detemir (LEVEMIR) 100 UNIT/ML injection Inject 0.12 mLs (12 Units total) into the skin 2 (two) times daily. 12/12/17   Rai, Delene Ruffini, MD  metoCLOPramide (REGLAN) 10 MG tablet Take 1 tablet (10 mg total) by mouth 3 (three) times daily as needed for nausea or vomiting. 12/12/17   Rai, Delene Ruffini, MD  mupirocin cream (BACTROBAN) 2 % Apply topically daily. Apply Bactroban to left heel wound Q day, then cover with foam dressing.  (Change foam dressing Q 3 days or PRN soiling.) 12/12/17   Rai, Ripudeep K, MD  nicotine (NICODERM CQ - DOSED IN MG/24 HOURS) 14 mg/24hr patch Place 1 patch (14 mg total) onto the skin daily. 12/06/17   Mikhail, Nita Sells, DO  pantoprazole (PROTONIX) 40 MG tablet Take 1 tablet (40 mg total) by mouth 2 (two) times daily before a meal. 12/12/17   Rai, Ripudeep K, MD  sennosides-docusate sodium (SENOKOT-S) 8.6-50 MG tablet  Take 1 tablet by mouth as needed for constipation.    [provider]  traZODone (DESYREL) 150 MG tablet Take 150 mg by mouth at bedtime. 06/26/17   [provider]  citalopram (CELEXA) 20 MG tablet Take 20 mg  by mouth daily.  02/26/12  [provider]  FLUoxetine (PROZAC) 20 MG tablet Take 20 mg by mouth every morning.  11/17/14  [provider]  insulin NPH-regular Human (NOVOLIN 70/30) (70-30) 100 UNIT/ML injection Inject 40 Units into the skin 2 (two) times daily with a meal. 02/26/16 04/04/16  Street, [place], PA-C  OLANZapine zydis (ZYPREXA) 10 MG disintegrating tablet Take 10 mg by mouth 2 (two) times daily.  11/17/14  [provider]  sertraline (ZOLOFT) 50 MG tablet Take 50 mg by mouth daily. For depression. Just started med, have not picked up yet [facility] [address] 02/22/12 03/08/12  Viviann Spare, FNP   Allergies  Allergen Reactions  . Sulfa Antibiotics Other (See Comments)    Unknown childhood allergy  . Fentanyl Other (See Comments)    Patient had hallucinations with the medication    FAMILY HISTORY:  family history includes Diabetes in his mother. SOCIAL HISTORY:  reports that he has been smoking cigarettes.  He has a 4.50 pack-year smoking history. he has never used smokeless tobacco. He reports that he uses drugs. Drug: Marijuana. He reports that he does not drink alcohol.  REVIEW OF SYSTEMS:  (unable to illicit due to metabolic encephalopathy) Constitutional: Negative for fever, chills, weight loss, malaise/fatigue and diaphoresis.  HENT: Negative for hearing loss, ear pain, nosebleeds, congestion, sore throat, neck pain, tinnitus and ear discharge.   Eyes: Negative for blurred vision, double vision, photophobia, pain, discharge and redness.  Respiratory: Negative for cough, hemoptysis, sputum production, shortness of breath, wheezing and stridor.   Cardiovascular: Negative for chest pain, palpitations, orthopnea, claudication, leg swelling and PND.  Gastrointestinal: Negative for heartburn, nausea, vomiting, abdominal pain, diarrhea, constipation, blood in stool and melena.  Genitourinary: Negative for dysuria, urgency, frequency, hematuria and flank  pain.  Musculoskeletal: Negative for myalgias, back pain, joint pain and falls.  Skin: Negative for itching and rash.  Neurological: Negative for dizziness, tingling, tremors, sensory change, speech change, focal weakness, seizures, loss of consciousness, weakness and headaches.  Endo/Heme/Allergies: Negative for environmental allergies and polydipsia. Does not bruise/bleed easily.  SUBJECTIVE:    VITAL SIGNS: Temp:  [90 F (32.2 C)] 90 F (32.2 C) (01/28 1817) Pulse Rate:  [84-90] 84 (01/28 1832) Resp:  [15-19] 19 (01/28 1832) BP: (102-119)/(60-65) 102/60 (01/28 1832) SpO2:  [99 %-100 %] 100 % (01/28 1832)  PHYSICAL EXAMINATION: General:  Thin male, protecting his airway, moaning Neuro:  Altered. GCS 10 HEENT:  Normocephalic atraumatic  Cardiovascular:  Tachycardic, no discernible murmur or rub on exam Lungs:  Clear to auscultation bilaterally, increased resp rate, good air entry Abdomen:  Soft non tender with + BS, scaphoid not distended Musculoskeletal:  thin Skin:  Blanching petechiae, nothing under fingernails, spares, palms and soles, seen on chest and shins mostly.  Recent Labs  Lab 01/14/18 1754 01/14/18 1806  NA 133* 132*  K 5.7* 5.6*  CL 99* 108  CO2 <7*  --   BUN 30* 29*  CREATININE 2.04* 1.60*  GLUCOSE 746* >700*   Recent Labs  Lab 01/14/18 1754 01/14/18 1806  HGB 12.8* 13.9  HCT 38.6* 41.0  WBC 25.7*  --   PLT 561*  --    Dg Chest Portable 1 View  Result Date: 01/14/2018 CLINICAL DATA:  32 year old male with history of combativeness and lethargy. EXAM: PORTABLE CHEST 1 VIEW COMPARISON:  Chest x-ray 12/10/2017. FINDINGS: Lung volumes are normal. No consolidative airspace disease. No pleural effusions. No pneumothorax. No pulmonary nodule or mass noted. Pulmonary vasculature and the cardiomediastinal silhouette are within normal limits. IMPRESSION: No radiographic evidence of acute cardiopulmonary disease. Electronically Signed   By: Trudie Reed M.D.    On: 01/14/2018 18:36    ASSESSMENT / PLAN: 32year old IDDM presents with AGMA secondary to DKA  NEURO: Metabolic encephalopathy GCS 10- 12 Not requiring any sedation Past MR Brain- normal in Dec 2018 Schizoaffective Hx-> Trazadone on MAR Will hold medication at this time Will start on Nicotine replacement while inpatient Altered mental status should improve with correction of acid base abnormality  CARDIAC: Pseudohyperkalemia secondary to Hyperglycemia Continue IVF resuscitation Some PVCs and peaked T waves noted on EKG Calcium 2g IV for stabilization of cardiac membrane anticipate that EKG changes will normalize as DKA improves. Pt continues on insulin ggt Non blanching petechiae no new murmur- polysubstance abuse h/o Get a 2 D echo and UDS Low suspicion for endocarditis Hemodynamically stable, not on vasopressors   PULMONARY: Kussmaul breathing Severe Anion Gap Metabolic Acidosis with Respiratory Alkalosis Protecting his airway No desaturations No new infiltrates on CXR   ID: Leukocytosis on CBC Source of infection? Pt has a h/o synovitis No infiltrates on CXR Sent UA Sent blood cx x 2  Sent PCT Sent LA No empiric abx at this time Check MRSA screen  Endocrine: IDDM- in DKA Severe AG Metabolic acidosis Because ph was less than 7- Bicarb is warranted Will repeat VBG- and assess ph BMP and VBG Q4  On Phase 2 ICU Glycemic protocol with Insulin ggt ( Glucostabilizer) Quite possible that DKA is secondary to noncompliance which is complicated by his Psych history, Polysubstance abuse and social factors. Sent beta OH butyrate and HgbA1c  GI: No active issues at this time NPO except meds H/o Gastroparesis on Reglan 10mg  tab Started on Zofran for nausea Once he starts PO diet we will restart Reglan Monitor QTC   Heme: CBC- hemoconcentrated will check in 12 hrs post aggressive resuscitation No active issues If Hgb<7 transfuse PRBCs ..O POS No signs of  active bleeding or coagulopathy DVT PPx-> Hep  and SCDs  RENAL Acute Kidney Injury Diabetic nephropathy? Check UA with microalbumin Baseline Cr Lab Results  Component Value Date   CREATININE 1.60 (H) 01/14/2018   CREATININE 2.04 (H) 01/14/2018   CREATININE 0.81 12/11/2017  continue IVF Severe AG Metabolic Acidosis-> IVF, Insulin ggt, Bicarb due to ph <7 Pseudohyperkalemia secondary to hyperglycemia As BG corrects potassium will normalize and eventually be hypokalemic Continue BMP Q 4 Checking Mg and Phos   I, Dr 12/13/2017 have personally reviewed patient's available data, including medical history, events of note, physical examination and test results as part of my evaluation. I have discussed with NP and other care providers such as pharmacist, RN and Elink. The patient is critically ill with multiple organ systems failure and requires high complexity decision making for assessment and support, frequent evaluation and titration of therapies, application of advanced monitoring technologies and extensive interpretation of multiple databases. Critical Care Time devoted to patient care services described in this note is 65 Minutes. This time reflects time of care of this signee Dr Newell Coral. This critical care time does not reflect procedure time, or teaching time or supervisory time of NP but could  involve care discussion time    DISPOSITION: ICU  CC TIME: 65 MINS PROGNOSIS: GUARDED FAMILY: NONE AT BEDSIDE AT TIME OF MY EVAL CODE STATUS: Full  Signed Dr Newell Coral Pulmonary Critical Care Locums   01/14/2018, 8:16 PM

## 2018-01-14 NOTE — Progress Notes (Signed)
eLink Physician-Brief Progress Note Patient Name: [name] DOB: 07/05/1986 MRN: [id number]   Date of Service  01/14/2018  HPI/Events of Note  Agitation - Request for bilateral soft wrist restraints.   eICU Interventions  Will order soft bilateral wrist restraints.      Intervention Category Minor Interventions: Agitation / anxiety - evaluation and management  [name] 01/14/2018, 10:53 PM

## 2018-01-14 NOTE — ED Triage Notes (Signed)
Pt is from home with c/o of combativeness and lethargy. Pt has hx of DM and DKA. Pt CBG via [facility] read HI as well as CBG reading at this facility.

## 2018-01-14 NOTE — ED Notes (Signed)
ED TO INPATIENT HANDOFF REPORT  Name/Age/Gender Adonis Huguenin 32 y.o. male  Code Status    Code Status Orders  (From admission, onward)        Start     Ordered   01/14/18 2003  Full code  Continuous     01/14/18 2008    Code Status History    Date Active Date Inactive Code Status Order ID Comments User Context   12/10/2017 01:06 12/12/2017 15:49 Full Code [id number]  Vianne Bulls, MD ED   12/02/2017 20:09 12/05/2017 16:30 Full Code [id number]  Omar Person, NP ED   11/03/2017 06:47 11/06/2017 22:31 Full Code [id number]  Carlyon Prows, DO ED   11/03/2017 06:13 11/03/2017 06:47 Full Code [id number]  Carlyon Prows, DO ED   10/20/2017 21:30 10/23/2017 15:12 Full Code [id number]  Omar Person, NP ED   10/11/2017 00:49 10/13/2017 16:39 Full Code [id number]  Omar Person, NP ED   09/14/2017 00:28 09/15/2017 20:02 Full Code [id number]  Vianne Bulls, MD ED   09/07/2017 18:47 09/10/2017 19:49 Full Code [id number]  Corey Harold, NP ED   08/17/2017 10:25 08/20/2017 21:22 Full Code [id number]  Chesley Mires, MD ED   08/04/2017 19:30 08/08/2017 20:08 Full Code [id number]  Judeth Porch, MD ED   08/04/2017 19:26 08/04/2017 19:30 Full Code [id number]  Judeth Porch, MD ED   07/30/2017 12:00 07/31/2017 18:38 Full Code [id number]  Arnell Asal, NP ED   07/30/2017 11:50 07/30/2017 12:00 Full Code [id number]  Arnell Asal, NP ED   07/05/2017 00:17 07/06/2017 21:15 Full Code [id number]  Vianne Bulls, MD ED   07/01/2017 13:22 07/03/2017 19:47 Full Code [id number]  Jamesetta So, MD ED   06/15/2017 22:48 06/19/2017 16:25 Full Code [id number]  Ivor Costa, MD ED   06/06/2017 20:39 06/11/2017 19:18 Full Code [id number]  Norval Morton, MD ED   06/03/2017 04:02 06/05/2017 15:07 Full Code [id number]  Reyne Dumas, MD ED   06/03/2017 03:59 06/03/2017 04:02 Full Code [id number]  Reyne Dumas, MD ED   05/30/2016 05:05 05/31/2016 08:29 Full Code [id number]  Palumbo, April, MD ED   01/22/2016  19:50 01/24/2016 15:38 Full Code [id number]  Blanchie Dessert, MD ED   01/22/2016 17:16 01/22/2016 19:50 Full Code [id number]  Blanchie Dessert, MD ED   12/24/2015 12:22 12/27/2015 17:12 Full Code [id number]  Clovis Fredrickson, MD Inpatient   12/19/2015 17:31 12/24/2015 12:22 Full Code [id number]  Modena Jansky, MD Inpatient   11/25/2015 18:51 11/26/2015 19:56 Full Code [id number]  Lavina Hamman, MD ED   11/25/2015 15:30 11/25/2015 18:51 Full Code [id number]  Deno Etienne, DO ED   11/05/2015 01:05 11/08/2015 18:18 Full Code [id number]  Rise Patience, MD Inpatient   06/01/2015 16:14 06/03/2015 17:16 Full Code [id number]  Theodis Blaze, MD Inpatient   04/19/2015 22:52 04/20/2015 13:29 Full Code [id number]  Junius Creamer, NP ED   04/18/2015 21:20 04/19/2015 15:42 Full Code [id number]  Noland Fordyce, PA-C ED   03/31/2015 19:27 04/01/2015 13:38 Full Code [id number]  Carmin Muskrat, MD ED   03/19/2015 18:28 03/26/2015 18:32 Full Code [id number]  Delfin Gant, NP Inpatient   03/18/2015 20:24 03/19/2015 18:28 Full Code [id number]  Sheilah Pigeon ED   01/31/2015 15:52 02/02/2015 16:24 Full Code [id number]  Caren Griffins, MD ED   01/23/2015 18:18 01/24/2015 20:27 Full Code [id number]  Thurnell Lose, MD Inpatient   01/23/2015 15:17 01/23/2015 18:18 Full Code [id number]  Thurnell Lose,  MD ED   12/21/2014 16:41 12/22/2014 17:27 Full Code [id number]  Phillips Climes, MD Inpatient   11/18/2014 04:58 11/22/2014 19:51 Full Code [id number]  Ivor Costa, MD ED   05/10/2012 14:40 05/11/2012 17:03 Full Code [id number]  Mervin Kung., MD ED   04/25/2012 13:06 04/26/2012 19:14 Full Code [id number]  Alfonzo Feller, DO ED   03/05/2012 15:25 03/06/2012 03:30 Full Code [id number]  Hoy Morn, MD ED   02/25/2012 15:38 02/26/2012 16:33 Full Code [id number]  Winferd Humphrey, RN Inpatient   02/17/2012 12:40 02/18/2012 00:05 Full Code [id number]  Sheliah Mends, PA-C ED      Home/SNF/Other Home  Chief  Complaint hyperglycemia  Level of Care/Admitting Diagnosis ED Disposition    ED Disposition Condition [facility] Area: [facility] [[id number]]  Level of Care: ICU [6]  Diagnosis: DKA (diabetic ketoacidoses) [facility]) [[id number]]  Admitting Physician: Kandice Hams [[id number]]  Attending Physician: Kandice Hams [[id number]]  Estimated length of stay: 3 - 4 days  Certification:: I certify this patient will need inpatient services for at least 2 midnights  PT Class (Do Not Modify): Inpatient [101]  PT Acc Code (Do Not Modify): Private [1]       Medical History Past Medical History:  Diagnosis Date  . Anemia   . Anxiety   . Arthritis    "both hips; both shoulders" (11/05/2017)  . Chicken pox   . Childhood asthma   . Chronic pain   . Depression   . DKA (diabetic ketoacidoses) ([place]) 11/18/2014  . DKA (diabetic ketoacidoses) ([place]) 07/05/2017  . GERD (gastroesophageal reflux disease)   . Hypertension   . Migraine    "a few/year" (06/06/2017)  . Noncompliance with medication regimen   . Pneumonia    "several times" (06/06/2017)  . Polysubstance abuse ([place])   . Schizo affective schizophrenia ([place])   . Scoliosis   . Type I diabetes mellitus ([name])     Allergies Allergies  Allergen Reactions  . Sulfa Antibiotics Other (See Comments)    Unknown childhood allergy  . Fentanyl Other (See Comments)    Patient had hallucinations with the medication    IV Location/Drains/Wounds Patient Lines/Drains/Airways Status   Active Line/Drains/Airways    Name:   Placement date:   Placement time:   Site:   Days:   Peripheral IV 01/14/18 Left Wrist   01/14/18    -    Wrist   less than 1   Pressure Injury 11/06/17 Stage I -  Intact skin with non-blanchable redness of a localized area usually over a bony prominence. nonblanchable redness   11/06/17    0840     69   Pressure Injury 10/21/17 Stage I -  Intact skin with non-blanchable redness of a localized area  usually over a bony prominence.   10/21/17    0700     85   Wound / Incision (Open or Dehisced) 09/14/17 Diabetic ulcer Heel Left Diabetic ulcer, full thickness tissue loss. beefy red, moderate drainage   09/14/17    2100    Heel   122   Wound / Incision (Open or Dehisced) 10/21/17 Other (Comment) Penis Medial wet/hard scab in mid area, redness, swelling and tenderness   10/21/17    0700    Penis   85          Labs/Imaging Results for orders placed or performed during the hospital encounter of 01/14/18 (from the past 48 hour(s))  CBG  monitoring, ED     Status: Abnormal   Collection Time: 01/14/18  5:32 PM  Result Value Ref Range   Glucose-Capillary >600 (HH) 65 - 99 mg/dL  CBC with Differential (PNL)     Status: Abnormal   Collection Time: 01/14/18  5:54 PM  Result Value Ref Range   WBC 25.7 (H) 4.0 - 10.5 K/uL   RBC 4.22 4.22 - 5.81 MIL/uL   Hemoglobin 12.8 (L) 13.0 - 17.0 g/dL   HCT 38.6 (L) 39.0 - 52.0 %   MCV 91.5 78.0 - 100.0 fL   MCH 30.3 26.0 - 34.0 pg   MCHC 33.2 30.0 - 36.0 g/dL   RDW 13.8 11.5 - 15.5 %   Platelets 561 (H) 150 - 400 K/uL   Neutrophils Relative % 81 %   Lymphocytes Relative 9 %   Monocytes Relative 10 %   Eosinophils Relative 0 %   Basophils Relative 0 %   Neutro Abs 20.8 (H) 1.7 - 7.7 K/uL   Lymphs Abs 2.3 0.7 - 4.0 K/uL   Monocytes Absolute 2.6 (H) 0.1 - 1.0 K/uL   Eosinophils Absolute 0.0 0.0 - 0.7 K/uL   Basophils Absolute 0.0 0.0 - 0.1 K/uL   WBC Morphology WHITE COUNT CONFIRMED ON SMEAR    Smear Review MORPHOLOGY UNREMARKABLE   Comprehensive metabolic panel     Status: Abnormal   Collection Time: 01/14/18  5:54 PM  Result Value Ref Range   Sodium 133 (L) 135 - 145 mmol/L   Potassium 5.7 (H) 3.5 - 5.1 mmol/L   Chloride 99 (L) 101 - 111 mmol/L   CO2 <7 (L) 22 - 32 mmol/L    Comment: REPEATED TO VERIFY   Glucose, Bld 746 (HH) 65 - 99 mg/dL    Comment: CRITICAL RESULT CALLED TO, READ BACK BY AND VERIFIED WITH: K.HANKS AT 1920 ON 01/14/18 BY  N.THOMPSON    BUN 30 (H) 6 - 20 mg/dL   Creatinine, Ser 2.04 (H) 0.61 - 1.24 mg/dL   Calcium 9.6 8.9 - 10.3 mg/dL   Total Protein 6.7 6.5 - 8.1 g/dL   Albumin 4.1 3.5 - 5.0 g/dL   AST 17 15 - 41 U/L   ALT 18 17 - 63 U/L   Alkaline Phosphatase 152 (H) 38 - 126 U/L   Total Bilirubin 1.5 (H) 0.3 - 1.2 mg/dL   GFR calc non Af Amer 41 (L) >60 mL/min   GFR calc Af Amer 48 (L) >60 mL/min    Comment: (NOTE) The eGFR has been calculated using the CKD EPI equation. This calculation has not been validated in all clinical situations. eGFR's persistently <60 mL/min signify possible Chronic Kidney Disease.    Anion gap NOT CALCULATED 5 - 15  I-Stat Chem 8, ED     Status: Abnormal   Collection Time: 01/14/18  6:06 PM  Result Value Ref Range   Sodium 132 (L) 135 - 145 mmol/L   Potassium 5.6 (H) 3.5 - 5.1 mmol/L   Chloride 108 101 - 111 mmol/L   BUN 29 (H) 6 - 20 mg/dL   Creatinine, Ser 1.60 (H) 0.61 - 1.24 mg/dL   Glucose, Bld >700 (HH) 65 - 99 mg/dL   Calcium, Ion 1.34 1.15 - 1.40 mmol/L   TCO2 5 (L) 22 - 32 mmol/L   Hemoglobin 13.9 13.0 - 17.0 g/dL   HCT 41.0 39.0 - 52.0 %   Comment NOTIFIED PHYSICIAN   Blood gas, venous     Status: None (Preliminary result)  Collection Time: 01/14/18  6:15 PM  Result Value Ref Range   pH, Ven BELOW REPORTABLE RANGE 7.250 - 7.430    Comment: CHERRELLE RN BY ROBIN POWELL RRT ON 01/14/18 AT 1817   pCO2, Ven  44.0 - 60.0 mmHg    CRITICAL RESULT CALLED TO, READ BACK BY AND VERIFIED WITH:    Comment: BELOW REPORTABLE RANGE CHERRELLE RN BY ROBIN POWELL RRT ON 01/14/18 AT 1817    pO2, Ven  32.0 - 45.0 mmHg    CRITICAL RESULT CALLED TO, READ BACK BY AND VERIFIED WITH:    Comment: BELOW REPORTABLE RANGE CHERRELLE RN BY ROBIN POWELL RRT ON 01/14/18 AT 1817    Bicarbonate PENDING 20.0 - 28.0 mmol/L   O2 Saturation 75.6 %   Patient temperature 98.6    Collection site DRAWN BY RN    Drawn by [phone]    Sample type VENOUS   CBG monitoring, ED     Status:  Abnormal   Collection Time: 01/14/18  8:16 PM  Result Value Ref Range   Glucose-Capillary 593 (HH) 65 - 99 mg/dL  Blood gas, venous     Status: Abnormal (Preliminary result)   Collection Time: 01/14/18  8:30 PM  Result Value Ref Range   FIO2 PENDING    O2 Content PENDING L/min   pH, Ven 6.875 (LL) 7.250 - 7.430    Comment: CRITICAL RESULT CALLED TO, READ BACK BY AND VERIFIED WITH: Wisdom Rickey, RN AT 2116 ON 01/14/2018 BY PATRICK SWEENEY RRT, RCP    pCO2, Ven 16.0 (LL) 44.0 - 60.0 mmHg    Comment: CRITICAL RESULT CALLED TO, READ BACK BY AND VERIFIED WITH: Terrisha Lopata, RN AT 2016 ON 01/14/2018 BY PATRICK SWEENEY RRT,RCP    pO2, Ven 37.4 32.0 - 45.0 mmHg   Bicarbonate 3.1 (L) 20.0 - 28.0 mmol/L   Acid-Base Excess PENDING 0.0 - 2.0 mmol/L   O2 Saturation 77.3 %   Patient temperature 91.3   Protime-INR     Status: Abnormal   Collection Time: 01/14/18  8:47 PM  Result Value Ref Range   Prothrombin Time 15.9 (H) 11.4 - 15.2 seconds   INR 1.28   APTT     Status: None   Collection Time: 01/14/18  8:47 PM  Result Value Ref Range   aPTT 33 24 - 36 seconds  CBG monitoring, ED     Status: Abnormal   Collection Time: 01/14/18  9:21 PM  Result Value Ref Range   Glucose-Capillary 562 (HH) 65 - 99 mg/dL   Dg Chest Portable 1 View  Result Date: 01/14/2018 CLINICAL DATA:  32 year old male with history of combativeness and lethargy. EXAM: PORTABLE CHEST 1 VIEW COMPARISON:  Chest x-ray 12/10/2017. FINDINGS: Lung volumes are normal. No consolidative airspace disease. No pleural effusions. No pneumothorax. No pulmonary nodule or mass noted. Pulmonary vasculature and the cardiomediastinal silhouette are within normal limits. IMPRESSION: No radiographic evidence of acute cardiopulmonary disease. Electronically Signed   By: Vinnie Langton M.D.   On: 01/14/2018 18:36    Pending Labs Unresulted Labs (From admission, onward)   Start     Ordered   01/14/18 5093  Basic metabolic panel  Now then  every 4 hours,   R     01/14/18 2011   01/14/18 2111  Hemoglobin A1c  Once,   R     01/14/18 2110   01/14/18 2111  Urine rapid drug screen (hosp performed)  STAT,   R     01/14/18 2110  01/14/18 2110  Microalbumin, urine  Once,   R     01/14/18 2110   01/14/18 6045  Basic metabolic panel  Once,   STAT     01/14/18 1927   01/14/18 2015  Urinalysis, Routine w reflex microscopic  STAT,   R     01/14/18 2015   01/14/18 2006  Beta-hydroxybutyric acid  Once,   R     01/14/18 2008   01/14/18 2004  Magnesium  Once,   R     01/14/18 2008   01/14/18 2004  Phosphorus  Once,   R     01/14/18 2008   01/14/18 2003  Procalcitonin  Once,   R     01/14/18 2008   01/14/18 2003  Lactic acid, plasma  STAT Now then every 3 hours,   R     01/14/18 2008   01/14/18 1840  Urine Culture  Once,   STAT     01/14/18 1839   01/14/18 1840  Blood culture (routine x 2)  BLOOD CULTURE X 2,   STAT     01/14/18 1839      Vitals/Pain Today's Vitals   01/14/18 1930 01/14/18 2000 01/14/18 2057 01/14/18 2100  BP: 113/63 126/82 138/89 137/72  Pulse: 83 92 (!) 110 (!) 105  Resp: 20 18 (!) 28 17  Temp:   (!) 91.5 F (33.1 C)   TempSrc:   Rectal   SpO2: 99% 99% 100% 100%    Isolation Precautions No active isolations  Medications Medications  0.9 %  sodium chloride infusion (not administered)  aspirin chewable tablet 324 mg (not administered)  heparin injection 5,000 Units (not administered)  sodium chloride 0.9 % bolus 1,000 mL (not administered)  ondansetron (ZOFRAN) injection 4 mg (not administered)  acetaminophen (TYLENOL) tablet 650 mg (not administered)  insulin regular (NOVOLIN R,HUMULIN R) 100 Units in sodium chloride 0.9 % 100 mL (1 Units/mL) infusion (15.1 Units/hr Intravenous New Bag/Given 01/14/18 2132)  calcium gluconate 1 g in sodium chloride 0.9 % 100 mL IVPB (not administered)  sodium bicarbonate 150 mEq in sterile water 1,000 mL infusion (not administered)  sodium chloride 0.9 % bolus  1,000 mL (0 mLs Intravenous Stopped 01/14/18 2109)    And  sodium chloride 0.9 % bolus 1,000 mL (1,000 mLs Intravenous New Bag/Given 01/14/18 1857)    Mobility non-ambulatory

## 2018-01-15 ENCOUNTER — Other Ambulatory Visit: Payer: Self-pay

## 2018-01-15 ENCOUNTER — Inpatient Hospital Stay (HOSPITAL_COMMUNITY): Payer: Medicare Other

## 2018-01-15 LAB — BLOOD GAS, ARTERIAL
Acid-base deficit: 12.4 mmol/L — ABNORMAL HIGH (ref 0.0–2.0)
Bicarbonate: 12.1 mmol/L — ABNORMAL LOW (ref 20.0–28.0)
DRAWN BY: 225631
Drawn by: 422461
FIO2: 21
FIO2: 21
O2 SAT: 78.3 %
O2 SAT: 97.4 %
PATIENT TEMPERATURE: 93.7
PH ART: 7.209 — AB (ref 7.350–7.450)
Patient temperature: 96.4
RATE: 20 resp/min
pCO2 arterial: 23 mmHg — ABNORMAL LOW (ref 32.0–48.0)
pH, Arterial: 7.334 — ABNORMAL LOW (ref 7.350–7.450)
pO2, Arterial: 39.6 mmHg — CL (ref 83.0–108.0)
pO2, Arterial: 77 mmHg — ABNORMAL LOW (ref 83.0–108.0)

## 2018-01-15 LAB — BASIC METABOLIC PANEL
ANION GAP: 8 (ref 5–15)
ANION GAP: 13 (ref 5–15)
ANION GAP: 18 — AB (ref 5–15)
ANION GAP: 20 — AB (ref 5–15)
ANION GAP: 24 — AB (ref 5–15)
Anion gap: 15 (ref 5–15)
BUN: 19 mg/dL (ref 6–20)
BUN: 22 mg/dL — ABNORMAL HIGH (ref 6–20)
BUN: 25 mg/dL — AB (ref 6–20)
BUN: 28 mg/dL — ABNORMAL HIGH (ref 6–20)
BUN: 30 mg/dL — ABNORMAL HIGH (ref 6–20)
BUN: 30 mg/dL — ABNORMAL HIGH (ref 6–20)
CALCIUM: 8.6 mg/dL — AB (ref 8.9–10.3)
CALCIUM: 8.3 mg/dL — AB (ref 8.9–10.3)
CHLORIDE: 108 mmol/L (ref 101–111)
CHLORIDE: 110 mmol/L (ref 101–111)
CHLORIDE: 111 mmol/L (ref 101–111)
CO2: 13 mmol/L — AB (ref 22–32)
CO2: 15 mmol/L — ABNORMAL LOW (ref 22–32)
CO2: 10 mmol/L — AB (ref 22–32)
CO2: 13 mmol/L — AB (ref 22–32)
CO2: 7 mmol/L — AB (ref 22–32)
CO2: 20 mmol/L — ABNORMAL LOW (ref 22–32)
CREATININE: 1.46 mg/dL — AB (ref 0.61–1.24)
CREATININE: 1.22 mg/dL (ref 0.61–1.24)
CREATININE: 1.09 mg/dL (ref 0.61–1.24)
Calcium: 8.5 mg/dL — ABNORMAL LOW (ref 8.9–10.3)
Calcium: 8.9 mg/dL (ref 8.9–10.3)
Calcium: 9.1 mg/dL (ref 8.9–10.3)
Calcium: 9.2 mg/dL (ref 8.9–10.3)
Chloride: 112 mmol/L — ABNORMAL HIGH (ref 101–111)
Chloride: 111 mmol/L (ref 101–111)
Chloride: 111 mmol/L (ref 101–111)
Creatinine, Ser: 1.45 mg/dL — ABNORMAL HIGH (ref 0.61–1.24)
Creatinine, Ser: 1.67 mg/dL — ABNORMAL HIGH (ref 0.61–1.24)
Creatinine, Ser: 1.82 mg/dL — ABNORMAL HIGH (ref 0.61–1.24)
GFR calc Af Amer: >60 mL/min (ref >60)
GFR calc non Af Amer: 47 mL/min — ABNORMAL LOW (ref >60)
GFR calc non Af Amer: 52 mL/min — ABNORMAL LOW (ref >60)
GFR calc non Af Amer: >60 mL/min (ref >60)
GFR calc non Af Amer: >60 mL/min (ref >60)
GFR, EST AFRICAN AMERICAN: 55 mL/min — AB (ref >60)
GLUCOSE: 187 mg/dL — AB (ref 65–99)
GLUCOSE: 123 mg/dL — AB (ref 65–99)
Glucose, Bld: 108 mg/dL — ABNORMAL HIGH (ref 65–99)
Glucose, Bld: 139 mg/dL — ABNORMAL HIGH (ref 65–99)
Glucose, Bld: 160 mg/dL — ABNORMAL HIGH (ref 65–99)
Glucose, Bld: 327 mg/dL — ABNORMAL HIGH (ref 65–99)
POTASSIUM: 3.1 mmol/L — AB (ref 3.5–5.1)
POTASSIUM: 3.3 mmol/L — AB (ref 3.5–5.1)
Potassium: 4 mmol/L (ref 3.5–5.1)
Potassium: 3.8 mmol/L (ref 3.5–5.1)
Potassium: 3.5 mmol/L (ref 3.5–5.1)
Potassium: 3.2 mmol/L — ABNORMAL LOW (ref 3.5–5.1)
SODIUM: 139 mmol/L (ref 135–145)
SODIUM: 140 mmol/L (ref 135–145)
SODIUM: 142 mmol/L (ref 135–145)
Sodium: 140 mmol/L (ref 135–145)
Sodium: 139 mmol/L (ref 135–145)
Sodium: 139 mmol/L (ref 135–145)

## 2018-01-15 LAB — GLUCOSE, CAPILLARY
GLUCOSE-CAPILLARY: 345 mg/dL — AB (ref 65–99)
GLUCOSE-CAPILLARY: 195 mg/dL — AB (ref 65–99)
GLUCOSE-CAPILLARY: 98 mg/dL (ref 65–99)
GLUCOSE-CAPILLARY: 129 mg/dL — AB (ref 65–99)
Glucose-Capillary: 110 mg/dL — ABNORMAL HIGH (ref 65–99)
Glucose-Capillary: 232 mg/dL — ABNORMAL HIGH (ref 65–99)
Glucose-Capillary: 86 mg/dL (ref 65–99)
Glucose-Capillary: 92 mg/dL (ref 65–99)
Glucose-Capillary: 102 mg/dL — ABNORMAL HIGH (ref 65–99)
Glucose-Capillary: 153 mg/dL — ABNORMAL HIGH (ref 65–99)
Glucose-Capillary: 164 mg/dL — ABNORMAL HIGH (ref 65–99)
Glucose-Capillary: 160 mg/dL — ABNORMAL HIGH (ref 65–99)

## 2018-01-15 LAB — HEMOGLOBIN A1C
HEMOGLOBIN A1C: 9.5 % — AB (ref 4.8–5.6)
MEAN PLASMA GLUCOSE: 225.95 mg/dL

## 2018-01-15 LAB — LACTIC ACID, PLASMA
LACTIC ACID, VENOUS: 2.6 mmol/L — AB (ref 0.5–1.9)
Lactic Acid, Venous: 1.9 mmol/L (ref 0.5–1.9)

## 2018-01-15 LAB — BETA-HYDROXYBUTYRIC ACID: Beta-Hydroxybutyric Acid: >8 mmol/L — ABNORMAL HIGH (ref 0.05–0.27)

## 2018-01-15 LAB — MRSA PCR SCREENING: MRSA BY PCR: NEGATIVE

## 2018-01-15 MED ORDER — INSULIN ASPART 100 UNIT/ML ~~LOC~~ SOLN: 4.0000 [IU] | Freq: Three times a day (TID) | SUBCUTANEOUS | Status: DC

## 2018-01-15 MED ORDER — ORAL CARE MOUTH RINSE
15.0000 mL | Freq: Two times a day (BID) | OROMUCOSAL | Status: DC
  Administered 2018-01-15 (×2): 15 mL via OROMUCOSAL

## 2018-01-15 MED ORDER — LACTATED RINGERS IV BOLUS (SEPSIS)
1000.0000 mL | Freq: Once | INTRAVENOUS | Status: AC
Start: 2018-01-15 — End: 2018-01-15
  Administered 2018-01-15: 1000 mL via INTRAVENOUS

## 2018-01-15 MED ORDER — LACTATED RINGERS IV BOLUS (SEPSIS)
1000.0000 mL | Freq: Once | INTRAVENOUS | Status: AC
  Administered 2018-01-15: 1000 mL via INTRAVENOUS

## 2018-01-15 MED ORDER — POTASSIUM CHLORIDE 10 MEQ/100ML IV SOLN
10.0000 meq | INTRAVENOUS | Status: AC
  Administered 2018-01-15 (×3): 10 meq via INTRAVENOUS
  Filled 2018-01-15: qty 100

## 2018-01-15 MED ORDER — INSULIN GLARGINE 100 UNIT/ML ~~LOC~~ SOLN
10.0000 [IU] | SUBCUTANEOUS | Status: DC
  Filled 2018-01-15: qty 0.1

## 2018-01-15 MED ORDER — INSULIN ASPART 100 UNIT/ML ~~LOC~~ SOLN
2.0000 [IU] | SUBCUTANEOUS | Status: DC
  Administered 2018-01-15: 2 [IU] via SUBCUTANEOUS

## 2018-01-15 MED ORDER — INSULIN GLARGINE 100 UNIT/ML ~~LOC~~ SOLN: 10.0000 [IU] | SUBCUTANEOUS | Status: DC

## 2018-01-15 MED ORDER — VANCOMYCIN HCL 10 G IV SOLR: 1250.0000 mg | INTRAVENOUS | Status: DC

## 2018-01-15 MED ORDER — METOCLOPRAMIDE HCL 5 MG/ML IJ SOLN: 5.0000 mg | Freq: Four times a day (QID) | INTRAMUSCULAR | Status: DC | PRN

## 2018-01-15 MED ORDER — DEXTROSE 10 % IV SOLN: INTRAVENOUS | Status: DC | PRN

## 2018-01-15 MED ORDER — DEXTROSE IN LACTATED RINGERS 5 % IV SOLN
INTRAVENOUS | Status: DC
  Administered 2018-01-15: 100 mL/h via INTRAVENOUS

## 2018-01-15 MED ORDER — DEXTROSE 10 % IV SOLN
INTRAVENOUS | Status: DC | PRN
Start: 2018-01-15 — End: 2018-01-15

## 2018-01-15 MED ORDER — INSULIN ASPART 100 UNIT/ML ~~LOC~~ SOLN: 2.0000 [IU] | SUBCUTANEOUS | Status: DC

## 2018-01-15 MED ORDER — PIPERACILLIN-TAZOBACTAM 3.375 G IVPB
3.3750 g | Freq: Three times a day (TID) | INTRAVENOUS | Status: DC
  Administered 2018-01-15: 3.375 g via INTRAVENOUS
  Filled 2018-01-15: qty 50

## 2018-01-15 MED ORDER — INSULIN GLARGINE 100 UNIT/ML ~~LOC~~ SOLN
10.0000 [IU] | Freq: Every day | SUBCUTANEOUS | Status: DC
  Administered 2018-01-15: 10 [IU] via SUBCUTANEOUS
  Filled 2018-01-15 (×3): qty 0.1

## 2018-01-15 MED ORDER — DEXTROSE-NACL 5-0.9 % IV SOLN
INTRAVENOUS | Status: DC
  Administered 2018-01-15: 03:00:00 via INTRAVENOUS

## 2018-01-15 MED ORDER — POTASSIUM CHLORIDE 10 MEQ/100ML IV SOLN
10.0000 meq | INTRAVENOUS | Status: AC
  Administered 2018-01-15 (×4): 10 meq via INTRAVENOUS
  Filled 2018-01-15: qty 100

## 2018-01-15 MED ORDER — INSULIN ASPART 100 UNIT/ML ~~LOC~~ SOLN
4.0000 [IU] | Freq: Three times a day (TID) | SUBCUTANEOUS | Status: DC
  Administered 2018-01-15 – 2018-01-16 (×4): 4 [IU] via SUBCUTANEOUS

## 2018-01-15 MED ORDER — INSULIN ASPART 100 UNIT/ML ~~LOC~~ SOLN
0.0000 [IU] | Freq: Three times a day (TID) | SUBCUTANEOUS | Status: DC
  Administered 2018-01-15 (×2): 3 [IU] via SUBCUTANEOUS
  Administered 2018-01-16: 11 [IU] via SUBCUTANEOUS
  Administered 2018-01-16: 5 [IU] via SUBCUTANEOUS
  Administered 2018-01-16: 15 [IU] via SUBCUTANEOUS

## 2018-01-15 NOTE — Progress Notes (Signed)
eLink Physician-Brief Progress Note Patient Name: [name] DOB: 22-Feb-1986 MRN: [id number]   Date of Service  01/15/2018  HPI/Events of Note  ABG = 7.20/NA/39/NA - I question if this is really an ABG d/t low pO2 and Sat = 100% by oximetry.   eICU Interventions  Will order: 1. Decrease NaHCO3 IV infusion to 100 mL/hour.  2. Repeat ABG at 5 AM.     Intervention Category Major Interventions: Acid-Base disturbance - evaluation and management  Sommer,Steven Eugene 01/15/2018, 12:57 AM

## 2018-01-15 NOTE — Progress Notes (Signed)
.. ..  Name: JWAN HORNBAKER MRN: [id number] DOB: 1986/08/22    ADMISSION DATE:  01/14/2018 CONSULTATION DATE: 01/14/18  REFERRING MD : Linwood Dibbles MD  CHIEF COMPLAINT: altered mental status, elevated blood sugars  BRIEF PATIENT DESCRIPTION:  32 year old male with past medical history of insulin dept. diabetes mellitus ,rheumatoid arthritis and synovitis ( Dec 2018 MR shoulder) Previous history of DKA, hypertension, noncompliance with medication, schizoaffective schizophrenia, polysubstance abuse, and and anxiety. Presents with blood sugars in the 700s and kussmaul breathing.   SIGNIFICANT EVENTS  VBG- unreadable ph  Cultures  BCX 2 1/29: >>> mrsa 1/29>>>  ABX vanc 1/28>>>1/29 Zosyn 1/28>>1/29  SUBJECTIVE:   still encephalopathic   VITAL SIGNS: Temp:  [90 F (32.2 C)-98.1 F (36.7 C)] 98 F (36.7 C) (01/29 0800) Pulse Rate:  [83-115] 115 (01/29 0800) Resp:  [11-28] 11 (01/29 0800) BP: (88-138)/(51-89) 128/71 (01/29 0800) SpO2:  [99 %-100 %] 100 % (01/29 0800) Weight:  [135 lb 5.8 oz (61.4 kg)] 135 lb 5.8 oz (61.4 kg) (01/28 2152)  Intake/Output Summary (Last 24 hours) at 01/15/2018 1032 Last data filed at 01/15/2018 0800 Gross per 24 hour  Intake 4826.57 ml  Output 2100 ml  Net 2726.57 ml    PHYSICAL EXAMINATION: General: 32 year old white male. Sleepy but more arousable  HENT: NCAT MMM Pulm: clear no accessory use Card: RRR Abd: soft, not tender. + bowel sounds Ext/MS; warm, dry no edema brisk CR Neuro/psych: opens eyes. Sp slurred follows commands   Recent Labs  Lab 01/14/18 2338 01/15/18 0202 01/15/18 0729  NA 142 140 139  K 3.3* 3.1* 4.0  CL 111 110 108  CO2 7* 10* 13*  BUN 30* 30* 28*  CREATININE 1.82* 1.67* 1.46*  GLUCOSE 327* 139* 108*   Recent Labs  Lab 01/14/18 1754 01/14/18 1806  HGB 12.8* 13.9  HCT 38.6* 41.0  WBC 25.7*  --   PLT 561*  --    CBG (last 3)  Recent Labs    01/15/18 0608 01/15/18 0737 01/15/18 0829  GLUCAP 92  102* 129*    Ct Head Wo Contrast  Result Date: 01/14/2018 CLINICAL DATA:  Altered level of consciousness. EXAM: CT HEAD WITHOUT CONTRAST TECHNIQUE: Contiguous axial images were obtained from the base of the skull through the vertex without intravenous contrast. COMPARISON:  CT 10/20/2017.  MRI 12/10/2017. FINDINGS: Brain: No acute intracranial abnormality. Specifically, no hemorrhage, hydrocephalus, mass lesion, acute infarction, or significant intracranial injury. Vascular: No hyperdense vessel or unexpected calcification. Skull: No acute calvarial abnormality. Sinuses/Orbits: Visualized paranasal sinuses and mastoids clear. Orbital soft tissues unremarkable. Other: None IMPRESSION: No acute intracranial abnormality. Electronically Signed   By: Charlett Nose M.D.   On: 01/14/2018 23:18   Dg Chest Portable 1 View  Result Date: 01/14/2018 CLINICAL DATA:  32 year old male with history of combativeness and lethargy. EXAM: PORTABLE CHEST 1 VIEW COMPARISON:  Chest x-ray 12/10/2017. FINDINGS: Lung volumes are normal. No consolidative airspace disease. No pleural effusions. No pneumothorax. No pulmonary nodule or mass noted. Pulmonary vasculature and the cardiomediastinal silhouette are within normal limits. IMPRESSION: No radiographic evidence of acute cardiopulmonary disease. Electronically Signed   By: Trudie Reed M.D.   On: 01/14/2018 18:36    ASSESSMENT / PLAN: DKA 2/2 non-compliance. For most part AG resolved bus still has NAG component. will change IVF to LR. Still encephalopathic but this is metabolic in etiology. Will cont supportive care. Will cont serial chemistries until acidosis improves further Probably go to IM in am (  pending f/u lab draws)  Resolved issues Pseudohyperkalemia   Active list:  Severe AG Metabolic and now normal AG metabolic Acidosis initially in setting of DKA, now w/ NAG component (ratio is <1 but > 0.4) -B-Hydroxybutyric acid: >8 -LA 2.6 to 1.9 Plan Changing IVF  to D5LR (suspect will see evolving hyperchloremia) Continue serial chemistries until bicarb normalized.   Hyperglycemia in setting of DKA -insulin gtt stopped this am d/t glucose dropping fairly rapidly.  Plan Cont lantus Cont ssi   Acute Metabolic encephalopathy c/b underlying schizoaffective history Plan Holding home antipsychotics d/t encephalopathy Cont supportive care Rx/correct  acidosis   At risk for Fluid and electrolyte imbalance: hypokalemia s/p replacement Plan Trend chemistries Replace as indicated  Acute Kidney Injury -->improved. Suspect this was hemodynamically mediated.   Plan Renal dose meds Avoid hypotension Repeat chem in am   Leukocytosis: suspect this is reactive.  Plan Dc abx Trend fever and WBC curve  H/o Gastroparesis Plan Reglan PRN   01/15/2018, 10:02 AM

## 2018-01-15 NOTE — Care Management Note (Signed)
Case Management Note  Patient Details  Name: [name] MRN: [id number] Date of Birth: 07-05-1986  Subjective/Objective:                  dka  Action/Plan: Date:  January 15, 2018 Chart reviewed for concurrent status and case management needs.  Will continue to follow patient progress.  Discharge Planning: following for needs.  None present at this time of review. Expected discharge date: January 18, 2018 Marcelle Smiling, BSN, [place], [place]   [phone]   Expected Discharge Date:  (unknown)               Expected Discharge Plan:  Home/Self Care  In-House Referral:     Discharge planning Services  CM Consult  Post Acute Care Choice:    Choice offered to:     DME Arranged:    DME Agency:     HH Arranged:    HH Agency:     Status of Service:  In process, will continue to follow  If discussed at Long Length of Stay Meetings, dates discussed:    Additional Comments:  Golda Acre, RN 01/15/2018, 9:49 AM

## 2018-01-15 NOTE — Progress Notes (Signed)
Inpatient Diabetes Program Recommendations  AACE/ADA: New Consensus Statement on Inpatient Glycemic Control (2015)  Target Ranges:  Prepandial:   less than 140 mg/dL      Peak postprandial:   less than 180 mg/dL (1-2 hours)      Critically ill patients:  140 - 180 mg/dL   Lab Results  Component Value Date   GLUCAP 129 (H) 01/15/2018   HGBA1C 9.5 (H) 01/14/2018    Review of Glycemic Control  Diabetes history: DM1 (makes no insulin; will require basal, correction, and meal coverage insulin) Outpatient Diabetes medications: Levemir 30 units daily, Novolog 0-15 units QID Current orders for Inpatient glycemic control: IV insulin drip  Order set changed from ICU protocol to DKA protocol per RN.  Inpatient Diabetes Program Recommendations:  Insulin - Basal: Once acidosis is cleared and MD is ready to transition off IV insulin to SQ insulin, please consider ordering Levemir 12 units BID. Correction (SSI): Once acidosis is cleared and MD is ready to transition off IV insulin to SQ insulin, please consider ordering CBGs with Novolog 0-9 units TID with meals and Novolog 0-5 units QHS. Insulin - Meal Coverage: Once acidosis is cleared and MD is ready to transition off IV insulin to SQ insulin, please consider ordering Novolog 3 units TID with meals for meal coverage if patient eats at least 50% of meals.  NOTE: Patient is well known to Inpatient Diabetes team as he has Type 1 DM and this is his 11th admission in the past 6 months. Since patient has Type 1 DM, he will require basal, correction, and meal coverage insulin as he makes no insulin.   Will follow.  Thank you. Ailene Ards, RD, LDN, CDE Inpatient Diabetes Coordinator [phone]

## 2018-01-15 NOTE — Progress Notes (Signed)
Pharmacy Antibiotic Note  [name] is a 32 y.o. male admitted on 01/14/2018 with sepsis.  Pharmacy has been consulted for Vancomycin and Zosyn dosing.  Plan: Zosyn 3.375g IV q8h (4 hour infusion).  Vancomycin 1gm iv x1, then 1250mg  iv q24hr  Goal AUC = 400 - 500 for all indications, except meningitis (goal AUC > 500 and Cmin 15-20 mcg/mL)   Height: 6\' 2"  (188 cm) Weight: 135 lb 5.8 oz (61.4 kg) IBW/kg (Calculated) : 82.2  Temp (24hrs), Avg:92.7 F (33.7 C), Min:90 F (32.2 C), Max:96.4 F (35.8 C)  Recent Labs  Lab 01/14/18 1754 01/14/18 1806 01/14/18 2338  WBC 25.7*  --   --   CREATININE 2.04* 1.60* 1.82*  LATICACIDVEN  --   --  2.6*    Estimated Creatinine Clearance: 50.1 mL/min (A) (by C-G formula based on SCr of 1.82 mg/dL (H)).    Allergies  Allergen Reactions  . Sulfa Antibiotics Other (See Comments)    Unknown childhood allergy  . Fentanyl Other (See Comments)    Patient had hallucinations with the medication    Antimicrobials this admission: Vancomycin 01/14/2018 >> Zosyn 01/14/2018 >>  Dose adjustments this admission: -  Microbiology results: pending  Thank you for allowing pharmacy to be a part of this patient's care.  01/16/2018 [place] 01/15/2018 2:01 AM

## 2018-01-15 NOTE — Progress Notes (Signed)
eLink Physician-Brief Progress Note Patient Name: [name] DOB: 04-06-1986 MRN: [id number]   Date of Service  01/15/2018  HPI/Events of Note  Multiple issues: ABG = 7.34/23.0/77.7/12.1 and 2. Blood glucose = 98.  eICU Interventions  Will order: 1. D/C NaHCO3 IV infusion. 2. Increase D5 0.9 NaCl IV infusion to 125 mL/hour.      Intervention Category Major Interventions: Acid-Base disturbance - evaluation and management;Hyperglycemia - active titration of insulin therapy  Sommer,Steven Dennard Nip 01/15/2018, 5:14 AM

## 2018-01-15 NOTE — Progress Notes (Signed)
eLink Physician-Brief Progress Note Patient Name: MARCELLIUS MONTAGNA DOB: 24-May-1986 MRN: [id number]   Date of Service  01/15/2018  HPI/Events of Note  K+ = 3.3 and Creatinine = 1.82.  eICU Interventions  Will replace K+.     Intervention Category Major Interventions: Electrolyte abnormality - evaluation and management  Redell Nazir Eugene 01/15/2018, 1:04 AM

## 2018-01-15 NOTE — Progress Notes (Signed)
PCCM Interval Progress Note  K 3.1.  Had received 4 runs for K of 3.3 at 0100.  Will add additional 4 runs K now.  Day team to follow up on repeat BMP.   Rutherford Guys, [place] [facility] Pager: [phone]  or [phone] 01/15/2018, 6:41 AM

## 2018-01-15 NOTE — Progress Notes (Signed)
eLink Physician-Brief Progress Note Patient Name: [name] DOB: 1986-01-27 MRN: [id number]   Date of Service  01/15/2018  HPI/Events of Note  Blood glucose = 110.   eICU Interventions  Will order: 1. Decrease NaHCO3 IV infusion to 75 mL/hour. 2. D5 0.9 NaCl to run IV at 75 mL/hour.      Intervention Category Major Interventions: Hyperglycemia - active titration of insulin therapy  Coulton Schlink Dennard Nip 01/15/2018, 2:48 AM

## 2018-01-16 ENCOUNTER — Inpatient Hospital Stay (HOSPITAL_COMMUNITY): Payer: Medicare Other

## 2018-01-16 DIAGNOSIS — L8941 Pressure ulcer of contiguous site of back, buttock and hip, stage 1: Secondary | ICD-10-CM

## 2018-01-16 DIAGNOSIS — J9601 Acute respiratory failure with hypoxia: Secondary | ICD-10-CM

## 2018-01-16 LAB — BASIC METABOLIC PANEL
ANION GAP: 10 (ref 5–15)
Anion gap: 8 (ref 5–15)
Anion gap: 7 (ref 5–15)
Anion gap: 6 (ref 5–15)
BUN: 16 mg/dL (ref 6–20)
BUN: 12 mg/dL (ref 6–20)
BUN: 11 mg/dL (ref 6–20)
BUN: 14 mg/dL (ref 6–20)
CALCIUM: 8.2 mg/dL — AB (ref 8.9–10.3)
CHLORIDE: 111 mmol/L (ref 101–111)
CHLORIDE: 106 mmol/L (ref 101–111)
CO2: 20 mmol/L — ABNORMAL LOW (ref 22–32)
CO2: 26 mmol/L (ref 22–32)
CO2: 21 mmol/L — AB (ref 22–32)
CO2: 21 mmol/L — AB (ref 22–32)
CREATININE: 1.01 mg/dL (ref 0.61–1.24)
CREATININE: 0.91 mg/dL (ref 0.61–1.24)
Calcium: 8.3 mg/dL — ABNORMAL LOW (ref 8.9–10.3)
Calcium: 8.4 mg/dL — ABNORMAL LOW (ref 8.9–10.3)
Calcium: 8.5 mg/dL — ABNORMAL LOW (ref 8.9–10.3)
Chloride: 110 mmol/L (ref 101–111)
Chloride: 105 mmol/L (ref 101–111)
Creatinine, Ser: 1.06 mg/dL (ref 0.61–1.24)
Creatinine, Ser: 0.81 mg/dL (ref 0.61–1.24)
GFR calc Af Amer: >60 mL/min (ref >60)
GFR calc Af Amer: >60 mL/min (ref >60)
GFR calc non Af Amer: >60 mL/min (ref >60)
GFR calc non Af Amer: >60 mL/min (ref >60)
GFR calc non Af Amer: >60 mL/min (ref >60)
GFR calc non Af Amer: >60 mL/min (ref >60)
GLUCOSE: 481 mg/dL — AB (ref 65–99)
Glucose, Bld: 237 mg/dL — ABNORMAL HIGH (ref 65–99)
Glucose, Bld: 305 mg/dL — ABNORMAL HIGH (ref 65–99)
Glucose, Bld: 274 mg/dL — ABNORMAL HIGH (ref 65–99)
POTASSIUM: 3.1 mmol/L — AB (ref 3.5–5.1)
POTASSIUM: 3.1 mmol/L — AB (ref 3.5–5.1)
Potassium: 3.2 mmol/L — ABNORMAL LOW (ref 3.5–5.1)
Potassium: 3.2 mmol/L — ABNORMAL LOW (ref 3.5–5.1)
SODIUM: 138 mmol/L (ref 135–145)
SODIUM: 138 mmol/L (ref 135–145)
SODIUM: 139 mmol/L (ref 135–145)
Sodium: 136 mmol/L (ref 135–145)

## 2018-01-16 LAB — GLUCOSE, CAPILLARY
GLUCOSE-CAPILLARY: 318 mg/dL — AB (ref 65–99)
GLUCOSE-CAPILLARY: 336 mg/dL — AB (ref 65–99)
Glucose-Capillary: 474 mg/dL — ABNORMAL HIGH (ref 65–99)
Glucose-Capillary: 221 mg/dL — ABNORMAL HIGH (ref 65–99)

## 2018-01-16 LAB — ECHOCARDIOGRAM COMPLETE
Height: 74 in
Weight: 2313.95 oz

## 2018-01-16 LAB — PHOSPHORUS: Phosphorus: 1.7 mg/dL — ABNORMAL LOW (ref 2.5–4.6)

## 2018-01-16 LAB — MAGNESIUM: Magnesium: 1.6 mg/dL — ABNORMAL LOW (ref 1.7–2.4)

## 2018-01-16 MED ORDER — TRAZODONE HCL 50 MG PO TABS
50.0000 mg | ORAL_TABLET | Freq: Once | ORAL | Status: AC | PRN
  Administered 2018-01-16: 50 mg via ORAL
  Filled 2018-01-16: qty 1

## 2018-01-16 MED ORDER — METOCLOPRAMIDE HCL 5 MG/ML IJ SOLN: 5.0000 mg | Freq: Four times a day (QID) | INTRAMUSCULAR | Status: DC | PRN

## 2018-01-16 MED ORDER — INSULIN GLARGINE 100 UNIT/ML ~~LOC~~ SOLN
15.0000 [IU] | Freq: Every day | SUBCUTANEOUS | Status: DC
  Filled 2018-01-16: qty 0.15

## 2018-01-16 MED ORDER — INSULIN ASPART 100 UNIT/ML ~~LOC~~ SOLN
0.0000 [IU] | Freq: Three times a day (TID) | SUBCUTANEOUS | Status: DC
  Administered 2018-01-17: 15 [IU] via SUBCUTANEOUS

## 2018-01-16 MED ORDER — DOXEPIN HCL 10 MG PO CAPS
10.0000 mg | ORAL_CAPSULE | Freq: Every day | ORAL | Status: DC
  Filled 2018-01-16: qty 1

## 2018-01-16 MED ORDER — MAGNESIUM OXIDE 400 (241.3 MG) MG PO TABS
400.0000 mg | ORAL_TABLET | Freq: Once | ORAL | Status: AC
  Administered 2018-01-16: 400 mg via ORAL
  Filled 2018-01-16: qty 1

## 2018-01-16 MED ORDER — POTASSIUM CHLORIDE CRYS ER 20 MEQ PO TBCR
40.0000 meq | EXTENDED_RELEASE_TABLET | Freq: Once | ORAL | Status: DC
  Filled 2018-01-16: qty 2

## 2018-01-16 MED ORDER — INSULIN DETEMIR 100 UNIT/ML ~~LOC~~ SOLN
12.0000 [IU] | Freq: Two times a day (BID) | SUBCUTANEOUS | Status: DC
  Filled 2018-01-16: qty 0.12

## 2018-01-16 MED ORDER — INSULIN ASPART 100 UNIT/ML ~~LOC~~ SOLN
0.0000 [IU] | Freq: Every day | SUBCUTANEOUS | Status: DC
  Administered 2018-01-16: 4 [IU] via SUBCUTANEOUS

## 2018-01-16 MED ORDER — METOCLOPRAMIDE HCL 10 MG PO TABS: 10.0000 mg | ORAL_TABLET | Freq: Three times a day (TID) | ORAL | Status: DC | PRN

## 2018-01-16 NOTE — Progress Notes (Addendum)
0800 CBG was 474. MD made aware. No lab draw ordered. Verbal order given to give the patient the "next biggest dose you can give him". As per insulin order, next dose would be for CBG in range of 351-400 and 15 units to be given. 15 units plus AM coverage of 3 units given for total of 18 units.     Pt refuses to put on BP cuff or oxygen probe.

## 2018-01-16 NOTE — Progress Notes (Signed)
  Echocardiogram 2D Echocardiogram has been performed.  [name] F 01/16/2018, 2:19 PM

## 2018-01-16 NOTE — Progress Notes (Signed)
Pt stable for discharge. VSS. Full report given to [name] on [facility].

## 2018-01-16 NOTE — Progress Notes (Signed)
  Echocardiogram 2D Echocardiogram has been performed.  [name] F 01/16/2018, 1:34 PM

## 2018-01-16 NOTE — Progress Notes (Addendum)
PROGRESS NOTE    [name]  GYK:[id number] DOB: [birth year] DOA: 01/14/2018 PCP: Fleet Contras, MD      Brief Narrative:  32 year old male with past medical history of insulin dept. diabetes mellitus ,rheumatoid arthritis and synovitis ( Dec 2018 MR shoulder) Previous history of DKA, hypertension, noncompliance with medication, schizoaffective schizophrenia, polysubstance abuse, and and anxiety. Presents with blood sugars in the 700s and kussmaul breathing.      Assessment & Plan:  Principal Problem:   DKA (diabetic ketoacidoses) ([name]) Active Problems:   Acute kidney injury ([name])   Pressure injury of contiguous region involving back and buttock, stage 1   DKA -Resume home Levemir 15 BID -Mealtime 4 units TID -SSI TID AC and HS   Schizophrenia Patient refuses meds -Hold Adderall -Continue doxepin  Hypokalemia -Supplement K  Acute kidney injury Resolved.  Baseline Cr 1.0, 2.0 on admission.  Stage I pressure injury to sacrum, vs redness from sitting POA.  Mild.   Disposition Plan: Restart home regimen, monitor next 24 hours to ensure does not slip back in to DKA.   Subjective: Patient is hungry.  He feels better.  He has no complaints, no new fever, confusion, vomitin.  Objective: Vitals:   01/16/18 0542 01/16/18 0600 01/16/18 0614 01/16/18 0800  BP:  131/77    Pulse: (!) 109 (!) 110 (!) 111   Resp: 18 14 15    Temp:    97.8 F (36.6 C)  TempSrc:    Oral  SpO2: 93% 100% 99%   Weight:      Height:        Intake/Output Summary (Last 24 hours) at 01/16/2018 1254 Last data filed at 01/16/2018 7939 Gross per 24 hour  Intake 4603.33 ml  Output 1850 ml  Net 2753.33 ml   Filed Weights   01/14/18 2152 01/15/18 2213 01/16/18 0355  Weight: 61.4 kg (135 lb 5.8 oz) 65.1 kg (143 lb 8.3 oz) 65.6 kg (144 lb 10 oz)    Examination: General appearance:  adult male, alert and in no acute distress.    Appears tired. HEENT: Anicteric, conjunctiva pink, lids  and lashes normal. No nasal deformity, discharge, epistaxis.  Lips dry.   Skin: Warm and dry.   No suspicious rashes or lesions. Cardiac: RRR, nl S1-S2, no murmurs appreciated.  Capillary refill is brisk.  JVP normal.  No LE edema.   Respiratory: Normal respiratory rate and rhythm.  CTAB without rales or wheezes. Abdomen: Abdomen soft.  No TTP. MSK: No deformities or effusions. Thin Neuro: Awake and alert.  EOMI, moves all extremities. Speech fluent.    Psych: Sensorium intact and responding to questions, attention normal. Affect blunted.     Data Reviewed: I have personally reviewed following labs and imaging studies:  CBC: Recent Labs  Lab 01/14/18 1754 01/14/18 1806  WBC 25.7*  --   NEUTROABS 20.8*  --   HGB 12.8* 13.9  HCT 38.6* 41.0  MCV 91.5  --   PLT 561*  --    Basic Metabolic Panel: Recent Labs  Lab 01/14/18 2047  01/15/18 1656 01/15/18 2109 01/16/18 0058 01/16/18 0503 01/16/18 0912  NA  --    < > 139 139 138 139 136  K  --    < > 3.5 3.2* 3.2* 3.2* 3.1*  CL  --    < > 111 111 110 111 105  CO2  --    < > 15* 20* 20* 21* 21*  GLUCOSE  --    < >  187* 123* 237* 305* 481*  BUN  --    < > 22* 19 16 14 12   CREATININE  --    < > 1.22 1.09 1.01 0.91 1.06  CALCIUM  --    < > 8.3* 8.5* 8.2* 8.3* 8.4*  MG 2.3  --   --   --   --  1.6*  --   PHOS 7.3*  --   --   --   --  1.7*  --    < > = values in this interval not displayed.   GFR: Estimated Creatinine Clearance: 92 mL/min (by C-G formula based on SCr of 1.06 mg/dL). Liver Function Tests: Recent Labs  Lab 01/14/18 1754  AST 17  ALT 18  ALKPHOS 152*  BILITOT 1.5*  PROT 6.7  ALBUMIN 4.1   No results for input(s): LIPASE, AMYLASE in the last 168 hours. No results for input(s): AMMONIA in the last 168 hours. Coagulation Profile: Recent Labs  Lab 01/14/18 2047  INR 1.28   Cardiac Enzymes: No results for input(s): CKTOTAL, CKMB, CKMBINDEX, TROPONINI in the last 168 hours. BNP (last 3 results) No results  for input(s): PROBNP in the last 8760 hours. HbA1C: Recent Labs    01/14/18 2338  HGBA1C 9.5*   CBG: Recent Labs  Lab 01/15/18 1123 01/15/18 1536 01/15/18 2212 01/16/18 0810 01/16/18 1147  GLUCAP 153* 164* 160* 474* 221*   Lipid Profile: No results for input(s): CHOL, HDL, LDLCALC, TRIG, CHOLHDL, LDLDIRECT in the last 72 hours. Thyroid Function Tests: No results for input(s): TSH, T4TOTAL, FREET4, T3FREE, THYROIDAB in the last 72 hours. Anemia Panel: No results for input(s): VITAMINB12, FOLATE, FERRITIN, TIBC, IRON, RETICCTPCT in the last 72 hours. Urine analysis:    Component Value Date/Time   COLORURINE STRAW (A) 12/10/2017 0133   APPEARANCEUR CLEAR 12/10/2017 0133   LABSPEC 1.011 12/10/2017 0133   PHURINE 5.0 12/10/2017 0133   GLUCOSEU >=500 (A) 12/10/2017 0133   HGBUR SMALL (A) 12/10/2017 0133   BILIRUBINUR NEGATIVE 12/10/2017 0133   BILIRUBINUR negative 03/29/2015 1114   KETONESUR 80 (A) 12/10/2017 0133   PROTEINUR 30 (A) 12/10/2017 0133   UROBILINOGEN 0.2 05/17/2015 1810   NITRITE NEGATIVE 12/10/2017 0133   LEUKOCYTESUR NEGATIVE 12/10/2017 0133   Sepsis Labs: @LABRCNTIP (procalcitonin:4,lacticacidven:4)  ) Recent Results (from the past 240 hour(s))  Blood culture (routine x 2)     Status: None (Preliminary result)   Collection Time: 01/14/18  6:44 PM  Result Value Ref Range Status   Specimen Description BLOOD RIGHT ANTECUBITAL  Final   Special Requests   Final    BOTTLES DRAWN AEROBIC AND ANAEROBIC Blood Culture adequate volume   Culture   Final    NO GROWTH < 12 HOURS Performed at [facility], [address], [place], [address] [place]    Report Status PENDING  Incomplete  MRSA PCR Screening     Status: None   Collection Time: 01/15/18  8:21 AM  Result Value Ref Range Status   MRSA by PCR NEGATIVE NEGATIVE Final    Comment:        The GeneXpert MRSA Assay (FDA approved for NASAL specimens only), is one component of a comprehensive MRSA  colonization surveillance program. It is not intended to diagnose MRSA infection nor to guide or monitor treatment for MRSA infections.          Radiology Studies: Ct Head Wo Contrast  Result Date: 01/14/2018 CLINICAL DATA:  Altered level of consciousness. EXAM: CT HEAD  WITHOUT CONTRAST TECHNIQUE: Contiguous axial images were obtained from the base of the skull through the vertex without intravenous contrast. COMPARISON:  CT 10/20/2017.  MRI 12/10/2017. FINDINGS: Brain: No acute intracranial abnormality. Specifically, no hemorrhage, hydrocephalus, mass lesion, acute infarction, or significant intracranial injury. Vascular: No hyperdense vessel or unexpected calcification. Skull: No acute calvarial abnormality. Sinuses/Orbits: Visualized paranasal sinuses and mastoids clear. Orbital soft tissues unremarkable. Other: None IMPRESSION: No acute intracranial abnormality. Electronically Signed   By: Charlett Nose M.D.   On: 01/14/2018 23:18   Dg Chest Portable 1 View  Result Date: 01/14/2018 CLINICAL DATA:  32 year old male with history of combativeness and lethargy. EXAM: PORTABLE CHEST 1 VIEW COMPARISON:  Chest x-ray 12/10/2017. FINDINGS: Lung volumes are normal. No consolidative airspace disease. No pleural effusions. No pneumothorax. No pulmonary nodule or mass noted. Pulmonary vasculature and the cardiomediastinal silhouette are within normal limits. IMPRESSION: No radiographic evidence of acute cardiopulmonary disease. Electronically Signed   By: Trudie Reed M.D.   On: 01/14/2018 18:36        Scheduled Meds: . doxepin  10 mg Oral QHS  . heparin  5,000 Units Subcutaneous Q8H  . insulin aspart  0-15 Units Subcutaneous TID WC  . insulin aspart  4 Units Subcutaneous TID WC  . insulin detemir  12 Units Subcutaneous BID  . mouth rinse  15 mL Mouth Rinse BID  . potassium chloride  40 mEq Oral Once   Continuous Infusions:   LOS: 2 days    Time spent: 30 minutes    Alberteen Sam, MD [facility] 01/16/2018, 12:54 PM     Pager [phone] --- please page though AMION:  [email or web address] Password [email or web address] If 7PM-7AM, please contact night-coverage

## 2018-01-17 LAB — BLOOD GAS, VENOUS
BICARBONATE: 3.1 mmol/L — AB (ref 20.0–28.0)
Bicarbonate: 3.1 mmol/L — ABNORMAL LOW (ref 20.0–28.0)
Drawn by: 257881
FIO2: 21
O2 SAT: 75.6 %
O2 SAT: 77.3 %
PCO2 VEN: 16 mmHg — AB (ref 44.0–60.0)
PH VEN: 6.875 — AB (ref 7.250–7.430)
Patient temperature: 98.6
Patient temperature: 91.3
pO2, Ven: 37.4 mmHg (ref 32.0–45.0)

## 2018-01-17 LAB — GLUCOSE, CAPILLARY
GLUCOSE-CAPILLARY: 564 mg/dL — AB (ref 65–99)
GLUCOSE-CAPILLARY: 357 mg/dL — AB (ref 65–99)

## 2018-01-17 MED ORDER — INSULIN ASPART 100 UNIT/ML IV SOLN
18.0000 [IU] | Freq: Once | INTRAVENOUS | Status: AC
  Administered 2018-01-17: 18 [IU] via SUBCUTANEOUS

## 2018-01-17 MED ORDER — INSULIN DETEMIR 100 UNIT/ML ~~LOC~~ SOLN
15.0000 [IU] | Freq: Two times a day (BID) | SUBCUTANEOUS | Status: DC
  Administered 2018-01-17: 15 [IU] via SUBCUTANEOUS
  Filled 2018-01-17 (×3): qty 0.15

## 2018-01-17 MED ORDER — INSULIN DETEMIR 100 UNIT/ML ~~LOC~~ SOLN: 18.0000 [IU] | Freq: Two times a day (BID) | SUBCUTANEOUS | 3 refills | Status: DC

## 2018-01-17 MED ORDER — INSULIN ASPART 100 UNIT/ML ~~LOC~~ SOLN: SUBCUTANEOUS | 3 refills | Status: AC

## 2018-01-17 MED ORDER — INSULIN ASPART 100 UNIT/ML ~~LOC~~ SOLN
6.0000 [IU] | Freq: Three times a day (TID) | SUBCUTANEOUS | Status: DC
  Administered 2018-01-17: 6 [IU] via SUBCUTANEOUS

## 2018-01-17 NOTE — Discharge Summary (Signed)
Physician Discharge Summary  [name] HQI:[id number] DOB: Jun 13, 1986 DOA: 01/14/2018  PCP: Fleet Contras, MD  Admit date: 01/14/2018 Discharge date: 01/17/2018  Admitted From: Home 1. Disposition:home 2. Follow up with PCP in 1-2 weeks 3. Please obtain BMP/CBC in one week  Home Health none Equipment/Devices none Discharge Condition:stable CODE STATUS ful Diet recommendation carb modified diet :Brief/Interim Summary:32 year old male with past medical history of insulin dept. diabetes mellitus ,rheumatoid arthritis and synovitis ( Dec 2018 MR shoulder) Previous history of DKA, hypertension, noncompliance with medication, schizoaffective schizophrenia, polysubstance abuse, and and anxiety. Presents with blood sugars in the 700s and kussmaul breathing.    01/17/2018-patient was adamant about going to go home today.  He told me he does not have insurance and there was no way to get his insulin.  Case manager was consulted and found that he does have private insurance and he will just need to have his insurance cover his medications.  He refused lab work today.   Discharge Diagnoses:  Principal Problem:   DKA (diabetic ketoacidoses) ([name]) Active Problems:   Acute kidney injury ([name])   Pressure injury of contiguous region involving back and buttock, stage 1 1] diabetic ketoacidosis secondary to noncompliance-he will be discharged on Levemir 18 units twice a day with NovoLog 6 units 3 times a day before meals and sliding scale insulin.  Patient reported that he lives at home with roommate.  2] history of schizophrenia continue doxepin follow-up with the psychiatry.   Discharge Instructions  Discharge Instructions    Call MD for:  difficulty breathing, headache or visual disturbances   Complete by:  As directed    Call MD for:  extreme fatigue   Complete by:  As directed    Call MD for:  persistant dizziness or light-headedness   Complete by:  As directed    Call MD for:   persistant nausea and vomiting   Complete by:  As directed    Call MD for:  redness, tenderness, or signs of infection (pain, swelling, redness, odor or green/yellow discharge around incision site)   Complete by:  As directed    Call MD for:  severe uncontrolled pain   Complete by:  As directed    Call MD for:  temperature >100.4   Complete by:  As directed    Diet - low sodium heart healthy   Complete by:  As directed    Increase activity slowly   Complete by:  As directed      Allergies as of 01/17/2018      Reactions   Sulfa Antibiotics Other (See Comments)   Unknown childhood allergy   Fentanyl Other (See Comments)   Patient had hallucinations with the medication      Medication List    STOP taking these medications   diclofenac sodium 1 % Gel Commonly known as:  VOLTAREN   furosemide 20 MG tablet Commonly known as:  LASIX     TAKE these medications   amphetamine-dextroamphetamine 30 MG tablet Commonly known as:  ADDERALL Take 30 mg daily by mouth.   doxepin 10 MG/ML solution Commonly known as:  SINEQUAN Take 10 mg by mouth at bedtime.   insulin aspart 100 UNIT/ML FlexPen Commonly known as:  NOVOLOG FLEXPEN Inject 0-15 Units into the skin See admin instructions. Check Blood Sugar 4 times per day > with meals and at bedtime CBG 70 - 120: 0 units CBG 121 - 150: 2 units CBG 151 - 200: 3 units CBG 201 -  250: 5 units CBG 251 - 300: 8 units CBG 301 - 350: 11 units CBG 351 - 400: 15 units What changed:  Another medication with the same name was added. Make sure you understand how and when to take each.   insulin aspart 100 UNIT/ML injection Commonly known as:  NOVOLOG Take 6 units three times daily just before meals. What changed:  You were already taking a medication with the same name, and this prescription was added. Make sure you understand how and when to take each.   insulin detemir 100 UNIT/ML injection Commonly known as:  LEVEMIR Inject 0.18 mLs (18  Units total) into the skin 2 (two) times daily. What changed:  how much to take   metoCLOPramide 10 MG tablet Commonly known as:  REGLAN Take 1 tablet (10 mg total) by mouth 3 (three) times daily as needed for nausea or vomiting.   mupirocin cream 2 % Commonly known as:  BACTROBAN Apply topically daily. Apply Bactroban to left heel wound Q day, then cover with foam dressing.  (Change foam dressing Q 3 days or PRN soiling.)   nicotine 14 mg/24hr patch Commonly known as:  NICODERM CQ - dosed in mg/24 hours Place 1 patch (14 mg total) onto the skin daily.   pantoprazole 40 MG tablet Commonly known as:  PROTONIX Take 1 tablet (40 mg total) by mouth 2 (two) times daily before a meal.   sennosides-docusate sodium 8.6-50 MG tablet Commonly known as:  SENOKOT-S Take 1 tablet by mouth as needed for constipation.   traZODone 150 MG tablet Commonly known as:  DESYREL Take 150 mg by mouth at bedtime.       Allergies  Allergen Reactions  . Sulfa Antibiotics Other (See Comments)    Unknown childhood allergy  . Fentanyl Other (See Comments)    Patient had hallucinations with the medication    Consultations:  Patient was admitted to P CCM diabetic ketoacidosis.   Procedures/Studies: Ct Head Wo Contrast  Result Date: 01/14/2018 CLINICAL DATA:  Altered level of consciousness. EXAM: CT HEAD WITHOUT CONTRAST TECHNIQUE: Contiguous axial images were obtained from the base of the skull through the vertex without intravenous contrast. COMPARISON:  CT 10/20/2017.  MRI 12/10/2017. FINDINGS: Brain: No acute intracranial abnormality. Specifically, no hemorrhage, hydrocephalus, mass lesion, acute infarction, or significant intracranial injury. Vascular: No hyperdense vessel or unexpected calcification. Skull: No acute calvarial abnormality. Sinuses/Orbits: Visualized paranasal sinuses and mastoids clear. Orbital soft tissues unremarkable. Other: None IMPRESSION: No acute intracranial abnormality.  Electronically Signed   By: Charlett Nose M.D.   On: 01/14/2018 23:18   Dg Chest Portable 1 View  Result Date: 01/14/2018 CLINICAL DATA:  32 year old male with history of combativeness and lethargy. EXAM: PORTABLE CHEST 1 VIEW COMPARISON:  Chest x-ray 12/10/2017. FINDINGS: Lung volumes are normal. No consolidative airspace disease. No pleural effusions. No pneumothorax. No pulmonary nodule or mass noted. Pulmonary vasculature and the cardiomediastinal silhouette are within normal limits. IMPRESSION: No radiographic evidence of acute cardiopulmonary disease. Electronically Signed   By: Trudie Reed M.D.   On: 01/14/2018 18:36    (Echo, Carotid, EGD, Colonoscopy, ERCP)    Subjective:   Discharge Exam: Vitals:   01/16/18 1618 01/16/18 2045  BP: (!) 152/84 133/82  Pulse: (!) 101 (!) 106  Resp: 16 20  Temp: 98.6 F (37 C) 98.2 F (36.8 C)  SpO2: 100% 100%   Vitals:   01/16/18 0614 01/16/18 0800 01/16/18 1618 01/16/18 2045  BP:   (!) 152/84 133/82  Pulse: (!) 111  (!) 101 (!) 106  Resp: 15  16 20   Temp:  97.8 F (36.6 C) 98.6 F (37 C) 98.2 F (36.8 C)  TempSrc:  Oral Oral Oral  SpO2: 99%  100% 100%  Weight:      Height:        General: Pt is alert, awake, not in acute distress Cardiovascular: RRR, S1/S2 +, no rubs, no gallops Respiratory: CTA bilaterally, no wheezing, no rhonchi Abdominal: Soft, NT, ND, bowel sounds + Extremities: no edema, no cyanosis    The results of significant diagnostics from this hospitalization (including imaging, microbiology, ancillary and laboratory) are listed below for reference.     Microbiology: Recent Results (from the past 240 hour(s))  Blood culture (routine x 2)     Status: None (Preliminary result)   Collection Time: 01/14/18  6:44 PM  Result Value Ref Range Status   Specimen Description BLOOD RIGHT ANTECUBITAL  Final   Special Requests   Final    BOTTLES DRAWN AEROBIC AND ANAEROBIC Blood Culture adequate volume   Culture    Final    NO GROWTH 3 DAYS Performed at [facility], [address], [place], [place] [address]    Report Status PENDING  Incomplete  Blood culture (routine x 2)     Status: None (Preliminary result)   Collection Time: 01/14/18 11:38 PM  Result Value Ref Range Status   Specimen Description BLOOD  Final   Special Requests NONE  Final   Culture   Final    NO GROWTH 2 DAYS Performed at [facility], [address], [place], [place] [address]    Report Status PENDING  Incomplete  MRSA PCR Screening     Status: None   Collection Time: 01/15/18  8:21 AM  Result Value Ref Range Status   MRSA by PCR NEGATIVE NEGATIVE Final    Comment:        The GeneXpert MRSA Assay (FDA approved for NASAL specimens only), is one component of a comprehensive MRSA colonization surveillance program. It is not intended to diagnose MRSA infection nor to guide or monitor treatment for MRSA infections.      Labs: BNP (last 3 results) No results for input(s): BNP in the last 8760 hours. Basic Metabolic Panel: Recent Labs  Lab 01/14/18 2047  01/15/18 2109 01/16/18 0058 01/16/18 0503 01/16/18 0912 01/16/18 1233  NA  --    < > 139 138 139 136 138  K  --    < > 3.2* 3.2* 3.2* 3.1* 3.1*  CL  --    < > 111 110 111 105 106  CO2  --    < > 20* 20* 21* 21* 26  GLUCOSE  --    < > 123* 237* 305* 481* 274*  BUN  --    < > 19 16 14 12 11   CREATININE  --    < > 1.09 1.01 0.91 1.06 0.81  CALCIUM  --    < > 8.5* 8.2* 8.3* 8.4* 8.5*  MG 2.3  --   --   --  1.6*  --   --   PHOS 7.3*  --   --   --  1.7*  --   --    < > = values in this interval not displayed.   Liver Function Tests: Recent Labs  Lab 01/14/18 1754  AST 17  ALT 18  ALKPHOS 152*  BILITOT 1.5*  PROT 6.7  ALBUMIN 4.1   No results for input(s): LIPASE, AMYLASE in the last 168 hours. No results for input(s): AMMONIA in the last 168 hours. CBC: Recent Labs  Lab 01/14/18 1754 01/14/18 1806  WBC 25.7*  --   NEUTROABS 20.8*   --   HGB 12.8* 13.9  HCT 38.6* 41.0  MCV 91.5  --   PLT 561*  --    Cardiac Enzymes: No results for input(s): CKTOTAL, CKMB, CKMBINDEX, TROPONINI in the last 168 hours. BNP: Invalid input(s): POCBNP CBG: Recent Labs  Lab 01/16/18 1147 01/16/18 1745 01/16/18 2042 01/17/18 0725 01/17/18 1136  GLUCAP 221* 318* 336* 564* 357*   D-Dimer No results for input(s): DDIMER in the last 72 hours. Hgb A1c Recent Labs    01/14/18 2338  HGBA1C 9.5*   Lipid Profile No results for input(s): CHOL, HDL, LDLCALC, TRIG, CHOLHDL, LDLDIRECT in the last 72 hours. Thyroid function studies No results for input(s): TSH, T4TOTAL, T3FREE, THYROIDAB in the last 72 hours.  Invalid input(s): FREET3 Anemia work up No results for input(s): VITAMINB12, FOLATE, FERRITIN, TIBC, IRON, RETICCTPCT in the last 72 hours. Urinalysis    Component Value Date/Time   COLORURINE STRAW (A) 12/10/2017 0133   APPEARANCEUR CLEAR 12/10/2017 0133   LABSPEC 1.011 12/10/2017 0133   PHURINE 5.0 12/10/2017 0133   GLUCOSEU >=500 (A) 12/10/2017 0133   HGBUR SMALL (A) 12/10/2017 0133   BILIRUBINUR NEGATIVE 12/10/2017 0133   BILIRUBINUR negative 03/29/2015 1114   KETONESUR 80 (A) 12/10/2017 0133   PROTEINUR 30 (A) 12/10/2017 0133   UROBILINOGEN 0.2 05/17/2015 1810   NITRITE NEGATIVE 12/10/2017 0133   LEUKOCYTESUR NEGATIVE 12/10/2017 0133   Sepsis Labs Invalid input(s): PROCALCITONIN,  WBC,  LACTICIDVEN Microbiology Recent Results (from the past 240 hour(s))  Blood culture (routine x 2)     Status: None (Preliminary result)   Collection Time: 01/14/18  6:44 PM  Result Value Ref Range Status   Specimen Description BLOOD RIGHT ANTECUBITAL  Final   Special Requests   Final    BOTTLES DRAWN AEROBIC AND ANAEROBIC Blood Culture adequate volume   Culture   Final    NO GROWTH 3 DAYS Performed at [facility], [address], [place], [place] [address]    Report Status PENDING  Incomplete  Blood culture (routine x  2)     Status: None (Preliminary result)   Collection Time: 01/14/18 11:38 PM  Result Value Ref Range Status   Specimen Description BLOOD  Final   Special Requests NONE  Final   Culture   Final    NO GROWTH 2 DAYS Performed at [facility], [address] [place], [place] [address]    Report Status PENDING  Incomplete  MRSA PCR Screening     Status: None   Collection Time: 01/15/18  8:21 AM  Result Value Ref Range Status   MRSA by PCR NEGATIVE NEGATIVE Final    Comment:        The GeneXpert MRSA Assay (FDA approved for NASAL specimens only), is one component of a comprehensive MRSA colonization surveillance program. It is not intended to diagnose MRSA infection nor to guide or monitor treatment for MRSA infections.      Time coordinating discharge: Over 30 minutes  SIGNED:   Alwyn Ren, MD  [facility] 01/17/2018, 2:02 PM Pager   If 7PM-7AM, please contact night-coverage [email or web address] Password [email or web address]

## 2018-01-17 NOTE — Progress Notes (Signed)
Date: January 17 2018 Chart review for discharge needs:  None found for case management. Patient has no questions concerning post hospital care. Rhonda Davis, BSN, RN3, CCM [phone] 

## 2018-01-17 NOTE — Progress Notes (Signed)
Lab called and told RN that pt refused to have his labs drawn. RN went to speak with pt and educated pt on the importance of having his lab drawn. Pt continues to refuse. RN will notify MD via Text page.

## 2018-01-17 NOTE — Progress Notes (Signed)
Date: January 17 2018 Case management consult for medication needs.  Patient has private insurance therefore is not elig. For med. Assistance. Will give good rx card. Marcelle Smiling, BSN, [place], [place] [phone]

## 2018-01-17 NOTE — Care Management Important Message (Signed)
Important Message  Patient Details  Name: EMMETT BRACKNELL MRN: [id number] Date of Birth: [birth year]   Medicare Important Message Given:  Yes    Caren Macadam 01/17/2018, 11:17 AMImportant Message  Patient Details  Name: ENNIO HOUP MRN: [id number] Date of Birth: 1986-06-22   Medicare Important Message Given:  Yes    Caren Macadam 01/17/2018, 11:17 AM

## 2018-01-17 NOTE — Progress Notes (Signed)
LCSW provided patient with taxi voucher at dc.    Beulah Gandy [place] CSW [phone]

## 2018-01-17 NOTE — Progress Notes (Signed)
Patient verbally abusive at 2100 after encouraging patient to assist with ADL's.  Unable to verbally deescalate, subsequently called security. Patient responded well, no further incidents noted.

## 2018-01-17 NOTE — Progress Notes (Signed)
Nsg Discharge Note  Admit Date:  01/14/2018 Discharge date: 01/17/2018   Reather Laurence to be D/C'd Home per MD order.  AVS completed. Patient signed, timed and dated. Patient/caregiver able to verbalize understanding. No questions or concerns voiced when asked.   Discharge Medication: Allergies as of 01/17/2018      Reactions   Sulfa Antibiotics Other (See Comments)   Unknown childhood allergy   Fentanyl Other (See Comments)   Patient had hallucinations with the medication      Medication List    STOP taking these medications   diclofenac sodium 1 % Gel Commonly known as:  VOLTAREN   furosemide 20 MG tablet Commonly known as:  LASIX     TAKE these medications   amphetamine-dextroamphetamine 30 MG tablet Commonly known as:  ADDERALL Take 30 mg daily by mouth.   doxepin 10 MG/ML solution Commonly known as:  SINEQUAN Take 10 mg by mouth at bedtime.   insulin aspart 100 UNIT/ML FlexPen Commonly known as:  NOVOLOG FLEXPEN Inject 0-15 Units into the skin See admin instructions. Check Blood Sugar 4 times per day > with meals and at bedtime CBG 70 - 120: 0 units CBG 121 - 150: 2 units CBG 151 - 200: 3 units CBG 201 - 250: 5 units CBG 251 - 300: 8 units CBG 301 - 350: 11 units CBG 351 - 400: 15 units What changed:  Another medication with the same name was added. Make sure you understand how and when to take each.   insulin aspart 100 UNIT/ML injection Commonly known as:  NOVOLOG Take 6 units three times daily just before meals. What changed:  You were already taking a medication with the same name, and this prescription was added. Make sure you understand how and when to take each.   insulin detemir 100 UNIT/ML injection Commonly known as:  LEVEMIR Inject 0.18 mLs (18 Units total) into the skin 2 (two) times daily. What changed:  how much to take   metoCLOPramide 10 MG tablet Commonly known as:  REGLAN Take 1 tablet (10 mg total) by mouth 3 (three) times daily as  needed for nausea or vomiting.   mupirocin cream 2 % Commonly known as:  BACTROBAN Apply topically daily. Apply Bactroban to left heel wound Q day, then cover with foam dressing.  (Change foam dressing Q 3 days or PRN soiling.)   nicotine 14 mg/24hr patch Commonly known as:  NICODERM CQ - dosed in mg/24 hours Place 1 patch (14 mg total) onto the skin daily.   pantoprazole 40 MG tablet Commonly known as:  PROTONIX Take 1 tablet (40 mg total) by mouth 2 (two) times daily before a meal.   sennosides-docusate sodium 8.6-50 MG tablet Commonly known as:  SENOKOT-S Take 1 tablet by mouth as needed for constipation.   traZODone 150 MG tablet Commonly known as:  DESYREL Take 150 mg by mouth at bedtime.       Discharge Assessment: Vitals:   01/16/18 1618 01/16/18 2045  BP: (!) 152/84 133/82  Pulse: (!) 101 (!) 106  Resp: 16 20  Temp: 98.6 F (37 C) 98.2 F (36.8 C)  SpO2: 100% 100%   Patient pulled his IV catheter out, catheter intact.No bleeding noted, pt refused to have dressing applied.   D/c Instructions-Education: Discharge instructions given to patient with verbalized understanding. D/c education completed with patient including follow up instructions, medication list, d/c activities limitations if indicated, with other d/c instructions as indicated by MD - patient  able to verbalize understanding, all questions fully answered. Patient instructed to return to ED, call 911, or call MD for any changes in condition.  Patient escorted to the emergency entrance by two security guards, who has his taxi voucher in hand to give to driver, to take pt home.   Tobin Chad, RN 01/17/2018 3:04 PM

## 2018-01-19 LAB — CULTURE, BLOOD (ROUTINE X 2)
Culture: NO GROWTH
Special Requests: ADEQUATE

## 2018-01-20 ENCOUNTER — Inpatient Hospital Stay (HOSPITAL_COMMUNITY)
Admission: EM | Admit: 2018-01-20 | Discharge: 2018-02-02 | DRG: 853 | Disposition: A | Discharge status: Home / Self Care | Payer: Medicare Other | Attending: Internal Medicine | Admitting: Internal Medicine

## 2018-01-20 ENCOUNTER — Emergency Department (HOSPITAL_COMMUNITY): Payer: Medicare Other

## 2018-01-20 ENCOUNTER — Encounter (HOSPITAL_COMMUNITY): Payer: Self-pay | Admitting: Emergency Medicine

## 2018-01-20 DIAGNOSIS — J9601 Acute respiratory failure with hypoxia: Secondary | ICD-10-CM | POA: Diagnosis present

## 2018-01-20 DIAGNOSIS — R918 Other nonspecific abnormal finding of lung field: Secondary | ICD-10-CM

## 2018-01-20 DIAGNOSIS — K083 Retained dental root: Secondary | ICD-10-CM | POA: Diagnosis present

## 2018-01-20 DIAGNOSIS — Z9119 Patient's noncompliance with other medical treatment and regimen: Secondary | ICD-10-CM

## 2018-01-20 DIAGNOSIS — K029 Dental caries, unspecified: Secondary | ICD-10-CM | POA: Diagnosis present

## 2018-01-20 DIAGNOSIS — R490 Dysphonia: Secondary | ICD-10-CM | POA: Diagnosis not present

## 2018-01-20 DIAGNOSIS — M419 Scoliosis, unspecified: Secondary | ICD-10-CM | POA: Diagnosis present

## 2018-01-20 DIAGNOSIS — E108 Type 1 diabetes mellitus with unspecified complications: Secondary | ICD-10-CM | POA: Diagnosis not present

## 2018-01-20 DIAGNOSIS — E1010 Type 1 diabetes mellitus with ketoacidosis without coma: Secondary | ICD-10-CM | POA: Diagnosis present

## 2018-01-20 DIAGNOSIS — Z9114 Patient's other noncompliance with medication regimen: Secondary | ICD-10-CM | POA: Diagnosis not present

## 2018-01-20 DIAGNOSIS — F17210 Nicotine dependence, cigarettes, uncomplicated: Secondary | ICD-10-CM | POA: Diagnosis present

## 2018-01-20 DIAGNOSIS — M270 Developmental disorders of jaws: Secondary | ICD-10-CM | POA: Diagnosis not present

## 2018-01-20 DIAGNOSIS — Z681 Body mass index (BMI) 19 or less, adult: Secondary | ICD-10-CM | POA: Diagnosis not present

## 2018-01-20 DIAGNOSIS — I2690 Septic pulmonary embolism without acute cor pulmonale: Secondary | ICD-10-CM | POA: Diagnosis present

## 2018-01-20 DIAGNOSIS — L97301 Non-pressure chronic ulcer of unspecified ankle limited to breakdown of skin: Secondary | ICD-10-CM | POA: Diagnosis not present

## 2018-01-20 DIAGNOSIS — J984 Other disorders of lung: Secondary | ICD-10-CM | POA: Diagnosis not present

## 2018-01-20 DIAGNOSIS — Z79899 Other long term (current) drug therapy: Secondary | ICD-10-CM | POA: Diagnosis not present

## 2018-01-20 DIAGNOSIS — E861 Hypovolemia: Secondary | ICD-10-CM | POA: Diagnosis present

## 2018-01-20 DIAGNOSIS — K06010 Localized gingival recession, unspecified: Secondary | ICD-10-CM | POA: Diagnosis not present

## 2018-01-20 DIAGNOSIS — I76 Septic arterial embolism: Secondary | ICD-10-CM | POA: Diagnosis present

## 2018-01-20 DIAGNOSIS — G9341 Metabolic encephalopathy: Secondary | ICD-10-CM | POA: Diagnosis present

## 2018-01-20 DIAGNOSIS — K0530 Chronic periodontitis, unspecified: Secondary | ICD-10-CM | POA: Diagnosis present

## 2018-01-20 DIAGNOSIS — K0889 Other specified disorders of teeth and supporting structures: Secondary | ICD-10-CM | POA: Diagnosis not present

## 2018-01-20 DIAGNOSIS — J384 Edema of larynx: Secondary | ICD-10-CM | POA: Diagnosis not present

## 2018-01-20 DIAGNOSIS — Z765 Malingerer [conscious simulation]: Secondary | ICD-10-CM

## 2018-01-20 DIAGNOSIS — E8729 Other acidosis: Secondary | ICD-10-CM

## 2018-01-20 DIAGNOSIS — N179 Acute kidney failure, unspecified: Secondary | ICD-10-CM | POA: Diagnosis present

## 2018-01-20 DIAGNOSIS — Z0189 Encounter for other specified special examinations: Secondary | ICD-10-CM

## 2018-01-20 DIAGNOSIS — M264 Malocclusion, unspecified: Secondary | ICD-10-CM | POA: Diagnosis not present

## 2018-01-20 DIAGNOSIS — Z978 Presence of other specified devices: Secondary | ICD-10-CM | POA: Diagnosis not present

## 2018-01-20 DIAGNOSIS — Z833 Family history of diabetes mellitus: Secondary | ICD-10-CM

## 2018-01-20 DIAGNOSIS — L97529 Non-pressure chronic ulcer of other part of left foot with unspecified severity: Secondary | ICD-10-CM | POA: Diagnosis present

## 2018-01-20 DIAGNOSIS — E872 Acidosis: Secondary | ICD-10-CM | POA: Diagnosis not present

## 2018-01-20 DIAGNOSIS — L89152 Pressure ulcer of sacral region, stage 2: Secondary | ICD-10-CM | POA: Diagnosis present

## 2018-01-20 DIAGNOSIS — E10621 Type 1 diabetes mellitus with foot ulcer: Secondary | ICD-10-CM | POA: Diagnosis present

## 2018-01-20 DIAGNOSIS — K045 Chronic apical periodontitis: Secondary | ICD-10-CM | POA: Diagnosis not present

## 2018-01-20 DIAGNOSIS — J9690 Respiratory failure, unspecified, unspecified whether with hypoxia or hypercapnia: Secondary | ICD-10-CM | POA: Diagnosis not present

## 2018-01-20 DIAGNOSIS — Z794 Long term (current) use of insulin: Secondary | ICD-10-CM | POA: Diagnosis not present

## 2018-01-20 DIAGNOSIS — E860 Dehydration: Secondary | ICD-10-CM | POA: Diagnosis present

## 2018-01-20 DIAGNOSIS — E785 Hyperlipidemia, unspecified: Secondary | ICD-10-CM | POA: Diagnosis present

## 2018-01-20 DIAGNOSIS — L97309 Non-pressure chronic ulcer of unspecified ankle with unspecified severity: Secondary | ICD-10-CM | POA: Diagnosis not present

## 2018-01-20 DIAGNOSIS — Z885 Allergy status to narcotic agent status: Secondary | ICD-10-CM | POA: Diagnosis not present

## 2018-01-20 DIAGNOSIS — K036 Deposits [accretions] on teeth: Secondary | ICD-10-CM | POA: Diagnosis not present

## 2018-01-20 DIAGNOSIS — E781 Pure hyperglyceridemia: Secondary | ICD-10-CM | POA: Diagnosis present

## 2018-01-20 DIAGNOSIS — E10622 Type 1 diabetes mellitus with other skin ulcer: Secondary | ICD-10-CM | POA: Diagnosis not present

## 2018-01-20 DIAGNOSIS — R1310 Dysphagia, unspecified: Secondary | ICD-10-CM | POA: Diagnosis present

## 2018-01-20 DIAGNOSIS — L97509 Non-pressure chronic ulcer of other part of unspecified foot with unspecified severity: Secondary | ICD-10-CM | POA: Diagnosis not present

## 2018-01-20 DIAGNOSIS — E7849 Other hyperlipidemia: Secondary | ICD-10-CM | POA: Diagnosis not present

## 2018-01-20 DIAGNOSIS — R4182 Altered mental status, unspecified: Secondary | ICD-10-CM | POA: Diagnosis present

## 2018-01-20 DIAGNOSIS — D72828 Other elevated white blood cell count: Secondary | ICD-10-CM | POA: Diagnosis not present

## 2018-01-20 DIAGNOSIS — R079 Chest pain, unspecified: Secondary | ICD-10-CM | POA: Diagnosis not present

## 2018-01-20 DIAGNOSIS — L97429 Non-pressure chronic ulcer of left heel and midfoot with unspecified severity: Secondary | ICD-10-CM | POA: Diagnosis not present

## 2018-01-20 DIAGNOSIS — K219 Gastro-esophageal reflux disease without esophagitis: Secondary | ICD-10-CM | POA: Diagnosis present

## 2018-01-20 DIAGNOSIS — K047 Periapical abscess without sinus: Secondary | ICD-10-CM | POA: Diagnosis present

## 2018-01-20 DIAGNOSIS — R7881 Bacteremia: Secondary | ICD-10-CM | POA: Diagnosis not present

## 2018-01-20 DIAGNOSIS — E1011 Type 1 diabetes mellitus with ketoacidosis with coma: Secondary | ICD-10-CM | POA: Diagnosis not present

## 2018-01-20 DIAGNOSIS — E1110 Type 2 diabetes mellitus with ketoacidosis without coma: Secondary | ICD-10-CM | POA: Diagnosis not present

## 2018-01-20 DIAGNOSIS — D649 Anemia, unspecified: Secondary | ICD-10-CM | POA: Diagnosis present

## 2018-01-20 DIAGNOSIS — E10649 Type 1 diabetes mellitus with hypoglycemia without coma: Secondary | ICD-10-CM | POA: Diagnosis present

## 2018-01-20 DIAGNOSIS — R4020 Unspecified coma: Secondary | ICD-10-CM | POA: Diagnosis not present

## 2018-01-20 DIAGNOSIS — I5032 Chronic diastolic (congestive) heart failure: Secondary | ICD-10-CM | POA: Diagnosis present

## 2018-01-20 DIAGNOSIS — F259 Schizoaffective disorder, unspecified: Secondary | ICD-10-CM | POA: Diagnosis present

## 2018-01-20 DIAGNOSIS — I110 Hypertensive heart disease with heart failure: Secondary | ICD-10-CM | POA: Diagnosis present

## 2018-01-20 DIAGNOSIS — E440 Moderate protein-calorie malnutrition: Secondary | ICD-10-CM | POA: Diagnosis present

## 2018-01-20 DIAGNOSIS — K3184 Gastroparesis: Secondary | ICD-10-CM | POA: Diagnosis present

## 2018-01-20 DIAGNOSIS — J45909 Unspecified asthma, uncomplicated: Secondary | ICD-10-CM | POA: Diagnosis present

## 2018-01-20 DIAGNOSIS — E876 Hypokalemia: Secondary | ICD-10-CM | POA: Diagnosis not present

## 2018-01-20 DIAGNOSIS — R451 Restlessness and agitation: Secondary | ICD-10-CM | POA: Diagnosis present

## 2018-01-20 DIAGNOSIS — L89151 Pressure ulcer of sacral region, stage 1: Secondary | ICD-10-CM | POA: Diagnosis not present

## 2018-01-20 DIAGNOSIS — G4700 Insomnia, unspecified: Secondary | ICD-10-CM | POA: Diagnosis not present

## 2018-01-20 DIAGNOSIS — I10 Essential (primary) hypertension: Secondary | ICD-10-CM | POA: Diagnosis not present

## 2018-01-20 DIAGNOSIS — L97329 Non-pressure chronic ulcer of left ankle with unspecified severity: Secondary | ICD-10-CM | POA: Diagnosis present

## 2018-01-20 DIAGNOSIS — F419 Anxiety disorder, unspecified: Secondary | ICD-10-CM | POA: Diagnosis present

## 2018-01-20 DIAGNOSIS — E1043 Type 1 diabetes mellitus with diabetic autonomic (poly)neuropathy: Secondary | ICD-10-CM | POA: Diagnosis present

## 2018-01-20 DIAGNOSIS — R571 Hypovolemic shock: Secondary | ICD-10-CM | POA: Diagnosis present

## 2018-01-20 DIAGNOSIS — A419 Sepsis, unspecified organism: Secondary | ICD-10-CM | POA: Diagnosis present

## 2018-01-20 DIAGNOSIS — K08409 Partial loss of teeth, unspecified cause, unspecified class: Secondary | ICD-10-CM | POA: Diagnosis not present

## 2018-01-20 DIAGNOSIS — L8990 Pressure ulcer of unspecified site, unspecified stage: Secondary | ICD-10-CM

## 2018-01-20 LAB — CBG MONITORING, ED
Glucose-Capillary: >600 mg/dL (ref 65–99)
Glucose-Capillary: >600 mg/dL (ref 65–99)
Glucose-Capillary: >600 mg/dL (ref 65–99)

## 2018-01-20 LAB — TRIGLYCERIDES: TRIGLYCERIDES: 1087 mg/dL — AB (ref <150)

## 2018-01-20 LAB — CBC
HEMATOCRIT: 37.3 % — AB (ref 39.0–52.0)
HEMOGLOBIN: 11.2 g/dL — AB (ref 13.0–17.0)
MCH: 30.4 pg (ref 26.0–34.0)
MCHC: 30 g/dL (ref 30.0–36.0)
MCV: 101.4 fL — AB (ref 78.0–100.0)
PLATELETS: 477 10*3/uL — AB (ref 150–400)
RBC: 3.68 MIL/uL — AB (ref 4.22–5.81)
RDW: 14.1 % (ref 11.5–15.5)
WBC: 42.5 10*3/uL — AB (ref 4.0–10.5)

## 2018-01-20 LAB — BASIC METABOLIC PANEL
BUN: 49 mg/dL — ABNORMAL HIGH (ref 6–20)
Calcium: 8.5 mg/dL — ABNORMAL LOW (ref 8.9–10.3)
Chloride: 95 mmol/L — ABNORMAL LOW (ref 101–111)
Creatinine, Ser: 3.6 mg/dL — ABNORMAL HIGH (ref 0.61–1.24)
GFR calc non Af Amer: 21 mL/min — ABNORMAL LOW (ref >60)
GFR, EST AFRICAN AMERICAN: 24 mL/min — AB (ref >60)
GLUCOSE: 1175 mg/dL — AB (ref 65–99)
Sodium: 131 mmol/L — ABNORMAL LOW (ref 135–145)

## 2018-01-20 LAB — CULTURE, BLOOD (ROUTINE X 2): Culture: NO GROWTH

## 2018-01-20 MED ORDER — HEPARIN SODIUM (PORCINE) 5000 UNIT/ML IJ SOLN
5000.0000 [IU] | Freq: Three times a day (TID) | INTRAMUSCULAR | Status: DC
  Administered 2018-01-21 (×2): 5000 [IU] via SUBCUTANEOUS
  Filled 2018-01-20 (×3): qty 1

## 2018-01-20 MED ORDER — PROPOFOL 1000 MG/100ML IV EMUL
0.0000 ug/kg/min | INTRAVENOUS | Status: DC
  Administered 2018-01-21 (×2): 50 ug/kg/min via INTRAVENOUS
  Filled 2018-01-20 (×2): qty 100

## 2018-01-20 MED ORDER — SODIUM BICARBONATE 8.4 % IV SOLN
50.0000 meq | Freq: Once | INTRAVENOUS | Status: AC
  Administered 2018-01-20: 50 meq via INTRAVENOUS
  Filled 2018-01-20: qty 50

## 2018-01-20 MED ORDER — NOREPINEPHRINE BITARTRATE 1 MG/ML IV SOLN
0.0000 ug/min | INTRAVENOUS | Status: DC
  Administered 2018-01-21: 9 ug/min via INTRAVENOUS
  Administered 2018-01-21: 15 ug/min via INTRAVENOUS
  Administered 2018-01-21: 5 ug/min via INTRAVENOUS
  Filled 2018-01-20: qty 4

## 2018-01-20 MED ORDER — STERILE WATER FOR INJECTION IV SOLN
INTRAVENOUS | Status: DC
  Administered 2018-01-20: 23:00:00 via INTRAVENOUS
  Filled 2018-01-20 (×3): qty 850

## 2018-01-20 MED ORDER — DEXTROSE-NACL 5-0.45 % IV SOLN
INTRAVENOUS | Status: DC
  Administered 2018-01-21: 900 mL via INTRAVENOUS

## 2018-01-20 MED ORDER — SODIUM CHLORIDE 0.9 % IV BOLUS (SEPSIS)
4000.0000 mL | Freq: Once | INTRAVENOUS | Status: AC
  Administered 2018-01-20: 4000 mL via INTRAVENOUS

## 2018-01-20 MED ORDER — SODIUM CHLORIDE 0.9% FLUSH
10.0000 mL | INTRAVENOUS | Status: DC | PRN
  Administered 2018-01-29: 10 mL
  Filled 2018-01-20: qty 40

## 2018-01-20 MED ORDER — PROPOFOL 1000 MG/100ML IV EMUL
5.0000 ug/kg/min | Freq: Once | INTRAVENOUS | Status: AC
  Administered 2018-01-20: 5 ug/kg/min via INTRAVENOUS
  Administered 2018-01-20: 22:00:00 via INTRAVENOUS

## 2018-01-20 MED ORDER — NOREPINEPHRINE BITARTRATE 1 MG/ML IV SOLN
0.0000 ug/min | Freq: Once | INTRAVENOUS | Status: AC
  Administered 2018-01-20: 5 ug/min via INTRAVENOUS
  Filled 2018-01-20: qty 4

## 2018-01-20 MED ORDER — PANTOPRAZOLE SODIUM 40 MG IV SOLR
40.0000 mg | INTRAVENOUS | Status: DC
  Administered 2018-01-21 (×2): 40 mg via INTRAVENOUS
  Filled 2018-01-20 (×2): qty 40

## 2018-01-20 MED ORDER — SODIUM CHLORIDE 0.9 % IV SOLN
INTRAVENOUS | Status: DC
  Administered 2018-01-21: via INTRAVENOUS

## 2018-01-20 MED ORDER — SODIUM CHLORIDE 0.9 % IV SOLN
1.0000 g | Freq: Once | INTRAVENOUS | Status: AC
  Administered 2018-01-20: 1 g via INTRAVENOUS
  Filled 2018-01-20: qty 10

## 2018-01-20 MED ORDER — MORPHINE SULFATE (PF) 2 MG/ML IV SOLN
2.0000 mg | INTRAVENOUS | Status: DC | PRN
  Administered 2018-01-20: 2 mg via INTRAVENOUS
  Filled 2018-01-20 (×2): qty 1

## 2018-01-20 MED ORDER — SODIUM CHLORIDE 0.9 % IV SOLN
INTRAVENOUS | Status: DC
  Administered 2018-01-21: 21.6 [IU]/h via INTRAVENOUS
  Filled 2018-01-20 (×3): qty 1

## 2018-01-20 MED ORDER — SODIUM CHLORIDE 0.9 % IV SOLN
INTRAVENOUS | Status: DC
  Administered 2018-01-20: 5.4 [IU]/h via INTRAVENOUS
  Filled 2018-01-20: qty 1

## 2018-01-20 MED ORDER — SODIUM BICARBONATE 8.4 % IV SOLN: INTRAVENOUS | Status: DC

## 2018-01-20 MED ORDER — PROPOFOL 1000 MG/100ML IV EMUL
INTRAVENOUS | Status: AC
  Administered 2018-01-20: 5 ug/kg/min via INTRAVENOUS
  Filled 2018-01-20: qty 100

## 2018-01-20 MED ORDER — SODIUM CHLORIDE 0.9 % IV SOLN: INTRAVENOUS | Status: DC

## 2018-01-20 MED ORDER — DEXTROSE 5 % IV SOLN
1.0000 g | Freq: Once | INTRAVENOUS | Status: AC
  Administered 2018-01-20: 1 g via INTRAVENOUS
  Filled 2018-01-20: qty 10

## 2018-01-20 NOTE — ED Triage Notes (Signed)
Pt BIB [facility] for hyperglycemia, hypotension, and unresponsiveness. Hx DKA. NPA in place. EMS vitals: BP 77/45. 125 mL NS given PTA.

## 2018-01-20 NOTE — ED Notes (Signed)
This RN noted swelling around IJ site, all infusions stopped immediately. Pharmacy consulted, stated to place heat pack on site. EDP made aware and is at bedside assessing pt at this time.

## 2018-01-20 NOTE — H&P (Signed)
PULMONARY / CRITICAL CARE MEDICINE   Name: [name] MRN: [id number] DOB: 02/08/1986    ADMISSION DATE:  01/20/2018 CONSULTATION DATE:  01/20/2018  REFERRING MD:  Dr. August Saucer  CHIEF COMPLAINT:  DKA  HISTORY OF PRESENT ILLNESS:   32 year old male with PMH of Asthma, Anemia, 1DM, GERD, HTN, Schizoaffective disorder, depression with multiple admissions secondary to medical non-compliance. Most recently admitted 1/28-1/31   Presents to ED on 2/3 with recurrent DKA. Upon arrival patient is hypotensive, hyperglycemic, and unresponsive. Per EMS patient found with crushed pills around him. Roommate called EMS, however, unsure how long patient has been unresponsive. VBG with pH of 6.6. Glucose 1175. Started on Insulin and Bicarb gtt, given one liter of NS. Intubated for airway protection. PCCM asked to admit.    PAST MEDICAL HISTORY :  He  has a past medical history of Anemia, Anxiety, Arthritis, Chicken pox, Childhood asthma, Chronic pain, Depression, DKA (diabetic ketoacidoses) ([name]) (11/18/2014), DKA (diabetic ketoacidoses) ([name]) (07/05/2017), GERD (gastroesophageal reflux disease), Hypertension, Migraine, Noncompliance with medication regimen, Pneumonia, Polysubstance abuse ([name]), Schizo affective schizophrenia ([name]), Scoliosis, and Type I diabetes mellitus ([name]).  PAST SURGICAL HISTORY: He  has a past surgical history that includes Incision and drainage abscess (Left, 11/2011) and Cardiac catheterization (11/2016).  Allergies  Allergen Reactions  . Sulfa Antibiotics Other (See Comments)    Unknown childhood allergy  . Fentanyl Other (See Comments)    Patient had hallucinations with the medication    No current facility-administered medications on file prior to encounter.    Current Outpatient Medications on File Prior to Encounter  Medication Sig  . amphetamine-dextroamphetamine (ADDERALL) 30 MG tablet Take 30 mg daily by mouth.  . doxepin (SINEQUAN) 10 MG/ML solution Take 10 mg by  mouth at bedtime.   . insulin aspart (NOVOLOG FLEXPEN) 100 UNIT/ML FlexPen Inject 0-15 Units into the skin See admin instructions. Check Blood Sugar 4 times per day > with meals and at bedtime CBG 70 - 120: 0 units CBG 121 - 150: 2 units CBG 151 - 200: 3 units CBG 201 - 250: 5 units CBG 251 - 300: 8 units CBG 301 - 350: 11 units CBG 351 - 400: 15 units  . insulin aspart (NOVOLOG) 100 UNIT/ML injection Take 6 units three times daily just before meals.  . insulin detemir (LEVEMIR) 100 UNIT/ML injection Inject 0.18 mLs (18 Units total) into the skin 2 (two) times daily.  . metoCLOPramide (REGLAN) 10 MG tablet Take 1 tablet (10 mg total) by mouth 3 (three) times daily as needed for nausea or vomiting.  . mupirocin cream (BACTROBAN) 2 % Apply topically daily. Apply Bactroban to left heel wound Q day, then cover with foam dressing.  (Change foam dressing Q 3 days or PRN soiling.)  . nicotine (NICODERM CQ - DOSED IN MG/24 HOURS) 14 mg/24hr patch Place 1 patch (14 mg total) onto the skin daily.  . pantoprazole (PROTONIX) 40 MG tablet Take 1 tablet (40 mg total) by mouth 2 (two) times daily before a meal.  . sennosides-docusate sodium (SENOKOT-S) 8.6-50 MG tablet Take 1 tablet by mouth as needed for constipation.  . traZODone (DESYREL) 150 MG tablet Take 150 mg by mouth at bedtime.  . [DISCONTINUED] sertraline (ZOLOFT) 50 MG tablet Take 50 mg by mouth daily. For depression. Just started med, have not picked up yet [facility] [address]    FAMILY HISTORY:  His indicated that his mother is alive.   SOCIAL HISTORY: He  reports that  he has been smoking cigarettes.  He has a 4.50 pack-year smoking history. he has never used smokeless tobacco. He reports that he uses drugs. Drug: Marijuana. He reports that he does not drink alcohol.  REVIEW OF SYSTEMS:   Unable to review as patient is intubated and sedated   SUBJECTIVE:   VITAL SIGNS: BP 131/88   Pulse 77   Resp 14   Ht 6' (1.829 m) Comment:  measured  SpO2 100%   BMI 19.61 kg/m   HEMODYNAMICS:    VENTILATOR SETTINGS: Vent Mode: PRVC FiO2 (%):  [100 %] 100 % Set Rate:  [16 bmp] 16 bmp Vt Set:  [093 mL] 620 mL PEEP:  [5 cmH20] 5 cmH20 Plateau Pressure:  [11 cmH20] 11 cmH20  INTAKE / OUTPUT: No intake/output data recorded.  PHYSICAL EXAMINATION: General:  Thin adult male, on vent  Neuro:  Sedated, moves extremities, pupils intact  HEENT:  ETT in place  Cardiovascular:  RRR, no MRG  Lungs:  Clear breath sounds, no wheeze/crackles  Abdomen:  Non-distended, active bowel sounds  Musculoskeletal:  -edema  Skin:  Left Diabetic foot ulcer to sole  LABS:  BMET Recent Labs  Lab 01/16/18 0912 01/16/18 1233 01/20/18 1855  NA 136 138 131*  K 3.1* 3.1* >7.5*  CL 105 106 95*  CO2 21* 26 <7*  BUN 12 11 49*  CREATININE 1.06 0.81 3.60*  GLUCOSE 481* 274* 1,175*    Electrolytes Recent Labs  Lab 01/14/18 2047  01/16/18 0503 01/16/18 0912 01/16/18 1233 01/20/18 1855  CALCIUM  --    < > 8.3* 8.4* 8.5* 8.5*  MG 2.3  --  1.6*  --   --   --   PHOS 7.3*  --  1.7*  --   --   --    < > = values in this interval not displayed.    CBC Recent Labs  Lab 01/14/18 1754 01/14/18 1806 01/20/18 1812  WBC 25.7*  --  42.5*  HGB 12.8* 13.9 11.2*  HCT 38.6* 41.0 37.3*  PLT 561*  --  477*    Coag's Recent Labs  Lab 01/14/18 2047  APTT 33  INR 1.28    Sepsis Markers Recent Labs  Lab 01/14/18 2047 01/14/18 2338 01/15/18 0202  LATICACIDVEN  --  2.6* 1.9  PROCALCITON 0.55  --   --     ABG Recent Labs  Lab 01/15/18 0037 01/15/18 0350  PHART 7.209* 7.334*  PCO2ART  --  23.0*  PO2ART 39.6* 77.0*    Liver Enzymes Recent Labs  Lab 01/14/18 1754  AST 17  ALT 18  ALKPHOS 152*  BILITOT 1.5*  ALBUMIN 4.1    Cardiac Enzymes No results for input(s): TROPONINI, PROBNP in the last 168 hours.  Glucose Recent Labs  Lab 01/16/18 2042 01/17/18 0725 01/17/18 1136 01/20/18 1800 01/20/18 2047  01/20/18 2149  GLUCAP 336* 564* 357* >600* >600* >600*    Imaging Dg Chest Portable 1 View  Result Date: 01/20/2018 CLINICAL DATA:  Intubation. EXAM: PORTABLE CHEST 1 VIEW COMPARISON:  January 14, 2017 FINDINGS: The ETT is in good position. An NG tube terminates with the distal tip above the GE junction. Recommend advancement. No nodules or masses. No focal infiltrates. The cardiomediastinal silhouette is normal. IMPRESSION: 1. The NG tube distal tip is above the GE junction. Recommend advancement before use. 2. The ETT is in good position. 3. The chest is otherwise normal. Electronically Signed   By: Gerome Sam III M.D  On: 01/20/2018 22:04     STUDIES:  CXR 2/3 > No acute, ETT is in good position, NG needs advancement   CULTURES: Blood 2/3 >> Urine 2/3 >> Sputum 2/3 >>  ANTIBIOTICS: Rocephin 2/3  SIGNIFICANT EVENTS: 2/3 > Presents to ED   LINES/TUBES: ETT 2/3 >>   DISCUSSION: 32 year old male with recurrent DKA secondary to non-compliance presents to ED hyperglycemia, hypotension, and unresponsive. Glucose 1175, pH 6.6 on VBG. Intubated and admitted to ICU.   ASSESSMENT / PLAN:  PULMONARY A: Respiratory Insufficieny in setting of metabolic encephalopathy  H/O Asthma  P:   Maintain Saturation >92 Pulmonary Hygiene  Vent Support VAP Bundle  Trend ABG/CXR  CARDIOVASCULAR A:  Hypovolemic Shock in setting of dehydration and DKA  H/O HTN  P:  Cardiac Monitoring Wean Levophed to maintain MAP >65  Aggressive IV Fluids   RENAL A:   Severe Anion Gap Metabolic Acidosis in setting of DKA  Acute Kidney Injury s/p 1L of NS  Pseudohyperkalemia in setting of hyperglycemia   P:   Trend BMP q4H Trend LA  Continue Bicarb gtt until pH >7 (VBG in ED with pH 6.6)  NS @ 125 ml/hr, change to D5 1/2 when glucose <250  Aggressive Fluid Administration --- Will need at least 4 additional liters    GASTROINTESTINAL A:   SUP H/O Gastroparesis  P:   PPI NPO Advance OG  tube   HEMATOLOGIC A:   Normocytic Anemia  VTE  P:  Trend CBC  Transfuse for Hbg <7  Heparin SQ and SCDs   INFECTIOUS A:   Leukocytosis  CXR Clear  Hypothermia  Chronic Left Diabetic Foot Ulcer  P:   Trend WBC and Fever Curve  Trend PCT and LA  Monitor off antibiotics   ENDOCRINE A:   DKA H/O Noncompliant Type 1DM    P:   DKA protocol  Continue Insulin gtt per protocol  Trend Glucose and BMP  NEUROLOGIC A:   Metabolic Encephalopathy  H/O Schizoaffective Disorder on Trazodone, Depression   P:   RASS goal: 0/-1 Wean Propofol gtt to achieve RASS (Allergy to fentanyl)  Ethanol and UDS pending   FAMILY  - Updates: No family at bedside   - Inter-disciplinary family meet or Palliative Care meeting due by: 01/27/2018    CC Time: 45 minutes   Jovita Kussmaul, AGACNP-BC [facility]  Pgr: [phone]  PCCM Pgr: [phone]

## 2018-01-20 NOTE — ED Notes (Signed)
Date and time results received: 01/20/18 9:07 PM  (use smartphrase ".now" to insert current time)  Test: Potassium, Glucose Critical Value: greater than 7.5, 1175  Name of Provider Notified: Dr. August Saucer, Dr. Jodi Mourning  Orders Received? Or Actions Taken?: obtain EKG, see new orders

## 2018-01-20 NOTE — Progress Notes (Signed)
Patient transported on vent from ED to 20M-15 without complications.

## 2018-01-20 NOTE — ED Provider Notes (Signed)
[facility] EMERGENCY DEPARTMENT Provider Note   CSN: [id number] Arrival date & time: 01/20/18  1745     History   Chief Complaint Chief Complaint  Patient presents with  . Hyperglycemia  . Altered Mental Status    HPI [name] is a 32 y.o. male.  Found by roommate. Unknown down time.  History of DKA.  Crushed pills found near patient.    Altered Mental Status   This is a new problem. Episode onset: unknown. The problem has not changed since onset.Associated symptoms include unresponsiveness. Risk factors: found with crushed pills beside him. His past medical history is significant for diabetes.    Past Medical History:  Diagnosis Date  . Anemia   . Anxiety   . Arthritis    "both hips; both shoulders" (11/05/2017)  . Chicken pox   . Childhood asthma   . Chronic pain   . Depression   . DKA (diabetic ketoacidoses) ([name]) 11/18/2014  . DKA (diabetic ketoacidoses) ([name]) 07/05/2017  . GERD (gastroesophageal reflux disease)   . Hypertension   . Migraine    "a few/year" (06/06/2017)  . Noncompliance with medication regimen   . Pneumonia    "several times" (06/06/2017)  . Polysubstance abuse ([name])   . Schizo affective schizophrenia ([name])   . Scoliosis   . Type I diabetes mellitus [facility])     Patient Active Problem List   Diagnosis Date Noted  . Metabolic encephalopathy   . Vision loss 12/10/2017  . DKA (diabetic ketoacidosis) ([name]) 11/03/2017  . Pressure injury of contiguous region involving back and buttock, stage 1 10/11/2017  . Encephalopathy acute   . Hyperbilirubinemia 09/14/2017  . Heel ulcer ([name]) 09/14/2017  . Noncompliance with medication regimen   . Hypokalemia   . Non healing left heel wound   . Cellulitis   . DKA, type 1 ([name]) 08/17/2017  . Encounter for imaging study to confirm orogastric (OG) tube placement   . Acute respiratory acidosis   . DKA (diabetic ketoacidoses) ([name]) 08/04/2017  . Acute respiratory failure with  hypoxia ([name])   . Seizure ([name])   . Diabetic ketoacidosis ([name]) 07/30/2017  . Normocytic anemia 07/05/2017  . Numbness of right hand 07/05/2017  . Elevated troponin I measurement   . Positive D dimer   . DKA, type 1, not at goal [facility]) 07/01/2017  . Hyperkalemia   . Acute kidney injury ([name])   . Medically noncompliant   . Hyperglycemia 06/16/2017  . Nausea & vomiting 06/15/2017  . Abdominal pain 06/15/2017  . Sepsis ([name]) 06/07/2017  . Chest pain 06/07/2017  . GERD (gastroesophageal reflux disease) 06/07/2017  . Substance abuse ([name]) 05/30/2016  . Substance induced mood disorder ([name]) 05/30/2016  . Tobacco use disorder 12/24/2015  . DM hyperosmolarity type I, uncontrolled ([name]) 12/19/2015  . Generalized headache 07/22/2015  . Bilateral hip bursitis 05/26/2015  . Depression   . Generalized anxiety disorder 03/29/2015  . Undifferentiated schizophrenia ([name])   . Hip pain, bilateral 11/18/2014  . Dehydration 06/17/2012    Past Surgical History:  Procedure Laterality Date  . CARDIAC CATHETERIZATION  11/2016  . INCISION AND DRAINAGE ABSCESS Left 11/2011   "MRSA removed off my thumb"       Home Medications    Prior to Admission medications   Medication Sig Start Date End Date Taking? Authorizing Provider  amphetamine-dextroamphetamine (ADDERALL) 30 MG tablet Take 30 mg daily by mouth. 10/02/17   [provider]  doxepin (SINEQUAN) 10 MG/ML solution  Take 10 mg by mouth at bedtime.  07/11/17   [provider]  insulin aspart (NOVOLOG FLEXPEN) 100 UNIT/ML FlexPen Inject 0-15 Units into the skin See admin instructions. Check Blood Sugar 4 times per day > with meals and at bedtime CBG 70 - 120: 0 units CBG 121 - 150: 2 units CBG 151 - 200: 3 units CBG 201 - 250: 5 units CBG 251 - 300: 8 units CBG 301 - 350: 11 units CBG 351 - 400: 15 units 12/12/17   Rai, Ripudeep K, MD  insulin aspart (NOVOLOG) 100 UNIT/ML injection Take 6 units three times daily just before  meals. 01/17/18 01/17/19  Alwyn Ren, MD  insulin detemir (LEVEMIR) 100 UNIT/ML injection Inject 0.18 mLs (18 Units total) into the skin 2 (two) times daily. 01/17/18   Alwyn Ren, MD  metoCLOPramide (REGLAN) 10 MG tablet Take 1 tablet (10 mg total) by mouth 3 (three) times daily as needed for nausea or vomiting. 12/12/17   Rai, Delene Ruffini, MD  mupirocin cream (BACTROBAN) 2 % Apply topically daily. Apply Bactroban to left heel wound Q day, then cover with foam dressing.  (Change foam dressing Q 3 days or PRN soiling.) 12/12/17   Rai, Ripudeep K, MD  nicotine (NICODERM CQ - DOSED IN MG/24 HOURS) 14 mg/24hr patch Place 1 patch (14 mg total) onto the skin daily. 12/06/17   Mikhail, Nita Sells, DO  pantoprazole (PROTONIX) 40 MG tablet Take 1 tablet (40 mg total) by mouth 2 (two) times daily before a meal. 12/12/17   Rai, Ripudeep K, MD  sennosides-docusate sodium (SENOKOT-S) 8.6-50 MG tablet Take 1 tablet by mouth as needed for constipation.    [provider]  traZODone (DESYREL) 150 MG tablet Take 150 mg by mouth at bedtime. 06/26/17   [provider]  sertraline (ZOLOFT) 50 MG tablet Take 50 mg by mouth daily. For depression. Just started med, have not picked up yet [facility] [address] 02/22/12 03/08/12  Viviann Spare, FNP    Family History Family History  Problem Relation Age of Onset  . Diabetes Mother     Social History Social History   Tobacco Use  . Smoking status: Current Every Day Smoker    Packs/day: 0.50    Years: 9.00    Pack years: 4.50    Types: Cigarettes  . Smokeless tobacco: Never Used  Substance Use Topics  . Alcohol use: No  . Drug use: Yes    Types: Marijuana    Comment: 11/05/2017 "nonesince 2012"; pt denies hx of cocaine and methamphetamines use on 11/05/2017     Allergies   Sulfa antibiotics and Fentanyl   Review of Systems Review of Systems  Unable to perform ROS: Patient unresponsive     Physical Exam Updated Vital  Signs BP (!) 80/51   Pulse 62   Resp 14   SpO2 100%   Physical Exam  Constitutional: He appears well-developed and well-nourished.  HENT:  Head: Normocephalic and atraumatic.  Eyes: Conjunctivae are normal. Pupils are equal, round, and reactive to light.  Neck: Neck supple.  Cardiovascular: Normal rate and regular rhythm.  Pulmonary/Chest: Breath sounds normal.  kussmaul breathing  Abdominal: Soft. There is no tenderness.  Musculoskeletal: He exhibits no edema or deformity.  Neurological:  Unresponsive  Skin: Skin is warm and dry.  Nursing note and vitals reviewed.    ED Treatments / Results  Labs (all labs ordered are listed, but only abnormal results are displayed) Labs Reviewed  CBC - Abnormal; Notable for the following components:      Result Value   WBC 42.5 (*)    RBC 3.68 (*)    Hemoglobin 11.2 (*)    HCT 37.3 (*)    MCV 101.4 (*)    Platelets 477 (*)    All other components within normal limits  CBG MONITORING, ED - Abnormal; Notable for the following components:   Glucose-Capillary >600 (*)    All other components within normal limits  CULTURE, BLOOD (ROUTINE X 2)  CULTURE, BLOOD (ROUTINE X 2)  URINALYSIS, ROUTINE W REFLEX MICROSCOPIC  BASIC METABOLIC PANEL  BASIC METABOLIC PANEL  BASIC METABOLIC PANEL  BASIC METABOLIC PANEL  I-STAT VENOUS BLOOD GAS, ED  I-STAT CHEM 8, ED    EKG  EKG Interpretation None       Radiology Dg Chest Portable 1 View  Result Date: 01/20/2018 CLINICAL DATA:  Intubation. EXAM: PORTABLE CHEST 1 VIEW COMPARISON:  January 14, 2017 FINDINGS: The ETT is in good position. An NG tube terminates with the distal tip above the GE junction. Recommend advancement. No nodules or masses. No focal infiltrates. The cardiomediastinal silhouette is normal. IMPRESSION: 1. The NG tube distal tip is above the GE junction. Recommend advancement before use. 2. The ETT is in good position. 3. The chest is otherwise normal. Electronically Signed    By: Gerome Sam III M.D   On: 01/20/2018 22:04    Procedures Procedure Name: Awake intubation Date/Time: 01/21/2018 1:43 AM Performed by: Garey Ham, MD Pre-anesthesia Checklist: Patient identified and Timeout performed Oxygen Delivery Method: Non-rebreather mask Preoxygenation: Pre-oxygenation with 100% oxygen Induction Type: IV induction Ventilation: Mask ventilation without difficulty Laryngoscope Size: Glidescope and 3 Tube size: 7.5 mm Number of attempts: 1 Airway Equipment and Method: Video-laryngoscopy Placement Confirmation: ETT inserted through vocal cords under direct vision,  CO2 detector and Breath sounds checked- equal and bilateral Secured at: 26 cm Tube secured with: ETT holder Dental Injury: Teeth and Oropharynx as per pre-operative assessment     .Central Line Date/Time: 01/21/2018 1:45 AM Performed by: Garey Ham, MD Authorized by: Blane Ohara, MD   Consent:    Consent obtained:  Emergent situation Pre-procedure details:    Hand hygiene: Hand hygiene performed prior to insertion     Sterile barrier technique: All elements of maximal sterile technique followed     Skin preparation:  ChloraPrep   Skin preparation agent: Skin preparation agent completely dried prior to procedure   Anesthesia (see MAR for exact dosages):    Anesthesia method:  Local infiltration Procedure details:    Location:  L femoral   Site selection rationale:  Neck distended. Good u/s view at l femoral   Procedural supplies:  Triple lumen   Ultrasound guidance: yes     Number of attempts:  1   Successful placement: yes   Post-procedure details:    Post-procedure:  Line sutured   Assessment:  Blood return through all ports   Patient tolerance of procedure:  Tolerated well, no immediate complications   (including critical care time)  Medications Ordered in ED Medications  insulin regular (NOVOLIN R,HUMULIN R) 100 Units in sodium chloride 0.9 % 100 mL (1 Units/mL)  infusion (not administered)  norepinephrine (LEVOPHED) 4 mg in dextrose 5 % 250 mL (0.016 mg/mL) infusion (10 mcg/min Intravenous Restarted 01/20/18 2000)  sodium bicarbonate injection 50 mEq (50 mEq Intravenous Given 01/20/18 1932)  cefTRIAXone (ROCEPHIN) 1 g in dextrose 5 % 50 mL IVPB (0  g Intravenous Stopped 01/20/18 2000)     Initial Impression / Assessment and Plan / ED Course  I have reviewed the triage vital signs and the nursing notes.  Pertinent labs & imaging results that were available during my care of the patient were reviewed by me and considered in my medical decision making (see chart for details).     Mr. Allegretto is a 32 year old male with a past medical history significant for asthma, diabetes with DKA, polysubstance abuse, schizoaffective schizophrenia who presents for unresponsiveness.  On physical exam, the patient has reactive pupil response with 4 mm pupils.  Doubt narcotics overdose.  Patient's blood sugar by EMS is too high to read.  Labs obtained demonstrate elevated glucose, elevated potassium, elevated creatinine, significant leukocytosis, anemia at baseline.  Consistent with DKA.  EKG obtained with peaked T waves.  Patient is hypotensive.  Given fluid resuscitation through small IV.  Attempted further IV access were difficult.  Ultrasound guided IV's blew.  Ultrasound-guided internal jugular IV was established and fluid resuscitation was provided.  Patient given calcium gluconate, insulin, IV fluids, bicarbonate, ceftriaxone, Levophed.  Was called to bedside due to infiltration at the internal jugular site.  Patient's neck is significantly swollen.  Femoral central line placed.  Patient intubated with ketamine awake intubation.  Patient is admitted to ICU.  Final Clinical Impressions(s) / ED Diagnoses   Final diagnoses:  None    ED Discharge Orders    None       Garey Ham, MD 01/21/18 Shelby Dubin    Blane Ohara, MD 01/21/18 [phone]

## 2018-01-20 NOTE — ED Notes (Signed)
Dr.Zavitz informed of pt VBG and Chem8 lab results. Ed-lab

## 2018-01-21 ENCOUNTER — Inpatient Hospital Stay (HOSPITAL_COMMUNITY): Payer: Medicare Other

## 2018-01-21 DIAGNOSIS — E8729 Other acidosis: Secondary | ICD-10-CM

## 2018-01-21 DIAGNOSIS — Z978 Presence of other specified devices: Secondary | ICD-10-CM

## 2018-01-21 DIAGNOSIS — E872 Acidosis: Secondary | ICD-10-CM

## 2018-01-21 LAB — BASIC METABOLIC PANEL
ANION GAP: 23 — AB (ref 5–15)
ANION GAP: 18 — AB (ref 5–15)
ANION GAP: 12 (ref 5–15)
BUN: 45 mg/dL — ABNORMAL HIGH (ref 6–20)
BUN: 44 mg/dL — AB (ref 6–20)
BUN: 43 mg/dL — ABNORMAL HIGH (ref 6–20)
BUN: 39 mg/dL — AB (ref 6–20)
BUN: 37 mg/dL — AB (ref 6–20)
CHLORIDE: 108 mmol/L (ref 101–111)
CHLORIDE: 116 mmol/L — AB (ref 101–111)
CHLORIDE: 112 mmol/L — AB (ref 101–111)
CHLORIDE: 114 mmol/L — AB (ref 101–111)
CO2: 7 mmol/L — ABNORMAL LOW (ref 22–32)
CO2: 13 mmol/L — ABNORMAL LOW (ref 22–32)
CO2: 18 mmol/L — AB (ref 22–32)
Calcium: 7.3 mg/dL — ABNORMAL LOW (ref 8.9–10.3)
Calcium: 7.2 mg/dL — ABNORMAL LOW (ref 8.9–10.3)
Calcium: 7.4 mg/dL — ABNORMAL LOW (ref 8.9–10.3)
Calcium: 7.5 mg/dL — ABNORMAL LOW (ref 8.9–10.3)
Calcium: 7.5 mg/dL — ABNORMAL LOW (ref 8.9–10.3)
Chloride: 120 mmol/L — ABNORMAL HIGH (ref 101–111)
Creatinine, Ser: 3.21 mg/dL — ABNORMAL HIGH (ref 0.61–1.24)
Creatinine, Ser: 3.16 mg/dL — ABNORMAL HIGH (ref 0.61–1.24)
Creatinine, Ser: 2.99 mg/dL — ABNORMAL HIGH (ref 0.61–1.24)
Creatinine, Ser: 2.54 mg/dL — ABNORMAL HIGH (ref 0.61–1.24)
Creatinine, Ser: 2.28 mg/dL — ABNORMAL HIGH (ref 0.61–1.24)
GFR calc Af Amer: 30 mL/min — ABNORMAL LOW (ref >60)
GFR calc Af Amer: 37 mL/min — ABNORMAL LOW (ref >60)
GFR calc Af Amer: 42 mL/min — ABNORMAL LOW (ref >60)
GFR calc non Af Amer: 24 mL/min — ABNORMAL LOW (ref >60)
GFR calc non Af Amer: 24 mL/min — ABNORMAL LOW (ref >60)
GFR, EST AFRICAN AMERICAN: 28 mL/min — AB (ref >60)
GFR, EST AFRICAN AMERICAN: 28 mL/min — AB (ref >60)
GFR, EST NON AFRICAN AMERICAN: 26 mL/min — AB (ref >60)
GFR, EST NON AFRICAN AMERICAN: 32 mL/min — AB (ref >60)
GFR, EST NON AFRICAN AMERICAN: 36 mL/min — AB (ref >60)
GLUCOSE: 313 mg/dL — AB (ref 65–99)
GLUCOSE: 102 mg/dL — AB (ref 65–99)
Glucose, Bld: 883 mg/dL (ref 65–99)
Glucose, Bld: 734 mg/dL (ref 65–99)
Glucose, Bld: 556 mg/dL (ref 65–99)
POTASSIUM: 4.8 mmol/L (ref 3.5–5.1)
POTASSIUM: 3.6 mmol/L (ref 3.5–5.1)
POTASSIUM: 3.1 mmol/L — AB (ref 3.5–5.1)
POTASSIUM: 2.2 mmol/L — AB (ref 3.5–5.1)
POTASSIUM: 3.2 mmol/L — AB (ref 3.5–5.1)
SODIUM: 140 mmol/L (ref 135–145)
Sodium: 142 mmol/L (ref 135–145)
Sodium: 142 mmol/L (ref 135–145)
Sodium: 145 mmol/L (ref 135–145)
Sodium: 150 mmol/L — ABNORMAL HIGH (ref 135–145)

## 2018-01-21 LAB — MAGNESIUM
Magnesium: 2.7 mg/dL — ABNORMAL HIGH (ref 1.7–2.4)
Magnesium: 2 mg/dL (ref 1.7–2.4)

## 2018-01-21 LAB — GLUCOSE, CAPILLARY
GLUCOSE-CAPILLARY: 446 mg/dL — AB (ref 65–99)
GLUCOSE-CAPILLARY: 386 mg/dL — AB (ref 65–99)
GLUCOSE-CAPILLARY: 207 mg/dL — AB (ref 65–99)
GLUCOSE-CAPILLARY: 70 mg/dL (ref 65–99)
GLUCOSE-CAPILLARY: 90 mg/dL (ref 65–99)
Glucose-Capillary: >600 mg/dL (ref 65–99)
Glucose-Capillary: >600 mg/dL (ref 65–99)
Glucose-Capillary: >600 mg/dL (ref 65–99)
Glucose-Capillary: 597 mg/dL (ref 65–99)
Glucose-Capillary: 499 mg/dL — ABNORMAL HIGH (ref 65–99)
Glucose-Capillary: 493 mg/dL — ABNORMAL HIGH (ref 65–99)
Glucose-Capillary: 330 mg/dL — ABNORMAL HIGH (ref 65–99)
Glucose-Capillary: 266 mg/dL — ABNORMAL HIGH (ref 65–99)
Glucose-Capillary: 288 mg/dL — ABNORMAL HIGH (ref 65–99)
Glucose-Capillary: 175 mg/dL — ABNORMAL HIGH (ref 65–99)
Glucose-Capillary: 100 mg/dL — ABNORMAL HIGH (ref 65–99)
Glucose-Capillary: 92 mg/dL (ref 65–99)
Glucose-Capillary: 155 mg/dL — ABNORMAL HIGH (ref 65–99)
Glucose-Capillary: 305 mg/dL — ABNORMAL HIGH (ref 65–99)

## 2018-01-21 LAB — POCT I-STAT 3, ART BLOOD GAS (G3+)
ACID-BASE DEFICIT: 17 mmol/L — AB (ref 0.0–2.0)
Acid-base deficit: <30 mmol/L — ABNORMAL HIGH (ref 0.0–2.0)
BICARBONATE: 1.6 mmol/L — AB (ref 20.0–28.0)
BICARBONATE: 2.7 mmol/L — AB (ref 20.0–28.0)
BICARBONATE: 9.1 mmol/L — AB (ref 20.0–28.0)
O2 Saturation: 17 %
O2 Saturation: 100 %
O2 Saturation: 100 %
PH ART: 6.62 — AB (ref 7.350–7.450)
PO2 ART: 643 mmHg — AB (ref 83.0–108.0)
Patient temperature: 98.6
TCO2: <5 mmol/L — ABNORMAL LOW (ref 22–32)
TCO2: <5 mmol/L — ABNORMAL LOW (ref 22–32)
TCO2: 10 mmol/L — ABNORMAL LOW (ref 22–32)
pCO2 arterial: 15.5 mmHg — CL (ref 32.0–48.0)
pCO2 arterial: 20.2 mmHg — ABNORMAL LOW (ref 32.0–48.0)
pCO2 arterial: 23 mmHg — ABNORMAL LOW (ref 32.0–48.0)
pH, Arterial: 6.733 — CL (ref 7.350–7.450)
pH, Arterial: 7.207 — ABNORMAL LOW (ref 7.350–7.450)
pO2, Arterial: 30 mmHg — CL (ref 83.0–108.0)
pO2, Arterial: 206 mmHg — ABNORMAL HIGH (ref 83.0–108.0)

## 2018-01-21 LAB — ETHANOL

## 2018-01-21 LAB — LACTIC ACID, PLASMA
LACTIC ACID, VENOUS: 1.8 mmol/L (ref 0.5–1.9)
Lactic Acid, Venous: 1.8 mmol/L (ref 0.5–1.9)

## 2018-01-21 LAB — PHOSPHORUS

## 2018-01-21 LAB — TRIGLYCERIDES: Triglycerides: 1594 mg/dL — ABNORMAL HIGH (ref <150)

## 2018-01-21 LAB — OSMOLALITY: Osmolality: 360 mOsm/kg (ref 275–295)

## 2018-01-21 LAB — BETA-HYDROXYBUTYRIC ACID

## 2018-01-21 LAB — PROCALCITONIN: PROCALCITONIN: 3.83 ng/mL

## 2018-01-21 MED ORDER — PIPERACILLIN-TAZOBACTAM 3.375 G IVPB
3.3750 g | Freq: Three times a day (TID) | INTRAVENOUS | Status: DC
Start: 2018-01-21 — End: 2018-01-22
  Administered 2018-01-21 – 2018-01-22 (×3): 3.375 g via INTRAVENOUS
  Filled 2018-01-21 (×4): qty 50

## 2018-01-21 MED ORDER — CHLORHEXIDINE GLUCONATE 0.12 % MT SOLN
15.0000 mL | Freq: Two times a day (BID) | OROMUCOSAL | Status: DC
  Administered 2018-01-22 – 2018-02-02 (×18): 15 mL via OROMUCOSAL
  Filled 2018-01-21 (×18): qty 15

## 2018-01-21 MED ORDER — POTASSIUM PHOSPHATES 15 MMOLE/5ML IV SOLN
30.0000 mmol | Freq: Once | INTRAVENOUS | Status: AC
  Administered 2018-01-21: 30 mmol via INTRAVENOUS
  Filled 2018-01-21: qty 10

## 2018-01-21 MED ORDER — FENTANYL CITRATE (PF) 100 MCG/2ML IJ SOLN: 50.0000 ug | Freq: Once | INTRAMUSCULAR | Status: AC

## 2018-01-21 MED ORDER — INSULIN ASPART 100 UNIT/ML ~~LOC~~ SOLN
0.0000 [IU] | SUBCUTANEOUS | Status: DC
  Administered 2018-01-22 (×2): 3 [IU] via SUBCUTANEOUS

## 2018-01-21 MED ORDER — FENTANYL CITRATE (PF) 100 MCG/2ML IJ SOLN
12.5000 ug | INTRAMUSCULAR | Status: DC | PRN
Start: 2018-01-21 — End: 2018-01-22
  Administered 2018-01-22 (×2): 25 ug via INTRAVENOUS
  Filled 2018-01-21 (×2): qty 2

## 2018-01-21 MED ORDER — MIDAZOLAM HCL 2 MG/2ML IJ SOLN
1.0000 mg | INTRAMUSCULAR | Status: DC | PRN
  Administered 2018-01-21 – 2018-01-22 (×5): 2 mg via INTRAVENOUS
  Filled 2018-01-21 (×4): qty 2

## 2018-01-21 MED ORDER — SODIUM CHLORIDE 0.9 % IV BOLUS (SEPSIS)
1000.0000 mL | Freq: Once | INTRAVENOUS | Status: AC
  Administered 2018-01-21: 1000 mL via INTRAVENOUS

## 2018-01-21 MED ORDER — DEXTROSE 50 % IV SOLN
12.0000 mL | Freq: Once | INTRAVENOUS | Status: AC
  Administered 2018-01-21: 12 mL via INTRAVENOUS
  Filled 2018-01-21: qty 50

## 2018-01-21 MED ORDER — KCL IN DEXTROSE-NACL 40-5-0.45 MEQ/L-%-% IV SOLN
INTRAVENOUS | Status: DC
  Administered 2018-01-21: 999 mL via INTRAVENOUS
  Administered 2018-01-22: 75 mL/h via INTRAVENOUS
  Administered 2018-01-23: 01:00:00 via INTRAVENOUS
  Filled 2018-01-21 (×5): qty 1000

## 2018-01-21 MED ORDER — POTASSIUM CHLORIDE IN NACL 20-0.45 MEQ/L-% IV SOLN
INTRAVENOUS | Status: DC
  Administered 2018-01-21: 20:00:00 via INTRAVENOUS
  Filled 2018-01-21 (×2): qty 1000

## 2018-01-21 MED ORDER — FENTANYL BOLUS VIA INFUSION
50.0000 ug | INTRAVENOUS | Status: DC | PRN
  Administered 2018-01-21: 50 ug via INTRAVENOUS
  Filled 2018-01-21: qty 50

## 2018-01-21 MED ORDER — POTASSIUM CHLORIDE 10 MEQ/50ML IV SOLN
10.0000 meq | INTRAVENOUS | Status: AC
  Administered 2018-01-21 (×2): 10 meq via INTRAVENOUS
  Filled 2018-01-21 (×2): qty 50

## 2018-01-21 MED ORDER — VANCOMYCIN HCL IN DEXTROSE 1-5 GM/200ML-% IV SOLN
1000.0000 mg | INTRAVENOUS | Status: DC
  Filled 2018-01-21: qty 200

## 2018-01-21 MED ORDER — CHLORHEXIDINE GLUCONATE 0.12% ORAL RINSE (MEDLINE KIT)
15.0000 mL | Freq: Two times a day (BID) | OROMUCOSAL | Status: DC
  Administered 2018-01-21 – 2018-01-22 (×4): 15 mL via OROMUCOSAL

## 2018-01-21 MED ORDER — POTASSIUM CHLORIDE 20 MEQ/15ML (10%) PO SOLN
40.0000 meq | Freq: Two times a day (BID) | ORAL | Status: AC
  Administered 2018-01-21 (×2): 40 meq
  Filled 2018-01-21 (×2): qty 30

## 2018-01-21 MED ORDER — VANCOMYCIN HCL 10 G IV SOLR
1250.0000 mg | Freq: Once | INTRAVENOUS | Status: AC
  Administered 2018-01-21: 1250 mg via INTRAVENOUS
  Filled 2018-01-21: qty 1250

## 2018-01-21 MED ORDER — FENTANYL 2500MCG IN NS 250ML (10MCG/ML) PREMIX INFUSION
25.0000 ug/h | INTRAVENOUS | Status: DC
  Administered 2018-01-21: 400 ug/h via INTRAVENOUS
  Filled 2018-01-21: qty 250

## 2018-01-21 MED ORDER — ORAL CARE MOUTH RINSE
15.0000 mL | Freq: Two times a day (BID) | OROMUCOSAL | Status: DC
  Administered 2018-01-25 – 2018-02-02 (×14): 15 mL via OROMUCOSAL

## 2018-01-21 MED ORDER — MIDAZOLAM HCL 2 MG/2ML IJ SOLN
INTRAMUSCULAR | Status: AC
  Filled 2018-01-21: qty 2

## 2018-01-21 MED ORDER — DEXTROSE 50 % IV SOLN
INTRAVENOUS | Status: AC
  Filled 2018-01-21: qty 50

## 2018-01-21 MED ORDER — ORAL CARE MOUTH RINSE
15.0000 mL | Freq: Four times a day (QID) | OROMUCOSAL | Status: DC
  Administered 2018-01-21 – 2018-01-22 (×5): 15 mL via OROMUCOSAL

## 2018-01-21 MED ORDER — INSULIN DETEMIR 100 UNIT/ML ~~LOC~~ SOLN
18.0000 [IU] | Freq: Two times a day (BID) | SUBCUTANEOUS | Status: DC
  Administered 2018-01-21: 18 [IU] via SUBCUTANEOUS
  Filled 2018-01-21 (×2): qty 0.18

## 2018-01-21 NOTE — Progress Notes (Signed)
PULMONARY / CRITICAL CARE MEDICINE   Name: [name] MRN: [id number] DOB: 06/01/1986    ADMISSION DATE:  01/20/2018 CONSULTATION DATE:  01/20/2018  REFERRING MD:  Dr. August Saucer  CHIEF COMPLAINT:  DKA  HISTORY OF PRESENT ILLNESS:   32 year old male with PMH of Asthma, Anemia, 1DM, GERD, HTN, Schizoaffective disorder, depression with multiple admissions secondary to medical non-compliance. Most recently admitted 1/28-1/31   Presents to ED on 2/3 with recurrent DKA. Upon arrival patient is hypotensive, hyperglycemic, and unresponsive. Per EMS patient found with crushed pills around him. Roommate called EMS, however, unsure how long patient has been unresponsive. VBG with pH of 6.6. Glucose 1175. Started on Insulin and Bicarb gtt, given one liter of NS. Intubated for airway protection. PCCM asked to admit.     SUBJECTIVE:  Still on levophed but down to 63mcg/min now.  Propofol currently being d/c'd and will use fentanyl instead. Continues on insulin gtt.  VITAL SIGNS: BP 123/67   Pulse (!) 117   Temp 98.4 F (36.9 C)   Resp (!) 26   Ht 6' (1.829 m) Comment: measured  SpO2 100%   BMI 19.61 kg/m   HEMODYNAMICS:    VENTILATOR SETTINGS: Vent Mode: PRVC FiO2 (%):  [40 %-100 %] 40 % Set Rate:  [16 bmp-26 bmp] 26 bmp Vt Set:  [300 mL] 620 mL PEEP:  [5 cmH20] 5 cmH20 Plateau Pressure:  [11 cmH20-16 cmH20] 13 cmH20  INTAKE / OUTPUT: I/O last 3 completed shifts: In: 2750.1 [I.V.:2640.1; IV Piggyback:110] Out: 2600 [Urine:2600]  PHYSICAL EXAMINATION: General:  Thin adult male, on vent  Neuro:  Sedated, does not follow commands HEENT:  ETT in place  Cardiovascular:  RRR, no MRG  Lungs:  Clear breath sounds, no wheeze/crackles  Abdomen:  Non-distended, active bowel sounds  Musculoskeletal:  No deformities, no edema Skin:  Left Diabetic foot ulcer to sole  LABS:  BMET Recent Labs  Lab 01/20/18 2324 01/21/18 0207 01/21/18 0448  NA 140 142 142  K 4.8 3.6 3.1*  CL 108  116* 112*  CO2 <7* <7* 7*  BUN 45* 44* 43*  CREATININE 3.21* 3.16* 2.99*  GLUCOSE 883* 734* 556*    Electrolytes Recent Labs  Lab 01/14/18 2047  01/16/18 0503  01/20/18 2324 01/21/18 0207 01/21/18 0448  CALCIUM  --    < > 8.3*   < > 7.3* 7.2* 7.4*  MG 2.3  --  1.6*  --  2.7*  --  2.0  PHOS 7.3*  --  1.7*  --   --   --  <1.0*   < > = values in this interval not displayed.    CBC Recent Labs  Lab 01/14/18 1754 01/14/18 1806 01/20/18 1812  WBC 25.7*  --  42.5*  HGB 12.8* 13.9 11.2*  HCT 38.6* 41.0 37.3*  PLT 561*  --  477*    Coag's Recent Labs  Lab 01/14/18 2047  APTT 33  INR 1.28    Sepsis Markers Recent Labs  Lab 01/14/18 2047  01/15/18 0202 01/20/18 2324 01/21/18 0142  LATICACIDVEN  --    < > 1.9 1.8 1.8  PROCALCITON 0.55  --   --  3.83  --    < > = values in this interval not displayed.    ABG Recent Labs  Lab 01/15/18 0037 01/15/18 0350 01/21/18 0705  PHART 7.209* 7.334* 7.207*  PCO2ART  --  23.0* 23.0*  PO2ART 39.6* 77.0* 206.0*    Liver Enzymes Recent Labs  Lab 01/14/18 1754  AST 17  ALT 18  ALKPHOS 152*  BILITOT 1.5*  ALBUMIN 4.1    Cardiac Enzymes No results for input(s): TROPONINI, PROBNP in the last 168 hours.  Glucose Recent Labs  Lab 01/21/18 0238 01/21/18 0346 01/21/18 0452 01/21/18 0551 01/21/18 0648 01/21/18 0848  GLUCAP >600* 597* 499* 493* 446* 330*    Imaging Dg Chest Portable 1 View  Result Date: 01/20/2018 CLINICAL DATA:  Intubation. EXAM: PORTABLE CHEST 1 VIEW COMPARISON:  January 14, 2017 FINDINGS: The ETT is in good position. An NG tube terminates with the distal tip above the GE junction. Recommend advancement. No nodules or masses. No focal infiltrates. The cardiomediastinal silhouette is normal. IMPRESSION: 1. The NG tube distal tip is above the GE junction. Recommend advancement before use. 2. The ETT is in good position. 3. The chest is otherwise normal. Electronically Signed   By: Gerome Sam  III M.D   On: 01/20/2018 22:04   Dg Abd Portable 1v  Result Date: 01/21/2018 CLINICAL DATA:  Orogastric tube placement EXAM: PORTABLE ABDOMEN - 1 VIEW COMPARISON:  10/20/2017 FINDINGS: An orogastric tube tip and side-port overlaps the stomach. Nonobstructive bowel gas pattern. Visualized lungs are clear. IMPRESSION: Orogastric tube in good position. Electronically Signed   By: Marnee Spring M.D.   On: 01/21/2018 06:46     STUDIES:  CXR 2/3 > No acute, ETT is in good position, NG needs advancement   CULTURES: Blood 2/3 >> Urine 2/3 >> Sputum 2/3 >>  ANTIBIOTICS: Rocephin 2/3 x 1 Vanc 2/4 >  Zosyn 2/4 >   SIGNIFICANT EVENTS: 2/3 > Presents to ED   LINES/TUBES: ETT 2/3 >  L femoral CVL 2/3 >   DISCUSSION: 32 year old male with recurrent DKA secondary to non-compliance presents to ED with recurent DKA.  Required intubation and admitted to ICU.  Also concern for co-ingestion given "crushed pills" found by EMS (had positive osmolar gap of 19).  ASSESSMENT / PLAN:  PULMONARY A: Respiratory Insufficieny in setting of metabolic encephalopathy  H/O Asthma  P:   Continue full vent support Pulmonary Hygiene  VAP Bundle  Trend ABG/CXR  CARDIOVASCULAR A:  Hypovolemic Shock in setting of dehydration and DKA - improving.  Of note, was started on levophed overnight, currently being weaned Hypertriglyceridemia - Trigs 1,594  H/O HTN  P:  Cardiac Monitoring Continue to wean Levophed to off, goal MAP >65  Switching propofol to fentanyl due to hypertriglyceridemia as well as hopes to continue to wean pressors Continue IV Fluids   RENAL A:   Severe Anion Gap Metabolic Acidosis in setting of DKA - improving Acute Kidney Injury Hypokalemia - s/p repletion Hypophosphatemia - s/p repletion P:   Trend BMP q4H NS @ 125 ml/hr, change to D5 1/2 when glucose <250  Correct electrolytes as indicated  GASTROINTESTINAL A:   SUP H/O Gastroparesis  P:   PPI NPO  HEMATOLOGIC A:    Normocytic Anemia  VTE prophylaxis P:  Transfuse for Hbg <7  Heparin SQ and SCDs  Trend CBC   INFECTIOUS A:   Leukocytosis with hypothermia - PCT 3.8 on admit.  Received 1 dose ceftriaxone in ED Chronic Left Diabetic Foot Ulcer  P:   Will add vanc / zosyn for now F/u on UA  ENDOCRINE A:   DKA H/O Noncompliant Type 1DM    P:   DKA protocol  Continue Insulin gtt per protocol  Trend Glucose and BMP  NEUROLOGIC A:   Metabolic Encephalopathy  ?  Co-ingestion - per EMS had "crushed pills" on site.  Osmolar gap also positive (19) H/O Schizoaffective Disorder on Trazodone, Depression   P:   RASS goal: 0/-1 Fentanyl gtt / versed PRN UDS pending  Will need psych consult once extubated (of note, pt has hx of getting very anxious and frustrated when psych has been mentioned on prior admits, refused for them to see him in the past).  FAMILY  - Updates: No family at bedside 2/3, 2/4.  - Inter-disciplinary family meet or Palliative Care meeting due by: 01/27/2018   CC time: 30 min.   Rutherford Guys, [place] - [facility] [place] [facility] Pager: [phone]  or [phone] 01/21/2018, 9:30 AM

## 2018-01-21 NOTE — Progress Notes (Signed)
Pharmacy Antibiotic Note  [name] is a 32 y.o. male admitted on 01/20/2018 with sepsis.  Pharmacy has been consulted for Vancomycin and Zosyn dosing. WBC elevated 42.5. SCr improving some from 3.6 to 2.99. PCT 3.83. LA 1.8.   Plan: Zosyn 3.375g IV every 8 hours- 4 hr infusion.  Vancomycin 1250mg  IV x1 then 1g IV every 24 hours.  Monitor renal function, culture result, and clinical status.   Height: 6' (182.9 cm)(measured) IBW/kg (Calculated) : 77.6  Temp (24hrs), Avg:95.7 F (35.4 C), Min:87.1 F (30.6 C), Max:98.8 F (37.1 C)  Recent Labs  Lab 01/14/18 1754  01/14/18 2338 01/15/18 0202  01/16/18 1233 01/20/18 1812 01/20/18 1855 01/20/18 2324 01/21/18 0142 01/21/18 0207 01/21/18 0448  WBC 25.7*  --   --   --   --   --  42.5*  --   --   --   --   --   CREATININE 2.04*   < > 1.82* 1.67*   < > 0.81  --  3.60* 3.21*  --  3.16* 2.99*  LATICACIDVEN  --   --  2.6* 1.9  --   --   --   --  1.8 1.8  --   --    < > = values in this interval not displayed.    Estimated Creatinine Clearance: 32.6 mL/min (A) (by C-G formula based on SCr of 2.99 mg/dL (H)).    Allergies  Allergen Reactions  . Sulfa Antibiotics Other (See Comments)    Unknown childhood allergy  . Fentanyl Other (See Comments)    Patient had hallucinations with the medication    Antimicrobials this admission: Vancomycin 2/4>> Zosyn 2/4 >>  Dose adjustments this admission:  Microbiology results: 2/3 BCx: 2/3 Sputum:   Thank you for allowing pharmacy to be a part of this patient's care.  03/21/18, [name], BCPS, BCCCP Clinical Pharmacist Clinical phone 01/21/2018 until 3:[phone] After hours, please call #[phone] 01/21/2018 9:29 AM

## 2018-01-21 NOTE — Progress Notes (Signed)
Pt w/ waning BP after Levophed off. Gtt back on with good response. Dr. Kendrick Fries informed and orders recvd. Will wean off Levophed after bolus.

## 2018-01-21 NOTE — Progress Notes (Signed)
eLink Physician-Brief Progress Note Patient Name: [name] DOB: [birth year] MRN: [id number]   Date of Service  01/21/2018  HPI/Events of Note  K+ = 3.1, PO4--- < 1.0 and Creatinine = 2.99. Admitted with DKA on insulin IV infusion.   eICU Interventions  Will replace K+ and PO4---.     Intervention Category Major Interventions: Electrolyte abnormality - evaluation and management  Sommer,Steven Eugene 01/21/2018, 6:52 AM

## 2018-01-22 ENCOUNTER — Inpatient Hospital Stay (HOSPITAL_COMMUNITY): Payer: Medicare Other

## 2018-01-22 DIAGNOSIS — L8990 Pressure ulcer of unspecified site, unspecified stage: Secondary | ICD-10-CM

## 2018-01-22 LAB — GLUCOSE, CAPILLARY
GLUCOSE-CAPILLARY: 152 mg/dL — AB (ref 65–99)
GLUCOSE-CAPILLARY: 193 mg/dL — AB (ref 65–99)
GLUCOSE-CAPILLARY: 213 mg/dL — AB (ref 65–99)
GLUCOSE-CAPILLARY: 67 mg/dL (ref 65–99)
GLUCOSE-CAPILLARY: 75 mg/dL (ref 65–99)
GLUCOSE-CAPILLARY: 87 mg/dL (ref 65–99)
Glucose-Capillary: 44 mg/dL — CL (ref 65–99)
Glucose-Capillary: 112 mg/dL — ABNORMAL HIGH (ref 65–99)
Glucose-Capillary: 102 mg/dL — ABNORMAL HIGH (ref 65–99)

## 2018-01-22 LAB — TRIGLYCERIDES: Triglycerides: 131 mg/dL (ref <150)

## 2018-01-22 LAB — BASIC METABOLIC PANEL
ANION GAP: 11 (ref 5–15)
BUN: 32 mg/dL — ABNORMAL HIGH (ref 6–20)
CALCIUM: 7.3 mg/dL — AB (ref 8.9–10.3)
CO2: 17 mmol/L — ABNORMAL LOW (ref 22–32)
Chloride: 121 mmol/L — ABNORMAL HIGH (ref 101–111)
Creatinine, Ser: 1.93 mg/dL — ABNORMAL HIGH (ref 0.61–1.24)
GFR, EST AFRICAN AMERICAN: 51 mL/min — AB (ref >60)
GFR, EST NON AFRICAN AMERICAN: 44 mL/min — AB (ref >60)
Glucose, Bld: 158 mg/dL — ABNORMAL HIGH (ref 65–99)
Potassium: 4.1 mmol/L (ref 3.5–5.1)
SODIUM: 149 mmol/L — AB (ref 135–145)

## 2018-01-22 LAB — PHOSPHORUS: PHOSPHORUS: 2.9 mg/dL (ref 2.5–4.6)

## 2018-01-22 LAB — CBC
HCT: 26.1 % — ABNORMAL LOW (ref 39.0–52.0)
HEMOGLOBIN: 9.1 g/dL — AB (ref 13.0–17.0)
MCH: 29.3 pg (ref 26.0–34.0)
MCHC: 34.9 g/dL (ref 30.0–36.0)
MCV: 83.9 fL (ref 78.0–100.0)
PLATELETS: 186 10*3/uL (ref 150–400)
RBC: 3.11 MIL/uL — AB (ref 4.22–5.81)
RDW: 14.5 % (ref 11.5–15.5)
WBC: 13.3 10*3/uL — AB (ref 4.0–10.5)

## 2018-01-22 LAB — LIPASE, BLOOD: LIPASE: 35 U/L (ref 11–51)

## 2018-01-22 LAB — MAGNESIUM: MAGNESIUM: 1.6 mg/dL — AB (ref 1.7–2.4)

## 2018-01-22 LAB — PROCALCITONIN: PROCALCITONIN: 3.04 ng/mL

## 2018-01-22 MED ORDER — SODIUM CHLORIDE 0.9% FLUSH
10.0000 mL | Freq: Two times a day (BID) | INTRAVENOUS | Status: DC
  Administered 2018-01-22 – 2018-02-01 (×8): 10 mL

## 2018-01-22 MED ORDER — INSULIN DETEMIR 100 UNIT/ML ~~LOC~~ SOLN
18.0000 [IU] | Freq: Two times a day (BID) | SUBCUTANEOUS | Status: DC
  Administered 2018-01-23: 18 [IU] via SUBCUTANEOUS
  Filled 2018-01-22 (×2): qty 0.18

## 2018-01-22 MED ORDER — INSULIN ASPART 100 UNIT/ML ~~LOC~~ SOLN
0.0000 [IU] | Freq: Three times a day (TID) | SUBCUTANEOUS | Status: DC
  Administered 2018-01-22: 5 [IU] via SUBCUTANEOUS
  Administered 2018-01-23: 2 [IU] via SUBCUTANEOUS
  Administered 2018-01-25: 15 [IU] via SUBCUTANEOUS
  Administered 2018-01-26 – 2018-01-27 (×3): 2 [IU] via SUBCUTANEOUS

## 2018-01-22 MED ORDER — INSULIN ASPART 100 UNIT/ML ~~LOC~~ SOLN
6.0000 [IU] | Freq: Three times a day (TID) | SUBCUTANEOUS | Status: DC
  Administered 2018-01-22: 6 [IU] via SUBCUTANEOUS

## 2018-01-22 MED ORDER — INSULIN ASPART 100 UNIT/ML ~~LOC~~ SOLN
0.0000 [IU] | Freq: Every day | SUBCUTANEOUS | Status: DC
  Administered 2018-01-27: 3 [IU] via SUBCUTANEOUS
  Administered 2018-01-28 – 2018-01-30 (×2): 2 [IU] via SUBCUTANEOUS
  Administered 2018-01-30: 3 [IU] via SUBCUTANEOUS
  Administered 2018-01-31: 5 [IU] via SUBCUTANEOUS

## 2018-01-22 MED ORDER — PANTOPRAZOLE SODIUM 40 MG PO PACK
40.0000 mg | PACK | Freq: Every day | ORAL | Status: DC
  Administered 2018-01-24: 40 mg
  Filled 2018-01-22 (×3): qty 20

## 2018-01-22 MED ORDER — PANTOPRAZOLE SODIUM 40 MG PO TBEC: 40.0000 mg | DELAYED_RELEASE_TABLET | Freq: Every day | ORAL | Status: DC

## 2018-01-22 MED ORDER — CHLORHEXIDINE GLUCONATE CLOTH 2 % EX PADS
6.0000 | MEDICATED_PAD | Freq: Every day | CUTANEOUS | Status: DC
  Administered 2018-01-23 – 2018-02-01 (×7): 6 via TOPICAL

## 2018-01-22 MED ORDER — MAGNESIUM SULFATE 2 GM/50ML IV SOLN
2.0000 g | Freq: Once | INTRAVENOUS | Status: AC
Start: 2018-01-22 — End: 2018-01-22
  Administered 2018-01-22: 2 g via INTRAVENOUS
  Filled 2018-01-22: qty 50

## 2018-01-22 MED ORDER — DEXTROSE 50 % IV SOLN
INTRAVENOUS | Status: AC
  Filled 2018-01-22: qty 50

## 2018-01-22 MED ORDER — FENTANYL CITRATE (PF) 100 MCG/2ML IJ SOLN
50.0000 ug | Freq: Once | INTRAMUSCULAR | Status: AC
  Administered 2018-01-22: 50 ug via INTRAVENOUS
  Filled 2018-01-22: qty 2

## 2018-01-22 MED ORDER — SODIUM CHLORIDE 0.9% FLUSH: 10.0000 mL | INTRAVENOUS | Status: DC | PRN

## 2018-01-22 MED ORDER — DEXTROSE 50 % IV SOLN
50.0000 mL | Freq: Once | INTRAVENOUS | Status: AC
  Administered 2018-01-22: 50 mL via INTRAVENOUS

## 2018-01-22 NOTE — Progress Notes (Addendum)
Inpatient Diabetes Program Recommendations  AACE/ADA: New Consensus Statement on Inpatient Glycemic Control (2015)  Target Ranges:  Prepandial:   less than 140 mg/dL      Peak postprandial:   less than 180 mg/dL (1-2 hours)      Critically ill patients:  140 - 180 mg/dL  Results for RUAIRI, STUTSMAN (MRN [id number]) as of 01/22/2018 07:35  Ref. Range 01/22/2018 03:37  Glucose-Capillary Latest Ref Range: 65 - 99 mg/dL 035 (H)   Results for EBEN, CHOINSKI (MRN [id number]) as of 01/22/2018 07:35  Ref. Range 01/21/2018 15:36 01/21/2018 16:55 01/21/2018 18:03 01/21/2018 18:23 01/21/2018 19:56 01/21/2018 21:41 01/22/2018 00:06  Glucose-Capillary Latest Ref Range: 65 - 99 mg/dL 170 (H) 92 70 017 (H) 90 155 (H) 193 (H)  Results for FLEETWOOD, PIERRON (MRN [id number]) as of 01/22/2018 07:35  Ref. Range 01/20/2018 18:55 01/20/2018 23:24  Beta-Hydroxybutyric Acid Latest Ref Range: 0.05 - 0.27 mmol/L  >8.00 (H)  Glucose Latest Ref Range: 65 - 99 mg/dL 1,638 (HH) 466 (HH)   Review of Glycemic Control  Diabetes history:DM1 (makes no insulin; will require basal, correction, and meal coverage insulin) Outpatient Diabetes medications:Levemir 18 units BID, Novolog 6 units TID with meals, Novolog 0-15 units QID Current orders for Inpatient glycemic control: Levemir 18 units BID, Novolog 0-15 units Q4H  NOTE: Note patient is still on ventilator at this time and patient was transitioned from IV to SQ insulin yesterday. Patient is well known to Inpatient Diabetes team as he has Type 1 DM and this is his 12th admission in the past 6 months. Since patient has Type 1 DM, he will require basal, correction, and meal coverage (when eating) insulin as he makes no insulin. Will continue to follow.  Thanks, Orlando Penner, RN, MSN, CDE Diabetes Coordinator Inpatient Diabetes Program [phone] (Team Pager from 8am to 5pm)

## 2018-01-22 NOTE — Progress Notes (Signed)
PULMONARY / CRITICAL CARE MEDICINE   Name: [name] MRN: [id number] DOB: 1986/12/11    ADMISSION DATE:  01/20/2018 CONSULTATION DATE:  01/20/2018  REFERRING MD:  Dr. August Saucer  CHIEF COMPLAINT:  DKA  HISTORY OF PRESENT ILLNESS:   32 year old male with PMH of Asthma, Anemia, 1DM, GERD, HTN, Schizoaffective disorder, depression with multiple admissions secondary to medical non-compliance. Most recently admitted 1/28-1/31   Presents to ED on 2/3 with recurrent DKA. Upon arrival patient is hypotensive, hyperglycemic, and unresponsive. Per EMS patient found with crushed pills around him. Roommate called EMS, however, unsure how long patient has been unresponsive. VBG with pH of 6.6. Glucose 1175. Started on Insulin and Bicarb gtt, given one liter of NS. Intubated for airway protection. PCCM asked to admit.     SUBJECTIVE:   No acute events overnight.  Failed SBT yesterday afternoon and this am x 2- failed to initiate breath PRN fentanyl/ versed overnight  VITAL SIGNS: BP 112/84 (BP Location: Right Leg)   Pulse (!) 115   Temp 98.8 F (37.1 C) (Core) Comment (Src): foley  Resp 14   Ht 6' (1.829 m) Comment: measured  SpO2 100%   BMI 19.61 kg/m   HEMODYNAMICS:    VENTILATOR SETTINGS: Vent Mode: PRVC FiO2 (%):  [30 %-40 %] 30 % Set Rate:  [14 bmp-26 bmp] 14 bmp Vt Set:  [620 mL] 620 mL PEEP:  [5 cmH20] 5 cmH20 Plateau Pressure:  [13 cmH20-14 cmH20] 14 cmH20  INTAKE / OUTPUT: I/O last 3 completed shifts: In: 5809.7 [I.V.:4154.7; IV Piggyback:1655] Out: 4840 [Urine:4840]  PHYSICAL EXAMINATION: General:  Critically ill thin male on MV HEENT: MM pink/moist, ETT/ OGT, pupils 3/reactive Neuro: Opens eyes to verbal, follows commands, some agitation- shaking head CV: s1s2 rrr, no m/r/g PULM: even/non-labored on MV, lungs bilaterally clear ZS:WFUX, non-tender, bs active  Extremities: warm/dry, no edema, left foot ulcer to sole Skin: no rashes  LABS:  BMET Recent Labs   Lab 01/21/18 1152 01/21/18 1655 01/22/18 0357  NA 145 150* 149*  K 2.2* 3.2* 4.1  CL 114* 120* 121*  CO2 13* 18* 17*  BUN 39* 37* 32*  CREATININE 2.54* 2.28* 1.93*  GLUCOSE 313* 102* 158*    Electrolytes Recent Labs  Lab 01/16/18 0503  01/20/18 2324  01/21/18 0448 01/21/18 1152 01/21/18 1655 01/22/18 0357  CALCIUM 8.3*   < > 7.3*   < > 7.4* 7.5* 7.5* 7.3*  MG 1.6*  --  2.7*  --  2.0  --   --  1.6*  PHOS 1.7*  --   --   --  <1.0*  --   --  2.9   < > = values in this interval not displayed.    CBC Recent Labs  Lab 01/20/18 1812 01/22/18 0456  WBC 42.5* 13.3*  HGB 11.2* 9.1*  HCT 37.3* 26.1*  PLT 477* 186    Coag's No results for input(s): APTT, INR in the last 168 hours.  Sepsis Markers Recent Labs  Lab 01/20/18 2324 01/21/18 0142  LATICACIDVEN 1.8 1.8  PROCALCITON 3.83  --     ABG Recent Labs  Lab 01/20/18 1825 01/20/18 2226 01/21/18 0705  PHART 6.620* 6.733* 7.207*  PCO2ART 15.5* 20.2* 23.0*  PO2ART 30.0* 643* 206.0*    Liver Enzymes No results for input(s): AST, ALT, ALKPHOS, BILITOT, ALBUMIN in the last 168 hours.  Cardiac Enzymes No results for input(s): TROPONINI, PROBNP in the last 168 hours.  Glucose Recent Labs  Lab 01/21/18  1823 01/21/18 1956 01/21/18 2141 01/22/18 0006 01/22/18 0337 01/22/18 0749  GLUCAP 100* 90 155* 193* 152* 75    Imaging Dg Chest Port 1 View  Result Date: 01/22/2018 CLINICAL DATA:  Respiratory failure. EXAM: PORTABLE CHEST 1 VIEW COMPARISON:  One-view chest x-ray 01/20/2018. FINDINGS: The endotracheal tube terminates 4 cm above the carina, in satisfactory position. The NG tube has been advanced and now terminates off the inferior border of the film. The lungs are clear. There is no edema or effusion. No pneumothorax is present. IMPRESSION: 1. Endotracheal tube is in stable and satisfactory position. 2. NG tube has been advanced. The side port is now below the inferior border of the film. 3. No acute  cardiopulmonary disease. Electronically Signed   By: Marin Roberts M.D.   On: 01/22/2018 06:58    STUDIES:  CXR 2/3 > No acute, ETT is in good position, NG needs advancement  CXR 2/5 >> tubes in satisfactory position; no acute  CULTURES: Blood 2/3 >> ngtd Sputum 2/3 >>  ANTIBIOTICS: Rocephin 2/3 x 1 Vanc 2/4 >  Zosyn 2/4 >   SIGNIFICANT EVENTS: 2/3 > Presents to ED   LINES/TUBES: ETT 2/3 >  L femoral CVL 2/3 >   DISCUSSION: 32 year old male with recurrent DKA secondary to non-compliance presents to ED with recurent DKA.  Required intubation and admitted to ICU.  Also concern for co-ingestion given "crushed pills" found by EMS (had positive osmolar gap of 19).  ASSESSMENT / PLAN:  PULMONARY A: Respiratory Insufficieny in setting of metabolic encephalopathy  H/O Asthma  P:   Continue full vent support Pulmonary Hygiene  VAP Bundle  Trend ABG/CXR Limit sedation, repeat SBT, hopeful to extubate this afternoon  CARDIOVASCULAR A:  Hypovolemic Shock in setting of dehydration and DKA - resolved Hypertriglyceridemia - Trigs 1,594  H/O HTN  P:  Cardiac Monitoring goal MAP >65  Recheck triglycerides  Attempt PIV to d/c femoral line now that he is off pressors  RENAL A:   Severe Anion Gap Metabolic Acidosis in setting of DKA - improving Acute Kidney Injury- improving Hypokalemia -  Hypophosphatemia - hypomag P:   Mag 2gm x 1 now D5/ 0.45 NS w/ KCL 20 meq at 75 ml/hr Trend BMP / urinary output Replace electrolytes as indicated  GASTROINTESTINAL A:   SUP H/O Gastroparesis  ? R/o pancreatitis P:   PPI NPO Check lipase in the setting of high triglycerides    HEMATOLOGIC A:   Normocytic Anemia  VTE prophylaxis P:  Transfuse for Hbg <7  Heparin SQ and SCDs  Trend CBC   INFECTIOUS A:   Leukocytosis with hypothermia - PCT 3.8 on admit.  Received 1 dose ceftriaxone in ED Chronic Left Diabetic Foot Ulcer  - PCT 3.83 on 2/3 - afebrile, WBC  improving  P:   Continue vanc / zosyn Check PCT to limit abx exposure Check lipase Follow blood cultures   ENDOCRINE A:   DKA H/O Noncompliant Type 1DM    - AG closed P:    CBG q 4 SSI levemir 18 units BID   NEUROLOGIC A:   Metabolic Encephalopathy  ? Co-ingestion - per EMS had "crushed pills" on site.  Osmolar gap also positive (19) H/O Schizoaffective Disorder on Trazodone, Depression   P:   RASS goal: 0/-1 Fentanyl prn, d/c versed PRN, limit as able to facilitate extubation UDS not sent Will need psych consult once extubated (of note, pt has hx of getting very anxious and frustrated when  psych has been mentioned on prior admits, refused for them to see him in the past).  FAMILY  - Updates: No family at bedside 2/3, 2/4, 2/5.  Mother apparently lives in [place] and called 2/4 PM to check on patient.  - Inter-disciplinary family meet or Palliative Care meeting due by: 01/27/2018   CC time: 30 min.  Posey Boyer, AGACNP-BC [facility] Pgr: [phone] or if no answer [phone] 01/22/2018, 8:22 AM

## 2018-01-22 NOTE — Progress Notes (Signed)
Hypoglycemic Event  CBG: 67  Treatment: 4 oz orange juice  Symptoms: lethargy, diaphoretic   Follow-up CBG: Time: 2206 CBG Result: 102   Shaquile Lutze S Luccia Reinheimer

## 2018-01-22 NOTE — Progress Notes (Signed)
eLink Physician-Brief Progress Note Patient Name: [name] DOB: 06/11/1986 MRN: [id number]   Date of Service  01/22/2018  HPI/Events of Note  Agitation   eICU Interventions  Will order: 1. Fentanyl 50 mcg IV X 1 now.      Intervention Category Minor Interventions: Agitation / anxiety - evaluation and management  Braelyn Bordonaro Eugene 01/22/2018, 2:32 AM

## 2018-01-22 NOTE — Progress Notes (Signed)
Peripherally Inserted Central Catheter/Midline Placement  The IV Nurse has discussed with the patient and/or persons authorized to consent for the patient, the purpose of this procedure and the potential benefits and risks involved with this procedure.  The benefits include less needle sticks, lab draws from the catheter, and the patient may be discharged home with the catheter. Risks include, but not limited to, infection, bleeding, blood clot (thrombus formation), and puncture of an artery; nerve damage and irregular heartbeat and possibility to perform a PICC exchange if needed/ordered by physician.  Alternatives to this procedure were also discussed.  Bard Power PICC patient education guide, fact sheet on infection prevention and patient information card has been provided to patient /or left at bedside.    PICC/Midline Placement Documentation   Midline placed in the right upper arm in the basilic vein. 18gx8cm.     Romie Jumper 01/22/2018, 10:49 AM

## 2018-01-22 NOTE — Procedures (Signed)
Extubation Procedure Note  Patient Details:   Name: [name] DOB: 1986/09/15 MRN: [id number]   Airway Documentation:     Evaluation  O2 sats: stable throughout Complications: No apparent complications Patient did tolerate procedure well. Bilateral Breath Sounds: Clear   Yes  Placed on 2l/min [place]  Newt Lukes 01/22/2018, 12:23 PM

## 2018-01-23 ENCOUNTER — Inpatient Hospital Stay (HOSPITAL_COMMUNITY): Payer: Medicare Other

## 2018-01-23 DIAGNOSIS — Z765 Malingerer [conscious simulation]: Secondary | ICD-10-CM

## 2018-01-23 DIAGNOSIS — I5032 Chronic diastolic (congestive) heart failure: Secondary | ICD-10-CM

## 2018-01-23 DIAGNOSIS — R079 Chest pain, unspecified: Secondary | ICD-10-CM

## 2018-01-23 DIAGNOSIS — R571 Hypovolemic shock: Secondary | ICD-10-CM

## 2018-01-23 DIAGNOSIS — E876 Hypokalemia: Secondary | ICD-10-CM

## 2018-01-23 DIAGNOSIS — I10 Essential (primary) hypertension: Secondary | ICD-10-CM

## 2018-01-23 DIAGNOSIS — R4182 Altered mental status, unspecified: Secondary | ICD-10-CM

## 2018-01-23 DIAGNOSIS — Z79899 Other long term (current) drug therapy: Secondary | ICD-10-CM

## 2018-01-23 DIAGNOSIS — E1065 Type 1 diabetes mellitus with hyperglycemia: Secondary | ICD-10-CM

## 2018-01-23 DIAGNOSIS — Z794 Long term (current) use of insulin: Secondary | ICD-10-CM

## 2018-01-23 DIAGNOSIS — N179 Acute kidney failure, unspecified: Secondary | ICD-10-CM

## 2018-01-23 DIAGNOSIS — F250 Schizoaffective disorder, bipolar type: Secondary | ICD-10-CM

## 2018-01-23 DIAGNOSIS — E7849 Other hyperlipidemia: Secondary | ICD-10-CM

## 2018-01-23 DIAGNOSIS — D72828 Other elevated white blood cell count: Secondary | ICD-10-CM

## 2018-01-23 DIAGNOSIS — E108 Type 1 diabetes mellitus with unspecified complications: Secondary | ICD-10-CM

## 2018-01-23 DIAGNOSIS — G4700 Insomnia, unspecified: Secondary | ICD-10-CM

## 2018-01-23 LAB — RENAL FUNCTION PANEL
ANION GAP: 9 (ref 5–15)
Albumin: 2.2 g/dL — ABNORMAL LOW (ref 3.5–5.0)
BUN: 19 mg/dL (ref 6–20)
CHLORIDE: 119 mmol/L — AB (ref 101–111)
CO2: 19 mmol/L — ABNORMAL LOW (ref 22–32)
Calcium: 7.7 mg/dL — ABNORMAL LOW (ref 8.9–10.3)
Creatinine, Ser: 1.34 mg/dL — ABNORMAL HIGH (ref 0.61–1.24)
GFR calc Af Amer: >60 mL/min (ref >60)
GFR calc non Af Amer: >60 mL/min (ref >60)
GLUCOSE: 141 mg/dL — AB (ref 65–99)
PHOSPHORUS: 2 mg/dL — AB (ref 2.5–4.6)
Potassium: 3.4 mmol/L — ABNORMAL LOW (ref 3.5–5.1)
Sodium: 147 mmol/L — ABNORMAL HIGH (ref 135–145)

## 2018-01-23 LAB — IRON AND TIBC
IRON: 34 ug/dL — AB (ref 45–182)
Saturation Ratios: 20 % (ref 17.9–39.5)
TIBC: 174 ug/dL — AB (ref 250–450)
UIBC: 140 ug/dL

## 2018-01-23 LAB — HEPATIC FUNCTION PANEL
ALBUMIN: 2.1 g/dL — AB (ref 3.5–5.0)
ALK PHOS: 100 U/L (ref 38–126)
ALT: 24 U/L (ref 17–63)
AST: 45 U/L — AB (ref 15–41)
BILIRUBIN DIRECT: 0.1 mg/dL (ref 0.1–0.5)
Indirect Bilirubin: 0.2 mg/dL — ABNORMAL LOW (ref 0.3–0.9)
Total Bilirubin: 0.3 mg/dL (ref 0.3–1.2)
Total Protein: 4.1 g/dL — ABNORMAL LOW (ref 6.5–8.1)

## 2018-01-23 LAB — LIPID PANEL
CHOLESTEROL: 115 mg/dL (ref 0–200)
HDL: 50 mg/dL (ref >40)
LDL CALC: 51 mg/dL (ref 0–99)
TRIGLYCERIDES: 70 mg/dL (ref <150)
Total CHOL/HDL Ratio: 2.3 RATIO
VLDL: 14 mg/dL (ref 0–40)

## 2018-01-23 LAB — GLUCOSE, CAPILLARY
GLUCOSE-CAPILLARY: 131 mg/dL — AB (ref 65–99)
GLUCOSE-CAPILLARY: 139 mg/dL — AB (ref 65–99)
GLUCOSE-CAPILLARY: 155 mg/dL — AB (ref 65–99)
GLUCOSE-CAPILLARY: 44 mg/dL — AB (ref 65–99)
GLUCOSE-CAPILLARY: 65 mg/dL (ref 65–99)
GLUCOSE-CAPILLARY: 75 mg/dL (ref 65–99)
Glucose-Capillary: 37 mg/dL — CL (ref 65–99)
Glucose-Capillary: 59 mg/dL — ABNORMAL LOW (ref 65–99)
Glucose-Capillary: 130 mg/dL — ABNORMAL HIGH (ref 65–99)
Glucose-Capillary: 72 mg/dL (ref 65–99)

## 2018-01-23 LAB — CBC
HEMATOCRIT: 23.8 % — AB (ref 39.0–52.0)
HEMOGLOBIN: 8.1 g/dL — AB (ref 13.0–17.0)
MCH: 28.9 pg (ref 26.0–34.0)
MCHC: 34 g/dL (ref 30.0–36.0)
MCV: 85 fL (ref 78.0–100.0)
Platelets: 136 10*3/uL — ABNORMAL LOW (ref 150–400)
RBC: 2.8 MIL/uL — ABNORMAL LOW (ref 4.22–5.81)
RDW: 15.1 % (ref 11.5–15.5)
WBC: 9.1 10*3/uL (ref 4.0–10.5)

## 2018-01-23 LAB — RETICULOCYTES
RBC.: 2.89 MIL/uL — ABNORMAL LOW (ref 4.22–5.81)
Retic Count, Absolute: 14.5 10*3/uL — ABNORMAL LOW (ref 19.0–186.0)
Retic Ct Pct: 0.5 % (ref 0.4–3.1)

## 2018-01-23 LAB — FERRITIN: FERRITIN: 236 ng/mL (ref 24–336)

## 2018-01-23 LAB — VITAMIN B12: Vitamin B-12: 825 pg/mL (ref 180–914)

## 2018-01-23 LAB — TROPONIN I
Troponin I: 0.04 ng/mL (ref <0.03)
Troponin I: 0.04 ng/mL (ref <0.03)

## 2018-01-23 LAB — FOLATE: Folate: 3.3 ng/mL — ABNORMAL LOW (ref >5.9)

## 2018-01-23 LAB — D-DIMER, QUANTITATIVE: D-Dimer, Quant: 1.6 ug/mL-FEU — ABNORMAL HIGH (ref 0.00–0.50)

## 2018-01-23 LAB — POTASSIUM: Potassium: 4.2 mmol/L (ref 3.5–5.1)

## 2018-01-23 LAB — MAGNESIUM: Magnesium: 1.5 mg/dL — ABNORMAL LOW (ref 1.7–2.4)

## 2018-01-23 MED ORDER — DOXEPIN HCL 10 MG/ML PO CONC
10.0000 mg | Freq: Every day | ORAL | Status: DC
  Administered 2018-01-23 – 2018-02-01 (×10): 10 mg via ORAL
  Filled 2018-01-23 (×10): qty 1

## 2018-01-23 MED ORDER — INSULIN ASPART 100 UNIT/ML ~~LOC~~ SOLN
4.0000 [IU] | Freq: Three times a day (TID) | SUBCUTANEOUS | Status: DC
  Administered 2018-01-24 – 2018-01-27 (×6): 4 [IU] via SUBCUTANEOUS

## 2018-01-23 MED ORDER — HEPARIN SODIUM (PORCINE) 5000 UNIT/ML IJ SOLN
5000.0000 [IU] | Freq: Three times a day (TID) | INTRAMUSCULAR | Status: AC
  Administered 2018-01-23 – 2018-01-30 (×16): 5000 [IU] via SUBCUTANEOUS
  Filled 2018-01-23 (×22): qty 1

## 2018-01-23 MED ORDER — POTASSIUM CHLORIDE CRYS ER 20 MEQ PO TBCR
50.0000 meq | EXTENDED_RELEASE_TABLET | Freq: Once | ORAL | Status: DC
  Filled 2018-01-23: qty 2

## 2018-01-23 MED ORDER — POTASSIUM CHLORIDE 10 MEQ/100ML IV SOLN: 10.0000 meq | INTRAVENOUS | Status: DC

## 2018-01-23 MED ORDER — POTASSIUM CHLORIDE 20 MEQ/15ML (10%) PO SOLN
50.0000 meq | Freq: Every day | ORAL | Status: DC
  Administered 2018-01-23 – 2018-02-01 (×8): 50 meq via ORAL
  Filled 2018-01-23 (×10): qty 45

## 2018-01-23 MED ORDER — INSULIN DETEMIR 100 UNIT/ML ~~LOC~~ SOLN
15.0000 [IU] | Freq: Two times a day (BID) | SUBCUTANEOUS | Status: DC
  Administered 2018-01-23 – 2018-01-26 (×6): 15 [IU] via SUBCUTANEOUS
  Filled 2018-01-23 (×8): qty 0.15

## 2018-01-23 MED ORDER — HALOPERIDOL LACTATE 5 MG/ML IJ SOLN
2.0000 mg | Freq: Four times a day (QID) | INTRAMUSCULAR | Status: DC
  Administered 2018-01-23 – 2018-01-24 (×3): 2 mg via INTRAVENOUS
  Filled 2018-01-23 (×2): qty 0.4
  Filled 2018-01-23 (×3): qty 1
  Filled 2018-01-23: qty 0.4
  Filled 2018-01-23: qty 1

## 2018-01-23 MED ORDER — TRAZODONE HCL 50 MG PO TABS
150.0000 mg | ORAL_TABLET | Freq: Every day | ORAL | Status: DC
  Administered 2018-01-23 – 2018-02-01 (×10): 150 mg via ORAL
  Filled 2018-01-23 (×11): qty 1

## 2018-01-23 NOTE — Progress Notes (Signed)
cbg 257 done at 2121 resulted in glucometer but did not download into epic

## 2018-01-23 NOTE — Consult Note (Addendum)
[facility] Consult   Reason for Consult:  Possible overdose/schizophrenia Referring Physician:  Dr. Sherral Hammers Patient Identification: [name] MRN:  [id number] Principal Diagnosis: Altered mental status Diagnosis:   Patient Active Problem List   Diagnosis Date Noted  . Pressure injury of skin [L89.90] 01/22/2018  . Endotracheally intubated [Z97.8]   . Increased anion gap metabolic acidosis [E83.1]   . Metabolic encephalopathy [D17.61]   . Vision loss [H54.7] 12/10/2017  . DKA (diabetic ketoacidosis) ([place]) [E13.10] 11/03/2017  . Pressure injury of contiguous region involving back and buttock, stage 1 [L89.41] 10/11/2017  . Encephalopathy acute [G93.40]   . Hyperbilirubinemia [E80.6] 09/14/2017  . Heel ulcer ([place]) [Y07.371] 09/14/2017  . Noncompliance with medication regimen [Z91.14]   . Hypokalemia [E87.6]   . Non healing left heel wound [S91.302A]   . Cellulitis [L03.90]   . DKA, type 1 ([place]) [E10.10] 08/17/2017  . Encounter for imaging study to confirm orogastric (OG) tube placement [Z01.89]   . Acute respiratory acidosis [E87.2]   . DKA (diabetic ketoacidoses) ([place]) [E13.10] 08/04/2017  . Acute respiratory failure with hypoxia ([place]) [J96.01]   . Seizure ([place]) [R56.9]   . Diabetic ketoacidosis ([place]) [E13.10] 07/30/2017  . Normocytic anemia [D64.9] 07/05/2017  . Numbness of right hand [R20.0] 07/05/2017  . Elevated troponin I measurement [R74.8]   . Positive D dimer [R79.89]   . DKA, type 1, not at goal [facility]) [E10.10] 07/01/2017  . Hyperkalemia [E87.5]   . Acute kidney injury ([place]) [N17.9]   . Medically noncompliant [Z91.19]   . Hyperglycemia [R73.9] 06/16/2017  . Nausea & vomiting [R11.2] 06/15/2017  . Abdominal pain [R10.9] 06/15/2017  . Sepsis ([place]) [A41.9] 06/07/2017  . Chest pain [R07.9] 06/07/2017  . GERD (gastroesophageal reflux disease) [K21.9] 06/07/2017  . Substance abuse ([place]) [F19.10] 05/30/2016  . Substance induced mood disorder ([place])  [F19.94] 05/30/2016  . Tobacco use disorder [F17.200] 12/24/2015  . DM hyperosmolarity type I, uncontrolled ([place]) [E10.69, E10.65] 12/19/2015  . Generalized headache [R51] 07/22/2015  . Bilateral hip bursitis [M70.71, M70.72] 05/26/2015  . Depression [F32.9]   . Generalized anxiety disorder [F41.1] 03/29/2015  . Undifferentiated schizophrenia ([place]) [F20.3]   . Hip pain, bilateral [M25.551, M25.552] 11/18/2014  . Dehydration [E86.0] 06/17/2012    Total Time spent with patient: 1 hour  Subjective:   [name] is a 32 y.o. male patient admitted with recurrent DKA.  HPI:   Per chart review, patient presents with recurrent DKA. He has had 12 hospitalizations in the past 6 months. Patient's roommate called EMS after he was found unresponsive. Per EMS, patient was found with crushed pills around him so there is a concern for drug overdose. His hospital course has been complicated by metabolic encephalopathy. He was agitated yesterday and attempted to bite his sitter. He was given Versed for agitation.   Of note, patient was last seen by the psychiatry consult service on 12/18 for poor medication adherence in the setting of psychiatric illness. His mother reported that he was hearing voices that told him to not take his medications. Patient denied AVH and did not appear to be responding to internal stimuli. He was psychiatrically cleared with a plan to follow up with his outpatient provider. Home medications include Doxepin 10 mg qhs and Adderall 30 mg daily.   [name] reports that he last remembers using his computer and then he was in the hospital when he woke up. He is unsure why EMS found crushed pills around him on the floor.  He denies having recent thoughts of harming self. He denies current SI, HI or AVH. He denies abusing his stimulant. He denies any problems with appetite or sleep. He continue to enjoy gaming although he reports financial stressors due to court fees. He has been  taking his medications as prescribed. He provided his roommate's name to obtain collateral information and did not appear concerned about the information he would provide to the notewriter.   Past Psychiatric History: Schizoaffective disorder   Risk to Self: Is patient at risk for suicide?: No Risk to Others:  None. Denies HI.  Prior Inpatient Therapy:   Multiple hospitalizations for psychosis, anxiety and depression.  Prior Outpatient Therapy:  His medications are prescribed by Dr. Nolene Ebbs.  Past Medical History:  Past Medical History:  Diagnosis Date  . Anemia   . Anxiety   . Arthritis    "both hips; both shoulders" (11/05/2017)  . Chicken pox   . Childhood asthma   . Chronic pain   . Depression   . DKA (diabetic ketoacidoses) ([place]) 11/18/2014  . DKA (diabetic ketoacidoses) ([place]) 07/05/2017  . GERD (gastroesophageal reflux disease)   . Hypertension   . Migraine    "a few/year" (06/06/2017)  . Noncompliance with medication regimen   . Pneumonia    "several times" (06/06/2017)  . Polysubstance abuse ([place])   . Schizo affective schizophrenia ([place])   . Scoliosis   . Type I diabetes mellitus ([place])     Past Surgical History:  Procedure Laterality Date  . CARDIAC CATHETERIZATION  11/2016  . INCISION AND DRAINAGE ABSCESS Left 11/2011   "MRSA removed off my thumb"   Family History:  Family History  Problem Relation Age of Onset  . Diabetes Mother    Family Psychiatric  History: Maternal aunt-dementia  Social History:  Social History   Substance and Sexual Activity  Alcohol Use No     Social History   Substance and Sexual Activity  Drug Use Yes  . Types: Marijuana   Comment: 11/05/2017 "nonesince 2012"; pt denies hx of cocaine and methamphetamines use on 11/05/2017    Social History   Socioeconomic History  . Marital status: Legally Separated    Spouse name: None  . Number of children: 0  . Years of education: 11  . Highest education level: None  Social  Needs  . Financial resource strain: None  . Food insecurity - worry: None  . Food insecurity - inability: None  . Transportation needs - medical: None  . Transportation needs - non-medical: None  Occupational History  . Occupation: Disability  Tobacco Use  . Smoking status: Current Every Day Smoker    Packs/day: 0.50    Years: 9.00    Pack years: 4.50    Types: Cigarettes  . Smokeless tobacco: Never Used  Substance and Sexual Activity  . Alcohol use: No  . Drug use: Yes    Types: Marijuana    Comment: 11/05/2017 "nonesince 2012"; pt denies hx of cocaine and methamphetamines use on 11/05/2017  . Sexual activity: Not Currently  Other Topics Concern  . None  Social History Narrative   ** Merged History Encounter **    Fun: Video games   Additional Social History: He lives at home with a roommate. He is unemployed and receives disability for mental illness. He denies substance use.     Allergies:   Allergies  Allergen Reactions  . Sulfa Antibiotics Other (See Comments)    Unknown childhood allergy  . Fentanyl  Other (See Comments)    Patient had hallucinations with the medication    Labs:  Results for orders placed or performed during the hospital encounter of 01/20/18 (from the past 48 hour(s))  Glucose, capillary     Status: Abnormal   Collection Time: 01/21/18  1:41 PM  Result Value Ref Range   Glucose-Capillary 207 (H) 65 - 99 mg/dL  Glucose, capillary     Status: Abnormal   Collection Time: 01/21/18  2:30 PM  Result Value Ref Range   Glucose-Capillary 288 (H) 65 - 99 mg/dL   Comment 1 Venous Specimen   Glucose, capillary     Status: Abnormal   Collection Time: 01/21/18  3:36 PM  Result Value Ref Range   Glucose-Capillary 175 (H) 65 - 99 mg/dL  Basic metabolic panel     Status: Abnormal   Collection Time: 01/21/18  4:55 PM  Result Value Ref Range   Sodium 150 (H) 135 - 145 mmol/L   Potassium 3.2 (L) 3.5 - 5.1 mmol/L    Comment: NO VISIBLE HEMOLYSIS    Chloride 120 (H) 101 - 111 mmol/L   CO2 18 (L) 22 - 32 mmol/L   Glucose, Bld 102 (H) 65 - 99 mg/dL   BUN 37 (H) 6 - 20 mg/dL   Creatinine, Ser 2.28 (H) 0.61 - 1.24 mg/dL   Calcium 7.5 (L) 8.9 - 10.3 mg/dL   GFR calc non Af Amer 36 (L) >60 mL/min   GFR calc Af Amer 42 (L) >60 mL/min    Comment: (NOTE) The eGFR has been calculated using the CKD EPI equation. This calculation has not been validated in all clinical situations. eGFR's persistently <60 mL/min signify possible Chronic Kidney Disease.    Anion gap 12 5 - 15    Comment: Performed at [facility] [address], [place], [place] [address]  Glucose, capillary     Status: None   Collection Time: 01/21/18  4:55 PM  Result Value Ref Range   Glucose-Capillary 92 65 - 99 mg/dL   Comment 1 Venous Specimen   Glucose, capillary     Status: None   Collection Time: 01/21/18  6:03 PM  Result Value Ref Range   Glucose-Capillary 70 65 - 99 mg/dL   Comment 1 Venous Specimen   Glucose, capillary     Status: Abnormal   Collection Time: 01/21/18  6:23 PM  Result Value Ref Range   Glucose-Capillary 100 (H) 65 - 99 mg/dL  Glucose, capillary     Status: None   Collection Time: 01/21/18  7:56 PM  Result Value Ref Range   Glucose-Capillary 90 65 - 99 mg/dL   Comment 1 Venous Specimen   Glucose, capillary     Status: Abnormal   Collection Time: 01/21/18  9:41 PM  Result Value Ref Range   Glucose-Capillary 155 (H) 65 - 99 mg/dL   Comment 1 Venous Specimen   Glucose, capillary     Status: Abnormal   Collection Time: 01/22/18 12:06 AM  Result Value Ref Range   Glucose-Capillary 193 (H) 65 - 99 mg/dL   Comment 1 Capillary Specimen    Comment 2 Notify RN   Glucose, capillary     Status: Abnormal   Collection Time: 01/22/18  3:37 AM  Result Value Ref Range   Glucose-Capillary 152 (H) 65 - 99 mg/dL   Comment 1 Capillary Specimen    Comment 2 Notify RN   Basic metabolic panel     Status: Abnormal  Collection Time: 01/22/18  3:57  AM  Result Value Ref Range   Sodium 149 (H) 135 - 145 mmol/L   Potassium 4.1 3.5 - 5.1 mmol/L   Chloride 121 (H) 101 - 111 mmol/L   CO2 17 (L) 22 - 32 mmol/L   Glucose, Bld 158 (H) 65 - 99 mg/dL   BUN 32 (H) 6 - 20 mg/dL   Creatinine, Ser 1.93 (H) 0.61 - 1.24 mg/dL   Calcium 7.3 (L) 8.9 - 10.3 mg/dL   GFR calc non Af Amer 44 (L) >60 mL/min   GFR calc Af Amer 51 (L) >60 mL/min    Comment: (NOTE) The eGFR has been calculated using the CKD EPI equation. This calculation has not been validated in all clinical situations. eGFR's persistently <60 mL/min signify possible Chronic Kidney Disease.    Anion gap 11 5 - 15    Comment: Performed at [facility] [address] [place], [place] [address]  Magnesium     Status: Abnormal   Collection Time: 01/22/18  3:57 AM  Result Value Ref Range   Magnesium 1.6 (L) 1.7 - 2.4 mg/dL    Comment: Performed at [facility] [address], [place], [place] [address]  Phosphorus     Status: None   Collection Time: 01/22/18  3:57 AM  Result Value Ref Range   Phosphorus 2.9 2.5 - 4.6 mg/dL    Comment: Performed at [facility] [address] [place], [place] [address]  CBC     Status: Abnormal   Collection Time: 01/22/18  4:56 AM  Result Value Ref Range   WBC 13.3 (H) 4.0 - 10.5 K/uL   RBC 3.11 (L) 4.22 - 5.81 MIL/uL   Hemoglobin 9.1 (L) 13.0 - 17.0 g/dL   HCT 26.1 (L) 39.0 - 52.0 %   MCV 83.9 78.0 - 100.0 fL    Comment: DELTA CHECK NOTED RESULTS VERIFIED VIA RECOLLECT    MCH 29.3 26.0 - 34.0 pg   MCHC 34.9 30.0 - 36.0 g/dL   RDW 14.5 11.5 - 15.5 %   Platelets 186 150 - 400 K/uL    Comment: DELTA CHECK NOTED REPEATED TO VERIFY RESULTS VERIFIED VIA RECOLLECT Performed at [facility], [address], [place], [place] [address]   Glucose, capillary     Status: None   Collection Time: 01/22/18  7:49 AM  Result Value Ref Range   Glucose-Capillary 75 65 - 99 mg/dL   Comment 1 Notify RN   Triglycerides     Status:  None   Collection Time: 01/22/18  9:00 AM  Result Value Ref Range   Triglycerides 131 <150 mg/dL    Comment: Performed at [facility], [place] [address], [place], [place] [address]  Lipase, blood     Status: None   Collection Time: 01/22/18  9:00 AM  Result Value Ref Range   Lipase 35 11 - 51 U/L    Comment: Performed at [facility] [address], [place], [place] [address]  Procalcitonin     Status: None   Collection Time: 01/22/18  9:00 AM  Result Value Ref Range   Procalcitonin 3.04 ng/mL    Comment:        Interpretation: PCT > 2 ng/mL: Systemic infection (sepsis) is likely, unless other causes are known. (NOTE)       Sepsis PCT Algorithm           Lower Respiratory Tract  Infection PCT Algorithm    ----------------------------     ----------------------------         PCT < 0.25 ng/mL                PCT < 0.10 ng/mL         Strongly encourage             Strongly discourage   discontinuation of antibiotics    initiation of antibiotics    ----------------------------     -----------------------------       PCT 0.25 - 0.50 ng/mL            PCT 0.10 - 0.25 ng/mL               OR       >80% decrease in PCT            Discourage initiation of                                            antibiotics      Encourage discontinuation           of antibiotics    ----------------------------     -----------------------------         PCT >= 0.50 ng/mL              PCT 0.26 - 0.50 ng/mL               AND       <80% decrease in PCT              Encourage initiation of                                             antibiotics       Encourage continuation           of antibiotics    ----------------------------     -----------------------------        PCT >= 0.50 ng/mL                  PCT > 0.50 ng/mL               AND         increase in PCT                  Strongly encourage                                      initiation of  antibiotics    Strongly encourage escalation           of antibiotics                                     -----------------------------                                           PCT <= 0.25 ng/mL  OR                                        > 80% decrease in PCT                                     Discontinue / Do not initiate                                             antibiotics Performed at [facility], [place] [address], [place], [place] [address]   Glucose, capillary     Status: Abnormal   Collection Time: 01/22/18 11:01 AM  Result Value Ref Range   Glucose-Capillary 44 (LL) 65 - 99 mg/dL   Comment 1 77SEE NOTE   Glucose, capillary     Status: Abnormal   Collection Time: 01/22/18 11:06 AM  Result Value Ref Range   Glucose-Capillary 44 (LL) 65 - 99 mg/dL  Glucose, capillary     Status: Abnormal   Collection Time: 01/22/18 11:33 AM  Result Value Ref Range   Glucose-Capillary 112 (H) 65 - 99 mg/dL  Glucose, capillary     Status: Abnormal   Collection Time: 01/22/18  5:36 PM  Result Value Ref Range   Glucose-Capillary 213 (H) 65 - 99 mg/dL  Glucose, capillary     Status: None   Collection Time: 01/22/18  8:03 PM  Result Value Ref Range   Glucose-Capillary 87 65 - 99 mg/dL   Comment 1 Capillary Specimen    Comment 2 Notify RN   Glucose, capillary     Status: None   Collection Time: 01/22/18  9:39 PM  Result Value Ref Range   Glucose-Capillary 67 65 - 99 mg/dL   Comment 1 Capillary Specimen   Glucose, capillary     Status: Abnormal   Collection Time: 01/22/18 10:06 PM  Result Value Ref Range   Glucose-Capillary 102 (H) 65 - 99 mg/dL   Comment 1 Capillary Specimen   Glucose, capillary     Status: Abnormal   Collection Time: 01/23/18 12:28 AM  Result Value Ref Range   Glucose-Capillary 155 (H) 65 - 99 mg/dL   Comment 1 Capillary Specimen    Comment 2 Notify RN   Renal function panel     Status: Abnormal   Collection  Time: 01/23/18  3:04 AM  Result Value Ref Range   Sodium 147 (H) 135 - 145 mmol/L   Potassium 3.4 (L) 3.5 - 5.1 mmol/L   Chloride 119 (H) 101 - 111 mmol/L   CO2 19 (L) 22 - 32 mmol/L   Glucose, Bld 141 (H) 65 - 99 mg/dL   BUN 19 6 - 20 mg/dL   Creatinine, Ser 1.34 (H) 0.61 - 1.24 mg/dL   Calcium 7.7 (L) 8.9 - 10.3 mg/dL   Phosphorus 2.0 (L) 2.5 - 4.6 mg/dL   Albumin 2.2 (L) 3.5 - 5.0 g/dL   GFR calc non Af Amer >60 >60 mL/min   GFR calc Af Amer >60 >60 mL/min    Comment: (NOTE) The eGFR has been calculated using the CKD EPI equation. This calculation has not been validated in all clinical situations. eGFR's persistently <60 mL/min signify possible  Chronic Kidney Disease.    Anion gap 9 5 - 15    Comment: Performed at [facility] [address] [place], [place] [address]  CBC     Status: Abnormal   Collection Time: 01/23/18  3:04 AM  Result Value Ref Range   WBC 9.1 4.0 - 10.5 K/uL   RBC 2.80 (L) 4.22 - 5.81 MIL/uL   Hemoglobin 8.1 (L) 13.0 - 17.0 g/dL   HCT 23.8 (L) 39.0 - 52.0 %   MCV 85.0 78.0 - 100.0 fL   MCH 28.9 26.0 - 34.0 pg   MCHC 34.0 30.0 - 36.0 g/dL   RDW 15.1 11.5 - 15.5 %   Platelets 136 (L) 150 - 400 K/uL    Comment: Performed at [facility], [place] [address] [place], [place] [address]  Hepatic function panel     Status: Abnormal   Collection Time: 01/23/18  3:04 AM  Result Value Ref Range   Total Protein 4.1 (L) 6.5 - 8.1 g/dL   Albumin 2.1 (L) 3.5 - 5.0 g/dL   AST 45 (H) 15 - 41 U/L   ALT 24 17 - 63 U/L   Alkaline Phosphatase 100 38 - 126 U/L   Total Bilirubin 0.3 0.3 - 1.2 mg/dL   Bilirubin, Direct 0.1 0.1 - 0.5 mg/dL   Indirect Bilirubin 0.2 (L) 0.3 - 0.9 mg/dL    Comment: Performed at [facility] [address] [place], [place] [address]  Glucose, capillary     Status: Abnormal   Collection Time: 01/23/18  3:09 AM  Result Value Ref Range   Glucose-Capillary 139 (H) 65 - 99 mg/dL   Comment 1 Capillary Specimen   Glucose,  capillary     Status: Abnormal   Collection Time: 01/23/18  7:16 AM  Result Value Ref Range   Glucose-Capillary 22 (LL) 65 - 99 mg/dL   Comment 1 Notify RN   Glucose, capillary     Status: Abnormal   Collection Time: 01/23/18  7:19 AM  Result Value Ref Range   Glucose-Capillary 37 (LL) 65 - 99 mg/dL   Comment 1 Capillary Specimen   Glucose, capillary     Status: Abnormal   Collection Time: 01/23/18  7:38 AM  Result Value Ref Range   Glucose-Capillary 59 (L) 65 - 99 mg/dL   Comment 1 Notify RN   Glucose, capillary     Status: None   Collection Time: 01/23/18  7:51 AM  Result Value Ref Range   Glucose-Capillary 65 65 - 99 mg/dL   Comment 1 Notify RN   Glucose, capillary     Status: None   Collection Time: 01/23/18  8:12 AM  Result Value Ref Range   Glucose-Capillary 75 65 - 99 mg/dL   Comment 1 Notify RN   Glucose, capillary     Status: Abnormal   Collection Time: 01/23/18 11:19 AM  Result Value Ref Range   Glucose-Capillary 130 (H) 65 - 99 mg/dL   Comment 1 Notify RN     Current Facility-Administered Medications  Medication Dose Route Frequency Provider Last Rate Last Dose  . chlorhexidine (PERIDEX) 0.12 % solution 15 mL  15 mL Mouth Rinse BID Simonne Maffucci B, MD   15 mL at 01/22/18 1000  . Chlorhexidine Gluconate Cloth 2 % PADS 6 each  6 each Topical Daily Juanito Doom, MD   6 each at 01/23/18 0400  . dextrose 5 % and 0.45 % NaCl with KCl 40  mEq/L infusion   Intravenous Continuous Juanito Doom, MD 75 mL/hr at 01/23/18 0100    . insulin aspart (novoLOG) injection 0-15 Units  0-15 Units Subcutaneous TID WC Juanito Doom, MD   5 Units at 01/22/18 1743  . insulin aspart (novoLOG) injection 0-5 Units  0-5 Units Subcutaneous QHS Simonne Maffucci B, MD      . insulin aspart (novoLOG) injection 4 Units  4 Units Subcutaneous TID WC Allie Bossier, MD      . insulin detemir (LEVEMIR) injection 15 Units  15 Units Subcutaneous BID Allie Bossier, MD      . MEDLINE  mouth rinse  15 mL Mouth Rinse q12n4p Simonne Maffucci B, MD      . pantoprazole sodium (PROTONIX) 40 mg/20 mL oral suspension 40 mg  40 mg Per Tube QHS Susa Raring, RPH      . potassium chloride (K-DUR,KLOR-CON) CR tablet 50 mEq  50 mEq Oral Once Allie Bossier, MD      . sodium chloride flush (NS) 0.9 % injection 10-40 mL  10-40 mL Intracatheter PRN Elnora Morrison, MD      . sodium chloride flush (NS) 0.9 % injection 10-40 mL  10-40 mL Intracatheter Q12H Juanito Doom, MD   10 mL at 01/22/18 2141  . sodium chloride flush (NS) 0.9 % injection 10-40 mL  10-40 mL Intracatheter PRN Juanito Doom, MD        Musculoskeletal: Strength & Muscle Tone: within normal limits Gait & Station: UTA since patient was lying in bed. Patient leans: N/A  Psychiatric Specialty Exam: Physical Exam  Nursing note and vitals reviewed. Constitutional: He is oriented to person, place, and time. He appears well-developed and well-nourished.  HENT:  Head: Normocephalic and atraumatic.  Neck: Normal range of motion.  Respiratory: Effort normal.  Neurological: He is alert and oriented to person, place, and time.  Psychiatric: He has a normal mood and affect. His speech is normal and behavior is normal. Judgment and thought content normal. Cognition and memory are normal.    Review of Systems  Respiratory: Positive for cough.   Cardiovascular: Positive for chest pain.  Psychiatric/Behavioral: Negative for depression, hallucinations, substance abuse and suicidal ideas. The patient is not nervous/anxious and does not have insomnia.   All other systems reviewed and are negative.   Blood pressure 121/88, pulse (!) 116, temperature 99.1 F (37.3 C), temperature source Oral, resp. rate 14, height 6' (1.829 m), SpO2 100 %.Body mass index is 19.61 kg/m.  General Appearance: Fairly Groomed, young, Caucasian male, wearing a hospital gown and lying in bed. NAD.   Eye Contact:  Good  Speech:  Clear and  Coherent and Normal Rate  Volume:  Normal  Mood:  "Not good"  Affect:  Constricted  Thought Process:  Goal Directed and Linear  Orientation:  Full (Time, Place, and Person)  Thought Content:  Logical  Suicidal Thoughts:  No  Homicidal Thoughts:  No  Memory:  Immediate;   Good Recent;   Good Remote;   Good  Judgement:  Fair  Insight:  Good and Fair  Psychomotor Activity:  Normal  Concentration:  Concentration: Good and Attention Span: Good  Recall:  Good  Fund of Knowledge:  Good  Language:  Good  Akathisia:  No  Handed:  Right  AIMS (if indicated):   N/A  Assets:  Communication Skills Desire for Improvement Financial Resources/Insurance Housing Social Support  ADL's:  Intact  Cognition:  WNL  Sleep:   Okay   Assessment:  TREYSHON BUCHANON is a 32 y.o. male who was admitted with altered mental status in the setting of recurrent DKA. Per EMS, there were crushed pills around patient on the floor. Patient denies SI or thoughts to harm self. He denies abusing his medications and reports taking them as prescribed. He is future oriented and appears to be invested in his care to get better. He reports that he is too sick to return home at this time and wants to continue treatment. He denies HI and AVH. He does not appear to be responding to internal stimuli and exhibits appropriate behavior. He provided the name of his roommate for collateral although his contact information was unable to be obtained. He appears to be forthcoming with information and does not warrant inpatient psychiatric hospitalization at this time.   Treatment Plan Summary: -Continue home Doxepin 10 mg qhs for insomnia.  -Hold Adderall during hospital admission. -Patient should follow up with his outpatient mental health provider for further medication management.  -Patient is psychiatrically cleared. Psychiatry will sign off patient at this time. Please consult psychiatry again as needed.   Disposition: No  evidence of imminent risk to self or others at present.   Patient does not meet criteria for psychiatric inpatient admission.  Faythe Dingwall, DO 01/23/2018 12:47 PM
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
## 2018-01-23 NOTE — Progress Notes (Signed)
eLink Physician-Brief Progress Note Patient Name: [name] DOB: 1986-09-10 MRN: [id number]   Date of Service  01/23/2018  HPI/Events of Note  K+ = 3.4 and Creatinine = 1.34.  eICU Interventions  Will replace K+.     Intervention Category Major Interventions: Electrolyte abnormality - evaluation and management  Lambros Cerro Eugene 01/23/2018, 6:42 AM

## 2018-01-23 NOTE — Progress Notes (Signed)
Hypoglycemic Event  CBG: 22  Treatment(1): 15 GM carbohydrate snack   Symptoms: Sweaty  Follow-up CBG: Time: 0738 CBG Result: 59 Treatment(2): 15 GM carbohydrate snack given Symptoms: Sweaty  Follow-up CBG Time: 0751 CBG Result: 65 Treatment(3): 15 GM carbohydrate snack  Symptoms: Sweaty  Follow-up CBG Time: 0812 CBG Result: 75          Possible Reasons for Event: Unknown  Comments/MD notified: Dr. Cyndi Bender
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
## 2018-01-23 NOTE — Progress Notes (Signed)
Inpatient Diabetes Program Recommendations  AACE/ADA: New Consensus Statement on Inpatient Glycemic Control (2015)  Target Ranges:  Prepandial:   less than 140 mg/dL      Peak postprandial:   less than 180 mg/dL (1-2 hours)      Critically ill patients:  140 - 180 mg/dL   Results for ABBOTT, [name] (MRN [id number]) as of 01/23/2018 13:01  Ref. Range 01/23/2018 00:28 01/23/2018 03:09 01/23/2018 07:16 01/23/2018 07:19 01/23/2018 07:38 01/23/2018 07:51 01/23/2018 08:12 01/23/2018 11:19  Glucose-Capillary Latest Ref Range: 65 - 99 mg/dL 248 (H)  Levemir 18 units @ 00:30 139 (H) 22 (LL) 37 (LL) 59 (L) 65 75 130 (H)   Results for DAMIRE, REMEDIOS (MRN [id number]) as of 01/23/2018 13:01  Ref. Range 01/22/2018 07:49 01/22/2018 11:01 01/22/2018 11:06 01/22/2018 11:33 01/22/2018 17:36 01/22/2018 20:03 01/22/2018 21:39 01/22/2018 22:06  Glucose-Capillary Latest Ref Range: 65 - 99 mg/dL 75 44 (LL) 44 (LL) 889 (H) 213 (H)  Novolog 11 units 87 67 102 (H)   Review of Glycemic Control  Diabetes history: DM1(makes no insulin; will require basal, correction, and meal coverage insulin) Outpatient Diabetes medications:Levemir 18 units BID, Novolog 6 units TID with meals, Novolog 0-15 units QID Current orders for Inpatient glycemic control: Levemir 15 units BID, Novolog 4 units TID with meals, Novolog 0-15 units TID with meals, Novolog 0-5 units QHS  Inpatient Diabetes Program Recommendations: Insulin - Basal: In reviewing chart, noted patient only received Levemir once over the past 24 hours (received Levemir 18 units at 00:30 on 01/23/18). Despite only getting Levemir once, patient has experienced hypoglycemia. Please consider decreasing Levemir to 12 units QHS. Correction (SSI): Patient has Type 1 DM and is very sensitive to insulin. Please consider decreasing Novolog correction to sensitive scale.  Thanks, Orlando Penner, RN, MSN, CDE Diabetes Coordinator Inpatient Diabetes Program [phone] (Team Pager from 8am to 5pm)

## 2018-01-24 ENCOUNTER — Encounter (HOSPITAL_COMMUNITY): Payer: Self-pay | Admitting: General Practice

## 2018-01-24 ENCOUNTER — Inpatient Hospital Stay (HOSPITAL_COMMUNITY): Payer: Medicare Other

## 2018-01-24 LAB — CBC
HEMATOCRIT: 24.1 % — AB (ref 39.0–52.0)
Hemoglobin: 8.1 g/dL — ABNORMAL LOW (ref 13.0–17.0)
MCH: 29.7 pg (ref 26.0–34.0)
MCHC: 33.6 g/dL (ref 30.0–36.0)
MCV: 88.3 fL (ref 78.0–100.0)
Platelets: 98 10*3/uL — ABNORMAL LOW (ref 150–400)
RBC: 2.73 MIL/uL — ABNORMAL LOW (ref 4.22–5.81)
RDW: 15.1 % (ref 11.5–15.5)
WBC: 6.2 10*3/uL (ref 4.0–10.5)

## 2018-01-24 LAB — GLUCOSE, CAPILLARY
GLUCOSE-CAPILLARY: 181 mg/dL — AB (ref 65–99)
GLUCOSE-CAPILLARY: 100 mg/dL — AB (ref 65–99)
GLUCOSE-CAPILLARY: 83 mg/dL (ref 65–99)
Glucose-Capillary: 22 mg/dL — CL (ref 65–99)
Glucose-Capillary: 257 mg/dL — ABNORMAL HIGH (ref 65–99)
Glucose-Capillary: 109 mg/dL — ABNORMAL HIGH (ref 65–99)
Glucose-Capillary: 98 mg/dL (ref 65–99)

## 2018-01-24 LAB — BASIC METABOLIC PANEL
Anion gap: 8 (ref 5–15)
BUN: 11 mg/dL (ref 6–20)
CHLORIDE: 112 mmol/L — AB (ref 101–111)
CO2: 25 mmol/L (ref 22–32)
CREATININE: 1 mg/dL (ref 0.61–1.24)
Calcium: 8 mg/dL — ABNORMAL LOW (ref 8.9–10.3)
GFR calc Af Amer: >60 mL/min (ref >60)
GFR calc non Af Amer: >60 mL/min (ref >60)
GLUCOSE: 116 mg/dL — AB (ref 65–99)
POTASSIUM: 3.5 mmol/L (ref 3.5–5.1)
Sodium: 145 mmol/L (ref 135–145)

## 2018-01-24 LAB — MAGNESIUM: Magnesium: 1.7 mg/dL (ref 1.7–2.4)

## 2018-01-24 LAB — TROPONIN I

## 2018-01-24 MED ORDER — TRAMADOL HCL 50 MG PO TABS
50.0000 mg | ORAL_TABLET | Freq: Four times a day (QID) | ORAL | Status: DC | PRN
  Administered 2018-01-24 – 2018-01-30 (×12): 50 mg via ORAL
  Filled 2018-01-24 (×15): qty 1

## 2018-01-24 MED ORDER — HALOPERIDOL LACTATE 5 MG/ML IJ SOLN
2.0000 mg | Freq: Four times a day (QID) | INTRAMUSCULAR | Status: DC | PRN
  Filled 2018-01-24: qty 1

## 2018-01-24 MED ORDER — IOPAMIDOL (ISOVUE-370) INJECTION 76%
INTRAVENOUS | Status: AC
  Administered 2018-01-24: 50 mL via INTRAVENOUS
  Filled 2018-01-24: qty 100

## 2018-01-24 MED ORDER — FOLIC ACID 1 MG PO TABS
1.0000 mg | ORAL_TABLET | Freq: Every day | ORAL | Status: DC
  Administered 2018-01-24 – 2018-02-02 (×8): 1 mg via ORAL
  Filled 2018-01-24 (×10): qty 1

## 2018-01-24 MED ORDER — PANTOPRAZOLE SODIUM 40 MG PO TBEC
40.0000 mg | DELAYED_RELEASE_TABLET | Freq: Every day | ORAL | Status: DC
  Administered 2018-01-25 – 2018-02-01 (×8): 40 mg via ORAL
  Filled 2018-01-24 (×8): qty 1

## 2018-01-24 MED ORDER — IOPAMIDOL (ISOVUE-370) INJECTION 76%
INTRAVENOUS | Status: AC
  Administered 2018-01-24: 100 mL via INTRAVENOUS
  Filled 2018-01-24: qty 50

## 2018-01-24 MED ORDER — GI COCKTAIL ~~LOC~~
30.0000 mL | Freq: Three times a day (TID) | ORAL | Status: DC | PRN
  Administered 2018-01-24 – 2018-01-26 (×4): 30 mL via ORAL
  Filled 2018-01-24 (×4): qty 30

## 2018-01-24 NOTE — Care Management Important Message (Signed)
Important Message  Patient Details  Name: [name] MRN: [id number] Date of Birth: 12/07/1986   Medicare Important Message Given:  Yes Patient said that he did not want to sign ask that I leave a unsign copy so I did   Jakevion Arney Stefan Church 01/24/2018, 12:16 PM

## 2018-01-24 NOTE — Progress Notes (Signed)
Received pt from , pt calm appears in no distress, rr unlabored skin w/d, denies hallucination or hearing voices at this time

## 2018-01-24 NOTE — Progress Notes (Signed)
PROGRESS NOTE  [name] MBE:[id number] DOB: 1986/05/31 DOA: 01/20/2018 PCP: Fleet Contras, MD  HPI/Recap of past 67 hours: 32 year old male with PMHx of Asthma, Anemia, DM Type 1, GERD, HTN, Schizoaffective disorder, Depression with multiple admissions secondary to DKA 2/2 medical non-compliance. Pt presented to ED on 2/3 with recurrent DKA. Upon arrival patient is hypotensive, hyperglycemic, and unresponsive.Per EMS patient found with crushed pills around him. Roommate called EMS, however, unsure how long patient has been unresponsive.VBG with pH of 6.6. Glucose 1175. Started on Insulin and Bicarb gtt, given one liter of NS. Intubated for airway protection and admitted to the ICU. Pt was extubated remained stable and was transferred to [facility] on 01/23/18.  Today, pt continues to c/o sharp chest pain, worse with talking and coughing. Pt denies any abdominal pain, SOB, fever/chills, N/V/D/C. Pt also noted to refuse blood draws.   Assessment/Plan: Principal Problem:   Altered mental status Active Problems:   DKA (diabetic ketoacidoses) ([name])   Endotracheally intubated   Increased anion gap metabolic acidosis   Pressure injury of skin  Pulmonary nodular lesions likely due to diffuse septic embolism Atypical chest pain Afebrile, with resolved leukocytosis LA 1.8, procalcitonin elevated at 3.04 BC X 2 done on 01/20/18, NGTD, repeat pending  CT chest angio: showed pulm nodular lesion likely due to diffuse septic embolism ID consulted: rec TEE, repeat BC, hold off AB for now. Will see in am Called cardiology for possible TEE in am, schedule full for tomorrow, likely on Monday. Will place NPO after midnight just incase a spot opens up Tramadol for pain Monitor closely, if deteriorates, please dont hesistate to call PCCM  Chest pain Atypical, likely due to above Trop X 2, flat trend 0.04, no acute ST changes D-dimer elevated, CT angio as above Monitor  closely  Acute respiratory failure with hypoxia  Resolved  Multifactorial metabolic encephalopathy, asthma  Diabetes Type 1 uncontrolled with complications/ DKA Continue Levemir 15 levemir BID, Novolog 4 units TID with meals, Moderate SSI  Hypovolemic shock Resolved Secondary to DKA/dehydration  Chronic Diastolic CHF 1/30 echocardiogram: LVEF 60-65%, -RIGHT ventricle moderately dilated Atrial septum:  increased thickness of the septum, consistent with lipomatous hypertrophy. Strict in and out, Daily weight  Essential HTN Stable  Acute kidney injury (baseline Cr<1)  Resolved Secondary to DKA/uncontrolled diabetes Continue hydration  Hypokalemia/Hypophosphatemia/Hypomagnesemia Replace prn, monitor daily  Normocytic anemia Mixed picture Folate low 3.3, iron 34, TIBC low, Vit B12 825 Start folic acid Occult blood pending   ??Attempted Suicide Patient found down with multiple crushed pills around him. Unfortunately upon admission UDS not obtained Psychiatric on board: continue doxepin, no further recs  Schizoaffective disorder Continue Trazodone 150 mg QHS, Doxepin 10 mg QHS Haldol IV 2 mg QID prn     Code Status: Full  Family Communication: None at bedside  Disposition Plan: TBD   Consultants:  PCCM  Psychiatry  Procedures:  Intubation  Antimicrobials:  None  DVT prophylaxis:  [place] Heparin   Objective: Vitals:   01/23/18 2014 01/23/18 2123 01/24/18 0454 01/24/18 1358  BP:  126/77 111/63 (!) 138/96  Pulse:  (!) 116 (!) 115 (!) 113  Resp:  20 20 20   Temp: 99 F (37.2 C) 99 F (37.2 C) 98.5 F (36.9 C) 98.3 F (36.8 C)  TempSrc: Oral Oral Oral Oral  SpO2:  100% 99% 100%  Height:        Intake/Output Summary (Last 24 hours) at 01/24/2018 1620 Last data filed at  01/24/2018 1300 Gross per 24 hour  Intake 802 ml  Output -  Net 802 ml   There were no vitals filed for this visit.  Exam:   General: Alert, awake, ill-appearing,  cachetic  Cardiovascular: S1-S2 present, no added hrt sound  Respiratory: Chest clear bilaterally   Abdomen: Soft, non-tender, non-distended, BS present   Musculoskeletal: No bilateral lower extremity edema  Skin: Normal  Psychiatry: Fair mood   Data Reviewed: CBC: Recent Labs  Lab 01/20/18 1812 01/22/18 0456 01/23/18 0304 01/24/18 1241  WBC 42.5* 13.3* 9.1 6.2  HGB 11.2* 9.1* 8.1* 8.1*  HCT 37.3* 26.1* 23.8* 24.1*  MCV 101.4* 83.9 85.0 88.3  PLT 477* 186 136* 98*   Basic Metabolic Panel: Recent Labs  Lab 01/20/18 2324  01/21/18 0448 01/21/18 1152 01/21/18 1655 01/22/18 0357 01/23/18 0304 01/23/18 1505 01/24/18 1241  NA 140   < > 142 145 150* 149* 147*  --  145  K 4.8   < > 3.1* 2.2* 3.2* 4.1 3.4* 4.2 3.5  CL 108   < > 112* 114* 120* 121* 119*  --  112*  CO2 <7*   < > 7* 13* 18* 17* 19*  --  25  GLUCOSE 883*   < > 556* 313* 102* 158* 141*  --  116*  BUN 45*   < > 43* 39* 37* 32* 19  --  11  CREATININE 3.21*   < > 2.99* 2.54* 2.28* 1.93* 1.34*  --  1.00  CALCIUM 7.3*   < > 7.4* 7.5* 7.5* 7.3* 7.7*  --  8.0*  MG 2.7*  --  2.0  --   --  1.6*  --  1.5* 1.7  PHOS  --   --  <1.0*  --   --  2.9 2.0*  --   --    < > = values in this interval not displayed.   GFR: Estimated Creatinine Clearance: 97.5 mL/min (by C-G formula based on SCr of 1 mg/dL). Liver Function Tests: Recent Labs  Lab 01/23/18 0304  AST 45*  ALT 24  ALKPHOS 100  BILITOT 0.3  PROT 4.1*  ALBUMIN 2.1*  2.2*   Recent Labs  Lab 01/22/18 0900  LIPASE 35   No results for input(s): AMMONIA in the last 168 hours. Coagulation Profile: No results for input(s): INR, PROTIME in the last 168 hours. Cardiac Enzymes: Recent Labs  Lab 01/23/18 1505 01/23/18 1921  TROPONINI 0.04* 0.04*   BNP (last 3 results) No results for input(s): PROBNP in the last 8760 hours. HbA1C: No results for input(s): HGBA1C in the last 72 hours. CBG: Recent Labs  Lab 01/23/18 1606 01/23/18 2121 01/24/18 0338  01/24/18 0759 01/24/18 1142  GLUCAP 72 257* 181* 109* 98   Lipid Profile: Recent Labs    01/22/18 0900 01/23/18 1505  CHOL  --  115  HDL  --  50  LDLCALC  --  51  TRIG 131 70  CHOLHDL  --  2.3   Thyroid Function Tests: No results for input(s): TSH, T4TOTAL, FREET4, T3FREE, THYROIDAB in the last 72 hours. Anemia Panel: Recent Labs    01/23/18 1505  VITAMINB12 825  FOLATE 3.3*  FERRITIN 236  TIBC 174*  IRON 34*  RETICCTPCT 0.5   Urine analysis:    Component Value Date/Time   COLORURINE STRAW (A) 12/10/2017 0133   APPEARANCEUR CLEAR 12/10/2017 0133   LABSPEC 1.011 12/10/2017 0133   PHURINE 5.0 12/10/2017 0133   GLUCOSEU >=500 (A) 12/10/2017 0133  HGBUR SMALL (A) 12/10/2017 0133   BILIRUBINUR NEGATIVE 12/10/2017 0133   BILIRUBINUR negative 03/29/2015 1114   KETONESUR 80 (A) 12/10/2017 0133   PROTEINUR 30 (A) 12/10/2017 0133   UROBILINOGEN 0.2 05/17/2015 1810   NITRITE NEGATIVE 12/10/2017 0133   LEUKOCYTESUR NEGATIVE 12/10/2017 0133   Sepsis Labs: @LABRCNTIP (procalcitonin:4,lacticidven:4)  ) Recent Results (from the past 240 hour(s))  Blood culture (routine x 2)     Status: None   Collection Time: 01/14/18  6:44 PM  Result Value Ref Range Status   Specimen Description   Final    BLOOD RIGHT ANTECUBITAL Performed at [facility], [address] [place], [place]    Special Requests   Final    BOTTLES DRAWN AEROBIC AND ANAEROBIC Blood Culture adequate volume Performed at [facility], [address], [place], [place]    Culture   Final    NO GROWTH 5 DAYS Performed at [facility], [address], [place], [place]    Report Status 01/19/2018 FINAL  Final  Blood culture (routine x 2)     Status: None   Collection Time: 01/14/18 11:38 PM  Result Value Ref Range Status   Specimen Description   Final    BLOOD Performed at [facility], [address],  [place], [place]    Special Requests IN PEDIATRIC BOTTLE  Final   Culture   Final    NO GROWTH 5 DAYS Performed at [facility], [address], [place], [place]    Report Status 01/20/2018 FINAL  Final  MRSA PCR Screening     Status: None   Collection Time: 01/15/18  8:21 AM  Result Value Ref Range Status   MRSA by PCR NEGATIVE NEGATIVE Final    Comment:        The GeneXpert MRSA Assay (FDA approved for NASAL specimens only), is one component of a comprehensive MRSA colonization surveillance program. It is not intended to diagnose MRSA infection nor to guide or monitor treatment for MRSA infections.   Blood culture (routine x 2)     Status: None (Preliminary result)   Collection Time: 01/20/18  7:25 PM  Result Value Ref Range Status   Specimen Description BLOOD LEFT WRIST  Final   Special Requests   Final    BOTTLES DRAWN AEROBIC ONLY Blood Culture adequate volume   Culture   Final    NO GROWTH 4 DAYS Performed at [facility], [address] [place], [place]    Report Status PENDING  Incomplete  Blood culture (routine x 2)     Status: None (Preliminary result)   Collection Time: 01/20/18  7:30 PM  Result Value Ref Range Status   Specimen Description BLOOD RIGHT WRIST  Final   Special Requests   Final    BOTTLES DRAWN AEROBIC ONLY Blood Culture adequate volume   Culture   Final    NO GROWTH 4 DAYS Performed at [facility], [address], [place], [place]    Report Status PENDING  Incomplete      Studies: Ct Angio Chest Pe W Or Wo Contrast  Result Date: 01/24/2018 CLINICAL DATA:  Chest pain and fever.  Shortness of breath EXAM: CT ANGIOGRAPHY CHEST WITH CONTRAST TECHNIQUE: Multidetector CT imaging of the chest was performed using the standard protocol during bolus administration of intravenous contrast. Multiplanar CT image reconstructions and MIPs were obtained  to evaluate the vascular anatomy. CONTRAST:  100 mL  Isovue 370 nonionic COMPARISON:  Chest radiograph January 23, 2018 FINDINGS: Cardiovascular: There is no demonstrable pulmonary embolus. There is no thoracic aortic aneurysm or dissection. The visualized great vessels appear unremarkable. Note that the right and left common carotid arteries arise as a common trunk, an anatomic variant. There is no appreciable pericardial effusion or pericardial thickening. Mediastinum/Nodes: Visualized thyroid appears normal. Mild residual thymic tissue may be normal for age. There is no appreciable thoracic adenopathy. There is fluid in the distal esophagus which may indicate a degree of spontaneous reflux. Lungs/Pleura: There are nodular opacities throughout the lungs bilaterally, most ranging in size from between 4 mm and 1.2 cm. Several of these nodular opacities of show evidence of cavitation. Many of the larger nodular lesions are rather ill-defined. These lesions most likely represents septic emboli. There is bibasilar atelectasis. There is mild consolidation in the left base posteriorly. There are pleural effusions bilaterally, free-flowing. Upper Abdomen: Visualized upper abdominal structures appear unremarkable. Musculoskeletal: There are no blastic or lytic bone lesions. Review of the MIP images confirms the above findings. IMPRESSION: 1. Small pulmonary nodular lesions throughout the lungs involving all lobes in segments ranging in size from 4 mm to 1.2 cm. Several of the larger nodular lesion shows subtle cavitation and have somewhat ill-defined borders. The appearance is felt to be most indicative of diffuse septic embolism. 2. Free-flowing pleural effusions bilaterally, moderate. Mild consolidation posterior left base. 3.  No demonstrable pulmonary embolus. 4.  No appreciable thoracic adenopathy. 5. Fluid in distal esophagus may represent spontaneous gastroesophageal reflux. Electronically Signed   By: Bretta Bang III M.D.   On: 01/24/2018 10:29    Scheduled  Meds: . chlorhexidine  15 mL Mouth Rinse BID  . Chlorhexidine Gluconate Cloth  6 each Topical Daily  . doxepin  10 mg Oral QHS  . haloperidol lactate  2 mg Intravenous Q6H  . heparin injection (subcutaneous)  5,000 Units Subcutaneous Q8H  . insulin aspart  0-15 Units Subcutaneous TID WC  . insulin aspart  0-5 Units Subcutaneous QHS  . insulin aspart  4 Units Subcutaneous TID WC  . insulin detemir  15 Units Subcutaneous BID  . mouth rinse  15 mL Mouth Rinse q12n4p  . pantoprazole sodium  40 mg Per Tube QHS  . potassium chloride  50 mEq Oral Daily  . sodium chloride flush  10-40 mL Intracatheter Q12H  . traZODone  150 mg Oral QHS    Continuous Infusions: . dextrose 5 % and 0.45 % NaCl with KCl 40 mEq/L Stopped (01/23/18 1408)     LOS: 4 days     Briant Cedar, MD [facility]   If 7PM-7AM, please contact night-coverage [email or web address] Password [email or web address] 01/24/2018, 4:20 PM

## 2018-01-24 NOTE — Progress Notes (Signed)
Patient reused his telemetry  And IVF for me. I was able to convenience him to take his potassium only with OJ and he will put his telemetry on after dinner. Notified CCMD and MD.

## 2018-01-24 NOTE — Progress Notes (Signed)
MD notified of pt's CT chest results as well as pt c/o 8/10 mid chest pain described as sharp when he coughs. Awaiting new orders. Will continue to monitor the pt.Sanda Linger, RN

## 2018-01-24 NOTE — Progress Notes (Signed)
Pt c/o substernal sharp cp pt states pain increased with "talking and coughing". Need for troponin lab draw explained, pt refused lab draw for all am blood work commenting "I want to rest"

## 2018-01-24 NOTE — Care Management Important Message (Signed)
Important Message  Patient Details  Name: [name] MRN: [id number] Date of Birth: 08/09/1986   Medicare Important Message Given:  Yes    Dorena Bodo 01/24/2018, 12:14 PM

## 2018-01-25 ENCOUNTER — Inpatient Hospital Stay (HOSPITAL_COMMUNITY): Payer: Medicare Other

## 2018-01-25 DIAGNOSIS — E10621 Type 1 diabetes mellitus with foot ulcer: Secondary | ICD-10-CM

## 2018-01-25 DIAGNOSIS — R1310 Dysphagia, unspecified: Secondary | ICD-10-CM

## 2018-01-25 DIAGNOSIS — F17210 Nicotine dependence, cigarettes, uncomplicated: Secondary | ICD-10-CM

## 2018-01-25 DIAGNOSIS — F259 Schizoaffective disorder, unspecified: Secondary | ICD-10-CM

## 2018-01-25 DIAGNOSIS — Z882 Allergy status to sulfonamides status: Secondary | ICD-10-CM

## 2018-01-25 DIAGNOSIS — Z833 Family history of diabetes mellitus: Secondary | ICD-10-CM

## 2018-01-25 DIAGNOSIS — L97309 Non-pressure chronic ulcer of unspecified ankle with unspecified severity: Secondary | ICD-10-CM

## 2018-01-25 DIAGNOSIS — Z885 Allergy status to narcotic agent status: Secondary | ICD-10-CM

## 2018-01-25 DIAGNOSIS — J984 Other disorders of lung: Secondary | ICD-10-CM

## 2018-01-25 DIAGNOSIS — E10622 Type 1 diabetes mellitus with other skin ulcer: Secondary | ICD-10-CM

## 2018-01-25 DIAGNOSIS — E1010 Type 1 diabetes mellitus with ketoacidosis without coma: Secondary | ICD-10-CM

## 2018-01-25 DIAGNOSIS — R918 Other nonspecific abnormal finding of lung field: Secondary | ICD-10-CM

## 2018-01-25 DIAGNOSIS — L97429 Non-pressure chronic ulcer of left heel and midfoot with unspecified severity: Secondary | ICD-10-CM

## 2018-01-25 DIAGNOSIS — K0889 Other specified disorders of teeth and supporting structures: Secondary | ICD-10-CM

## 2018-01-25 DIAGNOSIS — J45909 Unspecified asthma, uncomplicated: Secondary | ICD-10-CM

## 2018-01-25 DIAGNOSIS — L89151 Pressure ulcer of sacral region, stage 1: Secondary | ICD-10-CM

## 2018-01-25 DIAGNOSIS — K047 Periapical abscess without sinus: Secondary | ICD-10-CM

## 2018-01-25 LAB — CBC
HCT: 22.6 % — ABNORMAL LOW (ref 39.0–52.0)
HEMOGLOBIN: 7.5 g/dL — AB (ref 13.0–17.0)
MCH: 29 pg (ref 26.0–34.0)
MCHC: 33.2 g/dL (ref 30.0–36.0)
MCV: 87.3 fL (ref 78.0–100.0)
Platelets: 122 10*3/uL — ABNORMAL LOW (ref 150–400)
RBC: 2.59 MIL/uL — AB (ref 4.22–5.81)
RDW: 14.1 % (ref 11.5–15.5)
WBC: 5.2 10*3/uL (ref 4.0–10.5)

## 2018-01-25 LAB — BASIC METABOLIC PANEL
Anion gap: 12 (ref 5–15)
BUN: 15 mg/dL (ref 6–20)
CHLORIDE: 103 mmol/L (ref 101–111)
CO2: 23 mmol/L (ref 22–32)
CREATININE: 1.02 mg/dL (ref 0.61–1.24)
Calcium: 7.9 mg/dL — ABNORMAL LOW (ref 8.9–10.3)
GFR calc non Af Amer: >60 mL/min (ref >60)
Glucose, Bld: 154 mg/dL — ABNORMAL HIGH (ref 65–99)
Potassium: 4.4 mmol/L (ref 3.5–5.1)
Sodium: 138 mmol/L (ref 135–145)

## 2018-01-25 LAB — CULTURE, BLOOD (ROUTINE X 2)
CULTURE: NO GROWTH
Culture: NO GROWTH
SPECIAL REQUESTS: ADEQUATE
SPECIAL REQUESTS: ADEQUATE

## 2018-01-25 LAB — MAGNESIUM: Magnesium: 1.7 mg/dL (ref 1.7–2.4)

## 2018-01-25 LAB — URINALYSIS, ROUTINE W REFLEX MICROSCOPIC
Bilirubin Urine: NEGATIVE
GLUCOSE, UA: 150 mg/dL — AB
HGB URINE DIPSTICK: NEGATIVE
Ketones, ur: NEGATIVE mg/dL
Leukocytes, UA: NEGATIVE
NITRITE: NEGATIVE
PH: 7 (ref 5.0–8.0)
Protein, ur: NEGATIVE mg/dL
SPECIFIC GRAVITY, URINE: 1.01 (ref 1.005–1.030)

## 2018-01-25 LAB — GLUCOSE, CAPILLARY
GLUCOSE-CAPILLARY: 161 mg/dL — AB (ref 65–99)
GLUCOSE-CAPILLARY: 429 mg/dL — AB (ref 65–99)
GLUCOSE-CAPILLARY: 75 mg/dL (ref 65–99)
Glucose-Capillary: 483 mg/dL — ABNORMAL HIGH (ref 65–99)
Glucose-Capillary: 68 mg/dL (ref 65–99)

## 2018-01-25 LAB — LACTIC ACID, PLASMA
Lactic Acid, Venous: 2.9 mmol/L (ref 0.5–1.9)
Lactic Acid, Venous: 2.5 mmol/L (ref 0.5–1.9)

## 2018-01-25 LAB — RAPID URINE DRUG SCREEN, HOSP PERFORMED
Amphetamines: NOT DETECTED
BARBITURATES: NOT DETECTED
Benzodiazepines: NOT DETECTED
COCAINE: NOT DETECTED
Opiates: NOT DETECTED
TETRAHYDROCANNABINOL: NOT DETECTED

## 2018-01-25 MED ORDER — TRAMADOL HCL 50 MG PO TABS
50.0000 mg | ORAL_TABLET | Freq: Once | ORAL | Status: AC
  Administered 2018-01-25: 50 mg via ORAL
  Filled 2018-01-25: qty 1

## 2018-01-25 MED ORDER — KETOROLAC TROMETHAMINE 30 MG/ML IJ SOLN
30.0000 mg | Freq: Once | INTRAMUSCULAR | Status: AC
  Administered 2018-01-25: 30 mg via INTRAVENOUS
  Filled 2018-01-25: qty 1

## 2018-01-25 MED ORDER — SODIUM CHLORIDE 0.9 % IV BOLUS (SEPSIS)
500.0000 mL | Freq: Once | INTRAVENOUS | Status: AC
Start: 2018-01-25 — End: 2018-01-25
  Administered 2018-01-25: 500 mL via INTRAVENOUS

## 2018-01-25 MED ORDER — SODIUM CHLORIDE 0.9 % IV SOLN
3.0000 g | Freq: Four times a day (QID) | INTRAVENOUS | Status: DC
  Administered 2018-01-25 – 2018-01-28 (×10): 3 g via INTRAVENOUS
  Filled 2018-01-25 (×13): qty 3

## 2018-01-25 MED ORDER — SODIUM CHLORIDE 0.9 % IV BOLUS (SEPSIS)
1000.0000 mL | Freq: Once | INTRAVENOUS | Status: AC
  Administered 2018-01-25: 1000 mL via INTRAVENOUS

## 2018-01-25 MED ORDER — SODIUM CHLORIDE 0.9 % IV SOLN
INTRAVENOUS | Status: DC
  Administered 2018-01-25: 22:00:00 via INTRAVENOUS
  Administered 2018-01-26: 1000 mL via INTRAVENOUS

## 2018-01-25 NOTE — Progress Notes (Signed)
PROGRESS NOTE  [name] XBL:[id number] DOB: 1986-07-19 DOA: 01/20/2018 PCP: Fleet Contras, MD  HPI/Recap of past 28 hours: 32 year old male with PMHx of Asthma, Anemia, DM Type 1, GERD, HTN, Schizoaffective disorder, Depression with multiple admissions secondary to DKA 2/2 medical non-compliance. Pt presented to ED on 2/3 with recurrent DKA. Upon arrival patient is hypotensive, hyperglycemic, and unresponsive.Per EMS patient found with crushed pills around him. Roommate called EMS, however, unsure how long patient has been unresponsive.VBG with pH of 6.6. Glucose 1175. Started on Insulin and Bicarb gtt, given one liter of NS. Intubated for airway protection and admitted to the ICU. Pt was extubated remained stable and was transferred to [facility] on 01/23/18.  Today, patient reported feeling okay, complained about food getting stuck in his throat, unable to keep things down.  Otherwise denies any chest pain, abdominal pain, shortness of breath, fever/chills.  Patient still with hoarse voice status post intubation.   Assessment/Plan: Principal Problem:   Altered mental status Active Problems:   DKA (diabetic ketoacidoses) ([name])   Endotracheally intubated   Increased anion gap metabolic acidosis   Pressure injury of skin  Pulmonary nodular lesions likely due to diffuse septic embolism ?? Source from multiple dental caries Afebrile, with resolved leukocytosis LA 1.8, procalcitonin elevated at 3.04 BC X 2 done on 01/20/18, NGTD, repeat pending  CT chest angio: showed pulm nodular lesion likely due to diffuse septic embolism ID consulted: rec TEE, will be done on 2/11, NPO on 2/10, orthopantogram pending, plan to consult Dr Kristin Bruins. Start Unasyn. Check HIV Tramadol for pain Monitor closely, if deteriorates, please dont hesistate to call PCCM  Non-cardiac chest pain Atypical Trop X 2, flat trend 0.04, no acute ST changes D-dimer elevated, CT angio as  above Monitor closely  Acute respiratory failure with hypoxia  Resolved  Multifactorial metabolic encephalopathy, asthma  Diabetes Type 1 uncontrolled with complications/ DKA Fluctuating BS, brittle DM Continue Levemir 15 levemir BID, Novolog 4 units TID with meals, Moderate SSI  Dysphagia/GERD Mod barium swallow done, rec regular food with thin liquids  If persistent, ENT consult due to hoarse voice after extubation  Hypovolemic shock Resolved Secondary to DKA/dehydration  Chronic Diastolic CHF 1/30 echocardiogram: LVEF 60-65%, -RIGHT ventricle moderately dilated Atrial septum:  increased thickness of the septum, consistent with lipomatous hypertrophy. Strict in and out, Daily weight  Essential HTN Stable  Acute kidney injury (baseline Cr<1)  Resolved Secondary to DKA/uncontrolled diabetes Continue hydration  Hypokalemia/Hypophosphatemia/Hypomagnesemia Replace prn, monitor daily  Normocytic anemia Mixed picture Folate low 3.3, iron 34, TIBC low, Vit B12 825 Start folic acid Occult blood pending   ??Attempted Suicide Patient found down with multiple crushed pills around him. Unfortunately upon admission UDS not obtained Psychiatric on board: continue doxepin, no further recs  Schizoaffective disorder Continue Trazodone 150 mg QHS, Doxepin 10 mg QHS Haldol IV 2 mg QID prn     Code Status: Full  Family Communication: None at bedside  Disposition Plan: TBD   Consultants:  PCCM  Psychiatry  ID  Procedures:  Intubation  Antimicrobials:  IV Unasyn  DVT prophylaxis:  [place] Heparin   Objective: Vitals:   01/24/18 1358 01/24/18 2055 01/25/18 0528 01/25/18 1245  BP: (!) 138/96 109/63 108/67 122/75  Pulse: (!) 113 (!) 108 (!) 102 (!) 101  Resp: 20 18 16    Temp: 98.3 F (36.8 C) 98.3 F (36.8 C) 98.2 F (36.8 C) 98.1 F (36.7 C)  TempSrc: Oral Oral Oral Oral  SpO2:  100% 98% 98% 100%  Height:        Intake/Output Summary (Last 24  hours) at 01/25/2018 1853 Last data filed at 01/25/2018 1521 Gross per 24 hour  Intake 320 ml  Output 1200 ml  Net -880 ml   There were no vitals filed for this visit.  Exam:   General: Alert, awake, ill-appearing, cachetic  Cardiovascular: S1-S2 present, no added hrt sound  Respiratory: Chest clear bilaterally   Abdomen: Soft, non-tender, non-distended, BS present   Musculoskeletal: No bilateral lower extremity edema  Skin: Normal  Psychiatry: Fair mood   Data Reviewed: CBC: Recent Labs  Lab 01/20/18 1812 01/22/18 0456 01/23/18 0304 01/24/18 1241 01/25/18 1600  WBC 42.5* 13.3* 9.1 6.2 5.2  HGB 11.2* 9.1* 8.1* 8.1* 7.5*  HCT 37.3* 26.1* 23.8* 24.1* 22.6*  MCV 101.4* 83.9 85.0 88.3 87.3  PLT 477* 186 136* 98* 122*   Basic Metabolic Panel: Recent Labs  Lab 01/21/18 0448  01/21/18 1655 01/22/18 0357 01/23/18 0304 01/23/18 1505 01/24/18 1241 01/25/18 1600  NA 142   < > 150* 149* 147*  --  145 138  K 3.1*   < > 3.2* 4.1 3.4* 4.2 3.5 4.4  CL 112*   < > 120* 121* 119*  --  112* 103  CO2 7*   < > 18* 17* 19*  --  25 23  GLUCOSE 556*   < > 102* 158* 141*  --  116* 154*  BUN 43*   < > 37* 32* 19  --  11 15  CREATININE 2.99*   < > 2.28* 1.93* 1.34*  --  1.00 1.02  CALCIUM 7.4*   < > 7.5* 7.3* 7.7*  --  8.0* 7.9*  MG 2.0  --   --  1.6*  --  1.5* 1.7 1.7  PHOS <1.0*  --   --  2.9 2.0*  --   --   --    < > = values in this interval not displayed.   GFR: Estimated Creatinine Clearance: 95.6 mL/min (by C-G formula based on SCr of 1.02 mg/dL). Liver Function Tests: Recent Labs  Lab 01/23/18 0304  AST 45*  ALT 24  ALKPHOS 100  BILITOT 0.3  PROT 4.1*  ALBUMIN 2.1*  2.2*   Recent Labs  Lab 01/22/18 0900  LIPASE 35   No results for input(s): AMMONIA in the last 168 hours. Coagulation Profile: No results for input(s): INR, PROTIME in the last 168 hours. Cardiac Enzymes: Recent Labs  Lab 01/23/18 1505 01/23/18 1921 01/24/18 2143  TROPONINI 0.04* 0.04*  <0.03   BNP (last 3 results) No results for input(s): PROBNP in the last 8760 hours. HbA1C: No results for input(s): HGBA1C in the last 72 hours. CBG: Recent Labs  Lab 01/24/18 2058 01/25/18 0735 01/25/18 1145 01/25/18 1325 01/25/18 1708  GLUCAP 83 161* 483* 429* 68   Lipid Profile: Recent Labs    01/23/18 1505  CHOL 115  HDL 50  LDLCALC 51  TRIG 70  CHOLHDL 2.3   Thyroid Function Tests: No results for input(s): TSH, T4TOTAL, FREET4, T3FREE, THYROIDAB in the last 72 hours. Anemia Panel: Recent Labs    01/23/18 1505  VITAMINB12 825  FOLATE 3.3*  FERRITIN 236  TIBC 174*  IRON 34*  RETICCTPCT 0.5   Urine analysis:    Component Value Date/Time   COLORURINE STRAW (A) 01/25/2018 0609   APPEARANCEUR CLEAR 01/25/2018 0609   LABSPEC 1.010 01/25/2018 0609   PHURINE 7.0 01/25/2018 0272  GLUCOSEU 150 (A) 01/25/2018 0609   HGBUR NEGATIVE 01/25/2018 0609   BILIRUBINUR NEGATIVE 01/25/2018 0609   BILIRUBINUR negative 03/29/2015 1114   KETONESUR NEGATIVE 01/25/2018 0609   PROTEINUR NEGATIVE 01/25/2018 0609   UROBILINOGEN 0.2 05/17/2015 1810   NITRITE NEGATIVE 01/25/2018 0609   LEUKOCYTESUR NEGATIVE 01/25/2018 0609   Sepsis Labs: @LABRCNTIP (procalcitonin:4,lacticidven:4)  ) Recent Results (from the past 240 hour(s))  Blood culture (routine x 2)     Status: None   Collection Time: 01/20/18  7:25 PM  Result Value Ref Range Status   Specimen Description BLOOD LEFT WRIST  Final   Special Requests   Final    BOTTLES DRAWN AEROBIC ONLY Blood Culture adequate volume   Culture   Final    NO GROWTH 5 DAYS Performed at [facility], [address] [place], [place] [address]    Report Status 01/25/2018 FINAL  Final  Blood culture (routine x 2)     Status: None   Collection Time: 01/20/18  7:30 PM  Result Value Ref Range Status   Specimen Description BLOOD RIGHT WRIST  Final   Special Requests   Final    BOTTLES DRAWN AEROBIC ONLY Blood Culture adequate volume    Culture   Final    NO GROWTH 5 DAYS Performed at [facility], [address] [place], [place] [address]    Report Status 01/25/2018 FINAL  Final  Culture, blood (routine x 2)     Status: None (Preliminary result)   Collection Time: 01/24/18 10:02 PM  Result Value Ref Range Status   Specimen Description RIGHT ANTECUBITAL  Final   Special Requests AEROBIC BOTTLE ONLY Blood Culture adequate volume  Final   Culture PENDING  Incomplete   Report Status PENDING  Incomplete  Culture, blood (routine x 2)     Status: None (Preliminary result)   Collection Time: 01/24/18 10:12 PM  Result Value Ref Range Status   Specimen Description BLOOD RIGHT HAND  Final   Special Requests AEROBIC BOTTLE ONLY Blood Culture adequate volume  Final   Culture PENDING  Incomplete   Report Status PENDING  Incomplete      Studies: Dg Orthopantogram  Result Date: 01/25/2018 CLINICAL DATA:  Evaluate for possible dental abscess EXAM: ORTHOPANTOGRAM/PANORAMIC COMPARISON:  None. FINDINGS: Mandible is intact. No acute fracture is seen. Diffuse dental caries are identified. Significant motion artifact is identified which somewhat limits the exam. Suspicious areas of lucency are noted surrounding the remaining left incisors in the maxilla and mandible. IMPRESSION: Diffuse dental caries. Motion artifact limits the exam significantly. Suspicious areas of lucency are noted as described above. Further evaluation by CT the maxillofacial bones is recommended. Electronically Signed   By: Alcide Clever M.D.   On: 01/25/2018 14:18   Dg Foot Complete Left  Result Date: 01/25/2018 CLINICAL DATA:  Foot ulcer EXAM: LEFT FOOT - COMPLETE 3+ VIEW COMPARISON:  08/17/2017 FINDINGS: There is no evidence of fracture or dislocation. There is no evidence of arthropathy or other focal bone abnormality. Soft tissues are unremarkable. IMPRESSION: No acute abnormality is noted. Electronically Signed   By: Alcide Clever M.D.   On: 01/25/2018 14:23    Dg Swallowing Func-speech Pathology  Result Date: 01/25/2018 Objective Swallowing Evaluation: Type of Study: MBS-Modified Barium Swallow Study  Patient Details Name: [name] MRN: [id number] Date of Birth: Aug 06, 1986 Today's Date: 01/25/2018 Time: SLP Start Time (ACUTE ONLY): 1415 -SLP Stop Time (ACUTE ONLY): 1430 SLP Time Calculation (min) (ACUTE ONLY): 15  min Past Medical History: Past Medical History: Diagnosis Date . Anemia  . Anxiety  . Arthritis   "both hips; both shoulders" (11/05/2017) . Chicken pox  . Childhood asthma  . Chronic pain  . Depression  . DKA (diabetic ketoacidoses) ([name]) 11/18/2014 . DKA (diabetic ketoacidoses) ([name]) 07/05/2017 . GERD (gastroesophageal reflux disease)  . Hypertension  . Migraine   "a few/year" (06/06/2017) . Noncompliance with medication regimen  . Pneumonia   "several times" (06/06/2017) . Polysubstance abuse ([name])  . Schizo affective schizophrenia ([name])  . Scoliosis  . Type I diabetes mellitus ([name])  Past Surgical History: Past Surgical History: Procedure Laterality Date . CARDIAC CATHETERIZATION  11/2016 . INCISION AND DRAINAGE ABSCESS Left 11/2011  "MRSA removed off my thumb" HPI: 32 year old male admitted 01/20/18 with DKA and AMS. Pt was intubated 2/3 - 5/19. PMH significant for GERD, PNA, polysubstance abuse, schizoaffective schizophrenia, DM type 1, medical noncompliance  Subjective: Pt seen in radiology for MBS Assessment / Plan / Recommendation CHL IP CLINICAL IMPRESSIONS 01/25/2018 Clinical Impression Pt presents with normal oral swallow, mild pharyngeal dysphagia. Pharyngeally, Swallow reflex is delayed on thin liquids, with trigger noted at the level of the pyriform sinuses.  Other consistencies triggered in a timely fashion. No post-swallow residue was seen on any consistency. No penetration or aspiration of puree or solid consistencies was noted. Frank aspiration of thin liquids was seen during large consecutive bouses, but no penetration or aspiration  occurred with individual sips, regardless of bolus size. Barium tablet was given with water, and cleared the oropharynx without difficulty or delay. Esophageal sweep revealed no apparent stasis or backflow. Recommend continuing with regular diet and thin liquids. Pt voiced concern about the difficulty that he has with swallowing. Given hx GERD and pt report, pt was given written information regarding dietary and behavioral management of esophageal dysmotility, and was encouraged to try these strategies for effectiveness.  Also recommend consideration of ENT consult if pt's voice quality does not return to normal.  No further ST intervention is recommended at this time. Please reconsult if needs arise. SLP Visit Diagnosis Dysphagia, pharyngeal phase (R13.13) Impact on safety and function Mild aspiration risk   CHL IP TREATMENT RECOMMENDATION 01/25/2018 Treatment Recommendations No treatment recommended at this time   Prognosis 01/25/2018 Prognosis for Safe Diet Advancement Good Barriers to Reach Goals Other (Comment) CHL IP DIET RECOMMENDATION 01/25/2018 SLP Diet Recommendations Regular solids;Thin liquid Liquid Administration via Cup;Straw Medication Administration Whole meds with liquid Compensations Minimize environmental distractions;Slow rate;Small sips/bites;Follow solids with liquid Postural Changes Seated upright at 90 degrees Remain semi-upright after after feeds/meals    CHL IP OTHER RECOMMENDATIONS 01/25/2018 Recommended Consults Consider ENT evaluation if pts voice does not return to normal in a timely fashion Oral Care Recommendations Oral care BID    CHL IP ORAL PHASE 01/25/2018 Oral Phase WFL  CHL IP PHARYNGEAL PHASE 01/25/2018 Pharyngeal Phase Impaired Pharyngeal- Thin Cup Delayed swallow initiation-vallecula;Delayed swallow initiation-pyriform sinuses;Reduced airway/laryngeal closure;Penetration/Aspiration during swallow;Trace aspiration Pharyngeal Material does not enter airway;Material enters airway, passes  BELOW cords and not ejected out despite cough attempt by patient Pharyngeal- Thin Straw Delayed swallow initiation-vallecula;Delayed swallow initiation-pyriform sinuses Pharyngeal- Puree WFL Pharyngeal- Regular WFL Pharyngeal- Pill WFL  CHL IP CERVICAL ESOPHAGEAL PHASE 01/25/2018 Cervical Esophageal Phase [facility] Celia B. Murvin Natal [facility], CCC-SLP Speech Language Pathologist [phone] [place] 01/25/2018, 3:10 PM               Scheduled Meds: . chlorhexidine  15 mL Mouth Rinse BID  .  Chlorhexidine Gluconate Cloth  6 each Topical Daily  . doxepin  10 mg Oral QHS  . folic acid  1 mg Oral Daily  . heparin injection (subcutaneous)  5,000 Units Subcutaneous Q8H  . insulin aspart  0-15 Units Subcutaneous TID WC  . insulin aspart  0-5 Units Subcutaneous QHS  . insulin aspart  4 Units Subcutaneous TID WC  . insulin detemir  15 Units Subcutaneous BID  . mouth rinse  15 mL Mouth Rinse q12n4p  . pantoprazole  40 mg Oral Daily  . potassium chloride  50 mEq Oral Daily  . sodium chloride flush  10-40 mL Intracatheter Q12H  . traZODone  150 mg Oral QHS    Continuous Infusions: . ampicillin-sulbactam (UNASYN) IV Stopped (01/25/18 1613)  . dextrose 5 % and 0.45 % NaCl with KCl 40 mEq/L Stopped (01/23/18 1408)     LOS: 5 days     Briant Cedar, MD [facility]   If 7PM-7AM, please contact night-coverage [email or web address] Password [place] 01/25/2018, 6:53 PM

## 2018-01-25 NOTE — Evaluation (Signed)
Clinical/Bedside Swallow Evaluation Patient Details  Name: [name] MRN: [id number] Date of Birth: December 22, 1985  Today's Date: 01/25/2018 Time: SLP Start Time (ACUTE ONLY): 1200 SLP Stop Time (ACUTE ONLY): 1230 SLP Time Calculation (min) (ACUTE ONLY): 30 min  Past Medical History:  Past Medical History:  Diagnosis Date  . Anemia   . Anxiety   . Arthritis    "both hips; both shoulders" (11/05/2017)  . Chicken pox   . Childhood asthma   . Chronic pain   . Depression   . DKA (diabetic ketoacidoses) ([name]) 11/18/2014  . DKA (diabetic ketoacidoses) ([name]) 07/05/2017  . GERD (gastroesophageal reflux disease)   . Hypertension   . Migraine    "a few/year" (06/06/2017)  . Noncompliance with medication regimen   . Pneumonia    "several times" (06/06/2017)  . Polysubstance abuse ([name])   . Schizo affective schizophrenia ([name])   . Scoliosis   . Type I diabetes mellitus ([name])    Past Surgical History:  Past Surgical History:  Procedure Laterality Date  . CARDIAC CATHETERIZATION  11/2016  . INCISION AND DRAINAGE ABSCESS Left 11/2011   "MRSA removed off my thumb"   HPI:  32 year old male admitted 01/20/18 with DKA and AMS. Pt was intubated 2/3 - 5/19. PMH significant for GERD, PNA, polysubstance abuse, schizoaffective schizophrenia, DM type 1, medical noncompliance   Assessment / Plan / Recommendation Clinical Impression  Pt presents with poor dentition, history of abscesses. Normal oral motor strength and function. Pt voice quality noted to be hoarse, almost diplophonic, s/p extubation (pt was intubated 2/3 through 2/5 of this admission). Pt reports frequent globus sensation with regurgitation necessary. Pt was given trials of thin liquid, puree, and solid consistencies. Cough noted only following large consecutive boluses of thin liquid via cup. Smaller sips of thin liquid via cup and straw did not result in cough response. No overt s/s aspiration with puree or solid.  Pt reports  difficulty chewing foods due to state of dentition, but also indicates food "sticking" when he eats.   Based on history and current presentation, recommend proceeding with MBS to objectively assess swallow function and safety, and identify need for additional esophageal work up, safest diet and appropriate course of treatment. ENT consult may be beneficial if voice quality does not return to normal, as damage to vocal folds may have occurred due to intubation.   MBS is scheduled with radiology for 1330 today. Report to follow. Pt, MD, and RN aware and inagreement.   SLP Visit Diagnosis: Dysphagia, unspecified (R13.10)    Aspiration Risk  Mild aspiration risk    Diet Recommendation Regular;Thin liquid   Liquid Administration via: (pending MBS) Supervision: Patient able to self feed Compensations: Minimize environmental distractions;Slow rate;Small sips/bites Postural Changes: Seated upright at 90 degrees;Remain upright for at least 30 minutes after po intake    Other  Recommendations Recommended Consults: Consider ENT evaluation(if voice quality does not return to normal) Oral Care Recommendations: Oral care QID   Follow up Recommendations  pending MBS     Frequency and Duration   pending MBS         Prognosis   fair - history of noncompliance     Swallow Study   General Date of Onset: 01/20/18 HPI: 32 year old male admitted 01/20/18 with DKA and AMS. Pt was intubated 2/3 - 5/19. PMH significant for GERD, PNA, polysubstance abuse, schizoaffective schizophrenia, DM type 1, medical noncompliance Type of Study: Bedside Swallow Evaluation Previous Swallow Assessment:  BSE October 2018 - no overt s/s aspiration. Esophagram October 2014 negative. Diet Prior to this Study: Regular;Thin liquids Temperature Spikes Noted: No Respiratory Status: Room air History of Recent Intubation: Yes Length of Intubations (days): 3 days Date extubated: 01/22/18 Behavior/Cognition: Alert;Pleasant  mood;Cooperative Oral Cavity Assessment: Within Functional Limits Oral Care Completed by SLP: No Oral Cavity - Dentition: Poor condition;Missing dentition Vision: Functional for self-feeding Self-Feeding Abilities: Able to feed self Patient Positioning: Upright in bed Baseline Vocal Quality: Hoarse Volitional Cough: Weak Volitional Swallow: Able to elicit    Oral/Motor/Sensory Function Overall Oral Motor/Sensory Function: Within functional limits   Ice Chips Ice chips: Not tested   Thin Liquid Thin Liquid: Within functional limits Presentation: Cup;Straw;Self Fed    Nectar Thick Nectar Thick Liquid: Not tested   Honey Thick Honey Thick Liquid: Not tested   Puree Puree: Within functional limits Presentation: Spoon   Solid   GO   Solid: Within functional limits Presentation: Self Fed       Celia B. Murvin Natal [facility], CCC-SLP Speech Language Pathologist [phone]  [place] [place] 01/25/2018,12:46 PM

## 2018-01-25 NOTE — Progress Notes (Signed)
Pt complaining of choking on food. Does not complaining of SOB just fullness in throat. After a few coughs, he cleared it up. Burnadette Pop, MD. Speech eval ordered. Will continue to monitor pt

## 2018-01-25 NOTE — Progress Notes (Signed)
CRITICAL VALUE ALERT  Critical Value:  Lactic Acid 2.9   Date & Time Notied:  01/25/2018 1654  Provider Notified: Sharolyn Douglas, MD  Orders Received/Actions taken: awaiting orders

## 2018-01-25 NOTE — Progress Notes (Signed)
Pt lactic acid elevated at 2.5, previous value 2.9, pt asymptomatic, NaCl bolus infusing. Tat, MD notified.

## 2018-01-25 NOTE — Progress Notes (Signed)
Pt is refusing IV fluids stating it burns him and he absolutely refuses it. Pt refused morning labs and bed alarm. I explained the reason for the bed alarm, pt became agitated and stated, "Do not patronize me. I will walk and get up when I want to." Pt is also vomiting and can not keep anything down. Complaining of pain in lower back. Karle Starch, MD made aware.

## 2018-01-25 NOTE — Progress Notes (Addendum)
Pt CBG at 0735 was 161. Pt refused insulin. Educated pt on importance of insulin. Checked CBG at 1145 was 483, notified [place], MD, ordered to administer total of 19 units of novolog and scheduled levemir. Rechecked CBG at 1325 was 429, notified Ezenduka, MD ordered to give 15 more units of insulin. Pt had blood drawn at 1600, CBG was 154. Rechecked CBG after pt returned from XRAY at 1700 was 68. Pt eating dinner tray and snacks. Will continue to monitor pts CBG.

## 2018-01-25 NOTE — Consult Note (Signed)
Date of Admission:  01/20/2018          Reason for Consult: Possible Septic Emboli     Referring Provider: Dr. Andreas Newport  Assessment: [name] is a 32 y.o. male with uncontrolled Type 1 DM, schizoaffective disorder, and asthma who presented to the ED on 2/3 with signs and symptoms of severe DKA. Subsequent CTA obtained for pleuritic chest pain illustrated small pulmonary nodular lesions throughout the lungs involving all lobes in segments ranging in size from 4 mm to 1.2 cm. Several of the larger nodular lesion shows subtle cavitation and have somewhat ill-defined borders. The appearance is felt to be most indicative of diffuse septic embolism, along with bilateral free flowing pleural effusions. Although blood cultures are negative thus far, endocarditis needs to be ruled out with these findings. Even without findings consistent with endocarditis, the most likely source would be the patient's oral cavity. He does not appear to have any signs or symptoms of thrombophlebitis. We would recommend checking an HIV in this patient. Patient also has a chronic left foot ulcer (heel) that will need further imaging to ensure he does not have osteomyelitis.   Plan: 1. TEE on 2/11 to evaluated for endocarditis  2. Ordered orthopantogram and please consult Dr. Kristin Bruins 3. Ordered left foot x-ray to evaluate for osteomyelitis/foreign body  4. Will start Unasyn  5. Continue to follow blood cultures  6. Checking HIV  Principal Problem:   Altered mental status Active Problems:   DKA (diabetic ketoacidoses) ([name])   Endotracheally intubated   Increased anion gap metabolic acidosis   Pressure injury of skin  Scheduled Meds: . chlorhexidine  15 mL Mouth Rinse BID  . Chlorhexidine Gluconate Cloth  6 each Topical Daily  . doxepin  10 mg Oral QHS  . folic acid  1 mg Oral Daily  . heparin injection (subcutaneous)  5,000 Units Subcutaneous Q8H  . insulin aspart  0-15 Units Subcutaneous TID  WC  . insulin aspart  0-5 Units Subcutaneous QHS  . insulin aspart  4 Units Subcutaneous TID WC  . insulin detemir  15 Units Subcutaneous BID  . mouth rinse  15 mL Mouth Rinse q12n4p  . pantoprazole  40 mg Oral Daily  . potassium chloride  50 mEq Oral Daily  . sodium chloride flush  10-40 mL Intracatheter Q12H  . traZODone  150 mg Oral QHS   Continuous Infusions: . dextrose 5 % and 0.45 % NaCl with KCl 40 mEq/L Stopped (01/23/18 1408)   PRN Meds:.gi cocktail, haloperidol lactate, sodium chloride flush, sodium chloride flush, traMADol  HPI: [name] is a 32 y.o. male with uncontrolled Type 1 DM, schizoaffective disorder, and asthma who presented to the ED on 2/3 with signs and symptoms of severe DKA. He was subsequently taken to the ICU and intubated due to AMS and inability to protect his airway. He was extubated on 2/5. CTA on the chest on 2/7 due to pleuritic chest pain showing small pulmonary nodular lesions throughout the lungs involving all lobes in segments ranging in size from 4 mm to 1.2 cm. Several of the larger nodular lesion shows subtle cavitation and have somewhat ill-defined borders. The appearance is felt to be most indicative of diffuse septic embolism, along with bilateral free flowing pleural effusions. Leukocytosis has since resolved.   Today the patient is doing well he ruminates on his ability to swallow. He states that he is unsure of what happened leading to his hospitalization and  has not been feeling sick but for the past few months he has been having difficulty swallowing. He endorses a globus sensation with eating solid foods. He denies cough, chest pain, or SOB prior to be intubated but since being extubated has had these symptoms. He does endorse frequent oral abscesses along his gum line with the most recent on being 1 week ago. He denies sore throat/unilateral swelling, new rashes, recent travel, diarrhea, myalgias/arthralgias, visual changes, hematuria. He  does have 2 blisters on his left foot, one is the result of trauma and the other he is unsure how he got. Denies a history of IV drug use but did use methamphetamines in 2012. Denies a history of STIs. Has not been sexually active in 5 years and prior to was only sexually active with 1 male.   Abx: Ceftriaxone 2/4 -> 1 dose Vancomycin 2/4 -> 2/7 Zosyn 2/4 -> 2/7  Blood Cultures: BC 2/3 NGTD [facility] 2/7 pending   Review of Systems: Review of Systems  Constitutional: Negative for chills, fever, malaise/fatigue and weight loss.  HENT: Positive for sore throat. Negative for congestion.   Eyes: Negative for pain and discharge.  Respiratory: Positive for cough, sputum production and stridor. Negative for hemoptysis and shortness of breath.   Cardiovascular: Positive for chest pain and leg swelling. Negative for palpitations.  Gastrointestinal: Negative for abdominal pain, diarrhea, nausea and vomiting.  Genitourinary: Negative for dysuria, frequency and urgency.  Musculoskeletal: Negative for joint pain and myalgias.  Skin: Negative for itching and rash.  Neurological: Negative for focal weakness, seizures and headaches.   Past Medical History:  Diagnosis Date  . Anemia   . Anxiety   . Arthritis    "both hips; both shoulders" (11/05/2017)  . Chicken pox   . Childhood asthma   . Chronic pain   . Depression   . DKA (diabetic ketoacidoses) ([name]) 11/18/2014  . DKA (diabetic ketoacidoses) ([name]) 07/05/2017  . GERD (gastroesophageal reflux disease)   . Hypertension   . Migraine    "a few/year" (06/06/2017)  . Noncompliance with medication regimen   . Pneumonia    "several times" (06/06/2017)  . Polysubstance abuse ([name])   . Schizo affective schizophrenia ([name])   . Scoliosis   . Type I diabetes mellitus ([name])    Social History   Tobacco Use  . Smoking status: Current Every Day Smoker    Packs/day: 0.50    Years: 9.00    Pack years: 4.50    Types: Cigarettes  . Smokeless tobacco:  Never Used  Substance Use Topics  . Alcohol use: No  . Drug use: Yes    Types: Marijuana    Comment: 11/05/2017 "none since 2012"; pt denies hx of cocaine and methamphetamines use on 11/05/2017   Family History  Problem Relation Age of Onset  . Diabetes Mother    Allergies  Allergen Reactions  . Sulfa Antibiotics Other (See Comments)    Unknown childhood allergy  . Fentanyl Other (See Comments)    Patient had hallucinations with the medication   OBJECTIVE: Blood pressure 108/67, pulse (!) 102, temperature 98.2 F (36.8 C), temperature source Oral, resp. rate 16, height 6' (1.829 m), SpO2 98 %.  Physical Exam  Constitutional: He is oriented to person, place, and time.  Thin male in no acute distress  HENT:  Head: Normocephalic and atraumatic.  Multiple dental caries with erythema of the surrounding gum line. Posterior oropharynx appears cobblestone. No LAD or tenderness to palpation   Eyes: Conjunctivae  and EOM are normal. Pupils are equal, round, and reactive to light.  Neck: Normal range of motion. Neck supple.  Cardiovascular: Normal rate, regular rhythm, normal heart sounds and intact distal pulses.  Pulmonary/Chest: Effort normal and breath sounds normal. He has no wheezes. He has no rales.  Abdominal: Soft. Bowel sounds are normal. He exhibits no distension. There is no tenderness.  Musculoskeletal: Normal range of motion. He exhibits no edema.  2 blisters on the left foot, no surrounding erythema or purulent discharge.  Lymphadenopathy:    He has no cervical adenopathy.  Neurological: He is alert and oriented to person, place, and time.  Skin: Skin is warm and dry. No rash noted. He is not diaphoretic.  Stage 1 pressure ulcer overlying the coccyx   Lab Results Lab Results  Component Value Date   WBC 6.2 01/24/2018   HGB 8.1 (L) 01/24/2018   HCT 24.1 (L) 01/24/2018   MCV 88.3 01/24/2018   PLT 98 (L) 01/24/2018    Lab Results  Component Value Date   CREATININE  1.00 01/24/2018   BUN 11 01/24/2018   NA 145 01/24/2018   K 3.5 01/24/2018   CL 112 (H) 01/24/2018   CO2 25 01/24/2018    Lab Results  Component Value Date   ALT 24 01/23/2018   AST 45 (H) 01/23/2018   ALKPHOS 100 01/23/2018   BILITOT 0.3 01/23/2018    Microbiology: Recent Results (from the past 240 hour(s))  Blood culture (routine x 2)     Status: None (Preliminary result)   Collection Time: 01/20/18  7:25 PM  Result Value Ref Range Status   Specimen Description BLOOD LEFT WRIST  Final   Special Requests   Final    BOTTLES DRAWN AEROBIC ONLY Blood Culture adequate volume   Culture   Final    NO GROWTH 4 DAYS Performed at [facility], [address], [place], [place] [address]    Report Status PENDING  Incomplete  Blood culture (routine x 2)     Status: None (Preliminary result)   Collection Time: 01/20/18  7:30 PM  Result Value Ref Range Status   Specimen Description BLOOD RIGHT WRIST  Final   Special Requests   Final    BOTTLES DRAWN AEROBIC ONLY Blood Culture adequate volume   Culture   Final    NO GROWTH 4 DAYS Performed at [facility], [address], [place], [place] [address]    Report Status PENDING  Incomplete  Culture, blood (routine x 2)     Status: None (Preliminary result)   Collection Time: 01/24/18 10:02 PM  Result Value Ref Range Status   Specimen Description RIGHT ANTECUBITAL  Final   Special Requests AEROBIC BOTTLE ONLY Blood Culture adequate volume  Final   Culture PENDING  Incomplete   Report Status PENDING  Incomplete  Culture, blood (routine x 2)     Status: None (Preliminary result)   Collection Time: 01/24/18 10:12 PM  Result Value Ref Range Status   Specimen Description BLOOD RIGHT HAND  Final   Special Requests AEROBIC BOTTLE ONLY Blood Culture adequate volume  Final   Culture PENDING  Incomplete   Report Status PENDING  Incomplete   Levora Dredge, MD [facility] [id number]  [phone] pager  01/25/2018, 10:11 AM

## 2018-01-25 NOTE — Progress Notes (Signed)
Pt refused continuous pulse ox.

## 2018-01-25 NOTE — Progress Notes (Addendum)
Pt refused blood work this morning. Agreed to have blood drawn from midline. IV team paged, they are not able to draw labs from midline-no blood return.  MD notified.

## 2018-01-25 NOTE — Progress Notes (Signed)
Pharmacy Antibiotic Note  [name] is a 32 y.o. male admitted on 01/20/2018 with DKA.  Pharmacy has been consulted for Unasyn dosing for dental abscess. Pulmonary nodular lesions seen on CT likely due to diffuse septic embolism. TEE to r/o endocarditis pending. Xray left foot to r/o osteomyelitis pending. ID following.  Plan: Begin Unasyn 3 g IV q6h Monitor renal function, clinical progress, culture data, and LOT F/U TEE and Xray results  Height: 6' (182.9 cm)(measured) IBW/kg (Calculated) : 77.6  Temp (24hrs), Avg:98.3 F (36.8 C), Min:98.2 F (36.8 C), Max:98.3 F (36.8 C)  Recent Labs  Lab 01/20/18 1812  01/20/18 2324 01/21/18 0142  01/21/18 1152 01/21/18 1655 01/22/18 0357 01/22/18 0456 01/23/18 0304 01/24/18 1241  WBC 42.5*  --   --   --   --   --   --   --  13.3* 9.1 6.2  CREATININE  --    < > 3.21*  --    < > 2.54* 2.28* 1.93*  --  1.34* 1.00  LATICACIDVEN  --   --  1.8 1.8  --   --   --   --   --   --   --    < > = values in this interval not displayed.    Estimated Creatinine Clearance: 97.5 mL/min (by C-G formula based on SCr of 1 mg/dL).    Allergies  Allergen Reactions  . Sulfa Antibiotics Other (See Comments)    Unknown childhood allergy  . Fentanyl Other (See Comments)    Patient had hallucinations with the medication    Antimicrobials this admission: Ceftriaxone 2/3 x1 Zosyn 2/4 >> 2/5 Vancomycin 2/4 x1 Unasyn 2/8 >>  Dose adjustments this admission:   Microbiology results: 2/3 BCx: NGTD 2/7 BCx:   Thank you for allowing pharmacy to be a part of this patient's care.  Loura Back, [name], BCPS Clinical Pharmacist Phone for today [phone] Main pharmacy - [phone] 01/25/2018 1:05 PM

## 2018-01-25 NOTE — Progress Notes (Addendum)
Initial CBG 483, paged [place], MD and ordered to give total of 19 units of insulin, rechecked CBG an hour later, CBG 429. Notified MD again. Ordered to give 15 more units. Will recheck CBG once pt returns from XRAY. Will continue to monitor.

## 2018-01-25 NOTE — Progress Notes (Signed)
Pt refused insulin in the morning. Educated pt on importance of insulin administration. TEE scheduled for Monday 2/11, NPO status discontinued. Pt ordered morning tray. Will continue to monitor pt.

## 2018-01-25 NOTE — Consult Note (Addendum)
Modified Barium Swallow Progress Note  Patient Details  Name: [name] MRN: [id number] Date of Birth: 01-Nov-1986  Today's Date: 01/25/2018  Modified Barium Swallow completed.  Full report located under Chart Review in the Imaging Section.  Brief recommendations include the following:  Clinical Impression Pt presents with normal oral swallow, mild pharyngeal dysphagia. Pharyngeally, Swallow reflex is delayed on thin liquids, with trigger noted at the level of the pyriform sinuses.  Other consistencies triggered in a timely fashion. No post-swallow residue was seen on any consistency. No penetration or aspiration of puree or solid consistencies was noted. Frank aspiration of thin liquids was seen during large consecutive bouses, but no penetration or aspiration occurred with individual sips, regardless of bolus size. Barium tablet was given with water, and cleared the oropharynx without difficulty or delay. Esophageal sweep revealed no apparent stasis or backflow.   Recommend continuing with regular diet and thin liquids. Pt voiced concern about the difficulty that he has with swallowing. Given hx GERD and pt report, pt was given written information regarding dietary and behavioral management of esophageal dysmotility, and was encouraged to try these strategies for effectiveness.    Also recommend consideration of ENT consult if pt's voice quality does not return to normal.    No further ST intervention is recommended at this time. Please reconsult if needs arise.   Swallow Evaluation Recommendations  Recommended Consults: Consider ENT evaluation   SLP Diet Recommendations: Regular solids;Thin liquid - one swallow at a time   Liquid Administration via: Cup;Straw  Medication Administration: Whole meds with liquid  Supervision: Patient able to self feed  Compensations: Minimize environmental distractions;Slow rate;Small sips/bites;Follow solids with liquid  Postural Changes: Remain  semi-upright after after feeds/meals (Comment);Seated upright at 90 degrees   Oral Care Recommendations: Oral care BID  Celia B. Murvin [facility], CCC-SLP Speech Language Pathologist [phone]  Leigh Aurora 01/25/2018,3:16 PM

## 2018-01-26 LAB — CBC
HEMATOCRIT: 23.7 % — AB (ref 39.0–52.0)
Hemoglobin: 7.8 g/dL — ABNORMAL LOW (ref 13.0–17.0)
MCH: 29 pg (ref 26.0–34.0)
MCHC: 32.9 g/dL (ref 30.0–36.0)
MCV: 88.1 fL (ref 78.0–100.0)
Platelets: 142 10*3/uL — ABNORMAL LOW (ref 150–400)
RBC: 2.69 MIL/uL — ABNORMAL LOW (ref 4.22–5.81)
RDW: 14 % (ref 11.5–15.5)
WBC: 6.8 10*3/uL (ref 4.0–10.5)

## 2018-01-26 LAB — GLUCOSE, CAPILLARY
GLUCOSE-CAPILLARY: 123 mg/dL — AB (ref 65–99)
GLUCOSE-CAPILLARY: 129 mg/dL — AB (ref 65–99)
GLUCOSE-CAPILLARY: 45 mg/dL — AB (ref 65–99)
GLUCOSE-CAPILLARY: 98 mg/dL (ref 65–99)
Glucose-Capillary: 89 mg/dL (ref 65–99)
Glucose-Capillary: 155 mg/dL — ABNORMAL HIGH (ref 65–99)

## 2018-01-26 LAB — BASIC METABOLIC PANEL
Anion gap: 10 (ref 5–15)
BUN: 12 mg/dL (ref 6–20)
CALCIUM: 7.9 mg/dL — AB (ref 8.9–10.3)
CHLORIDE: 103 mmol/L (ref 101–111)
CO2: 27 mmol/L (ref 22–32)
CREATININE: 1.11 mg/dL (ref 0.61–1.24)
GFR calc non Af Amer: >60 mL/min (ref >60)
Glucose, Bld: 82 mg/dL (ref 65–99)
Potassium: 3.8 mmol/L (ref 3.5–5.1)
SODIUM: 140 mmol/L (ref 135–145)

## 2018-01-26 LAB — HEMOGLOBIN A1C
Hgb A1c MFr Bld: 9.8 % — ABNORMAL HIGH (ref 4.8–5.6)
MEAN PLASMA GLUCOSE: 234.56 mg/dL

## 2018-01-26 LAB — PREALBUMIN: Prealbumin: 8.5 mg/dL — ABNORMAL LOW (ref 18–38)

## 2018-01-26 LAB — C-REACTIVE PROTEIN: CRP: 2.9 mg/dL — ABNORMAL HIGH (ref <1.0)

## 2018-01-26 LAB — MAGNESIUM: Magnesium: 1.6 mg/dL — ABNORMAL LOW (ref 1.7–2.4)

## 2018-01-26 LAB — SEDIMENTATION RATE: Sed Rate: 77 mm/hr — ABNORMAL HIGH (ref 0–16)

## 2018-01-26 LAB — HIV ANTIBODY (ROUTINE TESTING W REFLEX)
HIV Screen 4th Generation wRfx: NONREACTIVE
HIV Screen 4th Generation wRfx: NONREACTIVE

## 2018-01-26 LAB — LACTIC ACID, PLASMA: LACTIC ACID, VENOUS: 1.3 mmol/L (ref 0.5–1.9)

## 2018-01-26 MED ORDER — GERHARDT'S BUTT CREAM
TOPICAL_CREAM | Freq: Two times a day (BID) | CUTANEOUS | Status: DC
  Administered 2018-01-26: 18:00:00 via TOPICAL
  Administered 2018-01-27: 1 via TOPICAL
  Administered 2018-01-27 – 2018-01-30 (×6): via TOPICAL
  Administered 2018-02-01: 1 via TOPICAL
  Administered 2018-02-01 – 2018-02-02 (×2): via TOPICAL
  Filled 2018-01-26: qty 1

## 2018-01-26 MED ORDER — GLUCERNA SHAKE PO LIQD
237.0000 mL | Freq: Two times a day (BID) | ORAL | Status: DC
  Administered 2018-01-26 – 2018-02-02 (×13): 237 mL via ORAL

## 2018-01-26 MED ORDER — ADULT MULTIVITAMIN W/MINERALS CH
1.0000 | ORAL_TABLET | Freq: Every day | ORAL | Status: DC
Start: 2018-01-26 — End: 2018-02-02
  Administered 2018-01-26 – 2018-02-02 (×7): 1 via ORAL
  Filled 2018-01-26 (×8): qty 1

## 2018-01-26 MED ORDER — NICOTINE 21 MG/24HR TD PT24
21.0000 mg | MEDICATED_PATCH | TRANSDERMAL | Status: DC
  Administered 2018-01-26 – 2018-02-01 (×8): 21 mg via TRANSDERMAL
  Filled 2018-01-26 (×7): qty 1

## 2018-01-26 MED ORDER — PHENOL 1.4 % MT LIQD
1.0000 | OROMUCOSAL | Status: AC | PRN
  Filled 2018-01-26: qty 177

## 2018-01-26 NOTE — Progress Notes (Signed)
Patient was complaining of feeling dizzy. CBG checked - 45. Patient received 240 cc Orange juice and 230 cc Ensure. Repeat CBG - 89. MD made aware. Will continue to monitor.

## 2018-01-26 NOTE — Progress Notes (Signed)
PROGRESS NOTE  [name] UEK:[id number] DOB: 1986-04-09 DOA: 01/20/2018 PCP: Fleet Contras, MD  HPI/Recap of past 20 hours: 32 year old male with PMHx of Asthma, Anemia, DM Type 1, GERD, HTN, Schizoaffective disorder, Depression with multiple admissions secondary to DKA 2/2 medical non-compliance. Pt presented to ED on 2/3 with recurrent DKA. Upon arrival patient is hypotensive, hyperglycemic, and unresponsive.Per EMS patient found with crushed pills around him. Roommate called EMS, however, unsure how long patient has been unresponsive.VBG with pH of 6.6. Glucose 1175. Started on Insulin and Bicarb gtt, given one liter of NS. Intubated for airway protection and admitted to the ICU. Pt was extubated remained stable and was transferred to [facility] on 01/23/18.  Today, patient reported feeling better, still reports the feeling of food getting stuck in his throat. Denies any chest pain, abdominal pain, fever/chills. BS better controlled, hoarse voice improving.   Assessment/Plan: Principal Problem:   Altered mental status Active Problems:   DKA (diabetic ketoacidoses) ([name])   Respiratory failure ([name])   Endotracheally intubated   Increased anion gap metabolic acidosis   Pressure injury of skin   Ankle ulcer ([name])   Dental abscess   Lung nodules  Pulmonary nodular lesions likely due to diffuse septic embolism ?? Source from multiple dental caries Afebrile, with resolved leukocytosis LA 1.8, procalcitonin elevated at 3.04 BC X 2 done on 01/20/18, NGTD, repeat NGTD, HIV neg CT chest angio: showed pulm nodular lesion likely due to diffuse septic embolism ID consulted: rec TEE, will be done on 2/11, NPO on 2/10, orthopantogram done. Paged Dr Kristin Bruins, [phone], awaiting response Continue Unasyn Tramadol for pain  Chronic diabetic left foot ulcer Currently afebrile with no leukocytosis,, no signs of overt infection Foot xray showed: No acute abnormality  noted Wound care team, diabetes coordinator consulted  Non-cardiac chest pain Atypical Trop X 2, flat trend 0.04, no acute ST changes D-dimer elevated, CT angio as above Monitor closely  Acute respiratory failure with hypoxia  Resolved  Multifactorial metabolic encephalopathy, asthma  Diabetes Type 1 uncontrolled with complications/ DKA Fluctuating BS, brittle DM Continue Levemir 15 levemir BID, Novolog 4 units TID with meals, Moderate SSI  Dysphagia/GERD Mod barium swallow done, rec regular food with thin liquids  If persistent, ?? EGD Vs ENT consult  Hypovolemic shock Resolved Secondary to DKA/dehydration  Chronic Diastolic CHF 1/30 echocardiogram: LVEF 60-65%, -RIGHT ventricle moderately dilated Atrial septum:  increased thickness of the septum, consistent with lipomatous hypertrophy. Strict in and out, Daily weight  Essential HTN Stable  Acute kidney injury (baseline Cr<1)  Resolved Secondary to DKA/uncontrolled diabetes Continue hydration  Hypokalemia/Hypophosphatemia/Hypomagnesemia Replace prn, monitor daily  Normocytic anemia Mixed picture Folate low 3.3, iron 34, TIBC low, Vit B12 825 Start folic acid Occult blood pending Daily CBC   ??Attempted Suicide Patient found down with multiple crushed pills around him. Unfortunately upon admission UDS not obtained Psychiatric on board: continue doxepin, no further recs  Schizoaffective disorder Continue Trazodone 150 mg QHS, Doxepin 10 mg QHS Haldol IV 2 mg QID prn     Code Status: Full  Family Communication: None at bedside  Disposition Plan: TBD   Consultants:  PCCM  Psychiatry  ID  Procedures:  Intubation  Antimicrobials:  IV Unasyn  DVT prophylaxis:  Valentine Heparin   Objective: Vitals:   01/25/18 2111 01/26/18 0632 01/26/18 0648 01/26/18 1259  BP: 124/75 (!) 94/49 (!) 108/56 117/69  Pulse: (!) 107 (!) 117 (!) 110 (!) 105  Resp: 18  18 18 18   Temp: 98.7 F (37.1 C) (!)  97.5 F (36.4 C) 98 F (36.7 C) 98 F (36.7 C)  TempSrc: Oral Oral Oral Oral  SpO2: 100% 100% 100% 100%  Height:        Intake/Output Summary (Last 24 hours) at 01/26/2018 1732 Last data filed at 01/26/2018 1611 Gross per 24 hour  Intake 1061.25 ml  Output -  Net 1061.25 ml   There were no vitals filed for this visit.  Exam:   General: Alert, awake, ill-appearing, cachetic  Cardiovascular: S1-S2 present, no added hrt sound  Respiratory: Chest clear bilaterally   Abdomen: Soft, non-tender, non-distended, BS present   Musculoskeletal: No bilateral lower extremity edema  Skin: Normal  Psychiatry: Normal mood   Data Reviewed: CBC: Recent Labs  Lab 01/22/18 0456 01/23/18 0304 01/24/18 1241 01/25/18 1600 01/26/18 0157  WBC 13.3* 9.1 6.2 5.2 6.8  HGB 9.1* 8.1* 8.1* 7.5* 7.8*  HCT 26.1* 23.8* 24.1* 22.6* 23.7*  MCV 83.9 85.0 88.3 87.3 88.1  PLT 186 136* 98* 122* 142*   Basic Metabolic Panel: Recent Labs  Lab 01/21/18 0448  01/22/18 0357 01/23/18 0304 01/23/18 1505 01/24/18 1241 01/25/18 1600 01/26/18 0157  NA 142   < > 149* 147*  --  145 138 140  K 3.1*   < > 4.1 3.4* 4.2 3.5 4.4 3.8  CL 112*   < > 121* 119*  --  112* 103 103  CO2 7*   < > 17* 19*  --  25 23 27   GLUCOSE 556*   < > 158* 141*  --  116* 154* 82  BUN 43*   < > 32* 19  --  11 15 12   CREATININE 2.99*   < > 1.93* 1.34*  --  1.00 1.02 1.11  CALCIUM 7.4*   < > 7.3* 7.7*  --  8.0* 7.9* 7.9*  MG 2.0  --  1.6*  --  1.5* 1.7 1.7 1.6*  PHOS <1.0*  --  2.9 2.0*  --   --   --   --    < > = values in this interval not displayed.   GFR: Estimated Creatinine Clearance: 87.8 mL/min (by C-G formula based on SCr of 1.11 mg/dL). Liver Function Tests: Recent Labs  Lab 01/23/18 0304  AST 45*  ALT 24  ALKPHOS 100  BILITOT 0.3  PROT 4.1*  ALBUMIN 2.1*  2.2*   Recent Labs  Lab 01/22/18 0900  LIPASE 35   No results for input(s): AMMONIA in the last 168 hours. Coagulation Profile: No results for  input(s): INR, PROTIME in the last 168 hours. Cardiac Enzymes: Recent Labs  Lab 01/23/18 1505 01/23/18 1921 01/24/18 2143  TROPONINI 0.04* 0.04* <0.03   BNP (last 3 results) No results for input(s): PROBNP in the last 8760 hours. HbA1C: Recent Labs    01/26/18 0157  HGBA1C 9.8*   CBG: Recent Labs  Lab 01/26/18 0823 01/26/18 1223 01/26/18 1431 01/26/18 1550 01/26/18 1730  GLUCAP 123* 129* 45* 89 98   Lipid Profile: No results for input(s): CHOL, HDL, LDLCALC, TRIG, CHOLHDL, LDLDIRECT in the last 72 hours. Thyroid Function Tests: No results for input(s): TSH, T4TOTAL, FREET4, T3FREE, THYROIDAB in the last 72 hours. Anemia Panel: No results for input(s): VITAMINB12, FOLATE, FERRITIN, TIBC, IRON, RETICCTPCT in the last 72 hours. Urine analysis:    Component Value Date/Time   COLORURINE STRAW (A) 01/25/2018 0609   APPEARANCEUR CLEAR 01/25/2018 0609   LABSPEC 1.010 01/25/2018 03/25/2018  PHURINE 7.0 01/25/2018 0609   GLUCOSEU 150 (A) 01/25/2018 0609   HGBUR NEGATIVE 01/25/2018 0609   BILIRUBINUR NEGATIVE 01/25/2018 0609   BILIRUBINUR negative 03/29/2015 1114   KETONESUR NEGATIVE 01/25/2018 0609   PROTEINUR NEGATIVE 01/25/2018 0609   UROBILINOGEN 0.2 05/17/2015 1810   NITRITE NEGATIVE 01/25/2018 0609   LEUKOCYTESUR NEGATIVE 01/25/2018 0609   Sepsis Labs: @LABRCNTIP (procalcitonin:4,lacticidven:4)  ) Recent Results (from the past 240 hour(s))  Blood culture (routine x 2)     Status: None   Collection Time: 01/20/18  7:25 PM  Result Value Ref Range Status   Specimen Description BLOOD LEFT WRIST  Final   Special Requests   Final    BOTTLES DRAWN AEROBIC ONLY Blood Culture adequate volume   Culture   Final    NO GROWTH 5 DAYS Performed at [facility], [address] [place], [place] [address]    Report Status 01/25/2018 FINAL  Final  Blood culture (routine x 2)     Status: None   Collection Time: 01/20/18  7:30 PM  Result Value Ref Range Status   Specimen  Description BLOOD RIGHT WRIST  Final   Special Requests   Final    BOTTLES DRAWN AEROBIC ONLY Blood Culture adequate volume   Culture   Final    NO GROWTH 5 DAYS Performed at [facility], [address], [place], [place] [address]    Report Status 01/25/2018 FINAL  Final  Culture, blood (routine x 2)     Status: None (Preliminary result)   Collection Time: 01/24/18 10:02 PM  Result Value Ref Range Status   Specimen Description BLOOD RIGHT ANTECUBITAL  Final   Special Requests AEROBIC BOTTLE ONLY Blood Culture adequate volume  Final   Culture   Final    NO GROWTH 1 DAY Performed at [facility], [address], [place], [place] [address]    Report Status PENDING  Incomplete  Culture, blood (routine x 2)     Status: None (Preliminary result)   Collection Time: 01/24/18 10:12 PM  Result Value Ref Range Status   Specimen Description BLOOD RIGHT HAND  Final   Special Requests AEROBIC BOTTLE ONLY Blood Culture adequate volume  Final   Culture   Final    NO GROWTH 1 DAY Performed at [facility], [address] [place], [place] [address]    Report Status PENDING  Incomplete      Studies: No results found.  Scheduled Meds: . chlorhexidine  15 mL Mouth Rinse BID  . Chlorhexidine Gluconate Cloth  6 each Topical Daily  . doxepin  10 mg Oral QHS  . folic acid  1 mg Oral Daily  . Gerhardt's butt cream   Topical BID  . heparin injection (subcutaneous)  5,000 Units Subcutaneous Q8H  . insulin aspart  0-15 Units Subcutaneous TID WC  . insulin aspart  0-5 Units Subcutaneous QHS  . insulin aspart  4 Units Subcutaneous TID WC  . insulin detemir  15 Units Subcutaneous BID  . mouth rinse  15 mL Mouth Rinse q12n4p  . nicotine  21 mg Transdermal Q24H  . pantoprazole  40 mg Oral Daily  . potassium chloride  50 mEq Oral Daily  . sodium chloride flush  10-40 mL Intracatheter Q12H  . traZODone  150 mg Oral QHS    Continuous Infusions: . sodium chloride 1,000 mL  (01/26/18 1416)  . ampicillin-sulbactam (UNASYN) IV Stopped (01/26/18 1447)     LOS: 6  days     Briant Cedar, MD [facility]   If 7PM-7AM, please contact night-coverage [email or web address] Password [facility] 01/26/2018, 5:32 PM

## 2018-01-26 NOTE — Progress Notes (Signed)
Initial Nutrition Assessment  DOCUMENTATION CODES:  Non-severe (moderate) malnutrition in context of chronic illness  INTERVENTION:  Glucerna Shake po BID, each supplement provides 220 kcal and 10 grams of protein  No bread as patient says he cannot swallow this (will pass to dietary)  MVI with minerals  Patient has wide array of complaints outside of RD scope (see body of not for further details)  Wants esophageal dilation when goes for TEE to help resolve his dysphagia  Has SOB, complaints of not being able to expand lungs fully/feels liquid in lungs, questions if needs inhaler  Has pain related to PU to sacrum  Has BLE edema he reports is new  NUTRITION DIAGNOSIS:  Moderate Malnutrition related to chronic illness(Uncontrolled DM in setting os psych illness) as evidenced by moderate muscle/fat mass  GOAL:  Patient will meet greater than or equal to 90% of their needs MONITOR:  PO intake, Supplement acceptance, Weight trends, I & O's  REASON FOR ASSESSMENT:  Consult Wound healing  ASSESSMENT:  32 y/o male PMhx HTN, DM1, GERD, RA Anxiety/depression, Polysubstance abuse, Schizoaffective Schizophrenia, noncompliance, repeated admissions for DKA d/t noncompliance. Presented again w/ DKA and severe metabolic acidosis leading to resp compromise, ultimately required intubation. Extubated 2/5.   Since extubation, patient found to have multiple cavitary nodules throughout lungs concerning for septic emboli. He has poor dentition w/ recently lanced abscess in mouth. Also w/ chronic diabetic foot ulcer on foot. RD consulted for wound healing.   Per meal documentation, Patient has been eating at least 50% of meals since 2/6.   RD met w/ pt to assess barriers to intake in attempt to promote better nutrition for wound healing.   Pt today has several complaints. He says he is having a hard time swallowing bread specifically. He does not want bread, but says dietary will not allow this.  Re-explained what a consistent carb diet was. He should be able to not have bread and get carbs from other sources. He denies having trouble swallowing any other texture or consistency  He says he is having increased SOB and feels he cannot expand his lungs fully. He is wondering if he needs an inhaler. Told RD will forward concern to MD.   He asks to have his throat stretched whenever he goes for his TEE next week because he thinks this will resolve his dysphagia. Told pt RD will forward concern to MD  He has mild BLE edema. He says this is new. This concerns him. RD will forward concern to MD.  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 He is requesting pain medication. RD will forward concern to MD.  Denies any n/v (other than some he relates to dysphagia). Also has intermittent diarrhea/constipation.   Long term, patient says he has been struggling with severe GERD since he got out of prison (approximately July). He says it was so bad it made him vomit. He says he reflux is what put him in the hospital and ultimately led to his intubation.   PTA, He reports UBW of 140 lbs.  Pt's weight history is quite difficult to trend due to his recurrent admission requiring fluid large amounts of fluid resuscitation. His wt appears to be overall stable and does seem to hover around 140 lbs.   Physical Exam: Mod muscle wasting of temporalis, interosseous, clavicular musculature, deltoids, gastrocnemius, quadriceps. Moderate fat  wasting of thorax/underarm, mild fat wasting of orbitals  Labs: H/H:7.8/23.7, CRP:2.9, A1C:9.8, BGs 45-130, Mag:1.6 Meds: Folate, Insulin, PPI, KCL, IV abx,   NUTRITION - FOCUSED PHYSICAL EXAM:   Most Recent Value  Orbital Region  Mild depletion  Upper Arm Region  Moderate depletion  Thoracic and Lumbar Region  Moderate depletion  Buccal Region  No depletion  Temple Region  Moderate  depletion  Clavicle Bone Region  Moderate depletion  Clavicle and Acromion Bone Region  Moderate depletion  Scapular Bone Region  Unable to assess  Dorsal Hand  Moderate depletion  Patellar Region  Moderate depletion  Anterior Thigh Region  Moderate depletion  Edema (RD Assessment)  None     Diet Order:  Diet Carb Modified Fluid consistency: Thin; Room service appropriate? Yes  EDUCATION NEEDS:  Not appropriate for education at this time  Skin: Unstageable wound to L heel, Stage 1 to sacrum, Abrasion to wrist  Last BM:  2/8   Height:  Ht Readings from Last 1 Encounters:  01/20/18 6' (1.829 m)   Weight:  Wt Readings from Last 1 Encounters:  01/16/18 144 lb 10 oz (65.6 kg)   Wt Readings from Last 10 Encounters:  01/16/18 144 lb 10 oz (65.6 kg)  12/10/17 141 lb 12.1 oz (64.3 kg)  12/04/17 155 lb (70.3 kg)  11/03/17 142 lb 6.7 oz (64.6 kg)  10/22/17 152 lb 3.2 oz (69 kg)  10/12/17 149 lb 4 oz (67.7 kg)  09/13/17 173 lb (78.5 kg)  09/07/17 172 lb 2.9 oz (78.1 kg)  08/17/17 196 lb 3.4 oz (89 kg)  08/08/17 196 lb 10.4 oz (89.2 kg)   Ideal Body Weight:  80.91 kg  BMI:  Body mass index is 19.61 kg/m.  Estimated Nutritional Needs:  Kcal:  2100-2300 (32-35 kcal/kg bw) Protein:  92-105g Pro (1.4-1.6 g/kg bw) Fluid:  >2.3 L fluid (35 ml/kg bw)  Burtis Junes RD, LDN, CNSC Clinical Nutrition Pager: [phone] 01/26/2018 5:44 PM

## 2018-01-27 ENCOUNTER — Inpatient Hospital Stay (HOSPITAL_COMMUNITY): Payer: Medicare Other

## 2018-01-27 DIAGNOSIS — L97309 Non-pressure chronic ulcer of unspecified ankle with unspecified severity: Secondary | ICD-10-CM

## 2018-01-27 LAB — GLUCOSE, CAPILLARY
GLUCOSE-CAPILLARY: 96 mg/dL (ref 65–99)
Glucose-Capillary: 44 mg/dL — CL (ref 65–99)
Glucose-Capillary: 135 mg/dL — ABNORMAL HIGH (ref 65–99)
Glucose-Capillary: 123 mg/dL — ABNORMAL HIGH (ref 65–99)
Glucose-Capillary: 144 mg/dL — ABNORMAL HIGH (ref 65–99)
Glucose-Capillary: 61 mg/dL — ABNORMAL LOW (ref 65–99)
Glucose-Capillary: 255 mg/dL — ABNORMAL HIGH (ref 65–99)

## 2018-01-27 LAB — CBC
HCT: 26.5 % — ABNORMAL LOW (ref 39.0–52.0)
HEMOGLOBIN: 8.7 g/dL — AB (ref 13.0–17.0)
MCH: 29.6 pg (ref 26.0–34.0)
MCHC: 32.8 g/dL (ref 30.0–36.0)
MCV: 90.1 fL (ref 78.0–100.0)
Platelets: 217 10*3/uL (ref 150–400)
RBC: 2.94 MIL/uL — AB (ref 4.22–5.81)
RDW: 14.1 % (ref 11.5–15.5)
WBC: 5.3 10*3/uL (ref 4.0–10.5)

## 2018-01-27 LAB — BASIC METABOLIC PANEL
ANION GAP: 11 (ref 5–15)
BUN: 11 mg/dL (ref 6–20)
CALCIUM: 8.4 mg/dL — AB (ref 8.9–10.3)
CHLORIDE: 103 mmol/L (ref 101–111)
CO2: 26 mmol/L (ref 22–32)
Creatinine, Ser: 0.87 mg/dL (ref 0.61–1.24)
GFR calc non Af Amer: >60 mL/min (ref >60)
GLUCOSE: 47 mg/dL — AB (ref 65–99)
Potassium: 3.9 mmol/L (ref 3.5–5.1)
Sodium: 140 mmol/L (ref 135–145)

## 2018-01-27 MED ORDER — INSULIN DETEMIR 100 UNIT/ML ~~LOC~~ SOLN
12.0000 [IU] | Freq: Two times a day (BID) | SUBCUTANEOUS | Status: DC
  Administered 2018-01-27 – 2018-02-02 (×12): 12 [IU] via SUBCUTANEOUS
  Filled 2018-01-27 (×14): qty 0.12

## 2018-01-27 MED ORDER — DEXTROSE 50 % IV SOLN
INTRAVENOUS | Status: AC
  Administered 2018-01-27: 50 mL via INTRAVENOUS
  Filled 2018-01-27: qty 50

## 2018-01-27 MED ORDER — INSULIN ASPART 100 UNIT/ML ~~LOC~~ SOLN
0.0000 [IU] | Freq: Three times a day (TID) | SUBCUTANEOUS | Status: DC
  Administered 2018-01-27: 1 [IU] via SUBCUTANEOUS
  Administered 2018-01-28: 2 [IU] via SUBCUTANEOUS
  Administered 2018-01-28: 1 [IU] via SUBCUTANEOUS
  Administered 2018-01-28: 2 [IU] via SUBCUTANEOUS
  Administered 2018-01-29: 5 [IU] via SUBCUTANEOUS
  Administered 2018-01-29: 3 [IU] via SUBCUTANEOUS
  Administered 2018-01-29: 2 [IU] via SUBCUTANEOUS
  Administered 2018-01-30 (×3): 3 [IU] via SUBCUTANEOUS
  Administered 2018-02-01: 2 [IU] via SUBCUTANEOUS
  Administered 2018-02-02: 5 [IU] via SUBCUTANEOUS
  Administered 2018-02-02: 1 [IU] via SUBCUTANEOUS

## 2018-01-27 MED ORDER — DEXTROSE 50 % IV SOLN
50.0000 mL | Freq: Once | INTRAVENOUS | Status: AC
  Administered 2018-01-27: 50 mL via INTRAVENOUS

## 2018-01-27 MED ORDER — BUTALBITAL-APAP-CAFFEINE 50-325-40 MG PO TABS
2.0000 | ORAL_TABLET | Freq: Once | ORAL | Status: AC
  Administered 2018-01-27: 2 via ORAL
  Filled 2018-01-27: qty 2

## 2018-01-27 MED ORDER — INSULIN ASPART 100 UNIT/ML ~~LOC~~ SOLN
3.0000 [IU] | Freq: Three times a day (TID) | SUBCUTANEOUS | Status: DC
  Administered 2018-01-27 – 2018-02-02 (×15): 3 [IU] via SUBCUTANEOUS

## 2018-01-27 MED ORDER — METOCLOPRAMIDE HCL 5 MG/ML IJ SOLN
10.0000 mg | Freq: Once | INTRAMUSCULAR | Status: AC
  Administered 2018-01-27: 10 mg via INTRAVENOUS
  Filled 2018-01-27: qty 2

## 2018-01-27 MED ORDER — DEXTROSE 50 % IV SOLN: 25.0000 g | Freq: Once | INTRAVENOUS | Status: DC | PRN

## 2018-01-27 MED ORDER — ONDANSETRON HCL 4 MG/2ML IJ SOLN
4.0000 mg | Freq: Three times a day (TID) | INTRAMUSCULAR | Status: DC | PRN
  Administered 2018-01-27 – 2018-01-31 (×3): 4 mg via INTRAVENOUS
  Filled 2018-01-27 (×2): qty 2

## 2018-01-27 MED ORDER — ACETAMINOPHEN 325 MG PO TABS
650.0000 mg | ORAL_TABLET | Freq: Four times a day (QID) | ORAL | Status: DC | PRN
  Administered 2018-01-30: 650 mg via ORAL
  Filled 2018-01-27: qty 2

## 2018-01-27 NOTE — Progress Notes (Signed)
ABI prelim: WNL. [name] Eunice, RDMS, RVT  

## 2018-01-27 NOTE — Anesthesia Preprocedure Evaluation (Addendum)
Anesthesia Evaluation  Patient identified by MRN, date of birth, ID band Patient awake    Reviewed: Allergy & Precautions, H&P , NPO status , Patient's Chart, lab work & pertinent test results  Airway Mallampati: I  TM Distance: >3 FB Neck ROM: Full    Dental no notable dental hx. (+) Teeth Intact, Dental Advisory Given   Pulmonary asthma , Current Smoker,    Pulmonary exam normal breath sounds clear to auscultation       Cardiovascular Exercise Tolerance: Good hypertension,  Rhythm:Regular Rate:Normal     Neuro/Psych  Headaches, Anxiety Depression Schizophrenia    GI/Hepatic Neg liver ROS, GERD  ,  Endo/Other  diabetes, Poorly Controlled, Insulin Dependent  Renal/GU negative Renal ROS  negative genitourinary   Musculoskeletal  (+) Arthritis , Osteoarthritis,    Abdominal   Peds  Hematology negative hematology ROS (+) anemia ,   Anesthesia Other Findings   Reproductive/Obstetrics negative OB ROS                            Anesthesia Physical Anesthesia Plan  ASA: III  Anesthesia Plan: MAC   Post-op Pain Management:    Induction: Intravenous  PONV Risk Score and Plan: 0 and Propofol infusion  Airway Management Planned: Nasal Cannula  Additional Equipment:   Intra-op Plan:   Post-operative Plan:   Informed Consent: I have reviewed the patients History and Physical, chart, labs and discussed the procedure including the risks, benefits and alternatives for the proposed anesthesia with the patient or authorized representative who has indicated his/her understanding and acceptance.   Dental advisory given  Plan Discussed with: CRNA  Anesthesia Plan Comments:         Anesthesia Quick Evaluation

## 2018-01-27 NOTE — Progress Notes (Signed)
  Inpatient Diabetes Program Recommendations  AACE/ADA: New Consensus Statement on Inpatient Glycemic Control (2015)  Target Ranges:  Prepandial:   less than 140 mg/dL      Peak postprandial:   less than 180 mg/dL (1-2 hours)      Critically ill patients:  140 - 180 mg/dL   Results for BARBARA, KENG (MRN [id number]) as of 01/27/2018 08:50  Ref. Range 01/26/2018 08:23 01/26/2018 12:23 01/26/2018 14:31 01/26/2018 15:50 01/26/2018 17:30 01/26/2018 21:31  Glucose-Capillary Latest Ref Range: 65 - 99 mg/dL 086 (H)  6 units Novolog 129 (H)  6 units Novolog +  15 units Levemir 45 (L) 89 98 155 (H)  15 units Levemir   Results for KWAN, SHELLHAMMER (MRN [id number]) as of 01/27/2018 08:50  Ref. Range 01/27/2018 02:16 01/27/2018 02:45 01/27/2018 08:03  Glucose-Capillary Latest Ref Range: 65 - 99 mg/dL 44 (LL) 528 (H) 413 (H)  6 units Novolog   Home DM Meds: Levemir 18 units BID       Novolog 0-15 units TID       Novolog 6 units TID with meals   Current Orders: Levemir 15 units BID      Novolog Moderate Correction Scale/ SSI (0-15 units) TID AC + HS      Novolog 4 units TID with meals     MD- Note patient with Hypoglycemia yesterday at 2:30pm and again at 2:16 am today.  Please consider the following insulin adjustments:  1. Reduce Levemir slightly to 12 units BID  2. Reduce Novolog SSI to Sensitive scale (0-9 units) TID AC + HS  3. Reduce Novolog Meal Coverage slightly to: Novolog 3 units TID with meals (hold if pt eats <50% of meal)     --Will follow patient during hospitalization--  Ambrose Finland RN, MSN, CDE Diabetes Coordinator Inpatient Glycemic Control Team Team Pager: [phone] (8a-5p)

## 2018-01-27 NOTE — Progress Notes (Signed)
Hypoglycemic Event  CBG: 44  Treatment: D50 IV 50 mL  Symptoms: Pale and Shaky  Follow-up CBG: Time:0245 CBG Result:135  Possible Reasons for Event: other  Comments/MD notified: D50 given    [name]

## 2018-01-27 NOTE — H&P (View-Only) (Signed)
PROGRESS NOTE  DENARIO BAGOT TDV:[id number] DOB: 03-20-1986 DOA: 01/20/2018 PCP: Fleet Contras, MD  HPI/Recap of past 49 hours: 32 year old male with PMHx of Asthma, Anemia, DM Type 1, GERD, HTN, Schizoaffective disorder, Depression with multiple admissions secondary to DKA 2/2 medical non-compliance. Pt presented to ED on 2/3 with recurrent DKA. Upon arrival patient is hypotensive, hyperglycemic, and unresponsive.Per EMS patient found with crushed pills around him. Roommate called EMS, however, unsure how long patient has been unresponsive.VBG with pH of 6.6. Glucose 1175. Started on Insulin and Bicarb gtt, given one liter of NS. Intubated for airway protection and admitted to the ICU. Pt was extubated remained stable and was transferred to [facility] on 01/23/18.  Today, patient reported feeling better, still reports feeling of food getting stuck in his throat. Episodes of hypoglycemia noted. Denies any chest pain, abdominal pain, fever/chills.   Assessment/Plan: Principal Problem:   Altered mental status Active Problems:   DKA (diabetic ketoacidoses) ([name])   Respiratory failure ([name])   Endotracheally intubated   Increased anion gap metabolic acidosis   Pressure injury of skin   Ankle ulcer ([name])   Dental abscess   Lung nodules  Pulmonary nodular lesions likely due to diffuse septic embolism ?? Source from multiple dental caries Afebrile, with resolved leukocytosis LA 1.8, procalcitonin elevated at 3.04 BC X 2 done on 01/20/18, NGTD, repeat NGTD, HIV neg CT chest angio: showed pulm nodular lesion likely due to diffuse septic embolism ID consulted: rec TEE, will be done on 2/11, orthopantogram done. Paged Dr Kristin Bruins, [phone], awaiting response Continue Unasyn Tramadol for pain  Chronic diabetic left foot ulcer Currently afebrile with no leukocytosis,, no signs of overt infection Foot xray showed: No acute abnormality noted Wound care team, diabetes  coordinator consulted  Non-cardiac chest pain Atypical Trop X 2, flat trend 0.04, no acute ST changes D-dimer elevated, CT angio as above Monitor closely  Acute respiratory failure with hypoxia  Resolved  Multifactorial metabolic encephalopathy, asthma  Diabetes Type 1 uncontrolled with complications/ DKA Fluctuating BS, with episodes of hypoglycemia-->brittle DM Adjust insulin regimen prn Levemir, Novolog TID with meals, SSI  Dysphagia/GERD Mod barium swallow done, rec regular food with thin liquids  If persistent, ?? EGD Vs ENT consult  Hypovolemic shock Resolved Secondary to DKA/dehydration  Chronic Diastolic CHF 1/30 echocardiogram: LVEF 60-65%, -RIGHT ventricle moderately dilated Atrial septum:  increased thickness of the septum, consistent with lipomatous hypertrophy. Strict in and out, Daily weight  Essential HTN Stable  Acute kidney injury (baseline Cr<1)  Resolved Secondary to DKA/uncontrolled diabetes Continue hydration  Hypokalemia/Hypophosphatemia/Hypomagnesemia Replace prn, monitor daily  Normocytic anemia Mixed picture Folate low 3.3, iron 34, TIBC low, Vit B12 825 Start folic acid Occult blood pending Daily CBC   ??Attempted Suicide Patient found down with multiple crushed pills around him. Unfortunately upon admission UDS not obtained Psychiatric on board: continue doxepin, no further recs  Schizoaffective disorder Continue Trazodone 150 mg QHS, Doxepin 10 mg QHS Haldol IV 2 mg QID prn     Code Status: Full  Family Communication: None at bedside  Disposition Plan: TBD   Consultants:  PCCM  Psychiatry  ID  Procedures:  Intubation  Antimicrobials:  IV Unasyn  DVT prophylaxis:  [place] Heparin   Objective: Vitals:   01/26/18 1259 01/26/18 2136 01/27/18 0601 01/27/18 1342  BP: 117/69 119/74 108/67 123/73  Pulse: (!) 105 (!) 109 (!) 113 (!) 115  Resp: 18 18 17 20   Temp: 98 F (36.7  C) 98.3 F (36.8 C) 98.2 F  (36.8 C) 98.4 F (36.9 C)  TempSrc: Oral Oral Oral Oral  SpO2: 100% 100% 98% 100%  Height:        Intake/Output Summary (Last 24 hours) at 01/27/2018 1804 Last data filed at 01/27/2018 1633 Gross per 24 hour  Intake 1671.25 ml  Output -  Net 1671.25 ml   There were no vitals filed for this visit.  Exam:   General: Alert, awake, ill-appearing, cachetic  Cardiovascular: S1-S2 present, no added hrt sound  Respiratory: Chest clear bilaterally   Abdomen: Soft, non-tender, non-distended, BS present   Musculoskeletal: No bilateral lower extremity edema  Skin: Normal  Psychiatry: Normal mood   Data Reviewed: CBC: Recent Labs  Lab 01/23/18 0304 01/24/18 1241 01/25/18 1600 01/26/18 0157 01/27/18 0215  WBC 9.1 6.2 5.2 6.8 5.3  HGB 8.1* 8.1* 7.5* 7.8* 8.7*  HCT 23.8* 24.1* 22.6* 23.7* 26.5*  MCV 85.0 88.3 87.3 88.1 90.1  PLT 136* 98* 122* 142* 217   Basic Metabolic Panel: Recent Labs  Lab 01/21/18 0448  01/22/18 0357 01/23/18 0304 01/23/18 1505 01/24/18 1241 01/25/18 1600 01/26/18 0157 01/27/18 0215  NA 142   < > 149* 147*  --  145 138 140 140  K 3.1*   < > 4.1 3.4* 4.2 3.5 4.4 3.8 3.9  CL 112*   < > 121* 119*  --  112* 103 103 103  CO2 7*   < > 17* 19*  --  25 23 27 26  GLUCOSE 556*   < > 158* 141*  --  116* 154* 82 47*  BUN 43*   < > 32* 19  --  11 15 12 11  CREATININE 2.99*   < > 1.93* 1.34*  --  1.00 1.02 1.11 0.87  CALCIUM 7.4*   < > 7.3* 7.7*  --  8.0* 7.9* 7.9* 8.4*  MG 2.0  --  1.6*  --  1.5* 1.7 1.7 1.6*  --   PHOS <1.0*  --  2.9 2.0*  --   --   --   --   --    < > = values in this interval not displayed.   GFR: Estimated Creatinine Clearance: 112.1 mL/min (by C-G formula based on SCr of 0.87 mg/dL). Liver Function Tests: Recent Labs  Lab 01/23/18 0304  AST 45*  ALT 24  ALKPHOS 100  BILITOT 0.3  PROT 4.1*  ALBUMIN 2.1*  2.2*   Recent Labs  Lab 01/22/18 0900  LIPASE 35   No results for input(s): AMMONIA in the last 168  hours. Coagulation Profile: No results for input(s): INR, PROTIME in the last 168 hours. Cardiac Enzymes: Recent Labs  Lab 01/23/18 1505 01/23/18 1921 01/24/18 2143  TROPONINI 0.04* 0.04* <0.03   BNP (last 3 results) No results for input(s): PROBNP in the last 8760 hours. HbA1C: Recent Labs    01/26/18 0157  HGBA1C 9.8*   CBG: Recent Labs  Lab 01/27/18 0216 01/27/18 0245 01/27/18 0803 01/27/18 1212 01/27/18 1657  GLUCAP 44* 135* 123* 144* 61*   Lipid Profile: No results for input(s): CHOL, HDL, LDLCALC, TRIG, CHOLHDL, LDLDIRECT in the last 72 hours. Thyroid Function Tests: No results for input(s): TSH, T4TOTAL, FREET4, T3FREE, THYROIDAB in the last 72 hours. Anemia Panel: No results for input(s): VITAMINB12, FOLATE, FERRITIN, TIBC, IRON, RETICCTPCT in the last 72 hours. Urine analysis:    Component Value Date/Time   COLORURINE STRAW (A) 01/25/2018 0609   APPEARANCEUR CLEAR 01/25/2018   0609   LABSPEC 1.010 01/25/2018 0609   PHURINE 7.0 01/25/2018 0609   GLUCOSEU 150 (A) 01/25/2018 0609   HGBUR NEGATIVE 01/25/2018 0609   BILIRUBINUR NEGATIVE 01/25/2018 0609   BILIRUBINUR negative 03/29/2015 1114   KETONESUR NEGATIVE 01/25/2018 0609   PROTEINUR NEGATIVE 01/25/2018 0609   UROBILINOGEN 0.2 05/17/2015 1810   NITRITE NEGATIVE 01/25/2018 0609   LEUKOCYTESUR NEGATIVE 01/25/2018 0609   Sepsis Labs: @LABRCNTIP (procalcitonin:4,lacticidven:4)  ) Recent Results (from the past 240 hour(s))  Blood culture (routine x 2)     Status: None   Collection Time: 01/20/18  7:25 PM  Result Value Ref Range Status   Specimen Description BLOOD LEFT WRIST  Final   Special Requests   Final    BOTTLES DRAWN AEROBIC ONLY Blood Culture adequate volume   Culture   Final    NO GROWTH 5 DAYS Performed at [facility], [address], [place], [place] [address]    Report Status 01/25/2018 FINAL  Final  Blood culture (routine x 2)     Status: None   Collection Time: 01/20/18  7:30  PM  Result Value Ref Range Status   Specimen Description BLOOD RIGHT WRIST  Final   Special Requests   Final    BOTTLES DRAWN AEROBIC ONLY Blood Culture adequate volume   Culture   Final    NO GROWTH 5 DAYS Performed at [facility], [address], [place], [place] [address]    Report Status 01/25/2018 FINAL  Final  Culture, blood (routine x 2)     Status: None (Preliminary result)   Collection Time: 01/24/18 10:02 PM  Result Value Ref Range Status   Specimen Description BLOOD RIGHT ANTECUBITAL  Final   Special Requests AEROBIC BOTTLE ONLY Blood Culture adequate volume  Final   Culture   Final    NO GROWTH 2 DAYS Performed at [facility], [address] [place], [place] [address]    Report Status PENDING  Incomplete  Culture, blood (routine x 2)     Status: None (Preliminary result)   Collection Time: 01/24/18 10:12 PM  Result Value Ref Range Status   Specimen Description BLOOD RIGHT HAND  Final   Special Requests AEROBIC BOTTLE ONLY Blood Culture adequate volume  Final   Culture   Final    NO GROWTH 2 DAYS Performed at [facility], [address] [place], [place] [address]    Report Status PENDING  Incomplete      Studies: No results found.  Scheduled Meds: . chlorhexidine  15 mL Mouth Rinse BID  . Chlorhexidine Gluconate Cloth  6 each Topical Daily  . doxepin  10 mg Oral QHS  . feeding supplement (GLUCERNA SHAKE)  237 mL Oral BID BM  . folic acid  1 mg Oral Daily  . Gerhardt's butt cream   Topical BID  . heparin injection (subcutaneous)  5,000 Units Subcutaneous Q8H  . insulin aspart  0-5 Units Subcutaneous QHS  . insulin aspart  0-9 Units Subcutaneous TID WC  . insulin aspart  3 Units Subcutaneous TID WC  . insulin detemir  12 Units Subcutaneous BID  . mouth rinse  15 mL Mouth Rinse q12n4p  . multivitamin with minerals  1 tablet Oral Daily  . nicotine  21 mg Transdermal Q24H  . pantoprazole  40 mg Oral Daily  . potassium chloride  50 mEq  Oral Daily  . sodium chloride flush  10-40 mL Intracatheter Q12H  . traZODone  150 mg Oral QHS    Continuous Infusions: . ampicillin-sulbactam (UNASYN) IV Stopped (01/27/18 1447)     LOS: 7 days     Briant Cedar, MD [facility]   If 7PM-7AM, please contact night-coverage [email or web address] Password [facility] 01/27/2018, 6:04 PM

## 2018-01-27 NOTE — Progress Notes (Signed)
PROGRESS NOTE  DENARIO BAGOT TDV:[id number] DOB: 03-20-1986 DOA: 01/20/2018 PCP: Fleet Contras, MD  HPI/Recap of past 49 hours: 32 year old male with PMHx of Asthma, Anemia, DM Type 1, GERD, HTN, Schizoaffective disorder, Depression with multiple admissions secondary to DKA 2/2 medical non-compliance. Pt presented to ED on 2/3 with recurrent DKA. Upon arrival patient is hypotensive, hyperglycemic, and unresponsive.Per EMS patient found with crushed pills around him. Roommate called EMS, however, unsure how long patient has been unresponsive.VBG with pH of 6.6. Glucose 1175. Started on Insulin and Bicarb gtt, given one liter of NS. Intubated for airway protection and admitted to the ICU. Pt was extubated remained stable and was transferred to [facility] hospitalist service on 01/23/18.  Today, patient reported feeling better, still reports feeling of food getting stuck in his throat. Episodes of hypoglycemia noted. Denies any chest pain, abdominal pain, fever/chills.   Assessment/Plan: Principal Problem:   Altered mental status Active Problems:   DKA (diabetic ketoacidoses) ([name])   Respiratory failure ([name])   Endotracheally intubated   Increased anion gap metabolic acidosis   Pressure injury of skin   Ankle ulcer ([name])   Dental abscess   Lung nodules  Pulmonary nodular lesions likely due to diffuse septic embolism ?? Source from multiple dental caries Afebrile, with resolved leukocytosis LA 1.8, procalcitonin elevated at 3.04 BC X 2 done on 01/20/18, NGTD, repeat NGTD, HIV neg CT chest angio: showed pulm nodular lesion likely due to diffuse septic embolism ID consulted: rec TEE, will be done on 2/11, orthopantogram done. Paged Dr Kristin Bruins, [phone], awaiting response Continue Unasyn Tramadol for pain  Chronic diabetic left foot ulcer Currently afebrile with no leukocytosis,, no signs of overt infection Foot xray showed: No acute abnormality noted Wound care team, diabetes  coordinator consulted  Non-cardiac chest pain Atypical Trop X 2, flat trend 0.04, no acute ST changes D-dimer elevated, CT angio as above Monitor closely  Acute respiratory failure with hypoxia  Resolved  Multifactorial metabolic encephalopathy, asthma  Diabetes Type 1 uncontrolled with complications/ DKA Fluctuating BS, with episodes of hypoglycemia-->brittle DM Adjust insulin regimen prn Levemir, Novolog TID with meals, SSI  Dysphagia/GERD Mod barium swallow done, rec regular food with thin liquids  If persistent, ?? EGD Vs ENT consult  Hypovolemic shock Resolved Secondary to DKA/dehydration  Chronic Diastolic CHF 1/30 echocardiogram: LVEF 60-65%, -RIGHT ventricle moderately dilated Atrial septum:  increased thickness of the septum, consistent with lipomatous hypertrophy. Strict in and out, Daily weight  Essential HTN Stable  Acute kidney injury (baseline Cr<1)  Resolved Secondary to DKA/uncontrolled diabetes Continue hydration  Hypokalemia/Hypophosphatemia/Hypomagnesemia Replace prn, monitor daily  Normocytic anemia Mixed picture Folate low 3.3, iron 34, TIBC low, Vit B12 825 Start folic acid Occult blood pending Daily CBC   ??Attempted Suicide Patient found down with multiple crushed pills around him. Unfortunately upon admission UDS not obtained Psychiatric on board: continue doxepin, no further recs  Schizoaffective disorder Continue Trazodone 150 mg QHS, Doxepin 10 mg QHS Haldol IV 2 mg QID prn     Code Status: Full  Family Communication: None at bedside  Disposition Plan: TBD   Consultants:  PCCM  Psychiatry  ID  Procedures:  Intubation  Antimicrobials:  IV Unasyn  DVT prophylaxis:  [place] Heparin   Objective: Vitals:   01/26/18 1259 01/26/18 2136 01/27/18 0601 01/27/18 1342  BP: 117/69 119/74 108/67 123/73  Pulse: (!) 105 (!) 109 (!) 113 (!) 115  Resp: 18 18 17 20   Temp: 98 F (36.7  C) 98.3 F (36.8 C) 98.2 F  (36.8 C) 98.4 F (36.9 C)  TempSrc: Oral Oral Oral Oral  SpO2: 100% 100% 98% 100%  Height:        Intake/Output Summary (Last 24 hours) at 01/27/2018 1804 Last data filed at 01/27/2018 1633 Gross per 24 hour  Intake 1671.25 ml  Output -  Net 1671.25 ml   There were no vitals filed for this visit.  Exam:   General: Alert, awake, ill-appearing, cachetic  Cardiovascular: S1-S2 present, no added hrt sound  Respiratory: Chest clear bilaterally   Abdomen: Soft, non-tender, non-distended, BS present   Musculoskeletal: No bilateral lower extremity edema  Skin: Normal  Psychiatry: Normal mood   Data Reviewed: CBC: Recent Labs  Lab 01/23/18 0304 01/24/18 1241 01/25/18 1600 01/26/18 0157 01/27/18 0215  WBC 9.1 6.2 5.2 6.8 5.3  HGB 8.1* 8.1* 7.5* 7.8* 8.7*  HCT 23.8* 24.1* 22.6* 23.7* 26.5*  MCV 85.0 88.3 87.3 88.1 90.1  PLT 136* 98* 122* 142* 217   Basic Metabolic Panel: Recent Labs  Lab 01/21/18 0448  01/22/18 0357 01/23/18 0304 01/23/18 1505 01/24/18 1241 01/25/18 1600 01/26/18 0157 01/27/18 0215  NA 142   < > 149* 147*  --  145 138 140 140  K 3.1*   < > 4.1 3.4* 4.2 3.5 4.4 3.8 3.9  CL 112*   < > 121* 119*  --  112* 103 103 103  CO2 7*   < > 17* 19*  --  25 23 27 26   GLUCOSE 556*   < > 158* 141*  --  116* 154* 82 47*  BUN 43*   < > 32* 19  --  11 15 12 11   CREATININE 2.99*   < > 1.93* 1.34*  --  1.00 1.02 1.11 0.87  CALCIUM 7.4*   < > 7.3* 7.7*  --  8.0* 7.9* 7.9* 8.4*  MG 2.0  --  1.6*  --  1.5* 1.7 1.7 1.6*  --   PHOS <1.0*  --  2.9 2.0*  --   --   --   --   --    < > = values in this interval not displayed.   GFR: Estimated Creatinine Clearance: 112.1 mL/min (by C-G formula based on SCr of 0.87 mg/dL). Liver Function Tests: Recent Labs  Lab 01/23/18 0304  AST 45*  ALT 24  ALKPHOS 100  BILITOT 0.3  PROT 4.1*  ALBUMIN 2.1*  2.2*   Recent Labs  Lab 01/22/18 0900  LIPASE 35   No results for input(s): AMMONIA in the last 168  hours. Coagulation Profile: No results for input(s): INR, PROTIME in the last 168 hours. Cardiac Enzymes: Recent Labs  Lab 01/23/18 1505 01/23/18 1921 01/24/18 2143  TROPONINI 0.04* 0.04* <0.03   BNP (last 3 results) No results for input(s): PROBNP in the last 8760 hours. HbA1C: Recent Labs    01/26/18 0157  HGBA1C 9.8*   CBG: Recent Labs  Lab 01/27/18 0216 01/27/18 0245 01/27/18 0803 01/27/18 1212 01/27/18 1657  GLUCAP 44* 135* 123* 144* 61*   Lipid Profile: No results for input(s): CHOL, HDL, LDLCALC, TRIG, CHOLHDL, LDLDIRECT in the last 72 hours. Thyroid Function Tests: No results for input(s): TSH, T4TOTAL, FREET4, T3FREE, THYROIDAB in the last 72 hours. Anemia Panel: No results for input(s): VITAMINB12, FOLATE, FERRITIN, TIBC, IRON, RETICCTPCT in the last 72 hours. Urine analysis:    Component Value Date/Time   COLORURINE STRAW (A) 01/25/2018 0609   APPEARANCEUR CLEAR 01/25/2018  0609   LABSPEC 1.010 01/25/2018 0609   PHURINE 7.0 01/25/2018 0609   GLUCOSEU 150 (A) 01/25/2018 0609   HGBUR NEGATIVE 01/25/2018 0609   BILIRUBINUR NEGATIVE 01/25/2018 0609   BILIRUBINUR negative 03/29/2015 1114   KETONESUR NEGATIVE 01/25/2018 0609   PROTEINUR NEGATIVE 01/25/2018 0609   UROBILINOGEN 0.2 05/17/2015 1810   NITRITE NEGATIVE 01/25/2018 0609   LEUKOCYTESUR NEGATIVE 01/25/2018 0609   Sepsis Labs: @LABRCNTIP (procalcitonin:4,lacticidven:4)  ) Recent Results (from the past 240 hour(s))  Blood culture (routine x 2)     Status: None   Collection Time: 01/20/18  7:25 PM  Result Value Ref Range Status   Specimen Description BLOOD LEFT WRIST  Final   Special Requests   Final    BOTTLES DRAWN AEROBIC ONLY Blood Culture adequate volume   Culture   Final    NO GROWTH 5 DAYS Performed at [facility], [address], [place], [place] [address]    Report Status 01/25/2018 FINAL  Final  Blood culture (routine x 2)     Status: None   Collection Time: 01/20/18  7:30  PM  Result Value Ref Range Status   Specimen Description BLOOD RIGHT WRIST  Final   Special Requests   Final    BOTTLES DRAWN AEROBIC ONLY Blood Culture adequate volume   Culture   Final    NO GROWTH 5 DAYS Performed at [facility], [address], [place], [place] [address]    Report Status 01/25/2018 FINAL  Final  Culture, blood (routine x 2)     Status: None (Preliminary result)   Collection Time: 01/24/18 10:02 PM  Result Value Ref Range Status   Specimen Description BLOOD RIGHT ANTECUBITAL  Final   Special Requests AEROBIC BOTTLE ONLY Blood Culture adequate volume  Final   Culture   Final    NO GROWTH 2 DAYS Performed at [facility], [address] [place], [place] [address]    Report Status PENDING  Incomplete  Culture, blood (routine x 2)     Status: None (Preliminary result)   Collection Time: 01/24/18 10:12 PM  Result Value Ref Range Status   Specimen Description BLOOD RIGHT HAND  Final   Special Requests AEROBIC BOTTLE ONLY Blood Culture adequate volume  Final   Culture   Final    NO GROWTH 2 DAYS Performed at [facility], [address] [place], [place] [address]    Report Status PENDING  Incomplete      Studies: No results found.  Scheduled Meds: . chlorhexidine  15 mL Mouth Rinse BID  . Chlorhexidine Gluconate Cloth  6 each Topical Daily  . doxepin  10 mg Oral QHS  . feeding supplement (GLUCERNA SHAKE)  237 mL Oral BID BM  . folic acid  1 mg Oral Daily  . Gerhardt's butt cream   Topical BID  . heparin injection (subcutaneous)  5,000 Units Subcutaneous Q8H  . insulin aspart  0-5 Units Subcutaneous QHS  . insulin aspart  0-9 Units Subcutaneous TID WC  . insulin aspart  3 Units Subcutaneous TID WC  . insulin detemir  12 Units Subcutaneous BID  . mouth rinse  15 mL Mouth Rinse q12n4p  . multivitamin with minerals  1 tablet Oral Daily  . nicotine  21 mg Transdermal Q24H  . pantoprazole  40 mg Oral Daily  . potassium chloride  50 mEq  Oral Daily  . sodium chloride flush  10-40 mL Intracatheter Q12H  . traZODone  150 mg Oral QHS    Continuous Infusions: . ampicillin-sulbactam (UNASYN) IV Stopped (01/27/18 1447)     LOS: 7 days     Briant Cedar, MD [facility]   If 7PM-7AM, please contact night-coverage [email or web address] Password [facility] 01/27/2018, 6:04 PM

## 2018-01-27 NOTE — Progress Notes (Addendum)
Patient's CBG this night was 61. He received orange juice 200 cc, and CBG came up to 96. MD made aware. Will continue to monitor.

## 2018-01-27 NOTE — Progress Notes (Signed)
Patient complaining this morning that he didn't received the bagel in his second tray. Patient doesn't have an order to receive 2 trays per meal and per clinical nutrition "no bread per pt request". Writer explained to patient but he was yelling and asking for his bagel. MD was notified and I was trying to talk with the dietician who consulted him on 01/26/2018 with no success. Will continue to monitor.

## 2018-01-27 NOTE — Care Management Note (Addendum)
Case Management Note  Patient Details  Name: [name] MRN: [id number] Date of Birth: [birth year]  Subjective/Objective:  32 y.o. M admitted with AMS re: poorly controlled DM. Talked with him about his Hh needs and he is open to [facility] for med management and HHPT to assist with mobility. Confirmed phone and address on facesheet as correct. Made [name], rep for [facility] aware. Will need resumption of care orders, F2F not required as pt has recent hospitalization                  Action/Plan:CM will sign off for now but will be available should additional discharge needs arise or disposition change.    Expected Discharge Date:                  Expected Discharge Plan:  Home/Self Care  In-House Referral:     Discharge planning Services  CM Consult  Post Acute Care Choice:  Home Health Choice offered to:  Patient  DME Arranged:  (states does not need DME) DME Agency:  [facility]  HH Arranged:  RN, PT [facility] Agency:  [facility]  Status of Service:  Completed, signed off  If discussed at Long Length of Stay Meetings, dates discussed:    Additional Comments:  Yvone Neu, RN 01/27/2018, 10:38 AM

## 2018-01-27 NOTE — Progress Notes (Signed)
Patient became nauseous and irritable and requesting something for nausea.  Checked patient blood sugar and it was 44.  Administered D50 and recheck was 135.  Paged the physician on call and also got the patient reglan for nausea.  Patient stated, "the antibiotic was making him nauseous and would not take his 2:00 am dose".  After checking his blood sugar this nurse tried to explain that it was probably the low blood sugar but he would not take his antibiotic.  Will continue to monitor.

## 2018-01-28 ENCOUNTER — Encounter (HOSPITAL_COMMUNITY)
Admission: EM | Disposition: A | Discharge status: Home / Self Care | Payer: Self-pay | Source: Home / Self Care | Attending: Internal Medicine

## 2018-01-28 ENCOUNTER — Inpatient Hospital Stay (HOSPITAL_COMMUNITY): Payer: Medicare Other

## 2018-01-28 ENCOUNTER — Encounter (HOSPITAL_COMMUNITY): Payer: Self-pay | Admitting: *Deleted

## 2018-01-28 ENCOUNTER — Inpatient Hospital Stay (HOSPITAL_COMMUNITY): Payer: Medicare Other | Admitting: Anesthesiology

## 2018-01-28 DIAGNOSIS — E1110 Type 2 diabetes mellitus with ketoacidosis without coma: Secondary | ICD-10-CM

## 2018-01-28 DIAGNOSIS — R918 Other nonspecific abnormal finding of lung field: Secondary | ICD-10-CM

## 2018-01-28 DIAGNOSIS — K083 Retained dental root: Secondary | ICD-10-CM

## 2018-01-28 DIAGNOSIS — K08409 Partial loss of teeth, unspecified cause, unspecified class: Secondary | ICD-10-CM

## 2018-01-28 DIAGNOSIS — J9690 Respiratory failure, unspecified, unspecified whether with hypoxia or hypercapnia: Secondary | ICD-10-CM

## 2018-01-28 DIAGNOSIS — K045 Chronic apical periodontitis: Secondary | ICD-10-CM

## 2018-01-28 DIAGNOSIS — R4182 Altered mental status, unspecified: Secondary | ICD-10-CM

## 2018-01-28 DIAGNOSIS — K036 Deposits [accretions] on teeth: Secondary | ICD-10-CM

## 2018-01-28 DIAGNOSIS — K0530 Chronic periodontitis, unspecified: Secondary | ICD-10-CM

## 2018-01-28 DIAGNOSIS — M264 Malocclusion, unspecified: Secondary | ICD-10-CM

## 2018-01-28 DIAGNOSIS — R7881 Bacteremia: Secondary | ICD-10-CM

## 2018-01-28 DIAGNOSIS — K029 Dental caries, unspecified: Secondary | ICD-10-CM

## 2018-01-28 DIAGNOSIS — K06010 Localized gingival recession, unspecified: Secondary | ICD-10-CM

## 2018-01-28 DIAGNOSIS — M270 Developmental disorders of jaws: Secondary | ICD-10-CM

## 2018-01-28 DIAGNOSIS — R490 Dysphonia: Secondary | ICD-10-CM

## 2018-01-28 HISTORY — PX: TEE WITHOUT CARDIOVERSION: SHX5443

## 2018-01-28 LAB — BASIC METABOLIC PANEL
ANION GAP: 12 (ref 5–15)
BUN: 13 mg/dL (ref 6–20)
CHLORIDE: 104 mmol/L (ref 101–111)
CO2: 27 mmol/L (ref 22–32)
Calcium: 8 mg/dL — ABNORMAL LOW (ref 8.9–10.3)
Creatinine, Ser: 1.04 mg/dL (ref 0.61–1.24)
GFR calc non Af Amer: >60 mL/min (ref >60)
Glucose, Bld: 153 mg/dL — ABNORMAL HIGH (ref 65–99)
Potassium: 4.4 mmol/L (ref 3.5–5.1)
Sodium: 143 mmol/L (ref 135–145)

## 2018-01-28 LAB — CBC
HEMATOCRIT: 22.7 % — AB (ref 39.0–52.0)
HEMOGLOBIN: 7.2 g/dL — AB (ref 13.0–17.0)
MCH: 28.5 pg (ref 26.0–34.0)
MCHC: 31.7 g/dL (ref 30.0–36.0)
MCV: 89.7 fL (ref 78.0–100.0)
Platelets: 170 10*3/uL (ref 150–400)
RBC: 2.53 MIL/uL — AB (ref 4.22–5.81)
RDW: 14.2 % (ref 11.5–15.5)
WBC: 4.1 10*3/uL (ref 4.0–10.5)

## 2018-01-28 LAB — GLUCOSE, CAPILLARY
GLUCOSE-CAPILLARY: 248 mg/dL — AB (ref 65–99)
GLUCOSE-CAPILLARY: 135 mg/dL — AB (ref 65–99)
GLUCOSE-CAPILLARY: 110 mg/dL — AB (ref 65–99)
GLUCOSE-CAPILLARY: 210 mg/dL — AB (ref 65–99)
Glucose-Capillary: 129 mg/dL — ABNORMAL HIGH (ref 65–99)
Glucose-Capillary: 166 mg/dL — ABNORMAL HIGH (ref 65–99)
Glucose-Capillary: 177 mg/dL — ABNORMAL HIGH (ref 65–99)

## 2018-01-28 SURGERY — ECHOCARDIOGRAM, TRANSESOPHAGEAL
Anesthesia: Monitor Anesthesia Care

## 2018-01-28 MED ORDER — ZINC OXIDE 12.8 % EX OINT
TOPICAL_OINTMENT | CUTANEOUS | Status: DC | PRN
Start: 2018-01-28 — End: 2018-02-02
  Administered 2018-01-28: 16:00:00 via TOPICAL
  Filled 2018-01-28: qty 56.7

## 2018-01-28 MED ORDER — BUTALBITAL-APAP-CAFFEINE 50-325-40 MG PO TABS
1.0000 | ORAL_TABLET | Freq: Four times a day (QID) | ORAL | Status: DC | PRN
  Administered 2018-01-28 – 2018-01-30 (×7): 1 via ORAL
  Filled 2018-01-28 (×8): qty 1

## 2018-01-28 MED ORDER — KETOROLAC TROMETHAMINE 15 MG/ML IJ SOLN
15.0000 mg | Freq: Once | INTRAMUSCULAR | Status: AC
  Administered 2018-01-28: 15 mg via INTRAVENOUS
  Filled 2018-01-28: qty 1

## 2018-01-28 MED ORDER — PROPOFOL 500 MG/50ML IV EMUL
INTRAVENOUS | Status: DC | PRN
  Administered 2018-01-28: 150 ug/kg/min via INTRAVENOUS

## 2018-01-28 MED ORDER — LACTATED RINGERS IV SOLN
INTRAVENOUS | Status: AC | PRN
  Administered 2018-01-28: 1000 mL via INTRAVENOUS
  Administered 2018-01-28: 08:00:00 via INTRAVENOUS

## 2018-01-28 MED ORDER — BUTAMBEN-TETRACAINE-BENZOCAINE 2-2-14 % EX AERO
INHALATION_SPRAY | CUTANEOUS | Status: DC | PRN
  Administered 2018-01-28: 2 via TOPICAL

## 2018-01-28 MED ORDER — AMOXICILLIN-POT CLAVULANATE 875-125 MG PO TABS
1.0000 | ORAL_TABLET | Freq: Two times a day (BID) | ORAL | Status: DC
  Administered 2018-01-28 – 2018-02-02 (×10): 1 via ORAL
  Filled 2018-01-28 (×12): qty 1

## 2018-01-28 MED ORDER — PROPOFOL 10 MG/ML IV BOLUS
INTRAVENOUS | Status: DC | PRN
  Administered 2018-01-28 (×2): 20 mg via INTRAVENOUS
  Administered 2018-01-28: 40 mg via INTRAVENOUS
  Administered 2018-01-28: 20 mg via INTRAVENOUS

## 2018-01-28 NOTE — Progress Notes (Signed)
Inpatient Diabetes Program Recommendations  AACE/ADA: New Consensus Statement on Inpatient Glycemic Control (2015)  Target Ranges:  Prepandial:   less than 140 mg/dL      Peak postprandial:   less than 180 mg/dL (1-2 hours)      Critically ill patients:  140 - 180 mg/dL   Lab Results  Component Value Date   GLUCAP 129 (H) 01/28/2018   HGBA1C 9.8 (H) 01/26/2018    Review of Glycemic Control   Home DM Meds: Levemir 18 units BID                             Novolog 0-15 units TID                             Novolog 6 units TID with meals   Current Orders: Levemir 12 units BID                            Novolog Sensitive Correction Scale/ SSI (0-9 units) TID AC + HS                            Novolog 3 units TID with meals   Inpatient Diabetes Program Recommendations:  Noted hypoglycemic event of 61 mg/dL, even with insulin changes yesterday. Patient is NPO for TEE and did not receive AM correction. Thus, not expecting lows today. Will continue to see how insulin changes impact blood sugars today.   Thanks, Lujean Rave, MSN, RNC-OB Diabetes Coordinator [phone] (8a-5p)

## 2018-01-28 NOTE — Consult Note (Signed)
Reason for Consult: Hoarseness  HPI:  [name] is an 32 y.o. male who was initially admitted for treatment of DKA. He was subsequently intubated from 2/3 - 2/5. Upon extubation, he noted significant hoarseness. He complains of a constant raspy voice. He could no longer sing. He also c/o dysphagia. He underwent an MBS study on 2/8. The MBS study showed no penetration or aspiration of puree or solid consistencies. Frank aspiration of thin liquids was seen during large consecutive boluses, but no penetration or aspiration occurred with individual sips, regardless of bolus size. The patient has a history of GERD. He is on protonix.  Past Medical History:  Diagnosis Date  . Anemia   . Anxiety   . Arthritis    "both hips; both shoulders" (11/05/2017)  . Chicken pox   . Childhood asthma   . Chronic pain   . Depression   . DKA (diabetic ketoacidoses) ([place]) 11/18/2014  . DKA (diabetic ketoacidoses) ([place]) 07/05/2017  . GERD (gastroesophageal reflux disease)   . Hypertension   . Migraine    "a few/year" (06/06/2017)  . Noncompliance with medication regimen   . Pneumonia    "several times" (06/06/2017)  . Polysubstance abuse ([place])   . Schizo affective schizophrenia ([place])   . Scoliosis   . Type I diabetes mellitus ([place])     Past Surgical History:  Procedure Laterality Date  . CARDIAC CATHETERIZATION  11/2016  . INCISION AND DRAINAGE ABSCESS Left 11/2011   "MRSA removed off my thumb"    Family History  Problem Relation Age of Onset  . Diabetes Mother     Social History:  reports that he has been smoking cigarettes.  He has a 4.50 pack-year smoking history. he has never used smokeless tobacco. He reports that he uses drugs. Drug: Marijuana. He reports that he does not drink alcohol.  Allergies:  Allergies  Allergen Reactions  . Sulfa Antibiotics Other (See Comments)    Unknown childhood allergy  . Fentanyl Other (See Comments)    Patient had hallucinations with the  medication    Prior to Admission medications   Medication Sig Start Date End Date Taking? Authorizing Provider  amphetamine-dextroamphetamine (ADDERALL) 30 MG tablet Take 30 mg daily by mouth. 10/02/17  Yes [provider]  insulin aspart (NOVOLOG) 100 UNIT/ML injection Take 6 units three times daily just before meals. 01/17/18 01/17/19 Yes Georgette Shell, MD  insulin detemir (LEVEMIR) 100 UNIT/ML injection Inject 0.18 mLs (18 Units total) into the skin 2 (two) times daily. 01/17/18  Yes Georgette Shell, MD  traZODone (DESYREL) 150 MG tablet Take 150 mg by mouth at bedtime. 06/26/17  Yes [provider]  doxepin (SINEQUAN) 10 MG/ML solution Take 10 mg by mouth at bedtime.  07/11/17   [provider]  insulin aspart (NOVOLOG FLEXPEN) 100 UNIT/ML FlexPen Inject 0-15 Units into the skin See admin instructions. Check Blood Sugar 4 times per day > with meals and at bedtime CBG 70 - 120: 0 units CBG 121 - 150: 2 units CBG 151 - 200: 3 units CBG 201 - 250: 5 units CBG 251 - 300: 8 units CBG 301 - 350: 11 units CBG 351 - 400: 15 units 12/12/17   Rai, Ripudeep K, MD  metoCLOPramide (REGLAN) 10 MG tablet Take 1 tablet (10 mg total) by mouth 3 (three) times daily as needed for nausea or vomiting. 12/12/17   Rai, Vernelle Emerald, MD  mupirocin cream (BACTROBAN) 2 % Apply topically daily. Apply Bactroban  to left heel wound Q day, then cover with foam dressing.  (Change foam dressing Q 3 days or PRN soiling.) 12/12/17   Rai, Ripudeep K, MD  nicotine (NICODERM CQ - DOSED IN MG/24 HOURS) 14 mg/24hr patch Place 1 patch (14 mg total) onto the skin daily. 12/06/17   Mikhail, Velta Addison, DO  pantoprazole (PROTONIX) 40 MG tablet Take 1 tablet (40 mg total) by mouth 2 (two) times daily before a meal. 12/12/17   Rai, Ripudeep K, MD  sennosides-docusate sodium (SENOKOT-S) 8.6-50 MG tablet Take 1 tablet by mouth as needed for constipation.    [provider]  sertraline (ZOLOFT) 50 MG  tablet Take 50 mg by mouth daily. For depression. Just started med, have not picked up yet [facility] [address] 02/22/12 03/08/12  Greig Castilla, FNP    Medications:  I have reviewed the patient's current medications. Scheduled: . amoxicillin-clavulanate  1 tablet Oral Q12H  . chlorhexidine  15 mL Mouth Rinse BID  . Chlorhexidine Gluconate Cloth  6 each Topical Daily  . doxepin  10 mg Oral QHS  . feeding supplement (GLUCERNA SHAKE)  237 mL Oral BID BM  . folic acid  1 mg Oral Daily  . Gerhardt's butt cream   Topical BID  . heparin injection (subcutaneous)  5,000 Units Subcutaneous Q8H  . insulin aspart  0-5 Units Subcutaneous QHS  . insulin aspart  0-9 Units Subcutaneous TID WC  . insulin aspart  3 Units Subcutaneous TID WC  . insulin detemir  12 Units Subcutaneous BID  . mouth rinse  15 mL Mouth Rinse q12n4p  . multivitamin with minerals  1 tablet Oral Daily  . nicotine  21 mg Transdermal Q24H  . pantoprazole  40 mg Oral Daily  . potassium chloride  50 mEq Oral Daily  . sodium chloride flush  10-40 mL Intracatheter Q12H  . traZODone  150 mg Oral QHS   Continuous:   Results for orders placed or performed during the hospital encounter of 01/20/18 (from the past 48 hour(s))  Glucose, capillary     Status: Abnormal   Collection Time: 01/26/18  2:31 PM  Result Value Ref Range   Glucose-Capillary 45 (L) 65 - 99 mg/dL  Glucose, capillary     Status: None   Collection Time: 01/26/18  3:50 PM  Result Value Ref Range   Glucose-Capillary 89 65 - 99 mg/dL  Glucose, capillary     Status: None   Collection Time: 01/26/18  5:30 PM  Result Value Ref Range   Glucose-Capillary 98 65 - 99 mg/dL  Glucose, capillary     Status: Abnormal   Collection Time: 01/26/18  9:31 PM  Result Value Ref Range   Glucose-Capillary 155 (H) 65 - 99 mg/dL  Basic metabolic panel     Status: Abnormal   Collection Time: 01/27/18  2:15 AM  Result Value Ref Range   Sodium 140 135 - 145 mmol/L   Potassium  3.9 3.5 - 5.1 mmol/L   Chloride 103 101 - 111 mmol/L   CO2 26 22 - 32 mmol/L   Glucose, Bld 47 (L) 65 - 99 mg/dL   BUN 11 6 - 20 mg/dL   Creatinine, Ser 0.87 0.61 - 1.24 mg/dL   Calcium 8.4 (L) 8.9 - 10.3 mg/dL   GFR calc non Af Amer >60 >60 mL/min   GFR calc Af Amer >60 >60 mL/min    Comment: (NOTE) The eGFR has been calculated using the CKD EPI equation. This calculation  has not been validated in all clinical situations. eGFR's persistently <60 mL/min signify possible Chronic Kidney Disease.    Anion gap 11 5 - 15    Comment: Performed at [facility] [address], [place], [place] [address]  CBC     Status: Abnormal   Collection Time: 01/27/18  2:15 AM  Result Value Ref Range   WBC 5.3 4.0 - 10.5 K/uL   RBC 2.94 (L) 4.22 - 5.81 MIL/uL   Hemoglobin 8.7 (L) 13.0 - 17.0 g/dL   HCT 26.5 (L) 39.0 - 52.0 %   MCV 90.1 78.0 - 100.0 fL   MCH 29.6 26.0 - 34.0 pg   MCHC 32.8 30.0 - 36.0 g/dL   RDW 14.1 11.5 - 15.5 %   Platelets 217 150 - 400 K/uL    Comment: Performed at [facility], [place] [address] [place], [place] [address]  Glucose, capillary     Status: Abnormal   Collection Time: 01/27/18  2:16 AM  Result Value Ref Range   Glucose-Capillary 44 (LL) 65 - 99 mg/dL  Glucose, capillary     Status: Abnormal   Collection Time: 01/27/18  2:45 AM  Result Value Ref Range   Glucose-Capillary 135 (H) 65 - 99 mg/dL  Glucose, capillary     Status: Abnormal   Collection Time: 01/27/18  8:03 AM  Result Value Ref Range   Glucose-Capillary 123 (H) 65 - 99 mg/dL  Glucose, capillary     Status: Abnormal   Collection Time: 01/27/18 12:12 PM  Result Value Ref Range   Glucose-Capillary 144 (H) 65 - 99 mg/dL  Glucose, capillary     Status: Abnormal   Collection Time: 01/27/18  4:57 PM  Result Value Ref Range   Glucose-Capillary 61 (L) 65 - 99 mg/dL  Glucose, capillary     Status: None   Collection Time: 01/27/18  6:00 PM  Result Value Ref Range   Glucose-Capillary 96 65 -  99 mg/dL  Glucose, capillary     Status: Abnormal   Collection Time: 01/27/18 10:01 PM  Result Value Ref Range   Glucose-Capillary 255 (H) 65 - 99 mg/dL  Glucose, capillary     Status: Abnormal   Collection Time: 01/28/18  1:26 AM  Result Value Ref Range   Glucose-Capillary 248 (H) 65 - 99 mg/dL  Basic metabolic panel     Status: Abnormal   Collection Time: 01/28/18  4:40 AM  Result Value Ref Range   Sodium 143 135 - 145 mmol/L   Potassium 4.4 3.5 - 5.1 mmol/L   Chloride 104 101 - 111 mmol/L   CO2 27 22 - 32 mmol/L   Glucose, Bld 153 (H) 65 - 99 mg/dL   BUN 13 6 - 20 mg/dL   Creatinine, Ser 1.04 0.61 - 1.24 mg/dL   Calcium 8.0 (L) 8.9 - 10.3 mg/dL   GFR calc non Af Amer >60 >60 mL/min   GFR calc Af Amer >60 >60 mL/min    Comment: (NOTE) The eGFR has been calculated using the CKD EPI equation. This calculation has not been validated in all clinical situations. eGFR's persistently <60 mL/min signify possible Chronic Kidney Disease.    Anion gap 12 5 - 15    Comment: Performed at [facility] [address], [place], [place] [address]  CBC     Status: Abnormal   Collection Time: 01/28/18  4:40 AM  Result Value Ref Range   WBC 4.1 4.0 - 10.5  K/uL   RBC 2.53 (L) 4.22 - 5.81 MIL/uL   Hemoglobin 7.2 (L) 13.0 - 17.0 g/dL   HCT 22.7 (L) 39.0 - 52.0 %   MCV 89.7 78.0 - 100.0 fL   MCH 28.5 26.0 - 34.0 pg   MCHC 31.7 30.0 - 36.0 g/dL   RDW 14.2 11.5 - 15.5 %   Platelets 170 150 - 400 K/uL    Comment: Performed at [facility] [address], [place], [place] [address]  Glucose, capillary     Status: Abnormal   Collection Time: 01/28/18  4:57 AM  Result Value Ref Range   Glucose-Capillary 135 (H) 65 - 99 mg/dL  Glucose, capillary     Status: Abnormal   Collection Time: 01/28/18  7:33 AM  Result Value Ref Range   Glucose-Capillary 110 (H) 65 - 99 mg/dL  Glucose, capillary     Status: Abnormal   Collection Time: 01/28/18  9:04 AM  Result Value Ref Range    Glucose-Capillary 129 (H) 65 - 99 mg/dL  Glucose, capillary     Status: Abnormal   Collection Time: 01/28/18 11:53 AM  Result Value Ref Range   Glucose-Capillary 166 (H) 65 - 99 mg/dL   REVIEW OF SYSTEMS: Reviewed with the patient as per History of present illness and PMH.  Blood pressure 129/60, pulse (!) 111, temperature 98.1 F (36.7 C), temperature source Oral, resp. rate 15, height 6' (1.829 m), weight 65.3 kg (144 lb), SpO2 96 %. General appearance: alert, cooperative and cachectic Head: Normocephalic, without obvious abnormality, atraumatic Eyes: conjunctivae/corneas clear. PERRL, EOM's intact.  Ears: normal TM's and external ear canals both ears Nose: Nares normal. Septum midline. Mucosa normal. No drainage or sinus tenderness. Throat: lips, mucosa, and tongue normal; teeth and gums normal Neck: no adenopathy, no carotid bruit, no JVD, supple, symmetrical, trachea midline and thyroid not enlarged, symmetric, no tenderness/mass/nodules Resp: Nonlabor. No stridor. Neurologic: Grossly normal.  Procedure:  Flexible Fiberoptic Laryngoscopy Anesthesia: Topical oxymetazoline and lidocaine Indication: Persistent hoarseness. Description: Risks, benefits, and alternatives of flexible endoscopy were explained to the patient. Specific mention was made of the risk of throat numbness with difficulty swallowing, possible bleeding from the nose and mouth, and pain from the procedure.  The patient gave oral consent to proceed.  The nasal cavities were decongested and anesthetised with a combination of oxymetazoline and 4% lidocaine solution.  The flexible scope was inserted into the right nasal cavity and advanced towards the nasopharynx.  Visualized mucosa over the turbinates and septum were normal.  The nasopharynx was clear.  Oropharyngeal walls were symmetric and mobile without lesion, mass, or edema.  Hypopharynx was also without  lesion or edema.  Larynx was mobile without lesions.  No lesions  or asymmetry in the supraglottic larynx.  Arytenoid mucosa was moderately edematous.  Posterior commissure with edema and redundant mucosa.  The right vocal cord was noted to be edematous and ecchymotic, likely secondary to previous trauma. Both vocal cords were mobile.  Assessment/Plan: The patient is noted to have an edematous and ecchymotic right vocal cord. This is likely secondary to intubation trauma. Both vocal cords are noted to be mobile.  - The patient also has moderate posterior laryngeal edema, consistent with his history of gastroesophageal reflux and laryngopharyngeal reflux. No other suspicious mass or lesion is noted today. - The patient should continue with pantoprazole daily. - Voice rest is encouraged. - His hoarseness will likely improve with time. - The patient may follow-up with me  as an outpatient after discharge.  Donivin Wirt W Darshay Deupree 01/28/2018, 1:18 PM

## 2018-01-28 NOTE — Anesthesia Procedure Notes (Signed)
Procedure Name: MAC Date/Time: 01/28/2018 8:23 AM Performed by: Lance Coon, CRNA Pre-anesthesia Checklist: Patient identified, Emergency Drugs available, Suction available, Patient being monitored and Timeout performed Patient Re-evaluated:Patient Re-evaluated prior to induction Oxygen Delivery Method: Nasal cannula

## 2018-01-28 NOTE — Anesthesia Postprocedure Evaluation (Signed)
Anesthesia Post Note  Patient: [name]  Procedure(s) Performed: TRANSESOPHAGEAL ECHOCARDIOGRAM (TEE) (N/A )     Patient location during evaluation: PACU Anesthesia Type: MAC Level of consciousness: awake and alert Pain management: pain level controlled Vital Signs Assessment: post-procedure vital signs reviewed and stable Respiratory status: spontaneous breathing, nonlabored ventilation and respiratory function stable Cardiovascular status: stable and blood pressure returned to baseline Postop Assessment: no apparent nausea or vomiting Anesthetic complications: no    Last Vitals:  Vitals:   01/28/18 0735 01/28/18 0842  BP: (!) 142/89 129/60  Pulse:    Resp: (!) 30 15  Temp: 36.7 C 36.7 C  SpO2: 98% 96%    Last Pain:  Vitals:   01/28/18 0842  TempSrc: Oral  PainSc:                  Danny Yackley,W. EDMOND

## 2018-01-28 NOTE — Progress Notes (Signed)
Subjective: No new complaints. Doing well this AM. He is a little disappointed nobody has looked at his throat. He states that he usually has a great voice but now its raspy. He is requesting a diet at this point. From an infection standpoint we will continue his antibiotics. All questions and concerns addressed.  Antibiotics:  Anti-infectives (From admission, onward)   Start     Dose/Rate Route Frequency Ordered Stop   01/25/18 1400  Ampicillin-Sulbactam (UNASYN) 3 g in sodium chloride 0.9 % 100 mL IVPB     3 g 200 mL/hr over 30 Minutes Intravenous Every 6 hours 01/25/18 1258     01/22/18 0945  vancomycin (VANCOCIN) IVPB 1000 mg/200 mL premix  Status:  Discontinued     1,000 mg 200 mL/hr over 60 Minutes Intravenous Every 24 hours 01/21/18 0938 01/22/18 1050   01/21/18 1000  piperacillin-tazobactam (ZOSYN) IVPB 3.375 g  Status:  Discontinued     3.375 g 12.5 mL/hr over 240 Minutes Intravenous Every 8 hours 01/21/18 0938 01/22/18 1050   01/21/18 0945  vancomycin (VANCOCIN) 1,250 mg in sodium chloride 0.9 % 250 mL IVPB     1,250 mg 166.7 mL/hr over 90 Minutes Intravenous  Once 01/21/18 0938 01/21/18 1313   01/20/18 1930  cefTRIAXone (ROCEPHIN) 1 g in dextrose 5 % 50 mL IVPB     1 g 100 mL/hr over 30 Minutes Intravenous  Once 01/20/18 1915 01/20/18 2000     Medications: Scheduled Meds: . chlorhexidine  15 mL Mouth Rinse BID  . Chlorhexidine Gluconate Cloth  6 each Topical Daily  . doxepin  10 mg Oral QHS  . feeding supplement (GLUCERNA SHAKE)  237 mL Oral BID BM  . folic acid  1 mg Oral Daily  . Gerhardt's butt cream   Topical BID  . heparin injection (subcutaneous)  5,000 Units Subcutaneous Q8H  . insulin aspart  0-5 Units Subcutaneous QHS  . insulin aspart  0-9 Units Subcutaneous TID WC  . insulin aspart  3 Units Subcutaneous TID WC  . insulin detemir  12 Units Subcutaneous BID  . mouth rinse  15 mL Mouth Rinse q12n4p  . multivitamin with minerals  1 tablet Oral  Daily  . nicotine  21 mg Transdermal Q24H  . pantoprazole  40 mg Oral Daily  . potassium chloride  50 mEq Oral Daily  . sodium chloride flush  10-40 mL Intracatheter Q12H  . traZODone  150 mg Oral QHS   Continuous Infusions: . ampicillin-sulbactam (UNASYN) IV Stopped (01/28/18 0148)   PRN Meds:.acetaminophen, dextrose, gi cocktail, haloperidol lactate, ondansetron (ZOFRAN) IV, phenol, sodium chloride flush, sodium chloride flush, traMADol  Objective: Weight change:   Intake/Output Summary (Last 24 hours) at 01/28/2018 0928 Last data filed at 01/28/2018 0838 Gross per 24 hour  Intake 760 ml  Output -  Net 760 ml   Blood pressure 129/60, pulse (!) 111, temperature 98.1 F (36.7 C), temperature source Oral, resp. rate 15, height 6' (1.829 m), weight 144 lb (65.3 kg), SpO2 96 %. Temp:  [98.1 F (36.7 C)-98.7 F (37.1 C)] 98.1 F (36.7 C) (02/11 0842) Pulse Rate:  [111-115] 111 (02/10 2202) Resp:  [15-30] 15 (02/11 0842) BP: (123-142)/(60-89) 129/60 (02/11 0842) SpO2:  [96 %-100 %] 96 % (02/11 0842) Weight:  [144 lb (65.3 kg)] 144 lb (65.3 kg) (02/11 0735)  Physical Exam: General: Alert and awake, oriented x3, not in any acute distress. HEENT: Anicteric sclera, pupils reactive to light and  accommodation, EOMI CVS: Regular rate, normal r, no murmur rubs or gallops Chest: Clear to auscultation bilaterally, no wheezing, rales or rhonchi Abdomen: Soft nontender, nondistended, normal bowel sounds, Extremities: No clubbing or edema noted bilaterally Skin: No rashes Lymph: No new lymphadenopathy Neuro: Nonfocal  CBC: @LABBLAST3 (wbc3,Hgb:3,Hct:3,Plt:3,INR:3APTT:3)@  BMET Recent Labs    01/27/18 0215 01/28/18 0440  NA 140 143  K 3.9 4.4  CL 103 104  CO2 26 27  GLUCOSE 47* 153*  BUN 11 13  CREATININE 0.87 1.04  CALCIUM 8.4* 8.0*   Liver Panel  No results for input(s): PROT, ALBUMIN, AST, ALT, ALKPHOS, BILITOT, BILIDIR, IBILI in the last 72 hours.  Sedimentation  Rate Recent Labs    01/26/18 0157  ESRSEDRATE 77*   C-Reactive Protein Recent Labs    01/26/18 0157  CRP 2.9*   Micro Results: Recent Results (from the past 720 hour(s))  Blood culture (routine x 2)     Status: None   Collection Time: 01/14/18  6:44 PM  Result Value Ref Range Status   Specimen Description   Final    BLOOD RIGHT ANTECUBITAL Performed at [facility], [address] [place], [place]    Special Requests   Final    BOTTLES DRAWN AEROBIC AND ANAEROBIC Blood Culture adequate volume Performed at [facility], [address] [place], [place]    Culture   Final    NO GROWTH 5 DAYS Performed at [facility], [address], [place], [place]    Report Status 01/19/2018 FINAL  Final  Blood culture (routine x 2)     Status: None   Collection Time: 01/14/18 11:38 PM  Result Value Ref Range Status   Specimen Description   Final    BLOOD Performed at [facility], [address], [place], [place]    Special Requests IN PEDIATRIC BOTTLE  Final   Culture   Final    NO GROWTH 5 DAYS Performed at [facility], [address] [place], [place]    Report Status 01/20/2018 FINAL  Final  MRSA PCR Screening     Status: None   Collection Time: 01/15/18  8:21 AM  Result Value Ref Range Status   MRSA by PCR NEGATIVE NEGATIVE Final    Comment:        The GeneXpert MRSA Assay (FDA approved for NASAL specimens only), is one component of a comprehensive MRSA colonization surveillance program. It is not intended to diagnose MRSA infection nor to guide or monitor treatment for MRSA infections.   Blood culture (routine x 2)     Status: None   Collection Time: 01/20/18  7:25 PM  Result Value Ref Range Status   Specimen Description BLOOD LEFT WRIST  Final   Special Requests   Final    BOTTLES DRAWN AEROBIC ONLY Blood Culture adequate volume   Culture    Final    NO GROWTH 5 DAYS Performed at [facility], [address] [place], [place]    Report Status 01/25/2018 FINAL  Final  Blood culture (routine x 2)     Status: None   Collection Time: 01/20/18  7:30 PM  Result Value Ref Range Status   Specimen Description BLOOD RIGHT WRIST  Final   Special Requests   Final    BOTTLES DRAWN AEROBIC ONLY Blood Culture adequate volume   Culture   Final    NO  GROWTH 5 DAYS Performed at [facility], [address], [place], [place] [address]    Report Status 01/25/2018 FINAL  Final  Culture, blood (routine x 2)     Status: None (Preliminary result)   Collection Time: 01/24/18 10:02 PM  Result Value Ref Range Status   Specimen Description BLOOD RIGHT ANTECUBITAL  Final   Special Requests AEROBIC BOTTLE ONLY Blood Culture adequate volume  Final   Culture   Final    NO GROWTH 2 DAYS Performed at [facility], [address], [place], [place] [address]    Report Status PENDING  Incomplete  Culture, blood (routine x 2)     Status: None (Preliminary result)   Collection Time: 01/24/18 10:12 PM  Result Value Ref Range Status   Specimen Description BLOOD RIGHT HAND  Final   Special Requests AEROBIC BOTTLE ONLY Blood Culture adequate volume  Final   Culture   Final    NO GROWTH 2 DAYS Performed at [facility], [address] [place], [place] [address]    Report Status PENDING  Incomplete   Studies/Results: No results found.  Assessment/Plan:  INTERVAL HISTORY:  - Afebrile and hemodynamically stable  - Left foot x-ray with not foreign body or signs of osteomyelitis  - Leukocytosis resolved  - TEE preliminary reading without signs of endocarditis  ASSESSMENT:  [name] is a 32 y.o. male admitted for DKA and was susbequently intubated. Shortly after extubation he began to experience pleuritic chest pain, CTA illustrated multiple pulmonary nodules. The source his likely oral given the patient's  poor dentition. TEE preformed today showing no signs of endocarditis (preliminary reading).    - Transition to Augmentin for a total of 14 days  - Patient ultimately needs to be evaluated by a dentist. Primary team is working to contact Dr. Kristin Bruins per their notes.  - Will sign off, available as needed.    LOS: 8 days   [facility] 01/28/2018, 9:28 AM

## 2018-01-28 NOTE — Interval H&P Note (Signed)
History and Physical Interval Note:  01/28/2018 8:08 AM  [name]  has presented today for surgery, with the diagnosis of BACTEREMIA  The various methods of treatment have been discussed with the patient and family. After consideration of risks, benefits and other options for treatment, the patient has consented to  Procedure(s): TRANSESOPHAGEAL ECHOCARDIOGRAM (TEE) (N/A) as a surgical intervention .  The patient's history has been reviewed, patient examined, no change in status, stable for surgery.  I have reviewed the patient's chart and labs.  Questions were answered to the patient's satisfaction.     [employer]

## 2018-01-28 NOTE — Plan of Care (Signed)
Progressing

## 2018-01-28 NOTE — CV Procedure (Signed)
   TEE  Bacteremia  Time out performed  Anesthesia - propofol  Findings:   - No vegetations.   - Normal EF  - Trace MR  - No thrombus  Reassuring  Donato Schultz, MD

## 2018-01-28 NOTE — Consult Note (Signed)
DENTAL CONSULTATION  Date of Consultation:  01/28/2018 Patient Name:   [name] Date of Birth:   1986-02-21 Medical Record Number: [id number]  VITALS: BP 129/60   Pulse (!) 111   Temp 98.1 F (36.7 C) (Oral)   Resp 15   Ht 6' (1.829 m)   Wt 144 lb (65.3 kg)   SpO2 96%   BMI 19.53 kg/m   CHIEF COMPLAINT: The patient was referred by [facility] for a dental consultation.  HPI: [name] is a 32 year old male recently admitted with altered mental status.  Patient subsequently found to have poor dentition. A dental consultation was then requested to evaluate poor dentition that may affect the patient's systemic health.  The patient currently denies acute toothaches, swellings, or abscesses. Patient was last seen by dentist in [place] for an exam and cleaning 5-6 years ago. Patient has been in [place] for approximately one year. Patient denies having any partial dentures. Patient denies having dental phobia. Patient is interested in getting "all my teeth out".  PROBLEM LIST: Patient Active Problem List   Diagnosis Date Noted  . Ankle ulcer ([name])   . Dental abscess   . Lung nodules   . Altered mental status   . Pressure injury of skin 01/22/2018  . Endotracheally intubated   . Increased anion gap metabolic acidosis   . Metabolic encephalopathy   . Vision loss 12/10/2017  . DKA (diabetic ketoacidosis) ([name]) 11/03/2017  . Pressure injury of contiguous region involving back and buttock, stage 1 10/11/2017  . Encephalopathy acute   . Hyperbilirubinemia 09/14/2017  . Heel ulcer ([name]) 09/14/2017  . Noncompliance with medication regimen   . Hypokalemia   . Non healing left heel wound   . Cellulitis   . DKA, type 1 ([name]) 08/17/2017  . Encounter for imaging study to confirm orogastric (OG) tube placement   . Acute respiratory acidosis   . DKA (diabetic ketoacidoses) ([name]) 08/04/2017  . Respiratory failure ([name])   . Seizure ([name])   . Diabetic  ketoacidosis ([name]) 07/30/2017  . Normocytic anemia 07/05/2017  . Numbness of right hand 07/05/2017  . Elevated troponin I measurement   . Positive D dimer   . DKA, type 1, not at goal [facility]) 07/01/2017  . Hyperkalemia   . Acute kidney injury ([name])   . Medically noncompliant   . Hyperglycemia 06/16/2017  . Nausea & vomiting 06/15/2017  . Abdominal pain 06/15/2017  . Sepsis ([name]) 06/07/2017  . Chest pain 06/07/2017  . GERD (gastroesophageal reflux disease) 06/07/2017  . Substance abuse ([name]) 05/30/2016  . Substance induced mood disorder ([name]) 05/30/2016  . Tobacco use disorder 12/24/2015  . DM hyperosmolarity type I, uncontrolled ([name]) 12/19/2015  . Generalized headache 07/22/2015  . Bilateral hip bursitis 05/26/2015  . Depression   . Generalized anxiety disorder 03/29/2015  . Undifferentiated schizophrenia ([name])   . Hip pain, bilateral 11/18/2014  . Dehydration 06/17/2012    PMH: Past Medical History:  Diagnosis Date  . Anemia   . Anxiety   . Arthritis    "both hips; both shoulders" (11/05/2017)  . Chicken pox   . Childhood asthma   . Chronic pain   . Depression   . DKA (diabetic ketoacidoses) ([name]) 11/18/2014  . DKA (diabetic ketoacidoses) ([name]) 07/05/2017  . GERD (gastroesophageal reflux disease)   . Hypertension   . Migraine    "a few/year" (06/06/2017)  . Noncompliance with medication regimen   . Pneumonia    "several times" (  06/06/2017)  . Polysubstance abuse ([name])   . Schizo affective schizophrenia ([name])   . Scoliosis   . Type I diabetes mellitus ([name])     PSH: Past Surgical History:  Procedure Laterality Date  . CARDIAC CATHETERIZATION  11/2016  . INCISION AND DRAINAGE ABSCESS Left 11/2011   "MRSA removed off my thumb"    ALLERGIES: Allergies  Allergen Reactions  . Sulfa Antibiotics Other (See Comments)    Unknown childhood allergy  . Fentanyl Other (See Comments)    Patient had hallucinations with the medication    MEDICATIONS: Current  Facility-Administered Medications  Medication Dose Route Frequency Provider Last Rate Last Dose  . acetaminophen (TYLENOL) tablet 650 mg  650 mg Oral Q6H PRN Briant Cedar, MD      . amoxicillin-clavulanate (AUGMENTIN) 875-125 MG per tablet 1 tablet  1 tablet Oral Q12H Helberg, Justin, MD      . butalbital-acetaminophen-caffeine (FIORICET, ESGIC) [phone] MG per tablet 1 tablet  1 tablet Oral Q6H PRN Briant Cedar, MD   1 tablet at 01/28/18 1132  . chlorhexidine (PERIDEX) 0.12 % solution 15 mL  15 mL Mouth Rinse BID Max Fickle B, MD   15 mL at 01/28/18 0947  . Chlorhexidine Gluconate Cloth 2 % PADS 6 each  6 each Topical Daily Lupita Leash, MD   6 each at 01/27/18 1016  . dextrose 50 % solution 25 g  25 g Intravenous Once PRN Briant Cedar, MD      . doxepin (SINEQUAN) 10 MG/ML solution 10 mg  10 mg Oral QHS Drema Dallas, MD   10 mg at 01/27/18 2236  . feeding supplement (GLUCERNA SHAKE) (GLUCERNA SHAKE) liquid 237 mL  237 mL Oral BID BM Briant Cedar, MD   237 mL at 01/28/18 1016  . folic acid (FOLVITE) tablet 1 mg  1 mg Oral Daily Briant Cedar, MD   1 mg at 01/28/18 0947  . Gerhardt's butt cream   Topical BID Briant Cedar, MD      . gi cocktail (Maalox,Lidocaine,Donnatal)  30 mL Oral TID PRN Briant Cedar, MD   30 mL at 01/26/18 2144  . haloperidol lactate (HALDOL) injection 2 mg  2 mg Intravenous Q6H PRN Briant Cedar, MD      . heparin injection 5,000 Units  5,000 Units Subcutaneous Q8H Della Goo, [place]   5,000 Units at 01/28/18 0502  . insulin aspart (novoLOG) injection 0-5 Units  0-5 Units Subcutaneous QHS Lupita Leash, MD   3 Units at 01/27/18 2237  . insulin aspart (novoLOG) injection 0-9 Units  0-9 Units Subcutaneous TID WC Briant Cedar, MD   1 Units at 01/28/18 [phone]  . insulin aspart (novoLOG) injection 3 Units  3 Units Subcutaneous TID WC Briant Cedar, MD   3 Units at 01/28/18 [phone]  .  insulin detemir (LEVEMIR) injection 12 Units  12 Units Subcutaneous BID Briant Cedar, MD   12 Units at 01/28/18 [phone]  . MEDLINE mouth rinse  15 mL Mouth Rinse q12n4p Max Fickle B, MD   15 mL at 01/27/18 1633  . multivitamin with minerals tablet 1 tablet  1 tablet Oral Daily Briant Cedar, MD   1 tablet at 01/28/18 0947  . nicotine (NICODERM CQ - dosed in mg/24 hours) patch 21 mg  21 mg Transdermal Q24H Briant Cedar, MD   21 mg at 01/27/18 1757  . ondansetron (ZOFRAN) injection 4 mg  4  mg Intravenous Q8H PRN Briant Cedar, MD   4 mg at 01/27/18 1013  . pantoprazole (PROTONIX) EC tablet 40 mg  40 mg Oral Daily Briant Cedar, MD   40 mg at 01/27/18 2128  . phenol (CHLORASEPTIC) mouth spray 1 spray  1 spray Mouth/Throat PRN Cheron Schaumann K, PA-C      . potassium chloride 20 MEQ/15ML (10%) solution 50 mEq  50 mEq Oral Daily Drema Dallas, MD   50 mEq at 01/28/18 0947  . sodium chloride flush (NS) 0.9 % injection 10-40 mL  10-40 mL Intracatheter PRN Blane Ohara, MD      . sodium chloride flush (NS) 0.9 % injection 10-40 mL  10-40 mL Intracatheter Q12H Max Fickle B, MD   10 mL at 01/25/18 1057  . sodium chloride flush (NS) 0.9 % injection 10-40 mL  10-40 mL Intracatheter PRN Max Fickle B, MD      . traMADol (ULTRAM) tablet 50 mg  50 mg Oral Q6H PRN Briant Cedar, MD   50 mg at 01/27/18 2351  . traZODone (DESYREL) tablet 150 mg  150 mg Oral QHS Drema Dallas, MD   150 mg at 01/27/18 2236  . Zinc Oxide (TRIPLE PASTE) 12.8 % ointment   Topical PRN Briant Cedar, MD        LABS: Lab Results  Component Value Date   WBC 4.1 01/28/2018   HGB 7.2 (L) 01/28/2018   HCT 22.7 (L) 01/28/2018   MCV 89.7 01/28/2018   PLT 170 01/28/2018      Component Value Date/Time   NA 143 01/28/2018 0440   K 4.4 01/28/2018 0440   CL 104 01/28/2018 0440   CO2 27 01/28/2018 0440   GLUCOSE 153 (H) 01/28/2018 0440   BUN 13 01/28/2018 0440    CREATININE 1.04 01/28/2018 0440   CALCIUM 8.0 (L) 01/28/2018 0440   GFRNONAA >60 01/28/2018 0440   GFRAA >60 01/28/2018 0440   Lab Results  Component Value Date   INR 1.28 01/14/2018   INR 1.38 08/04/2017   INR 1.49 07/01/2017   No results found for: PTT  SOCIAL HISTORY: Social History   Socioeconomic History  . Marital status: Legally Separated    Spouse name: Not on file  . Number of children: 0  . Years of education: 65  . Highest education level: Not on file  Social Needs  . Financial resource strain: Not on file  . Food insecurity - worry: Not on file  . Food insecurity - inability: Not on file  . Transportation needs - medical: Not on file  . Transportation needs - non-medical: Not on file  Occupational History  . Occupation: Disability  Tobacco Use  . Smoking status: Current Every Day Smoker    Packs/day: 0.50    Years: 9.00    Pack years: 4.50    Types: Cigarettes  . Smokeless tobacco: Never Used  Substance and Sexual Activity  . Alcohol use: No  . Drug use: Yes    Types: Marijuana    Comment: 11/05/2017 "none since 2012"; pt denies hx of cocaine and methamphetamines use on 11/05/2017  . Sexual activity: Not Currently  Other Topics Concern  . Not on file  Social History Narrative   ** Merged History Encounter **    Fun: Video games    FAMILY HISTORY: Family History  Problem Relation Age of Onset  . Diabetes Mother     REVIEW OF SYSTEMS: Reviewed with the patient as  per History of present illness. Psych: Patient denies having dental phobia.  DENTAL HISTORY: CHIEF COMPLAINT: The patient was referred by [facility] for a dental consultation.  HPI: [name] is a 32 year old male recently admitted with altered mental status.  Patient subsequently found to have poor dentition. A dental consultation was then requested to evaluate poor dentition that may affect the patient's systemic health.  The patient currently denies acute  toothaches, swellings, or abscesses. Patient was last seen by dentist in [place] for an exam and cleaning 5-6 years ago. Patient has been in [place] for approximately one year. Patient denies having any partial dentures. Patient denies having dental phobia. Patient is interested in getting "all my teeth out".  DENTAL EXAMINATION: GENERAL:  The patient is a tall, well-developed male in no acute distress. HEAD AND NECK:  There is no palpable neck lymphadenopathy. The patient denies acute TMJ symptoms. INTRAORAL EXAM:  The patient has normal saliva. There is no evidence of oral abscess formation. The patient has bilateral mandibular lingual tori. The patient has a deep palatal vault. DENTITION:  The patient has multiple missing teeth and multiple retained root segments. PERIODONTAL:  Patient has chronic periodontitis with plaque and calculus accumulations, gingival recession, and tooth mobility. There is incipient to severe bone loss noted. DENTAL CARIES/SUBOPTIMAL RESTORATIONS:  The patient has rampant dental caries affecting remaining dentition. ENDODONTIC:  Patient currently denies acute pulpitis symptoms. The patient does have multiple areas of periapical pathology and radiolucency. CROWN AND BRIDGE: There are no crown or bridge restorations. PROSTHODONTIC:  The patient denies having partial dentures. OCCLUSION:  The patient has a poor occlusal scheme secondary to multiple missing teeth, multiple retained root segments, and lack of replacement missing teeth with dental prostheses.  RADIOGRAPHIC INTERPRETATION: A suboptimal orthopantogram was taken with significant motion artifact. I will see if radiology is able to obtain a more diagnostic image. There are multiple missing teeth. There are multiple retained root segments. There are multiple areas of periapical pathology and radiolucency.  Rampant dental caries are noted. There is incipient to severe bone loss noted. There is supra-eruption and  drifting of the unopposed teeth into the edentulous areas.  ASSESSMENTS: 1. History of altered mental status and diabetic ketoacidosis 2.Chronic apical periodontitis 3. Retained root segments 4. Rampant dental caries 5. Chronic periodontitis of bone loss 6. Accretions 7. Gingival recession 8. Tooth mobility 9. Bilateral mandibular lingual tori 10. Multiple missing teeth 11. Supra-eruption and drifting of the unopposed teeth into the edentulous areas 12. Malocclusion 13. History of thrombocytopenia with risk for bleeding with invasive dental procedures 14. History of oral neglect   PLAN/RECOMMENDATIONS: 1. I discussed the risks, benefits, and complications of various treatment options with the patient in relationship to his medical and dental conditions. We discussed various treatment options to include no treatment, multiple extractions with alveoloplasty, pre-prosthetic surgery as indicated, periodontal therapy, dental restorations, root canal therapy, crown and bridge therapy, implant therapy, and replacement of missing teeth as indicated. The patient currently wishes to proceed with extraction of all remaining teeth with alveoloplasty and pre-prosthetic surgery as needed in the operating room with general anesthesia. The patient will then follow-up with a dentist of his choice for fabrication of upper and lower complete dentures after adequate healing.  The patient has been scheduled for OR procedure for this coming Thursday, 01/31/2018 at 11:30 AM.     2. Discussion of findings with medical team and coordination of future medical and dental care as needed.  Lenn Cal, DDS

## 2018-01-28 NOTE — Transfer of Care (Signed)
Immediate Anesthesia Transfer of Care Note  Patient: [name]  Procedure(s) Performed: TRANSESOPHAGEAL ECHOCARDIOGRAM (TEE) (N/A )  Patient Location: Endoscopy Unit  Anesthesia Type:MAC  Level of Consciousness: awake and patient cooperative  Airway & Oxygen Therapy: Patient Spontanous Breathing  Post-op Assessment: Report given to RN and Post -op Vital signs reviewed and stable  Post vital signs: Reviewed and stable  Last Vitals:  Vitals:   01/28/18 0735 01/28/18 0842  BP: (!) 142/89 129/60  Pulse:    Resp: (!) 30 15  Temp: 36.7 C 36.7 C  SpO2: 98% 96%    Last Pain:  Vitals:   01/28/18 0842  TempSrc: Oral  PainSc:       Patients Stated Pain Goal: 2 (14/97/02 6378)  Complications: No apparent anesthesia complications

## 2018-01-28 NOTE — Consult Note (Signed)
WOC Nurse wound consult note Reason for Consult: consulted for foot wound, however when I arrived the patient reports most problematic is the sacral wound. It is unclear how patient developed pressure injuries at home, he reports he is ambulatory but spends 15-18 hours per day in a chair. Unsure if his recent AMS resulted in him being sedentary for long periods of time Wound type: Deep Tissue Pressure Injury: sacrum: 6cm x 1cm x 0.1cm  Deep Tissue Pressure Injury: left heel: 2cm x 2cm x 0.1cm with partial thickness skin loss, bulla rupture Stage 2 Pressure Injury: left plantar surface area: 3cm x 3cm x 0.1cm, serous filled blister that has ruptured with roof intact  Pressure Injury POA: Yes Measurement:see above  Wound bed: Sacrum: dark purple, moist at wound edges where skin has peeled Left foot, intact but ruptured bulla roof, pink underneath Left heel: dark purple ruddy wound wound with partial thickness skin loss and evidence of roof of bulla loss Drainage (amount, consistency, odor) none from the foot or sacrum really, scant serosanguinous  Periwound: intact Dressing procedure/placement/frequency: Patient is requesting lidocaine cream, however with broken skin not sure I would do that. I have added triple paste for the sacrum along with foam dressing.  Chair pressure redistribution pad for offloading the site when he is up in the chair, which he spends a tremendous amount of time in the chair Silicone foam to the left heel and foot, change every 3 days and PRN soilage.  Maximize nutrition for wound healing.   Discussed POC with patient and bedside nurse.  Re consult if needed, will not follow at this time. Thanks  Adamariz Gillott [employer], RN,CWOCN, CNS, CWON-AP [phone])

## 2018-01-28 NOTE — Progress Notes (Signed)
PROGRESS NOTE  [name] QIO:[id number] DOB: [birth year] DOA: 01/20/2018 PCP: Fleet Contras, MD  HPI/Recap of past 40 hours: 32 year old male with PMHx of Asthma, Anemia, DM Type 1, GERD, HTN, Schizoaffective disorder, Depression with multiple admissions secondary to DKA 2/2 medical non-compliance. Pt presented to ED on 2/3 with recurrent DKA. Upon arrival patient is hypotensive, hyperglycemic, and unresponsive.Per EMS patient found with crushed pills around him. Roommate called EMS, however, unsure how long patient has been unresponsive.VBG with pH of 6.6. Glucose 1175. Started on Insulin and Bicarb gtt, given one liter of NS. Intubated for airway protection and admitted to the ICU. Pt was extubated remained stable and was transferred to [facility] on 01/23/18.  Today, patient reported feeling better, still c/o his sacral ulcer feeling sore, voice is still hoarse and still has the feeling of food stuck in his throat. Pt denies any chest pain, SOB, fever/chills.   Assessment/Plan: Principal Problem:   Altered mental status Active Problems:   DKA (diabetic ketoacidoses) ([name])   Respiratory failure ([name])   Endotracheally intubated   Increased anion gap metabolic acidosis   Pressure injury of skin   Ankle ulcer ([name])   Dental abscess   Lung nodules  Pulmonary nodular lesions likely due to diffuse septic embolism ?? Source from multiple dental caries Afebrile, with resolved leukocytosis LA 1.8, procalcitonin elevated at 3.04 BC X 2 done on 01/20/18, NGTD, repeat NGTD, HIV neg CT chest angio: showed pulm nodular lesion likely due to diffuse septic embolism ID consulted: rec TEE, no vegetations noted Changed from Unasyn to PO Augmentin for a total of 14 days Tramadol for pain  Dental caries Likely the culprit for ?diffuse septic emboli Orthopantogram showed dental caries in all remaining teeth Consulted Dr Kristin Bruins pager [phone] who plans to perform teeth  extraction on 01/31/18 at 11:30am, under gen anaesthesia and would prefer pt remain in hospital till that date due to social issues   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 Currently afebrile with no leukocytosis,, no signs of overt infection Foot xray showed: No acute abnormality noted Wound care team, diabetes coordinator on board  Non-cardiac chest pain Atypical Trop X 2, flat trend 0.04, no acute ST changes D-dimer elevated, CT angio as above Monitor closely  Acute respiratory failure with hypoxia  Resolved  Multifactorial metabolic encephalopathy, asthma  Diabetes Type 1 uncontrolled with complications/ DKA Fluctuating BS, with episodes of hypoglycemia-->brittle DM Adjust insulin regimen prn Levemir, Novolog TID with meals, SSI  Dysphagia/GERD Mod barium swallow done, rec regular food with thin liquids  ENT consulted due to hoarse voice, awaiting further recs  Hypovolemic shock Resolved Secondary to DKA/dehydration  Chronic Diastolic CHF 1/30 echocardiogram: LVEF 60-65%, -RIGHT ventricle moderately dilated Atrial septum:  increased thickness of the septum, consistent with lipomatous hypertrophy. Strict in and out, Daily weight  Essential HTN Stable  Acute kidney injury (baseline Cr<1)  Resolved Secondary to DKA/uncontrolled diabetes Continue hydration  Hypokalemia/Hypophosphatemia/Hypomagnesemia Replace prn, monitor daily  Normocytic anemia Mixed picture Folate low 3.3, iron 34, TIBC low, Vit B12 825 Start folic acid Occult blood pending Daily CBC   ??Attempted Suicide Patient found down with multiple crushed pills around him. Unfortunately upon admission UDS not obtained Psychiatric on board: continue doxepin, no further recs  Schizoaffective disorder Continue Trazodone 150 mg QHS, Doxepin 10 mg QHS Haldol IV 2 mg QID prn     Code Status: Full  Family Communication: None at bedside  Disposition Plan:  TBD   Consultants:  PCCM  Psychiatry  ID  Dentist  Procedures:  Intubation  Antimicrobials:  PO Augmnetin  DVT prophylaxis:  [name] Heparin   Objective: Vitals:   01/27/18 2202 01/28/18 0735 01/28/18 0842 01/28/18 1509  BP:  (!) 142/89 129/60 (!) 144/96  Pulse: (!) 111   97  Resp:  (!) 30 15 20   Temp:  98.1 F (36.7 C) 98.1 F (36.7 C) 98.9 F (37.2 C)  TempSrc:  Oral Oral Oral  SpO2:  98% 96% 97%  Weight:  65.3 kg (144 lb)    Height:  6' (1.829 m)      Intake/Output Summary (Last 24 hours) at 01/28/2018 1735 Last data filed at 01/28/2018 03/28/2018 Gross per 24 hour  Intake 400 ml  Output -  Net 400 ml   Filed Weights   01/28/18 0735  Weight: 65.3 kg (144 lb)    Exam:   General: Alert, awake, ill-appearing, cachetic  Cardiovascular: S1-S2 present, no added hrt sound  Respiratory: Chest clear bilaterally   Abdomen: Soft, non-tender, non-distended, BS present   Musculoskeletal: No bilateral lower extremity edema  Skin: Normal  Psychiatry: Normal mood   Data Reviewed: CBC: Recent Labs  Lab 01/24/18 1241 01/25/18 1600 01/26/18 0157 01/27/18 0215 01/28/18 0440  WBC 6.2 5.2 6.8 5.3 4.1  HGB 8.1* 7.5* 7.8* 8.7* 7.2*  HCT 24.1* 22.6* 23.7* 26.5* 22.7*  MCV 88.3 87.3 88.1 90.1 89.7  PLT 98* 122* 142* 217 170   Basic Metabolic Panel: Recent Labs  Lab 01/22/18 0357 01/23/18 0304 01/23/18 1505 01/24/18 1241 01/25/18 1600 01/26/18 0157 01/27/18 0215 01/28/18 0440  NA 149* 147*  --  145 138 140 140 143  K 4.1 3.4* 4.2 3.5 4.4 3.8 3.9 4.4  CL 121* 119*  --  112* 103 103 103 104  CO2 17* 19*  --  25 23 27 26 27   GLUCOSE 158* 141*  --  116* 154* 82 47* 153*  BUN 32* 19  --  11 15 12 11 13   CREATININE 1.93* 1.34*  --  1.00 1.02 1.11 0.87 1.04  CALCIUM 7.3* 7.7*  --  8.0* 7.9* 7.9* 8.4* 8.0*  MG 1.6*  --  1.5* 1.7 1.7 1.6*  --   --   PHOS 2.9 2.0*  --   --   --   --   --   --    GFR: Estimated Creatinine Clearance: 93.3 mL/min (by C-G  formula based on SCr of 1.04 mg/dL). Liver Function Tests: Recent Labs  Lab 01/23/18 0304  AST 45*  ALT 24  ALKPHOS 100  BILITOT 0.3  PROT 4.1*  ALBUMIN 2.1*  2.2*   Recent Labs  Lab 01/22/18 0900  LIPASE 35   No results for input(s): AMMONIA in the last 168 hours. Coagulation Profile: No results for input(s): INR, PROTIME in the last 168 hours. Cardiac Enzymes: Recent Labs  Lab 01/23/18 1505 01/23/18 1921 01/24/18 2143  TROPONINI 0.04* 0.04* <0.03   BNP (last 3 results) No results for input(s): PROBNP in the last 8760 hours. HbA1C: Recent Labs    01/26/18 0157  HGBA1C 9.8*   CBG: Recent Labs  Lab 01/28/18 0457 01/28/18 0733 01/28/18 0904 01/28/18 1153 01/28/18 1622  GLUCAP 135* 110* 129* 166* 177*   Lipid Profile: No results for input(s): CHOL, HDL, LDLCALC, TRIG, CHOLHDL, LDLDIRECT in the last 72 hours. Thyroid Function Tests: No results for input(s): TSH, T4TOTAL, FREET4, T3FREE, THYROIDAB in the last 72 hours. Anemia Panel:  No results for input(s): VITAMINB12, FOLATE, FERRITIN, TIBC, IRON, RETICCTPCT in the last 72 hours. Urine analysis:    Component Value Date/Time   COLORURINE STRAW (A) 01/25/2018 0609   APPEARANCEUR CLEAR 01/25/2018 0609   LABSPEC 1.010 01/25/2018 0609   PHURINE 7.0 01/25/2018 0609   GLUCOSEU 150 (A) 01/25/2018 0609   HGBUR NEGATIVE 01/25/2018 0609   BILIRUBINUR NEGATIVE 01/25/2018 0609   BILIRUBINUR negative 03/29/2015 1114   KETONESUR NEGATIVE 01/25/2018 0609   PROTEINUR NEGATIVE 01/25/2018 0609   UROBILINOGEN 0.2 05/17/2015 1810   NITRITE NEGATIVE 01/25/2018 0609   LEUKOCYTESUR NEGATIVE 01/25/2018 0609   Sepsis Labs: @LABRCNTIP (procalcitonin:4,lacticidven:4)  ) Recent Results (from the past 240 hour(s))  Blood culture (routine x 2)     Status: None   Collection Time: 01/20/18  7:25 PM  Result Value Ref Range Status   Specimen Description BLOOD LEFT WRIST  Final   Special Requests   Final    BOTTLES DRAWN  AEROBIC ONLY Blood Culture adequate volume   Culture   Final    NO GROWTH 5 DAYS Performed at [facility], [address], [place], [place] [address]    Report Status 01/25/2018 FINAL  Final  Blood culture (routine x 2)     Status: None   Collection Time: 01/20/18  7:30 PM  Result Value Ref Range Status   Specimen Description BLOOD RIGHT WRIST  Final   Special Requests   Final    BOTTLES DRAWN AEROBIC ONLY Blood Culture adequate volume   Culture   Final    NO GROWTH 5 DAYS Performed at [facility], [address] [place], [place] [address]    Report Status 01/25/2018 FINAL  Final  Culture, blood (routine x 2)     Status: None (Preliminary result)   Collection Time: 01/24/18 10:02 PM  Result Value Ref Range Status   Specimen Description BLOOD RIGHT ANTECUBITAL  Final   Special Requests AEROBIC BOTTLE ONLY Blood Culture adequate volume  Final   Culture   Final    NO GROWTH 3 DAYS Performed at [facility], [address] [place], [place] [address]    Report Status PENDING  Incomplete  Culture, blood (routine x 2)     Status: None (Preliminary result)   Collection Time: 01/24/18 10:12 PM  Result Value Ref Range Status   Specimen Description BLOOD RIGHT HAND  Final   Special Requests AEROBIC BOTTLE ONLY Blood Culture adequate volume  Final   Culture   Final    NO GROWTH 3 DAYS Performed at [facility], [address], [place], [place] [address]    Report Status PENDING  Incomplete      Studies: No results found.  Scheduled Meds: . amoxicillin-clavulanate  1 tablet Oral Q12H  . chlorhexidine  15 mL Mouth Rinse BID  . Chlorhexidine Gluconate Cloth  6 each Topical Daily  . doxepin  10 mg Oral QHS  . feeding supplement (GLUCERNA SHAKE)  237 mL Oral BID BM  . folic acid  1 mg Oral Daily  . Gerhardt's butt cream   Topical BID  . heparin injection (subcutaneous)  5,000 Units Subcutaneous Q8H  . insulin aspart  0-5 Units Subcutaneous QHS  .  insulin aspart  0-9 Units Subcutaneous TID WC  . insulin aspart  3 Units Subcutaneous TID WC  . insulin detemir  12 Units Subcutaneous BID  . mouth rinse  15 mL Mouth Rinse q12n4p  . multivitamin with minerals  1 tablet Oral Daily  . nicotine  21 mg Transdermal Q24H  . pantoprazole  40 mg Oral Daily  . potassium chloride  50 mEq Oral Daily  . sodium chloride flush  10-40 mL Intracatheter Q12H  . traZODone  150 mg Oral QHS    Continuous Infusions:    LOS: 8 days     Briant Cedar, MD [facility]   If 7PM-7AM, please contact night-coverage [email or web address] Password [facility] 01/28/2018, 5:35 PM

## 2018-01-29 ENCOUNTER — Encounter (HOSPITAL_COMMUNITY): Payer: Self-pay | Admitting: Cardiology

## 2018-01-29 DIAGNOSIS — E440 Moderate protein-calorie malnutrition: Secondary | ICD-10-CM

## 2018-01-29 LAB — GLUCOSE, CAPILLARY
GLUCOSE-CAPILLARY: 264 mg/dL — AB (ref 65–99)
GLUCOSE-CAPILLARY: 249 mg/dL — AB (ref 65–99)
GLUCOSE-CAPILLARY: 171 mg/dL — AB (ref 65–99)
Glucose-Capillary: 409 mg/dL — ABNORMAL HIGH (ref 65–99)

## 2018-01-29 MED ORDER — TRAMADOL HCL 50 MG PO TABS
50.0000 mg | ORAL_TABLET | Freq: Once | ORAL | Status: AC
  Administered 2018-01-29: 50 mg via ORAL

## 2018-01-29 MED ORDER — DIPHENHYDRAMINE HCL 25 MG PO CAPS
50.0000 mg | ORAL_CAPSULE | Freq: Once | ORAL | Status: AC | PRN
  Administered 2018-01-29: 50 mg via ORAL
  Filled 2018-01-29: qty 2

## 2018-01-29 MED ORDER — INSULIN ASPART 100 UNIT/ML ~~LOC~~ SOLN
10.0000 [IU] | Freq: Once | SUBCUTANEOUS | Status: AC
  Administered 2018-01-29: 10 [IU] via SUBCUTANEOUS

## 2018-01-29 MED ORDER — SUMATRIPTAN SUCCINATE 6 MG/0.5ML ~~LOC~~ SOLN
6.0000 mg | Freq: Once | SUBCUTANEOUS | Status: AC
  Administered 2018-01-29: 6 mg via SUBCUTANEOUS
  Filled 2018-01-29: qty 0.5

## 2018-01-29 NOTE — Progress Notes (Signed)
Patient refused to check repeat BP.

## 2018-01-29 NOTE — Plan of Care (Signed)
Progressing

## 2018-01-29 NOTE — Progress Notes (Signed)
Patient refusing telemetry. Says he does not feel like wearing it. Received order to D/C telemetry.

## 2018-01-29 NOTE — Progress Notes (Signed)
PROGRESS NOTE  [name] HKV:[id number] DOB: 07/21/1986 DOA: 01/20/2018 PCP: Fleet Contras, MD  HPI/Recap of past 77 hours: 32 year old male with PMHx of Asthma, Anemia, DM Type 1, GERD, HTN, Schizoaffective disorder, Depression with multiple admissions secondary to DKA 2/2 medical non-compliance. Pt presented to ED on 2/3 with recurrent DKA. Upon arrival patient is hypotensive, hyperglycemic, and unresponsive.Per EMS patient found with crushed pills around him. Roommate called EMS, however, unsure how long patient has been unresponsive.VBG with pH of 6.6. Glucose 1175. Started on Insulin and Bicarb gtt, given one liter of NS. Intubated for airway protection and admitted to the ICU. Pt was extubated remained stable and was transferred to [facility] service on 01/23/18.  Patient noted to have multiple complaints daily, especially with food (people playing games with his food). Reported some headache this morning, denies any neck stiffness, fever/chills, abdominal pain, chest pain, SOB, dizziness.   Assessment/Plan: Principal Problem:   Altered mental status Active Problems:   DKA (diabetic ketoacidoses) ([name])   Respiratory failure ([name])   Endotracheally intubated   Increased anion gap metabolic acidosis   Pressure injury of skin   Ankle ulcer ([name])   Dental abscess   Lung nodules   Malnutrition of moderate degree  Pulmonary nodular lesions likely due to diffuse septic embolism ?? Source from multiple dental caries Afebrile, with resolved leukocytosis LA 1.8, procalcitonin elevated at 3.04 BC X 2 done on 01/20/18, NGTD, repeat NGTD, HIV neg CT chest angio: showed pulm nodular lesion likely due to diffuse septic embolism ID consulted: rec TEE, no vegetations noted Changed from Unasyn to PO Augmentin for a total of 14 days  Dental caries Likely the culprit for ?diffuse septic emboli Orthopantogram showed dental caries in all remaining teeth Consulted Dr Kristin Bruins pager  [phone] who plans to perform teeth extraction on 01/31/18 at 11:30am, under gen anaesthesia and would prefer pt remain in hospital till that date due to social issues   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 Currently afebrile with no leukocytosis,, no signs of overt infection Foot xray showed: No acute abnormality noted Wound care team, diabetes coordinator on board  Non-cardiac chest pain Atypical Trop X 2, flat trend 0.04, no acute ST changes D-dimer elevated, CT angio as above Monitor closely  Acute respiratory failure with hypoxia  Resolved  Multifactorial metabolic encephalopathy, asthma  Diabetes Type 1 uncontrolled with complications/ DKA Fluctuating BS -->brittle DM Adjust insulin regimen prn Levemir, Novolog TID with meals, SSI  Hoarse voice/Dysphagia/GERD Mod barium swallow done, rec regular food with thin liquids  ENT consulted: Laryngoscopy showed edematous and ecchymotic right vocal cord. This is likely secondary to intubation trauma. Also moderate posterior laryngeal edema, consistent with his history of GERD. Continue pantoprazole, voice rest. May follow up as outpt  Hypovolemic shock Resolved Secondary to DKA/dehydration  Chronic Diastolic CHF 1/30 echocardiogram: LVEF 60-65%, -RIGHT ventricle moderately dilated Atrial septum:  increased thickness of the septum, consistent with lipomatous hypertrophy. Strict in and out, Daily weight  Essential HTN Stable  Acute kidney injury (baseline Cr<1)  Resolved Secondary to DKA/uncontrolled diabetes Continue hydration  Hypokalemia/Hypophosphatemia/Hypomagnesemia Replace prn, monitor daily  Normocytic anemia Mixed picture Folate low 3.3, iron 34, TIBC low, Vit B12 825 Start folic acid Daily CBC   ??Attempted Suicide Patient found down with multiple crushed pills around him. Unfortunately upon admission UDS not obtained Psychiatric on board: continue doxepin, no further  recs  Schizoaffective disorder Continue Trazodone 150 mg QHS, Doxepin 10  mg QHS Haldol IV 2 mg QID prn     Code Status: Full  Family Communication: None at bedside  Disposition Plan: TBD   Consultants:  PCCM  Psychiatry  ID  Dentist  ENT  Procedures:  Intubation  Antimicrobials:  PO Augmnetin  DVT prophylaxis:  [place] Heparin   Objective: Vitals:   01/28/18 0842 01/28/18 1509 01/28/18 2245 01/29/18 0505  BP: 129/60 (!) 144/96 134/77 (!) 95/50  Pulse:  97 (!) 115 (!) 112  Resp: 15 20 16 15   Temp: 98.1 F (36.7 C) 98.9 F (37.2 C) 98.5 F (36.9 C) 97.9 F (36.6 C)  TempSrc: Oral Oral    SpO2: 96% 97% 100% 95%  Weight:      Height:        Intake/Output Summary (Last 24 hours) at 01/29/2018 1027 Last data filed at 01/29/2018 9935 Gross per 24 hour  Intake 1066 ml  Output -  Net 1066 ml   Filed Weights   01/28/18 0735  Weight: 65.3 kg (144 lb)    Exam:   General: Alert, awake, ill-appearing, cachetic  Cardiovascular: S1-S2 present, no added hrt sound  Respiratory: Chest clear bilaterally   Abdomen: Soft, non-tender, non-distended, BS present   Musculoskeletal: No bilateral lower extremity edema  Skin: Normal  Psychiatry: Normal mood   Data Reviewed: CBC: Recent Labs  Lab 01/24/18 1241 01/25/18 1600 01/26/18 0157 01/27/18 0215 01/28/18 0440  WBC 6.2 5.2 6.8 5.3 4.1  HGB 8.1* 7.5* 7.8* 8.7* 7.2*  HCT 24.1* 22.6* 23.7* 26.5* 22.7*  MCV 88.3 87.3 88.1 90.1 89.7  PLT 98* 122* 142* 217 170   Basic Metabolic Panel: Recent Labs  Lab 01/23/18 0304 01/23/18 1505 01/24/18 1241 01/25/18 1600 01/26/18 0157 01/27/18 0215 01/28/18 0440  NA 147*  --  145 138 140 140 143  K 3.4* 4.2 3.5 4.4 3.8 3.9 4.4  CL 119*  --  112* 103 103 103 104  CO2 19*  --  25 23 27 26 27   GLUCOSE 141*  --  116* 154* 82 47* 153*  BUN 19  --  11 15 12 11 13   CREATININE 1.34*  --  1.00 1.02 1.11 0.87 1.04  CALCIUM 7.7*  --  8.0* 7.9* 7.9* 8.4* 8.0*   MG  --  1.5* 1.7 1.7 1.6*  --   --   PHOS 2.0*  --   --   --   --   --   --    GFR: Estimated Creatinine Clearance: 93.3 mL/min (by C-G formula based on SCr of 1.04 mg/dL). Liver Function Tests: Recent Labs  Lab 01/23/18 0304  AST 45*  ALT 24  ALKPHOS 100  BILITOT 0.3  PROT 4.1*  ALBUMIN 2.1*  2.2*   No results for input(s): LIPASE, AMYLASE in the last 168 hours. No results for input(s): AMMONIA in the last 168 hours. Coagulation Profile: No results for input(s): INR, PROTIME in the last 168 hours. Cardiac Enzymes: Recent Labs  Lab 01/23/18 1505 01/23/18 1921 01/24/18 2143  TROPONINI 0.04* 0.04* <0.03   BNP (last 3 results) No results for input(s): PROBNP in the last 8760 hours. HbA1C: No results for input(s): HGBA1C in the last 72 hours. CBG: Recent Labs  Lab 01/28/18 0904 01/28/18 1153 01/28/18 1622 01/28/18 2247 01/29/18 0828  GLUCAP 129* 166* 177* 210* 264*   Lipid Profile: No results for input(s): CHOL, HDL, LDLCALC, TRIG, CHOLHDL, LDLDIRECT in the last 72 hours. Thyroid Function Tests: No results for input(s): TSH, T4TOTAL,  FREET4, T3FREE, THYROIDAB in the last 72 hours. Anemia Panel: No results for input(s): VITAMINB12, FOLATE, FERRITIN, TIBC, IRON, RETICCTPCT in the last 72 hours. Urine analysis:    Component Value Date/Time   COLORURINE STRAW (A) 01/25/2018 0609   APPEARANCEUR CLEAR 01/25/2018 0609   LABSPEC 1.010 01/25/2018 0609   PHURINE 7.0 01/25/2018 0609   GLUCOSEU 150 (A) 01/25/2018 0609   HGBUR NEGATIVE 01/25/2018 0609   BILIRUBINUR NEGATIVE 01/25/2018 0609   BILIRUBINUR negative 03/29/2015 1114   KETONESUR NEGATIVE 01/25/2018 0609   PROTEINUR NEGATIVE 01/25/2018 0609   UROBILINOGEN 0.2 05/17/2015 1810   NITRITE NEGATIVE 01/25/2018 0609   LEUKOCYTESUR NEGATIVE 01/25/2018 0609   Sepsis Labs: @LABRCNTIP (procalcitonin:4,lacticidven:4)  ) Recent Results (from the past 240 hour(s))  Blood culture (routine x 2)     Status: None    Collection Time: 01/20/18  7:25 PM  Result Value Ref Range Status   Specimen Description BLOOD LEFT WRIST  Final   Special Requests   Final    BOTTLES DRAWN AEROBIC ONLY Blood Culture adequate volume   Culture   Final    NO GROWTH 5 DAYS Performed at [facility], [address], [place], [place] [address]    Report Status 01/25/2018 FINAL  Final  Blood culture (routine x 2)     Status: None   Collection Time: 01/20/18  7:30 PM  Result Value Ref Range Status   Specimen Description BLOOD RIGHT WRIST  Final   Special Requests   Final    BOTTLES DRAWN AEROBIC ONLY Blood Culture adequate volume   Culture   Final    NO GROWTH 5 DAYS Performed at [facility], [address] [place], [place] [address]    Report Status 01/25/2018 FINAL  Final  Culture, blood (routine x 2)     Status: None (Preliminary result)   Collection Time: 01/24/18 10:02 PM  Result Value Ref Range Status   Specimen Description BLOOD RIGHT ANTECUBITAL  Final   Special Requests AEROBIC BOTTLE ONLY Blood Culture adequate volume  Final   Culture   Final    NO GROWTH 4 DAYS Performed at [facility], [address], [place], [place] [address]    Report Status PENDING  Incomplete  Culture, blood (routine x 2)     Status: None (Preliminary result)   Collection Time: 01/24/18 10:12 PM  Result Value Ref Range Status   Specimen Description BLOOD RIGHT HAND  Final   Special Requests AEROBIC BOTTLE ONLY Blood Culture adequate volume  Final   Culture   Final    NO GROWTH 4 DAYS Performed at [facility], [address], [place], [place] [address]    Report Status PENDING  Incomplete      Studies: No results found.  Scheduled Meds: . amoxicillin-clavulanate  1 tablet Oral Q12H  . chlorhexidine  15 mL Mouth Rinse BID  . Chlorhexidine Gluconate Cloth  6 each Topical Daily  . doxepin  10 mg Oral QHS  . feeding supplement (GLUCERNA SHAKE)  237 mL Oral BID BM  . folic acid  1 mg Oral Daily  .  Gerhardt's butt cream   Topical BID  . heparin injection (subcutaneous)  5,000 Units Subcutaneous Q8H  . insulin aspart  0-5 Units Subcutaneous QHS  . insulin aspart  0-9 Units Subcutaneous TID WC  . insulin aspart  3 Units Subcutaneous TID WC  . insulin detemir  12 Units Subcutaneous BID  . mouth rinse  15 mL Mouth Rinse q12n4p  . multivitamin with minerals  1 tablet Oral Daily  . nicotine  21 mg Transdermal Q24H  . pantoprazole  40 mg Oral Daily  . potassium chloride  50 mEq Oral Daily  . sodium chloride flush  10-40 mL Intracatheter Q12H  . traZODone  150 mg Oral QHS    Continuous Infusions:    LOS: 9 days     Briant Cedar, MD [facility]   If 7PM-7AM, please contact night-coverage [email or web address] Password [facility] 01/29/2018, 10:27 AM

## 2018-01-30 ENCOUNTER — Other Ambulatory Visit: Payer: Self-pay

## 2018-01-30 DIAGNOSIS — E440 Moderate protein-calorie malnutrition: Secondary | ICD-10-CM

## 2018-01-30 DIAGNOSIS — R4020 Unspecified coma: Secondary | ICD-10-CM

## 2018-01-30 DIAGNOSIS — K047 Periapical abscess without sinus: Secondary | ICD-10-CM

## 2018-01-30 LAB — CULTURE, BLOOD (ROUTINE X 2)
Culture: NO GROWTH
Culture: NO GROWTH
SPECIAL REQUESTS: ADEQUATE
Special Requests: ADEQUATE

## 2018-01-30 LAB — GLUCOSE, CAPILLARY
GLUCOSE-CAPILLARY: 276 mg/dL — AB (ref 65–99)
GLUCOSE-CAPILLARY: 221 mg/dL — AB (ref 65–99)
GLUCOSE-CAPILLARY: 233 mg/dL — AB (ref 65–99)
Glucose-Capillary: 203 mg/dL — ABNORMAL HIGH (ref 65–99)
Glucose-Capillary: 204 mg/dL — ABNORMAL HIGH (ref 65–99)

## 2018-01-30 MED ORDER — IBUPROFEN 200 MG PO TABS
200.0000 mg | ORAL_TABLET | ORAL | Status: DC | PRN
  Administered 2018-01-30: 200 mg via ORAL
  Filled 2018-01-30: qty 1

## 2018-01-30 MED ORDER — TRAMADOL HCL 50 MG PO TABS
50.0000 mg | ORAL_TABLET | Freq: Four times a day (QID) | ORAL | Status: DC | PRN
  Administered 2018-01-30 (×2): 50 mg via ORAL
  Filled 2018-01-30 (×3): qty 1

## 2018-01-30 MED ORDER — CHLORHEXIDINE GLUCONATE CLOTH 2 % EX PADS: 6.0000 | MEDICATED_PAD | Freq: Every day | CUTANEOUS | Status: DC

## 2018-01-30 MED ORDER — SUMATRIPTAN SUCCINATE 6 MG/0.5ML ~~LOC~~ SOLN
6.0000 mg | Freq: Once | SUBCUTANEOUS | Status: AC
  Administered 2018-01-30: 6 mg via SUBCUTANEOUS
  Filled 2018-01-30: qty 0.5

## 2018-01-30 MED ORDER — CEFAZOLIN SODIUM-DEXTROSE 2-4 GM/100ML-% IV SOLN
2.0000 g | INTRAVENOUS | Status: AC
  Administered 2018-01-31: 2 g via INTRAVENOUS
  Filled 2018-01-30: qty 100

## 2018-01-30 NOTE — Progress Notes (Signed)
Patient refused to check vital signs, lab draw and heparin sq this morning.

## 2018-01-30 NOTE — Progress Notes (Signed)
Patient complained of migraine score  7 day shift Nurse gave fioricet at 1909. Again patient complain  Of Migraine and gave Tramadol 50 mg at 2231. Patient stated that not help then informed the on call md vega and given tramadol 50 mg and Benadryl 50 mg oral at 2353 as per order.  After 3 hrs, patient stated medicine doesn't help him  and he wants Imitrex sq then informed on call MD vega and gave Imitrex 6 mg sq at 0339 as per order. Explained to the  patient about Imitrex cann't take before 24 hrs. Patient had CBG 409 at 2233 informed on call MD and given novolog 10 unit as per order.

## 2018-01-30 NOTE — Progress Notes (Signed)
Inpatient Diabetes Program Recommendations  AACE/ADA: New Consensus Statement on Inpatient Glycemic Control (2015)  Target Ranges:  Prepandial:   less than 140 mg/dL      Peak postprandial:   less than 180 mg/dL (1-2 hours)      Critically ill patients:  140 - 180 mg/dL   Lab Results  Component Value Date   GLUCAP 221 (H) 01/30/2018   HGBA1C 9.8 (H) 01/26/2018    Review of Glycemic Control Results for XAIDYN, KEPNER (MRN [id number]) as of 01/30/2018 11:35  Ref. Range 01/29/2018 16:30 01/29/2018 22:33 01/30/2018 01:20 01/30/2018 07:41  Glucose-Capillary Latest Ref Range: 65 - 99 mg/dL 734 (H) 287 (H) 681 (H) 221 (H)    Home DM Meds: Levemir 18 units BID Novolog 0-15 units TID Novolog 6 units TID with meals   Current Orders: Levemir 12 units BID Novolog Sensitive Correction Scale/ SSI (0-9 units) TID AC + HS Novolog 3 units TID with meals  Inpatient Diabetes Program Recommendations:   Consider increasing Novolog to 4 Units TID with meals (when eating >50%). Will continue to watch trends for increasing meal coverage.   Thanks, Lujean Rave, MSN, RNC-OB Diabetes Coordinator [phone] (8a-5p)

## 2018-01-30 NOTE — Anesthesia Preprocedure Evaluation (Addendum)
Anesthesia Evaluation  Patient identified by MRN, date of birth, ID band Patient awake    Reviewed: Allergy & Precautions, H&P , NPO status , Patient's Chart, lab work & pertinent test results  Airway Mallampati: I  TM Distance: >3 FB Neck ROM: Full    Dental no notable dental hx. (+) Teeth Intact, Dental Advisory Given   Pulmonary asthma , Current Smoker,    Pulmonary exam normal breath sounds clear to auscultation       Cardiovascular Exercise Tolerance: Good hypertension, Pt. on medications  Rhythm:Regular Rate:Normal  '19 TEE - EF was in the range of 55% to 60%.    Neuro/Psych  Headaches, Seizures -,  PSYCHIATRIC DISORDERS Anxiety Depression Schizophrenia    GI/Hepatic GERD  ,(+)     substance abuse  , Polysubstance abuse - patient denies, but well documented in chart   Endo/Other  diabetes, Poorly Controlled, Insulin Dependent  Renal/GU negative Renal ROS  negative genitourinary   Musculoskeletal  (+) Arthritis , Osteoarthritis,    Abdominal   Peds  Hematology negative hematology ROS (+) anemia ,   Anesthesia Other Findings   Reproductive/Obstetrics negative OB ROS                            Anesthesia Physical  Anesthesia Plan  ASA: III  Anesthesia Plan: General   Post-op Pain Management:    Induction: Intravenous  PONV Risk Score and Plan: 2 and Ondansetron, Treatment may vary due to age or medical condition, Midazolam and Scopolamine patch - Pre-op  Airway Management Planned: Nasal ETT  Additional Equipment: None  Intra-op Plan:   Post-operative Plan: Extubation in OR  Informed Consent: I have reviewed the patients History and Physical, chart, labs and discussed the procedure including the risks, benefits and alternatives for the proposed anesthesia with the patient or authorized representative who has indicated his/her understanding and acceptance.   Dental  advisory given  Plan Discussed with: CRNA  Anesthesia Plan Comments:         Anesthesia Quick Evaluation

## 2018-01-30 NOTE — Progress Notes (Signed)
PROGRESS NOTE    [name]  ERD:[id number] DOB: 1986-11-17 DOA: 01/20/2018 PCP: Fleet Contras, MD    Brief Narrative:  32 year old male who presented with diabetes ketoacidosis.  He does have the significant past medical history for asthma, depression, diabetes mellitus, hypertension, GERD, polysubstance abuse, schizoaffective schizophrenia. In the emergency department he was unresponsive, he required invasive mechanical ventilation. His blood pressure was 131/88, heart rate 77, respiratory rate 14, oxygen saturation 100%. His lungs were clear to auscultation bilaterally, heart S1-S2 present and rhythmic, abdomen was soft nontender, no lower extremity edema.   Patient was admitted to the hospital and diagnosis of diabetes ketoacidosis, complicated by acute respiratory failure related to metabolic encephalopathy.   Assessment & Plan:   Principal Problem:   Altered mental status Active Problems:   DKA (diabetic ketoacidoses) ([name])   Respiratory failure ([name])   Endotracheally intubated   Increased anion gap metabolic acidosis   Pressure injury of skin   Ankle ulcer ([name])   Dental abscess   Lung nodules   Malnutrition of moderate degree  1. Diabetes ketoacidosis. Ketosis has resolved, will continue glucose cover and monitoring with insulin sliding scale and basal insulin, 12 units of levimir and 3 units of pre-meal aspart. Capillary glucose 171, 409, 276, 221, 233. Will need further adjustment, depending on capillary glucose.   2. Diffuse septic pulmonary embolism. No endocarditis, will continue antibiotic therapy with Augmentin, plan for a total of 14 days.   3. Hypertension. Well controlled htn, systolic blood pressure 125. No signs of volume overload.   4. Acute kidney injury. Stable renal function with serum cr at 1,04, with k at 4,4 will follow on renal panel in am, avoid hypotension. Will continue low dose acetaminophen.   5. Schizoaffective disorder. No agitation or  confusion, continue neuro checks per unit protocol. Continue doxepin. As needed haldol.    DVT prophylaxis: enoxaparin  Code Status:  full Family Communication:  No family at the bedside  Disposition Plan: home   Consultants:   ENT  Dentist  Procedures:     Antimicrobials:       Subjective: Patient feeling better, still complains of dysphagia and moderate pain at sacral pressure ulcer, intermittent nausea but no vomiting.   Objective: Vitals:   01/28/18 2245 01/29/18 0505 01/29/18 1354 01/29/18 2235  BP:  (!) 95/50 132/82 129/80  Pulse: (!) 115 (!) 112 (!) 118 (!) 118  Resp: 16 15 16 18   Temp: 98.5 F (36.9 C) 97.9 F (36.6 C) 98.2 F (36.8 C) 98.6 F (37 C)  TempSrc:   Oral Oral  SpO2: 100% 95% 96% 100%  Weight:      Height:        Intake/Output Summary (Last 24 hours) at 01/30/2018 1036 Last data filed at 01/30/2018 0539 Gross per 24 hour  Intake 720 ml  Output -  Net 720 ml   Filed Weights   01/28/18 0735  Weight: 65.3 kg (144 lb)    Examination:   General: Not in pain or dyspnea, dconditioned Neurology: Awake and alert, non focal  E ENT: mild pallor, no icterus, oral mucosa moist Cardiovascular: No JVD. S1-S2 present, rhythmic, no gallops, rubs, or murmurs. No lower extremity edema. Pulmonary: vesicular breath sounds bilaterally, adequate air movement, no wheezing, rhonchi or rales. Gastrointestinal. Abdomen flat, no organomegaly, non tender, no rebound or guarding Skin. Sacral pressure ulcer stage 1 to 2, with no erythema or purulence.  Musculoskeletal: no joint deformities     Data Reviewed: I  have personally reviewed following labs and imaging studies  CBC: Recent Labs  Lab 01/24/18 1241 01/25/18 1600 01/26/18 0157 01/27/18 0215 01/28/18 0440  WBC 6.2 5.2 6.8 5.3 4.1  HGB 8.1* 7.5* 7.8* 8.7* 7.2*  HCT 24.1* 22.6* 23.7* 26.5* 22.7*  MCV 88.3 87.3 88.1 90.1 89.7  PLT 98* 122* 142* 217 170   Basic Metabolic Panel: Recent Labs    Lab 01/23/18 1505 01/24/18 1241 01/25/18 1600 01/26/18 0157 01/27/18 0215 01/28/18 0440  NA  --  145 138 140 140 143  K 4.2 3.5 4.4 3.8 3.9 4.4  CL  --  112* 103 103 103 104  CO2  --  25 23 27 26 27   GLUCOSE  --  116* 154* 82 47* 153*  BUN  --  11 15 12 11 13   CREATININE  --  1.00 1.02 1.11 0.87 1.04  CALCIUM  --  8.0* 7.9* 7.9* 8.4* 8.0*  MG 1.5* 1.7 1.7 1.6*  --   --    GFR: Estimated Creatinine Clearance: 93.3 mL/min (by C-G formula based on SCr of 1.04 mg/dL). Liver Function Tests: No results for input(s): AST, ALT, ALKPHOS, BILITOT, PROT, ALBUMIN in the last 168 hours. No results for input(s): LIPASE, AMYLASE in the last 168 hours. No results for input(s): AMMONIA in the last 168 hours. Coagulation Profile: No results for input(s): INR, PROTIME in the last 168 hours. Cardiac Enzymes: Recent Labs  Lab 01/23/18 1505 01/23/18 1921 01/24/18 2143  TROPONINI 0.04* 0.04* <0.03   BNP (last 3 results) No results for input(s): PROBNP in the last 8760 hours. HbA1C: No results for input(s): HGBA1C in the last 72 hours. CBG: Recent Labs  Lab 01/29/18 1207 01/29/18 1630 01/29/18 2233 01/30/18 0120 01/30/18 0741  GLUCAP 249* 171* 409* 276* 221*   Lipid Profile: No results for input(s): CHOL, HDL, LDLCALC, TRIG, CHOLHDL, LDLDIRECT in the last 72 hours. Thyroid Function Tests: No results for input(s): TSH, T4TOTAL, FREET4, T3FREE, THYROIDAB in the last 72 hours. Anemia Panel: No results for input(s): VITAMINB12, FOLATE, FERRITIN, TIBC, IRON, RETICCTPCT in the last 72 hours.    Radiology Studies: I have reviewed all of the imaging during this hospital visit personally     Scheduled Meds: . amoxicillin-clavulanate  1 tablet Oral Q12H  . chlorhexidine  15 mL Mouth Rinse BID  . Chlorhexidine Gluconate Cloth  6 each Topical Daily  . doxepin  10 mg Oral QHS  . feeding supplement (GLUCERNA SHAKE)  237 mL Oral BID BM  . folic acid  1 mg Oral Daily  . Gerhardt's butt  cream   Topical BID  . heparin injection (subcutaneous)  5,000 Units Subcutaneous Q8H  . insulin aspart  0-5 Units Subcutaneous QHS  . insulin aspart  0-9 Units Subcutaneous TID WC  . insulin aspart  3 Units Subcutaneous TID WC  . insulin detemir  12 Units Subcutaneous BID  . mouth rinse  15 mL Mouth Rinse q12n4p  . multivitamin with minerals  1 tablet Oral Daily  . nicotine  21 mg Transdermal Q24H  . pantoprazole  40 mg Oral Daily  . potassium chloride  50 mEq Oral Daily  . sodium chloride flush  10-40 mL Intracatheter Q12H  . traZODone  150 mg Oral QHS   Continuous Infusions: . [START ON 01/31/2018]  ceFAZolin (ANCEF) IV       LOS: 10 days        Malachi Suderman 02/01/18, MD [facility] Pager [phone]

## 2018-01-31 ENCOUNTER — Inpatient Hospital Stay (HOSPITAL_COMMUNITY): Payer: Medicare Other | Admitting: Anesthesiology

## 2018-01-31 ENCOUNTER — Encounter (HOSPITAL_COMMUNITY): Payer: Self-pay | Admitting: Certified Registered Nurse Anesthetist

## 2018-01-31 ENCOUNTER — Encounter (HOSPITAL_COMMUNITY)
Admission: EM | Disposition: A | Discharge status: Home / Self Care | Payer: Self-pay | Source: Home / Self Care | Attending: Internal Medicine

## 2018-01-31 DIAGNOSIS — K0530 Chronic periodontitis, unspecified: Secondary | ICD-10-CM

## 2018-01-31 DIAGNOSIS — K029 Dental caries, unspecified: Secondary | ICD-10-CM | POA: Diagnosis present

## 2018-01-31 DIAGNOSIS — K045 Chronic apical periodontitis: Secondary | ICD-10-CM | POA: Diagnosis present

## 2018-01-31 DIAGNOSIS — K083 Retained dental root: Secondary | ICD-10-CM

## 2018-01-31 DIAGNOSIS — M270 Developmental disorders of jaws: Secondary | ICD-10-CM | POA: Diagnosis present

## 2018-01-31 DIAGNOSIS — K0889 Other specified disorders of teeth and supporting structures: Secondary | ICD-10-CM

## 2018-01-31 HISTORY — PX: MULTIPLE EXTRACTIONS WITH ALVEOLOPLASTY: SHX5342

## 2018-01-31 LAB — GLUCOSE, CAPILLARY
GLUCOSE-CAPILLARY: 237 mg/dL — AB (ref 65–99)
GLUCOSE-CAPILLARY: 308 mg/dL — AB (ref 65–99)
GLUCOSE-CAPILLARY: 473 mg/dL — AB (ref 65–99)
Glucose-Capillary: 335 mg/dL — ABNORMAL HIGH (ref 65–99)
Glucose-Capillary: 378 mg/dL — ABNORMAL HIGH (ref 65–99)
Glucose-Capillary: 403 mg/dL — ABNORMAL HIGH (ref 65–99)

## 2018-01-31 LAB — SURGICAL PCR SCREEN
MRSA, PCR: NEGATIVE
Staphylococcus aureus: NEGATIVE

## 2018-01-31 LAB — PREPARE RBC (CROSSMATCH)

## 2018-01-31 SURGERY — MULTIPLE EXTRACTION WITH ALVEOLOPLASTY
Anesthesia: General

## 2018-01-31 MED ORDER — HYDROMORPHONE HCL 1 MG/ML IJ SOLN
0.2500 mg | INTRAMUSCULAR | Status: DC | PRN
  Administered 2018-01-31 (×4): 0.5 mg via INTRAVENOUS

## 2018-01-31 MED ORDER — MIDAZOLAM HCL 5 MG/5ML IJ SOLN
INTRAMUSCULAR | Status: DC | PRN
  Administered 2018-01-31: 2 mg via INTRAVENOUS

## 2018-01-31 MED ORDER — SUCCINYLCHOLINE CHLORIDE 200 MG/10ML IV SOSY
PREFILLED_SYRINGE | INTRAVENOUS | Status: AC
  Filled 2018-01-31: qty 10

## 2018-01-31 MED ORDER — HEPARIN SODIUM (PORCINE) 5000 UNIT/ML IJ SOLN
5000.0000 [IU] | Freq: Three times a day (TID) | INTRAMUSCULAR | Status: DC
  Administered 2018-01-31 – 2018-02-02 (×6): 5000 [IU] via SUBCUTANEOUS
  Filled 2018-01-31 (×7): qty 1

## 2018-01-31 MED ORDER — ONDANSETRON HCL 4 MG/2ML IJ SOLN
INTRAMUSCULAR | Status: AC
  Filled 2018-01-31: qty 2

## 2018-01-31 MED ORDER — LIDOCAINE 2% (20 MG/ML) 5 ML SYRINGE
INTRAMUSCULAR | Status: AC
  Filled 2018-01-31: qty 5

## 2018-01-31 MED ORDER — MIDAZOLAM HCL 2 MG/2ML IJ SOLN
INTRAMUSCULAR | Status: AC
  Filled 2018-01-31: qty 2

## 2018-01-31 MED ORDER — KETAMINE HCL 10 MG/ML IJ SOLN
INTRAMUSCULAR | Status: DC | PRN
  Administered 2018-01-31: 10 mg via INTRAVENOUS
  Administered 2018-01-31: 30 mg via INTRAVENOUS

## 2018-01-31 MED ORDER — TRAMADOL HCL 50 MG PO TABS: 50.0000 mg | ORAL_TABLET | Freq: Four times a day (QID) | ORAL | Status: DC | PRN

## 2018-01-31 MED ORDER — MORPHINE SULFATE (PF) 2 MG/ML IV SOLN
INTRAVENOUS | Status: AC
  Administered 2018-01-31: 2 mg via INTRAVENOUS
  Filled 2018-01-31: qty 1

## 2018-01-31 MED ORDER — SUGAMMADEX SODIUM 200 MG/2ML IV SOLN
INTRAVENOUS | Status: DC | PRN
  Administered 2018-01-31: 130.6 mg via INTRAVENOUS

## 2018-01-31 MED ORDER — PROPOFOL 10 MG/ML IV BOLUS
INTRAVENOUS | Status: AC
  Filled 2018-01-31: qty 40

## 2018-01-31 MED ORDER — BUPIVACAINE-EPINEPHRINE (PF) 0.5% -1:200000 IJ SOLN
INTRAMUSCULAR | Status: AC
  Filled 2018-01-31: qty 5.4

## 2018-01-31 MED ORDER — LACTATED RINGERS IV SOLN
INTRAVENOUS | Status: DC | PRN
  Administered 2018-01-31 (×2): via INTRAVENOUS

## 2018-01-31 MED ORDER — SCOPOLAMINE 1 MG/3DAYS TD PT72
MEDICATED_PATCH | TRANSDERMAL | Status: DC | PRN
  Administered 2018-01-31: 1 via TRANSDERMAL

## 2018-01-31 MED ORDER — ROCURONIUM BROMIDE 10 MG/ML (PF) SYRINGE
PREFILLED_SYRINGE | INTRAVENOUS | Status: AC
  Filled 2018-01-31: qty 5

## 2018-01-31 MED ORDER — SODIUM CHLORIDE 0.9 % IV SOLN
INTRAVENOUS | Status: DC
  Administered 2018-01-31 – 2018-02-01 (×2): via INTRAVENOUS

## 2018-01-31 MED ORDER — ROCURONIUM BROMIDE 10 MG/ML (PF) SYRINGE
PREFILLED_SYRINGE | INTRAVENOUS | Status: DC | PRN
  Administered 2018-01-31: 50 mg via INTRAVENOUS
  Administered 2018-01-31 (×2): 20 mg via INTRAVENOUS

## 2018-01-31 MED ORDER — 0.9 % SODIUM CHLORIDE (POUR BTL) OPTIME
TOPICAL | Status: DC | PRN
  Administered 2018-01-31: 1000 mL

## 2018-01-31 MED ORDER — OXYMETAZOLINE HCL 0.05 % NA SOLN
NASAL | Status: AC
  Filled 2018-01-31: qty 15

## 2018-01-31 MED ORDER — SODIUM CHLORIDE 0.9 % IV SOLN: Freq: Once | INTRAVENOUS | Status: DC

## 2018-01-31 MED ORDER — FENTANYL CITRATE (PF) 100 MCG/2ML IJ SOLN
INTRAMUSCULAR | Status: DC | PRN
  Administered 2018-01-31: 50 ug via INTRAVENOUS
  Administered 2018-01-31: 150 ug via INTRAVENOUS

## 2018-01-31 MED ORDER — KETAMINE HCL-SODIUM CHLORIDE 100-0.9 MG/10ML-% IV SOSY
PREFILLED_SYRINGE | INTRAVENOUS | Status: AC
  Filled 2018-01-31: qty 10

## 2018-01-31 MED ORDER — PROMETHAZINE HCL 25 MG/ML IJ SOLN: 6.2500 mg | INTRAMUSCULAR | Status: DC | PRN

## 2018-01-31 MED ORDER — OXYMETAZOLINE HCL 0.05 % NA SOLN
NASAL | Status: DC | PRN
  Administered 2018-01-31 (×2): 2 via NASAL

## 2018-01-31 MED ORDER — PROPOFOL 10 MG/ML IV BOLUS: INTRAVENOUS | Status: DC | PRN

## 2018-01-31 MED ORDER — MORPHINE SULFATE 2 MG/ML IV SOLN
INTRAVENOUS | Status: DC
  Administered 2018-01-31 (×2): 34.5 mg via INTRAVENOUS
  Administered 2018-01-31: 16:00:00 via INTRAVENOUS
  Administered 2018-01-31: 34.5 mg via INTRAVENOUS
  Administered 2018-01-31 – 2018-02-01 (×3): via INTRAVENOUS
  Administered 2018-02-01: 13.5 mg via INTRAVENOUS
  Administered 2018-02-02: 19.5 mg via INTRAVENOUS
  Administered 2018-02-02: 06:00:00 via INTRAVENOUS
  Administered 2018-02-02: 21 mg via INTRAVENOUS
  Administered 2018-02-02: 17.75 mg via INTRAVENOUS
  Filled 2018-01-31: qty 25
  Filled 2018-01-31 (×3): qty 30
  Filled 2018-01-31: qty 25
  Filled 2018-01-31 (×2): qty 30

## 2018-01-31 MED ORDER — DIPHENHYDRAMINE HCL 50 MG/ML IJ SOLN: 12.5000 mg | Freq: Four times a day (QID) | INTRAMUSCULAR | Status: DC | PRN

## 2018-01-31 MED ORDER — INSULIN ASPART 100 UNIT/ML ~~LOC~~ SOLN
12.0000 [IU] | Freq: Once | SUBCUTANEOUS | Status: AC
  Administered 2018-01-31: 12 [IU] via SUBCUTANEOUS

## 2018-01-31 MED ORDER — DIPHENHYDRAMINE HCL 12.5 MG/5ML PO ELIX
12.5000 mg | ORAL_SOLUTION | Freq: Four times a day (QID) | ORAL | Status: DC | PRN
  Administered 2018-02-01 – 2018-02-02 (×4): 12.5 mg via ORAL
  Filled 2018-01-31 (×4): qty 10

## 2018-01-31 MED ORDER — SUGAMMADEX SODIUM 200 MG/2ML IV SOLN
INTRAVENOUS | Status: AC
  Filled 2018-01-31: qty 2

## 2018-01-31 MED ORDER — LIDOCAINE 2% (20 MG/ML) 5 ML SYRINGE
INTRAMUSCULAR | Status: DC | PRN
  Administered 2018-01-31: 80 mg via INTRAVENOUS

## 2018-01-31 MED ORDER — INSULIN ASPART 100 UNIT/ML ~~LOC~~ SOLN
2.0000 [IU] | Freq: Once | SUBCUTANEOUS | Status: AC
  Administered 2018-01-31: 2 [IU] via SUBCUTANEOUS

## 2018-01-31 MED ORDER — FENTANYL CITRATE (PF) 250 MCG/5ML IJ SOLN
INTRAMUSCULAR | Status: AC
  Filled 2018-01-31: qty 5

## 2018-01-31 MED ORDER — HYDROMORPHONE HCL 1 MG/ML IJ SOLN
INTRAMUSCULAR | Status: AC
  Administered 2018-01-31: 0.5 mg via INTRAVENOUS
  Filled 2018-01-31: qty 1

## 2018-01-31 MED ORDER — SCOPOLAMINE 1 MG/3DAYS TD PT72
MEDICATED_PATCH | TRANSDERMAL | Status: AC
  Filled 2018-01-31: qty 1

## 2018-01-31 MED ORDER — BUPIVACAINE-EPINEPHRINE 0.5% -1:200000 IJ SOLN
INTRAMUSCULAR | Status: DC | PRN
  Administered 2018-01-31: 3.4 mL

## 2018-01-31 MED ORDER — LACTATED RINGERS IV SOLN
INTRAVENOUS | Status: DC
  Administered 2018-01-31 – 2018-02-01 (×2): via INTRAVENOUS

## 2018-01-31 MED ORDER — DEXAMETHASONE SODIUM PHOSPHATE 10 MG/ML IJ SOLN
INTRAMUSCULAR | Status: AC
  Filled 2018-01-31: qty 1

## 2018-01-31 MED ORDER — SODIUM CHLORIDE 0.9% FLUSH: 9.0000 mL | INTRAVENOUS | Status: DC | PRN

## 2018-01-31 MED ORDER — PROPOFOL 10 MG/ML IV BOLUS
INTRAVENOUS | Status: DC | PRN
  Administered 2018-01-31: 200 mg via INTRAVENOUS

## 2018-01-31 MED ORDER — ARTIFICIAL TEARS OPHTHALMIC OINT
TOPICAL_OINTMENT | OPHTHALMIC | Status: DC | PRN
  Administered 2018-01-31: 1 via OPHTHALMIC

## 2018-01-31 MED ORDER — LIDOCAINE-EPINEPHRINE 2 %-1:100000 IJ SOLN
INTRAMUSCULAR | Status: AC
  Filled 2018-01-31: qty 11.9

## 2018-01-31 MED ORDER — LIDOCAINE-EPINEPHRINE 2 %-1:100000 IJ SOLN
INTRAMUSCULAR | Status: DC | PRN
  Administered 2018-01-31: 10.2 mL via INTRADERMAL

## 2018-01-31 MED ORDER — EPHEDRINE 5 MG/ML INJ
INTRAVENOUS | Status: AC
  Filled 2018-01-31: qty 10

## 2018-01-31 MED ORDER — ACETAMINOPHEN 325 MG PO TABS
650.0000 mg | ORAL_TABLET | Freq: Four times a day (QID) | ORAL | Status: DC | PRN
  Administered 2018-01-31: 650 mg via ORAL
  Filled 2018-01-31: qty 2

## 2018-01-31 MED ORDER — PHENYLEPHRINE 40 MCG/ML (10ML) SYRINGE FOR IV PUSH (FOR BLOOD PRESSURE SUPPORT)
PREFILLED_SYRINGE | INTRAVENOUS | Status: AC
  Filled 2018-01-31: qty 10

## 2018-01-31 MED ORDER — IBUPROFEN 200 MG PO TABS
200.0000 mg | ORAL_TABLET | ORAL | Status: DC | PRN
  Administered 2018-01-31 – 2018-02-01 (×2): 200 mg via ORAL
  Filled 2018-01-31 (×2): qty 1

## 2018-01-31 MED ORDER — MORPHINE SULFATE (PF) 2 MG/ML IV SOLN
1.0000 mg | INTRAVENOUS | Status: DC | PRN
  Administered 2018-01-31: 1 mg via INTRAVENOUS
  Filled 2018-01-31: qty 1

## 2018-01-31 MED ORDER — PHENYLEPHRINE 40 MCG/ML (10ML) SYRINGE FOR IV PUSH (FOR BLOOD PRESSURE SUPPORT)
PREFILLED_SYRINGE | INTRAVENOUS | Status: DC | PRN
  Administered 2018-01-31 (×10): 80 ug via INTRAVENOUS

## 2018-01-31 MED ORDER — NALOXONE HCL 0.4 MG/ML IJ SOLN: 0.4000 mg | INTRAMUSCULAR | Status: DC | PRN

## 2018-01-31 MED ORDER — DEXAMETHASONE SODIUM PHOSPHATE 10 MG/ML IJ SOLN
INTRAMUSCULAR | Status: DC | PRN
  Administered 2018-01-31: 10 mg via INTRAVENOUS

## 2018-01-31 MED ORDER — ONDANSETRON HCL 4 MG/2ML IJ SOLN: 4.0000 mg | Freq: Four times a day (QID) | INTRAMUSCULAR | Status: DC | PRN

## 2018-01-31 SURGICAL SUPPLY — 37 items
ALCOHOL 70% 16 OZ (MISCELLANEOUS) ×3 IMPLANT
ATTRACTOMAT 16X20 MAGNETIC DRP (DRAPES) ×3 IMPLANT
BLADE SURG 15 STRL LF DISP TIS (BLADE) ×2 IMPLANT
BLADE SURG 15 STRL SS (BLADE) ×4
COVER SURGICAL LIGHT HANDLE (MISCELLANEOUS) ×3 IMPLANT
GAUZE PACKING FOLDED 2  STR (GAUZE/BANDAGES/DRESSINGS) ×2
GAUZE PACKING FOLDED 2 STR (GAUZE/BANDAGES/DRESSINGS) ×1 IMPLANT
GAUZE SPONGE 4X4 16PLY XRAY LF (GAUZE/BANDAGES/DRESSINGS) ×3 IMPLANT
GLOVE BIO SURGEON STRL SZ 6.5 (GLOVE) ×2 IMPLANT
GLOVE BIO SURGEONS STRL SZ 6.5 (GLOVE) ×1
GLOVE SURG ORTHO 8.0 STRL STRW (GLOVE) ×3 IMPLANT
GOWN STRL REUS W/ TWL LRG LVL3 (GOWN DISPOSABLE) ×1 IMPLANT
GOWN STRL REUS W/TWL 2XL LVL3 (GOWN DISPOSABLE) ×3 IMPLANT
GOWN STRL REUS W/TWL LRG LVL3 (GOWN DISPOSABLE) ×2
HEMOSTAT SURGICEL 2X14 (HEMOSTASIS) ×3 IMPLANT
KIT BASIN OR (CUSTOM PROCEDURE TRAY) ×3 IMPLANT
KIT ROOM TURNOVER OR (KITS) ×3 IMPLANT
MANIFOLD NEPTUNE WASTE (CANNULA) ×3 IMPLANT
NEEDLE BLUNT 16X1.5 OR ONLY (NEEDLE) ×3 IMPLANT
NEEDLE DENTAL 27 LONG (NEEDLE) ×6 IMPLANT
NS IRRIG 1000ML POUR BTL (IV SOLUTION) ×3 IMPLANT
PACK EENT II TURBAN DRAPE (CUSTOM PROCEDURE TRAY) ×3 IMPLANT
PAD ARMBOARD 7.5X6 YLW CONV (MISCELLANEOUS) ×3 IMPLANT
SPONGE SURGIFOAM ABS GEL 100 (HEMOSTASIS) IMPLANT
SPONGE SURGIFOAM ABS GEL 12-7 (HEMOSTASIS) IMPLANT
SPONGE SURGIFOAM ABS GEL SZ50 (HEMOSTASIS) IMPLANT
SUCTION FRAZIER HANDLE 10FR (MISCELLANEOUS) ×2
SUCTION TUBE FRAZIER 10FR DISP (MISCELLANEOUS) ×1 IMPLANT
SUT CHROMIC 3 0 PS 2 (SUTURE) ×6 IMPLANT
SUT CHROMIC 4 0 P 3 18 (SUTURE) IMPLANT
SYR 50ML SLIP (SYRINGE) ×3 IMPLANT
TOWEL OR 17X26 10 PK STRL BLUE (TOWEL DISPOSABLE) ×3 IMPLANT
TUBE CONNECTING 12'X1/4 (SUCTIONS) ×1
TUBE CONNECTING 12X1/4 (SUCTIONS) ×2 IMPLANT
WATER STERILE IRR 1000ML POUR (IV SOLUTION) ×3 IMPLANT
WATER TABLETS ICX (MISCELLANEOUS) ×3 IMPLANT
YANKAUER SUCT BULB TIP NO VENT (SUCTIONS) ×3 IMPLANT

## 2018-01-31 NOTE — Plan of Care (Signed)
  Progressing Clinical Measurements: Will remain free from infection 01/31/2018 0353 - Progressing by Burtis Junes, RN Plan for surgery to removed dental extraction, continues to take antibiotics as ordered.

## 2018-01-31 NOTE — Anesthesia Postprocedure Evaluation (Signed)
Anesthesia Post Note  Patient: [name]  Procedure(s) Performed: Extraction of tooth #'s 2,3,5-12, 14-16, 17, 19-30 and 32 with alveoloplasty and bilateral mandibular lingaul tori reductions. (N/A )     Patient location during evaluation: PACU Anesthesia Type: General Level of consciousness: awake and alert Pain management: pain level controlled Vital Signs Assessment: post-procedure vital signs reviewed and stable Respiratory status: spontaneous breathing, nonlabored ventilation, respiratory function stable and patient connected to nasal cannula oxygen Cardiovascular status: blood pressure returned to baseline and stable Postop Assessment: no apparent nausea or vomiting Anesthetic complications: no    Last Vitals:  Vitals:   01/31/18 1028 01/31/18 1043  BP: 115/67 118/69  Pulse: (!) 119 (!) 116  Resp: 16 13  Temp:    SpO2: 96% 99%    Last Pain:  Vitals:   01/31/18 1048  TempSrc:   PainSc: 6                  Beryle Lathe

## 2018-01-31 NOTE — Progress Notes (Signed)
Pt refusing all morning meds after teeth extraction, including insulin's. Last CBG at 1101 is 335. Patient refused to have CBG redrawn. MD made aware. Will continue to follow.

## 2018-01-31 NOTE — Progress Notes (Signed)
Second page to Dr. Ella Jubilee to notify of blood glucose.

## 2018-01-31 NOTE — Interval H&P Note (Signed)
Anesthesia H&P Update: History and Physical Exam reviewed; patient is OK for planned anesthetic and procedure. ? ?

## 2018-01-31 NOTE — Anesthesia Procedure Notes (Signed)
Procedure Name: Intubation Date/Time: 01/31/2018 8:10 AM Performed by: Lowella Dell, CRNA Pre-anesthesia Checklist: Patient identified, Emergency Drugs available, Suction available and Patient being monitored Patient Re-evaluated:Patient Re-evaluated prior to induction Oxygen Delivery Method: Circle System Utilized Preoxygenation: Pre-oxygenation with 100% oxygen Induction Type: IV induction Ventilation: Mask ventilation without difficulty Laryngoscope Size: Mac and 4 Grade View: Grade I Nasal Tubes: Nasal Rae, Nasal prep performed and Right Tube size: 7.0 mm Number of attempts: 1 Placement Confirmation: ETT inserted through vocal cords under direct vision,  positive ETCO2 and breath sounds checked- equal and bilateral Secured at: 26 cm Tube secured with: Tape Dental Injury: Teeth and Oropharynx as per pre-operative assessment  Comments: Easy MV. Easy nontraumatic nasal intubation without the use of Magills. No evidence to support "difficult airway" tag that is present in Epic.

## 2018-01-31 NOTE — Progress Notes (Signed)
Spoke with Dr Ella Jubilee, new orders received.

## 2018-01-31 NOTE — Progress Notes (Signed)
PRE-OPERATIVE NOTE:  01/31/2018 [name] [id number]  VITALS: BP 114/76 (BP Location: Left Arm)   Pulse (!) 111   Temp 98 F (36.7 C)   Resp 17   Ht 6' (1.829 m)   Wt 144 lb (65.3 kg)   SpO2 99%   BMI 19.53 kg/m   Lab Results  Component Value Date   WBC 4.1 01/28/2018   HGB 7.2 (L) 01/28/2018   HCT 22.7 (L) 01/28/2018   MCV 89.7 01/28/2018   PLT 170 01/28/2018   BMET    Component Value Date/Time   NA 143 01/28/2018 0440   K 4.4 01/28/2018 0440   CL 104 01/28/2018 0440   CO2 27 01/28/2018 0440   GLUCOSE 153 (H) 01/28/2018 0440   BUN 13 01/28/2018 0440   CREATININE 1.04 01/28/2018 0440   CALCIUM 8.0 (L) 01/28/2018 0440   GFRNONAA >60 01/28/2018 0440   GFRAA >60 01/28/2018 0440    Lab Results  Component Value Date   INR 1.28 01/14/2018   INR 1.38 08/04/2017   INR 1.49 07/01/2017   No results found for: PTT   [name] presents for multiple dental extractions with alveoloplasty and pre-prosthetic surgery in the operating room with general anesthesia.   SUBJECTIVE: The patient denies any acute medical or dental changes and agrees to proceed with treatment as planned.  EXAM: No sign of acute dental changes.  ASSESSMENT: Patient is affected by chronic apical periodontitis, dental caries, multiple retained root segments, chronic periodontitis, and loose teeth.  PLAN: Patient agrees to proceed with treatment as planned in the operating room as previously discussed and accepts the risks, benefits, and complications of the proposed treatment. Patient is aware of the risk for bleeding, bruising, swelling, infection, pain, nerve damage, soft tissue damage, sinus involvement, root tip fracture, mandible fracture, and the risks of complications associated with the anesthesia. Patient also is aware of the potential for other complications not mentioned above.   Elease Swarm F. Makenli Derstine, DDS 

## 2018-01-31 NOTE — Progress Notes (Signed)
Pt. Has called 911, 7 times. Consulting civil engineer, Chiropodist, security, MD and [employer] notified. Patient advised and phone removed from room. Pt in uncontrolled pain and MD notified. 1 time order of verbal order of 2mg  of Morphine ordered until PCA pump can be started. Pt. Resting now. Will continue to monitor.

## 2018-01-31 NOTE — Progress Notes (Signed)
Inpatient Diabetes Program Recommendations  AACE/ADA: New Consensus Statement on Inpatient Glycemic Control (2015)  Target Ranges:  Prepandial:   less than 140 mg/dL      Peak postprandial:   less than 180 mg/dL (1-2 hours)      Critically ill patients:  140 - 180 mg/dL  Results for MAHMUD, KEITHLY (MRN [id number]) as of 01/31/2018 08:28  Ref. Range 01/30/2018 01:20 01/30/2018 07:41 01/30/2018 11:35 01/30/2018 16:59 01/30/2018 22:07  Glucose-Capillary Latest Ref Range: 65 - 99 mg/dL 166 (H) 063 (H) 016 (H) 203 (H) 204 (H)    Review of Glycemic Control  Outpatient Diabetes medications: Levemir 18 units BID, Novolog 6 units TID with meals, Novolog 0-15 units QID Current orders for Inpatient glycemic control: Levemir 12 units BID, Novolog 0-9 units TID with meals, Novolog 0-5 units QHS, Novolog 3 units TID with meals for meal coverage  Inpatient Diabetes Program Recommendations:  Insulin - Basal: Please consider increasing Levemir to 13 units BID. Insulin - Meal Coverage: When diet resumed, please increase Novolog meal coverage to Novolog 5 units TID with meals.  Thanks, Orlando Penner, RN, MSN, CDE Diabetes Coordinator Inpatient Diabetes Program [phone] (Team Pager from 8am to 5pm)

## 2018-01-31 NOTE — Progress Notes (Signed)
PROGRESS NOTE    [name]  OHF:[id number] DOB: 1986/11/12 DOA: 01/20/2018 PCP: Fleet Contras, MD    Brief Narrative:  32 year old male who presented with diabetes ketoacidosis.  He does have the significant past medical history for asthma, depression, diabetes mellitus, hypertension, GERD, polysubstance abuse, schizoaffective schizophrenia. In the emergency department he was unresponsive, he required invasive mechanical ventilation. His blood pressure was 131/88, heart rate 77, respiratory rate 14, oxygen saturation 100%. His lungs were clear to auscultation bilaterally, heart S1-S2 present and rhythmic, abdomen was soft nontender, no lower extremity edema.   Patient was admitted to the hospital and diagnosis of diabetes ketoacidosis, complicated by acute respiratory failure related to metabolic encephalopathy.   Assessment & Plan:   Principal Problem:   Altered mental status Active Problems:   DKA (diabetic ketoacidoses) ([name])   Respiratory failure ([name])   Endotracheally intubated   Increased anion gap metabolic acidosis   Pressure injury of skin   Ankle ulcer ([name])   Lung nodules   Malnutrition of moderate degree   Chronic apical periodontitis   Caries   Retained dental root   Torus mandibularis   Chronic periodontitis   1. Diabetes ketoacidosis/ Type 1 DM. Post procedure patient has been refusing insulin, capillary glucose has been elevated up to 300's. Will improve pain control and will continue insulin sliding scale, poor oral intake due to post operative pain. Will continue basal insulin dose of 12 units with pre-meal of 3 units.   2. Severe chronic periodontitis. Patient underwent 1. Multiple extraction of tooth numbers 2, 3, 5 -12, 14 -16, 17, 19-30, and 32. 4 Quadrants of alveoloplasty and bilateral mandibular lingual tori reductions. Severe pain post procedure, will place patient on a morphine pca pumo for pain control, will follow post op recommendations,    3. Diffuse septic pulmonary embolism. Will continue Augmentin, plan for a total of 14 days. No vegetations on echocardiography.   4. Hypertension. Will continue blood pressure monitoring. Continue pain control.   5. Acute kidney injury. Poor oral intake due to pos-operative pain, will add gentle IV fluids, and will follow on renal panel in am.   6. Schizoaffective disorder. Neuro checks per unit protocol. On doxepin and as needed haldol.    DVT prophylaxis: enoxaparin  Code Status:  full Family Communication:  No family at the bedside  Disposition Plan: home   Consultants:   ENT  Dentist  Procedures:     Antimicrobials:    Augementin   Subjective: Patient is post dental extractions, he is in severe pain, not able to take anything po, refusing insulin, Positive nausea, no vomiting, no chest pain.   Objective: Vitals:   01/31/18 1013 01/31/18 1016 01/31/18 1028 01/31/18 1043  BP: 117/65  115/67 118/69  Pulse: (!) 116  (!) 119 (!) 116  Resp:   16 13  Temp:  (!) 97.2 F (36.2 C)    TempSrc:      SpO2: 100%  96% 99%  Weight:      Height:        Intake/Output Summary (Last 24 hours) at 01/31/2018 1416 Last data filed at 01/31/2018 0945 Gross per 24 hour  Intake 1000 ml  Output 100 ml  Net 900 ml   Filed Weights   01/28/18 0735  Weight: 65.3 kg (144 lb)    Examination:   General: In severe pain and  deconditioned  Neurology: Awake and alert, non focal  E ENT: mild pallor, no icterus, oral mucosa moist Cardiovascular:  No JVD. S1-S2 present, rhythmic, no gallops, rubs, or murmurs. No lower extremity edema. Pulmonary: vesicular breath sounds bilaterally, adequate air movement, no wheezing, rhonchi or rales. Gastrointestinal. Abdomen flat, no organomegaly, non tender, no rebound or guarding Skin. No rashes Musculoskeletal: no joint deformities     Data Reviewed: I have personally reviewed following labs and imaging studies  CBC: Recent  Labs  Lab 01/25/18 1600 01/26/18 0157 01/27/18 0215 01/28/18 0440  WBC 5.2 6.8 5.3 4.1  HGB 7.5* 7.8* 8.7* 7.2*  HCT 22.6* 23.7* 26.5* 22.7*  MCV 87.3 88.1 90.1 89.7  PLT 122* 142* 217 170   Basic Metabolic Panel: Recent Labs  Lab 01/25/18 1600 01/26/18 0157 01/27/18 0215 01/28/18 0440  NA 138 140 140 143  K 4.4 3.8 3.9 4.4  CL 103 103 103 104  CO2 23 27 26 27   GLUCOSE 154* 82 47* 153*  BUN 15 12 11 13   CREATININE 1.02 1.11 0.87 1.04  CALCIUM 7.9* 7.9* 8.4* 8.0*  MG 1.7 1.6*  --   --    GFR: Estimated Creatinine Clearance: 93.3 mL/min (by C-G formula based on SCr of 1.04 mg/dL). Liver Function Tests: No results for input(s): AST, ALT, ALKPHOS, BILITOT, PROT, ALBUMIN in the last 168 hours. No results for input(s): LIPASE, AMYLASE in the last 168 hours. No results for input(s): AMMONIA in the last 168 hours. Coagulation Profile: No results for input(s): INR, PROTIME in the last 168 hours. Cardiac Enzymes: Recent Labs  Lab 01/24/18 2143  TROPONINI <0.03   BNP (last 3 results) No results for input(s): PROBNP in the last 8760 hours. HbA1C: No results for input(s): HGBA1C in the last 72 hours. CBG: Recent Labs  Lab 01/30/18 1659 01/30/18 2207 01/31/18 0758 01/31/18 1017 01/31/18 1101  GLUCAP 203* 204* 237* 308* 335*   Lipid Profile: No results for input(s): CHOL, HDL, LDLCALC, TRIG, CHOLHDL, LDLDIRECT in the last 72 hours. Thyroid Function Tests: No results for input(s): TSH, T4TOTAL, FREET4, T3FREE, THYROIDAB in the last 72 hours. Anemia Panel: No results for input(s): VITAMINB12, FOLATE, FERRITIN, TIBC, IRON, RETICCTPCT in the last 72 hours.    Radiology Studies: I have reviewed all of the imaging during this hospital visit personally     Scheduled Meds: . amoxicillin-clavulanate  1 tablet Oral Q12H  . chlorhexidine  15 mL Mouth Rinse BID  . Chlorhexidine Gluconate Cloth  6 each Topical Daily  . doxepin  10 mg Oral QHS  . feeding supplement  (GLUCERNA SHAKE)  237 mL Oral BID BM  . folic acid  1 mg Oral Daily  . Gerhardt's butt cream   Topical BID  . heparin injection (subcutaneous)  5,000 Units Subcutaneous Q8H  . insulin aspart  0-5 Units Subcutaneous QHS  . insulin aspart  0-9 Units Subcutaneous TID WC  . insulin aspart  3 Units Subcutaneous TID WC  . insulin detemir  12 Units Subcutaneous BID  . mouth rinse  15 mL Mouth Rinse q12n4p  . multivitamin with minerals  1 tablet Oral Daily  . nicotine  21 mg Transdermal Q24H  . pantoprazole  40 mg Oral Daily  . potassium chloride  50 mEq Oral Daily  . sodium chloride flush  10-40 mL Intracatheter Q12H  . traZODone  150 mg Oral QHS   Continuous Infusions: . sodium chloride    . lactated ringers 10 mL/hr at 01/31/18 1217     LOS: 11 days        Araiya Tilmon 02/02/18, MD [facility] Pager  [phone]  

## 2018-01-31 NOTE — Progress Notes (Addendum)
CRITICAL VALUE ALERT  Critical Value:  Capillary blood glucose 473  Date & Time Notied:  01/31/2018 at 17:35  Provider Notified: Dr. Ella Jubilee paged  Orders Received/Actions taken: Awaiting response.  Placed order for stat blood glucose draw per standing order.

## 2018-01-31 NOTE — Discharge Instructions (Signed)

## 2018-01-31 NOTE — Progress Notes (Signed)
Nutrition Follow-up  DOCUMENTATION CODES:   Non-severe (moderate) malnutrition in context of chronic illness  INTERVENTION:  Continue Glucerna Shake po BID, each supplement provides 220 kcal and 10 grams of protein with diet advancement  Premier Protein daily with lunch, each supplement provides 160 calories and 30 gm protein, with diet advancement  Continue MVI w/ Minerals  NUTRITION DIAGNOSIS:   Moderate Malnutrition related to chronic illness(Uncontrolled DM in setting os psych illness) as evidenced by moderate fat depletion, moderate muscle depletion. -ongoing  GOAL:   Patient will meet greater than or equal to 90% of their needs -unmet with clear liquid diet  MONITOR:   PO intake, Supplement acceptance, Weight trends, I & O's   ASSESSMENT:   32 y/o male PMhx HTN, DM1, GERD, RA Anxiety/depression, Polysubstance abuse, Schizoaffective Schizophrenia, noncompliance, repeated admissions for DKA d/t noncompliance. Presented again w/ DKA and severe metabolic acidosis leading to resp compromise, ultimately required intubation. Extubated 2/5.   Now s/p extraction of multiple teeth, 4 quadrants of alveoloplasty, BL mandibular lingual tori reductions with severe pain. Patient refusing insulin. Not consuming much. Consuming 85% of meals prior to procedure. As pain is controlled PO should improve.  Labs reviewed:  CBGs 335, 308, 237   Medications reviewed and include:  Folic Acid, Insulin, 50K+ NS at 68mL/hr, LR KVO   Diet Order:  Diet clear liquid Room service appropriate? No; Fluid consistency: Thin  EDUCATION NEEDS:   Not appropriate for education at this time  Skin:  Skin Assessment: Skin Integrity Issues: Skin Integrity Issues:: Stage II Stage II: To left foot  Last BM:  01/30/2017  Height:   Ht Readings from Last 1 Encounters:  01/28/18 6' (1.829 m)    Weight:   Wt Readings from Last 1 Encounters:  01/28/18 144 lb (65.3 kg)    Ideal Body Weight:  80.91  kg  BMI:  Body mass index is 19.53 kg/m.  Estimated Nutritional Needs:   Kcal:  2100-2300 (32-35 kcal/kg bw)  Protein:  92-105g Pro (1.4-1.6 g/kg bw)  Fluid:  >2.3 L fluid (35 ml/kg bw)  Dionne Ano. Selby Foisy, MS, RD LDN Inpatient Clinical Dietitian Pager [phone]

## 2018-01-31 NOTE — Transfer of Care (Signed)
Immediate Anesthesia Transfer of Care Note  Patient: [name]  Procedure(s) Performed: Extraction of tooth #'s 2,3,5-12, 14-16, 17, 19-30 and 32 with alveoloplasty and bilateral mandibular lingaul tori reductions. (N/A )  Patient Location: PACU  Anesthesia Type:General  Level of Consciousness: awake, drowsy and patient cooperative  Airway & Oxygen Therapy: Patient Spontanous Breathing and Patient connected to face mask oxygen  Post-op Assessment: Report given to RN, Post -op Vital signs reviewed and stable and Patient moving all extremities X 4  Post vital signs: Reviewed and stable  Last Vitals:  Vitals:   01/30/18 1347 01/30/18 2209  BP: 125/82 114/76  Pulse: (!) 106 (!) 111  Resp: 20 17  Temp: (!) 36.4 C 36.7 C  SpO2: 100% 99%    Last Pain:  Vitals:   01/31/18 0024  TempSrc:   PainSc: 3       Patients Stated Pain Goal: 2 (01/30/18 2324)  Complications: No apparent anesthesia complications

## 2018-01-31 NOTE — Op Note (Signed)
OPERATIVE REPORT  Patient:            [name] Date of Birth:  Oct 30, 1986 MRN:                [id number]   DATE OF PROCEDURE:  01/31/2018  PREOPERATIVE DIAGNOSES: 1.  History of diabetic ketoacidosis 2.  Chronic apical periodontitis 3.  Retained root segments 4.  Dental caries 5.  Bilateral mandibular lingual tori 6.  Chronic periodontitis 7.  Loose teeth  POSTOPERATIVE DIAGNOSES: 1.  History of diabetic ketoacidosis 2.  Chronic apical periodontitis 3.  Retained root segments 4.  Dental caries 5.  Bilateral mandibular lingual tori 6.  Chronic periodontitis 7.  Loose teeth  OPERATIONS: 1. Multiple extraction of tooth numbers 2, 3, 5 -12, 14 -16, 17, 19-30, and 32  2. 4 Quadrants of alveoloplasty 3.  Bilateral mandibular lingual tori reductions   SURGEON: Charlynne Pander, DDS  ASSISTANT: Pearletha Alfred (dental assistant)  ANESTHESIA: General anesthesia via nasoendotracheal tube.  MEDICATIONS: 1. Ancef 2 g IV prior to invasive dental procedures. 2. Local anesthesia with a total utilization of 6 carpules each containing 34 mg of lidocaine with 0.017 mg of epinephrine as well as 2 carpules each containing 9 mg of bupivacaine with 0.009 mg of epinephrine.  SPECIMENS: There are 27 teeth that were discarded.  DRAINS: None  CULTURES: None  COMPLICATIONS: None  ESTIMATED BLOOD LOSS: 100 mLs.  INTRAVENOUS FLUIDS: 1000 mLs of Lactated ringers solution.  INDICATIONS: The patient was recently admitted with a history of altered mental status and diabetic ketoacidosis.  A medically necessary dental consultation was then requested to evaluate poor dentition as a source for poor diabetic control..  The patient was examined and treatment planned for extraction of remaining teeth with alveoloplasty and pre-prosthetic surgery as needed in the operating room with general anesthesia.  This treatment plan was formulated to decrease the risks and complications  associated with dental infection from affecting the patient's systemic health and the patient's glycemic control.  OPERATIVE FINDINGS: Patient was examined operating room number 19.  The teeth were identified for extraction. The patient was noted be affected by chronic periodontitis, dental caries, loose teeth, chronic apical periodontitis, multiple retained root segments, and presence of bilateral mandibular lingual tori.   DESCRIPTION OF PROCEDURE: Patient was brought to the main operating room number 19. Patient was then placed in the supine position on the operating table. General anesthesia was then induced per the anesthesia team. The patient was then prepped and draped in the usual manner for dental medicine procedure. A timeout was performed. The patient was identified and procedures were verified. A throat pack was placed at this time. The oral cavity was then thoroughly examined with the findings noted above. The patient was then ready for dental medicine procedure as follows:  Local anesthesia was then administered sequentially with a total utilization of 6 carpules each containing 34 mg of lidocaine with 0.017 mg of epinephrine as well as 2 carpules  each containing 9 mg bupivacaine with 0.009 mg of epinephrine.  The Maxillary left and right quadrants first approached. Anesthesia was then delivered utilizing infiltration with lidocaine with epinephrine. A #15 blade incision was then made from the maxillary right tuberosity and extended to the maxillary left tuberosity.  A  surgical flap was then carefully reflected. The teeth were then subluxated with a series of straight elevators. Tooth numbers 2,3,5-12,14-16 were then removed with a 150 forceps without complications. Alveoloplasty was then performed utilizing  a ronguers and bone file to help achieve primary closure. The surgical site was then irrigated with copious amounts of sterile saline. The tissues were approximated and trimmed  appropriately. The surgical site was then closed from the maxillary tuberosity extending the mesial right utilizing 3-0 chromic gut suture in a continuous interrupted suture technique x1.  The maxillary left surgical site was then closed from the maxillary left tuberosity and extended the mesial #9 utilizing 3-0 chromic gut suture in a continuous interrupted suture technique x1.  At this point time, the mandibular quadrants were approached. The patient was given bilateral inferior alveolar nerve blocks and long buccal nerve blocks utilizing the bupivacaine with epinephrine. Further infiltration was then achieved utilizing the lidocaine with epinephrine. A 15 blade incision was then made from the distal of number 17 and extended to the distal of #32.  A surgical flap was then carefully reflected.  The lower teeth were then subluxated with a series of straight elevators. Tooth numbers 17, 19 thru 30, and 32 were then removed utilizing a 151 forceps. Alveoloplasty was then performed utilizing a rongeurs and bone file to help achieve primary closure. The bilateral mandibular lingual tori were then visualized. These were then reduced utilizing a surgical handpiece and bur and copious amounts of sterile water. Further alveoloplasty was then performed utilizing rongeurs and bone file.The tissues were approximated and trimmed appropriately. The surgical sites were then irrigated with copious amounts of sterile saline times four. The mandibular left surgical site was then closed from the distal of 17 and extended the mesial #24 utilizing 3-0 chromic gut suture in a continuous interrupted suture technique x1.  The mandibular right surgical site was then closed from distal #32 and extended the mesial #25 utilizing 3-0 chromic gut suture in a continuous interrupted suture technique x1.    At this point time, the entire mouth was irrigated with copious amounts of sterile saline. The patient was examined for complications,  seeing none, the dental medicine procedure was deemed to be complete. The throat pack was removed at this time. An oral airway was then placed at the request of the anesthesia team. A series of 4 x 4 gauze were placed in the mouth to aid hemostasis. The patient was then handed over to the anesthesia team for final disposition. After an appropriate amount of time, the patient was extubated and taken to the postanesthsia care unit in good condition. All counts were correct for the dental medicine procedure.  Patient should be ready for discharge tomorrow morning from a dental standpoint barring any complications.  Medical team to prescribe appropriate pain medication as indicated at discharge.  Patient will be seen in approximately 7-10 days for evaluation of suture removal.  Patient will need to follow-up with a dentist of his choice for fabrication of upper and lower complete dentures after adequate healing.  Charlynne Pander, DDS.

## 2018-01-31 NOTE — Progress Notes (Signed)
Pt refused to be assessed by RN this AM. Will try again later.

## 2018-01-31 NOTE — H&P (View-Only) (Signed)
PRE-OPERATIVE NOTE:  01/31/2018 [name] [id number]  VITALS: BP 114/76 (BP Location: Left Arm)   Pulse (!) 111   Temp 98 F (36.7 C)   Resp 17   Ht 6' (1.829 m)   Wt 144 lb (65.3 kg)   SpO2 99%   BMI 19.53 kg/m   Lab Results  Component Value Date   WBC 4.1 01/28/2018   HGB 7.2 (L) 01/28/2018   HCT 22.7 (L) 01/28/2018   MCV 89.7 01/28/2018   PLT 170 01/28/2018   BMET    Component Value Date/Time   NA 143 01/28/2018 0440   K 4.4 01/28/2018 0440   CL 104 01/28/2018 0440   CO2 27 01/28/2018 0440   GLUCOSE 153 (H) 01/28/2018 0440   BUN 13 01/28/2018 0440   CREATININE 1.04 01/28/2018 0440   CALCIUM 8.0 (L) 01/28/2018 0440   GFRNONAA >60 01/28/2018 0440   GFRAA >60 01/28/2018 0440    Lab Results  Component Value Date   INR 1.28 01/14/2018   INR 1.38 08/04/2017   INR 1.49 07/01/2017   No results found for: PTT   [name] presents for multiple dental extractions with alveoloplasty and pre-prosthetic surgery in the operating room with general anesthesia.   SUBJECTIVE: The patient denies any acute medical or dental changes and agrees to proceed with treatment as planned.  EXAM: No sign of acute dental changes.  ASSESSMENT: Patient is affected by chronic apical periodontitis, dental caries, multiple retained root segments, chronic periodontitis, and loose teeth.  PLAN: Patient agrees to proceed with treatment as planned in the operating room as previously discussed and accepts the risks, benefits, and complications of the proposed treatment. Patient is aware of the risk for bleeding, bruising, swelling, infection, pain, nerve damage, soft tissue damage, sinus involvement, root tip fracture, mandible fracture, and the risks of complications associated with the anesthesia. Patient also is aware of the potential for other complications not mentioned above.   Charlynne Pander, DDS

## 2018-02-01 ENCOUNTER — Encounter (HOSPITAL_COMMUNITY): Payer: Self-pay | Admitting: Dentistry

## 2018-02-01 LAB — BASIC METABOLIC PANEL
Anion gap: 12 (ref 5–15)
BUN: 23 mg/dL — AB (ref 6–20)
CALCIUM: 9 mg/dL (ref 8.9–10.3)
CO2: 24 mmol/L (ref 22–32)
Chloride: 102 mmol/L (ref 101–111)
Creatinine, Ser: 0.67 mg/dL (ref 0.61–1.24)
GFR calc Af Amer: >60 mL/min (ref >60)
GLUCOSE: 117 mg/dL — AB (ref 65–99)
POTASSIUM: 5 mmol/L (ref 3.5–5.1)
Sodium: 138 mmol/L (ref 135–145)

## 2018-02-01 LAB — GLUCOSE, RANDOM: GLUCOSE: 187 mg/dL — AB (ref 65–99)

## 2018-02-01 LAB — GLUCOSE, CAPILLARY
GLUCOSE-CAPILLARY: 328 mg/dL — AB (ref 65–99)
GLUCOSE-CAPILLARY: 163 mg/dL — AB (ref 65–99)
Glucose-Capillary: 115 mg/dL — ABNORMAL HIGH (ref 65–99)
Glucose-Capillary: 81 mg/dL (ref 65–99)
Glucose-Capillary: 179 mg/dL — ABNORMAL HIGH (ref 65–99)

## 2018-02-01 MED ORDER — SODIUM CHLORIDE 0.9% FLUSH: 9.0000 mL | INTRAVENOUS | Status: DC | PRN

## 2018-02-01 MED ORDER — NALOXONE HCL 0.4 MG/ML IJ SOLN: 0.4000 mg | INTRAMUSCULAR | Status: DC | PRN

## 2018-02-01 MED ORDER — SODIUM CHLORIDE 0.9 % IR SOLN
200.0000 mL | Status: DC
  Administered 2018-02-01: 200 mL

## 2018-02-01 MED ORDER — DIPHENHYDRAMINE HCL 50 MG/ML IJ SOLN: 12.5000 mg | Freq: Four times a day (QID) | INTRAMUSCULAR | Status: DC | PRN

## 2018-02-01 MED ORDER — METOCLOPRAMIDE HCL 5 MG/ML IJ SOLN
10.0000 mg | Freq: Four times a day (QID) | INTRAMUSCULAR | Status: DC | PRN
  Administered 2018-02-02: 10 mg via INTRAVENOUS
  Filled 2018-02-01: qty 2

## 2018-02-01 MED ORDER — IBUPROFEN 400 MG PO TABS
400.0000 mg | ORAL_TABLET | Freq: Three times a day (TID) | ORAL | Status: DC
  Administered 2018-02-01 – 2018-02-02 (×3): 400 mg via ORAL
  Filled 2018-02-01 (×4): qty 1

## 2018-02-01 MED ORDER — MORPHINE SULFATE 2 MG/ML IV SOLN: INTRAVENOUS | Status: DC

## 2018-02-01 MED ORDER — DIPHENHYDRAMINE HCL 12.5 MG/5ML PO ELIX: 12.5000 mg | ORAL_SOLUTION | Freq: Four times a day (QID) | ORAL | Status: DC | PRN

## 2018-02-01 MED ORDER — ONDANSETRON HCL 4 MG/2ML IJ SOLN: 4.0000 mg | Freq: Four times a day (QID) | INTRAMUSCULAR | Status: DC | PRN

## 2018-02-01 NOTE — Progress Notes (Signed)
POST OPERATIVE NOTE:  02/01/2018 [name] [id number]  VITALS: BP (!) 92/54 (BP Location: Left Arm)   Pulse (!) 107   Temp (!) 97.4 F (36.3 C) (Oral)   Resp 12   Ht 6' (1.829 m)   Wt 144 lb (65.3 kg)   SpO2 96%   BMI 19.53 kg/m   LABS:  Lab Results  Component Value Date   WBC 4.1 01/28/2018   HGB 7.2 (L) 01/28/2018   HCT 22.7 (L) 01/28/2018   MCV 89.7 01/28/2018   PLT 170 01/28/2018   BMET    Component Value Date/Time   NA 143 01/28/2018 0440   K 4.4 01/28/2018 0440   CL 104 01/28/2018 0440   CO2 27 01/28/2018 0440   GLUCOSE 187 (H) 02/01/2018 0434   BUN 13 01/28/2018 0440   CREATININE 1.04 01/28/2018 0440   CALCIUM 8.0 (L) 01/28/2018 0440   GFRNONAA >60 01/28/2018 0440   GFRAA >60 01/28/2018 0440    Lab Results  Component Value Date   INR 1.28 01/14/2018   INR 1.38 08/04/2017   INR 1.49 07/01/2017   No results found for: PTT   [name] is status post traction of remaining teeth with alveoloplasty and pre-prosthetic surgery as needed in the operating room with general anesthesia on 01/31/2018.   SUBJECTIVE: Patient with minimal complaints of pain at this time.  Patient did have significant discomfort last evening that required morphine PCA pump to relieve the postoperative discomfort.  Patient without any active bleeding by report.  Stitches are still intact.  EXAM: There is no sign of infection, heme, or ooze.  Sutures are intact.  There is evidence of intraoral swelling and bruising.  ASSESSMENT: Post operative course is consistent with dental procedures performed in the operating room with general anesthesia. Loss of teeth due to extraction Patient is now edentulous There is atrophy of the edentulous alveolar ridges.  PLAN: 1.  Use chlorhexidine rinses twice daily after breakfast and at bedtime. 2.  Use salt water rinses in between the chlorhexidine rinses. 3.  Advance diet as tolerated with the aid of nutritional  consultation. 4.  Maintain Glucerna supplementation per previous orders 5.  Okay for discharge from dental standpoint 6.  Follow-up with dental medicine in 7-10 days for evaluation for suture removal.   7.  Patient to follow-up with a dentist of his choice for fabrication of upper and lower complete dentures after adequate healing.   Charlynne Pander, DDS

## 2018-02-01 NOTE — Progress Notes (Signed)
PROGRESS NOTE    [name]  MEB:[id number] DOB: 12-27-1985 DOA: 01/20/2018 PCP: Fleet Contras, MD    Brief Narrative:  32 year old male who presented with diabetes ketoacidosis. He does have the significant past medical history for asthma, depression, diabetes mellitus, hypertension, GERD, polysubstance abuse, schizoaffective schizophrenia. In the emergency department he was unresponsive, he required invasive mechanical ventilation. His blood pressure was 131/88, heart rate 77, respiratory rate 14, oxygen saturation 100%. His lungs were clear to auscultation bilaterally, heart S1-S2 present and rhythmic, abdomen was soft nontender, no lower extremity edema.   Patient was admitted to the hospital and diagnosis of diabetes ketoacidosis, complicated by acute respiratory failure related to metabolic encephalopathy   Assessment & Plan:   Principal Problem:   Altered mental status Active Problems:   DKA (diabetic ketoacidoses) ([name])   Respiratory failure ([name])   Endotracheally intubated   Increased anion gap metabolic acidosis   Pressure injury of skin   Ankle ulcer ([name])   Lung nodules   Malnutrition of moderate degree   Retained dental root   Torus mandibularis   1. Diabetes ketoacidosis/ Type 1 DM. Continue basal insulin dose of 12 units with pre-meal of 3 units, plus insulin sliding scale, pain is better controlled and patient accpeting to take insulin, capillary glucose 403, 378, 328, 163, 115.   2. Severe chronic periodontitis. Patient underwent 1. Multiple extraction of tooth numbers2, 3, 5-12, 14-16, 17,19-30, and 32.4Quadrants of alveoloplasty andbilateral mandibular lingual tori reductions. Pain with improve control with morphine pca pump, initial 12 hours, patient used about 34.5 mg of morphine, will continue to calculate requirements and transition to po in am. Will change ibuprofen to schedule 400 mg tid.   3. Diffuse septic pulmonary embolism. Tolerating  well Augmentin. No vegetations on echocardiography. Teeth have been extracted.   4. Hypertension. Pain with improved control, systolic blood pressure 92-100 mmHg.   5. Acute kidney injury. Stable renal function, with serum cr at 0.67, K at 5,0, will hold on K supplements and will follow on renal panel in am.   6. Schizoaffective disorder. Continue doxepin and as needed haldol.Pain with improve control.    DVT prophylaxis:enoxaparin Code Status:full Family Communication:No family at the bedside Disposition Plan:home   Consultants:  ENT  Dentist  Procedures:    Antimicrobials:   Augementin  Subjective: Patient feeling better, responding well to analgesics, no nausea or vomiting, no chest pain or dyspnea. Yesterday had elevated temperature.   Objective: Vitals:   01/31/18 2346 02/01/18 0400 02/01/18 0536 02/01/18 0823  BP:   (!) 92/54   Pulse:   (!) 107   Resp: 19 12 12 17   Temp:   (!) 97.4 F (36.3 C)   TempSrc:   Oral   SpO2: 99% 97% 96% 97%  Weight:      Height:        Intake/Output Summary (Last 24 hours) at 02/01/2018 1027 Last data filed at 01/31/2018 2346 Gross per 24 hour  Intake 1274.08 ml  Output -  Net 1274.08 ml   Filed Weights   01/28/18 0735  Weight: 65.3 kg (144 lb)    Examination:   General: Not in pain or dyspnea, deconditioned Neurology: Awake and alert, non focal  E ENT: mild pallor, no icterus, oral mucosa moist Cardiovascular: No JVD. S1-S2 present, rhythmic, no gallops, rubs, or murmurs. No lower extremity edema. Pulmonary: vesicular breath sounds bilaterally, adequate air movement, no wheezing, rhonchi or rales. Gastrointestinal. Abdomen flat, no organomegaly, non tender, no rebound  or guarding Skin. No rashes Musculoskeletal: no joint deformities     Data Reviewed: I have personally reviewed following labs and imaging studies  CBC: Recent Labs  Lab 01/25/18 1600 01/26/18 0157 01/27/18 0215  01/28/18 0440  WBC 5.2 6.8 5.3 4.1  HGB 7.5* 7.8* 8.7* 7.2*  HCT 22.6* 23.7* 26.5* 22.7*  MCV 87.3 88.1 90.1 89.7  PLT 122* 142* 217 170   Basic Metabolic Panel: Recent Labs  Lab 01/25/18 1600 01/26/18 0157 01/27/18 0215 01/28/18 0440 02/01/18 0434  NA 138 140 140 143  --   K 4.4 3.8 3.9 4.4  --   CL 103 103 103 104  --   CO2 23 27 26 27   --   GLUCOSE 154* 82 47* 153* 187*  BUN 15 12 11 13   --   CREATININE 1.02 1.11 0.87 1.04  --   CALCIUM 7.9* 7.9* 8.4* 8.0*  --   MG 1.7 1.6*  --   --   --    GFR: Estimated Creatinine Clearance: 93.3 mL/min (by C-G formula based on SCr of 1.04 mg/dL). Liver Function Tests: No results for input(s): AST, ALT, ALKPHOS, BILITOT, PROT, ALBUMIN in the last 168 hours. No results for input(s): LIPASE, AMYLASE in the last 168 hours. No results for input(s): AMMONIA in the last 168 hours. Coagulation Profile: No results for input(s): INR, PROTIME in the last 168 hours. Cardiac Enzymes: No results for input(s): CKTOTAL, CKMB, CKMBINDEX, TROPONINI in the last 168 hours. BNP (last 3 results) No results for input(s): PROBNP in the last 8760 hours. HbA1C: No results for input(s): HGBA1C in the last 72 hours. CBG: Recent Labs  Lab 01/31/18 1731 01/31/18 2131 02/01/18 0000 02/01/18 0128 02/01/18 0736  GLUCAP 473* 403* 378* 328* 163*   Lipid Profile: No results for input(s): CHOL, HDL, LDLCALC, TRIG, CHOLHDL, LDLDIRECT in the last 72 hours. Thyroid Function Tests: No results for input(s): TSH, T4TOTAL, FREET4, T3FREE, THYROIDAB in the last 72 hours. Anemia Panel: No results for input(s): VITAMINB12, FOLATE, FERRITIN, TIBC, IRON, RETICCTPCT in the last 72 hours.    Radiology Studies: I have reviewed all of the imaging during this hospital visit personally     Scheduled Meds: . amoxicillin-clavulanate  1 tablet Oral Q12H  . chlorhexidine  15 mL Mouth Rinse BID  . Chlorhexidine Gluconate Cloth  6 each Topical Daily  . doxepin  10 mg  Oral QHS  . feeding supplement (GLUCERNA SHAKE)  237 mL Oral BID BM  . folic acid  1 mg Oral Daily  . Gerhardt's butt cream   Topical BID  . heparin injection (subcutaneous)  5,000 Units Subcutaneous Q8H  . insulin aspart  0-5 Units Subcutaneous QHS  . insulin aspart  0-9 Units Subcutaneous TID WC  . insulin aspart  3 Units Subcutaneous TID WC  . insulin detemir  12 Units Subcutaneous BID  . mouth rinse  15 mL Mouth Rinse q12n4p  . morphine   Intravenous Q4H  . multivitamin with minerals  1 tablet Oral Daily  . nicotine  21 mg Transdermal Q24H  . pantoprazole  40 mg Oral Daily  . potassium chloride  50 mEq Oral Daily  . sodium chloride flush  10-40 mL Intracatheter Q12H  . traZODone  150 mg Oral QHS   Continuous Infusions: . sodium chloride    . sodium chloride 75 mL/hr at 02/01/18 0615  . lactated ringers 10 mL/hr at 01/31/18 1217  . sodium chloride irrigation       LOS:  12 days        Coralie Keens, MD [facility] Pager [phone]

## 2018-02-01 NOTE — Progress Notes (Signed)
Nelda Marseille, RN assisted bedside RN with replacing new PCA morphine syringe and clearing 4 hour total. Lorrin Mais, RN also witnessed a 0.5ML waste of Morphine from previous syringe. Pt comfortable at this time.

## 2018-02-02 DIAGNOSIS — J9601 Acute respiratory failure with hypoxia: Secondary | ICD-10-CM

## 2018-02-02 DIAGNOSIS — L97301 Non-pressure chronic ulcer of unspecified ankle limited to breakdown of skin: Secondary | ICD-10-CM

## 2018-02-02 DIAGNOSIS — L97509 Non-pressure chronic ulcer of other part of unspecified foot with unspecified severity: Secondary | ICD-10-CM

## 2018-02-02 LAB — BASIC METABOLIC PANEL
Anion gap: 13 (ref 5–15)
BUN: 21 mg/dL — AB (ref 6–20)
CHLORIDE: 97 mmol/L — AB (ref 101–111)
CO2: 25 mmol/L (ref 22–32)
Calcium: 8.8 mg/dL — ABNORMAL LOW (ref 8.9–10.3)
Creatinine, Ser: 0.81 mg/dL (ref 0.61–1.24)
GFR calc Af Amer: >60 mL/min (ref >60)
GFR calc non Af Amer: >60 mL/min (ref >60)
Glucose, Bld: 261 mg/dL — ABNORMAL HIGH (ref 65–99)
POTASSIUM: 4.7 mmol/L (ref 3.5–5.1)
SODIUM: 135 mmol/L (ref 135–145)

## 2018-02-02 LAB — GLUCOSE, CAPILLARY
GLUCOSE-CAPILLARY: 260 mg/dL — AB (ref 65–99)
Glucose-Capillary: 137 mg/dL — ABNORMAL HIGH (ref 65–99)

## 2018-02-02 MED ORDER — OXYCODONE-ACETAMINOPHEN 5-325 MG PO TABS: 1.0000 | ORAL_TABLET | Freq: Four times a day (QID) | ORAL | 0 refills | Status: AC | PRN

## 2018-02-02 MED ORDER — GLUCERNA SHAKE PO LIQD: 237.0000 mL | Freq: Two times a day (BID) | ORAL | 0 refills | Status: AC

## 2018-02-02 MED ORDER — OXYCODONE-ACETAMINOPHEN 5-325 MG PO TABS
1.0000 | ORAL_TABLET | Freq: Four times a day (QID) | ORAL | Status: DC | PRN
  Administered 2018-02-02: 1 via ORAL
  Filled 2018-02-02: qty 1

## 2018-02-02 MED ORDER — AMOXICILLIN-POT CLAVULANATE 875-125 MG PO TABS: 1.0000 | ORAL_TABLET | Freq: Two times a day (BID) | ORAL | 0 refills | Status: AC

## 2018-02-02 MED ORDER — IBUPROFEN 400 MG PO TABS: 400.0000 mg | ORAL_TABLET | Freq: Three times a day (TID) | ORAL | 0 refills | Status: AC | PRN

## 2018-02-02 MED ORDER — OXYCODONE-ACETAMINOPHEN 5-325 MG PO TABS
2.0000 | ORAL_TABLET | Freq: Once | ORAL | Status: AC
Start: 2018-02-02 — End: 2018-02-02
  Administered 2018-02-02: 2 via ORAL
  Filled 2018-02-02: qty 2

## 2018-02-02 MED ORDER — CHLORHEXIDINE GLUCONATE 0.12 % MT SOLN: 15.0000 mL | Freq: Two times a day (BID) | OROMUCOSAL | 0 refills | Status: AC

## 2018-02-02 NOTE — Progress Notes (Signed)
Pt called RN in severe pain. Upon assesment, Midline appears to be leaking. PCA pump disconnected and MD notified. IV team called to further assess midline. Verbal orders received from MD Arrien for one time dose Percocet. Will administer and update dayshift RN

## 2018-02-02 NOTE — Progress Notes (Signed)
CSW was consulted by CM Marylene Land was transportation needs for pt.  CSW gave RN [employer] taxi voucher for pt for discharging needs.  CSW signing off.  Budd Palmer LCSWA [phone]

## 2018-02-02 NOTE — Progress Notes (Signed)
PCA D/C'd and 23 mL of morphine 2 mg/mL wasted with Shinita.

## 2018-02-02 NOTE — Progress Notes (Signed)
Pt taken to exit, given taxi voucher and D/C'd home vie [employer] service.

## 2018-02-02 NOTE — Progress Notes (Addendum)
Reather Laurence to be D/C'd Home per MD order.  Discussed with the patient and all questions fully answered.  An After Visit Summary was printed and given to the patient. Patient received prescription.  D/c education completed with patient/family including follow up instructions, medication list, d/c activities limitations if indicated, with other d/c instructions as indicated by MD - patient able to verbalize understanding, all questions fully answered.   Patient instructed to return to ED, call 911, or call MD for any changes in condition.   Pt waiting on prescription to be signed before being D/C'd  Theodosia Blender 02/02/2018 1:31 PM

## 2018-02-02 NOTE — Discharge Summary (Addendum)
Physician Discharge Summary  [name] ZOX:[id number] DOB: 1986/05/13 DOA: 01/20/2018  PCP: Fleet Contras, MD  Admit date: 01/20/2018 Discharge date: 02/02/2018  Admitted From: Home Disposition:  Home  Recommendations for Outpatient Follow-up and new medication changes:  1. Follow up with PCP in 1- week 2. Patient will continue Augmentin for 7 days 3. Use chlorhexidine rinses twice daily after breakfast and at bedtime. 4.  Use salt water rinses in between the chlorhexidine rinses. 5.  Follow-up with dental medicine in 7-10 days for evaluation for suture removal.   6.  Patient to follow-up with a dentist of his choice for fabrication of upper and lower complete dentures after adequate healing. 7.  Continue pain control with percocet and ibuprofen.    Home Health: No  Equipment/Devices: No  Discharge Condition: Stable  CODE STATUS: Full Diet recommendation: Diabetic prudent.   Brief/Interim Summary: 32 year old male who presented with diabetes ketoacidosis. He does have the significant past medical history for asthma, depression, diabetes mellitus, hypertension, GERD, polysubstance abuse, schizoaffective schizophrenia. In the emergency department he was unresponsive, he required invasive mechanical ventilation. His blood pressure was 131/88, heart rate 77, respiratory rate 14, oxygen saturation 100%. His lungs were clear to auscultation bilaterally, heart S1-S2 present and rhythmic, abdomen was soft nontender, no lower extremity edema.  Sodium 131, potassium greater than 7.5, chloride 95, bicarb less than 7, glucose 1,175, BUN 49, creatinine 3.6.  White count 13.3, hemoglobin 9.1, hematocrit 26.1, platelets 186.  Arterial blood gas 6.73/ 20.2/ 643/ 2.7/ 100%.  Urinalysis negative for infection, glucose 150, negative protein.  Drug screen negative.  Chest x-ray was negative for infiltrates.  EKG sinus rhythm, left axis deviation, poor R wave progression.  Patient was admitted to the  hospital and diagnosis of diabetes ketoacidosis, complicated by acute respiratory failure related to metabolic encephalopathy  1.  Type 1 diabetes mellitus with diabetes ketoacidosis complicated by metabolic encephalopathy and acute CNS related respiratory failure.  Patient was admitted to the intensive care unit, he received IV fluids, IV insulin and frequent electrolyte monitoring.  His anion gap closed, he was successfully transitioned to subcutaneous insulin.  Patient was successfully liberated from mechanical ventilation on February 5, and transition to supplemental oxygen per nasal cannula.  Patient was placed on pre-meal insulin aspart (3 units), basal insulin with glargine (12 units) and insulin sliding scale for glucose coverage and monitoring, his capillary glucose have remained well controlled over the last 48 hours (163, 115, 81, 179, 260).  Patient is tolerating by mouth diet adequately.  Patient was advised to be compliant with his insulin therapy.  Patient will resume his home regimen of insulin at discharge.   2.  Severe chronic periodontitis with suspected acute bacteremia (sepsis/present on admission) complicated by diffuse septic pulmonary embolism.  Patient complained of significant pleuritic chest pain, he underwent CT of the chest, showing small pulmonary nodular lesions throughout the lungs involving all lobes and segments ranging in size from 4 mm to 1.2 cm.  Several of the larger nodular lesions showed cavitation and ill-defined borders, suggesting diffuse septic embolism.  No pulmonary embolus.  His blood cultures remain no growth.  Transesophageal echocardiography showed normal LV systolic function 55-60%, no wall motion normalities, no thrombus or vegetation were identified.  Patient was started on Unasyn.  Source of infection was deemed to be his severe periodontal disease.  Patient was seen by dental surgery, he underwent:  1. Multiple extraction of tooth numbers 2, 3, 5 -12, 14  -16, 17,  19-30, and 32  2. 4 Quadrants of alveoloplasty 3.  Bilateral mandibular lingual tori reductions  Antibiotic therapy was changed to Augmentin, to complete 14 days, he will need 7 more days as an outpatient.  3.  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  Patient showed dysphonia, he underwent flexible fiberoptic laryngoscopy, found to have edematous and ecchymotic right vocal cord, both cords were mobile.  Patient was treated with antiacid therapy, voice rest and nonsteroidal anti-inflammatory therapy.  4.  Hypertension.  Blood pressure remained well controlled.  Patient is off antihypertensive agents.   5.  Acute kidney injury complicated with severe anion gap metabolic acidosis.  Patient responded well to supportive medical therapy, he received IV fluids, his electrolytes were corrected, no signs of volume overload, his discharge creatinine is 0.81, serum sodium 137, potassium 4.7 and bicarbonate of 25.   6.  Schizoaffective disorder.  Patient had episodes of confusion and agitation, but at time of discharge he is awake alert and cooperative.  Resume sertraline and doxepin at discharge    Discharge Diagnoses:  Principal Problem:   Altered mental status Active Problems:   DKA (diabetic ketoacidoses) ([name])   Respiratory failure ([name])   Endotracheally intubated   Increased anion gap metabolic acidosis   Pressure injury of skin   Ankle ulcer ([name])   Lung nodules   Malnutrition of moderate degree   Retained dental root   Torus mandibularis    Discharge Instructions   Allergies as of 02/02/2018      Reactions   Sulfa Antibiotics Other (See Comments)   UNSPECIFIED REACTION of CHILDHOOD   Fentanyl Other (See Comments)   HALLUCINATIONS      Medication List    TAKE these medications   amoxicillin-clavulanate 875-125 MG tablet Commonly known as:  AUGMENTIN Take 1 tablet by mouth every 12 (twelve) hours for 7 days.   amphetamine-dextroamphetamine 30 MG tablet Commonly known as:   ADDERALL Take 30 mg daily by mouth.   chlorhexidine 0.12 % solution Commonly known as:  PERIDEX 15 mLs by Mouth Rinse route 2 (two) times daily.   doxepin 10 MG/ML solution Commonly known as:  SINEQUAN Take 10 mg by mouth at bedtime.   feeding supplement (GLUCERNA SHAKE) Liqd Take 237 mLs by mouth 2 (two) times daily between meals.   ibuprofen 400 MG tablet Commonly known as:  ADVIL,MOTRIN Take 1 tablet (400 mg total) by mouth every 8 (eight) hours as needed for moderate pain.   insulin aspart 100 UNIT/ML FlexPen Commonly known as:  NOVOLOG FLEXPEN Inject 0-15 Units into the skin See admin instructions. Check Blood Sugar 4 times per day > with meals and at bedtime CBG 70 - 120: 0 units CBG 121 - 150: 2 units CBG 151 - 200: 3 units CBG 201 - 250: 5 units CBG 251 - 300: 8 units CBG 301 - 350: 11 units CBG 351 - 400: 15 units   insulin aspart 100 UNIT/ML injection Commonly known as:  NOVOLOG Take 6 units three times daily just before meals.   insulin detemir 100 UNIT/ML injection Commonly known as:  LEVEMIR Inject 0.18 mLs (18 Units total) into the skin 2 (two) times daily.   metoCLOPramide 10 MG tablet Commonly known as:  REGLAN Take 1 tablet (10 mg total) by mouth 3 (three) times daily as needed for nausea or vomiting.   mupirocin cream 2 % Commonly known as:  BACTROBAN Apply topically daily. Apply Bactroban to left heel wound Q day, then cover with foam dressing.  (  Change foam dressing Q 3 days or PRN soiling.)   nicotine 14 mg/24hr patch Commonly known as:  NICODERM CQ - dosed in mg/24 hours Place 1 patch (14 mg total) onto the skin daily.   oxyCODONE-acetaminophen 5-325 MG tablet Commonly known as:  PERCOCET/ROXICET Take 1 tablet by mouth every 6 (six) hours as needed for severe pain.   pantoprazole 40 MG tablet Commonly known as:  PROTONIX Take 1 tablet (40 mg total) by mouth 2 (two) times daily before a meal.   sennosides-docusate sodium 8.6-50 MG  tablet Commonly known as:  SENOKOT-S Take 1 tablet by mouth as needed for constipation.   traZODone 150 MG tablet Commonly known as:  DESYREL Take 150 mg by mouth at bedtime.      Follow-up Information    [facility] Follow up.   Specialty:  Home Health Services Why:  [facility] and PT has been suggested and will be resumed by above agency. A rep from this agency will call within 24-48 hours of discharge to arrange initial visit Contact information: [address] [place] [address] [phone]        Charlynne Pander, DDS. Schedule an appointment as soon as possible for a visit on 02/11/2018.   Specialty:  Dentistry Why:  For suture removal Contact information: [address] [place] [address] [phone]          Allergies  Allergen Reactions  . Sulfa Antibiotics Other (See Comments)    UNSPECIFIED REACTION of CHILDHOOD  . Fentanyl Other (See Comments)    HALLUCINATIONS    Consultations:  ID  ENT   Procedures/Studies: 1. Multiple extraction of tooth numbers 2, 3, 5 -12, 14 -16, 17, 19-30, and 32  2. 4 Quadrants of alveoloplasty 3.  Bilateral mandibular lingual tori reductions    Subjective: Patient is feeling better, no nausea or vomiting, tolerating po well. Pain is controlled with percocet and ibuprofen.   Discharge Exam: Vitals:   02/02/18 0437 02/02/18 0531  BP:  100/60  Pulse:  (!) 106  Resp: 18 16  Temp:  97.8 F (36.6 C)  SpO2: 98% 98%   Vitals:   02/01/18 2128 02/02/18 0015 02/02/18 0437 02/02/18 0531  BP: 117/66   100/60  Pulse: (!) 113   (!) 106  Resp: 16 18 18 16   Temp: 99.1 F (37.3 C)   97.8 F (36.6 C)  TempSrc: Oral   Oral  SpO2: 98% 100% 98% 98%  Weight:      Height:        General: Not in pain or dyspnea Neurology: Awake and alert, non focal  E ENT: no pallor, no icterus, oral mucosa moist Cardiovascular: No JVD. S1-S2 present, rhythmic, no gallops, rubs, or murmurs. No lower  extremity edema. Pulmonary: vesicular breath sounds bilaterally, adequate air movement, no wheezing, rhonchi or rales. Gastrointestinal. Abdomen flat, no organomegaly, non tender, no rebound or guarding Skin. Bilateral heal ulcers.  Musculoskeletal: no joint deformities   The results of significant diagnostics from this hospitalization (including imaging, microbiology, ancillary and laboratory) are listed below for reference.     Microbiology: Recent Results (from the past 240 hour(s))  Culture, blood (routine x 2)     Status: None   Collection Time: 01/24/18 10:02 PM  Result Value Ref Range Status   Specimen Description BLOOD RIGHT ANTECUBITAL  Final   Special Requests AEROBIC BOTTLE ONLY Blood Culture adequate volume  Final   Culture   Final  NO GROWTH 5 DAYS Performed at [facility], [address], [place], [place] [address]    Report Status 01/30/2018 FINAL  Final  Culture, blood (routine x 2)     Status: None   Collection Time: 01/24/18 10:12 PM  Result Value Ref Range Status   Specimen Description BLOOD RIGHT HAND  Final   Special Requests AEROBIC BOTTLE ONLY Blood Culture adequate volume  Final   Culture   Final    NO GROWTH 5 DAYS Performed at [facility], [address], [place], [place] [address]    Report Status 01/30/2018 FINAL  Final  Surgical pcr screen     Status: None   Collection Time: 01/31/18 12:08 AM  Result Value Ref Range Status   MRSA, PCR NEGATIVE NEGATIVE Final   Staphylococcus aureus NEGATIVE NEGATIVE Final    Comment: (NOTE) The Xpert SA Assay (FDA approved for NASAL specimens in patients 32 years of age and older), is one component of a comprehensive surveillance program. It is not intended to diagnose infection nor to guide or monitor treatment. Performed at [facility], [address], [place], [place] [address]      Labs: BNP (last 3 results) No results for input(s): BNP in the last 8760 hours. Basic Metabolic  Panel: Recent Labs  Lab 01/27/18 0215 01/28/18 0440 02/01/18 0434 02/01/18 1340 02/02/18 0541  NA 140 143  --  138 135  K 3.9 4.4  --  5.0 4.7  CL 103 104  --  102 97*  CO2 26 27  --  24 25  GLUCOSE 47* 153* 187* 117* 261*  BUN 11 13  --  23* 21*  CREATININE 0.87 1.04  --  0.67 0.81  CALCIUM 8.4* 8.0*  --  9.0 8.8*   Liver Function Tests: No results for input(s): AST, ALT, ALKPHOS, BILITOT, PROT, ALBUMIN in the last 168 hours. No results for input(s): LIPASE, AMYLASE in the last 168 hours. No results for input(s): AMMONIA in the last 168 hours. CBC: Recent Labs  Lab 01/27/18 0215 01/28/18 0440  WBC 5.3 4.1  HGB 8.7* 7.2*  HCT 26.5* 22.7*  MCV 90.1 89.7  PLT 217 170   Cardiac Enzymes: No results for input(s): CKTOTAL, CKMB, CKMBINDEX, TROPONINI in the last 168 hours. BNP: Invalid input(s): POCBNP CBG: Recent Labs  Lab 02/01/18 0736 02/01/18 1225 02/01/18 1657 02/01/18 2130 02/02/18 0800  GLUCAP 163* 115* 81 179* 260*   D-Dimer No results for input(s): DDIMER in the last 72 hours. Hgb A1c No results for input(s): HGBA1C in the last 72 hours. Lipid Profile No results for input(s): CHOL, HDL, LDLCALC, TRIG, CHOLHDL, LDLDIRECT in the last 72 hours. Thyroid function studies No results for input(s): TSH, T4TOTAL, T3FREE, THYROIDAB in the last 72 hours.  Invalid input(s): FREET3 Anemia work up No results for input(s): VITAMINB12, FOLATE, FERRITIN, TIBC, IRON, RETICCTPCT in the last 72 hours. Urinalysis    Component Value Date/Time   COLORURINE STRAW (A) 01/25/2018 0609   APPEARANCEUR CLEAR 01/25/2018 0609   LABSPEC 1.010 01/25/2018 0609   PHURINE 7.0 01/25/2018 0609   GLUCOSEU 150 (A) 01/25/2018 0609   HGBUR NEGATIVE 01/25/2018 0609   BILIRUBINUR NEGATIVE 01/25/2018 0609   BILIRUBINUR negative 03/29/2015 1114   KETONESUR NEGATIVE 01/25/2018 0609   PROTEINUR NEGATIVE 01/25/2018 0609   UROBILINOGEN 0.2 05/17/2015 1810   NITRITE NEGATIVE 01/25/2018 0609    LEUKOCYTESUR NEGATIVE 01/25/2018 0609   Sepsis Labs Invalid input(s): PROCALCITONIN,  WBC,  LACTICIDVEN Microbiology Recent Results (from the past 240 hour(s))  Culture, blood (routine x 2)     Status: None   Collection Time: 01/24/18 10:02 PM  Result Value Ref Range Status   Specimen Description BLOOD RIGHT ANTECUBITAL  Final   Special Requests AEROBIC BOTTLE ONLY Blood Culture adequate volume  Final   Culture   Final    NO GROWTH 5 DAYS Performed at [facility], [address] [place], [place] [address]    Report Status 01/30/2018 FINAL  Final  Culture, blood (routine x 2)     Status: None   Collection Time: 01/24/18 10:12 PM  Result Value Ref Range Status   Specimen Description BLOOD RIGHT HAND  Final   Special Requests AEROBIC BOTTLE ONLY Blood Culture adequate volume  Final   Culture   Final    NO GROWTH 5 DAYS Performed at [facility], [address], [place], [place] [address]    Report Status 01/30/2018 FINAL  Final  Surgical pcr screen     Status: None   Collection Time: 01/31/18 12:08 AM  Result Value Ref Range Status   MRSA, PCR NEGATIVE NEGATIVE Final   Staphylococcus aureus NEGATIVE NEGATIVE Final    Comment: (NOTE) The Xpert SA Assay (FDA approved for NASAL specimens in patients 3 years of age and older), is one component of a comprehensive surveillance program. It is not intended to diagnose infection nor to guide or monitor treatment. Performed at [facility], [address], [place], [place] [address]      Time coordinating discharge: 45 minutes  SIGNED:   Coralie Keens, MD  [facility] 02/02/2018, 9:25 AM Pager [phone]  If 7PM-7AM, please contact night-coverage [email or web address] Password [email or web address]

## 2018-02-02 NOTE — Care Management Note (Addendum)
Case Management Note  Patient Details  Name: [name] MRN: [id number] Date of Birth: 06-11-1986  Subjective/Objective:    AMS/DKA               S/P Extraction of tooth #'s 2,3,5-12, 14-16, 17, 19-30 and 32 with alveoloplasty and bilateral mandibular lingaul tori reductions    Action/Plan: Transition to home with home health services to follow.   Expected Discharge Date:  02/02/18               Expected Discharge Plan:  Home w Home Health Services(roommate, Leonette Most)  In-House Referral:   CSW, taxi voucher  Discharge planning Services  CM Consult  Post Acute Care Choice:  Home Health Choice offered to:  Patient  DME Arranged:  (states does not need DME) DME Agency:  [facility]  HH Arranged:  RN,SW [facility] Agency:  [facility]  Status of Service:  Completed, signed off  If discussed at Long Length of Stay Meetings, dates discussed:    Additional Comments:  Epifanio Lesches, RN 02/02/2018, 1:57 PM

## 2018-02-04 ENCOUNTER — Telehealth (HOSPITAL_COMMUNITY): Payer: Self-pay | Admitting: Dentistry

## 2018-02-04 DIAGNOSIS — K0889 Other specified disorders of teeth and supporting structures: Secondary | ICD-10-CM

## 2018-02-04 DIAGNOSIS — K0820 Unspecified atrophy of edentulous alveolar ridge: Secondary | ICD-10-CM

## 2018-02-04 DIAGNOSIS — K08109 Complete loss of teeth, unspecified cause, unspecified class: Secondary | ICD-10-CM

## 2018-02-04 LAB — BPAM RBC
Blood Product Expiration Date: 201903112359
Blood Product Expiration Date: 201903112359
ISSUE DATE / TIME: 201902102314
UNIT TYPE AND RH: 5100
UNIT TYPE AND RH: 5100

## 2018-02-04 LAB — TYPE AND SCREEN
ABO/RH(D): O POS
ANTIBODY SCREEN: NEGATIVE
UNIT DIVISION: 0
UNIT DIVISION: 0

## 2018-02-04 NOTE — Telephone Encounter (Signed)
02/04/2018  Patient:            [name] Date of Birth:  07/17/1986 MRN:                [id number]   Dental medicine has made multiple attempts to contact the patient and schedule a postop evaluation and suture removal. There is no answering machine.  Patient is to contact Dental Medicine for follow-up evaluation of healing as per his discharge summary instructions.   Dr. Kristin Bruins

## 2018-02-14 ENCOUNTER — Emergency Department (HOSPITAL_COMMUNITY)
Admission: EM | Admit: 2018-02-14 | Discharge: 2018-02-14 | Disposition: A | Discharge status: Home / Self Care | Payer: Medicare Other | Attending: Emergency Medicine | Admitting: Emergency Medicine

## 2018-02-14 ENCOUNTER — Other Ambulatory Visit: Payer: Self-pay

## 2018-02-14 ENCOUNTER — Encounter (HOSPITAL_COMMUNITY): Payer: Self-pay

## 2018-02-14 DIAGNOSIS — Z79899 Other long term (current) drug therapy: Secondary | ICD-10-CM | POA: Insufficient documentation

## 2018-02-14 DIAGNOSIS — K0889 Other specified disorders of teeth and supporting structures: Secondary | ICD-10-CM | POA: Diagnosis present

## 2018-02-14 DIAGNOSIS — I10 Essential (primary) hypertension: Secondary | ICD-10-CM | POA: Insufficient documentation

## 2018-02-14 DIAGNOSIS — E1065 Type 1 diabetes mellitus with hyperglycemia: Secondary | ICD-10-CM | POA: Diagnosis not present

## 2018-02-14 DIAGNOSIS — R519 Headache, unspecified: Secondary | ICD-10-CM

## 2018-02-14 DIAGNOSIS — Z9114 Patient's other noncompliance with medication regimen: Secondary | ICD-10-CM | POA: Diagnosis not present

## 2018-02-14 DIAGNOSIS — R51 Headache: Secondary | ICD-10-CM

## 2018-02-14 DIAGNOSIS — F17210 Nicotine dependence, cigarettes, uncomplicated: Secondary | ICD-10-CM | POA: Diagnosis not present

## 2018-02-14 DIAGNOSIS — R739 Hyperglycemia, unspecified: Secondary | ICD-10-CM

## 2018-02-14 LAB — BASIC METABOLIC PANEL
Anion gap: 14 (ref 5–15)
BUN: 10 mg/dL (ref 6–20)
CALCIUM: 8.7 mg/dL — AB (ref 8.9–10.3)
CO2: 24 mmol/L (ref 22–32)
CREATININE: 1.05 mg/dL (ref 0.61–1.24)
Chloride: 90 mmol/L — ABNORMAL LOW (ref 101–111)
Glucose, Bld: 860 mg/dL (ref 65–99)
Potassium: 4.3 mmol/L (ref 3.5–5.1)
SODIUM: 128 mmol/L — AB (ref 135–145)

## 2018-02-14 LAB — URINALYSIS, ROUTINE W REFLEX MICROSCOPIC
BILIRUBIN URINE: NEGATIVE
Glucose, UA: >=500 mg/dL — AB
Hgb urine dipstick: NEGATIVE
Ketones, ur: NEGATIVE mg/dL
Leukocytes, UA: NEGATIVE
NITRITE: NEGATIVE
PH: 6 (ref 5.0–8.0)
Protein, ur: NEGATIVE mg/dL
SPECIFIC GRAVITY, URINE: 1.026 (ref 1.005–1.030)

## 2018-02-14 LAB — I-STAT CG4 LACTIC ACID, ED
LACTIC ACID, VENOUS: 2.81 mmol/L — AB (ref 0.5–1.9)
LACTIC ACID, VENOUS: 1.77 mmol/L (ref 0.5–1.9)
Lactic Acid, Venous: 3.3 mmol/L (ref 0.5–1.9)

## 2018-02-14 LAB — CBC
HCT: 31.3 % — ABNORMAL LOW (ref 39.0–52.0)
Hemoglobin: 9.5 g/dL — ABNORMAL LOW (ref 13.0–17.0)
MCH: 27.9 pg (ref 26.0–34.0)
MCHC: 30.4 g/dL (ref 30.0–36.0)
MCV: 91.8 fL (ref 78.0–100.0)
PLATELETS: 424 10*3/uL — AB (ref 150–400)
RBC: 3.41 MIL/uL — AB (ref 4.22–5.81)
RDW: 13.7 % (ref 11.5–15.5)
WBC: 11.6 10*3/uL — AB (ref 4.0–10.5)

## 2018-02-14 LAB — CBG MONITORING, ED: Glucose-Capillary: >600 mg/dL (ref 65–99)

## 2018-02-14 MED ORDER — KETOROLAC TROMETHAMINE 30 MG/ML IJ SOLN
30.0000 mg | Freq: Once | INTRAMUSCULAR | Status: AC
  Administered 2018-02-14: 30 mg via INTRAVENOUS
  Filled 2018-02-14: qty 1

## 2018-02-14 MED ORDER — DIPHENHYDRAMINE HCL 50 MG/ML IJ SOLN
12.5000 mg | Freq: Once | INTRAMUSCULAR | Status: AC
  Administered 2018-02-14: 12.5 mg via INTRAVENOUS
  Filled 2018-02-14: qty 1

## 2018-02-14 MED ORDER — PROCHLORPERAZINE EDISYLATE 5 MG/ML IJ SOLN
10.0000 mg | Freq: Once | INTRAMUSCULAR | Status: AC
  Administered 2018-02-14: 10 mg via INTRAVENOUS
  Filled 2018-02-14: qty 2

## 2018-02-14 MED ORDER — ACETAMINOPHEN 325 MG PO TABS
650.0000 mg | ORAL_TABLET | Freq: Once | ORAL | Status: AC | PRN
  Administered 2018-02-14: 650 mg via ORAL
  Filled 2018-02-14: qty 2

## 2018-02-14 MED ORDER — INSULIN ASPART 100 UNIT/ML ~~LOC~~ SOLN
10.0000 [IU] | Freq: Once | SUBCUTANEOUS | Status: AC
  Administered 2018-02-14: 10 [IU] via SUBCUTANEOUS
  Filled 2018-02-14: qty 1

## 2018-02-14 MED ORDER — SODIUM CHLORIDE 0.9 % IV BOLUS (SEPSIS)
2000.0000 mL | Freq: Once | INTRAVENOUS | Status: AC
  Administered 2018-02-14: 2000 mL via INTRAVENOUS

## 2018-02-14 NOTE — ED Triage Notes (Signed)
Pt presents to ED with c/o oral pain from having all teeth pulled on the 16th of Feb. Pt states he can feel metal in his mouth. No visible stitches. Pt also c/o trouble swallowing. Came in with large drink and Mindi Slicker. Pt reports having recent fall and not having any energy.

## 2018-02-14 NOTE — ED Notes (Signed)
Pt reports that he was given antibiotics for having teeth pulled but did not take them

## 2018-02-14 NOTE — ED Notes (Signed)
Writer notified PA Hedges of abnormal I stat lactic of 2.81

## 2018-02-14 NOTE — ED Notes (Signed)
Encourage pt to stop eating the food he brought in with him until he speaks with a doctor.

## 2018-02-14 NOTE — ED Provider Notes (Signed)
[facility] EMERGENCY DEPARTMENT Provider Note   CSN: [id number] Arrival date & time: 02/14/18  1645     History   Chief Complaint Chief Complaint  Patient presents with  . Dental Pain    pt had all teeth pulled on 16th c/o oral pain   . Hyperglycemia    HPI [name] is a 32 y.o. male.  HPI  32 year old male history of diabetes, hypertension, polysubstance abuse, schizoaffective schizophrenia, recent discharge from the hospital on February 16 with diagnosis of DKA, severe chronic periodontitis suspected acute bacteremia from the dental infections complicated with diffuse septic pulmonary embolism.  Patient had multiple extraction of teeth and had been discharged with regimen of Augmentin to be taken times 7 days that he had not taken.  Today patient presents the emergency department with sensation that there is some a retained sutures along the surgical sites, dry mouth, irritation of throat, generalized weakness, syncopal episode yesterday x1 with no additional injuries.  Denies fever.  Tolerates po solids/fluids.  Denies abdominal pain, nausea/vomiting or diarrhea.  Past Medical History:  Diagnosis Date  . Anemia   . Anxiety   . Arthritis    "both hips; both shoulders" (11/05/2017)  . Chicken pox   . Childhood asthma   . Chronic pain   . Depression   . DKA (diabetic ketoacidoses) ([name]) 11/18/2014  . DKA (diabetic ketoacidoses) ([name]) 07/05/2017  . GERD (gastroesophageal reflux disease)   . Hypertension   . Migraine    "a few/year" (06/06/2017)  . Noncompliance with medication regimen   . Pneumonia    "several times" (06/06/2017)  . Polysubstance abuse ([name])   . Schizo affective schizophrenia ([name])   . Scoliosis   . Type I diabetes mellitus [facility])     Patient Active Problem List   Diagnosis Date Noted  . Retained dental root 01/31/2018  . 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 Malnutrition of moderate degree 01/29/2018  . Ankle ulcer ([name])   .  Lung nodules   . Altered mental status   . Pressure injury of skin 01/22/2018  . Endotracheally intubated   . Increased anion gap metabolic acidosis   . Metabolic encephalopathy   . Vision loss 12/10/2017  . DKA (diabetic ketoacidosis) ([name]) 11/03/2017  . Pressure injury of contiguous region involving back and buttock, stage 1 10/11/2017  . Encephalopathy acute   . Hyperbilirubinemia 09/14/2017  . Heel ulcer ([name]) 09/14/2017  . Noncompliance with medication regimen   . Hypokalemia   . Non healing left heel wound   . Cellulitis   . DKA, type 1 ([name]) 08/17/2017  . Encounter for imaging study to confirm orogastric (OG) tube placement   . Acute respiratory acidosis   . DKA (diabetic ketoacidoses) ([name]) 08/04/2017  . Respiratory failure ([name])   . Seizure ([name])   . Diabetic ketoacidosis ([name]) 07/30/2017  . Normocytic anemia 07/05/2017  . Numbness of right hand 07/05/2017  . Elevated troponin I measurement   . Positive D dimer   . DKA, type 1, not at goal [facility]) 07/01/2017  . Hyperkalemia   . Acute kidney injury ([name])   . Medically noncompliant   . Hyperglycemia 06/16/2017  . Nausea & vomiting 06/15/2017  . Abdominal pain 06/15/2017  . Sepsis ([name]) 06/07/2017  . Chest pain 06/07/2017  . GERD (gastroesophageal reflux disease) 06/07/2017  . Substance abuse ([name]) 05/30/2016  . Substance induced mood disorder ([name]) 05/30/2016  . Tobacco use disorder 12/24/2015  . DM hyperosmolarity type I, uncontrolled (  [name]) 12/19/2015  . Generalized headache 07/22/2015  . Bilateral hip bursitis 05/26/2015  . Depression   . Generalized anxiety disorder 03/29/2015  . Undifferentiated schizophrenia ([name])   . Hip pain, bilateral 11/18/2014  . Dehydration 06/17/2012    Past Surgical History:  Procedure Laterality Date  . CARDIAC CATHETERIZATION  11/2016  . INCISION AND DRAINAGE ABSCESS Left 11/2011   "MRSA removed off my thumb"  . MULTIPLE EXTRACTIONS WITH ALVEOLOPLASTY N/A 01/31/2018    Procedure: Extraction of tooth #'s 2,3,5-12, 14-16, 17, 19-30 and 32 with alveoloplasty and bilateral mandibular lingaul tori reductions.;  Surgeon: Charlynne Pander, DDS;  Location: [facility] OR;  Service: Oral Surgery;  Laterality: N/A;  . TEE WITHOUT CARDIOVERSION N/A 01/28/2018   Procedure: TRANSESOPHAGEAL ECHOCARDIOGRAM (TEE);  Surgeon: Jake Bathe, MD;  Location: [facility] ENDOSCOPY;  Service: Cardiovascular;  Laterality: N/A;       Home Medications    Prior to Admission medications   Medication Sig Start Date End Date Taking? Authorizing Provider  amphetamine-dextroamphetamine (ADDERALL) 30 MG tablet Take 30 mg daily by mouth. 10/02/17  Yes [provider]  doxepin (SINEQUAN) 10 MG/ML solution Take 100 mg by mouth at bedtime.  07/11/17  Yes [provider]  insulin aspart (NOVOLOG FLEXPEN) 100 UNIT/ML FlexPen Inject 0-15 Units into the skin See admin instructions. Check Blood Sugar 4 times per day > with meals and at bedtime CBG 70 - 120: 0 units CBG 121 - 150: 2 units CBG 151 - 200: 3 units CBG 201 - 250: 5 units CBG 251 - 300: 8 units CBG 301 - 350: 11 units CBG 351 - 400: 15 units 12/12/17  Yes Rai, Ripudeep K, MD  Insulin Glargine (TOUJEO SOLOSTAR) 300 UNIT/ML SOPN Inject 30 units of lipase into the skin daily.   Yes [provider]  oxyCODONE-acetaminophen (PERCOCET/ROXICET) 5-325 MG tablet Take 1 tablet by mouth every 6 (six) hours as needed for severe pain. 02/02/18  Yes Arrien, York Ram, MD  pantoprazole (PROTONIX) 40 MG tablet Take 1 tablet (40 mg total) by mouth 2 (two) times daily before a meal. 12/12/17  Yes Rai, Ripudeep K, MD  traZODone (DESYREL) 150 MG tablet Take 150 mg by mouth at bedtime. 06/26/17  Yes [provider]  chlorhexidine (PERIDEX) 0.12 % solution 15 mLs by Mouth Rinse route 2 (two) times daily. 02/02/18   Arrien, York Ram, MD  feeding supplement, GLUCERNA SHAKE, (GLUCERNA SHAKE) LIQD Take 237 mLs by mouth 2 (two)  times daily between meals. 02/02/18 03/04/18  Arrien, York Ram, MD  ibuprofen (ADVIL,MOTRIN) 400 MG tablet Take 1 tablet (400 mg total) by mouth every 8 (eight) hours as needed for moderate pain. 02/02/18   Arrien, York Ram, MD  insulin aspart (NOVOLOG) 100 UNIT/ML injection Take 6 units three times daily just before meals. 01/17/18 01/17/19  Alwyn Ren, MD  sennosides-docusate sodium (SENOKOT-S) 8.6-50 MG tablet Take 1 tablet by mouth as needed for constipation.    [provider]  sertraline (ZOLOFT) 50 MG tablet Take 50 mg by mouth daily. For depression. Just started med, have not picked up yet [facility] [address] 02/22/12 03/08/12  Viviann Spare, FNP    Family History Family History  Problem Relation Age of Onset  . Diabetes Mother     Social History Social History   Tobacco Use  . Smoking status: Current Every Day Smoker    Packs/day: 0.50    Years: 9.00    Pack years: 4.50  Types: Cigarettes  . Smokeless tobacco: Never Used  Substance Use Topics  . Alcohol use: No  . Drug use: Yes    Types: Marijuana    Comment: 11/05/2017 "none since 2012"; pt denies hx of cocaine and methamphetamines use on 11/05/2017     Allergies   Sulfa antibiotics and Fentanyl   Review of Systems Review of Systems  Review of Systems  Constitutional: Negative for fever and chills.  HENT: Negative for ear pain, sore throat and trouble swallowing.   Eyes: Negative for pain and visual disturbance.  Respiratory: Negative for cough and shortness of breath.   Cardiovascular: Negative for chest pain and leg swelling.  Gastrointestinal: Negative for nausea, vomiting, abdominal pain and diarrhea.  Genitourinary: Negative for dysuria, urgency and frequency.  Musculoskeletal: Negative for back pain and joint swelling.  Skin: Negative for rash and wound.  Neurological: see HPI/where you are going on   Physical Exam Updated Vital Signs BP (!) 134/91   Pulse 89    Temp 98.1 F (36.7 C) (Oral)   Resp 18   Ht 6' (1.829 m)   Wt 61.2 kg (135 lb)   SpO2 95%   BMI 18.31 kg/m   Physical Exam  Physical Exam Vitals:   02/14/18 2111 02/14/18 2112  BP: (!) 134/91 (!) 134/91  Pulse: 89 89  Resp:  18  Temp:    SpO2: 96% 95%   Constitutional: Patient is in no acute distress Head: Normocephalic and atraumatic.  Eyes: Extraocular motion intact, no scleral icterus Neck: Supple without meningismus, mass, or overt JVD Mouth: intraoral dry mucosa; neg evidence of retained sutures/neg erythema/drainage; neg facial asymetry  Respiratory: Effort normal and breath sounds normal. No respiratory distress. [id number],  no obvious murmurs.  Pulses +2 and symmetric Abdomen: Soft, non-tender, non-distended MSK: Extremities are atraumatic without deformity, ROM intact Skin: dry mucosa Neuro: Alert and oriented, no motor deficit noted Psychiatric: Mood and affect are normal.  ED Treatments / Results  Labs (all labs ordered are listed, but only abnormal results are displayed) Labs Reviewed  BASIC METABOLIC PANEL - Abnormal; Notable for the following components:      Result Value   Sodium 128 (*)    Chloride 90 (*)    Glucose, Bld 860 (*)    Calcium 8.7 (*)    All other components within normal limits  CBC - Abnormal; Notable for the following components:   WBC 11.6 (*)    RBC 3.41 (*)    Hemoglobin 9.5 (*)    HCT 31.3 (*)    Platelets 424 (*)    All other components within normal limits  URINALYSIS, ROUTINE W REFLEX MICROSCOPIC - Abnormal; Notable for the following components:   Glucose, UA >=500 (*)    Bacteria, UA RARE (*)    Squamous Epithelial / LPF 0-5 (*)    All other components within normal limits  CBG MONITORING, ED - Abnormal; Notable for the following components:   Glucose-Capillary >600 (*)    All other components within normal limits  CBG MONITORING, ED - Abnormal; Notable for the following components:   Glucose-Capillary >600 (*)      All other components within normal limits  I-STAT CG4 LACTIC ACID, ED - Abnormal; Notable for the following components:   Lactic Acid, Venous 2.81 (*)    All other components within normal limits  I-STAT CG4 LACTIC ACID, ED - Abnormal; Notable for the following components:   Lactic Acid, Venous 3.30 (*)    All  other components within normal limits  I-STAT CG4 LACTIC ACID, ED  CBG MONITORING, ED    EKG  EKG Interpretation None       Radiology No results found.  Procedures Procedures (including critical care time)  Medications Ordered in ED Medications  insulin aspart (novoLOG) injection 10 Units (not administered)  acetaminophen (TYLENOL) tablet 650 mg (650 mg Oral Given 02/14/18 1752)  sodium chloride 0.9 % bolus 2,000 mL (0 mLs Intravenous Stopped 02/14/18 2229)  prochlorperazine (COMPAZINE) injection 10 mg (10 mg Intravenous Given 02/14/18 2053)  diphenhydrAMINE (BENADRYL) injection 12.5 mg (12.5 mg Intravenous Given 02/14/18 2047)  ketorolac (TORADOL) 30 MG/ML injection 30 mg (30 mg Intravenous Given 02/14/18 2046)     Initial Impression / Assessment and Plan / ED Course  I have reviewed the triage vital signs and the nursing notes.  Pertinent labs & imaging results that were available during my care of the patient were reviewed by me and considered in my medical decision making (see chart for details).     32 year old male history of diabetes, hypertension, polysubstance abuse, schizoaffective schizophrenia, recent discharge from the hospital on February 16 with diagnosis of DKA, severe chronic periodontitis suspected acute bacteremia from the dental infections complicated with diffuse septic pulmonary embolism.  Patient had multiple extraction of teeth and had been discharged with regimen of Augmentin to be taken times 7 days that he had not taken.  Today patient presents the emergency department with sensation that there is some a retained sutures along the surgical  sites, dry mouth, irritation of throat, generalized weakness, syncopal episode yesterday x1 with no additional injuries.  Denies fever.  Tolerates po solids/fluids.  Denies abdominal pain, nausea/vomiting or diarrhea.  Patient arrives tachycardic to 127 otherwise afebrile dehydrated appearing.  800s, negative widening of anion gap, mild leukocytosis stable H&H with a slightly elevated lactic acid.  Patient was complaining about subjective headache given Compazine/Benadryl/Toradol in the emergency department along with 2 L normal saline bolus.   post fluid resuscitation patient has an overall appearance of improvement along with tachycardia this resolved.  Patient still has persistent elevated blood sugar around 600s.  10 units of subcu insulin given in the emergency department and repeat lactic acid 1.77 improved.   Pt tolerates po, was eating [employer] meal/soft drink in ED.   Of note patient refused ABG lab  Doubt DKA, doubt sepsis/infection, suspect hyperglycemia/dehydration.      Final Clinical Impressions(s) / ED Diagnoses   Final diagnoses:  Hyperglycemia    ED Discharge Orders    None       Jaynie Collins, DO 02/14/18 2318    Jacalyn Lefevre, MD 02/14/18 2330

## 2018-02-14 NOTE — ED Notes (Signed)
Pt refused arterial  Blood gas from respiratory.

## 2018-02-14 NOTE — ED Notes (Signed)
Pt walking to room eating Mindi Slicker

## 2018-02-14 NOTE — Progress Notes (Signed)
Patient refused ABG at this time, Stated he knows it hurts too bad and he doesn't want one." Explained that if anything would change we may have to just get one at a later time. MD and RN aware

## 2018-02-16 ENCOUNTER — Encounter (HOSPITAL_COMMUNITY): Payer: Self-pay | Admitting: Emergency Medicine

## 2018-02-16 ENCOUNTER — Emergency Department (HOSPITAL_COMMUNITY)
Admission: EM | Admit: 2018-02-16 | Discharge: 2018-02-17 | Disposition: A | Discharge status: Other Acute Inpatient Hospital | Payer: Medicare Other | Attending: Emergency Medicine | Admitting: Emergency Medicine

## 2018-02-16 DIAGNOSIS — F419 Anxiety disorder, unspecified: Secondary | ICD-10-CM | POA: Insufficient documentation

## 2018-02-16 DIAGNOSIS — Z794 Long term (current) use of insulin: Secondary | ICD-10-CM | POA: Insufficient documentation

## 2018-02-16 DIAGNOSIS — Z79899 Other long term (current) drug therapy: Secondary | ICD-10-CM | POA: Diagnosis not present

## 2018-02-16 DIAGNOSIS — E109 Type 1 diabetes mellitus without complications: Secondary | ICD-10-CM | POA: Diagnosis not present

## 2018-02-16 DIAGNOSIS — Z733 Stress, not elsewhere classified: Secondary | ICD-10-CM | POA: Insufficient documentation

## 2018-02-16 DIAGNOSIS — F29 Unspecified psychosis not due to a substance or known physiological condition: Secondary | ICD-10-CM

## 2018-02-16 DIAGNOSIS — J45909 Unspecified asthma, uncomplicated: Secondary | ICD-10-CM | POA: Diagnosis not present

## 2018-02-16 DIAGNOSIS — I10 Essential (primary) hypertension: Secondary | ICD-10-CM | POA: Insufficient documentation

## 2018-02-16 DIAGNOSIS — F1210 Cannabis abuse, uncomplicated: Secondary | ICD-10-CM | POA: Diagnosis not present

## 2018-02-16 DIAGNOSIS — F250 Schizoaffective disorder, bipolar type: Secondary | ICD-10-CM | POA: Insufficient documentation

## 2018-02-16 DIAGNOSIS — F17210 Nicotine dependence, cigarettes, uncomplicated: Secondary | ICD-10-CM | POA: Diagnosis not present

## 2018-02-16 DIAGNOSIS — Z0489 Encounter for examination and observation for other specified reasons: Secondary | ICD-10-CM | POA: Diagnosis present

## 2018-02-16 LAB — CBC
HCT: 28.4 % — ABNORMAL LOW (ref 39.0–52.0)
Hemoglobin: 9.6 g/dL — ABNORMAL LOW (ref 13.0–17.0)
MCH: 28.9 pg (ref 26.0–34.0)
MCHC: 33.8 g/dL (ref 30.0–36.0)
MCV: 85.5 fL (ref 78.0–100.0)
Platelets: 445 10*3/uL — ABNORMAL HIGH (ref 150–400)
RBC: 3.32 MIL/uL — AB (ref 4.22–5.81)
RDW: 13.4 % (ref 11.5–15.5)
WBC: 15.5 10*3/uL — AB (ref 4.0–10.5)

## 2018-02-16 LAB — COMPREHENSIVE METABOLIC PANEL
ALBUMIN: 3.1 g/dL — AB (ref 3.5–5.0)
ALT: 9 U/L — AB (ref 17–63)
AST: 15 U/L (ref 15–41)
Alkaline Phosphatase: 116 U/L (ref 38–126)
Anion gap: 15 (ref 5–15)
BUN: 7 mg/dL (ref 6–20)
CHLORIDE: 98 mmol/L — AB (ref 101–111)
CO2: 23 mmol/L (ref 22–32)
CREATININE: 0.73 mg/dL (ref 0.61–1.24)
Calcium: 8.9 mg/dL (ref 8.9–10.3)
GFR calc non Af Amer: >60 mL/min (ref >60)
GLUCOSE: 158 mg/dL — AB (ref 65–99)
Potassium: 3.4 mmol/L — ABNORMAL LOW (ref 3.5–5.1)
SODIUM: 136 mmol/L (ref 135–145)
Total Bilirubin: 0.3 mg/dL (ref 0.3–1.2)
Total Protein: 6.9 g/dL (ref 6.5–8.1)

## 2018-02-16 LAB — RAPID URINE DRUG SCREEN, HOSP PERFORMED
AMPHETAMINES: NOT DETECTED
BENZODIAZEPINES: NOT DETECTED
Barbiturates: NOT DETECTED
Cocaine: NOT DETECTED
Opiates: NOT DETECTED
Tetrahydrocannabinol: POSITIVE — AB

## 2018-02-16 LAB — ETHANOL: Alcohol, Ethyl (B): <10 mg/dL (ref <10)

## 2018-02-16 NOTE — ED Triage Notes (Addendum)
Reports he wants to be admitted to psychiatric facility for stress/anxiety.  Denies having any SI.  During triage c/o difficulty swallowing. (has [employer] in triage).

## 2018-02-17 LAB — CBG MONITORING, ED
GLUCOSE-CAPILLARY: 70 mg/dL (ref 65–99)
GLUCOSE-CAPILLARY: 100 mg/dL — AB (ref 65–99)
GLUCOSE-CAPILLARY: 152 mg/dL — AB (ref 65–99)

## 2018-02-17 MED ORDER — PANTOPRAZOLE SODIUM 40 MG PO TBEC
40.0000 mg | DELAYED_RELEASE_TABLET | Freq: Two times a day (BID) | ORAL | Status: DC
  Filled 2018-02-17: qty 1

## 2018-02-17 MED ORDER — INSULIN GLARGINE 100 UNIT/ML ~~LOC~~ SOLN
30.0000 [IU] | Freq: Every day | SUBCUTANEOUS | Status: DC
  Administered 2018-02-17: 30 [IU] via SUBCUTANEOUS
  Filled 2018-02-17 (×2): qty 0.3

## 2018-02-17 MED ORDER — SENNOSIDES-DOCUSATE SODIUM 8.6-50 MG PO TABS
1.0000 | ORAL_TABLET | ORAL | Status: DC | PRN
  Filled 2018-02-17: qty 1

## 2018-02-17 MED ORDER — INSULIN ASPART 100 UNIT/ML ~~LOC~~ SOLN: 0.0000 [IU] | Freq: Three times a day (TID) | SUBCUTANEOUS | Status: DC

## 2018-02-17 MED ORDER — INSULIN ASPART 100 UNIT/ML FLEXPEN: 0.0000 [IU] | PEN_INJECTOR | SUBCUTANEOUS | Status: DC

## 2018-02-17 MED ORDER — AMPHETAMINE-DEXTROAMPHETAMINE 20 MG PO TABS
30.0000 mg | ORAL_TABLET | ORAL | Status: AC
  Administered 2018-02-17: 30 mg via ORAL
  Filled 2018-02-17: qty 1

## 2018-02-17 MED ORDER — CHLORHEXIDINE GLUCONATE 0.12 % MT SOLN
15.0000 mL | Freq: Two times a day (BID) | OROMUCOSAL | Status: DC
  Filled 2018-02-17 (×4): qty 15

## 2018-02-17 MED ORDER — IBUPROFEN 400 MG PO TABS: 400.0000 mg | ORAL_TABLET | Freq: Three times a day (TID) | ORAL | Status: DC | PRN

## 2018-02-17 MED ORDER — DOXEPIN HCL 10 MG/ML PO CONC
100.0000 mg | Freq: Every day | ORAL | Status: DC
  Filled 2018-02-17 (×2): qty 10

## 2018-02-17 MED ORDER — LORAZEPAM 2 MG/ML IJ SOLN
1.0000 mg | Freq: Once | INTRAMUSCULAR | Status: AC
  Administered 2018-02-17: 1 mg via INTRAMUSCULAR
  Filled 2018-02-17: qty 1

## 2018-02-17 MED ORDER — GLUCERNA SHAKE PO LIQD
237.0000 mL | Freq: Two times a day (BID) | ORAL | Status: DC
  Administered 2018-02-17: 237 mL via ORAL
  Filled 2018-02-17 (×2): qty 237

## 2018-02-17 MED ORDER — TRAZODONE HCL 50 MG PO TABS
150.0000 mg | ORAL_TABLET | Freq: Every day | ORAL | Status: DC
  Administered 2018-02-17: 150 mg via ORAL
  Filled 2018-02-17: qty 3

## 2018-02-17 MED ORDER — AMPHETAMINE-DEXTROAMPHETAMINE 10 MG PO TABS: 30.0000 mg | ORAL_TABLET | Freq: Every day | ORAL | Status: DC

## 2018-02-17 MED ORDER — INSULIN ASPART 100 UNIT/ML ~~LOC~~ SOLN: 6.0000 [IU] | Freq: Three times a day (TID) | SUBCUTANEOUS | Status: DC

## 2018-02-17 NOTE — ED Notes (Signed)
Patient given apple juice

## 2018-02-17 NOTE — ED Notes (Signed)
Patient pacing in room. Charged RN spoke with patient. Patient reports he wants sandwich and soda. RN advised could not have sandwich at this time. Patient given diet coke.

## 2018-02-17 NOTE — ED Notes (Signed)
BH reports that pt may be appropriate for placement at [facility] in [place]. Spoke with [place] at [facility] and gave report on pt wounds, and pt concern about being on probation and distance of facility. [place] reports she will call back to address concerns and see if patient is still appropriate.

## 2018-02-17 NOTE — ED Notes (Signed)
Patient became upset requesting he get 4 hamburgers and 4 sweet teas a side of green beans. Patient advised we could not do that we can give two. Patient became upset.

## 2018-02-17 NOTE — ED Notes (Signed)
Patient became upset, reports he is unable to sleep. Advised patient he has received trazodone and this nurse has given him ativan for anxiety. Patient requested the door be closed advised unable to close door. Patient began to curse. RN able to calm patient down. Patient now in room quite attempting to sleep.

## 2018-02-17 NOTE — ED Notes (Signed)
Meal tray ordered 

## 2018-02-17 NOTE — Progress Notes (Signed)
Per Nira Conn, NP pt is recommended for inpt treatment. Pt's nurse Marylene Land, RN and EDP Dr. Patria Mane, MD advised of the disposition.   [name], MSW, LCSW Therapeutic Triage Specialist  [phone]

## 2018-02-17 NOTE — ED Provider Notes (Signed)
[facility] EMERGENCY DEPARTMENT Provider Note   CSN: [id number] Arrival date & time: 02/16/18  2130     History   Chief Complaint Chief Complaint  Patient presents with  . wants to go to Mental hospital for stress    HPI [name] is a 32 y.o. male.  The history is provided by the patient and medical records.    32 year old male with history of anemia, anxiety, arthritis, depression, hypertension, diabetes, schizoaffective disorder, medication noncompliance, presenting to the ED requesting psychiatric admission.  Reports he has had multiple hospital admissions over the past several weeks and is very stressed out.  States he is not quite sure how he always ends up in the hospital as he "does not feel that sick".  States he feels like he goes to sleep and his roommate put something in him to make him go into DKA.  States he also feels like his roommate is stealing from him because he is black.  States he is generally not racist, but he states it seems to be "a black thing" with his roommate.  States he feels that all the circumstances around him are all coming together that are setting him up to fail at life.  He has not had any thoughts of hurting himself or others.  He denies any hallucinations.  No alcohol or illicit drug use.  States that he does get some help he is going to "go out of his mind".  Past Medical History:  Diagnosis Date  . Anemia   . Anxiety   . Arthritis    "both hips; both shoulders" (11/05/2017)  . Chicken pox   . Childhood asthma   . Chronic pain   . Depression   . DKA (diabetic ketoacidoses) ([name]) 11/18/2014  . DKA (diabetic ketoacidoses) ([name]) 07/05/2017  . GERD (gastroesophageal reflux disease)   . Hypertension   . Migraine    "a few/year" (06/06/2017)  . Noncompliance with medication regimen   . Pneumonia    "several times" (06/06/2017)  . Polysubstance abuse ([name])   . Schizo affective schizophrenia ([name])   . Scoliosis   .  Type I diabetes mellitus [facility])     Patient Active Problem List   Diagnosis Date Noted  . Retained dental root 01/31/2018  . 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 Malnutrition of moderate degree 01/29/2018  . Ankle ulcer ([name])   . Lung nodules   . Altered mental status   . Pressure injury of skin 01/22/2018  . Endotracheally intubated   . Increased anion gap metabolic acidosis   . Metabolic encephalopathy   . Vision loss 12/10/2017  . DKA (diabetic ketoacidosis) ([name]) 11/03/2017  . Pressure injury of contiguous region involving back and buttock, stage 1 10/11/2017  . Encephalopathy acute   . Hyperbilirubinemia 09/14/2017  . Heel ulcer ([name]) 09/14/2017  . Noncompliance with medication regimen   . Hypokalemia   . Non healing left heel wound   . Cellulitis   . DKA, type 1 ([name]) 08/17/2017  . Encounter for imaging study to confirm orogastric (OG) tube placement   . Acute respiratory acidosis   . DKA (diabetic ketoacidoses) ([name]) 08/04/2017  . Respiratory failure ([name])   . Seizure ([name])   . Diabetic ketoacidosis ([name]) 07/30/2017  . Normocytic anemia 07/05/2017  . Numbness of right hand 07/05/2017  . Elevated troponin I measurement   . Positive D dimer   . DKA, type 1, not at goal [facility]) 07/01/2017  . Hyperkalemia   .  Acute kidney injury ([name])   . Medically noncompliant   . Hyperglycemia 06/16/2017  . Nausea & vomiting 06/15/2017  . Abdominal pain 06/15/2017  . Sepsis ([name]) 06/07/2017  . Chest pain 06/07/2017  . GERD (gastroesophageal reflux disease) 06/07/2017  . Substance abuse ([name]) 05/30/2016  . Substance induced mood disorder ([name]) 05/30/2016  . Tobacco use disorder 12/24/2015  . DM hyperosmolarity type I, uncontrolled ([name]) 12/19/2015  . Generalized headache 07/22/2015  . Bilateral hip bursitis 05/26/2015  . Depression   . Generalized anxiety disorder 03/29/2015  . Undifferentiated schizophrenia ([name])   . Hip pain, bilateral 11/18/2014  . Dehydration 06/17/2012     Past Surgical History:  Procedure Laterality Date  . CARDIAC CATHETERIZATION  11/2016  . INCISION AND DRAINAGE ABSCESS Left 11/2011   "MRSA removed off my thumb"  . MULTIPLE EXTRACTIONS WITH ALVEOLOPLASTY N/A 01/31/2018   Procedure: Extraction of tooth #'s 2,3,5-12, 14-16, 17, 19-30 and 32 with alveoloplasty and bilateral mandibular lingaul tori reductions.;  Surgeon: Charlynne Pander, DDS;  Location: [facility] OR;  Service: Oral Surgery;  Laterality: N/A;  . TEE WITHOUT CARDIOVERSION N/A 01/28/2018   Procedure: TRANSESOPHAGEAL ECHOCARDIOGRAM (TEE);  Surgeon: Jake Bathe, MD;  Location: [facility] ENDOSCOPY;  Service: Cardiovascular;  Laterality: N/A;       Home Medications    Prior to Admission medications   Medication Sig Start Date End Date Taking? Authorizing Provider  amphetamine-dextroamphetamine (ADDERALL) 30 MG tablet Take 30 mg daily by mouth. 10/02/17   [provider]  chlorhexidine (PERIDEX) 0.12 % solution 15 mLs by Mouth Rinse route 2 (two) times daily. 02/02/18   Arrien, York Ram, MD  doxepin [facility]) 10 MG/ML solution Take 100 mg by mouth at bedtime.  07/11/17   [provider]  feeding supplement, GLUCERNA SHAKE, (GLUCERNA SHAKE) LIQD Take 237 mLs by mouth 2 (two) times daily between meals. 02/02/18 03/04/18  Arrien, York Ram, MD  ibuprofen (ADVIL,MOTRIN) 400 MG tablet Take 1 tablet (400 mg total) by mouth every 8 (eight) hours as needed for moderate pain. 02/02/18   Arrien, York Ram, MD  insulin aspart (NOVOLOG FLEXPEN) 100 UNIT/ML FlexPen Inject 0-15 Units into the skin See admin instructions. Check Blood Sugar 4 times per day > with meals and at bedtime CBG 70 - 120: 0 units CBG 121 - 150: 2 units CBG 151 - 200: 3 units CBG 201 - 250: 5 units CBG 251 - 300: 8 units CBG 301 - 350: 11 units CBG 351 - 400: 15 units 12/12/17   Rai, Ripudeep K, MD  insulin aspart (NOVOLOG) 100 UNIT/ML injection Take 6 units three times daily just before  meals. 01/17/18 01/17/19  Alwyn Ren, MD  Insulin Glargine (TOUJEO SOLOSTAR) 300 UNIT/ML SOPN Inject 30 units of lipase into the skin daily.    [provider]  oxyCODONE-acetaminophen (PERCOCET/ROXICET) 5-325 MG tablet Take 1 tablet by mouth every 6 (six) hours as needed for severe pain. 02/02/18   Arrien, York Ram, MD  pantoprazole (PROTONIX) 40 MG tablet Take 1 tablet (40 mg total) by mouth 2 (two) times daily before a meal. 12/12/17   Rai, Ripudeep K, MD  sennosides-docusate sodium (SENOKOT-S) 8.6-50 MG tablet Take 1 tablet by mouth as needed for constipation.    [provider]  traZODone (DESYREL) 150 MG tablet Take 150 mg by mouth at bedtime. 06/26/17   [provider]  sertraline (ZOLOFT) 50 MG tablet Take 50 mg by mouth daily. For depression. Just started med, have not picked  up yet [facility] [address] 02/22/12 03/08/12  Viviann Spare, FNP    Family History Family History  Problem Relation Age of Onset  . Diabetes Mother     Social History Social History   Tobacco Use  . Smoking status: Current Every Day Smoker    Packs/day: 0.50    Years: 9.00    Pack years: 4.50    Types: Cigarettes  . Smokeless tobacco: Never Used  Substance Use Topics  . Alcohol use: No  . Drug use: Yes    Types: Marijuana    Comment: 11/05/2017 "none since 2012"; pt denies hx of cocaine and methamphetamines use on 11/05/2017     Allergies   Sulfa antibiotics and Fentanyl   Review of Systems Review of Systems  Psychiatric/Behavioral: Positive for behavioral problems.       Stress  All other systems reviewed and are negative.    Physical Exam Updated Vital Signs BP 127/87 (BP Location: Left Arm)   Pulse (!) 108   Temp 98.5 F (36.9 C) (Oral)   Resp 16   Ht 6\' 1"  (1.854 m)   Wt 61.2 kg (135 lb)   SpO2 97%   BMI 17.81 kg/m   Physical Exam  Constitutional: He is oriented to person, place, and time. He appears well-developed and  well-nourished.  HENT:  Head: Normocephalic and atraumatic.  Mouth/Throat: Oropharynx is clear and moist.  Voice is hoarse (prior intubations)  Eyes: Conjunctivae and EOM are normal. Pupils are equal, round, and reactive to light.  Neck: Normal range of motion.  Cardiovascular: Normal rate, regular rhythm and normal heart sounds.  Pulmonary/Chest: Effort normal and breath sounds normal. No stridor. No respiratory distress.  Abdominal: Soft. Bowel sounds are normal. There is no tenderness. There is no rebound.  Musculoskeletal: Normal range of motion.  Neurological: He is alert and oriented to person, place, and time.  Skin: Skin is warm and dry.  Psychiatric: His mood appears anxious.  Appears anxious and paranoid, looking around the room and at the door multiple times; lowering his voice intermittently during exam Denies SI/HI/AVH  Nursing note and vitals reviewed.    ED Treatments / Results  Labs (all labs ordered are listed, but only abnormal results are displayed) Labs Reviewed  COMPREHENSIVE METABOLIC PANEL - Abnormal; Notable for the following components:      Result Value   Potassium 3.4 (*)    Chloride 98 (*)    Glucose, Bld 158 (*)    Albumin 3.1 (*)    ALT 9 (*)    All other components within normal limits  CBC - Abnormal; Notable for the following components:   WBC 15.5 (*)    RBC 3.32 (*)    Hemoglobin 9.6 (*)    HCT 28.4 (*)    Platelets 445 (*)    All other components within normal limits  RAPID URINE DRUG SCREEN, HOSP PERFORMED - Abnormal; Notable for the following components:   Tetrahydrocannabinol POSITIVE (*)    All other components within normal limits  ETHANOL    EKG  EKG Interpretation None       Radiology No results found.  Procedures Procedures (including critical care time)  Medications Ordered in ED Medications - No data to display   Initial Impression / Assessment and Plan / ED Course  I have reviewed the triage vital signs and  the nursing notes.  Pertinent labs & imaging results that were available during my care of the patient were reviewed  by me and considered in my medical decision making (see chart for details).  32 year old male presenting to the ED requesting psychiatric admission.  States he has been under significant amount of stress lately due to his ongoing medical issues and recent hospitalizations.  When talking with him, it seems he is somewhat delusional and paranoid as he feels like people are giving him things in his sleep to make him going to DKA.  He also seems to have an issue with his roommate and describes it as a "black thing".  On exam he does seem somewhat anxious and paranoid, checking the door and around the room multiple times just while talking to him.  He denies any suicidal homicidal ideation.  No apparent hallucinations.  Screening labs are overall reassuring, his glucose is actually fairly well controlled today at 158.  His UDS is positive for THC.  Patient can be medically cleared at this time.  TTS to evaluate.  TTS has evaluated, recommends inpatient placement.  His home meds have been ordered.  We will continue monitoring his glucose closely given his history of recurrent DKA.  He was given dose of ativan at his request for his "nerves".  Final Clinical Impressions(s) / ED Diagnoses   Final diagnoses:  Anxiety    ED Discharge Orders    None       Garlon Hatchet, PA-C 02/17/18 0622    Ward, Layla Maw, DO 02/17/18 [phone]

## 2018-02-17 NOTE — Progress Notes (Signed)
Pt has been accepted to [facility] per [place] in Admissions. He may arrive at any time. Accepting MD: Dr. Erling Conte. Number for report: [phone]. Asher Muir RN notified.   Trula Slade, MSW, LCSW Clinical Social Worker 02/17/2018 1:32 PM

## 2018-02-17 NOTE — ED Notes (Signed)
Called pharmacy about other prescriptions.

## 2018-02-17 NOTE — Progress Notes (Signed)
TTS spoke with Marylene Land, RN who requested 10 mins to set up telepsych cart.  Princess Bruins, MSW, LCSW Therapeutic Triage Specialist  [phone]

## 2018-02-17 NOTE — Progress Notes (Signed)
Patient meets criteria for inpatient treatment. There are no beds available currently at [facility]. CSW manually faxed referrals to the following facilities for review.  [place], [place], [address], [place], [place], [place], [address], [place], [place], [place], and [place].   TTS will continue to seek bed placement.     Moss Mc, MSW, LCSW-A, LCAS-A 02/17/2018 9:49 AM
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
## 2018-02-17 NOTE — Discharge Instructions (Signed)
Transfer to [facility].  Decubitus precautions - avoid any pressure on left heel and sacral area.  Stay up/active as much as possible. If/when in bed or chair, rotate frequently from left to right, avoid any prolonged pressure on sacral area.

## 2018-02-17 NOTE — ED Notes (Signed)
Patient eating breakfast at this time.

## 2018-02-17 NOTE — ED Notes (Signed)
Patient used first phone call.

## 2018-02-17 NOTE — ED Notes (Signed)
Pharmacy reports will send additional medications.

## 2018-02-17 NOTE — ED Notes (Signed)
Pt received breakfast tray 

## 2018-02-17 NOTE — ED Notes (Signed)
Patient refused snack.  

## 2018-03-18 DEATH — deceased

## 2018-05-08 ENCOUNTER — Telehealth (HOSPITAL_COMMUNITY): Payer: Self-pay

## 2018-05-25 ENCOUNTER — Ambulatory Visit (HOSPITAL_COMMUNITY): Payer: Self-pay | Admitting: Psychiatry

## 2019-06-23 IMAGING — CT CT HEAD W/O CM
3 series · 16 of 47 positions shown, 19 images · non-contrast
Comparison: CT 10/20/2017.  MRI 12/10/2017.

CLINICAL DATA: Altered level of consciousness.

EXAM:
CT HEAD WITHOUT CONTRAST
TECHNIQUE: Contiguous axial images were obtained from the base of the skull
through the vertex without intravenous contrast.

[Series 2: head wo · axial · 0.44mm/px · z∈[+1657,+1797]mm · 10 of 34 slices shown, 13 images]
[im 3/34  brain]
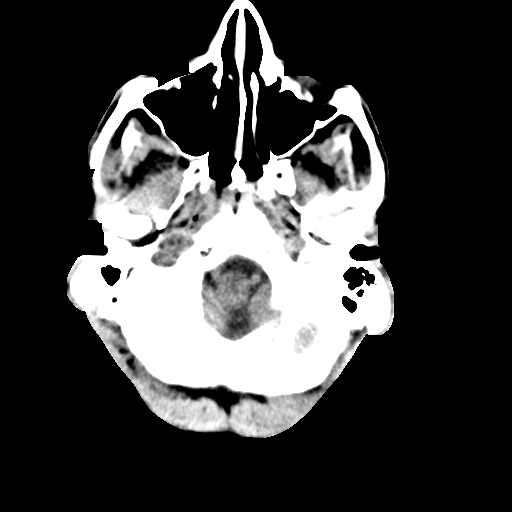
[im 3/34  bone]
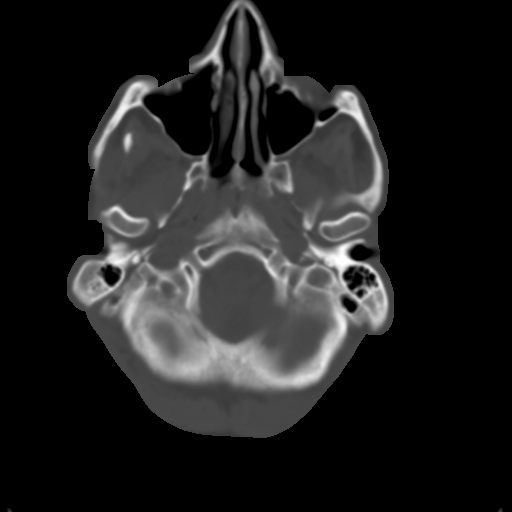
[im 6/34  brain]
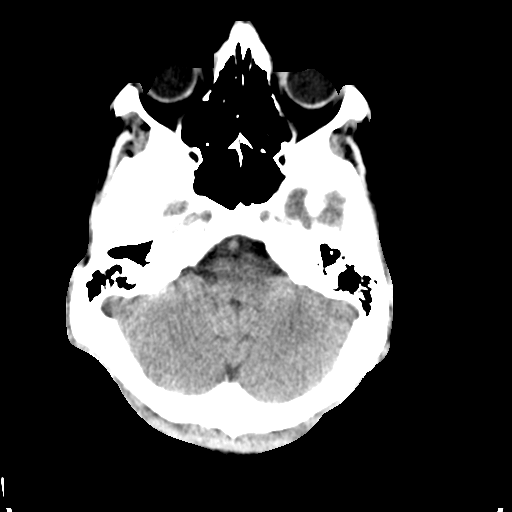
[im 10/34  brain]
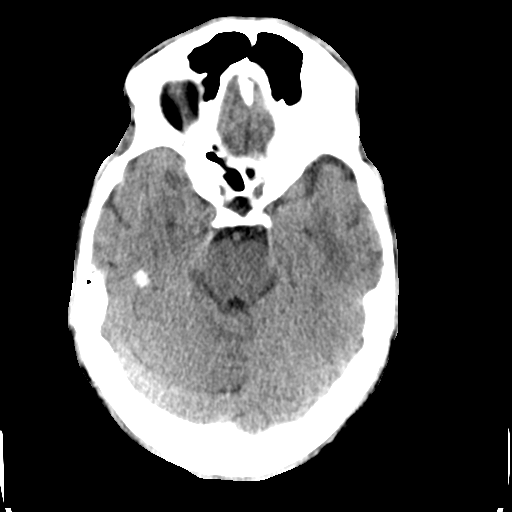
[im 12/34  brain]
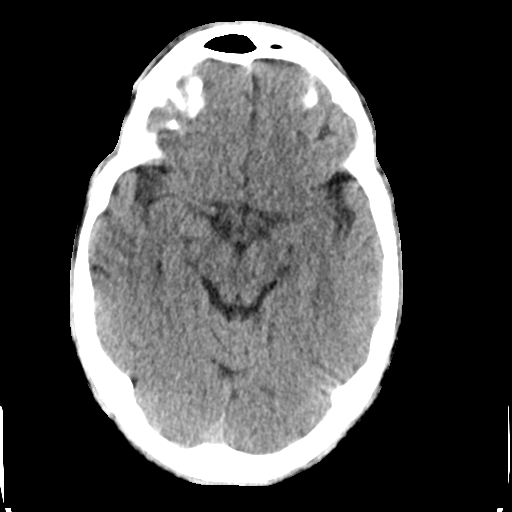
[im 15/34  brain]
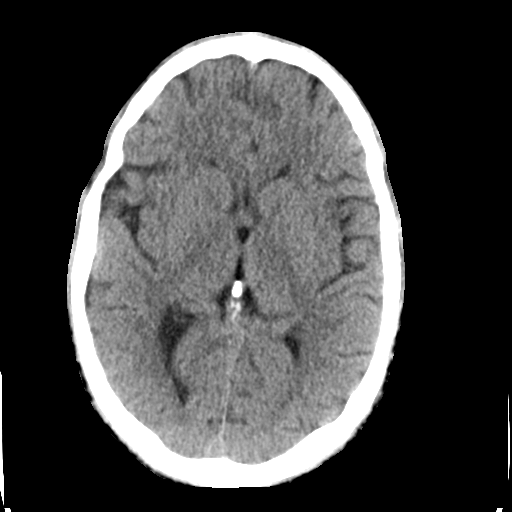
[im 15/34  bone]
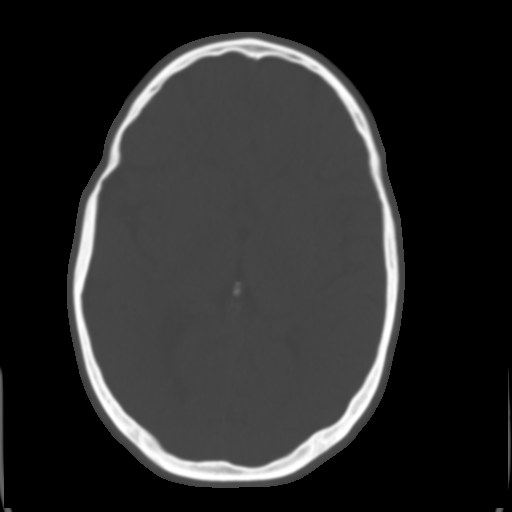
[im 19/34  brain]
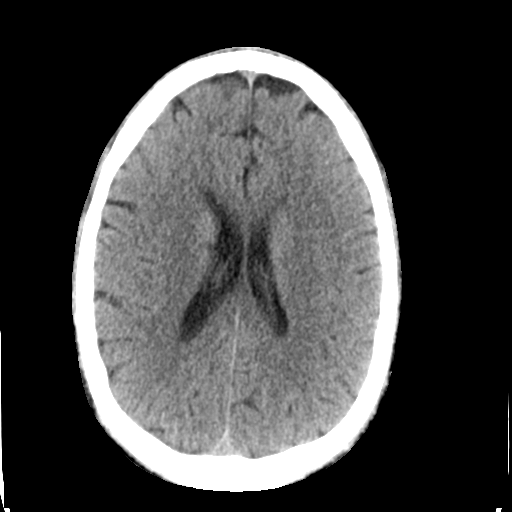
[im 22/34  brain]
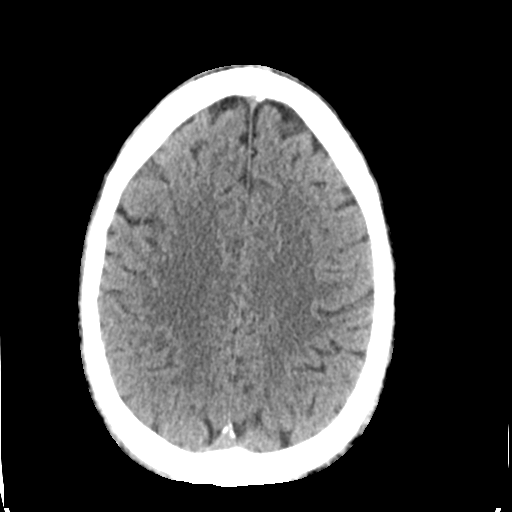
[im 26/34  brain]
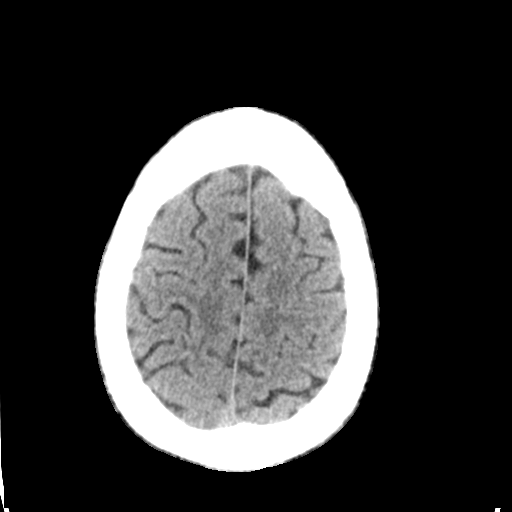
[im 28/34  brain]
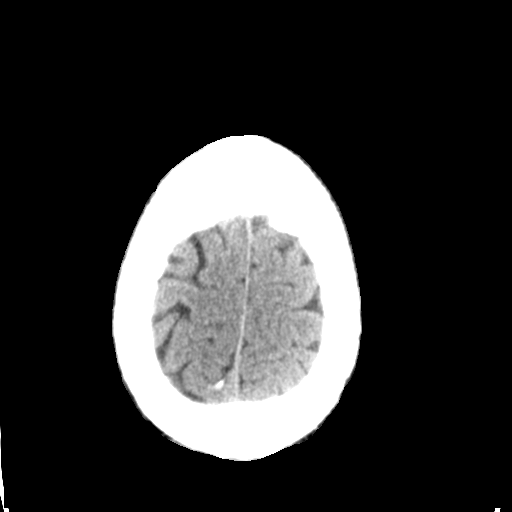
[im 28/34  bone]
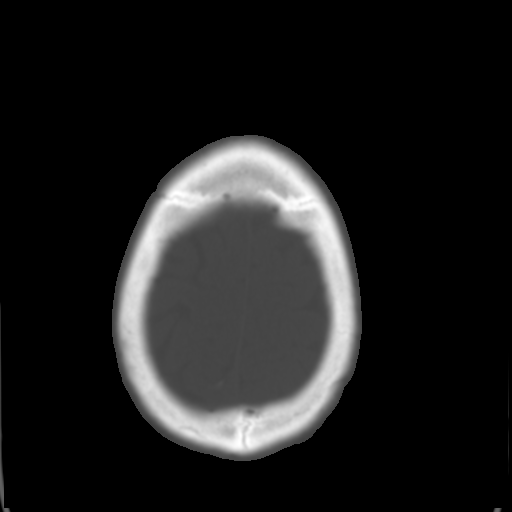
[im 31/34  brain]
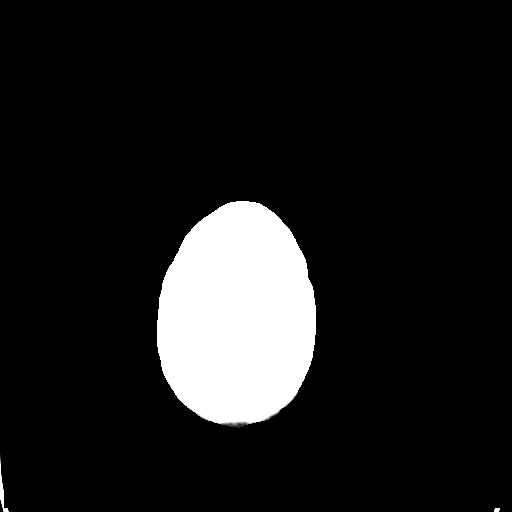

[Series 4: coronal soft tissue · coronal · 0.31mm/px · 3 of 74 slices shown]
[im 25/74  brain]
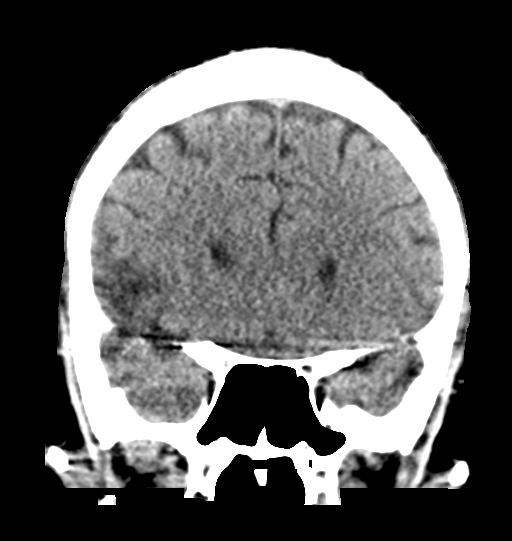
[im 33/74  brain]
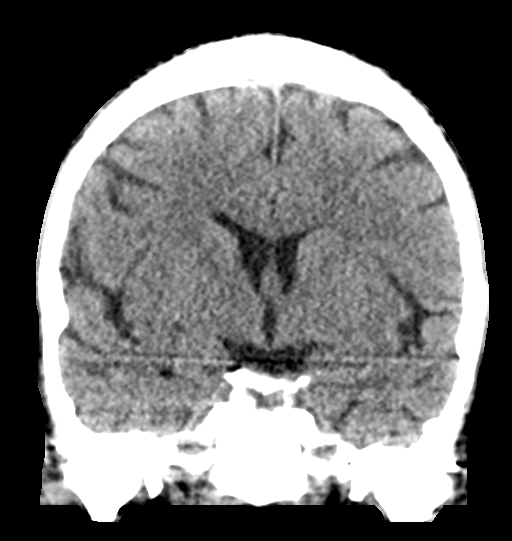
[im 41/74  brain]
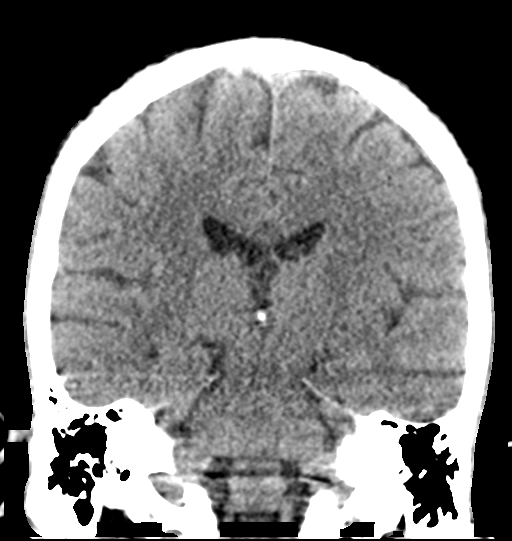

[Series 5: sagittal soft tissue · sagittal · 0.33mm/px · 3 of 54 slices shown]
[im 18/54  brain]
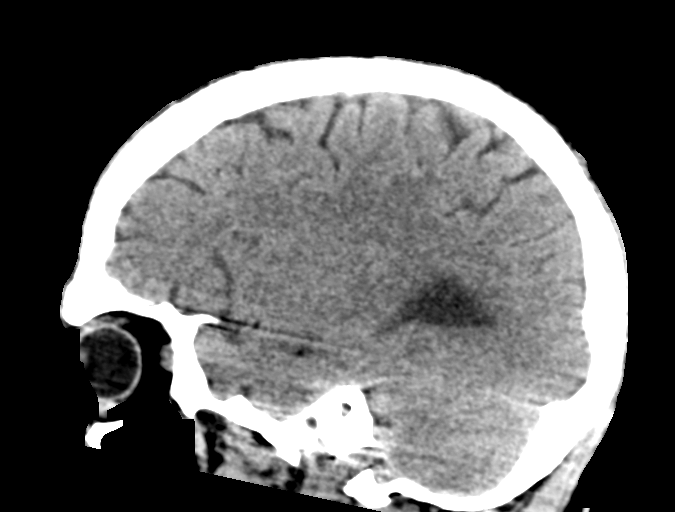
[im 27/54  brain]
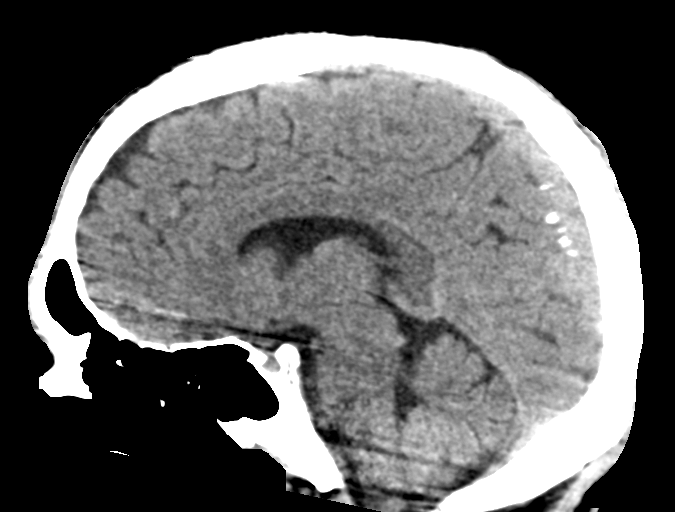
[im 36/54  brain]
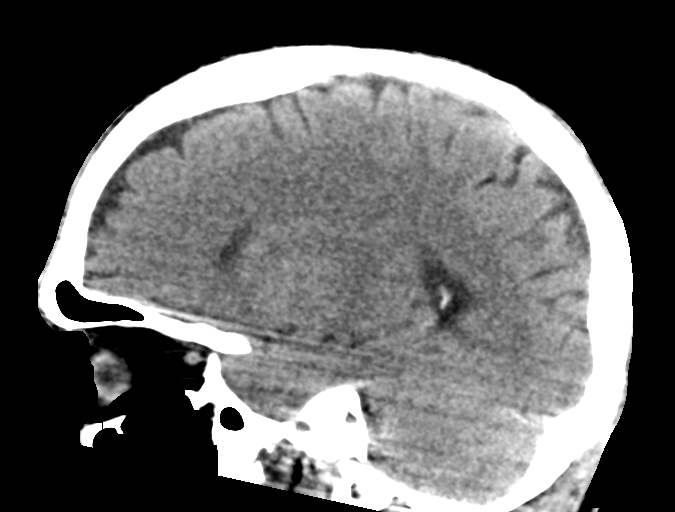

[16 of 47 positions shown; findings below may reference images not displayed]

FINDINGS: Brain: No acute intracranial abnormality. Specifically, no
hemorrhage, hydrocephalus, mass lesion, acute infarction, or
significant intracranial injury.

Vascular: No hyperdense vessel or unexpected calcification.

Skull: No acute calvarial abnormality.

Sinuses/Orbits: Visualized paranasal sinuses and mastoids clear.
Orbital soft tissues unremarkable.

Other: None
IMPRESSION: No acute intracranial abnormality.

## 2019-07-04 IMAGING — DX DG FOOT COMPLETE 3+V*L*
3 series · 3 of 3 positions shown · non-contrast
Comparison: 08/17/2017

CLINICAL DATA: Foot ulcer

EXAM:
LEFT FOOT - COMPLETE 3+ VIEW

[foot ap]
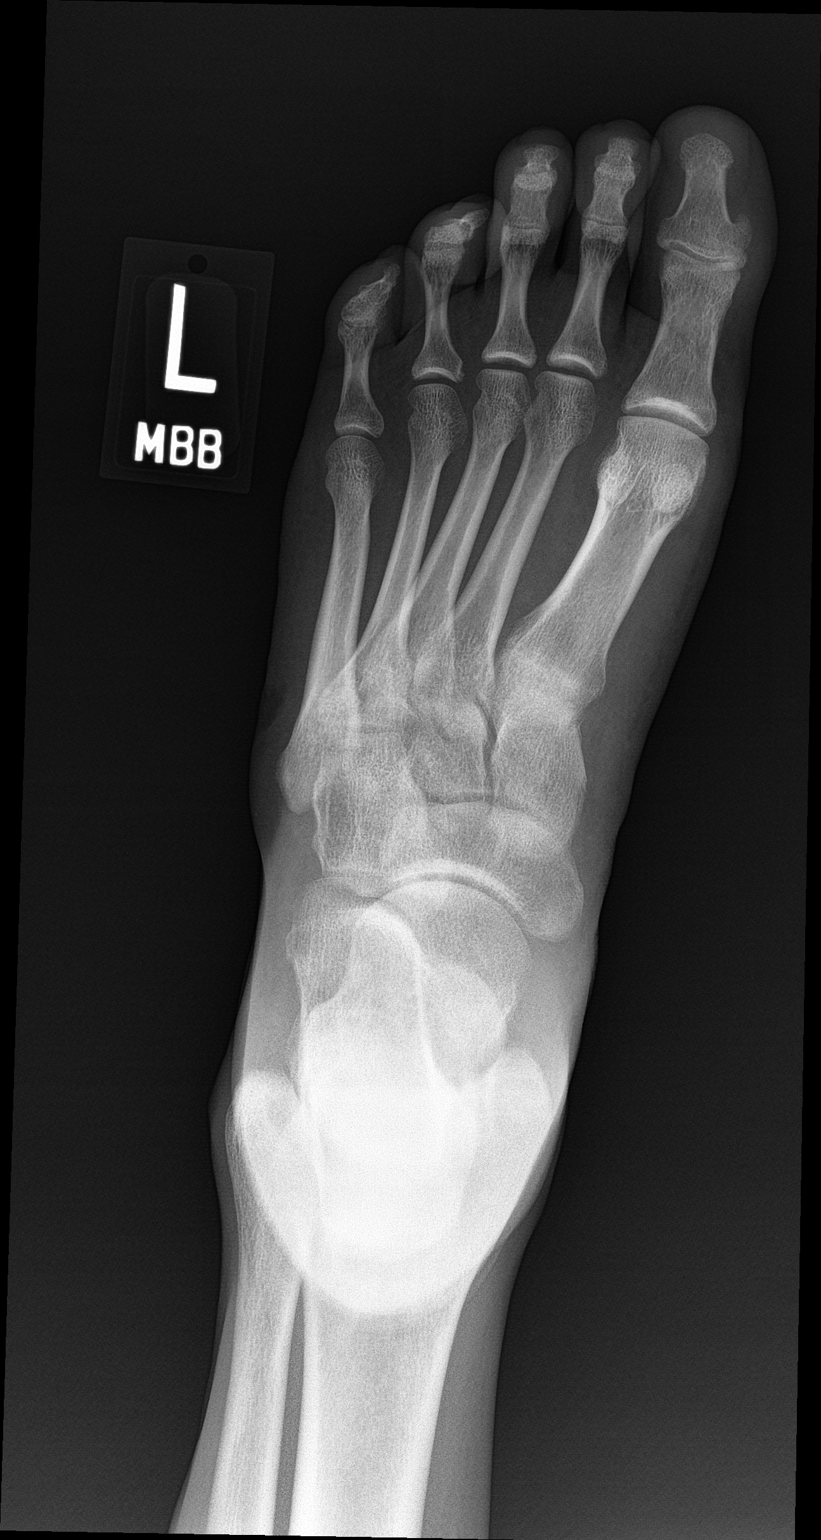

[foot obl]
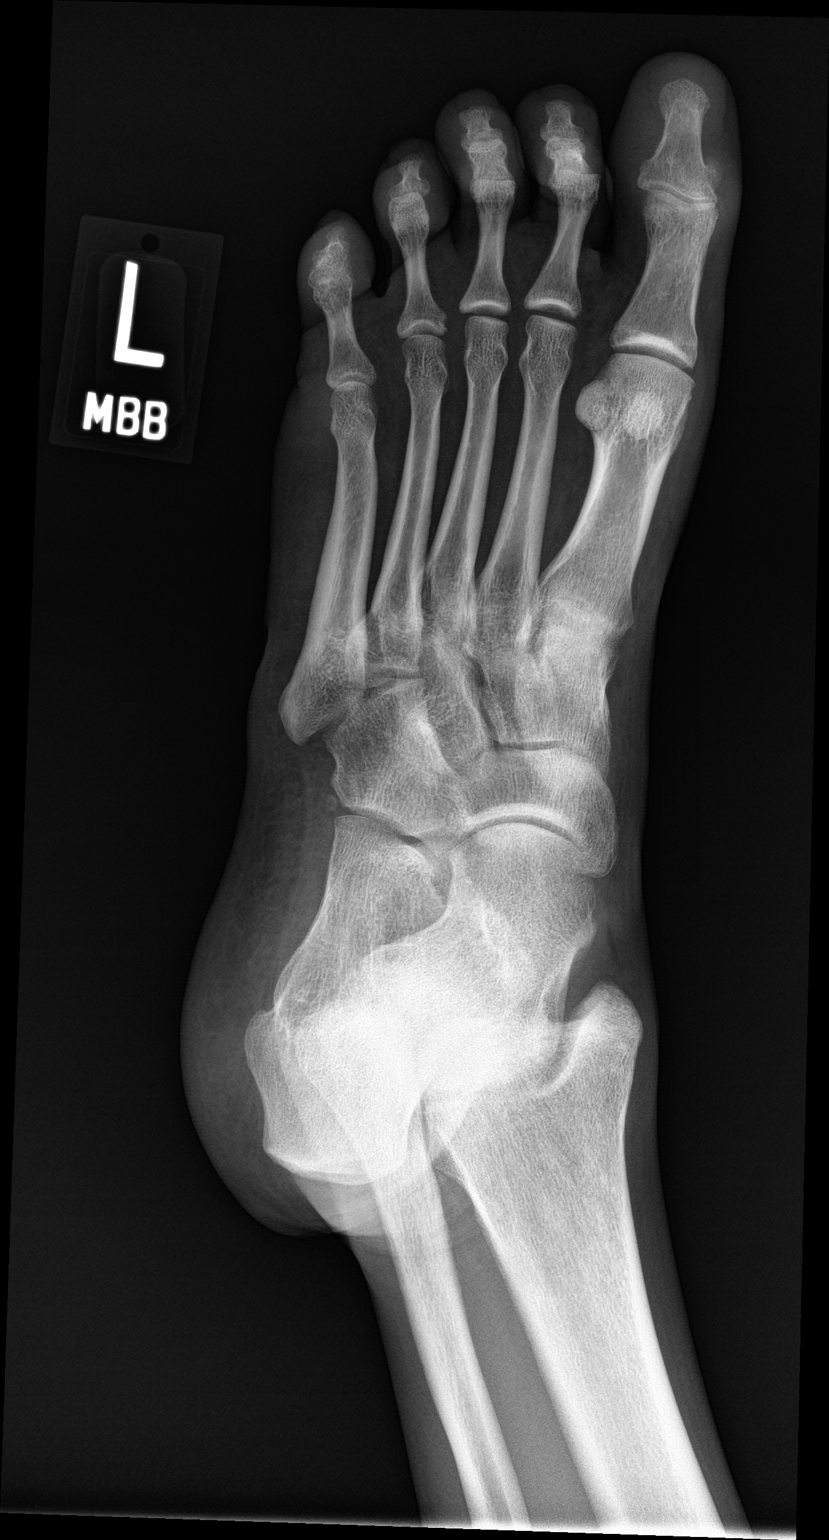

[foot lat]
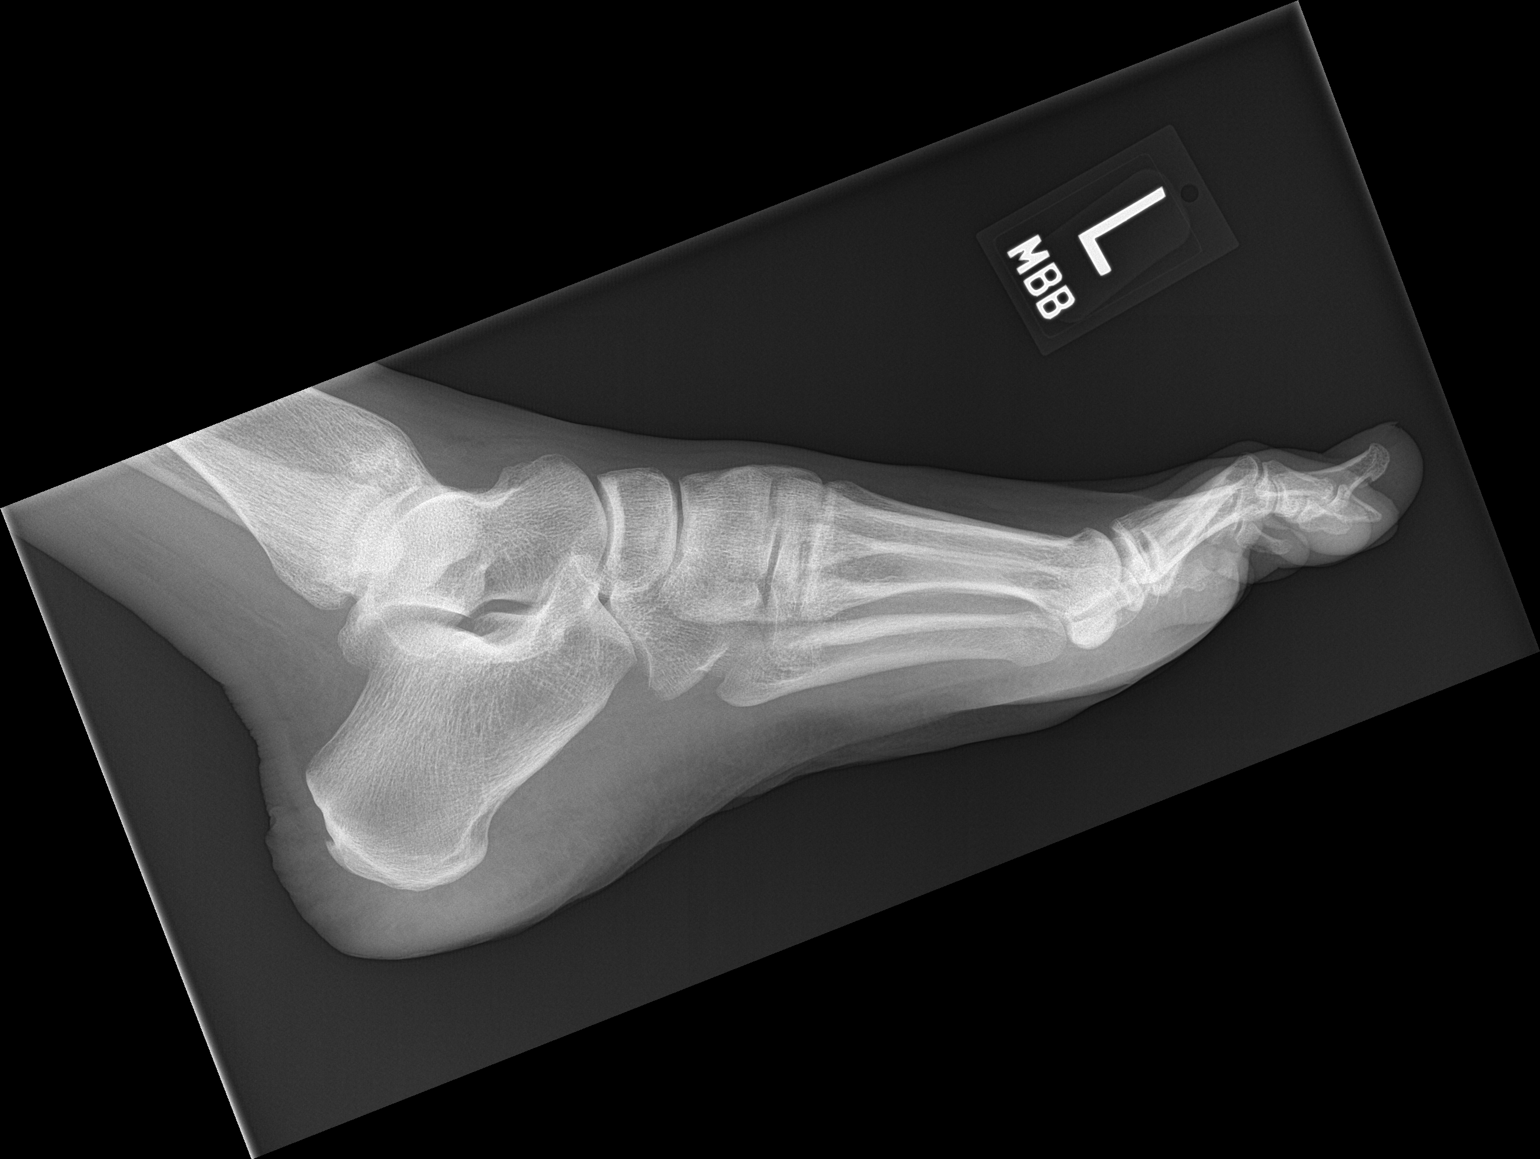

[3 of 3 positions shown; findings below may reference images not displayed]

FINDINGS: There is no evidence of fracture or dislocation. There is no
evidence of arthropathy or other focal bone abnormality. Soft
tissues are unremarkable.
IMPRESSION: No acute abnormality is noted.

## 2019-07-07 IMAGING — PX DG ORTHOPANTOGRAM /PANORAMIC
1 series · 1 of 1 positions shown · non-contrast
Comparison: None.

ADDENDUM:
Improved panoramic view of the mandible was obtained on 01/28/2018
and compared with that obtained on 01/25/2018. Again varies dental
caries are noted throughout the mandibular and maxillary teeth.
Periapical lucencies are again noted at the level of the left
maxillary canine, left maxillary lateral incisor and right maxillary
lateral incisor. No other definitive periapical lucencies are seen.
CLINICAL DATA: Evaluate for possible dental abscess

EXAM:
ORTHOPANTOGRAM/PANORAMIC

[Series 1: — · B · 1 of 1 slices shown]
[im 1/1]
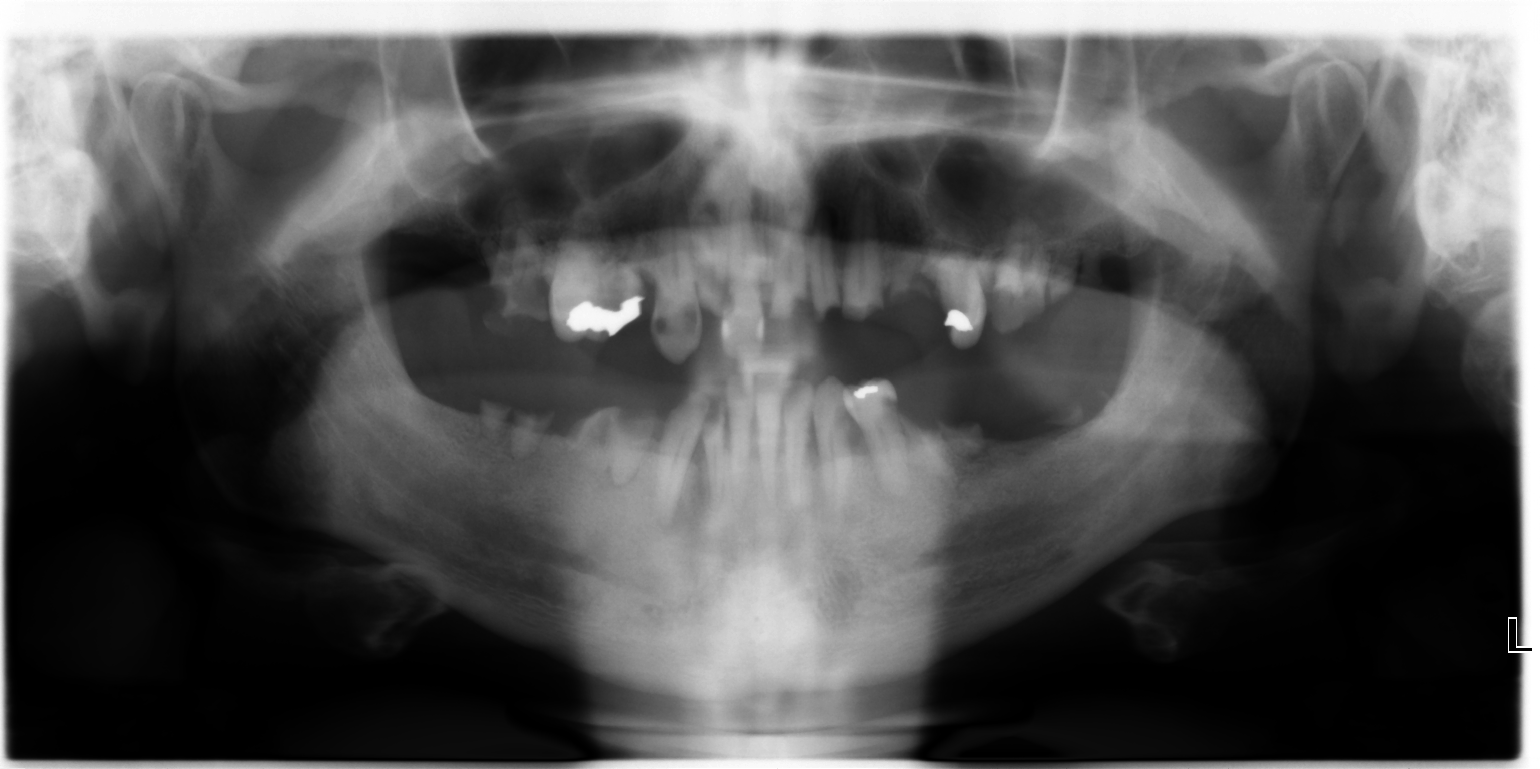

[1 of 1 positions shown; findings below may reference images not displayed]

FINDINGS: Mandible is intact. No acute fracture is seen. Diffuse dental caries
are identified. Significant motion artifact is identified which
somewhat limits the exam. Suspicious areas of lucency are noted
surrounding the remaining left incisors in the maxilla and mandible.
IMPRESSION: Diffuse dental caries.

Motion artifact limits the exam significantly. Suspicious areas of
lucency are noted as described above. Further evaluation by CT the
maxillofacial bones is recommended.
